# Patient Record
Sex: Male | Born: 1945
Health system: Southern US, Community
[De-identification: ages and names within clinical notes are randomized; demographics above are authoritative.]

## PROBLEM LIST (undated history)

## (undated) DIAGNOSIS — N200 Calculus of kidney: Secondary | ICD-10-CM

## (undated) DIAGNOSIS — E119 Type 2 diabetes mellitus without complications: Secondary | ICD-10-CM

## (undated) DIAGNOSIS — M545 Low back pain, unspecified: Secondary | ICD-10-CM

## (undated) DIAGNOSIS — M160 Bilateral primary osteoarthritis of hip: Secondary | ICD-10-CM

## (undated) DIAGNOSIS — N189 Chronic kidney disease, unspecified: Secondary | ICD-10-CM

## (undated) DIAGNOSIS — C801 Malignant (primary) neoplasm, unspecified: Secondary | ICD-10-CM

## (undated) DIAGNOSIS — IMO0001 Reserved for inherently not codable concepts without codable children: Secondary | ICD-10-CM

## (undated) DIAGNOSIS — N529 Male erectile dysfunction, unspecified: Secondary | ICD-10-CM

## (undated) DIAGNOSIS — I639 Cerebral infarction, unspecified: Secondary | ICD-10-CM

## (undated) DIAGNOSIS — D369 Benign neoplasm, unspecified site: Secondary | ICD-10-CM

## (undated) DIAGNOSIS — G8929 Other chronic pain: Secondary | ICD-10-CM

## (undated) DIAGNOSIS — J449 Chronic obstructive pulmonary disease, unspecified: Secondary | ICD-10-CM

## (undated) DIAGNOSIS — I480 Paroxysmal atrial fibrillation: Secondary | ICD-10-CM

## (undated) DIAGNOSIS — Z8546 Personal history of malignant neoplasm of prostate: Secondary | ICD-10-CM

## (undated) DIAGNOSIS — G629 Polyneuropathy, unspecified: Secondary | ICD-10-CM

## (undated) DIAGNOSIS — K5732 Diverticulitis of large intestine without perforation or abscess without bleeding: Secondary | ICD-10-CM

## (undated) DIAGNOSIS — E785 Hyperlipidemia, unspecified: Secondary | ICD-10-CM

## (undated) DIAGNOSIS — I872 Venous insufficiency (chronic) (peripheral): Secondary | ICD-10-CM

## (undated) DIAGNOSIS — N400 Enlarged prostate without lower urinary tract symptoms: Secondary | ICD-10-CM

## (undated) DIAGNOSIS — F1021 Alcohol dependence, in remission: Secondary | ICD-10-CM

## (undated) DIAGNOSIS — N281 Cyst of kidney, acquired: Secondary | ICD-10-CM

## (undated) DIAGNOSIS — K635 Polyp of colon: Secondary | ICD-10-CM

## (undated) DIAGNOSIS — R209 Unspecified disturbances of skin sensation: Secondary | ICD-10-CM

## (undated) DIAGNOSIS — R7302 Impaired glucose tolerance (oral): Secondary | ICD-10-CM

## (undated) DIAGNOSIS — M47817 Spondylosis without myelopathy or radiculopathy, lumbosacral region: Secondary | ICD-10-CM

## (undated) DIAGNOSIS — E1140 Type 2 diabetes mellitus with diabetic neuropathy, unspecified: Secondary | ICD-10-CM

## (undated) DIAGNOSIS — I10 Essential (primary) hypertension: Secondary | ICD-10-CM

## (undated) DIAGNOSIS — Z8679 Personal history of other diseases of the circulatory system: Secondary | ICD-10-CM

## (undated) HISTORY — DX: Type 2 diabetes mellitus without complications: E11.9

## (undated) HISTORY — DX: Benign neoplasm, unspecified site: D36.9

## (undated) HISTORY — DX: Low back pain: M54.5

## (undated) HISTORY — DX: Hyperlipidemia, unspecified: E78.5

## (undated) HISTORY — DX: Diverticulitis of large intestine without perforation or abscess without bleeding: K57.32

## (undated) HISTORY — DX: Polyneuropathy, unspecified: G62.9

## (undated) HISTORY — DX: Bilateral primary osteoarthritis of hip: M16.0

## (undated) HISTORY — DX: Benign prostatic hyperplasia without lower urinary tract symptoms: N40.0

## (undated) HISTORY — DX: Other chronic pain: G89.29

## (undated) HISTORY — DX: Polyp of colon: K63.5

## (undated) HISTORY — DX: Calculus of kidney: N20.0

## (undated) HISTORY — PX: PROSTATECTOMY: SHX69

## (undated) HISTORY — DX: Personal history of other diseases of the circulatory system: Z86.79

## (undated) HISTORY — DX: Essential (primary) hypertension: I10

## (undated) HISTORY — DX: Alcohol dependence, in remission: F10.21

## (undated) HISTORY — DX: Cyst of kidney, acquired: N28.1

## (undated) HISTORY — DX: Impaired glucose tolerance (oral): R73.02

## (undated) HISTORY — DX: Type 2 diabetes mellitus with diabetic neuropathy, unspecified: E11.40

## (undated) HISTORY — DX: Male erectile dysfunction, unspecified: N52.9

## (undated) HISTORY — DX: Unspecified disturbances of skin sensation: R20.9

## (undated) HISTORY — DX: Chronic obstructive pulmonary disease, unspecified: J44.9

## (undated) HISTORY — DX: Personal history of malignant neoplasm of prostate: Z85.46

## (undated) HISTORY — DX: Paroxysmal atrial fibrillation: I48.0

## (undated) HISTORY — DX: Reserved for inherently not codable concepts without codable children: IMO0001

## (undated) HISTORY — DX: Low back pain, unspecified: M54.50

## (undated) HISTORY — DX: Spondylosis without myelopathy or radiculopathy, lumbosacral region: M47.817

---

## 1998-01-24 ENCOUNTER — Ambulatory Visit (HOSPITAL_COMMUNITY): Admission: RE | Admit: 1998-01-24 | Discharge: 1998-01-24 | Payer: Self-pay | Admitting: Internal Medicine

## 1998-11-12 ENCOUNTER — Encounter: Payer: Self-pay | Admitting: Internal Medicine

## 1998-11-12 ENCOUNTER — Ambulatory Visit (HOSPITAL_COMMUNITY): Admission: RE | Admit: 1998-11-12 | Discharge: 1998-11-12 | Payer: Self-pay | Admitting: Internal Medicine

## 1999-12-27 ENCOUNTER — Encounter: Payer: Self-pay | Admitting: Internal Medicine

## 1999-12-27 ENCOUNTER — Ambulatory Visit (HOSPITAL_COMMUNITY): Admission: RE | Admit: 1999-12-27 | Discharge: 1999-12-27 | Payer: Self-pay | Admitting: Internal Medicine

## 1999-12-28 ENCOUNTER — Emergency Department (HOSPITAL_COMMUNITY): Admission: EM | Admit: 1999-12-28 | Discharge: 1999-12-28 | Payer: Self-pay | Admitting: Emergency Medicine

## 2000-01-02 ENCOUNTER — Encounter: Payer: Self-pay | Admitting: Urology

## 2000-01-02 ENCOUNTER — Ambulatory Visit (HOSPITAL_COMMUNITY): Admission: RE | Admit: 2000-01-02 | Discharge: 2000-01-02 | Payer: Self-pay | Admitting: Urology

## 2001-02-23 ENCOUNTER — Encounter: Payer: Self-pay | Admitting: Neurosurgery

## 2001-02-23 ENCOUNTER — Encounter: Admission: RE | Admit: 2001-02-23 | Discharge: 2001-02-23 | Payer: Self-pay | Admitting: Neurosurgery

## 2001-03-08 ENCOUNTER — Encounter: Payer: Self-pay | Admitting: Neurosurgery

## 2001-03-08 ENCOUNTER — Encounter: Admission: RE | Admit: 2001-03-08 | Discharge: 2001-03-08 | Payer: Self-pay | Admitting: Neurosurgery

## 2001-03-23 ENCOUNTER — Encounter: Payer: Self-pay | Admitting: Neurosurgery

## 2001-03-23 ENCOUNTER — Encounter: Admission: RE | Admit: 2001-03-23 | Discharge: 2001-03-23 | Payer: Self-pay | Admitting: Neurosurgery

## 2001-07-21 HISTORY — PX: LEFT HEART CATH AND CORONARY ANGIOGRAPHY: CATH118249

## 2001-07-21 HISTORY — PX: CARDIAC CATHETERIZATION: SHX172

## 2001-09-21 ENCOUNTER — Encounter: Payer: Self-pay | Admitting: Emergency Medicine

## 2001-09-21 ENCOUNTER — Inpatient Hospital Stay (HOSPITAL_COMMUNITY): Admission: EM | Admit: 2001-09-21 | Discharge: 2001-09-23 | Payer: Self-pay | Admitting: Emergency Medicine

## 2001-09-22 ENCOUNTER — Encounter: Payer: Self-pay | Admitting: Cardiovascular Disease

## 2004-05-27 ENCOUNTER — Ambulatory Visit: Payer: Self-pay | Admitting: Internal Medicine

## 2004-09-10 ENCOUNTER — Ambulatory Visit: Payer: Self-pay | Admitting: Internal Medicine

## 2004-10-04 ENCOUNTER — Encounter: Admission: RE | Admit: 2004-10-04 | Discharge: 2004-10-04 | Payer: Self-pay | Admitting: Neurosurgery

## 2005-05-20 ENCOUNTER — Ambulatory Visit: Payer: Self-pay | Admitting: Internal Medicine

## 2005-06-23 ENCOUNTER — Ambulatory Visit: Payer: Self-pay | Admitting: Pulmonary Disease

## 2005-07-03 ENCOUNTER — Ambulatory Visit (HOSPITAL_BASED_OUTPATIENT_CLINIC_OR_DEPARTMENT_OTHER): Admission: RE | Admit: 2005-07-03 | Discharge: 2005-07-03 | Payer: Self-pay | Admitting: Urology

## 2005-07-03 ENCOUNTER — Ambulatory Visit (HOSPITAL_COMMUNITY): Admission: RE | Admit: 2005-07-03 | Discharge: 2005-07-03 | Payer: Self-pay | Admitting: Urology

## 2005-07-03 ENCOUNTER — Encounter (INDEPENDENT_AMBULATORY_CARE_PROVIDER_SITE_OTHER): Payer: Self-pay | Admitting: Specialist

## 2005-09-17 ENCOUNTER — Inpatient Hospital Stay (HOSPITAL_COMMUNITY): Admission: RE | Admit: 2005-09-17 | Discharge: 2005-09-18 | Payer: Self-pay | Admitting: Urology

## 2005-09-17 ENCOUNTER — Encounter (INDEPENDENT_AMBULATORY_CARE_PROVIDER_SITE_OTHER): Payer: Self-pay | Admitting: Specialist

## 2006-04-18 ENCOUNTER — Emergency Department (HOSPITAL_COMMUNITY): Admission: EM | Admit: 2006-04-18 | Discharge: 2006-04-18 | Payer: Self-pay | Admitting: Emergency Medicine

## 2007-01-18 ENCOUNTER — Ambulatory Visit: Payer: Self-pay | Admitting: Internal Medicine

## 2007-01-18 LAB — CONVERTED CEMR LAB
ALT: 33 units/L (ref 0–53)
AST: 35 units/L (ref 0–37)
Albumin: 3.5 g/dL (ref 3.5–5.2)
Alkaline Phosphatase: 85 units/L (ref 39–117)
BUN: 9 mg/dL (ref 6–23)
Basophils Absolute: 0 10*3/uL (ref 0.0–0.1)
Basophils Relative: 0.7 % (ref 0.0–1.0)
Bilirubin Urine: NEGATIVE
Bilirubin, Direct: 0.1 mg/dL (ref 0.0–0.3)
CO2: 32 meq/L (ref 19–32)
Calcium: 9.2 mg/dL (ref 8.4–10.5)
Chloride: 109 meq/L (ref 96–112)
Cholesterol: 148 mg/dL (ref 0–200)
Creatinine, Ser: 1.1 mg/dL (ref 0.4–1.5)
Crystals: NEGATIVE
Eosinophils Absolute: 0.2 10*3/uL (ref 0.0–0.6)
Eosinophils Relative: 5.3 % — ABNORMAL HIGH (ref 0.0–5.0)
GFR calc Af Amer: 88 mL/min
GFR calc non Af Amer: 73 mL/min
Glucose, Bld: 108 mg/dL — ABNORMAL HIGH (ref 70–99)
HCT: 39.9 % (ref 39.0–52.0)
HDL: 41.4 mg/dL (ref >39.0)
Hemoglobin, Urine: NEGATIVE
Hemoglobin: 13.1 g/dL (ref 13.0–17.0)
Ketones, ur: NEGATIVE mg/dL
LDL Cholesterol: 95 mg/dL (ref 0–99)
Lymphocytes Relative: 37.3 % (ref 12.0–46.0)
MCHC: 32.9 g/dL (ref 30.0–36.0)
MCV: 86.7 fL (ref 78.0–100.0)
Monocytes Absolute: 0.7 10*3/uL (ref 0.2–0.7)
Monocytes Relative: 18.9 % — ABNORMAL HIGH (ref 3.0–11.0)
Mucus, UA: NEGATIVE
Neutro Abs: 1.3 10*3/uL — ABNORMAL LOW (ref 1.4–7.7)
Neutrophils Relative %: 37.8 % — ABNORMAL LOW (ref 43.0–77.0)
Nitrite: NEGATIVE
Platelets: 199 10*3/uL (ref 150–400)
Potassium: 4.4 meq/L (ref 3.5–5.1)
RBC / HPF: NONE SEEN
RBC: 4.6 M/uL (ref 4.22–5.81)
RDW: 13.4 % (ref 11.5–14.6)
Sodium: 142 meq/L (ref 135–145)
Specific Gravity, Urine: 1.015 (ref 1.000–1.03)
TSH: 0.82 microintl units/mL (ref 0.35–5.50)
Total Bilirubin: 0.6 mg/dL (ref 0.3–1.2)
Total CHOL/HDL Ratio: 3.6
Total Protein, Urine: NEGATIVE mg/dL
Total Protein: 8.2 g/dL (ref 6.0–8.3)
Triglycerides: 57 mg/dL (ref 0–149)
Urine Glucose: NEGATIVE mg/dL
Urobilinogen, UA: 0.2 (ref 0.0–1.0)
VLDL: 11 mg/dL (ref 0–40)
WBC: 3.5 10*3/uL — ABNORMAL LOW (ref 4.5–10.5)
pH: 7 (ref 5.0–8.0)

## 2007-01-19 DIAGNOSIS — E1140 Type 2 diabetes mellitus with diabetic neuropathy, unspecified: Secondary | ICD-10-CM

## 2007-01-19 HISTORY — DX: Type 2 diabetes mellitus with diabetic neuropathy, unspecified: E11.40

## 2007-02-10 ENCOUNTER — Ambulatory Visit: Payer: Self-pay | Admitting: Internal Medicine

## 2007-02-11 DIAGNOSIS — R209 Unspecified disturbances of skin sensation: Secondary | ICD-10-CM | POA: Insufficient documentation

## 2007-02-11 DIAGNOSIS — J449 Chronic obstructive pulmonary disease, unspecified: Secondary | ICD-10-CM | POA: Insufficient documentation

## 2007-02-11 DIAGNOSIS — I110 Hypertensive heart disease with heart failure: Secondary | ICD-10-CM | POA: Insufficient documentation

## 2007-02-11 DIAGNOSIS — I5032 Chronic diastolic (congestive) heart failure: Secondary | ICD-10-CM

## 2007-02-11 DIAGNOSIS — Z8546 Personal history of malignant neoplasm of prostate: Secondary | ICD-10-CM | POA: Insufficient documentation

## 2007-02-11 DIAGNOSIS — E1140 Type 2 diabetes mellitus with diabetic neuropathy, unspecified: Secondary | ICD-10-CM | POA: Insufficient documentation

## 2007-02-11 DIAGNOSIS — F411 Generalized anxiety disorder: Secondary | ICD-10-CM | POA: Insufficient documentation

## 2007-02-11 DIAGNOSIS — M545 Low back pain, unspecified: Secondary | ICD-10-CM | POA: Insufficient documentation

## 2007-02-11 DIAGNOSIS — E785 Hyperlipidemia, unspecified: Secondary | ICD-10-CM | POA: Insufficient documentation

## 2007-02-11 DIAGNOSIS — F1011 Alcohol abuse, in remission: Secondary | ICD-10-CM | POA: Insufficient documentation

## 2007-02-11 DIAGNOSIS — N400 Enlarged prostate without lower urinary tract symptoms: Secondary | ICD-10-CM | POA: Insufficient documentation

## 2007-04-22 ENCOUNTER — Ambulatory Visit: Payer: Self-pay | Admitting: Gastroenterology

## 2007-05-27 ENCOUNTER — Emergency Department (HOSPITAL_COMMUNITY): Admission: EM | Admit: 2007-05-27 | Discharge: 2007-05-27 | Payer: Self-pay | Admitting: Family Medicine

## 2007-05-31 LAB — HM COLONOSCOPY

## 2007-06-03 ENCOUNTER — Ambulatory Visit: Payer: Self-pay | Admitting: Internal Medicine

## 2007-06-03 DIAGNOSIS — Z87442 Personal history of urinary calculi: Secondary | ICD-10-CM | POA: Insufficient documentation

## 2007-06-03 DIAGNOSIS — M5137 Other intervertebral disc degeneration, lumbosacral region: Secondary | ICD-10-CM | POA: Insufficient documentation

## 2007-06-03 DIAGNOSIS — H60399 Other infective otitis externa, unspecified ear: Secondary | ICD-10-CM | POA: Insufficient documentation

## 2007-06-10 ENCOUNTER — Encounter: Payer: Self-pay | Admitting: Internal Medicine

## 2007-06-10 ENCOUNTER — Encounter: Payer: Self-pay | Admitting: Gastroenterology

## 2007-06-10 ENCOUNTER — Ambulatory Visit: Payer: Self-pay | Admitting: Gastroenterology

## 2007-06-10 DIAGNOSIS — D126 Benign neoplasm of colon, unspecified: Secondary | ICD-10-CM | POA: Insufficient documentation

## 2007-06-10 DIAGNOSIS — K5730 Diverticulosis of large intestine without perforation or abscess without bleeding: Secondary | ICD-10-CM | POA: Insufficient documentation

## 2007-06-10 DIAGNOSIS — K648 Other hemorrhoids: Secondary | ICD-10-CM | POA: Insufficient documentation

## 2007-06-28 ENCOUNTER — Telehealth: Payer: Self-pay | Admitting: Internal Medicine

## 2007-09-27 DIAGNOSIS — M129 Arthropathy, unspecified: Secondary | ICD-10-CM | POA: Insufficient documentation

## 2007-09-27 DIAGNOSIS — I517 Cardiomegaly: Secondary | ICD-10-CM | POA: Insufficient documentation

## 2007-09-27 DIAGNOSIS — J309 Allergic rhinitis, unspecified: Secondary | ICD-10-CM | POA: Insufficient documentation

## 2007-09-27 DIAGNOSIS — M6740 Ganglion, unspecified site: Secondary | ICD-10-CM | POA: Insufficient documentation

## 2007-09-27 DIAGNOSIS — I080 Rheumatic disorders of both mitral and aortic valves: Secondary | ICD-10-CM | POA: Insufficient documentation

## 2007-09-27 DIAGNOSIS — G5620 Lesion of ulnar nerve, unspecified upper limb: Secondary | ICD-10-CM | POA: Insufficient documentation

## 2007-09-27 DIAGNOSIS — F528 Other sexual dysfunction not due to a substance or known physiological condition: Secondary | ICD-10-CM | POA: Insufficient documentation

## 2008-01-19 ENCOUNTER — Telehealth: Payer: Self-pay | Admitting: Internal Medicine

## 2008-01-20 ENCOUNTER — Ambulatory Visit: Payer: Self-pay | Admitting: Internal Medicine

## 2008-01-20 LAB — CONVERTED CEMR LAB
BUN: 27 mg/dL — ABNORMAL HIGH (ref 6–23)
Basophils Absolute: 0 10*3/uL (ref 0.0–0.1)
Basophils Relative: 0.2 % (ref 0.0–1.0)
Bilirubin Urine: NEGATIVE
CO2: 24 meq/L (ref 19–32)
Calcium: 8.9 mg/dL (ref 8.4–10.5)
Chloride: 107 meq/L (ref 96–112)
Creatinine, Ser: 1.9 mg/dL — ABNORMAL HIGH (ref 0.4–1.5)
Crystals: NEGATIVE
Eosinophils Absolute: 0.1 10*3/uL (ref 0.0–0.7)
Eosinophils Relative: 1.1 % (ref 0.0–5.0)
GFR calc Af Amer: 47 mL/min
GFR calc non Af Amer: 38 mL/min
Glucose, Bld: 114 mg/dL — ABNORMAL HIGH (ref 70–99)
HCT: 33.5 % — ABNORMAL LOW (ref 39.0–52.0)
Hemoglobin: 11.6 g/dL — ABNORMAL LOW (ref 13.0–17.0)
Hgb A1c MFr Bld: 6.9 % — ABNORMAL HIGH (ref 4.6–6.0)
Ketones, ur: NEGATIVE mg/dL
Lymphocytes Relative: 12.1 % (ref 12.0–46.0)
MCHC: 34.6 g/dL (ref 30.0–36.0)
MCV: 85.4 fL (ref 78.0–100.0)
Monocytes Absolute: 1 10*3/uL (ref 0.1–1.0)
Monocytes Relative: 10.8 % (ref 3.0–12.0)
Mucus, UA: NEGATIVE
Neutro Abs: 6.8 10*3/uL (ref 1.4–7.7)
Neutrophils Relative %: 75.8 % (ref 43.0–77.0)
Nitrite: POSITIVE — AB
Platelets: 146 10*3/uL — ABNORMAL LOW (ref 150–400)
Potassium: 4.1 meq/L (ref 3.5–5.1)
RBC: 3.93 M/uL — ABNORMAL LOW (ref 4.22–5.81)
RDW: 13.1 % (ref 11.5–14.6)
Sodium: 137 meq/L (ref 135–145)
Specific Gravity, Urine: 1.025 (ref 1.000–1.03)
Total Protein, Urine: NEGATIVE mg/dL
Urine Glucose: NEGATIVE mg/dL
Urobilinogen, UA: 1 (ref 0.0–1.0)
WBC: 9 10*3/uL (ref 4.5–10.5)
pH: 6 (ref 5.0–8.0)

## 2008-01-21 ENCOUNTER — Ambulatory Visit: Payer: Self-pay | Admitting: Internal Medicine

## 2008-01-21 ENCOUNTER — Inpatient Hospital Stay (HOSPITAL_COMMUNITY): Admission: EM | Admit: 2008-01-21 | Discharge: 2008-01-24 | Payer: Self-pay | Admitting: Emergency Medicine

## 2008-01-21 ENCOUNTER — Encounter: Payer: Self-pay | Admitting: Internal Medicine

## 2008-01-24 ENCOUNTER — Telehealth (INDEPENDENT_AMBULATORY_CARE_PROVIDER_SITE_OTHER): Payer: Self-pay | Admitting: *Deleted

## 2008-02-03 ENCOUNTER — Ambulatory Visit: Payer: Self-pay | Admitting: Internal Medicine

## 2008-02-03 DIAGNOSIS — N390 Urinary tract infection, site not specified: Secondary | ICD-10-CM | POA: Insufficient documentation

## 2008-02-03 LAB — CONVERTED CEMR LAB
Bilirubin Urine: NEGATIVE
Crystals: NEGATIVE
Hemoglobin, Urine: NEGATIVE
Ketones, ur: NEGATIVE mg/dL
Leukocytes, UA: NEGATIVE
Mucus, UA: NEGATIVE
Nitrite: NEGATIVE
RBC / HPF: NONE SEEN
Specific Gravity, Urine: >=1.03 (ref 1.000–1.03)
Total Protein, Urine: NEGATIVE mg/dL
Urine Glucose: NEGATIVE mg/dL
Urobilinogen, UA: 0.2 (ref 0.0–1.0)
pH: 5.5 (ref 5.0–8.0)

## 2008-02-04 ENCOUNTER — Encounter: Payer: Self-pay | Admitting: Internal Medicine

## 2008-03-15 ENCOUNTER — Encounter: Payer: Self-pay | Admitting: Internal Medicine

## 2008-05-21 DIAGNOSIS — D369 Benign neoplasm, unspecified site: Secondary | ICD-10-CM

## 2008-05-21 HISTORY — DX: Benign neoplasm, unspecified site: D36.9

## 2008-08-08 ENCOUNTER — Ambulatory Visit: Payer: Self-pay | Admitting: Internal Medicine

## 2008-08-08 DIAGNOSIS — Z860100 Personal history of colon polyps, unspecified: Secondary | ICD-10-CM | POA: Insufficient documentation

## 2008-08-08 LAB — CONVERTED CEMR LAB
ALT: 25 units/L (ref 0–53)
AST: 35 units/L (ref 0–37)
Albumin: 3.6 g/dL (ref 3.5–5.2)
Alkaline Phosphatase: 92 units/L (ref 39–117)
BUN: 10 mg/dL (ref 6–23)
Basophils Absolute: 0.1 10*3/uL (ref 0.0–0.1)
Basophils Relative: 1.3 % (ref 0.0–3.0)
Bilirubin Urine: NEGATIVE
Bilirubin, Direct: 0.1 mg/dL (ref 0.0–0.3)
CO2: 29 meq/L (ref 19–32)
Calcium: 9.3 mg/dL (ref 8.4–10.5)
Chloride: 107 meq/L (ref 96–112)
Cholesterol: 126 mg/dL (ref 0–200)
Creatinine, Ser: 1.3 mg/dL (ref 0.4–1.5)
Eosinophils Absolute: 0.2 10*3/uL (ref 0.0–0.7)
Eosinophils Relative: 3.6 % (ref 0.0–5.0)
GFR calc Af Amer: 72 mL/min
GFR calc non Af Amer: 60 mL/min
Glucose, Bld: 118 mg/dL — ABNORMAL HIGH (ref 70–99)
HCT: 37 % — ABNORMAL LOW (ref 39.0–52.0)
HDL: 43.8 mg/dL (ref >39.0)
Hemoglobin, Urine: NEGATIVE
Hemoglobin: 12.3 g/dL — ABNORMAL LOW (ref 13.0–17.0)
LDL Cholesterol: 74 mg/dL (ref 0–99)
Leukocytes, UA: NEGATIVE
Lymphocytes Relative: 41.9 % (ref 12.0–46.0)
MCHC: 33.3 g/dL (ref 30.0–36.0)
MCV: 87.1 fL (ref 78.0–100.0)
Monocytes Absolute: 0.5 10*3/uL (ref 0.1–1.0)
Monocytes Relative: 11.5 % (ref 3.0–12.0)
Neutro Abs: 1.8 10*3/uL (ref 1.4–7.7)
Neutrophils Relative %: 41.7 % — ABNORMAL LOW (ref 43.0–77.0)
Nitrite: NEGATIVE
PSA: 0.01 ng/mL — ABNORMAL LOW (ref 0.10–4.00)
Platelets: 198 10*3/uL (ref 150–400)
Potassium: 4.4 meq/L (ref 3.5–5.1)
RBC: 4.25 M/uL (ref 4.22–5.81)
RDW: 13.2 % (ref 11.5–14.6)
Sodium: 139 meq/L (ref 135–145)
Specific Gravity, Urine: 1.025 (ref 1.000–1.03)
TSH: 1.08 microintl units/mL (ref 0.35–5.50)
Total Bilirubin: 0.6 mg/dL (ref 0.3–1.2)
Total CHOL/HDL Ratio: 2.9
Total Protein, Urine: NEGATIVE mg/dL
Total Protein: 8.4 g/dL — ABNORMAL HIGH (ref 6.0–8.3)
Triglycerides: 42 mg/dL (ref 0–149)
Urine Glucose: NEGATIVE mg/dL
Urobilinogen, UA: 0.2 (ref 0.0–1.0)
VLDL: 8 mg/dL (ref 0–40)
WBC: 4.3 10*3/uL — ABNORMAL LOW (ref 4.5–10.5)
pH: 5.5 (ref 5.0–8.0)

## 2008-10-09 ENCOUNTER — Encounter: Payer: Self-pay | Admitting: Internal Medicine

## 2008-10-25 ENCOUNTER — Telehealth (INDEPENDENT_AMBULATORY_CARE_PROVIDER_SITE_OTHER): Payer: Self-pay | Admitting: *Deleted

## 2008-10-27 ENCOUNTER — Telehealth: Payer: Self-pay | Admitting: Internal Medicine

## 2008-11-17 ENCOUNTER — Encounter: Payer: Self-pay | Admitting: Internal Medicine

## 2009-05-21 ENCOUNTER — Encounter: Payer: Self-pay | Admitting: Internal Medicine

## 2009-05-23 ENCOUNTER — Encounter: Payer: Self-pay | Admitting: Internal Medicine

## 2009-06-06 ENCOUNTER — Encounter: Payer: Self-pay | Admitting: Internal Medicine

## 2009-08-27 ENCOUNTER — Ambulatory Visit: Payer: Self-pay | Admitting: Internal Medicine

## 2009-08-27 ENCOUNTER — Encounter (INDEPENDENT_AMBULATORY_CARE_PROVIDER_SITE_OTHER): Payer: Self-pay | Admitting: *Deleted

## 2009-08-27 DIAGNOSIS — M25569 Pain in unspecified knee: Secondary | ICD-10-CM | POA: Insufficient documentation

## 2009-08-27 LAB — CONVERTED CEMR LAB
ALT: 24 units/L (ref 0–53)
AST: 30 units/L (ref 0–37)
Albumin: 3.7 g/dL (ref 3.5–5.2)
Alkaline Phosphatase: 103 units/L (ref 39–117)
BUN: 16 mg/dL (ref 6–23)
Basophils Absolute: 0.1 10*3/uL (ref 0.0–0.1)
Basophils Relative: 1.6 % (ref 0.0–3.0)
Bilirubin Urine: NEGATIVE
Bilirubin, Direct: 0.1 mg/dL (ref 0.0–0.3)
CO2: 29 meq/L (ref 19–32)
Calcium: 9.2 mg/dL (ref 8.4–10.5)
Chloride: 105 meq/L (ref 96–112)
Cholesterol: 141 mg/dL (ref 0–200)
Creatinine, Ser: 1.3 mg/dL (ref 0.4–1.5)
Creatinine,U: 153.6 mg/dL
Eosinophils Absolute: 0.1 10*3/uL (ref 0.0–0.7)
Eosinophils Relative: 2.7 % (ref 0.0–5.0)
Folate: 8.3 ng/mL
GFR calc non Af Amer: 71.77 mL/min (ref >60)
Glucose, Bld: 101 mg/dL — ABNORMAL HIGH (ref 70–99)
HCT: 40.3 % (ref 39.0–52.0)
HDL: 52.6 mg/dL (ref >39.00)
Hemoglobin, Urine: NEGATIVE
Hemoglobin: 13.1 g/dL (ref 13.0–17.0)
Hgb A1c MFr Bld: 6.7 % — ABNORMAL HIGH (ref 4.6–6.5)
Ketones, ur: NEGATIVE mg/dL
LDL Cholesterol: 81 mg/dL (ref 0–99)
Leukocytes, UA: NEGATIVE
Lymphocytes Relative: 37.5 % (ref 12.0–46.0)
Lymphs Abs: 1.4 10*3/uL (ref 0.7–4.0)
MCHC: 32.4 g/dL (ref 30.0–36.0)
MCV: 89.3 fL (ref 78.0–100.0)
Microalb Creat Ratio: 9.1 mg/g (ref 0.0–30.0)
Microalb, Ur: 1.4 mg/dL (ref 0.0–1.9)
Monocytes Absolute: 0.5 10*3/uL (ref 0.1–1.0)
Monocytes Relative: 13.4 % — ABNORMAL HIGH (ref 3.0–12.0)
Neutro Abs: 1.7 10*3/uL (ref 1.4–7.7)
Neutrophils Relative %: 44.8 % (ref 43.0–77.0)
Nitrite: NEGATIVE
Platelets: 201 10*3/uL (ref 150.0–400.0)
Potassium: 4.1 meq/L (ref 3.5–5.1)
RBC: 4.52 M/uL (ref 4.22–5.81)
RDW: 14 % (ref 11.5–14.6)
Sodium: 138 meq/L (ref 135–145)
Specific Gravity, Urine: 1.02 (ref 1.000–1.030)
TSH: 0.38 microintl units/mL (ref 0.35–5.50)
Total Bilirubin: 0.6 mg/dL (ref 0.3–1.2)
Total CHOL/HDL Ratio: 3
Total Protein, Urine: NEGATIVE mg/dL
Total Protein: 9.5 g/dL — ABNORMAL HIGH (ref 6.0–8.3)
Triglycerides: 39 mg/dL (ref 0.0–149.0)
Urine Glucose: NEGATIVE mg/dL
Urobilinogen, UA: 0.2 (ref 0.0–1.0)
VLDL: 7.8 mg/dL (ref 0.0–40.0)
Vitamin B-12: 398 pg/mL (ref 211–911)
WBC: 3.8 10*3/uL — ABNORMAL LOW (ref 4.5–10.5)
pH: 5.5 (ref 5.0–8.0)

## 2009-08-28 LAB — CONVERTED CEMR LAB: Vit D, 25-Hydroxy: 14 ng/mL — ABNORMAL LOW (ref 30–89)

## 2009-09-11 ENCOUNTER — Encounter: Payer: Self-pay | Admitting: Internal Medicine

## 2009-11-21 ENCOUNTER — Encounter: Payer: Self-pay | Admitting: Internal Medicine

## 2009-11-30 ENCOUNTER — Telehealth: Payer: Self-pay | Admitting: Internal Medicine

## 2009-12-13 ENCOUNTER — Telehealth: Payer: Self-pay | Admitting: Internal Medicine

## 2010-01-15 ENCOUNTER — Encounter: Payer: Self-pay | Admitting: Internal Medicine

## 2010-01-24 ENCOUNTER — Telehealth: Payer: Self-pay | Admitting: Internal Medicine

## 2010-01-31 ENCOUNTER — Ambulatory Visit: Payer: Self-pay | Admitting: Internal Medicine

## 2010-02-25 ENCOUNTER — Telehealth: Payer: Self-pay | Admitting: Internal Medicine

## 2010-03-07 ENCOUNTER — Telehealth: Payer: Self-pay | Admitting: Internal Medicine

## 2010-05-02 ENCOUNTER — Telehealth: Payer: Self-pay | Admitting: Internal Medicine

## 2010-05-17 ENCOUNTER — Encounter: Payer: Self-pay | Admitting: Internal Medicine

## 2010-05-29 ENCOUNTER — Encounter: Payer: Self-pay | Admitting: Internal Medicine

## 2010-07-05 ENCOUNTER — Encounter
Admission: RE | Admit: 2010-07-05 | Discharge: 2010-07-05 | Payer: Self-pay | Source: Home / Self Care | Attending: Orthopaedic Surgery | Admitting: Orthopaedic Surgery

## 2010-07-08 ENCOUNTER — Encounter: Payer: Self-pay | Admitting: Internal Medicine

## 2010-08-05 ENCOUNTER — Encounter: Payer: Self-pay | Admitting: Internal Medicine

## 2010-08-12 ENCOUNTER — Telehealth: Payer: Self-pay | Admitting: Internal Medicine

## 2010-08-20 LAB — CBC
HCT: 39 % (ref 39.0–52.0)
Hemoglobin: 12.5 g/dL — ABNORMAL LOW (ref 13.0–17.0)
MCH: 27.2 pg (ref 26.0–34.0)
MCHC: 32.1 g/dL (ref 30.0–36.0)
MCV: 84.8 fL (ref 78.0–100.0)
Platelets: 222 10*3/uL (ref 150–400)
RBC: 4.6 MIL/uL (ref 4.22–5.81)
RDW: 13.6 % (ref 11.5–15.5)
WBC: 4.2 10*3/uL (ref 4.0–10.5)

## 2010-08-20 LAB — COMPREHENSIVE METABOLIC PANEL
ALT: 21 U/L (ref 0–53)
AST: 26 U/L (ref 0–37)
Albumin: 3.6 g/dL (ref 3.5–5.2)
Alkaline Phosphatase: 94 U/L (ref 39–117)
BUN: 10 mg/dL (ref 6–23)
CO2: 26 mEq/L (ref 19–32)
Calcium: 9.3 mg/dL (ref 8.4–10.5)
Chloride: 105 mEq/L (ref 96–112)
Creatinine, Ser: 1.22 mg/dL (ref 0.4–1.5)
GFR calc Af Amer: >60 mL/min (ref >60)
GFR calc non Af Amer: 60 mL/min — ABNORMAL LOW (ref >60)
Glucose, Bld: 93 mg/dL (ref 70–99)
Potassium: 4.4 mEq/L (ref 3.5–5.1)
Sodium: 137 mEq/L (ref 135–145)
Total Bilirubin: 0.4 mg/dL (ref 0.3–1.2)
Total Protein: 8 g/dL (ref 6.0–8.3)

## 2010-08-20 LAB — URINALYSIS, ROUTINE W REFLEX MICROSCOPIC
Bilirubin Urine: NEGATIVE
Hgb urine dipstick: NEGATIVE
Ketones, ur: NEGATIVE mg/dL
Nitrite: NEGATIVE
Protein, ur: NEGATIVE mg/dL
Specific Gravity, Urine: 1.007 (ref 1.005–1.030)
Urine Glucose, Fasting: NEGATIVE mg/dL
Urobilinogen, UA: 0.2 mg/dL (ref 0.0–1.0)
pH: 6.5 (ref 5.0–8.0)

## 2010-08-20 LAB — DIFFERENTIAL
Basophils Absolute: 0 10*3/uL (ref 0.0–0.1)
Basophils Relative: 0 % (ref 0–1)
Eosinophils Absolute: 0.2 10*3/uL (ref 0.0–0.7)
Eosinophils Relative: 4 % (ref 0–5)
Lymphocytes Relative: 41 % (ref 12–46)
Lymphs Abs: 1.7 10*3/uL (ref 0.7–4.0)
Monocytes Absolute: 0.6 10*3/uL (ref 0.1–1.0)
Monocytes Relative: 15 % — ABNORMAL HIGH (ref 3–12)
Neutro Abs: 1.7 10*3/uL (ref 1.7–7.7)
Neutrophils Relative %: 40 % — ABNORMAL LOW (ref 43–77)

## 2010-08-20 LAB — SURGICAL PCR SCREEN
MRSA, PCR: NEGATIVE
Staphylococcus aureus: NEGATIVE

## 2010-08-20 LAB — ABO/RH: ABO/RH(D): O POS

## 2010-08-20 LAB — PROTIME-INR
INR: 0.96 (ref 0.00–1.49)
Prothrombin Time: 13 seconds (ref 11.6–15.2)

## 2010-08-20 LAB — TYPE AND SCREEN
ABO/RH(D): O POS
Antibody Screen: NEGATIVE

## 2010-08-20 NOTE — Assessment & Plan Note (Signed)
Summary: KNEE PROBLEM /NWS  #   Vital Signs:  Patient profile:   65 year old male Height:      73 inches Weight:      249 pounds BMI:     32.97 O2 Sat:      98 % on Room air Temp:     97.5 degrees F oral Pulse rate:   62 / minute BP sitting:   140 / 70  (left arm) Cuff size:   large  Vitals Entered ByShirlean Mylar Ewing (August 27, 2009 10:16 AM)  O2 Flow:  Room air  Preventive Care Screening  Last Flu Shot:    Date:  04/20/2009    Results:  given   CC: Knee and foot pain/RE   CC:  Knee and foot pain/RE.  History of Present Illness: has ongoing chronic LBP and is nonsurgical after several surg evaluations, and no surgury unless gets worse;  does also mention bilat knee pain, left more than right getting worse in the last wk without fever, trauma, falls;  wears his own knee brace on the left at work; stands more than sits at work on concrete, sometimes severe  but "has learned to live with it" and pain takes an hour to get so back he has to sit ; with the brace he can stand longer; no click or catch and no recent swelling or falls;  at normal speed can walk fairly farl has seen ortho in [place] and told he has DJD knees that will get worse in the future;  no pain to the left knee sitting and did not work yesterday adn was able to sit;  also with  bilat feet problem that does not seem related to his back as he has been suggested in the past;  discomfort is not "pain" but a doscomfort hard to characterize but assoc with the ends of both feet to mid foot with some numbness, does not keep from sleep, and denies change in gait due to this; Pt denies CP, sob, doe, wheezing, orthopnea, pnd, worsening LE edema, palps, dizziness or syncope   Pt denies new neuro symptoms such as headache, facial or extremity weakness Pt denies polydipsia, polyuria, or low sugar symptoms such as shakiness improved with eating.  Overall good compliance with meds, trying to follow low chol, DM diet, wt stable, little  excercise however   Problems Prior to Update: 1)  Knee Pain, Left  (ICD-719.46) 2)  Paresthesia  (ICD-782.0) 3)  Preventive Health Care  (ICD-V70.0) 4)  Colonic Polyps, Hx of  (ICD-V12.72) 5)  Uti  (ICD-599.0) 6)  Polyp, Colon  (ICD-211.3) 7)  Hemorrhoids, Internal  (ICD-455.0) 8)  Arthritis  (ICD-716.90) 9)  Erectile Dysfunction  (ICD-302.72) 10)  Ganglion Cyst, Wrist, Left  (ICD-727.41) 11)  Ulnar Neuropathy  (ICD-354.2) 12)  Ventricular Hypertrophy, Left  (ICD-429.3) 13)  Mitral Regurgitation, 0 (MILD)  (ICD-396.3) 14)  Allergic Rhinitis  (ICD-477.9) 15)  Nephrolithiasis, Hx of  (ICD-V13.01) 16)  Alcohol Abuse, in Remission  (ICD-305.03) 17)  Disc Disease, Lumbar  (ICD-722.52) 18)  Otitis Externa, Right  (ICD-380.10) 19)  Family History of Cad Male 1st Degree Relative <50  (ICD-V17.3) 20)  Family History of Cad Male 1st Degree Relative <60  (ICD-V16.49) 21)  Prostate Cancer, Hx of  (ICD-V10.46) 22)  Abuse, Alcohol, in Remission  (ICD-305.03) 23)  Diverticulosis, Colon  (ICD-562.10) 24)  Symptom, Disturbance of Skin Sensation  (ICD-782.0) 25)  Benign Prostatic Hypertrophy  (ICD-600.00) 26)  Low Back  Pain  (ICD-724.2) 27)  Hypertension  (ICD-401.9) 28)  Diabetes Mellitus, Type II  (ICD-250.00) 29)  Anxiety  (ICD-300.00) 30)  Hyperlipidemia  (ICD-272.4) 31)  COPD  (ICD-496)  Medications Prior to Update: 1)  Albuterol 90 Mcg/act Aers (Albuterol) .... Inhale 2 Puff Using Inhaler Every Twelve Hours 2)  Ecotrin Low Strength 81 Mg  Tbec (Aspirin) .Marland Kitchen.. 1po Qd 3)  Pravachol 40 Mg  Tabs (Pravastatin Sodium) .Marland Kitchen.. 1 By Mouth Qd 4)  Hydrochlorothiazide 25 Mg  Tabs (Hydrochlorothiazide) .... Take 1 Tab By Mouth Every Morning For 5 Days Per Week 5)  Lotensin 40 Mg  Tabs (Benazepril Hcl) .Marland Kitchen.. 1 By Mouth Bid 6)  Pindolol 5 Mg  Tabs (Pindolol) .... 1/2 By Mouth Bid 7)  Cardizem La 180 Mg  Tb24 (Diltiazem Hcl Coated Beads) .Marland Kitchen.. 1 By Mouth Bid 8)  Omeprazole 20 Mg  Cpdr (Omeprazole) .Marland Kitchen.. 1  By Mouth Once Daily Prn 9)  Tramadol Hcl 50 Mg  Tabs (Tramadol Hcl) .... 2 By Mouth Qid As Needed  Current Medications (verified): 1)  Albuterol 90 Mcg/act Aers (Albuterol) .... Inhale 2 Puff Using Inhaler Every Twelve Hours 2)  Ecotrin Low Strength 81 Mg  Tbec (Aspirin) .Marland Kitchen.. 1po Qd 3)  Pravachol 40 Mg  Tabs (Pravastatin Sodium) .Marland Kitchen.. 1 By Mouth Once Daily 4)  Hydrochlorothiazide 25 Mg  Tabs (Hydrochlorothiazide) .... Take 1 Tab By Mouth Every Morning For 5 Days Per Week 5)  Lotensin 40 Mg  Tabs (Benazepril Hcl) .Marland Kitchen.. 1 By Mouth Two Times A Day 6)  Pindolol 5 Mg  Tabs (Pindolol) .... 1/2 By Mouth Two Times A Day 7)  Cardizem La 180 Mg  Tb24 (Diltiazem Hcl Coated Beads) .Marland Kitchen.. 1 By Mouth Two Times A Day 8)  Omeprazole 20 Mg  Cpdr (Omeprazole) .Marland Kitchen.. 1 By Mouth Once Daily As Needed 9)  Hydrocodone-Acetaminophen 5-325 Mg Tabs (Hydrocodone-Acetaminophen) .Marland Kitchen.. 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 hx of UTI 7/09 Colonic polyps, hx of - adenoma 11/09  Review of Systems  The patient denies anorexia, fever, weight loss, weight gain, vision  loss, decreased hearing, hoarseness, chest pain, syncope, dyspnea on exertion, peripheral edema, prolonged cough, headaches, hemoptysis, abdominal pain, melena, hematochezia, severe indigestion/heartburn, hematuria, incontinence, muscle weakness, suspicious skin lesions, transient blindness, difficulty walking, depression, unusual weight change, abnormal bleeding, enlarged lymph nodes, and angioedema.         all otherwise negative per pt -  - sees urology every 6  mo  Physical Exam  General:  alert and overweight-appearing.   Head:  normocephalic and atraumatic.   Eyes:  vision grossly intact, pupils equal, and pupils round.   Ears:  R ear normal and L ear normal.   Nose:  no external deformity and no nasal  discharge.   Mouth:  no gingival abnormalities and pharynx pink and moist.   Neck:  supple and no masses.   Lungs:  normal respiratory effort and normal breath sounds.   Heart:  normal rate and regular rhythm.   Abdomen:  soft, non-tender, and normal bowel sounds.   Msk:  left knee 1+ effusion, right knee no effusion, no tender bilat adn FROM Extremities:  no edema, no erythema  Neurologic:  cranial nerves II-XII intact, strength normal in all extremities, and sensation intact to light touch.     Impression & Recommendations:  Problem # 1:  Preventive Health Care (ICD-V70.0)  Overall doing well, age appropriate education and counseling updated and referral for appropriate preventive services done unless declined, immunizations up to date or declined, diet counseling done if overweight, urged to quit smoking if smokes , most recent labs reviewed and current ordered if appropriate, ecg reviewed or declined (interpretation per ECG scanned in the EMR if done); information regarding Medicare Prevention requirements given if appropriate   Orders: T-Vitamin D (25-Hydroxy) [id number]) TLB-BMP (Basic Metabolic Panel-BMET) (99991111) TLB-CBC Platelet - w/Differential  (85025-CBCD) TLB-Hepatic/Liver Function Pnl (80076-HEPATIC) TLB-Lipid Panel (80061-LIPID) TLB-TSH (Thyroid Stimulating Hormone) (84443-TSH) TLB-Udip ONLY (81003-UDIP)  Problem # 2:  PARESTHESIA (ICD-782.0)  declines emg, ncs for now, but will check b12 level - likely has mild distal neuropathy - ? due to dm vs other  Orders: TLB-B12 + Folate Pnl (82746_82607-B12/FOL)  Problem # 3:  DIABETES MELLITUS, TYPE II (ICD-250.00)  His updated medication list for this problem includes:    Ecotrin Low Strength 81 Mg Tbec (Aspirin) .Marland Kitchen... 1po qd    Lotensin 40 Mg Tabs (Benazepril hcl) .Marland Kitchen... 1 by mouth two times a day    His updated medication list for this problem includes:    Ecotrin Low Strength 81 Mg Tbec (Aspirin) .Marland Kitchen... 1po qd    Lotensin 40 Mg Tabs (Benazepril hcl) .Marland Kitchen... 1 by mouth bid  Orders: TLB-Microalbumin/Creat Ratio, Urine (82043-MALB) TLB-A1C / Hgb A1C (Glycohemoglobin) (83036-A1C) stable overall by hx and exam, ok to continue meds/tx as is , Pt to cont DM diet, excercise, wt loss efforts; to check labs today   Problem # 4:  KNEE PAIN, LEFT (ICD-719.46)  His updated medication list for this problem includes:    Ecotrin Low Strength 81 Mg Tbec (Aspirin) .Marland Kitchen... 1po qd    Hydrocodone-acetaminophen 5-325 Mg Tabs (Hydrocodone-acetaminophen) .Marland Kitchen... 1 - 2 by mouth two times a day as needed intermittent severe  - ok to change to vicodin as needed hopefully short term, refer ortho  Orders: Orthopedic Surgeon Referral (Ortho Surgeon)  Complete Medication List: 1)  Albuterol 90 Mcg/act Aers (Albuterol) .... Inhale 2 puff using inhaler every twelve hours 2)  Ecotrin Low Strength 81 Mg Tbec (Aspirin) .Marland Kitchen.. 1po qd 3)  Pravachol 40 Mg Tabs (Pravastatin sodium) .Marland Kitchen.. 1 by mouth once daily 4)  Hydrochlorothiazide 25 Mg Tabs (Hydrochlorothiazide) .... Take 1 tab by mouth every morning for 5 days per week 5)  Lotensin 40 Mg Tabs (Benazepril hcl) .Marland Kitchen.. 1 by mouth two times a day 6)  Pindolol 5  Mg Tabs (Pindolol) .... 1/2 by mouth two times a day 7)  Cardizem La 180 Mg Tb24 (Diltiazem hcl coated beads) .Marland Kitchen.. 1 by mouth two times a day 8)  Omeprazole 20 Mg Cpdr (Omeprazole) .Marland Kitchen.. 1 by mouth once daily as needed 9)  Hydrocodone-acetaminophen 5-325 Mg Tabs (Hydrocodone-acetaminophen) .Marland Kitchen.. 1 - 2 by mouth two times a day as needed  Patient Instructions:  1)  Please take all new medications as prescribed  2)  stop the tramadol as needed  3)  Continue all previous medications as before this visit  4)  Please go to the Lab in the basement for your blood and/or urine tests today 5)  You will be contacted about the referral(s) to: Orthopedic 6)  Please schedule a follow-up appointment in 6 months with: 7)  BMP prior to visit, ICD-9: 250.02 8)  Lipid Panel prior to visit, ICD-9: 9)  HbgA1C prior to visit, ICD-9: Prescriptions: ALBUTEROL 90 MCG/ACT AERS (ALBUTEROL) Inhale 2 puff using inhaler every twelve hours  #1 x 11   Entered and Authorized by:   Biagio Borg MD   Signed by:   Biagio Borg MD on 08/27/2009   Method used:   Print then Give to Patient   RxID:   [id number] OMEPRAZOLE 20 MG  CPDR (OMEPRAZOLE) 1 by mouth once daily as needed  #90 x 3   Entered and Authorized by:   Biagio Borg MD   Signed by:   Biagio Borg MD on 08/27/2009   Method used:   Print then Give to Patient   RxID:   [id number] CARDIZEM LA 180 MG  TB24 (DILTIAZEM HCL COATED BEADS) 1 by mouth two times a day  #180 x 3   Entered and Authorized by:   Biagio Borg MD   Signed by:   Biagio Borg MD on 08/27/2009   Method used:   Print then Give to Patient   [id number] PINDOLOL 5 MG  TABS (PINDOLOL) 1/2 by mouth two times a day  #90 x 3   Entered and Authorized by:   Biagio Borg MD   Signed by:   Biagio Borg MD on 08/27/2009   Method used:   Print then Give to Patient   RxID:   [id number] LOTENSIN 40 MG  TABS (BENAZEPRIL HCL) 1 by mouth two times a day  #180 x 3   Entered and  Authorized by:   Biagio Borg MD   Signed by:   Biagio Borg MD on 08/27/2009   Method used:   Print then Give to Patient   [id number] HYDROCHLOROTHIAZIDE 25 MG  TABS (HYDROCHLOROTHIAZIDE) Take 1 tab by mouth every morning for 5 days per week  #100 x 3   Entered and Authorized by:   Biagio Borg MD   Signed by:   Biagio Borg MD on 08/27/2009   Method used:   Print then Give to Patient   RxID:   [id number] PRAVACHOL 40 MG  TABS (PRAVASTATIN SODIUM) 1 by mouth once daily  #90 x 3   Entered and Authorized by:   Biagio Borg MD   Signed by:   Biagio Borg MD on 08/27/2009   Method used:   Print then Give to Patient   RxID:   [id number] HYDROCODONE-ACETAMINOPHEN 5-325 MG TABS (HYDROCODONE-ACETAMINOPHEN) 1 - 2 by mouth two times a day as needed  #60 x 1   Entered and Authorized by:   Biagio Borg MD   Signed by:   Biagio Borg MD on 08/27/2009   Method used:   Print then Give to Patient   RxID:   [id number]

## 2010-08-20 NOTE — Letter (Signed)
Summary: [facility]  [facility]   Imported By: Phillis Knack 05/30/2009 11:44:26  _____________________________________________________________________  External Attachment:    Type:   Image     Comment:   External Document

## 2010-08-20 NOTE — Assessment & Plan Note (Signed)
Summary: breathing problems-lb   Vital Signs:  Patient profile:   65 year old male Height:      73 inches Weight:      255.50 pounds BMI:     33.83 O2 Sat:      98 % on Room air Temp:     99.1 degrees F oral Pulse rate:   62 / minute BP sitting:   132 / 64  (left arm) Cuff size:   large  Vitals Entered By: Shirlean Mylar Ewing CMA Deborra Medina) (January 31, 2010 9:21 AM)  O2 Flow:  Room air  CC: Breathing problems/RE   CC:  Breathing problems/RE.  History of Present Illness: overall does well most of the time except for some dyspnea with going up stairs that has seemed minor to him in the past year; although still has episodic times of sob/doe that has tempted him to increase his inhaler use, which seems to help;  episodes can make him want oxygen in that he can get quite nervous, seems as if he "breathing through a straw", can last several hours at a time, can happen a few times per wk;  does a lot walking on the job and not yet slowed him down;  SOB/doe better with rest;  not aware of any fever today or yesterday, dneies fever, ST, CP, cough,  not sure if he has been wheezing per se, per pt.    just saw cardiologist 1.5 wks ago - seemed good from CV standpoint;  BP stable  for many months, Pt denies CP, orthopnea, pnd, worsening LE edema, palps, dizziness or syncope   Wt has increasaed 25 lbs in the past 2 yrs. Pt denies new neuro symptoms such as headache, facial or extremity weakness  No fever, wt loss, night sweats, or other constitutional symptoms    Still with chronic pain due to back and knees,  no change. Pt denies new neuro symptoms such as headache, facial or extremity weakness Pt denies polydipsia, polyuria, or low sugar symptoms such as shakiness improved with eating.  Overall good compliance with meds, trying to follow low chol, DM diet, wt stable, little excercise however    Problems Prior to Update: 1)  Knee Pain, Left  (ICD-719.46) 2)  Paresthesia  (ICD-782.0) 3)  Preventive Health  Care  (ICD-V70.0) 4)  Colonic Polyps, Hx of  (ICD-V12.72) 5)  Uti  (ICD-599.0) 6)  Polyp, Colon  (ICD-211.3) 7)  Hemorrhoids, Internal  (ICD-455.0) 8)  Arthritis  (ICD-716.90) 9)  Erectile Dysfunction  (ICD-302.72) 10)  Ganglion Cyst, Wrist, Left  (ICD-727.41) 11)  Ulnar Neuropathy  (ICD-354.2) 12)  Ventricular Hypertrophy, Left  (ICD-429.3) 13)  Mitral Regurgitation, 0 (MILD)  (ICD-396.3) 14)  Allergic Rhinitis  (ICD-477.9) 15)  Nephrolithiasis, Hx of  (ICD-V13.01) 16)  Alcohol Abuse, in Remission  (ICD-305.03) 17)  Disc Disease, Lumbar  (ICD-722.52) 18)  Otitis Externa, Right  (ICD-380.10) 19)  Family History of Cad Male 1st Degree Relative <50  (ICD-V17.3) 20)  Family History of Cad Male 1st Degree Relative <60  (ICD-V16.49) 21)  Prostate Cancer, Hx of  (ICD-V10.46) 22)  Abuse, Alcohol, in Remission  (ICD-305.03) 23)  Diverticulosis, Colon  (ICD-562.10) 24)  Symptom, Disturbance of Skin Sensation  (ICD-782.0) 25)  Benign Prostatic Hypertrophy  (ICD-600.00) 26)  Low Back Pain  (ICD-724.2) 27)  Hypertension  (ICD-401.9) 28)  Diabetes Mellitus, Type II  (ICD-250.00) 29)  Anxiety  (ICD-300.00) 30)  Hyperlipidemia  (ICD-272.4) 31)  COPD  (ICD-496)  Medications Prior to Update:  1)  Albuterol 90 Mcg/act Aers (Albuterol) .... Inhale 2 Puff Using Inhaler Every Twelve Hours 2)  Ecotrin Low Strength 81 Mg  Tbec (Aspirin) .Marland Kitchen.. 1po Qd 3)  Pravachol 40 Mg  Tabs (Pravastatin Sodium) .Marland Kitchen.. 1 By Mouth Once Daily 4)  Hydrochlorothiazide 25 Mg  Tabs (Hydrochlorothiazide) .... Take 1 Tab By Mouth Every Morning For 5 Days Per Week 5)  Lotensin 40 Mg  Tabs (Benazepril Hcl) .Marland Kitchen.. 1 By Mouth Two Times A Day 6)  Pindolol 5 Mg  Tabs (Pindolol) .... 1/2 By Mouth Two Times A Day 7)  Cardizem La 180 Mg  Tb24 (Diltiazem Hcl Coated Beads) .Marland Kitchen.. 1 By Mouth Two Times A Day 8)  Omeprazole 20 Mg  Cpdr (Omeprazole) .Marland Kitchen.. 1 By Mouth Once Daily As Needed 9)  Hydrocodone-Acetaminophen 5-325 Mg Tabs  (Hydrocodone-Acetaminophen) .Marland Kitchen.. 1 - 2 By Mouth Two Times A Day As Needed 10)  Prilosec Otc 20 Mg Tbec (Omeprazole Magnesium) .Marland Kitchen.. 1 Once Daily 11)  Lactaid .Marland Kitchen.. 1 Three Times A Day As Needed 12)  Advil 200 Mg Caps (Ibuprofen) .... Use As Directed On Medication Bottle 13)  Ecotrin Low Strength 81 Mg Tbec (Aspirin) .... Use As Directed Once Daily  Current Medications (verified): 1)  Proventil Hfa 108 (90 Base) Mcg/act Aers (Albuterol Sulfate) .... 2 Puffs Four Times Per Day As Needed 2)  Ecotrin Low Strength 81 Mg  Tbec (Aspirin) .Marland Kitchen.. 1po Qd 3)  Pravachol 40 Mg  Tabs (Pravastatin Sodium) .Marland Kitchen.. 1 By Mouth Once Daily 4)  Hydrochlorothiazide 25 Mg  Tabs (Hydrochlorothiazide) .... Take 1 Tab By Mouth Every Morning For 5 Days Per Week 5)  Lotensin 40 Mg  Tabs (Benazepril Hcl) .Marland Kitchen.. 1 By Mouth Two Times A Day 6)  Pindolol 5 Mg  Tabs (Pindolol) .... 1/2 By Mouth Two Times A Day 7)  Cardizem La 180 Mg  Tb24 (Diltiazem Hcl Coated Beads) .Marland Kitchen.. 1 By Mouth Two Times A Day 8)  Omeprazole 20 Mg  Cpdr (Omeprazole) .Marland Kitchen.. 1 By Mouth Once Daily As Needed 9)  Hydrocodone-Acetaminophen 5-325 Mg Tabs (Hydrocodone-Acetaminophen) .Marland Kitchen.. 1 - 2 By Mouth Two Times A Day As Needed 10)  Prilosec Otc 20 Mg Tbec (Omeprazole Magnesium) .Marland Kitchen.. 1 Once Daily 11)  Lactaid .Marland Kitchen.. 1 Three Times A Day As Needed 12)  Advil 200 Mg Caps (Ibuprofen) .... Use As Directed On Medication Bottle 13)  Ecotrin Low Strength 81 Mg Tbec (Aspirin) .... Use As Directed Once Daily 14)  Spiriva Handihaler 18 Mcg Caps (Tiotropium Bromide Monohydrate) .Marland Kitchen.. 1 Puff Once Daily  Allergies (verified): No Known Drug Allergies  Past History:  Past Surgical History: Last updated: 02/11/2007 Prostatectomy-09/17/2005  Social History: Last updated: 08/08/2008 Former Smoker Alcohol use-yes work - Sales executive Energy manager - [employer] Divorced 3 grown children  Risk Factors: Smoking Status: quit (06/03/2007)  Past Medical  History: COPD Hyperlipidemia Anxiety Diabetes mellitus, type II - diet Hypertension Low back pain Benign prostatic hypertrophy H/O Lower Extremity Paresthesias Diverticulosis, colon Prostate cancer, hx of lumbar disc dz ETOH - remission Nephrolithiasis, hx of E.D. hx of UTI 7/09 Colonic polyps, hx of - adenoma 11/09  Review of Systems       all otherwise negative per pt -    Physical Exam  General:  alert and overweight-appearing.   Head:  normocephalic and atraumatic.   Eyes:  vision grossly intact, pupils equal, and pupils round.   Ears:  R ear normal and L ear normal.   Nose:  no external deformity  and no nasal discharge.   Mouth:  no gingival abnormalities and pharynx pink and moist.   Neck:  supple and no masses.   Lungs:  normal respiratory effort, R decreased breath sounds, and L decreased breath sounds. - no wheezing or rales Heart:  normal rate and regular rhythm.   Abdomen:  soft, non-tender, and normal bowel sounds.   Extremities:  no edema, no erythema    Impression & Recommendations:  Problem # 1:  COPD (ICD-496)  His updated medication list for this problem includes:    Proventil Hfa 108 (90 Base) Mcg/act Aers (Albuterol sulfate) .Marland Kitchen... 2 puffs four times per day as needed    Spiriva Handihaler 18 Mcg Caps (Tiotropium bromide monohydrate) .Marland Kitchen... 1 puff once daily mild uncontrolled baseline symptoms, without significant infection or exac at this time , despite the low grade temp  treat as above, f/u any worsening signs or symptoms  - add spiriva daily, add proventil hfa as needed ;  consider trial symbicort, consider repeat PFT's (last done over 3 yrs ago, declines for now, as well as cxr, or labs such as cbc to r/o anemia)  Problem # 2:  HYPERTENSION (ICD-401.9)  His updated medication list for this problem includes:    Hydrochlorothiazide 25 Mg Tabs (Hydrochlorothiazide) .Marland Kitchen... Take 1 tab by mouth every morning for 5 days per week    Lotensin 40 Mg Tabs  (Benazepril hcl) .Marland Kitchen... 1 by mouth two times a day    Pindolol 5 Mg Tabs (Pindolol) .Marland Kitchen... 1/2 by mouth two times a day    Cardizem La 180 Mg Tb24 (Diltiazem hcl coated beads) .Marland Kitchen... 1 by mouth two times a day  BP today: 132/64 Prior BP: 140/70 (08/27/2009)  Labs Reviewed: K+: 4.1 (08/27/2009) Creat: : 1.3 (08/27/2009)   Chol: 141 (08/27/2009)   HDL: 52.60 (08/27/2009)   LDL: 81 (08/27/2009)   TG: 39.0 (08/27/2009) stable overall by hx and exam, ok to continue meds/tx as is   Problem # 3:  DIABETES MELLITUS, TYPE II (ICD-250.00)  His updated medication list for this problem includes:    Ecotrin Low Strength 81 Mg Tbec (Aspirin) .Marland Kitchen... 1po qd    Lotensin 40 Mg Tabs (Benazepril hcl) .Marland Kitchen... 1 by mouth two times a day    Ecotrin Low Strength 81 Mg Tbec (Aspirin) ..... Use as directed once daily  Labs Reviewed: Creat: 1.3 (08/27/2009)    Reviewed HgBA1c results: 6.7 (08/27/2009)  6.9 (01/20/2008) stable overall by hx and exam, ok to continue meds/tx as is ,. declines labs today  Problem # 4:  HYPERLIPIDEMIA (ICD-272.4)  His updated medication list for this problem includes:    Pravachol 40 Mg Tabs (Pravastatin sodium) .Marland Kitchen... 1 by mouth once daily  Labs Reviewed: SGOT: 30 (08/27/2009)   SGPT: 24 (08/27/2009)   HDL:52.60 (08/27/2009), 43.8 (08/08/2008)  LDL:81 (08/27/2009), 74 (08/08/2008)  Chol:141 (08/27/2009), 126 (08/08/2008)  Trig:39.0 (08/27/2009), 42 (08/08/2008) stable overall by hx and exam, ok to continue meds/tx as is , declines labs today  Complete Medication List: 1)  Proventil Hfa 108 (90 Base) Mcg/act Aers (Albuterol sulfate) .... 2 puffs four times per day as needed 2)  Ecotrin Low Strength 81 Mg Tbec (Aspirin) .Marland Kitchen.. 1po qd 3)  Pravachol 40 Mg Tabs (Pravastatin sodium) .Marland Kitchen.. 1 by mouth once daily 4)  Hydrochlorothiazide 25 Mg Tabs (Hydrochlorothiazide) .... Take 1 tab by mouth every morning for 5 days per week 5)  Lotensin 40 Mg Tabs (Benazepril hcl) .Marland Kitchen.. 1 by mouth two times  a day 6)  Pindolol 5 Mg Tabs (Pindolol) .... 1/2 by mouth two times a day 7)  Cardizem La 180 Mg Tb24 (Diltiazem hcl coated beads) .Marland Kitchen.. 1 by mouth two times a day 8)  Omeprazole 20 Mg Cpdr (Omeprazole) .Marland Kitchen.. 1 by mouth once daily as needed 9)  Hydrocodone-acetaminophen 5-325 Mg Tabs (Hydrocodone-acetaminophen) .Marland Kitchen.. 1 - 2 by mouth two times a day as needed 10)  Prilosec Otc 20 Mg Tbec (Omeprazole magnesium) .Marland Kitchen.. 1 once daily 11)  Lactaid  .Marland Kitchen.. 1 three times a day as needed 12)  Advil 200 Mg Caps (Ibuprofen) .... Use as directed on medication bottle 13)  Ecotrin Low Strength 81 Mg Tbec (Aspirin) .... Use as directed once daily 14)  Spiriva Handihaler 18 Mcg Caps (Tiotropium bromide monohydrate) .Marland Kitchen.. 1 puff once daily  Patient Instructions: 1)  Please take all new medications as prescribed 2)  Continue all previous medications as before this visit  3)  Please schedule a follow-up appointment in Feb 2012 wtih CPX labs Prescriptions: PROVENTIL HFA 108 (90 BASE) MCG/ACT AERS (ALBUTEROL SULFATE) 2 puffs four times per day as needed  #1 x 11   Entered and Authorized by:   Biagio Borg MD   Signed by:   Biagio Borg MD on 01/31/2010   Method used:   Print then Give to Patient   RxID:   [id number] SPIRIVA HANDIHALER 18 MCG CAPS (TIOTROPIUM BROMIDE MONOHYDRATE) 1 puff once daily  #30 x 11   Entered and Authorized by:   Biagio Borg MD   Signed by:   Biagio Borg MD on 01/31/2010   Method used:   Print then Give to Patient   RxID:   [id number]

## 2010-08-20 NOTE — Letter (Signed)
Summary: [facility]  [facility]   Imported By: Bubba Hales 06/03/2010 10:49:10  _____________________________________________________________________  External Attachment:    Type:   Image     Comment:   External Document

## 2010-08-20 NOTE — Assessment & Plan Note (Signed)
Summary: hosp f/u SD   Vital Signs:  Patient Profile:   65 Years Old Male Weight:      245 pounds Temp:     98.0 degrees F oral Pulse rate:   62 / minute BP sitting:   136 / 70  (right arm) Cuff size:   regular  Vitals Entered By: Julious Payer MA (February 03, 2008 11:36 AM)                 Chief Complaint:  hospital follow-up.  History of Present Illness: overall doing well, spent 3 days in the hosp with urosepsis with IV meds, no further chills, fever or GU symptoms    Updated Prior Medication List: ALBUTEROL 90 MCG/ACT AERS (ALBUTEROL) Inhale 2 puff using inhaler every twelve hours ECOTRIN LOW STRENGTH 81 MG  TBEC (ASPIRIN) 1po qd PRAVACHOL 40 MG  TABS (PRAVASTATIN SODIUM) 1 by mouth qd HYDROCHLOROTHIAZIDE 25 MG  TABS (HYDROCHLOROTHIAZIDE) Take 1 tab by mouth every morning for 5 days per week LOTENSIN 40 MG  TABS (BENAZEPRIL HCL) 1 by mouth bid PINDOLOL 5 MG  TABS (PINDOLOL) 1/2 by mouth bid CARDIZEM LA 180 MG  TB24 (DILTIAZEM HCL COATED BEADS) 1 by mouth bid OMEPRAZOLE 20 MG  CPDR (OMEPRAZOLE) 1 by mouth once daily prn TRAMADOL HCL 50 MG  TABS (TRAMADOL HCL) 2 by mouth qid prn CORTISPORIN 3.5-10000-1  SUSP (NEOMYCIN-POLYMYXIN-HC) use asd 2 gtt qid for 10 days right ear  Current Allergies (reviewed today): No known allergies   Past Medical History:    Reviewed history from 06/03/2007 and no changes required:       COPD       Hyperlipidemia       Anxiety       Diabetes mellitus, type II - diet       Hypertension       Low back pain       Benign prostatic hypertrophy       H/O Lower Extremity Paresthesias       Diverticulosis, colon       H/O Alcholism       Prostate cancer, hx of       lumbar disc dz       ETOH - remission       Nephrolithiasis, hx of  Past Surgical History:    Reviewed history from 02/11/2007 and no changes required:       Prostatectomy-09/17/2005   Social History:    Reviewed history from 06/03/2007 and no changes required:  Former Smoker       Alcohol use-yes    Review of Systems       all otherwise negative    Physical Exam  General:     Well-developed,well-nourished,in no acute distress; alert,appropriate and cooperative throughout examination Head:     Normocephalic and atraumatic without obvious abnormalities. No apparent alopecia or balding. Eyes:     No corneal or conjunctival inflammation noted. EOMI. Perrla. Ears:     External ear exam shows no significant lesions or deformities.  Otoscopic examination reveals clear canals, tympanic membranes are intact bilaterally without bulging, retraction, inflammation or discharge. Hearing is grossly normal bilaterally. Nose:     External nasal examination shows no deformity or inflammation. Nasal mucosa are pink and moist without lesions or exudates. Mouth:     Oral mucosa and oropharynx without lesions or exudates.  Teeth in good repair. Neck:     No deformities, masses, or tenderness noted. Lungs:     Normal  respiratory effort, chest expands symmetrically. Lungs are clear to auscultation, no crackles or wheezes. Heart:     Normal rate and regular rhythm. S1 and S2 normal without gallop, murmur, click, rub or other extra sounds. Extremities:     no edema, no ulcers     Impression & Recommendations:  Problem # 1:  UTI (ICD-599.0)  The following medications were removed from the medication list:    Septra Ds 800-160 Mg Tabs (Sulfamethoxazole-trimethoprim) .Marland Kitchen... 1 by mouth two times a day  Orders: T-Culture, Urine [id number]) TLB-Udip w/ Micro (81001-URINE)  check f/u urine culture as he had no GU symptoms to start to assure clearance; o/w seems to be doing well  Complete Medication List: 1)  Albuterol 90 Mcg/act Aers (Albuterol) .... Inhale 2 puff using inhaler every twelve hours 2)  Ecotrin Low Strength 81 Mg Tbec (Aspirin) .Marland Kitchen.. 1po qd 3)  Pravachol 40 Mg Tabs (Pravastatin sodium) .Marland Kitchen.. 1 by mouth qd 4)  Hydrochlorothiazide 25 Mg Tabs  (Hydrochlorothiazide) .... Take 1 tab by mouth every morning for 5 days per week 5)  Lotensin 40 Mg Tabs (Benazepril hcl) .Marland Kitchen.. 1 by mouth bid 6)  Pindolol 5 Mg Tabs (Pindolol) .... 1/2 by mouth bid 7)  Cardizem La 180 Mg Tb24 (Diltiazem hcl coated beads) .Marland Kitchen.. 1 by mouth bid 8)  Omeprazole 20 Mg Cpdr (Omeprazole) .Marland Kitchen.. 1 by mouth once daily prn 9)  Tramadol Hcl 50 Mg Tabs (Tramadol hcl) .... 2 by mouth qid prn 10)  Cortisporin 3.5-10000-1 Susp (Neomycin-polymyxin-hc) .... Use asd 2 gtt qid for 10 days right ear   Patient Instructions: 1)  you will have the urine tested today 2)  Continue all medications that you may have been taking previously 3)  Please schedule a follow-up appointment in 6 months wtih prior: 4)  BMP prior to visit, ICD-9: 250.02 5)  Lipid Panel prior to visit, ICD-9: 6)  HbgA1C prior to visit, ICD-9:   ]

## 2010-08-20 NOTE — Progress Notes (Signed)
Summary: Rx change  Phone Note Call from Patient Call back at Home Phone [phone]   Caller: Patient Summary of Call: Pt called requesting a 90 day supply of spiviva which would save him money. Initial call taken by: Crissie Sickles, CMA,  March 07, 2010 12:07 PM    Prescriptions: SPIRIVA HANDIHALER 18 MCG CAPS (TIOTROPIUM BROMIDE MONOHYDRATE) 1 puff once daily  #90 x 3   Entered by:   Crissie Sickles, CMA   Authorized by:   Biagio Borg MD   Signed by:   Crissie Sickles, CMA on 03/07/2010   Method used:   Electronically to        [facility] [phone]* (retail)       [place], [place]  [id number]       Ph: [phone] or [phone]       Fax: [phone]   RxID:   [id number]

## 2010-08-20 NOTE — Progress Notes (Signed)
Summary: Rx req  Phone Note Refill Request Message from:  Patient  Refills Requested: Medication #1:  ALBUTEROL 90 MCG/ACT AERS Inhale 2 puff using inhaler every twelve hours   Dosage confirmed as above?Dosage Confirmed   Supply Requested: 6 months Rx for OTC medications mail to pt per pt request   Method Requested: Electronic Initial call taken by: Crissie Sickles, CMA,  January 24, 2010 11:12 AM    New/Updated Medications: ADVIL 200 MG CAPS (IBUPROFEN) use as directed on medication bottle ECOTRIN LOW STRENGTH 81 MG TBEC (ASPIRIN) use as directed once daily Prescriptions: ECOTRIN LOW STRENGTH 81 MG TBEC (ASPIRIN) use as directed once daily  #30 x 11   Entered by:   Crissie Sickles, CMA   Authorized by:   Biagio Borg MD   Signed by:   Crissie Sickles, CMA on 01/24/2010   Method used:   Print then Mail to Patient   RxID:   [id number] ADVIL 200 MG CAPS (IBUPROFEN) use as directed on medication bottle  #30 x 11   Entered by:   Crissie Sickles, CMA   Authorized by:   Biagio Borg MD   Signed by:   Crissie Sickles, CMA on 01/24/2010   Method used:   Print then Mail to Patient   [id number] ALBUTEROL 90 MCG/ACT AERS (ALBUTEROL) Inhale 2 puff using inhaler every twelve hours  #1 x 6   Entered by:   Crissie Sickles, CMA   Authorized by:   Biagio Borg MD   Signed by:   Crissie Sickles, CMA on 01/24/2010   Method used:   Electronically to        [facility] [phone]* (retail)       [place], [place]  [id number]       Ph: [phone] or [phone]       Fax: [phone]   RxIDCG:[id number]

## 2010-08-20 NOTE — Progress Notes (Signed)
Summary: ? about his lab  Phone Note Call from Patient Call back at Home Phone [phone]   Caller: [phone] Call For: dr Jenny Reichmann Summary of Call: per pt call stated  went in to the hospital on friday  got out to day want to know how he contract  bacteria in his blood stream  and unrine stated no one told him how this happened  heard the msg from 7-2 , just have some concerns  Initial call taken by: Nonah Mattes,  January 24, 2008 3:46 PM  Follow-up for Phone Call        the bacteria get into the blood stream from the bladder when infections occur; sometimes infections occur in the bladder for men  who have had prostate surgury; also is he currently on an antibiotic?  - I cant tell b/c the discharge summary is not yet on the computer, and he needs to be on an antibiotic that works Follow-up by: Biagio Borg MD,  January 24, 2008 5:04 PM  Additional Follow-up for Phone Call Additional follow up Details #1::        Provider Notified January 25, 2008 8:12 AM CALLED PT TO INFORM TO CALL THE OFFICE  January 25, 2008 8:25 AM PT CALLED BACK INFORM PT PT WAS NOTIFIED OF DR Jenny Reichmann RESPONSE Additional Follow-up by: Nonah Mattes,  January 25, 2008 8:13 AM

## 2010-08-20 NOTE — Letter (Signed)
Summary: [facility] By: Rise Patience 01/22/2010 10:51:54  _____________________________________________________________________  External Attachment:    Type:   Image     Comment:   External Document

## 2010-08-20 NOTE — Progress Notes (Signed)
Summary: APT TODAY  Phone Note Call from Patient Call back at [facility] Phone [phone]   Summary of Call: Pt called c/o chills. No fever (says his head is not hot, does not have a way to check). No sob, pain, congestion, body aches, cough or any other complaints. Will see Dr Jenny Reichmann at 4:00 Initial call taken by: Charlsie Quest,  January 19, 2008 11:09 AM  Follow-up for Phone Call        noted Follow-up by: Biagio Borg MD,  January 19, 2008 11:30 AM

## 2010-08-20 NOTE — Progress Notes (Signed)
  Phone Note Refill Request Message from:  Fax from Pharmacy on October 27, 2008 12:54 PM  Refills Requested: Medication #1:  ALBUTEROL 90 MCG/ACT AERS Inhale 2 puff using inhaler every twelve hours Initial call taken by: Hector Brunswick,  October 27, 2008 12:54 PM      Prescriptions: ALBUTEROL 90 MCG/ACT AERS (ALBUTEROL) Inhale 2 puff using inhaler every twelve hours  #54 gram x 3   Entered by:   Hector Brunswick   Authorized by:   Biagio Borg MD   Signed by:   Hector Brunswick on 10/27/2008   Method used:   Electronically to        [facility] [phone]* (retail)       [place], [place]  [id number]       Ph: [phone] or [phone]       Fax: [phone]   RxID:   [id number]

## 2010-08-20 NOTE — Assessment & Plan Note (Signed)
Summary: chills SD   Vital Signs:  Patient Profile:   65 Years Old Male Weight:      248 pounds Temp:     98.4 degrees F oral Pulse rate:   73 / minute BP sitting:   121 / 57  (right arm) Cuff size:   regular  Vitals Entered By: Julious Payer MA (January 20, 2008 11:26 AM)                 Chief Complaint:  chills.  History of Present Illness: yesterday am got off work (works third shift), usually goes home to fall asleep in the chair and then later mover to the bed; yest however started with some chills and chanking, no myalgias or arthralgias beyond the usual associated it seems, mouth seemed kind of dry - tongue seems somewhat of color; hand shaking; called here and appt made; temp 101.9, later 101.1, later 100.0 a few hours later, and later 98.6; last night at work with similar episode, shorter  in duration but similar overall; no ENT symptoms, no cough, worsening sob; he is s/p prostate surgury - urination does seem "shorter in duration" but no other symtpoms at this time; feet with some numbness but no current active ulcers or erythema    Updated Prior Medication List: ALBUTEROL 90 MCG/ACT AERS (ALBUTEROL) Inhale 2 puff using inhaler every twelve hours ECOTRIN LOW STRENGTH 81 MG  TBEC (ASPIRIN) 1po qd PRAVACHOL 40 MG  TABS (PRAVASTATIN SODIUM) 1 by mouth qd HYDROCHLOROTHIAZIDE 25 MG  TABS (HYDROCHLOROTHIAZIDE) Take 1 tab by mouth every morning for 5 days per week LOTENSIN 40 MG  TABS (BENAZEPRIL HCL) 1 by mouth bid PINDOLOL 5 MG  TABS (PINDOLOL) 1/2 by mouth bid CARDIZEM LA 180 MG  TB24 (DILTIAZEM HCL COATED BEADS) 1 by mouth bid OMEPRAZOLE 20 MG  CPDR (OMEPRAZOLE) 1 by mouth once daily prn TRAMADOL HCL 50 MG  TABS (TRAMADOL HCL) 2 by mouth qid prn CORTISPORIN 3.5-10000-1  SUSP (NEOMYCIN-POLYMYXIN-HC) use asd 2 gtt qid for 10 days right ear  Current Allergies (reviewed today): No known allergies   Past Medical History:    Reviewed history from 06/03/2007 and no changes  required:       COPD       Hyperlipidemia       Anxiety       Diabetes mellitus, type II       Hypertension       Low back pain       Benign prostatic hypertrophy       H/O Lower Extremity Paresthesias       Diverticulosis, colon       H/O Alcholism       Prostate cancer, hx of       lumbar disc dz       ETOH - remission       Nephrolithiasis, hx of  Past Surgical History:    Reviewed history from 02/11/2007 and no changes required:       Prostatectomy-09/17/2005   Family History:    Reviewed history from 06/03/2007 and no changes required:       Family History of CAD Male 1st degree relative - mother       Family History of CAD Male 1st degree relative  - father       father with prostate cancer       Family History Hypertension  Social History:    Reviewed history from 06/03/2007 and no changes required:  Former Smoker       Alcohol use-yes    Review of Systems       all otherwise negative    Physical Exam  General:     mild ill, well developed  Head:     Normocephalic and atraumatic without obvious abnormalities. No apparent alopecia or balding. Eyes:     No corneal or conjunctival inflammation noted. EOMI. Perrla. Ears:     External ear exam shows no significant lesions or deformities.  Otoscopic examination reveals clear canals, tympanic membranes are intact bilaterally without bulging, retraction, inflammation or discharge. Hearing is grossly normal bilaterally. Nose:     External nasal examination shows no deformity or inflammation. Nasal mucosa are pink and moist without lesions or exudates. Mouth:     Oral mucosa and oropharynx without lesions or exudates.  Teeth in good repair. Neck:     No deformities, masses, or tenderness noted. Lungs:     Normal respiratory effort, chest expands symmetrically. Lungs are clear to auscultation, no crackles or wheezes. Heart:     Normal rate and regular rhythm. S1 and S2 normal without gallop, murmur, click,  rub or other extra sounds. Abdomen:     Bowel sounds positive,abdomen soft and non-tender without masses, organomegaly or hernias noted. Extremities:     no edema, no ulcers  Skin:     no erythema    Impression & Recommendations:  Problem # 1:  FEVER UNSPECIFIED (ICD-780.60) ? viral - no etiology by hx and exam - check cxr, UA , cbc, blood cx x 2 Orders: T-2 View CXR, Same Day ([id number]) T-Culture, Urine [id number]) T- * Misc. Laboratory test [phone] [id number]) TLB-Udip ONLY (81003-UDIP) TLB-CBC Platelet - w/Differential (85025-CBCD) TLB-Udip w/ Micro (81001-URINE)   Problem # 2:  DIABETES MELLITUS, TYPE II (ICD-250.00)  His updated medication list for this problem includes:    Ecotrin Low Strength 81 Mg Tbec (Aspirin) .Marland Kitchen... 1po qd    Lotensin 40 Mg Tabs (Benazepril hcl) .Marland Kitchen... 1 by mouth bid check a1c Orders: TLB-BMP (Basic Metabolic Panel-BMET) (99991111) TLB-A1C / Hgb A1C (Glycohemoglobin) (83036-A1C)   Complete Medication List: 1)  Albuterol 90 Mcg/act Aers (Albuterol) .... Inhale 2 puff using inhaler every twelve hours 2)  Ecotrin Low Strength 81 Mg Tbec (Aspirin) .Marland Kitchen.. 1po qd 3)  Pravachol 40 Mg Tabs (Pravastatin sodium) .Marland Kitchen.. 1 by mouth qd 4)  Hydrochlorothiazide 25 Mg Tabs (Hydrochlorothiazide) .... Take 1 tab by mouth every morning for 5 days per week 5)  Lotensin 40 Mg Tabs (Benazepril hcl) .Marland Kitchen.. 1 by mouth bid 6)  Pindolol 5 Mg Tabs (Pindolol) .... 1/2 by mouth bid 7)  Cardizem La 180 Mg Tb24 (Diltiazem hcl coated beads) .Marland Kitchen.. 1 by mouth bid 8)  Omeprazole 20 Mg Cpdr (Omeprazole) .Marland Kitchen.. 1 by mouth once daily prn 9)  Tramadol Hcl 50 Mg Tabs (Tramadol hcl) .... 2 by mouth qid prn 10)  Cortisporin 3.5-10000-1 Susp (Neomycin-polymyxin-hc) .... Use asd 2 gtt qid for 10 days right ear 11)  Ciprofloxacin Hcl 500 Mg Tabs (Ciprofloxacin hcl) .Marland Kitchen.. 1 by mouth two times a day   Patient Instructions: 1)  you will have blood work today 2)  you will have chest xray today 3)   continue to monitor your fever levels and any other symptoms that may develop 4)  Please schedule a follow-up appointment as needed.   ]

## 2010-08-20 NOTE — Letter (Signed)
[facility]: [facility]   Imported By: Phillis Knack 11/28/2009 08:49:06  _____________________________________________________________________  External Attachment:    Type:   Image     Comment:   External Document

## 2010-08-20 NOTE — Letter (Signed)
Summary: [facility]  [facility]   Imported By: Bubba Hales 10/13/2008 08:22:30  _____________________________________________________________________  External Attachment:    Type:   Image     Comment:   External Document

## 2010-08-20 NOTE — Progress Notes (Signed)
Summary: OTC RX's  Phone Note Call from Patient Call back at Home Phone [phone]   Summary of Call: Pt needs rx's for otc meds so flex spending will pay. Pt needs rx for 3 mth supply of lactaid, prilosec, ibuprofen and tylenol. OK? Please mail to patient when complete.  Initial call taken by: Charlsie Quest, [place],  Nov 30, 2009 5:31 PM  Follow-up for Phone Call        to robin to handle - ok for "reasonable amounts" Follow-up by: Biagio Borg MD,  Nov 30, 2009 5:37 PM    New/Updated Medications: PRILOSEC OTC 20 MG TBEC (OMEPRAZOLE MAGNESIUM) 1 once daily * LACTAID 1 three times a day as needed Prescriptions: OMEPRAZOLE 20 MG  CPDR (OMEPRAZOLE) 1 by mouth once daily as needed  #30 x 3   Entered by:   Sharon Seller   Authorized by:   Biagio Borg MD   Signed by:   Sharon Seller on 12/03/2009   Method used:   Print then Give to Patient   [id number] LACTAID 1 three times a day as needed  #90 x 3   Entered by:   Sharon Seller   Authorized by:   Biagio Borg MD   Signed by:   Sharon Seller on 12/03/2009   Method used:   Print then Give to Patient   RxID:   [id number] OMEPRAZOLE 20 MG  CPDR (OMEPRAZOLE) 1 by mouth once daily as needed  #30 x 3   Entered by:   Sharon Seller   Authorized by:   Biagio Borg MD   Signed by:   Sharon Seller on 12/03/2009   Method used:   Electronically to        [facility] [phone]* (retail)       [place], [place]  [id number]       Ph: [phone] or [phone]       Fax: [phone]   RxID:   [id number]

## 2010-08-20 NOTE — Progress Notes (Signed)
Summary: tramadol  Phone Note Refill Request Message from:  Fax from Pharmacy on October 25, 2008 11:16 AM  Refills Requested: Medication #1:  TRAMADOL HCL 50 MG  TABS 2 by mouth qid prn.   Dosage confirmed as above?Dosage Confirmed last appt 07/2008  [facility] [address]  Initial call taken by: Hector [place],  October 25, 2008 11:16 AM  Follow-up for Phone Call        done hardcopy to [place] side B - debra  Follow-up by: Biagio Borg MD,  October 25, 2008 1:18 PM  Additional Follow-up for Phone Call Additional follow up Details #1::        called pt to infrom rx at front office for pick up left pt a vm  Additional Follow-up by: Nonah Mattes,  October 25, 2008 4:15 PM    New/Updated Medications: TRAMADOL HCL 50 MG  TABS (TRAMADOL HCL) 2 by mouth qid as needed   Prescriptions: TRAMADOL HCL 50 MG  TABS (TRAMADOL HCL) 2 by mouth qid as needed  #240 x 2   Entered and Authorized by:   Biagio Borg MD   Signed by:   Biagio Borg MD on 10/25/2008   Method used:   Print then Give to Patient   RxID:   [id number]

## 2010-08-20 NOTE — Letter (Signed)
Summary: [facility]   Imported By: Edmonia James 11/25/2008 10:16:23  _____________________________________________________________________  External Attachment:    Type:   Image     Comment:   External Document

## 2010-08-20 NOTE — Letter (Signed)
Summary: Out of Work  [facility]   [place], [place] [address]   Phone: [phone]  Fax: [phone]    August 27, 2009   Employee:  PLEZ HUNDLEY    To Whom It May Concern:   For Medical reasons, please excuse the above named employee from work for the following dates:  Start:   08/27/2009  End:   08/27/2009  If you need additional information, please feel free to contact our office.         Sincerely,    Dr Cathlean Cower

## 2010-08-20 NOTE — Letter (Signed)
Summary: [facility]  [facility]   Imported By: Bubba Hales 05/23/2009 12:51:41  _____________________________________________________________________  External Attachment:    Type:   Image     Comment:   External Document

## 2010-08-20 NOTE — Procedures (Signed)
Summary: [facility]   Imported By: Renne Musca 09/27/2007 14:18:00  _____________________________________________________________________  External Attachment:    Type:   Image     Comment:   External Document

## 2010-08-20 NOTE — Progress Notes (Signed)
Summary: Rx refill req  Phone Note Refill Request Message from:  Fax from Pharmacy on February 25, 2010 12:13 PM  Refills Requested: Medication #1:  HYDROCODONE-ACETAMINOPHEN 5-325 MG TABS 1 - 2 by mouth two times a day as needed   Dosage confirmed as above?Dosage Confirmed   Last Refilled: 09/16/2009   Notes: [place], [phone] Initial call taken by: Shirlean Mylar Ewing CMA Deborra Medina),  February 25, 2010 12:13 PM    New/Updated Medications: HYDROCODONE-ACETAMINOPHEN 5-325 MG TABS (HYDROCODONE-ACETAMINOPHEN) 1 - 2 by mouth two times a day as needed Prescriptions: HYDROCODONE-ACETAMINOPHEN 5-325 MG TABS (HYDROCODONE-ACETAMINOPHEN) 1 - 2 by mouth two times a day as needed  #60 x 1   Entered and Authorized by:   Biagio Borg MD   Signed by:   Biagio Borg MD on 02/25/2010   Method used:   Print then Give to Patient   RxID:   [id number]  done hardcopy to [facility] side B - dahlia Biagio Borg MD  February 25, 2010 1:07 PM   Rx faxed to pharmacy Crissie Sickles, [place]  February 25, 2010 1:24 PM

## 2010-08-20 NOTE — Letter (Signed)
Summary: Out of Work  [facility] [place], [place] [address]   Phone: [phone]  Fax: [phone]    August 08, 2008   Employee:  Ulice Bredeson    To Whom It May Concern:   For Medical reasons, please excuse the above named employee from work for the following dates:  Start:   08/08/08    End:   08/08/08  If you need additional information, please feel free to contact our office.         Sincerely,    Cathlean Cower MD

## 2010-08-20 NOTE — Letter (Signed)
Summary: [facility] By: Rise Patience 05/23/2010 14:53:44  _____________________________________________________________________  External Attachment:    Type:   Image     Comment:   External Document

## 2010-08-20 NOTE — Progress Notes (Signed)
Summary: Medication refill  Phone Note Refill Request Message from:  Fax from Pharmacy on May 02, 2010 2:11 PM  Refills Requested: Medication #1:  HYDROCODONE-ACETAMINOPHEN 5-325 MG TABS 1 - 2 by mouth two times a day as needed   Dosage confirmed as above?Dosage Confirmed   Last Refilled: 02/25/2010   Notes: [facility] [address] [phone] Initial call taken by: Shirlean Mylar Ewing CMA Deborra Medina),  May 02, 2010 2:12 PM  Follow-up for Phone Call        done hardcopy to [facility] side B - dahlia  Follow-up by: Biagio Borg MD,  May 02, 2010 2:35 PM  Additional Follow-up for Phone Call Additional follow up Details #1::        Rx faxed to pharmacy Additional Follow-up by: Crissie Sickles, [place],  May 02, 2010 2:52 PM    Prescriptions: HYDROCODONE-ACETAMINOPHEN 5-325 MG TABS (HYDROCODONE-ACETAMINOPHEN) 1 - 2 by mouth two times a day as needed  #60 x 2   Entered and Authorized by:   Biagio Borg MD   Signed by:   Biagio Borg MD on 05/02/2010   Method used:   Print then Give to Patient   RxID:   [id number]

## 2010-08-20 NOTE — Consult Note (Signed)
Summary: [facility]   Imported By: Phillis Knack 09/18/2009 07:46:27  _____________________________________________________________________  External Attachment:    Type:   Image     Comment:   External Document

## 2010-08-20 NOTE — Assessment & Plan Note (Signed)
Summary: 6 MO ROV /NWS $50   Vital Signs:  Patient Profile:   65 Years Old Male Weight:      253 pounds O2 Sat:      97 % O2 treatment:    Room Air Temp:     98.9 degrees F oral Pulse rate:   63 / minute BP sitting:   130 / 60  (left arm) Cuff size:   large  Vitals Entered By: Sherlean Foot CMA (August 08, 2008 9:13 AM)                 Preventive Care Screening  Last Pneumovax:    Date:  01/19/2008    Next Due:  01/2013    Results:  given   Colonoscopy:    Date:  05/31/2007    Next Due:  05/2012    Results:  Adenomatous Polyp   Last Flu Shot:    Date:  04/20/2008    Results:  given    Chief Complaint:  6 mo rov.  History of Present Illness: overall doing well, sees dr Rollene Fare on regular basis, BP at home seems controlled; denies polys and low sugars, denies C, sob, doe, orthopena, pnd or LE edema    Prior Medications Reviewed Using: Patient Recall  Updated Prior Medication List: ALBUTEROL 90 MCG/ACT AERS (ALBUTEROL) Inhale 2 puff using inhaler every twelve hours ECOTRIN LOW STRENGTH 81 MG  TBEC (ASPIRIN) 1po qd PRAVACHOL 40 MG  TABS (PRAVASTATIN SODIUM) 1 by mouth qd HYDROCHLOROTHIAZIDE 25 MG  TABS (HYDROCHLOROTHIAZIDE) Take 1 tab by mouth every morning for 5 days per week LOTENSIN 40 MG  TABS (BENAZEPRIL HCL) 1 by mouth bid PINDOLOL 5 MG  TABS (PINDOLOL) 1/2 by mouth bid CARDIZEM LA 180 MG  TB24 (DILTIAZEM HCL COATED BEADS) 1 by mouth bid OMEPRAZOLE 20 MG  CPDR (OMEPRAZOLE) 1 by mouth once daily prn TRAMADOL HCL 50 MG  TABS (TRAMADOL HCL) 2 by mouth qid prn  Current Allergies (reviewed today): No known allergies   Past Medical History:    Reviewed history from 02/03/2008 and no changes required:       COPD       Hyperlipidemia       Anxiety       Diabetes mellitus, type II - diet       Hypertension       Low back pain       Benign prostatic hypertrophy       H/O Lower Extremity Paresthesias       Diverticulosis, colon       H/O Alcholism       Prostate cancer, hx of       lumbar disc dz       ETOH - remission       Nephrolithiasis, hx of       E.D.       hx of UTI 7/09       Colonic polyps, hx of - adenoma 11/09         Past Surgical History:    Reviewed history from 02/11/2007 and no changes required:       Prostatectomy-09/17/2005   Family History:    Reviewed history from 06/03/2007 and no changes required:       Family History of CAD Male 1st degree relative - mother       Family History of CAD Male 1st degree relative  - father       father with prostate cancer  Family History Hypertension  Social History:    Reviewed history from 06/03/2007 and no changes required:       Former Smoker       Alcohol use-yes       work - Sales executive Energy manager - [employer]       Divorced       3 grown children   Risk Factors:  Colonoscopy History:     Date of Last Colonoscopy:  05/31/2007    Results:  Adenomatous Polyp    Review of Systems  The patient denies anorexia, fever, weight loss, weight gain, vision loss, decreased hearing, hoarseness, chest pain, syncope, dyspnea on exertion, peripheral edema, prolonged cough, headaches, hemoptysis, abdominal pain, melena, hematochezia, severe indigestion/heartburn, hematuria, incontinence, muscle weakness, suspicious skin lesions, transient blindness, difficulty walking, depression, unusual weight change, abnormal bleeding, enlarged lymph nodes, angioedema, and breast masses.         all otherwise negative    Physical Exam  General:     alert and overweight-appearing.   Head:     Normocephalic and atraumatic without obvious abnormalities. No apparent alopecia or balding. Eyes:     No corneal or conjunctival inflammation noted. EOMI. Perrla. Ears:     External ear exam shows no significant lesions or deformities.  Otoscopic examination reveals clear canals, tympanic membranes are intact bilaterally without bulging, retraction, inflammation or  discharge. Hearing is grossly normal bilaterally. Nose:     External nasal examination shows no deformity or inflammation. Nasal mucosa are pink and moist without lesions or exudates. Mouth:     Oral mucosa and oropharynx without lesions or exudates.  Teeth in good repair. Lungs:     Normal respiratory effort, chest expands symmetrically. Lungs are clear to auscultation, no crackles or wheezes. Heart:     regular rhythm and no murmur.   Abdomen:     Bowel sounds positive,abdomen soft and non-tender without masses, organomegaly or hernias noted. Msk:     no joint tenderness and no joint swelling.   Extremities:     no edema, no ulcers  Neurologic:     cranial nerves II-XII intact and strength normal in all extremities.      Impression & Recommendations:  Problem # 1:  Preventive Health Care (ICD-V70.0) Overall doing well, up to date, counseled on routine health concerns for screening and prevention, immunizations up to date or declined, labs ordered   Orders: TLB-BMP (Basic Metabolic Panel-BMET) (99991111) TLB-CBC Platelet - w/Differential (85025-CBCD) TLB-Hepatic/Liver Function Pnl (80076-HEPATIC) TLB-Lipid Panel (80061-LIPID) TLB-PSA (Prostate Specific Antigen) (84153-PSA) TLB-TSH (Thyroid Stimulating Hormone) (84443-TSH) TLB-Udip ONLY (81003-UDIP)   Problem # 2:  DIABETES MELLITUS, TYPE II (ICD-250.00)  His updated medication list for this problem includes:    Ecotrin Low Strength 81 Mg Tbec (Aspirin) .Marland Kitchen... 1po qd    Lotensin 40 Mg Tabs (Benazepril hcl) .Marland Kitchen... 1 by mouth bid  Labs Reviewed: HgBA1c: 6.9 (01/20/2008)   Creat: 1.9 (01/20/2008)    stable overall by hx and exam, ok to continue meds/tx as is - to check labs today  Problem # 3:  HYPERTENSION (ICD-401.9)  His updated medication list for this problem includes:    Hydrochlorothiazide 25 Mg Tabs (Hydrochlorothiazide) .Marland Kitchen... Take 1 tab by mouth every morning for 5 days per week    Lotensin 40 Mg Tabs  (Benazepril hcl) .Marland Kitchen... 1 by mouth bid    Pindolol 5 Mg Tabs (Pindolol) .Marland Kitchen... 1/2 by mouth bid    Cardizem La 180 Mg Tb24 (Diltiazem hcl coated  beads) .Marland Kitchen... 1 by mouth bid  BP today: 130/60 Prior BP: 136/70 (02/03/2008) stable overall by hx and exam, ok to continue meds/tx as is  Labs Reviewed: Creat: 1.9 (01/20/2008) Chol: 148 (01/18/2007)   HDL: 41.4 (01/18/2007)   LDL: 95 (01/18/2007)   TG: 57 (01/18/2007)   Complete Medication List: 1)  Albuterol 90 Mcg/act Aers (Albuterol) .... Inhale 2 puff using inhaler every twelve hours 2)  Ecotrin Low Strength 81 Mg Tbec (Aspirin) .Marland Kitchen.. 1po qd 3)  Pravachol 40 Mg Tabs (Pravastatin sodium) .Marland Kitchen.. 1 by mouth qd 4)  Hydrochlorothiazide 25 Mg Tabs (Hydrochlorothiazide) .... Take 1 tab by mouth every morning for 5 days per week 5)  Lotensin 40 Mg Tabs (Benazepril hcl) .Marland Kitchen.. 1 by mouth bid 6)  Pindolol 5 Mg Tabs (Pindolol) .... 1/2 by mouth bid 7)  Cardizem La 180 Mg Tb24 (Diltiazem hcl coated beads) .Marland Kitchen.. 1 by mouth bid 8)  Omeprazole 20 Mg Cpdr (Omeprazole) .Marland Kitchen.. 1 by mouth once daily prn 9)  Tramadol Hcl 50 Mg Tabs (Tramadol hcl) .... 2 by mouth qid prn  Other Orders: Tdap => 60yrs IM [id number]) Admin 1st Vaccine [id number]) H1N1 vaccine G code [id number]) Influenza A (H1N1) adm  fee Medicare/Non Medicare [phone])   Patient Instructions: 1)  you received the tetanus and h1n1 shots today 2)  your lab results will be faxed to dr borden/urology 3)  Continue all medications that you may have been taking previously  4)  Please go to the Lab in the basement for your blood tests today 5)  Please schedule a follow-up appointment in 6 months with  prior: 6)  BMP prior to visit, ICD-9: 250.02 7)  Lipid Panel prior to visit, ICD-9: 8)  HbgA1C prior to visit, ICD-9:     Tetanus/Td Vaccine    Vaccine Type: Tdap    Site: left deltoid    Mfr: [email or web address]    Dose: 0.5 ml    Route: IM    Given by: Sherlean Foot CMA    Exp. Date: 06/24/2010    Lot #:  [id number]    VIS given: 06/08/07 version given August 08, 2008.  H1N1 # 1    Vaccine Type: H1N1 vaccine G code    Site: right deltoid    Mfr: [email or web address]    Dose: 0.5 ml    Route: IM    Given by: Sherlean Foot CMA    Exp. Date: 4/10    Lot #: [id number]    VIS given: 04/20/2009 given August 08, 2008.

## 2010-08-20 NOTE — Letter (Signed)
Summary: [facility]   Imported By: Bubba Hales 01/18/2010 09:53:12  _____________________________________________________________________  External Attachment:    Type:   Image     Comment:   External Document

## 2010-08-20 NOTE — Assessment & Plan Note (Signed)
Summary: EAR INFECTION/CAN'T HEAR OUT OF THAT EAR/NWS  Medications Added ALBUTEROL 90 MCG/ACT AERS (ALBUTEROL) Inhale 2 puff using inhaler every twelve hours ECOTRIN LOW STRENGTH 81 MG  TBEC (ASPIRIN) 1po qd PRAVACHOL 40 MG  TABS (PRAVASTATIN SODIUM) 1 by mouth qd HYDROCHLOROTHIAZIDE 25 MG  TABS (HYDROCHLOROTHIAZIDE) Take 1 tab by mouth every morning for 5 days per week LOTENSIN 40 MG  TABS (BENAZEPRIL HCL) 1 by mouth bid PINDOLOL 5 MG  TABS (PINDOLOL) 1/2 by mouth bid CARDIZEM LA 180 MG  TB24 (DILTIAZEM HCL COATED BEADS) 1 by mouth bid OMEPRAZOLE 20 MG  CPDR (OMEPRAZOLE) 1 by mouth once daily prn TRAMADOL HCL 50 MG  TABS (TRAMADOL HCL) 2 by mouth qid prn CORTISPORIN 3.5-10000-1  SUSP (NEOMYCIN-POLYMYXIN-HC) use asd 2 gtt qid for 10 days right ear      Allergies Added: NKDA  Vital Signs:  Patient Profile:   65 Years Old Male Weight:      247.5 pounds Temp:     98.7 degrees F Pulse rate:   62 / minute BP sitting:   131 / 74                 History of Present Illness: overall doing ok except for significant hearing loss, severe to right ear - had pain also and saw urgent care several days ago for which he received eardrops but is out now and still cannot hear; pain is better however  Current Allergies (reviewed today): No known allergies  Updated/Current Medications (including changes made in today's visit):  ALBUTEROL 90 MCG/ACT AERS (ALBUTEROL) Inhale 2 puff using inhaler every twelve hours ECOTRIN LOW STRENGTH 81 MG  TBEC (ASPIRIN) 1po qd PRAVACHOL 40 MG  TABS (PRAVASTATIN SODIUM) 1 by mouth qd HYDROCHLOROTHIAZIDE 25 MG  TABS (HYDROCHLOROTHIAZIDE) Take 1 tab by mouth every morning for 5 days per week LOTENSIN 40 MG  TABS (BENAZEPRIL HCL) 1 by mouth bid PINDOLOL 5 MG  TABS (PINDOLOL) 1/2 by mouth bid CARDIZEM LA 180 MG  TB24 (DILTIAZEM HCL COATED BEADS) 1 by mouth bid OMEPRAZOLE 20 MG  CPDR (OMEPRAZOLE) 1 by mouth once daily prn TRAMADOL HCL 50 MG  TABS (TRAMADOL HCL) 2  by mouth qid prn CORTISPORIN 3.5-10000-1  SUSP (NEOMYCIN-POLYMYXIN-HC) use asd 2 gtt qid for 10 days right ear   Past Surgical History:    Reviewed history from 02/11/2007 and no changes required:       Prostatectomy-09/17/2005   Family History:    Reviewed history and no changes required:       Family History of CAD Male 1st degree relative - mother       Family History of CAD Male 1st degree relative  - father       father with prostate cancer       Family History Hypertension  Social History:    Reviewed history and no changes required:       Former Smoker       Alcohol use-yes   Risk Factors:  Tobacco use:  quit Alcohol use:  yes    Physical Exam  General:     Well-developed,well-nourished,in no acute distress; alert,appropriate and cooperative throughout examination Head:     Normocephalic and atraumatic without obvious abnormalities. No apparent alopecia or balding. Eyes:     No corneal or conjunctival inflammation noted. EOMI. Perrla. Ears:     right ext canal with wax impaction removed with irrigation and hearing largely restored; still with significant ext otitis it seems Nose:  External nasal examination shows no deformity or inflammation. Nasal mucosa are pink and moist without lesions or exudates. Mouth:     Oral mucosa and oropharynx without lesions or exudates.  Teeth in good repair. Neck:     No deformities, masses, or tenderness noted. Lungs:     Normal respiratory effort, chest expands symmetrically. Lungs are clear to auscultation, no crackles or wheezes. Heart:     Normal rate and regular rhythm. S1 and S2 normal without gallop, murmur, click, rub or other extra sounds. Extremities:     no edema    Impression & Recommendations:  Problem # 1:  OTITIS EXTERNA, RIGHT (ICD-380.10)  His updated medication list for this problem includes:    Cortisporin 3.5-10000-1 Susp (Neomycin-polymyxin-hc) ..... Use asd 2 gtt qid for 10 days right  ear  tx as above   Problem # 2:  IMPACTED CERUMEN (ICD-380.4) resolved with irrigation Orders: EMR Misc Charge Code [facility])   Problem # 3:  HYPERTENSION (ICD-401.9)  His updated medication list for this problem includes:    Hydrochlorothiazide 25 Mg Tabs (Hydrochlorothiazide) .Marland Kitchen... Take 1 tab by mouth every morning for 5 days per week    Lotensin 40 Mg Tabs (Benazepril hcl) .Marland Kitchen... 1 by mouth bid    Pindolol 5 Mg Tabs (Pindolol) .Marland Kitchen... 1/2 by mouth bid    Cardizem La 180 Mg Tb24 (Diltiazem hcl coated beads) .Marland Kitchen... 1 by mouth two times a day  o/w stable - cont meds as is  His updated medication list for this problem includes:    Hydrochlorothiazide 25 Mg Tabs (Hydrochlorothiazide) .Marland Kitchen... Take 1 tab by mouth every morning for 5 days per week    Lotensin 40 Mg Tabs (Benazepril hcl) .Marland Kitchen... 1 by mouth bid    Pindolol 5 Mg Tabs (Pindolol) .Marland Kitchen... 1/2 by mouth bid    Cardizem La 180 Mg Tb24 (Diltiazem hcl coated beads) .Marland Kitchen... 1 by mouth bid   Problem # 4:  DIABETES MELLITUS, TYPE II (ICD-250.00)  His updated medication list for this problem includes:    Ecotrin Low Strength 81 Mg Tbec (Aspirin) .Marland Kitchen... 1po qd    Lotensin 40 Mg Tabs (Benazepril hcl) .Marland Kitchen... 1 by mouth two times a day  stable by hx, declines labs today, labs reviewed from previous, f/u as above, cont same meds  His updated medication list for this problem includes:    Ecotrin Low Strength 81 Mg Tbec (Aspirin) .Marland Kitchen... 1po qd    Lotensin 40 Mg Tabs (Benazepril hcl) .Marland Kitchen... 1 by mouth bid   Complete Medication List: 1)  Albuterol 90 Mcg/act Aers (Albuterol) .... Inhale 2 puff using inhaler every twelve hours 2)  Ecotrin Low Strength 81 Mg Tbec (Aspirin) .Marland Kitchen.. 1po qd 3)  Pravachol 40 Mg Tabs (Pravastatin sodium) .Marland Kitchen.. 1 by mouth qd 4)  Hydrochlorothiazide 25 Mg Tabs (Hydrochlorothiazide) .... Take 1 tab by mouth every morning for 5 days per week 5)  Lotensin 40 Mg Tabs (Benazepril hcl) .Marland Kitchen.. 1 by mouth bid 6)  Pindolol 5 Mg Tabs  (Pindolol) .... 1/2 by mouth bid 7)  Cardizem La 180 Mg Tb24 (Diltiazem hcl coated beads) .Marland Kitchen.. 1 by mouth bid 8)  Omeprazole 20 Mg Cpdr (Omeprazole) .Marland Kitchen.. 1 by mouth once daily prn 9)  Tramadol Hcl 50 Mg Tabs (Tramadol hcl) .... 2 by mouth qid prn 10)  Cortisporin 3.5-10000-1 Susp (Neomycin-polymyxin-hc) .... Use asd 2 gtt qid for 10 days right ear   Patient Instructions: 1)  Use ear drops as directed 2)  Ear  wax removed today 3)  If hearing no better in 1 to 2 weeks, consider calling us to request referral to ENT physician 4)  Please schedule a follow-up appointment in 6 months.    Prescriptions: CORTISPORIN 3.5-10000-1  SUSP (NEOMYCIN-POLYMYXIN-HC) use asd 2 gtt qid for 10 days right ear  #1 bottle x 1   Entered and Authorized by:   Biagio Borg MD   Signed by:   Biagio Borg MD on 06/03/2007   Method used:   Print then Give to Patient   RxID:   [id number]  ]

## 2010-08-20 NOTE — Consult Note (Signed)
Summary: [facility]  [facility]   Imported By: Edmonia James 03/23/2008 12:29:43  _____________________________________________________________________  External Attachment:    Type:   Image     Comment:   External Document

## 2010-08-20 NOTE — Progress Notes (Signed)
Summary: Rx OTC  Phone Note Call from Patient Call back at Home Phone [phone]   Caller: Patient Summary of Call: Pt called requesting Rxs for OTC medications so he can satisfy his [employer] spending account. Pt is reqesting Prilosec, Antihistamine and Lactaid (He lost previous Rx) mailed to his home Initial call taken by: Crissie Sickles, [place],  Dec 13, 2009 9:56 AM  Follow-up for Phone Call        ok to robin to handle Follow-up by: Biagio Borg MD,  Dec 13, 2009 3:03 PM  Additional Follow-up for Phone Call Additional follow up Details #1::        Sent pts rx to home as requested. Additional Follow-up by: Sharon Seller,  Dec 13, 2009 3:18 PM    Prescriptions: LACTAID 1 three times a day as needed  #90 x 3   Entered by:   Shirlean Mylar Ewing   Authorized by:   Biagio Borg MD   Signed by:   Sharon Seller on 12/13/2009   Method used:   Print then Give to Patient   RxID:   [id number] [place] OTC 20 MG TBEC (OMEPRAZOLE MAGNESIUM) 1 once daily  #30 x 8   Entered by:   Sharon Seller   Authorized by:   Biagio Borg MD   Signed by:   Sharon Seller on 12/13/2009   Method used:   Print then Give to Patient   RxID:   [id number]

## 2010-08-20 NOTE — Progress Notes (Signed)
Summary: need rx for 90 day  Phone Note Call from Patient Call back at Home Phone [phone]   Caller: [phone] Call For: Jenny Reichmann Summary of Call: per pt need  a written  rx for  albuterol inhaler for 90 day supply with 3 refill  so he can send to [facility] Initial call taken by: Nonah Mattes,  June 28, 2007 3:13 PM  Follow-up for Phone Call        to ann to refill routine med Follow-up by: Biagio Borg MD,  June 28, 2007 3:50 PM      Prescriptions: ALBUTEROL 90 MCG/ACT AERS (ALBUTEROL) Inhale 2 puff using inhaler every twelve hours  #90 x 3   Entered by:   Tomma Lightning   Authorized by:   Biagio Borg MD   Signed by:   Tomma Lightning on 06/29/2007   Method used:   Printed then faxed to ...       [facility]             , [place]         Ph:        Fax: [phone]   RxID:   [id number]

## 2010-08-22 NOTE — Progress Notes (Signed)
Summary: Rx req  Phone Note Call from Patient Call back at Home Phone [phone]   Caller: Patient Summary of Call: Pt called requesting Rx for OTC medication to use with his flex spending account. Bayer Aspirin 81mg , Extra Strength Tylenol 500mg  and Chloraseptic sore throat spray. Initial call taken by: Crissie Sickles, [place],  August 12, 2010 1:51 PM  Follow-up for Phone Call        ok for asa - 1 once daily;  tylenol 500 three times a day as needed,  and throat spray as above - to robin to handle for however much he wants Follow-up by: Biagio Borg MD,  August 12, 2010 2:00 PM  Additional Follow-up for Phone Call Additional follow up Details #1::        called the patient and he requested prescriptions for each of the above OTC to send as proof to ins[employer] co[employer] for flex card. Patient will pickup prescriptions at front desk. Additional Follow-up by: Robin Ewing CMA Deborra Medina),  August 12, 2010 2:56 PM    New/Updated Medications: ASPIR-LOW 81 MG TBEC (ASPIRIN) 1 by mouth once daily TYLENOL EXTRA STRENGTH 500 MG TABS (ACETAMINOPHEN) 1 by mouth three times a day CHLORASEPTIC SORE THROAT 1000 MG/30ML LIQD (ACETAMINOPHEN) use as directed Prescriptions: CHLORASEPTIC SORE THROAT 1000 MG/30ML LIQD (ACETAMINOPHEN) use as directed  #1 x 0   Entered by:   Shirlean Mylar Ewing CMA (AAMA)   Authorized by:   Biagio Borg MD   Signed by:   Sharon Seller CMA ([place]) on 08/12/2010   Method used:   Print then Give to Patient   RxID:   [id number] [place] 500 MG TABS (ACETAMINOPHEN) 1 by mouth three times a day  #100 x 0   Entered by:   Shirlean Mylar Ewing CMA ([place])   Authorized by:   Biagio Borg MD   Signed by:   Sharon Seller CMA ([place]) on 08/12/2010   Method used:   Print then Give to Patient   RxID:   [id number] ASPIR-LOW 81 MG TBEC (ASPIRIN) 1 by mouth once daily  #200 x 0   Entered by:   Shirlean Mylar Ewing CMA ([place])   Authorized by:   Biagio Borg MD   Signed by:   Sharon Seller CMA  ([place]) on 08/12/2010   Method used:   Print then Give to Patient   RxID:   [id number]

## 2010-08-22 NOTE — Letter (Signed)
Summary: [facility]   Imported By: Phillis Knack 07/17/2010 12:24:25  _____________________________________________________________________  External Attachment:    Type:   Image     Comment:   External Document

## 2010-08-22 NOTE — Letter (Signed)
Summary: [facility]  [facility]   Imported By: Rise Patience 08/14/2010 14:45:14  _____________________________________________________________________  External Attachment:    Type:   Image     Comment:   External Document

## 2010-08-23 ENCOUNTER — Inpatient Hospital Stay (HOSPITAL_COMMUNITY)
Admission: RE | Admit: 2010-08-23 | Discharge: 2010-08-26 | DRG: 209 | Disposition: A | Discharge status: Home Health | Payer: BC Managed Care – PPO | Source: Ambulatory Visit | Attending: Orthopaedic Surgery | Admitting: Orthopaedic Surgery

## 2010-08-23 DIAGNOSIS — K219 Gastro-esophageal reflux disease without esophagitis: Secondary | ICD-10-CM | POA: Diagnosis present

## 2010-08-23 DIAGNOSIS — Z79899 Other long term (current) drug therapy: Secondary | ICD-10-CM

## 2010-08-23 DIAGNOSIS — I10 Essential (primary) hypertension: Secondary | ICD-10-CM | POA: Diagnosis present

## 2010-08-23 DIAGNOSIS — Z9889 Other specified postprocedural states: Secondary | ICD-10-CM

## 2010-08-23 DIAGNOSIS — J449 Chronic obstructive pulmonary disease, unspecified: Secondary | ICD-10-CM | POA: Diagnosis present

## 2010-08-23 DIAGNOSIS — M1710 Unilateral primary osteoarthritis, unspecified knee: Principal | ICD-10-CM | POA: Diagnosis present

## 2010-08-23 DIAGNOSIS — J4489 Other specified chronic obstructive pulmonary disease: Secondary | ICD-10-CM | POA: Diagnosis present

## 2010-08-23 DIAGNOSIS — Z8546 Personal history of malignant neoplasm of prostate: Secondary | ICD-10-CM

## 2010-08-23 DIAGNOSIS — Z7982 Long term (current) use of aspirin: Secondary | ICD-10-CM

## 2010-08-23 DIAGNOSIS — Z23 Encounter for immunization: Secondary | ICD-10-CM

## 2010-08-23 DIAGNOSIS — E119 Type 2 diabetes mellitus without complications: Secondary | ICD-10-CM | POA: Diagnosis present

## 2010-08-24 ENCOUNTER — Inpatient Hospital Stay (HOSPITAL_COMMUNITY): Payer: BC Managed Care – PPO

## 2010-08-24 LAB — CBC
HCT: 33.2 % — ABNORMAL LOW (ref 39.0–52.0)
Hemoglobin: 10.8 g/dL — ABNORMAL LOW (ref 13.0–17.0)
MCH: 27.6 pg (ref 26.0–34.0)
MCHC: 32.5 g/dL (ref 30.0–36.0)
MCV: 84.7 fL (ref 78.0–100.0)
Platelets: 181 10*3/uL (ref 150–400)
RBC: 3.92 MIL/uL — ABNORMAL LOW (ref 4.22–5.81)
RDW: 13.6 % (ref 11.5–15.5)
WBC: 7.2 10*3/uL (ref 4.0–10.5)

## 2010-08-24 LAB — BASIC METABOLIC PANEL
BUN: 18 mg/dL (ref 6–23)
CO2: 25 mEq/L (ref 19–32)
Calcium: 8.5 mg/dL (ref 8.4–10.5)
Chloride: 98 mEq/L (ref 96–112)
Creatinine, Ser: 1.36 mg/dL (ref 0.4–1.5)
GFR calc Af Amer: >60 mL/min (ref >60)
GFR calc non Af Amer: 53 mL/min — ABNORMAL LOW (ref >60)
Glucose, Bld: 115 mg/dL — ABNORMAL HIGH (ref 70–99)
Potassium: 4.4 mEq/L (ref 3.5–5.1)
Sodium: 133 mEq/L — ABNORMAL LOW (ref 135–145)

## 2010-08-24 LAB — PROTIME-INR
INR: 1.15 (ref 0.00–1.49)
Prothrombin Time: 14.9 seconds (ref 11.6–15.2)

## 2010-08-25 LAB — CBC
HCT: 30 % — ABNORMAL LOW (ref 39.0–52.0)
Hemoglobin: 9.7 g/dL — ABNORMAL LOW (ref 13.0–17.0)
MCH: 27.4 pg (ref 26.0–34.0)
MCHC: 32.3 g/dL (ref 30.0–36.0)
MCV: 84.7 fL (ref 78.0–100.0)
Platelets: 150 10*3/uL (ref 150–400)
RBC: 3.54 MIL/uL — ABNORMAL LOW (ref 4.22–5.81)
RDW: 13.4 % (ref 11.5–15.5)
WBC: 8.6 10*3/uL (ref 4.0–10.5)

## 2010-08-25 LAB — BASIC METABOLIC PANEL
BUN: 13 mg/dL (ref 6–23)
CO2: 25 mEq/L (ref 19–32)
Calcium: 8.4 mg/dL (ref 8.4–10.5)
Chloride: 94 mEq/L — ABNORMAL LOW (ref 96–112)
Creatinine, Ser: 1.21 mg/dL (ref 0.4–1.5)
GFR calc Af Amer: >60 mL/min (ref >60)
GFR calc non Af Amer: >60 mL/min (ref >60)
Glucose, Bld: 111 mg/dL — ABNORMAL HIGH (ref 70–99)
Potassium: 4.1 mEq/L (ref 3.5–5.1)
Sodium: 128 mEq/L — ABNORMAL LOW (ref 135–145)

## 2010-08-25 LAB — PROTIME-INR
INR: 1.61 — ABNORMAL HIGH (ref 0.00–1.49)
Prothrombin Time: 19.3 seconds — ABNORMAL HIGH (ref 11.6–15.2)

## 2010-08-26 LAB — CBC
HCT: 29.1 % — ABNORMAL LOW (ref 39.0–52.0)
Hemoglobin: 9.6 g/dL — ABNORMAL LOW (ref 13.0–17.0)
MCH: 28 pg (ref 26.0–34.0)
MCHC: 33 g/dL (ref 30.0–36.0)
MCV: 84.8 fL (ref 78.0–100.0)
Platelets: 138 10*3/uL — ABNORMAL LOW (ref 150–400)
RBC: 3.43 MIL/uL — ABNORMAL LOW (ref 4.22–5.81)
RDW: 13.3 % (ref 11.5–15.5)
WBC: 7.2 10*3/uL (ref 4.0–10.5)

## 2010-08-26 LAB — BASIC METABOLIC PANEL
BUN: 15 mg/dL (ref 6–23)
CO2: 26 mEq/L (ref 19–32)
Calcium: 8.5 mg/dL (ref 8.4–10.5)
Chloride: 95 mEq/L — ABNORMAL LOW (ref 96–112)
Creatinine, Ser: 1.24 mg/dL (ref 0.4–1.5)
GFR calc Af Amer: >60 mL/min (ref >60)
GFR calc non Af Amer: 59 mL/min — ABNORMAL LOW (ref >60)
Glucose, Bld: 114 mg/dL — ABNORMAL HIGH (ref 70–99)
Potassium: 4 mEq/L (ref 3.5–5.1)
Sodium: 130 mEq/L — ABNORMAL LOW (ref 135–145)

## 2010-08-26 LAB — PROTIME-INR
INR: 1.63 — ABNORMAL HIGH (ref 0.00–1.49)
Prothrombin Time: 19.5 seconds — ABNORMAL HIGH (ref 11.6–15.2)

## 2010-09-02 ENCOUNTER — Telehealth: Payer: Self-pay | Admitting: Internal Medicine

## 2010-09-11 NOTE — Progress Notes (Signed)
  Phone Note Refill Request Message from:  Fax from Pharmacy on September 02, 2010 8:51 AM  Refills Requested: Medication #1:  PRILOSEC OTC 20 MG TBEC 1 once daily   Dosage confirmed as above?Dosage Confirmed   Notes: [facility]. Initial call taken by: Robin Ewing CMA (AAMA),  September 02, 2010 8:52 AM    Prescriptions: OMEPRAZOLE 20 MG  CPDR (OMEPRAZOLE) 1 by mouth once daily as needed  #90 x 1   Entered by:   Sharon Seller CMA ([place])   Authorized by:   Biagio Borg MD   Signed by:   Sharon Seller CMA ([place]) on 09/02/2010   Method used:   Faxed to ...       [facility] [phone]* (retail)       [place], [place]  [id number]       Ph: [phone] or [phone]       Fax: [phone]   RxID:   [id number]

## 2010-09-19 HISTORY — PX: PENILE PROSTHESIS PLACEMENT: SHX739

## 2010-09-26 NOTE — Discharge Summary (Signed)
[name], [name]         ACCOUNT NO.:  [id number]  MEDICAL RECORD NO.:  [id number]           PATIENT TYPE:  I  LOCATION:  [id number]                         FACILITY:  [facility]  PHYSICIAN:  Mark C. Lorin Mercy, M.D.    DATE OF BIRTH:  Nov 15, 1945  DATE OF ADMISSION:  08/23/2010 DATE OF DISCHARGE:  08/26/2010                              DISCHARGE SUMMARY   ADMISSION DIAGNOSES: 1. Left knee osteoarthritis. 2. History of prostate cancer.  DISCHARGE DIAGNOSES: 1. Left knee osteoarthritis. 2. History of prostate cancer. 3. Mild posthemorrhagic anemia.  PROCEDURE:  On August 23, 2010, the patient underwent left total knee arthroplasty performed by Dr. Lorin Mercy, assisted by Phillips Hay, PA.  CONSULTATIONS:  None.  BRIEF HISTORY:  The patient is a 65 year old male with chronic and progressive left knee pain secondary to osteoarthritis.  He has failed conservative treatment and wishes to proceed with a left total knee arthroplasty.  BRIEF HOSPITAL COURSE:  The patient tolerated the procedure without difficulties.  Postoperatively, neurovascular motor function of the lower extremities was noted to be intact.  He was started on the usual physical therapy program for ambulation and gait training, weightbearing as tolerated on the operative extremity.  Range of motion, stretching, and strengthening exercises were performed by the physical therapist. He was able to ambulate in the hallway prior to discharge.  He was placed on Coumadin for DVT prophylaxis.  Adjustments in Coumadin dose made according to daily protimes by the pharmacist at [facility]. Postoperative hemoglobin dropped to 9.7 and was stable.  The patient did not require a blood transfusion.  The patient was able to void once his Foley catheter was discontinued.  PCA analgesics were used initially for pain control and he was weaned to p.o. analgesics without difficulty. The patient was taking a regular diet.  He was afebrile.   Vital signs were stable at the time of discharge.  PERTINENT LABORATORY VALUES:  Admission CBC with hemoglobin 12.5, hematocrit 39.  Hemoglobin and hematocrit dropped to 9.6 and 29.1 postoperatively.  INR at discharge 1.63.  Chemistry studies within normal limits.  Urinalysis on admission negative for urinary tract infection.  PLAN:  The patient was discharged to his home.  He was given instructions to change his dressing as needed.  He will follow up with Dr. Lorin Mercy in 2 weeks.  He will wear his knee immobilizer until he is able to do a straight leg raise.  He will use ice as needed.  Resume a regular diet.  MEDICATIONS AT DISCHARGE:  The patient was given Robaxin 500 mg one every 6-8 hours as needed for spasm, Percocet 5/325 one to two every 4-6 hours as needed for pain, Coumadin per pharmacy instructions.  He will resume all other home medications with the exception of aspirin.  All questions encouraged and answered.  CONDITION ON DISCHARGE:  Stable.     Epimenio Foot, P.A.   ______________________________ Thana Farr Lorin Mercy, M.D.    SMV/MEDQ  D:  09/11/2010  T:  09/12/2010  Job:  [id number]  Electronically Signed by Phillips Hay P.A. on 09/23/2010 08:49:48 AM Electronically Signed by Rodell Perna M.D. on 09/24/2010 09:02:27 PM

## 2010-10-02 ENCOUNTER — Telehealth: Payer: Self-pay | Admitting: Internal Medicine

## 2010-10-08 NOTE — Progress Notes (Signed)
Summary: medication refill  Phone Note Refill Request Message from:  Fax from Pharmacy on October 02, 2010 1:05 PM  Refills Requested: Medication #1:  HYDROCODONE-ACETAMINOPHEN 5-325 MG TABS 1 - 2 by mouth two times a day as needed   Dosage confirmed as above?Dosage Confirmed   Last Refilled: 05/02/2010   Notes: [facility] Initial call taken by: Shirlean Mylar Ewing CMA Deborra Medina),  October 02, 2010 1:06 PM  Follow-up for Phone Call        done hardcopy to LIM side B - dahlia  Follow-up by: Biagio Borg MD,  October 02, 2010 1:08 PM  Additional Follow-up for Phone Call Additional follow up Details #1::        faxed hardcopy to pharmacy Additional Follow-up by: Robin Ewing CMA Deborra Medina),  October 02, 2010 1:47 PM    New/Updated Medications: HYDROCODONE-ACETAMINOPHEN 5-325 MG TABS (HYDROCODONE-ACETAMINOPHEN) 1 - 2 by mouth two times a day as needed Prescriptions: HYDROCODONE-ACETAMINOPHEN 5-325 MG TABS (HYDROCODONE-ACETAMINOPHEN) 1 - 2 by mouth two times a day as needed  #60 x 2   Entered and Authorized by:   Biagio Borg MD   Signed by:   Biagio Borg MD on 10/02/2010   Method used:   Print then Give to Patient   RxID:   [id number]

## 2010-10-14 ENCOUNTER — Ambulatory Visit (HOSPITAL_BASED_OUTPATIENT_CLINIC_OR_DEPARTMENT_OTHER)
Admission: RE | Admit: 2010-10-14 | Discharge: 2010-10-15 | Disposition: A | Discharge status: Home / Self Care | Payer: BC Managed Care – PPO | Source: Ambulatory Visit | Attending: Urology | Admitting: Urology

## 2010-10-14 DIAGNOSIS — Z9079 Acquired absence of other genital organ(s): Secondary | ICD-10-CM | POA: Insufficient documentation

## 2010-10-14 DIAGNOSIS — Z01812 Encounter for preprocedural laboratory examination: Secondary | ICD-10-CM | POA: Insufficient documentation

## 2010-10-14 DIAGNOSIS — N529 Male erectile dysfunction, unspecified: Secondary | ICD-10-CM | POA: Insufficient documentation

## 2010-10-14 LAB — GLUCOSE, CAPILLARY: Glucose-Capillary: 118 mg/dL — ABNORMAL HIGH (ref 70–99)

## 2010-10-14 LAB — POCT I-STAT 4, (NA,K, GLUC, HGB,HCT)
Glucose, Bld: 127 mg/dL — ABNORMAL HIGH (ref 70–99)
HCT: 39 % (ref 39.0–52.0)
Hemoglobin: 13.3 g/dL (ref 13.0–17.0)
Potassium: 4.1 mEq/L (ref 3.5–5.1)
Sodium: 139 mEq/L (ref 135–145)

## 2010-10-29 NOTE — Op Note (Signed)
[name], [name]         ACCOUNT NO.:  [id number]  MEDICAL RECORD NO.:  [id number]           PATIENT TYPE:  I  LOCATION:  [id number]                         FACILITY:  [facility]  PHYSICIAN:  [name], M.D.  DATE OF BIRTH:  1946/05/24  DATE OF PROCEDURE:  10/14/2010 DATE OF DISCHARGE:  08/26/2010                              OPERATIVE REPORT   PREOPERATIVE DIAGNOSIS:  Organic erectile dysfunction.  POSTOPERATIVE DIAGNOSIS:  Organic erectile dysfunction.  PROCEDURE:  Three-piece inflatable penile prosthesis (AMS 700).  SURGEON:  Parker C. Karsten Ro, MD  ANESTHESIA:  General.  SPECIMENS:  None.  BLOOD LOSS:  Minimal.  DRAINS:  16-French Foley catheter.  COMPLICATIONS:  None.  INDICATIONS:  The patient is a 65 year old male who had a radical prostatectomy in February 2007.  He developed erectile dysfunction and failed phosphodiesterase inhibitor therapy as well as intraurethral suppositories.  He has tried intracavernosal injections but this has resulted in multiple episodes of priapism.  We therefore discussed the treatment options and he has elected to proceed with penile implant. The risks, complications, alternatives, and limitations have been discussed.  DESCRIPTION OF OPERATION:  After informed consent, the patient was brought to the major OR, placed on the table, administered general anesthesia.  He received 1 g of ampicillin and 80 mg of gentamicin IV preoperatively.  He then underwent a full 10-minute Hibiclens scrub followed by a DuraPrep prep.  He was then sterilely draped and an official time-out was then performed.  A 16-French Foley catheter was placed in the bladder and the bladder was drained.  I then made a midline scrotal median raphe scrotal incision and then used blunt technique to dissect down to the corpora on the right and left sides.  Stay sutures were then placed in the corpora and a retractor was placed in order to afford exposure.  I then  incised the right corpus cavernosum with the scalpel and extended this with the curved Mayo scissors.  I passed the Strully scissors proximally and distally through the corporotomy with the curve laterally placed and then dilated with the Hegar dilators from 8-13.  This was followed by irrigation of the corpora with antibiotic solution.  I then measured the corpora proximally and distally and found these to be 12 cm each.  I then irrigated with antibiotic solution again and directed my attention to the contralateral corpus cavernosum.  It was treated in an identical fashion with identical measurements being noted.  I then chose a 100 mL reservoir and was able to palpate the external inguinal ring on the right hand side where I could feel the spermatic cord laterally.  The bladder was drained completely and I used a heavy clamp to pierce the floor of the inguinal canal.  I then used digital dissection to dissect the space of Retzius behind the symphysis pubis. This proceeded without difficulty.  The emptied reservoir was then placed in this location and antibiotic solution was used to irrigate the location as well.  I then filled the reservoir with 100 mL of sterile saline.  No back pressure was noted.  A shod clamp was then applied and attention redirected to the  corporotomies in order to implant the cylinders.  He had 21 cm cylinders with 3 cm rear tip extenders were chosen for both sides.  These were soaked in antibiotic solution and then I used a Keith needle and furlough inserter to pass the suture affixed to the distal aspect of the cylinder up the right corpus cavernosum and then directed the Downs needle out through the glans.  This was performed on the right side and then left side in identical fashion.  I then placed the proximal portions with the rear tip extenders and then inflated the device.  This resulted in an excellent, straight erection with both of the cylinders  palpable equidistant distally.  His anatomy was such that his corpus cavernosum did not extend out into the proximal glans region but actually ended just short of the glans.  Despite this, there was no SST or tilt deformity.  No kinking of the cylinders was noted either and it appeared to be in good position.  I therefore deflated the device and closed the corporotomies after irrigating again with antibiotic solution.  The corporotomies were closed with running 2-0 Vicryl suture. The stay sutures were then tied across the midline and I developed the pocket for the pump on the right-hand side.  I then placed a shodded hemostat next to the pump on the black tubing and excised the excess tubing.  This was performed on the tubing coming from the reservoir as well and the tubing was then connected with the supplied connectors in the standard fashion.  This left no redundant tubing.  I then inflated the device a second time to be sure that the device was functioning properly and it cycled properly and filled completely.  I then emptied about half of the cylinders fluid to leave some fluid in, in order to tamponade the corpora and placed the pump in the right hemiscrotum.  A 3- 0 chromic was used to situate a scrotal tissue between the tubing in order to maintain the pump in the right hemiscrotum position.  I then irrigated the scrotum copiously with antibiotic solution and then closed the deep scrotal tissue in 2 layers with running 3-0 chromic suture.  I then closed the skin with running 3-0 chromic and applied triple antibiotic ointment as well as a dry gauze dressing.  I then used Kerlix in a mummy wrap to wrap the scrotum and penis.  The Foley catheter was then connected to close system drainage and the patient was awakened and taken to recovery room in stable and satisfactory condition.  He tolerated procedure well.  There are no intraoperative complications. He will be observed overnight  and administered intravenous antibiotics with plans to discharge home in the morning with antibiotics, pain medication, and written instructions.     [name], M.D.     MCO/MEDQ  D:  10/14/2010  T:  10/14/2010  Job:  [id number]  Electronically Signed by Kathie Rhodes M.D. on 10/29/2010 06:48:54 AM

## 2010-12-02 ENCOUNTER — Ambulatory Visit (HOSPITAL_BASED_OUTPATIENT_CLINIC_OR_DEPARTMENT_OTHER): Admission: RE | Admit: 2010-12-02 | Payer: BC Managed Care – PPO | Source: Ambulatory Visit | Admitting: Urology

## 2010-12-03 NOTE — Assessment & Plan Note (Signed)
[place] OFFICE NOTE   PANTELIS, CASTRILLO                  MRN:          [id number]  DATE:04/22/2007                            DOB:          1946-05-18    REFERRING PHYSICIAN:  Biagio Borg, MD   REASON FOR CONSULTATION:  Rectal bleeding.   [name] is a pleasant 66 year old African-American male referred  through the courtesy of Dr. Jenny Reichmann for evaluation.  On several occasions  ,he has had small amounts of bright red blood surrounding his stool.  He  denies rectal or abdominal pain.  He is attributing his bleeding to  hemorrhoids.  Last colonoscopy in 2000 demonstrated diverticulosis.   PAST MEDICAL HISTORY:  Pertinent for hypertension and prostate cancer.  He is status post prostatectomy.  He has kidney stones, arthritis.   FAMILY HISTORY:  Pertinent for a father with prostate cancer and both  parents and brother with heart disease.   MEDICATIONS:  Baby aspirin.  Pabanol.  Hydrochlorothiazide.  Lisinopril.   There are no allergies.   He does not smoke.  He drinks rarely.  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   REVIEW OF SYSTEMS:  Reviewed and is negative.   EXAM:  Pulse 72, blood pressure 130/74, weight 240.  HEENT:  EOMI. PERRLA. Sclerae are anicteric.  Conjunctivae are pink.  NECK:  Supple without thyromegaly, adenopathy or carotid bruits.  CHEST:  Clear to auscultation and percussion without adventitious  sounds.  CARDIAC:  Regular rhythm; normal S1 S2.  There are no murmurs, gallops  or rubs.  ABDOMEN:  Bowel sounds are normoactive.  Abdomen is soft, non-tender and  non-distended.  There are no abdominal masses, tenderness, splenic  enlargement or hepatomegaly.  EXTREMITIES:  Full range of motion.  No cyanosis, clubbing or edema.  RECTAL:  Deferred.   IMPRESSION:  1. Limited rectal bleeding - most likely secondary to hemorrhoids.      More proximal  chronic bleeding source ought to be ruled out.  2. Diverticulosis.  3. History of prostate cancer.  4. Hypertension.   RECOMMENDATION:  Colonoscopy.     Sandy Salaam. Deatra Ina, MD,FACG  Electronically Signed    RDK/MedQ  DD: 04/22/2007  DT: 04/22/2007  Job #: [id number]   cc:   Biagio Borg, MD

## 2010-12-03 NOTE — Assessment & Plan Note (Signed)
[place]                                 ON-CALL NOTE   MALIEK, DUNDEE                  MRN:          [id number]  DATE:01/21/2008                            DOB:          [birth year]    TIME RECEIVED:  5:50 p.m.   CALLER:  Bryson Ha with [facility].  He sees Dr. Jenny Reichmann.   TELEPHONE:  [phone].   The caller reports to me that the patient had a couple of blood cultures  drawn in the office yesterday.  Now, one of these cultures is growing  gram-negative rods.  As the lab technician did not know anything else  about the patient, she did give me his home number at [phone].  I then  contacted the patient at home.  He told me that he was seen in Dr.  Gwynn Burly office yesterday and diagnosed with a urinary tract infection.  He was put on some type of oral antibiotic, but also had a couple of  blood cultures drawn.  His symptoms have been some generalized weakness  and not feeling well for the last 3 days.  He denies any other  specific symptoms at all.  Specifically, no vomiting, no fever, etc.  I  told him that he now has evidence of sepsis with infection in his blood  stream, which can be quite serious.  I think he needs to be admitted to  the hospital for IV antibiotics and he understands.  I directed him to  have his wife drive him immediately to [facility] Emergency Room.  I did  speak to the triage nurse at [facility] Emergency Room to inform them of  the situation before he arrived.     Ishmael Holter. Sarajane Jews, MD  Electronically Signed    SAF/MedQ  DD: 01/21/2008  DT: 01/21/2008  Job #: [id number]

## 2010-12-03 NOTE — Discharge Summary (Signed)
NAMEBRENTTON, [name]         ACCOUNT NO.:  [id number]   MEDICAL RECORD NO.:  [id number]          PATIENT TYPE:  INP   LOCATION:  5150                         FACILITY:  [facility]   PHYSICIAN:  Valerie A. Asa Lente, MDDATE OF BIRTH:  1945-08-03   DATE OF ADMISSION:  01/21/2008  DATE OF DISCHARGE:  01/24/2008                               DISCHARGE SUMMARY   DISCHARGE DIAGNOSES:  1. Urinary tract infection with Escherichia coli sepsis.  2. History of prostate cancer, status post prostatectomy.  3. Hypertension.  4. Chronic renal insufficiency with stable creatinine and normal renal      ultrasound during this admission.   HISTORY OF PRESENT ILLNESS:  [name] is a 65 year old male who  was admitted on January 21, 2008, after a 2-3 day history of felling ill  secondary to episodes of shaking and temperature of 102.  He also noted  feeling sluggish.  He saw Dr. Jenny Reichmann the day prior to admission.  He was  started on Cipro.  He took his first dose on the morning of this  admission and the second dose on the evening of the admission.  [facility] grew Gram-negative rods in his blood culture.   COURSE OF HOSPITALIZATION:  Urinary tract infection with Escherichia  coli sepsis.  The patient was admitted and was started on IV ceftazidime  and he continued to improve clinically.  He was then transitioned over  to oral Suprax and has remained stable.  At this time, we plan to  discharge the patient home on continued oral antibiotics.  We plan to  continue it for a total of 10-day treatment.  Also of note, the patient  underwent a renal ultrasound which was negative for hydro.   MEDICATIONS AT THE TIME OF DISCHARGE:  1. Aspirin 81 mg p.o. daily.  2. Benazepril 40 mg p.o. daily.  3. Pravastatin 40 mg p.o. daily.  4. Pindolol 5 mg twice daily.  5. Triamterene/HCTZ 37.5/25 one tab p.o. daily.  6. Diltiazem XR 180 mg p.o. b.i.d.  7. Prilosec OTC 1 tab p.o. daily.  8. Tramadol HCl 50 mg tabs  2 tabs 4 times daily as needed.  9. Suprax 400 mg p.o. daily, till January 30, 2008, and then stop.   DISPOSITION:  The patient will discharged to home.   FOLLOWUP:  He is instructed to follow up with Dr. Cathlean Cower on Tuesday,  February 01, 2008, at 11:15 a.m.  He is instructed to call Dr. Jenny Reichmann if he  developed fever 101, chills, or weakness.   Greater than 30 minutes spent on discharge planning.      Debbrah Alar, NP      Jannifer Rodney. Asa Lente, MD  Electronically Signed    MO/MEDQ  D:  01/24/2008  T:  01/25/2008  Job:  [id number]   cc:   Biagio Borg, MD

## 2010-12-03 NOTE — H&P (Signed)
[name], [name]         ACCOUNT NO.:  [id number]   MEDICAL RECORD NO.:  [id number]          PATIENT TYPE:  INP   LOCATION:  1823                         FACILITY:  [place]   PHYSICIAN:  Evie Lacks. Plotnikov, MDDATE OF BIRTH:  1945/09/08   DATE OF ADMISSION:  01/21/2008  DATE OF DISCHARGE:                              HISTORY & PHYSICAL   CHIEF COMPLAINT:  Abnormal labs.   HISTORY OF PRESENT ILLNESS:  The patient is a 65 year old male who has  been sick for 2-3 days with two episodes of shaking chills and  temperature of 102, the last episode yesterday morning.  He has been  feeling sluggish somewhat.  He denies chest pain, abdominal pain, back  pain, urinary symptoms, or respiratory symptoms.  He is complaining of  chronic muscle aches and pains.  He saw Dr. Jenny Reichmann yesterday and was  started on Cipro.  He took his first dose this morning and the second  dose tonight.  Currently, in [facility], grew out gram-negative rods  out of his blood cultures and he was advised to go to ER to be admitted.   PAST MEDICAL HISTORY:  1. Hypertension.  2. Prostate cancer status post prostatectomy in 2007.  3. Dyslipidemia.  4. Hairy-cell dysfunction.   SOCIAL HISTORY:  Drinks limited amount of alcohol.  Lives with daughter.  Nonsmoker.   ALLERGIES:  None.   FAMILY HISTORY:  Positive for hypertension,   CURRENT MEDICATIONS:  1. Pravachol 40 mg daily.  2. Aspirin 81 mg daily.  3. Benazepril 40 mg daily.  4. Diltiazem 180 b.i.d.  5. Trimethoprim/HCTZ 37.5/50 mg daily.  6. Pindolol 5 mg daily.   REVIEW OF SYSTEMS:  Denies chest pain or shortness of breath.  Shaking  chills and fever episodes as above.  Sluggishness as above.  No syncopal  spells.  No headaches, joint aches and pains, chronic low back pain, or  neck pain.  No urinary symptoms.  The rest of the 10-point review of  systems is negative or as above.   PHYSICAL EXAMINATION:  Temperature 98.5, blood pressure 128/73,  heart  rate 82, respirations 20, and sats 100% on room air.  He is in no acute distress.  HEENT:  Slightly dryish oral mucosa.  NECK:  Supple.  No meningeal signs.  LUNGS:  Clear.  No wheezes or rales.  HEART:  S1 and S2.  No gallop.  No murmur.  ABDOMEN:  Soft and nontender.  No organomegaly and no mass felt.  LOWER EXTREMITIES:  Without edema.  Calves nontender.  He is alert, oriented, and cooperative.  Joints are without deformities.  RECTAL:  Per emergency room physician.   LABORATORY DATA:  White count 5.5, hemoglobin 11.9, MCV 86.6, and  platelets 155.  Sodium 137, potassium 4.0, creatinine 1.6, BUN 19,  glucose 118, and calcium ionized 1.10.  Urinalysis, 0-2 wbc's, leukocyte  esterase trace.  Chest x-ray with cardiomegaly and no edema.   ASSESSMENT/PLAN:  1. Urosepsis.  Start on IV fluids.  Start ceftazidime 1 g IV q.12 h.  2. Gram negative rods in blood culture from [facility].  Plan as  above.  3. Prostate cancer status post prostatectomy.  We will obtain      abdominal ultrasound.  He states his recent urological checkup and      PSA were fine.  4. Hypertension.  Hold diuretics.  5. Dyslipidemia.  We will hold Pravachol.  6. Chronic renal insufficiency, mild.      Evie Lacks. Plotnikov, MD  Electronically Signed     AVP/MEDQ  D:  01/21/2008  T:  01/22/2008  Job:  [id number]   cc:   Biagio Borg, MD

## 2010-12-06 NOTE — Discharge Summary (Signed)
[place]  Patient:    [name], [name] Visit Number: [id number] MRN: [id number]          Service Type: MED Location: [phone] Attending Physician:  Epifanio Lesches Dictated by:   York Grice, P.A. Admit Date:  09/21/2001 Discharge Date: 09/23/2001   CC:         Richard A. Rollene Fare, M.D.   Discharge Summary  DATE OF BIRTH:  May 23, 1946.  ADMITTING PHYSICIAN:  Bryson Dames, M.D.  DISCHARGE DIAGNOSES: 1. Chest pain, atypical, ruled out for myocardial infarction, status post    catheterization which revealed essentially normal coronary arteries. 2. Dyspnea. 3. Abnormal cardiac enzymes. 4. Hypertension, poor control. 5. Concentric left ventricular hypertrophy. 6. Favorable lipid profile.  HISTORY OF PRESENT ILLNESS:  The patient is a 65 year old gentleman who presented to the emergency department with complaint of chest pain and shortness of breath, palpitations.  He has been assessed by Dr. Melvern Banker at that time the patient had history of chest pain and still had shortness of breath. He reported that it started after he was doing weight lifting. The patient reported that he was a patient of Dr. Rollene Fare for almost 20 years. Last time he was seen was two to three years ago and at the time of presentation his cardiac enzymes were abnormal with CK of 492, MB 17.1, troponin 0.02.  His electrocardiogram also revealed on admission abnormal findings.  Patient was in sinus rhythm with first degree AV block and had depression of T waves in leads I, aVL, V3 through V6 and ST-elevation in leads V1, V2.  The patient was relatively stable at the time of admission, chest pain free but with dyspnea and with abnormal CK-MB levels and electrocardiogram he was admitted for observation and for catheterization for definitive diagnoses.  HOSPITAL PROCEDURE:  Cardiac catheterization performed by Dr. Claiborne Billings on 09/23/2001.  Procedure  revealed essentially normal coronary arteries.  The patient tolerated the procedure well.  After his rest period post catheterization expired the patient was discharged home.  His electrocardiogram at the time of discharge was changed with inversion of the T waves noted before in lead V4 changed to upright T waves but he still had inverted T waves in leads 1, aVL, leads V5, V6 and lead II.  LABORATORY DATA, PERTINENT STUDIES:  First set CK-MB panel was total CK 492, MB 17.1, troponin 0.02.  Second set was 357, total CK-MB was 12.5 and troponin remained unchanged.  BASIC METABOLIC PANEL:  Showed potassium 3.8, creatinine 0.9.  CBC:  Normal hemoglobin 13.7, hematocrit 40.5.  LIPID PROFILE:  Cholesterol total 165, triglycerides 62, HDL 51, LDL 102.  CHEST X-RAY:  Mild cardiomegaly and no acute findings.  The patient was discharged home in stable condition.  DISCHARGE INSTRUCTIONS: 1. No driving, no lifting more than 5 pounds, no strenuous physical activity    for three days. 2. Discharge diet low fat, low cholesterol diet. 3. Patient is allowed to shower, instructed not to wrap the groin puncture    site but wash it gently and pat dry.  Patient is to report any signs of    bleeding, oozing, increased pain, swelling to our office and the    number was provided. 4. Patient will be seen in follow up by Dr. Rollene Fare on 10/12/2001 at    9:00 a.m.  DISCHARGE MEDICATIONS: 1. Metoprolol 25 mg one p.o. b.i.d. 2. Diltiazem 240 mg q.d. 3. Altace 20 mg q.d. 4. Aspirin 325 mg p.o.  q.d. 5. Nitroglycerin 0.4 mg p.r.n.  For more detailed information on catheterization report, please refer to Dr. Ermalinda Memos dictation of that procedure since dictation is unavailable to me at the time of the discharge. Dictated by:   York Grice, P.A. Attending Physician:  Epifanio Lesches DD:  09/23/01 TD:  09/26/01 Job: [id number]

## 2010-12-06 NOTE — Op Note (Signed)
[name], [name]         ACCOUNT NO.:  [id number]   MEDICAL RECORD NO.:  [id number]          PATIENT TYPE:  AMB   LOCATION:  [facility]                         [facility]ACILITY:  [facility]   PHYSICIAN:  Marshall Cork. Jeffie Pollock, M.D.    DATE OF BIRTH:  23-Nov-1945   DATE OF PROCEDURE:  07/03/2005  DATE OF DISCHARGE:                                 OPERATIVE REPORT   PROCEDURE:  Transrectal ultrasound with ultrasound-guided prostate biopsy.   PREOPERATIVE DIAGNOSIS:  Elevated PSA.   POSTOPERATIVE DIAGNOSIS:  Elevated PSA.   SURGEON:  Dr. Irine Seal.   ANESTHESIA:  General.   SPECIMEN:  Prostate biopsies.   COMPLICATIONS:  None.   INDICATIONS:  [name] is a 65 year old black male with an elevated  PSA who was unable to undergo a prostate biopsy in the office due to rectal  tone.   FINDINGS AND PROCEDURE:  The patient was given p.o. Cipro preoperatively.  This was augmented with a gram of Rocephin IV during the procedure. A  general anesthetic was induced. He was then placed in the left lateral  decubitus position.   The ultrasound probe was assembled and inserted and scanning was performed.  Examination revealed normal seminal vesicles. The prostate was symmetrical  with a volume of 74 mL. The peripheral zone had no obvious hypoechoic areas  . The transitional zone had normal __________ echotexture was some  enlargement. There was extensive calcification in the transitional zone  particularly on the right side of the prostate.   After completion of diagnostic scan, a 12 core pattern biopsy was obtained  with specimen sent from the right base, the right mid and right apical  regions, the left base, left mid and left apical regions. He tolerated the  procedure well without complications. His anesthetic was reversed, he was  moved to the recovery room in stable condition and there were no  complication.      Marshall Cork. Jeffie Pollock, M.D.  Electronically Signed     JJW/MEDQ  D:  07/03/2005   T:  07/04/2005  Job:  [id number]   cc:   Biagio Borg, M.D. [facility]  [address]  [place] [address]

## 2010-12-06 NOTE — Op Note (Signed)
[name], [name]         ACCOUNT NO.:  [id number]   MEDICAL RECORD NO.:  [id number]          PATIENT TYPE:  INP   LOCATION:  1419                         FACILITY:  [facility]   PHYSICIAN:  Raynelle Bring, MD      DATE OF BIRTH:  11/09/1945   DATE OF PROCEDURE:  09/17/2005  DATE OF DISCHARGE:                                 OPERATIVE REPORT   PREOPERATIVE DIAGNOSIS:  Clinically localized adenocarcinoma of prostate.   POSTOPERATIVE DIAGNOSIS:  Clinically localized adenocarcinoma of prostate.   PROCEDURE:  Robotic-assisted laparoscopic radical prostatectomy (bilateral  nerve-sparing).   SURGEON:  Raynelle Bring, M.D.   ASSISTANT:  Rana Snare, M.D.   ANESTHESIA:  General.   COMPLICATIONS:  None.   ESTIMATED BLOOD LOSS:  300 mL.   INTRAVENOUS FLUIDS:  2000 mL of lactated Ringer's.   SPECIMEN:  Prostate and seminal vesicles.   DRAINS:  1.  A #19 Blake pelvic drain.  2.  A 20-French coude catheter.   INDICATION:  [name] is a 65 year old gentleman who was recently  diagnosed with clinical stage T1c prostate cancer with a PSA of 5.50 and a  Gleason score 3 + 3 = 6 involving 30% of one core on the right side of his  prostate.  His preoperative AUA symptom score was 23 with an IIEF score of  21.  After discussing management options for clinically localized prostate  cancer, the patient elected to proceed with the above procedures.  Potential  risks and benefits were discussed with the patient and he consented.   DESCRIPTION OF PROCEDURE:  The patient was taken to the operating room and a  general anesthetic was administered.  He was given preoperative antibiotics,  placed in the dorsal lithotomy position, prepped and draped in the usual  sterile fashion.  Next, a preoperative time-out was performed.  A Foley  catheter was then inserted into the bladder.  A site was then selected  approximately 18 cm from the pubic symphysis and just to the left the  umbilicus for  placement of the camera port.  This was placed using a  standard Hasson technique.  This allowed direct entry into the peritoneal  cavity under direct vision.  A 12-mm port was then placed and a  pneumoperitoneum was established.  A 0-degree lens was then used to inspect  the abdomen and there was no evidence of any intra-abdominal injuries or  other abnormalities.  Attention was turned to placement of the remaining  ports.  Bilateral 8-mm robotic ports were placed 16 cm from the pubic  symphysis and 10 cm lateral to the camera port.  In addition, an 8-mm port  was placed in the far left lateral abdominal wall.  A 5-mm port was placed  between the camera port and the right robotic port.  An additional 12-mm  port was placed in the far right lateral abdominal wall.  All ports were  placed under direct vision and without difficulty.  The surgical cart was  then docked.  With the aid of the hook cautery, the bladder was reflected  posteriorly, allowing entry into the space of Retzius and  identification of  the endopelvic fascia and prostate.  The endopelvic fascia was then incised  from the apex back to the base of the prostate bilaterally, allowing the  underlying levator muscle fibers to be exposed, which were then swept  laterally off the prostate.  This isolated the dorsal venous complex, which  was then divided with the 45-mm Flex ETS stapler.  The bladder neck was then  identified with the aid of Foley catheter manipulation.  The anterior  bladder neck was then entered with the hook cautery.  This allowed the Foley  catheter to be identified.  The Foley catheter balloon was then deflated and  the catheter was retracted anteriorly, which allowed the prostate to be  retracted up toward the abdominal wall.  This exposed the posterior bladder  neck very nicely.  There was noted be a small median lobe, which was able to  be brought up into the field of view.  The bladder neck was then  incised  just below the bladder neck and dissection continued posteriorly until the  vasa deferentia and seminal vesicles were identified.  The vasa deferentia  were isolated and divided.  The seminal vesicles were then isolated and  lifted anteriorly.  The space between Denonvilliers' fascia  and the  anterior rectum was then bluntly developed, thereby isolating the vascular  pedicles of the prostate.  Attention was then turned to the anterior aspect  of the prostate.  The lateral prostatic fascia was sharply incised with cold  scissors dissection, allowing the neurovascular bundles to be swept  laterally and posteriorly off the prostate.  The vascular pedicles of the  prostate were then ligated with hemoclips and sharply divided.  Attention  was then turned to the distal aspect of the prostate, where the urethra was  identified and sharply divided.  This allowed the prostate specimen to be  disarticulated and placed up into the abdomen.  The pelvis was then  copiously irrigated and hemostasis was ensured. Air was injected into the  rectal catheter with irrigation fluid in the pelvis and there was no  evidence of a rectal injury. The bladder neck was then examined and it was  felt necessary to perform bladder neck reconstruction.  This was performed  with figure-of-eight 3-0 Vicryl sutures at the 5 and 7 o'clock position of  the bladder neck.  A 2-0 Vicryl slip-knot was then placed at the 6 o'clock  position between the bladder neck and the urethra at the 6 o'clock position,  which allowed these structures to be reapproximated.  A double-armed 2-0  Monocryl suture was then used to perform a 360-degree running tension-free  anastomosis between the bladder neck and urethra.  A new 20-French coude  catheter was inserted to the bladder, which was irrigated.  There were no  blood clots within the bladder and the anastomosis appeared to be watertight.  A #19 Blake drain was then brought through  the left robotic  port and appropriately positioned and the pelvis.  It was secured to the  skin with a nylon suture.  The surgical cart was then undocked.  The  Endopouch retrieval bag was then used to retrieve the prostate specimen for  later removal via the periumbilical port.  A 0 Vicryl abdominal wall fascial  incision was then placed on either side of the 12-mm port site in the far  right lateral abdominal wall for port site closure.  All remaining ports  were then removed under direct vision.  The prostate specimen and then  removed intact within the Endopouch retrieval bag via the periumbilical port  site.  This fascial opening was then closed with a running 0 Vicryl suture.  0.25% Marcaine was then used to inject all port sites, which were  reapproximated at the skin level with staples.  Sterile dressings were  applied.  The patient appeared to tolerate the procedure well and without  complications.  All sponge and needle counts were correct x2 at the end of  the procedure.  The patient was able to be extubated and transferred to the  recovery unit in satisfactory condition.           ______________________________  Raynelle Bring, MD  Electronically Signed     LB/MEDQ  D:  09/17/2005  T:  09/18/2005  Job:  [id number]

## 2010-12-06 NOTE — Cardiovascular Report (Signed)
[place]  Patient:    [name], [name] Visit Number: [id number] MRN: [id number]          Service Type: MED Location: [phone] Attending Physician:  Epifanio Lesches Dictated by:   Troy Sine, M.D. Proc. Date: 09/23/01 Admit Date:  09/21/2001   CC:         Richard A. Rollene Fare, M.D.  Neill Loft, R.N.   Cardiac Catheterization  INDICATIONS: The patient is a 65 year old, African American gentleman, patient of Dr. Rollene Fare, with a history of hypertension and significant LVH. He was admitted the evening of September 21, 2001, with palpitations and shortness of breath and some vague chest pain. He was noted to have marked ST-T changes. Initial CK was 400 with an MB of 17. Troponin was 0.02. The patients second CPK showed a relative index of 3.5. Although the patient does have marked LVH documented by echocardiogram, and previously noted marked T wave changes, in light of his recent symptomatology with borderline CK-MB, it was felt definitive catheterization was indicated.  DESCRIPTION OF PROCEDURE: After premedication with Valium 5 mg intravenously, the patient was still significantly nervous prior to the catheterization. An additional 2 mg of Versed was then administered. The right femoral artery was punctured anteriorly and a 6 French sheath was inserted. Diagnostic catheterization was done with 6 French Judkins 4 left and right coronary catheters, as well as a 6 French pigtail which was used for left ventriculography as well as distal aortography. Hemostasis was obtained by direct manual pressure.  HEMODYNAMIC DATA: Central aortic pressure was 144/75, left ventricular pressure 144/24.  ANGIOGRAPHIC DATA: Left main coronary artery:  The left main coronary artery was a large angiographically normal vessel which trifurcated into an LAD, intermediate and left circumflex system.  Left anterior descending: The LAD was a  large vessel that wrapped around the LV apex. Two portions of the mid LAD seemed to dip intramyocardially. In the second portion there was a suggestion of mild systolic muscle bridging of approximately 20-30% after a diminutive diagonal branch. The remainder of the LAD was angiographically normal.  The intermediate vessel was angiographically normal and bifurcated in its two branches in its midportion.  The AV groove circumflex was angiographically normal and gave rise to the distal circumflex marginal vessel.  The right coronary artery was angiographically normal.  LEFT VENTRICULOGRAPHY: Left ventriculography revealed hyperdynamic LV function with an ejection fraction of at least 65%. There did appear to be concentric left ventricular hypertrophy.  DISTAL AORTOGRAM: Distal aortography revealed normal renal arteries. There was no aortoiliac disease.  IMPRESSION: 1. Hyperdynamic left ventricular function with concentric left ventricular    hypertrophy without wall motion abnormalities. 2. Essentially normal coronary arteries with evidence for mild mid systolic    muscle bridging of approximately 20% in the mid left anterior descending. 3. Normal aortoiliac system.  RECOMMENDATIONS: Medical therapy. The patient has marked ST-T changes with deeply inverted T waves. He does have a significant LVH. It is certainly possible that his marked strain affect may be due to myocardial mass, small vessel flow abnormalities. The patient will be discharged later today with followup with Dr. Rollene Fare. Dictated by:   Troy Sine, M.D. Attending Physician:  Epifanio Lesches DD:  09/23/01 TD:  09/23/01 Job: [id number]

## 2010-12-06 NOTE — H&P (Signed)
[name], [name]         ACCOUNT NO.:  [id number]   MEDICAL RECORD NO.:  [id number]          PATIENT TYPE:  INP   LOCATION:  X003                         FACILITY:  [facility]   PHYSICIAN:  Parker Bring, MD      DATE OF BIRTH:  [birth year]   DATE OF ADMISSION:  09/17/2005  DATE OF DISCHARGE:                                HISTORY & PHYSICAL   CHIEF COMPLAINT:  Clinically localized prostate cancer.   HISTORY OF PRESENT ILLNESS:  [name] is a 65 year old gentleman who  was recently diagnosed with clinical stage T1C prostate cancer with a PSA of  5.50 and a Gleason score of 3+3=6.  Preoperative AUA symptom score was 23  with an IIEF score of 21.  After discussing management options for  clinically localized adenocarcinoma of the prostate, the patient elected to  proceed with surgical therapy.   PAST MEDICAL HISTORY:  1.  Hypercholesterolemia.  2.  Hypertension.  3.  History of chest pain.   PAST SURGICAL HISTORY:  1.  Circumcision.  2.  ESWL.   MEDICATIONS:  1.  Lovastatin.  2.  Prilosec.  3.  Diltiazem XR.  4.  Benazepril.  5.  Viagra PRN.  6.  Triamterene/hydrochlorothiazide.  7.  Pindolol.  8.  Albuterol.  9.  Aspirin.   ALLERGIES:  No known drug allergies.   FAMILY HISTORY:  There is a history of coronary artery disease and  hypertension in the family.  The patient has a first cousin with prostate  cancer but no first degree relatives who have been diagnosed with prostate  cancer.   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  This does  involve heavy lifting and strenuous activity as part of his job.  He did  smoke for 20+ years but quit in 1985.  He drinks alcohol occasionally.  He  is currently married.   PHYSICAL EXAMINATION:  CONSTITUTIONAL:  The patient is a well-nourished,  well-developed, age-appropriate male in no acute distress.  CARDIOVASCULAR:  Regular rate and rhythm without obvious murmurs.  LUNGS:  Clear  bilaterally.  ABDOMEN:  Soft, nontender, nondistended without abdominal masses.  RECTAL:  No nodularity or induration.  EXTREMITIES:  No edema.   IMPRESSION:  Clinically localized adenocarcinoma of the prostate.   PLAN:  [name] has undergone a preoperative cardiac  evaluation by Dr. Rollene [name] secondary to his history of chest pain and has  been cleared for surgery.  We will plan to perform a robotic-assisted  laparoscopic radical prostatectomy and he will then be admitted to the  hospital for routine postoperative care.           ______________________________  Parker Bring, MD  Electronically Signed     LB/MEDQ  D:  09/17/2005  T:  09/17/2005  Job:  [id number]

## 2010-12-06 NOTE — Discharge Summary (Signed)
[name], [name]         ACCOUNT NO.:  [id number]   MEDICAL RECORD NO.:  [id number]          PATIENT TYPE:  INP   LOCATION:  [id number]                         [facility]ACILITY[facility]  [facility]   PHYSICIAN:  Raynelle Bring, MD      DATE OF BIRTH:  July 04, 1946   DATE OF ADMISSION:  09/17/2005  DATE OF DISCHARGE:  09/18/2005                                 DISCHARGE SUMMARY   ADMISSION DIAGNOSIS:  Clinically localized adenocarcinoma of prostate.   DISCHARGE DIAGNOSIS:  Clinically localized adenocarcinoma of prostate.   PROCEDURE:  Robotic assisted laparoscopic radical prostatectomy.   HISTORY AND PHYSICAL:  For full details please see admission history and  physical. Briefly, [name] is a 65 year old gentleman with clinical  stage T1C prostate cancer with a Gleason score of 3 + 3 equals 6, and a PSA  of 5.50. After discussing management options, the patient elected to proceed  with surgical therapy.   HOSPITAL COURSE:  On September 17, 2005, the patient underwent an  uncomplicated robotically assisted laparoscopic radical prostatectomy. He  tolerated this procedure well; and postoperatively was able to be  transferred to a regular hospital room following recovery from anesthesia.  Over the course of the next 24 hours, he was monitored, and did very well  from the hemodynamic standpoint. He was able to begin a clear liquid diet  which he tolerated well; and was subsequently transitioned to oral pain  medication on postoperative day #1.  In addition, he was able to begin  ambulating which he did without difficulty. He also was noted to have  excellent urine output with minimal pelvic drain output on postoperative day  #1; and, therefore, his drain was able to be removed. He was discharged home  in excellent condition on postoperative day #1.   DISPOSITION:  Home.   DISCHARGE INSTRUCTIONS:  The patient was instructed to refrain from any  heavy lifting or strenuous activity for a period of 6  weeks. However, he was  encouraged to be ambulatory as much as possible. He was instructed to  gradually advance his diet once he began passing flatus. He was instructed  on the signs and symptoms of wound infection; and told to call should he  have any problems. He was also instructed on routine Foley catheter care and  given a leg bag for daytime usage.   DISCHARGE MEDICATIONS:  The patient was instructed to resume all of his  regular home medications except for any aspirin, nonsteroidal anti-  inflammatory drugs, or herbal supplements for a period of 10 days. He was  given a prescription for Vicodin to take as needed for pain, Cipro to take 1  day prior to his return visit for Foley catheter removal, and Colace to take  twice daily as a stool softener.   FOLLOWUP:  [name] will follow up in approximately 1 week for Foley  catheter removal, staple removal, and to discuss his pathology in detail.           ______________________________  Raynelle Bring, MD  Electronically Signed     LB/MEDQ  D:  09/18/2005  T:  09/19/2005  Job:  [id number]   cc:   Biagio Borg, M.D. [facility]  [address] [place] [address]   Richard A. Rollene Fare, M.D.  Fax: [phone]

## 2010-12-06 NOTE — H&P (Signed)
[place]  Patient:    [name], [name] Visit Number: [id number] MRN: [id number]          Service Type: MED Location: [phone] Attending Physician:  Epifanio Lesches Dictated by:   Vassie Moment, R.N.,A.C.N.P. Admit Date:  09/21/2001                           History and Physical  DATE OF BIRTH: 1945/10/11  ADMITTING PHYSICIAN/CARDIOLOGIST: Richard A. Rollene Fare, M.D.  CHIEF COMPLAINT: Hypertension and fluttering palpitations.  HISTORY OF PRESENT ILLNESS: This is a 65 year old married black male, patient of Dr. Rollene Fare, who since yesterday afternoon has had a fluttering feeling in his chest.  It seemed to ease overnight until this morning, but came back today.  The location is in his mid to left chest.  The sensation is mild but it caused him a great deal of concern.  It did not have associated diaphoresis or nausea but he did have some shortness of breath and felt light headed.  He has had these sensations before but it did not last as long.  Generally it is only a few minutes and occurs maybe two to three times a year over the past several years.  Nothing that he did seemed to make it better or worse, and nothing seemed to bring it on.  With further questioning, he missed his dose of Dilacor and Altace yesterday morning and did not take those until later on yesterday evening, and he also did some weight training yesterday before the palpitations began.  His blood pressure upon arrival was 201/80, and his CK was elevated at 492.  His MB was elevated at 17.1 and his index was elevated at 3.5, but his troponin I was normal at 0.02.  The CK and MB readings may be due to the weight training that he was doing.  The patient will be admitted overnight for observation with rule out MI protocol.  PAST MEDICAL HISTORY: The patient has a past medical history, according to him, of:  1. Atherosclerosis.  2. Hypertension.  3. Long  known abnormal EKG at baseline, and he used to carry an EKG in his     wallet in order to show if he ever came to the hospital.  He does not have     that EKG today and his EKG does show abnormalities.  4. History of benign prostatic hypertrophy.  5. Smoking.  6. Degenerative joint disease with low back pain.  PAST SURGICAL HISTORY:  1. Surgery on both feet for ingrown toenails.  2. Circumcised as an adult.  He has no history of fractures, and denies any other surgeries.  MEDICATIONS:  1. Dilacor 240 mg one q.d.  2. Altace 10 mg one q.d.  3. Advil as needed for low back pain.  ALLERGIES: No known drug allergies.  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  He is right-handed.  He does not smoke but he does use caffeine and alcohol and occasionally marijuana.  FAMILY HISTORY: The patients mother died of sudden cardiac death at 82.  The patients father is 59 and has heart disease to the best of the patients recollection, but the patient is not close to his father.  He has one brother, who also has back pain.  REVIEW OF SYSTEMS: GENERAL: The patient denies fevers, chills, or night sweats.  His weight is stable,  and he sleeps well.  EYES: The patient wears reading glasses, but denies cataracts and glaucoma.  ENT: The patients hearing is good.  He denies rhinorrhea.  He does not have any loss of taste. He does not suffer from tinnitus.  RESPIRATORY: The patient does not cough or wheeze, and denies sputum, but he is shortness of breath with palpitations and notes that he has had some dyspnea on exertion when climbing steps.  CARDIAC: The patient denies chest pain or discomfort and only has the symptoms of palpitations which feel like fluttering, and this fluttering causes shortness of breath.  He denies orthopnea or paroxysmal nocturnal dyspnea, and he denies claudication.  GASTROINTESTINAL: The patient denies nausea, indigestion,  or constipation.  GENITOURINARY: The patient denies dysuria.  He does have some hesitation and has nocturia two to three times.  MUSCULOSKELETAL: The patient says that he has low back pain and cervical spine pain, but denies myalgias and states that his gait is steady.  NEUROLOGIC: The patient denies faintness, dizziness, or seizures.  LYMPHATIC: The patient denies adenopathy of the neck, axilla, and groin.  PSYCHIATRIC: The patient does have some situational depression but does not need treatment or medications.  He does profess to have some anxiety and worries a great deal.  He has had no decrease in appetite, and does not suffer from hallucinations.  HEMATOLOGIC: The patient denies that he bruises or bleeds easily.  ALLERGIES: The patient has seasonal allergies.  ALL OTHER SYSTEMS: All other systems are unremarkable.  PHYSICAL EXAMINATION:  VITAL SIGNS: Blood pressure 171/65, pulse 54, respirations 16, temperature 97.5 degrees.  Height 6 feet 3-1/2 inches.  Weight 235 pounds.  GENERAL: The patient is a well-developed, well-nourished black male, in no acute distress.  PSYCHIATRIC: The patient is pleasant, cooperative, and responds appropriately.  HEENT: PERRL, pupils 2 mm in diameter.  Mouth moist.  Oropharynx benign. Normocephalic, atraumatic.  NECK: Supple.  He has a midline trachea, without jugular venous distention, bruits, thyromegaly, or hepatojugular reflux.  CHEST: Clear to auscultation and percussion.  He is eupneic.  GENITOURINARY: Nontender over bladder.  He is circumcised.  CARDIAC: Regular rate and rhythm.  S1 and S2 heard.  Without murmurs, gallops, rubs, or clicks.  ADENOPATHY: The patient is without cervical adenopathy.  ABDOMEN: Without tenderness.  Some mild obesity.  Without distention. Positive bowel sounds.  SKIN: Warm and dry without jaundice, cyanosis, pallor, or rashes.  He has brisk capillary refill.  NEUROLOGIC: The patient is conscious, alert  and oriented to person, place, time, and situation.  Cranial nerves II-XII grossly intact.   MUSCULOSKELETAL: The patient moves all extremities x4.  His strength is 5/5 in his upper and lower extremities.  He is without ankle edema.  PSYCHOSOCIAL: The patient is accompanied to the hospital by his wife.  LABORATORY DATA: WBC 3.9, hemoglobin 13.7, hematocrit 40.5, platelets 173,000; monocytes are high at 12%.  Sodium 138, potassium 3.7, chloride 108, bicarbonate 27, BUN 13, creatinine 0.8.  Troponin I is 0.02.  CK is high at 492, MB is high at 17.4, index is high at 3.5.  Chest x-ray shows no acute disease.  EKG shows sinus bradycardia with first degree AV block and ST-T wave abnormalities, which the patient states is chronic.  IMPRESSION:  1. Palpitations.  2. Dyspnea.  3. Abnormal electrocardiogram.  4. Abnormal cardiac enzymes.  PLAN:  1. Admit to telemetry.  2. Serial enzymes.  3. Serial EKGs.  4. Chest x-ray.  5. Consider cardiac catheterization in the  morning.  6. Notify Dr. Rollene Fare of admission.  7. Lovenox by pharmacy protocol.  8. Admission laboratories.  9. Home medications. Dictated by:   Vassie Moment, R.N.,A.C.N.P. Attending Physician:  Epifanio Lesches DD:  09/21/01 TD:  09/22/01 Job: [id number]

## 2010-12-20 DIAGNOSIS — N281 Cyst of kidney, acquired: Secondary | ICD-10-CM

## 2010-12-20 HISTORY — DX: Cyst of kidney, acquired: N28.1

## 2010-12-31 ENCOUNTER — Encounter: Payer: Self-pay | Admitting: Internal Medicine

## 2010-12-31 DIAGNOSIS — Z0000 Encounter for general adult medical examination without abnormal findings: Secondary | ICD-10-CM | POA: Insufficient documentation

## 2011-01-02 ENCOUNTER — Encounter: Payer: Self-pay | Admitting: Internal Medicine

## 2011-01-02 ENCOUNTER — Other Ambulatory Visit (INDEPENDENT_AMBULATORY_CARE_PROVIDER_SITE_OTHER): Payer: BC Managed Care – PPO

## 2011-01-02 ENCOUNTER — Ambulatory Visit (INDEPENDENT_AMBULATORY_CARE_PROVIDER_SITE_OTHER)
Admission: RE | Admit: 2011-01-02 | Discharge: 2011-01-02 | Disposition: A | Discharge status: Home / Self Care | Payer: BC Managed Care – PPO | Source: Ambulatory Visit | Attending: Internal Medicine | Admitting: Internal Medicine

## 2011-01-02 ENCOUNTER — Ambulatory Visit (INDEPENDENT_AMBULATORY_CARE_PROVIDER_SITE_OTHER): Payer: BC Managed Care – PPO | Admitting: Internal Medicine

## 2011-01-02 VITALS — BP 134/68 | HR 101 | Temp 98.2°F | Ht 73.0 in | Wt 264.5 lb

## 2011-01-02 DIAGNOSIS — R0989 Other specified symptoms and signs involving the circulatory and respiratory systems: Secondary | ICD-10-CM

## 2011-01-02 DIAGNOSIS — I10 Essential (primary) hypertension: Secondary | ICD-10-CM

## 2011-01-02 DIAGNOSIS — M549 Dorsalgia, unspecified: Secondary | ICD-10-CM

## 2011-01-02 DIAGNOSIS — I4891 Unspecified atrial fibrillation: Secondary | ICD-10-CM

## 2011-01-02 DIAGNOSIS — R0609 Other forms of dyspnea: Secondary | ICD-10-CM | POA: Insufficient documentation

## 2011-01-02 DIAGNOSIS — IMO0002 Reserved for concepts with insufficient information to code with codable children: Secondary | ICD-10-CM

## 2011-01-02 DIAGNOSIS — I480 Paroxysmal atrial fibrillation: Secondary | ICD-10-CM | POA: Insufficient documentation

## 2011-01-02 DIAGNOSIS — I4821 Permanent atrial fibrillation: Secondary | ICD-10-CM | POA: Insufficient documentation

## 2011-01-02 DIAGNOSIS — R0600 Dyspnea, unspecified: Secondary | ICD-10-CM

## 2011-01-02 DIAGNOSIS — Z0000 Encounter for general adult medical examination without abnormal findings: Secondary | ICD-10-CM

## 2011-01-02 LAB — HEPATIC FUNCTION PANEL
ALT: 22 U/L (ref 0–53)
AST: 35 U/L (ref 0–37)
Albumin: 3.6 g/dL (ref 3.5–5.2)
Alkaline Phosphatase: 112 U/L (ref 39–117)
Bilirubin, Direct: 0.1 mg/dL (ref 0.0–0.3)
Total Bilirubin: 0.4 mg/dL (ref 0.3–1.2)
Total Protein: 8.1 g/dL (ref 6.0–8.3)

## 2011-01-02 LAB — URINALYSIS, ROUTINE W REFLEX MICROSCOPIC
Bilirubin Urine: NEGATIVE
Hgb urine dipstick: NEGATIVE
Ketones, ur: NEGATIVE
Leukocytes, UA: NEGATIVE
Nitrite: NEGATIVE
Specific Gravity, Urine: 1.02 (ref 1.000–1.030)
Total Protein, Urine: NEGATIVE
Urine Glucose: NEGATIVE
Urobilinogen, UA: 0.2 (ref 0.0–1.0)
pH: 6 (ref 5.0–8.0)

## 2011-01-02 LAB — BASIC METABOLIC PANEL
BUN: 17 mg/dL (ref 6–23)
CO2: 27 mEq/L (ref 19–32)
Calcium: 8.8 mg/dL (ref 8.4–10.5)
Chloride: 109 mEq/L (ref 96–112)
Creatinine, Ser: 1.2 mg/dL (ref 0.4–1.5)
GFR: 78.38 mL/min (ref >60.00)
Glucose, Bld: 106 mg/dL — ABNORMAL HIGH (ref 70–99)
Potassium: 4.2 mEq/L (ref 3.5–5.1)
Sodium: 140 mEq/L (ref 135–145)

## 2011-01-02 LAB — CBC WITH DIFFERENTIAL/PLATELET
Basophils Absolute: 0 10*3/uL (ref 0.0–0.1)
Basophils Relative: 0.8 % (ref 0.0–3.0)
Eosinophils Absolute: 0.2 10*3/uL (ref 0.0–0.7)
Eosinophils Relative: 2.9 % (ref 0.0–5.0)
HCT: 35.8 % — ABNORMAL LOW (ref 39.0–52.0)
Hemoglobin: 12.1 g/dL — ABNORMAL LOW (ref 13.0–17.0)
Lymphocytes Relative: 37.7 % (ref 12.0–46.0)
Lymphs Abs: 2 10*3/uL (ref 0.7–4.0)
MCHC: 33.9 g/dL (ref 30.0–36.0)
MCV: 86.1 fl (ref 78.0–100.0)
Monocytes Absolute: 0.6 10*3/uL (ref 0.1–1.0)
Monocytes Relative: 11.7 % (ref 3.0–12.0)
Neutro Abs: 2.5 10*3/uL (ref 1.4–7.7)
Neutrophils Relative %: 46.9 % (ref 43.0–77.0)
Platelets: 215 10*3/uL (ref 150.0–400.0)
RBC: 4.16 Mil/uL — ABNORMAL LOW (ref 4.22–5.81)
RDW: 15.2 % — ABNORMAL HIGH (ref 11.5–14.6)
WBC: 5.2 10*3/uL (ref 4.5–10.5)

## 2011-01-02 LAB — SEDIMENTATION RATE: Sed Rate: 49 mm/hr — ABNORMAL HIGH (ref 0–22)

## 2011-01-02 MED ORDER — PREDNISONE 10 MG PO TABS: 10.0000 mg | ORAL_TABLET | Freq: Every day | ORAL | Status: AC

## 2011-01-02 MED ORDER — LEVOFLOXACIN 500 MG PO TABS: 500.0000 mg | ORAL_TABLET | Freq: Every day | ORAL | Status: AC

## 2011-01-02 MED ORDER — FUROSEMIDE 40 MG PO TABS: 40.0000 mg | ORAL_TABLET | Freq: Every day | ORAL | Status: DC

## 2011-01-02 MED ORDER — CYCLOBENZAPRINE HCL 5 MG PO TABS: 5.0000 mg | ORAL_TABLET | Freq: Three times a day (TID) | ORAL | Status: DC | PRN

## 2011-01-02 NOTE — Progress Notes (Signed)
Subjective:    Patient ID: [name], male    DOB: 12-17-1945, 65 y.o.   MRN: [id number]  HPI  Here to f/u - concerned about several issues;  C/o lower stamina and sob/doe ongoing, Worse in the last 2-3 wks with being able to ambulate only 10-20 paces, has to sit and catchhis breath climbing up on steps or ladders at work;  Pt denies fever, wt loss, night sweats, loss of appetite, or other constitutional symptoms  No  HA, ST;  Had some mild cough intemittent nonprod,  And Pt denies chest pain, wheezing, orthopnea, PND, increased LE swelling, palpitations, dizziness or syncope. Gets some breathless to talk, and bend over to tie the shoes.  Pt denies new neurological symptoms such as new headache, or facial or extremity weakness or numbness   Pt denies polydipsia, polyuria.  Was out of work and sitting more , less active after 4 mo out of work to get knee surgury, but sob/doe out of proportion to him than deconditioning.  Gained 9 lbs since July 2011.  Past Medical History  Diagnosis Date  . COPD (chronic obstructive pulmonary disease)   . Hyperlipidemia   . Anxiety   . Diabetes mellitus type II     diet  . HTN (hypertension)   . LBP (low back pain)   . BPH (benign prostatic hyperplasia)   . Paresthesias     lower extremity  . Diverticulitis     colon  . History of prostate cancer   . Lumbar disc disease   . ETOHism     remission  . Nephrolithiasis   . ED (erectile dysfunction)   . UTI (lower urinary tract infection)   . Hx of colonic polyps   . Adenoma 05/2008  . Atrial fibrillation or flutter 01/02/2011   Past Surgical History  Procedure Date  . Prostatectomy     reports that he has quit smoking. He does not have any smokeless tobacco history on file. He reports that he drinks alcohol. His drug history not on file. family history includes Coronary artery disease in his father and mother; Hypertension in an unspecified family member; and Prostate cancer in his father. No  Known Allergies Current Outpatient Prescriptions on File Prior to Visit  Medication Sig Dispense Refill  . acetaminophen (TYLENOL) 500 MG tablet Take 500 mg by mouth 3 (three) times daily.        Marland Kitchen albuterol (PROVENTIL HFA) 108 (90 BASE) MCG/ACT inhaler Inhale 2 puffs into the lungs 4 (four) times daily as needed.        Marland Kitchen aspirin 81 MG EC tablet Take 81 mg by mouth daily.        . benazepril (LOTENSIN) 40 MG tablet Take 40 mg by mouth 2 (two) times daily.        Marland Kitchen diltiazem (CARDIZEM LA) 180 MG 24 hr tablet Take 180 mg by mouth 2 (two) times daily.        Marland Kitchen HYDROcodone-acetaminophen (NORCO) 5-325 MG per tablet Take 1-2 tablets by mouth 2 (two) times daily as needed.        Marland Kitchen ibuprofen (ADVIL,MOTRIN) 200 MG tablet Take 200 mg by mouth as directed.        . lactase (LACTAID) 3000 UNITS tablet Take 1 tablet by mouth 3 (three) times daily as needed.        Marland Kitchen omeprazole (PRILOSEC) 20 MG capsule Take 20 mg by mouth daily as needed.        . pindolol (  VISKEN) 5 MG tablet Take 2.5 mg by mouth 2 (two) times daily.        . pravastatin (PRAVACHOL) 40 MG tablet Take 40 mg by mouth daily.        Marland Kitchen tiotropium (SPIRIVA) 18 MCG inhalation capsule Place 18 mcg into inhaler and inhale daily. One puff daily.       Marland Kitchen DISCONTD: Acetaminophen (CHLORASEPTIC SORE THROAT) 167 MG/5ML LIQD Take by mouth as directed.        Marland Kitchen DISCONTD: hydrochlorothiazide 25 MG tablet Take 25 mg by mouth as directed. Take 1 tablet PO QAM for 5 days per week.        Review of Systems Review of Systems  Constitutional: Negative for diaphoresis and unexpected weight change.  HENT: Negative for drooling and tinnitus.   Eyes: Negative for photophobia and visual disturbance.  Respiratory: Negative for choking and stridor.   Gastrointestinal: Negative for vomiting and blood in stool.  Genitourinary: Negative for hematuria and decreased urine volume.  Musculoskeletal: Negative for gait problem.  Skin: Negative for color change and wound.    Neurological: Negative for tremors and numbness.  Psychiatric/Behavioral: Negative for decreased concentration. The patient is not hyperactive.       Objective:   Physical Exam BP 134/68  Pulse 101  Temp(Src) 98.2 F (36.8 C) (Oral)  Ht 6\' 1"  (1.854 m)  Wt 264 lb 8 oz (119.976 kg)  BMI 34.90 kg/m2  SpO2 97% Physical Exam  VS noted Constitutional: Pt is oriented to person, place, and time. Appears well-developed and well-nourished.  HENT:  Head: Normocephalic and atraumatic.  Right Ear: External ear normal.  Left Ear: External ear normal.  Nose: Nose normal.  Mouth/Throat: Oropharynx is clear and moist.  Eyes: Conjunctivae and EOM are normal. Pupils are equal, round, and reactive to light.  Neck: Normal range of motion. Neck supple. No JVD present. No tracheal deviation present.  Cardiovascular: Normal rate, irregular rhythm, normal heart sounds and intact distal pulses.   Pulmonary/Chest: Effort normal and breath sounds normal. except few LLL rales Abdominal: Soft. Bowel sounds are normal. There is no tenderness.  Musculoskeletal: Normal range of motion. Exhibits no edema.  Lymphadenopathy:  Has no cervical adenopathy.  Neurological: Pt is alert and oriented to person, place, and time. Pt has normal reflexes. No cranial nerve deficit. LE motor/dtr intact Skin: Skin is warm and dry. No rash noted.  Psychiatric:  Has  normal mood and affect. Behavior is normal. excetp 1+ nervous        Assessment & Plan:

## 2011-01-02 NOTE — Assessment & Plan Note (Addendum)
2-3 wks, grad onset,, unclear etiology, exam has few LLL rales; will consider tx with empiric levaquin, prednisone, to cont inhalers, check ecg, cxr, routine labs, and f/u mon June 18;  If cxr more c/w volume overload will need to consider change hctz to lasix and f/u with Dr Frutoso Chase

## 2011-01-02 NOTE — Patient Instructions (Addendum)
Stop the hydrochlorothiazide Start the furosemide 40 mg per day (start tonight) The antibiotic was sent to the pharmacy, but hold off on taking it until we call you in the AM, unless you have fever  You are given the prednisone prescription, but hold off on taking this as well, until notified in the AM You can take the generic flexeril for the left back area as needed for muscle spasm now Please go to LAB in the Basement for the blood and/or urine tests to be done today Please call the phone number [phone] (the [place]) for results of testing in 2-3 days;  When calling, simply dial the number, and when prompted enter the MRN number above (the Medical Record Number) and the # key, then the message should start. Please return in 3 mo with Lab testing done 3-5 days before (unless we need to see you sooner based on your lab results)

## 2011-01-02 NOTE — Assessment & Plan Note (Signed)
Mild left flank area c/w MSK strain, for flexeril prn,  to f/u any worsening symptoms or concerns

## 2011-01-02 NOTE — Assessment & Plan Note (Addendum)
ecg reviewed - afib/flutter , rate 84 , New onset, cont asa for now, I think prob assoc with pulm venous htn/ possible pulm edema;  To stop the hctz, start lasix 40 mg, and refer to Dr Rollene Fare asap;  May be able to consider coumadin x 3 wks and conversion I should think after echo

## 2011-01-02 NOTE — Assessment & Plan Note (Signed)
stable overall by hx and exam, most recent data reviewed with pt, and pt to continue medical treatment as before  BP Readings from Last 3 Encounters:  01/02/11 134/68  01/31/10 132/64  08/27/09 140/70

## 2011-01-03 ENCOUNTER — Telehealth: Payer: Self-pay | Admitting: Internal Medicine

## 2011-01-03 DIAGNOSIS — R911 Solitary pulmonary nodule: Secondary | ICD-10-CM | POA: Insufficient documentation

## 2011-01-03 LAB — D-DIMER, QUANTITATIVE: D-Dimer, Quant: 2.97 ug/mL-FEU — ABNORMAL HIGH (ref 0.00–0.48)

## 2011-01-03 NOTE — Assessment & Plan Note (Signed)
cxr with ? Right nodule- for chest ct - no cm

## 2011-01-03 NOTE — Telephone Encounter (Signed)
?   Right nodule per cxr -

## 2011-01-10 ENCOUNTER — Ambulatory Visit (INDEPENDENT_AMBULATORY_CARE_PROVIDER_SITE_OTHER)
Admission: RE | Admit: 2011-01-10 | Discharge: 2011-01-10 | Disposition: A | Discharge status: Home / Self Care | Payer: BC Managed Care – PPO | Source: Ambulatory Visit | Attending: Internal Medicine | Admitting: Internal Medicine

## 2011-01-10 DIAGNOSIS — R911 Solitary pulmonary nodule: Secondary | ICD-10-CM

## 2011-01-10 DIAGNOSIS — J984 Other disorders of lung: Secondary | ICD-10-CM

## 2011-01-13 ENCOUNTER — Other Ambulatory Visit: Payer: Self-pay | Admitting: Internal Medicine

## 2011-01-13 ENCOUNTER — Encounter: Payer: Self-pay | Admitting: Internal Medicine

## 2011-01-13 DIAGNOSIS — N281 Cyst of kidney, acquired: Secondary | ICD-10-CM

## 2011-01-16 ENCOUNTER — Encounter: Payer: Self-pay | Admitting: Internal Medicine

## 2011-01-27 ENCOUNTER — Other Ambulatory Visit: Payer: Self-pay | Admitting: Internal Medicine

## 2011-01-27 ENCOUNTER — Telehealth: Payer: Self-pay | Admitting: *Deleted

## 2011-01-27 ENCOUNTER — Ambulatory Visit (HOSPITAL_COMMUNITY)
Admission: RE | Admit: 2011-01-27 | Discharge: 2011-01-27 | Disposition: A | Discharge status: Home / Self Care | Payer: BC Managed Care – PPO | Source: Ambulatory Visit | Attending: Internal Medicine | Admitting: Internal Medicine

## 2011-01-27 DIAGNOSIS — N281 Cyst of kidney, acquired: Secondary | ICD-10-CM

## 2011-01-27 DIAGNOSIS — Q619 Cystic kidney disease, unspecified: Secondary | ICD-10-CM | POA: Insufficient documentation

## 2011-01-27 DIAGNOSIS — K449 Diaphragmatic hernia without obstruction or gangrene: Secondary | ICD-10-CM | POA: Insufficient documentation

## 2011-01-27 DIAGNOSIS — M4120 Other idiopathic scoliosis, site unspecified: Secondary | ICD-10-CM | POA: Insufficient documentation

## 2011-01-27 MED ORDER — GADOBENATE DIMEGLUMINE 529 MG/ML IV SOLN
20.0000 mL | Freq: Once | INTRAVENOUS | Status: AC
  Administered 2011-01-27: 20 mL via INTRAVENOUS

## 2011-01-27 NOTE — Telephone Encounter (Signed)
Requesting paper order be sent for MRI of abdomen w and w/o contrast for renal cyst-faxed to [phone]

## 2011-01-27 NOTE — Telephone Encounter (Signed)
[facility] called regarding order placed for MRI-order needs to be changed to MRI of the abdomen w and w/o contrast-pt is scheduled for this morning at 11:00am

## 2011-01-27 NOTE — Telephone Encounter (Signed)
To pcc's to help with this

## 2011-01-27 NOTE — Telephone Encounter (Signed)
Order re-done per emr

## 2011-03-01 ENCOUNTER — Other Ambulatory Visit: Payer: Self-pay | Admitting: Internal Medicine

## 2011-03-10 ENCOUNTER — Other Ambulatory Visit: Payer: Self-pay

## 2011-03-10 MED ORDER — HYDROCODONE-ACETAMINOPHEN 5-325 MG PO TABS: 1.0000 | ORAL_TABLET | Freq: Two times a day (BID) | ORAL | Status: DC | PRN

## 2011-03-10 NOTE — Telephone Encounter (Signed)
Faxed hardcopy to [place] [phone]

## 2011-03-29 ENCOUNTER — Other Ambulatory Visit: Payer: Self-pay | Admitting: Internal Medicine

## 2011-03-31 ENCOUNTER — Other Ambulatory Visit: Payer: Self-pay | Admitting: Internal Medicine

## 2011-04-17 LAB — URINALYSIS, ROUTINE W REFLEX MICROSCOPIC
Bilirubin Urine: NEGATIVE
Glucose, UA: NEGATIVE
Hgb urine dipstick: NEGATIVE
Ketones, ur: NEGATIVE
Nitrite: NEGATIVE
Protein, ur: NEGATIVE
Specific Gravity, Urine: 1.01
Urobilinogen, UA: 1
pH: 6

## 2011-04-17 LAB — POCT I-STAT, CHEM 8
BUN: 19
Calcium, Ion: 1.1 — ABNORMAL LOW
Chloride: 106
Creatinine, Ser: 1.6 — ABNORMAL HIGH
Glucose, Bld: 118 — ABNORMAL HIGH
HCT: 38 — ABNORMAL LOW
Hemoglobin: 12.9 — ABNORMAL LOW
Potassium: 4
Sodium: 137
TCO2: 21

## 2011-04-17 LAB — BASIC METABOLIC PANEL
BUN: 14
CO2: 23
Calcium: 9.2
Chloride: 105
Creatinine, Ser: 1.22
GFR calc Af Amer: >60
GFR calc non Af Amer: >60
Glucose, Bld: 142 — ABNORMAL HIGH
Potassium: 3.8
Sodium: 135

## 2011-04-17 LAB — DIFFERENTIAL
Basophils Absolute: 0
Basophils Relative: 0
Eosinophils Absolute: 0.1
Eosinophils Relative: 1
Lymphocytes Relative: 22
Lymphs Abs: 1.2
Monocytes Absolute: 1.1 — ABNORMAL HIGH
Monocytes Relative: 19 — ABNORMAL HIGH
Neutro Abs: 3.1
Neutrophils Relative %: 57

## 2011-04-17 LAB — URINE MICROSCOPIC-ADD ON

## 2011-04-17 LAB — CBC
HCT: 36.3 — ABNORMAL LOW
Hemoglobin: 11.9 — ABNORMAL LOW
MCHC: 32.9
MCV: 86.6
Platelets: 155
RBC: 4.19 — ABNORMAL LOW
RDW: 13.9
WBC: 5.5

## 2011-05-08 ENCOUNTER — Other Ambulatory Visit: Payer: Self-pay | Admitting: Internal Medicine

## 2011-05-26 ENCOUNTER — Other Ambulatory Visit: Payer: Self-pay | Admitting: Internal Medicine

## 2011-05-30 ENCOUNTER — Ambulatory Visit: Payer: BC Managed Care – PPO

## 2011-06-03 ENCOUNTER — Ambulatory Visit (INDEPENDENT_AMBULATORY_CARE_PROVIDER_SITE_OTHER): Payer: BC Managed Care – PPO

## 2011-06-03 DIAGNOSIS — Z23 Encounter for immunization: Secondary | ICD-10-CM

## 2011-07-03 ENCOUNTER — Other Ambulatory Visit: Payer: Self-pay | Admitting: Orthopaedic Surgery

## 2011-07-03 DIAGNOSIS — M479 Spondylosis, unspecified: Secondary | ICD-10-CM

## 2011-07-11 ENCOUNTER — Ambulatory Visit
Admission: RE | Admit: 2011-07-11 | Discharge: 2011-07-11 | Disposition: A | Discharge status: Home / Self Care | Payer: BC Managed Care – PPO | Source: Ambulatory Visit | Attending: Orthopaedic Surgery | Admitting: Orthopaedic Surgery

## 2011-07-11 DIAGNOSIS — M479 Spondylosis, unspecified: Secondary | ICD-10-CM

## 2011-11-28 ENCOUNTER — Other Ambulatory Visit: Payer: Self-pay | Admitting: Urology

## 2012-01-05 ENCOUNTER — Encounter (HOSPITAL_BASED_OUTPATIENT_CLINIC_OR_DEPARTMENT_OTHER): Admission: RE | Payer: Self-pay | Source: Ambulatory Visit

## 2012-01-05 ENCOUNTER — Ambulatory Visit (HOSPITAL_BASED_OUTPATIENT_CLINIC_OR_DEPARTMENT_OTHER): Admission: RE | Admit: 2012-01-05 | Payer: BC Managed Care – PPO | Source: Ambulatory Visit | Admitting: Urology

## 2012-01-05 SURGERY — REMOVAL, PENILE PROSTHESIS
Anesthesia: General

## 2012-03-26 ENCOUNTER — Other Ambulatory Visit: Payer: Self-pay | Admitting: Internal Medicine

## 2012-04-06 DIAGNOSIS — I359 Nonrheumatic aortic valve disorder, unspecified: Secondary | ICD-10-CM | POA: Diagnosis not present

## 2012-04-06 DIAGNOSIS — R5383 Other fatigue: Secondary | ICD-10-CM | POA: Diagnosis not present

## 2012-04-06 DIAGNOSIS — R5381 Other malaise: Secondary | ICD-10-CM | POA: Diagnosis not present

## 2012-04-06 DIAGNOSIS — I10 Essential (primary) hypertension: Secondary | ICD-10-CM | POA: Diagnosis not present

## 2012-04-06 DIAGNOSIS — R6889 Other general symptoms and signs: Secondary | ICD-10-CM | POA: Diagnosis not present

## 2012-04-06 DIAGNOSIS — I4891 Unspecified atrial fibrillation: Secondary | ICD-10-CM | POA: Diagnosis not present

## 2012-04-06 DIAGNOSIS — I872 Venous insufficiency (chronic) (peripheral): Secondary | ICD-10-CM | POA: Diagnosis not present

## 2012-04-13 ENCOUNTER — Other Ambulatory Visit: Payer: Self-pay | Admitting: Internal Medicine

## 2012-04-15 ENCOUNTER — Ambulatory Visit (INDEPENDENT_AMBULATORY_CARE_PROVIDER_SITE_OTHER): Payer: Medicare Other | Admitting: Internal Medicine

## 2012-04-15 ENCOUNTER — Encounter: Payer: Self-pay | Admitting: Internal Medicine

## 2012-04-15 VITALS — BP 140/72 | HR 62 | Temp 98.6°F | Ht 73.0 in | Wt 281.0 lb

## 2012-04-15 DIAGNOSIS — J449 Chronic obstructive pulmonary disease, unspecified: Secondary | ICD-10-CM

## 2012-04-15 DIAGNOSIS — L723 Sebaceous cyst: Secondary | ICD-10-CM

## 2012-04-15 DIAGNOSIS — I10 Essential (primary) hypertension: Secondary | ICD-10-CM | POA: Diagnosis not present

## 2012-04-15 DIAGNOSIS — Z23 Encounter for immunization: Secondary | ICD-10-CM | POA: Diagnosis not present

## 2012-04-15 DIAGNOSIS — E119 Type 2 diabetes mellitus without complications: Secondary | ICD-10-CM

## 2012-04-15 NOTE — Progress Notes (Signed)
Subjective:    Patient ID: [name], male    DOB: [birth year], 66 y.o.   MRN: [id number]  HPI  Here with concern about a swelling area to the right upper back, that he noted with a small amount of drainage initially, then had daughter express more material that seemed darkish last night , cont;d to drain somewhat last night so came in today, worried about cancer or other severe illness.  No fever, red, swelling or tenderness, red streaks, BRB or pus.  No prior hx of problem similar in past, or hx of MRSA. Pt denies chest pain, increased sob or doe, wheezing, orthopnea, PND, increased LE swelling, palpitations, dizziness or syncope.  Pt denies new neurological symptoms such as new headache, or facial or extremity weakness or numbness    Pt denies polydipsia, polyuria, or low sugar symptoms such as weakness or confusion improved with po intake.  Pt states overall good compliance with meds, trying to follow lower cholesterol, diabetic diet, wt overall stable.  Had recent labs with cardiology, and urology and declines further eval today such as a1c  Past Medical History  Diagnosis Date  . COPD (chronic obstructive pulmonary disease)   . Hyperlipidemia   . Anxiety   . Diabetes mellitus type II     diet  . HTN (hypertension)   . LBP (low back pain)   . BPH (benign prostatic hyperplasia)   . Paresthesias     lower extremity  . Diverticulitis     colon  . History of prostate cancer   . Lumbar disc disease   . ETOHism     remission  . Nephrolithiasis   . ED (erectile dysfunction)   . UTI (lower urinary tract infection)   . Hx of colonic polyps   . Adenoma 05/2008  . Atrial fibrillation or flutter 01/02/2011  . Complex renal cyst 01/13/2011   Past Surgical History  Procedure Date  . Prostatectomy     reports that he has quit smoking. He does not have any smokeless tobacco history on file. He reports that he drinks alcohol. His drug history not on file. family history includes  Coronary artery disease in his father and mother; Hypertension in an unspecified family member; and Prostate cancer in his father. No Known Allergies Current Outpatient Prescriptions on File Prior to Visit  Medication Sig Dispense Refill  . amiodarone (PACERONE) 200 MG tablet Take 200 mg by mouth daily.      Marland Kitchen amLODipine (NORVASC) 5 MG tablet Take 5 mg by mouth daily.      Marland Kitchen aspirin 81 MG EC tablet Take 81 mg by mouth daily.        . benazepril (LOTENSIN) 40 MG tablet TAKE 1 TABLET BY MOUTH TWICE A DAY  180 tablet  3  . diltiazem (CARDIZEM LA) 180 MG 24 hr tablet Take 180 mg by mouth 2 (two) times daily.        Marland Kitchen ibuprofen (ADVIL,MOTRIN) 200 MG tablet Take 200 mg by mouth as directed.        . lactase (LACTAID) 3000 UNITS tablet Take 1 tablet by mouth 3 (three) times daily as needed.        Marland Kitchen omeprazole (PRILOSEC) 20 MG capsule TAKE ONE CAPSULE BY MOUTH EVERY DAY AS NEEDED  30 capsule  0  . pindolol (VISKEN) 5 MG tablet Take 2.5 mg by mouth 2 (two) times daily.        . potassium chloride (KLOR-CON) 20 MEQ packet Take 20 mEq  by mouth daily.      . pravastatin (PRAVACHOL) 40 MG tablet Take 40 mg by mouth daily.        Marland Kitchen PROVENTIL HFA 108 (90 BASE) MCG/ACT inhaler INHALE 2 PUFFS EVERY TWELVE HOURS  1 Inhaler  8  . SPIRIVA HANDIHALER 18 MCG inhalation capsule INHALE THE CONTENTS OF 1 CAPSULE EVERY DAY  90 each  3  . acetaminophen (TYLENOL) 500 MG tablet Take 500 mg by mouth 3 (three) times daily.        . cyclobenzaprine (FLEXERIL) 5 MG tablet TAKE 1 TABLET (5 MG TOTAL) BY MOUTH 3 (THREE) TIMES DAILY AS NEEDED FOR MUSCLE SPASMS.  60 tablet  0  . furosemide (LASIX) 40 MG tablet Take 1 tablet (40 mg total) by mouth daily.  30 tablet  11  . HYDROcodone-acetaminophen (NORCO) 5-325 MG per tablet Take 1-2 tablets by mouth 2 (two) times daily as needed.  60 tablet  2   Review of Systems  Constitutional: Negative for diaphoresis and unexpected weight change.  HENT: Negative for tinnitus.   Eyes:  Negative for photophobia and visual disturbance.  Respiratory: Negative for choking and stridor.   Gastrointestinal: Negative for vomiting and blood in stool.  Genitourinary: Negative for hematuria and decreased urine volume.  Musculoskeletal: Negative for gait problem.  Skin: Negative for color change and wound.  Neurological: Negative for tremors and numbness.  Psychiatric/Behavioral: Negative for decreased concentration. The patient is not hyperactive.       Objective:   Physical Exam Physical Exam  VS noted Constitutional: Pt appears well-developed and well-nourished.  HENT: Head: Normocephalic.  Right Ear: External ear normal.  Left Ear: External ear normal.  Eyes: Conjunctivae and EOM are normal. Pupils are equal, round, and reactive to light.  Neck: Normal range of motion. Neck supple.  Cardiovascular: Normal rate and regular rhythm.   Pulmonary/Chest: Effort normal and breath sounds .  Neurological: Pt is alert. Not confused  Skin: Skin is warm Right upper back with approx 1.5 cm cystic swelling area, able to express some cheezy/dark material, no red/tender/swelling,pus or fluctuance Psychiatric: Pt behavior is normal. Thought content normal.     Assessment & Plan:

## 2012-04-15 NOTE — Assessment & Plan Note (Signed)
stable overall by hx and exam, most recent data reviewed with pt, and pt to continue medical treatment as before SpO2 Readings from Last 3 Encounters:  04/15/12 95%  01/02/11 97%  01/31/10 98%

## 2012-04-15 NOTE — Patient Instructions (Addendum)
You had a sebaceous cyst of the back, which does not appear infected The best treatment is to allow it heal, and then just leave alone even though the swelling will seem to come back. If you have increased redness, swelling or tenderness or pus, this means infection and you should return for further evaluation You had the flu shot today Please keep your appointments with your specialists as you have planned - cardiology/Dr Rollene Fare, and urology

## 2012-04-15 NOTE — Assessment & Plan Note (Signed)
stable overall by hx and exam, most recent data reviewed with pt, and pt to continue medical treatment as before BP Readings from Last 3 Encounters:  04/15/12 140/72  01/02/11 134/68  01/31/10 132/64

## 2012-04-15 NOTE — Assessment & Plan Note (Signed)
Benign lesion, non inflamed, no need for specific tx at this time, though I would refer to gen surgury if he wishes now, or if lesion increases in size or continues to drain; pt reassured and will consider options for now

## 2012-04-15 NOTE — Assessment & Plan Note (Signed)
stable overall by hx and exam, most recent data reviewed with pt, and pt to continue medical treatment as before Lab Results  Component Value Date   HGBA1C 6.7* 08/27/2009

## 2012-05-05 DIAGNOSIS — M7989 Other specified soft tissue disorders: Secondary | ICD-10-CM | POA: Diagnosis not present

## 2012-05-05 DIAGNOSIS — I495 Sick sinus syndrome: Secondary | ICD-10-CM | POA: Diagnosis not present

## 2012-05-05 DIAGNOSIS — R011 Cardiac murmur, unspecified: Secondary | ICD-10-CM | POA: Diagnosis not present

## 2012-05-08 ENCOUNTER — Other Ambulatory Visit: Payer: Self-pay | Admitting: Internal Medicine

## 2012-05-20 ENCOUNTER — Other Ambulatory Visit: Payer: Self-pay | Admitting: Urology

## 2012-06-10 ENCOUNTER — Encounter (HOSPITAL_BASED_OUTPATIENT_CLINIC_OR_DEPARTMENT_OTHER): Payer: Self-pay | Admitting: *Deleted

## 2012-06-10 NOTE — Progress Notes (Addendum)
To [facility]  at 0700  Istat-chem-8,Ekg on arrival-Npo after Mn.To take amlodipine,pendolol,amiodarone,omeprazole am of procedure w/ small amt water.also bring proventil inhaler  PT CALLED BACK , VERIFIED MEDS TO TAKE AM OF SURG.

## 2012-06-16 ENCOUNTER — Encounter: Payer: Self-pay | Admitting: Gastroenterology

## 2012-06-21 ENCOUNTER — Ambulatory Visit (HOSPITAL_BASED_OUTPATIENT_CLINIC_OR_DEPARTMENT_OTHER)
Admission: RE | Admit: 2012-06-21 | Discharge: 2012-06-21 | Disposition: A | Discharge status: Home / Self Care | Payer: Medicare Other | Source: Ambulatory Visit | Attending: Urology | Admitting: Urology

## 2012-06-21 ENCOUNTER — Encounter (HOSPITAL_BASED_OUTPATIENT_CLINIC_OR_DEPARTMENT_OTHER): Payer: Self-pay | Admitting: Anesthesiology

## 2012-06-21 ENCOUNTER — Ambulatory Visit (HOSPITAL_BASED_OUTPATIENT_CLINIC_OR_DEPARTMENT_OTHER): Payer: Medicare Other | Admitting: Anesthesiology

## 2012-06-21 ENCOUNTER — Encounter (HOSPITAL_BASED_OUTPATIENT_CLINIC_OR_DEPARTMENT_OTHER): Payer: Self-pay

## 2012-06-21 ENCOUNTER — Encounter (HOSPITAL_BASED_OUTPATIENT_CLINIC_OR_DEPARTMENT_OTHER)
Admission: RE | Disposition: A | Discharge status: Home / Self Care | Payer: Self-pay | Source: Ambulatory Visit | Attending: Urology

## 2012-06-21 DIAGNOSIS — F528 Other sexual dysfunction not due to a substance or known physiological condition: Secondary | ICD-10-CM

## 2012-06-21 DIAGNOSIS — Z8546 Personal history of malignant neoplasm of prostate: Secondary | ICD-10-CM | POA: Insufficient documentation

## 2012-06-21 DIAGNOSIS — Z79899 Other long term (current) drug therapy: Secondary | ICD-10-CM | POA: Insufficient documentation

## 2012-06-21 DIAGNOSIS — R350 Frequency of micturition: Secondary | ICD-10-CM | POA: Insufficient documentation

## 2012-06-21 DIAGNOSIS — Z9079 Acquired absence of other genital organ(s): Secondary | ICD-10-CM | POA: Insufficient documentation

## 2012-06-21 DIAGNOSIS — T8389XA Other specified complication of genitourinary prosthetic devices, implants and grafts, initial encounter: Secondary | ICD-10-CM | POA: Insufficient documentation

## 2012-06-21 DIAGNOSIS — N529 Male erectile dysfunction, unspecified: Secondary | ICD-10-CM | POA: Insufficient documentation

## 2012-06-21 DIAGNOSIS — I10 Essential (primary) hypertension: Secondary | ICD-10-CM | POA: Diagnosis not present

## 2012-06-21 DIAGNOSIS — I2510 Atherosclerotic heart disease of native coronary artery without angina pectoris: Secondary | ICD-10-CM | POA: Insufficient documentation

## 2012-06-21 DIAGNOSIS — Y831 Surgical operation with implant of artificial internal device as the cause of abnormal reaction of the patient, or of later complication, without mention of misadventure at the time of the procedure: Secondary | ICD-10-CM | POA: Insufficient documentation

## 2012-06-21 DIAGNOSIS — Z7982 Long term (current) use of aspirin: Secondary | ICD-10-CM | POA: Diagnosis not present

## 2012-06-21 HISTORY — PX: PENILE PROSTHESIS IMPLANT: SHX240

## 2012-06-21 LAB — POCT I-STAT, CHEM 8
BUN: 21 mg/dL (ref 6–23)
Calcium, Ion: 1.23 mmol/L (ref 1.13–1.30)
Chloride: 107 mEq/L (ref 96–112)
Creatinine, Ser: 1.3 mg/dL (ref 0.50–1.35)
Glucose, Bld: 106 mg/dL — ABNORMAL HIGH (ref 70–99)
HCT: 43 % (ref 39.0–52.0)
Hemoglobin: 14.6 g/dL (ref 13.0–17.0)
Potassium: 4.2 mEq/L (ref 3.5–5.1)
Sodium: 143 mEq/L (ref 135–145)
TCO2: 24 mmol/L (ref 0–100)

## 2012-06-21 LAB — GLUCOSE, CAPILLARY: Glucose-Capillary: 99 mg/dL (ref 70–99)

## 2012-06-21 SURGERY — INSERTION, PENILE PROSTHESIS, INFLATABLE
Anesthesia: General | Site: Penis | Wound class: Clean

## 2012-06-21 MED ORDER — LIDOCAINE HCL (CARDIAC) 20 MG/ML IV SOLN
INTRAVENOUS | Status: DC | PRN
  Administered 2012-06-21: 80 mg via INTRAVENOUS

## 2012-06-21 MED ORDER — GENTAMICIN SULFATE 40 MG/ML IJ SOLN
490.0000 mg | INTRAMUSCULAR | Status: AC
  Administered 2012-06-21: 490 mg via INTRAVENOUS
  Filled 2012-06-21 (×2): qty 12.25

## 2012-06-21 MED ORDER — CIPROFLOXACIN HCL 500 MG PO TABS: 500.0000 mg | ORAL_TABLET | Freq: Two times a day (BID) | ORAL | Status: DC

## 2012-06-21 MED ORDER — OXYCODONE HCL 5 MG PO TABS
10.0000 mg | ORAL_TABLET | Freq: Once | ORAL | Status: AC
  Administered 2012-06-21: 10 mg via ORAL
  Filled 2012-06-21: qty 2

## 2012-06-21 MED ORDER — SODIUM CHLORIDE 0.9 % IR SOLN
Status: DC | PRN
  Administered 2012-06-21: 1000 mL

## 2012-06-21 MED ORDER — FENTANYL CITRATE 0.05 MG/ML IJ SOLN
25.0000 ug | INTRAMUSCULAR | Status: DC | PRN
  Administered 2012-06-21 (×2): 25 ug via INTRAVENOUS
  Administered 2012-06-21: 100 ug via INTRAVENOUS
  Filled 2012-06-21: qty 1

## 2012-06-21 MED ORDER — FENTANYL CITRATE 0.05 MG/ML IJ SOLN
INTRAMUSCULAR | Status: DC | PRN
  Administered 2012-06-21: 25 ug via INTRAVENOUS
  Administered 2012-06-21: 50 ug via INTRAVENOUS
  Administered 2012-06-21: 100 ug via INTRAVENOUS
  Administered 2012-06-21: 25 ug via INTRAVENOUS

## 2012-06-21 MED ORDER — CEFAZOLIN SODIUM-DEXTROSE 2-3 GM-% IV SOLR
2.0000 g | INTRAVENOUS | Status: AC
  Administered 2012-06-21: 2 g via INTRAVENOUS
  Filled 2012-06-21: qty 50

## 2012-06-21 MED ORDER — OXYCODONE-ACETAMINOPHEN 10-325 MG PO TABS: 1.0000 | ORAL_TABLET | ORAL | Status: DC | PRN

## 2012-06-21 MED ORDER — LACTATED RINGERS IV SOLN
INTRAVENOUS | Status: DC
  Filled 2012-06-21: qty 1000

## 2012-06-21 MED ORDER — GENTAMICIN SULFATE 40 MG/ML IJ SOLN
160.0000 mg | INTRAVENOUS | Status: DC
  Filled 2012-06-21: qty 4

## 2012-06-21 MED ORDER — ACETAMINOPHEN 10 MG/ML IV SOLN
INTRAVENOUS | Status: DC | PRN
  Administered 2012-06-21: 1000 mg via INTRAVENOUS

## 2012-06-21 MED ORDER — CHLORHEXIDINE GLUCONATE 4 % EX LIQD
Freq: Once | CUTANEOUS | Status: DC
  Filled 2012-06-21: qty 15

## 2012-06-21 MED ORDER — PROPOFOL 10 MG/ML IV BOLUS
INTRAVENOUS | Status: DC | PRN
  Administered 2012-06-21: 250 mg via INTRAVENOUS

## 2012-06-21 MED ORDER — PROMETHAZINE HCL 25 MG/ML IJ SOLN
6.2500 mg | INTRAMUSCULAR | Status: DC | PRN
  Filled 2012-06-21: qty 1

## 2012-06-21 MED ORDER — ONDANSETRON HCL 4 MG/2ML IJ SOLN
INTRAMUSCULAR | Status: DC | PRN
  Administered 2012-06-21: 4 mg via INTRAVENOUS

## 2012-06-21 MED ORDER — SODIUM CHLORIDE 0.9 % IR SOLN
Status: DC | PRN
  Administered 2012-06-21: 10:00:00

## 2012-06-21 MED ORDER — DEXAMETHASONE SODIUM PHOSPHATE 4 MG/ML IJ SOLN
INTRAMUSCULAR | Status: DC | PRN
  Administered 2012-06-21 (×2): 4 mg via INTRAVENOUS

## 2012-06-21 MED ORDER — MEPERIDINE HCL 25 MG/ML IJ SOLN
6.2500 mg | INTRAMUSCULAR | Status: DC | PRN
  Filled 2012-06-21: qty 1

## 2012-06-21 MED ORDER — LACTATED RINGERS IV SOLN
INTRAVENOUS | Status: DC
  Administered 2012-06-21 (×2): via INTRAVENOUS
  Filled 2012-06-21: qty 1000

## 2012-06-21 MED ORDER — METOCLOPRAMIDE HCL 5 MG/ML IJ SOLN
INTRAMUSCULAR | Status: DC | PRN
  Administered 2012-06-21: 10 mg via INTRAVENOUS

## 2012-06-21 MED ORDER — MIDAZOLAM HCL 5 MG/5ML IJ SOLN
INTRAMUSCULAR | Status: DC | PRN
  Administered 2012-06-21: 1 mg via INTRAVENOUS

## 2012-06-21 SURGICAL SUPPLY — 68 items
ADH SKN CLS APL DERMABOND .7 (GAUZE/BANDAGES/DRESSINGS) ×1
AMS 700LGX MS pump ×1 IMPLANT
APL SKNCLS STERI-STRIP NONHPOA (GAUZE/BANDAGES/DRESSINGS) ×1
APPLICATOR COTTON TIP 6IN STRL (MISCELLANEOUS) IMPLANT
Acessory Kit ×1 IMPLANT
BAG DECANTER FOR FLEXI CONT (MISCELLANEOUS) IMPLANT
BAG URINE DRAINAGE (UROLOGICAL SUPPLIES) ×2 IMPLANT
BANDAGE CO FLEX L/F 2IN X 5YD (GAUZE/BANDAGES/DRESSINGS) ×1 IMPLANT
BANDAGE GAUZE ELAST BULKY 4 IN (GAUZE/BANDAGES/DRESSINGS) ×2 IMPLANT
BENZOIN TINCTURE PRP APPL 2/3 (GAUZE/BANDAGES/DRESSINGS) ×2 IMPLANT
BLADE HEX COATED 2.75 (ELECTRODE) ×2 IMPLANT
BLADE SURG 10 STRL SS (BLADE) ×1 IMPLANT
BLADE SURG 15 STRL LF DISP TIS (BLADE) ×2 IMPLANT
BLADE SURG 15 STRL SS (BLADE) ×4
BLADE SURG ROTATE 9660 (MISCELLANEOUS) ×1 IMPLANT
CANISTER SUCTION 1200CC (MISCELLANEOUS) IMPLANT
CANISTER SUCTION 2500CC (MISCELLANEOUS) ×1 IMPLANT
CATH FOLEY 2WAY SLVR  5CC 16FR (CATHETERS) ×1
CATH FOLEY 2WAY SLVR 5CC 16FR (CATHETERS) ×1 IMPLANT
CHLORAPREP W/TINT 26ML (MISCELLANEOUS) ×2 IMPLANT
CLOTH BEACON ORANGE TIMEOUT ST (SAFETY) ×2 IMPLANT
COVER MAYO STAND STRL (DRAPES) ×4 IMPLANT
COVER TABLE BACK 60X90 (DRAPES) ×2 IMPLANT
DERMABOND ADVANCED (GAUZE/BANDAGES/DRESSINGS) ×1
DERMABOND ADVANCED .7 DNX12 (GAUZE/BANDAGES/DRESSINGS) IMPLANT
DISSECTOR ROUND CHERRY 3/8 STR (MISCELLANEOUS) ×1 IMPLANT
DRAPE EXTREMITY T 121X128X90 (DRAPE) ×2 IMPLANT
DRAPE LAPAROTOMY TRNSV 102X78 (DRAPE) IMPLANT
DRSG TEGADERM 4X4.75 (GAUZE/BANDAGES/DRESSINGS) ×2 IMPLANT
ELECT REM PT RETURN 9FT ADLT (ELECTROSURGICAL) ×2
ELECTRODE REM PT RTRN 9FT ADLT (ELECTROSURGICAL) ×1 IMPLANT
GAUZE SPONGE 4X4 12PLY STRL LF (GAUZE/BANDAGES/DRESSINGS) ×2 IMPLANT
GLOVE BIO SURGEON STRL SZ8 (GLOVE) ×2 IMPLANT
GLOVE INDICATOR 6.5 STRL GRN (GLOVE) ×1 IMPLANT
GLOVE INDICATOR 7.0 STRL GRN (GLOVE) ×2 IMPLANT
GOWN PREVENTION PLUS LG XLONG (DISPOSABLE) ×2 IMPLANT
GOWN STRL REIN XL XLG (GOWN DISPOSABLE) ×2 IMPLANT
HOLDER FOLEY CATH W/STRAP (MISCELLANEOUS) ×2 IMPLANT
NS IRRIG 500ML POUR BTL (IV SOLUTION) ×2 IMPLANT
PACK BASIN DAY SURGERY FS (CUSTOM PROCEDURE TRAY) ×2 IMPLANT
PENCIL BUTTON HOLSTER BLD 10FT (ELECTRODE) ×2 IMPLANT
PLUG CATH AND CAP STER (CATHETERS) ×2 IMPLANT
RETRACTOR WILSON SYSTEM (INSTRUMENTS) ×2 IMPLANT
SPONGE GAUZE 4X4 12PLY (GAUZE/BANDAGES/DRESSINGS) ×1 IMPLANT
SPONGE LAP 18X18 X RAY DECT (DISPOSABLE) IMPLANT
SPONGE LAP 4X18 X RAY DECT (DISPOSABLE) ×6 IMPLANT
STRIP CLOSURE SKIN 1/2X4 (GAUZE/BANDAGES/DRESSINGS) IMPLANT
SUPPORT SCROTAL LG STRP (MISCELLANEOUS) ×1 IMPLANT
SURGILUBE 2OZ TUBE FLIPTOP (MISCELLANEOUS) ×2 IMPLANT
SUT CHROMIC 3 0 SH 27 (SUTURE) ×5 IMPLANT
SUT CHROMIC 4 0 RB 1X27 (SUTURE) ×2 IMPLANT
SUT MNCRL AB 4-0 PS2 18 (SUTURE) ×2 IMPLANT
SUT PDS AB 2-0 CT2 27 (SUTURE) ×14 IMPLANT
SUT VIC AB 2-0 UR6 27 (SUTURE) ×4 IMPLANT
SUT VIC AB 3-0 SH 27 (SUTURE)
SUT VIC AB 3-0 SH 27X BRD (SUTURE) IMPLANT
SUT VICRYL 0 UR6 27IN ABS (SUTURE) IMPLANT
SUT VICRYL 4-0 PS2 18IN ABS (SUTURE) IMPLANT
SYR 20CC LL (SYRINGE) ×2 IMPLANT
SYR 50ML LL SCALE MARK (SYRINGE) ×4 IMPLANT
SYR BULB IRRIGATION 50ML (SYRINGE) ×3 IMPLANT
SYRINGE 10CC LL (SYRINGE) ×2 IMPLANT
TOWEL OR 17X24 6PK STRL BLUE (TOWEL DISPOSABLE) ×6 IMPLANT
TRAY DSU PREP LF (CUSTOM PROCEDURE TRAY) ×2 IMPLANT
TUBE CONNECTING 12X1/4 (SUCTIONS) ×2 IMPLANT
WATER STERILE IRR 3000ML UROMA (IV SOLUTION) IMPLANT
WATER STERILE IRR 500ML POUR (IV SOLUTION) ×2 IMPLANT
YANKAUER SUCT BULB TIP NO VENT (SUCTIONS) ×2 IMPLANT

## 2012-06-21 NOTE — Transfer of Care (Signed)
Immediate Anesthesia Transfer of Care Note  Patient: [name]  Procedure(s) Performed: Procedure(s) (LRB): PENILE PROTHESIS INFLATABLE (N/A)  Patient Location: PACU  Anesthesia Type: General  Level of Consciousness: awake, alert  and oriented  Airway & Oxygen Therapy: Patient Spontanous Breathing and Patient connected to face mask oxygen  Post-op Assessment: Report given to PACU RN and Post -op Vital signs reviewed and stable  Post vital signs: Reviewed and stable  Complications: No apparent anesthesia complications

## 2012-06-21 NOTE — Op Note (Signed)
PATIENT:  [name]  PRE-OPERATIVE DIAGNOSIS:  Malfunctioning 3 piece inflatable penile prosthesis.  POST-OPERATIVE DIAGNOSIS:  Same  PROCEDURE:  Procedure(s): 1. Scrotal exploration. 2. Removal and replacement of prosthesis pump. 3. Excision of scar tissue.  SURGEON:  Claybon Jabs  INDICATION: [name] is a 66 year old male who had a radical prostatectomy for prostate cancer. He developed erectile dysfunction and eventually elected to proceed with penile prosthesis. I inserted the prosthesis about a year ago but postoperatively he was unable to inflate the device. It would deflate partially but inflation was extremely difficult. I confirmed this on physical examination. We discussed the treatment options and elected to proceed with exploration of the scrotum with replacement of whatever parts necessary in order to restore functionality to the device.  ANESTHESIA:  General  EBL:  Minimal  DRAINS:  none  LOCAL MEDICATIONS USED:  None  SPECIMEN:   aerobic and anaerobic cultures of fluid surrounding the pump.  DISPOSITION OF SPECIMEN:  Microbiology  Description of procedure: After informed consent the patient was taken to the major operating room. He received 2 g of Ancef and gentamicin based on weight intravenously. He was administered general anesthesia. He then underwent a full 10 minute scrub and then prepped and draped. An official timeout was then performed.  I initially passed a 32 French Foley catheter and the bladder drained his bladder. I then made a midline median raphae scrotal incision over the pump and carried this down to the pump with the Bovie electrocautery unit. I noted some blood-tinged fluid surrounding the pump and this was cultured aerobically and anaerobically. I then delivered the pump through what appeared to be a very thick rind of scar tissue that was completely encompassing the pump. When I had the pump completely freed I was able to inflate  and deflate the prosthesis with no difficulty whatsoever. This indicated the cause for the malfunction was the thick rind of scar tissue that had formed around the pump. The patient was having absolutely no symptoms of infection and this did not appear to be purulent fluid. I suspect he may have had some hematoma that surrounded the pump and resulted in excess scar tissue formation. I therefore elected to excise all of the scar tissue/rind.  Using the Bovie electrocautery unit I excised all of the excess scar tissue that was surrounding the pump and the tubing down to the corpora on each side. In doing so I felt that the length of the tubing limited optimal placement of the pump in the dependent portion of the scrotum and therefore elected to remove the old pump didn't replace it with a new pump and longer tubing. I therefore placed shod hemostat on the tubing from each of the corpus cavernosum and from the reservoir. I divided these tubes and remove the pump. A new pump was prepared and I then divided the tubing attaching it to the cylinders allowing longer tubing to be reattached. I then reattached all 3 of the tubes in the standard fashion connecting the clear tubes from each of the corpora to clear tubes of the pump and the tubing with the black spiral was anastomosed with the black spiral tubing from the reservoir. I then removed the shod hemostat from all of the tubes and inflated and deflated the device. It cycled properly without any significant resistance.  I copiously irrigated the scrotum with anabolic irrigation. I then developed a new pocket for the pump in the dependent portion of the right hemiscrotum. This  was placed in that location and was easily palpable. I then closed the deep scrotal tissue with running 3-0 chromic suture. A second layer of running 3-0 chromic was then placed and I then reapproximated the skin edges with running 3-0 chromic. Neosporin, a sterile gauze dressing and then a  fluffed Curlex were then applied as well as a scrotal support. The patient was then awakened and taken recovery room in stable and satisfactory condition. He tolerated procedure well no intraoperative complications.  PLAN OF CARE: Discharge to home after PACU  PATIENT DISPOSITION:  PACU - hemodynamically stable.

## 2012-06-21 NOTE — Anesthesia Postprocedure Evaluation (Signed)
  Anesthesia Post-op Note  Patient: [name]  Procedure(s) Performed: Procedure(s) (LRB): PENILE PROTHESIS INFLATABLE (N/A)  Patient Location: PACU  Anesthesia Type: General  Level of Consciousness: awake and alert   Airway and Oxygen Therapy: Patient Spontanous Breathing  Post-op Pain: mild  Post-op Assessment: Post-op Vital signs reviewed, Patient's Cardiovascular Status Stable, Respiratory Function Stable, Patent Airway and No signs of Nausea or vomiting  Last Vitals:  Filed Vitals:   06/21/12 1130  BP: 165/74  Pulse: 53  Temp:   Resp: 12    Post-op Vital Signs: stable   Complications: No apparent anesthesia complications

## 2012-06-21 NOTE — Anesthesia Preprocedure Evaluation (Signed)
Anesthesia Evaluation  Patient identified by MRN, date of birth, ID band Patient awake    Reviewed: Allergy & Precautions, H&P , NPO status , Patient's Chart, lab work & pertinent test results  Airway Mallampati: I TM Distance: >3 FB Neck ROM: Full    Dental No notable dental hx. (+) Edentulous Upper and Edentulous Lower   Pulmonary neg pulmonary ROS, COPDformer smoker,  breath sounds clear to auscultation  Pulmonary exam normal       Cardiovascular hypertension, Pt. on medications + dysrhythmias Atrial Fibrillation Rhythm:Regular Rate:Normal     Neuro/Psych negative neurological ROS  negative psych ROS   GI/Hepatic negative GI ROS, (+)     substance abuse  alcohol use and marijuana use,   Endo/Other  diabetes  Renal/GU negative Renal ROS  negative genitourinary   Musculoskeletal negative musculoskeletal ROS (+)   Abdominal   Peds negative pediatric ROS (+)  Hematology negative hematology ROS (+)   Anesthesia Other Findings   Reproductive/Obstetrics negative OB ROS                           Anesthesia Physical Anesthesia Plan  ASA: III  Anesthesia Plan: General   Post-op Pain Management:    Induction: Intravenous  Airway Management Planned:   Additional Equipment:   Intra-op Plan:   Post-operative Plan:   Informed Consent: I have reviewed the patients History and Physical, chart, labs and discussed the procedure including the risks, benefits and alternatives for the proposed anesthesia with the patient or authorized representative who has indicated his/her understanding and acceptance.   Dental advisory given  Plan Discussed with: CRNA  Anesthesia Plan Comments:         Anesthesia Quick Evaluation

## 2012-06-21 NOTE — H&P (Signed)
History of Present Illness          [name] is a 66 year old with the following urologic history:        1) Prostate cancer: He is s/p a BNS RAL radical prostatectomy on September 17, 2005. His PSA has thus far been undetectable since treatment.        TNM stage: pT2a Nx Mx    Gleason score: 3+4=7    Surgical margins: Negative    Pretreatment PSA: 5.5        2) Erectile dysfunction: Pretreatment SHIM was 21. He has failed PDE-5 inhibitors.  He has tried Trimix which has been effective but also resulted in multiple episodes of priapism.        3) Urinary frequency: He has tried Vesicare in the past which did not help.        4) UTI: He developed an E.coli UTI in July 2009 with positive blood cultures.        Interval history: When I saw him in followup for his penile prosthesis and attempted to cycle the device had a great deal of difficulty inflating and deflating it. My suspicion is that there is some obstruction of the tubing and not that there has been leakage from the system. I used fairly long cylinders which may have allowed compression of the portion of tubing that is in the corpora to have become compressed by the cylinders although there certainly are other possibilities for this difficulty. Regardless I have recommended reexploration and revision and he has returned today to schedule his surgery.    Past Medical History Problems  1. History of  Arthritis V13.4 2. History of  Coronary Artery Disease V12.59 3. History of  Hypertension 401.9 4. Prostate Cancer 185  Surgical History Problems  1. History of  Prostatect Retropubic Radical W/ Nerve Sparing Laparoscopic 2. History of  Surg Penis Insertion Of Penile Prosthesis  Current Meds 1. Amiodarone HCl 200 MG Oral Tablet; Therapy: [id number] to 2. AmLODIPine Besylate 5 MG Oral Tablet; Therapy: [id number] to 3. Aspirin 81 MG Oral Tablet; Therapy: (Recorded:16Jan2012) to 4. Benazepril HCl 40 MG Oral Tablet; Therapy:  [id number] to 5. Furosemide 40 MG Oral Tablet; Therapy: [id number] to 6. Klor-Con M20 20 MEQ Oral Tablet Extended Release; Therapy: [id number] to 7. Omeprazole 20 MG Oral Capsule Delayed Release; Therapy: [id number] to 8. Pindolol 5 MG Oral Tablet; Therapy: [id number] to 9. Pravastatin Sodium 40 MG Oral Tablet; Therapy: [id number] to 10. Spiriva HandiHaler 18 MCG Inhalation Capsule; Therapy: [id number] to  Allergies Medication  1. Cipro TABS  Family History Problems  1. Family history of  Prostate Cancer V16.42 2. Family history of  Urologic Disorder V18.7 kidney stones  Social History Problems  1. Alcohol Use 2. Occupation: Mining engineer 3. Tobacco Use V15.82 1 pack for 20 + years  Vitals Vital Signs  BMI Calculated: 33.13 BSA Calculated: 2.36 Height: 6 ft 1 in Weight: 250 lb  Blood Pressure: 192 / 90 Heart Rate: 60  Review of Systems Genitourinary, constitutional, skin, eye, otolaryngeal, hematologic/lymphatic, cardiovascular, pulmonary, endocrine, musculoskeletal, gastrointestinal, neurological and psychiatric system(s) were reviewed and pertinent findings if present are noted.  Genitourinary: urinary frequency and erectile dysfunction.    Physical Exam Constitutional: Well nourished and well developed . No acute distress.  ENT:. The ears and nose are normal in appearance.  Neck: The appearance of the neck is normal and no neck mass is present.  Pulmonary: No respiratory distress and normal respiratory rhythm and effort.  Cardiovascular: Heart rate and rhythm are normal . No peripheral edema.  Abdomen: The abdomen is soft and nontender. No masses are palpated. No CVA tenderness. No hernias are palpable. No hepatosplenomegaly noted.  Rectal: Rectal exam demonstrates normal sphincter tone, no tenderness and no masses. The prostate has no nodularity and is not tender. The left seminal vesicle is nonpalpable. The right seminal vesicle is nonpalpable. The perineum is normal on  inspection.  Genitourinary: Examination of the penis demonstrates a penile prosthesis, but no discharge, no masses, no lesions and a normal meatus. The scrotum is without lesions. The right epididymis is palpably normal and non-tender. The left epididymis is palpably normal and non-tender. The right testis is non-tender and without masses. The left testis is non-tender and without masses. His pump is located in the right hemiscrotum is nontender. Attempts at inflation/deflation were unsuccessful. Lymphatics: The femoral and inguinal nodes are not enlarged or tender.  Skin: Normal skin turgor, no visible rash and no visible skin lesions.  Neuro/Psych:. Mood and affect are appropriate.   Assessment Assessed  1. Mechanical Complication Of Genitourinary Device 996.30 2. Organic Impotence 607.84   I again went over the options because he wanted to know if the prosthesis could be just left in place with nothing being done. He wanted to know if there was any risk to that I told him that that was not a significant risk although he currently is not functioning properly and he would like to have revision. He did also inquire about its complete removal and we discussed that today. That certainly is an option but he wanted to proceed with repair of the device. We again went over the procedure and the incisions used to achieve this. We also discussed the risks and complications including that of infection. He understands and has elected to proceed.   Plan He is scheduled for revision of his IPP as an outpatient.

## 2012-06-21 NOTE — Anesthesia Procedure Notes (Signed)
Procedure Name: LMA Insertion Date/Time: 06/21/2012 9:30 AM Performed by: Mechele Claude Pre-anesthesia Checklist: Patient identified, Emergency Drugs available, Suction available and Patient being monitored Patient Re-evaluated:Patient Re-evaluated prior to inductionOxygen Delivery Method: Circle System Utilized Preoxygenation: Pre-oxygenation with 100% oxygen Intubation Type: IV induction Ventilation: Mask ventilation without difficulty LMA: LMA with gastric port inserted LMA Size: 5.0 Number of attempts: 1 Placement Confirmation: positive ETCO2 Tube secured with: Tape Dental Injury: Teeth and Oropharynx as per pre-operative assessment

## 2012-06-23 LAB — WOUND CULTURE: Culture: NO GROWTH

## 2012-06-24 ENCOUNTER — Encounter (HOSPITAL_BASED_OUTPATIENT_CLINIC_OR_DEPARTMENT_OTHER): Payer: Self-pay | Admitting: Urology

## 2012-06-26 LAB — ANAEROBIC CULTURE

## 2012-06-29 DIAGNOSIS — N529 Male erectile dysfunction, unspecified: Secondary | ICD-10-CM | POA: Diagnosis not present

## 2012-06-29 DIAGNOSIS — T8389XA Other specified complication of genitourinary prosthetic devices, implants and grafts, initial encounter: Secondary | ICD-10-CM | POA: Diagnosis not present

## 2012-07-29 DIAGNOSIS — T8389XA Other specified complication of genitourinary prosthetic devices, implants and grafts, initial encounter: Secondary | ICD-10-CM | POA: Diagnosis not present

## 2012-07-29 DIAGNOSIS — C61 Malignant neoplasm of prostate: Secondary | ICD-10-CM | POA: Diagnosis not present

## 2012-08-09 ENCOUNTER — Encounter: Payer: Self-pay | Admitting: Internal Medicine

## 2012-08-09 ENCOUNTER — Ambulatory Visit (INDEPENDENT_AMBULATORY_CARE_PROVIDER_SITE_OTHER): Payer: Medicare Other | Admitting: Internal Medicine

## 2012-08-09 ENCOUNTER — Other Ambulatory Visit (INDEPENDENT_AMBULATORY_CARE_PROVIDER_SITE_OTHER): Payer: Medicare Other

## 2012-08-09 VITALS — BP 132/62 | HR 59 | Temp 98.2°F | Ht 74.0 in | Wt 270.1 lb

## 2012-08-09 DIAGNOSIS — K5900 Constipation, unspecified: Secondary | ICD-10-CM | POA: Diagnosis not present

## 2012-08-09 DIAGNOSIS — R195 Other fecal abnormalities: Secondary | ICD-10-CM

## 2012-08-09 DIAGNOSIS — E119 Type 2 diabetes mellitus without complications: Secondary | ICD-10-CM | POA: Diagnosis not present

## 2012-08-09 DIAGNOSIS — I10 Essential (primary) hypertension: Secondary | ICD-10-CM

## 2012-08-09 LAB — CBC WITH DIFFERENTIAL/PLATELET
Basophils Absolute: 0 10*3/uL (ref 0.0–0.1)
Basophils Relative: 0.5 % (ref 0.0–3.0)
Eosinophils Absolute: 0.1 10*3/uL (ref 0.0–0.7)
Eosinophils Relative: 2.1 % (ref 0.0–5.0)
HCT: 40.4 % (ref 39.0–52.0)
Hemoglobin: 13.4 g/dL (ref 13.0–17.0)
Lymphocytes Relative: 32.5 % (ref 12.0–46.0)
Lymphs Abs: 1.8 10*3/uL (ref 0.7–4.0)
MCHC: 33.1 g/dL (ref 30.0–36.0)
MCV: 87.3 fl (ref 78.0–100.0)
Monocytes Absolute: 0.7 10*3/uL (ref 0.1–1.0)
Monocytes Relative: 13.7 % — ABNORMAL HIGH (ref 3.0–12.0)
Neutro Abs: 2.8 10*3/uL (ref 1.4–7.7)
Neutrophils Relative %: 51.2 % (ref 43.0–77.0)
Platelets: 225 10*3/uL (ref 150.0–400.0)
RBC: 4.63 Mil/uL (ref 4.22–5.81)
RDW: 15.6 % — ABNORMAL HIGH (ref 11.5–14.6)
WBC: 5.4 10*3/uL (ref 4.5–10.5)

## 2012-08-09 LAB — BASIC METABOLIC PANEL
BUN: 16 mg/dL (ref 6–23)
CO2: 28 mEq/L (ref 19–32)
Calcium: 8.8 mg/dL (ref 8.4–10.5)
Chloride: 103 mEq/L (ref 96–112)
Creatinine, Ser: 1.4 mg/dL (ref 0.4–1.5)
GFR: 64.22 mL/min (ref >60.00)
Glucose, Bld: 108 mg/dL — ABNORMAL HIGH (ref 70–99)
Potassium: 4.3 mEq/L (ref 3.5–5.1)
Sodium: 137 mEq/L (ref 135–145)

## 2012-08-09 LAB — HEPATIC FUNCTION PANEL
ALT: 21 U/L (ref 0–53)
AST: 26 U/L (ref 0–37)
Albumin: 3.5 g/dL (ref 3.5–5.2)
Alkaline Phosphatase: 106 U/L (ref 39–117)
Bilirubin, Direct: 0.1 mg/dL (ref 0.0–0.3)
Total Bilirubin: 0.4 mg/dL (ref 0.3–1.2)
Total Protein: 8.3 g/dL (ref 6.0–8.3)

## 2012-08-09 NOTE — Progress Notes (Signed)
Subjective:    Patient ID: [name], male    DOB: 1946-02-13, 67 y.o.   MRN: [id number]  HPI  Here to f/u., has been seeing urology (last visit jan 14) as well as cardiology/DrWeintruab, last visit sept 2013, last seen here juen 2012.  Has been getting some revision of his penile prosthesis, o/w doing ok, except for 1 wk unusual constipation like issue with strain at stool and ? change in caliber of stool,has sensation of seeming partial blockage.  Denies worsening reflux, abd pain, dysphagia, n/v, or blood.  Had what he thought was issue with hemorrhoid with small volume painless BRBPR on tissue and in water (not stool), none since,  Used some prep H and seemed to not recur.  Last colonoscopy 2008, due for f/u now per [place]/Dr Deatra Ina, just hasnt called yet.  Pt denies fever, wt loss, night sweats, loss of appetite, or other constitutional symptoms. No overt bleeding recent per pt. Has been taking statin and lasix regularly, denies recent diet change, or orthostasis to account for any constipation Past Medical History  Diagnosis Date  . COPD (chronic obstructive pulmonary disease)   . Hyperlipidemia   . Anxiety   . Diabetes mellitus type II     diet  . HTN (hypertension)   . LBP (low back pain)   . BPH (benign prostatic hyperplasia)   . Paresthesias     lower extremity  . Diverticulitis     colon  . History of prostate cancer   . Lumbar disc disease   . ETOHism     remission  . Nephrolithiasis   . ED (erectile dysfunction)   . UTI (lower urinary tract infection)   . Hx of colonic polyps   . Adenoma 05/2008  . Atrial fibrillation or flutter 01/02/2011  . Complex renal cyst 01/13/2011   Past Surgical History  Procedure Date  . Prostatectomy   . Penile prosthesis placement 09/2010  . US echocardiography 04/2012  . Penile prosthesis implant 06/21/2012    Procedure: PENILE PROTHESIS INFLATABLE;  Surgeon: Claybon Jabs, MD;  Location: [facility];  Service:  Urology;  Laterality: N/A;  REMOVAL AND REPLACEMENT OF SOME  OF PROSTHESIS (AMS)     reports that he has quit smoking. He does not have any smokeless tobacco history on file. He reports that he drinks alcohol. His drug history not on file. family history includes Coronary artery disease in his father and mother; Hypertension in an unspecified family member; and Prostate cancer in his father. Allergies  Allergen Reactions  . Ciprofloxacin Other (See Comments)    All over weakness   Current Outpatient Prescriptions on File Prior to Visit  Medication Sig Dispense Refill  . acetaminophen (TYLENOL) 500 MG tablet Take 500 mg by mouth 3 (three) times daily.        Marland Kitchen amiodarone (PACERONE) 200 MG tablet Take 200 mg by mouth daily.      Marland Kitchen amLODipine (NORVASC) 5 MG tablet Take 5 mg by mouth daily.      Marland Kitchen aspirin 81 MG EC tablet Take 81 mg by mouth daily.        . benazepril (LOTENSIN) 40 MG tablet TAKE 1 TABLET BY MOUTH TWICE A DAY  180 tablet  3  . ciprofloxacin (CIPRO) 500 MG tablet Take 1 tablet (500 mg total) by mouth 2 (two) times daily.  14 tablet  0  . cyclobenzaprine (FLEXERIL) 5 MG tablet TAKE 1 TABLET (5 MG TOTAL) BY MOUTH 3 (THREE)  TIMES DAILY AS NEEDED FOR MUSCLE SPASMS.  60 tablet  0  . diltiazem (CARDIZEM LA) 180 MG 24 hr tablet Take 180 mg by mouth 2 (two) times daily.        Marland Kitchen HYDROcodone-acetaminophen (NORCO) 5-325 MG per tablet Take 1-2 tablets by mouth 2 (two) times daily as needed.  60 tablet  2  . ibuprofen (ADVIL,MOTRIN) 200 MG tablet Take 200 mg by mouth as directed.        . lactase (LACTAID) 3000 UNITS tablet Take 1 tablet by mouth 3 (three) times daily as needed.        Marland Kitchen omeprazole (PRILOSEC) 20 MG capsule TAKE ONE CAPSULE BY MOUTH EVERY DAY AS NEEDED  30 capsule  0  . oxyCODONE-acetaminophen (PERCOCET) 10-325 MG per tablet Take 1 tablet by mouth every 4 (four) hours as needed for pain.  30 tablet  0  . pindolol (VISKEN) 5 MG tablet Take 2.5 mg by mouth 2 (two) times daily.         . potassium chloride (KLOR-CON) 20 MEQ packet Take 20 mEq by mouth daily.      . pravastatin (PRAVACHOL) 40 MG tablet Take 40 mg by mouth daily.        Marland Kitchen PROVENTIL HFA 108 (90 BASE) MCG/ACT inhaler INHALE 2 PUFFS EVERY TWELVE HOURS  1 Inhaler  8  . SPIRIVA HANDIHALER 18 MCG inhalation capsule INHALE THE CONTENTS OF 1 CAPSULE EVERY DAY  90 each  3  . furosemide (LASIX) 40 MG tablet Take 1 tablet (40 mg total) by mouth daily.  30 tablet  11   Review of Systems  Constitutional: Negative for unexpected weight change, or unusual diaphoresis  HENT: Negative for tinnitus.   Eyes: Negative for photophobia and visual disturbance.  Respiratory: Negative for choking and stridor.   Gastrointestinal: Negative for vomiting and blood in stool.  Genitourinary: Negative for hematuria and decreased urine volume.  Musculoskeletal: Negative for acute joint swelling Skin: Negative for color change and wound.  Neurological: Negative for tremors and numbness other than noted  Psychiatric/Behavioral: Negative for decreased concentration or  hyperactivity.       Objective:   Physical Exam BP 132/62  Pulse 59  Temp 98.2 F (36.8 C) (Oral)  Ht 6\' 2"  (1.88 m)  Wt 270 lb 2 oz (122.528 kg)  BMI 34.68 kg/m2  SpO2 97% VS noted, not ill appearing Constitutional: Pt appears well-developed and well-nourished.  HENT: Head: NCAT.  Right Ear: External ear normal.  Left Ear: External ear normal.  Eyes: Conjunctivae and EOM are normal. Pupils are equal, round, and reactive to light.  Neck: Normal range of motion. Neck supple.  Cardiovascular: Normal rate and regular rhythm.   Pulmonary/Chest: Effort normal and breath sounds normal.  Abd:  Soft, NT, non-distended, + BS with diffuse abd tender to mod palpation, no guarding or rebound Neurological: Pt is alert. Not confused  Skin: Skin is warm. No erythema. Has trace bilat pedal edema Psychiatric: Pt behavior is normal. Thought content normal.  DRE: difficult  exam due to pt resistance it seems, s/p prostatectomy, no obvious ext hemorrhoid or rectal mass, but Heme +, brown stoo    Assessment & Plan:

## 2012-08-09 NOTE — Assessment & Plan Note (Addendum)
No overt bleeding but heme pos on DRE, for cbc today, Please continue all other medications as before, refer GI as he is due for f/u colonscopy at this point as well

## 2012-08-09 NOTE — Patient Instructions (Addendum)
Please take colace 100 mg twice per day for stool softening (OTC) - but ok to hold an occasional dose if the stool gets "too" soft Please continue all other medications as before, and refills have been done if requested. You will be contacted regarding the referral for: Gastroenterology (an urgent referral is placed) Please go to the LAB in the Basement (turn left off the elevator) for the tests to be done today You will be contacted by phone if any changes need to be made immediately.  Otherwise, you will receive a letter about your results with an explanation, but please check with MyChart first. Please keep your appointments with your specialists as you have planned - cardiology and urology Please remember to sign up for My Chart if you have not done so, as this will be important to you in the future with finding out test results, communicating by private email, and scheduling acute appointments online when needed.

## 2012-08-09 NOTE — Assessment & Plan Note (Addendum)
Unclear etiology, exam not very helpful other than found heme pos, will need f/u with GI and likely needs colonsocpy as above, for now only colace 100 bid prn

## 2012-08-15 ENCOUNTER — Encounter: Payer: Self-pay | Admitting: Internal Medicine

## 2012-08-15 NOTE — Assessment & Plan Note (Signed)
stable overall by history and exam, recent data reviewed with pt, and pt to continue medical treatment as before,  to f/u any worsening symptoms or concerns BP Readings from Last 3 Encounters:  08/09/12 132/62  06/21/12 180/72  06/21/12 180/72

## 2012-08-15 NOTE — Assessment & Plan Note (Signed)
stable overall by history and exam, recent data reviewed with pt, and pt to continue medical treatment as before,  to f/u any worsening symptoms or concerns Lab Results  Component Value Date   HGBA1C 6.7* 08/27/2009

## 2012-08-17 DIAGNOSIS — I10 Essential (primary) hypertension: Secondary | ICD-10-CM | POA: Diagnosis not present

## 2012-08-17 DIAGNOSIS — I4891 Unspecified atrial fibrillation: Secondary | ICD-10-CM | POA: Diagnosis not present

## 2012-08-17 DIAGNOSIS — I495 Sick sinus syndrome: Secondary | ICD-10-CM | POA: Diagnosis not present

## 2012-08-25 ENCOUNTER — Telehealth: Payer: Self-pay | Admitting: Internal Medicine

## 2012-08-25 MED ORDER — ALBUTEROL SULFATE HFA 108 (90 BASE) MCG/ACT IN AERS: 2.0000 | INHALATION_SPRAY | Freq: Two times a day (BID) | RESPIRATORY_TRACT | Status: DC

## 2012-08-25 NOTE — Telephone Encounter (Signed)
Called the patient back and he wanted his inhaler sent to his new mail order [facility].  I took care of refill for the patient.

## 2012-08-25 NOTE — Telephone Encounter (Signed)
Patient is requesting a call back to discuss his situation and switching pharmcies

## 2012-09-06 ENCOUNTER — Ambulatory Visit (INDEPENDENT_AMBULATORY_CARE_PROVIDER_SITE_OTHER): Payer: Medicare Other | Admitting: Gastroenterology

## 2012-09-06 ENCOUNTER — Encounter: Payer: Self-pay | Admitting: Gastroenterology

## 2012-09-06 VITALS — BP 166/72 | HR 76 | Ht 73.0 in | Wt 267.4 lb

## 2012-09-06 DIAGNOSIS — D126 Benign neoplasm of colon, unspecified: Secondary | ICD-10-CM | POA: Diagnosis not present

## 2012-09-06 DIAGNOSIS — R195 Other fecal abnormalities: Secondary | ICD-10-CM | POA: Diagnosis not present

## 2012-09-06 DIAGNOSIS — Z86010 Personal history of colonic polyps: Secondary | ICD-10-CM

## 2012-09-06 NOTE — Progress Notes (Signed)
History of Present Illness: Pleasant 67 year old Afro-American male with history of colon polyp, COPD, diverticulitis and prostate cancer referred at the request of Dr. Jenny Reichmann for evaluation of change of bowel habits and Hemoccult-positive stool. For several weeks he's had the sense of incomplete evacuation accompanied by small volume stools. On one occasion he saw some blood in the toilet water. Recent physical exam he was noted to be Hemoccult-positive. In January, 2014 hemoglobin was 13.4. A serrated adenoma was removed in 2008. He has a history of hemorrhoids.    Past Medical History  Diagnosis Date  . COPD (chronic obstructive pulmonary disease)   . Hyperlipidemia   . Anxiety   . Diabetes mellitus type II     diet  . HTN (hypertension)   . LBP (low back pain)   . BPH (benign prostatic hyperplasia)   . Paresthesias     lower extremity  . Diverticulitis     colon  . History of prostate cancer   . Lumbar disc disease   . ETOHism     remission  . Nephrolithiasis   . ED (erectile dysfunction)   . UTI (lower urinary tract infection)   . Hx of colonic polyps   . Adenoma 05/2008  . Atrial fibrillation or flutter 01/02/2011  . Complex renal cyst 01/13/2011   Past Surgical History  Procedure Laterality Date  . Prostatectomy    . Penile prosthesis placement  09/2010  . US echocardiography  04/2012  . Penile prosthesis implant  06/21/2012    Procedure: PENILE PROTHESIS INFLATABLE;  Surgeon: Claybon Jabs, MD;  Location: [facility];  Service: Urology;  Laterality: N/A;  REMOVAL AND REPLACEMENT OF SOME  OF PROSTHESIS (AMS)    family history includes Coronary artery disease in his father and mother; Diabetes in his father; and Prostate cancer in his father. Current Outpatient Prescriptions  Medication Sig Dispense Refill  . acetaminophen (TYLENOL) 500 MG tablet Take 500 mg by mouth 3 (three) times daily.        Marland Kitchen albuterol (PROVENTIL HFA) 108 (90 BASE) MCG/ACT inhaler  Inhale 2 puffs into the lungs every 12 (twelve) hours.  3 Inhaler  3  . amiodarone (PACERONE) 200 MG tablet Take 200 mg by mouth daily.      Marland Kitchen amLODipine (NORVASC) 5 MG tablet Take 5 mg by mouth daily.      Marland Kitchen aspirin 81 MG EC tablet Take 81 mg by mouth daily.        . benazepril (LOTENSIN) 40 MG tablet       . HYDROcodone-acetaminophen (NORCO) 5-325 MG per tablet Take 1-2 tablets by mouth 2 (two) times daily as needed.  60 tablet  2  . ibuprofen (ADVIL,MOTRIN) 200 MG tablet Take 200 mg by mouth as directed.        . lactase (LACTAID) 3000 UNITS tablet Take 1 tablet by mouth 3 (three) times daily as needed.        Marland Kitchen omeprazole (PRILOSEC) 20 MG capsule TAKE ONE CAPSULE BY MOUTH EVERY DAY AS NEEDED  30 capsule  0  . potassium chloride (KLOR-CON) 20 MEQ packet Take 20 mEq by mouth daily.      . pravastatin (PRAVACHOL) 40 MG tablet Take 40 mg by mouth daily.        . furosemide (LASIX) 40 MG tablet Take 40 mg by mouth daily. Alternate 1/2 to 1 tablet daily       No current facility-administered medications for this visit.   Allergies as  of 09/06/2012 - Review Complete 09/06/2012  Allergen Reaction Noted  . Ciprofloxacin Other (See Comments) 06/21/2012    reports that he has quit smoking. His smoking use included Cigarettes. He smoked 0.00 packs per day. He has never used smokeless tobacco. He reports that  drinks alcohol. He reports that he uses illicit drugs (Marijuana).     Review of Systems: Pertinent positive and negative review of systems were noted in the above HPI section. All other review of systems were otherwise negative.  Vital signs were reviewed in today's medical record Physical Exam: General: Well developed , well nourished, no acute distress Skin: anicteric Head: Normocephalic and atraumatic Eyes:  sclerae anicteric, EOMI Ears: Normal auditory acuity Mouth: No deformity or lesions Neck: Supple, no masses or thyromegaly Lungs: Clear throughout to auscultation Heart:  Regular rate and rhythm; no murmurs, rubs or bruits Abdomen: Soft, non tender and non distended. No masses, hepatosplenomegaly or hernias noted. Normal Bowel sounds Rectal:deferred Musculoskeletal: Symmetrical with no gross deformities  Skin: No lesions on visible extremities Pulses:  Normal pulses noted Extremities: No clubbing, cyanosis, edema or deformities noted Neurological: Alert oriented x 4, grossly nonfocal Cervical Nodes:  No significant cervical adenopathy Inguinal Nodes: No significant inguinal adenopathy Psychological:  Alert and cooperative. Normal mood and affect

## 2012-09-06 NOTE — Assessment & Plan Note (Signed)
Plan followup colonoscopy 

## 2012-09-06 NOTE — Patient Instructions (Addendum)
You have been scheduled for a colonoscopy with propofol. Please follow written instructions given to you at your visit today.  Please pick up your prep kit at the pharmacy within the next 1-3 days. If you use inhalers (even only as needed) or a CPAP machine, please bring them with you on the day of your procedure.

## 2012-09-06 NOTE — Assessment & Plan Note (Addendum)
Hemoccult-positive stool in setting of slight change in bowel habits. Recent hemoglobin normall   Bleeding from hemorrhoids, polyps and neoplasm should be ruled out.  Plan colonoscopy

## 2012-09-08 ENCOUNTER — Ambulatory Visit (AMBULATORY_SURGERY_CENTER): Payer: Medicare Other | Admitting: Gastroenterology

## 2012-09-08 ENCOUNTER — Encounter: Payer: Self-pay | Admitting: Gastroenterology

## 2012-09-08 VITALS — BP 138/75 | HR 58 | Temp 98.0°F | Resp 19 | Ht 73.0 in | Wt 267.0 lb

## 2012-09-08 DIAGNOSIS — D126 Benign neoplasm of colon, unspecified: Secondary | ICD-10-CM

## 2012-09-08 DIAGNOSIS — F411 Generalized anxiety disorder: Secondary | ICD-10-CM | POA: Diagnosis not present

## 2012-09-08 DIAGNOSIS — K5730 Diverticulosis of large intestine without perforation or abscess without bleeding: Secondary | ICD-10-CM

## 2012-09-08 DIAGNOSIS — J449 Chronic obstructive pulmonary disease, unspecified: Secondary | ICD-10-CM | POA: Diagnosis not present

## 2012-09-08 DIAGNOSIS — Z860100 Personal history of colon polyps, unspecified: Secondary | ICD-10-CM

## 2012-09-08 DIAGNOSIS — R195 Other fecal abnormalities: Secondary | ICD-10-CM | POA: Diagnosis not present

## 2012-09-08 DIAGNOSIS — E669 Obesity, unspecified: Secondary | ICD-10-CM | POA: Diagnosis not present

## 2012-09-08 DIAGNOSIS — E119 Type 2 diabetes mellitus without complications: Secondary | ICD-10-CM | POA: Diagnosis not present

## 2012-09-08 MED ORDER — SODIUM CHLORIDE 0.9 % IV SOLN: 500.0000 mL | INTRAVENOUS | Status: DC

## 2012-09-08 NOTE — Patient Instructions (Signed)
YOU HAD AN ENDOSCOPIC PROCEDURE TODAY AT THE [facility]: Refer to the procedure report that was given to you for any specific questions about what was found during the examination.  If the procedure report does not answer your questions, please call your gastroenterologist to clarify.  If you requested that your care partner not be given the details of your procedure findings, then the procedure report has been included in a sealed envelope for you to review at your convenience later.  YOU SHOULD EXPECT: Some feelings of bloating in the abdomen. Passage of more gas than usual.  Walking can help get rid of the air that was put into your GI tract during the procedure and reduce the bloating. If you had a lower endoscopy (such as a colonoscopy or flexible sigmoidoscopy) you may notice spotting of blood in your stool or on the toilet paper. If you underwent a bowel prep for your procedure, then you may not have a normal bowel movement for a few days.  DIET: Your first meal following the procedure should be a light meal and then it is ok to progress to your normal diet.  A half-sandwich or bowl of soup is an example of a good first meal.  Heavy or fried foods are harder to digest and may make you feel nauseous or bloated.  Likewise meals heavy in dairy and vegetables can cause extra gas to form and this can also increase the bloating.  Drink plenty of fluids but you should avoid alcoholic beverages for 24 hours.  ACTIVITY: Your care partner should take you home directly after the procedure.  You should plan to take it easy, moving slowly for the rest of the day.  You can resume normal activity the day after the procedure however you should NOT DRIVE or use heavy machinery for 24 hours (because of the sedation medicines used during the test).    SYMPTOMS TO REPORT IMMEDIATELY: A gastroenterologist can be reached at any hour.  During normal business hours, 8:30 AM to 5:00 PM Monday through Friday,  call [phone].  After hours and on weekends, please call the GI answering service at [phone] who will take a message and have the physician on call contact you.   Following lower endoscopy (colonoscopy or flexible sigmoidoscopy):  Excessive amounts of blood in the stool  Significant tenderness or worsening of abdominal pains  Swelling of the abdomen that is new, acute  Fever of 100F or higher   FOLLOW UP: If any biopsies were taken you will be contacted by phone or by letter within the next 1-3 weeks.  Call your gastroenterologist if you have not heard about the biopsies in 3 weeks.  Our staff will call the home number listed on your records the next business day following your procedure to check on you and address any questions or concerns that you may have at that time regarding the information given to you following your procedure. This is a courtesy call and so if there is no answer at the home number and we have not heard from you through the emergency physician on call, we will assume that you have returned to your regular daily activities without incident.  SIGNATURES/CONFIDENTIALITY: You and/or your care partner have signed paperwork which will be entered into your electronic medical record.  These signatures attest to the fact that that the information above on your After Visit Summary has been reviewed and is understood.  Full responsibility of the confidentiality of   this discharge information lies with you and/or your care-partner.    Hemoccult stool cards given to you to (instructions included)   Send back in or bring back in in 2 weeks as ordered by dr Deatra Ina  Information on polyps,diverticulosis,hemorrhoids & high fiber diet given to you today  May take metamucil( over the counter) if needed

## 2012-09-08 NOTE — Progress Notes (Signed)
Called to room to assist during endoscopic procedure.  Patient ID and intended procedure confirmed with present staff. Received instructions for my participation in the procedure from the performing physician.  

## 2012-09-08 NOTE — Progress Notes (Signed)
Lidocaine-40mg IV prior to Propofol InductionPropofol given over incremental dosages 

## 2012-09-08 NOTE — Op Note (Signed)
[place]  [address] [place], [id number]   COLONOSCOPY PROCEDURE REPORT  PATIENT: [name], [name]  MR#: [id number] BIRTHDATE: 1946/06/15 , 57  yrs. old GENDER: Male ENDOSCOPIST: Inda Castle, MD REFERRED [id number] Karsten Ro, M.D.  Cathlean Cower, M.D. PROCEDURE DATE:  09/08/2012 PROCEDURE:   Colonoscopy with snare polypectomy ASA CLASS:   Class II INDICATIONS:heme-positive stool. MEDICATIONS: MAC sedation, administered by CRNA and propofol (Diprivan) 300mg  IV  DESCRIPTION OF PROCEDURE:   After the risks benefits and alternatives of the procedure were thoroughly explained, informed consent was obtained.  A digital rectal exam revealed no abnormalities of the rectum.   The LB [id number]  endoscope was introduced through the anus and advanced to the cecum, which was identified by the ileocecal valve. No adverse events experienced.   The quality of the prep was Suprep good  The instrument was then slowly withdrawn as the colon was fully examined.      COLON FINDINGS: A flat polyp measuring 3 mm in size was found at the cecum.  A polypectomy was performed with a cold snare.  The resection was complete and the polyp tissue was not retrieved.   A sessile polyp measuring 3 mm in size was found in the descending colon.  A polypectomy was performed with a cold snare.  The resection was complete and the polyp tissue was completely retrieved.   Moderate diverticulosis was noted in the sigmoid colon and descending colon.   Internal hemorrhoids were found.   The colon mucosa was otherwise normal.  Retroflexed views revealed no abnormalities. The time to cecum=5 minutes 25 seconds.  Withdrawal time=13 minutes 53 seconds.  The scope was withdrawn and the procedure completed. COMPLICATIONS: There were no complications.  ENDOSCOPIC IMPRESSION: 1.   Flat polyp measuring 3 mm in size was found at the cecum; polypectomy was performed with a cold snare 2.    Sessile polyp measuring 3 mm in size was found in the descending colon; polypectomy was performed with a cold snare 3.   Moderate diverticulosis was noted in the sigmoid colon and descending colon 4.   Internal hemorrhoids 5.   The colon mucosa was otherwise normal  Heme positive stool possible secondary  to hemorrhoids  RECOMMENDATIONS: If the polyp(s) removed today are proven to be adenomatous (pre-cancerous) polyps, you will need a repeat colonoscopy in 5 years.  Otherwise you should continue to follow colorectal cancer screening guidelines for "routine risk" patients with colonoscopy in 10 years.  You will receive a letter within 1-2 weeks with the results of your biopsy as well as final recommendations.  Please call my office if you have not received a letter after 3 weeks. Fiber supplementation OV 6 weeks Hemeoccults in 2 weeks   eSigned:  Inda Castle, MD 09/08/2012 2:42 PM   cc:   PATIENT NAME:  [name], [name] MR#: [id number]

## 2012-09-08 NOTE — Progress Notes (Signed)
Patient did not experience any of the following events: a burn prior to discharge; a fall within the facility; wrong site/side/patient/procedure/implant event; or a hospital transfer or hospital admission upon discharge from the facility. (G8907) Patient did not have preoperative order for IV antibiotic SSI prophylaxis. (G8918)  

## 2012-09-09 ENCOUNTER — Telehealth: Payer: Self-pay | Admitting: *Deleted

## 2012-09-09 ENCOUNTER — Other Ambulatory Visit: Payer: Self-pay | Admitting: Gastroenterology

## 2012-09-09 DIAGNOSIS — T8389XA Other specified complication of genitourinary prosthetic devices, implants and grafts, initial encounter: Secondary | ICD-10-CM | POA: Diagnosis not present

## 2012-09-09 DIAGNOSIS — D649 Anemia, unspecified: Secondary | ICD-10-CM

## 2012-09-09 NOTE — Telephone Encounter (Signed)
  Follow up Call-  Call back number 09/08/2012  Post procedure Call Back phone  # [phone]  Permission to leave phone message Yes     Patient questions:  Do you have a fever, pain , or abdominal swelling? no Pain Score  0 *  Have you tolerated food without any problems? yes  Have you been able to return to your normal activities? yes  Do you have any questions about your discharge instructions: Diet   no Medications  no Follow up visit  no  Do you have questions or concerns about your Care? no  Actions: * If pain score is 4 or above: No action needed, pain <4.

## 2012-09-14 DIAGNOSIS — H40029 Open angle with borderline findings, high risk, unspecified eye: Secondary | ICD-10-CM | POA: Diagnosis not present

## 2012-09-14 DIAGNOSIS — H2510 Age-related nuclear cataract, unspecified eye: Secondary | ICD-10-CM | POA: Diagnosis not present

## 2012-09-15 ENCOUNTER — Encounter: Payer: Self-pay | Admitting: Gastroenterology

## 2012-10-12 ENCOUNTER — Telehealth: Payer: Self-pay | Admitting: Internal Medicine

## 2012-10-12 NOTE — Telephone Encounter (Signed)
The patient would like know where he could go to exercise that would be able to monitor what he does due to his health.

## 2012-10-12 NOTE — Telephone Encounter (Signed)
Patient would like to speak with assistant about an exercise program

## 2012-10-12 NOTE — Telephone Encounter (Signed)
I know of no specific local program for exercise in the setting of his illnesses that would be specifically monitored as he suggests

## 2012-10-13 NOTE — Telephone Encounter (Signed)
Patient informed. 

## 2012-10-19 ENCOUNTER — Ambulatory Visit: Payer: Medicare Other | Admitting: Internal Medicine

## 2012-10-20 ENCOUNTER — Encounter: Payer: Self-pay | Admitting: Internal Medicine

## 2012-10-20 ENCOUNTER — Ambulatory Visit (INDEPENDENT_AMBULATORY_CARE_PROVIDER_SITE_OTHER): Payer: Medicare Other | Admitting: Internal Medicine

## 2012-10-20 VITALS — BP 152/78 | HR 75 | Temp 97.7°F | Ht 74.0 in | Wt 266.2 lb

## 2012-10-20 DIAGNOSIS — R251 Tremor, unspecified: Secondary | ICD-10-CM | POA: Insufficient documentation

## 2012-10-20 DIAGNOSIS — J449 Chronic obstructive pulmonary disease, unspecified: Secondary | ICD-10-CM | POA: Diagnosis not present

## 2012-10-20 DIAGNOSIS — R197 Diarrhea, unspecified: Secondary | ICD-10-CM

## 2012-10-20 DIAGNOSIS — F411 Generalized anxiety disorder: Secondary | ICD-10-CM

## 2012-10-20 DIAGNOSIS — R259 Unspecified abnormal involuntary movements: Secondary | ICD-10-CM | POA: Diagnosis not present

## 2012-10-20 DIAGNOSIS — E119 Type 2 diabetes mellitus without complications: Secondary | ICD-10-CM

## 2012-10-20 MED ORDER — AZITHROMYCIN 250 MG PO TABS: ORAL_TABLET | ORAL | Status: DC

## 2012-10-20 MED ORDER — DIPHENOXYLATE-ATROPINE 2.5-0.025 MG PO TABS: 1.0000 | ORAL_TABLET | Freq: Four times a day (QID) | ORAL | Status: DC | PRN

## 2012-10-20 MED ORDER — METOPROLOL SUCCINATE ER 25 MG PO TB24: 25.0000 mg | ORAL_TABLET | Freq: Every day | ORAL | Status: DC

## 2012-10-20 NOTE — Assessment & Plan Note (Signed)
Lab Results  Component Value Date   HGBA1C 6.7* 08/27/2009   stable overall by history and exam, recent data reviewed with pt, and pt to continue medical treatment as before,  to f/u any worsening symptoms or concerns

## 2012-10-20 NOTE — Progress Notes (Signed)
Subjective:    Patient ID: [name], male    DOB: 02/08/1946, 67 y.o.   MRN: [id number]  HPI  Here with 3-4 days onset recurring loose stools/watery diarrhea mix coincident just after eating at local restaurant the night before, including rice at a buffet.   Has hx of bact colitis several yrs ago requiring hospn with IV antibx, and is very concerned today, requires lots of reassurance.  Denies fever, n/v, abd pain, blood in stools or other, chills, rash, dizziness or orthostasis.  Pt denies chest pain, increased sob or doe, wheezing, orthopnea, PND, increased LE swelling, palpitations, dizziness or syncope.   Last colonsocopy feb 2014 on chart, no hx of IBD .  Also mentions 6 mo worsening onset primarily RUE> LUE tremor noted worse in the AM especially with reading the newspaper which shakes too much to read.  No FH or personal hx, no other parkinson like symptoms such as loss of postural reflex or gait problem.  No recent falls.  Pt denies new neurological symptoms such as new headache, or facial or extremity weakness or numbness. States gets "keyed up" to come here, but Denies worsening depressive symptoms, suicidal ideation, or panic Past Medical History  Diagnosis Date  . COPD (chronic obstructive pulmonary disease)   . Hyperlipidemia   . Anxiety   . Diabetes mellitus type II     diet  . HTN (hypertension)   . LBP (low back pain)   . BPH (benign prostatic hyperplasia)   . Paresthesias     lower extremity  . Diverticulitis     colon  . History of prostate cancer   . Lumbar disc disease   . ETOHism     remission  . Nephrolithiasis   . ED (erectile dysfunction)   . UTI (lower urinary tract infection)   . Hx of colonic polyps   . Adenoma 05/2008  . Atrial fibrillation or flutter 01/02/2011  . Complex renal cyst 01/13/2011   Past Surgical History  Procedure Laterality Date  . Prostatectomy    . Penile prosthesis placement  09/2010  . US echocardiography  04/2012  . Penile  prosthesis implant  06/21/2012    Procedure: PENILE PROTHESIS INFLATABLE;  Surgeon: Claybon Jabs, MD;  Location: [facility];  Service: Urology;  Laterality: N/A;  REMOVAL AND REPLACEMENT OF SOME  OF PROSTHESIS (AMS)     reports that he has quit smoking. His smoking use included Cigarettes. He smoked 0.00 packs per day. He has never used smokeless tobacco. He reports that  drinks alcohol. He reports that he uses illicit drugs (Marijuana). family history includes Coronary artery disease in his father and mother; Diabetes in his father; and Prostate cancer in his father. Allergies  Allergen Reactions  . Ciprofloxacin Other (See Comments)    All over weakness   Current Outpatient Prescriptions on File Prior to Visit  Medication Sig Dispense Refill  . amiodarone (PACERONE) 200 MG tablet Take 200 mg by mouth daily.      Marland Kitchen aspirin 81 MG EC tablet Take 81 mg by mouth daily.        . benazepril (LOTENSIN) 40 MG tablet       . lactase (LACTAID) 3000 UNITS tablet Take 1 tablet by mouth 3 (three) times daily as needed.        Marland Kitchen omeprazole (PRILOSEC) 20 MG capsule TAKE ONE CAPSULE BY MOUTH EVERY DAY AS NEEDED  30 capsule  0  . potassium chloride (KLOR-CON) 20 MEQ packet  Take 20 mEq by mouth daily.      . pravastatin (PRAVACHOL) 40 MG tablet Take 40 mg by mouth daily.        Marland Kitchen albuterol (PROVENTIL HFA) 108 (90 BASE) MCG/ACT inhaler Inhale 2 puffs into the lungs every 12 (twelve) hours.  3 Inhaler  3  . furosemide (LASIX) 40 MG tablet Take 40 mg by mouth daily. Alternate 1/2 to 1 tablet daily      . HYDROcodone-acetaminophen (NORCO) 5-325 MG per tablet Take 1-2 tablets by mouth 2 (two) times daily as needed.  60 tablet  2  . ibuprofen (ADVIL,MOTRIN) 200 MG tablet Take 200 mg by mouth as directed.         No current facility-administered medications on file prior to visit.    Review of Systems  Constitutional: Negative for unexpected weight change, or unusual diaphoresis  HENT: Negative  for tinnitus.   Eyes: Negative for photophobia and visual disturbance.  Respiratory: Negative for choking and stridor.   Gastrointestinal: Negative for vomiting and blood in stool.  Genitourinary: Negative for hematuria and decreased urine volume.  Musculoskeletal: Negative for acute joint swelling Skin: Negative for color change and wound.  Neurological: Negative for tremors and numbness other than noted  Psychiatric/Behavioral: Negative for decreased concentration or  hyperactivity.       Objective:   Physical Exam BP 152/78  Pulse 75  Temp(Src) 97.7 F (36.5 C) (Oral)  Ht 6\' 2"  (1.88 m)  Wt 266 lb 4 oz (120.77 kg)  BMI 34.17 kg/m2  SpO2 99% VS noted, not ill appearing Constitutional: Pt appears well-developed and well-nourished.  HENT: Head: NCAT.  Right Ear: External ear normal.  Left Ear: External ear normal.  Eyes: Conjunctivae and EOM are normal. Pupils are equal, round, and reactive to light.  Neck: Normal range of motion. Neck supple.  Cardiovascular: Normal rate and regular rhythm.   Pulmonary/Chest: Effort normal and breath sounds normal.  Abd:  Soft, NT, non-distended, + BS, no guarding Neurological: Pt is alert. Not confused , mild tremor RUE noted, motor/dtr/gait intact Skin: Skin is warm. No erythema. No rash Psychiatric: Pt behavior is normal. Thought content normal. 1-2+ nervous    Assessment & Plan:

## 2012-10-20 NOTE — Assessment & Plan Note (Signed)
prob essential,, for low dose beta blocker which may help in cardiac context as well, though he is at risk for polypharmacy, and pt states will f/u with Dr Frutoso Chase soon

## 2012-10-20 NOTE — Patient Instructions (Signed)
Please take all new medication as prescribed - the generic Lomotil for diarrhea as needed, as well as the "zpack" which is the recommended tx for Traveler's Diarrhea when you cannot take cipro Please take all new medication as prescribed - the low dose metoprolol ER 25 mg per day for the tremor, and please let Dr Rollene Fare know this at your next visit Please continue all other medications as before, and refills have been done if requested. Please have the pharmacy call with any other refills you may need. Your medication list should be correct as you leave today Please return as needed for any worsening symptoms such as fever, abd pain, blood or worsening diarrhea

## 2012-10-20 NOTE — Assessment & Plan Note (Signed)
stable overall by history and exam, recent data reviewed with pt, and pt to continue medical treatment as before,  to f/u any worsening symptoms or concerns SpO2 Readings from Last 3 Encounters:  10/20/12 99%  09/08/12 96%  08/09/12 97%

## 2012-10-20 NOTE — Assessment & Plan Note (Signed)
Suspect viral vs food related; for septra ds x 3 days, more fluids, tylenol prn, lomotil prn ok given benign exam, doubt bact colitiis such as enterococcal or c diff

## 2012-10-20 NOTE — Assessment & Plan Note (Signed)
stable overall by history and exam, recent data reviewed with pt, and pt to continue medical treatment as before,  to f/u any worsening symptoms or concerns Lab Results  Component Value Date   WBC 5.4 08/09/2012   HGB 13.4 08/09/2012   HCT 40.4 08/09/2012   PLT 225.0 08/09/2012   GLUCOSE 108* 08/09/2012   CHOL 141 08/27/2009   TRIG 39.0 08/27/2009   HDL 52.60 08/27/2009   LDLCALC 81 08/27/2009   ALT 21 08/09/2012   AST 26 08/09/2012   NA 137 08/09/2012   K 4.3 08/09/2012   CL 103 08/09/2012   CREATININE 1.4 08/09/2012   BUN 16 08/09/2012   CO2 28 08/09/2012   TSH 0.38 08/27/2009   PSA 0.01* 08/08/2008   INR 1.63* 08/26/2010   HGBA1C 6.7* 08/27/2009   MICROALBUR 1.4 08/27/2009

## 2012-10-21 ENCOUNTER — Telehealth: Payer: Self-pay | Admitting: Gastroenterology

## 2012-10-21 NOTE — Telephone Encounter (Signed)
Pt states he made a mistake and the letter he received was from his PCP. Pt will be here for his appt tomorrow.

## 2012-10-22 ENCOUNTER — Encounter: Payer: Self-pay | Admitting: Gastroenterology

## 2012-10-22 ENCOUNTER — Other Ambulatory Visit: Payer: Medicare Other

## 2012-10-22 ENCOUNTER — Other Ambulatory Visit: Payer: Self-pay

## 2012-10-22 ENCOUNTER — Ambulatory Visit (INDEPENDENT_AMBULATORY_CARE_PROVIDER_SITE_OTHER): Payer: Medicare Other | Admitting: Gastroenterology

## 2012-10-22 VITALS — BP 164/70 | HR 82 | Ht 72.0 in | Wt 268.0 lb

## 2012-10-22 DIAGNOSIS — R195 Other fecal abnormalities: Secondary | ICD-10-CM | POA: Diagnosis not present

## 2012-10-22 MED ORDER — METOPROLOL SUCCINATE ER 25 MG PO TB24: 25.0000 mg | ORAL_TABLET | Freq: Every day | ORAL | Status: DC

## 2012-10-22 NOTE — Progress Notes (Signed)
History of Present Illness:  [name] has returned following colonoscopy where a small adenomatous polyp was removed. He's had no overt bleeding. He had a diarrheal illness lasting for 5 days which has subsided. He has no other GI complaints.    Review of Systems: Pertinent positive and negative review of systems were noted in the above HPI section. All other review of systems were otherwise negative.    Current Medications, Allergies, Past Medical History, Past Surgical History, Family History and Social History were reviewed in [place] record  Vital signs were reviewed in today's medical record. Physical Exam: General: Well developed , well nourished, no acute distress Skin: anicteric Head: Normocephalic and atraumatic Eyes:  sclerae anicteric, EOMI Ears: Normal auditory acuity Mouth: No deformity or lesions Lungs: Clear throughout to auscultation Heart: Regular rate and rhythm; no murmurs, rubs or bruits Abdomen: Soft, non tender and non distended. No masses, hepatosplenomegaly or hernias noted. Normal Bowel sounds Rectal:deferred Musculoskeletal: Symmetrical with no gross deformities  Pulses:  Normal pulses noted Extremities: No clubbing, cyanosis, edema or deformities noted Neurological: Alert oriented x 4, grossly nonfocal Psychological:  Alert and cooperative. Normal mood and affect

## 2012-10-22 NOTE — Patient Instructions (Addendum)
You will go to the basement for a lab kit today Please return the IFOB kit back within 2 weeks

## 2012-10-22 NOTE — Assessment & Plan Note (Signed)
Followup colonoscopy 2019

## 2012-10-22 NOTE — Assessment & Plan Note (Signed)
Etiology so far has not been determined. Plan repeat

## 2012-11-03 DIAGNOSIS — Z87442 Personal history of urinary calculi: Secondary | ICD-10-CM | POA: Diagnosis not present

## 2012-11-03 DIAGNOSIS — K5730 Diverticulosis of large intestine without perforation or abscess without bleeding: Secondary | ICD-10-CM | POA: Diagnosis not present

## 2012-11-03 DIAGNOSIS — R1032 Left lower quadrant pain: Secondary | ICD-10-CM | POA: Diagnosis not present

## 2012-11-03 DIAGNOSIS — N281 Cyst of kidney, acquired: Secondary | ICD-10-CM | POA: Diagnosis not present

## 2012-11-03 DIAGNOSIS — K449 Diaphragmatic hernia without obstruction or gangrene: Secondary | ICD-10-CM | POA: Diagnosis not present

## 2012-11-09 ENCOUNTER — Other Ambulatory Visit (INDEPENDENT_AMBULATORY_CARE_PROVIDER_SITE_OTHER): Payer: Medicare Other

## 2012-11-09 DIAGNOSIS — R195 Other fecal abnormalities: Secondary | ICD-10-CM

## 2012-11-09 LAB — FECAL OCCULT BLOOD, IMMUNOCHEMICAL: Fecal Occult Bld: NEGATIVE

## 2012-11-12 NOTE — Progress Notes (Signed)
Quick Note:  Please inform the patient that hemeoccult was normal and to continue current plan of action ______

## 2012-11-23 DIAGNOSIS — N281 Cyst of kidney, acquired: Secondary | ICD-10-CM | POA: Diagnosis not present

## 2012-11-23 DIAGNOSIS — R5381 Other malaise: Secondary | ICD-10-CM | POA: Diagnosis not present

## 2012-11-23 DIAGNOSIS — Z79899 Other long term (current) drug therapy: Secondary | ICD-10-CM | POA: Diagnosis not present

## 2012-11-23 DIAGNOSIS — I495 Sick sinus syndrome: Secondary | ICD-10-CM | POA: Diagnosis not present

## 2012-11-23 DIAGNOSIS — C61 Malignant neoplasm of prostate: Secondary | ICD-10-CM | POA: Diagnosis not present

## 2012-11-23 DIAGNOSIS — I4891 Unspecified atrial fibrillation: Secondary | ICD-10-CM | POA: Diagnosis not present

## 2012-11-23 DIAGNOSIS — R5383 Other fatigue: Secondary | ICD-10-CM | POA: Diagnosis not present

## 2012-11-23 DIAGNOSIS — J449 Chronic obstructive pulmonary disease, unspecified: Secondary | ICD-10-CM | POA: Diagnosis not present

## 2012-12-27 ENCOUNTER — Other Ambulatory Visit: Payer: Self-pay | Admitting: Internal Medicine

## 2013-03-14 ENCOUNTER — Encounter: Payer: Self-pay | Admitting: Internal Medicine

## 2013-03-14 ENCOUNTER — Ambulatory Visit (INDEPENDENT_AMBULATORY_CARE_PROVIDER_SITE_OTHER): Payer: Medicare Other | Admitting: Internal Medicine

## 2013-03-14 VITALS — BP 162/70 | HR 57 | Temp 98.3°F | Ht 73.0 in | Wt 281.5 lb

## 2013-03-14 DIAGNOSIS — M549 Dorsalgia, unspecified: Secondary | ICD-10-CM | POA: Diagnosis not present

## 2013-03-14 DIAGNOSIS — I10 Essential (primary) hypertension: Secondary | ICD-10-CM | POA: Diagnosis not present

## 2013-03-14 DIAGNOSIS — E119 Type 2 diabetes mellitus without complications: Secondary | ICD-10-CM

## 2013-03-14 MED ORDER — HYDROCODONE-ACETAMINOPHEN 5-325 MG PO TABS: 1.0000 | ORAL_TABLET | Freq: Two times a day (BID) | ORAL | Status: DC | PRN

## 2013-03-14 NOTE — Progress Notes (Signed)
Subjective:    Patient ID: [name], male    DOB: Dec 22, 1945, 67 y.o.   MRN: [id number]  HPI  Here with acute onset 2 wks persistent right  LBP without change in severity, bowel or bladder change, fever, wt loss,  worsening LE pain/numbness/weakness, gait change or falls. Advil no help this time. Last narcotic use 2 yrs ago Has hx of recurring back pain, has appt incidentally with spine specialist next wk.  Saw urology about 3 mo ago, gets PSA there, twice yearly, still 0 per pt. Also had neck and also mid back pain occaionally. Has overall over 2 inches in ht with spine dz. Past Medical History  Diagnosis Date  . COPD (chronic obstructive pulmonary disease)   . Hyperlipidemia   . Anxiety   . Diabetes mellitus type II     diet  . HTN (hypertension)   . LBP (low back pain)   . BPH (benign prostatic hyperplasia)   . Paresthesias     lower extremity  . Diverticulitis     colon  . History of prostate cancer   . Lumbar disc disease   . ETOHism     remission  . Nephrolithiasis   . ED (erectile dysfunction)   . UTI (lower urinary tract infection)   . Hx of colonic polyps   . Adenoma 05/2008  . Atrial fibrillation or flutter 01/02/2011  . Complex renal cyst 01/13/2011   Past Surgical History  Procedure Laterality Date  . Prostatectomy    . Penile prosthesis placement  09/2010  . US echocardiography  04/2012  . Penile prosthesis implant  06/21/2012    Procedure: PENILE PROTHESIS INFLATABLE;  Surgeon: Claybon Jabs, MD;  Location: [facility];  Service: Urology;  Laterality: N/A;  REMOVAL AND REPLACEMENT OF SOME  OF PROSTHESIS (AMS)     reports that he has quit smoking. His smoking use included Cigarettes. He smoked 0.00 packs per day. He has never used smokeless tobacco. He reports that  drinks alcohol. He reports that he uses illicit drugs (Marijuana). family history includes Coronary artery disease in his father and mother; Diabetes in his father; Prostate  cancer in his father. Allergies  Allergen Reactions  . Ciprofloxacin Other (See Comments)    All over weakness   Current Outpatient Prescriptions on File Prior to Visit  Medication Sig Dispense Refill  . albuterol (PROVENTIL HFA) 108 (90 BASE) MCG/ACT inhaler Inhale 2 puffs into the lungs every 12 (twelve) hours.  3 Inhaler  3  . amiodarone (PACERONE) 200 MG tablet Take 200 mg by mouth daily.      Marland Kitchen amLODipine (NORVASC) 10 MG tablet Take 10 mg by mouth daily.      Marland Kitchen aspirin 81 MG EC tablet Take 81 mg by mouth daily.        . benazepril (LOTENSIN) 40 MG tablet       . ibuprofen (ADVIL,MOTRIN) 200 MG tablet Take 200 mg by mouth as directed.        . lactase (LACTAID) 3000 UNITS tablet Take 1 tablet by mouth 3 (three) times daily as needed.        . metoprolol succinate (TOPROL XL) 25 MG 24 hr tablet Take 1 tablet (25 mg total) by mouth daily.  90 tablet  3  . omeprazole (PRILOSEC) 20 MG capsule TAKE ONE CAPSULE BY MOUTH EVERY DAY AS NEEDED  30 capsule  0  . potassium chloride (KLOR-CON) 20 MEQ packet Take 20 mEq by mouth daily.      Marland Kitchen  pravastatin (PRAVACHOL) 40 MG tablet Take 40 mg by mouth daily.        Marland Kitchen PROVENTIL HFA 108 (90 BASE) MCG/ACT inhaler INHALE 2 PUFFS EVERY TWELVE HOURS  6.7 each  2  . furosemide (LASIX) 40 MG tablet Take 40 mg by mouth daily. Alternate 1/2 to 1 tablet daily       No current facility-administered medications on file prior to visit.   Review of Systems  Constitutional: Negative for unexpected weight change, or unusual diaphoresis  HENT: Negative for tinnitus.   Eyes: Negative for photophobia and visual disturbance.  Respiratory: Negative for choking and stridor.   Gastrointestinal: Negative for vomiting and blood in stool.  Genitourinary: Negative for hematuria and decreased urine volume.  Musculoskeletal: Negative for acute joint swelling Skin: Negative for color change and wound.  Neurological: Negative for tremors and numbness other than noted   Psychiatric/Behavioral: Negative for decreased concentration or  hyperactivity.       Objective:   Physical Exam BP 162/70  Pulse 57  Temp(Src) 98.3 F (36.8 C) (Oral)  Ht 6\' 1"  (1.854 m)  Wt 281 lb 8 oz (127.688 kg)  BMI 37.15 kg/m2  SpO2 96% VS noted,  Constitutional: Pt appears well-developed and well-nourished.  HENT: Head: NCAT.  Right Ear: External ear normal.  Left Ear: External ear normal.  Eyes: Conjunctivae and EOM are normal. Pupils are equal, round, and reactive to light.  Neck: Normal range of motion. Neck supple.  Cardiovascular: Normal rate and regular rhythm.   Pulmonary/Chest: Effort normal and breath sounds normal.  Abd:  Soft, NT, non-distended, + BS Neurological: Pt is alert. Not confused , spine nontender, no butock or leg tender or swelling, motor 5/5, gait intact Skin: Skin is warm. No erythema.  Psychiatric: Pt behavior is normal. Thought content normal.     Assessment & Plan:

## 2013-03-14 NOTE — Assessment & Plan Note (Signed)
With recent right flare, suspect likely underlying djd or ddd lumbar related, for pain control, f/u surgury next wk as planned

## 2013-03-14 NOTE — Assessment & Plan Note (Signed)
stable overall by history and exam, recent data reviewed with pt, and pt to continue medical treatment as before,  to f/u any worsening symptoms or concerns Lab Results  Component Value Date   HGBA1C 6.7* 08/27/2009   Declines predpack today for back pain,  to f/u any worsening symptoms or concerns

## 2013-03-14 NOTE — Patient Instructions (Signed)
Please take all new medication as prescribed Please continue all other medications as before, and refills have been done if requested. Please have the pharmacy call with any other refills you may need. Please keep your appointments with your specialists as you have planned - urology and the spine specialist as you have planned  Please remember to sign up for My Chart if you have not done so, as this will be important to you in the future with finding out test results, communicating by private email, and scheduling acute appointments online when needed.  Please return in 6 months, or sooner if needed

## 2013-03-14 NOTE — Assessment & Plan Note (Addendum)
Elevated today likely situational, stable overall by history and exam, recent data reviewed with pt, and pt to continue medical treatment as before,  to f/u any worsening symptoms or concerns BP Readings from Last 3 Encounters:  03/14/13 162/70  10/22/12 164/70  10/20/12 152/78

## 2013-03-22 ENCOUNTER — Other Ambulatory Visit: Payer: Self-pay

## 2013-03-22 MED ORDER — PRAVASTATIN SODIUM 40 MG PO TABS: 40.0000 mg | ORAL_TABLET | Freq: Every day | ORAL | Status: DC

## 2013-03-22 MED ORDER — OMEPRAZOLE 20 MG PO CPDR: 20.0000 mg | DELAYED_RELEASE_CAPSULE | Freq: Every day | ORAL | Status: DC

## 2013-03-22 NOTE — Telephone Encounter (Signed)
Rx was sent to pharmacy electronically via [employer].

## 2013-03-23 DIAGNOSIS — M545 Low back pain, unspecified: Secondary | ICD-10-CM | POA: Diagnosis not present

## 2013-03-23 DIAGNOSIS — M47817 Spondylosis without myelopathy or radiculopathy, lumbosacral region: Secondary | ICD-10-CM | POA: Diagnosis not present

## 2013-03-23 DIAGNOSIS — M47812 Spondylosis without myelopathy or radiculopathy, cervical region: Secondary | ICD-10-CM | POA: Diagnosis not present

## 2013-03-23 DIAGNOSIS — M5137 Other intervertebral disc degeneration, lumbosacral region: Secondary | ICD-10-CM | POA: Diagnosis not present

## 2013-03-28 DIAGNOSIS — M47817 Spondylosis without myelopathy or radiculopathy, lumbosacral region: Secondary | ICD-10-CM | POA: Diagnosis not present

## 2013-03-28 DIAGNOSIS — M25559 Pain in unspecified hip: Secondary | ICD-10-CM | POA: Diagnosis not present

## 2013-03-29 ENCOUNTER — Other Ambulatory Visit: Payer: Self-pay | Admitting: Rehabilitation

## 2013-03-29 DIAGNOSIS — M545 Low back pain, unspecified: Secondary | ICD-10-CM

## 2013-03-29 DIAGNOSIS — M47817 Spondylosis without myelopathy or radiculopathy, lumbosacral region: Secondary | ICD-10-CM | POA: Diagnosis not present

## 2013-03-29 DIAGNOSIS — M479 Spondylosis, unspecified: Secondary | ICD-10-CM

## 2013-03-29 DIAGNOSIS — M25559 Pain in unspecified hip: Secondary | ICD-10-CM | POA: Diagnosis not present

## 2013-03-31 DIAGNOSIS — M47817 Spondylosis without myelopathy or radiculopathy, lumbosacral region: Secondary | ICD-10-CM | POA: Diagnosis not present

## 2013-03-31 DIAGNOSIS — M25559 Pain in unspecified hip: Secondary | ICD-10-CM | POA: Diagnosis not present

## 2013-04-04 DIAGNOSIS — M25559 Pain in unspecified hip: Secondary | ICD-10-CM | POA: Diagnosis not present

## 2013-04-04 DIAGNOSIS — M47817 Spondylosis without myelopathy or radiculopathy, lumbosacral region: Secondary | ICD-10-CM | POA: Diagnosis not present

## 2013-04-06 DIAGNOSIS — M47817 Spondylosis without myelopathy or radiculopathy, lumbosacral region: Secondary | ICD-10-CM | POA: Diagnosis not present

## 2013-04-06 DIAGNOSIS — M25559 Pain in unspecified hip: Secondary | ICD-10-CM | POA: Diagnosis not present

## 2013-04-08 ENCOUNTER — Ambulatory Visit
Admission: RE | Admit: 2013-04-08 | Discharge: 2013-04-08 | Disposition: A | Discharge status: Home / Self Care | Payer: Medicare Other | Source: Ambulatory Visit | Attending: Rehabilitation | Admitting: Rehabilitation

## 2013-04-08 DIAGNOSIS — M545 Low back pain, unspecified: Secondary | ICD-10-CM

## 2013-04-08 DIAGNOSIS — M5137 Other intervertebral disc degeneration, lumbosacral region: Secondary | ICD-10-CM | POA: Diagnosis not present

## 2013-04-08 DIAGNOSIS — M47817 Spondylosis without myelopathy or radiculopathy, lumbosacral region: Secondary | ICD-10-CM | POA: Diagnosis not present

## 2013-04-08 DIAGNOSIS — M48061 Spinal stenosis, lumbar region without neurogenic claudication: Secondary | ICD-10-CM | POA: Diagnosis not present

## 2013-04-08 DIAGNOSIS — M479 Spondylosis, unspecified: Secondary | ICD-10-CM

## 2013-04-08 DIAGNOSIS — M25559 Pain in unspecified hip: Secondary | ICD-10-CM | POA: Diagnosis not present

## 2013-04-11 DIAGNOSIS — M25559 Pain in unspecified hip: Secondary | ICD-10-CM | POA: Diagnosis not present

## 2013-04-11 DIAGNOSIS — M47817 Spondylosis without myelopathy or radiculopathy, lumbosacral region: Secondary | ICD-10-CM | POA: Diagnosis not present

## 2013-04-13 DIAGNOSIS — M25559 Pain in unspecified hip: Secondary | ICD-10-CM | POA: Diagnosis not present

## 2013-04-13 DIAGNOSIS — M47817 Spondylosis without myelopathy or radiculopathy, lumbosacral region: Secondary | ICD-10-CM | POA: Diagnosis not present

## 2013-04-15 DIAGNOSIS — M25559 Pain in unspecified hip: Secondary | ICD-10-CM | POA: Diagnosis not present

## 2013-04-15 DIAGNOSIS — M47817 Spondylosis without myelopathy or radiculopathy, lumbosacral region: Secondary | ICD-10-CM | POA: Diagnosis not present

## 2013-04-18 DIAGNOSIS — I4891 Unspecified atrial fibrillation: Secondary | ICD-10-CM | POA: Diagnosis not present

## 2013-04-18 DIAGNOSIS — I495 Sick sinus syndrome: Secondary | ICD-10-CM | POA: Diagnosis not present

## 2013-04-18 DIAGNOSIS — M47817 Spondylosis without myelopathy or radiculopathy, lumbosacral region: Secondary | ICD-10-CM | POA: Diagnosis not present

## 2013-04-18 DIAGNOSIS — I10 Essential (primary) hypertension: Secondary | ICD-10-CM | POA: Diagnosis not present

## 2013-04-18 DIAGNOSIS — M25559 Pain in unspecified hip: Secondary | ICD-10-CM | POA: Diagnosis not present

## 2013-04-19 ENCOUNTER — Encounter: Payer: Self-pay | Admitting: Cardiovascular Disease

## 2013-04-20 DIAGNOSIS — M47817 Spondylosis without myelopathy or radiculopathy, lumbosacral region: Secondary | ICD-10-CM | POA: Diagnosis not present

## 2013-04-20 DIAGNOSIS — M545 Low back pain, unspecified: Secondary | ICD-10-CM | POA: Diagnosis not present

## 2013-04-20 DIAGNOSIS — M542 Cervicalgia: Secondary | ICD-10-CM | POA: Diagnosis not present

## 2013-04-20 DIAGNOSIS — M48061 Spinal stenosis, lumbar region without neurogenic claudication: Secondary | ICD-10-CM | POA: Diagnosis not present

## 2013-04-21 DIAGNOSIS — Z79899 Other long term (current) drug therapy: Secondary | ICD-10-CM | POA: Diagnosis not present

## 2013-04-21 DIAGNOSIS — M545 Low back pain, unspecified: Secondary | ICD-10-CM | POA: Diagnosis not present

## 2013-04-22 ENCOUNTER — Other Ambulatory Visit: Payer: Self-pay | Admitting: Cardiovascular Disease

## 2013-04-22 DIAGNOSIS — E782 Mixed hyperlipidemia: Secondary | ICD-10-CM | POA: Diagnosis not present

## 2013-04-22 DIAGNOSIS — R5381 Other malaise: Secondary | ICD-10-CM | POA: Diagnosis not present

## 2013-04-22 DIAGNOSIS — M47817 Spondylosis without myelopathy or radiculopathy, lumbosacral region: Secondary | ICD-10-CM | POA: Diagnosis not present

## 2013-04-22 DIAGNOSIS — M25559 Pain in unspecified hip: Secondary | ICD-10-CM | POA: Diagnosis not present

## 2013-04-22 DIAGNOSIS — R6889 Other general symptoms and signs: Secondary | ICD-10-CM | POA: Diagnosis not present

## 2013-04-22 DIAGNOSIS — R5383 Other fatigue: Secondary | ICD-10-CM | POA: Diagnosis not present

## 2013-04-22 LAB — CBC WITH DIFFERENTIAL/PLATELET
Basophils Absolute: 0 10*3/uL (ref 0.0–0.1)
Basophils Relative: 0 % (ref 0–1)
Eosinophils Absolute: 0.1 10*3/uL (ref 0.0–0.7)
Eosinophils Relative: 3 % (ref 0–5)
HCT: 39.5 % (ref 39.0–52.0)
Hemoglobin: 13.2 g/dL (ref 13.0–17.0)
Lymphocytes Relative: 43 % (ref 12–46)
Lymphs Abs: 2.2 10*3/uL (ref 0.7–4.0)
MCH: 28.1 pg (ref 26.0–34.0)
MCHC: 33.4 g/dL (ref 30.0–36.0)
MCV: 84 fL (ref 78.0–100.0)
Monocytes Absolute: 0.7 10*3/uL (ref 0.1–1.0)
Monocytes Relative: 13 % — ABNORMAL HIGH (ref 3–12)
Neutro Abs: 2.1 10*3/uL (ref 1.7–7.7)
Neutrophils Relative %: 41 % — ABNORMAL LOW (ref 43–77)
Platelets: 226 10*3/uL (ref 150–400)
RBC: 4.7 MIL/uL (ref 4.22–5.81)
RDW: 15.3 % (ref 11.5–15.5)
WBC: 5 10*3/uL (ref 4.0–10.5)

## 2013-04-22 LAB — LIPID PANEL
Cholesterol: 151 mg/dL (ref 0–200)
HDL: 39 mg/dL — ABNORMAL LOW (ref >39)
LDL Cholesterol: 101 mg/dL — ABNORMAL HIGH (ref 0–99)
Total CHOL/HDL Ratio: 3.9 Ratio
Triglycerides: 53 mg/dL (ref <150)
VLDL: 11 mg/dL (ref 0–40)

## 2013-04-22 LAB — COMPREHENSIVE METABOLIC PANEL
ALT: 20 U/L (ref 0–53)
AST: 27 U/L (ref 0–37)
Albumin: 3.7 g/dL (ref 3.5–5.2)
Alkaline Phosphatase: 100 U/L (ref 39–117)
BUN: 16 mg/dL (ref 6–23)
CO2: 25 mEq/L (ref 19–32)
Calcium: 9 mg/dL (ref 8.4–10.5)
Chloride: 103 mEq/L (ref 96–112)
Creat: 1.31 mg/dL (ref 0.50–1.35)
Glucose, Bld: 90 mg/dL (ref 70–99)
Potassium: 4.3 mEq/L (ref 3.5–5.3)
Sodium: 137 mEq/L (ref 135–145)
Total Bilirubin: 0.5 mg/dL (ref 0.3–1.2)
Total Protein: 7.8 g/dL (ref 6.0–8.3)

## 2013-04-22 LAB — TSH: TSH: 0.697 u[IU]/mL (ref 0.350–4.500)

## 2013-04-22 LAB — T4, FREE: Free T4: 1.47 ng/dL (ref 0.80–1.80)

## 2013-04-25 ENCOUNTER — Telehealth: Payer: Self-pay | Admitting: Cardiovascular Disease

## 2013-04-25 DIAGNOSIS — M25559 Pain in unspecified hip: Secondary | ICD-10-CM | POA: Diagnosis not present

## 2013-04-25 DIAGNOSIS — M47817 Spondylosis without myelopathy or radiculopathy, lumbosacral region: Secondary | ICD-10-CM | POA: Diagnosis not present

## 2013-04-25 NOTE — Telephone Encounter (Signed)
Pt. Wanted to talk about his medications.

## 2013-04-25 NOTE — Telephone Encounter (Signed)
Message sent to [facility]

## 2013-04-25 NOTE — Telephone Encounter (Signed)
Wants J C to call him about his medicine please.

## 2013-04-25 NOTE — Telephone Encounter (Signed)
Returning your call. °

## 2013-04-28 DIAGNOSIS — M25559 Pain in unspecified hip: Secondary | ICD-10-CM | POA: Diagnosis not present

## 2013-04-28 DIAGNOSIS — M47817 Spondylosis without myelopathy or radiculopathy, lumbosacral region: Secondary | ICD-10-CM | POA: Diagnosis not present

## 2013-04-29 DIAGNOSIS — M25559 Pain in unspecified hip: Secondary | ICD-10-CM | POA: Diagnosis not present

## 2013-04-29 DIAGNOSIS — M47817 Spondylosis without myelopathy or radiculopathy, lumbosacral region: Secondary | ICD-10-CM | POA: Diagnosis not present

## 2013-05-02 DIAGNOSIS — M47817 Spondylosis without myelopathy or radiculopathy, lumbosacral region: Secondary | ICD-10-CM | POA: Diagnosis not present

## 2013-05-02 DIAGNOSIS — M25559 Pain in unspecified hip: Secondary | ICD-10-CM | POA: Diagnosis not present

## 2013-05-04 DIAGNOSIS — M47817 Spondylosis without myelopathy or radiculopathy, lumbosacral region: Secondary | ICD-10-CM | POA: Diagnosis not present

## 2013-05-04 DIAGNOSIS — M25559 Pain in unspecified hip: Secondary | ICD-10-CM | POA: Diagnosis not present

## 2013-05-06 DIAGNOSIS — M25559 Pain in unspecified hip: Secondary | ICD-10-CM | POA: Diagnosis not present

## 2013-05-06 DIAGNOSIS — M47817 Spondylosis without myelopathy or radiculopathy, lumbosacral region: Secondary | ICD-10-CM | POA: Diagnosis not present

## 2013-05-10 DIAGNOSIS — M47817 Spondylosis without myelopathy or radiculopathy, lumbosacral region: Secondary | ICD-10-CM | POA: Diagnosis not present

## 2013-05-10 DIAGNOSIS — M25559 Pain in unspecified hip: Secondary | ICD-10-CM | POA: Diagnosis not present

## 2013-05-11 DIAGNOSIS — M47817 Spondylosis without myelopathy or radiculopathy, lumbosacral region: Secondary | ICD-10-CM | POA: Diagnosis not present

## 2013-05-11 DIAGNOSIS — M25559 Pain in unspecified hip: Secondary | ICD-10-CM | POA: Diagnosis not present

## 2013-05-17 DIAGNOSIS — M47817 Spondylosis without myelopathy or radiculopathy, lumbosacral region: Secondary | ICD-10-CM | POA: Diagnosis not present

## 2013-05-17 DIAGNOSIS — M25559 Pain in unspecified hip: Secondary | ICD-10-CM | POA: Diagnosis not present

## 2013-05-19 DIAGNOSIS — M47817 Spondylosis without myelopathy or radiculopathy, lumbosacral region: Secondary | ICD-10-CM | POA: Diagnosis not present

## 2013-05-19 DIAGNOSIS — M25559 Pain in unspecified hip: Secondary | ICD-10-CM | POA: Diagnosis not present

## 2013-05-24 DIAGNOSIS — M25559 Pain in unspecified hip: Secondary | ICD-10-CM | POA: Diagnosis not present

## 2013-05-24 DIAGNOSIS — M47817 Spondylosis without myelopathy or radiculopathy, lumbosacral region: Secondary | ICD-10-CM | POA: Diagnosis not present

## 2013-05-25 DIAGNOSIS — M48061 Spinal stenosis, lumbar region without neurogenic claudication: Secondary | ICD-10-CM | POA: Diagnosis not present

## 2013-05-25 DIAGNOSIS — M545 Low back pain, unspecified: Secondary | ICD-10-CM | POA: Diagnosis not present

## 2013-05-25 DIAGNOSIS — M5126 Other intervertebral disc displacement, lumbar region: Secondary | ICD-10-CM | POA: Diagnosis not present

## 2013-05-31 ENCOUNTER — Ambulatory Visit: Payer: Medicare Other

## 2013-05-31 DIAGNOSIS — M25559 Pain in unspecified hip: Secondary | ICD-10-CM | POA: Diagnosis not present

## 2013-05-31 DIAGNOSIS — M47817 Spondylosis without myelopathy or radiculopathy, lumbosacral region: Secondary | ICD-10-CM | POA: Diagnosis not present

## 2013-06-01 ENCOUNTER — Other Ambulatory Visit: Payer: Self-pay | Admitting: *Deleted

## 2013-06-01 ENCOUNTER — Ambulatory Visit (INDEPENDENT_AMBULATORY_CARE_PROVIDER_SITE_OTHER): Payer: Medicare Other

## 2013-06-01 DIAGNOSIS — Z23 Encounter for immunization: Secondary | ICD-10-CM | POA: Diagnosis not present

## 2013-06-01 MED ORDER — FUROSEMIDE 40 MG PO TABS: 40.0000 mg | ORAL_TABLET | Freq: Every day | ORAL | Status: DC

## 2013-06-03 DIAGNOSIS — M47817 Spondylosis without myelopathy or radiculopathy, lumbosacral region: Secondary | ICD-10-CM | POA: Diagnosis not present

## 2013-06-03 DIAGNOSIS — M25559 Pain in unspecified hip: Secondary | ICD-10-CM | POA: Diagnosis not present

## 2013-06-09 DIAGNOSIS — M47817 Spondylosis without myelopathy or radiculopathy, lumbosacral region: Secondary | ICD-10-CM | POA: Diagnosis not present

## 2013-06-09 DIAGNOSIS — M25559 Pain in unspecified hip: Secondary | ICD-10-CM | POA: Diagnosis not present

## 2013-06-22 ENCOUNTER — Other Ambulatory Visit: Payer: Self-pay | Admitting: *Deleted

## 2013-06-22 MED ORDER — POTASSIUM CHLORIDE 20 MEQ PO PACK: 20.0000 meq | PACK | Freq: Every day | ORAL | Status: DC

## 2013-06-22 MED ORDER — AMIODARONE HCL 200 MG PO TABS: 200.0000 mg | ORAL_TABLET | Freq: Every day | ORAL | Status: DC

## 2013-06-22 NOTE — Telephone Encounter (Signed)
Rx was sent to pharmacy electronically. 

## 2013-06-28 ENCOUNTER — Telehealth: Payer: Self-pay | Admitting: Cardiovascular Disease

## 2013-06-28 ENCOUNTER — Other Ambulatory Visit: Payer: Self-pay | Admitting: *Deleted

## 2013-06-28 NOTE — Telephone Encounter (Signed)
Would like to speak to [name] about his medication , according to his pharmacist he needs to speak with someone about his medication regiment .Marland Kitchen Please call    Thanks

## 2013-06-28 NOTE — Telephone Encounter (Signed)
i called [facility].  And the pharmacist told me she would take care of the issue between the amiodarone and metoprolol. Also  Switched this pt. Back to capsules instead of packets on his klor con

## 2013-06-28 NOTE — Telephone Encounter (Signed)
Sent to [place] to advise

## 2013-07-22 ENCOUNTER — Telehealth: Payer: Self-pay | Admitting: *Deleted

## 2013-07-22 NOTE — Telephone Encounter (Signed)
Pt. Wanted to be put with Dr.[name].

## 2013-07-22 NOTE — Telephone Encounter (Signed)
Pt was calling to speak to you about transferring care to another provider. He would like to talk to you.

## 2013-08-24 ENCOUNTER — Telehealth: Payer: Self-pay | Admitting: Cardiovascular Disease

## 2013-08-24 NOTE — Telephone Encounter (Signed)
Please have J C to call him. He was suppose to have been getting back to himabout what doctor would be best for his condition.

## 2013-08-24 NOTE — Telephone Encounter (Signed)
Returned call and pt verified x 2.  Pt stated JC was supposed to call him back about who his new heart doctor would be and he just wanted to f/u to find out who it was.  Pt informed, per last phone note, that [place] mentioned Dr. Ellyn Hack and an appt recall was placed for Dr. Ellyn Hack.  Pt stated if that is what he put, then he will schedule the appt.  Appt scheduled w/ Dr. Ellyn Hack on 3.26.15 at 9:30am (RW>>DH).    Pt would like to discuss Dr. Ellyn Hack w/ [place] and informed JC will be notified.  Pt verbalized understanding and agreed w/ plan.  Message forwarded to [facility]. Tressie Stalker, LPN.

## 2013-08-25 DIAGNOSIS — M47817 Spondylosis without myelopathy or radiculopathy, lumbosacral region: Secondary | ICD-10-CM | POA: Diagnosis not present

## 2013-08-25 DIAGNOSIS — M545 Low back pain, unspecified: Secondary | ICD-10-CM | POA: Diagnosis not present

## 2013-08-25 DIAGNOSIS — M542 Cervicalgia: Secondary | ICD-10-CM | POA: Diagnosis not present

## 2013-08-25 DIAGNOSIS — M47812 Spondylosis without myelopathy or radiculopathy, cervical region: Secondary | ICD-10-CM | POA: Diagnosis not present

## 2013-08-26 NOTE — Telephone Encounter (Signed)
TALKED TO PT. ABOUT HIS CONCERNS

## 2013-09-29 ENCOUNTER — Ambulatory Visit: Payer: Medicare Other | Admitting: Internal Medicine

## 2013-10-06 ENCOUNTER — Ambulatory Visit: Payer: Medicare Other | Admitting: Internal Medicine

## 2013-10-07 ENCOUNTER — Ambulatory Visit: Payer: Medicare Other | Admitting: Internal Medicine

## 2013-10-12 DIAGNOSIS — M545 Low back pain, unspecified: Secondary | ICD-10-CM | POA: Diagnosis not present

## 2013-10-12 DIAGNOSIS — M47812 Spondylosis without myelopathy or radiculopathy, cervical region: Secondary | ICD-10-CM | POA: Diagnosis not present

## 2013-10-12 DIAGNOSIS — M542 Cervicalgia: Secondary | ICD-10-CM | POA: Diagnosis not present

## 2013-10-13 ENCOUNTER — Ambulatory Visit: Payer: Medicare Other | Admitting: Cardiology

## 2013-10-13 ENCOUNTER — Other Ambulatory Visit: Payer: Self-pay | Admitting: Rehabilitation

## 2013-10-13 DIAGNOSIS — M545 Low back pain, unspecified: Secondary | ICD-10-CM | POA: Diagnosis not present

## 2013-10-13 DIAGNOSIS — Z79899 Other long term (current) drug therapy: Secondary | ICD-10-CM | POA: Diagnosis not present

## 2013-10-13 DIAGNOSIS — M542 Cervicalgia: Secondary | ICD-10-CM

## 2013-10-21 ENCOUNTER — Ambulatory Visit
Admission: RE | Admit: 2013-10-21 | Discharge: 2013-10-21 | Disposition: A | Discharge status: Home / Self Care | Payer: Medicare Other | Source: Ambulatory Visit | Attending: Rehabilitation | Admitting: Rehabilitation

## 2013-10-21 DIAGNOSIS — M47812 Spondylosis without myelopathy or radiculopathy, cervical region: Secondary | ICD-10-CM | POA: Diagnosis not present

## 2013-10-21 DIAGNOSIS — M542 Cervicalgia: Secondary | ICD-10-CM

## 2013-10-21 DIAGNOSIS — M5020 Other cervical disc displacement, unspecified cervical region: Secondary | ICD-10-CM | POA: Diagnosis not present

## 2013-10-21 DIAGNOSIS — M4802 Spinal stenosis, cervical region: Secondary | ICD-10-CM | POA: Diagnosis not present

## 2013-10-27 DIAGNOSIS — M542 Cervicalgia: Secondary | ICD-10-CM | POA: Diagnosis not present

## 2013-10-27 DIAGNOSIS — M4802 Spinal stenosis, cervical region: Secondary | ICD-10-CM | POA: Diagnosis not present

## 2013-10-27 DIAGNOSIS — M47812 Spondylosis without myelopathy or radiculopathy, cervical region: Secondary | ICD-10-CM | POA: Diagnosis not present

## 2013-11-01 ENCOUNTER — Encounter: Payer: Self-pay | Admitting: *Deleted

## 2013-11-03 ENCOUNTER — Encounter: Payer: Self-pay | Admitting: Cardiology

## 2013-11-03 ENCOUNTER — Ambulatory Visit (INDEPENDENT_AMBULATORY_CARE_PROVIDER_SITE_OTHER): Payer: Medicare Other | Admitting: Cardiology

## 2013-11-03 VITALS — BP 142/80 | HR 61 | Ht 74.0 in | Wt 208.5 lb

## 2013-11-03 DIAGNOSIS — R259 Unspecified abnormal involuntary movements: Secondary | ICD-10-CM

## 2013-11-03 DIAGNOSIS — E782 Mixed hyperlipidemia: Secondary | ICD-10-CM

## 2013-11-03 DIAGNOSIS — Z79899 Other long term (current) drug therapy: Secondary | ICD-10-CM | POA: Diagnosis not present

## 2013-11-03 DIAGNOSIS — I480 Paroxysmal atrial fibrillation: Secondary | ICD-10-CM

## 2013-11-03 DIAGNOSIS — R251 Tremor, unspecified: Secondary | ICD-10-CM

## 2013-11-03 DIAGNOSIS — I10 Essential (primary) hypertension: Secondary | ICD-10-CM

## 2013-11-03 DIAGNOSIS — I517 Cardiomegaly: Secondary | ICD-10-CM

## 2013-11-03 DIAGNOSIS — I4891 Unspecified atrial fibrillation: Secondary | ICD-10-CM | POA: Diagnosis not present

## 2013-11-03 DIAGNOSIS — E785 Hyperlipidemia, unspecified: Secondary | ICD-10-CM

## 2013-11-03 NOTE — Patient Instructions (Signed)
LABS LIPID ,TSH T4,FREE T3, CMP  Your physician wants you to follow-up in  [place].You will receive a reminder letter in the mail two months in advance. If you don't receive a letter, please call our office to schedule the follow-up appointment.

## 2013-11-06 ENCOUNTER — Encounter: Payer: Self-pay | Admitting: Cardiology

## 2013-11-06 DIAGNOSIS — Z79899 Other long term (current) drug therapy: Secondary | ICD-10-CM | POA: Insufficient documentation

## 2013-11-06 NOTE — Assessment & Plan Note (Addendum)
This is helped by low-dose beta blocker. On fine if he would like to take alternating doses of 25 and 12-1/2 of the Toprol. If his tremors get worse he can take a higher dose will then refill the prescription for him. He because of the higher dose as routine, he feels more fatigued, and has had more notable bradycardia.

## 2013-11-06 NOTE — Assessment & Plan Note (Signed)
At this point not really sure what the history of this was. It was first diagnosed in 2012, but he has been seeing Dr. Rollene Fare for quite a long time therefore may not be that needed. He is on amiodarone and low-dose beta blocker, but no anticoagulation. As far as I can tell he is at no recent recurrences of A. Fib.  His EKG is quite unusual with quite frequent PACs and an irregular overall pattern with varying degree of compensatory pause after each beat. He is asymptomatic and I am inclined just to continue with his medication for now to know him better.  In the future, it would be nice to see how he does off of amiodarone.

## 2013-11-06 NOTE — Assessment & Plan Note (Signed)
Relatively stable today. His PCP is also adjusting his medications. For now we'll leave regimen as is. Would not titrate beta blocker up due to fatigue

## 2013-11-06 NOTE — Progress Notes (Signed)
PATIENTMr. [name] MRN: [id number] DOB: [birth year] PCP: Parker Cower, MD  Clinic Note: Chief Complaint  Patient presents with  . 7 month visit    no chest pain , sob , right ankle area edema, back and neck pian seeing a specialist    HPI: [name] is a 68 y.o. male with a PMH below who presents today for the establishment of new cardiology care and to return to [name]. He was last seen by [name] in September 2014 to followup his PAF. He has been on amiodarone for several years now for rhythm control. Unfortunately I'm not sure what based on evaluation was done on him. It would appear that his thyroid levels have been checked but I don't know about pulmonary function tests.  Interval History: He presents today really no major cardiac complaints. The only thing the mass into the cardia lesser system is right ankle edema which is probably more of a musculoskeletal issue. He denies any recurrence of any rapid or irregular heartbeats. No chest tightness or pressure with rest or exertion. No exertional dyspnea or resting dyspnea. Only the one-sided edema not bilateral edema but no PND or orthopnea. Does have some mild dizziness is probably more related to his neck discomfort. No syncope / near-syncope, or TIA /amaurosis fugax symptoms. No melena hematochezia or hematuria. No epistaxis no claudication.  He does note that he has tremors that come and go. The higher dose beta blocker is more beneficial for tremors, but at that dose he feels more tired and lethargic. He says usually when he takes his furosemide as standing dose, he really is not a problem the swelling. He does take an additional dose if need be for more notable swelling.   Past Medical History  Diagnosis Date  . PAF (paroxysmal atrial fibrillation)     Rhythm controlled on Amiodarone.  [facility] Anticoagulation  . Hypertension   . History of sick sinus syndrome     reduced BB dose for Bradycardia    . HLD (hyperlipidemia)   . Diabetes mellitus type 2, controlled   . COPD (chronic obstructive pulmonary disease)   . BPH (benign prostatic hyperplasia)   . History of prostate cancer     [name]  . ED (erectile dysfunction)     s/p Penile prosthesis (09/2010)  . Nephrolithiasis   . Diverticulitis of colon   . Colon polyps   . Complex renal cyst 12/2010  . Chronic LBP     Hip & Back -- Sees [name] @ [facility]  . Osteoarthritis of both hips   . Osteoarthritis of lumbosacral spine     with Disc Disease  . Paresthesias/numbness     Bilateral LE  . Alcoholism in recovery   . Adenoma 05/2008    Prior Cardiac Evaluation and Past Surgical History: Past Surgical History  Procedure Laterality Date  . Prostatectomy    . Penile prosthesis placement  09/2010  . US echocardiography  04/2012    EF of 55%; Mild LVH; no significant valve disease,  Trace MR; RVSP was minimally elevated at 33  . Penile prosthesis implant  06/21/2012    Procedure: PENILE PROTHESIS INFLATABLE;  Surgeon: Parker Jabs, MD;  Location: [facility];  Service: Urology;  Laterality: N/A;  REMOVAL AND REPLACEMENT OF SOME  OF PROSTHESIS (AMS)   . Nm myoview ltd  2010    No Ischemia or Infarct  . Cardiac catheterization  2003  Normal Coronary Arteries.    Allergies  Allergen Reactions  . Ciprofloxacin Other (See Comments)    All over weakness    Current Outpatient Prescriptions  Medication Sig Dispense Refill  . albuterol (PROVENTIL HFA) 108 (90 BASE) MCG/ACT inhaler Inhale 2 puffs into the lungs every 12 (twelve) hours.  3 Inhaler  3  . amiodarone (PACERONE) 200 MG tablet Take 1 tablet (200 mg total) by mouth daily.  90 tablet  2  . amLODipine (NORVASC) 10 MG tablet Take 10 mg by mouth daily.      Marland Kitchen aspirin 81 MG EC tablet Take 81 mg by mouth daily.        . benazepril (LOTENSIN) 40 MG tablet Take by mouth daily.       . furosemide (LASIX) 40 MG tablet Take 1 tablet (40 mg  total) by mouth daily. Alternate 1/2 to 1 tablet daily  135 tablet  3  . ibuprofen (ADVIL,MOTRIN) 200 MG tablet Take 200 mg by mouth as directed.        . lactase (LACTAID) 3000 UNITS tablet Take 1 tablet by mouth 3 (three) times daily as needed.        . metoprolol succinate (TOPROL XL) 25 MG 24 hr tablet Take 1 tablet (25 mg total) by mouth daily.  90 tablet  3  . oxyCODONE-acetaminophen (PERCOCET) 10-325 MG per tablet Take 1 tablet by mouth every 8 (eight) hours as needed.       . potassium chloride (KLOR-CON) 20 MEQ packet Take 20 mEq by mouth daily.  90 packet  2  . pravastatin (PRAVACHOL) 40 MG tablet Take 1 tablet (40 mg total) by mouth daily.  90 tablet  2  . baclofen (LIORESAL) 10 MG tablet        No current facility-administered medications for this visit.    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   Divorced. 3 grown children. 4 GC.     Quit smoking ~20+ yrs ago.     Family History: includes Coronary artery disease in his father and mother; Diabetes in his father; Prostate cancer in his father.  ROS: A comprehensive Review of Systems - Negative except Most notable symptoms are neck and back discomfort. He still has some mild paresthesias in dizziness. He also has tremors better helped with a higher dose beta blocker.  PHYSICAL EXAM BP 142/80  Pulse 61  Ht 6\' 2"  (1.88 m)  Wt 208 lb 8 oz (94.575 kg)  BMI 26.76 kg/m2 General appearance: alert, cooperative, appears stated age, no distress and Healthy-appearing. Normal mood and affect. Neck: no adenopathy, no carotid bruit, no JVD and supple, symmetrical, trachea midline Lungs: clear to auscultation bilaterally, normal percussion bilaterally and Nonlabored, and a movement Heart: RRR with possible soft S4 normal S1 and S2. Also soft SEM at the RUSB. No other R./G. Nondisplaced PMI. Abdomen: soft, non-tender; bowel sounds normal; no masses,  no  organomegaly Extremities: extremities normal, atraumatic, no cyanosis or edema Pulses: 2+ and symmetric Neurologic: Alert and oriented X 3, normal strength and tone. Normal symmetric reflexes. Normal coordination and gait   Adult ECG Report  Rate: 61 ;  Rhythm: normal sinus rhythm and Bigeminy pattern and PACs; prolonged first degree AV block Narrative Interpretation: Borderline incomplete RBBB, cannot rule out septal infarct, however most likely normal. LVH with repolarization abnormalities with associated Left Axis Deviation  Recent Labs: None since October 2014: TC  151, TG 53, LDL 101, HDL 39. CMP and CBC normal  ASSESSMENT / PLAN: To be stable abdomen for cardiac standpoint. He remains on amiodarone, with no initial evaluation for function tests. Point no weight gain from checking PFTs at this point. We'll simply followup with LFTs and thyroid function tests. Otherwise no real active cardiac issues. No diastolic heart failure symptoms.  PAF (paroxysmal atrial fibrillation) - on amiodarone At this point not really sure what the history of this was. It was first diagnosed in 2012, but he has been seeing [name] for quite a long time therefore may not be that needed. He is on amiodarone and low-dose beta blocker, but no anticoagulation. As far as I can tell he is at no recent recurrences of A. Fib.  His EKG is quite unusual with quite frequent PACs and an irregular overall pattern with varying degree of compensatory pause after each beat. He is asymptomatic and I am inclined just to continue with his medication for now to know him better.  In the future, it would be nice to see how he does off of amiodarone.  VENTRICULAR HYPERTROPHY, LEFT No signs or symptoms of diastolic heart failure  HYPERTENSION Relatively stable today. His PCP is also adjusting his medications. For now we'll leave regimen as is. Would not titrate beta blocker up due to fatigue  Tremor This is helped by  low-dose beta blocker. On fine if he would like to take alternating doses of 25 and 12-1/2 of the Toprol. If his tremors get worse he can take a higher dose will then refill the prescription for him. He because of the higher dose as routine, he feels more fatigued, and has had more notable bradycardia.  HYPERLIPIDEMIA Monitored by PCP on pravastatin. He has asked that we go ahead and check his lipid panel since it can thyroid studies as well as CMP.  Long term current use of amiodarone At this point it is a bit too late to check up PFT s time according to the beginning of a baseline. We will need a followup thyroid studies as well as liver panel. We may a lipid panel as well.    Orders Placed This Encounter  Procedures  . Comprehensive metabolic panel  . Lipid panel  . TSH  . T3, free  . T4, free  . EKG 12-Lead   Meds ordered this encounter  Medications  . oxyCODONE-acetaminophen (PERCOCET) 10-325 MG per tablet    Sig: Take 1 tablet by mouth every 8 (eight) hours as needed.   . baclofen (LIORESAL) 10 MG tablet    Sig:     Followup: In one year  DAVID W. Ellyn Hack, M.D., M.S. Interventional Cardiolgy [facility]

## 2013-11-06 NOTE — Assessment & Plan Note (Signed)
No signs or symptoms of diastolic heart failure

## 2013-11-06 NOTE — Assessment & Plan Note (Signed)
At this point it is a bit too late to check up PFT s time according to the beginning of a baseline. We will need a followup thyroid studies as well as liver panel. We may a lipid panel as well.

## 2013-11-06 NOTE — Assessment & Plan Note (Addendum)
Monitored by PCP on pravastatin. He has asked that we go ahead and check his lipid panel since it can thyroid studies as well as CMP.

## 2013-11-15 DIAGNOSIS — Z79899 Other long term (current) drug therapy: Secondary | ICD-10-CM | POA: Diagnosis not present

## 2013-11-15 DIAGNOSIS — E782 Mixed hyperlipidemia: Secondary | ICD-10-CM | POA: Diagnosis not present

## 2013-11-16 ENCOUNTER — Telehealth: Payer: Self-pay | Admitting: *Deleted

## 2013-11-16 LAB — COMPREHENSIVE METABOLIC PANEL
ALT: 22 U/L (ref 0–53)
AST: 26 U/L (ref 0–37)
Albumin: 3.8 g/dL (ref 3.5–5.2)
Alkaline Phosphatase: 99 U/L (ref 39–117)
BUN: 10 mg/dL (ref 6–23)
CO2: 26 mEq/L (ref 19–32)
Calcium: 8.9 mg/dL (ref 8.4–10.5)
Chloride: 102 mEq/L (ref 96–112)
Creat: 0.99 mg/dL (ref 0.50–1.35)
Glucose, Bld: 92 mg/dL (ref 70–99)
Potassium: 4.7 mEq/L (ref 3.5–5.3)
Sodium: 134 mEq/L — ABNORMAL LOW (ref 135–145)
Total Bilirubin: 0.3 mg/dL (ref 0.2–1.2)
Total Protein: 7.6 g/dL (ref 6.0–8.3)

## 2013-11-16 LAB — LIPID PANEL
Cholesterol: 134 mg/dL (ref 0–200)
HDL: 43 mg/dL (ref >39)
LDL Cholesterol: 83 mg/dL (ref 0–99)
Total CHOL/HDL Ratio: 3.1 Ratio
Triglycerides: 42 mg/dL (ref <150)
VLDL: 8 mg/dL (ref 0–40)

## 2013-11-16 LAB — TSH: TSH: 0.755 u[IU]/mL (ref 0.350–4.500)

## 2013-11-16 LAB — T4, FREE: Free T4: 1.46 ng/dL (ref 0.80–1.80)

## 2013-11-16 LAB — T3, FREE: T3, Free: 3.3 pg/mL (ref 2.3–4.2)

## 2013-11-16 NOTE — Progress Notes (Signed)
Quick Note:  Labs look GREAT! - Chemistries, Kidney, Liver & Thyroid function normal. Cholesterol back to good control as it was 4 yr ago !! Much better than 6 months ago!!.  Leonie Man, MD  ______

## 2013-11-16 NOTE — Telephone Encounter (Signed)
Message copied by Raiford Simmonds on Wed Nov 16, 2013  2:40 PM ------      Message from: Ellyn Hack, DAVID W      Created: Wed Nov 16, 2013 12:48 PM       Labs look GREAT! - Chemistries, Kidney, Liver & Thyroid function normal.  Cholesterol back to good control as it was 4 yr ago !! Much better than 6 months ago!!.            Leonie Man, MD       ------

## 2013-11-16 NOTE — Telephone Encounter (Signed)
Spoke to patient. Result given . Verbalized understanding  

## 2013-11-21 ENCOUNTER — Telehealth: Payer: Self-pay | Admitting: Cardiology

## 2013-11-21 MED ORDER — METOPROLOL SUCCINATE ER 25 MG PO TB24: 25.0000 mg | ORAL_TABLET | Freq: Every day | ORAL | Status: DC

## 2013-11-21 NOTE — Telephone Encounter (Signed)
Saw Dr Ellyn Hack April 16th and they were supposed to call in some medicine to [facility] has not received.  Please call.

## 2013-11-21 NOTE — Telephone Encounter (Signed)
Refill done through [facility] for Metoprolol 25mg  qd.  Patient notified and voiced understanding.

## 2013-11-23 ENCOUNTER — Other Ambulatory Visit: Payer: Self-pay | Admitting: *Deleted

## 2013-11-23 MED ORDER — FUROSEMIDE 40 MG PO TABS: 40.0000 mg | ORAL_TABLET | Freq: Every day | ORAL | Status: DC

## 2013-11-24 ENCOUNTER — Ambulatory Visit: Payer: Medicare Other | Admitting: Cardiology

## 2013-11-25 DIAGNOSIS — M4802 Spinal stenosis, cervical region: Secondary | ICD-10-CM | POA: Diagnosis not present

## 2013-11-25 DIAGNOSIS — M47812 Spondylosis without myelopathy or radiculopathy, cervical region: Secondary | ICD-10-CM | POA: Diagnosis not present

## 2013-11-25 DIAGNOSIS — M545 Low back pain, unspecified: Secondary | ICD-10-CM | POA: Diagnosis not present

## 2013-11-25 DIAGNOSIS — M542 Cervicalgia: Secondary | ICD-10-CM | POA: Diagnosis not present

## 2013-11-30 ENCOUNTER — Telehealth: Payer: Self-pay | Admitting: Cardiovascular Disease

## 2013-11-30 DIAGNOSIS — H40019 Open angle with borderline findings, low risk, unspecified eye: Secondary | ICD-10-CM | POA: Diagnosis not present

## 2013-11-30 DIAGNOSIS — H2510 Age-related nuclear cataract, unspecified eye: Secondary | ICD-10-CM | POA: Diagnosis not present

## 2013-11-30 NOTE — Telephone Encounter (Signed)
Calling to get clarification on Furosemide 40mg .[id number] .Marland Kitchen Reference#[id number]  Thanks

## 2013-11-30 NOTE — Telephone Encounter (Signed)
Called pharmacy to clarify medication directions.

## 2013-12-02 ENCOUNTER — Ambulatory Visit: Payer: Medicare Other | Admitting: Internal Medicine

## 2013-12-21 ENCOUNTER — Encounter: Payer: Self-pay | Admitting: Internal Medicine

## 2013-12-21 ENCOUNTER — Telehealth: Payer: Self-pay | Admitting: Internal Medicine

## 2013-12-21 ENCOUNTER — Ambulatory Visit (INDEPENDENT_AMBULATORY_CARE_PROVIDER_SITE_OTHER): Payer: Medicare Other | Admitting: Internal Medicine

## 2013-12-21 VITALS — BP 170/70 | HR 59 | Temp 98.3°F | Ht 74.0 in | Wt 271.8 lb

## 2013-12-21 DIAGNOSIS — R7309 Other abnormal glucose: Secondary | ICD-10-CM

## 2013-12-21 DIAGNOSIS — I10 Essential (primary) hypertension: Secondary | ICD-10-CM

## 2013-12-21 DIAGNOSIS — E785 Hyperlipidemia, unspecified: Secondary | ICD-10-CM

## 2013-12-21 DIAGNOSIS — R7302 Impaired glucose tolerance (oral): Secondary | ICD-10-CM

## 2013-12-21 DIAGNOSIS — G629 Polyneuropathy, unspecified: Secondary | ICD-10-CM

## 2013-12-21 DIAGNOSIS — Z23 Encounter for immunization: Secondary | ICD-10-CM | POA: Diagnosis not present

## 2013-12-21 HISTORY — DX: Polyneuropathy, unspecified: G62.9

## 2013-12-21 HISTORY — DX: Impaired glucose tolerance (oral): R73.02

## 2013-12-21 NOTE — Progress Notes (Signed)
Subjective:    Patient ID: [name], male    DOB: February 12, 1946, 68 y.o.   MRN: [id number]  HPI   Here for yearly f/u;  Overall doing ok;  Pt denies CP, worsening SOB, DOE, wheezing, orthopnea, PND, worsening LE edema, palpitations, dizziness or syncope.  Pt denies neurological change such as new headache, facial or extremity weakness.  Pt denies polydipsia, polyuria, or low sugar symptoms. Pt states overall good compliance with treatment and medications, good tolerability, and has been trying to follow lower cholesterol diet.  Pt denies worsening depressive symptoms, suicidal ideation or panic. No fever, night sweats, wt loss, loss of appetite, or other constitutional symptoms.  Pt states good ability with ADL's, has low fall risk, home safety reviewed and adequate, no other significant changes in hearing or vision, and only occasionally active with exercise. BP at home had been ranging 143/78, more recently in past day has been approx 168/82. Overall good compliance with meds as prescribed, had been taking half toprol 25 mg, then whole pill for the past wk, after last seeing Dr Ellyn Hack.   Past Medical History  Diagnosis Date  . PAF (paroxysmal atrial fibrillation)     Rhythm controlled on Amiodarone.  [facility] Anticoagulation  . Hypertension   . History of sick sinus syndrome     reduced BB dose for Bradycardia  . HLD (hyperlipidemia)   . Diabetes mellitus type 2, controlled   . COPD (chronic obstructive pulmonary disease)   . BPH (benign prostatic hyperplasia)   . History of prostate cancer     Dr. Karsten Ro  . ED (erectile dysfunction)     s/p Penile prosthesis (09/2010)  . Nephrolithiasis   . Diverticulitis of colon   . Colon polyps   . Complex renal cyst 12/2010  . Chronic LBP     Hip & Back -- Sees Dr. Maia Petties @ [facility]  . Osteoarthritis of both hips   . Osteoarthritis of lumbosacral spine     with Disc Disease  . Paresthesias/numbness     Bilateral LE  .  Alcoholism in recovery   . Adenoma 05/2008  . Diabetes mellitus with neuropathy 02/11/2007    Qualifier: Diagnosis of  By: Jenny Reichmann MD, Hunt Oris   . Impaired glucose tolerance 12/21/2013  . Peripheral neuropathy 12/21/2013   Past Surgical History  Procedure Laterality Date  . Prostatectomy    . Penile prosthesis placement  09/2010  . US echocardiography  04/2012    EF of 55%; Mild LVH; no significant valve disease,  Trace MR; RVSP was minimally elevated at 33  . Penile prosthesis implant  06/21/2012    Procedure: PENILE PROTHESIS INFLATABLE;  Surgeon: Claybon Jabs, MD;  Location: [facility];  Service: Urology;  Laterality: N/A;  REMOVAL AND REPLACEMENT OF SOME  OF PROSTHESIS (AMS)   . Nm myoview ltd  2010    No Ischemia or Infarct  . Cardiac catheterization  2003    Normal Coronary Arteries.    reports that he quit smoking about 31 years ago. His smoking use included Cigarettes. He smoked 0.00 packs per day. He has never used smokeless tobacco. He reports that he drinks alcohol. He reports that he does not use illicit drugs. family history includes Coronary artery disease in his father and mother; Diabetes in his father; Prostate cancer in his father. Allergies  Allergen Reactions  . Ciprofloxacin Other (See Comments)    All over weakness   Current Outpatient Prescriptions on  File Prior to Visit  Medication Sig Dispense Refill  . albuterol (PROVENTIL HFA) 108 (90 BASE) MCG/ACT inhaler Inhale 2 puffs into the lungs every 12 (twelve) hours.  3 Inhaler  3  . amiodarone (PACERONE) 200 MG tablet Take 1 tablet (200 mg total) by mouth daily.  90 tablet  2  . amLODipine (NORVASC) 10 MG tablet Take 10 mg by mouth daily.      Marland Kitchen aspirin 81 MG EC tablet Take 81 mg by mouth daily.        . baclofen (LIORESAL) 10 MG tablet       . benazepril (LOTENSIN) 40 MG tablet Take by mouth daily.       . furosemide (LASIX) 40 MG tablet Take 1 tablet (40 mg total) by mouth daily. Alternate 1/2 to 1  tablet daily  135 tablet  3  . ibuprofen (ADVIL,MOTRIN) 200 MG tablet Take 200 mg by mouth as directed.        . lactase (LACTAID) 3000 UNITS tablet Take 1 tablet by mouth 3 (three) times daily as needed.        . metoprolol succinate (TOPROL XL) 25 MG 24 hr tablet Take 1 tablet (25 mg total) by mouth daily.  90 tablet  3  . oxyCODONE-acetaminophen (PERCOCET) 10-325 MG per tablet Take 1 tablet by mouth every 8 (eight) hours as needed.       . potassium chloride (KLOR-CON) 20 MEQ packet Take 20 mEq by mouth daily.  90 packet  2  . pravastatin (PRAVACHOL) 40 MG tablet Take 1 tablet (40 mg total) by mouth daily.  90 tablet  2   No current facility-administered medications on file prior to visit.   Review of Systems Constitutional: Negative for increased diaphoresis, other activity, appetite or other siginficant weight change  HENT: Negative for worsening hearing loss, ear pain, facial swelling, mouth sores and neck stiffness.   Eyes: Negative for other worsening pain, redness or visual disturbance.  Respiratory: Negative for shortness of breath and wheezing.   Cardiovascular: Negative for chest pain and palpitations.  Gastrointestinal: Negative for diarrhea, blood in stool, abdominal distention or other pain Genitourinary: Negative for hematuria, flank pain or change in urine volume.  Musculoskeletal: Negative for myalgias or other joint complaints.  Skin: Negative for color change and wound.  Neurological: Negative for syncope and numbness. other than noted Hematological: Negative for adenopathy. or other swelling Psychiatric/Behavioral: Negative for hallucinations, self-injury, decreased concentration or other worsening agitation.      Objective:   Physical Exam BP 170/70  Pulse 59  Temp(Src) 98.3 F (36.8 C) (Oral)  Ht 6\' 2"  (1.88 m)  Wt 271 lb 12 oz (123.265 kg)  BMI 34.88 kg/m2  SpO2 97% VS noted, repeat BP 142/80 Constitutional: Pt is oriented to person, place, and time.  Appears well-developed and well-nourished.  Head: Normocephalic and atraumatic.  Right Ear: External ear normal.  Left Ear: External ear normal.  Nose: Nose normal.  Mouth/Throat: Oropharynx is clear and moist.  Eyes: Conjunctivae and EOM are normal. Pupils are equal, round, and reactive to light.  Neck: Normal range of motion. Neck supple. No JVD present. No tracheal deviation present.  Cardiovascular: Normal rate, regular rhythm, normal heart sounds and intact distal pulses.   Pulmonary/Chest: Effort normal and breath sounds without rales or wheezing  Abdominal: Soft. Bowel sounds are normal. NT. No HSM  Musculoskeletal: Normal range of motion. Exhibits no edema.  Lymphadenopathy:  Has no cervical adenopathy.  Neurological: Pt is alert  and oriented to person, place, and time. Pt has normal reflexes. No cranial nerve deficit. Motor grossly intact Skin: Skin is warm and dry. No rash noted. trace edema bilat LE Psychiatric:  Has normal mood and affect. Behavior is normal.     Assessment & Plan:

## 2013-12-21 NOTE — Assessment & Plan Note (Signed)
stable overall by history and exam, recent data reviewed with pt, and pt to continue medical treatment as before,  to f/u any worsening symptoms or concerns Lab Results  Component Value Date   HGBA1C 6.7* 08/27/2009

## 2013-12-21 NOTE — Assessment & Plan Note (Signed)
After discussion, pt to focus on further wt loss, low salt and cont'd meds; to avoid incr beta blocker per dr harding last visit; he would most likley be improve don benicar 40 instead of the benazepriol but now retired/disabled, on fixed income, declines for now

## 2013-12-21 NOTE — Assessment & Plan Note (Signed)
stable overall by history and exam, recent data reviewed with pt, and pt to continue medical treatment as before,  to f/u any worsening symptoms or concerns Lab Results  Component Value Date   HGBA1C 6.7* 08/27/2009   Lab Results  Component Value Date   LDLCALC 83 11/15/2013

## 2013-12-21 NOTE — Addendum Note (Signed)
Addended by: Sharon Seller B on: 12/21/2013 11:34 AM   Modules accepted: Orders

## 2013-12-21 NOTE — Telephone Encounter (Signed)
Relevant patient education mailed to patient.  

## 2013-12-21 NOTE — Patient Instructions (Addendum)
You had the new Prevnar pneumonia shot today  We should plan on the "old" pneumonia shot next year  Please continue all other medications as before, and refills have been done if requested. Please have the pharmacy call with any other refills you may need.  Please continue your efforts at being more active, low cholesterol diet, and weight control.  You are otherwise up to date with prevention measures today.  Please keep your appointments with your specialists as you have planned - cardiology  Please return in 6 months, or sooner if needed

## 2013-12-21 NOTE — Progress Notes (Signed)
Pre visit review using our clinic review tool, if applicable. No additional management support is needed unless otherwise documented below in the visit note. 

## 2014-01-23 DIAGNOSIS — C61 Malignant neoplasm of prostate: Secondary | ICD-10-CM | POA: Diagnosis not present

## 2014-01-27 DIAGNOSIS — N529 Male erectile dysfunction, unspecified: Secondary | ICD-10-CM | POA: Diagnosis not present

## 2014-01-27 DIAGNOSIS — C61 Malignant neoplasm of prostate: Secondary | ICD-10-CM | POA: Diagnosis not present

## 2014-02-15 ENCOUNTER — Telehealth: Payer: Self-pay | Admitting: Cardiology

## 2014-02-21 NOTE — Telephone Encounter (Signed)
Closed encounter °

## 2014-02-28 DIAGNOSIS — M47817 Spondylosis without myelopathy or radiculopathy, lumbosacral region: Secondary | ICD-10-CM | POA: Diagnosis not present

## 2014-02-28 DIAGNOSIS — M542 Cervicalgia: Secondary | ICD-10-CM | POA: Diagnosis not present

## 2014-02-28 DIAGNOSIS — M47812 Spondylosis without myelopathy or radiculopathy, cervical region: Secondary | ICD-10-CM | POA: Diagnosis not present

## 2014-02-28 DIAGNOSIS — M545 Low back pain, unspecified: Secondary | ICD-10-CM | POA: Diagnosis not present

## 2014-03-07 DIAGNOSIS — M47817 Spondylosis without myelopathy or radiculopathy, lumbosacral region: Secondary | ICD-10-CM | POA: Diagnosis not present

## 2014-03-07 DIAGNOSIS — M542 Cervicalgia: Secondary | ICD-10-CM | POA: Diagnosis not present

## 2014-03-09 DIAGNOSIS — M542 Cervicalgia: Secondary | ICD-10-CM | POA: Diagnosis not present

## 2014-03-09 DIAGNOSIS — M47817 Spondylosis without myelopathy or radiculopathy, lumbosacral region: Secondary | ICD-10-CM | POA: Diagnosis not present

## 2014-03-14 DIAGNOSIS — M47817 Spondylosis without myelopathy or radiculopathy, lumbosacral region: Secondary | ICD-10-CM | POA: Diagnosis not present

## 2014-03-14 DIAGNOSIS — M542 Cervicalgia: Secondary | ICD-10-CM | POA: Diagnosis not present

## 2014-03-16 DIAGNOSIS — M47817 Spondylosis without myelopathy or radiculopathy, lumbosacral region: Secondary | ICD-10-CM | POA: Diagnosis not present

## 2014-03-16 DIAGNOSIS — M542 Cervicalgia: Secondary | ICD-10-CM | POA: Diagnosis not present

## 2014-03-19 ENCOUNTER — Other Ambulatory Visit: Payer: Self-pay | Admitting: Cardiovascular Disease

## 2014-03-20 NOTE — Telephone Encounter (Signed)
Rx was sent to pharmacy electronically. 

## 2014-03-21 DIAGNOSIS — M47817 Spondylosis without myelopathy or radiculopathy, lumbosacral region: Secondary | ICD-10-CM | POA: Diagnosis not present

## 2014-03-21 DIAGNOSIS — M542 Cervicalgia: Secondary | ICD-10-CM | POA: Diagnosis not present

## 2014-03-23 DIAGNOSIS — M47817 Spondylosis without myelopathy or radiculopathy, lumbosacral region: Secondary | ICD-10-CM | POA: Diagnosis not present

## 2014-03-23 DIAGNOSIS — M542 Cervicalgia: Secondary | ICD-10-CM | POA: Diagnosis not present

## 2014-03-28 DIAGNOSIS — M47817 Spondylosis without myelopathy or radiculopathy, lumbosacral region: Secondary | ICD-10-CM | POA: Diagnosis not present

## 2014-03-28 DIAGNOSIS — M542 Cervicalgia: Secondary | ICD-10-CM | POA: Diagnosis not present

## 2014-03-30 DIAGNOSIS — M47817 Spondylosis without myelopathy or radiculopathy, lumbosacral region: Secondary | ICD-10-CM | POA: Diagnosis not present

## 2014-03-30 DIAGNOSIS — M542 Cervicalgia: Secondary | ICD-10-CM | POA: Diagnosis not present

## 2014-04-04 DIAGNOSIS — M542 Cervicalgia: Secondary | ICD-10-CM | POA: Diagnosis not present

## 2014-04-04 DIAGNOSIS — M47817 Spondylosis without myelopathy or radiculopathy, lumbosacral region: Secondary | ICD-10-CM | POA: Diagnosis not present

## 2014-04-06 DIAGNOSIS — M542 Cervicalgia: Secondary | ICD-10-CM | POA: Diagnosis not present

## 2014-04-06 DIAGNOSIS — M47817 Spondylosis without myelopathy or radiculopathy, lumbosacral region: Secondary | ICD-10-CM | POA: Diagnosis not present

## 2014-04-11 DIAGNOSIS — M542 Cervicalgia: Secondary | ICD-10-CM | POA: Diagnosis not present

## 2014-04-11 DIAGNOSIS — M47817 Spondylosis without myelopathy or radiculopathy, lumbosacral region: Secondary | ICD-10-CM | POA: Diagnosis not present

## 2014-04-13 DIAGNOSIS — M47817 Spondylosis without myelopathy or radiculopathy, lumbosacral region: Secondary | ICD-10-CM | POA: Diagnosis not present

## 2014-04-13 DIAGNOSIS — M542 Cervicalgia: Secondary | ICD-10-CM | POA: Diagnosis not present

## 2014-04-17 DIAGNOSIS — M542 Cervicalgia: Secondary | ICD-10-CM | POA: Diagnosis not present

## 2014-04-17 DIAGNOSIS — M47817 Spondylosis without myelopathy or radiculopathy, lumbosacral region: Secondary | ICD-10-CM | POA: Diagnosis not present

## 2014-04-20 ENCOUNTER — Telehealth: Payer: Self-pay | Admitting: Internal Medicine

## 2014-04-20 DIAGNOSIS — M47817 Spondylosis without myelopathy or radiculopathy, lumbosacral region: Secondary | ICD-10-CM | POA: Diagnosis not present

## 2014-04-20 DIAGNOSIS — M4807 Spinal stenosis, lumbosacral region: Secondary | ICD-10-CM | POA: Diagnosis not present

## 2014-04-20 DIAGNOSIS — M542 Cervicalgia: Secondary | ICD-10-CM | POA: Diagnosis not present

## 2014-04-20 DIAGNOSIS — G894 Chronic pain syndrome: Secondary | ICD-10-CM | POA: Diagnosis not present

## 2014-04-20 DIAGNOSIS — Z79891 Long term (current) use of opiate analgesic: Secondary | ICD-10-CM | POA: Diagnosis not present

## 2014-04-20 NOTE — Telephone Encounter (Signed)
Pt called requesting to know when was the last time he had his flu and pneumonia shot, he think he had bother last year around June. Please give pt a call.

## 2014-04-20 NOTE — Telephone Encounter (Signed)
Called the patient informed his last pneumonia shot was 6/15 and flu was 11/14.  Did inform and scheduled him for flu clinic.

## 2014-04-24 DIAGNOSIS — M47817 Spondylosis without myelopathy or radiculopathy, lumbosacral region: Secondary | ICD-10-CM | POA: Diagnosis not present

## 2014-04-24 DIAGNOSIS — M542 Cervicalgia: Secondary | ICD-10-CM | POA: Diagnosis not present

## 2014-04-27 DIAGNOSIS — M542 Cervicalgia: Secondary | ICD-10-CM | POA: Diagnosis not present

## 2014-04-27 DIAGNOSIS — M47817 Spondylosis without myelopathy or radiculopathy, lumbosacral region: Secondary | ICD-10-CM | POA: Diagnosis not present

## 2014-04-28 ENCOUNTER — Ambulatory Visit (INDEPENDENT_AMBULATORY_CARE_PROVIDER_SITE_OTHER): Payer: Medicare Other

## 2014-04-28 DIAGNOSIS — Z23 Encounter for immunization: Secondary | ICD-10-CM | POA: Diagnosis not present

## 2014-05-01 DIAGNOSIS — M542 Cervicalgia: Secondary | ICD-10-CM | POA: Diagnosis not present

## 2014-05-01 DIAGNOSIS — M47817 Spondylosis without myelopathy or radiculopathy, lumbosacral region: Secondary | ICD-10-CM | POA: Diagnosis not present

## 2014-05-02 ENCOUNTER — Ambulatory Visit (INDEPENDENT_AMBULATORY_CARE_PROVIDER_SITE_OTHER): Payer: Medicare Other | Admitting: Cardiology

## 2014-05-02 ENCOUNTER — Encounter: Payer: Self-pay | Admitting: Cardiology

## 2014-05-02 VITALS — BP 134/72 | HR 58 | Ht 74.0 in | Wt 271.9 lb

## 2014-05-02 DIAGNOSIS — G250 Essential tremor: Secondary | ICD-10-CM

## 2014-05-02 DIAGNOSIS — Z79899 Other long term (current) drug therapy: Secondary | ICD-10-CM | POA: Diagnosis not present

## 2014-05-02 DIAGNOSIS — I480 Paroxysmal atrial fibrillation: Secondary | ICD-10-CM

## 2014-05-02 DIAGNOSIS — R251 Tremor, unspecified: Secondary | ICD-10-CM

## 2014-05-02 DIAGNOSIS — E785 Hyperlipidemia, unspecified: Secondary | ICD-10-CM

## 2014-05-02 DIAGNOSIS — I517 Cardiomegaly: Secondary | ICD-10-CM | POA: Diagnosis not present

## 2014-05-02 DIAGNOSIS — I10 Essential (primary) hypertension: Secondary | ICD-10-CM | POA: Diagnosis not present

## 2014-05-02 DIAGNOSIS — E669 Obesity, unspecified: Secondary | ICD-10-CM

## 2014-05-02 NOTE — Patient Instructions (Signed)
To date, things look god with your heart.  Your recent labs were great.  As we discussed, I would like to see how you do off of Amiodarone -- to come off of it, I want you to take 1 tab every other day x 2 weeks, then stop altogether.  I will check you back in ~3 months to make sure that you are not having issues with going back into Atrial Fibrillation.    Your EKG is stable as is your blood pressure -- just take Amlodipine & Benazapril (1 in AM & 1 in PM).  Leonie Man, MD   Your physician wants you to follow-up in: 3 You will receive a reminder letter in the mail two months in advance. If you don't receive a letter, please call our office to schedule the follow-up appointment.

## 2014-05-03 ENCOUNTER — Other Ambulatory Visit: Payer: Self-pay

## 2014-05-03 MED ORDER — AMLODIPINE BESYLATE 10 MG PO TABS: 10.0000 mg | ORAL_TABLET | Freq: Every day | ORAL | Status: DC

## 2014-05-03 NOTE — Telephone Encounter (Signed)
Rx sent to pharmacy   

## 2014-05-04 ENCOUNTER — Encounter: Payer: Self-pay | Admitting: Cardiology

## 2014-05-04 DIAGNOSIS — M542 Cervicalgia: Secondary | ICD-10-CM | POA: Diagnosis not present

## 2014-05-04 DIAGNOSIS — M47817 Spondylosis without myelopathy or radiculopathy, lumbosacral region: Secondary | ICD-10-CM | POA: Diagnosis not present

## 2014-05-04 DIAGNOSIS — G250 Essential tremor: Secondary | ICD-10-CM | POA: Insufficient documentation

## 2014-05-04 DIAGNOSIS — E669 Obesity, unspecified: Secondary | ICD-10-CM | POA: Insufficient documentation

## 2014-05-04 NOTE — Progress Notes (Signed)
PCP: Cathlean Cower, MD  Clinic Note: Chief Complaint  Patient presents with  . Follow-up    ROV 50M; SOB at times, tremors, R ankle swelling; no other complaints    HPI: [name] is a 68 y.o. male with a PMH below who presents today for six-month followup of PAF, currently on amiodarone for rhythm control and not on any formal anticoagulation. I last saw him in April of this year as the first visit establishing me is his cardiologist Dr. long-term care under Dr. Terance Ice.  Past Medical History  Diagnosis Date  . PAF (paroxysmal atrial fibrillation)     Rhythm controlled on Amiodarone.  [facility] Anticoagulation  . Hypertension   . History of sick sinus syndrome     reduced BB dose for Bradycardia  . HLD (hyperlipidemia)   . Diabetes mellitus type 2, controlled   . COPD (chronic obstructive pulmonary disease)   . BPH (benign prostatic hyperplasia)   . History of prostate cancer     Dr. Karsten Ro  . ED (erectile dysfunction)     s/p Penile prosthesis (09/2010)  . Nephrolithiasis   . Diverticulitis of colon   . Colon polyps   . Complex renal cyst 12/2010  . Chronic LBP     Hip & Back -- Sees Dr. Maia Petties @ [facility]  . Osteoarthritis of both hips   . Osteoarthritis of lumbosacral spine     with Disc Disease  . Paresthesias/numbness     Bilateral LE  . Alcoholism in recovery   . Adenoma 05/2008  . Diabetes mellitus with neuropathy 02/11/2007    Qualifier: Diagnosis of  By: Jenny Reichmann MD, Hunt Oris   . Impaired glucose tolerance 12/21/2013  . Peripheral neuropathy 12/21/2013    Prior Cardiac Evaluation and Past Surgical History: Procedure Laterality Date  . US echocardiography  04/2012    EF of 55%; Mild LVH; no significant valve disease,  Trace MR; RVSP was minimally elevated at 33  . Nm myoview ltd  2010    No Ischemia or Infarct  . Cardiac catheterization  2003    Normal Coronary Arteries.    Interval History: He presents today for routine followup  without really any major complaints. He says every now and then he may feel intermittent palpitations but no sustained rhythm issues. He also has intermittent episodes of feeling short of breath when he is protecting his breath, but this can occur at rest or with would not exertion.  He basically denies any rapid irregular heartbeats, chest tightness or pressure with rest or exertion.  He does get some dyspnea with prolonged or strenuous exertion. No PND, orthopnea or significant edema. No syncope/near syncope or TIA/amaurosis fugax symptoms. He is somewhat active, but is not really doing any routine exercise. He says he's tried to improve his eating habits. He denies any bleeding issues such as easy bruising, melena, hematochezia, hematuria or epistaxis.  He is concerned about some medications he is on and would like to try to come off amiodarone.  ROS: A comprehensive was performed. Review of Systems  Constitutional: Negative.   HENT: Negative for nosebleeds.   Respiratory: Positive for shortness of breath. Negative for cough.        He intermittently feels short of breath without really any exacerbating feature. It often occurs at rest, and sometimes with exertion.  Cardiovascular: Positive for palpitations. Negative for claudication.  Gastrointestinal: Negative for heartburn, blood in stool and melena.  Genitourinary: Negative for hematuria.  Musculoskeletal: Positive for back pain and neck pain.  Neurological: Positive for dizziness and tremors. Negative for headaches.       Tremors are better with a walker, but still present. Mild paresthesias  All other systems reviewed and are negative.   Current Outpatient Prescriptions on File Prior to Visit  Medication Sig Dispense Refill  . albuterol (PROVENTIL HFA) 108 (90 BASE) MCG/ACT inhaler Inhale 2 puffs into the lungs every 12 (twelve) hours.  3 Inhaler  3  . amiodarone (PACERONE) 200 MG tablet Take 1 tablet by mouth  daily  90 tablet  2    . aspirin 81 MG EC tablet Take 81 mg by mouth daily.        . benazepril (LOTENSIN) 40 MG tablet Take by mouth daily.       . furosemide (LASIX) 40 MG tablet Take 1 tablet (40 mg total) by mouth daily. Alternate 1/2 to 1 tablet daily  135 tablet  3  . ibuprofen (ADVIL,MOTRIN) 200 MG tablet Take 200 mg by mouth as directed.        . lactase (LACTAID) 3000 UNITS tablet Take 1 tablet by mouth 3 (three) times daily as needed.        . metoprolol succinate (TOPROL XL) 25 MG 24 hr tablet Take 1 tablet (25 mg total) by mouth daily.  90 tablet  3  . oxyCODONE-acetaminophen (PERCOCET) 10-325 MG per tablet Take 1 tablet by mouth every 8 (eight) hours as needed.       . potassium chloride SA (K-DUR,KLOR-CON) 20 MEQ tablet Take 1 tablet by mouth  daily  90 tablet  2  . pravastatin (PRAVACHOL) 40 MG tablet Take 1 tablet (40 mg total) by mouth daily.  90 tablet  2   No current facility-administered medications on file prior to visit.    ALLERGIES REVIEWED IN EPIC -- NO change SOCIAL AND FAMILY HISTORY REVIEWED IN EPIC -- NO change  Wt Readings from Last 3 Encounters:  05/02/14 271 lb 14.4 oz (123.333 kg)  12/21/13 271 lb 12 oz (123.265 kg)  11/03/13 208 lb 8 oz (94.575 kg)    PHYSICAL EXAM BP 134/72  Pulse 58  Ht 6\' 2"  (1.88 m)  Wt 271 lb 14.4 oz (123.333 kg)  BMI 34.90 kg/m2 General appearance: alert, cooperative, appears stated age, no distress and Healthy-appearing. Normal mood and affect.  Neck: no adenopathy, no carotid bruit, no JVD and supple, symmetrical, trachea midline  Lungs:CTAB, normal percussion bilaterally and Nonlabored, and a movement  Heart: RRR with possible soft S4 normal S1 and S2. Also soft SEM at the RUSB. No other R./G. Nondisplaced PMI.  Abdomen: soft, non-tender; bowel sounds normal; no masses, no organomegaly  Extremities: extremities normal, atraumatic, no cyanosis or edema  Pulses: 2+ and symmetric  Neurologic: Alert and oriented X 3, normal strength and tone.  Normal symmetric reflexes. Normal coordination and gait   Adult ECG Report  Rate: 58 ;  Rhythm: sinus bradycardia and 1 A-V B. lateral ST and T-wave changes with T-wave inversions, cannot rule out anterolateral ischemia.  Narrative Interpretation: No change from previous EKG  Recent Labs:    Lipid Panel     Component Value Date/Time   CHOL 134 11/15/2013 1133   TRIG 42 11/15/2013 1133   HDL 43 11/15/2013 1133   CHOLHDL 3.1 11/15/2013 1133   VLDL 8 11/15/2013 1133   LDLCALC 83 11/15/2013 1133    ASSESSMENT / PLAN: PAF (paroxysmal atrial fibrillation) - on amiodarone He seems  to be relatively stable from a rhythm and rate control. He has been on amiodarone for quite a few years. I would like to see how he does off of amiodarone in order to avoid long-term use of this medication. He is on a decent dose of beta blocker.  I will have him cut his amiodarone off over a slow wean technique every other day for 2 weeks and then stop altogether. I will recheck him in roughly 2 months to see how he is doing, we may need to increase the beta blocker dose.  History not on anticoagulation we will need to continue to reassess his risk factors:  CHA2DS2VASC2 - score is essentially 2 with age of 81 and hypertension. For now we will continue with aspirin, but as he gets older, or has more recurrences I would consider strongly converted to full anticoagulation.  Essential hypertension His blood pressure looks pretty good on beta blocker and ACE inhibitor. He is also on amlodipine 10 mg. For now continue current medications, do anticipate that with him being off amiodarone we will be able to increase Toprol if needed.  Hyperlipidemia with target LDL less than 100 Great results on April labs. He remains on steady dose of pravastatin.  Obesity (BMI 30-39.9) He is trying to increase his activity level and monitoring his diet. He has lost 7 pounds since April.    Orders Placed This Encounter  Procedures  .  EKG 12-Lead   No orders of the defined types were placed in this encounter.    Followup: 3 months   [name],DAVID W, M.D., M.S. Interventional Cardiologist   Pager # [phone]

## 2014-05-04 NOTE — Assessment & Plan Note (Addendum)
He seems to be relatively stable from a rhythm and rate control. He has been on amiodarone for quite a few years. I would like to see how he does off of amiodarone in order to avoid long-term use of this medication. He is on a decent dose of beta blocker.  I will have him cut his amiodarone off over a slow wean technique every other day for 2 weeks and then stop altogether. I will recheck him in roughly 2 months to see how he is doing, we may need to increase the beta blocker dose.  History not on anticoagulation we will need to continue to reassess his risk factors:  CHA2DS2VASC2 - score is essentially 2 with age of 81 and hypertension. For now we will continue with aspirin, but as he gets older, or has more recurrences I would consider strongly converted to full anticoagulation.

## 2014-05-04 NOTE — Assessment & Plan Note (Signed)
His blood pressure looks pretty good on beta blocker and ACE inhibitor. He is also on amlodipine 10 mg. For now continue current medications, do anticipate that with him being off amiodarone we will be able to increase Toprol if needed.

## 2014-05-04 NOTE — Assessment & Plan Note (Signed)
Great results on April labs. He remains on steady dose of pravastatin.

## 2014-05-04 NOTE — Assessment & Plan Note (Signed)
He is trying to increase his activity level and monitoring his diet. He has lost 7 pounds since April.

## 2014-05-08 DIAGNOSIS — M47817 Spondylosis without myelopathy or radiculopathy, lumbosacral region: Secondary | ICD-10-CM | POA: Diagnosis not present

## 2014-05-08 DIAGNOSIS — M542 Cervicalgia: Secondary | ICD-10-CM | POA: Diagnosis not present

## 2014-05-11 DIAGNOSIS — M47817 Spondylosis without myelopathy or radiculopathy, lumbosacral region: Secondary | ICD-10-CM | POA: Diagnosis not present

## 2014-05-11 DIAGNOSIS — M542 Cervicalgia: Secondary | ICD-10-CM | POA: Diagnosis not present

## 2014-05-16 DIAGNOSIS — M542 Cervicalgia: Secondary | ICD-10-CM | POA: Diagnosis not present

## 2014-05-16 DIAGNOSIS — M47817 Spondylosis without myelopathy or radiculopathy, lumbosacral region: Secondary | ICD-10-CM | POA: Diagnosis not present

## 2014-05-24 DIAGNOSIS — M47817 Spondylosis without myelopathy or radiculopathy, lumbosacral region: Secondary | ICD-10-CM | POA: Diagnosis not present

## 2014-05-24 DIAGNOSIS — M542 Cervicalgia: Secondary | ICD-10-CM | POA: Diagnosis not present

## 2014-05-30 DIAGNOSIS — M542 Cervicalgia: Secondary | ICD-10-CM | POA: Diagnosis not present

## 2014-05-30 DIAGNOSIS — M47817 Spondylosis without myelopathy or radiculopathy, lumbosacral region: Secondary | ICD-10-CM | POA: Diagnosis not present

## 2014-05-31 ENCOUNTER — Ambulatory Visit (INDEPENDENT_AMBULATORY_CARE_PROVIDER_SITE_OTHER): Payer: Medicare Other | Admitting: *Deleted

## 2014-05-31 DIAGNOSIS — Z23 Encounter for immunization: Secondary | ICD-10-CM

## 2014-06-08 ENCOUNTER — Other Ambulatory Visit: Payer: Self-pay | Admitting: *Deleted

## 2014-06-08 MED ORDER — PRAVASTATIN SODIUM 40 MG PO TABS: 40.0000 mg | ORAL_TABLET | Freq: Every day | ORAL | Status: DC

## 2014-06-08 MED ORDER — BENAZEPRIL HCL 40 MG PO TABS: 40.0000 mg | ORAL_TABLET | Freq: Every day | ORAL | Status: DC

## 2014-06-08 NOTE — Telephone Encounter (Signed)
Refilled electronically 

## 2014-06-21 DIAGNOSIS — M47817 Spondylosis without myelopathy or radiculopathy, lumbosacral region: Secondary | ICD-10-CM | POA: Diagnosis not present

## 2014-06-21 DIAGNOSIS — M542 Cervicalgia: Secondary | ICD-10-CM | POA: Diagnosis not present

## 2014-06-22 ENCOUNTER — Ambulatory Visit: Payer: Medicare Other | Admitting: Internal Medicine

## 2014-07-05 ENCOUNTER — Ambulatory Visit (INDEPENDENT_AMBULATORY_CARE_PROVIDER_SITE_OTHER): Payer: Medicare Other | Admitting: Internal Medicine

## 2014-07-05 ENCOUNTER — Encounter: Payer: Self-pay | Admitting: Internal Medicine

## 2014-07-05 ENCOUNTER — Other Ambulatory Visit (INDEPENDENT_AMBULATORY_CARE_PROVIDER_SITE_OTHER): Payer: Medicare Other

## 2014-07-05 VITALS — BP 132/68 | HR 70 | Temp 98.1°F | Ht 74.0 in | Wt 269.0 lb

## 2014-07-05 DIAGNOSIS — R7302 Impaired glucose tolerance (oral): Secondary | ICD-10-CM | POA: Diagnosis not present

## 2014-07-05 DIAGNOSIS — R0600 Dyspnea, unspecified: Secondary | ICD-10-CM

## 2014-07-05 DIAGNOSIS — E785 Hyperlipidemia, unspecified: Secondary | ICD-10-CM

## 2014-07-05 DIAGNOSIS — R202 Paresthesia of skin: Secondary | ICD-10-CM

## 2014-07-05 DIAGNOSIS — I10 Essential (primary) hypertension: Secondary | ICD-10-CM | POA: Diagnosis not present

## 2014-07-05 DIAGNOSIS — R0609 Other forms of dyspnea: Secondary | ICD-10-CM | POA: Insufficient documentation

## 2014-07-05 LAB — HEPATIC FUNCTION PANEL
ALT: 30 U/L (ref 0–53)
AST: 32 U/L (ref 0–37)
Albumin: 3.6 g/dL (ref 3.5–5.2)
Alkaline Phosphatase: 103 U/L (ref 39–117)
Bilirubin, Direct: 0 mg/dL (ref 0.0–0.3)
Total Bilirubin: 0.5 mg/dL (ref 0.2–1.2)
Total Protein: 8.1 g/dL (ref 6.0–8.3)

## 2014-07-05 LAB — BASIC METABOLIC PANEL
BUN: 22 mg/dL (ref 6–23)
CO2: 25 mEq/L (ref 19–32)
Calcium: 8.9 mg/dL (ref 8.4–10.5)
Chloride: 108 mEq/L (ref 96–112)
Creatinine, Ser: 1.1 mg/dL (ref 0.4–1.5)
GFR: 82.26 mL/min (ref >60.00)
Glucose, Bld: 98 mg/dL (ref 70–99)
Potassium: 4.3 mEq/L (ref 3.5–5.1)
Sodium: 137 mEq/L (ref 135–145)

## 2014-07-05 LAB — CBC WITH DIFFERENTIAL/PLATELET
Basophils Absolute: 0 10*3/uL (ref 0.0–0.1)
Basophils Relative: 0.3 % (ref 0.0–3.0)
Eosinophils Absolute: 0.2 10*3/uL (ref 0.0–0.7)
Eosinophils Relative: 2.8 % (ref 0.0–5.0)
HCT: 42.5 % (ref 39.0–52.0)
Hemoglobin: 13.9 g/dL (ref 13.0–17.0)
Lymphocytes Relative: 39.9 % (ref 12.0–46.0)
Lymphs Abs: 2.2 10*3/uL (ref 0.7–4.0)
MCHC: 32.8 g/dL (ref 30.0–36.0)
MCV: 85.4 fl (ref 78.0–100.0)
Monocytes Absolute: 0.9 10*3/uL (ref 0.1–1.0)
Monocytes Relative: 16.9 % — ABNORMAL HIGH (ref 3.0–12.0)
Neutro Abs: 2.2 10*3/uL (ref 1.4–7.7)
Neutrophils Relative %: 40.1 % — ABNORMAL LOW (ref 43.0–77.0)
Platelets: 194 10*3/uL (ref 150.0–400.0)
RBC: 4.97 Mil/uL (ref 4.22–5.81)
RDW: 14.7 % (ref 11.5–15.5)
WBC: 5.6 10*3/uL (ref 4.0–10.5)

## 2014-07-05 LAB — LIPID PANEL
Cholesterol: 132 mg/dL (ref 0–200)
HDL: 35.9 mg/dL — ABNORMAL LOW (ref >39.00)
LDL Cholesterol: 87 mg/dL (ref 0–99)
NonHDL: 96.1
Total CHOL/HDL Ratio: 4
Triglycerides: 45 mg/dL (ref 0.0–149.0)
VLDL: 9 mg/dL (ref 0.0–40.0)

## 2014-07-05 LAB — HEMOGLOBIN A1C: Hgb A1c MFr Bld: 6.5 % (ref 4.6–6.5)

## 2014-07-05 MED ORDER — BUDESONIDE-FORMOTEROL FUMARATE 160-4.5 MCG/ACT IN AERO: 2.0000 | INHALATION_SPRAY | Freq: Two times a day (BID) | RESPIRATORY_TRACT | Status: DC

## 2014-07-05 NOTE — Progress Notes (Signed)
Subjective:    Patient ID: [name], male    DOB: [birth year], 68 y.o.   MRN: [id number]  HPI  Here to f/u -  C/o dyspnea rather quickly with exertion, sometimes even at rest.  Had normal PFT's previously. Trial rescue inhaler helps, needs refill, just more concerned more recently as more severe ; not more freq but is persistent and concerning.  Also sees Dr [name]/card - not related to dyspnea per pt. Has not tried steroid inhaler. No Cp and Pt denies chest pain,overt wheezing, PND, increased LE swelling, palpitations, dizziness or syncope.  Also with numbness to bottoms of feet, ongoing for > 1 yr, no pain but just mentioning. No recent B12 check,  Has known whole c-spine/LS spine deg changes by recnet eval and MRI per Dr Maia Petties, numbness overall mild, declines NCS.  Has BP monitor for home (alst checks HR) - HR occas higher but usually 55-65, seems to be tremors worse when BP is more elevated Past Medical History  Diagnosis Date  . PAF (paroxysmal atrial fibrillation)     Rhythm controlled on Amiodarone.  [facility] Anticoagulation  . Hypertension   . History of sick sinus syndrome     reduced BB dose for Bradycardia  . HLD (hyperlipidemia)   . Diabetes mellitus type 2, controlled   . COPD (chronic obstructive pulmonary disease)   . BPH (benign prostatic hyperplasia)   . History of prostate cancer     Dr. Karsten Ro  . ED (erectile dysfunction)     s/p Penile prosthesis (09/2010)  . Nephrolithiasis   . Diverticulitis of colon   . Colon polyps   . Complex renal cyst 12/2010  . Chronic LBP     Hip & Back -- Sees Dr. Maia Petties @ [facility]  . Osteoarthritis of both hips   . Osteoarthritis of lumbosacral spine     with Disc Disease  . Paresthesias/numbness     Bilateral LE  . Alcoholism in recovery   . Adenoma 05/2008  . Diabetes mellitus with neuropathy 02/11/2007    Qualifier: Diagnosis of  By: Jenny Reichmann MD, Hunt Oris   . Impaired glucose tolerance 12/21/2013  . Peripheral  neuropathy 12/21/2013   Past Surgical History  Procedure Laterality Date  . Prostatectomy    . Penile prosthesis placement  09/2010  . US echocardiography  04/2012    EF of 55%; Mild LVH; no significant valve disease,  Trace MR; RVSP was minimally elevated at 33  . Penile prosthesis implant  06/21/2012    Procedure: PENILE PROTHESIS INFLATABLE;  Surgeon: Claybon Jabs, MD;  Location[facility];  Service: Urology;  Laterality: N/A;  REMOVAL AND REPLACEMENT OF SOME  OF PROSTHESIS (AMS)   . Nm myoview ltd  2010    No Ischemia or Infarct  . Cardiac catheterization  2003    Normal Coronary Arteries.    reports that he quit smoking about 31 years ago. His smoking use included Cigarettes. He smoked 0.00 packs per day. He has never used smokeless tobacco. He reports that he drinks alcohol. He reports that he does not use illicit drugs. family history includes Coronary artery disease in his father and mother; Diabetes in his father; Prostate cancer in his father. Allergies  Allergen Reactions  . Ciprofloxacin Other (See Comments)    All over weakness   Current Outpatient Prescriptions on File Prior to Visit  Medication Sig Dispense Refill  . albuterol (PROVENTIL HFA) 108 (90 BASE) MCG/ACT inhaler Inhale  2 puffs into the lungs every 12 (twelve) hours. 3 Inhaler 3  . amiodarone (PACERONE) 200 MG tablet Take 1 tablet by mouth  daily 90 tablet 2  . amLODipine (NORVASC) 10 MG tablet Take 1 tablet (10 mg total) by mouth daily. 90 tablet 8  . aspirin 81 MG EC tablet Take 81 mg by mouth daily.      . benazepril (LOTENSIN) 40 MG tablet Take 1 tablet (40 mg total) by mouth daily. 90 tablet 1  . furosemide (LASIX) 40 MG tablet Take 1 tablet (40 mg total) by mouth daily. Alternate 1/2 to 1 tablet daily 135 tablet 3  . ibuprofen (ADVIL,MOTRIN) 200 MG tablet Take 200 mg by mouth as directed.      . lactase (LACTAID) 3000 UNITS tablet Take 1 tablet by mouth 3 (three) times daily as needed.        . metoprolol succinate (TOPROL XL) 25 MG 24 hr tablet Take 1 tablet (25 mg total) by mouth daily. 90 tablet 3  . oxyCODONE-acetaminophen (PERCOCET) 10-325 MG per tablet Take 1 tablet by mouth every 8 (eight) hours as needed.     . potassium chloride SA (K-DUR,KLOR-CON) 20 MEQ tablet Take 1 tablet by mouth  daily 90 tablet 2  . pravastatin (PRAVACHOL) 40 MG tablet Take 1 tablet (40 mg total) by mouth daily. 90 tablet 1   No current facility-administered medications on file prior to visit.      Review of Systems   Constitutional: Negative for unusual diaphoresis or other sweats  HENT: Negative for ringing in ear Eyes: Negative for double vision or worsening visual disturbance.  Respiratory: Negative for choking and stridor.   Gastrointestinal: Negative for vomiting or other signifcant bowel change Genitourinary: Negative for hematuria or decreased urine volume.  Musculoskeletal: Negative for other MSK pain or swelling Skin: Negative for color change and worsening wound.  Neurological: Negative for tremors and numbness other than noted  Psychiatric/Behavioral: Negative for decreased concentration or agitation other than above       Objective:   Physical Exam BP 132/68 mmHg  Pulse 70  Temp(Src) 98.1 F (36.7 C) (Oral)  Ht 6\' 2"  (1.88 m)  Wt 269 lb (122.018 kg)  BMI 34.52 kg/m2  SpO2 96% VS noted,  Constitutional: Pt appears well-developed, well-nourished.  HENT: Head: NCAT.  Right Ear: External ear normal.  Left Ear: External ear normal.  Eyes: . Pupils are equal, round, and reactive to light. Conjunctivae and EOM are normal Neck: Normal range of motion. Neck supple.  Cardiovascular: Normal rate and regular rhythm.   Pulmonary/Chest: Effort normal and breath sounds decreased bilat  Abd:  Soft, NT, ND, + BS Neurological: Pt is alert. Not confused , motor grossly intact, some mild decr sens to LT plantar feet. O/w neurovasc intact Skin: Skin is warm. No rash Psychiatric: Pt  behavior is normal. No agitation.      Assessment & Plan:

## 2014-07-05 NOTE — Assessment & Plan Note (Signed)
?   New periph neuropathy, declines ncs, but will check b12

## 2014-07-05 NOTE — Patient Instructions (Signed)
Please take all new medication as prescribed - the symbicort (or advair or dulera if insurance will not cover this one)  Please continue all other medications as before, and refills have been done if requested.  Please have the pharmacy call with any other refills you may need.  Please continue your efforts at being more active, low cholesterol diet, and weight control.  Please keep your appointments with your specialists as you may have planned - Dr Ellyn Hack  Please go to the LAB in the Basement (turn left off the elevator) for the tests to be done today  You will be contacted by phone if any changes need to be made immediately.  Otherwise, you will receive a letter about your results with an explanation, but please check with MyChart first.  Please remember to sign up for MyChart if you have not done so, as this will be important to you in the future with finding out test results, communicating by private email, and scheduling acute appointments online when needed.  Please return in 6 months, or sooner if needed

## 2014-07-05 NOTE — Assessment & Plan Note (Signed)
stable overall by history and exam, recent data reviewed with pt, and pt to continue medical treatment as before,  to f/u any worsening symptoms or concerns BP Readings from Last 3 Encounters:  07/05/14 132/68  05/02/14 134/72  12/21/13 170/70

## 2014-07-05 NOTE — Assessment & Plan Note (Signed)
stable overall by history and exam, recent data reviewed with pt, and pt to continue medical treatment as before,  to f/u any worsening symptoms or concerns Lab Results  Component Value Date   LDLCALC 83 11/15/2013   For f/u labs

## 2014-07-05 NOTE — Progress Notes (Signed)
Pre visit review using our clinic review tool, if applicable. No additional management support is needed unless otherwise documented below in the visit note. 

## 2014-07-05 NOTE — Assessment & Plan Note (Signed)
Has hx of copd, recent inhaler use helps prn, to add symbicort asd,  to f/u any worsening symptoms or concerns

## 2014-07-05 NOTE — Assessment & Plan Note (Signed)
Asymtp, for f/u a1c

## 2014-07-06 ENCOUNTER — Telehealth: Payer: Self-pay | Admitting: Internal Medicine

## 2014-07-06 MED ORDER — BUDESONIDE-FORMOTEROL FUMARATE 160-4.5 MCG/ACT IN AERO: 2.0000 | INHALATION_SPRAY | Freq: Two times a day (BID) | RESPIRATORY_TRACT | Status: DC

## 2014-07-06 NOTE — Telephone Encounter (Signed)
Pt called stated budesonide-formoterol need to be sent to [facility] on [address]. Please check, look like it went to [facility]

## 2014-07-06 NOTE — Telephone Encounter (Signed)
Refill done as requested 

## 2014-07-18 ENCOUNTER — Telehealth: Payer: Self-pay | Admitting: Cardiology

## 2014-07-18 NOTE — Telephone Encounter (Signed)
Pt called here stating he was started on Symbicort, worried about effects as related to his current cardiac meds. I discussed this with other nursing staff at office, stated to pt Symbicort should be fine to take w/ current meds.  Pt has f/u scheduled already w/ Dr. Ellyn Hack, told pt to communicate w/ [place] on any benefit/detriment he has after starting on med.

## 2014-07-18 NOTE — Telephone Encounter (Signed)
Pt called in stating that his PCP wants to put him on Symbicort and the want would like to know if this medication will be a conflict to the medications he is currently taking and his heart condition. Please call  Thanks

## 2014-07-24 NOTE — Telephone Encounter (Signed)
symbicort is OK with cardiac meds.  Leonie Man, MD

## 2014-08-11 ENCOUNTER — Ambulatory Visit: Payer: Medicare Other | Admitting: Cardiology

## 2014-08-24 ENCOUNTER — Ambulatory Visit: Payer: Medicare Other | Admitting: Cardiology

## 2014-09-05 ENCOUNTER — Telehealth: Payer: Self-pay | Admitting: Internal Medicine

## 2014-09-05 NOTE — Telephone Encounter (Signed)
Did not need to start an encounter

## 2014-09-06 ENCOUNTER — Telehealth: Payer: Self-pay | Admitting: Cardiology

## 2014-09-06 MED ORDER — BENAZEPRIL HCL 40 MG PO TABS: 40.0000 mg | ORAL_TABLET | Freq: Every day | ORAL | Status: DC

## 2014-09-06 MED ORDER — POTASSIUM CHLORIDE CRYS ER 20 MEQ PO TBCR: EXTENDED_RELEASE_TABLET | ORAL | Status: DC

## 2014-09-06 MED ORDER — PRAVASTATIN SODIUM 40 MG PO TABS: 40.0000 mg | ORAL_TABLET | Freq: Every day | ORAL | Status: DC

## 2014-09-06 MED ORDER — METOPROLOL SUCCINATE ER 25 MG PO TB24: 25.0000 mg | ORAL_TABLET | Freq: Every day | ORAL | Status: DC

## 2014-09-06 MED ORDER — AMLODIPINE BESYLATE 10 MG PO TABS: 10.0000 mg | ORAL_TABLET | Freq: Every day | ORAL | Status: DC

## 2014-09-06 MED ORDER — FUROSEMIDE 40 MG PO TABS: 40.0000 mg | ORAL_TABLET | Freq: Every day | ORAL | Status: DC

## 2014-09-06 NOTE — Telephone Encounter (Signed)
Patient needs refills for his Pravastatin, Amlodipine, Metoprolol, Benazipril, Furosemide, & Potassium. Dosages confirmed on telephone call. He needs them mailed to our office. Will talk to someone to figure out how this is set up.

## 2014-09-06 NOTE — Telephone Encounter (Signed)
Spoke to patient He request prescriptions sent to [facility] [address] place on file Patient states he does not need all the medication yet- but will contact pharmacy when he is ready. 90 day supply x 3. Patient does not want to use mail order anymore. Patient verbalized understanding.

## 2014-09-06 NOTE — Telephone Encounter (Signed)
Please call,wants to talk to you about his medications.

## 2014-09-06 NOTE — Telephone Encounter (Signed)
Pt says it is suppose to be a 90 days supply for each medicine.

## 2014-09-08 ENCOUNTER — Ambulatory Visit (INDEPENDENT_AMBULATORY_CARE_PROVIDER_SITE_OTHER)
Admission: RE | Admit: 2014-09-08 | Discharge: 2014-09-08 | Disposition: A | Discharge status: Home / Self Care | Payer: Medicare HMO | Source: Ambulatory Visit | Attending: Internal Medicine | Admitting: Internal Medicine

## 2014-09-08 ENCOUNTER — Ambulatory Visit (INDEPENDENT_AMBULATORY_CARE_PROVIDER_SITE_OTHER): Payer: Medicare HMO | Admitting: Internal Medicine

## 2014-09-08 VITALS — BP 142/68 | HR 98 | Temp 97.5°F | Ht 74.0 in | Wt 271.0 lb

## 2014-09-08 DIAGNOSIS — Z0189 Encounter for other specified special examinations: Secondary | ICD-10-CM

## 2014-09-08 DIAGNOSIS — R0600 Dyspnea, unspecified: Secondary | ICD-10-CM

## 2014-09-08 DIAGNOSIS — G250 Essential tremor: Secondary | ICD-10-CM

## 2014-09-08 DIAGNOSIS — I10 Essential (primary) hypertension: Secondary | ICD-10-CM

## 2014-09-08 DIAGNOSIS — R7302 Impaired glucose tolerance (oral): Secondary | ICD-10-CM

## 2014-09-08 DIAGNOSIS — Z0000 Encounter for general adult medical examination without abnormal findings: Secondary | ICD-10-CM

## 2014-09-08 MED ORDER — ALBUTEROL SULFATE HFA 108 (90 BASE) MCG/ACT IN AERS: 2.0000 | INHALATION_SPRAY | Freq: Two times a day (BID) | RESPIRATORY_TRACT | Status: DC

## 2014-09-08 NOTE — Assessment & Plan Note (Signed)
stable overall by history and exam, recent data reviewed with pt, and pt to continue medical treatment as before,  to f/u any worsening symptoms or concerns BP Readings from Last 3 Encounters:  09/08/14 142/68  07/05/14 132/68  05/02/14 134/72

## 2014-09-08 NOTE — Progress Notes (Signed)
Subjective:    Patient ID: [name], male    DOB: 11/07/1945, 69 y.o.   MRN: [id number]  HPI  Here to f/u, c/o ongoing sob despite symbicort; overall more freq, intermittent, mild to mod, occas feels severe to him, exertion does not seem to make better or worse as he has similar problem at rest, no fever, ST, no cough, Pt denies chest pain, wheezing, orthopnea, PND, increased LE swelling, palpitations, dizziness or syncope. Did  Have some chest congestion about 1 mo ago, but lasted a few wks and resolved.   Last cxr/CT 2012  Neg for acute.  Last echo oct 2013 with EF > 55%. Last cath 2003 with essentially normal Cors.  Pt denies new neurological symptoms such as new headache, or facial or extremity weakness or numbness   Pt denies polydipsia, polyuria. Also having worsening tremor, taking metoprolol, not working as well recently it seems.   Past Medical History  Diagnosis Date  . PAF (paroxysmal atrial fibrillation)     Rhythm controlled on Amiodarone.  [facility]on Anticoagulation  . Hypertension   . History of sick sinus syndrome     reduced BB dose for Bradycardia  . HLD (hyperlipidemia)   . Diabetes mellitus type 2, controlled   . COPD (chronic obstructive pulmonary disease)   . BPH (benign prostatic hyperplasia)   . History of prostate cancer     Dr. Karsten Ro  . ED (erectile dysfunction)     s/p Penile prosthesis (09/2010)  . Nephrolithiasis   . Diverticulitis of colon   . Colon polyps   . Complex renal cyst 12/2010  . Chronic LBP     Hip & Back -- Sees Dr. Maia Petties @ [place]  . Osteoarthritis of both hips   . Osteoarthritis of lumbosacral spine     with Disc Disease  . Paresthesias/numbness     Bilateral LE  . Alcoholism in recovery   . Adenoma 05/2008  . Diabetes mellitus with neuropathy 02/11/2007    Qualifier: Diagnosis of  By: Jenny Reichmann MD, Hunt Oris   . Impaired glucose tolerance 12/21/2013  . Peripheral neuropathy 12/21/2013   Past Surgical History  Procedure  Laterality Date  . Prostatectomy    . Penile prosthesis placement  09/2010  . US echocardiography  04/2012    EF of 55%; Mild LVH; no significant valve disease,  Trace MR; RVSP was minimally elevated at 33  . Penile prosthesis implant  06/21/2012    Procedure: PENILE PROTHESIS INFLATABLE;  Surgeon: Claybon Jabs, MD;  Location: [facility];  Service: Urology;  Laterality: N/A;  REMOVAL AND REPLACEMENT OF SOME  OF PROSTHESIS (AMS)   . Nm myoview ltd  2010    No Ischemia or Infarct  . Cardiac catheterization  2003    Normal Coronary Arteries.    reports that he quit smoking about 31 years ago. His smoking use included Cigarettes. He has never used smokeless tobacco. He reports that he drinks alcohol. He reports that he does not use illicit drugs. family history includes Coronary artery disease in his father and mother; Diabetes in his father; Prostate cancer in his father. Allergies  Allergen Reactions  . Ciprofloxacin Other (See Comments)    All over weakness   Current Outpatient Prescriptions on File Prior to Visit  Medication Sig Dispense Refill  . albuterol (PROVENTIL HFA) 108 (90 BASE) MCG/ACT inhaler Inhale 2 puffs into the lungs every 12 (twelve) hours. 3 Inhaler 3  . amiodarone (PACERONE)  200 MG tablet Take 1 tablet by mouth  daily 90 tablet 2  . amLODipine (NORVASC) 10 MG tablet Take 1 tablet (10 mg total) by mouth daily. 90 tablet 3  . aspirin 81 MG EC tablet Take 81 mg by mouth daily.      . benazepril (LOTENSIN) 40 MG tablet Take 1 tablet (40 mg total) by mouth daily. 90 tablet 3  . budesonide-formoterol (SYMBICORT) 160-4.5 MCG/ACT inhaler Inhale 2 puffs into the lungs 2 (two) times daily. 1 Inhaler 12  . furosemide (LASIX) 40 MG tablet Take 1 tablet (40 mg total) by mouth daily. Alternate 1/2 to 1 tablet daily 135 tablet 3  . ibuprofen (ADVIL,MOTRIN) 200 MG tablet Take 200 mg by mouth as directed.      . lactase (LACTAID) 3000 UNITS tablet Take 1 tablet by mouth  3 (three) times daily as needed.      . metoprolol succinate (TOPROL XL) 25 MG 24 hr tablet Take 1 tablet (25 mg total) by mouth daily. 90 tablet 3  . potassium chloride SA (K-DUR,KLOR-CON) 20 MEQ tablet Take 1 tablet by mouth  daily 90 tablet 3  . pravastatin (PRAVACHOL) 40 MG tablet Take 1 tablet (40 mg total) by mouth daily. 90 tablet 3   No current facility-administered medications on file prior to visit.   Review of Systems  Constitutional: Negative for unusual diaphoresis or other sweats  HENT: Negative for ringing in ear Eyes: Negative for double vision or worsening visual disturbance.  Respiratory: Negative for choking and stridor.   Gastrointestinal: Negative for vomiting or other signifcant bowel change Genitourinary: Negative for hematuria or decreased urine volume.  Musculoskeletal: Negative for other MSK pain or swelling Skin: Negative for color change and worsening wound.  Neurological: Negative for tremors and numbness other than noted  Psychiatric/Behavioral: Negative for decreased concentration or agitation other than above       Objective:   Physical Exam BP 142/68 mmHg  Pulse 98  Temp(Src) 97.5 F (36.4 C) (Oral)  Ht 6\' 2"  (1.88 m)  Wt 271 lb (122.925 kg)  BMI 34.78 kg/m2  SpO2 96% VS noted,  Constitutional: Pt appears well-developed, well-nourished.  HENT: Head: NCAT.  Right Ear: External ear normal.  Left Ear: External ear normal.  Eyes: . Pupils are equal, round, and reactive to light. Conjunctivae and EOM are normal Neck: Normal range of motion. Neck supple.  Cardiovascular: Normal rate and regular rhythm.   Pulmonary/Chest: Effort normal and breath sounds without rales or wheezing.  Neurological: Pt is alert. Not confused , motor grossly intact Skin: Skin is warm. No rash Psychiatric: Pt behavior is normal. No agitation.     Assessment & Plan:

## 2014-09-08 NOTE — Assessment & Plan Note (Signed)
Asymtp, for f/u labs next vist, for wt loss, exercise, diet

## 2014-09-08 NOTE — Progress Notes (Signed)
Pre visit review using our clinic review tool, if applicable. No additional management support is needed unless otherwise documented below in the visit note. 

## 2014-09-08 NOTE — Patient Instructions (Addendum)
OK to hold off on using the symbicort to see if stopping makes a difference  Please continue all other medications as before, and refills have been done if requested - the albuterol  Please have the pharmacy call with any other refills you may need.  Please keep your appointments with your specialists as you may have planned  You will be contacted regarding the referral for: Pulmonary Function Testing  Please go to the XRAY Department in the Basement (go straight as you get off the elevator) for the x-ray testing  Please call if you would want the referral to pulmonary at any time  You will be contacted by phone if any changes need to be made immediately.  Otherwise, you will receive a letter about your results with an explanation, but please check with MyChart first.  Please remember to sign up for MyChart if you have not done so, as this will be important to you in the future with finding out test results, communicating by private email, and scheduling acute appointments online when needed.  Please return in 6 months, or sooner if needed, with Lab testing done 3-5 days before

## 2014-09-08 NOTE — Assessment & Plan Note (Addendum)
Persistent complaiint despite symbicort trial, etiology unclear, for cxr, pft's, ok to hold the symbicort further, ok for albut MDI prn,  to f/u any worsening symptoms or concerns  Declines pulm referral

## 2014-09-08 NOTE — Assessment & Plan Note (Signed)
To consider incr BB after off amio per cardiology,  to f/u any worsening symptoms or concerns

## 2014-09-12 ENCOUNTER — Encounter: Payer: Self-pay | Admitting: Internal Medicine

## 2014-10-02 ENCOUNTER — Telehealth: Payer: Self-pay | Admitting: Cardiology

## 2014-10-02 NOTE — Telephone Encounter (Signed)
Calcified plaquing the aorta so that it is not uncommon to see in a 69 year old patient. It is simply a sign of atherosclerosis. It is indeed suddenly went about as far as stroke, which is why we worry about things like cholesterol high blood pressure. Control the serous factors is the main way of treating atherosclerosis plaque regardless of the location.  In & Of itself, this would not explain the shortness of breath.Marland Kitchen  [name]

## 2014-10-02 NOTE — Telephone Encounter (Signed)
Spoke to patient\information given to patient Verbalized understanding.

## 2014-10-02 NOTE — Telephone Encounter (Signed)
Pt called in wanting to speak with Dr. Ellyn Hack about the diagnosis he received from the chest x-ray ordered by his PCP. Please call pt back  Thanks

## 2014-10-02 NOTE — Telephone Encounter (Signed)
Patient was concerned with results from chest x ray ordered by primary Patient wanted to know from Dr Ellyn Hack - is it something he should be concerned "calicified plaque over the aortic arch"  1 - is he more susceptible for stroke   2 - could this be possible cause of breathing issue he discuss with Dr Ellyn Hack.  RN informed Patient will send information - Dr Ellyn Hack -when he returns to office

## 2014-10-17 ENCOUNTER — Telehealth: Payer: Self-pay | Admitting: Internal Medicine

## 2014-10-17 ENCOUNTER — Other Ambulatory Visit: Payer: Self-pay

## 2014-10-17 MED ORDER — ALBUTEROL SULFATE HFA 108 (90 BASE) MCG/ACT IN AERS: INHALATION_SPRAY | RESPIRATORY_TRACT | Status: DC

## 2014-10-17 NOTE — Telephone Encounter (Signed)
Spoke with patient over the phone. Changed the medication for him and sent the next RX to his pharmacy.

## 2014-10-17 NOTE — Telephone Encounter (Signed)
Pt. Wants his inhaler RX rewritten to say "use up to 4 puffs a day as needed" in order for his insurance to cover in and decrease the cost.

## 2014-10-17 NOTE — Telephone Encounter (Signed)
Pt called in and had question about his inhaler.  He thinks the wrong dosage was sent in wrong.  He would like to speak to nurse to see what the correct dosage is.      Best number [phone]

## 2014-10-20 ENCOUNTER — Telehealth: Payer: Self-pay | Admitting: *Deleted

## 2014-10-20 MED ORDER — ALBUTEROL SULFATE HFA 108 (90 BASE) MCG/ACT IN AERS: 1.0000 | INHALATION_SPRAY | Freq: Four times a day (QID) | RESPIRATORY_TRACT | Status: DC | PRN

## 2014-10-20 NOTE — Telephone Encounter (Signed)
Received fax stating received electronic script for Proventil [facility] : [facility] insurance recommends Proair instead. Sent new script fpr Proair...Johny Chess

## 2014-10-30 ENCOUNTER — Ambulatory Visit: Payer: Medicare Other | Admitting: Cardiology

## 2014-11-06 ENCOUNTER — Telehealth: Payer: Self-pay | Admitting: Internal Medicine

## 2014-11-06 MED ORDER — OMEPRAZOLE 20 MG PO CPDR: 20.0000 mg | DELAYED_RELEASE_CAPSULE | Freq: Every day | ORAL | Status: DC | PRN

## 2014-11-06 NOTE — Telephone Encounter (Signed)
Notified pt refill has been sent to [place]...Johny Chess

## 2014-11-06 NOTE — Telephone Encounter (Signed)
Customer is requesting the medication omeprazole (PRILOSEC) 20 MG capsule [id number]. Pharmacy [facility].

## 2014-11-13 ENCOUNTER — Ambulatory Visit (INDEPENDENT_AMBULATORY_CARE_PROVIDER_SITE_OTHER): Payer: Medicare HMO | Admitting: Internal Medicine

## 2014-11-13 DIAGNOSIS — R0600 Dyspnea, unspecified: Secondary | ICD-10-CM

## 2014-11-13 LAB — PULMONARY FUNCTION TEST
DL/VA % pred: 123 %
DL/VA: 5.9 ml/min/mmHg/L
DLCO unc % pred: 86 %
DLCO unc: 31.28 ml/min/mmHg
FEF 25-75 Post: 3.08 L/sec
FEF 25-75 Pre: 3.52 L/sec
FEF2575-%Change-Post: -12 %
FEF2575-%Pred-Post: 107 %
FEF2575-%Pred-Pre: 122 %
FEV1-%Change-Post: -3 %
FEV1-%Pred-Post: 79 %
FEV1-%Pred-Pre: 82 %
FEV1-Post: 2.64 L
FEV1-Pre: 2.74 L
FEV1FVC-%Change-Post: 4 %
FEV1FVC-%Pred-Pre: 108 %
FEV6-%Change-Post: -7 %
FEV6-%Pred-Post: 72 %
FEV6-%Pred-Pre: 78 %
FEV6-Post: 3.02 L
FEV6-Pre: 3.26 L
FEV6FVC-%Pred-Post: 104 %
FEV6FVC-%Pred-Pre: 104 %
FVC-%Change-Post: -7 %
FVC-%Pred-Post: 69 %
FVC-%Pred-Pre: 75 %
FVC-Post: 3.02 L
FVC-Pre: 3.26 L
Post FEV1/FVC ratio: 88 %
Post FEV6/FVC ratio: 100 %
Pre FEV1/FVC ratio: 84 %
Pre FEV6/FVC Ratio: 100 %
RV % pred: 81 %
RV: 2.11 L
TLC % pred: 69 %
TLC: 5.27 L

## 2014-11-13 NOTE — Progress Notes (Signed)
PFT done today. 

## 2014-11-20 ENCOUNTER — Telehealth: Payer: Self-pay | Admitting: Internal Medicine

## 2014-11-20 NOTE — Telephone Encounter (Signed)
Patient needs the inhaler that says ProAir so the insurance will pay for it. Pharmacy is [facility] [address]. Please call patient

## 2014-11-21 ENCOUNTER — Telehealth: Payer: Self-pay | Admitting: Internal Medicine

## 2014-11-21 MED ORDER — ALBUTEROL SULFATE HFA 108 (90 BASE) MCG/ACT IN AERS: 1.0000 | INHALATION_SPRAY | Freq: Four times a day (QID) | RESPIRATORY_TRACT | Status: DC | PRN

## 2014-11-21 NOTE — Telephone Encounter (Signed)
Resent rx for ProAir to [place]...Johny Chess

## 2014-11-24 ENCOUNTER — Telehealth: Payer: Self-pay | Admitting: *Deleted

## 2014-11-24 MED ORDER — PROAIR HFA 108 (90 BASE) MCG/ACT IN AERS: 1.0000 | INHALATION_SPRAY | Freq: Four times a day (QID) | RESPIRATORY_TRACT | Status: DC | PRN

## 2014-11-24 NOTE — Telephone Encounter (Signed)
Received fax stating albuterol inhaler is not covered by pt insurance plan. Pt insurance covers ProAir requesting inhaler to be change. Sent rx for [employer]...Johny Chess

## 2014-11-30 ENCOUNTER — Telehealth: Payer: Self-pay | Admitting: Internal Medicine

## 2014-11-30 NOTE — Telephone Encounter (Signed)
Patient is calling regarding his breathing test he had in pulmonary. Please call patient with results at [phone]

## 2014-12-19 ENCOUNTER — Telehealth: Payer: Self-pay | Admitting: Internal Medicine

## 2014-12-19 NOTE — Telephone Encounter (Signed)
Please advise on results in PCP's absence, thanks

## 2014-12-19 NOTE — Telephone Encounter (Signed)
Pt called in and wants the results of his PFT test that he had last month    Best number [phone]

## 2014-12-20 NOTE — Telephone Encounter (Signed)
Pt advised and would rather Dr Jenny Reichmann review result and advise if ROV is needed. Holding phone note for PCP

## 2014-12-20 NOTE — Telephone Encounter (Signed)
PFT's show "restrictive lung disease". It could be due to obesity or other problems. OV w/Dr Jenny Reichmann next week to discuss Thx

## 2014-12-22 NOTE — Telephone Encounter (Signed)
I agree with the interpretation, and since his most recent cxr did not show pulmonary disease (such as scarring for some reason), the treatment is weight loss if possible (to sort of take the "weight off" the chest and allow better breathing;  This is very common to most persons who have overweight and can be reversed

## 2014-12-26 NOTE — Telephone Encounter (Signed)
Pt advised and expressed understanding.

## 2015-01-02 ENCOUNTER — Telehealth: Payer: Self-pay | Admitting: Internal Medicine

## 2015-01-02 MED ORDER — BUDESONIDE-FORMOTEROL FUMARATE 160-4.5 MCG/ACT IN AERO: 2.0000 | INHALATION_SPRAY | Freq: Two times a day (BID) | RESPIRATORY_TRACT | Status: DC

## 2015-01-02 NOTE — Telephone Encounter (Signed)
Patient states that he previously changed to [place], but budesonide-formoterol [facility]) 160-4.5 MCG/ACT inhaler [id number] was not sent to [place] on [address]. He requests that it is sent in ASAP

## 2015-01-28 ENCOUNTER — Other Ambulatory Visit: Payer: Self-pay | Admitting: Internal Medicine

## 2015-02-19 ENCOUNTER — Telehealth: Payer: Self-pay

## 2015-02-19 NOTE — Telephone Encounter (Signed)
Call to intro AWV visit prior to apt with Dr. Jenny Reichmann, sounded as if someone answered the phone. Could hear people talking but No one came to the phone.

## 2015-02-21 ENCOUNTER — Telehealth: Payer: Self-pay

## 2015-02-21 NOTE — Telephone Encounter (Signed)
[name] called and stated he can come in prior to apt  On 8/19 at 8:45

## 2015-02-21 NOTE — Telephone Encounter (Signed)
Mr. Gary called and stated he can come in

## 2015-03-08 ENCOUNTER — Telehealth: Payer: Self-pay

## 2015-03-08 NOTE — Telephone Encounter (Signed)
Cancelled AWV for tomorrow and will call back to reschedule / apt was prior to Dr. Jenny Reichmann and canceled Dr. Jenny Reichmann as well

## 2015-03-09 ENCOUNTER — Encounter: Payer: Medicare HMO | Admitting: Internal Medicine

## 2015-03-16 ENCOUNTER — Encounter: Payer: Self-pay | Admitting: Cardiology

## 2015-03-16 ENCOUNTER — Ambulatory Visit (INDEPENDENT_AMBULATORY_CARE_PROVIDER_SITE_OTHER): Payer: Medicare HMO | Admitting: Cardiology

## 2015-03-16 VITALS — BP 132/80 | HR 101 | Ht 73.0 in | Wt 283.0 lb

## 2015-03-16 DIAGNOSIS — E785 Hyperlipidemia, unspecified: Secondary | ICD-10-CM | POA: Diagnosis not present

## 2015-03-16 DIAGNOSIS — I4719 Other supraventricular tachycardia: Secondary | ICD-10-CM

## 2015-03-16 DIAGNOSIS — I480 Paroxysmal atrial fibrillation: Secondary | ICD-10-CM | POA: Diagnosis not present

## 2015-03-16 DIAGNOSIS — I10 Essential (primary) hypertension: Secondary | ICD-10-CM

## 2015-03-16 DIAGNOSIS — R600 Localized edema: Secondary | ICD-10-CM

## 2015-03-16 DIAGNOSIS — E669 Obesity, unspecified: Secondary | ICD-10-CM

## 2015-03-16 DIAGNOSIS — R0600 Dyspnea, unspecified: Secondary | ICD-10-CM | POA: Diagnosis not present

## 2015-03-16 DIAGNOSIS — I471 Supraventricular tachycardia: Secondary | ICD-10-CM

## 2015-03-16 MED ORDER — AMLODIPINE BESYLATE 10 MG PO TABS: 5.0000 mg | ORAL_TABLET | Freq: Every day | ORAL | Status: DC

## 2015-03-16 MED ORDER — METOPROLOL SUCCINATE ER 50 MG PO TB24: 50.0000 mg | ORAL_TABLET | Freq: Every day | ORAL | Status: DC

## 2015-03-16 NOTE — Patient Instructions (Addendum)
CONTINUE TO WALK  MONITOR YOUR HEART RATE   (RANGE SHOULD BE 60 -80 AT REST) AND BLOOD PRESSURE (BELOW 140/ 90) . NOTIFY OFFICE.  DECREASE AMLODIPINE TO 5MG  ( 1/2 TABLET OF 10 MG )  INCREASE METOPROLOL SUCC (TOPROL) 50 MG DAILY.   Your physician wants you to follow-up in  12   Months with DR [name] -30 MIN.APPOINTMENT.  You will receive a reminder letter in the mail two months in advance. If you don't receive a letter, please call our office to schedule the follow-up appointment.

## 2015-03-16 NOTE — Progress Notes (Signed)
PCP: Parker Cower, [name]  Clinic Note: Chief Complaint  Patient presents with  . Follow-up    Patient has been SOB and his right ankle swells.  . Hypertension  . Atrial Fibrillation    HPI: [name] is a 69 y.o. male with a PMH below who presents today for 1 yr f/u of PAF --> formerly on Amiodarone that was stopped @ last visit. Former patient of Dr. Rollene [name].  [name] was last seen on 05/02/2014.  Was doing OK - Amiodarone stopped due to lack of Afib recurrence.  Recent Hospitalizations: n/a  Studies Reviewed: PFTs checked by PCCM   Interval History: he says he is "pretty good". He still has a exertional dyspnea but also can occur at rest. It is mostly exertional but sometimes he can have it just simply sitting there. He is having to use his inhaler more frequently. As far as activity goes, he is most limited by his back pain. He Livalo g and go for walks, but his back is bothersome too much after 3 minutes. He has chronic L4-L5 back pain. As a result he has gained weight from lack of activity and lack of activity has led to increased eating. Reason for increased weight and therefore likely worsening exertional dyspnea. He is chronic right greater than left leg edema that is no different than before. He denies any chest tightness or pressure at rest exertion. He denies any sensation of rapid or irregular heartbeats.  No syncope/near syncope and TIA/amaurosis fugax. No PND, orthopnea. He denies any claudication.  Past Medical History  Diagnosis Date  . PAF (paroxysmal atrial fibrillation)     Rhythm controlled on Amiodarone.  [facility]on Anticoagulation  . Hypertension   . History of sick sinus syndrome     reduced BB dose for Bradycardia  . HLD (hyperlipidemia)   . Diabetes mellitus type 2, controlled   . COPD (chronic obstructive pulmonary disease)   . BPH (benign prostatic hyperplasia)   . History of prostate cancer     [name]  . ED (erectile dysfunction)      s/p Penile prosthesis (09/2010)  . Nephrolithiasis   . Diverticulitis of colon   . Colon polyps   . Complex renal cyst 12/2010  . Chronic LBP     Hip & Back -- Sees Dr. Maia [name] @ [place]  . Osteoarthritis of both hips   . Osteoarthritis of lumbosacral spine     with Disc Disease  . Paresthesias/numbness     Bilateral LE  . Alcoholism in recovery   . Adenoma 05/2008  . Diabetes mellitus with neuropathy 02/11/2007    Qualifier: Diagnosis of  By: [name], [name]   . Impaired glucose tolerance 12/21/2013  . Peripheral neuropathy 12/21/2013    ROS: A comprehensive was performed. Review of Systems  Constitutional: Positive for malaise/fatigue (Lack of energy mostly because of lack of exercise.). Negative for weight loss (weight gain instead of loss).  HENT: Negative for nosebleeds.   Respiratory: Positive for shortness of breath and wheezing (ooccasional). Negative for cough.   Gastrointestinal: Negative for heartburn and melena.  Genitourinary: Negative for hematuria and flank pain.  Musculoskeletal: Positive for back pain (Very much limits his walking and activity.).  Psychiatric/Behavioral:       He seems a bit down because of his lack ability to exercise with back pain. Likely dysthymia.  All other systems reviewed and are negative.   Past Surgical History  Procedure Laterality Date  .  Prostatectomy    . Penile prosthesis placement  09/2010  . US echocardiography  04/2012    EF of 55%; Mild LVH; no significant valve disease,  Trace MR; RVSP was minimally elevated at 33  . Penile prosthesis implant  06/21/2012    Procedure: PENILE PROTHESIS INFLATABLE;  Surgeon: [name];  Location: [facility];  Service: Urology;  Laterality: N/A;  REMOVAL AND REPLACEMENT OF SOME  OF PROSTHESIS (AMS)   . Nm myoview ltd  2010    No Ischemia or Infarct  . Cardiac catheterization  2003    Normal Coronary Arteries.   Prior to Admission medications     Medication Sig Start Date End Date Taking? Authorizing Provider  amLODipine (NORVASC) 10 MG tablet Take 1 tablet (10 mg total) by mouth daily. 09/06/14  Yes Parker Man, [name]  aspirin 81 MG EC tablet Take 81 mg by mouth daily.     Yes Historical Provider, [name]  benazepril (LOTENSIN) 40 MG tablet Take 1 tablet (40 mg total) by mouth daily. 09/06/14  Yes Parker Man, [name]  furosemide (LASIX) 40 MG tablet Take 1 tablet (40 mg total) by mouth daily. Alternate 1/2 to 1 tablet daily 09/06/14 03/25/16 Yes Parker Man, [name]  ibuprofen (ADVIL,MOTRIN) 200 MG tablet Take 200 mg by mouth as directed.     Yes Historical Provider, [name]  lactase (LACTAID) 3000 UNITS tablet Take 1 tablet by mouth 3 (three) times daily as needed.     Yes Historical Provider, [name]  metoprolol succinate (TOPROL XL) 25 MG 24 hr tablet Take 1 tablet (25 mg total) by mouth daily. 09/06/14  Yes Parker Man, [name]  omeprazole (PRILOSEC) 20 MG capsule TAKE ONE CAPSULE BY MOUTH ONCE DAILY AS NEEDED 01/29/15  Yes Parker Borg, [name]  potassium chloride SA (K-DUR,KLOR-CON) 20 MEQ tablet Take 1 tablet by mouth  daily 09/06/14  Yes Parker Man, [name]  pravastatin (PRAVACHOL) 40 MG tablet Take 1 tablet (40 mg total) by mouth daily. 09/06/14  Yes Parker Man, [name]  PROAIR HFA 108 [phone] BASE) MCG/ACT inhaler Inhale 1-2 puffs into the lungs every 6 (six) hours as needed for wheezing or shortness of breath. 11/24/14  Yes Parker Borg, [name]   Allergies  Allergen Reactions  . Ciprofloxacin Other (See Comments)    All over weakness     Social History   Social History  . Marital Status: Single    Spouse Name: N/A  . Number of Children: 3  . Years of Education: N/A   Occupational History  . Retired    Social History Main Topics  . Smoking status: Former Smoker    Types: Cigarettes    Quit date: 11/04/1982  . Smokeless tobacco: Never Used  . Alcohol Use: Yes     Comment: no alcohol x 1 week wants to stop  . Drug Use: No     Comment: use to smoke  marijuana  . Sexual Activity: Not Asked   Other Topics Concern  . 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   Divorced. 3 grown children. 4 GC.     Quit smoking ~20+ yrs ago.    Family History  Problem Relation Age of Onset  . Coronary artery disease Mother   . Coronary artery disease Father   . Prostate cancer Father   . Diabetes Father     Wt  Readings from Last 3 Encounters:  03/16/15 283 lb (128.368 kg)  09/08/14 271 lb (122.925 kg)  07/05/14 269 lb (122.018 kg)    PHYSICAL EXAM BP 132/80 mmHg  Pulse 101  Ht 6\' 1"  (1.854 m)  Wt 283 lb (128.368 kg)  BMI 37.35 kg/m2 General appearance: alert, cooperative, appears stated age, no distress and Healthy-appearing. Normal mood and affect.  HEENT: [place]/AT, EOMI, MMM, anicteric sclera Neck: no adenopathy, no carotid bruit, no JVD and supple, symmetrical, trachea midline  Lungs:CTAB, normal percussion bilaterally and Nonlabored, and a movement  Heart: tachycardic with possible soft S4 normal S1 and S2. Also soft SEM at the RUSB. No other R./G. Nondisplaced PMI.  Abdomen: soft, non-tender; bowel sounds normal; no masses, no organomegaly  Extremities: extremities normal, atraumatic, no cyanosis or edema  Pulses: 2+ and symmetric  Neurologic: Alert and oriented X 3, normal strength and tone. Normal symmetric reflexes. Normal coordination and gait is slow with kyphosis    Adult ECG Report  Rate: 101 ;  Rhythm: Difficult to determine, but appeared to be accelerated junctional rhythm with P waves noted after QRS complexes. There is left axis deviation with poor R-wave progression. LVH. Lateral T wave inversion.;   Narrative Interpretation: most notable change from last evaluation as the increased heart rate with apparent accelerated junctional rhythm.   Other studies Reviewed: Additional studies/ records that were reviewed today include:  Recent Labs:  No new  labs available     ASSESSMENT / PLAN: Problem List Items Addressed This Visit    Dyspnea    Not sure what if any cardiac etiology could be explaining his dyspnea. Certainly given the rate under control ithiss appropriate. I think a lot of his Sx are related to deconditioning and weight gain.      Relevant Orders   EKG 12-Lead   Essential hypertension (Chronic)    Overall stable. He should go tolerate increased dose of Toprol to 50 mg daily -- to compensate, and reduce amlodipine to 5 mg daily.      Relevant Medications   metoprolol succinate (TOPROL-XL) 50 MG 24 hr tablet   amLODipine (NORVASC) 10 MG tablet   Other Relevant Orders   EKG 12-Lead   Hyperlipidemia with target LDL less than 100 (Chronic)    On pravastatin. Monitored by PCP.      Relevant Medications   metoprolol succinate (TOPROL-XL) 50 MG 24 hr tablet   amLODipine (NORVASC) 10 MG tablet   Other Relevant Orders   EKG 12-Lead   Junctional tachycardia    Not symptomatic, but with LVH and dyspnea, he simply needs better rate control. Increase Toprol to 50 mg daily. I would like to have him get a repeat EKG. However he would prefer not to have to come back for another co-pay. I think checking his heart rate at home is reasonable. We reviewed how to check his pulses.      Relevant Medications   metoprolol succinate (TOPROL-XL) 50 MG 24 hr tablet   amLODipine (NORVASC) 10 MG tablet   Obesity (BMI 30-39.9) (Chronic)    Vicious cycle with back pain leading to decreased exercise. Unfortunately in addition to lack of exercise, he is also increased eating. He has gained back the previous loss. Needs to monitor dietary intake      PAF (paroxysmal atrial fibrillation) - now off amiodarone - Primary (Chronic)    He doesn't seem to be having issues with a thin, but currently is tachycardic and appeared to be a  junctional tachycardia. With his dyspnea and underlying lung issues, I think being off amiodarone is probably  prudent. We'll simply need to titrate up his beta blocker dose for better rate control. This may also help his tremors.      Relevant Medications   metoprolol succinate (TOPROL-XL) 50 MG 24 hr tablet   amLODipine (NORVASC) 10 MG tablet   Other Relevant Orders   EKG 12-Lead    Other Visit Diagnoses    Edema of both legs        Relevant Orders    EKG 12-Lead       Current medicines are reviewed at length with the patient today. (+/- concerns) n/a The following changes have been made: n/a Studies Ordered:   Orders Placed This Encounter  Procedures  . EKG 12-Lead   CONTINUE TO WALK  MONITOR YOUR HEART RATE   (RANGE SHOULD BE 60 -80 AT REST) AND BLOOD PRESSURE (BELOW 140/ 90) . NOTIFY OFFICE.  DECREASE AMLODIPINE TO 5MG  ( 1/2 TABLET OF 10 MG )  INCREASE METOPROLOL SUCC (TOPROL) 50 MG DAILY.   Your physician wants you to follow-up in  12   Months with DR [name] -30 MIN.APPOINTMENT.    [name], M.D., M.S. Interventional Cardiologist   Pager # [phone]

## 2015-03-18 ENCOUNTER — Encounter: Payer: Self-pay | Admitting: Cardiology

## 2015-03-18 DIAGNOSIS — I471 Supraventricular tachycardia: Secondary | ICD-10-CM | POA: Insufficient documentation

## 2015-03-18 NOTE — Assessment & Plan Note (Addendum)
Not symptomatic, but with LVH and dyspnea, he simply needs better rate control. Increase Toprol to 50 mg daily. I would like to have him get a repeat EKG. However he would prefer not to have to come back for another co-pay. I think checking his heart rate at home is reasonable. We reviewed how to check his pulses.

## 2015-03-18 NOTE — Assessment & Plan Note (Signed)
On pravastatin. Monitored by PCP.

## 2015-03-18 NOTE — Assessment & Plan Note (Addendum)
Overall stable. He should go tolerate increased dose of Toprol to 50 mg daily -- to compensate, and reduce amlodipine to 5 mg daily.

## 2015-03-18 NOTE — Assessment & Plan Note (Signed)
Not sure what if any cardiac etiology could be explaining his dyspnea. Certainly given the rate under control ithiss appropriate. I think a lot of his Sx are related to deconditioning and weight gain.

## 2015-03-18 NOTE — Assessment & Plan Note (Signed)
He doesn't seem to be having issues with a thin, but currently is tachycardic and appeared to be a junctional tachycardia. With his dyspnea and underlying lung issues, I think being off amiodarone is probably prudent. We'll simply need to titrate up his beta blocker dose for better rate control. This may also help his tremors.

## 2015-03-18 NOTE — Assessment & Plan Note (Signed)
Vicious cycle with back pain leading to decreased exercise. Unfortunately in addition to lack of exercise, he is also increased eating. He has gained back the previous loss. Needs to monitor dietary intake

## 2015-04-16 ENCOUNTER — Telehealth: Payer: Self-pay | Admitting: Cardiology

## 2015-04-16 MED ORDER — AMLODIPINE BESYLATE 5 MG PO TABS: 5.0000 mg | ORAL_TABLET | Freq: Every day | ORAL | Status: DC

## 2015-04-16 NOTE — Telephone Encounter (Signed)
Refill submitted to patient's preferred pharmacy. Informed patient. Pt voiced understanding, no other stated concerns at this time.  

## 2015-04-16 NOTE — Telephone Encounter (Signed)
°  1. Which medications need to be refilled? Amlodipine-new prescription for 5 mg-does he still want to change it to 5mg ?  2. Which pharmacy is medication to be sent to?[facility]- did not know the phone number 3. Do they need a 30 day or 90 day supply? 90 and refills  4. Would they like a call back once the medication has been sent to the pharmacy? yes

## 2015-07-04 ENCOUNTER — Telehealth: Payer: Self-pay | Admitting: Cardiology

## 2015-07-04 NOTE — Telephone Encounter (Signed)
Patient would like to go to Cardiac Rehab. Patient is wanting to start an exercise program, but he is afraid to go to [facility] or something similar. Patient wants the security of health care professions around when he is exercising. Informed patient that his message would be sent to Dr. Ellyn Hack to see if he qualifies for Cardiac Rehab.

## 2015-07-04 NOTE — Telephone Encounter (Signed)
Please call,he wants to know about getting enrolled in Cardiac Rehab.

## 2015-07-06 NOTE — Telephone Encounter (Signed)
Spoke to patient  He states wanted statr some kind of program. He become short of breath at times just sitting without activity.  patient states he has had work up for lungs - no problems noted. Patient aware Dr Ellyn Hack is out of the office will contact him With answer once Dr Ellyn Hack returns. Patient aware.

## 2015-07-06 NOTE — Telephone Encounter (Signed)
°  Follow Up   Pt is calling to follow up on mess from 12/14 regarding cardiac rehab. Please call.

## 2015-07-09 NOTE — Telephone Encounter (Signed)
Called patient back with Dr. Allison Quarry recommendations. Patient verbalized understanding. Will send message to schedulers to see if patient can get an earlier appointment than the end of January.

## 2015-07-09 NOTE — Telephone Encounter (Signed)
Should probably be seen to see if these Sx are new or worsening. May need ischemic work-up - especially prior to referral to an exercise program.  Ellyn Hack, Leonie Green, MD

## 2015-07-22 ENCOUNTER — Other Ambulatory Visit: Payer: Self-pay | Admitting: Internal Medicine

## 2015-08-21 ENCOUNTER — Ambulatory Visit: Payer: Medicare HMO | Admitting: Cardiology

## 2015-09-16 ENCOUNTER — Other Ambulatory Visit: Payer: Self-pay | Admitting: Cardiology

## 2015-09-17 NOTE — Telephone Encounter (Signed)
Rx(s) sent to pharmacy electronically.  

## 2015-09-30 ENCOUNTER — Other Ambulatory Visit: Payer: Self-pay | Admitting: Cardiology

## 2015-10-11 ENCOUNTER — Telehealth: Payer: Self-pay | Admitting: Internal Medicine

## 2015-10-11 NOTE — Telephone Encounter (Signed)
Please contact patient in regards to wellness visit.

## 2015-10-14 ENCOUNTER — Other Ambulatory Visit: Payer: Self-pay | Admitting: Internal Medicine

## 2015-10-14 ENCOUNTER — Other Ambulatory Visit: Payer: Self-pay | Admitting: Cardiology

## 2015-10-17 ENCOUNTER — Telehealth: Payer: Self-pay

## 2015-10-17 NOTE — Telephone Encounter (Signed)
Basket note to call [name] regarding AWV. Mr Irizarry has been scheduled after his apt with Dr. Jenny Reichmann on 5/12 at 9:30 for AWV at 10am

## 2015-10-19 ENCOUNTER — Telehealth: Payer: Self-pay

## 2015-10-19 NOTE — Telephone Encounter (Signed)
LVM for pt to call back as soon as possible.   RE: Flu Vaccine for this flu season.   

## 2015-11-30 ENCOUNTER — Encounter: Payer: Medicare HMO | Admitting: Internal Medicine

## 2015-11-30 DIAGNOSIS — Z0289 Encounter for other administrative examinations: Secondary | ICD-10-CM

## 2015-12-16 ENCOUNTER — Other Ambulatory Visit: Payer: Self-pay | Admitting: Cardiology

## 2015-12-23 ENCOUNTER — Other Ambulatory Visit: Payer: Self-pay | Admitting: Cardiology

## 2016-01-20 ENCOUNTER — Other Ambulatory Visit: Payer: Self-pay | Admitting: Internal Medicine

## 2016-01-24 NOTE — Telephone Encounter (Signed)
Missed his appt with PCP

## 2016-01-27 ENCOUNTER — Other Ambulatory Visit: Payer: Self-pay | Admitting: Internal Medicine

## 2016-02-13 ENCOUNTER — Telehealth: Payer: Self-pay

## 2016-02-13 NOTE — Telephone Encounter (Signed)
Patient called and states that his app he missed on May 12 he got a no show fee. He states that he called that morning to cancel that app. He states he has been a patient for 30years and this has never happen before. He would like to know if something could be done about this $50 fee. Can you please follow up, Thank you.

## 2016-02-19 NOTE — Telephone Encounter (Signed)
Emailed billing to void patient No Show fee for [facility] 11/30/15 as a one time courtesy. Pt informed and I reminded him that cancellations must be 24 hours in advance to avoid this fee in the future, pt understood.

## 2016-02-24 ENCOUNTER — Other Ambulatory Visit: Payer: Self-pay | Admitting: Cardiology

## 2016-02-25 NOTE — Telephone Encounter (Signed)
Rx(s) sent to pharmacy electronically.  

## 2016-03-02 ENCOUNTER — Other Ambulatory Visit: Payer: Self-pay | Admitting: Cardiology

## 2016-03-16 ENCOUNTER — Other Ambulatory Visit: Payer: Self-pay | Admitting: Internal Medicine

## 2016-03-16 ENCOUNTER — Other Ambulatory Visit: Payer: Self-pay | Admitting: Cardiology

## 2016-03-17 NOTE — Telephone Encounter (Signed)
Rx request sent to pharmacy.  

## 2016-03-21 HISTORY — PX: NM MYOVIEW LTD: HXRAD82

## 2016-03-25 DIAGNOSIS — H2513 Age-related nuclear cataract, bilateral: Secondary | ICD-10-CM | POA: Diagnosis not present

## 2016-03-25 DIAGNOSIS — H40013 Open angle with borderline findings, low risk, bilateral: Secondary | ICD-10-CM | POA: Diagnosis not present

## 2016-04-04 ENCOUNTER — Ambulatory Visit (INDEPENDENT_AMBULATORY_CARE_PROVIDER_SITE_OTHER): Payer: Medicare HMO | Admitting: Cardiology

## 2016-04-04 ENCOUNTER — Encounter: Payer: Self-pay | Admitting: Cardiology

## 2016-04-04 VITALS — BP 130/88 | HR 99 | Ht 73.0 in | Wt 289.0 lb

## 2016-04-04 DIAGNOSIS — I480 Paroxysmal atrial fibrillation: Secondary | ICD-10-CM | POA: Diagnosis not present

## 2016-04-04 DIAGNOSIS — R0609 Other forms of dyspnea: Secondary | ICD-10-CM

## 2016-04-04 DIAGNOSIS — I10 Essential (primary) hypertension: Secondary | ICD-10-CM | POA: Diagnosis not present

## 2016-04-04 DIAGNOSIS — I872 Venous insufficiency (chronic) (peripheral): Secondary | ICD-10-CM | POA: Insufficient documentation

## 2016-04-04 DIAGNOSIS — R0600 Dyspnea, unspecified: Secondary | ICD-10-CM

## 2016-04-04 DIAGNOSIS — I517 Cardiomegaly: Secondary | ICD-10-CM

## 2016-04-04 DIAGNOSIS — E785 Hyperlipidemia, unspecified: Secondary | ICD-10-CM

## 2016-04-04 DIAGNOSIS — M7989 Other specified soft tissue disorders: Secondary | ICD-10-CM

## 2016-04-04 DIAGNOSIS — E669 Obesity, unspecified: Secondary | ICD-10-CM

## 2016-04-04 MED ORDER — FUROSEMIDE 40 MG PO TABS: 40.0000 mg | ORAL_TABLET | Freq: Every day | ORAL | 0 refills | Status: DC

## 2016-04-04 NOTE — Patient Instructions (Signed)
BOTH TEST WILL BE DONE AT [facility] OFF[facility] [address] Your physician has requested that you have a lexiscan myoview. For further information please visit [email or web address]. Please follow instruction sheet, as given.   SCHEDULE FOR VENOUS INSUFF. AND DVT Your physician has requested that you have a lower or  extremity venous duplex. This test is an ultrasound of the veins in the legs or . It looks at venous blood flow that carries blood from the heart to the legs or arms. Allow one hour for a Lower Venous exam.  There are no restrictions or special instructions.   MEDICATIONS CHANGE   TAKE LASIX 40 MG ONE TABLET DAILY BY MOUTH  Your physician recommends that you schedule a follow-up appointment in: DR [name] AFTER TEST ARE COMPLETED - 30 MIN.

## 2016-04-04 NOTE — Progress Notes (Signed)
PCP: Cathlean Cower, MD  Clinic Note: Chief Complaint  Patient presents with  . Follow-up    12 months.  . Shortness of Breath    With exertion  . Leg Swelling    R>>>>L - increased form normal    HPI: [name] is a 70 y.o. male with a PMH below who presents today for 1 yr f/u of PAF --> formerly on Amiodarone that was stopped @ last visit.  He has chronic L4-L5 back pain that limits exercise.He has chronic right> left lower extremity edema Former patient of Dr. Rollene Fare. As result of financial concerns with him being retired and essentially out of his money reserves, he is hoping to avoid any excessive evaluation.  [name] was last seen on 02/2015  Was doing OK - Amiodarone stopped due to lack of Afib recurrence.  Recent Hospitalizations: n/a  Studies Reviewed:n/a  Interval History: He says he is "pretty good".   Couple concerns:  R>> L LE swelling, with a few small sores.  This happens off & on.  A bit larger than last time.  Still UOP from Lasix.   Still with some SOB - sometimes @ rest, but mostly with exertion.  Wants to exercise more, but is apprehensive b/c SOB Sx.  Is low but more concerned about this particular point, because he really can't do much. No real irregular beats.  .   [facility] - but is worries about not being monitored - would like to be able to do more monitored rehabilitation setting type of exercise.  Unfortunately, as a result of him not exercising very much, he has continued to gain weight. Some of it may be swelling of the right leg, but he alsonot necessarily limited eating as well as he should and is certainly not exercising. He does have exertional dyspnea, but denies any resting or exertional chest tightness or pressure.  He denies any sensation of rapid or irregular heartbeats.   No syncope/near syncope and TIA/amaurosis fugax.  No PND, orthopnea. He denies any claudication.  ROS: A comprehensive was  performed. Review of Systems  Constitutional: Positive for malaise/fatigue (Lack of energy mostly because of lack of exercise.). Negative for weight loss (weight gain instead of loss).  HENT: Negative for nosebleeds.   Respiratory: Positive for shortness of breath and wheezing (ooccasional). Negative for cough.   Cardiovascular: Positive for leg swelling (R>>>L). Negative for chest pain.  Gastrointestinal: Negative for heartburn and melena.  Genitourinary: Negative for flank pain and hematuria.  Musculoskeletal: Positive for back pain (Very much limits his walking and activity.) and joint pain (Hips, knees and ankles). Negative for falls.  Neurological: Negative.  Negative for dizziness.  Endo/Heme/Allergies: Does not bruise/bleed easily.  Psychiatric/Behavioral: Negative for depression and memory loss. The patient is not nervous/anxious and does not have insomnia.        He seems a bit down because of his lack ability to exercise with back pain. Likely dysthymia.  All other systems reviewed and are negative.  Past Medical History:  Diagnosis Date  . Adenoma 05/2008  . Alcoholism in recovery ([place])   . BPH (benign prostatic hyperplasia)   . Chronic LBP    Hip & Back -- Sees Dr. Maia Petties @ [facility]  . Colon polyps   . Complex renal cyst 12/2010  . COPD (chronic obstructive pulmonary disease) ([place])   . Diabetes mellitus type 2, controlled ([place])   . Diabetes mellitus with neuropathy ([place]) 02/11/2007  Qualifier: Diagnosis of  By: Jenny Reichmann MD, Hunt Oris   . Diverticulitis of colon   . ED (erectile dysfunction)    s/p Penile prosthesis (09/2010)  . History of prostate cancer    Dr. Karsten Ro  . History of sick sinus syndrome    reduced BB dose for Bradycardia  . HLD (hyperlipidemia)   . Hypertension   . Impaired glucose tolerance 12/21/2013  . Nephrolithiasis   . Osteoarthritis of both hips   . Osteoarthritis of lumbosacral spine    with Disc Disease  . PAF (paroxysmal atrial  fibrillation) ([facility])    Rhythm controlled on Amiodarone.  [facility] Anticoagulation  . Paresthesias/numbness    Bilateral LE  . Peripheral neuropathy ([place]) 12/21/2013    Past Surgical History:  Procedure Laterality Date  . CARDIAC CATHETERIZATION  2003   Normal Coronary Arteries.  Marland Kitchen NM MYOVIEW LTD  2010   No Ischemia or Infarct  . PENILE PROSTHESIS IMPLANT  06/21/2012   Procedure: PENILE PROTHESIS INFLATABLE;  Surgeon: Claybon Jabs, MD;  Location: [facility];  Service: Urology;  Laterality: N/A;  REMOVAL AND REPLACEMENT OF SOME  OF PROSTHESIS ([facility])   . PENILE PROSTHESIS PLACEMENT  09/2010  . PROSTATECTOMY    . US ECHOCARDIOGRAPHY  04/2012   EF of 55%; Mild LVH; no significant valve disease,  Trace MR; RVSP was minimally elevated at 33    Prior to Admission medications   Medication Sig Start Date End Date Taking? Authorizing Provider  amLODipine (NORVASC) 5 MG tablet Take 1 tablet (5 mg total) by mouth daily. 04/16/15  Yes Leonie Man, MD  aspirin 81 MG EC tablet Take 81 mg by mouth daily.     Yes Historical Provider, MD  benazepril (LOTENSIN) 40 MG tablet TAKE ONE TABLET BY MOUTH ONCE DAILY 10/15/15  Yes Leonie Man, MD  furosemide (LASIX) 40 MG tablet TAKE ONE TABLET BY MOUTH ONCE DAILY. ALTERNATE 1/2 TO 1 TABLET DAILY. 02/25/16  Yes Leonie Man, MD  ibuprofen (ADVIL,MOTRIN) 200 MG tablet Take 200 mg by mouth as directed.     Yes Historical Provider, MD  KLOR-CON M20 20 MEQ tablet TAKE ONE TABLET BY MOUTH ONCE DAILY 03/17/16  Yes Leonie Man, MD  lactase (LACTAID) 3000 UNITS tablet Take 1 tablet by mouth 3 (three) times daily as needed.     Yes Historical Provider, MD  metoprolol succinate (TOPROL-XL) 50 MG 24 hr tablet Take 1 tablet (50 mg total) by mouth daily. Take with or immediately following a meal. 03/16/15  Yes Leonie Man, MD  omeprazole (PRILOSEC) 20 MG capsule TAKE ONE CAPSULE BY MOUTH ONCE DAILY AS NEEDED 01/21/16  Yes Biagio Borg, MD  pravastatin  (PRAVACHOL) 40 MG tablet TAKE ONE TABLET BY MOUTH ONCE DAILY. 10/01/15  Yes Leonie Man, MD  PROAIR HFA 108 [phone] Base) MCG/ACT inhaler INHALE ONE TO TWO PUFFS BY MOUTH EVERY 6 HOURS AS NEEDED FOR WHEEZING OR SHORTNESS OF BREATH 03/17/16  Yes Biagio Borg, MD    Allergies  Allergen Reactions  . Ciprofloxacin Other (See Comments)    All over weakness     Social History   Social History  . Marital status: Single    Spouse name: N/A  . Number of children: 3  . Years of education: N/A   Occupational History  . Retired    Social History Main Topics  . Smoking status: Former Smoker    Types: Cigarettes    Quit date:  11/04/1982  . Smokeless tobacco: Never Used  . Alcohol use Yes     Comment: no alcohol x 1 week wants to stop  . Drug use: No     Comment: use to smoke marijuana  . Sexual activity: Not Asked   Other Topics Concern  . 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   Divorced. 3 grown children. 4 GC.     Quit smoking ~20+ yrs ago.    Family History  Problem Relation Age of Onset  . Coronary artery disease Mother   . Coronary artery disease Father   . Prostate cancer Father   . Diabetes Father     Wt Readings from Last 3 Encounters:  04/04/16 289 lb (131.1 kg)  03/16/15 283 lb (128.4 kg)  09/08/14 271 lb (122.9 kg)    PHYSICAL EXAM BP 130/88   Pulse 99   Ht 6\' 1"  (1.854 m)   Wt 289 lb (131.1 kg)   BMI 38.13 kg/m  General appearance: alert, cooperative, appears stated age, no distress and Healthy-appearing. Normal mood and affect.  HEENT: [place]/AT, EOMI, MMM, anicteric sclera Neck: no adenopathy, no carotid bruit, no JVD and supple, symmetrical, trachea midline  Lungs:CTAB, normal percussion bilaterally and Nonlabored, and a movement  Heart: tachycardic with possible soft S4 normal S1 and S2. Also soft SEM at the RUSB. No other R./G. Nondisplaced PMI.  Abdomen: soft, non-tender; bowel  sounds normal; no masses, no organomegaly  Extremities: extremities normal, atraumatic, no cyanosis or edema  Pulses: 2+ and symmetric  Neurologic: Alert and oriented X 3, normal strength and tone. Normal symmetric reflexes. Normal coordination and gait is slow with kyphosis    Adult ECG Report  Rate: 101 ;  Rhythm: Difficult to determine, but appeared to be accelerated junctional rhythm with P waves noted after QRS complexes - however really probably looks more like sinus bradycardia with prolonged PR interval suggesting prolonged 1 AV block.. The left axis is further deviated to now left anterior vesicular block (-48); with poor R-wave progression. LVH. Lateral T wave inversion.;   Narrative Interpretation: No notable change from last EKG  Other studies Reviewed: Additional studies/ records that were reviewed today include:  Recent Labs:  No new labs available    ASSESSMENT / PLAN: Problem List Items Addressed This Visit    Swelling of lower extremity (Chronic)    Right leg has a notably increased swelling than the left. He has had evaluation long ago for DVT, however I still concern based on the extent of the edema that this could be potentially due to phlebitis or thrombophlebitis. I'm also concerned about simply chronic venous insufficiency/venous hypertension.   Plan:   Check lower extremity venous Dopplers for both DVT and venous valvular insufficiency disease.   Increase Lasix dose to 40 mg every day as opposed to every other day with alternating 20 mg.  Depending on the results of the studies, would consider unilateral compression stocking.      Relevant Orders   EKG 12-Lead   VAS [place] LOWER EXTREMITY VENOUS (DVT)   VENOUS REFLUX   Myocardial Perfusion Imaging   PAF (paroxysmal atrial fibrillation) - now off amiodarone (Chronic)    Maintains what looks like sinus rhythm and sinus tachycardia. I think it simply is a very a very prolonged PR interval. I would not further  titrate beta blocker based on the extent of first degree AV block.  Likely does not. He has any recurrence. As result, he is not on anticoagulation.  This patients CHA2DS2-VASc Score and unadjusted Ischemic Stroke Rate (% per year) is equal to 2.2 % stroke rate/year from a score of 2  Above score calculated as 1 point each if present [CHF, HTN, DM, Vascular=MI/PAD/Aortic Plaque, Age if 56-74, or Male]; 2 points each if present [Age > 75, or Stroke/TIA/TE]      Relevant Medications   furosemide (LASIX) 40 MG tablet   Other Relevant Orders   VAS [place] LOWER EXTREMITY VENOUS (DVT)   VENOUS REFLUX   Myocardial Perfusion Imaging   Obesity (BMI 30-39.9) (Chronic)    Again this is a vicious cycle with his chronic back pain leading to reduced exercise that then relieved stating increased weight. He needs dietary discretion of the major focus. Would also need to consider negativity increase his activity level. Nortriptyline confirm that his exertional dyspnea is not coronary in nature, we are doing a stress test. Provided this is normal, I would recommend water aerobics or water walking is a better option to help with back pain but still get exercise.      Hyperlipidemia with target LDL less than 100 (Chronic)   Relevant Medications   furosemide (LASIX) 40 MG tablet   Exertional dyspnea - Primary    At this point, I think his exertional dyspnea has gotten beyond the point where he is willing to evaluate it. He is definitely concerned. Really has an abnormal EKG, as well as cardiac risk factors of hypertension, hyperlipidemia, obesity with sedentary lifestyle. Certainly could be related to deconditioning, weight gain/obesity as well as some underlying lung disease. However I need to exclude ischemic cardiac symptoms as well as potential CHF mediated symptoms given his edema.  Plan: Lexiscan Myoview (he clearly cannot walk on treadmill)      Essential hypertension (Chronic)    Well-controlled on  current medications. The adjustment to increase Toprol and decrease amlodipine work well.      Relevant Medications   furosemide (LASIX) 40 MG tablet   Other Relevant Orders   EKG 12-Lead   VAS [place] LOWER EXTREMITY VENOUS (DVT)   VENOUS REFLUX   Myocardial Perfusion Imaging    Other Visit Diagnoses   None.     Current medicines are reviewed at length with the patient today. (+/- concerns) n/a  MEDICATIONS CHANGE   TAKE LASIX 40 MG ONE TABLET DAILY BY MOUTH   Studies Ordered:   Orders Placed This Encounter  Procedures  . Myocardial Perfusion Imaging  . EKG 12-Lead   CONTINUE TO WALK   Follow-up: Roughly 1 month, after studies have been performed    Glenetta Hew, M.D., M.S. Interventional Cardiologist   Pager # [phone]

## 2016-04-05 ENCOUNTER — Encounter: Payer: Self-pay | Admitting: Cardiology

## 2016-04-05 ENCOUNTER — Other Ambulatory Visit: Payer: Self-pay | Admitting: Cardiology

## 2016-04-05 NOTE — Assessment & Plan Note (Addendum)
Maintains what looks like sinus rhythm and sinus tachycardia. I think it simply is a very a very prolonged PR interval. I would not further titrate beta blocker based on the extent of first degree AV block. Likely does not. He has any recurrence. As result, he is not on anticoagulation.  This patients CHA2DS2-VASc Score and unadjusted Ischemic Stroke Rate (% per year) is equal to 2.2 % stroke rate/year from a score of 2  Above score calculated as 1 point each if present [CHF, HTN, DM, Vascular=MI/PAD/Aortic Plaque, Age if 65-74, or Male]; 2 points each if present [Age > 75, or Stroke/TIA/TE]

## 2016-04-05 NOTE — Assessment & Plan Note (Signed)
At this point, I think his exertional dyspnea has gotten beyond the point where he is willing to evaluate it. He is definitely concerned. Really has an abnormal EKG, as well as cardiac risk factors of hypertension, hyperlipidemia, obesity with sedentary lifestyle. Certainly could be related to deconditioning, weight gain/obesity as well as some underlying lung disease. However I need to exclude ischemic cardiac symptoms as well as potential CHF mediated symptoms given his edema.  Plan: Lexiscan Myoview (he clearly cannot walk on treadmill)

## 2016-04-05 NOTE — Assessment & Plan Note (Addendum)
Right leg has a notably increased swelling than the left. He has had evaluation long ago for DVT, however I still concern based on the extent of the edema that this could be potentially due to phlebitis or thrombophlebitis. I'm also concerned about simply chronic venous insufficiency/venous hypertension.   Plan:   Check lower extremity venous Dopplers for both DVT and venous valvular insufficiency disease.   Increase Lasix dose to 40 mg every day as opposed to every other day with alternating 20 mg.  Depending on the results of the studies, would consider unilateral compression stocking.

## 2016-04-05 NOTE — Assessment & Plan Note (Signed)
Well-controlled on current medications. The adjustment to increase Toprol and decrease amlodipine work well.

## 2016-04-05 NOTE — Assessment & Plan Note (Signed)
Again this is a vicious cycle with his chronic back pain leading to reduced exercise that then relieved stating increased weight. He needs dietary discretion of the major focus. Would also need to consider negativity increase his activity level. Nortriptyline confirm that his exertional dyspnea is not coronary in nature, we are doing a stress test. Provided this is normal, I would recommend water aerobics or water walking is a better option to help with back pain but still get exercise.

## 2016-04-07 ENCOUNTER — Telehealth: Payer: Self-pay | Admitting: *Deleted

## 2016-04-07 NOTE — Telephone Encounter (Signed)
Left message for patient to call regarding tests ordered by Dr. Ellyn Hack.  Need to give patient dates and times.

## 2016-04-07 NOTE — Telephone Encounter (Signed)
Will mail information to patient and will keep trying to reach via phone

## 2016-04-07 NOTE — Telephone Encounter (Signed)
Left message for patient to call--also informed patient I was mailing information to him

## 2016-04-09 ENCOUNTER — Other Ambulatory Visit: Payer: Self-pay | Admitting: Cardiology

## 2016-04-09 DIAGNOSIS — M7989 Other specified soft tissue disorders: Secondary | ICD-10-CM

## 2016-04-11 ENCOUNTER — Telehealth (HOSPITAL_COMMUNITY): Payer: Self-pay

## 2016-04-11 NOTE — Telephone Encounter (Signed)
Encounter complete. 

## 2016-04-13 ENCOUNTER — Other Ambulatory Visit: Payer: Self-pay | Admitting: Cardiology

## 2016-04-14 NOTE — Telephone Encounter (Signed)
Rx request sent to pharmacy.  

## 2016-04-16 ENCOUNTER — Ambulatory Visit (HOSPITAL_COMMUNITY)
Admission: RE | Admit: 2016-04-16 | Discharge: 2016-04-16 | Disposition: A | Discharge status: Home / Self Care | Payer: Medicare HMO | Source: Ambulatory Visit | Attending: Cardiology | Admitting: Cardiology

## 2016-04-16 DIAGNOSIS — I10 Essential (primary) hypertension: Secondary | ICD-10-CM | POA: Diagnosis not present

## 2016-04-16 DIAGNOSIS — M7989 Other specified soft tissue disorders: Secondary | ICD-10-CM | POA: Diagnosis not present

## 2016-04-16 DIAGNOSIS — I480 Paroxysmal atrial fibrillation: Secondary | ICD-10-CM | POA: Diagnosis not present

## 2016-04-16 MED ORDER — TECHNETIUM TC 99M TETROFOSMIN IV KIT
31.3000 | PACK | Freq: Once | INTRAVENOUS | Status: DC | PRN
  Filled 2016-04-16: qty 31

## 2016-04-16 MED ORDER — REGADENOSON 0.4 MG/5ML IV SOLN: 0.4000 mg | Freq: Once | INTRAVENOUS | Status: DC

## 2016-04-17 ENCOUNTER — Encounter (HOSPITAL_COMMUNITY): Payer: Self-pay

## 2016-04-17 ENCOUNTER — Ambulatory Visit (HOSPITAL_COMMUNITY)
Admission: RE | Admit: 2016-04-17 | Discharge: 2016-04-17 | Disposition: A | Discharge status: Home / Self Care | Payer: Medicare HMO | Source: Ambulatory Visit | Attending: Cardiovascular Disease | Admitting: Cardiovascular Disease

## 2016-04-17 DIAGNOSIS — M7989 Other specified soft tissue disorders: Secondary | ICD-10-CM

## 2016-04-17 LAB — MYOCARDIAL PERFUSION IMAGING
LV dias vol: 97 mL (ref 62–150)
LV sys vol: 59 mL
Peak HR: 104 {beats}/min
Rest HR: 94 {beats}/min
SDS: 1
SRS: 3
SSS: 4
TID: 1.04

## 2016-04-17 MED ORDER — TECHNETIUM TC 99M TETROFOSMIN IV KIT
28.0000 | PACK | Freq: Once | INTRAVENOUS | Status: AC | PRN
  Administered 2016-04-17: 28 via INTRAVENOUS

## 2016-04-17 NOTE — Progress Notes (Signed)
ECG is abnormal; but Images look good.  No sign of large Vessel ischemia -- no need for left heart catheterization. Normal function - visually appeared to be normal although the computer read as abnormal. -- With exertional dyspnea, I would like to confirm this with an echocardiogram if the patient is agreeable.  Leonie Man, MD

## 2016-04-20 HISTORY — PX: TRANSTHORACIC ECHOCARDIOGRAM: SHX275

## 2016-04-24 ENCOUNTER — Ambulatory Visit (HOSPITAL_COMMUNITY)
Admission: RE | Admit: 2016-04-24 | Discharge: 2016-04-24 | Disposition: A | Discharge status: Home / Self Care | Payer: Medicare HMO | Source: Ambulatory Visit | Attending: Internal Medicine | Admitting: Internal Medicine

## 2016-04-24 DIAGNOSIS — M7989 Other specified soft tissue disorders: Secondary | ICD-10-CM | POA: Insufficient documentation

## 2016-04-25 ENCOUNTER — Telehealth: Payer: Self-pay | Admitting: *Deleted

## 2016-04-25 DIAGNOSIS — R0600 Dyspnea, unspecified: Secondary | ICD-10-CM

## 2016-04-25 DIAGNOSIS — R9439 Abnormal result of other cardiovascular function study: Secondary | ICD-10-CM

## 2016-04-25 DIAGNOSIS — R0609 Other forms of dyspnea: Secondary | ICD-10-CM

## 2016-04-25 NOTE — Telephone Encounter (Signed)
-----   Message from Leonie Man, MD sent at 04/17/2016  9:04 PM EDT ----- ECG is abnormal; but Images look good.  No sign of large Vessel ischemia -- no need for left heart catheterization. Normal function - visually appeared to be normal although the computer read as abnormal. -- With exertional dyspnea, I would like to confirm this with an echocardiogram if the patient is agreeable.  Leonie Man, MD

## 2016-04-25 NOTE — Telephone Encounter (Signed)
Spoke to patient. Result given . Verbalized understanding PATIENT IN AGREEMENT TO HAVE ECHO DONE  PATIENT ALREADY HAS AN APPOINTMENT SCHEDULE 05/12/16 WITH DR [name] ECHO ORDERED.

## 2016-05-07 ENCOUNTER — Ambulatory Visit: Payer: Medicare HMO | Admitting: Cardiology

## 2016-05-13 ENCOUNTER — Ambulatory Visit (HOSPITAL_COMMUNITY)
Admission: RE | Admit: 2016-05-13 | Discharge: 2016-05-13 | Disposition: A | Discharge status: Home / Self Care | Payer: Medicare HMO | Source: Ambulatory Visit | Attending: Cardiology | Admitting: Cardiology

## 2016-05-13 DIAGNOSIS — R9439 Abnormal result of other cardiovascular function study: Secondary | ICD-10-CM | POA: Diagnosis not present

## 2016-05-13 DIAGNOSIS — R0609 Other forms of dyspnea: Secondary | ICD-10-CM

## 2016-05-13 DIAGNOSIS — R0600 Dyspnea, unspecified: Secondary | ICD-10-CM

## 2016-05-13 NOTE — Progress Notes (Signed)
*  PRELIMINARY RESULTS* Echocardiogram 2D Echocardiogram has been performed.  Leavy Cella 05/13/2016, 12:47 PM

## 2016-05-14 ENCOUNTER — Encounter: Payer: Self-pay | Admitting: Cardiology

## 2016-05-14 NOTE — Progress Notes (Signed)
PCP: Parker Cower, MD  Clinic Note: Chief Complaint  Patient presents with  . Shortness of Breath    Follow-up Myoview and echo  . Leg Swelling    Follow-up venous Doppler    HPI: [name] is a 70 y.o. male with a PMH below who presents today for 1 yr f/u of PAF --> formerly on Amiodarone that was stopped @ last visit.  He has chronic L4-L5 back pain that limits exercise.He has chronic right> left lower extremity edema Former patient of Dr. Rollene [name]. As result of financial concerns with him being retired and essentially out of his money reserves, he is hoping to avoid any excessive evaluation. Amiodarone was stopped in August 2016 due to lack of A. fib recurrence.  [name] was last seen on April 04, 2016 --> he noted concerns about right greater than left leg swelling with sores. He also noted concern for doing exercise without an evaluation of his heart. He was noticing exertional dyspnea but denied any chest tightness or pressure. To evaluate his dyspnea, I ordered a myocardial perfusion scan. We also been ordered lower extremity venous reflux and DVT Dopplers. Also ordered an echocardiogram based on conflicting data on the Myoview with reduced EF.  Recent Hospitalizations: n/a  Studies Reviewed:  Myoview 04/17/2016: LOW RISK, normal study. No evidence of ischemia or infarction. [name], the EF appeared to be greater than 60%, however computer calculation was 39%. -> Recommended echocardiogram.  Echocardiogram: 05/13/2016:  Relatively normal EF of 55-60%. Normal wall motion, suggesting no prior MI. There is grade 2 diastolic dysfunction (pseudo-normal filling pattern) along with moderately dilated left atrium.  LE Venous Duplex (for DVT & Venous Insufficiency): No evidence of DVT or large vein incompetence.    Interval History: He says he is "pretty good". He is little bit apprehensive to find out the results of the studies. He has a stable exertional  dyspnea, but he says the edema has definitely improved. No real exertional chest pain. He has been worried and not yet starting his exercise program waiting to hear the results of these studies.  Cardiovascular Review of Symptoms: positive for - dyspnea on exertion and edema negative for - chest pain, irregular heartbeat, loss of consciousness, murmur, orthopnea, palpitations, paroxysmal nocturnal dyspnea, rapid heart rate, shortness of breath or Syncope/near syncope or TIA/amaurosis fugax. No claudication.   ROS: A comprehensive was performed. Review of Systems  Constitutional: Positive for malaise/fatigue (Lack of energy mostly because of lack of exercise.). Negative for weight loss (weight gain instead of loss).  HENT: Negative for nosebleeds.   Respiratory: Positive for shortness of breath and wheezing (ooccasional). Negative for cough.   Cardiovascular: Positive for leg swelling (R>>>L - improved). Negative for chest pain.  Gastrointestinal: Negative for heartburn and melena.  Genitourinary: Negative for flank pain and hematuria.  Musculoskeletal: Positive for back pain (Very much limits his walking and activity.) and joint pain (Hips, knees and ankles). Negative for falls.  Neurological: Negative.  Negative for dizziness.  Endo/Heme/Allergies: Does not bruise/bleed easily.  Psychiatric/Behavioral: Negative for depression and memory loss. The patient is not nervous/anxious and does not have insomnia.        He seems a bit down because of his lack ability to exercise with back pain. Likely dysthymia.  All other systems reviewed and are negative.  Past Medical History:  Diagnosis Date  . Adenoma 05/2008  . Alcoholism in recovery ([place])   . BPH (benign prostatic hyperplasia)   . Chronic LBP  Hip & Back -- Sees [name] @ [place]  . Colon polyps   . Complex renal cyst 12/2010  . Controlled type 2 diabetes mellitus with neuropathy ([place]) 01/2007  . COPD (chronic  obstructive pulmonary disease) ([place])   . Diverticulitis of colon   . ED (erectile dysfunction)    s/p Penile prosthesis (09/2010)  . History of prostate cancer    [name]  . History of sick sinus syndrome    reduced BB dose for Bradycardia  . HLD (hyperlipidemia)   . Hypertension   . Impaired glucose tolerance 12/21/2013  . Nephrolithiasis   . Osteoarthritis of both hips   . Osteoarthritis of lumbosacral spine    with Disc Disease  . PAF (paroxysmal atrial fibrillation) ([facility])    No longer on Amiodarone.  Not on Anticoaguation b/c no recurrence.  . Paresthesias/numbness    Bilateral LE  . Peripheral neuropathy ([place]) 12/21/2013    Past Surgical History:  Procedure Laterality Date  . CARDIAC CATHETERIZATION  2003   Normal Coronary Arteries.  Marland Kitchen NM MYOVIEW LTD  03/2016   No Ischemia or Infarct - Visual EF ~60% (computer EF ~39%) - by Echo 55-60%  . PENILE PROSTHESIS IMPLANT  06/21/2012   Procedure: PENILE PROTHESIS INFLATABLE;  Surgeon: Parker Jabs, MD;  Location: [facility];  Service: Urology;  Laterality: N/A;  REMOVAL AND REPLACEMENT OF SOME  OF PROSTHESIS ([facility])   . PENILE PROSTHESIS PLACEMENT  09/2010  . PROSTATECTOMY    . TRANSTHORACIC ECHOCARDIOGRAM  04/2016   Relatively normal EF of 55-60%. Normal wall motion, suggesting no prior MI. There is grade 2 diastolic dysfunction (pseudo-normal filling pattern) along with moderately dilated left atrium.    Prior to Admission medications   Medication Sig Start Date End Date Taking? Authorizing Provider  amLODipine (NORVASC) 5 MG tablet TAKE ONE TABLET BY MOUTH ONCE DAILY 04/07/16  Yes Parker Man, MD  aspirin 81 MG EC tablet Take 81 mg by mouth daily.     Yes Historical Provider, MD  benazepril (LOTENSIN) 40 MG tablet TAKE ONE TABLET BY MOUTH ONCE DAILY 10/15/15  Yes Parker Man, MD  furosemide (LASIX) 40 MG tablet Take 1 tablet (40 mg total) by mouth daily. 04/04/16  Yes Parker Man, MD  ibuprofen  (ADVIL,MOTRIN) 200 MG tablet Take 200 mg by mouth as directed.     Yes Historical Provider, MD  KLOR-CON M20 20 MEQ tablet TAKE ONE TABLET BY MOUTH ONCE DAILY 03/17/16  Yes Parker Man, MD  lactase (LACTAID) 3000 UNITS tablet Take 1 tablet by mouth 3 (three) times daily as needed (BLOATING).    Yes Historical Provider, MD  metoprolol succinate (TOPROL-XL) 50 MG 24 hr tablet TAKE ONE TABLET BY MOUTH  DAILY . TAKE WITH OR IMMEDIATELY FOLLOWING A MEAL 04/07/16  Yes Parker Man, MD  omeprazole (PRILOSEC) 20 MG capsule TAKE ONE CAPSULE BY MOUTH ONCE DAILY AS NEEDED 01/21/16  Yes Biagio Borg, MD  omeprazole (PRILOSEC) 20 MG capsule Take 20 mg by mouth daily as needed (REFLUX).   Yes Historical Provider, MD  pravastatin (PRAVACHOL) 40 MG tablet TAKE ONE TABLET BY MOUTH ONCE DAILY 04/14/16  Yes Parker Man, MD  PROAIR HFA [phone] Base) MCG/ACT inhaler INHALE ONE TO TWO PUFFS BY MOUTH EVERY 6 HOURS AS NEEDED FOR WHEEZING OR SHORTNESS OF BREATH 03/17/16  Yes Biagio Borg, MD     Allergies  Allergen Reactions  . Ciprofloxacin  Other (See Comments)    All over weakness     Social History   Social History  . Marital status: Single    Spouse name: N/A  . Number of children: 3  . Years of education: N/A   Occupational History  . Retired    Social History Main Topics  . Smoking status: Former Smoker    Types: Cigarettes    Quit date: 11/04/1982  . Smokeless tobacco: Never Used  . Alcohol use Yes     Comment: no alcohol x 1 week wants to stop  . Drug use: No     Comment: use to smoke marijuana  . Sexual activity: Not Asked   Other Topics Concern  . 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   Divorced. 3 grown children. 4 GC.     Quit smoking ~20+ yrs ago.    Family History  Problem Relation Age of Onset  . Coronary artery disease Mother   . Heart attack Mother   . Coronary artery disease Father   . Prostate  cancer Father   . Diabetes Father     Wt Readings from Last 3 Encounters:  05/15/16 131 kg (288 lb 12.8 oz)  04/16/16 131.1 kg (289 lb)  04/04/16 131.1 kg (289 lb)    PHYSICAL EXAM BP (!) 150/90   Pulse 89   Ht 6\' 1"  (1.854 m)   Wt 131 kg (288 lb 12.8 oz)   SpO2 98%   BMI 38.10 kg/m  General appearance: alert, cooperative, appears stated age, no distress and Healthy-appearing. Normal mood and affect.  HEENT: [place]/AT, EOMI, MMM, anicteric sclera Neck: no adenopathy, no carotid bruit, no JVD and supple, symmetrical, trachea midline  Lungs:CTAB, normal percussion bilaterally and Nonlabored, and a movement  Heart: tachycardic with possible soft S4 normal S1 and S2. Also soft SEM at the RUSB. No other R./G. Nondisplaced PMI.  Abdomen: soft, non-tender; bowel sounds normal; no masses, no organomegaly  Extremities: extremities normal, atraumatic, no cyanosis - Notably improved edema in the right leg. Minimal edema on the left.  Pulses: 2+ and symmetric  Neurologic: Alert and oriented X 3, normal strength and tone. Normal symmetric reflexes. Normal coordination and gait is slow with kyphosis    Adult ECG Report  Not checked  Other studies Reviewed: Additional studies/ records that were reviewed today include:  Recent Labs:  No new labs available    ASSESSMENT / PLAN: Problem List Items Addressed This Visit    Swelling of lower extremity (Chronic)    Right greater than left edema. No evidence of DVT or significant large vein venous insufficiency. He probably does have microvascular venous insufficiency..  Plan: Continue current dose of Lasix. We'll also consider adding spironolactone baseline renal function. Recommend medium weight compression stockings.      Relevant Orders   Basic metabolic panel   Compression stockings   PAF (paroxysmal atrial fibrillation) - now off amiodarone (Chronic)    Still no recurrence. Continuing to be off amiodarone. At this point we did not  discuss anticoagulation unless there is a recurrence. I am increasing his beta blocker dose which will help with rate control if he were to have a recurrence. With diastolic dysfunction he would be quite symptomatic and he were to have a recurrence of A. fib.      Relevant Medications   spironolactone (ALDACTONE) 25 MG tablet   metoprolol succinate (TOPROL-XL)  100 MG 24 hr tablet   Other Relevant Orders   Basic metabolic panel   Obesity (BMI 30-39.9) (Chronic)   Relevant Orders   Compression stockings   Hypertensive heart disease with chronic diastolic congestive heart failure ([place]) - Primary (Chronic)    Not adequately controlled today..  Will increase Toprol to 100 mg daily Continue 40 mg daily Lasix, check chemistry today and if function and potassium are stable we can add spironolactone 12.5 mg daily.      Relevant Medications   spironolactone (ALDACTONE) 25 MG tablet   metoprolol succinate (TOPROL-XL) 100 MG 24 hr tablet   Other Relevant Orders   Basic metabolic panel   Exertional dyspnea (Chronic)    Overall improved. He was happy with the results of his Myoview. Happy to hear the confirmation of his EF he does have grade 2 diastolic dysfunction which could explain some of his exertional dyspnea in setting of his hypertension edema.  Plan: He should be okay going into his exercise program.  Treatment hypertensive heart disease with diastolic dysfunction.      Relevant Orders   Basic metabolic panel   Compression stockings    Other Visit Diagnoses   None.     Current medicines are reviewed at length with the patient today. (+/- concerns) n/a Patient Instructions  Increase metoprolol succinate to 100 mg one tablet daily  \ may take 2 of 50 mg metoprolol succ. Daily until bottle is empty   Please have labs drawn today- BMP  - will call when to start Spirolactone 12.5 mg ( 1/2 tablet of 25 mg tablet)  Purchase compression knee hi -- 15-20 mmHg  Your physician  recommends that you schedule a follow-up appointment in: JAN 2018 [place].  If you need a refill on your cardiac medications before your next appointment, please call your pharmacy.  CONTINUE TO WALK  Studies Ordered:   Orders Placed This Encounter  Procedures  . Compression stockings  . Basic metabolic panel     Glenetta Hew, M.D., M.S. Interventional Cardiologist   Pager # [phone]

## 2016-05-15 ENCOUNTER — Ambulatory Visit (INDEPENDENT_AMBULATORY_CARE_PROVIDER_SITE_OTHER): Payer: Medicare HMO | Admitting: Cardiology

## 2016-05-15 ENCOUNTER — Encounter: Payer: Self-pay | Admitting: Cardiology

## 2016-05-15 VITALS — BP 150/90 | HR 89 | Ht 73.0 in | Wt 288.8 lb

## 2016-05-15 DIAGNOSIS — I5032 Chronic diastolic (congestive) heart failure: Secondary | ICD-10-CM

## 2016-05-15 DIAGNOSIS — R0609 Other forms of dyspnea: Secondary | ICD-10-CM

## 2016-05-15 DIAGNOSIS — M7989 Other specified soft tissue disorders: Secondary | ICD-10-CM | POA: Diagnosis not present

## 2016-05-15 DIAGNOSIS — E669 Obesity, unspecified: Secondary | ICD-10-CM

## 2016-05-15 DIAGNOSIS — I110 Hypertensive heart disease with heart failure: Secondary | ICD-10-CM | POA: Diagnosis not present

## 2016-05-15 DIAGNOSIS — R0600 Dyspnea, unspecified: Secondary | ICD-10-CM

## 2016-05-15 DIAGNOSIS — I10 Essential (primary) hypertension: Secondary | ICD-10-CM | POA: Diagnosis not present

## 2016-05-15 DIAGNOSIS — I480 Paroxysmal atrial fibrillation: Secondary | ICD-10-CM | POA: Diagnosis not present

## 2016-05-15 LAB — BASIC METABOLIC PANEL
BUN: 15 mg/dL (ref 7–25)
CO2: 24 mmol/L (ref 20–31)
Calcium: 8.9 mg/dL (ref 8.6–10.3)
Chloride: 104 mmol/L (ref 98–110)
Creat: 1.21 mg/dL (ref 0.70–1.25)
Glucose, Bld: 108 mg/dL — ABNORMAL HIGH (ref 65–99)
Potassium: 4.3 mmol/L (ref 3.5–5.3)
Sodium: 137 mmol/L (ref 135–146)

## 2016-05-15 MED ORDER — METOPROLOL SUCCINATE ER 100 MG PO TB24: 100.0000 mg | ORAL_TABLET | Freq: Every day | ORAL | 3 refills | Status: DC

## 2016-05-15 MED ORDER — SPIRONOLACTONE 25 MG PO TABS
12.5000 mg | ORAL_TABLET | Freq: Every day | ORAL | 3 refills | Status: DC
Start: 2016-05-15 — End: 2016-09-18

## 2016-05-15 NOTE — Assessment & Plan Note (Signed)
Still no recurrence. Continuing to be off amiodarone. At this point we did not discuss anticoagulation unless there is a recurrence. I am increasing his beta blocker dose which will help with rate control if he were to have a recurrence. With diastolic dysfunction he would be quite symptomatic and he were to have a recurrence of A. fib.

## 2016-05-15 NOTE — Assessment & Plan Note (Addendum)
Not adequately controlled today..  Will increase Toprol to 100 mg daily Continue 40 mg daily Lasix, check chemistry today and if function and potassium are stable we can add spironolactone 12.5 mg daily.

## 2016-05-15 NOTE — Assessment & Plan Note (Addendum)
Overall improved. He was happy with the results of his Myoview. Happy to hear the confirmation of his EF he does have grade 2 diastolic dysfunction which could explain some of his exertional dyspnea in setting of his hypertension edema.  Plan: He should be okay going into his exercise program.  Treatment hypertensive heart disease with diastolic dysfunction.

## 2016-05-15 NOTE — Patient Instructions (Addendum)
Increase metoprolol succinate to 100 mg one tablet daily  \ may take 2 of 50 mg metoprolol succ. Daily until bottle is empty   Please have labs drawn today- BMP  - will call when to start Spirolactone 12.5 mg ( 1/2 tablet of 25 mg tablet)  Purchase compression knee hi -- 15-20 mmHg  Your physician recommends that you schedule a follow-up appointment in: JAN 2018 [place].  If you need a refill on your cardiac medications before your next appointment, please call your pharmacy.

## 2016-05-15 NOTE — Assessment & Plan Note (Signed)
Right greater than left edema. No evidence of DVT or significant large vein venous insufficiency. He probably does have microvascular venous insufficiency..  Plan: Continue current dose of Lasix. We'll also consider adding spironolactone baseline renal function. Recommend medium weight compression stockings.

## 2016-05-21 ENCOUNTER — Telehealth: Payer: Self-pay | Admitting: Cardiology

## 2016-05-21 ENCOUNTER — Telehealth: Payer: Self-pay | Admitting: *Deleted

## 2016-05-21 DIAGNOSIS — Z79899 Other long term (current) drug therapy: Secondary | ICD-10-CM

## 2016-05-21 DIAGNOSIS — I110 Hypertensive heart disease with heart failure: Secondary | ICD-10-CM

## 2016-05-21 DIAGNOSIS — M7989 Other specified soft tissue disorders: Secondary | ICD-10-CM

## 2016-05-21 DIAGNOSIS — I5032 Chronic diastolic (congestive) heart failure: Secondary | ICD-10-CM

## 2016-05-21 NOTE — Telephone Encounter (Signed)
Patient notes he's had several appts and clinic visits (for testing) done in the last couple weeks. Keeps forgetting to ask for flu shot while he's here. He's upset because he doesn't want to schedule another appointment just to have flu shot done. Wondering if he can just come in for the shot. I explained that to my understanding pt has to have appt. Not sure if he can be put on nurse calendar for this. Will inquire and review options w patient.

## 2016-05-21 NOTE — Telephone Encounter (Signed)
-----   Message from Leonie Man, MD sent at 05/21/2016  1:49 PM EDT ----- Overall, labs look pretty good and stable. Kidney function looks like he may be loaded on the dry side.  Let's recheck in 3 months and set up in one year.  Glenetta Hew, MD

## 2016-05-21 NOTE — Telephone Encounter (Signed)
Spoke to patient. Offered to set up nurse visit - flu shot informed patient - can have flu  immunization at local pharmacy or primary as well  patient states he will call back if he decide to come to office for immunzation

## 2016-05-21 NOTE — Telephone Encounter (Signed)
New Message  Pt call requesting to speak with RN about getting a flu shot. Pt states he was to get a flu shot on 10/25, but states it was a lot going on with testing that he forgot. Pt would like to know if he could come in and get the shot completed. Please call back to discuss

## 2016-05-21 NOTE — Telephone Encounter (Signed)
Spoke to patient. Result given . Verbalized understanding START SPIROLACTONE 12.5 MG DAILY PER DR [name] ORDER

## 2016-05-22 DIAGNOSIS — Z79899 Other long term (current) drug therapy: Secondary | ICD-10-CM | POA: Insufficient documentation

## 2016-05-22 NOTE — Telephone Encounter (Signed)
Future order placed for BMP . 3 MONTHS , 1 YEAR

## 2016-05-31 DIAGNOSIS — R69 Illness, unspecified: Secondary | ICD-10-CM | POA: Diagnosis not present

## 2016-07-17 ENCOUNTER — Other Ambulatory Visit: Payer: Self-pay | Admitting: *Deleted

## 2016-07-17 ENCOUNTER — Telehealth: Payer: Self-pay | Admitting: *Deleted

## 2016-07-17 DIAGNOSIS — Z79899 Other long term (current) drug therapy: Secondary | ICD-10-CM

## 2016-07-17 DIAGNOSIS — M7989 Other specified soft tissue disorders: Secondary | ICD-10-CM

## 2016-07-17 NOTE — Telephone Encounter (Signed)
Mailed letter and labslip 

## 2016-07-17 NOTE — Telephone Encounter (Signed)
-----   Message from Raiford Simmonds, RN sent at 05/22/2016 10:27 AM EDT ----- Bmp in 3 months ( jan 2018) Send in dec 2017

## 2016-07-20 ENCOUNTER — Other Ambulatory Visit: Payer: Self-pay | Admitting: Internal Medicine

## 2016-07-23 ENCOUNTER — Other Ambulatory Visit: Payer: Self-pay | Admitting: *Deleted

## 2016-07-24 ENCOUNTER — Encounter: Payer: Medicare HMO | Admitting: Internal Medicine

## 2016-07-28 ENCOUNTER — Telehealth: Payer: Self-pay | Admitting: Cardiology

## 2016-07-28 NOTE — Telephone Encounter (Signed)
New Message  Pt voiced he is wanting nurse to give him a call regarding his labs and wondering if they can be used with his upcoming appt.  Please f/u with pt

## 2016-07-28 NOTE — Telephone Encounter (Signed)
SPOKE TO PATIENT . PATIENT STATES HE HAS NOT HAD AH=[place] LABS SINCE LAST VISIT WITH DR [name].  RN INFORMED PATIENT THE LETTER HE RECEIVED WAS A REMINDER TO FOLLOW UP ON LABWORK SINCE STARTING ON NEW MEDICATION SPIROLACTONE PATIENT VERBALIZED UNDERSTANDING.

## 2016-08-12 ENCOUNTER — Ambulatory Visit: Payer: Medicare HMO | Admitting: Cardiology

## 2016-09-16 DIAGNOSIS — M7989 Other specified soft tissue disorders: Secondary | ICD-10-CM | POA: Diagnosis not present

## 2016-09-16 DIAGNOSIS — Z79899 Other long term (current) drug therapy: Secondary | ICD-10-CM | POA: Diagnosis not present

## 2016-09-17 LAB — BASIC METABOLIC PANEL
BUN: 16 mg/dL (ref 7–25)
CO2: 24 mmol/L (ref 20–31)
Calcium: 8.9 mg/dL (ref 8.6–10.3)
Chloride: 104 mmol/L (ref 98–110)
Creat: 1.17 mg/dL (ref 0.70–1.25)
Glucose, Bld: 81 mg/dL (ref 65–99)
Potassium: 4 mmol/L (ref 3.5–5.3)
Sodium: 138 mmol/L (ref 135–146)

## 2016-09-18 ENCOUNTER — Ambulatory Visit (INDEPENDENT_AMBULATORY_CARE_PROVIDER_SITE_OTHER): Payer: Medicare HMO | Admitting: Cardiology

## 2016-09-18 VITALS — BP 158/80 | HR 68 | Ht 73.0 in | Wt 276.2 lb

## 2016-09-18 DIAGNOSIS — I110 Hypertensive heart disease with heart failure: Secondary | ICD-10-CM | POA: Diagnosis not present

## 2016-09-18 DIAGNOSIS — E785 Hyperlipidemia, unspecified: Secondary | ICD-10-CM | POA: Diagnosis not present

## 2016-09-18 DIAGNOSIS — R0609 Other forms of dyspnea: Secondary | ICD-10-CM

## 2016-09-18 DIAGNOSIS — I5032 Chronic diastolic (congestive) heart failure: Secondary | ICD-10-CM | POA: Diagnosis not present

## 2016-09-18 DIAGNOSIS — R0600 Dyspnea, unspecified: Secondary | ICD-10-CM

## 2016-09-18 DIAGNOSIS — I480 Paroxysmal atrial fibrillation: Secondary | ICD-10-CM

## 2016-09-18 DIAGNOSIS — E669 Obesity, unspecified: Secondary | ICD-10-CM | POA: Diagnosis not present

## 2016-09-18 MED ORDER — POTASSIUM CHLORIDE CRYS ER 20 MEQ PO TBCR: 20.0000 meq | EXTENDED_RELEASE_TABLET | ORAL | 3 refills | Status: DC

## 2016-09-18 MED ORDER — AMLODIPINE BESYLATE 10 MG PO TABS: 10.0000 mg | ORAL_TABLET | Freq: Every day | ORAL | 3 refills | Status: DC

## 2016-09-18 MED ORDER — SPIRONOLACTONE 25 MG PO TABS: 25.0000 mg | ORAL_TABLET | Freq: Every day | ORAL | 3 refills | Status: DC

## 2016-09-18 NOTE — Patient Instructions (Addendum)
MEDICATION  CHANGES  DECREASE  POTASSIUM 20 MEQ ( K-DUR,KLOR CON)TO EVERY OTHER DAY  INCREASE SPIROLACTONE INCREASE 25 MG ( WHOLE TABLET ) ONE DAILY  INCREASE AMLODIPINE 10 MG (ONE TABLET DAILY) MAY DOUBLE 5 5MG  UNTIL BOTTLE IS EMPTY   OKAY TO EXERCISE  Your physician wants you to follow-up in [name].You will receive a reminder letter in the mail two months in advance. If you don't receive a letter, please call our office to schedule the follow-up appointment.   If you need a refill on your cardiac medications before your next appointment, please call your pharmacy.

## 2016-09-18 NOTE — Progress Notes (Signed)
PCP: Cathlean Cower, MD  Clinic Note: Chief Complaint  Patient presents with  . Follow-up    pt denied chest pain and SOB  . Hypertension  . Atrial Fibrillation    HPI: [name] is a 71 y.o. male with a PMH below who presents today for 1 yr f/u of PAF --> formerly on Amiodarone that was stopped @ last visit.  He has chronic L4-L5 back pain that limits exercise.He has chronic right> left lower extremity edema Former patient of Dr. Rollene Fare & [facility]. As result of financial concerns with him being retired and essentially out of his money reserves, he is hoping to avoid any excessive evaluation. Amiodarone was stopped in August 2016 due to lack of A. fib recurrence.  Vineet Kuhrt was last seen on May 15, 2016 --> This was a follow up evaluation after having a Myoview and echocardiogram performed. He had noted concerns about right greater than left leg swelling with sores. He also noted concern for doing exercise without an evaluation of his heart because of exertional dyspnea but denied any chest tightness or pressure. To evaluate his dyspnea, I ordered a myocardial perfusion scan. We also been ordered lower extremity venous reflux and DVT Dopplers. An echocardiogram was ordered based on conflicting data on the Myoview with reduced EF.  Recent Hospitalizations: n/a  Studies Reviewed:  Myoview 04/17/2016: LOW RISK, normal study. No evidence of ischemia or infarction. Almyra Free, the EF appeared to be greater than 60%, however computer calculation was 39%. -> Recommended echocardiogram.  Echocardiogram: 05/13/2016:  Relatively normal EF of 55-60%. Normal wall motion, suggesting no prior MI. There is grade 2 diastolic dysfunction (pseudo-normal filling pattern) along with moderately dilated left atrium.  LE Venous Duplex (for DVT & Venous Insufficiency): No evidence of DVT or large vein incompetence.    Interval History: He says he is "pretty good". He  is now happier about the results of the studies. He still little bit concerned about starting access program and was hoping that he get involved in some type of rehabilitation program. He is not noted any recurrence of any rapid irregular heartbeats or palpitations. Nothing to suggest recurrence of A. fib. Discussions he is no longer on anticoagulation or on amiodarone for atrial fibrillation. He is taking aspirin and high-dose Toprol for rate control. He has not really noticed any PND or orthopnea but does have lower extremity edema on the right greater than left. This is been pretty well-controlled with his current dose of furosemide. He has a stable exertional dyspnea, but no real exertional chest pain.  Cardiovascular Review of Symptoms: positive for - dyspnea on exertion and edema negative for - chest pain, irregular heartbeat, loss of consciousness, murmur, orthopnea, palpitations, paroxysmal nocturnal dyspnea, rapid heart rate, shortness of breath or Syncope/near syncope or TIA/amaurosis fugax. No claudication.   ROS: A comprehensive was performed. Review of Systems  Constitutional: Positive for malaise/fatigue (Lack of energy mostly because of lack of exercise.). Negative for weight loss (weight gain instead of loss).  HENT: Negative for nosebleeds.   Respiratory: Positive for shortness of breath and wheezing (Occasional). Negative for cough.   Cardiovascular: Positive for leg swelling (R>>>L - improved). Negative for chest pain.  Gastrointestinal: Negative for heartburn and melena.  Genitourinary: Negative for flank pain and hematuria.  Musculoskeletal: Positive for back pain (Very much limits his walking and activity.), joint pain (Hips, knees and ankles) and neck pain. Negative for falls.  Skin: Negative.   Neurological: Negative.  Negative for dizziness.  Endo/Heme/Allergies: Does not bruise/bleed easily.  Psychiatric/Behavioral: Negative for depression and memory loss. The patient  is not nervous/anxious and does not have insomnia.        He seems a bit down because of his lack ability to exercise with back pain. Likely dysthymia.  All other systems reviewed and are negative.  Past Medical History:  Diagnosis Date  . Adenoma 05/2008  . Alcoholism in recovery ([place])   . BPH (benign prostatic hyperplasia)   . Chronic LBP    Hip & Back -- Sees Dr. Maia Petties @ [facility]  . Colon polyps   . Complex renal cyst 12/2010  . Controlled type 2 diabetes mellitus with neuropathy ([place]) 01/2007  . COPD (chronic obstructive pulmonary disease) ([place])   . Diverticulitis of colon   . ED (erectile dysfunction)    s/p Penile prosthesis (09/2010)  . History of prostate cancer    Dr. Karsten Ro  . History of sick sinus syndrome    reduced BB dose for Bradycardia  . HLD (hyperlipidemia)   . Hypertension   . Impaired glucose tolerance 12/21/2013  . Nephrolithiasis   . Osteoarthritis of both hips   . Osteoarthritis of lumbosacral spine    with Disc Disease  . PAF (paroxysmal atrial fibrillation) ([facility])    No longer on Amiodarone.  Not on Anticoaguation b/c no recurrence.  . Paresthesias/numbness    Bilateral LE  . Peripheral neuropathy ([place]) 12/21/2013    Past Surgical History:  Procedure Laterality Date  . CARDIAC CATHETERIZATION  2003   Normal Coronary Arteries.  Marland Kitchen NM MYOVIEW LTD  03/2016   No Ischemia or Infarct - Visual EF ~60% (computer EF ~39%) - by Echo 55-60%  . PENILE PROSTHESIS IMPLANT  06/21/2012   Procedure: PENILE PROTHESIS INFLATABLE;  Surgeon: Claybon Jabs, MD;  Location: [facility];  Service: Urology;  Laterality: N/A;  REMOVAL AND REPLACEMENT OF SOME  OF PROSTHESIS ([facility])   . PENILE PROSTHESIS PLACEMENT  09/2010  . PROSTATECTOMY    . TRANSTHORACIC ECHOCARDIOGRAM  04/2016   Relatively normal EF of 55-60%. Normal wall motion, suggesting no prior MI. There is grade 2 diastolic dysfunction (pseudo-normal filling pattern) along with moderately  dilated left atrium.    Current Meds  Medication Sig  . aspirin 81 MG EC tablet Take 81 mg by mouth daily.    . benazepril (LOTENSIN) 40 MG tablet TAKE ONE TABLET BY MOUTH ONCE DAILY  . furosemide (LASIX) 40 MG tablet Take 1 tablet (40 mg total) by mouth daily.  Marland Kitchen ibuprofen (ADVIL,MOTRIN) 200 MG tablet Take 200 mg by mouth as directed.    . lactase (LACTAID) 3000 UNITS tablet Take 1 tablet by mouth 3 (three) times daily as needed (BLOATING).   Marland Kitchen metoprolol succinate (TOPROL-XL) 100 MG 24 hr tablet Take 1 tablet (100 mg total) by mouth daily. Take with or immediately following a meal.  . potassium chloride SA (KLOR-CON M20) 20 MEQ tablet Take 1 tablet (20 mEq total) by mouth every other day.  . pravastatin (PRAVACHOL) 40 MG tablet TAKE ONE TABLET BY MOUTH ONCE DAILY  . PROAIR HFA 108 (90 Base) MCG/ACT inhaler INHALE ONE TO TWO PUFFS BY MOUTH EVERY 6 HOURS AS NEEDED FOR WHEEZING OR SHORTNESS OF BREATH  . spironolactone (ALDACTONE) 25 MG tablet Take 1 tablet (25 mg total) by mouth daily.  . [DISCONTINUED] amLODipine (NORVASC) 5 MG tablet TAKE ONE TABLET BY MOUTH ONCE DAILY  . [DISCONTINUED] KLOR-CON M20  20 MEQ tablet TAKE ONE TABLET BY MOUTH ONCE DAILY  . [DISCONTINUED] spironolactone (ALDACTONE) 25 MG tablet Take 0.5 tablets (12.5 mg total) by mouth daily.     Allergies  Allergen Reactions  . Ciprofloxacin Other (See Comments)    All over weakness     Social History   Social History  . Marital status: Single    Spouse name: N/A  . Number of children: 3  . Years of education: N/A   Occupational History  . Retired    Social History Main Topics  . Smoking status: Former Smoker    Types: Cigarettes    Quit date: 11/04/1982  . Smokeless tobacco: Never Used  . Alcohol use Yes     Comment: no alcohol x 1 week wants to stop  . Drug use: No     Comment: use to smoke marijuana  . Sexual activity: Not Asked   Other Topics Concern  . 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   Divorced. 3 grown children. 4 GC.     Quit smoking ~20+ yrs ago.    Family History  Problem Relation Age of Onset  . Coronary artery disease Mother   . Heart attack Mother   . Coronary artery disease Father   . Prostate cancer Father   . Diabetes Father     Wt Readings from Last 3 Encounters:  09/18/16 125.3 kg (276 lb 3.2 oz)  05/15/16 131 kg (288 lb 12.8 oz)  04/16/16 131.1 kg (289 lb)    PHYSICAL EXAM BP (!) 158/80   Pulse 68   Ht 6\' 1"  (1.854 m)   Wt 125.3 kg (276 lb 3.2 oz)   BMI 36.44 kg/m  General appearance: alert, cooperative, appears stated age, no distress and Healthy-appearing. Normal mood and affect.  HEENT: [place]/AT, EOMI, MMM, anicteric sclera Neck: no adenopathy, no carotid bruit, no JVD and supple, symmetrical, trachea midline  Lungs:CTAB, normal percussion bilaterally and Nonlabored, and a movement  Heart: tachycardic with possible soft S4 normal S1 and S2. Also soft SEM at the RUSB. No other R./G. Nondisplaced PMI.  Abdomen: soft, non-tender; bowel sounds normal; no masses, no organomegaly  Extremities: extremities normal, atraumatic, no cyanosis - Notably improved edema in the right leg. Minimal edema on the left.  Pulses: 2+ and symmetric; he has some thoracic and cervical kyphosis Neurologic: Alert and oriented X 3, normal strength and tone. Normal symmetric reflexes. Normal coordination and gait is slow with kyphosis    Adult ECG Report  Not checked  Other studies Reviewed: Additional studies/ records that were reviewed today include:  Recent Labs:  Lipids followed by PCP Lab Results  Component Value Date   CREATININE 1.17 09/16/2016   BUN 16 09/16/2016   NA 138 09/16/2016   K 4.0 09/16/2016   CL 104 09/16/2016   CO2 24 09/16/2016   Lab Results  Component Value Date   CHOL 132 07/05/2014   HDL 35.90 (L) 07/05/2014   LDLCALC 87 07/05/2014   TRIG 45.0 07/05/2014    CHOLHDL 4 07/05/2014    ASSESSMENT / PLAN: Problem List Items Addressed This Visit    Exertional dyspnea (Chronic)    Relatively normal Myoview. Echo did show grade 2 diastolic dysfunction therefore he probably has microvascular ischemia with diastolic dysfunction mediated dyspnea with exertion. Plan would be to increase his hypertensive regimen and maintained stable diuretic dose.  Hyperlipidemia with target LDL less than 100 (Chronic)    On pravastatin. Labs monitored by PCP.      Relevant Medications   spironolactone (ALDACTONE) 25 MG tablet   amLODipine (NORVASC) 10 MG tablet   Hypertensive heart disease with chronic diastolic congestive heart failure (HCC) - Primary (Chronic)    Blood pressure still remains elevated today. Plan will be to increase spironolactone to 25 mg daily which will allow [place] to reduce his potassium to every other day. I will then also increase amlodipine to 10 mg daily. -Continue current dose of ?with instructions to take when necessary dosing for increased weight gain and edema.       Relevant Medications   spironolactone (ALDACTONE) 25 MG tablet   amLODipine (NORVASC) 10 MG tablet   Obesity (BMI 30-39.9) (Chronic)    Really limited by back pain as far as exercise, but he is also concerned about being monitored from a cardiac standpoint before does exercise. I reassured him that the positive results from his stress test echocardiogram should make him feel quite comfortable about doing the Silver sneakers exercise program without aggressive monitoring.  Talk about dietary discretion and talked about exercise including water aerobics.      PAF (paroxysmal atrial fibrillation) - now off amiodarone (Chronic)    As far as I can tell, he still not had any recurrent symptoms of A. fib. We stopped amiodarone over a year ago based on his request. He is on high-dose metoprolol for rate control. He was symptomatic when in A. fib likely related to diastolic  dysfunction mediated heart failure symptoms. Therefore I'm relatively certain that he is not had any recurrent symptoms. He the past has been reluctant to discuss anticoagulation. At this point he would prefer to stay on aspirin.  This patients CHA2DS2-VASc Score and unadjusted Ischemic Stroke Rate (% per year) is equal to 2.2 % stroke rate/year from a score of 2  Above score calculated as 1 point each if present [CHF, HTN, DM, Vascular=MI/PAD/Aortic Plaque, Age if 75-74, or Male]; 2 points each if present [Age > 75, or Stroke/TIA/TE]        Relevant Medications   spironolactone (ALDACTONE) 25 MG tablet   amLODipine (NORVASC) 10 MG tablet      Current medicines are reviewed at length with the patient today. (+/- concerns) n/a Patient Instructions  MEDICATION  CHANGES  DECREASE  POTASSIUM 20 MEQ ( K-DUR,KLOR CON)TO EVERY OTHER DAY  INCREASE SPIROLACTONE INCREASE 25 MG ( WHOLE TABLET ) ONE DAILY  INCREASE AMLODIPINE 10 MG (ONE TABLET DAILY) MAY DOUBLE 5 5MG  UNTIL BOTTLE IS EMPTY   OKAY TO EXERCISE  Your physician wants you to follow-up in [place] [name].You will receive a reminder letter in the mail two months in advance. If you don't receive a letter, please call our office to schedule the follow-up appointment.   If you need a refill on your cardiac medications before your next appointment, please call your pharmacy.    CONTINUE TO WALK / EXERCISE  Studies Ordered:   No orders of the defined types were placed in this encounter.    Glenetta Hew, M.D., M.S. Interventional Cardiologist   Pager # [phone]

## 2016-09-20 ENCOUNTER — Encounter: Payer: Self-pay | Admitting: Cardiology

## 2016-09-20 NOTE — Assessment & Plan Note (Signed)
Relatively normal Myoview. Echo did show grade 2 diastolic dysfunction therefore he probably has microvascular ischemia with diastolic dysfunction mediated dyspnea with exertion. Plan would be to increase his hypertensive regimen and maintained stable diuretic dose.

## 2016-09-20 NOTE — Assessment & Plan Note (Signed)
On pravastatin. Labs monitored by PCP.

## 2016-09-20 NOTE — Assessment & Plan Note (Addendum)
Blood pressure still remains elevated today. Plan will be to increase spironolactone to 25 mg daily which will allow [place] to reduce his potassium to every other day. I will then also increase amlodipine to 10 mg daily. -Continue current dose of ?with instructions to take when necessary dosing for increased weight gain and edema.

## 2016-09-20 NOTE — Assessment & Plan Note (Signed)
Really limited by back pain as far as exercise, but he is also concerned about being monitored from a cardiac standpoint before does exercise. I reassured him that the positive results from his stress test echocardiogram should make him feel quite comfortable about doing the Silver sneakers exercise program without aggressive monitoring.  Talk about dietary discretion and talked about exercise including water aerobics.

## 2016-09-20 NOTE — Assessment & Plan Note (Signed)
As far as I can tell, he still not had any recurrent symptoms of A. fib. We stopped amiodarone over a year ago based on his request. He is on high-dose metoprolol for rate control. He was symptomatic when in A. fib likely related to diastolic dysfunction mediated heart failure symptoms. Therefore I'm relatively certain that he is not had any recurrent symptoms. He the past has been reluctant to discuss anticoagulation. At this point he would prefer to stay on aspirin.  This patients CHA2DS2-VASc Score and unadjusted Ischemic Stroke Rate (% per year) is equal to 2.2 % stroke rate/year from a score of 2  Above score calculated as 1 point each if present [CHF, HTN, DM, Vascular=MI/PAD/Aortic Plaque, Age if 65-74, or Male]; 2 points each if present [Age > 75, or Stroke/TIA/TE]

## 2016-09-24 ENCOUNTER — Telehealth: Payer: Self-pay | Admitting: *Deleted

## 2016-09-24 NOTE — Telephone Encounter (Signed)
-----   Message from Leonie Man, MD sent at 09/23/2016 10:27 PM EST ----- Lab results - renal function is stable. Potassium level is OK.  Glucose looks better. Continue with the changes noted during last visit.  Glenetta Hew, MD

## 2016-09-24 NOTE — Telephone Encounter (Signed)
Left detail message of results on secure voicemail. Any question may call back

## 2016-10-06 ENCOUNTER — Telehealth: Payer: Self-pay | Admitting: Cardiology

## 2016-10-06 NOTE — Telephone Encounter (Signed)
New message     Pt calling regarding a call he received last week about changing his prescription of  spironolactone (ALDACTONE) 25 MG tablet Take 1 tablet (25 mg total) by mouth daily.   The prescription needs resent to [place] at [facility] they are only giving him 45 tablets instead of 90 (they said they need new prescription stating 90 tablets ( 1 tablet daily)

## 2016-10-06 NOTE — Telephone Encounter (Signed)
F/U call: Patient is calling for [facility] in regards to medication refill., thanks.

## 2016-10-06 NOTE — Telephone Encounter (Signed)
Pt states that he received all hix rx's that he needed but nothing else is needed

## 2016-10-06 NOTE — Telephone Encounter (Signed)
Per pharmacy pt did not request spironlactone for refill she will fill it now and it will be ready with aclodipine with quantity #90  Pt notified

## 2016-11-07 ENCOUNTER — Telehealth: Payer: Self-pay | Admitting: Cardiology

## 2016-11-07 NOTE — Telephone Encounter (Signed)
Left message for pt to call.

## 2016-11-07 NOTE — Telephone Encounter (Signed)
Spoke to Dr. Maxie Barb sign Monday.

## 2016-11-07 NOTE — Telephone Encounter (Signed)
Spoke with pt, he was a former patient of dr Rollene Fare and it is time to renew. He is asking if dr Ellyn Hack will be willing to sign the handicap plaque for him. Will forward to dr harding for his approval. The patient has the form at home and can bring it by the office if dr harding is willing to sign.

## 2016-11-07 NOTE — Telephone Encounter (Signed)
Pt has a question about paperwork and needs a call back about his disability sticker please.

## 2016-11-07 NOTE — Telephone Encounter (Signed)
Spoke with pt, he will come by the office Monday with the form.

## 2016-11-16 ENCOUNTER — Other Ambulatory Visit: Payer: Self-pay | Admitting: Cardiology

## 2016-11-17 NOTE — Telephone Encounter (Signed)
REFILL 

## 2016-12-30 ENCOUNTER — Telehealth: Payer: Self-pay | Admitting: Cardiology

## 2016-12-30 NOTE — Telephone Encounter (Signed)
New message    Pt is calling to find out if letter he mailed for his replacement handicap placard has been received.

## 2016-12-30 NOTE — Telephone Encounter (Signed)
Message routed to [place], [place] for follow up on handicap placard There are EPIC notes from April 2018 stating patient was to drop off paperwork

## 2017-01-02 NOTE — Telephone Encounter (Signed)
F/U Call:  Patient is calling for update on Dr. Allison Quarry signature on patient handicap sticker. Please call patient, he states that it is very important.

## 2017-01-02 NOTE — Telephone Encounter (Signed)
Spoke to patient  Patient states he mailed placard form last week . Will contact patient by the middle of next week if form has not been received. Patient verbalized understanding

## 2017-01-07 ENCOUNTER — Telehealth: Payer: Self-pay | Admitting: Cardiology

## 2017-01-07 NOTE — Telephone Encounter (Signed)
New message    Pt is calling asking for a call back about his handicap sticker.

## 2017-01-07 NOTE — Telephone Encounter (Signed)
Returned call, no answer when dialed/call air dropped.  I asked [name] if she's gotten placard form in mail, she notes she has not - she's aware of patient request & advises that he can bring the form in to office for signature.

## 2017-01-07 NOTE — Telephone Encounter (Signed)
Pt voiced he's mailed the form. He does not want to come in, cannot drive. Asked [place] to keep a look out for it.   Voiced that if nothing received he'll follow up beginning of next week. He expressed thanks for call back.

## 2017-01-08 NOTE — Telephone Encounter (Signed)
PATIENT AWARE OFFICE  RECEIVED HANDICAP FORM - WILL HAVE IT SIGNED AND MAILED BACK TO PATIENT.  PATIENT VERBALIZED UNDERSTANDING.

## 2017-01-09 NOTE — Telephone Encounter (Signed)
Patient aware  Handicapped placard form signed and placed in mail

## 2017-01-28 ENCOUNTER — Telehealth: Payer: Self-pay

## 2017-01-28 NOTE — Telephone Encounter (Signed)
Patient is on the list for [employer] 2018 and may be a good candidate for an AWV. Please let me know if/when appt is scheduled.   

## 2017-01-28 NOTE — Telephone Encounter (Signed)
Pt declined AWV at this time.  °

## 2017-03-04 ENCOUNTER — Encounter: Payer: Self-pay | Admitting: Podiatry

## 2017-03-04 ENCOUNTER — Ambulatory Visit (INDEPENDENT_AMBULATORY_CARE_PROVIDER_SITE_OTHER): Payer: Medicare HMO | Admitting: Podiatry

## 2017-03-04 VITALS — BP 145/76 | HR 68

## 2017-03-04 DIAGNOSIS — M79676 Pain in unspecified toe(s): Secondary | ICD-10-CM | POA: Diagnosis not present

## 2017-03-04 DIAGNOSIS — B351 Tinea unguium: Secondary | ICD-10-CM | POA: Diagnosis not present

## 2017-03-04 DIAGNOSIS — G629 Polyneuropathy, unspecified: Secondary | ICD-10-CM

## 2017-03-04 NOTE — Progress Notes (Signed)
   Subjective:    Patient ID: [name], male    DOB: 22-Jan-1946, 71 y.o.   MRN: [id number]  HPI this patient presents the office with chief complaint of long thick painful.  Patient states it has been a long-time since his nails have been done and he is scared to do them himself  . He has pain and discomfort walking and wearing his shoes.  He says the third and fourth toenails on the right foot are turning under his toes.  He has a history of peripheral neuropathy.  He presents the office today for preventative foot care services.    Review of Systems  Respiratory: Positive for shortness of breath.   Cardiovascular: Positive for leg swelling.  Gastrointestinal: Negative for rectal pain.  Endocrine:       Increased urination  Genitourinary: Positive for frequency.  Musculoskeletal: Positive for back pain and gait problem.       Joint pain  Neurological: Positive for tremors and numbness.       Objective:   Physical Exam GENERAL APPEARANCE: Alert, conversant. Appropriately groomed. No acute distress.  VASCULAR: Pedal pulses are  palpable at  DP bilateral. PT are absent  B/L secondary to swelling.  Capillary refill time is immediate to all digits,  Normal temperature gradient.   NEUROLOGIC: sensation is diminished/absent  to 5.07 monofilament at 5/5 sites bilateral.  Light touch is diminished  bilateral, Muscle strength normal.  MUSCULOSKELETAL: acceptable muscle strength, tone and stability bilateral.  Intrinsic muscluature intact bilateral.  Rectus appearance of foot and digits noted bilateral.  NAILS  thick disfigured discolored nails with subungual debris noted bilateral.  No evidence of bacterial infection or drainage.  Multiple rams horn toenails are noted right foot. DERMATOLOGIC: skin color, texture, and turgor are within normal limits.  No preulcerative lesions or ulcers  are seen, no interdigital maceration noted.  No open lesions present.  Digital nails are asymptomatic.  No drainage noted.         Assessment & Plan:  Onychomycosis  B/l  Peripheral neuropathy Initial exam  .  Debridement of his long thick painful mycotic nails.  Patient was instructed to watch his feet due to his neuropathy.  RTC 3 months   Gardiner Barefoot DPM

## 2017-03-08 ENCOUNTER — Other Ambulatory Visit: Payer: Self-pay | Admitting: Internal Medicine

## 2017-03-11 ENCOUNTER — Telehealth: Payer: Self-pay | Admitting: Cardiology

## 2017-03-11 NOTE — Telephone Encounter (Signed)
S/w pt informed to get OTC he will try and call back if anything else is needed.

## 2017-03-11 NOTE — Telephone Encounter (Signed)
New message      Pt is calling to get the size of compression stocking/socks Dr Ellyn Hack recommends.  He did not write it down and now he cannot remember what he said.

## 2017-03-24 ENCOUNTER — Ambulatory Visit (INDEPENDENT_AMBULATORY_CARE_PROVIDER_SITE_OTHER): Payer: Medicare HMO | Admitting: Cardiology

## 2017-03-24 VITALS — BP 130/68 | HR 54 | Ht 73.0 in | Wt 270.6 lb

## 2017-03-24 DIAGNOSIS — M7989 Other specified soft tissue disorders: Secondary | ICD-10-CM

## 2017-03-24 DIAGNOSIS — I110 Hypertensive heart disease with heart failure: Secondary | ICD-10-CM | POA: Diagnosis not present

## 2017-03-24 DIAGNOSIS — I480 Paroxysmal atrial fibrillation: Secondary | ICD-10-CM

## 2017-03-24 DIAGNOSIS — I5032 Chronic diastolic (congestive) heart failure: Secondary | ICD-10-CM

## 2017-03-24 MED ORDER — RIVAROXABAN 20 MG PO TABS: 20.0000 mg | ORAL_TABLET | Freq: Every day | ORAL | 6 refills | Status: DC

## 2017-03-24 NOTE — Progress Notes (Signed)
PCP: Biagio Borg, MD  Clinic Note: Chief Complaint  Patient presents with  . Follow-up    pt denied chest pain, pt c/o SOB  . Atrial Fibrillation    As accommodation of diabetes heart disease/heart failure  . Edema    Right leg    HPI: [name] is a 71 y.o. male with a PMH below who presents today for 6 month f/u for PAF & edema.. . Former patient of Dr. Rollene Fare & [facility]!!!.   Aly Subia was last seen on September 18 2016  Recent Hospitalizations: none  Studies Personally Reviewed - (if available, images/films reviewed: From Epic Chart or Care Everywhere)  None  Interval History: [name] presents here today for follow-up still noting exertional dyspnea. It seems to be relatively stable for him and denies any acute changes in his current condition. Biggest concern he has just had a fall already tripped and landed on his face. He denied any significant dizziness or wooziness, lightheadedness. No loss of consciousness. No concussion. He also notes that his right leg is very swollen compared to the left. He denies any sensation of irregular rapid heartbeat/palpitations. He does have a little bit of orthopnea, but no real PND. He denies any chest tightness pressure with rest or exertion. No resting dyspnea, just simply with exertion.  No palpitations, lightheadedness, dizziness, weakness or syncope/near syncope. No TIA/amaurosis fugax symptoms. No melena, hematochezia, hematuria, or epstaxis.  He just knows that he is losing a bit from his avulsions on his forehead, nose and forearm No claudication.  ROS: A comprehensive was performed. Review of Systems  Constitutional: Positive for malaise/fatigue. Negative for weight loss.  HENT: Positive for hearing loss. Negative for congestion and nosebleeds.   Respiratory: Positive for shortness of breath and wheezing.   Gastrointestinal: Positive for constipation. Negative for blood in stool,  diarrhea and melena.  Genitourinary: Positive for frequency. Negative for hematuria (Not at present).  Musculoskeletal: Positive for back pain, falls (Banged his forehead) and joint pain.  Skin: Negative.   Endo/Heme/Allergies: Bruises/bleeds easily.  Psychiatric/Behavioral: Positive for depression (Perhaps dysthymia). Negative for memory loss. The patient does not have insomnia.   All other systems reviewed and are negative.   I have reviewed and (if needed) personally updated the patient's problem list, medications, allergies, past medical and surgical history, social and family history.   Past Medical History:  Diagnosis Date  . Adenoma 05/2008  . Alcoholism in recovery ([place])   . BPH (benign prostatic hyperplasia)   . Chronic LBP    Hip & Back -- Sees Dr. Maia Petties @ [place]  . Colon polyps   . Complex renal cyst 12/2010  . Controlled type 2 diabetes mellitus with neuropathy ([place]) 01/2007  . COPD (chronic obstructive pulmonary disease) ([place])   . Diverticulitis of colon   . ED (erectile dysfunction)    s/p Penile prosthesis (09/2010)  . History of prostate cancer    Dr. Karsten Ro  . History of sick sinus syndrome    reduced BB dose for Bradycardia  . HLD (hyperlipidemia)   . Hypertension   . Impaired glucose tolerance 12/21/2013  . Nephrolithiasis   . Osteoarthritis of both hips   . Osteoarthritis of lumbosacral spine    with Disc Disease  . PAF (paroxysmal atrial fibrillation) ([facility])    No longer on Amiodarone.  Not on Anticoaguation b/c no recurrence.  . Paresthesias/numbness    Bilateral LE  . Peripheral neuropathy 12/21/2013  Past Surgical History:  Procedure Laterality Date  . CARDIAC CATHETERIZATION  2003   Normal Coronary Arteries.  Marland Kitchen NM MYOVIEW LTD  03/2016   No Ischemia or Infarct - Visual EF ~60% (computer EF ~39%) - by Echo 55-60%  . PENILE PROSTHESIS IMPLANT  06/21/2012   Procedure: PENILE PROTHESIS INFLATABLE;  Surgeon: Claybon Jabs, MD;   Location: [facility];  Service: Urology;  Laterality: N/A;  REMOVAL AND REPLACEMENT OF SOME  OF PROSTHESIS (AMS)   . PENILE PROSTHESIS PLACEMENT  09/2010  . PROSTATECTOMY    . TRANSTHORACIC ECHOCARDIOGRAM  04/2016   Relatively normal EF of 55-60%. Normal wall motion, suggesting no prior MI. There is grade 2 diastolic dysfunction (pseudo-normal filling pattern) along with moderately dilated left atrium.  LE Venous Duplex (for DVT & Venous Insufficiency): No evidence of DVT or large vein incompetence.   Current Meds  Medication Sig  . amLODipine (NORVASC) 10 MG tablet   . aspirin 81 MG EC tablet Take 81 mg by mouth daily.    . benazepril (LOTENSIN) 40 MG tablet TAKE ONE TABLET BY MOUTH ONCE DAILY  . furosemide (LASIX) 40 MG tablet Take 1 tablet (40 mg total) by mouth daily.  Marland Kitchen ibuprofen (ADVIL,MOTRIN) 200 MG tablet Take 200 mg by mouth as directed.    . metoprolol succinate (TOPROL-XL) 100 MG 24 hr tablet Take 100 mg by mouth daily.   Marland Kitchen omeprazole (PRILOSEC) 20 MG capsule TAKE ONE CAPSULE BY MOUTH ONCE DAILY AS NEEDED  . potassium chloride SA (KLOR-CON M20) 20 MEQ tablet Take 1 tablet (20 mEq total) by mouth every other day.  . pravastatin (PRAVACHOL) 40 MG tablet TAKE ONE TABLET BY MOUTH ONCE DAILY  . spironolactone (ALDACTONE) 25 MG tablet Take 1 tablet (25 mg total) by mouth daily.    Allergies  Allergen Reactions  . Ciprofloxacin Other (See Comments)    All over weakness    Social History   Social History  . Marital status: Single    Spouse name: N/A  . Number of children: 3  . Years of education: N/A   Occupational History  . Retired    Social History Main Topics  . Smoking status: Former Smoker    Types: Cigarettes    Quit date: 11/04/1982  . Smokeless tobacco: Never Used  . Alcohol use Yes     Comment: no alcohol x 1 week wants to stop  . Drug use: No     Comment: use to smoke marijuana  . Sexual activity: Not Asked   Other Topics Concern  . 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   Divorced. 3 grown children. 4 GC.     Quit smoking ~20+ yrs ago.     family history includes Coronary artery disease in his father and mother; Diabetes in his father; Heart attack in his mother; Prostate cancer in his father.  Wt Readings from Last 3 Encounters:  03/24/17 270 lb 9.6 oz (122.7 kg)  09/18/16 276 lb 3.2 oz (125.3 kg)  05/15/16 288 lb 12.8 oz (131 kg)  - not eating as much  PHYSICAL EXAM BP 130/68   Pulse (!) 54   Ht 6\' 1"  (1.854 m)   Wt 270 lb 9.6 oz (122.7 kg)   BMI 35.70 kg/m  Physical Exam  Constitutional: He is oriented to person, place, and time. He appears well-developed and well-nourished. No distress.  Moderately obese. Well groomed  HENT:  Head: Normocephalic and atraumatic.  Eyes: EOM are normal. No scleral icterus.  Neck: Normal range of motion. Neck supple. No hepatojugular reflux and no JVD (Trivial) present. Carotid bruit is not present.  Cardiovascular: S1 normal, S2 normal and intact distal pulses.  An irregularly irregular rhythm present. Bradycardia present.  PMI is not displaced.  Exam reveals distant heart sounds.   Murmur heard.  Medium-pitched harsh early systolic murmur is present with a grade of 1/6  at the upper right sternal border Pulmonary/Chest: Effort normal. No respiratory distress. He has no wheezes. He has no rales.  Mildly diminished breath sounds, likely due to poor effort  Musculoskeletal: He exhibits edema (2-3 + right leg, minimal on left; mild venous stasis changes).  Slow, wide-based gait. Somewhat shuffling with significant kyphoscoliosis  Neurological: He is alert and oriented to person, place, and time.  Skin: Skin is warm and dry. No erythema.  Psychiatric: He has a normal mood and affect. His behavior is normal. Judgment and thought content normal.  Nursing note and vitals reviewed.    Adult ECG Report   Rate: 54 ;  Rhythm: atrial fibrillation and (Coarse atrial fibrillation as opposed to be atrial flutter by computer read), borderline left axis deviation (intermediate) nonspecific ST and T-wave changes.;   Narrative Interpretation: Current EKG clearly indicates either coarse A. fib versus atrial flutter.   Other studies Reviewed: Additional studies/ records that were reviewed today include:  Recent Labs:    Lab Results  Component Value Date   CREATININE 1.17 09/16/2016   BUN 16 09/16/2016   NA 138 09/16/2016   K 4.0 09/16/2016   CL 104 09/16/2016   CO2 24 09/16/2016    ASSESSMENT / PLAN: Problem List Items Addressed This Visit    Hypertensive heart disease with chronic diastolic congestive heart failure ([place]) - Primary (Chronic)    Well-controlled current pressures on benazepril and metoprolol succinate as Spironolactone and amlodipine. He is on a standing dose of Lasix, I think he could probably benefit from taking a little bit more for his leg edema. Also recommended foot elevation and support stockings.      Relevant Medications   rivaroxaban (XARELTO) 20 MG TABS tablet   Other Relevant Orders   EKG 12-Lead (Completed)   PAF (paroxysmal atrial fibrillation) - now off amiodarone (Chronic)    Unfortunately, now we do have an EKG that shows recurrence of A. fib. He doesn't seem to be overly symptomatic with it, however this could potentially explain some of his dyspnea. Cardiac present since he is not overly somatic, I think we will just opt for rate control, but we spent quite a bit of time talking about pros and cons as well as risks benefits and indications of oral and coagulation. Plan will be to start Xarelto. This patients CHA2DS2-VASc Score and unadjusted Ischemic Stroke Rate (% per year) is equal to 3.2 % stroke rate/year from a score of 3  Above score calculated as 1 point each if present [CHF, HTN, DM, Vascular=MI/PAD/Aortic Plaque (noted on CXR & Chest CT), Age if 65-74,  or Male]; 2 points each if present [Age > 75, or Stroke/TIA/TE] I reviewed this risk score with him as well as the Has-Bled score and relative risks and benefits for the different DOAC's. For sake of ease of taking a single dose daily medication I chose Xarelto to help increase compliance. We will give samples and he will check with his medication coverage plan  to determine if he would be better off on Xarelto versus ELIQUIS.       Relevant Medications   rivaroxaban (XARELTO) 20 MG TABS tablet   Other Relevant Orders   EKG 12-Lead (Completed)     Total time with patient discussing EKG finding of Afib & need for anticoagulation (CHA2DS2Vasc & HAS-Bled scores discussed) 45 min.  > 50 % of the time was in direct patient counseling using score calculators etc.  Then discussed Riskt vs benefits & indications for DOACs.  Current medicines are reviewed at length with the patient today. (+/- concerns) n/a The following changes have been made: starting Xarelto. --   Patient Instructions  Medication addition  START XARELTO  20 MG ONE TABLET DAILY -- TAKE WITH THE BIGGEST MEAL DAILY.  TAKE SAVING CARD TO PHARMACY FOR 30 DAY FREE SAMPLES (WILL CHECK WITH INSURANCE TO SEE WHICH MEDICATION ON PATIENT'S PLAN  XARELTO VS ELIQUIS)  ONLY USE ADVIL SPARING  NOT EVERY DAY.    Your physician wants you to follow-up in [place]. You will receive a reminder letter in the mail two months in advance. If you don't receive a letter, please call our office to schedule the follow-up appointment.   If you need a refill on your cardiac medications before your next appointment, please call your pharmacy.    Studies Ordered:   Orders Placed This Encounter  Procedures  . EKG 12-Lead      Glenetta Hew, M.D., M.S. Interventional Cardiologist   Pager # [phone] Phone # [phone] [address] [place], [place] [address]

## 2017-03-24 NOTE — Patient Instructions (Addendum)
Medication addition  START XARELTO  20 MG ONE TABLET DAILY -- TAKE WITH THE BIGGEST MEAL DAILY.  TAKE SAVING CARD TO PHARMACY FOR 30 DAY FREE SAMPLES (WILL CHECK WITH INSURANCE TO SEE WHICH MEDICATION ON PATIENT'S PLAN  XARELTO VS ELIQUIS)  ONLY USE ADVIL SPARING  NOT EVERY DAY.    Your physician wants you to follow-up in [place]. You will receive a reminder letter in the mail two months in advance. If you don't receive a letter, please call our office to schedule the follow-up appointment.   If you need a refill on your cardiac medications before your next appointment, please call your pharmacy.

## 2017-03-25 ENCOUNTER — Telehealth: Payer: Self-pay | Admitting: Cardiology

## 2017-03-25 NOTE — Telephone Encounter (Signed)
New message    Pt is calling asking for a call back about medication he was told to take yesterday. He said he was told to call and find out about the pricing. He said he has been on the phone most of the morning and he will not be able to afford this medication. Please call.

## 2017-03-25 NOTE — Telephone Encounter (Signed)
Returned call to patient.  States he is trying to find out how much Xarelto vs Eliquis will cost but needed the dosing.     Patient will try to find out the cost and let us know which will be the best for him.

## 2017-03-26 NOTE — Telephone Encounter (Signed)
SPOKE TO PATIENT  ( XARELTO VS ELIQUIS) PATIENT STATES HE CALLED INSURANCE   THE COST OF MEDICATION  $200 DEDUCTIBLE AND THEN $120 OR MORE FOR 30 DAY SUPPLY. PATIENT STATES HE IS NOT APPLY FOR ASSISTANCE DUE TO INCOME ,BUT IS STILL AWAITING ONE MORE COMPANY. PATIENT CONTACT INSURANCE TO SEE IF ALTERNATIVE ON FORMULARY. WILL CALL BACK .

## 2017-04-01 ENCOUNTER — Encounter: Payer: Self-pay | Admitting: Cardiology

## 2017-04-01 NOTE — Assessment & Plan Note (Signed)
Well-controlled current pressures on benazepril and metoprolol succinate as Spironolactone and amlodipine. He is on a standing dose of Lasix, I think he could probably benefit from taking a little bit more for his leg edema. Also recommended foot elevation and support stockings.

## 2017-04-01 NOTE — Assessment & Plan Note (Signed)
Unfortunately, now we do have an EKG that shows recurrence of A. fib. He doesn't seem to be overly symptomatic with it, however this could potentially explain some of his dyspnea. Cardiac present since he is not overly somatic, I think we will just opt for rate control, but we spent quite a bit of time talking about pros and cons as well as risks benefits and indications of oral and coagulation. Plan will be to start Xarelto. This patients CHA2DS2-VASc Score and unadjusted Ischemic Stroke Rate (% per year) is equal to 3.2 % stroke rate/year from a score of 3  Above score calculated as 1 point each if present [CHF, HTN, DM, Vascular=MI/PAD/Aortic Plaque (noted on CXR & Chest CT), Age if 65-74, or Male]; 2 points each if present [Age > 75, or Stroke/TIA/TE] I reviewed this risk score with him as well as the Has-Bled score and relative risks and benefits for the different DOAC's. For sake of ease of taking a single dose daily medication I chose Xarelto to help increase compliance. We will give samples and he will check with his medication coverage plan to determine if he would be better off on Xarelto versus ELIQUIS.

## 2017-04-01 NOTE — Assessment & Plan Note (Signed)
R>L  Lower extremity sweling that is more related to venous  insufficiency. We  The importance  of support stockings  And intermittent additional odses of lasix.

## 2017-04-05 ENCOUNTER — Telehealth: Payer: Self-pay | Admitting: Cardiology

## 2017-04-06 ENCOUNTER — Telehealth: Payer: Self-pay | Admitting: Cardiology

## 2017-04-06 MED ORDER — RIVAROXABAN 20 MG PO TABS: 20.0000 mg | ORAL_TABLET | Freq: Every day | ORAL | 0 refills | Status: DC

## 2017-04-06 NOTE — Telephone Encounter (Signed)
New Message     Pt c/o medication issue:  1. Name of Medication:  xarelto   2. How are you currently taking this medication (dosage and times per day)? 1x a day   3. Are you having a reaction (difficulty breathing--STAT)?  no  4. What is your medication issue?  Needs some financial assistance , he can not afford to get it filled

## 2017-04-06 NOTE — Telephone Encounter (Signed)
Spoke to patient. Patient states he has not had any luck with assistance with medication- xarelto.   patient will come by office - pick up samples and patient assistance form For [place] [employer] [place],   PATIENT VERBALIZED UNDERSTANDING AND STATES HE WILL COME TOMORROW.

## 2017-04-07 NOTE — Telephone Encounter (Signed)
New Message  Pt call to f/u on paperwork that was to be left at the front desk for pt to pick up for financial assistance with Wynetta Emery and [employer]etta Emery. Please call back to discuss

## 2017-04-07 NOTE — Telephone Encounter (Signed)
Returned call to patient. He spoke with Ivin Booty, RN yesterday about Xarelto - convo copied below. He is calling to make sure paperwork is ready for him to pick up. Checked on this at front desk and paperwork + samples are available for pick up. He states he is not sure he can come today, will likely come tomorrow.   Raiford Simmonds, RN    04/06/17 6:20 PM  Note    Spoke to patient. Patient states he has not had any luck with assistance with medication- xarelto.   patient will come by office - pick up samples and patient assistance form For [place] [employer] [place],   PATIENT VERBALIZED UNDERSTANDING AND STATES HE WILL COME TOMORROW.

## 2017-04-12 ENCOUNTER — Other Ambulatory Visit: Payer: Self-pay | Admitting: Cardiology

## 2017-04-13 ENCOUNTER — Telehealth: Payer: Self-pay | Admitting: Cardiology

## 2017-04-13 NOTE — Telephone Encounter (Signed)
New message    Patient has questions about [name] and [name] form regarding medication assistance. rivaroxaban (XARELTO) 20 MG TABS tablet Take 1 tablet (20 mg total) by mouth daily with supper

## 2017-04-13 NOTE — Telephone Encounter (Signed)
Patient called in asking to speak with Dr. Allison Quarry nurse about his medication assistance paperwork. It was explained that she was out of the office today. Will route to her for her knowledge.

## 2017-04-15 NOTE — Telephone Encounter (Signed)
SPOKE TO PATIENT , PATIENT STATES HE IS FILLING OUT  FORM AND WILL BRING IN WHEN FINISH - PATIENT STATES HE WILL FILL OUT WHAT HE CAN -

## 2017-04-19 ENCOUNTER — Other Ambulatory Visit: Payer: Self-pay | Admitting: Cardiology

## 2017-04-20 NOTE — Telephone Encounter (Signed)
REFILL 

## 2017-04-23 ENCOUNTER — Telehealth: Payer: Self-pay | Admitting: Cardiology

## 2017-04-23 NOTE — Telephone Encounter (Signed)
SPOKE TO PATIENT   PATIENT STATES HE MAILED INFORAMTION . RN INFORMED  WILL TRY FOR SAMPLES UNTIL FIRST OF YEAR  AND THEN TRY FOR ASSISTANCE. PATIENT VERBALIZED UNDERSTANDING - AND WILL DISCUSS OPTION WHEN LOOKING FOR ANEW PLAN FOR 2019

## 2017-04-23 NOTE — Telephone Encounter (Signed)
Follow up      Pt called stating that he has transportation problems.  He has some forms to be dropped off to the nurse.  He is putting them in the mail with Dr [name]/Sharon's name on the envelope in hopes that it will get to the nurse.  You may still call him today if you get a chance.

## 2017-04-23 NOTE — Telephone Encounter (Signed)
°  Follow Up   Pt is calling following up on forms for drug from [place]. Please call.

## 2017-04-24 ENCOUNTER — Telehealth: Payer: Self-pay | Admitting: Cardiology

## 2017-04-24 NOTE — Telephone Encounter (Signed)
New Message  Pt would like to speak with RN to see if he received his flu shot this year. Please call back to discuss

## 2017-04-24 NOTE — Telephone Encounter (Signed)
Patient has been informed that he has not received his flu shot at this office this year. He verbalized his understanding.

## 2017-04-27 ENCOUNTER — Telehealth: Payer: Self-pay | Admitting: *Deleted

## 2017-04-27 DIAGNOSIS — M7989 Other specified soft tissue disorders: Secondary | ICD-10-CM

## 2017-04-27 DIAGNOSIS — Z79899 Other long term (current) drug therapy: Secondary | ICD-10-CM

## 2017-04-27 NOTE — Telephone Encounter (Signed)
-----   Message from Raiford Simmonds, RN sent at 05/22/2016 10:36 AM EDT ----- NEED BMP 05/2017 MAIL OCT 2018

## 2017-04-27 NOTE — Telephone Encounter (Signed)
Mail lab slip and letter

## 2017-05-01 ENCOUNTER — Telehealth: Payer: Self-pay | Admitting: Cardiology

## 2017-05-01 NOTE — Telephone Encounter (Signed)
Please call when you have time.

## 2017-05-01 NOTE — Telephone Encounter (Signed)
Spoke to patient . Wanting to know about letter received for lab- yes labs are needed. Instruction given . Verbalized understanding.

## 2017-05-04 DIAGNOSIS — M5136 Other intervertebral disc degeneration, lumbar region: Secondary | ICD-10-CM | POA: Diagnosis not present

## 2017-05-07 ENCOUNTER — Telehealth: Payer: Self-pay | Admitting: Cardiology

## 2017-05-07 NOTE — Telephone Encounter (Signed)
Spoke with pt, aware he can not get a flu shot when here for lab work. Patient was upset and hung up.

## 2017-05-07 NOTE — Telephone Encounter (Signed)
Pt wants to know when he comes for his lab work can he get his flu shot?

## 2017-05-10 ENCOUNTER — Other Ambulatory Visit: Payer: Self-pay | Admitting: Cardiology

## 2017-05-11 DIAGNOSIS — R69 Illness, unspecified: Secondary | ICD-10-CM | POA: Diagnosis not present

## 2017-05-12 ENCOUNTER — Telehealth: Payer: Self-pay | Admitting: Cardiology

## 2017-05-12 NOTE — Telephone Encounter (Signed)
Returned call to patient no answer.LMTC. 

## 2017-05-12 NOTE — Telephone Encounter (Signed)
New message  Pt verbalized that he is calling for the rn   Pt did not disclose any more information

## 2017-05-12 NOTE — Telephone Encounter (Signed)
Returned call to patient he stated he is almost out of Xarelto.Xarelto 20 mg samples left at front desk.

## 2017-05-12 NOTE — Telephone Encounter (Signed)
New Message ° ° pt verbalized that he is returning call for rn  °

## 2017-05-31 ENCOUNTER — Other Ambulatory Visit: Payer: Self-pay | Admitting: Internal Medicine

## 2017-06-01 ENCOUNTER — Telehealth: Payer: Self-pay | Admitting: Cardiology

## 2017-06-01 NOTE — Telephone Encounter (Signed)
Pt called in stating that he has enough medication to get him through this week, last dose will be Saturday 11/17. Pt checking to see if we have samples as he knows that his pt assistance is not to start until January 2019. Pt would like to talk to [facility] as she know what is going on. Told pt that we don't have any samples in office at this time but will share with Ivin Booty.  Verbalized understanding no additional questions at this time.

## 2017-06-01 NOTE — Telephone Encounter (Signed)
New Message    Patient is following up on samples    Patient calling the office for samples of medication:   1.  What medication and dosage are you requesting samples for?   Xarelto  20mg   2.  Are you currently out of this medication?  You know what this is about

## 2017-06-03 ENCOUNTER — Telehealth: Payer: Self-pay | Admitting: Cardiology

## 2017-06-03 NOTE — Telephone Encounter (Signed)
Pt says he only wants to talk tou,concerning his medicine you have been working on.

## 2017-06-04 MED ORDER — RIVAROXABAN 20 MG PO TABS: 20.0000 mg | ORAL_TABLET | Freq: Every day | ORAL | 0 refills | Status: DC

## 2017-06-04 NOTE — Telephone Encounter (Signed)
Follow Up   Patient calling back stating he still hasn't heard anything from nurse. Requesting call back

## 2017-06-04 NOTE — Telephone Encounter (Signed)
Spoke to patient. Patient samples available for pick up

## 2017-06-05 DIAGNOSIS — M7989 Other specified soft tissue disorders: Secondary | ICD-10-CM | POA: Diagnosis not present

## 2017-06-05 DIAGNOSIS — Z79899 Other long term (current) drug therapy: Secondary | ICD-10-CM | POA: Diagnosis not present

## 2017-06-06 LAB — BASIC METABOLIC PANEL
BUN/Creatinine Ratio: 16 (ref 10–24)
BUN: 18 mg/dL (ref 8–27)
CO2: 19 mmol/L — ABNORMAL LOW (ref 20–29)
Calcium: 9.2 mg/dL (ref 8.6–10.2)
Chloride: 101 mmol/L (ref 96–106)
Creatinine, Ser: 1.15 mg/dL (ref 0.76–1.27)
GFR calc Af Amer: 74 mL/min/{1.73_m2} (ref >59)
GFR calc non Af Amer: 64 mL/min/{1.73_m2} (ref >59)
Glucose: 82 mg/dL (ref 65–99)
Potassium: 3.9 mmol/L (ref 3.5–5.2)
Sodium: 136 mmol/L (ref 134–144)

## 2017-06-09 ENCOUNTER — Other Ambulatory Visit: Payer: Self-pay | Admitting: *Deleted

## 2017-06-09 MED ORDER — RIVAROXABAN 20 MG PO TABS: 20.0000 mg | ORAL_TABLET | Freq: Every day | ORAL | 3 refills | Status: DC

## 2017-06-10 ENCOUNTER — Ambulatory Visit: Payer: Medicare HMO | Admitting: Podiatry

## 2017-06-14 ENCOUNTER — Other Ambulatory Visit: Payer: Self-pay | Admitting: Cardiology

## 2017-06-23 ENCOUNTER — Telehealth: Payer: Self-pay | Admitting: Cardiology

## 2017-06-23 MED ORDER — RIVAROXABAN 20 MG PO TABS: 20.0000 mg | ORAL_TABLET | Freq: Every day | ORAL | 0 refills | Status: DC

## 2017-06-23 NOTE — Telephone Encounter (Signed)
Pt calling to speak to Ivin Booty, asked what the call is regarding, he states "she will know",  He is getting low on the med is all he would say-pls call

## 2017-06-23 NOTE — Telephone Encounter (Signed)
SPOKE TO PATIENT .  SAMPLES  AVAILABLE  FOR PICK UP  INFORMED PATIENT--- PATIENT ASSISTANCE PROGRAM IS STILL IN PROCESS.

## 2017-07-03 ENCOUNTER — Telehealth: Payer: Self-pay | Admitting: Cardiology

## 2017-07-03 MED ORDER — BENAZEPRIL HCL 40 MG PO TABS: 40.0000 mg | ORAL_TABLET | Freq: Every day | ORAL | 1 refills | Status: DC

## 2017-07-03 MED ORDER — PRAVASTATIN SODIUM 40 MG PO TABS: 40.0000 mg | ORAL_TABLET | Freq: Every day | ORAL | 1 refills | Status: DC

## 2017-07-03 MED ORDER — POTASSIUM CHLORIDE CRYS ER 20 MEQ PO TBCR: 20.0000 meq | EXTENDED_RELEASE_TABLET | ORAL | 1 refills | Status: DC

## 2017-07-03 MED ORDER — OMEPRAZOLE 20 MG PO CPDR: 20.0000 mg | DELAYED_RELEASE_CAPSULE | Freq: Every day | ORAL | 1 refills | Status: DC

## 2017-07-03 MED ORDER — METOPROLOL SUCCINATE ER 100 MG PO TB24: 100.0000 mg | ORAL_TABLET | Freq: Every day | ORAL | 1 refills | Status: DC

## 2017-07-03 MED ORDER — AMLODIPINE BESYLATE 10 MG PO TABS: 10.0000 mg | ORAL_TABLET | Freq: Every day | ORAL | 1 refills | Status: DC

## 2017-07-03 MED ORDER — FUROSEMIDE 40 MG PO TABS: 40.0000 mg | ORAL_TABLET | Freq: Every day | ORAL | 1 refills | Status: DC

## 2017-07-03 MED ORDER — SPIRONOLACTONE 25 MG PO TABS: 25.0000 mg | ORAL_TABLET | Freq: Every day | ORAL | 1 refills | Status: DC

## 2017-07-03 NOTE — Telephone Encounter (Signed)
Returned the call to the patient. He has some questions for Dr. Allison Quarry nurse and assistance beginning the first of the year. He has been informed that she is currently out of the office. The message will be routed to her for her knowledge.

## 2017-07-03 NOTE — Telephone Encounter (Signed)
New message   Patient calling with concerns about cost of Xarelto. Please call   Pt c/o medication issue:  1. Name of Medication: rivaroxaban (XARELTO) 20 MG TABS tablet  2. How are you currently taking this medication (dosage and times per day)? As prescribed  3. Are you having a reaction (difficulty breathing--STAT)? No  4. What is your medication issue? Too costly. Wants to know the status of [name] and [name] assistance application.

## 2017-07-03 NOTE — Telephone Encounter (Signed)
New message    Patient will be using [facility] mail order starting Jan 2019. Patient requesting confirmation call when sent to [facility]    *STAT* If patient is at the pharmacy, call can be transferred to refill team.   1. Which medications need to be refilled? (please list name of each medication and dose if known) amLODipine (NORVASC) 10 MG tablet, metoprolol succinate (TOPROL-XL) 100 MG 24 hr tablet, spironolactone (ALDACTONE) 25 MG tablet(Expired), pravastatin (PRAVACHOL) 40 MG tablet, furosemide (LASIX) 40 MG tablet, rivaroxaban (XARELTO) 20 MG TABS tablet, benazepril (LOTENSIN) 40 MG tablet, KLOR-CON M20 20 MEQ tablet, and omeprazole (PRILOSEC) 20 MG capsule   2. Which pharmacy/location (including street and city if local pharmacy) is medication to be sent to? Fax # [phone] [facility]  [phone]  3. Do they need a 30 day or 90 day supply? [place]

## 2017-07-07 NOTE — Telephone Encounter (Signed)
returning call to pt he states that this situation is between him and Ivin Booty, he states that he will await her call back to discuss medical issue. Will forward message to her

## 2017-07-07 NOTE — Telephone Encounter (Signed)
RN CALLED PATIENT, PATIENT AWARE  PATIENT ASSISTANCE IS NOT AVAILABLE .  PATIENT STATES HE CHANGE INSURANCE MEDICATION WILL BE TIER 3 IF OBTAIN FROM MAIL ORDER 3 MONTH SUPPLY. PATIENT WILL ORDER THE FIRST OF JAN 2019.  WILL CONTACT PATIENT WITH SAMPLES WHEN AVAILBLE

## 2017-07-07 NOTE — Telephone Encounter (Signed)
[name] is calling to speak with you about the Xarelto . Please call

## 2017-07-22 ENCOUNTER — Telehealth: Payer: Self-pay | Admitting: Cardiology

## 2017-07-22 MED ORDER — RIVAROXABAN 20 MG PO TABS: 20.0000 mg | ORAL_TABLET | Freq: Every day | ORAL | 0 refills | Status: DC

## 2017-07-22 NOTE — Telephone Encounter (Signed)
New Message    *STAT* If patient is at the pharmacy, call can be transferred to refill team.   1. Which medications need to be refilled? (please list name of each medication and dose if known) Xarelto 20mg   2. Which pharmacy/location (including street and city if local pharmacy) is medication to be sent to?[facility]   3. Do they need a 30 day or 90 day supply? 90   Patient is also  Requesting a possible sample because he only has enough until Monday.

## 2017-07-22 NOTE — Telephone Encounter (Signed)
Disregard

## 2017-07-29 ENCOUNTER — Encounter: Payer: Self-pay | Admitting: Internal Medicine

## 2017-07-29 ENCOUNTER — Other Ambulatory Visit: Payer: Self-pay | Admitting: Internal Medicine

## 2017-07-29 ENCOUNTER — Other Ambulatory Visit (INDEPENDENT_AMBULATORY_CARE_PROVIDER_SITE_OTHER): Payer: Medicare Other

## 2017-07-29 ENCOUNTER — Ambulatory Visit (INDEPENDENT_AMBULATORY_CARE_PROVIDER_SITE_OTHER): Payer: Medicare Other | Admitting: Internal Medicine

## 2017-07-29 VITALS — BP 146/88 | HR 74 | Temp 98.5°F | Ht 73.0 in | Wt 293.0 lb

## 2017-07-29 DIAGNOSIS — I5032 Chronic diastolic (congestive) heart failure: Secondary | ICD-10-CM

## 2017-07-29 DIAGNOSIS — R0609 Other forms of dyspnea: Secondary | ICD-10-CM | POA: Diagnosis not present

## 2017-07-29 DIAGNOSIS — I110 Hypertensive heart disease with heart failure: Secondary | ICD-10-CM

## 2017-07-29 DIAGNOSIS — J449 Chronic obstructive pulmonary disease, unspecified: Secondary | ICD-10-CM | POA: Diagnosis not present

## 2017-07-29 DIAGNOSIS — Z1159 Encounter for screening for other viral diseases: Secondary | ICD-10-CM | POA: Diagnosis not present

## 2017-07-29 DIAGNOSIS — Z0001 Encounter for general adult medical examination with abnormal findings: Secondary | ICD-10-CM

## 2017-07-29 DIAGNOSIS — E785 Hyperlipidemia, unspecified: Secondary | ICD-10-CM

## 2017-07-29 DIAGNOSIS — K635 Polyp of colon: Secondary | ICD-10-CM

## 2017-07-29 DIAGNOSIS — R7302 Impaired glucose tolerance (oral): Secondary | ICD-10-CM | POA: Diagnosis not present

## 2017-07-29 DIAGNOSIS — Z23 Encounter for immunization: Secondary | ICD-10-CM

## 2017-07-29 DIAGNOSIS — R0600 Dyspnea, unspecified: Secondary | ICD-10-CM

## 2017-07-29 LAB — HEPATIC FUNCTION PANEL
ALT: 11 U/L (ref 0–53)
AST: 20 U/L (ref 0–37)
Albumin: 3.8 g/dL (ref 3.5–5.2)
Alkaline Phosphatase: 103 U/L (ref 39–117)
Bilirubin, Direct: 0.2 mg/dL (ref 0.0–0.3)
Total Bilirubin: 0.6 mg/dL (ref 0.2–1.2)
Total Protein: 8.5 g/dL — ABNORMAL HIGH (ref 6.0–8.3)

## 2017-07-29 LAB — BASIC METABOLIC PANEL
BUN: 15 mg/dL (ref 6–23)
CO2: 25 mEq/L (ref 19–32)
Calcium: 9.2 mg/dL (ref 8.4–10.5)
Chloride: 102 mEq/L (ref 96–112)
Creatinine, Ser: 1.16 mg/dL (ref 0.40–1.50)
GFR: 79.9 mL/min (ref >60.00)
Glucose, Bld: 130 mg/dL — ABNORMAL HIGH (ref 70–99)
Potassium: 4.1 mEq/L (ref 3.5–5.1)
Sodium: 137 mEq/L (ref 135–145)

## 2017-07-29 LAB — URINALYSIS, ROUTINE W REFLEX MICROSCOPIC
Leukocytes, UA: NEGATIVE
Nitrite: NEGATIVE
Specific Gravity, Urine: 1.025 (ref 1.000–1.030)
Total Protein, Urine: 100 — AB
Urine Glucose: NEGATIVE
Urobilinogen, UA: 1 (ref 0.0–1.0)
pH: 6 (ref 5.0–8.0)

## 2017-07-29 LAB — LIPID PANEL
Cholesterol: 138 mg/dL (ref 0–200)
HDL: 32.3 mg/dL — ABNORMAL LOW (ref >39.00)
LDL Cholesterol: 93 mg/dL (ref 0–99)
NonHDL: 105.38
Total CHOL/HDL Ratio: 4
Triglycerides: 63 mg/dL (ref 0.0–149.0)
VLDL: 12.6 mg/dL (ref 0.0–40.0)

## 2017-07-29 LAB — CBC WITH DIFFERENTIAL/PLATELET
Basophils Absolute: 0.1 10*3/uL (ref 0.0–0.1)
Basophils Relative: 1 % (ref 0.0–3.0)
Eosinophils Absolute: 0.1 10*3/uL (ref 0.0–0.7)
Eosinophils Relative: 1 % (ref 0.0–5.0)
HCT: 44.3 % (ref 39.0–52.0)
Hemoglobin: 14.4 g/dL (ref 13.0–17.0)
Lymphocytes Relative: 29.1 % (ref 12.0–46.0)
Lymphs Abs: 1.7 10*3/uL (ref 0.7–4.0)
MCHC: 32.6 g/dL (ref 30.0–36.0)
MCV: 89.6 fl (ref 78.0–100.0)
Monocytes Absolute: 0.8 10*3/uL (ref 0.1–1.0)
Monocytes Relative: 12.8 % — ABNORMAL HIGH (ref 3.0–12.0)
Neutro Abs: 3.3 10*3/uL (ref 1.4–7.7)
Neutrophils Relative %: 56.1 % (ref 43.0–77.0)
Platelets: 223 10*3/uL (ref 150.0–400.0)
RBC: 4.94 Mil/uL (ref 4.22–5.81)
RDW: 14.8 % (ref 11.5–15.5)
WBC: 5.9 10*3/uL (ref 4.0–10.5)

## 2017-07-29 LAB — TSH: TSH: 1 u[IU]/mL (ref 0.35–4.50)

## 2017-07-29 LAB — HEMOGLOBIN A1C: Hgb A1c MFr Bld: 7.6 % — ABNORMAL HIGH (ref 4.6–6.5)

## 2017-07-29 LAB — HEPATITIS C ANTIBODY
Hepatitis C Ab: NONREACTIVE
SIGNAL TO CUT-OFF: 0.17 (ref <1.00)

## 2017-07-29 LAB — PSA: PSA: 0.09 ng/mL — ABNORMAL LOW (ref 0.10–4.00)

## 2017-07-29 MED ORDER — METFORMIN HCL ER 500 MG PO TB24: 500.0000 mg | ORAL_TABLET | Freq: Every day | ORAL | 3 refills | Status: DC

## 2017-07-29 NOTE — Progress Notes (Signed)
Subjective:    Patient ID: [name], male    DOB: September 07, 1945, 72 y.o.   MRN: [id number]  HPI  Here for wellness and f/u;  Overall doing ok;  Pt denies Chest pain, wheezing, orthopnea, PND, worsening LE edema, palpitations, dizziness or syncope.  Pt denies neurological change such as new headache, facial or extremity weakness.  Pt denies polydipsia, polyuria, or low sugar symptoms. Pt states overall good compliance with treatment and medications, good tolerability, and has been trying to follow appropriate diet.  Pt denies worsening depressive symptoms, suicidal ideation or panic. No fever, night sweats, wt loss, loss of appetite, or other constitutional symptoms.  Pt states good ability with ADL's, has low fall risk, home safety reviewed and adequate, no other significant changes in hearing or vision, and only occasionally active with exercise. Also c/o dyspnea with exertion, chronic, sometimes without exertion, has seen cardiology with discussion several times, ? Deconditioning, had a neg stress test and Echo recently  Less exercise due to breathing and more wt gain Wt Readings from Last 3 Encounters:  07/29/17 293 lb (132.9 kg)  03/24/17 270 lb 9.6 oz (122.7 kg)  09/18/16 276 lb 3.2 oz (125.3 kg)  Asks for omeprazole refill - Denies worsening reflux, abd pain, dysphagia, n/v, bowel change or blood, as long as he takes his meds.  Has optho appt f/u later this year, plans to go before his bday, as money and insurance better this yr.  No other interval hx or complaints Past Medical History:  Diagnosis Date  . Adenoma 05/2008  . Alcoholism in recovery ([place])   . BPH (benign prostatic hyperplasia)   . Chronic LBP    Hip & Back -- Sees Dr. Maia Petties @ [facility]  . Colon polyps   . Complex renal cyst 12/2010  . Controlled type 2 diabetes mellitus with neuropathy ([place]) 01/2007  . COPD (chronic obstructive pulmonary disease) ([place])   . Diverticulitis of colon   . ED (erectile  dysfunction)    s/p Penile prosthesis (09/2010)  . History of prostate cancer    Dr. Karsten Ro  . History of sick sinus syndrome    reduced BB dose for Bradycardia  . HLD (hyperlipidemia)   . Hypertension   . Impaired glucose tolerance 12/21/2013  . Nephrolithiasis   . Osteoarthritis of both hips   . Osteoarthritis of lumbosacral spine    with Disc Disease  . PAF (paroxysmal atrial fibrillation) ([facility])    No longer on Amiodarone.  Not on Anticoaguation b/c no recurrence.  . Paresthesias/numbness    Bilateral LE  . Peripheral neuropathy 12/21/2013   Past Surgical History:  Procedure Laterality Date  . CARDIAC CATHETERIZATION  2003   Normal Coronary Arteries.  Marland Kitchen NM MYOVIEW LTD  03/2016   No Ischemia or Infarct - Visual EF ~60% (computer EF ~39%) - by Echo 55-60%  . PENILE PROSTHESIS IMPLANT  06/21/2012   Procedure: PENILE PROTHESIS INFLATABLE;  Surgeon: Claybon Jabs, MD;  Location: [facility];  Service: Urology;  Laterality: N/A;  REMOVAL AND REPLACEMENT OF SOME  OF PROSTHESIS ([facility])   . PENILE PROSTHESIS PLACEMENT  09/2010  . PROSTATECTOMY    . TRANSTHORACIC ECHOCARDIOGRAM  04/2016   Relatively normal EF of 55-60%. Normal wall motion, suggesting no prior MI. There is grade 2 diastolic dysfunction (pseudo-normal filling pattern) along with moderately dilated left atrium.    reports that he quit smoking about 34 years ago. His smoking use included cigarettes. he  has never used smokeless tobacco. He reports that he drinks alcohol. He reports that he does not use drugs. family history includes Coronary artery disease in his father and mother; Diabetes in his father; Heart attack in his mother; Prostate cancer in his father. Allergies  Allergen Reactions  . Ciprofloxacin Other (See Comments)    All over weakness   Current Outpatient Medications on File Prior to Visit  Medication Sig Dispense Refill  . amLODipine (NORVASC) 10 MG tablet Take 1 tablet (10 mg total) by mouth  daily. 90 tablet 1  . aspirin 81 MG EC tablet Take 81 mg by mouth daily.      . benazepril (LOTENSIN) 40 MG tablet Take 1 tablet (40 mg total) by mouth daily. 90 tablet 1  . furosemide (LASIX) 40 MG tablet Take 1 tablet (40 mg total) by mouth daily. 90 tablet 1  . ibuprofen (ADVIL,MOTRIN) 200 MG tablet Take 200 mg by mouth as directed.      . metoprolol succinate (TOPROL-XL) 100 MG 24 hr tablet Take 1 tablet (100 mg total) by mouth daily. 90 tablet 1  . omeprazole (PRILOSEC) 20 MG capsule Take 1 capsule (20 mg total) by mouth daily. 90 capsule 1  . potassium chloride SA (KLOR-CON M20) 20 MEQ tablet Take 1 tablet (20 mEq total) by mouth every other day. 90 tablet 1  . pravastatin (PRAVACHOL) 40 MG tablet Take 1 tablet (40 mg total) by mouth daily. 90 tablet 1  . rivaroxaban (XARELTO) 20 MG TABS tablet Take 1 tablet (20 mg total) by mouth daily with supper. 90 tablet 0  . spironolactone (ALDACTONE) 25 MG tablet Take 1 tablet (25 mg total) by mouth daily. 90 tablet 1   No current facility-administered medications on file prior to visit.      Review of Systems Constitutional: Negative for other unusual diaphoresis, sweats, appetite or weight changes HENT: Negative for other worsening hearing loss, ear pain, facial swelling, mouth sores or neck stiffness.   Eyes: Negative for other worsening pain, redness or other visual disturbance.  Respiratory: Negative for other stridor or swelling Cardiovascular: Negative for other palpitations or other chest pain  Gastrointestinal: Negative for worsening diarrhea or loose stools, blood in stool, distention or other pain Genitourinary: Negative for hematuria, flank pain or other change in urine volume.  Musculoskeletal: Negative for myalgias or other joint swelling.  Skin: Negative for other color change, or other wound or worsening drainage.  Neurological: Negative for other syncope or numbness. Hematological: Negative for other adenopathy or  swelling Psychiatric/Behavioral: Negative for hallucinations, other worsening agitation, SI, self-injury, or new decreased concentration All other system neg per pt    Objective:   Physical Exam BP (!) 146/88   Pulse 74   Temp 98.5 F (36.9 C) (Oral)   Ht 6\' 1"  (1.854 m)   Wt 293 lb (132.9 kg)   SpO2 98%   BMI 38.66 kg/m  VS noted, obese Constitutional: Pt is oriented to person, place, and time. Appears well-developed and well-nourished, in no significant distress and comfortable Head: Normocephalic and atraumatic  Eyes: Conjunctivae and EOM are normal. Pupils are equal, round, and reactive to light Right Ear: External ear normal without discharge Left Ear: External ear normal without discharge Nose: Nose without discharge or deformity Mouth/Throat: Oropharynx is without other ulcerations and moist  Neck: Normal range of motion. Neck supple. No JVD present. No tracheal deviation present or significant neck LA or mass Cardiovascular: Normal rate, regular rhythm, normal heart sounds and  intact distal pulses.   Pulmonary/Chest: WOB normal and breath sounds without rales or wheezing  Abdominal: Soft. Bowel sounds are normal. NT. No HSM  Musculoskeletal: Normal range of motion. Lymphadenopathy: Has no other cervical adenopathy.  Neurological: Pt is alert and oriented to person, place, and time. Pt has normal reflexes. No cranial nerve deficit. Motor grossly intact, Gait intact Skin: Skin is warm and dry. No rash noted or new ulcerations Psychiatric:  Has normal mood and affect. Behavior is normal without agitation Trace to 1+ bilat LE edema   Echocardiogram: 05/13/2016 - summary Relatively normal EF of 55-60%. Normal wall motion, suggesting no prior MI. There is grade 2 diastolic dysfunction (pseudo-normal filling pattern) along with moderately dilated left atrium.  Lab Results  Component Value Date   WBC 5.6 07/05/2014   HGB 13.9 07/05/2014   HCT 42.5 07/05/2014   PLT 194.0  07/05/2014   GLUCOSE 82 06/05/2017   CHOL 132 07/05/2014   TRIG 45.0 07/05/2014   HDL 35.90 (L) 07/05/2014   LDLCALC 87 07/05/2014   ALT 30 07/05/2014   AST 32 07/05/2014   NA 136 06/05/2017   K 3.9 06/05/2017   CL 101 06/05/2017   CREATININE 1.15 06/05/2017   BUN 18 06/05/2017   CO2 19 (L) 06/05/2017   TSH 0.755 11/15/2013   PSA 0.01 (L) 08/08/2008   INR 1.63 (H) 08/26/2010   HGBA1C 6.5 07/05/2014   MICROALBUR 1.4 08/27/2009      Assessment & Plan:

## 2017-07-29 NOTE — Patient Instructions (Addendum)
You had the pneumovax pneumonia shot today  Please continue all other medications as before, and refills have been done if requested.  Please have the pharmacy call with any other refills you may need.  Please continue your efforts at being more active, low cholesterol diet, and weight control.  You are otherwise up to date with prevention measures today.  Please keep your appointments with your specialists as you may have planned  You will be contacted regarding the referral for: colonoscopy  Please go to the LAB in the Basement (turn left off the elevator) for the tests to be done today  You will be contacted by phone if any changes need to be made immediately.  Otherwise, you will receive a letter about your results with an explanation, but please check with MyChart first.  Please remember to sign up for MyChart if you have not done so, as this will be important to you in the future with finding out test results, communicating by private email, and scheduling acute appointments online when needed.  Please return in 6 months, or sooner if needed

## 2017-07-30 ENCOUNTER — Telehealth: Payer: Self-pay

## 2017-07-30 DIAGNOSIS — E119 Type 2 diabetes mellitus without complications: Secondary | ICD-10-CM

## 2017-07-30 NOTE — Telephone Encounter (Signed)
-----   Message from Biagio Borg, MD sent at 07/29/2017  5:20 PM EST ----- Letter sent, cont same tx except  The test results show that your current treatment is OK, except the A1c is mildly high, and to the level that we should start medication.  Please start metformin ER 500 mg - 1 per day.  A new prescription will be sent, and you should be notified as well from the office.  Shirron to please inform pt, I will do rx

## 2017-07-30 NOTE — Telephone Encounter (Signed)
Pt has been informed and expressed understanding.  

## 2017-07-30 NOTE — Telephone Encounter (Signed)
Pt has been informed and expressed understanding. I did have no explain to him about diabetes and informed we could do a referral for diabetic education for him to get familiar with good food choices and other questions that he may have and he expressed great interest. Can we do that referral for him? Please advise. I will call pt back with additional information.

## 2017-07-30 NOTE — Addendum Note (Signed)
Addended by: Biagio Borg on: 07/30/2017 01:01 PM   Modules accepted: Orders

## 2017-07-31 DIAGNOSIS — Z0001 Encounter for general adult medical examination with abnormal findings: Secondary | ICD-10-CM | POA: Insufficient documentation

## 2017-07-31 NOTE — Assessment & Plan Note (Signed)
PFT's reviewed with pt, suspect an element of restrictive lung dz and obesity, to cont work on diet, wt control  In addition to the time spent performing CPE, I spent an additional 15 minutes face to face,in which greater than 50% of this time was spent in counseling and coordination of care for patient's illness as documented, including the differential dx, treatment, further evaluation and other management of DOE with abnormal PFT, copd, HLD, HTN, and hyperglycemia

## 2017-07-31 NOTE — Assessment & Plan Note (Signed)
Lab Results  Component Value Date   LDLCALC 93 07/29/2017  stable overall by history and exam, recent data reviewed with pt, and pt to continue medical treatment as before,  to f/u any worsening symptoms or concerns

## 2017-07-31 NOTE — Assessment & Plan Note (Signed)
BP Readings from Last 3 Encounters:  07/29/17 (!) 146/88  03/24/17 130/68  03/04/17 (!) 145/76  mild elevated today, pt declines tx change, to cont to work on diet, wt control

## 2017-07-31 NOTE — Assessment & Plan Note (Signed)
stable overall by history and exam, recent data reviewed with pt, and pt to continue medical treatment as before,  to f/u any worsening symptoms or concerns Lab Results  Component Value Date   HGBA1C 7.6 (H) 07/29/2017  mild elevated, declines med change, to cont to work on diet, wt control

## 2017-07-31 NOTE — Assessment & Plan Note (Signed)
stable overall by history and exam, and pt to continue medical treatment as before,  to f/u any worsening symptoms or concerns 

## 2017-08-01 ENCOUNTER — Other Ambulatory Visit: Payer: Self-pay | Admitting: Cardiology

## 2017-08-25 ENCOUNTER — Encounter: Payer: Self-pay | Admitting: Dietician

## 2017-08-25 ENCOUNTER — Encounter: Payer: Medicare Other | Attending: Internal Medicine | Admitting: Dietician

## 2017-08-25 DIAGNOSIS — E119 Type 2 diabetes mellitus without complications: Secondary | ICD-10-CM

## 2017-08-25 DIAGNOSIS — Z713 Dietary counseling and surveillance: Secondary | ICD-10-CM | POA: Insufficient documentation

## 2017-08-25 NOTE — Progress Notes (Signed)
Patient was seen on 08/25/17 for the first of a series of three diabetes self-management courses at the [facility].  Patient Education Plan per assessed needs and concerns is to attend three course education program for Diabetes Self Management Education.  The following learning objectives were met by the patient during this class:  Describe diabetes  State some common risk factors for diabetes  Defines the role of glucose and insulin  Identifies type of diabetes and pathophysiology  Describe the relationship between diabetes and cardiovascular risk  State the members of the Healthcare Team  States the rationale for glucose monitoring  State when to test glucose  State their individual Target Range  State the importance of logging glucose readings  Describe how to interpret glucose readings  Identifies A1C target  Explain the correlation between A1c and eAG values  State symptoms and treatment of high blood glucose  State symptoms and treatment of low blood glucose  Explain proper technique for glucose testing  Identifies proper sharps disposal  Handouts given during class include:  Living Well with Diabetes book  Carb Counting and Meal Planning book  Meal Plan Card  Meal planning worksheet  Low Sodium Flavoring Tips  Types of Fats  The diabetes portion plate  A1c to eAG Conversion Chart  Diabetes Recommended Care Schedule  Support Group  Diabetes Success Plan  Core Class Satisfaction Survey   Follow-Up Plan:  Attend core 2   

## 2017-08-31 ENCOUNTER — Other Ambulatory Visit: Payer: Self-pay

## 2017-08-31 MED ORDER — POTASSIUM CHLORIDE CRYS ER 20 MEQ PO TBCR: 20.0000 meq | EXTENDED_RELEASE_TABLET | ORAL | 3 refills | Status: DC

## 2017-09-01 ENCOUNTER — Encounter: Payer: Self-pay | Admitting: Internal Medicine

## 2017-09-01 ENCOUNTER — Telehealth: Payer: Self-pay | Admitting: Internal Medicine

## 2017-09-01 ENCOUNTER — Encounter: Payer: Medicare Other | Admitting: Dietician

## 2017-09-01 DIAGNOSIS — E119 Type 2 diabetes mellitus without complications: Secondary | ICD-10-CM

## 2017-09-01 DIAGNOSIS — E1165 Type 2 diabetes mellitus with hyperglycemia: Secondary | ICD-10-CM

## 2017-09-01 DIAGNOSIS — Z713 Dietary counseling and surveillance: Secondary | ICD-10-CM | POA: Diagnosis not present

## 2017-09-01 MED ORDER — LANCETS MISC: 0 refills | Status: DC

## 2017-09-01 MED ORDER — ONETOUCH ULTRA 2 W/DEVICE KIT: PACK | 0 refills | Status: DC

## 2017-09-01 MED ORDER — GLUCOSE BLOOD VI STRP: ORAL_STRIP | 12 refills | Status: DC

## 2017-09-01 NOTE — Telephone Encounter (Addendum)
Please advise. I do not see a documented diagnosis of DM to send in. Patient would like a follow up call.

## 2017-09-01 NOTE — Telephone Encounter (Signed)
Copied from [place]. Topic: Inquiry >> Sep 01, 2017  3:12 PM Bennye Alm wrote: Patient called because he needs to talk to Dr. Gwynn Burly assistant in regards to a diabetic testing kit and what all come along with that and how he has to go about getting one.

## 2017-09-01 NOTE — Telephone Encounter (Signed)
Pt does have new Diagnosis DM -   Ok for rx to pharmacy for glucometer and supplies

## 2017-09-01 NOTE — Progress Notes (Signed)
Patient was seen on 09/01/17 for the second of a series of three diabetes self-management courses at the [facility]. The following learning objectives were met by the patient during this class:   Describe the role of different macronutrients on glucose  Explain how carbohydrates affect blood glucose  State what foods contain the most carbohydrates  Demonstrate carbohydrate counting  Demonstrate how to read Nutrition Facts food label  Describe effects of various fats on heart health  Describe the importance of good nutrition for health and healthy eating strategies  Describe techniques for managing your shopping, cooking and meal planning  List strategies to follow meal plan when dining out  Describe the effects of alcohol on glucose and how to use it safely  Goals:  Follow Diabetes Meal Plan as instructed  Aim to spread carbs evenly throughout the day  Aim for 3 meals per day and snacks as needed Include lean protein foods to meals/snacks  Monitor glucose levels as instructed by your doctor   Follow-Up Plan:  Attend Core 3  Work towards following your personal food plan.

## 2017-09-01 NOTE — Addendum Note (Signed)
Addended by: Biagio Borg on: 09/01/2017 05:16 PM   Modules accepted: Orders

## 2017-09-02 ENCOUNTER — Encounter: Payer: Self-pay | Admitting: Gastroenterology

## 2017-09-02 ENCOUNTER — Telehealth: Payer: Self-pay | Admitting: Internal Medicine

## 2017-09-02 DIAGNOSIS — R3121 Asymptomatic microscopic hematuria: Secondary | ICD-10-CM | POA: Diagnosis not present

## 2017-09-02 DIAGNOSIS — C61 Malignant neoplasm of prostate: Secondary | ICD-10-CM | POA: Diagnosis not present

## 2017-09-02 MED ORDER — GLUCOSE BLOOD VI STRP: ORAL_STRIP | 12 refills | Status: DC

## 2017-09-02 NOTE — Telephone Encounter (Signed)
Pt has been informed and expressed understanding.  

## 2017-09-03 ENCOUNTER — Telehealth: Payer: Self-pay | Admitting: Internal Medicine

## 2017-09-03 NOTE — Telephone Encounter (Signed)
Pt called with multiple questions re: new diagnosis of Type 2 diabetes. Pt is attending classes. Answered questions re: diet and exercise, A1C reference vs. CBG, use of home monitor, foot care. Pt verbalizes understanding of all answers provided. Will continue classes.

## 2017-09-04 ENCOUNTER — Other Ambulatory Visit: Payer: Self-pay | Admitting: Internal Medicine

## 2017-09-04 DIAGNOSIS — H401131 Primary open-angle glaucoma, bilateral, mild stage: Secondary | ICD-10-CM | POA: Diagnosis not present

## 2017-09-04 DIAGNOSIS — H53461 Homonymous bilateral field defects, right side: Secondary | ICD-10-CM | POA: Diagnosis not present

## 2017-09-04 DIAGNOSIS — H2513 Age-related nuclear cataract, bilateral: Secondary | ICD-10-CM | POA: Diagnosis not present

## 2017-09-07 ENCOUNTER — Encounter: Payer: Self-pay | Admitting: Physician Assistant

## 2017-09-08 ENCOUNTER — Encounter: Payer: Medicare Other | Admitting: Dietician

## 2017-09-08 DIAGNOSIS — E1165 Type 2 diabetes mellitus with hyperglycemia: Secondary | ICD-10-CM

## 2017-09-08 DIAGNOSIS — Z713 Dietary counseling and surveillance: Secondary | ICD-10-CM | POA: Diagnosis not present

## 2017-09-08 DIAGNOSIS — E119 Type 2 diabetes mellitus without complications: Secondary | ICD-10-CM | POA: Diagnosis not present

## 2017-09-08 NOTE — Progress Notes (Signed)
Patient was seen on 09/08/17 for the third of a series of three diabetes self-management courses at the [facility].   [place] the amount of activity recommended for healthy living . Describe activities suitable for individual needs . Identify ways to regularly incorporate activity into daily life . Identify barriers to activity and ways to over come these barriers  Identify diabetes medications being personally used and their primary action for lowering glucose and possible side effects . Describe role of stress on blood glucose and develop strategies to address psychosocial issues . Identify diabetes complications and ways to prevent them  Explain how to manage diabetes during illness . Evaluate success in meeting personal goal . Establish 2-3 goals that they will plan to diligently work on  Goals:   I will count my carb choices at most meals and snacks  I will be active minutes or more times a week  I will take my diabetes medications as scheduled  I will eat less unhealthy fats in my diet  I will test my glucose.  To help manage stress by moving around more often.  Your patient has identified these potential barriers to change:  Motivation Stress  Your patient has identified their diabetes self-care support plan as  Family Support    Plan:  Attend Support Group as desired

## 2017-09-18 NOTE — Telephone Encounter (Signed)
Spoke with pharmacist at [facility] and they will be sending out supplies to patient.

## 2017-09-18 NOTE — Telephone Encounter (Signed)
Please advise 

## 2017-09-18 NOTE — Telephone Encounter (Signed)
[facility] needs to know specific directions on when/how many times patient should be testing blood sugar. Please advise. Call back to patient at [phone]

## 2017-09-18 NOTE — Telephone Encounter (Signed)
Since the prescription states "once per day", we'll go with  ONCE per day

## 2017-09-21 ENCOUNTER — Ambulatory Visit: Payer: Medicare Other | Admitting: Cardiology

## 2017-09-21 ENCOUNTER — Encounter: Payer: Self-pay | Admitting: Cardiology

## 2017-09-21 ENCOUNTER — Ambulatory Visit: Payer: Medicare Other | Admitting: Physician Assistant

## 2017-09-21 VITALS — BP 143/79 | HR 51 | Ht 73.0 in | Wt 285.8 lb

## 2017-09-21 DIAGNOSIS — E785 Hyperlipidemia, unspecified: Secondary | ICD-10-CM | POA: Diagnosis not present

## 2017-09-21 DIAGNOSIS — I639 Cerebral infarction, unspecified: Secondary | ICD-10-CM

## 2017-09-21 DIAGNOSIS — I5032 Chronic diastolic (congestive) heart failure: Secondary | ICD-10-CM

## 2017-09-21 DIAGNOSIS — R0609 Other forms of dyspnea: Secondary | ICD-10-CM

## 2017-09-21 DIAGNOSIS — I480 Paroxysmal atrial fibrillation: Secondary | ICD-10-CM

## 2017-09-21 DIAGNOSIS — I110 Hypertensive heart disease with heart failure: Secondary | ICD-10-CM | POA: Diagnosis not present

## 2017-09-21 DIAGNOSIS — E669 Obesity, unspecified: Secondary | ICD-10-CM | POA: Diagnosis not present

## 2017-09-21 DIAGNOSIS — M7989 Other specified soft tissue disorders: Secondary | ICD-10-CM | POA: Diagnosis not present

## 2017-09-21 DIAGNOSIS — R0600 Dyspnea, unspecified: Secondary | ICD-10-CM

## 2017-09-21 MED ORDER — FUROSEMIDE 40 MG PO TABS: ORAL_TABLET | ORAL | 3 refills | Status: DC

## 2017-09-21 MED ORDER — POTASSIUM CHLORIDE CRYS ER 20 MEQ PO TBCR: 20.0000 meq | EXTENDED_RELEASE_TABLET | ORAL | 3 refills | Status: DC

## 2017-09-21 NOTE — Patient Instructions (Addendum)
MEDICATION INSTRUCTION  Sliding scale Lasix: Weigh yourself when you get home, then Daily in the Morning. Your dry weight will be what your scale says on the day you return home.(here is 285 lbs.).   If you gain more than 3 pounds from dry weight: Increase the Lasix dosing to 40 mg in the morning and 40 mg in the afternoon until weight returns to baseline dry weight.       TAKE AN EXTRA POTASSIUM TABLET IF YOU TAKE AN EXTRA LASIX.  If the weight goes down more than 3 pounds from dry weight: Hold Lasix until it returns to baseline dry weight    LABS CMP IN ONE 1 MONTH   Your physician wants you to follow-up in [place] [name]. You will receive a reminder letter in the mail two months in advance. If you don't receive a letter, please call our office to schedule the follow-up appointment.  If you need a refill on your cardiac medications before your next appointment, please call your pharmacy.

## 2017-09-21 NOTE — Progress Notes (Signed)
PCP: Parker Borg, MD  Clinic Note: Chief Complaint  Patient presents with  . Follow-up    6 months;  . Atrial Fibrillation  . Leg Swelling    HPI: [name] is a 72 y.o. male with a PMH below who presents today for 6 month f/u for PAF & edema.. --Unfortunately, after I saw him, I was given a chart from his ophthalmologist (Dr. Katy [name] -that revealed evidence of what appears to have been a left occipital stroke --[name] had no recollection of any symptoms to suggest that he had a stroke. . Former patient of [name] & [name]!!!.   [name] was last seen on March 24 2017 still noting -exertional dyspnea.  Stable. Denied any symptoms of A. fib.  He had had a fall, tripped and then landed on his face.  He was started on Xarelto --   Recent Hospitalizations: none  Studies Personally Reviewed - (if available, images/films reviewed: From Epic Chart or Care Everywhere)  None  Interval History: [name] presents today actually doing okay.  He was a little bit upset about being diagnosed having diabetes and is now starting on glucose strips and metformin.  His A1c was now greater than 7.  He is therefore try to change his diet.  He is trying to start doing some walking type exercise, but is really limited by his back pain.  He still notes right greater than left leg lower extremity edema but is not really that much different than usual.  He does not have any symptoms to speak of as far as feeling rapid irregular heartbeats or palpitations.  Nothing to suggest he  is aware of being in A. fib except for when he Exerts himself and his heart rate goes up.   He does not recall any resting or exertional chest tightness or pressure, but does have exertional dyspnea which is if anything a little better than before. No real PND orthopnea to go along with the edema.  No syncope or near syncope.  And as far as he can tell no TIA or amaurosis fugax  symptoms.  He did not he did mention to me the ophthalmologist's recommendations about cardiology evaluation.  He really has not noticed any bleeding issues on his Xarelto --.  He says he takes Lasix on occasion, but may be not as frequently as he should.  He is not really following his weights. He denies any claudication.   ROS: A comprehensive was performed. Review of Systems  Constitutional: Positive for malaise/fatigue (Pretty much chronic.  Stable but improving) and weight loss (Not eating as much.  Eating smarter and trying to exercise).  HENT: Positive for hearing loss. Negative for congestion and nosebleeds.   Eyes:       No change in vision, blurred vision versus amaurosis fugax symptoms.  This would suggest that perhaps the occipital stroke noted on exam was relatively old.  Respiratory: Positive for shortness of breath and wheezing. Negative for cough.   Cardiovascular:       Per HPI  Gastrointestinal: Positive for constipation. Negative for blood in stool, diarrhea and melena.  Genitourinary: Positive for frequency and urgency (He does note a little bit of polyuria and polydipsia). Negative for hematuria (Not at present).  Musculoskeletal: Positive for back pain and joint pain. Negative for falls (Banged his forehead).  Skin: Negative.   Neurological: Positive for dizziness (If he stands up too fast). Negative for focal weakness.  Endo/Heme/Allergies: Bruises/bleeds  easily.  Psychiatric/Behavioral: Positive for depression (Perhaps dysthymia). Negative for memory loss. The patient does not have insomnia.   All other systems reviewed and are negative.   I have reviewed and (if needed) personally updated the patient's problem list, medications, allergies, past medical and surgical history, social and family history.   Past Medical History:  Diagnosis Date  . Adenoma 05/2008  . Alcoholism in recovery ([place])   . BPH (benign prostatic hyperplasia)   . Chronic LBP    Hip & Back --  Sees Dr. Maia [name] @ [facility]  . Colon polyps   . Complex renal cyst 12/2010  . Controlled type 2 diabetes mellitus with neuropathy ([place]) 01/2007  . COPD (chronic obstructive pulmonary disease) ([place])   . Diabetes ([place])   . Diverticulitis of colon   . ED (erectile dysfunction)    s/p Penile prosthesis (09/2010)  . History of prostate cancer    Dr. Karsten [name]  . History of sick sinus syndrome    reduced BB dose for Bradycardia  . HLD (hyperlipidemia)   . Hypertension   . Impaired glucose tolerance 12/21/2013  . Nephrolithiasis   . Osteoarthritis of both hips   . Osteoarthritis of lumbosacral spine    with Disc Disease  . PAF (paroxysmal atrial fibrillation) (HCC)    No longer on Amiodarone.  Not on Anticoaguation b/c no recurrence.  . Paresthesias/numbness    Bilateral LE  . Peripheral neuropathy 12/21/2013    Past Surgical History:  Procedure Laterality Date  . CARDIAC CATHETERIZATION  2003   Normal Coronary Arteries.  Marland Kitchen NM MYOVIEW LTD  03/2016   No Ischemia or Infarct - Visual EF ~60% (computer EF ~39%) - by Echo 55-60%  . PENILE PROSTHESIS IMPLANT  06/21/2012   Procedure: PENILE PROTHESIS INFLATABLE;  Surgeon: Parker Jabs, MD;  Location: [facility];  Service: Urology;  Laterality: N/A;  REMOVAL AND REPLACEMENT OF SOME  OF PROSTHESIS (AMS)   . PENILE PROSTHESIS PLACEMENT  09/2010  . PROSTATECTOMY    . TRANSTHORACIC ECHOCARDIOGRAM  04/2016   Relatively normal EF of 55-60%. Normal wall motion, suggesting no prior MI. There is grade 2 diastolic dysfunction (pseudo-normal filling pattern) along with moderately dilated left atrium.  LE Venous Duplex (for DVT & Venous Insufficiency): No evidence of DVT or large vein incompetence.   Current Meds  Medication Sig  . amLODipine (NORVASC) 10 MG tablet Take 1 tablet (10 mg total) by mouth daily.  Marland Kitchen aspirin 81 MG EC tablet Take 81 mg by mouth daily.    . benazepril (LOTENSIN) 40 MG tablet Take 1 tablet (40 mg  total) by mouth daily.  . Blood Glucose Monitoring Suppl (ONE TOUCH ULTRA 2) w/Device KIT Use as directed E11.9  . furosemide (LASIX) 40 MG tablet TAKE 1 TO 2 TABLETS DAILY AS NEEDED FOR WEIGHT GAIN FOR 3 LBS OR MORE.  Marland Kitchen glucose blood (ONE TOUCH ULTRA TEST) test strip Use as instructed once per day E11.9  . ibuprofen (ADVIL,MOTRIN) 200 MG tablet Take 200 mg by mouth as directed.    . Lancets MISC Use as directed E11.9  . metFORMIN (GLUCOPHAGE-XR) 500 MG 24 hr tablet Take 1 tablet (500 mg total) by mouth daily with breakfast.  . metoprolol succinate (TOPROL-XL) 100 MG 24 hr tablet Take 1 tablet (100 mg total) by mouth daily.  Marland Kitchen omeprazole (PRILOSEC) 20 MG capsule Take 1 capsule (20 mg total) by mouth daily.  . potassium chloride SA (KLOR-CON M20) 20 MEQ  tablet Take 1 tablet (20 mEq total) by mouth every other day. May take an an additional tablet if you take an extra lasix for weight greater  3 lbs  . pravastatin (PRAVACHOL) 40 MG tablet Take 1 tablet (40 mg total) by mouth daily.  Marland Kitchen spironolactone (ALDACTONE) 25 MG tablet Take 1 tablet (25 mg total) by mouth daily.  Alveda Reasons 20 MG TABS tablet TAKE 1 TABLET BY MOUTH  DAILY WITH SUPPER  . [DISCONTINUED] furosemide (LASIX) 40 MG tablet Take 1 tablet (40 mg total) by mouth daily.  . [DISCONTINUED] potassium chloride SA (KLOR-CON M20) 20 MEQ tablet Take 1 tablet (20 mEq total) by mouth every other day.  . [DISCONTINUED] potassium chloride SA (KLOR-CON M20) 20 MEQ tablet Take 1 tablet (20 mEq total) by mouth every other day. May take an an additional tablet if you take an extra lasix for weight gain more than 3 lbs in weight    Allergies  Allergen Reactions  . Ciprofloxacin Other (See Comments)    All over weakness   Social History   Tobacco Use  . Smoking status: Former Smoker    Types: Cigarettes    Last attempt to quit: 11/04/1982    Years since quitting: 34.9  . Smokeless tobacco: Never Used  Substance Use Topics  . Alcohol use: Yes     Comment: no alcohol x 1 week wants to stop  . 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   Divorced. 3 grown children. 4 GC.     Quit smoking ~20+ yrs ago.     family history includes Coronary artery disease in his father and mother; Diabetes in his father; Heart attack in his mother; Prostate cancer in his father.  Wt Readings from Last 3 Encounters:  09/21/17 285 lb 12.8 oz (129.6 kg)  08/25/17 288 lb (130.6 kg)  07/29/17 293 lb (132.9 kg)  - not eating as much  PHYSICAL EXAM BP (!) 143/79   Pulse (!) 51   Ht _0  (1.854 m)   Wt 285 lb 12.8 oz (129.6 kg)   BMI 37.71 kg/m  Physical Exam  Constitutional: He is oriented to person, place, and time. He appears well-developed and well-nourished. No distress.  Moderately obese. Well groomed  HENT:  Head: Normocephalic and atraumatic.  Eyes: EOM are normal. No scleral icterus.  Neck: Normal range of motion. Neck supple. No hepatojugular reflux and no JVD (Trivial) present. Carotid bruit is not present.  Cardiovascular: S1 normal, S2 normal and intact distal pulses. An irregularly irregular rhythm present. Bradycardia present. PMI is not displaced. Exam reveals distant heart sounds.  Murmur heard.  Medium-pitched harsh early systolic murmur is present with a grade of 1/6 at the upper right sternal border. Pulmonary/Chest: Effort normal. No respiratory distress. He has no wheezes. He has no rales.  Mildly diminished breath sounds, likely due to poor effort  Musculoskeletal: He exhibits edema (2-3 + right leg, minimal on left; mild venous stasis changes).  Slow, wide-based gait. Somewhat shuffling with significant kyphoscoliosis  Neurological: He is alert and oriented to person, place, and time.  Skin: Skin is warm and dry. No erythema.  Psychiatric: He has a normal mood and affect. His behavior is  normal. Judgment and thought content normal.  Nursing note and vitals reviewed.    Adult ECG Report  Rate: 51 ;  Rhythm: atrial fibrillation and (Coarse atrial fibrillation as opposed to be atrial flutter by computer read), borderline left axis deviation (intermediate) nonspecific ST and T-wave changes.;   Narrative Interpretation: Stable EKG   Other studies Reviewed: Additional studies/ records that were reviewed today include:  Recent Labs:    Lab Results  Component Value Date   CREATININE 1.16 07/29/2017   BUN 15 07/29/2017   NA 137 07/29/2017   K 4.1 07/29/2017   CL 102 07/29/2017   CO2 25 07/29/2017   Lab Results  Component Value Date   CHOL 138 07/29/2017   HDL 32.30 (L) 07/29/2017   LDLCALC 93 07/29/2017   TRIG 63.0 07/29/2017   CHOLHDL 4 07/29/2017     ASSESSMENT / PLAN: Problem List Items Addressed This Visit    Exertional dyspnea (Chronic)    He probably has some diastolic dysfunction, and is probably somewhat exacerbated by being in A. fib.  I would prefer to allow him to stay in A. fib and not cardiovert, but would probably want to be little more aggressive with his diuresis and of therefore increased his Lasix prescription to 1-2 tablets a day as directed for weight gain or edema.  Because of the concern about the ophthalmologist evaluation, we will probably want to check an echocardiogram (with bubble study) which will help [place] assess if any change of EF is occurred.      Hyperlipidemia with target LDL less than 100 (Chronic)    Seems to be within target range.  Continue current dose of pravastatin.      Relevant Medications   furosemide (LASIX) 40 MG tablet   Hypertensive heart disease with chronic diastolic congestive heart failure (HCC) (Chronic)    Blood pressure still borderline elevated.  As usually the case, he is reluctant to change.  I think he may be a little bit volume up because he is having some edema off and on.  I wanted to change his  prescription of Lasix to as directed 1-2 tablets daily.  I want him to weigh himself and to take additional dose of Lasix (he can take both together as 80 mg) for weight gain greater than 3 pounds or worsening edema. Cannot titrate up metoprolol any further.  He is on max dose of benazepril and amlodipine.  He is also on spironolactone.  I think for now we decided that the lives with her current blood pressure.  This is been stable for him.  Hopefully as he loses weight, we may see some improvement      Relevant Medications   furosemide (LASIX) 40 MG tablet   Other Relevant Orders   EKG 12-Lead (Completed)   Comprehensive metabolic panel   Obesity (BMI 30-39.9) (Chronic)    He is doing okay with exercise, but is limited by his back pain.  He had been doing okay with exercise, and I continue to encourage him.      Occipital stroke [facility]), left    Suggested by ophthalmology assessment with the right inferior quadrantanopsia.  Best guess is that this could be related to the fact that he was not on anticoagulation with recurrence of A. fib.  Thankfully, he does not seem to be having any symptoms and there is no other findings.  Anticipate brain MRI performed by PCP.  Low threshold to consider checking 2D echocardiogram with bubble study.  However, I do not think we would find anything.      PAF (paroxysmal atrial fibrillation) -  now off amiodarone; asymptomatic. CHA2DS2-VASc Score -3 (on Xarelto) - Primary (Chronic)    Now confirmed A. fib on another EKG.  He is back on Xarelto.  I wonder if the ophthalmic stroke seen by ophthalmology could be related to the fact that he was reluctant to be on anticoagulation until I could prove that he had had a recurrence of A. fib.  I am not sure what else we would need to do further assessment of that besides may be check an echocardiogram. He is on stable dose of metoprolol succinate for rate control with borderline bradycardia if anything else.  No bleeding  on Xarelto with no further stroke symptoms.      Relevant Medications   furosemide (LASIX) 40 MG tablet   Other Relevant Orders   EKG 12-Lead (Completed)   Comprehensive metabolic panel   Swelling of lower extremity (Chronic)    To space on prior data, this is probably related to venous insufficiency.  Continue wearing support stockings.  Continue using additional dose of Lasix as directed.      Relevant Orders   Comprehensive metabolic panel     Total time with patient discussing EKG finding of Afib & need for anticoagulation (CHA2DS2Vasc & HAS-Bled scores discussed) 45 min.  > 50 % of the time was in direct patient counseling using score calculators etc.  Then discussed Riskt vs benefits & indications for DOACs.  Current medicines are reviewed at length with the patient today. (+/- concerns) n/a The following changes have been made: starting Xarelto. --   Patient Instructions  MEDICATION INSTRUCTION  Sliding scale Lasix: Weigh yourself when you get home, then Daily in the Morning. Your dry weight will be what your scale says on the day you return home.(here is 285 lbs.).   If you gain more than 3 pounds from dry weight: Increase the Lasix dosing to 40 mg in the morning and 40 mg in the afternoon until weight returns to baseline dry weight.       TAKE AN EXTRA POTASSIUM TABLET IF YOU TAKE AN EXTRA LASIX.  If the weight goes down more than 3 pounds from dry weight: Hold Lasix until it returns to baseline dry weight    LABS CMP IN ONE 1 MONTH   Your physician wants you to follow-up in [place] [name]. You will receive a reminder letter in the mail two months in advance. If you don't receive a letter, please call our office to schedule the follow-up appointment.  If you need a refill on your cardiac medications before your next appointment, please call your pharmacy.    Studies Ordered:   Orders Placed This Encounter  Procedures  . Comprehensive metabolic panel   . EKG 12-Lead      Glenetta Hew, M.D., M.S. Interventional Cardiologist   Pager # [phone] Phone # [phone] [address] [place], [place] [address]

## 2017-09-23 ENCOUNTER — Encounter: Payer: Self-pay | Admitting: Cardiology

## 2017-09-23 DIAGNOSIS — I639 Cerebral infarction, unspecified: Secondary | ICD-10-CM | POA: Insufficient documentation

## 2017-09-23 NOTE — Assessment & Plan Note (Addendum)
He probably has some diastolic dysfunction, and is probably somewhat exacerbated by being in A. fib.  I would prefer to allow him to stay in A. fib and not cardiovert, but would probably want to be little more aggressive with his diuresis and of therefore increased his Lasix prescription to 1-2 tablets a day as directed for weight gain or edema.  Because of the concern about the ophthalmologist evaluation, we will probably want to check an echocardiogram (with bubble study) which will help [place] assess if any change of EF is occurred.

## 2017-09-23 NOTE — Assessment & Plan Note (Signed)
Now confirmed A. fib on another EKG.  He is back on Xarelto.  I wonder if the ophthalmic stroke seen by ophthalmology could be related to the fact that he was reluctant to be on anticoagulation until I could prove that he had had a recurrence of A. fib.  I am not sure what else we would need to do further assessment of that besides may be check an echocardiogram. He is on stable dose of metoprolol succinate for rate control with borderline bradycardia if anything else.  No bleeding on Xarelto with no further stroke symptoms.

## 2017-09-23 NOTE — Assessment & Plan Note (Signed)
To space on prior data, this is probably related to venous insufficiency.  Continue wearing support stockings.  Continue using additional dose of Lasix as directed.

## 2017-09-23 NOTE — Assessment & Plan Note (Signed)
He is doing okay with exercise, but is limited by his back pain.  He had been doing okay with exercise, and I continue to encourage him.

## 2017-09-23 NOTE — Assessment & Plan Note (Signed)
Seems to be within target range.  Continue current dose of pravastatin.

## 2017-09-23 NOTE — Assessment & Plan Note (Signed)
Suggested by ophthalmology assessment with the right inferior quadrantanopsia.  Best guess is that this could be related to the fact that he was not on anticoagulation with recurrence of A. fib.  Thankfully, he does not seem to be having any symptoms and there is no other findings.  Anticipate brain MRI performed by PCP.  Low threshold to consider checking 2D echocardiogram with bubble study.  However, I do not think we would find anything.

## 2017-09-23 NOTE — Assessment & Plan Note (Signed)
Blood pressure still borderline elevated.  As usually the case, he is reluctant to change.  I think he may be a little bit volume up because he is having some edema off and on.  I wanted to change his prescription of Lasix to as directed 1-2 tablets daily.  I want him to weigh himself and to take additional dose of Lasix (he can take both together as 80 mg) for weight gain greater than 3 pounds or worsening edema. Cannot titrate up metoprolol any further.  He is on max dose of benazepril and amlodipine.  He is also on spironolactone.  I think for now we decided that the lives with her current blood pressure.  This is been stable for him.  Hopefully as he loses weight, we may see some improvement

## 2017-09-28 ENCOUNTER — Telehealth: Payer: Self-pay | Admitting: Cardiology

## 2017-09-28 ENCOUNTER — Ambulatory Visit: Payer: Self-pay

## 2017-09-28 NOTE — Telephone Encounter (Signed)
Spoke to patient.  Need to speak [facility]  To clarify  Direction. Patient aware .

## 2017-09-28 NOTE — Telephone Encounter (Signed)
New message   Please call patient to get specific information that [facility] needs.  Patient called [facility] and they will not cover the change in medication that you made with out getting clarification :  potassium chloride SA (KLOR-CON M20) 20 MEQ tablet Take 1 tablet (20 mEq total) by mouth every other day. May take an an additional tablet if you take an extra lasix for weight greater 3 lbs   furosemide (LASIX) 40 MG tablet TAKE 1 TO 2 TABLETS DAILY AS NEEDED FOR WEIGHT GAIN FOR 3 LBS OR MORE.

## 2017-09-28 NOTE — Telephone Encounter (Signed)
Pt. Called to report [facility] sent him a One Touch glucometer and he does not understand the instructions with it. Nurse appointment made and pt. Stated he did not have strips or lancets with kit. He will call [facility] and reschedule when he has everything he needs.

## 2017-09-30 ENCOUNTER — Ambulatory Visit: Payer: Medicare Other

## 2017-10-16 ENCOUNTER — Ambulatory Visit: Payer: Medicare Other | Admitting: Physician Assistant

## 2017-10-19 ENCOUNTER — Telehealth: Payer: Self-pay | Admitting: Cardiology

## 2017-10-19 NOTE — Telephone Encounter (Signed)
New message    Patient calling to clarify dosage.  Pt c/o medication issue:  1. Name of Medication: furosemide (LASIX) 40 MG tablet and potassium chloride SA (KLOR-CON M20) 20 MEQ tablet  2. How are you currently taking this medication (dosage and times per day)? As prescribed  3. Are you having a reaction (difficulty breathing--STAT)? NO  4. What is your medication issue? Patient is requesting clarification on dosage

## 2017-10-20 ENCOUNTER — Other Ambulatory Visit: Payer: Self-pay | Admitting: Internal Medicine

## 2017-10-26 ENCOUNTER — Other Ambulatory Visit: Payer: Self-pay

## 2017-10-26 MED ORDER — OMEPRAZOLE 20 MG PO CPDR: 20.0000 mg | DELAYED_RELEASE_CAPSULE | Freq: Every day | ORAL | 3 refills | Status: DC

## 2017-10-26 MED ORDER — METOPROLOL SUCCINATE ER 100 MG PO TB24: 100.0000 mg | ORAL_TABLET | Freq: Every day | ORAL | 3 refills | Status: DC

## 2017-10-28 MED ORDER — FUROSEMIDE 40 MG PO TABS: ORAL_TABLET | ORAL | 3 refills | Status: DC

## 2017-10-28 MED ORDER — POTASSIUM CHLORIDE CRYS ER 20 MEQ PO TBCR: EXTENDED_RELEASE_TABLET | ORAL | 3 refills | Status: DC

## 2017-10-28 NOTE — Telephone Encounter (Signed)
Spoke to pharmacist Gaston Islam-  Clarification of furosemide  40 mg and potassium 20 meq given. Quantity of 180  With 3 refills. Both medication have  Been changed to same instructions ( take 1 to 2 tablets as needed for weight gain  Of greater than 3 lbs.or more)  changes medication list.     LEFT MESSAGE ON PATIENT VOICE MAIL THAT MEDICATION WILL BE MAILED TO HIM  ANY QUESTION  MAY

## 2017-10-28 NOTE — Addendum Note (Signed)
Addended by: Raiford Simmonds on: 10/28/2017 02:51 PM   Modules accepted: Orders

## 2017-11-04 DIAGNOSIS — C61 Malignant neoplasm of prostate: Secondary | ICD-10-CM | POA: Diagnosis not present

## 2017-11-11 DIAGNOSIS — C61 Malignant neoplasm of prostate: Secondary | ICD-10-CM | POA: Diagnosis not present

## 2017-11-13 ENCOUNTER — Encounter: Payer: Self-pay | Admitting: Internal Medicine

## 2017-12-14 ENCOUNTER — Other Ambulatory Visit: Payer: Self-pay | Admitting: Cardiology

## 2017-12-15 NOTE — Telephone Encounter (Signed)
Rx sent to pharmacy   

## 2017-12-17 ENCOUNTER — Telehealth: Payer: Self-pay | Admitting: Cardiology

## 2017-12-17 NOTE — Telephone Encounter (Signed)
Pt states he is requesting to speak with Ivin Booty. Call routed.

## 2017-12-17 NOTE — Telephone Encounter (Signed)
New Message:   Please call,questions and concern about a procedure he is going to have.

## 2017-12-18 NOTE — Telephone Encounter (Signed)
Patient is calling . He is thinking of having a removal of penile implant .  Patient states he discuss with his urologist-  Patient states he is concerned about the anesthesia.  He wanted to know Dr Ellyn Hack opinion - in regards to what medication if any needs to be stopped or held. Patient aware will defer Dr Ellyn Hack. No surgery has been set up yet

## 2017-12-20 NOTE — Telephone Encounter (Signed)
The surgery would be a low risk surgery, he would have to hold his Xarelto for 48 hours preop and restart 1 day postop.  Glenetta Hew, MD

## 2017-12-22 NOTE — Telephone Encounter (Signed)
Spoke to patient -- information given. Patient aware Urologist office will need to contact office when surgery is schedule - for formal Cardiac clearance.

## 2017-12-23 ENCOUNTER — Telehealth: Payer: Self-pay | Admitting: Internal Medicine

## 2017-12-23 NOTE — Telephone Encounter (Signed)
Copied from [place] [id number]. Topic: Quick Communication - Rx Refill/Question >> Dec 23, 2017  9:08 AM Scherrie Gerlach wrote: Medication: albuterol (PROVENTIL HFA;VENTOLIN HFA) 108 (90 BASE) MCG/ACT inhaler   Pt has not gotten this Rx in a long time. (2016)  Pt states he has used as needed.  Pt has an appt 01/27/18 Pt would like this sent to  [place], [place] [phone] (Phone) [phone] (Fax)

## 2017-12-24 MED ORDER — ALBUTEROL SULFATE 108 (90 BASE) MCG/ACT IN AEPB: 2.0000 | INHALATION_SPRAY | Freq: Four times a day (QID) | RESPIRATORY_TRACT | 5 refills | Status: DC | PRN

## 2017-12-24 NOTE — Addendum Note (Signed)
Addended by: Biagio Borg on: 12/24/2017 12:00 PM   Modules accepted: Orders

## 2017-12-24 NOTE — Telephone Encounter (Signed)
I'm unsure of how to order/dispense. Please advise.

## 2017-12-24 NOTE — Telephone Encounter (Signed)
[place] for refill please

## 2017-12-24 NOTE — Telephone Encounter (Signed)
Ok, I sent to [facility] as he probably needs it sooner than [facility] can do

## 2017-12-24 NOTE — Telephone Encounter (Signed)
LOV  07/29/17 Dr. Jenny Reichmann Medication is not on medication list.Has an appointment 01/27/18.

## 2017-12-25 ENCOUNTER — Ambulatory Visit: Payer: Self-pay | Admitting: *Deleted

## 2017-12-25 NOTE — Telephone Encounter (Signed)
  Reason for Disposition . Question about upcoming scheduled test, no triage required and triager able to answer question  Answer Assessment - Initial Assessment Questions 1. REASON FOR CALL or QUESTION: "What is your reason for calling today?" or "How can I best help you?" or "What question do you have that I can help answer?"      Pt is having surgery and wanted to ask if he would be ok under anesthesia with him having a new dx COPD and diabetes. Advised pt to talk with the anesthesiologist before surgery.  Protocols used: INFORMATION ONLY CALL-A-AH

## 2017-12-28 MED ORDER — ALBUTEROL SULFATE 108 (90 BASE) MCG/ACT IN AEPB: 2.0000 | INHALATION_SPRAY | Freq: Four times a day (QID) | RESPIRATORY_TRACT | 5 refills | Status: DC | PRN

## 2017-12-28 NOTE — Addendum Note (Signed)
Addended by: Juliet Rude on: 12/28/2017 09:40 AM   Modules accepted: Orders

## 2017-12-28 NOTE — Telephone Encounter (Signed)
Patient is calling and is needing Albuterol Sulfate 108 (90 Base) MCG/ACT AEPB sent to [facility] and not [facility]. Please advise.   [place], [facility]  [address] [place] [address]  Phone: [phone] Fax: [phone]

## 2017-12-29 ENCOUNTER — Telehealth: Payer: Self-pay | Admitting: Cardiology

## 2017-12-29 NOTE — Telephone Encounter (Signed)
Pt takes Xarelto for afib with CHADS2VASc score of 6 (age, HTN, CHF, DM, occipital stroke). Renal function is normal. Recommend only holding Xarelto for 24 hours prior to procedure due to elevated cardiac risk including stroke history.

## 2017-12-29 NOTE — Telephone Encounter (Signed)
New message     [facility] Pre-operative Risk Assessment    Request for surgical clearance:  1. What type of surgery is being performed? REMOVAL OF INFLATABLE PENILE PROSTHESIS  2. When is this surgery scheduled? TBD  3. What type of clearance is required (medical clearance vs. Pharmacy clearance to hold med vs. Both)? BOTH  4. Are there any medications that need to be held prior to surgery and how long? XARELTO, ASPIRIN  5. Practice name and name of physician performing surgery? [facility], DR OTTELIN  6. What is your office phone number [phone]   7.   What is your office fax number [phone]  8.   Anesthesia type (None, local, MAC, general) ? [place] 12/29/2017, 11:32 AM  _________________________________________________________________   (provider comments below)

## 2017-12-29 NOTE — Telephone Encounter (Signed)
LVM instructing pt to call preop clinic back for medical clearance prior to procedure. Last seen by Dr. Ellyn Hack 09/2017.

## 2017-12-30 NOTE — Telephone Encounter (Signed)
Chart reviewed as part of preop coverage. Dr. Ellyn Hack actually cleared pt on 6/2 and provided recs regarding Xarelto.   Patient is calling . He is thinking of having a removal of penile implant .  Patient states he discuss with his urologist-  Patient states he is concerned about the anesthesia.  He wanted to know Dr Ellyn Hack opinion - in regards to what medication if any needs to be stopped or held. Patient aware will defer Dr Ellyn Hack. No surgery has been set up yet  Dr. Rosetta Posner (see telephone note 12/20/17) The surgery would be a low risk surgery, he would have to hold his Xarelto for 48 hours preop and restart 1 day postop.  Glenetta Hew, MD   I will fax to requesting MD and will remove from preop pool.

## 2018-01-01 ENCOUNTER — Telehealth: Payer: Self-pay | Admitting: Internal Medicine

## 2018-01-01 NOTE — Telephone Encounter (Signed)
Copied from [place] [id number]. Topic: Quick Communication - See Telephone Encounter >> Jan 01, 2018 12:19 PM Oliver Pila B wrote: CRM for notification. See Telephone encounter for: 12/29/17.  [facility] called about surgical clearance for the pt, they states they've made attempts at phone calls but havent gotten a response back from the pcp, call to advise @ [phone] ext [phone]

## 2018-01-03 ENCOUNTER — Other Ambulatory Visit: Payer: Self-pay | Admitting: Cardiology

## 2018-01-04 NOTE — Telephone Encounter (Signed)
[name], surgery scheduler at [facility], LVM with side B fax number to resend form and to call back is she has additional questions for me.

## 2018-01-11 ENCOUNTER — Other Ambulatory Visit: Payer: Self-pay | Admitting: Urology

## 2018-01-27 ENCOUNTER — Ambulatory Visit: Payer: Medicare Other | Admitting: Internal Medicine

## 2018-02-01 ENCOUNTER — Ambulatory Visit: Payer: Medicare Other | Admitting: Internal Medicine

## 2018-02-08 NOTE — Progress Notes (Signed)
12-29-17 (Epic) Cardiac Clearance from Dr. Ellyn Hack in Telephone Encounter  09-21-17 (Epic) EKG  05-13-16 (Epic) ECHO  04-17-16 (Stress)

## 2018-02-08 NOTE — Patient Instructions (Addendum)
Forney Valko  02/08/2018   Your procedure is scheduled on: 02-15-18   Report to [facility] Main  Entrance    Report to Admitting at 5:30 AM    Call this number if you have problems the morning of surgery [phone]   Remember: Do not eat food or drink liquids :After Midnight.     Take these medicines the morning of surgery with A SIP OF WATER: Amlodipine (Norvasc), Metoprolol Succinate (Toprol-XL), Omeprazole (Prilosec), and Pravastatin (Pravachol). You may also bring and use your inhaler as needed.   DO NOT TAKE ANY DIABETIC MEDICATIONS DAY OF YOUR SURGERY                               You may not have any metal on your body including hair pins and              piercings  Do not wear jewelry, make-up, lotions, powders, cologne, or deodorant             Men may shave face and neck.   Do not bring valuables to the hospital. [place].  Contacts, dentures or bridgework may not be worn into surgery.     Patients discharged the day of surgery will not be allowed to drive home.  Name and phone number of your driver: Chalmer Zheng [phone]  Special Instructions: N/A              Please read over the following fact sheets you were given: _____________________________________________________________________ How to Manage Your Diabetes Before and After Surgery  Why is it important to control my blood sugar before and after surgery? . Improving blood sugar levels before and after surgery helps healing and can limit problems. . A way of improving blood sugar control is eating a healthy diet by: o  Eating less sugar and carbohydrates o  Increasing activity/exercise o  Talking with your doctor about reaching your blood sugar goals . High blood sugars (greater than 180 mg/dL) can raise your risk of infections and slow your recovery, so you will need to focus on controlling your diabetes during the  weeks before surgery. . Make sure that the doctor who takes care of your diabetes knows about your planned surgery including the date and location.  How do I manage my blood sugar before surgery? . Check your blood sugar at least 4 times a day, starting 2 days before surgery, to make sure that the level is not too high or low. o Check your blood sugar the morning of your surgery when you wake up and every 2 hours until you get to the Short Stay unit. . If your blood sugar is less than 70 mg/dL, you will need to treat for low blood sugar: o Do not take insulin. o Treat a low blood sugar (less than 70 mg/dL) with  cup of clear juice (cranberry or apple), 4 glucose tablets, OR glucose gel. o Recheck blood sugar in 15 minutes after treatment (to make sure it is greater than 70 mg/dL). If your blood sugar is not greater than 70 mg/dL on recheck, call [phone] for further instructions. . Report your blood sugar to the short stay nurse when you get to Short Stay.  Marland Kitchen  If you are admitted to the hospital after surgery: o Your blood sugar will be checked by the staff and you will probably be given insulin after surgery (instead of oral diabetes medicines) to make sure you have good blood sugar levels. o The goal for blood sugar control after surgery is 80-180 mg/dL.   WHAT DO I DO ABOUT MY DIABETES MEDICATION?  Marland Kitchen Do not take oral diabetes medicines (pills) the morning of surgery.  . THE DAY BEFORE SURGERY, take your usual dose of Metformin                [place] - Preparing for Surgery Before surgery, you can play an important role.  Because skin is not sterile, your skin needs to be as free of germs as possible.  You can reduce the number of germs on your skin by washing with CHG (chlorahexidine gluconate) soap before surgery.  CHG is an antiseptic cleaner which kills germs and bonds with the skin to continue killing germs even after washing. Please DO NOT use if you have an allergy to CHG  or antibacterial soaps.  If your skin becomes reddened/irritated stop using the CHG and inform your nurse when you arrive at Short Stay. Do not shave (including legs and underarms) for at least 48 hours prior to the first CHG shower.  You may shave your face/neck. Please follow these instructions carefully:  1.  Shower with CHG Soap the night before surgery and the  morning of Surgery.  2.  If you choose to wash your hair, wash your hair first as usual with your  normal  shampoo.  3.  After you shampoo, rinse your hair and body thoroughly to remove the  shampoo.                           4.  Use CHG as you would any other liquid soap.  You can apply chg directly  to the skin and wash                       Gently with a scrungie or clean washcloth.  5.  Apply the CHG Soap to your body ONLY FROM THE NECK DOWN.   Do not use on face/ open                           Wound or open sores. Avoid contact with eyes, ears mouth and genitals (private parts).                       Wash face,  Genitals (private parts) with your normal soap.             6.  Wash thoroughly, paying special attention to the area where your surgery  will be performed.  7.  Thoroughly rinse your body with warm water from the neck down.  8.  DO NOT shower/wash with your normal soap after using and rinsing off  the CHG Soap.                9.  Pat yourself dry with a clean towel.            10.  Wear clean pajamas.            11.  Place clean sheets on your bed the night of your first shower and do not  sleep with pets. Day of Surgery : Do not apply any lotions/deodorants the morning of surgery.  Please wear clean clothes to the hospital/surgery center.  FAILURE TO FOLLOW THESE INSTRUCTIONS MAY RESULT IN THE CANCELLATION OF YOUR SURGERY PATIENT SIGNATURE_________________________________  NURSE SIGNATURE__________________________________  ________________________________________________________________________

## 2018-02-10 ENCOUNTER — Encounter (HOSPITAL_COMMUNITY): Payer: Self-pay

## 2018-02-10 ENCOUNTER — Other Ambulatory Visit: Payer: Self-pay

## 2018-02-10 ENCOUNTER — Encounter (HOSPITAL_COMMUNITY)
Admission: RE | Admit: 2018-02-10 | Discharge: 2018-02-10 | Disposition: A | Discharge status: Home / Self Care | Payer: Medicare Other | Source: Ambulatory Visit | Attending: Urology | Admitting: Urology

## 2018-02-10 DIAGNOSIS — Z7901 Long term (current) use of anticoagulants: Secondary | ICD-10-CM | POA: Insufficient documentation

## 2018-02-10 DIAGNOSIS — Z7984 Long term (current) use of oral hypoglycemic drugs: Secondary | ICD-10-CM | POA: Insufficient documentation

## 2018-02-10 DIAGNOSIS — Y838 Other surgical procedures as the cause of abnormal reaction of the patient, or of later complication, without mention of misadventure at the time of the procedure: Secondary | ICD-10-CM | POA: Insufficient documentation

## 2018-02-10 DIAGNOSIS — Z79899 Other long term (current) drug therapy: Secondary | ICD-10-CM | POA: Insufficient documentation

## 2018-02-10 DIAGNOSIS — Z7982 Long term (current) use of aspirin: Secondary | ICD-10-CM | POA: Diagnosis not present

## 2018-02-10 DIAGNOSIS — Z01818 Encounter for other preprocedural examination: Secondary | ICD-10-CM | POA: Diagnosis not present

## 2018-02-10 DIAGNOSIS — T83490A Other mechanical complication of penile (implanted) prosthesis, initial encounter: Secondary | ICD-10-CM | POA: Diagnosis not present

## 2018-02-10 HISTORY — DX: Malignant (primary) neoplasm, unspecified: C80.1

## 2018-02-10 LAB — CBC
HCT: 41.6 % (ref 39.0–52.0)
Hemoglobin: 13.7 g/dL (ref 13.0–17.0)
MCH: 29 pg (ref 26.0–34.0)
MCHC: 32.9 g/dL (ref 30.0–36.0)
MCV: 88.1 fL (ref 78.0–100.0)
Platelets: 217 10*3/uL (ref 150–400)
RBC: 4.72 MIL/uL (ref 4.22–5.81)
RDW: 13.8 % (ref 11.5–15.5)
WBC: 5.6 10*3/uL (ref 4.0–10.5)

## 2018-02-10 LAB — BASIC METABOLIC PANEL
Anion gap: 8 (ref 5–15)
BUN: 24 mg/dL — ABNORMAL HIGH (ref 8–23)
CO2: 26 mmol/L (ref 22–32)
Calcium: 9.5 mg/dL (ref 8.9–10.3)
Chloride: 113 mmol/L — ABNORMAL HIGH (ref 98–111)
Creatinine, Ser: 1.28 mg/dL — ABNORMAL HIGH (ref 0.61–1.24)
GFR calc Af Amer: >60 mL/min (ref >60)
GFR calc non Af Amer: 55 mL/min — ABNORMAL LOW (ref >60)
Glucose, Bld: 118 mg/dL — ABNORMAL HIGH (ref 70–99)
Potassium: 4.8 mmol/L (ref 3.5–5.1)
Sodium: 147 mmol/L — ABNORMAL HIGH (ref 135–145)

## 2018-02-10 LAB — HEMOGLOBIN A1C
Hgb A1c MFr Bld: 6.5 % — ABNORMAL HIGH (ref 4.8–5.6)
Mean Plasma Glucose: 139.85 mg/dL

## 2018-02-10 LAB — GLUCOSE, CAPILLARY: Glucose-Capillary: 117 mg/dL — ABNORMAL HIGH (ref 70–99)

## 2018-02-10 NOTE — Progress Notes (Signed)
02-11-18 BMP routed to Dr. Karsten Ro for review

## 2018-02-12 NOTE — Progress Notes (Signed)
Late entry: Pt called to report that he failed to indicate that he has a small area to his right gluteal fold, that has now scabbed over, and is healing approximately. However, pt wanted documentation on file.

## 2018-02-14 MED ORDER — DEXTROSE 5 % IV SOLN
3.0000 g | INTRAVENOUS | Status: AC
  Administered 2018-02-15: 3 g via INTRAVENOUS
  Filled 2018-02-14: qty 3

## 2018-02-14 NOTE — H&P (Signed)
[name] is a 72 year old male with a malfunctioning penile prosthesis.  09/02/17: [name] follows up today after last having seen me over 2 years ago. [name] is status post a robotic prostatectomy in February 2007. [name] comes in today for multiple reasons. His last PSA was drawn on 07/29/17 and was noted to be 0.09. In addition, [name] continues to maintain good continence. His main complaint today is that [name] does have some mild irritation at the head of his penis that [name] feels is related to his penile prosthesis. [name] apparently has never had much success using his penile prosthesis and ideally wishes that this could be removed. However, [name] has significant concerns about undergoing another procedure particularly considering his worsening medical situation including the development of diabetes, atrial fibrillation, and significant dyspnea of unclear etiology. [name] denies any gross hematuria.   12/17/17: [name] came in to see me today because for about 2 weeks now [name] has been having some slight discomfort in the area of the glans that seems to be worsened when [name] wears underwear. [name] said [name] was recently diagnosed with diabetes. [name] had a prosthesis implanted and then required revision because it was not functioning however after the revision the device continued to malfunction and [name] elected not to have anything further done about it. [name] does not report any swelling or redness. [name] has no discomfort in the scrotum. [name] has no voiding symptoms.  His discomfort would be considered mild in severity     ALLERGIES: Cipro TABS    MEDICATIONS: Metformin Hcl 500 mg tablet 1 tablet PO Daily  Metoprolol Succinate 100 mg tablet, extended release 24 hr 1 tablet PO Daily  Omeprazole 20 mg capsule,delayed release 1 capsule PO Daily  Amlodipine Besylate 10 mg tablet 1 tablet PO Daily  Aspir 81 1 PO Daily  Benazepril Hcl 40 mg tablet 1 tablet PO Daily  Furosemide 40 mg tablet 1 tablet PO Daily  Klor-Con 1 PO Daily  Pravastatin  Sodium 40 mg tablet 1 tablet PO Daily  Spironolactone 25 mg tablet 1 tablet PO Daily  Xarelto 20 mg tablet     GU PSH: ESWL - 2014 Insertion IPP - 2012 Repair Multi-comp Penis Pros - 2013 Robotic Radical Prostatectomy - 2009      PSH Notes: Lithotripsy, Colonoscopy (Fiberoptic), Surg Penis Repair Of Inflatable Penile Prosthesis, Surg Penis Insertion Of Penile Prosthesis, Prostatect Retropubic Radical W/ Nerve Sparing Laparoscopic   NON-GU PSH: Diagnostic Colonoscopy - 2014    GU PMH: Microscopic hematuria - 09/02/2017 ED due to arterial insufficiency, Erectile dysfunction due to arterial insufficiency - 2016 Prostate Cancer, Prostate cancer - 2016 History of urolithiasis, Nephrolithiasis - 2014 LLQ pain, Abdominal pain, LLQ (left lower quadrant) - 2014 Renal cyst, Renal cysts, acquired, bilateral - 2014 Urinary Frequency, Increased urinary frequency - 2014      PMH Notes:   1) Prostate cancer: [name] is s/p a BNS RAL radical prostatectomy on September 17, 2005. His PSA has thus far been undetectable since treatment.   TNM stage: pT2a Nx Mx  Gleason score: 3+4=7  Surgical margins: Negative  Pretreatment PSA: 5.5   2) Erectile dysfunction: Pretreatment SHIM was 21. [name] has failed PDE-5 inhibitors. [name] has tried Trimix which has been effective but also resulted in multiple episodes of priapism. [name] eventually underwent placement of an AMS 700 IPP in March 2012 (revised in December 2013) by Dr. Karsten Ro. [name] has had mechanical complications with his IPP and has been offered a consultation with Dr. Gareth Eagle  at [facility].   3) Urinary frequency: [name] has tried Vesicare in the past which did not help.   4) UTI: [name] developed an E.coli UTI in July 2009 with positive blood cultures.     NON-GU PMH: Coronary Artery Disease Diabetes Type 2 Hypertension    FAMILY HISTORY: Prostate Cancer - Runs In Family Urologic Disorder - Runs In Family   SOCIAL HISTORY: None    Notes: Current every day smoker,  Occupation:, Tobacco Use, Alcohol Use   REVIEW OF SYSTEMS:    GU Review Male:   Patient reports erection problems. Patient denies frequent urination, hard to postpone urination, burning/ pain with urination, get up at night to urinate, leakage of urine, stream starts and stops, trouble starting your stream, have to strain to urinate , and penile pain.  Gastrointestinal (Upper):   Patient denies nausea, vomiting, and indigestion/ heartburn.  Gastrointestinal (Lower):   Patient denies diarrhea and constipation.  Constitutional:   Patient denies fever, night sweats, weight loss, and fatigue.  Skin:   Patient denies skin rash/ lesion and itching.  Eyes:   Patient denies blurred vision and double vision.  Ears/ Nose/ Throat:   Patient denies sore throat and sinus problems.  Hematologic/Lymphatic:   Patient denies swollen glands and easy bruising.  Cardiovascular:   Patient denies leg swelling and chest pains.  Respiratory:   Patient denies cough and shortness of breath.  Endocrine:   Patient denies excessive thirst.  Musculoskeletal:   Patient denies back pain and joint pain.  Neurological:   Patient denies headaches and dizziness.  Psychologic:   Patient denies depression and anxiety.   VITAL SIGNS:    Weight 280 lb / 127.01 kg  Height 73 in / 185.42 cm  BP 159/81 mmHg  Pulse 81 /min  BMI 36.9 kg/m   Physical Exam  Constitutional: [name] is oriented to person, place, and time. [name] appears well-developed and well-nourished. No distress.  Moderately obese. Well groomed  HENT:  Head: Normocephalic and atraumatic.  Eyes: EOM are normal. No scleral icterus.  Neck: Normal range of motion. Neck supple. No hepatojugular reflux and no JVD (Trivial) present. Carotid bruit is not present.  Cardiovascular: S1 normal, S2 normal and intact distal pulses. An irregularly irregular rhythm present. Bradycardia present. PMI is not displaced. Exam reveals distant heart sounds.  Murmur heard.  Medium-pitched  harsh early systolic murmur is present with a grade of 1/6 at the upper right sternal border. Pulmonary/Chest: Effort normal. No respiratory distress. [name] has no wheezes. [name] has no rales.  Mildly diminished breath sounds, likely due to poor effort  Musculoskeletal: [name] exhibits edema (2-3 + right leg, minimal on left; mild venous stasis changes).  Slow, wide-based gait. Somewhat shuffling with significant kyphoscoliosis  Neurological: [name] is alert and oriented to person, place, and time.  Skin: Skin is warm and dry. No erythema.  Psychiatric: [name] has a normal mood and affect. His behavior is normal. Judgment and thought content normal.     Notes: The pump is located in the right hemiscrotum. I am unable to inflate the device with the pump. The penis is without tenderness or swelling to palpation.     PAST DATA REVIEWED:  Source Of History:  Patient  Records Review:   Previous Patient Records, POC Tool   11/11/17 11/04/17 01/25/15 01/24/14 11/23/12 06/11/11 11/20/10 05/30/10  PSA  Total PSA <0.015 ng/mL 0.26 ng/mL <0.01  <0.01  <0.01  <0.01  <0.01  <0.01     PROCEDURES:  Urinalysis Dipstick Dipstick Cont'd  Color: Straw Bilirubin: Neg  Appearance: Clear Ketones: Neg  Specific Gravity: 1.015 Blood: Neg  pH: <=5.0 Protein: Neg  Glucose: Neg Urobilinogen: 0.2    Nitrites: Neg    Leukocyte Esterase: Neg    ASSESSMENT/PLAN:      ICD-10 Details  1 NON-GU:   Breakdown (mechanical) of penile (implanted) prosthesis, sequela - T83.410S Stable - At this point [name] indicates [name] is going to give this thought at home and I told him to contact me if [name] wanted to proceed with surgical removal of his prosthesis.          Notes:   We had a very long discussion about the fact that [name] seems to be having some mild discomfort in the glans region with no sign of infection. I told him that having been diagnosed with diabetes does increase ones risk for infection. [name] is extremely concerned about having  to be placed under anesthesia for removal of the device but understands that if the discomfort should worsen this could be a sign that there is infection present and that the prosthesis would have to be removed. Otherwise I told him [name] needed to make a decision as to whether [name] was bothered enough by the discomfort that [name] describes to want to undergo another surgery for removal of the prosthesis or whether this was something [name] could live with. [name] is extremely concerned about anesthesia and we discussed spinal anesthesia as an option. [name] has had multiple surgeries in the past. In addition [name] is currently taking Xarelto and that would need to be held for 2 days prior to his surgery.

## 2018-02-14 NOTE — Discharge Instructions (Addendum)
Postoperative instructions for penile surgery  Wound:   Remove dressing over penis/scrotum in 48 hours. You have small drains in the right lower abdomen and scrotum. After 48 hours, begin pulling the drains out 1 inch per day until seen in follow up.  In most cases your incision will have absorbable sutures that run along the course of your incision and will dissolve within the first 10-20 days. Some will fall out even earlier. Expect some redness as the sutures dissolved but this should occur only around the sutures. If there is generalized redness, especially with increasing pain or swelling, let us know. The penis will very likely get "black and blue" as the blood in the tissues spread. Sometimes the whole penis will turn colors. The black and blue is followed by a yellow and brown color. In time, all the discoloration will go away.  Diet:  You may return to your normal diet within 24 hours following your surgery. You may note some mild nausea and possibly vomiting the first 6-8 hours following surgery. This is usually due to the side effects of anesthesia, and will disappear quite soon. I would suggest clear liquids and a very light meal the first evening following your surgery.  Activity:  Your physical activity should be restricted the first 48 hours. During that time you should remain relatively inactive, moving about only when necessary. During the first 7-10 days following surgery he should avoid lifting any heavy objects (anything greater than 15 pounds), and avoid strenuous exercise. If you work, ask [place] specifically about your restrictions, both for work and home. We will write a note to your employer if needed.  Ice packs can be placed on and off over the penis for the first 48 hours to help relieve the pain and keep the swelling down. Frozen peas or corn in a ZipLock bag can be frozen, used and re-frozen. Fifteen minutes on and 15 minutes off is a reasonable schedule.   Hygiene:  You  may shower 48 hours after your surgery. Tub bathing should be restricted until the seventh day.  Medication:  You may resume Xarelto 48 hours after your procedure.  You will be sent home with some type of pain medication. In many cases you will be sent home with a narcotic pain pill (Vicodin or Tylox). If the pain is not too bad, you may take either Tylenol (acetaminophen) or Advil (ibuprofen) which contain no narcotic agents, and might be tolerated a little better, with fewer side effects. If the pain medication you are sent home with does not control the pain, you will have to let us know. Some narcotic pain medications cannot be given or refilled by a phone call to a pharmacy.  Problems you should report to [place]:   Fever of 101.0 degrees Fahrenheit or greater.  Moderate or severe swelling under the skin incision or involving the scrotum.  Drug reaction such as hives, a rash, nausea or vomiting.

## 2018-02-15 ENCOUNTER — Encounter (HOSPITAL_COMMUNITY): Payer: Self-pay

## 2018-02-15 ENCOUNTER — Ambulatory Visit (HOSPITAL_COMMUNITY): Payer: Medicare Other | Admitting: Anesthesiology

## 2018-02-15 ENCOUNTER — Ambulatory Visit (HOSPITAL_COMMUNITY)
Admission: RE | Admit: 2018-02-15 | Discharge: 2018-02-15 | Disposition: A | Discharge status: Home / Self Care | Payer: Medicare Other | Source: Ambulatory Visit | Attending: Urology | Admitting: Urology

## 2018-02-15 ENCOUNTER — Encounter (HOSPITAL_COMMUNITY)
Admission: RE | Disposition: A | Discharge status: Home / Self Care | Payer: Self-pay | Source: Ambulatory Visit | Attending: Urology

## 2018-02-15 DIAGNOSIS — T83410A Breakdown (mechanical) of penile (implanted) prosthesis, initial encounter: Secondary | ICD-10-CM | POA: Insufficient documentation

## 2018-02-15 DIAGNOSIS — Z7984 Long term (current) use of oral hypoglycemic drugs: Secondary | ICD-10-CM | POA: Diagnosis not present

## 2018-02-15 DIAGNOSIS — Z7901 Long term (current) use of anticoagulants: Secondary | ICD-10-CM | POA: Insufficient documentation

## 2018-02-15 DIAGNOSIS — Z79899 Other long term (current) drug therapy: Secondary | ICD-10-CM | POA: Insufficient documentation

## 2018-02-15 DIAGNOSIS — Z7982 Long term (current) use of aspirin: Secondary | ICD-10-CM | POA: Diagnosis not present

## 2018-02-15 DIAGNOSIS — I480 Paroxysmal atrial fibrillation: Secondary | ICD-10-CM | POA: Diagnosis not present

## 2018-02-15 DIAGNOSIS — Z9079 Acquired absence of other genital organ(s): Secondary | ICD-10-CM | POA: Diagnosis not present

## 2018-02-15 DIAGNOSIS — J449 Chronic obstructive pulmonary disease, unspecified: Secondary | ICD-10-CM | POA: Insufficient documentation

## 2018-02-15 DIAGNOSIS — F17200 Nicotine dependence, unspecified, uncomplicated: Secondary | ICD-10-CM | POA: Diagnosis not present

## 2018-02-15 DIAGNOSIS — E119 Type 2 diabetes mellitus without complications: Secondary | ICD-10-CM | POA: Insufficient documentation

## 2018-02-15 DIAGNOSIS — I5032 Chronic diastolic (congestive) heart failure: Secondary | ICD-10-CM | POA: Diagnosis not present

## 2018-02-15 DIAGNOSIS — I10 Essential (primary) hypertension: Secondary | ICD-10-CM | POA: Diagnosis not present

## 2018-02-15 DIAGNOSIS — I2510 Atherosclerotic heart disease of native coronary artery without angina pectoris: Secondary | ICD-10-CM | POA: Insufficient documentation

## 2018-02-15 DIAGNOSIS — Z8546 Personal history of malignant neoplasm of prostate: Secondary | ICD-10-CM | POA: Diagnosis not present

## 2018-02-15 DIAGNOSIS — Y831 Surgical operation with implant of artificial internal device as the cause of abnormal reaction of the patient, or of later complication, without mention of misadventure at the time of the procedure: Secondary | ICD-10-CM | POA: Diagnosis not present

## 2018-02-15 DIAGNOSIS — I110 Hypertensive heart disease with heart failure: Secondary | ICD-10-CM | POA: Diagnosis not present

## 2018-02-15 HISTORY — PX: REMOVAL OF PENILE PROSTHESIS: SHX6059

## 2018-02-15 LAB — GLUCOSE, CAPILLARY
Glucose-Capillary: 105 mg/dL — ABNORMAL HIGH (ref 70–99)
Glucose-Capillary: 108 mg/dL — ABNORMAL HIGH (ref 70–99)

## 2018-02-15 SURGERY — REMOVAL, PENILE PROSTHESIS
Anesthesia: General

## 2018-02-15 MED ORDER — SODIUM CHLORIDE 0.9 % IV SOLN
INTRAVENOUS | Status: AC
  Filled 2018-02-15: qty 500000

## 2018-02-15 MED ORDER — BUPIVACAINE HCL (PF) 0.25 % IJ SOLN
INTRAMUSCULAR | Status: DC | PRN
  Administered 2018-02-15: 30 mL

## 2018-02-15 MED ORDER — HYDROMORPHONE HCL 1 MG/ML IJ SOLN
0.2500 mg | INTRAMUSCULAR | Status: DC | PRN
  Administered 2018-02-15 (×2): 0.5 mg via INTRAVENOUS

## 2018-02-15 MED ORDER — PROMETHAZINE HCL 25 MG/ML IJ SOLN: 6.2500 mg | INTRAMUSCULAR | Status: DC | PRN

## 2018-02-15 MED ORDER — DEXAMETHASONE SODIUM PHOSPHATE 10 MG/ML IJ SOLN
INTRAMUSCULAR | Status: AC
  Filled 2018-02-15: qty 1

## 2018-02-15 MED ORDER — MIDAZOLAM HCL 5 MG/5ML IJ SOLN
INTRAMUSCULAR | Status: DC | PRN
  Administered 2018-02-15: 2 mg via INTRAVENOUS

## 2018-02-15 MED ORDER — BUPIVACAINE-EPINEPHRINE (PF) 0.5% -1:200000 IJ SOLN
INTRAMUSCULAR | Status: AC
  Filled 2018-02-15: qty 30

## 2018-02-15 MED ORDER — BUPIVACAINE HCL (PF) 0.25 % IJ SOLN
INTRAMUSCULAR | Status: AC
  Filled 2018-02-15: qty 30

## 2018-02-15 MED ORDER — FENTANYL CITRATE (PF) 250 MCG/5ML IJ SOLN
INTRAMUSCULAR | Status: AC
  Filled 2018-02-15: qty 5

## 2018-02-15 MED ORDER — OXYCODONE HCL 5 MG/5ML PO SOLN
5.0000 mg | Freq: Once | ORAL | Status: DC | PRN
  Filled 2018-02-15: qty 5

## 2018-02-15 MED ORDER — OXYCODONE HCL 5 MG PO TABS: 5.0000 mg | ORAL_TABLET | Freq: Once | ORAL | Status: DC | PRN

## 2018-02-15 MED ORDER — PROPOFOL 10 MG/ML IV BOLUS
INTRAVENOUS | Status: DC | PRN
  Administered 2018-02-15: 200 mg via INTRAVENOUS

## 2018-02-15 MED ORDER — EPHEDRINE SULFATE 50 MG/ML IJ SOLN
INTRAMUSCULAR | Status: DC | PRN
  Administered 2018-02-15 (×2): 10 mg via INTRAVENOUS

## 2018-02-15 MED ORDER — HYDROMORPHONE HCL 1 MG/ML IJ SOLN
INTRAMUSCULAR | Status: AC
  Filled 2018-02-15: qty 1

## 2018-02-15 MED ORDER — SODIUM CHLORIDE 0.9 % IV SOLN
INTRAVENOUS | Status: DC | PRN
  Administered 2018-02-15 (×2): 500 mL

## 2018-02-15 MED ORDER — HYDROCODONE-ACETAMINOPHEN 5-325 MG PO TABS: 1.0000 | ORAL_TABLET | ORAL | 0 refills | Status: DC | PRN

## 2018-02-15 MED ORDER — ONDANSETRON HCL 4 MG/2ML IJ SOLN
INTRAMUSCULAR | Status: AC
  Filled 2018-02-15: qty 2

## 2018-02-15 MED ORDER — BACITRACIN ZINC 500 UNIT/GM EX OINT
TOPICAL_OINTMENT | CUTANEOUS | Status: AC
  Filled 2018-02-15: qty 28.35

## 2018-02-15 MED ORDER — LIDOCAINE 2% (20 MG/ML) 5 ML SYRINGE
INTRAMUSCULAR | Status: AC
  Filled 2018-02-15: qty 5

## 2018-02-15 MED ORDER — LACTATED RINGERS IV SOLN
INTRAVENOUS | Status: DC
  Administered 2018-02-15 (×2): via INTRAVENOUS

## 2018-02-15 MED ORDER — PROPOFOL 10 MG/ML IV BOLUS
INTRAVENOUS | Status: AC
  Filled 2018-02-15: qty 20

## 2018-02-15 MED ORDER — CHLORHEXIDINE GLUCONATE 4 % EX LIQD: Freq: Once | CUTANEOUS | Status: DC

## 2018-02-15 MED ORDER — MIDAZOLAM HCL 2 MG/2ML IJ SOLN
INTRAMUSCULAR | Status: AC
  Filled 2018-02-15: qty 2

## 2018-02-15 MED ORDER — FENTANYL CITRATE (PF) 100 MCG/2ML IJ SOLN
INTRAMUSCULAR | Status: DC | PRN
  Administered 2018-02-15: 50 ug via INTRAVENOUS
  Administered 2018-02-15 (×3): 25 ug via INTRAVENOUS
  Administered 2018-02-15: 50 ug via INTRAVENOUS
  Administered 2018-02-15 (×3): 25 ug via INTRAVENOUS

## 2018-02-15 MED ORDER — EPHEDRINE 5 MG/ML INJ
INTRAVENOUS | Status: AC
  Filled 2018-02-15: qty 10

## 2018-02-15 MED ORDER — LIDOCAINE HCL (CARDIAC) PF 100 MG/5ML IV SOSY
PREFILLED_SYRINGE | INTRAVENOUS | Status: DC | PRN
  Administered 2018-02-15: 60 mg via INTRAVENOUS

## 2018-02-15 MED ORDER — AMOXICILLIN-POT CLAVULANATE 875-125 MG PO TABS: 1.0000 | ORAL_TABLET | Freq: Two times a day (BID) | ORAL | 0 refills | Status: AC

## 2018-02-15 MED ORDER — ONDANSETRON HCL 4 MG/2ML IJ SOLN
INTRAMUSCULAR | Status: DC | PRN
  Administered 2018-02-15: 4 mg via INTRAVENOUS

## 2018-02-15 SURGICAL SUPPLY — 65 items
ADH SKN CLS APL DERMABOND .7 (GAUZE/BANDAGES/DRESSINGS) ×1
APL SKNCLS STERI-STRIP NONHPOA (GAUZE/BANDAGES/DRESSINGS)
BAG DECANTER FOR FLEXI CONT (MISCELLANEOUS) ×1 IMPLANT
BAG URINE DRAINAGE (UROLOGICAL SUPPLIES) ×2 IMPLANT
BANDAGE COBAN STERILE 2 (GAUZE/BANDAGES/DRESSINGS) ×1 IMPLANT
BENZOIN TINCTURE PRP APPL 2/3 (GAUZE/BANDAGES/DRESSINGS) ×1 IMPLANT
BLADE EXTENDED COATED 6.5IN (ELECTRODE) ×1 IMPLANT
BLADE HEX COATED 2.75 (ELECTRODE) ×2 IMPLANT
BLADE SURG 15 STRL LF DISP TIS (BLADE) ×1 IMPLANT
BLADE SURG 15 STRL SS (BLADE) ×2
BNDG GAUZE ELAST 4 BULKY (GAUZE/BANDAGES/DRESSINGS) ×2 IMPLANT
CATH FOLEY 2WAY SLVR  5CC 16FR (CATHETERS) ×1
CATH FOLEY 2WAY SLVR 5CC 16FR (CATHETERS) ×1 IMPLANT
CATH ROBINSON RED A/P 18FR (CATHETERS) ×1 IMPLANT
CHLORAPREP W/TINT 26ML (MISCELLANEOUS) ×2 IMPLANT
COVER MAYO STAND STRL (DRAPES) ×2 IMPLANT
COVER SURGICAL LIGHT HANDLE (MISCELLANEOUS) ×2 IMPLANT
DECANTER SPIKE VIAL GLASS SM (MISCELLANEOUS) ×1 IMPLANT
DERMABOND ADVANCED (GAUZE/BANDAGES/DRESSINGS) ×1
DERMABOND ADVANCED .7 DNX12 (GAUZE/BANDAGES/DRESSINGS) IMPLANT
DISSECTOR ROUND CHERRY 3/8 STR (MISCELLANEOUS) ×1 IMPLANT
DRAIN PENROSE 18X1/4 LTX STRL (WOUND CARE) ×3 IMPLANT
DRAPE EXTREMITY T 121X128X90 (DRAPE) ×2 IMPLANT
DRAPE INCISE IOBAN 66X45 STRL (DRAPES) IMPLANT
DRSG TEGADERM 4X4.75 (GAUZE/BANDAGES/DRESSINGS) ×1 IMPLANT
ELECT REM PT RETURN 15FT ADLT (MISCELLANEOUS) ×2 IMPLANT
GAUZE SPONGE 4X4 12PLY STRL (GAUZE/BANDAGES/DRESSINGS) ×2 IMPLANT
GLOVE BIOGEL M 8.0 STRL (GLOVE) ×4 IMPLANT
GLOVE BIOGEL PI IND STRL 6.5 (GLOVE) IMPLANT
GLOVE BIOGEL PI IND STRL 7.0 (GLOVE) IMPLANT
GLOVE BIOGEL PI INDICATOR 6.5 (GLOVE) ×1
GLOVE BIOGEL PI INDICATOR 7.0 (GLOVE) ×2
GLOVE SURG SS PI 7.0 STRL IVOR (GLOVE) ×1 IMPLANT
GOWN STRL REUS W/TWL XL LVL3 (GOWN DISPOSABLE) ×4 IMPLANT
KIT BASIN OR (CUSTOM PROCEDURE TRAY) ×2 IMPLANT
LUBRICANT JELLY K Y 4OZ (MISCELLANEOUS) ×2 IMPLANT
NEEDLE HYPO 22GX1.5 SAFETY (NEEDLE) ×2 IMPLANT
PACK GENERAL/GYN (CUSTOM PROCEDURE TRAY) ×2 IMPLANT
PAD TELFA 2X3 NADH STRL (GAUZE/BANDAGES/DRESSINGS) ×1 IMPLANT
PIN SAFETY STERILE (MISCELLANEOUS) ×2 IMPLANT
PLUG CATH AND CAP STER (CATHETERS) ×2 IMPLANT
RETRACTOR WILSON SYSTEM (INSTRUMENTS) ×2 IMPLANT
SOAP 2 % CHG 4 OZ (WOUND CARE) ×2 IMPLANT
SPONGE LAP 18X18 X RAY DECT (DISPOSABLE) ×1 IMPLANT
SPONGE LAP 4X18 RFD (DISPOSABLE) IMPLANT
STRIP CLOSURE SKIN 1/2X4 (GAUZE/BANDAGES/DRESSINGS) ×1 IMPLANT
SUPPORT SCROTAL LG STRP (MISCELLANEOUS) ×1 IMPLANT
SUT CHROMIC 3 0 SH 27 (SUTURE) ×3 IMPLANT
SUT ETHILON 3 0 PS 1 (SUTURE) ×2 IMPLANT
SUT MNCRL AB 4-0 PS2 18 (SUTURE) ×2 IMPLANT
SUT PDS AB 2-0 CT2 27 (SUTURE) ×6 IMPLANT
SUT PDS AB 3-0 SH 27 (SUTURE) ×2 IMPLANT
SUT VIC AB 2-0 CT1 27 (SUTURE) ×2
SUT VIC AB 2-0 CT1 TAPERPNT 27 (SUTURE) IMPLANT
SUT VIC AB 2-0 UR6 27 (SUTURE) ×3 IMPLANT
SWAB COLLECTION DEVICE MRSA (MISCELLANEOUS) ×1 IMPLANT
SWAB CULTURE ESWAB REG 1ML (MISCELLANEOUS) ×1 IMPLANT
SYR 10ML LL (SYRINGE) ×1 IMPLANT
SYR 20CC LL (SYRINGE) ×1 IMPLANT
SYR 50ML LL SCALE MARK (SYRINGE) ×2 IMPLANT
SYR BULB IRRIGATION 50ML (SYRINGE) ×1 IMPLANT
SYR CONTROL 10ML LL (SYRINGE) ×2 IMPLANT
TAPE CLOTH SURG 4X10 WHT LF (GAUZE/BANDAGES/DRESSINGS) ×1 IMPLANT
TOWEL OR 17X26 10 PK STRL BLUE (TOWEL DISPOSABLE) ×3 IMPLANT
WATER STERILE IRR 500ML POUR (IV SOLUTION) ×2 IMPLANT

## 2018-02-15 NOTE — Anesthesia Procedure Notes (Signed)
Procedure Name: LMA Insertion Date/Time: 02/15/2018 7:38 AM Performed by: Glory Buff, CRNA Pre-anesthesia Checklist: Patient identified, Emergency Drugs available, Suction available and Patient being monitored Patient Re-evaluated:Patient Re-evaluated prior to induction Oxygen Delivery Method: Circle system utilized Preoxygenation: Pre-oxygenation with 100% oxygen Induction Type: IV induction LMA: LMA with gastric port inserted LMA Size: 5.0 Number of attempts: 1 Placement Confirmation: positive ETCO2 Tube secured with: Tape Dental Injury: Teeth and Oropharynx as per pre-operative assessment

## 2018-02-15 NOTE — Anesthesia Postprocedure Evaluation (Signed)
Anesthesia Post Note  Patient: Jabier Wildes  Procedure(s) Performed: REMOVAL OF PENILE PROSTHESIS (N/A )     Patient location during evaluation: PACU Anesthesia Type: General Level of consciousness: awake and alert Pain management: pain level controlled Vital Signs Assessment: post-procedure vital signs reviewed and stable Respiratory status: spontaneous breathing, nonlabored ventilation and respiratory function stable Cardiovascular status: blood pressure returned to baseline and stable Postop Assessment: no apparent nausea or vomiting Anesthetic complications: no    Last Vitals:  Vitals:   02/15/18 1115 02/15/18 1145  BP: (!) 160/92 138/74  Pulse: (!) 44 (!) 50  Resp: 19   Temp: 37.1 C 36.4 C  SpO2: 97% 94%    Last Pain:  Vitals:   02/15/18 1115  TempSrc:   PainSc: 0-No pain                 Lynda Rainwater

## 2018-02-15 NOTE — Progress Notes (Signed)
Dsg fell off as pt stood to walk to recliner. Dr Karsten Ro paged.

## 2018-02-15 NOTE — Anesthesia Preprocedure Evaluation (Addendum)
Anesthesia Evaluation  Patient identified by MRN, date of birth, ID band Patient awake    Reviewed: Allergy & Precautions, H&P , NPO status , Patient's Chart, lab work & pertinent test results  Airway Mallampati: I  TM Distance: >3 FB Neck ROM: Full    Dental no notable dental hx. (+) Edentulous Upper, Edentulous Lower   Pulmonary neg pulmonary ROS, COPD, former smoker,    Pulmonary exam normal breath sounds clear to auscultation       Cardiovascular hypertension, Pt. on medications Normal cardiovascular exam+ dysrhythmias Atrial Fibrillation  Rhythm:Regular Rate:Normal     Neuro/Psych negative neurological ROS  negative psych ROS   GI/Hepatic negative GI ROS, (+)     substance abuse  alcohol use and marijuana use,   Endo/Other  diabetes  Renal/GU negative Renal ROS  negative genitourinary   Musculoskeletal negative musculoskeletal ROS (+)   Abdominal   Peds negative pediatric ROS (+)  Hematology negative hematology ROS (+)   Anesthesia Other Findings   Reproductive/Obstetrics negative OB ROS                             Anesthesia Physical  Anesthesia Plan  ASA: III  Anesthesia Plan: General   Post-op Pain Management:    Induction: Intravenous  PONV Risk Score and Plan: 2 and Ondansetron and Midazolam  Airway Management Planned: LMA  Additional Equipment:   Intra-op Plan:   Post-operative Plan: Extubation in OR  Informed Consent: I have reviewed the patients History and Physical, chart, labs and discussed the procedure including the risks, benefits and alternatives for the proposed anesthesia with the patient or authorized representative who has indicated his/her understanding and acceptance.   Dental advisory given  Plan Discussed with: CRNA  Anesthesia Plan Comments:         Anesthesia Quick Evaluation

## 2018-02-15 NOTE — Transfer of Care (Signed)
Immediate Anesthesia Transfer of Care Note  Patient: [name]  Procedure(s) Performed: REMOVAL OF PENILE PROSTHESIS (N/A )  Patient Location: PACU  Anesthesia Type:General  Level of Consciousness: awake, alert  and oriented  Airway & Oxygen Therapy: Patient Spontanous Breathing and Patient connected to face mask oxygen  Post-op Assessment: Report given to RN and Post -op Vital signs reviewed and stable  Post vital signs: Reviewed and stable  Last Vitals:  Vitals Value Taken Time  BP    Temp    Pulse 80 02/15/2018 10:25 AM  Resp 18 02/15/2018 10:26 AM  SpO2 100 % 02/15/2018 10:25 AM  Vitals shown include unvalidated device data.  Last Pain:  Vitals:   02/15/18 0559  TempSrc:   PainSc: 0-No pain      Patients Stated Pain Goal: 4 (99/67/22 [id number])  Complications: No apparent anesthesia complications

## 2018-02-16 ENCOUNTER — Encounter (HOSPITAL_COMMUNITY): Payer: Self-pay | Admitting: Urology

## 2018-02-17 LAB — AEROBIC CULTURE W GRAM STAIN (SUPERFICIAL SPECIMEN): Culture: NO GROWTH

## 2018-02-17 NOTE — Op Note (Signed)
PATIENT:  [name]  PRE-OPERATIVE DIAGNOSIS: Malfunctioning penile prosthesis  POST-OPERATIVE DIAGNOSIS:  1.  Malfunctioning penile prosthesis 2.  Probable infected penile prosthesis  PROCEDURE: Removal of multicomponent inflatable penile prosthesis  SURGEON:  Claybon Jabs  Resident Assistant surgeon: Case Clydene Laming, MD  INDICATION: [name] is a 72 year old male with an inflatable penile prosthesis that has malfunctioned.  He underwent a radical prostatectomy in 2007 and developed erectile dysfunction.  In 2012 I implanted a penile prosthesis and then had to revise this due to pump malfunction.  Over time the patient indicates the device has not been functioning properly and recently was seen and examined with no swelling or redness noted and no discharge or breakdown of the skin.  He did express experiencing some slight discomfort in the distal shaft of the penis beneath the glans.  He desires removal of the prosthesis.  He therefore is brought to the operating room today for removal of the device.  ANESTHESIA:  General  EBL:  Minimal  DRAINS: 1/4 inch Penrose drains placed in the distal and proximal corpus cavernosum as well as in the previous location of the reservoir.  LOCAL MEDICATIONS USED:  1/4 percent plain Marcaine used to infiltrate the subcutaneous tissue of the inguinal incision.  SPECIMEN:  Culture of drainage from scrotal skin breakdown for Gram stain and culture.  Description of procedure: After informed consent the patient was taken to the operating room and placed on the table in a supine position. General anesthesia was then administered. Once fully anesthetized the genitalia including the lower abdomen were sterilely prepped and draped. . An official timeout was then performed.  I first noted a dimple over the bulb of the pump in the scrotum and depressed the pump and noted purulent material expressed.  This was cultured.  I then made a midline  incision in the scrotum and then ellipsed around the chronic sinus tract and removed this.  The Bovie was then used to cut down onto the pump and as it was exposed further.  Material was encountered.  This was irrigated.  A 16 French Foley catheter was inserted, the bladder was drained and the catheter was plugged.  A ring retractor was then used in hopes were used to expose the incision.  The Bovie was used to cut down on the pump and allowed me to identify the tubing.  The tubing to the right and left corpus cavernosum was identified.  I noticed the pump was encased in a very thick, nonyielding rind of scar tissue.  Some of this was excised with the Bovie to allow better visualization of the tubing.  The Bovie was used to cut down on the tubing on its lateral aspect first on the right hand side until I encountered the corpus cavernosum where further infusion allowed exposure of the cylinder.  The cylinder was mobilized by placing a right angle clamp through the opening in the corpus cavernosum and beneath the cylinder and as it was withdrawn stay sutures were placed in the edges of the corpus cavernosum to allow for identification.  The cylinder on the right side was completely removed with the intact single rear tip extender.  I did not note any significant purulence in this location.  Attention was then directed to the left cavernosum and a cut down on the tubing in a similar fashion until the corpora cavernosa was opened and the cylinder was again identified.  Stay sutures were again placed in the edges of the corpus  cavernosum and the cylinder was removed along with the rear tip extender.  I noted no significant purulence surrounding the cylinder either.  I then turned my attention to the tubing leading to the reservoir.  I first divided the tubing at the pump in order to allow the reservoir to drain of all of the fluid.  I cut down on the tubing and with the use of retractors was able to cut down all  the way to the external inguinal ring where I was able to palpate a very hard material that at first I thought was the reservoir that had become adherent with scar tissue to the backside of the symphysis pubis however it turned out with further investigation both visually and digitally that this seemed to be a prominent osteophyte with the reservoir tubing passing lateral to this.  There was an extremely large amount of scar tissue present and I spent a great deal of time trying to mobilize the reservoir from its location.  I intermittently opened the Foley catheter and drained the bladder so that it was decompressed at all times.  I tried using Kelly clamps placed alongside the tubing to try to develop an opening into the retroperitoneal space where the reservoir was located but despite multiple attempts with multiple different techniques I was unable to free the reservoir from this scrotal incision and therefore felt it would be best to make a second inguinal incision to remove the reservoir.  Prior to proceeding with the inguinal incision for removal of the reservoir I proceeded to irrigate the corpus cavernosum.  I used an 62 French red rubber catheter and passed this down the left corpus cavernosum both distally and proximally and irrigated these with double antibiotic solution copiously.  I then switched to dilute Betadine solution and again irrigated the corpus proximally and distally in a identical fashion.  I then returned to the double antibiotic solution and irrigated the proximal and distal corpus cavernosum once again with this solution.  This same procedure was then undertaken on the right-hand side.  Having irrigated these areas thoroughly and also having noted no significant purulent material I still felt it was prudent to place drains and therefore chose a quarter inch Penrose drain and passed this using the DeBakey forceps through the corporotomy and into the distal corpus cavernosum on both the  right and left sides and then into the proximal corpus cavernosum on each side as well.  The Penrose drains were all then secured with a single sterile safety pin.  I then copiously irrigated the scrotum with antibiotic solution.  I elected to close the superior portion of the scrotal incision with running, 3-0 chromic suture down to the inferior aspect of the incision allowing approximately a centimeter of the incision remained open where the Penrose drains then exited.  I secured the sterile safety pin to the skin by placing a 3-0 nylon suture through the scrotal skin and then tying this to the safety pin.  Attention was then directed to the right inguinal region.  Transverse inguinal incision was made following lines of Langer and carried down through the subcutaneous tissue to the external oblique fascia.  This was cleared of adipose tissue and the external inguinal ring was identified.  I identified the tubing of the reservoir and this was secured with a Kelly clamp.  The external oblique fascia was then incised slightly to open the area where the reservoir was located.  It was very adherent with scar but the  Bovie was used to incise the scar and I was able to free the reservoir and remove it.  I noted no significant purulent material in this location.  I did however irrigate the location where the reservoir had been by placing the 18 French red rubber catheter into this location and irrigating with double antibiotic solution.  I then placed a Penrose drain into the deepest portion of the location where the reservoir had been and brought this out medially toward the external inguinal ring.  The external oblique fascia was then reapproximated with running 2-0 PDS suture.  I then irrigated the subcutaneous tissue and reapproximated the subcutaneous tissue with 2-0 Vicryl.  Marcaine local anesthetic was injected in the subcutaneous tissue and dermal region.  The skin was then closed from its lateral aspect  medially with a running 4-0 subcuticular Monocryl suture leaving the medial aspect of the wound open and allowing the Penrose drain to exit from this location.  It was secured to the skin with a nylon and safety pin in an identical fashion as described above.  Sterile 4 x 4's and an occlusive dressing were applied to the inguinal incision.  Sterile 4 x 4's were then applied to the scrotal incision and a Curlex was used to wrap the scrotum and penis in a "mummy wrap" fashion.  The bladder was drained and the Foley catheter was removed.  Needle, sponge and instrument counts were reportedly correct 2 at the end the operation.  The patient was awakened and taken to the recovery room in stable and satisfactory condition.  He tolerated the procedure well with no intraoperative complications.  He'll be maintained on Augmentin postoperatively with instructions on advancement of his Penrose drains and follow-up in my office next week.  PLAN OF CARE: Discharge to home after PACU  PATIENT DISPOSITION:  PACU - hemodynamically stable.

## 2018-02-24 ENCOUNTER — Other Ambulatory Visit: Payer: Self-pay

## 2018-02-24 ENCOUNTER — Encounter (HOSPITAL_COMMUNITY): Payer: Self-pay | Admitting: Emergency Medicine

## 2018-02-24 DIAGNOSIS — T8131XD Disruption of external operation (surgical) wound, not elsewhere classified, subsequent encounter: Secondary | ICD-10-CM | POA: Insufficient documentation

## 2018-02-24 DIAGNOSIS — J449 Chronic obstructive pulmonary disease, unspecified: Secondary | ICD-10-CM | POA: Diagnosis not present

## 2018-02-24 DIAGNOSIS — Z7984 Long term (current) use of oral hypoglycemic drugs: Secondary | ICD-10-CM | POA: Insufficient documentation

## 2018-02-24 DIAGNOSIS — I110 Hypertensive heart disease with heart failure: Secondary | ICD-10-CM | POA: Diagnosis not present

## 2018-02-24 DIAGNOSIS — Z87891 Personal history of nicotine dependence: Secondary | ICD-10-CM | POA: Diagnosis not present

## 2018-02-24 DIAGNOSIS — M545 Low back pain: Secondary | ICD-10-CM | POA: Insufficient documentation

## 2018-02-24 DIAGNOSIS — Z7901 Long term (current) use of anticoagulants: Secondary | ICD-10-CM | POA: Diagnosis not present

## 2018-02-24 DIAGNOSIS — Z6836 Body mass index (BMI) 36.0-36.9, adult: Secondary | ICD-10-CM | POA: Insufficient documentation

## 2018-02-24 DIAGNOSIS — Y838 Other surgical procedures as the cause of abnormal reaction of the patient, or of later complication, without mention of misadventure at the time of the procedure: Secondary | ICD-10-CM | POA: Insufficient documentation

## 2018-02-24 DIAGNOSIS — Y9301 Activity, walking, marching and hiking: Secondary | ICD-10-CM | POA: Diagnosis not present

## 2018-02-24 DIAGNOSIS — G8929 Other chronic pain: Secondary | ICD-10-CM | POA: Insufficient documentation

## 2018-02-24 DIAGNOSIS — D649 Anemia, unspecified: Secondary | ICD-10-CM | POA: Insufficient documentation

## 2018-02-24 DIAGNOSIS — I872 Venous insufficiency (chronic) (peripheral): Secondary | ICD-10-CM | POA: Diagnosis not present

## 2018-02-24 DIAGNOSIS — Z7982 Long term (current) use of aspirin: Secondary | ICD-10-CM | POA: Insufficient documentation

## 2018-02-24 DIAGNOSIS — N400 Enlarged prostate without lower urinary tract symptoms: Secondary | ICD-10-CM | POA: Insufficient documentation

## 2018-02-24 DIAGNOSIS — G250 Essential tremor: Secondary | ICD-10-CM | POA: Diagnosis not present

## 2018-02-24 DIAGNOSIS — W228XXA Striking against or struck by other objects, initial encounter: Secondary | ICD-10-CM | POA: Insufficient documentation

## 2018-02-24 DIAGNOSIS — E669 Obesity, unspecified: Secondary | ICD-10-CM | POA: Diagnosis not present

## 2018-02-24 DIAGNOSIS — Z8673 Personal history of transient ischemic attack (TIA), and cerebral infarction without residual deficits: Secondary | ICD-10-CM | POA: Diagnosis not present

## 2018-02-24 DIAGNOSIS — M1990 Unspecified osteoarthritis, unspecified site: Secondary | ICD-10-CM | POA: Insufficient documentation

## 2018-02-24 DIAGNOSIS — I480 Paroxysmal atrial fibrillation: Secondary | ICD-10-CM | POA: Diagnosis not present

## 2018-02-24 DIAGNOSIS — I509 Heart failure, unspecified: Secondary | ICD-10-CM | POA: Diagnosis not present

## 2018-02-24 DIAGNOSIS — E1140 Type 2 diabetes mellitus with diabetic neuropathy, unspecified: Secondary | ICD-10-CM | POA: Diagnosis not present

## 2018-02-24 DIAGNOSIS — I43 Cardiomyopathy in diseases classified elsewhere: Secondary | ICD-10-CM | POA: Diagnosis not present

## 2018-02-24 DIAGNOSIS — M7989 Other specified soft tissue disorders: Secondary | ICD-10-CM | POA: Diagnosis present

## 2018-02-24 DIAGNOSIS — I5033 Acute on chronic diastolic (congestive) heart failure: Secondary | ICD-10-CM | POA: Diagnosis not present

## 2018-02-24 DIAGNOSIS — E785 Hyperlipidemia, unspecified: Secondary | ICD-10-CM | POA: Insufficient documentation

## 2018-02-24 DIAGNOSIS — F1011 Alcohol abuse, in remission: Secondary | ICD-10-CM | POA: Diagnosis not present

## 2018-02-24 DIAGNOSIS — R2241 Localized swelling, mass and lump, right lower limb: Secondary | ICD-10-CM | POA: Diagnosis not present

## 2018-02-24 DIAGNOSIS — R0602 Shortness of breath: Secondary | ICD-10-CM | POA: Diagnosis not present

## 2018-02-24 DIAGNOSIS — S81801A Unspecified open wound, right lower leg, initial encounter: Secondary | ICD-10-CM | POA: Insufficient documentation

## 2018-02-24 NOTE — ED Triage Notes (Signed)
Pt from home with c/o right leg edema with mild drainage and ulcerations. Pt's daughter stated she did not notice areas yesterday. Pt states he also hit bed with his ankle and there is now a seeping wound on that spot. Drainage and bleeding is controlled.

## 2018-02-25 ENCOUNTER — Observation Stay (HOSPITAL_BASED_OUTPATIENT_CLINIC_OR_DEPARTMENT_OTHER): Payer: Medicare Other

## 2018-02-25 ENCOUNTER — Ambulatory Visit: Payer: Self-pay | Admitting: *Deleted

## 2018-02-25 ENCOUNTER — Observation Stay (HOSPITAL_COMMUNITY)
Admission: EM | Admit: 2018-02-25 | Discharge: 2018-02-25 | Disposition: A | Discharge status: Home / Self Care | Payer: Medicare Other | Attending: Internal Medicine | Admitting: Internal Medicine

## 2018-02-25 ENCOUNTER — Other Ambulatory Visit: Payer: Self-pay

## 2018-02-25 ENCOUNTER — Emergency Department (HOSPITAL_COMMUNITY): Payer: Medicare Other

## 2018-02-25 DIAGNOSIS — D62 Acute posthemorrhagic anemia: Secondary | ICD-10-CM | POA: Diagnosis present

## 2018-02-25 DIAGNOSIS — L0390 Cellulitis, unspecified: Secondary | ICD-10-CM

## 2018-02-25 DIAGNOSIS — I4821 Permanent atrial fibrillation: Secondary | ICD-10-CM | POA: Diagnosis present

## 2018-02-25 DIAGNOSIS — M7989 Other specified soft tissue disorders: Secondary | ICD-10-CM | POA: Diagnosis not present

## 2018-02-25 DIAGNOSIS — I5033 Acute on chronic diastolic (congestive) heart failure: Secondary | ICD-10-CM | POA: Diagnosis not present

## 2018-02-25 DIAGNOSIS — R0602 Shortness of breath: Secondary | ICD-10-CM | POA: Diagnosis not present

## 2018-02-25 DIAGNOSIS — I110 Hypertensive heart disease with heart failure: Secondary | ICD-10-CM | POA: Diagnosis not present

## 2018-02-25 DIAGNOSIS — I480 Paroxysmal atrial fibrillation: Secondary | ICD-10-CM

## 2018-02-25 DIAGNOSIS — I5032 Chronic diastolic (congestive) heart failure: Secondary | ICD-10-CM | POA: Diagnosis present

## 2018-02-25 DIAGNOSIS — I509 Heart failure, unspecified: Secondary | ICD-10-CM

## 2018-02-25 DIAGNOSIS — D649 Anemia, unspecified: Secondary | ICD-10-CM

## 2018-02-25 DIAGNOSIS — I340 Nonrheumatic mitral (valve) insufficiency: Secondary | ICD-10-CM | POA: Diagnosis not present

## 2018-02-25 DIAGNOSIS — E119 Type 2 diabetes mellitus without complications: Secondary | ICD-10-CM | POA: Diagnosis not present

## 2018-02-25 LAB — COMPREHENSIVE METABOLIC PANEL
ALT: 16 U/L (ref 0–44)
AST: 22 U/L (ref 15–41)
Albumin: 2.9 g/dL — ABNORMAL LOW (ref 3.5–5.0)
Alkaline Phosphatase: 78 U/L (ref 38–126)
Anion gap: 8 (ref 5–15)
BUN: 15 mg/dL (ref 8–23)
CO2: 24 mmol/L (ref 22–32)
Calcium: 8.7 mg/dL — ABNORMAL LOW (ref 8.9–10.3)
Chloride: 106 mmol/L (ref 98–111)
Creatinine, Ser: 1.11 mg/dL (ref 0.61–1.24)
GFR calc Af Amer: >60 mL/min (ref >60)
GFR calc non Af Amer: >60 mL/min (ref >60)
Glucose, Bld: 104 mg/dL — ABNORMAL HIGH (ref 70–99)
Potassium: 4.1 mmol/L (ref 3.5–5.1)
Sodium: 138 mmol/L (ref 135–145)
Total Bilirubin: 0.4 mg/dL (ref 0.3–1.2)
Total Protein: 7.5 g/dL (ref 6.5–8.1)

## 2018-02-25 LAB — D-DIMER, QUANTITATIVE: D-Dimer, Quant: 1.4 ug/mL-FEU — ABNORMAL HIGH (ref 0.00–0.50)

## 2018-02-25 LAB — CBC WITH DIFFERENTIAL/PLATELET
Basophils Absolute: 0 10*3/uL (ref 0.0–0.1)
Basophils Relative: 0 %
Eosinophils Absolute: 0.1 10*3/uL (ref 0.0–0.7)
Eosinophils Relative: 1 %
HCT: 28.9 % — ABNORMAL LOW (ref 39.0–52.0)
Hemoglobin: 9.7 g/dL — ABNORMAL LOW (ref 13.0–17.0)
Lymphocytes Relative: 17 %
Lymphs Abs: 1.3 10*3/uL (ref 0.7–4.0)
MCH: 29.2 pg (ref 26.0–34.0)
MCHC: 33.6 g/dL (ref 30.0–36.0)
MCV: 87 fL (ref 78.0–100.0)
Monocytes Absolute: 0.7 10*3/uL (ref 0.1–1.0)
Monocytes Relative: 9 %
Neutro Abs: 5.7 10*3/uL (ref 1.7–7.7)
Neutrophils Relative %: 73 %
Platelets: 359 10*3/uL (ref 150–400)
RBC: 3.32 MIL/uL — ABNORMAL LOW (ref 4.22–5.81)
RDW: 13.9 % (ref 11.5–15.5)
WBC: 7.7 10*3/uL (ref 4.0–10.5)

## 2018-02-25 LAB — POC OCCULT BLOOD, ED: Fecal Occult Bld: NEGATIVE

## 2018-02-25 LAB — MRSA PCR SCREENING: MRSA by PCR: NEGATIVE

## 2018-02-25 LAB — ECHOCARDIOGRAM COMPLETE

## 2018-02-25 LAB — TROPONIN I: Troponin I: 0.03 ng/mL (ref <0.03)

## 2018-02-25 LAB — BRAIN NATRIURETIC PEPTIDE: B Natriuretic Peptide: 680.9 pg/mL — ABNORMAL HIGH (ref 0.0–100.0)

## 2018-02-25 MED ORDER — SODIUM CHLORIDE 0.9% FLUSH: 3.0000 mL | INTRAVENOUS | Status: DC | PRN

## 2018-02-25 MED ORDER — FUROSEMIDE 10 MG/ML IJ SOLN
40.0000 mg | Freq: Every day | INTRAMUSCULAR | Status: DC
  Administered 2018-02-25: 40 mg via INTRAVENOUS
  Filled 2018-02-25: qty 4

## 2018-02-25 MED ORDER — ONDANSETRON HCL 4 MG/2ML IJ SOLN: 4.0000 mg | Freq: Four times a day (QID) | INTRAMUSCULAR | Status: DC | PRN

## 2018-02-25 MED ORDER — BENAZEPRIL HCL 20 MG PO TABS
40.0000 mg | ORAL_TABLET | Freq: Every day | ORAL | Status: DC
  Administered 2018-02-25: 40 mg via ORAL
  Filled 2018-02-25: qty 2

## 2018-02-25 MED ORDER — AMLODIPINE BESYLATE 5 MG PO TABS
10.0000 mg | ORAL_TABLET | Freq: Every day | ORAL | Status: DC
  Administered 2018-02-25: 10 mg via ORAL
  Filled 2018-02-25: qty 2

## 2018-02-25 MED ORDER — METOPROLOL SUCCINATE ER 100 MG PO TB24
100.0000 mg | ORAL_TABLET | Freq: Every day | ORAL | Status: DC
  Administered 2018-02-25: 100 mg via ORAL
  Filled 2018-02-25: qty 1

## 2018-02-25 MED ORDER — RIVAROXABAN 20 MG PO TABS: 20.0000 mg | ORAL_TABLET | Freq: Every day | ORAL | Status: DC

## 2018-02-25 MED ORDER — SPIRONOLACTONE 25 MG PO TABS
25.0000 mg | ORAL_TABLET | Freq: Every day | ORAL | Status: DC
  Administered 2018-02-25: 25 mg via ORAL
  Filled 2018-02-25: qty 1

## 2018-02-25 MED ORDER — PRAVASTATIN SODIUM 20 MG PO TABS: 40.0000 mg | ORAL_TABLET | Freq: Every day | ORAL | Status: DC

## 2018-02-25 MED ORDER — ALBUTEROL SULFATE (2.5 MG/3ML) 0.083% IN NEBU: 2.5000 mg | INHALATION_SOLUTION | Freq: Four times a day (QID) | RESPIRATORY_TRACT | Status: DC | PRN

## 2018-02-25 MED ORDER — SODIUM CHLORIDE 0.9% FLUSH
3.0000 mL | Freq: Two times a day (BID) | INTRAVENOUS | Status: DC
  Administered 2018-02-25: 3 mL via INTRAVENOUS

## 2018-02-25 MED ORDER — ACETAMINOPHEN 325 MG PO TABS
650.0000 mg | ORAL_TABLET | ORAL | Status: DC | PRN
  Administered 2018-02-25: 650 mg via ORAL
  Filled 2018-02-25: qty 2

## 2018-02-25 MED ORDER — IBUPROFEN 200 MG PO TABS: 400.0000 mg | ORAL_TABLET | Freq: Four times a day (QID) | ORAL | Status: DC | PRN

## 2018-02-25 MED ORDER — PANTOPRAZOLE SODIUM 40 MG PO TBEC
40.0000 mg | DELAYED_RELEASE_TABLET | Freq: Every day | ORAL | Status: DC
  Administered 2018-02-25: 40 mg via ORAL
  Filled 2018-02-25: qty 1

## 2018-02-25 MED ORDER — SODIUM CHLORIDE 0.9 % IV SOLN: 250.0000 mL | INTRAVENOUS | Status: DC | PRN

## 2018-02-25 MED ORDER — AMOXICILLIN-POT CLAVULANATE 875-125 MG PO TABS
1.0000 | ORAL_TABLET | Freq: Two times a day (BID) | ORAL | Status: DC
  Administered 2018-02-25: 1 via ORAL
  Filled 2018-02-25: qty 1

## 2018-02-25 MED ORDER — FUROSEMIDE 10 MG/ML IJ SOLN
60.0000 mg | Freq: Once | INTRAMUSCULAR | Status: AC
  Administered 2018-02-25: 60 mg via INTRAVENOUS
  Filled 2018-02-25: qty 6

## 2018-02-25 MED ORDER — HYDROCODONE-ACETAMINOPHEN 5-325 MG PO TABS
1.0000 | ORAL_TABLET | ORAL | Status: DC | PRN
Start: 2018-02-25 — End: 2018-02-25

## 2018-02-25 MED ORDER — METFORMIN HCL ER 500 MG PO TB24
500.0000 mg | ORAL_TABLET | Freq: Every day | ORAL | Status: DC
  Administered 2018-02-25: 500 mg via ORAL
  Filled 2018-02-25: qty 1

## 2018-02-25 NOTE — Progress Notes (Signed)
  Echocardiogram 2D Echocardiogram has been performed.  [name] 02/25/2018, 2:00 PM

## 2018-02-25 NOTE — ED Provider Notes (Signed)
[facility] DEPT Provider Note   CSN: [id number] Arrival date & time: 02/24/18  2324  Time seen 02:15 AM   History   Chief Complaint Chief Complaint  Patient presents with  . Leg Swelling    right leg    HPI [name] is a 72 y.o. male.  HPI patient initially told me he had been having swelling of his legs for a few weeks but actually it has been a few years looking at his cardiologist note.  He did not tell me this initially but after I looked at his leg he states it is always the right leg is more swollen.  He states he has had a Doppler ultrasound however when I look at the records that was done a couple of years ago.  He states he is on Xarelto because he has atrial fib.  He states he takes water pills as needed.  Tonight patient was walking around his bed and hit his leg on the edge of the bed frame.  It has been bleeding and oozing.  Daughter also is concerned because he now has some blisters on his lower leg that he has not had before.  He states he is never had any open wounds on his legs before.  He denies fever or chills, he also denies pain in his leg.  He denies any chest pain, but states he has shortness of breath and dyspnea on exertion.  His last chest x-ray was over 2 years ago.  He had a prosthetic implant removed from his penis on July 29.  He had a follow-up yesterday.  He states although he still has swelling and drainage it is improving.  He was told everything looked fine yesterday.  He has been on Augmentin twice a day and is to take it for 2 weeks after his surgery.  He states he was diagnosed with diabetes about 3 weeks ago and his blood sugars have been under 150.  He has a follow-up appointment with his PCP on August 12, and his urologist the following day.  He also is followed by cardiology every 6 months.  PCP Biagio Borg, MD  Urologist Dr. Karsten Ro Cardiology Dr. Ellyn Hack  Past Medical History:  Diagnosis Date  . Adenoma  05/2008  . Alcoholism in recovery ([place])   . BPH (benign prostatic hyperplasia)   . Cancer [facility])    Prostate  . Chronic LBP    Hip & Back -- Sees Dr. Maia Petties @ [place]  . Colon polyps   . Complex renal cyst 12/2010  . Controlled type 2 diabetes mellitus with neuropathy ([place]) 01/2007  . COPD (chronic obstructive pulmonary disease) ([place])   . Diabetes ([place])   . Diverticulitis of colon   . ED (erectile dysfunction)    s/p Penile prosthesis (09/2010)  . History of prostate cancer    Dr. Karsten Ro  . History of sick sinus syndrome    reduced BB dose for Bradycardia  . HLD (hyperlipidemia)   . Hypertension   . Impaired glucose tolerance 12/21/2013  . Nephrolithiasis   . Osteoarthritis of both hips   . Osteoarthritis of lumbosacral spine    with Disc Disease  . PAF (paroxysmal atrial fibrillation) ([facility])    No longer on Amiodarone.  Not on Anticoaguation b/c no recurrence.  . Paresthesias/numbness    Bilateral LE  . Peripheral neuropathy 12/21/2013    Patient Active Problem List   Diagnosis Date Noted  . Occipital  stroke ([place]), left 09/23/2017  . Diabetes ([place])   . Encounter for well adult exam with abnormal findings 07/31/2017  . Medication management 05/22/2016  . Swelling of lower extremity 04/04/2016  . Junctional tachycardia ([place]) 03/18/2015  . Paresthesia of both feet 07/05/2014  . Exertional dyspnea 07/05/2014  . Obesity (BMI 30-39.9) 05/04/2014  . Benign essential tremor 05/04/2014  . Impaired glucose tolerance 12/21/2013  . Peripheral neuropathy 12/21/2013  . Tremor 10/20/2012  . Nonspecific abnormal finding in stool contents 09/06/2012  . Heme positive stool 08/09/2012  . Difficulty passing stool 08/09/2012  . Sebaceous cyst 04/15/2012  . Complex renal cyst 01/13/2011  . Back pain 01/02/2011  . PAF (paroxysmal atrial fibrillation) - now off amiodarone; asymptomatic. CHA2DS2-VASc Score -3 (on Xarelto) 01/02/2011  . Preventative health care 12/31/2010  .  KNEE PAIN, LEFT 08/27/2009  . COLONIC POLYPS, HX OF 08/08/2008  . ERECTILE DYSFUNCTION 09/27/2007  . ULNAR NEUROPATHY 09/27/2007  . VENTRICULAR HYPERTROPHY, LEFT 09/27/2007  . ALLERGIC RHINITIS 09/27/2007  . ARTHRITIS 09/27/2007  . GANGLION CYST, WRIST, LEFT 09/27/2007  . HEMORRHOIDS, INTERNAL 06/10/2007  . DIVERTICULOSIS, COLON 06/10/2007  . [place] DISEASE, LUMBAR 06/03/2007  . NEPHROLITHIASIS, HX OF 06/03/2007  . Hyperlipidemia with target LDL less than 100 02/11/2007  . ANXIETY 02/11/2007  . Nondependent Alcohol Abuse, in Remission 02/11/2007  . Hypertensive heart disease with chronic diastolic congestive heart failure ([place]) 02/11/2007  . COPD (chronic obstructive pulmonary disease) ([place]) 02/11/2007  . BENIGN PROSTATIC HYPERTROPHY 02/11/2007  . LOW BACK PAIN 02/11/2007  . Disturbance of skin sensation 02/11/2007  . PROSTATE CANCER, HX OF 02/11/2007    Past Surgical History:  Procedure Laterality Date  . CARDIAC CATHETERIZATION  2003   Normal Coronary Arteries.  Marland Kitchen NM MYOVIEW LTD  03/2016   No Ischemia or Infarct - Visual EF ~60% (computer EF ~39%) - by Echo 55-60%  . PENILE PROSTHESIS IMPLANT  06/21/2012   Procedure: PENILE PROTHESIS INFLATABLE;  Surgeon: Claybon Jabs, MD;  Location: [facility];  Service: Urology;  Laterality: N/A;  REMOVAL AND REPLACEMENT OF SOME  OF PROSTHESIS ([facility])   . PENILE PROSTHESIS PLACEMENT  09/2010  . PROSTATECTOMY    . REMOVAL OF PENILE PROSTHESIS N/A 02/15/2018   Procedure: REMOVAL OF PENILE PROSTHESIS;  Surgeon: Kathie Rhodes, MD;  Location: [facility];  Service: Urology;  Laterality: N/A;  . TRANSTHORACIC ECHOCARDIOGRAM  04/2016   Relatively normal EF of 55-60%. Normal wall motion, suggesting no prior MI. There is grade 2 diastolic dysfunction (pseudo-normal filling pattern) along with moderately dilated left atrium.        Home Medications    Prior to Admission medications   Medication Sig Start Date End Date Taking? Authorizing  Provider  Albuterol Sulfate 108 (90 Base) MCG/ACT AEPB Inhale 2 puffs into the lungs every 6 (six) hours as needed. Patient taking differently: Inhale 2 puffs into the lungs every 6 (six) hours as needed (shortness of breath).  12/28/17  Yes Biagio Borg, MD  amLODipine (NORVASC) 10 MG tablet TAKE 1 TABLET BY MOUTH  DAILY 01/04/18  Yes Leonie Man, MD  amoxicillin-clavulanate (AUGMENTIN) 875-125 MG tablet Take 1 tablet by mouth 2 (two) times daily for 14 days. 02/15/18 03/01/18 Yes Wood, Case M, MD  aspirin 81 MG EC tablet Take 81 mg by mouth daily.     Yes [provider]  benazepril (LOTENSIN) 40 MG tablet TAKE 1 TABLET BY MOUTH  DAILY 12/15/17  Yes Leonie Man, MD  furosemide (  LASIX) 40 MG tablet TAKE 1 TO 2 TABLETS DAILY AS NEEDED FOR WEIGHT GAIN FOR 3 LBS OR MORE. Patient taking differently: Take 20-80 mg by mouth daily as needed for edema.  10/28/17  Yes Leonie Man, MD  HYDROcodone-acetaminophen [facility]) 5-325 MG tablet Take 1-2 tablets by mouth every 4 (four) hours as needed for moderate pain. 02/15/18  Yes Wood, Case M, MD  ibuprofen (ADVIL,MOTRIN) 200 MG tablet Take 400-800 mg by mouth every 6 (six) hours as needed for moderate pain.    Yes [provider]  loratadine (CLARITIN) 10 MG tablet Take 10 mg by mouth daily as needed for allergies.   Yes [provider]  metFORMIN (GLUCOPHAGE-XR) 500 MG 24 hr tablet Take 1 tablet (500 mg total) by mouth daily with breakfast. 07/29/17  Yes Biagio Borg, MD  metoprolol succinate (TOPROL-XL) 100 MG 24 hr tablet Take 1 tablet (100 mg total) by mouth daily. 10/26/17  Yes [place], Peter M, MD  omeprazole (PRILOSEC) 20 MG capsule Take 1 capsule (20 mg total) by mouth daily. 10/26/17  Yes Leonie Man, MD  potassium chloride SA (KLOR-CON M20) 20 MEQ tablet Take 1 to 2 tablets as needed for weight gain  Of greater than 3 lbs.or more Patient taking differently: Take 20 mEq by mouth See admin instructions. Take 20 meq by  mouth daily when taking 80 mg of furosemide.  Take 20 meq every other day if taking 20 or 40 mg of furosemide daily 10/28/17  Yes Leonie Man, MD  pravastatin (PRAVACHOL) 40 MG tablet Take 1 tablet (40 mg total) by mouth daily. 07/03/17  Yes Leonie Man, MD  spironolactone (ALDACTONE) 25 MG tablet TAKE 1 TABLET BY MOUTH  DAILY 01/04/18  Yes Leonie Man, MD  XARELTO 20 MG TABS tablet TAKE 1 TABLET BY MOUTH  DAILY WITH SUPPER 08/03/17  Yes Leonie Man, MD  Blood Glucose Monitoring Suppl (ONE TOUCH ULTRA 2) w/Device KIT Use as directed E11.9 09/01/17   Biagio Borg, MD  glucose blood (ONE TOUCH ULTRA TEST) test strip Use as instructed once per day E11.9 09/02/17   Biagio Borg, MD  [facility] DELICA LANCETS 56D MISC USE 1 TIME PER DAY 10/20/17   Biagio Borg, MD    Family History Family History  Problem Relation Age of Onset  . Coronary artery disease Mother   . Heart attack Mother   . Coronary artery disease Father   . Prostate cancer Father   . Diabetes Father     Social History Social History   Tobacco Use  . Smoking status: Former Smoker    Types: Cigarettes    Last attempt to quit: 11/04/1982    Years since quitting: 35.3  . Smokeless tobacco: Never Used  Substance Use Topics  . Alcohol use: Yes    Comment: no alcohol x 1 week wants to stop  . Drug use: No    Types: Marijuana    Comment: use to smoke marijuana  Lives at home   Allergies   Ciprofloxacin   Review of Systems Review of Systems  All other systems reviewed and are negative.    Physical Exam Updated Vital Signs BP (!) 143/71   Pulse 73   Temp 98.1 F (36.7 C) (Oral)   Resp 13   SpO2 100%   Vital signs normal    Physical Exam  Constitutional: He is oriented to person, place, and time. He appears well-developed and well-nourished.  Non-toxic  appearance. He does not appear ill. No distress.  HENT:  Head: Normocephalic and atraumatic.  Right Ear: External ear normal.  Left Ear:  External ear normal.  Nose: Nose normal. No mucosal edema or rhinorrhea.  Mouth/Throat: Oropharynx is clear and moist and mucous membranes are normal. No dental abscesses or uvula swelling.  Eyes: Pupils are equal, round, and reactive to light. Conjunctivae and EOM are normal.  Neck: Normal range of motion and full passive range of motion without pain. Neck supple.  Cardiovascular: Normal rate, regular rhythm and normal heart sounds. Exam reveals no gallop and no friction rub.  No murmur heard. Pulmonary/Chest: Effort normal and breath sounds normal. No respiratory distress. He has no wheezes. He has no rhonchi. He has no rales. He exhibits no tenderness and no crepitus.  Abdominal: Soft. Normal appearance and bowel sounds are normal. He exhibits no distension. There is no tenderness. There is no rebound and no guarding.  Musculoskeletal: Normal range of motion. He exhibits edema. He exhibits no tenderness.  Moves all extremities well.  Patient has no swelling of his left lower leg.  He has some diffuse swelling of his right lower leg.  He is noted to have some firmness to palpation of the medial thigh without warmth.  He has some chronic changes of his toenails and his foot with hyperpigmentation of the skin and thickening of the skin.  He is noted to have an abraded area on the lateral aspect of his lower leg just proximal to the lateral malleolus, there is no active bleeding.  He does have some mild increased redness and warmth.  There are a few small clear fluid-filled blisters seen on the lower extremity.  Neurological: He is alert and oriented to person, place, and time. He has normal strength. No cranial nerve deficit.  Skin: Skin is warm, dry and intact. No rash noted. No erythema. No pallor.  Psychiatric: He has a normal mood and affect. His speech is normal and behavior is normal. His mood appears not anxious.  Nursing note and vitals reviewed.         ED Treatments / Results   Labs (all labs ordered are listed, but only abnormal results are displayed) Results for orders placed or performed during the hospital encounter of 02/25/18  CBC with Differential  Result Value Ref Range   WBC 7.7 4.0 - 10.5 K/uL   RBC 3.32 (L) 4.22 - 5.81 MIL/uL   Hemoglobin 9.7 (L) 13.0 - 17.0 g/dL   HCT 28.9 (L) 39.0 - 52.0 %   MCV 87.0 78.0 - 100.0 fL   MCH 29.2 26.0 - 34.0 pg   MCHC 33.6 30.0 - 36.0 g/dL   RDW 13.9 11.5 - 15.5 %   Platelets 359 150 - 400 K/uL   Neutrophils Relative % 73 %   Neutro Abs 5.7 1.7 - 7.7 K/uL   Lymphocytes Relative 17 %   Lymphs Abs 1.3 0.7 - 4.0 K/uL   Monocytes Relative 9 %   Monocytes Absolute 0.7 0.1 - 1.0 K/uL   Eosinophils Relative 1 %   Eosinophils Absolute 0.1 0.0 - 0.7 K/uL   Basophils Relative 0 %   Basophils Absolute 0.0 0.0 - 0.1 K/uL  Comprehensive metabolic panel  Result Value Ref Range   Sodium 138 135 - 145 mmol/L   Potassium 4.1 3.5 - 5.1 mmol/L   Chloride 106 98 - 111 mmol/L   CO2 24 22 - 32 mmol/L   Glucose, Bld 104 (H) 70 -  99 mg/dL   BUN 15 8 - 23 mg/dL   Creatinine, Ser 1.11 0.61 - 1.24 mg/dL   Calcium 8.7 (L) 8.9 - 10.3 mg/dL   Total Protein 7.5 6.5 - 8.1 g/dL   Albumin 2.9 (L) 3.5 - 5.0 g/dL   AST 22 15 - 41 U/L   ALT 16 0 - 44 U/L   Alkaline Phosphatase 78 38 - 126 U/L   Total Bilirubin 0.4 0.3 - 1.2 mg/dL   GFR calc non Af Amer >60 >60 mL/min   GFR calc Af Amer >60 >60 mL/min   Anion gap 8 5 - 15  D-dimer, quantitative  Result Value Ref Range   D-Dimer, Quant 1.40 (H) 0.00 - 0.50 ug/mL-FEU  Brain natriuretic peptide  Result Value Ref Range   B Natriuretic Peptide 680.9 (H) 0.0 - 100.0 pg/mL  Troponin I  Result Value Ref Range   Troponin I 0.03 (HH) <0.03 ng/mL  POC occult blood, ED RN will collect  Result Value Ref Range   Fecal Occult Bld NEGATIVE NEGATIVE   Laboratory interpretation all normal except new anemia since July 24, elevated d-dimer,borderline troponin, elevated BNP    EKG EKG  Interpretation  Date/Time:  Thursday February 25 2018 02:50:13 EDT Ventricular Rate:  79 PR Interval:    QRS Duration: 106 QT Interval:  434 QTC Calculation: 498 R Axis:   4 Text Interpretation:  Atrial fibrillation Abnormal T, consider ischemia, lateral leads Minimal ST elevation, lateral leads Since last tracing 21 Jun 2012 Atrial fibrillation has replaced Sinus bradycardia Since last tracing Confirmed by Rolland Porter [phone]) on 02/25/2018 3:58:40 AM   Radiology Dg Chest 2 View  Result Date: 02/25/2018 CLINICAL DATA:  Acute onset of shortness of breath and leg swelling. EXAM: CHEST - 2 VIEW COMPARISON:  Chest radiograph performed 09/08/2014 FINDINGS: The lungs are well-aerated. Vascular congestion is noted. Mildly increased interstitial markings could reflect minimal interstitial edema. There is no evidence of pleural effusion or pneumothorax. The heart is borderline normal in size. No acute osseous abnormalities are seen. IMPRESSION: Vascular congestion noted. Mildly increased interstitial markings could reflect minimal interstitial edema. Electronically Signed   By: Garald Balding M.D.   On: 02/25/2018 03:03    Procedures Procedures (including critical care time)  Medications Ordered in ED    Initial Impression / Assessment and Plan / ED Course  I have reviewed the triage vital signs and the nursing notes.  Pertinent labs & imaging results that were available during my care of the patient were reviewed by me and considered in my medical decision making (see chart for details).   Laboratory testing was done including chest x-ray for his complaints of shortness of breath with exertion.   Patient noted to have developed a anemia since his blood work about 2 weeks ago prior to his surgery.  He also has increased pulmonary vascularity on a chest x-ray, he has not had an x-ray done in several years.    5:20 a.m. talked to patient and his daughter about his test results.  He is agreeable to  admission.  Patient states the drainage from his surgery has been bloody.   05:23 AM Dr Alcario Drought, hospitalist, will admit.   Final Clinical Impressions(s) / ED Diagnoses   Final diagnoses:  Right leg swelling  Acute on chronic congestive heart failure, unspecified heart failure type ([place])  Anemia, unspecified type    Plan admission  Rolland Porter, MD, Barbette Or, MD 02/25/18 [phone]

## 2018-02-25 NOTE — H&P (Signed)
History and Physical    [name] PQD:[id number] DOB: May 20, 1946 DOA: 02/25/2018  PCP: Biagio Borg, MD  Patient coming from: Home  I have personally briefly reviewed patient's old medical records in [place]  Chief Complaint: RLE swelling, DOE  HPI: [name] is a 72 y.o. male with medical history significant of HTN, hypertensive cardiomyopathy with grade 2 diastolic dysfunction, Xarelto for A.Fib and prior stroke.  Patient had removal of infected penile prosthesis on 7/29.  Held Xarelto for 2 days before surgery and 2 days after only.  Has had quite a bit of bleeding from wound since time of surgery it sounds like.  Has had worsening SOB, RLE swelling despite taking PO lasix at home.  Last evening patient was walking around his bed and hit his leg on the edge of the bed frame.  It has been bleeding and oozing.  Daughter also is concerned because he now has some blisters on his lower leg that he has not had before.  He states he is never had any open wounds on his legs before.  He denies fever or chills, he also denies pain in his leg.  He denies any chest pain, but states he has shortness of breath and dyspnea on exertion.  Has been on Augmentin BID and taking it for 2 weeks after surgery.  He has a follow-up appointment with his PCP on August 12, and his urologist the following day.   ED Course: CXR shows mild pulm edema, BNP 600, Trop 0.03.  HGB 9.7 down from 13.7 pre-op.   Review of Systems: As per HPI otherwise 10 point review of systems negative.   Past Medical History:  Diagnosis Date  . Adenoma 05/2008  . Alcoholism in recovery ([place])   . BPH (benign prostatic hyperplasia)   . Cancer [facility])    Prostate  . Chronic LBP    Hip & Back -- Sees Dr. Maia Petties @ [place]  . Colon polyps   . Complex renal cyst 12/2010  . Controlled type 2 diabetes mellitus with neuropathy ([place]) 01/2007  . COPD (chronic obstructive pulmonary disease) ([place])    . Diabetes ([place])   . Diverticulitis of colon   . ED (erectile dysfunction)    s/p Penile prosthesis (09/2010)  . History of prostate cancer    Dr. Karsten Ro  . History of sick sinus syndrome    reduced BB dose for Bradycardia  . HLD (hyperlipidemia)   . Hypertension   . Impaired glucose tolerance 12/21/2013  . Nephrolithiasis   . Osteoarthritis of both hips   . Osteoarthritis of lumbosacral spine    with Disc Disease  . PAF (paroxysmal atrial fibrillation) (HCC)    No longer on Amiodarone.  Not on Anticoaguation b/c no recurrence.  . Paresthesias/numbness    Bilateral LE  . Peripheral neuropathy 12/21/2013    Past Surgical History:  Procedure Laterality Date  . CARDIAC CATHETERIZATION  2003   Normal Coronary Arteries.  Marland Kitchen NM MYOVIEW LTD  03/2016   No Ischemia or Infarct - Visual EF ~60% (computer EF ~39%) - by Echo 55-60%  . PENILE PROSTHESIS IMPLANT  06/21/2012   Procedure: PENILE PROTHESIS INFLATABLE;  Surgeon: Claybon Jabs, MD;  Location: [facility];  Service: Urology;  Laterality: N/A;  REMOVAL AND REPLACEMENT OF SOME  OF PROSTHESIS (AMS)   . PENILE PROSTHESIS PLACEMENT  09/2010  . PROSTATECTOMY    . REMOVAL OF PENILE PROSTHESIS N/A 02/15/2018   Procedure:  REMOVAL OF PENILE PROSTHESIS;  Surgeon: Kathie Rhodes, MD;  Location: [facility];  Service: Urology;  Laterality: N/A;  . TRANSTHORACIC ECHOCARDIOGRAM  04/2016   Relatively normal EF of 55-60%. Normal wall motion, suggesting no prior MI. There is grade 2 diastolic dysfunction (pseudo-normal filling pattern) along with moderately dilated left atrium.     reports that he quit smoking about 35 years ago. His smoking use included cigarettes. He has never used smokeless tobacco. He reports that he drinks alcohol. He reports that he does not use drugs.  Allergies  Allergen Reactions  . Ciprofloxacin Other (See Comments)    All over weakness    Family History  Problem Relation Age of Onset  . Coronary artery  disease Mother   . Heart attack Mother   . Coronary artery disease Father   . Prostate cancer Father   . Diabetes Father      Prior to Admission medications   Medication Sig Start Date End Date Taking? Authorizing Provider  Albuterol Sulfate 108 (90 Base) MCG/ACT AEPB Inhale 2 puffs into the lungs every 6 (six) hours as needed. Patient taking differently: Inhale 2 puffs into the lungs every 6 (six) hours as needed (shortness of breath).  12/28/17  Yes Biagio Borg, MD  amLODipine (NORVASC) 10 MG tablet TAKE 1 TABLET BY MOUTH  DAILY 01/04/18  Yes Leonie Man, MD  amoxicillin-clavulanate (AUGMENTIN) 875-125 MG tablet Take 1 tablet by mouth 2 (two) times daily for 14 days. 02/15/18 03/01/18 Yes Wood, Case M, MD  aspirin 81 MG EC tablet Take 81 mg by mouth daily.     Yes [provider]  benazepril (LOTENSIN) 40 MG tablet TAKE 1 TABLET BY MOUTH  DAILY 12/15/17  Yes Leonie Man, MD  furosemide (LASIX) 40 MG tablet TAKE 1 TO 2 TABLETS DAILY AS NEEDED FOR WEIGHT GAIN FOR 3 LBS OR MORE. Patient taking differently: Take 20-80 mg by mouth daily as needed for edema.  10/28/17  Yes Leonie Man, MD  HYDROcodone-acetaminophen [facility]) 5-325 MG tablet Take 1-2 tablets by mouth every 4 (four) hours as needed for moderate pain. 02/15/18  Yes Wood, Case M, MD  ibuprofen (ADVIL,MOTRIN) 200 MG tablet Take 400-800 mg by mouth every 6 (six) hours as needed for moderate pain.    Yes [provider]  loratadine (CLARITIN) 10 MG tablet Take 10 mg by mouth daily as needed for allergies.   Yes [provider]  metFORMIN (GLUCOPHAGE-XR) 500 MG 24 hr tablet Take 1 tablet (500 mg total) by mouth daily with breakfast. 07/29/17  Yes Biagio Borg, MD  metoprolol succinate (TOPROL-XL) 100 MG 24 hr tablet Take 1 tablet (100 mg total) by mouth daily. 10/26/17  Yes Martinique, Peter M, MD  omeprazole (PRILOSEC) 20 MG capsule Take 1 capsule (20 mg total) by mouth daily. 10/26/17  Yes Leonie Man, MD    potassium chloride SA (KLOR-CON M20) 20 MEQ tablet Take 1 to 2 tablets as needed for weight gain  Of greater than 3 lbs.or more Patient taking differently: Take 20 mEq by mouth See admin instructions. Take 20 meq by mouth daily when taking 80 mg of furosemide.  Take 20 meq every other day if taking 20 or 40 mg of furosemide daily 10/28/17  Yes Leonie Man, MD  pravastatin (PRAVACHOL) 40 MG tablet Take 1 tablet (40 mg total) by mouth daily. 07/03/17  Yes Leonie Man, MD  spironolactone (ALDACTONE) 25 MG tablet TAKE 1 TABLET BY  MOUTH  DAILY 01/04/18  Yes Leonie Man, MD  XARELTO 20 MG TABS tablet TAKE 1 TABLET BY MOUTH  DAILY WITH SUPPER 08/03/17  Yes Leonie Man, MD  Blood Glucose Monitoring Suppl (ONE TOUCH ULTRA 2) w/Device KIT Use as directed E11.9 09/01/17   Biagio Borg, MD  glucose blood (ONE TOUCH ULTRA TEST) test strip Use as instructed once per day E11.9 09/02/17   Biagio Borg, MD  [facility] DELICA LANCETS 97J MISC USE 1 TIME PER DAY 10/20/17   Biagio Borg, MD    Physical Exam: Vitals:   02/24/18 2333 02/25/18 0230 02/25/18 0250 02/25/18 0441  BP: (!) 143/71 (!) 166/95 (!) 143/71 (!) 156/93  Pulse: 90 74 73 86  Resp: '17 15 13 ' (!) 28  Temp: 98.1 F (36.7 C)     TempSrc: Oral     SpO2: 99% 100% 100% 100%    Constitutional: NAD, calm, comfortable Eyes: PERRL, lids and conjunctivae normal ENMT: Mucous membranes are moist. Posterior pharynx clear of any exudate or lesions.Normal dentition.  Neck: normal, supple, no masses, no thyromegaly Respiratory: clear to auscultation bilaterally, no wheezing, no crackles. Normal respiratory effort. No accessory muscle use.  Cardiovascular: IRR, IRR Abdomen: no tenderness, no masses palpated. No hepatosplenomegaly. Bowel sounds positive.  Musculoskeletal: no clubbing / cyanosis. No joint deformity upper and lower extremities. Good ROM, no contractures. Normal muscle tone.  Skin: Edema of scrotum area, some serosanguinous  drainage on dressing, no obvious erythema, cellulitis, purulent drainage, etc.     Neurologic: CN 2-12 grossly intact. Sensation intact, DTR normal. Strength 5/5 in all 4.  Psychiatric: Normal judgment and insight. Alert and oriented x 3. Normal mood.    Labs on Admission: I have personally reviewed following labs and imaging studies  CBC: Recent Labs  Lab 02/25/18 0231  WBC 7.7  NEUTROABS 5.7  HGB 9.7*  HCT 28.9*  MCV 87.0  PLT 883   Basic Metabolic Panel: Recent Labs  Lab 02/25/18 0231  NA 138  K 4.1  CL 106  CO2 24  GLUCOSE 104*  BUN 15  CREATININE 1.11  CALCIUM 8.7*   GFR: Estimated Creatinine Clearance: 84.4 mL/min (by C-G formula based on SCr of 1.11 mg/dL). Liver Function Tests: Recent Labs  Lab 02/25/18 0231  AST 22  ALT 16  ALKPHOS 78  BILITOT 0.4  PROT 7.5  ALBUMIN 2.9*   No results for input(s): LIPASE, AMYLASE in the last 168 hours. No results for input(s): AMMONIA in the last 168 hours. Coagulation Profile: No results for input(s): INR, PROTIME in the last 168 hours. Cardiac Enzymes: Recent Labs  Lab 02/25/18 0231  TROPONINI 0.03*   BNP (last 3 results) No results for input(s): PROBNP in the last 8760 hours. HbA1C: No results for input(s): HGBA1C in the last 72 hours. CBG: No results for input(s): GLUCAP in the last 168 hours. Lipid Profile: No results for input(s): CHOL, HDL, LDLCALC, TRIG, CHOLHDL, LDLDIRECT in the last 72 hours. Thyroid Function Tests: No results for input(s): TSH, T4TOTAL, FREET4, T3FREE, THYROIDAB in the last 72 hours. Anemia Panel: No results for input(s): VITAMINB12, FOLATE, FERRITIN, TIBC, IRON, RETICCTPCT in the last 72 hours. Urine analysis:    Component Value Date/Time   COLORURINE YELLOW 07/29/2017 1146   APPEARANCEUR CLEAR 07/29/2017 1146   LABSPEC 1.025 07/29/2017 1146   PHURINE 6.0 07/29/2017 1146   GLUCOSEU NEGATIVE 07/29/2017 1146   HGBUR MODERATE (A) 07/29/2017 1146   BILIRUBINUR SMALL (A)  07/29/2017 1146  KETONESUR TRACE (A) 07/29/2017 1146   PROTEINUR NEGATIVE 08/20/2010 1029   UROBILINOGEN 1.0 07/29/2017 1146   NITRITE NEGATIVE 07/29/2017 1146   LEUKOCYTESUR NEGATIVE 07/29/2017 1146    Radiological Exams on Admission: Dg Chest 2 View  Result Date: 02/25/2018 CLINICAL DATA:  Acute onset of shortness of breath and leg swelling. EXAM: CHEST - 2 VIEW COMPARISON:  Chest radiograph performed 09/08/2014 FINDINGS: The lungs are well-aerated. Vascular congestion is noted. Mildly increased interstitial markings could reflect minimal interstitial edema. There is no evidence of pleural effusion or pneumothorax. The heart is borderline normal in size. No acute osseous abnormalities are seen. IMPRESSION: Vascular congestion noted. Mildly increased interstitial markings could reflect minimal interstitial edema. Electronically Signed   By: Garald Balding M.D.   On: 02/25/2018 03:03    EKG: Independently reviewed.  Assessment/Plan Principal Problem:   Acute on chronic diastolic CHF (congestive heart failure) (HCC) Active Problems:   Hypertensive heart disease with chronic diastolic congestive heart failure (HCC)   PAF (paroxysmal atrial fibrillation) - now off amiodarone; asymptomatic. CHA2DS2-VASc Score -3 (on Xarelto)   Diabetes (HCC)   Postoperative anemia due to acute blood loss    1. Acute on chronic diastolic CHF - 1. CHF pathway 2. Lasix 37m IV daily ordered 3. Strict intake and output 4. Continue aldactone 5. Daily BMP 6. 2d echo 2. PAF - 1. Continue Xarelto 3. Anemia - 1. Due to blood loss after operation most likely 2. Thankfully sounds like bleeding has slowed down dramatically in days since then 3. Will keep eye on CBCs while here 4. DM2 - 1. Continue metformin 5. HTN - continue home BP meds 6. Skin breakage on leg and leg swelling of RLE - 1. Wound care consult 2. Will obtain venous duplex of RLE to r/o DVT, but this seems less likely in a patient on chronic  xarelto, they only let him off the xarelto for 2 days before surgery and 2 days after. 7. S/P removal of infected penile implant - 1. Wound care to eval wound 2. Continue amoxicillin  DVT prophylaxis: Xarelto Code Status: Full Family Communication: Daughter at bedside Disposition Plan: Home after admit Consults called: None [facility] [facility] [facility] Pager [phone] works nights!  If 7AM-7PM, please contact the primary day team physician taking care of patient  [email or web address] Password [email or web address]  02/25/2018, 6:07 AM

## 2018-02-25 NOTE — Progress Notes (Signed)
Pt had 4 beats V tach. Asymptomatic. MD notified.

## 2018-02-25 NOTE — ED Notes (Signed)
ED TO INPATIENT HANDOFF REPORT  Name/Age/Gender [name] 72 y.o. male  Code Status    Code Status Orders  (From admission, onward)         Start     Ordered   02/25/18 0528  Full code  Continuous     02/25/18 0529        Code Status History    This patient has a current code status but no historical code status.      Home/SNF/Other Home  Chief Complaint Post-op Problem, Leg Swelling; Leg Drainage   Level of Care/Admitting Diagnosis ED Disposition    ED Disposition Condition Comment   Admit  Hospital Area: [place] [[id number]]  Level of Care: Telemetry [5]  Admit to tele based on following criteria: Acute CHF  Diagnosis: Acute on chronic diastolic CHF (congestive heart failure) [facility]) [[id number]]  Admitting Physician: Etta Quill [phone]  Attending Physician: Etta Quill [[id number]]  PT Class (Do Not Modify): Observation [104]  PT Acc Code (Do Not Modify): Observation [10022]       Medical History Past Medical History:  Diagnosis Date  . Adenoma 05/2008  . Alcoholism in recovery ([place])   . BPH (benign prostatic hyperplasia)   . Cancer [facility])    Prostate  . Chronic LBP    Hip & Back -- Sees Dr. Maia Petties @ [place]  . Colon polyps   . Complex renal cyst 12/2010  . Controlled type 2 diabetes mellitus with neuropathy ([place]) 01/2007  . COPD (chronic obstructive pulmonary disease) ([place])   . Diabetes ([place])   . Diverticulitis of colon   . ED (erectile dysfunction)    s/p Penile prosthesis (09/2010)  . History of prostate cancer    Dr. Karsten Ro  . History of sick sinus syndrome    reduced BB dose for Bradycardia  . HLD (hyperlipidemia)   . Hypertension   . Impaired glucose tolerance 12/21/2013  . Nephrolithiasis   . Osteoarthritis of both hips   . Osteoarthritis of lumbosacral spine    with Disc Disease  . PAF (paroxysmal atrial fibrillation) (HCC)    No longer on Amiodarone.  Not on Anticoaguation b/c no  recurrence.  . Paresthesias/numbness    Bilateral LE  . Peripheral neuropathy 12/21/2013    Allergies Allergies  Allergen Reactions  . Ciprofloxacin Other (See Comments)    All over weakness    IV Location/Drains/Wounds Patient Lines/Drains/Airways Status   Active Line/Drains/Airways    Name:   Placement date:   Placement time:   Site:   Days:   Peripheral IV 02/25/18 Right Antecubital   02/25/18    0535    Antecubital   less than 1   Open Drain 1 Medial Other (Comment)    02/15/18    0938    Other (Comment)   10   Open Drain 2 Medial Other (Comment)    02/15/18    0940    Other (Comment)   10   Open Drain 3 Right Other (Comment)    02/15/18    0953    Other (Comment)   10   Incision 06/21/12 Scrotum Mid   06/21/12    1004     2075   Incision (Closed) 02/15/18 Penis Other (Comment)   02/15/18    1000     10   Incision (Closed) 02/15/18 Abdomen   02/15/18    1018     10  Labs/Imaging Results for orders placed or performed during the hospital encounter of 02/25/18 (from the past 48 hour(s))  CBC with Differential     Status: Abnormal   Collection Time: 02/25/18  2:31 AM  Result Value Ref Range   WBC 7.7 4.0 - 10.5 K/uL   RBC 3.32 (L) 4.22 - 5.81 MIL/uL   Hemoglobin 9.7 (L) 13.0 - 17.0 g/dL   HCT 28.9 (L) 39.0 - 52.0 %   MCV 87.0 78.0 - 100.0 fL   MCH 29.2 26.0 - 34.0 pg   MCHC 33.6 30.0 - 36.0 g/dL   RDW 13.9 11.5 - 15.5 %   Platelets 359 150 - 400 K/uL   Neutrophils Relative % 73 %   Neutro Abs 5.7 1.7 - 7.7 K/uL   Lymphocytes Relative 17 %   Lymphs Abs 1.3 0.7 - 4.0 K/uL   Monocytes Relative 9 %   Monocytes Absolute 0.7 0.1 - 1.0 K/uL   Eosinophils Relative 1 %   Eosinophils Absolute 0.1 0.0 - 0.7 K/uL   Basophils Relative 0 %   Basophils Absolute 0.0 0.0 - 0.1 K/uL    Comment: Performed at [facility], [place] [address], [place], [place] [address]  Comprehensive metabolic panel     Status: Abnormal   Collection Time: 02/25/18  2:31 AM   Result Value Ref Range   Sodium 138 135 - 145 mmol/L   Potassium 4.1 3.5 - 5.1 mmol/L   Chloride 106 98 - 111 mmol/L   CO2 24 22 - 32 mmol/L   Glucose, Bld 104 (H) 70 - 99 mg/dL   BUN 15 8 - 23 mg/dL   Creatinine, Ser 1.11 0.61 - 1.24 mg/dL   Calcium 8.7 (L) 8.9 - 10.3 mg/dL   Total Protein 7.5 6.5 - 8.1 g/dL   Albumin 2.9 (L) 3.5 - 5.0 g/dL   AST 22 15 - 41 U/L   ALT 16 0 - 44 U/L   Alkaline Phosphatase 78 38 - 126 U/L   Total Bilirubin 0.4 0.3 - 1.2 mg/dL   GFR calc non Af Amer >60 >60 mL/min   GFR calc Af Amer >60 >60 mL/min    Comment: (NOTE) The eGFR has been calculated using the CKD EPI equation. This calculation has not been validated in all clinical situations. eGFR's persistently <60 mL/min signify possible Chronic Kidney Disease.    Anion gap 8 5 - 15    Comment: Performed at [facility], [place] [address], [place], [place] [address]  D-dimer, quantitative     Status: Abnormal   Collection Time: 02/25/18  2:31 AM  Result Value Ref Range   D-Dimer, Quant 1.40 (H) 0.00 - 0.50 ug/mL-FEU    Comment: (NOTE) At the manufacturer cut-off of 0.50 ug/mL FEU, this assay has been documented to exclude PE with a sensitivity and negative predictive value of 97 to 99%.  At this time, this assay has not been approved by the FDA to exclude DVT/VTE. Results should be correlated with clinical presentation. Performed at [facility], [place] [address], [place], [place] [address]   Troponin I     Status: Abnormal   Collection Time: 02/25/18  2:31 AM  Result Value Ref Range   Troponin I 0.03 (HH) <0.03 ng/mL    Comment: CRITICAL RESULT CALLED TO, READ BACK BY AND VERIFIED WITH: DOSTER,T RN AT 0321 02/25/18 BY TIBBITTS,K Performed at [facility], [place] [address], [place], [place] [address]   Brain  natriuretic peptide     Status: Abnormal   Collection Time: 02/25/18  2:32 AM  Result Value Ref Range   B Natriuretic Peptide 680.9  (H) 0.0 - 100.0 pg/mL    Comment: Performed at [facility], [place] [address], [place], [place] [address]  POC occult blood, ED RN will collect     Status: None   Collection Time: 02/25/18  4:46 AM  Result Value Ref Range   Fecal Occult Bld NEGATIVE NEGATIVE   Dg Chest 2 View  Result Date: 02/25/2018 CLINICAL DATA:  Acute onset of shortness of breath and leg swelling. EXAM: CHEST - 2 VIEW COMPARISON:  Chest radiograph performed 09/08/2014 FINDINGS: The lungs are well-aerated. Vascular congestion is noted. Mildly increased interstitial markings could reflect minimal interstitial edema. There is no evidence of pleural effusion or pneumothorax. The heart is borderline normal in size. No acute osseous abnormalities are seen. IMPRESSION: Vascular congestion noted. Mildly increased interstitial markings could reflect minimal interstitial edema. Electronically Signed   By: Garald Balding M.D.   On: 02/25/2018 03:03    Pending Labs Unresulted Labs (From admission, onward)    Start     Ordered   02/26/18 8381  Basic metabolic panel  Daily,   R     02/25/18 0529          Vitals/Pain Today's Vitals   02/24/18 2356 02/25/18 0230 02/25/18 0250 02/25/18 0441  BP:  (!) 166/95 (!) 143/71 (!) 156/93  Pulse:  74 73 86  Resp:  15 13 (!) 28  Temp:      TempSrc:      SpO2:  100% 100% 100%  PainSc: 0-No pain       Isolation Precautions No active isolations  Medications Medications  amoxicillin-clavulanate (AUGMENTIN) 875-125 MG per tablet 1 tablet (has no administration in time range)  amLODipine (NORVASC) tablet 10 mg (has no administration in time range)  Albuterol Sulfate AEPB 2 puff (has no administration in time range)  furosemide (LASIX) injection 40 mg (has no administration in time range)  benazepril (LOTENSIN) tablet 40 mg (has no administration in time range)  metFORMIN (GLUCOPHAGE-XR) 24 hr tablet 500 mg (has no administration in time range)   HYDROcodone-acetaminophen (NORCO/VICODIN) 5-325 MG per tablet 1-2 tablet (has no administration in time range)  ibuprofen (ADVIL,MOTRIN) tablet 400-800 mg (has no administration in time range)  spironolactone (ALDACTONE) tablet 25 mg (has no administration in time range)  pravastatin (PRAVACHOL) tablet 40 mg (has no administration in time range)  rivaroxaban (XARELTO) tablet 20 mg (has no administration in time range)  metoprolol succinate (TOPROL-XL) 24 hr tablet 100 mg (has no administration in time range)  pantoprazole (PROTONIX) EC tablet 40 mg (has no administration in time range)  sodium chloride flush (NS) 0.9 % injection 3 mL (has no administration in time range)  sodium chloride flush (NS) 0.9 % injection 3 mL (has no administration in time range)  0.9 %  sodium chloride infusion (has no administration in time range)  acetaminophen (TYLENOL) tablet 650 mg (has no administration in time range)  ondansetron (ZOFRAN) injection 4 mg (has no administration in time range)    Mobility walks with device

## 2018-02-25 NOTE — Progress Notes (Signed)
Pt agreed with CHF program with [facility]. Referral given to in house rep.

## 2018-02-25 NOTE — Discharge Instructions (Signed)
Heart Failure °Heart failure means your heart has trouble pumping blood. This makes it hard for your body to work well. Heart failure is usually a long-term (chronic) condition. You must take good care of yourself and follow your doctor's treatment plan. °Follow these instructions at home: °· Take your heart medicine as told by your doctor. °? Do not stop taking medicine unless your doctor tells you to. °? Do not skip any dose of medicine. °? Refill your medicines before they run out. °? Take other medicines only as told by your doctor or pharmacist. °· Stay active if told by your doctor. The elderly and people with severe heart failure should talk with a doctor about physical activity. °· Eat heart-healthy foods. Choose foods that are without trans fat and are low in saturated fat, cholesterol, and salt (sodium). This includes fresh or frozen fruits and vegetables, fish, lean meats, fat-free or low-fat dairy foods, whole grains, and high-fiber foods. Lentils and dried peas and beans (legumes) are also good choices. °· Limit salt if told by your doctor. °· Cook in a healthy way. Roast, grill, broil, bake, poach, steam, or stir-fry foods. °· Limit fluids as told by your doctor. °· Weigh yourself every morning. Do this after you pee (urinate) and before you eat breakfast. Write down your weight to give to your doctor. °· Take your blood pressure and write it down if your doctor tells you to. °· Ask your doctor how to check your pulse. Check your pulse as told. °· Lose weight if told by your doctor. °· Stop smoking or chewing tobacco. Do not use gum or patches that help you quit without your doctor's approval. °· Schedule and go to doctor visits as told. °· Nonpregnant women should have no more than 1 drink a day. Men should have no more than 2 drinks a day. Talk to your doctor about drinking alcohol. °· Stop illegal drug use. °· Stay current with shots (immunizations). °· Manage your health conditions as told by your  doctor. °· Learn to manage your stress. °· Rest when you are tired. °· If it is really hot outside: °? Avoid intense activities. °? Use air conditioning or fans, or get in a cooler place. °? Avoid caffeine and alcohol. °? Wear loose-fitting, lightweight, and light-colored clothing. °· If it is really cold outside: °? Avoid intense activities. °? Layer your clothing. °? Wear mittens or gloves, a hat, and a scarf when going outside. °? Avoid alcohol. °· Learn about heart failure and get support as needed. °· Get help to maintain or improve your quality of life and your ability to care for yourself as needed. °Contact a doctor if: °· You gain weight quickly. °· You are more short of breath than usual. °· You cannot do your normal activities. °· You tire easily. °· You cough more than normal, especially with activity. °· You have any or more puffiness (swelling) in areas such as your hands, feet, ankles, or belly (abdomen). °· You cannot sleep because it is hard to breathe. °· You feel like your heart is beating fast (palpitations). °· You get dizzy or light-headed when you stand up. °Get help right away if: °· You have trouble breathing. °· There is a change in mental status, such as becoming less alert or not being able to focus. °· You have chest pain or discomfort. °· You faint. °This information is not intended to replace advice given to you by your health care provider. Make sure you   discuss any questions you have with your health care provider. Document Released: 04/15/2008 Document Revised: 12/13/2015 Document Reviewed: 08/23/2012 Elsevier Interactive Patient Education  2017 [employer]   Edema Edema is when you have too much fluid in your body or under your skin. Edema may make your legs, feet, and ankles swell up. Swelling is also common in looser tissues, like around your eyes. This is a common condition. It gets more common as you get older. There are many possible causes of edema. Eating too much  salt (sodium) and being on your feet or sitting for a long time can cause edema in your legs, feet, and ankles. Hot weather may make edema worse. Edema is usually painless. Your skin may look swollen or shiny. Follow these instructions at home:  Keep the swollen body part raised (elevated) above the level of your heart when you are sitting or lying down.  Do not sit still or stand for a long time.  Do not wear tight clothes. Do not wear garters on your upper legs.  Exercise your legs. This can help the swelling go down.  Wear elastic bandages or support stockings as told by your doctor.  Eat a low-salt (low-sodium) diet to reduce fluid as told by your doctor.  Depending on the cause of your swelling, you may need to limit how much fluid you drink (fluid restriction).  Take over-the-counter and prescription medicines only as told by your doctor. Contact a doctor if:  Treatment is not working.  You have heart, liver, or kidney disease and have symptoms of edema.  You have sudden and unexplained weight gain. Get help right away if:  You have shortness of breath or chest pain.  You cannot breathe when you lie down.  You have pain, redness, or warmth in the swollen areas.  You have heart, liver, or kidney disease and get edema all of a sudden.  You have a fever and your symptoms get worse all of a sudden. Summary  Edema is when you have too much fluid in your body or under your skin.  Edema may make your legs, feet, and ankles swell up. Swelling is also common in looser tissues, like around your eyes.  Raise (elevate) the swollen body part above the level of your heart when you are sitting or lying down.  Follow your doctor's instructions about diet and how much fluid you can drink (fluid restriction). This information is not intended to replace advice given to you by your health care provider. Make sure you discuss any questions you have with your health care  provider. Document Released: 12/24/2007 Document Revised: 07/25/2016 Document Reviewed: 07/25/2016 Elsevier Interactive Patient Education  2017 [employer].

## 2018-02-25 NOTE — Consult Note (Addendum)
   [facility] CM Inpatient Consult   02/25/2018  [name] 1946/06/09 [id number]   Referral received from inpatient [facility] for [place] [facility] program services.   Went to bedside to speak with [name] about [place] [facility] services. He is agreeable and [facility] written consent obtained. [facility] folder provided.   Explained that [place] [facility] will not interfere or replace services provided by home health.   [name] reports he lives with his daughter.  Denies having any concerns with medications or with transportation. Confirms Primary Care MD is Dr. Jenny Reichmann (PCP office listed as doing transition of care call).  [name] denies having a scale. States he can get a scale however. [name] is agreeable to referral to [facility] for tele-health program for HF.   Due to multiple comorbidities, including CHF, DM, PAF, hypertensive heart disease will make referral to [facility].   Spoke with inpatient RNCM to make aware [facility] will follow. Inpatient RNCM indicates [name] will have home health arranged as well.   Addendum: Made referral to [facility] CHF tele-health program. Spoke with Tawanna Sat to make referral. Will update Tawanna Sat on which [place] is assigned.   Marthenia Rolling, MSN-Ed, RN,BSN [facility] Liaison [phone]

## 2018-02-25 NOTE — Evaluation (Addendum)
Physical Therapy Evaluation Patient Details Name: [name] MRN: [id number] DOB: 05/23/1946 Today's Date: 02/25/2018   History of Present Illness  72 YO male, admitted 8/7 for RLE edema and ulceration. S/p penile prosthesis removal on 02/15/2018. PMH includes prostatectomy in 2007, penile prosthesis in 2013, DMII, afib, dyspnea, peripheral neuropathy, COPD, ED, hip OA, lumbosacral spine OA.   Clinical Impression  Pt is a 72 YO male admitted on 8/7 for RLE edema and ulceration, had penile prosthesis removed on 7/29. Pt reports that surgeon told him to rest after his surgery, and pt thinks that his LE edema R>L is related to inactivity. Pt presents with some shortness of breath after ambulation, kyphotic posture, and gait abnormalities thought to be related to scrotal surgery. Pt educated on elevating LEs with pillows and performing LE exercises like ankle pumps and quad sets to reduce edema. Pt has no further PT needs at this time, to be d/c home with daughter.     Follow Up Recommendations No PT follow up    Equipment Recommendations  None recommended by PT    Recommendations for Other Services       Precautions / Restrictions Restrictions Weight Bearing Restrictions: No      Mobility  Bed Mobility Overal bed mobility: Modified Independent             General bed mobility comments: increased time   Transfers Overall transfer level: Modified independent               General transfer comment: increased time for sit to stand, stand to sit   Ambulation/Gait Ambulation/Gait assistance: Supervision Gait Distance (Feet): 200 Feet   Gait Pattern/deviations: Step-through pattern;Wide base of support;Decreased stride length Gait velocity: decreased    General Gait Details: wide BOS with lateral weight shifting during forward progression, suspected to accomodate surgical area in scrotal region   Stairs            Wheelchair Mobility    Modified Rankin  (Stroke Patients Only)       Balance Overall balance assessment: Needs assistance Sitting-balance support: Feet supported;No upper extremity supported Sitting balance-Leahy Scale: Normal     Standing balance support: No upper extremity supported Standing balance-Leahy Scale: Good Standing balance comment: wide base of support, difficulty with turning                              Pertinent Vitals/Pain Pain Assessment: No/denies pain    Home Living Family/patient expects to be discharged to:: Private residence Living Arrangements: Children Available Help at Discharge: Family;Available PRN/intermittently Type of Home: House Home Access: Stairs to enter Entrance Stairs-Rails: Left Entrance Stairs-Number of Steps: 3 Home Layout: One level Home Equipment: Cane - single point;Crutches Additional Comments: uses cane for ambulation in community, no AD use in home.     Prior Function Level of Independence: Independent               Hand Dominance        Extremity/Trunk Assessment   Upper Extremity Assessment Upper Extremity Assessment: Overall WFL for tasks assessed    Lower Extremity Assessment Lower Extremity Assessment: Overall WFL for tasks assessed(MMT LE screen, demonstrates normal strength bilat)    Cervical / Trunk Assessment Cervical / Trunk Assessment: Kyphotic(forward head )  Communication   Communication: No difficulties  Cognition Arousal/Alertness: Awake/alert Behavior During Therapy: WFL for tasks assessed/performed Overall Cognitive Status: Within Functional Limits for tasks assessed  General Comments      Exercises General Exercises - Lower Extremity Ankle Circles/Pumps: AROM;20 reps;Both;Seated Quad Sets: AROM;Both;5 reps;Seated   Assessment/Plan    PT Assessment Patent does not need any further PT services  PT Problem List         PT Treatment Interventions       PT Goals (Current goals can be found in the Care Plan section)  Acute Rehab PT Goals PT Goal Formulation: All assessment and education complete, DC therapy    Frequency     Barriers to discharge        Co-evaluation               AM-PAC PT "6 Clicks" Daily Activity  Outcome Measure Difficulty turning over in bed (including adjusting bedclothes, sheets and blankets)?: None Difficulty moving from lying on back to sitting on the side of the bed? : None Difficulty sitting down on and standing up from a chair with arms (e.g., wheelchair, bedside commode, etc,.)?: A Little Help needed moving to and from a bed to chair (including a wheelchair)?: A Little Help needed walking in hospital room?: A Little Help needed climbing 3-5 steps with a railing? : A Little 6 Click Score: 20    End of Session Equipment Utilized During Treatment: Gait belt Activity Tolerance: Patient tolerated treatment well Patient left: in chair;with call bell/phone within reach Nurse Communication: Mobility status PT Visit Diagnosis: Other abnormalities of gait and mobility (R26.89);Difficulty in walking, not elsewhere classified (R26.2)    Time: 3887-1959 PT Time Calculation (min) (ACUTE ONLY): 20 min   Charges:   PT Evaluation $PT Eval Low Complexity: 1 Low          Alexa Conception Chancy, PT, DPT  Pager # [phone]   Alexa D Eure 02/25/2018, 3:00 PM

## 2018-02-25 NOTE — ED Notes (Signed)
Date and time results received: 02/25/18 0358   Test: Troponin  Critical Value: 0.03  Name of Provider Notified: Dr. Eliane Decree  Orders Received? Or Actions Taken?: Provider notified

## 2018-02-25 NOTE — Care Management Note (Signed)
Case Management Note  Patient Details  Name: Caleb Decock MRN: [id number] Date of Birth: 10/21/1945  Subjective/Objective:                    Action/Plan: Pt admitted with CHF. Will discharge with [facility] CHF program.   Expected Discharge Date:  02/25/18               Expected Discharge Plan:  [place]  In-House Referral:     Discharge planning Services  CM Consult  Post Acute Care Choice:    Choice offered to:  Patient  DME Arranged:    DME Agency:     HH Arranged:  RN [place] Agency:  [facility] (now [facility])  Status of Service:  Completed, signed off  If discussed at [employer] of Stay Meetings, dates discussed:    Additional CommentsPurcell Mouton, RN 02/25/2018, 12:35 PM

## 2018-02-25 NOTE — Consult Note (Signed)
[place] Nurse wound consult note Reason for Consult:RLE partial thickness wounds, and penile partial thickness wound Wound type:trauma and venous insufficiency for leg wound and surgical scrotal wound from penile implant removal. Pressure Injury POA: NA Measurement:RLE has 5cm x 4cm x 0.1cm  area with multiple partial thickness wounds where leg was swollen from HF and skin blistered and opened. Beds are pink, moist, no drainage or odor. Scrotal area has 2cm round x 0.1cm surgical wound with red wound bed, moist, scant bloody drainage, no odor. Wound ZOX:WRUE above Drainage (amount, consistency, odor) see above Periwound: intact Dressing procedure/placement/frequency: I have provided nurses with orders for Xeroform to RLE wounds and vaseline gauze to surgical wound on scrotum. Patient completely surprised to hear about heart failure. States he has never heard he had it. Patient education packet printed out and given to pt.  Patient had echo 2017 with EF of 55-60%. Explained this was not a new diagnosis, explained the importance of knowledge of disease process, encouraged to read information packet. We will not follow, but will remain available to this patient, to nursing, and the medical and/or surgical teams. Please re-consult if we need to assist further.  Fara Olden, RN-C, WTA-C, [place] Wound Treatment Associate [facility] Care Associate

## 2018-02-25 NOTE — Progress Notes (Signed)
Pt had 2.10 pause. MD notified. Continue to monitor.

## 2018-02-25 NOTE — Progress Notes (Signed)
Bilateral lower extremity venous duplex has been completed. Negative for DVT. Results were given to Dr. [place] Crane.  02/25/18 9:16 AM Carlos Levering RVT

## 2018-02-25 NOTE — Discharge Summary (Addendum)
Discharge Summary  [name] SUO:[id number] DOB: [birth year]  PCP: Parker Borg, MD  Admit date: 02/25/2018 Discharge date: 02/25/2018  Time spent: 25 minutes  Recommendations for Outpatient Follow-up:  1. Up with your cardiologist Dr. Ellyn [name] 2. Follow-up with your PCP Dr. Jeneen [name] 3. Take your medications as prescribed 4. Continue wound care dressing  RECOMMENDATIONS PER WOUND CARE SPECIALIST: [facility] Nurse wound consult note Reason for Consult:RLE partial thickness wounds, and penile partial thickness wound Wound type:trauma and venous insufficiency for leg wound and surgical scrotal wound from penile implant removal. Pressure Injury POA: NA Measurement:RLE has 5cm x 4cm x 0.1cm  area with multiple partial thickness wounds where leg was swollen from HF and skin blistered and opened. Beds are pink, moist, no drainage or odor. Scrotal area has 2cm round x 0.1cm surgical wound with red wound bed, moist, scant bloody drainage, no odor. Wound FEX:MDYJ above Drainage (amount, consistency, odor) see above Periwound: intact Dressing procedure/placement/frequency: I have provided nurses with orders for Xeroform to RLE wounds and vaseline gauze to surgical wound on scrotum. Patient completely surprised to hear about heart failure. States he has never heard he had it. Patient education packet printed out and given to pt.  Patient had echo 2017 with EF of 55-60%. Explained this was not a new diagnosis, explained the importance of knowledge of disease process, encouraged to read information packet. We will not follow, but will remain available to this patient, to nursing, and the medical and/or surgical teams. Please re-consult if we need to assist further.  Parker Olden, RN-C, WTA-C, [facility]  Discharge Diagnoses:  Active Hospital Problems   Diagnosis Date Noted  . Acute on chronic diastolic CHF (congestive heart failure) ([place]) 02/25/2018  .  Postoperative anemia due to acute blood loss 02/25/2018  . Diabetes ([place])   . PAF (paroxysmal atrial fibrillation) - now off amiodarone; asymptomatic. CHA2DS2-VASc Score -3 (on Xarelto) 01/02/2011  . Hypertensive heart disease with chronic diastolic congestive heart failure ([place]) 02/11/2007    Resolved Hospital Problems  No resolved problems to display.    Discharge Condition: Stable  Diet recommendation: Heart healthy diet with low sodium  Vitals:   02/25/18 0616 02/25/18 0655  BP: (!) 165/93 (!) 165/76  Pulse: 83 82  Resp: 17   Temp:  98.2 F (36.8 C)  SpO2: 99% 100%    History of present illness:  [name] is a 72 y.o. male with medical history significant of HTN, hypertensive cardiomyopathy with grade 2 diastolic dysfunction, Xarelto for A.Fib and prior stroke.  Patient had removal of infected penile prosthesis on 7/29.  Held Xarelto for 2 days before surgery and 2 days after only.  Has had quite a bit of bleeding from wound since time of surgery it sounds like.  Has had worsening SOB, RLE swelling despite taking PO lasix at home.  Last evening patient was walking around his bed and hit his leg on the edge of the bed frame. It has been bleeding and oozing. Daughter also is concerned because he now has some blisters on his lower leg that he has not had before. He states he is never had any open wounds on his legs before. He denies fever or chills, he also denies pain in his leg. He denies any chest pain, but states he has shortness of breath and dyspnea on exertion.  Has been on Augmentin BID and taking it for 2 weeks after surgery.  He has a follow-up appointment  with his PCP on August 12, and his urologist the following day.  HGB 9.7 down from 13.7 pre-op. FOBT negative. Total bilirubin negative. No sign of hemolysis.  02/25/18: Patient seen and examined at his bedside.  Denies chest pain, palpitations, or dyspnea at rest.  He has no new complaints.  On  the day of discharge, the patient was hemodynamically stable.  He will need to follow-up with his cardiologist and PCP post hospitalization.  Called the patient's daughter, [name], updated her on current medical conditions and plan for discharge to home with [facility]. All questions answered to her satisfaction.  Hospital Course:  Principal Problem:   Acute on chronic diastolic CHF (congestive heart failure) (HCC) Active Problems:   Hypertensive heart disease with chronic diastolic congestive heart failure (HCC)   PAF (paroxysmal atrial fibrillation) - now off amiodarone; asymptomatic. CHA2DS2-VASc Score -3 (on Xarelto)   Diabetes (HCC)   Postoperative anemia due to acute blood loss  1. Acute on chronic diastolic CHF - 1. CHF pathway 2. Lasix 8m IV x2 doses 3. Strict intake and output 4. Continue aldactone and his other cardiac meds 5. 2d echo 6. Follow-up with your cardiologist outpatient 2. PAF - 1. Continue Xarelto 2. Rate controlled on metoprolol 3. Anemia - 1. Due to blood loss after operation most likely 2. Thankfully sounds like bleeding has slowed down dramatically in days since then 4. DM2 - 1. Continue metformin 5. HTN - continue home BP meds 6. Skin breakage on leg and leg swelling of RLE - 1. Wound care consult-recommendations as stated above 2. Venous Doppler ultrasound negative for DVT 7. S/P removal of infected penile implant  1. Continue amoxicillin 2. Follow-up with your urologist outpatient   Procedures:  None  Consultations:  Wound care specialist  Discharge Exam: BP (!) 165/76 (BP Location: Left Arm)   Pulse 82   Temp 98.2 F (36.8 C) (Oral)   Resp 17   SpO2 100%  . General: 72y.o. year-old male well developed well nourished in no acute distress.  Alert and oriented x3. . Cardiovascular: Regular rate and rhythm with no rubs or gallops.  No thyromegaly or JVD noted.   .Marland KitchenRespiratory: Clear to auscultation with no wheezes or rales. Good inspiratory  effort. . Abdomen: Soft nontender nondistended with normal bowel sounds x4 quadrants. . Musculoskeletal: No lower extremity edema. 2/4 pulses in all 4 extremities. . Skin: No ulcerative lesions noted or rashes, . Psychiatry: Mood is appropriate for condition and setting  Discharge Instructions You were cared for by a hospitalist during your hospital stay. If you have any questions about your discharge medications or the care you received while you were in the hospital after you are discharged, you can call the unit and asked to speak with the hospitalist on call if the hospitalist that took care of you is not available. Once you are discharged, your primary care physician will handle any further medical issues. Please note that NO REFILLS for any discharge medications will be authorized once you are discharged, as it is imperative that you return to your primary care physician (or establish a relationship with a primary care physician if you do not have one) for your aftercare needs so that they can reassess your need for medications and monitor your lab values.   Allergies as of 02/25/2018      Reactions   Ciprofloxacin Other (See Comments)   All over weakness      Medication List    STOP taking  these medications   ibuprofen 200 MG tablet Commonly known as:  ADVIL,MOTRIN     TAKE these medications   Albuterol Sulfate 108 (90 Base) MCG/ACT Aepb Inhale 2 puffs into the lungs every 6 (six) hours as needed. What changed:  reasons to take this   amLODipine 10 MG tablet Commonly known as:  NORVASC TAKE 1 TABLET BY MOUTH  DAILY   amoxicillin-clavulanate 875-125 MG tablet Commonly known as:  AUGMENTIN Take 1 tablet by mouth 2 (two) times daily for 14 days.   aspirin 81 MG EC tablet Take 81 mg by mouth daily.   benazepril 40 MG tablet Commonly known as:  LOTENSIN TAKE 1 TABLET BY MOUTH  DAILY   furosemide 40 MG tablet Commonly known as:  LASIX TAKE 1 TO 2 TABLETS DAILY AS NEEDED  FOR WEIGHT GAIN FOR 3 LBS OR MORE. What changed:    how much to take  how to take this  when to take this  reasons to take this  additional instructions   glucose blood test strip Use as instructed once per day E11.9   HYDROcodone-acetaminophen 5-325 MG tablet Commonly known as:  NORCO/VICODIN Take 1-2 tablets by mouth every 4 (four) hours as needed for moderate pain.   loratadine 10 MG tablet Commonly known as:  CLARITIN Take 10 mg by mouth daily as needed for allergies.   metFORMIN 500 MG 24 hr tablet Commonly known as:  GLUCOPHAGE-XR Take 1 tablet (500 mg total) by mouth daily with breakfast.   metoprolol succinate 100 MG 24 hr tablet Commonly known as:  TOPROL-XL Take 1 tablet (100 mg total) by mouth daily.   omeprazole 20 MG capsule Commonly known as:  PRILOSEC Take 1 capsule (20 mg total) by mouth daily.   ONE TOUCH ULTRA 2 w/Device Kit Use as directed P23.3   ONETOUCH DELICA LANCETS 00T Misc USE 1 TIME PER DAY   potassium chloride SA 20 MEQ tablet Commonly known as:  K-DUR,KLOR-CON Take 1 to 2 tablets as needed for weight gain  Of greater than 3 lbs.or more What changed:    how much to take  how to take this  when to take this  additional instructions   pravastatin 40 MG tablet Commonly known as:  PRAVACHOL Take 1 tablet (40 mg total) by mouth daily.   spironolactone 25 MG tablet Commonly known as:  ALDACTONE TAKE 1 TABLET BY MOUTH  DAILY   XARELTO 20 MG Tabs tablet Generic drug:  rivaroxaban TAKE 1 TABLET BY MOUTH  DAILY WITH SUPPER      Allergies  Allergen Reactions  . Ciprofloxacin Other (See Comments)    All over weakness   Follow-up Information    Parker Borg, MD. Call in 1 day(s).   Specialties:  Internal Medicine, Radiology Contact information: [address] [place] [address] [phone]        Leonie Man, MD. Call in 1 day(s).   Specialty:  Cardiology Why:  Please call for appointment  02/25/18. Contact information: [address] [place] [address] [phone]            The results of significant diagnostics from this hospitalization (including imaging, microbiology, ancillary and laboratory) are listed below for reference.    Significant Diagnostic Studies: Dg Chest 2 View  Result Date: 02/25/2018 CLINICAL DATA:  Acute onset of shortness of breath and leg swelling. EXAM: CHEST - 2 VIEW COMPARISON:  Chest radiograph performed 09/08/2014 FINDINGS: The lungs are  well-aerated. Vascular congestion is noted. Mildly increased interstitial markings could reflect minimal interstitial edema. There is no evidence of pleural effusion or pneumothorax. The heart is borderline normal in size. No acute osseous abnormalities are seen. IMPRESSION: Vascular congestion noted. Mildly increased interstitial markings could reflect minimal interstitial edema. Electronically Signed   By: Garald Balding M.D.   On: 02/25/2018 03:03    Microbiology: No results found for this or any previous visit (from the past 240 hour(s)).   Labs: Basic Metabolic Panel: Recent Labs  Lab 02/25/18 0231  NA 138  K 4.1  CL 106  CO2 24  GLUCOSE 104*  BUN 15  CREATININE 1.11  CALCIUM 8.7*   Liver Function Tests: Recent Labs  Lab 02/25/18 0231  AST 22  ALT 16  ALKPHOS 78  BILITOT 0.4  PROT 7.5  ALBUMIN 2.9*   No results for input(s): LIPASE, AMYLASE in the last 168 hours. No results for input(s): AMMONIA in the last 168 hours. CBC: Recent Labs  Lab 02/25/18 0231  WBC 7.7  NEUTROABS 5.7  HGB 9.7*  HCT 28.9*  MCV 87.0  PLT 359   Cardiac Enzymes: Recent Labs  Lab 02/25/18 0231  TROPONINI 0.03*   BNP: BNP (last 3 results) Recent Labs    02/25/18 0232  BNP 680.9*    ProBNP (last 3 results) No results for input(s): PROBNP in the last 8760 hours.  CBG: No results for input(s): GLUCAP in the last 168 hours.     Signed:  Kayleen Memos, MD [facility] 02/25/2018, 10:36 AM

## 2018-02-26 ENCOUNTER — Telehealth: Payer: Self-pay | Admitting: *Deleted

## 2018-02-26 ENCOUNTER — Other Ambulatory Visit: Payer: Self-pay | Admitting: *Deleted

## 2018-02-26 NOTE — Patient Outreach (Signed)
[facility]) Care Management  02/26/2018  Alphonsus Fomby 1946/03/27 [id number]   Referral received from hospital liaison as member was recently admitted to hospital under observation for heart failure.  Per chart, he also has history of atrial fibrillation, COPD, diabetes, and hyperlipidemia.    Call placed to member, identity verified.  This care manager introduced self and purpose of call.  [facility] care management services explained.  He report he is doing well and does not feel he is in need of additional services.  Benefits of [facility] explained, he again declines involvement.  He has follow up appointments scheduled with primary MD and cardiology this week.  Will not open case at this time, will notify primary MD.    Valente David, RN, MSN [place] Manager [phone]

## 2018-02-26 NOTE — Telephone Encounter (Signed)
Called pt concerning appt that has been schedule for 03/01/18 @ 9:20 am. Pt was D/C from hos on 02/25/18, and also need hosp f/u appt. Completed TCM call and change to appt to hosp f/u.[name]  Transition Care Management Follow-up Telephone Call   Date discharged? 02/25/18   How have you been since you were released from the hospital? Pt states he is doing fairly well   Do you understand why you were in the hospital? YES   Do you understand the discharge instructions? YES   Where were you discharged to? Home   Items Reviewed:  Medications reviewed: YES  Allergies reviewed: YES  Dietary changes reviewed: YES, heart healthy  Referrals reviewed: YES, pt states he see his cardiologist on 8/22.    Functional Questionnaire:   Activities of Daily Living (ADLs):   He states he are independent in the following: bathing and hygiene, feeding, continence, grooming, toileting and dressing States they require assistance with the following: ambulation   Any transportation issues/concerns?: NO   Any patient concerns? NO   Confirmed importance and date/time of follow-up visits scheduled YES, appt 03/01/18  Provider Appointment booked with Dr. Jenny Reichmann   Confirmed with patient if condition begins to worsen call PCP or go to the ER.  Patient was given the office number and encouraged to call back with question or concerns.  : YES

## 2018-03-01 ENCOUNTER — Encounter: Payer: Self-pay | Admitting: Internal Medicine

## 2018-03-01 ENCOUNTER — Other Ambulatory Visit (INDEPENDENT_AMBULATORY_CARE_PROVIDER_SITE_OTHER): Payer: Medicare Other

## 2018-03-01 ENCOUNTER — Ambulatory Visit (INDEPENDENT_AMBULATORY_CARE_PROVIDER_SITE_OTHER): Payer: Medicare Other | Admitting: Internal Medicine

## 2018-03-01 VITALS — BP 154/82 | HR 100 | Temp 98.4°F | Ht 73.0 in | Wt 266.0 lb

## 2018-03-01 DIAGNOSIS — D62 Acute posthemorrhagic anemia: Secondary | ICD-10-CM

## 2018-03-01 DIAGNOSIS — S3130XD Unspecified open wound of scrotum and testes, subsequent encounter: Secondary | ICD-10-CM

## 2018-03-01 DIAGNOSIS — S3130XA Unspecified open wound of scrotum and testes, initial encounter: Secondary | ICD-10-CM | POA: Insufficient documentation

## 2018-03-01 DIAGNOSIS — E119 Type 2 diabetes mellitus without complications: Secondary | ICD-10-CM

## 2018-03-01 DIAGNOSIS — I5033 Acute on chronic diastolic (congestive) heart failure: Secondary | ICD-10-CM

## 2018-03-01 LAB — CBC WITH DIFFERENTIAL/PLATELET
Basophils Absolute: 0 10*3/uL (ref 0.0–0.1)
Basophils Relative: 0.2 % (ref 0.0–3.0)
Eosinophils Absolute: 0 10*3/uL (ref 0.0–0.7)
Eosinophils Relative: 0.5 % (ref 0.0–5.0)
HCT: 33.5 % — ABNORMAL LOW (ref 39.0–52.0)
Hemoglobin: 11.1 g/dL — ABNORMAL LOW (ref 13.0–17.0)
Lymphocytes Relative: 17.3 % (ref 12.0–46.0)
Lymphs Abs: 1.7 10*3/uL (ref 0.7–4.0)
MCHC: 33.2 g/dL (ref 30.0–36.0)
MCV: 87.7 fl (ref 78.0–100.0)
Monocytes Absolute: 1.2 10*3/uL — ABNORMAL HIGH (ref 0.1–1.0)
Monocytes Relative: 12.5 % — ABNORMAL HIGH (ref 3.0–12.0)
Neutro Abs: 6.7 10*3/uL (ref 1.4–7.7)
Neutrophils Relative %: 69.5 % (ref 43.0–77.0)
Platelets: 510 10*3/uL — ABNORMAL HIGH (ref 150.0–400.0)
RBC: 3.82 Mil/uL — ABNORMAL LOW (ref 4.22–5.81)
RDW: 14.4 % (ref 11.5–15.5)
WBC: 9.6 10*3/uL (ref 4.0–10.5)

## 2018-03-01 NOTE — Patient Instructions (Signed)
Please contact [facility] about the scale and check your weight every morning to track this before you see Dr Ellyn Hack  Please continue all other medications as before, and refills have been done if requested.  Please have the pharmacy call with any other refills you may need.  Please continue your efforts at being more active, low cholesterol diet, and weight control..  Please keep your appointments with your specialists as you may have planned - Dr Ellyn Hack, and Urology tomorrow  Please go to the LAB in the Basement (turn left off the elevator) for the tests to be done today  You will be contacted by phone if any changes need to be made immediately.  Otherwise, you will receive a letter about your results with an explanation, but please check with MyChart first.  Please remember to sign up for MyChart if you have not done so, as this will be important to you in the future with finding out test results, communicating by private email, and scheduling acute appointments online when needed.  Please return in 4 months, or sooner if needed

## 2018-03-01 NOTE — Progress Notes (Signed)
Subjective:    Patient ID: [name], male    DOB: 01/30/1946, 72 y.o.   MRN: [id number]  HPI  Here to f/u recent hospn 8/8 with CHF with lower wt and near resolved LE edema after IV lasix, but now with persistent lower wt but trace bilat leg edema now come back. Also with scrotal abscess, now finished antibx, has pads in place to scrotum and slight BRB with changing pads, but yesterday did have a very large clot extruded from right testicle side, without fever, red, pus or Denies urinary symptoms such as dysuria, frequency, urgency, flank pain, hematuria or n/v, fever, chills.  Pt denies new neurological symptoms such as new headache, or facial or extremity weakness or numbness   Pt denies polydipsia, polyuria  Overall wt down 9 lbs.  Wt Readings from Last 3 Encounters:  03/01/18 266 lb (120.7 kg)  02/25/18 272 lb 3.2 oz (123.5 kg)  02/15/18 275 lb (124.7 kg)  Has appt with Dr Ellyn Hack later this mon. Now off ibuprofen with anticoagulant, he is trying to avoid narcotic and fluid retention.  Pain overall tolerable on tylenol.  Declines tramadol.   Past Medical History:  Diagnosis Date  . Adenoma 05/2008  . Alcoholism in recovery ([place])   . BPH (benign prostatic hyperplasia)   . Cancer [facility])    Prostate  . Chronic LBP    Hip & Back -- Sees Dr. Maia Petties @ [place]  . Colon polyps   . Complex renal cyst 12/2010  . Controlled type 2 diabetes mellitus with neuropathy ([place]) 01/2007  . COPD (chronic obstructive pulmonary disease) ([place])   . Diabetes ([place])   . Diverticulitis of colon   . ED (erectile dysfunction)    s/p Penile prosthesis (09/2010)  . History of prostate cancer    Dr. Karsten Ro  . History of sick sinus syndrome    reduced BB dose for Bradycardia  . HLD (hyperlipidemia)   . Hypertension   . Impaired glucose tolerance 12/21/2013  . Nephrolithiasis   . Osteoarthritis of both hips   . Osteoarthritis of lumbosacral spine    with Disc Disease  . PAF  (paroxysmal atrial fibrillation) (HCC)    No longer on Amiodarone.  Not on Anticoaguation b/c no recurrence.  . Paresthesias/numbness    Bilateral LE  . Peripheral neuropathy 12/21/2013   Past Surgical History:  Procedure Laterality Date  . CARDIAC CATHETERIZATION  2003   Normal Coronary Arteries.  Marland Kitchen NM MYOVIEW LTD  03/2016   No Ischemia or Infarct - Visual EF ~60% (computer EF ~39%) - by Echo 55-60%  . PENILE PROSTHESIS IMPLANT  06/21/2012   Procedure: PENILE PROTHESIS INFLATABLE;  Surgeon: Claybon Jabs, MD;  Location: [facility];  Service: Urology;  Laterality: N/A;  REMOVAL AND REPLACEMENT OF SOME  OF PROSTHESIS ([facility])   . PENILE PROSTHESIS PLACEMENT  09/2010  . PROSTATECTOMY    . REMOVAL OF PENILE PROSTHESIS N/A 02/15/2018   Procedure: REMOVAL OF PENILE PROSTHESIS;  Surgeon: Kathie Rhodes, MD;  Location: [facility];  Service: Urology;  Laterality: N/A;  . TRANSTHORACIC ECHOCARDIOGRAM  04/2016   Relatively normal EF of 55-60%. Normal wall motion, suggesting no prior MI. There is grade 2 diastolic dysfunction (pseudo-normal filling pattern) along with moderately dilated left atrium.    reports that he quit smoking about 35 years ago. His smoking use included cigarettes. He has never used smokeless tobacco. He reports that he drinks alcohol. He reports that he does  not use drugs. family history includes Coronary artery disease in his father and mother; Diabetes in his father; Heart attack in his mother; Prostate cancer in his father. Allergies  Allergen Reactions  . Ciprofloxacin Other (See Comments)    All over weakness   Current Outpatient Medications on File Prior to Visit  Medication Sig Dispense Refill  . Albuterol Sulfate 108 (90 Base) MCG/ACT AEPB Inhale 2 puffs into the lungs every 6 (six) hours as needed. (Patient taking differently: Inhale 2 puffs into the lungs every 6 (six) hours as needed (shortness of breath). ) 1 each 5  . amLODipine (NORVASC) 10 MG tablet TAKE  1 TABLET BY MOUTH  DAILY 90 tablet 1  . amoxicillin-clavulanate (AUGMENTIN) 875-125 MG tablet Take 1 tablet by mouth 2 (two) times daily for 14 days. 28 tablet 0  . aspirin 81 MG EC tablet Take 81 mg by mouth daily.      . benazepril (LOTENSIN) 40 MG tablet TAKE 1 TABLET BY MOUTH  DAILY 90 tablet 0  . Blood Glucose Monitoring Suppl (ONE TOUCH ULTRA 2) w/Device KIT Use as directed E11.9 1 each 0  . furosemide (LASIX) 40 MG tablet TAKE 1 TO 2 TABLETS DAILY AS NEEDED FOR WEIGHT GAIN FOR 3 LBS OR MORE. (Patient taking differently: Take 20-80 mg by mouth daily as needed for edema. ) 180 tablet 3  . glucose blood (ONE TOUCH ULTRA TEST) test strip Use as instructed once per day E11.9 100 each 12  . HYDROcodone-acetaminophen (NORCO) 5-325 MG tablet Take 1-2 tablets by mouth every 4 (four) hours as needed for moderate pain. 20 tablet 0  . loratadine (CLARITIN) 10 MG tablet Take 10 mg by mouth daily as needed for allergies.    . metFORMIN (GLUCOPHAGE-XR) 500 MG 24 hr tablet Take 1 tablet (500 mg total) by mouth daily with breakfast. 90 tablet 3  . metoprolol succinate (TOPROL-XL) 100 MG 24 hr tablet Take 1 tablet (100 mg total) by mouth daily. 90 tablet 3  . omeprazole (PRILOSEC) 20 MG capsule Take 1 capsule (20 mg total) by mouth daily. 90 capsule 3  . ONETOUCH DELICA LANCETS 03B MISC USE 1 TIME PER DAY 100 each 2  . potassium chloride SA (KLOR-CON M20) 20 MEQ tablet Take 1 to 2 tablets as needed for weight gain  Of greater than 3 lbs.or more (Patient taking differently: Take 20 mEq by mouth See admin instructions. Take 20 meq by mouth daily when taking 80 mg of furosemide.  Take 20 meq every other day if taking 20 or 40 mg of furosemide daily) 180 tablet 3  . pravastatin (PRAVACHOL) 40 MG tablet Take 1 tablet (40 mg total) by mouth daily. 90 tablet 1  . spironolactone (ALDACTONE) 25 MG tablet TAKE 1 TABLET BY MOUTH  DAILY 90 tablet 1  . XARELTO 20 MG TABS tablet TAKE 1 TABLET BY MOUTH  DAILY WITH SUPPER 90  tablet 1   No current facility-administered medications on file prior to visit.    Review of Systems  Constitutional: Negative for other unusual diaphoresis or sweats HENT: Negative for ear discharge or swelling Eyes: Negative for other worsening visual disturbances Respiratory: Negative for stridor or other swelling  Gastrointestinal: Negative for worsening distension or other blood Genitourinary: Negative for retention or other urinary change Musculoskeletal: Negative for other MSK pain or swelling Skin: Negative for color change or other new lesions Neurological: Negative for worsening tremors and other numbness  Psychiatric/Behavioral: Negative for worsening agitation or other fatigue  All other system neg per pt    Objective:   Physical Exam BP (!) 154/82   Pulse 100   Temp 98.4 F (36.9 C) (Oral)   Ht 6' 1" (1.854 m)   Wt 266 lb (120.7 kg)   SpO2 97%   BMI 35.09 kg/m  VS noted,  Constitutional: Pt appears in NAD HENT: Head: NCAT.  Right Ear: External ear normal.  Left Ear: External ear normal.  Eyes: . Pupils are equal, round, and reactive to light. Conjunctivae and EOM are normal Nose: without d/c or deformity Neck: Neck supple. Gross normal ROM Cardiovascular: Normal rate and regular rhythm.   Pulmonary/Chest: Effort normal and breath sounds without rales or wheezing.  Abd:  Soft, NT, ND, + BS, no organomegaly Neurological: Pt is alert. At baseline orientation, motor grossly intact Skin: Skin is warm. No rashes, other new lesions, trace to 1+ bilat LE edema Psychiatric: Pt behavior is normal without agitation  No other exam findings  Lab Results  Component Value Date   WBC 7.7 02/25/2018   HGB 9.7 (L) 02/25/2018   HCT 28.9 (L) 02/25/2018   PLT 359 02/25/2018   GLUCOSE 104 (H) 02/25/2018   CHOL 138 07/29/2017   TRIG 63.0 07/29/2017   HDL 32.30 (L) 07/29/2017   LDLCALC 93 07/29/2017   ALT 16 02/25/2018   AST 22 02/25/2018   NA 138 02/25/2018   K 4.1  02/25/2018   CL 106 02/25/2018   CREATININE 1.11 02/25/2018   BUN 15 02/25/2018   CO2 24 02/25/2018   TSH 1.00 07/29/2017   PSA 0.09 (L) 07/29/2017   INR 1.63 (H) 08/26/2010   HGBA1C 6.5 (H) 02/10/2018   MICROALBUR 1.4 08/27/2009       Assessment & Plan:

## 2018-03-01 NOTE — Assessment & Plan Note (Signed)
Chronic overall mild worsening LE edema but possible element of venous insuffic as well, cont same tx

## 2018-03-01 NOTE — Assessment & Plan Note (Signed)
stable overall by history and exam, recent data reviewed with pt, and pt to continue medical treatment as before,  to f/u any worsening symptoms or concerns  

## 2018-03-01 NOTE — Assessment & Plan Note (Signed)
For f/u cbc today

## 2018-03-01 NOTE — Assessment & Plan Note (Signed)
Did have very large clotted blood mass extruded from right scrotal area yest, no other overt bleeding except small on the pads,  For f/u urology tomorrow as planned

## 2018-03-02 ENCOUNTER — Telehealth: Payer: Self-pay | Admitting: Cardiology

## 2018-03-02 NOTE — Telephone Encounter (Signed)
Spoke with pt. Pt sts that he has taken Ibuprofen for years for back surgery. Pt had surgery in July and was prescribed a narcotic pain medication. Patient sts that he does not like to take narcotic meds and prefers to take something over the counter. During his recent hospitalization he was told that he should not take Ibuprofen while taking Xarelto.  Adv pt that taking Ibuprofen while on Xarelto can increase his risk of bleeding. Tylenol would be recommended, adv pt not to exceed the recommended daily dosage. Adv pt to f/u with his pcp or the surgeon if the pain is not controlled.  Pt voiced appreciation for the call back and verbalized understanding.

## 2018-03-02 NOTE — Telephone Encounter (Signed)
New Message   Pt states he has questions about a medication he is suppose to take after the surgery he had. Please call

## 2018-03-07 ENCOUNTER — Other Ambulatory Visit: Payer: Self-pay | Admitting: Cardiology

## 2018-03-08 NOTE — Telephone Encounter (Signed)
Rx sent to pharmacy   

## 2018-03-11 ENCOUNTER — Encounter: Payer: Self-pay | Admitting: Cardiology

## 2018-03-11 ENCOUNTER — Ambulatory Visit: Payer: Medicare Other | Admitting: Cardiology

## 2018-03-11 VITALS — BP 159/78 | HR 77 | Ht 73.0 in | Wt 258.6 lb

## 2018-03-11 DIAGNOSIS — M7989 Other specified soft tissue disorders: Secondary | ICD-10-CM | POA: Diagnosis not present

## 2018-03-11 DIAGNOSIS — I480 Paroxysmal atrial fibrillation: Secondary | ICD-10-CM | POA: Diagnosis not present

## 2018-03-11 DIAGNOSIS — I110 Hypertensive heart disease with heart failure: Secondary | ICD-10-CM | POA: Diagnosis not present

## 2018-03-11 DIAGNOSIS — Z79899 Other long term (current) drug therapy: Secondary | ICD-10-CM

## 2018-03-11 DIAGNOSIS — E785 Hyperlipidemia, unspecified: Secondary | ICD-10-CM | POA: Diagnosis not present

## 2018-03-11 DIAGNOSIS — E669 Obesity, unspecified: Secondary | ICD-10-CM

## 2018-03-11 DIAGNOSIS — I5032 Chronic diastolic (congestive) heart failure: Secondary | ICD-10-CM

## 2018-03-11 MED ORDER — SPIRONOLACTONE 50 MG PO TABS: 50.0000 mg | ORAL_TABLET | Freq: Every day | ORAL | 3 refills | Status: DC

## 2018-03-11 NOTE — Patient Instructions (Addendum)
MEDICATION INSTRUCTIONS  STOP ASPIRIN   MAY TAKE ADVIL AND/OR TYLENOL ( CAN ALTERNATE IN THE SAME DAY ) BUT DUE NOT TAKE MORE THAN 1600 MG IN 24 HOUR PERIOD.   INCREASE SPIRONOLACTONE TO 50 MG  DAILY   ( CALL OFFICE BACK IN PRESCRIPTION NEED TO BE SENT TO MAIL ORDER.)    LABS IN 2 WEEKS AFTER THE INCREASE IN SPIROLACTONE BMP    Your physician wants you to follow-up in [place] 2019 [place]. If you need a refill on your cardiac medications before your next appointment, please call your pharmacy.

## 2018-03-11 NOTE — Progress Notes (Signed)
PCP: Biagio Borg, MD  Clinic Note: Chief Complaint  Patient presents with  . Hospitalization Follow-up    Pt states no Sx or concerns.   . Atrial Fibrillation  . Congestive Heart Failure    HFpEF    HPI: [name] is a 72 y.o. male with a PMH below who presents today for 5 month & post-hospital f/u PAF & edema.  [name] was last seen on September 20, 2017 - doing OK.  Upset about new Rx of DM.  Working on diet change.  Still R>L edema.  Limited by back pain re: exercise.  Uses cane.  No sense of Afib. No change in baseline dyspnea.  Recent Hospitalizations:   02/15/18 - OP Sgx - removal of Penile Prosthesis  02/26/18 - Brief admission for oozing from surgical site - noted to have increased LE Edema - (Called Acute on Chronic CHF??) -given a few doses of IV diuretics.   Studies Personally Reviewed - (if available, images/films reviewed: From Epic Chart or Care Everywhere)  TTE 02/25/18: Mod LVH. EF 65-70%. No RWMA.  Unable to assess DF 2/2 Afib. Mild MR. Severe BiAtrial Enlargement. High CVP.  Interval History: [name] returns now for post hospital follow-up stating that his edema is pretty well controlled now after having been given IV Lasix.  He is on higher dose of oral Lasix at home.  He says he is back to now walking again with following his surgery.  He still has soreness from surgical site. He still remains astigmatic from his A. fib.  He denies any PND orthopnea, but does have some exertional dyspnea which is pretty much the same as it has always been. Besides having some mild oozing from the surgical site, no melena, hematochezia, hematuria or epistaxis.  No claudication.  No syncope/near syncope or TIA/amaurosis fugax.  ROS: A comprehensive was performed. Review of Systems  Constitutional: Positive for malaise/fatigue (getting energy back from post-op recovery) and weight loss.  HENT: Negative for congestion and nosebleeds.   Respiratory: Positive for  shortness of breath (baseline). Negative for cough and wheezing.   Cardiovascular: Positive for leg swelling (better).  Genitourinary:       Still has pain & swelling in the surgical site - penis; not much bleeding now  Musculoskeletal: Positive for joint pain. Negative for falls.  Neurological: Positive for dizziness (rarely).  Psychiatric/Behavioral: Negative for memory loss. The patient does not have insomnia.   All other systems reviewed and are negative.  I have reviewed and (if needed) personally updated the patient's problem list, medications, allergies, past medical and surgical history, social and family history.   Past Medical History:  Diagnosis Date  . Adenoma 05/2008  . Alcoholism in recovery ([place])   . BPH (benign prostatic hyperplasia)   . Cancer [facility])    Prostate  . Chronic LBP    Hip & Back -- Sees Dr. Maia Petties @ [place]  . Colon polyps   . Complex renal cyst 12/2010  . Controlled type 2 diabetes mellitus with neuropathy ([place]) 01/2007  . COPD (chronic obstructive pulmonary disease) ([place])   . Diabetes ([place])   . Diverticulitis of colon   . ED (erectile dysfunction)    s/p Penile prosthesis (09/2010)  . History of prostate cancer    Dr. Karsten Ro  . History of sick sinus syndrome    reduced BB dose for Bradycardia  . HLD (hyperlipidemia)   . Hypertension   . Impaired glucose tolerance 12/21/2013  .  Nephrolithiasis   . Osteoarthritis of both hips   . Osteoarthritis of lumbosacral spine    with Disc Disease  . PAF (paroxysmal atrial fibrillation) (H[facility])    No longer on Amiodarone.  Not on Anticoaguation b/c no recurrence.  . Paresthesias/numbness    Bilateral LE  . Peripheral neuropathy 12/21/2013    Past Surgical History:  Procedure Laterality Date  . CARDIAC CATHETERIZATION  2003   Normal Coronary Arteries.  Marland Kitchen NM MYOVIEW LTD  03/2016   No Ischemia or Infarct - Visual EF ~60% (computer EF ~39%) - by Echo 55-60%  . PENILE PROSTHESIS IMPLANT   06/21/2012   Procedure: PENILE PROTHESIS INFLATABLE;  Surgeon: Claybon Jabs, MD;  Location[facility];  Service: Urology;  Laterality: N/A;  REMOVAL AND REPLACEMENT OF SOME  OF PROSTHESIS (AMS)   . PENILE PROSTHESIS PLACEMENT  09/2010  . PROSTATECTOMY    . REMOVAL OF PENILE PROSTHESIS N/A 02/15/2018   Procedure: REMOVAL OF PENILE PROSTHESIS;  Surgeon: Kathie Rhodes, MD;  Location: [facility];  Service: Urology;  Laterality: N/A;  . TRANSTHORACIC ECHOCARDIOGRAM  04/2016   a) Relatively normal EF of 55-60%. No RWMA suggesting no prior MI. Gr 2 DD (pseudo-normal filling pattern) along with moderately dilated left atrium.;; b) 02/2018: Mod LVH. EF 65-70%. No RWMA.  Unable to assess DF 2/2 Afib. Mild MR. Severe BiAtrial Enlargement. High CVP.    Current Meds  Medication Sig  . Albuterol Sulfate 108 (90 Base) MCG/ACT AEPB Inhale 2 puffs into the lungs every 6 (six) hours as needed. (Patient taking differently: Inhale 2 puffs into the lungs every 6 (six) hours as needed (shortness of breath). )  . amLODipine (NORVASC) 10 MG tablet TAKE 1 TABLET BY MOUTH  DAILY  . benazepril (LOTENSIN) 40 MG tablet TAKE 1 TABLET BY MOUTH  DAILY  . Blood Glucose Monitoring Suppl (ONE TOUCH ULTRA 2) w/Device KIT Use as directed E11.9  . furosemide (LASIX) 40 MG tablet TAKE 1 TO 2 TABLETS DAILY AS NEEDED FOR WEIGHT GAIN FOR 3 LBS OR MORE. (Patient taking differently: Take 20-80 mg by mouth daily as needed for edema. )  . glucose blood (ONE TOUCH ULTRA TEST) test strip Use as instructed once per day E11.9  . HYDROcodone-acetaminophen (NORCO) 5-325 MG tablet Take 1-2 tablets by mouth every 4 (four) hours as needed for moderate pain.  Marland Kitchen loratadine (CLARITIN) 10 MG tablet Take 10 mg by mouth daily as needed for allergies.  . metFORMIN (GLUCOPHAGE-XR) 500 MG 24 hr tablet Take 1 tablet (500 mg total) by mouth daily with breakfast.  . metoprolol succinate (TOPROL-XL) 100 MG 24 hr tablet Take 1 tablet (100 mg total) by  mouth daily.  Marland Kitchen omeprazole (PRILOSEC) 20 MG capsule Take 1 capsule (20 mg total) by mouth daily.  Glory Rosebush DELICA LANCETS 77A MISC USE 1 TIME PER DAY  . potassium chloride SA (KLOR-CON M20) 20 MEQ tablet Take 1 to 2 tablets as needed for weight gain  Of greater than 3 lbs.or more (Patient taking differently: Take 20 mEq by mouth See admin instructions. Take 20 meq by mouth daily when taking 80 mg of furosemide.  Take 20 meq every other day if taking 20 or 40 mg of furosemide daily)  . pravastatin (PRAVACHOL) 40 MG tablet TAKE 1 TABLET BY MOUTH  DAILY  . XARELTO 20 MG TABS tablet TAKE 1 TABLET BY MOUTH  DAILY WITH SUPPER  . [DISCONTINUED] aspirin 81 MG EC tablet Take 81 mg by mouth  daily.    . [DISCONTINUED] spironolactone (ALDACTONE) 25 MG tablet TAKE 1 TABLET BY MOUTH  DAILY    Allergies  Allergen Reactions  . Ciprofloxacin Other (See Comments)    All over weakness    Social History   Tobacco Use  . Smoking status: Former Smoker    Types: Cigarettes    Last attempt to quit: 11/04/1982    Years since quitting: 35.3  . Smokeless tobacco: Never Used  Substance Use Topics  . Alcohol use: Yes    Comment: no alcohol x 1 week wants to stop  . 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   Divorced. 3 grown children. 4 GC.     Quit smoking ~20+ yrs ago.     family history includes Coronary artery disease in his father and mother; Diabetes in his father; Heart attack in his mother; Prostate cancer in his father.  Wt Readings from Last 3 Encounters:  03/11/18 258 lb 9.6 oz (117.3 kg)  03/01/18 266 lb (120.7 kg)  02/25/18 272 lb 3.2 oz (123.5 kg)    PHYSICAL EXAM BP (!) 159/78   Pulse 77   Ht '6\' 1"'  (1.854 m)   Wt 258 lb 9.6 oz (117.3 kg)   BMI 34.12 kg/m  Physical Exam  Constitutional: He is oriented to person, place, and  time. He appears well-developed and well-nourished. No distress.  Moderately obese.  Well groomed  HENT:  Head: Normocephalic and atraumatic.  Neck: Normal range of motion. Neck supple. No hepatojugular reflux and no JVD present. Carotid bruit is not present.  Cardiovascular: Normal rate, intact distal pulses and normal pulses. An irregularly irregular rhythm present.  No extrasystoles are present. PMI is not displaced. Exam reveals no gallop and no friction rub.  Murmur heard.  Low-pitched harsh crescendo-decrescendo midsystolic murmur is present with a grade of 1/6 at the upper right sternal border radiating to the neck. Pulmonary/Chest: Effort normal and breath sounds normal. No respiratory distress. He has no wheezes.  Abdominal: Soft. Bowel sounds are normal. He exhibits no distension. There is no tenderness. There is no rebound.  No HSM  Musculoskeletal: Normal range of motion. He exhibits edema (1+ BLE R>L).  Slow, wide-based gait. Somewhat shuffling with significant kyphoscoliosis   Neurological: He is alert and oriented to person, place, and time.  Psychiatric: He has a normal mood and affect. His behavior is normal. Judgment and thought content normal.  Vitals reviewed.   Adult ECG Report N/a  Other studies Reviewed: Additional studies/ records that were reviewed today include:  Recent Labs:   Lab Results  Component Value Date   CREATININE 1.11 02/25/2018   BUN 15 02/25/2018   NA 138 02/25/2018   K 4.1 02/25/2018   CL 106 02/25/2018   CO2 24 02/25/2018   Lab Results  Component Value Date   CHOL 138 07/29/2017   HDL 32.30 (L) 07/29/2017   LDLCALC 93 07/29/2017   TRIG 63.0 07/29/2017   CHOLHDL 4 07/29/2017    ASSESSMENT / PLAN: Problem List Items Addressed This Visit    Hyperlipidemia with target LDL less than 100 (Chronic)    Remains on pravastatin.  Stable      Relevant Medications   spironolactone (ALDACTONE) 50 MG tablet   Hypertensive heart disease with  chronic diastolic congestive  heart failure ([place]) - Primary (Chronic)    Heart failure with preserved EF.  He has had chronic lower from edema which I think is probably as much related to venous insufficiency as it is related to diastolic dysfunction she has no PND, orthopnea or abdominal girth to suggest that the edema is related to left heart failure.  His edema does seem to be better on higher dose of Lasix, and when his hypertension, I will increase his spironolactone to 50 mg daily.  We will maintain Lasix 40 mg twice daily with PRN dosing. --He has had issues with hypokalemia and is still on potassium supplementation with additional dose of Lasix.   We will recheck chemistry panel with this change to ensure stable potassium levels.      Relevant Medications   spironolactone (ALDACTONE) 50 MG tablet   Other Relevant Orders   Basic metabolic panel   Medication management   Relevant Orders   Basic metabolic panel   Obesity (BMI 30-39.9) (Chronic)    Doing well with weight loss.  Concerted effort after being diagnosed with diabetes.      PAF (paroxysmal atrial fibrillation) - now off amiodarone; asymptomatic. CHA2DS2-VASc Score -5 (on Xarelto) (Chronic)    Still in afib - rate controlled on Toprol. No major bleeding on Xarelto.  This patients CHA2DS2-VASc Score and unadjusted Ischemic Stroke Rate (% per year) is equal to 7.2 % stroke rate/year from a score of 5  Above score calculated as 1 point each if present [CHF, HTN, DM, Vascular=MI/PAD/Aortic Plaque, Age if 65-74, or Male] Above score calculated as 2 points each if present [Age > 75, or Stroke/TIA/TE]        Relevant Medications   spironolactone (ALDACTONE) 50 MG tablet   Swelling of lower extremity (Chronic)    Not sure if this is more related to venous insufficiency versus heart failure.  Continue current dose Lasix.        Relevant Orders   Basic metabolic panel      I spent a total of 30 minutes with the patient  and chart review. >  50% of the time was spent in direct patient consultation.   Current medicines are reviewed at length with the patient today.  (+/- concerns) n/a The following changes have been made:  see below  Patient Instructions  MEDICATION INSTRUCTIONS  STOP ASPIRIN   MAY TAKE ADVIL AND/OR TYLENOL ( CAN ALTERNATE IN THE SAME DAY ) BUT DUE NOT TAKE MORE THAN 1600 MG IN 24 HOUR PERIOD.   INCREASE SPIRONOLACTONE TO 50 MG  DAILY   ( CALL OFFICE BACK IN PRESCRIPTION NEED TO BE SENT TO MAIL ORDER.)    LABS IN 2 WEEKS AFTER THE INCREASE IN SPIROLACTONE BMP    Your physician wants you to follow-up in [place] 2019 [place]. If you need a refill on your cardiac medications before your next appointment, please call your pharmacy.     Studies Ordered:   Orders Placed This Encounter  Procedures  . Basic metabolic panel      Glenetta Hew, M.D., M.S. Interventional Cardiologist   Pager # [phone] Phone # [phone] [address] [place], [place] [address]   Thank you for choosing [facility] at [facility]!!

## 2018-03-12 ENCOUNTER — Telehealth: Payer: Self-pay | Admitting: Cardiology

## 2018-03-12 NOTE — Telephone Encounter (Signed)
Pt c/o medication issue:  1. Name of Medication: ALL  2. How are you currently taking this medication (dosage and times per day) PER PT CALL HAS SOME QUESTIONS  3. Are you having a reaction (difficulty breathing--STAT)? NO  4. What is your medication issue? ALL PT WANT TO CONFIRM SOME THINGS PLEASE GIVE HIM A CALL BACK.

## 2018-03-12 NOTE — Telephone Encounter (Signed)
Returned call to patient he stated he wanted to confirm his medication instructions from Dr.[name]'s office visit yesterday.Advised he is to stop aspirin,increase spironolactone 50 mg daily.May alternate advil and tylenol if needed for pain.Patient voiced understanding.

## 2018-03-14 ENCOUNTER — Encounter: Payer: Self-pay | Admitting: Cardiology

## 2018-03-14 NOTE — Assessment & Plan Note (Signed)
Still in afib - rate controlled on Toprol. No major bleeding on Xarelto.  This patients CHA2DS2-VASc Score and unadjusted Ischemic Stroke Rate (% per year) is equal to 7.2 % stroke rate/year from a score of 5  Above score calculated as 1 point each if present [CHF, HTN, DM, Vascular=MI/PAD/Aortic Plaque, Age if 65-74, or Male] Above score calculated as 2 points each if present [Age > 75, or Stroke/TIA/TE]

## 2018-03-14 NOTE — Assessment & Plan Note (Signed)
Doing well with weight loss.  Concerted effort after being diagnosed with diabetes.

## 2018-03-14 NOTE — Assessment & Plan Note (Signed)
Not sure if this is more related to venous insufficiency versus heart failure.  Continue current dose Lasix.

## 2018-03-14 NOTE — Assessment & Plan Note (Signed)
Heart failure with preserved EF.  He has had chronic lower from edema which I think is probably as much related to venous insufficiency as it is related to diastolic dysfunction she has no PND, orthopnea or abdominal girth to suggest that the edema is related to left heart failure.  His edema does seem to be better on higher dose of Lasix, and when his hypertension, I will increase his spironolactone to 50 mg daily.  We will maintain Lasix 40 mg twice daily with PRN dosing. --He has had issues with hypokalemia and is still on potassium supplementation with additional dose of Lasix.   We will recheck chemistry panel with this change to ensure stable potassium levels.

## 2018-03-14 NOTE — Assessment & Plan Note (Signed)
Remains on pravastatin.  Stable

## 2018-03-15 ENCOUNTER — Ambulatory Visit: Payer: Medicare Other | Admitting: Internal Medicine

## 2018-03-30 DIAGNOSIS — I110 Hypertensive heart disease with heart failure: Secondary | ICD-10-CM | POA: Diagnosis not present

## 2018-03-30 DIAGNOSIS — I5032 Chronic diastolic (congestive) heart failure: Secondary | ICD-10-CM | POA: Diagnosis not present

## 2018-03-30 DIAGNOSIS — M7989 Other specified soft tissue disorders: Secondary | ICD-10-CM | POA: Diagnosis not present

## 2018-03-30 DIAGNOSIS — Z79899 Other long term (current) drug therapy: Secondary | ICD-10-CM | POA: Diagnosis not present

## 2018-03-31 LAB — BASIC METABOLIC PANEL
BUN/Creatinine Ratio: 17 (ref 10–24)
BUN: 23 mg/dL (ref 8–27)
CO2: 17 mmol/L — ABNORMAL LOW (ref 20–29)
Calcium: 9 mg/dL (ref 8.6–10.2)
Chloride: 100 mmol/L (ref 96–106)
Creatinine, Ser: 1.34 mg/dL — ABNORMAL HIGH (ref 0.76–1.27)
GFR calc Af Amer: 61 mL/min/{1.73_m2} (ref >59)
GFR calc non Af Amer: 53 mL/min/{1.73_m2} — ABNORMAL LOW (ref >59)
Glucose: 95 mg/dL (ref 65–99)
Potassium: 4.4 mmol/L (ref 3.5–5.2)
Sodium: 134 mmol/L (ref 134–144)

## 2018-04-05 ENCOUNTER — Telehealth: Payer: Self-pay | Admitting: Cardiology

## 2018-04-05 DIAGNOSIS — I110 Hypertensive heart disease with heart failure: Secondary | ICD-10-CM

## 2018-04-05 DIAGNOSIS — I5032 Chronic diastolic (congestive) heart failure: Secondary | ICD-10-CM

## 2018-04-05 DIAGNOSIS — Z79899 Other long term (current) drug therapy: Secondary | ICD-10-CM

## 2018-04-05 NOTE — Telephone Encounter (Signed)
Notes recorded by Leonie Man, MD on 04/04/2018 at 9:38 PM EDT To panel: Kidney function shows that he may be a little dry, but probably is still within the realm of lab error. Potassium is stable. I would like to reassess basic chemistry panel in a 2 weeks.  Ensure that he is drinking enough. 8-10 glasses of water a day.  Glenetta Hew, MD   Spoke to patient, aware and verbalized understanding.  Repeat lab order placed.

## 2018-04-05 NOTE — Telephone Encounter (Signed)
New message   Patient is calling to get lab results. He specifically wants to get his potassium results.

## 2018-04-13 ENCOUNTER — Other Ambulatory Visit: Payer: Self-pay | Admitting: Internal Medicine

## 2018-04-14 DIAGNOSIS — Z79899 Other long term (current) drug therapy: Secondary | ICD-10-CM | POA: Diagnosis not present

## 2018-04-14 DIAGNOSIS — I110 Hypertensive heart disease with heart failure: Secondary | ICD-10-CM | POA: Diagnosis not present

## 2018-04-14 DIAGNOSIS — I5032 Chronic diastolic (congestive) heart failure: Secondary | ICD-10-CM | POA: Diagnosis not present

## 2018-04-15 LAB — COMPREHENSIVE METABOLIC PANEL
ALT: 46 IU/L — ABNORMAL HIGH (ref 0–44)
AST: 41 IU/L — ABNORMAL HIGH (ref 0–40)
Albumin/Globulin Ratio: 0.8 — ABNORMAL LOW (ref 1.2–2.2)
Albumin: 3.8 g/dL (ref 3.5–4.8)
Alkaline Phosphatase: 210 IU/L — ABNORMAL HIGH (ref 39–117)
BUN/Creatinine Ratio: 17 (ref 10–24)
BUN: 23 mg/dL (ref 8–27)
Bilirubin Total: 0.5 mg/dL (ref 0.0–1.2)
CO2: 17 mmol/L — ABNORMAL LOW (ref 20–29)
Calcium: 9.4 mg/dL (ref 8.6–10.2)
Chloride: 105 mmol/L (ref 96–106)
Creatinine, Ser: 1.33 mg/dL — ABNORMAL HIGH (ref 0.76–1.27)
GFR calc Af Amer: 62 mL/min/{1.73_m2} (ref >59)
GFR calc non Af Amer: 53 mL/min/{1.73_m2} — ABNORMAL LOW (ref >59)
Globulin, Total: 4.5 g/dL (ref 1.5–4.5)
Glucose: 107 mg/dL — ABNORMAL HIGH (ref 65–99)
Potassium: 4.8 mmol/L (ref 3.5–5.2)
Sodium: 138 mmol/L (ref 134–144)
Total Protein: 8.3 g/dL (ref 6.0–8.5)

## 2018-04-27 ENCOUNTER — Encounter: Payer: Self-pay | Admitting: Internal Medicine

## 2018-04-27 ENCOUNTER — Ambulatory Visit (INDEPENDENT_AMBULATORY_CARE_PROVIDER_SITE_OTHER): Payer: Medicare Other | Admitting: Internal Medicine

## 2018-04-27 ENCOUNTER — Other Ambulatory Visit (INDEPENDENT_AMBULATORY_CARE_PROVIDER_SITE_OTHER): Payer: Medicare Other

## 2018-04-27 VITALS — BP 136/84 | HR 61 | Temp 97.9°F | Ht 73.0 in | Wt 263.0 lb

## 2018-04-27 DIAGNOSIS — R945 Abnormal results of liver function studies: Secondary | ICD-10-CM | POA: Diagnosis not present

## 2018-04-27 DIAGNOSIS — R7302 Impaired glucose tolerance (oral): Secondary | ICD-10-CM | POA: Diagnosis not present

## 2018-04-27 DIAGNOSIS — R7989 Other specified abnormal findings of blood chemistry: Secondary | ICD-10-CM

## 2018-04-27 DIAGNOSIS — Z23 Encounter for immunization: Secondary | ICD-10-CM

## 2018-04-27 DIAGNOSIS — I5032 Chronic diastolic (congestive) heart failure: Secondary | ICD-10-CM | POA: Diagnosis not present

## 2018-04-27 DIAGNOSIS — I110 Hypertensive heart disease with heart failure: Secondary | ICD-10-CM

## 2018-04-27 LAB — HEPATIC FUNCTION PANEL
ALT: 18 U/L (ref 0–53)
AST: 22 U/L (ref 0–37)
Albumin: 3.8 g/dL (ref 3.5–5.2)
Alkaline Phosphatase: 132 U/L — ABNORMAL HIGH (ref 39–117)
Bilirubin, Direct: 0 mg/dL (ref 0.0–0.3)
Total Bilirubin: 0.4 mg/dL (ref 0.2–1.2)
Total Protein: 9.3 g/dL — ABNORMAL HIGH (ref 6.0–8.3)

## 2018-04-27 NOTE — Assessment & Plan Note (Signed)
stable overall by history and exam, recent data reviewed with pt, and pt to continue medical treatment as before,  to f/u any worsening symptoms or concerns  

## 2018-04-27 NOTE — Progress Notes (Signed)
Subjective:    Patient ID: [name], male    DOB: October 07, 1945, 72 y.o.   MRN: [id number]  HPI  Here to f/u recent mild elevated LFTs; Denies worsening reflux, abd pain, dysphagia, n/v, bowel change or blood.  Pt denies chest pain, increased sob or doe, wheezing, orthopnea, PND, increased LE swelling, palpitations, dizziness or syncope.  Pt denies new neurological symptoms such as new headache, or facial or extremity weakness or numbness   Pt denies polydipsia, polyuria  Peak wt has been about 280 in the past , about 1 yr ago.  Wt Readings from Last 3 Encounters:  04/27/18 263 lb (119.3 kg)  03/11/18 258 lb 9.6 oz (117.3 kg)  03/01/18 266 lb (120.7 kg)   Past Medical History:  Diagnosis Date  . Adenoma 05/2008  . Alcoholism in recovery ([place])   . BPH (benign prostatic hyperplasia)   . Cancer [facility])    Prostate  . Chronic LBP    Hip & Back -- Sees Dr. Maia Petties @ [place]  . Colon polyps   . Complex renal cyst 12/2010  . Controlled type 2 diabetes mellitus with neuropathy ([place]) 01/2007  . COPD (chronic obstructive pulmonary disease) ([place])   . Diabetes ([place])   . Diverticulitis of colon   . ED (erectile dysfunction)    s/p Penile prosthesis (09/2010)  . History of prostate cancer    Dr. Karsten Ro  . History of sick sinus syndrome    reduced BB dose for Bradycardia  . HLD (hyperlipidemia)   . Hypertension   . Impaired glucose tolerance 12/21/2013  . Nephrolithiasis   . Osteoarthritis of both hips   . Osteoarthritis of lumbosacral spine    with Disc Disease  . PAF (paroxysmal atrial fibrillation) ([facility])    No longer on Amiodarone.  Not on Anticoaguation b/c no recurrence.  . Paresthesias/numbness    Bilateral LE  . Peripheral neuropathy 12/21/2013   Past Surgical History:  Procedure Laterality Date  . CARDIAC CATHETERIZATION  2003   Normal Coronary Arteries.  Marland Kitchen NM MYOVIEW LTD  03/2016   No Ischemia or Infarct - Visual EF ~60% (computer EF ~39%) - by Echo  55-60%  . PENILE PROSTHESIS IMPLANT  06/21/2012   Procedure: PENILE PROTHESIS INFLATABLE;  Surgeon: Claybon Jabs, MD;  Location[facility];  Service: Urology;  Laterality: N/A;  REMOVAL AND REPLACEMENT OF SOME  OF PROSTHESIS ([facility])   . PENILE PROSTHESIS PLACEMENT  09/2010  . PROSTATECTOMY    . REMOVAL OF PENILE PROSTHESIS N/A 02/15/2018   Procedure: REMOVAL OF PENILE PROSTHESIS;  Surgeon: Kathie Rhodes, MD;  Location: [facility];  Service: Urology;  Laterality: N/A;  . TRANSTHORACIC ECHOCARDIOGRAM  04/2016   a) Relatively normal EF of 55-60%. No RWMA suggesting no prior MI. Gr 2 DD (pseudo-normal filling pattern) along with moderately dilated left atrium.;; b) 02/2018: Mod LVH. EF 65-70%. No RWMA.  Unable to assess DF 2/2 Afib. Mild MR. Severe BiAtrial Enlargement. High CVP.    reports that he quit smoking about 35 years ago. His smoking use included cigarettes. He has never used smokeless tobacco. He reports that he drinks alcohol. He reports that he does not use drugs. family history includes Coronary artery disease in his father and mother; Diabetes in his father; Heart attack in his mother; Prostate cancer in his father. Allergies  Allergen Reactions  . Ciprofloxacin Other (See Comments)    All over weakness   Current Outpatient Medications on File Prior  to Visit  Medication Sig Dispense Refill  . Albuterol Sulfate 108 (90 Base) MCG/ACT AEPB Inhale 2 puffs into the lungs every 6 (six) hours as needed. (Patient taking differently: Inhale 2 puffs into the lungs every 6 (six) hours as needed (shortness of breath). ) 1 each 5  . amLODipine (NORVASC) 10 MG tablet TAKE 1 TABLET BY MOUTH  DAILY 90 tablet 1  . benazepril (LOTENSIN) 40 MG tablet TAKE 1 TABLET BY MOUTH  DAILY 90 tablet 0  . Blood Glucose Monitoring Suppl (ONE TOUCH ULTRA 2) w/Device KIT Use as directed E11.9 1 each 0  . furosemide (LASIX) 40 MG tablet TAKE 1 TO 2 TABLETS DAILY AS NEEDED FOR WEIGHT GAIN FOR 3 LBS OR MORE.  (Patient taking differently: Take 20-80 mg by mouth daily as needed for edema. ) 180 tablet 3  . glucose blood (ONE TOUCH ULTRA TEST) test strip Use as instructed once per day E11.9 100 each 12  . HYDROcodone-acetaminophen (NORCO) 5-325 MG tablet Take 1-2 tablets by mouth every 4 (four) hours as needed for moderate pain. 20 tablet 0  . loratadine (CLARITIN) 10 MG tablet Take 10 mg by mouth daily as needed for allergies.    . metFORMIN (GLUCOPHAGE-XR) 500 MG 24 hr tablet TAKE 1 TABLET BY MOUTH  DAILY WITH BREAKFAST 90 tablet 1  . metoprolol succinate (TOPROL-XL) 100 MG 24 hr tablet Take 1 tablet (100 mg total) by mouth daily. 90 tablet 3  . omeprazole (PRILOSEC) 20 MG capsule Take 1 capsule (20 mg total) by mouth daily. 90 capsule 3  . ONETOUCH DELICA LANCETS 39J MISC USE 1 TIME PER DAY 100 each 2  . potassium chloride SA (KLOR-CON M20) 20 MEQ tablet Take 1 to 2 tablets as needed for weight gain  Of greater than 3 lbs.or more (Patient taking differently: Take 20 mEq by mouth See admin instructions. Take 20 meq by mouth daily when taking 80 mg of furosemide.  Take 20 meq every other day if taking 20 or 40 mg of furosemide daily) 180 tablet 3  . pravastatin (PRAVACHOL) 40 MG tablet TAKE 1 TABLET BY MOUTH  DAILY 90 tablet 0  . spironolactone (ALDACTONE) 50 MG tablet Take 1 tablet (50 mg total) by mouth daily. 90 tablet 3  . XARELTO 20 MG TABS tablet TAKE 1 TABLET BY MOUTH  DAILY WITH SUPPER 90 tablet 0   No current facility-administered medications on file prior to visit.    Review of Systems  Constitutional: Negative for other unusual diaphoresis or sweats HENT: Negative for ear discharge or swelling Eyes: Negative for other worsening visual disturbances Respiratory: Negative for stridor or other swelling  Gastrointestinal: Negative for worsening distension or other blood Genitourinary: Negative for retention or other urinary change Musculoskeletal: Negative for other MSK pain or swelling Skin:  Negative for color change or other new lesions Neurological: Negative for worsening tremors and other numbness  Psychiatric/Behavioral: Negative for worsening agitation or other fatigue All other system neg per pt    Objective:   Physical Exam BP 136/84   Pulse 61   Temp 97.9 F (36.6 C) (Oral)   Ht _0  (1.854 m)   Wt 263 lb (119.3 kg)   SpO2 98%   BMI 34.70 kg/m  VS noted,  Constitutional: Pt appears in NAD HENT: Head: NCAT.  Right Ear: External ear normal.  Left Ear: External ear normal.  Eyes: . Pupils are equal, round, and reactive to light. Conjunctivae and EOM are normal Nose: without  d/c or deformity Neck: Neck supple. Gross normal ROM Cardiovascular: Normal rate and regular rhythm.   Pulmonary/Chest: Effort normal and breath sounds without rales or wheezing.  Abd:  Soft, NT, ND, + BS, no organomegaly Neurological: Pt is alert. At baseline orientation, motor grossly intact Skin: Skin is warm. No rashes, other new lesions, no LE edema Psychiatric: Pt behavior is normal without agitation  No other exam findings    Assessment & Plan:

## 2018-04-27 NOTE — Assessment & Plan Note (Signed)
Differential includes med related, fatty liver, GB related, other hepatitis or viral; was minimally elevated, asympt, to cont statin for now, repeat LFT's, acute hepatitis panel and ANA, also abdomen ultrasound, to f/u any worsening symptoms or concerns

## 2018-04-27 NOTE — Patient Instructions (Signed)
Please continue all other medications as before, and refills have been done if requested.  Please have the pharmacy call with any other refills you may need.  Please keep your appointments with your specialists as you may have planned  You will be contacted regarding the referral for: Abdomen ultrasound  Please go to the LAB in the Basement (turn left off the elevator) for the tests to be done today  You will be contacted by phone if any changes need to be made immediately.  Otherwise, you will receive a letter about your results with an explanation, but please check with MyChart first.  Please remember to sign up for MyChart if you have not done so, as this will be important to you in the future with finding out test results, communicating by private email, and scheduling acute appointments online when needed.

## 2018-04-28 LAB — HEPATITIS PANEL, ACUTE
Hep A IgM: NONREACTIVE
Hep B C IgM: NONREACTIVE
Hepatitis B Surface Ag: NONREACTIVE
Hepatitis C Ab: NONREACTIVE
SIGNAL TO CUT-OFF: 0.69 (ref <1.00)

## 2018-04-28 LAB — ANA: Anti Nuclear Antibody(ANA): NEGATIVE

## 2018-05-07 ENCOUNTER — Ambulatory Visit
Admission: RE | Admit: 2018-05-07 | Discharge: 2018-05-07 | Disposition: A | Discharge status: Home / Self Care | Payer: Medicare Other | Source: Ambulatory Visit | Attending: Internal Medicine | Admitting: Internal Medicine

## 2018-05-07 ENCOUNTER — Encounter: Payer: Self-pay | Admitting: Internal Medicine

## 2018-05-07 ENCOUNTER — Other Ambulatory Visit: Payer: Self-pay | Admitting: Internal Medicine

## 2018-05-07 DIAGNOSIS — N281 Cyst of kidney, acquired: Secondary | ICD-10-CM | POA: Diagnosis not present

## 2018-05-07 DIAGNOSIS — N2889 Other specified disorders of kidney and ureter: Secondary | ICD-10-CM

## 2018-05-07 DIAGNOSIS — R945 Abnormal results of liver function studies: Secondary | ICD-10-CM

## 2018-05-07 DIAGNOSIS — R7989 Other specified abnormal findings of blood chemistry: Secondary | ICD-10-CM

## 2018-05-10 ENCOUNTER — Telehealth: Payer: Self-pay

## 2018-05-10 NOTE — Telephone Encounter (Signed)
Called pt, LVM.   CRM created.  

## 2018-05-10 NOTE — Telephone Encounter (Signed)
-----   Message from Biagio Borg, MD sent at 05/07/2018  3:50 PM EDT ----- Letter sent, cont same tx except  The test results show that your current treatment is OK, except the lower right kidney may have a mass.   To be sure, we should refer you to Urology for further consideration.  You should hear from the office soon.  Shirron to please inform pt, I will do referral

## 2018-05-19 NOTE — Telephone Encounter (Signed)
[facility]. They will contact pt - pt aware

## 2018-05-19 NOTE — Telephone Encounter (Signed)
Pt calling back. Pt does not know what is going on and urology has called him to schedule an appointment. Pt did not get voice mail. Please advise [phone]

## 2018-05-19 NOTE — Telephone Encounter (Addendum)
Spoke to pt and informed of results and the reasoning for the appointment.   Cecille Rubin- can you please follow up with patient and the urology office to get appointment set up.

## 2018-05-20 DIAGNOSIS — D49511 Neoplasm of unspecified behavior of right kidney: Secondary | ICD-10-CM | POA: Diagnosis not present

## 2018-05-23 ENCOUNTER — Other Ambulatory Visit: Payer: Self-pay | Admitting: Cardiology

## 2018-05-24 NOTE — Telephone Encounter (Signed)
Rx(s) sent to pharmacy electronically.  

## 2018-05-27 DIAGNOSIS — D49511 Neoplasm of unspecified behavior of right kidney: Secondary | ICD-10-CM | POA: Diagnosis not present

## 2018-05-27 DIAGNOSIS — N200 Calculus of kidney: Secondary | ICD-10-CM | POA: Diagnosis not present

## 2018-06-01 ENCOUNTER — Other Ambulatory Visit: Payer: Self-pay | Admitting: Cardiology

## 2018-07-01 ENCOUNTER — Ambulatory Visit: Payer: Medicare Other | Admitting: Internal Medicine

## 2018-07-04 ENCOUNTER — Other Ambulatory Visit: Payer: Self-pay | Admitting: Cardiology

## 2018-07-07 ENCOUNTER — Ambulatory Visit: Payer: Medicare Other | Admitting: Cardiology

## 2018-08-04 ENCOUNTER — Ambulatory Visit: Payer: Medicare Other | Admitting: Cardiology

## 2018-08-05 ENCOUNTER — Ambulatory Visit: Payer: Medicare Other | Admitting: Internal Medicine

## 2018-09-04 ENCOUNTER — Other Ambulatory Visit: Payer: Self-pay | Admitting: Cardiology

## 2018-10-24 ENCOUNTER — Other Ambulatory Visit: Payer: Self-pay | Admitting: Cardiology

## 2018-10-25 NOTE — Telephone Encounter (Signed)
k-dur refilled.

## 2018-10-28 ENCOUNTER — Ambulatory Visit: Payer: Medicare Other | Admitting: Cardiology

## 2018-12-06 ENCOUNTER — Other Ambulatory Visit: Payer: Self-pay

## 2018-12-06 MED ORDER — METOPROLOL SUCCINATE ER 100 MG PO TB24: 100.0000 mg | ORAL_TABLET | Freq: Every day | ORAL | 0 refills | Status: DC

## 2018-12-06 MED ORDER — OMEPRAZOLE 20 MG PO CPDR: 20.0000 mg | DELAYED_RELEASE_CAPSULE | Freq: Every day | ORAL | 0 refills | Status: DC

## 2018-12-06 MED ORDER — AMLODIPINE BESYLATE 10 MG PO TABS: 10.0000 mg | ORAL_TABLET | Freq: Every day | ORAL | 0 refills | Status: DC

## 2018-12-06 MED ORDER — RIVAROXABAN 20 MG PO TABS: ORAL_TABLET | ORAL | 1 refills | Status: DC

## 2018-12-06 NOTE — Telephone Encounter (Signed)
72yo, 263lbs, Scr 1.33 on 04/14/18, crcl 67ml/min Last OV 03/11/18 Indication Afib

## 2018-12-10 ENCOUNTER — Telehealth: Payer: Self-pay | Admitting: Internal Medicine

## 2018-12-10 MED ORDER — GLUCOSE BLOOD VI STRP: ORAL_STRIP | 12 refills | Status: DC

## 2018-12-10 MED ORDER — METFORMIN HCL ER 500 MG PO TB24: 500.0000 mg | ORAL_TABLET | Freq: Every day | ORAL | 1 refills | Status: DC

## 2018-12-10 NOTE — Telephone Encounter (Signed)
Copied from [place] [id number]. Topic: Quick Communication - Rx Refill/Question >> Dec 10, 2018  9:56 AM Mathis Bud wrote: Medication:  Jonetta Speak LANCETS 99O MISC  glucose blood (ONE TOUCH ULTRA TEST) test strip metFORMIN (GLUCOPHAGE-XR) 500 MG 24 hr tablet   Has the patient contacted their pharmacy? Yes, No more refills [facility] mail service called: Reference # [id number]  Preferred Pharmacy (with phone number or street name): [facility], [place] [phone] (Phone) [phone] (Fax)   Agent: Please be advised that RX refills may take up to 3 business days. We ask that you follow-up with your pharmacy.

## 2018-12-14 ENCOUNTER — Other Ambulatory Visit: Payer: Self-pay

## 2018-12-14 NOTE — Patient Outreach (Signed)
[facility]) Care Management  12/14/2018  Edson Zenon [birth year] [id number]   Medication Adherence call to Mr. Nykolas Seelman HIPPA Compliant Voice message left with a call back number. Mr. Codispoti is showing past due on Benazepril 40 mg and Pravastatin 40 mg under [facility].   [facility] Management Direct Dial [phone]  Fax [phone] [email or web address]

## 2018-12-23 ENCOUNTER — Other Ambulatory Visit: Payer: Self-pay

## 2018-12-23 NOTE — Patient Outreach (Signed)
[facility]) Care Management  12/23/2018  Cross Parton 06-07-1946 [id number]   Medication Adherence call to [name] [employer] Identifiers Verify spoke with patient he explain he takes his medication on a regular basis and never miss a dose and calls mail order when need it. [name] is showing past due on Benazepril 40 mg and Pravastatin 40 mg under [name].   [name] Management Direct Dial [phone]  Fax [phone] [email or web address]

## 2019-01-07 ENCOUNTER — Telehealth: Payer: Self-pay | Admitting: Cardiology

## 2019-01-07 DIAGNOSIS — Z79899 Other long term (current) drug therapy: Secondary | ICD-10-CM

## 2019-01-07 DIAGNOSIS — M7989 Other specified soft tissue disorders: Secondary | ICD-10-CM

## 2019-01-07 MED ORDER — FUROSEMIDE 40 MG PO TABS: ORAL_TABLET | ORAL | 1 refills | Status: DC

## 2019-01-07 NOTE — Telephone Encounter (Signed)
Patient is calling to report swelling in RLE - this is not new. Every now and then the swelling will go down but it'll come back up. He reports "liquid" coming out of this leg (weeping?). He is taking lasix 80mg  (has sliding scale 20-80mg ) - has been taking on a daily basis. He is monitoring salt intake. He reports his A1c has been good and BP has been good. He reports feeling "no worse than the last time he saw him [Dr. [name]]." His SOB is the same - not worse. He denies weight gain but does not weigh consistently and reports being less active since pandemic.   Advised will route to MD for advice since he has been taking max dose of lasix 80mg  and has continued swelling.  Patient is OK with virtual visit if needed - will need to be set up with daughter

## 2019-01-07 NOTE — Telephone Encounter (Signed)
New message     Pt c/o swelling: STAT is pt has developed SOB within 24 hours  1) How much weight have you gained and in what time span? No, patient states that he has not gained any weight with swelling  2) If swelling, where is the swelling located? Right lower leg   3) Are you currently taking a fluid pill? Yes   4) Are you currently SOB? No   5) Do you have a log of your daily weights (if so, list)? No   6) Have you gained 3 pounds in a day or 5 pounds in a week? No   7) Have you traveled recently? No

## 2019-01-07 NOTE — Telephone Encounter (Signed)
OK to take 80 mg in AM & additional 40mg  in PM as needed.  We should check BMP & BNP next week.  Glenetta Hew, MD

## 2019-01-07 NOTE — Telephone Encounter (Signed)
Follow up ° ° °Patient is returning your call. Please call. ° ° ° °

## 2019-01-07 NOTE — Telephone Encounter (Signed)
Patient called with MD advice & med instructions. Agreed w/plan. Labs ordered. He is hesitant about coming out d/t COVID. Reassured patient of our office safety precautions. Rx(s) sent to pharmacy electronically.

## 2019-01-07 NOTE — Telephone Encounter (Signed)
LMTCB

## 2019-02-21 ENCOUNTER — Ambulatory Visit: Payer: Self-pay

## 2019-02-21 NOTE — Telephone Encounter (Signed)
Pt was scheduled for a doxy later this week.  Please advise.

## 2019-02-21 NOTE — Telephone Encounter (Signed)
Ok for [employer] OV if able

## 2019-02-21 NOTE — Telephone Encounter (Signed)
Patient called and said that he injured his rt lower leg two weeks ago. He states that it continues to leak fluid. He states it is clear fluid without odor. He says he is unable to view the area and his daughter has been treating with antibiotic ointment and bandages. She states that it does not appear infected. He takes lasix for fluid in his lower legs. He has no weeping of fluid anywhere on either leg but this wound area. He does not have fever. He is diabetic. Care advice read to patient. Call was transferred to office for scheduling.  Reason for Disposition . [1] After 14 days AND [2] wound isn't healed  Answer Assessment - Initial Assessment Questions 1. APPEARANCE of INJURY: "What does the injury look like?"      Laceration, lower leg almost to ankle 2. SIZE: "How large is the cut?"      unsure 3. BLEEDING: "Is it bleeding now?" If so, ask: "Is it difficult to stop?"     Leaking water no bleeding 4. LOCATION: "Where is the injury located?"      Rt leg 5. ONSET: "How long ago did the injury occur?"      2 weeks ago 6. MECHANISM: "Tell me how it happened."      Bumped on bed 7. TETANUS: "When was the last tetanus booster?"     unsure 8. PREGNANCY: "Is there any chance you are pregnant?" "When was your last menstrual period?"     N/A  Protocols used: [place]

## 2019-02-21 NOTE — Telephone Encounter (Signed)
Noted  

## 2019-02-21 NOTE — Telephone Encounter (Signed)
Pt is terrified to come into office, appt scheduled for wed due to daughters work schedule.

## 2019-02-23 ENCOUNTER — Encounter: Payer: Self-pay | Admitting: Internal Medicine

## 2019-02-23 ENCOUNTER — Ambulatory Visit (INDEPENDENT_AMBULATORY_CARE_PROVIDER_SITE_OTHER): Payer: Medicare Other | Admitting: Internal Medicine

## 2019-02-23 DIAGNOSIS — I5032 Chronic diastolic (congestive) heart failure: Secondary | ICD-10-CM

## 2019-02-23 DIAGNOSIS — L97919 Non-pressure chronic ulcer of unspecified part of right lower leg with unspecified severity: Secondary | ICD-10-CM | POA: Diagnosis not present

## 2019-02-23 DIAGNOSIS — E11622 Type 2 diabetes mellitus with other skin ulcer: Secondary | ICD-10-CM | POA: Diagnosis not present

## 2019-02-23 DIAGNOSIS — I110 Hypertensive heart disease with heart failure: Secondary | ICD-10-CM

## 2019-02-23 DIAGNOSIS — E119 Type 2 diabetes mellitus without complications: Secondary | ICD-10-CM | POA: Diagnosis not present

## 2019-02-23 MED ORDER — AMOXICILLIN-POT CLAVULANATE 875-125 MG PO TABS: 1.0000 | ORAL_TABLET | Freq: Two times a day (BID) | ORAL | 0 refills | Status: DC

## 2019-02-23 NOTE — Assessment & Plan Note (Signed)
stable overall by history and exam, recent data reviewed with pt, and pt to continue medical treatment as before,  to f/u any worsening symptoms or concerns  

## 2019-02-23 NOTE — Assessment & Plan Note (Signed)
Lab Results  Component Value Date   HGBA1C 6.5 (H) 02/10/2018  due for f/u, pt declines for now, will check next visit

## 2019-02-23 NOTE — Progress Notes (Signed)
Patient ID: [name], male   DOB: August 10, 1945, 73 y.o.   MRN: [id number]  Virtual Visit via Video Note  I connected with [name] on 02/23/19 at  7:00 PM EDT by a video enabled telemedicine application and verified that I am speaking with the correct person using two identifiers.  Location: Patient: at home Provider: at office   I discussed the limitations of evaluation and management by telemedicine and the availability of in person appointments. The patient expressed understanding and agreed to proceed.  History of Present Illness: Here with c/o RLE weepy wound clearish fluid for the past mo after hit the leg on furniture, with mild worsening red, swelling surrounding; has peripheral neuropathy and little to no pain; no fever, chills, red streaks or abcess drainage.  Pt denies new neurological symptoms such as new headache, or facial or extremity weakness or numbness   Pt denies polydipsia, polyuria, CBG's recently have been in low 100's.  Has not returned to office or really gone anywhere due to concern about covid exposure.  No known sick contacts.  No cough or sob.   A friend of his had similar issue and tx at wound clinic with [facility]. Past Medical History:  Diagnosis Date  . Adenoma 05/2008  . Alcoholism in recovery ([place])   . BPH (benign prostatic hyperplasia)   . Cancer [facility])    Prostate  . Chronic LBP    Hip & Back -- Sees Dr. Maia Petties @ [place]  . Colon polyps   . Complex renal cyst 12/2010  . Controlled type 2 diabetes mellitus with neuropathy ([place]) 01/2007  . COPD (chronic obstructive pulmonary disease) ([place])   . Diabetes ([place])   . Diverticulitis of colon   . ED (erectile dysfunction)    s/p Penile prosthesis (09/2010)  . History of prostate cancer    Dr. Karsten Ro  . History of sick sinus syndrome    reduced BB dose for Bradycardia  . HLD (hyperlipidemia)   . Hypertension   . Impaired glucose tolerance 12/21/2013  . Nephrolithiasis   .  Osteoarthritis of both hips   . Osteoarthritis of lumbosacral spine    with Disc Disease  . PAF (paroxysmal atrial fibrillation) ([facility])    No longer on Amiodarone.  Not on Anticoaguation b/c no recurrence.  . Paresthesias/numbness    Bilateral LE  . Peripheral neuropathy 12/21/2013   Past Surgical History:  Procedure Laterality Date  . CARDIAC CATHETERIZATION  2003   Normal Coronary Arteries.  Marland Kitchen NM MYOVIEW LTD  03/2016   No Ischemia or Infarct - Visual EF ~60% (computer EF ~39%) - by Echo 55-60%  . PENILE PROSTHESIS IMPLANT  06/21/2012   Procedure: PENILE PROTHESIS INFLATABLE;  Surgeon: Claybon Jabs, MD;  [facility]: [facility];  Service: Urology;  Laterality: N/A;  REMOVAL AND REPLACEMENT OF SOME  OF PROSTHESIS (AMS)   . PENILE PROSTHESIS PLACEMENT  09/2010  . PROSTATECTOMY    . REMOVAL OF PENILE PROSTHESIS N/A 02/15/2018   Procedure: REMOVAL OF PENILE PROSTHESIS;  Surgeon: Kathie Rhodes, MD;  Location: [facility];  Service: Urology;  Laterality: N/A;  . TRANSTHORACIC ECHOCARDIOGRAM  04/2016   a) Relatively normal EF of 55-60%. No RWMA suggesting no prior MI. Gr 2 DD (pseudo-normal filling pattern) along with moderately dilated left atrium.;; b) 02/2018: Mod LVH. EF 65-70%. No RWMA.  Unable to assess DF 2/2 Afib. Mild MR. Severe BiAtrial Enlargement. High CVP.    reports that he quit smoking about  36 years ago. His smoking use included cigarettes. He has never used smokeless tobacco. He reports current alcohol use. He reports that he does not use drugs. family history includes Coronary artery disease in his father and mother; Diabetes in his father; Heart attack in his mother; Prostate cancer in his father. Allergies  Allergen Reactions  . Ciprofloxacin Other (See Comments)    All over weakness   Current Outpatient Medications on File Prior to Visit  Medication Sig Dispense Refill  . Albuterol Sulfate 108 (90 Base) MCG/ACT AEPB Inhale 2 puffs into the lungs every 6 (six) hours  as needed. (Patient taking differently: Inhale 2 puffs into the lungs every 6 (six) hours as needed (shortness of breath). ) 1 each 5  . amLODipine (NORVASC) 10 MG tablet Take 1 tablet (10 mg total) by mouth daily. 90 tablet 0  . benazepril (LOTENSIN) 40 MG tablet TAKE 1 TABLET BY MOUTH  DAILY 90 tablet 3  . Blood Glucose Monitoring Suppl (ONE TOUCH ULTRA 2) w/Device KIT Use as directed E11.9 1 each 0  . furosemide (LASIX) 40 MG tablet Take 2 tablets (75m) by mouth daily in the morning and an additional 1 tablet (418m in the afternoon as needed. 215 tablet 1  . glucose blood (ONE TOUCH ULTRA TEST) test strip Use as instructed once per day E11.9 100 each 12  . HYDROcodone-acetaminophen (NORCO) 5-325 MG tablet Take 1-2 tablets by mouth every 4 (four) hours as needed for moderate pain. 20 tablet 0  . loratadine (CLARITIN) 10 MG tablet Take 10 mg by mouth daily as needed for allergies.    . metFORMIN (GLUCOPHAGE-XR) 500 MG 24 hr tablet Take 1 tablet (500 mg total) by mouth daily with breakfast. 90 tablet 1  . metoprolol succinate (TOPROL-XL) 100 MG 24 hr tablet Take 1 tablet (100 mg total) by mouth daily. 90 tablet 0  . omeprazole (PRILOSEC) 20 MG capsule Take 1 capsule (20 mg total) by mouth daily. 90 capsule 0  . ONETOUCH DELICA LANCETS 3314HISC USE 1 TIME PER DAY 100 each 2  . potassium chloride SA (K-DUR,KLOR-CON) 20 MEQ tablet TAKE 1 TO 2 TABLETS BY  MOUTH DAILY AS NEEDED FOR  WEIGHT GAIN OF 3LBS OR MORE 180 tablet 3  . pravastatin (PRAVACHOL) 40 MG tablet TAKE 1 TABLET BY MOUTH  DAILY 90 tablet 3  . rivaroxaban (XARELTO) 20 MG TABS tablet TAKE 1 TABLET BY MOUTH  DAILY WITH SUPPER 90 tablet 1  . spironolactone (ALDACTONE) 50 MG tablet Take 1 tablet (50 mg total) by mouth daily. 90 tablet 3   No current facility-administered medications on file prior to visit.    Observations/Objective: Alert, NAD, appropriate mood and affect, resps normal, cn 2-12 intact, moves all 4s, RLE with non discrete  but rather large area at least 4. X 6 cm distal leg just prox to the ankle, with large clear weepiness from multiple small shallow ulcers and surrounding mild erythema Lab Results  Component Value Date   WBC 9.6 03/01/2018   HGB 11.1 (L) 03/01/2018   HCT 33.5 (L) 03/01/2018   PLT 510.0 (H) 03/01/2018   GLUCOSE 107 (H) 04/14/2018   CHOL 138 07/29/2017   TRIG 63.0 07/29/2017   HDL 32.30 (L) 07/29/2017   LDLCALC 93 07/29/2017   ALT 18 04/27/2018   AST 22 04/27/2018   NA 138 04/14/2018   K 4.8 04/14/2018   CL 105 04/14/2018   CREATININE 1.33 (H) 04/14/2018   BUN 23 04/14/2018  CO2 17 (L) 04/14/2018   TSH 1.00 07/29/2017   PSA 0.09 (L) 07/29/2017   INR 1.63 (H) 08/26/2010   HGBA1C 6.5 (H) 02/10/2018   MICROALBUR 1.4 08/27/2009   Assessment and Plan: See notes  Follow Up Instructions: See notes   I discussed the assessment and treatment plan with the patient. The patient was provided an opportunity to ask questions and all were answered. The patient agreed with the plan and demonstrated an understanding of the instructions.   The patient was advised to call back or seek an in-person evaluation if the symptoms worsen or if the condition fails to improve as anticipated.   Cathlean Cower, MD

## 2019-02-23 NOTE — Patient Instructions (Addendum)
Please take all new medication as prescribed - the antibiotic  The office will call to schedule you for repeat tele visit on Monday Aug 10  Please continue all other medications as before, and refills have been done if requested.  Please have the pharmacy call with any other refills you may need.  Please keep your appointments with your specialists as you may have planned

## 2019-02-23 NOTE — Assessment & Plan Note (Signed)
With ? Early cellulitis, for augmentin course, wound care with nonstick pads and gauze change with neosporin twice per day;  Pt declines labs or wound clinic referral for now, as he is very wary of leaving the home

## 2019-02-24 ENCOUNTER — Telehealth: Payer: Self-pay | Admitting: Internal Medicine

## 2019-02-24 NOTE — Telephone Encounter (Signed)
No that should be fine, b/c even though potassium may be in the name, it is not a potassium pill meant to replace K in a medical way

## 2019-02-24 NOTE — Telephone Encounter (Signed)
Patient would like call back in regard to the antibiotic that Dr. Jenny Reichmann placed him on.  Would like to know if there will be side effects that would interfere with his other medications.  Patient also wants to make sure he is not getting too much potassium.

## 2019-02-25 NOTE — Telephone Encounter (Signed)
Pt has been informed.

## 2019-02-28 ENCOUNTER — Encounter: Payer: Self-pay | Admitting: Internal Medicine

## 2019-02-28 ENCOUNTER — Ambulatory Visit (INDEPENDENT_AMBULATORY_CARE_PROVIDER_SITE_OTHER): Payer: Medicare Other | Admitting: Internal Medicine

## 2019-02-28 DIAGNOSIS — L97919 Non-pressure chronic ulcer of unspecified part of right lower leg with unspecified severity: Secondary | ICD-10-CM | POA: Diagnosis not present

## 2019-02-28 DIAGNOSIS — E119 Type 2 diabetes mellitus without complications: Secondary | ICD-10-CM | POA: Diagnosis not present

## 2019-02-28 DIAGNOSIS — E11622 Type 2 diabetes mellitus with other skin ulcer: Secondary | ICD-10-CM | POA: Diagnosis not present

## 2019-02-28 DIAGNOSIS — J449 Chronic obstructive pulmonary disease, unspecified: Secondary | ICD-10-CM | POA: Diagnosis not present

## 2019-02-28 NOTE — Progress Notes (Signed)
Patient ID: [name], male   DOB: 26-Feb-1946, 73 y.o.   MRN: [id number]  Virtual Visit via Video Note  I connected with [name] on 02/28/19 at  7:00 PM EDT by a video enabled telemedicine application and verified that I am speaking with the correct person using two identifiers.  Location: Patient: at home Provider: at office   I discussed the limitations of evaluation and management by telemedicine and the availability of in person appointments. The patient expressed understanding and agreed to proceed.  History of Present Illness: Here to f/u leg wound which is overall much improved it seems with antibiotic, with only mild drainage compared to before, remains nontender, denies f/c, and swelling nearly resolved; tolerating all meds, no low sugars and  Pt denies polydipsia, polyuria.  Pt denies chest pain, increased sob or doe, wheezing, orthopnea, PND, increased LE swelling, palpitations, dizziness or syncope.  No new complaints  Past Medical History:  Diagnosis Date  . Adenoma 05/2008  . Alcoholism in recovery ([place])   . BPH (benign prostatic hyperplasia)   . Cancer [facility])    Prostate  . Chronic LBP    Hip & Back -- Sees Dr. Maia Petties @ [place]  . Colon polyps   . Complex renal cyst 12/2010  . Controlled type 2 diabetes mellitus with neuropathy ([place]) 01/2007  . COPD (chronic obstructive pulmonary disease) ([place])   . Diabetes ([place])   . Diverticulitis of colon   . ED (erectile dysfunction)    s/p Penile prosthesis (09/2010)  . History of prostate cancer    Dr. Karsten Ro  . History of sick sinus syndrome    reduced BB dose for Bradycardia  . HLD (hyperlipidemia)   . Hypertension   . Impaired glucose tolerance 12/21/2013  . Nephrolithiasis   . Osteoarthritis of both hips   . Osteoarthritis of lumbosacral spine    with Disc Disease  . PAF (paroxysmal atrial fibrillation) ([facility])    No longer on Amiodarone.  Not on Anticoaguation b/c no recurrence.  .  Paresthesias/numbness    Bilateral LE  . Peripheral neuropathy 12/21/2013   Past Surgical History:  Procedure Laterality Date  . CARDIAC CATHETERIZATION  2003   Normal Coronary Arteries.  Marland Kitchen NM MYOVIEW LTD  03/2016   No Ischemia or Infarct - Visual EF ~60% (computer EF ~39%) - by Echo 55-60%  . PENILE PROSTHESIS IMPLANT  06/21/2012   Procedure: PENILE PROTHESIS INFLATABLE;  Surgeon: Claybon Jabs, MD;  [facility];  Service: Urology;  Laterality: N/A;  REMOVAL AND REPLACEMENT OF SOME  OF PROSTHESIS (AMS)   . PENILE PROSTHESIS PLACEMENT  09/2010  . PROSTATECTOMY    . REMOVAL OF PENILE PROSTHESIS N/A 02/15/2018   Procedure: REMOVAL OF PENILE PROSTHESIS;  Surgeon: Kathie Rhodes, MD;  [facility];  Service: Urology;  Laterality: N/A;  . TRANSTHORACIC ECHOCARDIOGRAM  04/2016   a) Relatively normal EF of 55-60%. No RWMA suggesting no prior MI. Gr 2 DD (pseudo-normal filling pattern) along with moderately dilated left atrium.;; b) 02/2018: Mod LVH. EF 65-70%. No RWMA.  Unable to assess DF 2/2 Afib. Mild MR. Severe BiAtrial Enlargement. High CVP.    reports that he quit smoking about 36 years ago. His smoking use included cigarettes. He has never used smokeless tobacco. He reports current alcohol use. He reports that he does not use drugs. family history includes Coronary artery disease in his father and mother; Diabetes in his father; Heart attack in his mother;  Prostate cancer in his father. Allergies  Allergen Reactions  . Ciprofloxacin Other (See Comments)    All over weakness   Current Outpatient Medications on File Prior to Visit  Medication Sig Dispense Refill  . Albuterol Sulfate 108 (90 Base) MCG/ACT AEPB Inhale 2 puffs into the lungs every 6 (six) hours as needed. (Patient taking differently: Inhale 2 puffs into the lungs every 6 (six) hours as needed (shortness of breath). ) 1 each 5  . amLODipine (NORVASC) 10 MG tablet Take 1 tablet (10 mg total) by mouth  daily. 90 tablet 0  . amoxicillin-clavulanate (AUGMENTIN) 875-125 MG tablet Take 1 tablet by mouth 2 (two) times daily. 20 tablet 0  . benazepril (LOTENSIN) 40 MG tablet TAKE 1 TABLET BY MOUTH  DAILY 90 tablet 3  . Blood Glucose Monitoring Suppl (ONE TOUCH ULTRA 2) w/Device KIT Use as directed E11.9 1 each 0  . furosemide (LASIX) 40 MG tablet Take 2 tablets (55m) by mouth daily in the morning and an additional 1 tablet (439m in the afternoon as needed. 215 tablet 1  . glucose blood (ONE TOUCH ULTRA TEST) test strip Use as instructed once per day E11.9 100 each 12  . HYDROcodone-acetaminophen (NORCO) 5-325 MG tablet Take 1-2 tablets by mouth every 4 (four) hours as needed for moderate pain. 20 tablet 0  . loratadine (CLARITIN) 10 MG tablet Take 10 mg by mouth daily as needed for allergies.    . metFORMIN (GLUCOPHAGE-XR) 500 MG 24 hr tablet Take 1 tablet (500 mg total) by mouth daily with breakfast. 90 tablet 1  . metoprolol succinate (TOPROL-XL) 100 MG 24 hr tablet Take 1 tablet (100 mg total) by mouth daily. 90 tablet 0  . omeprazole (PRILOSEC) 20 MG capsule Take 1 capsule (20 mg total) by mouth daily. 90 capsule 0  . ONETOUCH DELICA LANCETS 3303TISC USE 1 TIME PER DAY 100 each 2  . potassium chloride SA (K-DUR,KLOR-CON) 20 MEQ tablet TAKE 1 TO 2 TABLETS BY  MOUTH DAILY AS NEEDED FOR  WEIGHT GAIN OF 3LBS OR MORE 180 tablet 3  . pravastatin (PRAVACHOL) 40 MG tablet TAKE 1 TABLET BY MOUTH  DAILY 90 tablet 3  . rivaroxaban (XARELTO) 20 MG TABS tablet TAKE 1 TABLET BY MOUTH  DAILY WITH SUPPER 90 tablet 1  . spironolactone (ALDACTONE) 50 MG tablet Take 1 tablet (50 mg total) by mouth daily. 90 tablet 3   No current facility-administered medications on file prior to visit.     Observations/Objective: Alert, NAD, appropriate mood and affect, resps normal, cn 2-12 intact, moves all 4s, RLE with improved now mild swelling, mild watery yellow dc, reduced swelling, and possibly even slight healing of the  ulcer area to the distal lateral RLE Lab Results  Component Value Date   WBC 9.6 03/01/2018   HGB 11.1 (L) 03/01/2018   HCT 33.5 (L) 03/01/2018   PLT 510.0 (H) 03/01/2018   GLUCOSE 107 (H) 04/14/2018   CHOL 138 07/29/2017   TRIG 63.0 07/29/2017   HDL 32.30 (L) 07/29/2017   LDLCALC 93 07/29/2017   ALT 18 04/27/2018   AST 22 04/27/2018   NA 138 04/14/2018   K 4.8 04/14/2018   CL 105 04/14/2018   CREATININE 1.33 (H) 04/14/2018   BUN 23 04/14/2018   CO2 17 (L) 04/14/2018   TSH 1.00 07/29/2017   PSA 0.09 (L) 07/29/2017   INR 1.63 (H) 08/26/2010   HGBA1C 6.5 (H) 02/10/2018   MICROALBUR 1.4 08/27/2009   Assessment  and Plan: See notes  Follow Up Instructions: See notes   I discussed the assessment and treatment plan with the patient. The patient was provided an opportunity to ask questions and all were answered. The patient agreed with the plan and demonstrated an understanding of the instructions.   The patient was advised to call back or seek an in-person evaluation if the symptoms worsen or if the condition fails to improve as anticipated.   Cathlean Cower, MD

## 2019-02-28 NOTE — Assessment & Plan Note (Signed)
With early infection now improved, to finish antibx course, cont wound management as before, pt declines wound clinic referral again but will call if not getting better or even worsening in the next 1-2 wks

## 2019-02-28 NOTE — Patient Instructions (Signed)
Ok to finish the antibiotic and current wound management  Please continue all other medications as before, and refills have been done if requested.  Please have the pharmacy call with any other refills you may need.  Please continue your efforts at being more active, low cholesterol diet, and weight control.  Please keep your appointments with your specialists as you may have planned  Please call if you change your mind about the wound clinic referral

## 2019-02-28 NOTE — Assessment & Plan Note (Signed)
stable overall by history and exam, recent data reviewed with pt, and pt to continue medical treatment as before,  to f/u any worsening symptoms or concerns  

## 2019-03-13 ENCOUNTER — Other Ambulatory Visit: Payer: Self-pay | Admitting: Cardiology

## 2019-03-29 ENCOUNTER — Other Ambulatory Visit: Payer: Self-pay | Admitting: Cardiology

## 2019-03-29 MED ORDER — METOPROLOL SUCCINATE ER 100 MG PO TB24: 100.0000 mg | ORAL_TABLET | Freq: Every day | ORAL | 0 refills | Status: DC

## 2019-03-29 NOTE — Addendum Note (Signed)
Addended by: Raelene Bott, BRANDY L on: 03/29/2019 10:27 AM   Modules accepted: Orders

## 2019-03-29 NOTE — Telephone Encounter (Signed)
 *  STAT* If patient is at the pharmacy, call can be transferred to refill team.   1. Which medications need to be refilled? (please list name of each medication and dose if known)  metoprolol succinate (TOPROL-XL) 100 MG 24 hr tablet  2. Which pharmacy/location (including street and city if local pharmacy) is medication to be sent to? [facility]  3. Do they need a 30 day or 90 day supply? 90  Patient only has 3 days left and is also requesting a emergency supply to hold him over until the prescription comes in the mail. Please send emergency supply prescription to [place] [facility].

## 2019-03-29 NOTE — Telephone Encounter (Signed)
Requested Prescriptions   Signed Prescriptions Disp Refills  . metoprolol succinate (TOPROL-XL) 100 MG 24 hr tablet 10 tablet 0    Sig: Take 1 tablet (100 mg total) by mouth daily. *NEEDS OFFICE VISIT FOR FURTHER REFILLS*    Authorizing Provider: Leonie Man    Ordering User: Raelene Bott, BRANDY L

## 2019-03-29 NOTE — Telephone Encounter (Signed)
Requested Prescriptions   Signed Prescriptions Disp Refills  . metoprolol succinate (TOPROL-XL) 100 MG 24 hr tablet 90 tablet 0    Sig: Take 1 tablet (100 mg total) by mouth daily. *NEEDS OFFICE VISIT FOR FURTHER REFILLS*    Authorizing Provider: Leonie Man    Ordering User: NEWCOMER MCCLAIN, BRANDY L   \

## 2019-05-04 ENCOUNTER — Other Ambulatory Visit: Payer: Self-pay | Admitting: Cardiology

## 2019-05-07 ENCOUNTER — Other Ambulatory Visit: Payer: Self-pay | Admitting: Cardiology

## 2019-05-15 ENCOUNTER — Other Ambulatory Visit: Payer: Self-pay | Admitting: Internal Medicine

## 2019-05-17 ENCOUNTER — Other Ambulatory Visit: Payer: Self-pay | Admitting: Cardiology

## 2019-06-12 ENCOUNTER — Other Ambulatory Visit: Payer: Self-pay | Admitting: Cardiology

## 2019-07-11 ENCOUNTER — Other Ambulatory Visit: Payer: Self-pay

## 2019-07-13 ENCOUNTER — Telehealth: Payer: Self-pay | Admitting: Cardiology

## 2019-07-13 MED ORDER — METOPROLOL SUCCINATE ER 100 MG PO TB24: 100.0000 mg | ORAL_TABLET | Freq: Every day | ORAL | 0 refills | Status: DC

## 2019-07-13 NOTE — Telephone Encounter (Signed)
Spoke with pt who states that he received a message form [facility] stating that his Toprol-XL would not be refilled 'because the doctor would not give them the okay to refill it'. Informed pt that this may be d/t overdue appt. Pt last seen 03/11/2018. Advised pt to set up appt and nurse will send limited supply to local pharmacy of choice and Dr. Ellyn Hack can give pt 1-year supply during appt. Pt agreeable. He prefers virtual appt d/t pandemic. Pt set for 08/17/2019 at 8:40am. Refill sent to [facility] [place] - [address] [phone]

## 2019-07-13 NOTE — Telephone Encounter (Signed)
New message   Patient has questions about stop taking metoprolol succinate (TOPROL-XL) 100 MG 24 hr tablet. Please call the patient to discuss.

## 2019-07-25 ENCOUNTER — Other Ambulatory Visit: Payer: Self-pay

## 2019-07-25 ENCOUNTER — Telehealth: Payer: Self-pay | Admitting: Cardiology

## 2019-07-25 DIAGNOSIS — Z79899 Other long term (current) drug therapy: Secondary | ICD-10-CM

## 2019-07-25 DIAGNOSIS — I110 Hypertensive heart disease with heart failure: Secondary | ICD-10-CM

## 2019-07-25 DIAGNOSIS — I480 Paroxysmal atrial fibrillation: Secondary | ICD-10-CM

## 2019-07-25 DIAGNOSIS — I5032 Chronic diastolic (congestive) heart failure: Secondary | ICD-10-CM

## 2019-07-25 NOTE — Telephone Encounter (Signed)
REFILL ERROR

## 2019-07-25 NOTE — Telephone Encounter (Signed)
[place]

## 2019-07-25 NOTE — Telephone Encounter (Signed)
Pt aware needs bmet done in the next week or 2 to get further refills on Xarelto ./cy

## 2019-07-25 NOTE — Telephone Encounter (Signed)
Pt c/o medication issue:  1. Name of Medication: rivaroxaban (XARELTO) 20 MG TABS tablet  2. How are you currently taking this medication (dosage and times per day)? As directed  3. Are you having a reaction (difficulty breathing--STAT)? no  4. What is your medication issue? Patient is supposed to have labs done in order to get his 90 day supply. He wants to know how important it is for him to have labs done. He feel he is at risk for covid-19 and that is why he is doing a virtual appt with Dr. Ellyn Hack.

## 2019-07-26 ENCOUNTER — Telehealth: Payer: Self-pay | Admitting: *Deleted

## 2019-07-26 NOTE — Telephone Encounter (Signed)
Has a refill pending in the phone calls for the same medication

## 2019-07-26 NOTE — Telephone Encounter (Addendum)
Prescription refill request for Xarelto received from [place] office visit: 03/11/2018, [name] Weight: 119.3 kg  Age: 74 y.o. Scr: 1.33, 04/14/2018, via kpn  CrCl: 85 ml/min   Pt has a virtual visit scheduled with Dr [name] 08/17/2019. Will need to come in for labs.

## 2019-07-27 MED ORDER — RIVAROXABAN 20 MG PO TABS: 20.0000 mg | ORAL_TABLET | Freq: Every day | ORAL | 0 refills | Status: DC

## 2019-08-10 ENCOUNTER — Telehealth: Payer: Self-pay | Admitting: Cardiology

## 2019-08-10 NOTE — Telephone Encounter (Signed)
New Message:     Pt wants to know if Dr Ellyn Hack thinks it will be alright to take the Shingle Vaccine and would he benefit by getting it please?

## 2019-08-10 NOTE — Telephone Encounter (Signed)
Should be fine  Glenetta Hew, MD

## 2019-08-11 NOTE — Telephone Encounter (Signed)
Lm to call back ./cy 

## 2019-08-13 ENCOUNTER — Other Ambulatory Visit: Payer: Self-pay | Admitting: Cardiology

## 2019-08-17 ENCOUNTER — Telehealth (INDEPENDENT_AMBULATORY_CARE_PROVIDER_SITE_OTHER): Payer: Medicare Other | Admitting: Cardiology

## 2019-08-17 ENCOUNTER — Telehealth: Payer: Self-pay | Admitting: *Deleted

## 2019-08-17 ENCOUNTER — Encounter: Payer: Self-pay | Admitting: Cardiology

## 2019-08-17 DIAGNOSIS — E785 Hyperlipidemia, unspecified: Secondary | ICD-10-CM | POA: Diagnosis not present

## 2019-08-17 DIAGNOSIS — R0609 Other forms of dyspnea: Secondary | ICD-10-CM

## 2019-08-17 DIAGNOSIS — I110 Hypertensive heart disease with heart failure: Secondary | ICD-10-CM | POA: Diagnosis not present

## 2019-08-17 DIAGNOSIS — I480 Paroxysmal atrial fibrillation: Secondary | ICD-10-CM | POA: Diagnosis not present

## 2019-08-17 DIAGNOSIS — M7989 Other specified soft tissue disorders: Secondary | ICD-10-CM

## 2019-08-17 DIAGNOSIS — R0600 Dyspnea, unspecified: Secondary | ICD-10-CM | POA: Diagnosis not present

## 2019-08-17 DIAGNOSIS — I5032 Chronic diastolic (congestive) heart failure: Secondary | ICD-10-CM

## 2019-08-17 NOTE — Telephone Encounter (Signed)
RN spoke to patient. Instruction were given  from today's virtual visit  08/17/19 .  AVS  summary has been mailed .   Patient verbalized understanding

## 2019-08-17 NOTE — Assessment & Plan Note (Signed)
Likely combination of A. fib related symptoms as well as diastolic dysfunction and deconditioning.  We have been unable to maintain sinus rhythm without amiodarone, therefore would simply continue beta-blocker.  We have adjusted his Lasix and that did help some, but I think what he really needs to do is continue to lose weight and exercise.

## 2019-08-17 NOTE — Assessment & Plan Note (Signed)
Is on statin.  Labs have been followed by PCP, but nothing checked recently.  We will get labs checked within the next few weeks.  Then recheck before I see him back next year.

## 2019-08-17 NOTE — Assessment & Plan Note (Signed)
Chronic situation that is probably more related to venous stasis than true heart failure.  He is on standing dose of Lasix with as needed dosing.  This is in addition to 50 mg spironolactone.  For now he says things are stable.  I do want him to wear the support stockings whenever possible.  He has not been wearing them to the same extent of adherence that I would like.  When he does wear them it helps.

## 2019-08-17 NOTE — Patient Instructions (Signed)
Medication Instructions:   None - but would recommend Covid Vaccine once guaranteed both doses available.  *If you need a refill on your cardiac medications before your next appointment, please call your pharmacy*  Lab Work:  Would like to have chemistry panel & cholesterol panel done prior to annual f/u  If you have labs (blood work) drawn today and your tests are completely normal, you will receive your results only by: Marland Kitchen MyChart Message (if you have MyChart) OR . A paper copy in the mail If you have any lab test that is abnormal or we need to change your treatment, we will call you to review the results.  Testing/Procedures: none  Follow-Up: At [facility], you and your health needs are our priority.  As part of our continuing mission to provide you with exceptional heart care, we have created designated Provider Care Teams.  These Care Teams include your primary Cardiologist (physician) and Advanced Practice Providers (APPs -  Physician Assistants and Nurse Practitioners) who all work together to provide you with the care you need, when you need it.  Your next appointment:   12 month(s)  The format for your next appointment:   In Person  Provider:   Glenetta Hew, MD  Other Instructions  Need to get out & walk

## 2019-08-17 NOTE — Assessment & Plan Note (Signed)
Is on Metformin and ACE inhibitor.  A1c in June July 2019 was the last one I have checked was 6.5.  Should be due for having labs checked by PCP.  Would potentially consider the possibility of SGLT2 inhibitor given his edema for both diabetic and cardiovascular benefit.

## 2019-08-17 NOTE — Progress Notes (Signed)
Virtual Visit via Telephone Note   This visit type was conducted due to national recommendations for restrictions regarding the COVID-19 Pandemic (e.g. social distancing) in an effort to limit this patient's exposure and mitigate transmission in our community.  Due to his co-morbid illnesses, this patient is at least at moderate risk for complications without adequate follow up.  This format is felt to be most appropriate for this patient at this time.  The patient did not have access to video technology/had technical difficulties with video requiring transitioning to audio format only (telephone).  All issues noted in this document were discussed and addressed.  No physical exam could be performed with this format.  Please refer to the patient's chart for his  consent to telehealth for [facility].   Patient has given verbal permission to conduct this visit via virtual appointment and to bill insurance 08/17/2019 12:34 PM     Evaluation Performed:  Follow-up visit  Date:  08/17/2019   ID:  [name], DOB [birth year], MRN [id number]  Patient Location: Home Provider Location: Home  PCP:  Biagio Borg, MD  Cardiologist:  No primary care provider on file.  Electrophysiologist:  None   Chief Complaint:   Chief Complaint  Patient presents with  . 12 month visit  . Edema  . Hypertension    History of Present Illness:    [name] is a 74 y.o. male with PMH notable for hypertension, chronic lower extremity edema (likely venous stasis), PAF-now essentially persistent (no longer on amiodarone, on Xarelto) who presents via audio/video conferencing for a telehealth visit today.  Sair Banton was last seen on 03/11/2018 as a hospital follow-up stating edema was much better controlled after IV Lasix.  He had been admitted with heart failure and A. fib.  Was on higher dose of Lasix when I saw him.  Just noted lack of energy after his operation.  We increase his  spironolactone to 50 mg daily.  He was taking Lasix anything from 1/2 to 2 tablets a day.  Hospitalizations:  . None   Recent - Interim CV studies:   The following studies were reviewed today: . none:  Inerval History   [name] has been very scared staying home since Covid due to paranoia.  Not interacting with many people at all. He really does admit to me complaints about his health, just chronic back pain.  Bones and join okay okay can you eat if you put on the COVID-19 weight ts are bothering him more than most things.  Not as active.  About the most she does is walking down the driveway to get newspaper.  Not much in the way of fluid buildup, his weight gain is probably related to dietary discretion.  Stocking his freezer with ice cream. No problems at all with chest pain or pressure with rest or exertion.  No real PND orthopnea.  Pretty much has his chronic shortness of breath with exertion, but that is pretty much stable from years ago.    Swelling is pretty well controlled on the adjusted doses of diuretic.  Uses compression stockings intermittently, but not all the time.  Cardiovascular ROS: positive for - Baseline dyspnea with exertion and swelling. negative for - chest pain, irregular heartbeat, orthopnea, palpitations, paroxysmal nocturnal dyspnea, rapid heart rate or Syncope/near syncope, TIA/amaurosis fugax, claudication  ROS:  Please see the history of present illness.    The patient does not have symptoms concerning for COVID-19 infection (fever, chills, cough,  or new shortness of breath).  Review of Systems  Constitutional: Positive for malaise/fatigue (Not a lot of get up and go.) and weight loss (Admits to dietary indiscretion).  HENT: Negative for congestion and nosebleeds.   Respiratory: Positive for shortness of breath (Baseline). Negative for wheezing.   Cardiovascular: Positive for leg swelling (Stable one side worse than the other).    Gastrointestinal: Positive for constipation (Drinking prune juice for constipation). Negative for abdominal pain, blood in stool, heartburn and melena.  Genitourinary: Negative for hematuria.  Musculoskeletal: Positive for back pain and joint pain.  Neurological: Negative for dizziness and focal weakness.  Psychiatric/Behavioral: Negative for memory loss. The patient has insomnia. The patient is not nervous/anxious.    The patient is practicing social distancing.  Basically staying totally isolated.  Not going to play golf or to play dominoes with friends.  Past Medical History:  Diagnosis Date  . Adenoma 05/2008  . Alcoholism in recovery ([place])   . BPH (benign prostatic hyperplasia)   . Cancer [facility])    Prostate  . Chronic LBP    Hip & Back -- Sees Dr. Maia Petties @ [place]  . Colon polyps   . Complex renal cyst 12/2010  . Controlled type 2 diabetes mellitus with neuropathy ([place]) 01/2007  . COPD (chronic obstructive pulmonary disease) ([place])   . Diabetes ([place])   . Diverticulitis of colon   . ED (erectile dysfunction)    s/p Penile prosthesis (09/2010)  . History of prostate cancer    Dr. Karsten Ro  . History of sick sinus syndrome    reduced BB dose for Bradycardia  . HLD (hyperlipidemia)   . Hypertension   . Impaired glucose tolerance 12/21/2013  . Nephrolithiasis   . Osteoarthritis of both hips   . Osteoarthritis of lumbosacral spine    with Disc Disease  . PAF (paroxysmal atrial fibrillation) (HCC)    No longer on Amiodarone.  Not on Anticoaguation b/c no recurrence.  . Paresthesias/numbness    Bilateral LE  . Peripheral neuropathy 12/21/2013    Past Surgical History:  Procedure Laterality Date  . CARDIAC CATHETERIZATION  2003   Normal Coronary Arteries.  Marland Kitchen NM MYOVIEW LTD  03/2016   No Ischemia or Infarct - Visual EF ~60% (computer EF ~39%) - by Echo 55-60%  . PENILE PROSTHESIS IMPLANT  06/21/2012   Procedure: PENILE PROTHESIS INFLATABLE;  Surgeon: Claybon Jabs, MD;  [facility];  Service: Urology;  Laterality: N/A;  REMOVAL AND REPLACEMENT OF SOME  OF PROSTHESIS ([facility])   . PENILE PROSTHESIS PLACEMENT  09/2010  . PROSTATECTOMY    . REMOVAL OF PENILE PROSTHESIS N/A 02/15/2018   Procedure: REMOVAL OF PENILE PROSTHESIS;  Surgeon: Kathie Rhodes, MD;  Location: [facility];  Service: Urology;  Laterality: N/A;  . TRANSTHORACIC ECHOCARDIOGRAM  04/2016   a) Relatively normal EF of 55-60%. No RWMA suggesting no prior MI. Gr 2 DD (pseudo-normal filling pattern) along with moderately dilated left atrium.;; b) 02/2018: Mod LVH. EF 65-70%. No RWMA.  Unable to assess DF 2/2 Afib. Mild MR. Severe BiAtrial Enlargement. High CVP.     Current Meds  Medication Sig  . Albuterol Sulfate 108 (90 Base) MCG/ACT AEPB Inhale 2 puffs into the lungs every 6 (six) hours as needed. (Patient taking differently: Inhale 2 puffs into the lungs every 6 (six) hours as needed (shortness of breath). )  . amLODipine (NORVASC) 10 MG tablet TAKE 1 TABLET BY MOUTH  DAILY  .  benazepril (LOTENSIN) 40 MG tablet TAKE 1 TABLET BY MOUTH  DAILY  . Blood Glucose Monitoring Suppl (ONE TOUCH ULTRA 2) w/Device KIT Use as directed E11.9  . furosemide (LASIX) 40 MG tablet Take 40 mg by mouth. Take 80 mg in the morning  and 40 mg in the evening  . glucose blood (ONE TOUCH ULTRA TEST) test strip Use as instructed once per day E11.9  . Ibuprofen 200 MG CAPS Take 200 mg by mouth. Take 1 to 2 tablet twice a day as needed  . loratadine (CLARITIN) 10 MG tablet Take 10 mg by mouth daily as needed for allergies.  . metFORMIN (GLUCOPHAGE-XR) 500 MG 24 hr tablet TAKE 1 TABLET BY MOUTH  DAILY WITH BREAKFAST  . metoprolol succinate (TOPROL-XL) 100 MG 24 hr tablet Take 1 tablet (100 mg total) by mouth daily. *NEEDS OFFICE VISIT FOR FURTHER REFILLS. KEEP 08/17/2019 APPOINTMENT*  . omeprazole (PRILOSEC) 20 MG capsule TAKE 1 CAPSULE BY MOUTH  DAILY  . ONETOUCH DELICA LANCETS 46N MISC USE 1 TIME PER  DAY  . potassium chloride SA (K-DUR,KLOR-CON) 20 MEQ tablet TAKE 1 TO 2 TABLETS BY  MOUTH DAILY AS NEEDED FOR  WEIGHT GAIN OF 3LBS OR MORE  . pravastatin (PRAVACHOL) 40 MG tablet TAKE 1 TABLET BY MOUTH  DAILY  . rivaroxaban (XARELTO) 20 MG TABS tablet Take 1 tablet (20 mg total) by mouth daily with supper. LABS NEEDED FOR FURTHER REFIILLS  . spironolactone (ALDACTONE) 50 MG tablet TAKE 1 TABLET BY MOUTH  DAILY     Allergies:   Ciprofloxacin   Social History   Tobacco Use  . Smoking status: Former Smoker    Types: Cigarettes    Quit date: 11/04/1982    Years since quitting: 36.8  . Smokeless tobacco: Never Used  Substance Use Topics  . Alcohol use: Yes    Comment: no alcohol x 1 week wants to stop  . Drug use: No    Types: Marijuana    Comment: use to smoke marijuana     Family Hx: The patient's family history includes Coronary artery disease in his father and mother; Diabetes in his father; Heart attack in his mother; Prostate cancer in his father.   Labs/Other Tests and Data Reviewed:    EKG:  No ECG reviewed.  Recent Labs: No results found for requested labs within last 8760 hours.   Recent Lipid Panel Lab Results  Component Value Date/Time   CHOL 138 07/29/2017 11:46 AM   TRIG 63.0 07/29/2017 11:46 AM   HDL 32.30 (L) 07/29/2017 11:46 AM   CHOLHDL 4 07/29/2017 11:46 AM   LDLCALC 93 07/29/2017 11:46 AM    Wt Readings from Last 3 Encounters:  08/17/19 281 lb (127.5 kg)  04/27/18 263 lb (119.3 kg)  03/11/18 258 lb 9.6 oz (117.3 kg)     Objective:    Vital Signs:  BP (!) 150/82   Pulse 63   Temp 98 F (36.7 C)   Ht '6\' 1"'  (1.854 m)   Wt 281 lb (127.5 kg)   BMI 37.07 kg/m   --Has not yet taken his blood pressure medicines this morning. VITAL SIGNS:  reviewed Well nourished, well developed male in NO acute distress. A&O x 3. Normal Moodd & Affect Non-labored respirations   ASSESSMENT & PLAN:    Problem List Items Addressed This Visit    Exertional  dyspnea (Chronic)    Likely combination of A. fib related symptoms as well as diastolic dysfunction and deconditioning.  We have been unable to maintain sinus rhythm without amiodarone, therefore would simply continue beta-blocker.  We have adjusted his Lasix and that did help some, but I think what he really needs to do is continue to lose weight and exercise.      Swelling of lower extremity (Chronic)    Chronic situation that is probably more related to venous stasis than true heart failure.  He is on standing dose of Lasix with as needed dosing.  This is in addition to 50 mg spironolactone.  For now he says things are stable.  I do want him to wear the support stockings whenever possible.  He has not been wearing them to the same extent of adherence that I would like.  When he does wear them it helps.      Hypertensive heart disease with chronic diastolic congestive heart failure (HCC) (Chronic)    His blood pressure is little high today, but he has not taken any of his medicines yet this morning.  Only took his Lasix prior to breakfast.  Usually after he takes his blood pressure medicines his blood pressures are better.  For now we will continue current medications with Toprol amlodipine and benazepril along with spironolactone and Lasix.  With no significant PND or orthopnea and changes to his dyspnea, no need for changes at this point.  He will be due for having lipids and chemistries checked to ensure stable renal function and potassium levels.      Relevant Medications   furosemide (LASIX) 40 MG tablet   PAF (paroxysmal atrial fibrillation) - now off amiodarone; asymptomatic. CHA2DS2-VASc Score -5 (on Xarelto) (Chronic)    Has been pretty much persistent A. fib in the past few times have seen him.  Rate controlled on Toprol.  Is now back on Xarelto with no bleeding issues.  With no untoward symptoms of being in A. fib, I think we can probably simply leave him in sinus rhythm since we  came off the amiodarone.  Rates seem adequately controlled with Toprol.      Relevant Medications   furosemide (LASIX) 40 MG tablet   Hyperlipidemia with target LDL less than 100 (Chronic)    Is on statin.  Labs have been followed by PCP, but nothing checked recently.  We will get labs checked within the next few weeks.  Then recheck before I see him back next year.      Relevant Medications   furosemide (LASIX) 40 MG tablet      COVID-19 Education: The signs and symptoms of COVID-19 were discussed with the patient and how to seek care for testing (follow up with PCP or arrange E-visit).   The importance of social distancing was discussed today.  Time:   Today, I have spent 40+  minutes with the patient with telehealth technology discussing the above problems.  Majority of the discussion was based upon discussion of concerns about COVID   Medication Adjustments/Labs and Tests Ordered: Current medicines are reviewed at length with the patient today.  Concerns regarding medicines are outlined above.   Patient Instructions  Medication Instructions:   None - but would recommend Covid Vaccine once guaranteed both doses available.  *If you need a refill on your cardiac medications before your next appointment, please call your pharmacy*  Lab Work:  Would like to have chemistry panel & cholesterol panel done prior to annual f/u  If you have labs (blood work) drawn today and your tests are completely normal, you will  receive your results only by: Marland Kitchen MyChart Message (if you have MyChart) OR . A paper copy in the mail If you have any lab test that is abnormal or we need to change your treatment, we will call you to review the results.  Testing/Procedures: none  Follow-Up: At [facility], you and your health needs are our priority.  As part of our continuing mission to provide you with exceptional heart care, we have created designated Provider Care Teams.  These Care Teams  include your primary Cardiologist (physician) and Advanced Practice Providers (APPs -  Physician Assistants and Nurse Practitioners) who all work together to provide you with the care you need, when you need it.  Your next appointment:   12 month(s)  The format for your next appointment:   In Person  Provider:   Glenetta Hew, MD  Other Instructions  Need to get out & walk    Signed, Glenetta Hew, MD  08/17/2019 12:34 PM    [place]

## 2019-08-17 NOTE — Assessment & Plan Note (Signed)
His blood pressure is little high today, but he has not taken any of his medicines yet this morning.  Only took his Lasix prior to breakfast.  Usually after he takes his blood pressure medicines his blood pressures are better.  For now we will continue current medications with Toprol amlodipine and benazepril along with spironolactone and Lasix.  With no significant PND or orthopnea and changes to his dyspnea, no need for changes at this point.  He will be due for having lipids and chemistries checked to ensure stable renal function and potassium levels.

## 2019-08-17 NOTE — Telephone Encounter (Signed)
Left detailed message per Dr Ellyn Hack okay to take the Shingles vaccine ./cy

## 2019-08-17 NOTE — Assessment & Plan Note (Signed)
Has been pretty much persistent A. fib in the past few times have seen him.  Rate controlled on Toprol.  Is now back on Xarelto with no bleeding issues.  With no untoward symptoms of being in A. fib, I think we can probably simply leave him in sinus rhythm since we came off the amiodarone.  Rates seem adequately controlled with Toprol.

## 2019-08-20 ENCOUNTER — Other Ambulatory Visit: Payer: Self-pay | Admitting: Cardiology

## 2019-08-21 ENCOUNTER — Other Ambulatory Visit: Payer: Self-pay | Admitting: Cardiology

## 2019-08-22 ENCOUNTER — Other Ambulatory Visit: Payer: Self-pay | Admitting: Cardiology

## 2019-08-22 NOTE — Telephone Encounter (Signed)
lmom for labs 

## 2019-08-22 NOTE — Telephone Encounter (Signed)
*  STAT* If patient is at the pharmacy, call can be transferred to refill team.   1. Which medications need to be refilled? (please list name of each medication and dose if known)  A new prescription for his Metoprolol at his local Rx until his mail order comes*  2. Which pharmacy/location (including street and city if local pharmacy) is medication to be sent to?  [place], [place]  3. Do they need a 30 day or 90 day supply?  12- until his mail order comes

## 2019-08-23 ENCOUNTER — Other Ambulatory Visit: Payer: Self-pay | Admitting: Cardiology

## 2019-08-23 MED ORDER — METOPROLOL SUCCINATE ER 100 MG PO TB24: 100.0000 mg | ORAL_TABLET | Freq: Every day | ORAL | 0 refills | Status: DC

## 2019-08-23 NOTE — Telephone Encounter (Signed)
*  STAT* If patient is at the pharmacy, call can be transferred to refill team.   1. Which medications need to be refilled? (please list name of each medication and dose if known)  metoprolol succinate (TOPROL-XL) 100 MG 24 hr tablet  2. Which pharmacy/location (including street and city if local pharmacy) is medication to be sent to?  [facility] ([place]), [place] - [address]   3. Do they need a 30 day or 90 day supply? 30   The patient called and needed to make sure Dr. Ellyn Hack got the request from  [facility] to fill a short term fill of the patient's metoprolol.  The patient had been getting the medication from [facility], and  [facility] would not fill the medication until the patient was seen by Dr. Ellyn Hack. The patient had a telemedicine visit with Dr. Ellyn Hack on 08-17-19.   The patient will be taking the last dose of his medicine he has on hand today. He will need to get a short term fill from [facility] to last him until the rx comes in the mail.   He also wanted to know if there would be any side effects if the patient were to miss a dose or two of the medication.

## 2019-08-26 DIAGNOSIS — I5032 Chronic diastolic (congestive) heart failure: Secondary | ICD-10-CM | POA: Diagnosis not present

## 2019-08-26 DIAGNOSIS — I110 Hypertensive heart disease with heart failure: Secondary | ICD-10-CM | POA: Diagnosis not present

## 2019-08-26 DIAGNOSIS — Z79899 Other long term (current) drug therapy: Secondary | ICD-10-CM | POA: Diagnosis not present

## 2019-08-26 DIAGNOSIS — I480 Paroxysmal atrial fibrillation: Secondary | ICD-10-CM | POA: Diagnosis not present

## 2019-08-27 LAB — BASIC METABOLIC PANEL
BUN/Creatinine Ratio: 17 (ref 10–24)
BUN: 32 mg/dL — ABNORMAL HIGH (ref 8–27)
CO2: 13 mmol/L — ABNORMAL LOW (ref 20–29)
Calcium: 9.3 mg/dL (ref 8.6–10.2)
Chloride: 104 mmol/L (ref 96–106)
Creatinine, Ser: 1.86 mg/dL — ABNORMAL HIGH (ref 0.76–1.27)
GFR calc Af Amer: 41 mL/min/{1.73_m2} — ABNORMAL LOW (ref >59)
GFR calc non Af Amer: 35 mL/min/{1.73_m2} — ABNORMAL LOW (ref >59)
Glucose: 117 mg/dL — ABNORMAL HIGH (ref 65–99)
Potassium: 4.3 mmol/L (ref 3.5–5.2)
Sodium: 136 mmol/L (ref 134–144)

## 2019-08-29 ENCOUNTER — Telehealth: Payer: Self-pay | Admitting: *Deleted

## 2019-08-29 DIAGNOSIS — I110 Hypertensive heart disease with heart failure: Secondary | ICD-10-CM

## 2019-08-29 DIAGNOSIS — Z79899 Other long term (current) drug therapy: Secondary | ICD-10-CM

## 2019-08-29 DIAGNOSIS — I5032 Chronic diastolic (congestive) heart failure: Secondary | ICD-10-CM

## 2019-08-29 DIAGNOSIS — R7989 Other specified abnormal findings of blood chemistry: Secondary | ICD-10-CM

## 2019-08-29 NOTE — Telephone Encounter (Signed)
The patient has been notified of the result and verbalized understanding.  All questions (if any) were answered. AWARE TO HAVE LABS DONE @ 2/ 17/21 Raiford Simmonds, RN 08/29/2019 5:53 PM

## 2019-08-29 NOTE — Telephone Encounter (Signed)
-----   Message from Leonie Man, MD sent at 08/28/2019  4:38 PM EST ----- Lab results show that the kidney function is down a little bit.  Looks a little bit dehydrated.  Would hold spironolactone for 3 days, increase PO hydration.  Need to recheck in about a week.  Glenetta Hew, MD

## 2019-08-30 ENCOUNTER — Other Ambulatory Visit: Payer: Self-pay | Admitting: Cardiology

## 2019-08-30 NOTE — Telephone Encounter (Signed)
Follow Up  Pt called because he wanted to clarify what was told to him by the nurse. Says he forgot what he should do and did not write it down.

## 2019-08-30 NOTE — Telephone Encounter (Signed)
Spoke to patient --  Medication needs hold for 3 days is spironolactone ' patient verbalized understanding  He states he did not write it down yesterday.

## 2019-10-06 ENCOUNTER — Encounter: Payer: Self-pay | Admitting: Internal Medicine

## 2019-10-06 ENCOUNTER — Other Ambulatory Visit: Payer: Self-pay

## 2019-10-06 ENCOUNTER — Ambulatory Visit (INDEPENDENT_AMBULATORY_CARE_PROVIDER_SITE_OTHER): Payer: Medicare Other | Admitting: Internal Medicine

## 2019-10-06 VITALS — BP 160/76 | HR 62 | Temp 97.6°F | Ht 73.0 in | Wt 279.0 lb

## 2019-10-06 DIAGNOSIS — N183 Chronic kidney disease, stage 3 unspecified: Secondary | ICD-10-CM | POA: Diagnosis not present

## 2019-10-06 DIAGNOSIS — E11622 Type 2 diabetes mellitus with other skin ulcer: Secondary | ICD-10-CM | POA: Diagnosis not present

## 2019-10-06 DIAGNOSIS — E611 Iron deficiency: Secondary | ICD-10-CM | POA: Diagnosis not present

## 2019-10-06 DIAGNOSIS — E559 Vitamin D deficiency, unspecified: Secondary | ICD-10-CM

## 2019-10-06 DIAGNOSIS — Z0000 Encounter for general adult medical examination without abnormal findings: Secondary | ICD-10-CM | POA: Diagnosis not present

## 2019-10-06 DIAGNOSIS — N1832 Chronic kidney disease, stage 3b: Secondary | ICD-10-CM | POA: Insufficient documentation

## 2019-10-06 DIAGNOSIS — E538 Deficiency of other specified B group vitamins: Secondary | ICD-10-CM

## 2019-10-06 DIAGNOSIS — I110 Hypertensive heart disease with heart failure: Secondary | ICD-10-CM | POA: Diagnosis not present

## 2019-10-06 DIAGNOSIS — E119 Type 2 diabetes mellitus without complications: Secondary | ICD-10-CM

## 2019-10-06 DIAGNOSIS — Z0001 Encounter for general adult medical examination with abnormal findings: Secondary | ICD-10-CM

## 2019-10-06 DIAGNOSIS — L97909 Non-pressure chronic ulcer of unspecified part of unspecified lower leg with unspecified severity: Secondary | ICD-10-CM

## 2019-10-06 DIAGNOSIS — I5032 Chronic diastolic (congestive) heart failure: Secondary | ICD-10-CM

## 2019-10-06 MED ORDER — FUROSEMIDE 40 MG PO TABS: ORAL_TABLET | ORAL | 3 refills | Status: DC

## 2019-10-06 MED ORDER — ONETOUCH DELICA LANCETS 33G MISC: 2 refills | Status: DC

## 2019-10-06 MED ORDER — DOXYCYCLINE HYCLATE 100 MG PO TABS: 100.0000 mg | ORAL_TABLET | Freq: Two times a day (BID) | ORAL | 0 refills | Status: DC

## 2019-10-06 NOTE — Assessment & Plan Note (Addendum)
Small superficial with ? Early cellutlis - for doxy course, and refer wound clinic  I spent 31 minutes in addition to time for CPX wellness examination in preparing to see the patient by review of recent labs, imaging and procedures, obtaining and reviewing separately obtained history, communicating with the patient and family or caregiver, ordering medications, tests or procedures, and documenting clinical information in the EHR including the differential Dx, treatment, and any further evaluation and other management of diab leg ulcer, htn, DM, cKD

## 2019-10-06 NOTE — Assessment & Plan Note (Signed)
stable overall by history and exam, recent data reviewed with pt, and pt to continue medical treatment as before,  to f/u any worsening symptoms or concerns  

## 2019-10-06 NOTE — Progress Notes (Signed)
Subjective:    Patient ID: [name], male    DOB: October 08, 1945, 74 y.o.   MRN: [id number]  HPI  Here for wellness and f/u;  Overall doing ok;  Pt denies Chest pain, worsening SOB, DOE, wheezing, orthopnea, PND, worsening LE edema, palpitations, dizziness or syncope.  Pt denies neurological change such as new headache, facial or extremity weakness.  Pt denies polydipsia, polyuria, or low sugar symptoms. Pt states overall good compliance with treatment and medications, good tolerability, and has been trying to follow appropriate diet.  Pt denies worsening depressive symptoms, suicidal ideation or panic. No fever, night sweats, wt loss, loss of appetite, or other constitutional symptoms.  Pt states good ability with ADL's, has low fall risk, home safety reviewed and adequate, no other significant changes in hearing or vision, and only occasionally active with exercise.  Plans to f/u with Dr Katy Fitch for eye soon. Also with new shallow small wound to distal medial RLE just above the ankle x 1 wk after bumping the leg on furniture.  Daughter urged him to come today.  Denies high fever, chills or drainage.but has some mild redness and swelling.  CBGs at home most often in the low 100's.  BP at home has been < 140/90.   Past Medical History:  Diagnosis Date  . Adenoma 05/2008  . Alcoholism in recovery ([place])   . BPH (benign prostatic hyperplasia)   . Cancer [facility])    Prostate  . Chronic LBP    Hip & Back -- Sees Dr. Maia Petties @ [place]  . Colon polyps   . Complex renal cyst 12/2010  . Controlled type 2 diabetes mellitus with neuropathy ([place]) 01/2007  . COPD (chronic obstructive pulmonary disease) ([place])   . Diabetes ([place])   . Diverticulitis of colon   . ED (erectile dysfunction)    s/p Penile prosthesis (09/2010)  . History of prostate cancer    Dr. Karsten Ro  . History of sick sinus syndrome    reduced BB dose for Bradycardia  . HLD (hyperlipidemia)   . Hypertension   . Impaired  glucose tolerance 12/21/2013  . Nephrolithiasis   . Osteoarthritis of both hips   . Osteoarthritis of lumbosacral spine    with Disc Disease  . PAF (paroxysmal atrial fibrillation) ([facility])    No longer on Amiodarone.  Not on Anticoaguation b/c no recurrence.  . Paresthesias/numbness    Bilateral LE  . Peripheral neuropathy 12/21/2013   Past Surgical History:  Procedure Laterality Date  . CARDIAC CATHETERIZATION  2003   Normal Coronary Arteries.  Marland Kitchen NM MYOVIEW LTD  03/2016   No Ischemia or Infarct - Visual EF ~60% (computer EF ~39%) - by Echo 55-60%  . PENILE PROSTHESIS IMPLANT  06/21/2012   Procedure: PENILE PROTHESIS INFLATABLE;  Surgeon: Claybon Jabs, MD;  Location: [facility];  Service: Urology;  Laterality: N/A;  REMOVAL AND REPLACEMENT OF SOME  OF PROSTHESIS ([facility])   . PENILE PROSTHESIS PLACEMENT  09/2010  . PROSTATECTOMY    . REMOVAL OF PENILE PROSTHESIS N/A 02/15/2018   Procedure: REMOVAL OF PENILE PROSTHESIS;  Surgeon: Kathie Rhodes, MD;  Location: [facility];  Service: Urology;  Laterality: N/A;  . TRANSTHORACIC ECHOCARDIOGRAM  04/2016   a) Relatively normal EF of 55-60%. No RWMA suggesting no prior MI. Gr 2 DD (pseudo-normal filling pattern) along with moderately dilated left atrium.;; b) 02/2018: Mod LVH. EF 65-70%. No RWMA.  Unable to assess DF 2/2 Afib. Mild MR. Severe  BiAtrial Enlargement. High CVP.    reports that he quit smoking about 36 years ago. His smoking use included cigarettes. He has never used smokeless tobacco. He reports current alcohol use. He reports that he does not use drugs. family history includes Coronary artery disease in his father and mother; Diabetes in his father; Heart attack in his mother; Prostate cancer in his father. Allergies  Allergen Reactions  . Ciprofloxacin Other (See Comments)    All over weakness   Current Outpatient Medications on File Prior to Visit  Medication Sig Dispense Refill  . Albuterol Sulfate 108 (90 Base) MCG/ACT  AEPB Inhale 2 puffs into the lungs every 6 (six) hours as needed. (Patient taking differently: Inhale 2 puffs into the lungs every 6 (six) hours as needed (shortness of breath). ) 1 each 5  . amLODipine (NORVASC) 10 MG tablet TAKE 1 TABLET BY MOUTH  DAILY 90 tablet 3  . benazepril (LOTENSIN) 40 MG tablet TAKE 1 TABLET BY MOUTH  DAILY 90 tablet 3  . Blood Glucose Monitoring Suppl (ONE TOUCH ULTRA 2) w/Device KIT Use as directed E11.9 1 each 0  . glucose blood (ONE TOUCH ULTRA TEST) test strip Use as instructed once per day E11.9 100 each 12  . Ibuprofen 200 MG CAPS Take 200 mg by mouth. Take 1 to 2 tablet twice a day as needed    . loratadine (CLARITIN) 10 MG tablet Take 10 mg by mouth daily as needed for allergies.    . metFORMIN (GLUCOPHAGE-XR) 500 MG 24 hr tablet TAKE 1 TABLET BY MOUTH  DAILY WITH BREAKFAST 90 tablet 1  . metoprolol succinate (TOPROL-XL) 100 MG 24 hr tablet Take 1 tablet (100 mg total) by mouth daily. Take with or immediately following a meal. 30 tablet 0  . omeprazole (PRILOSEC) 20 MG capsule TAKE 1 CAPSULE BY MOUTH  DAILY 90 capsule 3  . potassium chloride SA (K-DUR,KLOR-CON) 20 MEQ tablet TAKE 1 TO 2 TABLETS BY  MOUTH DAILY AS NEEDED FOR  WEIGHT GAIN OF 3LBS OR MORE 180 tablet 3  . pravastatin (PRAVACHOL) 40 MG tablet TAKE 1 TABLET BY MOUTH  DAILY 90 tablet 3  . spironolactone (ALDACTONE) 50 MG tablet TAKE 1 TABLET BY MOUTH  DAILY 90 tablet 3  . XARELTO 20 MG TABS tablet TAKE 1 TABLET BY MOUTH  DAILY WITH SUPPER 30 tablet 11   No current facility-administered medications on file prior to visit.   Review of Systems All otherwise neg per pt     Objective:   Physical Exam BP (!) 160/76   Pulse 62   Temp 97.6 F (36.4 C)   Ht _0  (1.854 m)   Wt 279 lb (126.6 kg)   SpO2 99%   BMI 36.81 kg/m  VS noted,  Constitutional: Pt appears in NAD HENT: Head: NCAT.  Right Ear: External ear normal.  Left Ear: External ear normal.  Eyes: . Pupils are equal, round, and  reactive to light. Conjunctivae and EOM are normal Nose: without d/c or deformity Neck: Neck supple. Gross normal ROM Cardiovascular: Normal rate and regular rhythm.   Pulmonary/Chest: Effort normal and breath sounds without rales or wheezing.  Abd:  Soft, NT, ND, + BS, no organomegaly Neurological: Pt is alert. At baseline orientation, motor grossly intact Skin:, chronic RLE edema 1+ to knee with 1/2 x 1 cm very shallow ulcer with red, tender edges - no drainage, LLE with trace edema Psychiatric: Pt behavior is normal without agitation  All otherwise neg  per pt Lab Results  Component Value Date   WBC 9.6 03/01/2018   HGB 11.1 (L) 03/01/2018   HCT 33.5 (L) 03/01/2018   PLT 510.0 (H) 03/01/2018   GLUCOSE 117 (H) 08/26/2019   CHOL 138 07/29/2017   TRIG 63.0 07/29/2017   HDL 32.30 (L) 07/29/2017   LDLCALC 93 07/29/2017   ALT 18 04/27/2018   AST 22 04/27/2018   NA 136 08/26/2019   K 4.3 08/26/2019   CL 104 08/26/2019   CREATININE 1.86 (H) 08/26/2019   BUN 32 (H) 08/26/2019   CO2 13 (L) 08/26/2019   TSH 1.00 07/29/2017   PSA 0.09 (L) 07/29/2017   INR 1.63 (H) 08/26/2010   HGBA1C 6.5 (H) 02/10/2018   MICROALBUR 1.4 08/27/2009      Assessment & Plan:

## 2019-10-06 NOTE — Patient Instructions (Signed)
Please see Dr Katy Fitch for the yearly eye exam.  Please make a appt with the [employer] vaccination clinic  Please take all new medication as prescribed - the antibiotic  You will be contacted regarding the referral for: Wound clinic  Please continue all other medications as before, and refills have been done if requested.  Please have the pharmacy call with any other refills you may need.  Please continue your efforts at being more active, low cholesterol diet, and weight control.  You are otherwise up to date with prevention measures today.  Please keep your appointments with your specialists as you may have planned  Please go to the LAB at the blood drawing area for the tests to be done  You will be contacted by phone if any changes need to be made immediately.  Otherwise, you will receive a letter about your results with an explanation, but please check with MyChart first.  Please remember to sign up for MyChart if you have not done so, as this will be important to you in the future with finding out test results, communicating by private email, and scheduling acute appointments online when needed.  Please make an Appointment to return in 6 months, or sooner if needed

## 2019-10-06 NOTE — Assessment & Plan Note (Signed)
With mild worsening on aldactone recently, for f/u lab

## 2019-10-06 NOTE — Assessment & Plan Note (Signed)

## 2019-10-06 NOTE — Assessment & Plan Note (Signed)
Pt declines tx change for now

## 2019-10-07 ENCOUNTER — Encounter: Payer: Self-pay | Admitting: Internal Medicine

## 2019-10-07 ENCOUNTER — Other Ambulatory Visit: Payer: Self-pay | Admitting: Internal Medicine

## 2019-10-07 DIAGNOSIS — I509 Heart failure, unspecified: Secondary | ICD-10-CM

## 2019-10-07 DIAGNOSIS — E875 Hyperkalemia: Secondary | ICD-10-CM

## 2019-10-07 LAB — URINALYSIS, ROUTINE W REFLEX MICROSCOPIC
Bilirubin Urine: NEGATIVE
Ketones, ur: NEGATIVE
Nitrite: NEGATIVE
Specific Gravity, Urine: 1.02 (ref 1.000–1.030)
Total Protein, Urine: NEGATIVE
Urine Glucose: NEGATIVE
Urobilinogen, UA: 0.2 (ref 0.0–1.0)
pH: 5.5 (ref 5.0–8.0)

## 2019-10-07 LAB — MICROALBUMIN / CREATININE URINE RATIO
Creatinine,U: 52 mg/dL
Microalb Creat Ratio: 3.4 mg/g (ref 0.0–30.0)
Microalb, Ur: 1.8 mg/dL (ref 0.0–1.9)

## 2019-10-07 LAB — CBC WITH DIFFERENTIAL/PLATELET
Basophils Absolute: 0.1 10*3/uL (ref 0.0–0.1)
Basophils Relative: 0.8 % (ref 0.0–3.0)
Eosinophils Absolute: 0.1 10*3/uL (ref 0.0–0.7)
Eosinophils Relative: 1.7 % (ref 0.0–5.0)
HCT: 39.7 % (ref 39.0–52.0)
Hemoglobin: 12.9 g/dL — ABNORMAL LOW (ref 13.0–17.0)
Lymphocytes Relative: 25.2 % (ref 12.0–46.0)
Lymphs Abs: 1.8 10*3/uL (ref 0.7–4.0)
MCHC: 32.4 g/dL (ref 30.0–36.0)
MCV: 87.3 fl (ref 78.0–100.0)
Monocytes Absolute: 0.8 10*3/uL (ref 0.1–1.0)
Monocytes Relative: 10.8 % (ref 3.0–12.0)
Neutro Abs: 4.3 10*3/uL (ref 1.4–7.7)
Neutrophils Relative %: 61.5 % (ref 43.0–77.0)
Platelets: 208 10*3/uL (ref 150.0–400.0)
RBC: 4.55 Mil/uL (ref 4.22–5.81)
RDW: 15 % (ref 11.5–15.5)
WBC: 7 10*3/uL (ref 4.0–10.5)

## 2019-10-07 LAB — HEPATIC FUNCTION PANEL
ALT: 8 U/L (ref 0–53)
AST: 16 U/L (ref 0–37)
Albumin: 3.6 g/dL (ref 3.5–5.2)
Alkaline Phosphatase: 95 U/L (ref 39–117)
Bilirubin, Direct: 0 mg/dL (ref 0.0–0.3)
Total Bilirubin: 0.5 mg/dL (ref 0.2–1.2)
Total Protein: 8.5 g/dL — ABNORMAL HIGH (ref 6.0–8.3)

## 2019-10-07 LAB — LIPID PANEL
Cholesterol: 123 mg/dL (ref 0–200)
HDL: 28.7 mg/dL — ABNORMAL LOW (ref >39.00)
LDL Cholesterol: 80 mg/dL (ref 0–99)
NonHDL: 94.16
Total CHOL/HDL Ratio: 4
Triglycerides: 69 mg/dL (ref 0.0–149.0)
VLDL: 13.8 mg/dL (ref 0.0–40.0)

## 2019-10-07 LAB — IBC PANEL
Iron: 62 ug/dL (ref 42–165)
Saturation Ratios: 18.4 % — ABNORMAL LOW (ref 20.0–50.0)
Transferrin: 241 mg/dL (ref 212.0–360.0)

## 2019-10-07 LAB — VITAMIN D 25 HYDROXY (VIT D DEFICIENCY, FRACTURES): VITD: 21.79 ng/mL — ABNORMAL LOW (ref 30.00–100.00)

## 2019-10-07 LAB — BASIC METABOLIC PANEL
BUN: 49 mg/dL — ABNORMAL HIGH (ref 6–23)
CO2: 22 mEq/L (ref 19–32)
Calcium: 9.3 mg/dL (ref 8.4–10.5)
Chloride: 101 mEq/L (ref 96–112)
Creatinine, Ser: 2.17 mg/dL — ABNORMAL HIGH (ref 0.40–1.50)
GFR: 36.27 mL/min — ABNORMAL LOW (ref >60.00)
Glucose, Bld: 108 mg/dL — ABNORMAL HIGH (ref 70–99)
Potassium: 5.3 mEq/L — ABNORMAL HIGH (ref 3.5–5.1)
Sodium: 132 mEq/L — ABNORMAL LOW (ref 135–145)

## 2019-10-07 LAB — VITAMIN B12: Vitamin B-12: 292 pg/mL (ref 211–911)

## 2019-10-07 LAB — TSH: TSH: 1.07 u[IU]/mL (ref 0.35–4.50)

## 2019-10-07 LAB — PSA: PSA: 0 ng/mL — ABNORMAL LOW (ref 0.10–4.00)

## 2019-10-07 LAB — HEMOGLOBIN A1C: Hgb A1c MFr Bld: 7.1 % — ABNORMAL HIGH (ref 4.6–6.5)

## 2019-10-07 MED ORDER — VITAMIN D (ERGOCALCIFEROL) 1.25 MG (50000 UNIT) PO CAPS: 50000.0000 [IU] | ORAL_CAPSULE | ORAL | 0 refills | Status: DC

## 2019-10-07 MED ORDER — SPIRONOLACTONE 25 MG PO TABS: 25.0000 mg | ORAL_TABLET | Freq: Every day | ORAL | 3 refills | Status: DC

## 2019-10-14 ENCOUNTER — Telehealth: Payer: Self-pay | Admitting: Internal Medicine

## 2019-10-14 ENCOUNTER — Telehealth: Payer: Self-pay | Admitting: Cardiology

## 2019-10-14 NOTE — Telephone Encounter (Signed)
Patient calling and is requesting to know if it is safe for him to get a COVID vaccine and his Shingles vaccine at the same time? Please advise.  CB#: [phone]

## 2019-10-14 NOTE — Telephone Encounter (Signed)
Called pt and advised him to get the shots at least two weeks apart. Pt states he is in the processes setting up covid will get those out the way first../lmb

## 2019-10-14 NOTE — Telephone Encounter (Signed)
We are recommending the COVID-19 vaccine to all of our patients. Cardiac medications (including blood thinners) should not deter anyone from being vaccinated and there is no need to hold any of those medications prior to vaccine administration.     Currently, there is a hotline to call (active 07/29/19) to schedule vaccination appointments as no walk-ins will be accepted.   Number: [phone].    If an appointment is not available please go to [email or web address] to sign up for notification when additional vaccine appointments are available.   If you have further questions or concerns about the vaccine process, please visit [email or web address] or contact your primary care physician.   

## 2019-10-14 NOTE — Telephone Encounter (Signed)
    Patient declined to give reason for his call, stating a scheduler can not help him  Please call

## 2019-10-19 ENCOUNTER — Other Ambulatory Visit: Payer: Self-pay

## 2019-10-19 ENCOUNTER — Encounter (HOSPITAL_BASED_OUTPATIENT_CLINIC_OR_DEPARTMENT_OTHER): Payer: Medicare Other | Attending: Physician Assistant | Admitting: Physician Assistant

## 2019-10-19 DIAGNOSIS — Z7901 Long term (current) use of anticoagulants: Secondary | ICD-10-CM | POA: Diagnosis not present

## 2019-10-19 DIAGNOSIS — I480 Paroxysmal atrial fibrillation: Secondary | ICD-10-CM | POA: Diagnosis not present

## 2019-10-19 DIAGNOSIS — Z87891 Personal history of nicotine dependence: Secondary | ICD-10-CM | POA: Insufficient documentation

## 2019-10-19 DIAGNOSIS — I10 Essential (primary) hypertension: Secondary | ICD-10-CM | POA: Diagnosis not present

## 2019-10-19 DIAGNOSIS — L97812 Non-pressure chronic ulcer of other part of right lower leg with fat layer exposed: Secondary | ICD-10-CM | POA: Insufficient documentation

## 2019-10-19 DIAGNOSIS — I872 Venous insufficiency (chronic) (peripheral): Secondary | ICD-10-CM | POA: Diagnosis not present

## 2019-10-19 DIAGNOSIS — J449 Chronic obstructive pulmonary disease, unspecified: Secondary | ICD-10-CM | POA: Insufficient documentation

## 2019-10-19 DIAGNOSIS — I890 Lymphedema, not elsewhere classified: Secondary | ICD-10-CM | POA: Diagnosis not present

## 2019-10-19 DIAGNOSIS — E11622 Type 2 diabetes mellitus with other skin ulcer: Secondary | ICD-10-CM | POA: Insufficient documentation

## 2019-10-26 ENCOUNTER — Other Ambulatory Visit: Payer: Self-pay

## 2019-10-26 ENCOUNTER — Encounter (HOSPITAL_BASED_OUTPATIENT_CLINIC_OR_DEPARTMENT_OTHER): Payer: Medicare Other | Attending: Physician Assistant | Admitting: Physician Assistant

## 2019-10-26 DIAGNOSIS — Z7901 Long term (current) use of anticoagulants: Secondary | ICD-10-CM | POA: Diagnosis not present

## 2019-10-26 DIAGNOSIS — J449 Chronic obstructive pulmonary disease, unspecified: Secondary | ICD-10-CM | POA: Insufficient documentation

## 2019-10-26 DIAGNOSIS — L97812 Non-pressure chronic ulcer of other part of right lower leg with fat layer exposed: Secondary | ICD-10-CM | POA: Diagnosis not present

## 2019-10-26 DIAGNOSIS — I10 Essential (primary) hypertension: Secondary | ICD-10-CM | POA: Insufficient documentation

## 2019-10-26 DIAGNOSIS — I872 Venous insufficiency (chronic) (peripheral): Secondary | ICD-10-CM | POA: Diagnosis not present

## 2019-10-26 DIAGNOSIS — I890 Lymphedema, not elsewhere classified: Secondary | ICD-10-CM | POA: Insufficient documentation

## 2019-10-26 DIAGNOSIS — E11622 Type 2 diabetes mellitus with other skin ulcer: Secondary | ICD-10-CM | POA: Insufficient documentation

## 2019-10-26 DIAGNOSIS — I480 Paroxysmal atrial fibrillation: Secondary | ICD-10-CM | POA: Insufficient documentation

## 2019-10-26 NOTE — Progress Notes (Addendum)
SAMBA, CUMBA ([id number]) Visit Report for 10/26/2019 Chief Complaint Document Details Patient Name: Date of Service: [name], [name] 10/26/2019 12:45 PM Medical Record [id number] Patient Account Number: [id number] Date of Birth/Sex: Treating RN: 14-Nov-1945 (74 y.o. [name] Primary Care Provider: Cathlean [name] Other Clinician: Referring Provider: Treating Provider/Extender:[name], [name], [name] in Treatment: 1 Information Obtained from: Patient Chief Complaint Right LE Ulcers Electronic Signature(s) Signed: 10/26/2019 1:29:56 PM By: Worthy Keeler PA-C Entered By: Worthy Keeler on 10/26/2019 13:29:56 -------------------------------------------------------------------------------- HPI Details Patient Name: Date of Service: [name], [name] 10/26/2019 12:45 PM Medical Record [id number] Patient Account Number: [id number] Date of Birth/Sex: Treating RN: 1945-11-21 (74 y.o. [name] Primary Care Provider: Cathlean [name] Other Clinician: Referring Provider: Treating Provider/Extender:[name], [name], [name] in Treatment: 1 History of Present Illness HPI Description: 10/19/2019 upon evaluation today patient appears to be doing somewhat poorly upon initial evaluation here in our clinic. He tells me that he has really an undisclosed amount of time that he has been dealing with issues with his legs bilaterally. The left leg swells minimally the right leg has wounds and unfortunately does seem to swell much more significantly. Fortunately there is no evidence of active infection at this time. No fevers, chills, nausea, vomiting, or diarrhea. With that being said the patient tells me that he does have a history of diabetes with his most recent hemoglobin A1c being 7.1 that was on 10/06/2019. He has been using Neosporin over the area. He also has what appears to be lymphedema stage [name], chronic venous insufficiency, diabetes mellitus  type 2, hypertension, COPD, paroxysmal atrial fibrillation, and long-term use of anticoagulant therapy. The patient really does not know how long the wounds currently have been open although he tells me "months". 10/26/2019 on evaluation today patient appears to be doing excellent in regard to his right lower extremity. He just has a couple small areas remaining open at this point the majority of the wounds have all healed. Fortunately there is no signs of active infection. He does have a compression sock on the left lower extremity however the sock does have holes in it I think it is time for some new ones I think he would do well to get some from elastic therapy in [facility] as they will be much more inexpensive but at the same time excellent quality Electronic Signature(s) Signed: 10/26/2019 1:31:12 PM By: Worthy Keeler PA-C Entered By: Worthy Keeler on 10/26/2019 13:31:11 -------------------------------------------------------------------------------- Physical Exam Details Patient Name: Date of Service: [name], [name] 10/26/2019 12:45 PM Medical Record [id number] Patient Account Number: [id number] Date of Birth/Sex: Treating RN: 11-08-1945 (74 y.o. [name] Primary Care Provider: Cathlean [name] Other Clinician: Referring Provider: Treating Provider/Extender:[name], [name], [name] in Treatment: 1 Constitutional Well-nourished and well-hydrated in no acute distress. Respiratory normal breathing without difficulty. Psychiatric this patient is able to make decisions and demonstrates good insight into disease process. Alert and Oriented x 3. pleasant and cooperative. Notes Patient's wounds currently appear to be doing much better in regard to lymphedema of the right lower extremity. In general he is doing extremely well and I am very pleased with how things appear at this point. There is no signs of active infection at this time. Electronic  Signature(s) Signed: 10/26/2019 1:31:27 PM By: Worthy Keeler PA-C Entered By: Worthy Keeler on 10/26/2019 13:31:27 -------------------------------------------------------------------------------- Physician Orders Details Patient Name: Date of Service: [name], [name] 10/26/2019 12:45 PM Medical Record [id number] Patient Account  Number: [id number] Date of Birth/Sex: Treating RN: May 20, 1946 (74 y.o. [name] Primary Care Provider: Cathlean [name] Other Clinician: Referring Provider: Treating Provider/Extender:[name], [name], [name] in Treatment: 1 Verbal / Phone Orders: No Diagnosis Coding ICD-10 Coding Code Description I89.0 Lymphedema, not elsewhere classified I87.2 Venous insufficiency (chronic) (peripheral) L97.812 Non-pressure chronic ulcer of other part of right lower leg with fat layer exposed E11.622 Type 2 diabetes mellitus with other skin ulcer I10 Essential (primary) hypertension J44.9 Chronic obstructive pulmonary disease, unspecified I48.0 Paroxysmal atrial fibrillation Z79.01 Long term (current) use of anticoagulants Follow-up Appointments Return Appointment in 1 week. Dressing Change Frequency Do not change entire dressing for one week. Skin Barriers/Peri-Wound Care Moisturizing lotion - to right leg Wound Cleansing Wound #1 Right,Medial Lower Leg May shower with protection. Primary Wound Dressing Wound #1 Right,Medial Lower Leg Calcium Alginate with Silver Secondary Dressing Wound #1 Right,Medial Lower Leg Dry Gauze Edema Control 4 layer compression - Right Lower Extremity Avoid standing for long periods of time Elevate legs to the level of the heart or above for 30 minutes daily and/or when sitting, a frequency of: - throughout the day Exercise regularly Electronic Signature(s) Signed: 10/26/2019 6:07:56 PM By: Baruch Gouty RN, BSN Signed: 10/26/2019 7:02:55 PM By: Worthy Keeler PA-C Entered By: Baruch Gouty on  10/26/2019 13:26:07 -------------------------------------------------------------------------------- Problem List Details Patient Name: Date of Service: [name], [name] 10/26/2019 12:45 PM Medical Record [id number] Patient Account Number: [id number] Date of Birth/Sex: Treating RN: 04/03/1946 (74 y.o. [name] Primary Care Provider: Cathlean [name] Other Clinician: Referring Provider: Treating Provider/Extender:[name], [name], [name] in Treatment: 1 Active Problems ICD-10 Evaluated Encounter Code Description Active Date Today Diagnosis I89.0 Lymphedema, not elsewhere classified 10/19/2019 No Yes I87.2 Venous insufficiency (chronic) (peripheral) 10/19/2019 No Yes L97.812 Non-pressure chronic ulcer of other part of right lower 10/19/2019 No Yes leg with fat layer exposed E11.622 Type 2 diabetes mellitus with other skin ulcer 10/19/2019 No Yes I10 Essential (primary) hypertension 10/19/2019 No Yes J44.9 Chronic obstructive pulmonary disease, unspecified 10/19/2019 No Yes I48.0 Paroxysmal atrial fibrillation 10/19/2019 No Yes Z79.01 Long term (current) use of anticoagulants 10/19/2019 No Yes Inactive Problems Resolved Problems Electronic Signature(s) Signed: 10/26/2019 1:22:01 PM By: Worthy Keeler PA-C Entered By: Worthy Keeler on 10/26/2019 13:22:00 -------------------------------------------------------------------------------- Progress Note Details Patient Name: Date of Service: [name], [name] 10/26/2019 12:45 PM Medical Record [id number] Patient Account Number: [id number] Date of Birth/Sex: Treating RN: 12-May-1946 (74 y.o. [name] Primary Care Provider: Cathlean [name] Other Clinician: Referring Provider: Treating Provider/Extender:[name], [name], [name] in Treatment: 1 Subjective Chief Complaint Information obtained from Patient Right LE Ulcers History of Present Illness (HPI) 10/19/2019 upon evaluation today patient  appears to be doing somewhat poorly upon initial evaluation here in our clinic. He tells me that he has really an undisclosed amount of time that he has been dealing with issues with his legs bilaterally. The left leg swells minimally the right leg has wounds and unfortunately does seem to swell much more significantly. Fortunately there is no evidence of active infection at this time. No fevers, chills, nausea, vomiting, or diarrhea. With that being said the patient tells me that he does have a history of diabetes with his most recent hemoglobin A1c being 7.1 that was on 10/06/2019. He has been using Neosporin over the area. He also has what appears to be lymphedema stage [name], chronic venous insufficiency, diabetes mellitus type 2, hypertension, COPD, paroxysmal atrial fibrillation, and long-term use of anticoagulant therapy.  The patient really does not know how long the wounds currently have been open although he tells me "months". 10/26/2019 on evaluation today patient appears to be doing excellent in regard to his right lower extremity. He just has a couple small areas remaining open at this point the majority of the wounds have all healed. Fortunately there is no signs of active infection. He does have a compression sock on the left lower extremity however the sock does have holes in it I think it is time for some new ones I think he would do well to get some from elastic therapy in [facility] as they will be much more inexpensive but at the same time excellent quality Objective Constitutional Well-nourished and well-hydrated in no acute distress. Vitals Time Taken: 1:04 PM, Height: 73 in, Weight: 275 lbs, BMI: 36.3, Temperature: 97.7 F, Pulse: 46 bpm, Respiratory Rate: 18 breaths/min, Blood Pressure: 117/48 mmHg, Capillary Blood Glucose: 126 mg/dl. General Notes: glucose per pt report Respiratory normal breathing without difficulty. Psychiatric this patient is able to make  decisions and demonstrates good insight into disease process. Alert and Oriented x 3. pleasant and cooperative. General Notes: Patient's wounds currently appear to be doing much better in regard to lymphedema of the right lower extremity. In general he is doing extremely well and I am very pleased with how things appear at this point. There is no signs of active infection at this time. Integumentary (Hair, Skin) Wound #1 status is Open. Original cause of wound was Blister. The wound is located on the Right,Medial Lower Leg. The wound measures 1.5cm length x 2.5cm width x 0.1cm depth; 2.945cm^2 area and 0.295cm^3 volume. There is Fat Layer (Subcutaneous Tissue) Exposed exposed. There is no tunneling or undermining noted. There is a medium amount of serosanguineous drainage noted. The wound margin is fibrotic, thickened scar. There is large (67-100%) red granulation within the wound bed. There is no necrotic tissue within the wound bed. Wound #2 status is Healed - Epithelialized. Original cause of wound was Blister. The wound is located on the Right,Lateral Lower Leg. The wound measures 0cm length x 0cm width x 0cm depth; 0cm^2 area and 0cm^3 volume. There is no tunneling or undermining noted. There is a none present amount of drainage noted. The wound margin is flat and intact. There is no granulation within the wound bed. There is no necrotic tissue within the wound bed. Wound #3 status is Healed - Epithelialized. Original cause of wound was Blister. The wound is located on the Right,Lateral,Anterior Lower Leg. The wound measures 0cm length x 0cm width x 0cm depth; 0cm^2 area and 0cm^3 volume. There is no tunneling or undermining noted. There is a none present amount of drainage noted. The wound margin is flat and intact. There is no granulation within the wound bed. There is no necrotic tissue within the wound bed. Wound #4 status is Healed - Epithelialized. Original cause of wound was Blister.  The wound is located on the Right,Anterior Lower Leg. The wound measures 0cm length x 0cm width x 0cm depth; 0cm^2 area and 0cm^3 volume. There is no tunneling or undermining noted. There is a none present amount of drainage noted. The wound margin is flat and intact. There is no granulation within the wound bed. There is no necrotic tissue within the wound bed. Assessment Active Problems ICD-10 Lymphedema, not elsewhere classified Venous insufficiency (chronic) (peripheral) Non-pressure chronic ulcer of other part of right lower leg with fat layer exposed Type 2 diabetes  mellitus with other skin ulcer Essential (primary) hypertension Chronic obstructive pulmonary disease, unspecified Paroxysmal atrial fibrillation Long term (current) use of anticoagulants Procedures Wound #1 Pre-procedure diagnosis of Wound #1 is a Lymphedema located on the Right,Medial Lower Leg . There was a Four Layer Compression Therapy Procedure by Carlene Coria, RN. Post procedure Diagnosis Wound #1: Same as Pre-Procedure Plan Follow-up Appointments: Return Appointment in 1 week. Dressing Change Frequency: Do not change entire dressing for one week. Skin Barriers/Peri-Wound Care: Moisturizing lotion - to right leg Wound Cleansing: Wound #1 Right,Medial Lower Leg: May shower with protection. Primary Wound Dressing: Wound #1 Right,Medial Lower Leg: Calcium Alginate with Silver Secondary Dressing: Wound #1 Right,Medial Lower Leg: Dry Gauze Edema Control: 4 layer compression - Right Lower Extremity Avoid standing for long periods of time Elevate legs to the level of the heart or above for 30 minutes daily and/or when sitting, a frequency of: - throughout the day Exercise regularly 1. I would recommend currently that we continue with the silver alginate dressing I do believe this is been beneficial for him. He just really has 1 area remaining open at this point. 2. Also can recommend that we continue  with the 4 layer compression wrap to the right lower extremity that seem to be doing well as far as keeping his edema under control. 3. Also recommend he continue to exercise regularly as well as elevate his legs when he is sitting to help with edema control. We will see patient back for reevaluation in 1 week here in the clinic. If anything worsens or changes patient will contact our office for additional recommendations. Electronic Signature(s) Signed: 10/26/2019 1:31:59 PM By: Worthy Keeler PA-C Entered By: Worthy Keeler on 10/26/2019 13:31:59 -------------------------------------------------------------------------------- SuperBill Details Patient Name: Date of Service: [name], [name] 10/26/2019 Medical Record [id number] Patient Account Number: [id number] Date of Birth/Sex: Treating RN: Dec 07, 1945 (74 y.o. [name] Primary Care Provider: Cathlean [name] Other Clinician: Referring Provider: Treating Provider/Extender:[name], [name], [name] in Treatment: 1 Diagnosis Coding ICD-10 Codes Code Description I89.0 Lymphedema, not elsewhere classified I87.2 Venous insufficiency (chronic) (peripheral) L97.812 Non-pressure chronic ulcer of other part of right lower leg with fat layer exposed E11.622 Type 2 diabetes mellitus with other skin ulcer I10 Essential (primary) hypertension J44.9 Chronic obstructive pulmonary disease, unspecified I48.0 Paroxysmal atrial fibrillation Z79.01 Long term (current) use of anticoagulants Facility Procedures CPT4 Code Description: [id number] (Facility Use Only) [phone] - [place] [facility] RT LEG Modifier: Quantity: 1 Physician Procedures CPT4 Code Description: [id number] - WC PHYS LEVEL 3 - EST PT ICD-10 Diagnosis Description I89.0 Lymphedema, not elsewhere classified I87.2 Venous insufficiency (chronic) (peripheral) L97.812 Non-pressure chronic ulcer of other part of right lower  E11.622 Type 2 diabetes mellitus  with other skin ulcer Modifier: leg with fat la Quantity: 1 yer exposed Electronic Signature(s) Signed: 10/26/2019 1:32:18 PM By: Worthy Keeler PA-C Entered By: Worthy Keeler on 10/26/2019 13:32:17

## 2019-10-30 ENCOUNTER — Other Ambulatory Visit: Payer: Self-pay | Admitting: Cardiology

## 2019-10-30 ENCOUNTER — Other Ambulatory Visit: Payer: Self-pay | Admitting: Internal Medicine

## 2019-10-30 NOTE — Telephone Encounter (Signed)
Please refill as per office routine med refill policy (all routine meds refilled for 3 mo or monthly per pt preference up to one year from last visit, then month to month grace period for 3 mo, then further med refills will have to be denied)  

## 2019-10-31 NOTE — Progress Notes (Signed)
[name] ([id number]) Visit Report for 10/26/2019 Arrival Information Details Patient Name: Date of Service: [name], [name] 10/26/2019 12:45 PM Medical Record [name] Patient Account Number: [id number] Date of Birth/Sex: Treating RN: 1946-06-13 (74 y.o. [name] Primary Care [name]: [name] Other Clinician: Referring [name]: Treating [name], [name], [name] in Treatment: 1 Visit Information History Since Last Visit Added or deleted any medications: No Patient Arrived: Wheel Chair Any new allergies or adverse reactions: No Arrival Time: 13:03 Had a fall or experienced change in No Accompanied By: daughter activities of daily living that may affect Transfer Assistance: None risk of falls: Patient Identification Verified: Yes Signs or symptoms of abuse/neglect since last No Secondary Verification Process Yes visito Completed: Hospitalized since last visit: No Patient Requires Transmission- No Implantable device outside of the clinic excluding No Based Precautions: cellular tissue based products placed in the center Patient Has Alerts: Yes since last visit: Patient Alerts: Patient on Blood Has Dressing in Place as Prescribed: Yes Thinner Has Compression in Place as Prescribed: Yes Pain Present Now: No Electronic Signature(s) Signed: 10/31/2019 6:12:10 PM By: Parker Hurst RN, BSN Entered By: [name] on 10/26/2019 13:04:16 -------------------------------------------------------------------------------- Compression Therapy Details Patient Name: [name] Date of Service: 10/26/2019 12:45 PM Medical Record [id number] Patient Account Number: [id number] Date of Birth/Sex: Treating RN: May 07, 1946 (74 y.o. [name] Primary Care [name]: [name] Other Clinician: Referring Narayan Scull: Treating Nadia Torr/Extender:[name], [name], [name] in Treatment: 1 Compression  Therapy Performed for Wound Wound #1 Right,Medial Lower Leg Assessment: Performed By: Clinician Parker Coria, RN Compression Type: Four Layer Post Procedure Diagnosis Same as Pre-procedure Electronic Signature(s) Signed: 10/26/2019 6:07:56 PM By: Baruch Gouty RN, BSN Entered By: Baruch Gouty on 10/26/2019 13:25:19 -------------------------------------------------------------------------------- Encounter Discharge Information Details Patient Name: Date of Service: [name], [name] 10/26/2019 12:45 PM Medical Record [id number] Patient Account Number: [id number] Date of Birth/Sex: Treating RN: 1946/01/01 (73 y.o. [name] Primary Care [name]: [name] Other Clinician: Referring Blakley Michna: Treating [name]/Extender:[name], [name], [name] in Treatment: 1 Encounter Discharge Information Items Discharge Condition: Stable Ambulatory Status: Wheelchair Discharge Destination: Home Transportation: Private Auto Accompanied By: caregiver Schedule Follow-up Appointment: Yes Clinical Summary of Care: Patient Declined Electronic Signature(s) Signed: 10/26/2019 5:52:27 PM By: Parker Coria RN Entered By: [name] on 10/26/2019 13:46:12 -------------------------------------------------------------------------------- Lower Extremity Assessment Details Patient Name: Date of Service: [name], [name] 10/26/2019 12:45 PM Medical Record [id number] Patient Account Number: [id number] Date of Birth/Sex: Treating RN: 03-04-1946 (74 y.o. [name] Primary Care Sharaine Delange: [name] Other Clinician: Referring [name]: Treating [name]/Extender:[name], [name], [name] in Treatment: 1 Edema Assessment Assessed: [Left: No] [Right: No] Edema: [Left: Ye] [Right: s] Calf Left: Right: Point of Measurement: 37 cm From Medial Instep cm 35.5 cm Ankle Left: Right: Point of Measurement: 10 cm From Medial Instep cm 28.5 cm Vascular  Assessment Pulses: Dorsalis Pedis Palpable: [Right:Yes] Electronic Signature(s) Signed: 10/31/2019 6:12:10 PM By: Parker Hurst RN, BSN Entered By: [name] on 10/26/2019 13:16:54 -------------------------------------------------------------------------------- Multi-Disciplinary Care Plan Details Patient Name: Date of Service: [name] 10/26/2019 12:45 PM Medical Record [id number] Patient Account Number: [id number] Date of Birth/Sex: Treating RN: 07-Oct-1945 (74 y.o. [name] Primary Care [name]: [name] Other Clinician: Referring [name]: Treating [name]/Extender:[name], [name], [name] in Treatment: 1 Active Inactive Abuse / Safety / Falls / Self Care Management Nursing Diagnoses: Potential for falls Goals: Patient/caregiver will verbalize/demonstrate measures taken to prevent injury and/or falls Date Initiated: 10/19/2019 Target Resolution Date: 11/16/2019  Goal Status: Active Interventions: Assess fall risk on admission and as needed Assess impairment of mobility on admission and as needed per policy Notes: Nutrition Nursing Diagnoses: Impaired glucose control: actual or potential Potential for alteratiion in Nutrition/Potential for imbalanced nutrition Goals: Patient/caregiver will maintain therapeutic glucose control Date Initiated: 10/19/2019 Target Resolution Date: 11/16/2019 Goal Status: Active Interventions: Assess HgA1c results as ordered upon admission and as needed Assess patient nutrition upon admission and as needed per policy Provide education on elevated blood sugars and impact on wound healing Treatment Activities: Patient referred to Primary Care Physician for further nutritional evaluation : 10/19/2019 Notes: Venous Leg Ulcer Nursing Diagnoses: Knowledge deficit related to disease process and management Potential for venous Insuffiency (use before diagnosis confirmed) Goals: Patient will maintain  optimal edema control Date Initiated: 10/19/2019 Target Resolution Date: 11/16/2019 Goal Status: Active Interventions: Assess peripheral edema status every visit. Compression as ordered Provide education on venous insufficiency Treatment Activities: Therapeutic compression applied : 10/19/2019 Notes: Wound/Skin Impairment Nursing Diagnoses: Impaired tissue integrity Knowledge deficit related to ulceration/compromised skin integrity Goals: Patient/caregiver will verbalize understanding of skin care regimen Date Initiated: 10/19/2019 Target Resolution Date: 11/16/2019 Goal Status: Active Ulcer/skin breakdown will have a volume reduction of 30% by week 4 Date Initiated: 10/19/2019 Target Resolution Date: 11/16/2019 Goal Status: Active Interventions: Assess patient/caregiver ability to obtain necessary supplies Assess patient/caregiver ability to perform ulcer/skin care regimen upon admission and as needed Assess ulceration(s) every visit Provide education on ulcer and skin care Treatment Activities: Skin care regimen initiated : 10/19/2019 Topical wound management initiated : 10/19/2019 Notes: Electronic Signature(s) Signed: 10/26/2019 6:07:56 PM By: Baruch Gouty RN, BSN Entered By: Baruch Gouty on 10/26/2019 13:24:26 -------------------------------------------------------------------------------- Pain Assessment Details Patient Name: Date of Service: [name], [name] 10/26/2019 12:45 PM Medical Record [id number] Patient Account Number: [id number] Date of Birth/Sex: Treating RN: [birth year] (74 y.o. [name] Primary Care Ellen Goris: [name] Other Clinician: Referring Shernell Saldierna: Treating Elo Marmolejos/Extender:[name], [name], [name] in Treatment: 1 Active Problems Location of Pain Severity and Description of Pain Patient Has Paino No Site Locations Pain Management and Medication Current Pain Management: Electronic Signature(s) Signed: 10/31/2019  6:12:10 PM By: Parker Hurst RN, BSN Entered By: [name] on 10/26/2019 13:07:36 -------------------------------------------------------------------------------- Patient/Caregiver Education Details Ezzard Flax, 4/7/2021andnbsp12:45 Patient Name: Date of Service: [place] PM Medical Record Patient Account Number: [id number] Number: Treating RN: Baruch Gouty Date of Birth/Gender: Jan 26, 1946 (73 y.o. M) Other Clinician: Primary Care Physician:John, Geanie Kenning Worthy Keeler Referring Physician: Physician/Extender: Rogene Houston in Treatment: 1 Education Assessment Education Provided To: Patient Education Topics Provided Venous: Methods: Explain/Verbal Responses: Reinforcements needed, State content correctly Wound/Skin Impairment: Methods: Explain/Verbal Responses: Reinforcements needed, State content correctly Electronic Signature(s) Signed: 10/26/2019 6:07:56 PM By: Baruch Gouty RN, BSN Entered By: Baruch Gouty on 10/26/2019 13:24:54 -------------------------------------------------------------------------------- Wound Assessment Details Patient Name: Date of Service: [name], [name] 10/26/2019 12:45 PM Medical Record [id number] Patient Account Number: [id number] Date of Birth/Sex: Treating RN: 1946/06/07 (74 y.o. [name] Primary Care Ankita Newcomer: [name] Other Clinician: Referring Saron Vanorman: Treating Akela Pocius/Extender:[name], [name], [name] in Treatment: 1 Wound Status Wound Number: 1 Primary Lymphedema Etiology: Wound Location: Right, Medial Lower Leg Wound Open Wounding Event: Blister Status: Date Acquired: 07/22/2019 Comorbid Chronic Obstructive Pulmonary Disease Weeks Of Treatment: 1 History: (COPD), Arrhythmia, Hypertension, Type II Clustered Wound: No Diabetes, Osteoarthritis, Neuropathy Wound Measurements Length: (cm) 1.5 % Reduction Width: (cm) 2.5 % Reduction Depth: (cm) 0.1  Epitheliali Area: (cm) 2.945 Tunneling: Volume: (cm) 0.295 Underminin Wound Description Classification:  Full Thickness Without Exposed Support Foul Odo Structures Slough/F Wound Fibrotic scar, thickened scar Margin: Exudate Medium Amount: Exudate Serosanguineous Type: Exudate red, brown Color: Wound Bed Granulation Amount: Large (67-100%) Granulation Quality: Red Fascia E Necrotic Amount: None Present (0%) Fat Laye Tendon E Muscle E Joint Ex Bone Exp r After Cleansing: No ibrino No Exposed Structure xposed: No r (Subcutaneous Tissue) Exposed: Yes xposed: No xposed: No posed: No osed: No in Area: 81.3% in Volume: 81.2% zation: Medium (34-66%) No g: No Treatment Notes Wound #1 (Right, Medial Lower Leg) 1. Cleanse With Wound Cleanser Soap and water 2. Periwound Care Moisturizing lotion 3. Primary Dressing Applied Calcium Alginate Ag 4. Secondary Dressing Dry Gauze 6. Support Layer Applied 4 layer compression wrap Notes netting. Electronic Signature(s) Signed: 10/31/2019 6:12:10 PM By: Parker Hurst RN, BSN Entered By: [name] on 10/26/2019 13:17:43 -------------------------------------------------------------------------------- Wound Assessment Details Patient Name: Date of Service: [name], [name] 10/26/2019 12:45 PM Medical Record [id number] Patient Account Number: [id number] Date of Birth/Sex: Treating RN: 11-04-1945 (74 y.o. [name] Primary Care Lejon Afzal: [name] Other Clinician: Referring Darrill Vreeland: Treating Ledonna Dormer/Extender:[name], [name], [name] in Treatment: 1 Wound Status Wound Number: 2 Primary Lymphedema Etiology: Wound Location: Right, Lateral Lower Leg Wound Healed - Epithelialized Wounding Event: Blister Status: Date Acquired: 07/22/2019 Comorbid Chronic Obstructive Pulmonary Disease Weeks Of Treatment: 1 History: (COPD), Arrhythmia, Hypertension, Type II Clustered Wound: No Diabetes,  Osteoarthritis, Neuropathy Wound Measurements Length: (cm) 0 % Reduct Width: (cm) 0 % Reduct Depth: (cm) 0 Epitheli Area: (cm) 0 Tunneli Volume: (cm) 0 Undermi Wound Description Classification: Full Thickness Without Exposed Support Foul Odo Structures Slough/F Wound Flat and Intact Margin: Exudate None Present Amount: Wound Bed Granulation Amount: None Present (0%) Necrotic Amount: None Present (0%) Fascia E Fat Laye Tendon E Muscle E Joint Ex Bone Exp r After Cleansing: No ibrino No Exposed Structure xposed: No r (Subcutaneous Tissue) Exposed: No xposed: No xposed: No posed: No osed: No ion in Area: 100% ion in Volume: 100% alization: Large (67-100%) ng: No ning: No Electronic Signature(s) Signed: 10/31/2019 6:12:10 PM By: Parker Hurst RN, BSN Entered By: [name] on 10/26/2019 13:17:56 -------------------------------------------------------------------------------- Wound Assessment Details Patient Name: Date of Service: [name], [name] 10/26/2019 12:45 PM Medical Record [id number] Patient Account Number: [id number] Date of Birth/Sex: Treating RN: Nov 10, 1945 (74 y.o. [name] Primary Care Lamaj Metoyer: [name] Other Clinician: Referring Talyn Eddie: Treating Tatyana Biber/Extender:[name], [name], [name] in Treatment: 1 Wound Status Wound Number: 3 Primary Lymphedema Etiology: Wound Location: Right, Lateral, Anterior Lower Leg Wound Healed - Epithelialized Wounding Event: Blister Status: Date Acquired: 07/22/2019 Comorbid Chronic Obstructive Pulmonary Disease Weeks Of Treatment: 1 History: (COPD), Arrhythmia, Hypertension, Type II Clustered Wound: No Clustered Wound: No Diabetes, Osteoarthritis, Neuropathy Wound Measurements Length: (cm) 0 % Reduct Width: (cm) 0 % Reduct Depth: (cm) 0 Epitheli Area: (cm) 0 Tunneli Volume: (cm) 0 Undermi Wound Description Classification: Full Thickness Without Exposed Support Foul  Odo Structures Slough/F Wound Flat and Intact Margin: Exudate None Present Amount: Wound Bed Granulation Amount: None Present (0%) Necrotic Amount: None Present (0%) Fascia E Fat Laye Tendon E Muscle E Joint Ex Bone Exp r After Cleansing: No ibrino No Exposed Structure xposed: No r (Subcutaneous Tissue) Exposed: No xposed: No xposed: No posed: No osed: No ion in Area: 100% ion in Volume: 100% alization: Large (67-100%) ng: No ning: No Electronic Signature(s) Signed: 10/31/2019 6:12:10 PM By: Parker Hurst RN, BSN Entered By: [name] on 10/26/2019 13:18:10 -------------------------------------------------------------------------------- Wound Assessment  Details Patient Name: Date of Service: [name], [name] 10/26/2019 12:45 PM Medical Record [id number] Patient Account Number: [id number] Date of Birth/Sex: Treating RN: 1946/04/02 (74 y.o. [name] Primary Care Manning Luna: [name] Other Clinician: Referring Burlin Mcnair: Treating Diyari Cherne/Extender:[name], [name], [name] in Treatment: 1 Wound Status Wound Number: 4 Primary Lymphedema Etiology: Wound Location: Right, Anterior Lower Leg Wound Healed - Epithelialized Wounding Event: Blister Status: Date Acquired: 07/22/2019 Comorbid Chronic Obstructive Pulmonary Disease Weeks Of Treatment: 1 History: (COPD), Arrhythmia, Hypertension, Type II Clustered Wound: No Diabetes, Osteoarthritis, Neuropathy Wound Measurements Length: (cm) 0 % Reduct Width: (cm) 0 % Reduct Depth: (cm) 0 Epitheli Area: (cm) 0 Tunneli Volume: (cm) 0 Undermi Wound Description Classification: Full Thickness Without Exposed Support Foul Odo Structures Slough/F Wound Flat and Intact Margin: Exudate None Present Amount: Wound Bed Granulation Amount: None Present (0%) Necrotic Amount: None Present (0%) Fascia E Fat Laye Tendon E Muscle E Joint Ex Bone Exp r After Cleansing: No ibrino No Exposed  Structure xposed: No r (Subcutaneous Tissue) Exposed: No xposed: No xposed: No posed: No osed: No ion in Area: 100% ion in Volume: 100% alization: Large (67-100%) ng: No ning: No Electronic Signature(s) Signed: 10/31/2019 6:12:10 PM By: Parker Hurst RN, BSN Entered By: [name] on 10/26/2019 13:18:23 -------------------------------------------------------------------------------- Vitals Details Patient Name: Date of Service: [name], [name] 10/26/2019 12:45 PM Medical Record [id number] Patient Account Number: [id number] Date of Birth/Sex: Treating RN: 1946/06/17 (74 y.o. [name] Primary Care Carsten Carstarphen: [name] Other Clinician: Referring Maralyn Witherell: Treating Tequita Marrs/Extender:[name], [name], [name] in Treatment: 1 Vital Signs Time Taken: 13:04 Temperature (F): 97.7 Height (in): 73 Pulse (bpm): 46 Weight (lbs): 275 Respiratory Rate (breaths/min): 18 Body Mass Index (BMI): 36.3 Blood Pressure (mmHg): 117/48 Capillary Blood Glucose (mg/dl): 126 Reference Range: 80 - 120 mg / dl Notes glucose per pt report Electronic Signature(s) Signed: 10/31/2019 6:12:10 PM By: Parker Hurst RN, BSN Entered By: [name] on 10/26/2019 13:07:32

## 2019-11-02 ENCOUNTER — Encounter (HOSPITAL_BASED_OUTPATIENT_CLINIC_OR_DEPARTMENT_OTHER): Payer: Medicare Other | Admitting: Physician Assistant

## 2019-11-02 ENCOUNTER — Other Ambulatory Visit: Payer: Self-pay

## 2019-11-02 DIAGNOSIS — I890 Lymphedema, not elsewhere classified: Secondary | ICD-10-CM | POA: Diagnosis not present

## 2019-11-02 DIAGNOSIS — E11622 Type 2 diabetes mellitus with other skin ulcer: Secondary | ICD-10-CM | POA: Diagnosis not present

## 2019-11-02 DIAGNOSIS — L97812 Non-pressure chronic ulcer of other part of right lower leg with fat layer exposed: Secondary | ICD-10-CM | POA: Diagnosis not present

## 2019-11-02 DIAGNOSIS — I10 Essential (primary) hypertension: Secondary | ICD-10-CM | POA: Diagnosis not present

## 2019-11-02 DIAGNOSIS — Z7901 Long term (current) use of anticoagulants: Secondary | ICD-10-CM | POA: Diagnosis not present

## 2019-11-02 DIAGNOSIS — J449 Chronic obstructive pulmonary disease, unspecified: Secondary | ICD-10-CM | POA: Diagnosis not present

## 2019-11-02 DIAGNOSIS — I872 Venous insufficiency (chronic) (peripheral): Secondary | ICD-10-CM | POA: Diagnosis not present

## 2019-11-02 DIAGNOSIS — I480 Paroxysmal atrial fibrillation: Secondary | ICD-10-CM | POA: Diagnosis not present

## 2019-11-02 NOTE — Progress Notes (Signed)
PRIMO, INNIS ([id number]) Visit Report for 10/19/2019 Allergy List Details Patient Name: Date of Service: [name], [name] 10/19/2019 10:30 AM Medical Record [id number] Patient Account Number: [id number] Date of Birth/Sex: Treating RN: 1945-12-03 (74 y.o. [name] Primary Care Malayasia Mirkin: Cathlean Cower Other Clinician: Referring Shelene Krage: Treating Marleah Beever/Extender:Stone III, Basilia Jumbo, Casper Harrison in Treatment: 0 Allergies Active Allergies ciprofloxacin Reaction: generalized weakness Severity: Moderate Allergy Notes Electronic Signature(s) Signed: 11/02/2019 9:21:08 AM By: Sandre Kitty Entered By: Sandre Kitty on 10/19/2019 11:18:35 -------------------------------------------------------------------------------- Arrival Information Details Patient Name: Date of Service: [name], [name] 10/19/2019 10:30 AM Medical Record [id number] Patient Account Number: [id number] Date of Birth/Sex: Treating RN: 1946/04/07 (74 y.o. [name] Primary Care Dorraine Ellender: Cathlean Cower Other Clinician: Referring Tavon Corriher: Treating Elgar Scoggins/Extender:Stone III, Basilia Jumbo, Casper Harrison in Treatment: 0 Visit Information Patient Arrived: Wheel Chair Arrival Time: 11:12 Accompanied By: daughter Transfer Assistance: None Patient Identification Verified: Yes Secondary Verification Process Yes Completed: Patient Requires Transmission- No Based Precautions: Patient Has Alerts: Yes Patient Alerts: Patient on Blood Thinner Electronic Signature(s) Signed: 11/02/2019 9:21:08 AM By: Sandre Kitty Entered By: Sandre Kitty on 10/19/2019 11:16:09 -------------------------------------------------------------------------------- Clinic Level of Care Assessment Details Patient Name: Date of Service: [name], [name] 10/19/2019 10:30 AM Medical Record [id number] Patient Account Number: [id number] Date of Birth/Sex: Treating RN: Apr 29, 1946 (74 y.o.  [name] Primary Care Juda Lajeunesse: Cathlean Cower Other Clinician: Referring Lashonna Rieke: Treating Tonja Jezewski/Extender:Stone III, Basilia Jumbo, Casper Harrison in Treatment: 0 Clinic Level of Care Assessment Items TOOL 1 Quantity Score []  - Use when EandM and Procedure is performed on INITIAL visit 0 ASSESSMENTS - Nursing Assessment / Reassessment X - General Physical Exam (combine w/ comprehensive assessment (listed just below) 1 20 when performed on new pt. evals) X - Comprehensive Assessment (HX, ROS, Risk Assessments, Wounds Hx, etc.) 1 25 ASSESSMENTS - Wound and Skin Assessment / Reassessment []  - Dermatologic / Skin Assessment (not related to wound area) 0 ASSESSMENTS - Ostomy and/or Continence Assessment and Care []  - Incontinence Assessment and Management 0 []  - Ostomy Care Assessment and Management (repouching, etc.) 0 PROCESS - Coordination of Care X - Simple Patient / Family Education for ongoing care 1 15 []  - Complex (extensive) Patient / Family Education for ongoing care 0 X - Staff obtains Programmer, systems, Records, Test Results / Process Orders 1 10 []  - Staff telephones HHA, Nursing Homes / Clarify orders / etc 0 []  - Routine Transfer to another Facility (non-emergent condition) 0 []  - Routine Hospital Admission (non-emergent condition) 0 X - New Admissions / Biomedical engineer / Ordering NPWT, Apligraf, etc. 1 15 []  - Emergency Hospital Admission (emergent condition) 0 PROCESS - Special Needs []  - Pediatric / Minor Patient Management 0 []  - Isolation Patient Management 0 []  - Hearing / Language / Visual special needs 0 []  - Assessment of Community assistance (transportation, D/C planning, etc.) 0 []  - Additional assistance / Altered mentation 0 []  - Support Surface(s) Assessment (bed, cushion, seat, etc.) 0 INTERVENTIONS - Miscellaneous []  - External ear exam 0 []  - Patient Transfer (multiple staff / Civil Service fast streamer / Similar devices) 0 []  - Simple Staple / Suture removal (25  or less) 0 []  - Complex Staple / Suture removal (26 or more) 0 []  - Hypo/Hyperglycemic Management (do not check if billed separately) 0 X - Ankle / Brachial Index (ABI) - do not check if billed separately 1 15 Has the patient been seen at the hospital within the last three years: Yes Total Score: 100 Level Of Care: New/Established - Level  3 Electronic Signature(s) Signed: 10/19/2019 6:50:57 PM By: Baruch Gouty RN, BSN Entered By: Baruch Gouty on 10/19/2019 12:19:32 -------------------------------------------------------------------------------- Compression Therapy Details Patient Name: Date of Service: [name], [name] 10/19/2019 10:30 AM Medical Record [id number] Patient Account Number: [id number] Date of Birth/Sex: Treating RN: 04-Aug-1945 (73 y.o. [name] Primary Care Ashly Goethe: Cathlean Cower Other Clinician: Referring Cyndra Feinberg: Treating Kieryn Burtis/Extender:Stone III, Basilia Jumbo, Casper Harrison in Treatment: 0 Compression Therapy Performed for Wound Wound #1 Right,Medial Lower Leg Assessment: Performed By: Clinician Carlene Coria, RN Compression Type: Four Layer Post Procedure Diagnosis Same as Pre-procedure Electronic Signature(s) Signed: 10/19/2019 6:50:57 PM By: Baruch Gouty RN, BSN Entered By: Baruch Gouty on 10/19/2019 12:19:52 -------------------------------------------------------------------------------- Encounter Discharge Information Details Patient Name: Date of Service: [name], [name] 10/19/2019 10:30 AM Medical Record [id number] Patient Account Number: [id number] Date of Birth/Sex: Treating RN: 29-May-1946 (74 y.o. Hessie Diener Primary Care Randolf Sansoucie: Cathlean Cower Other Clinician: Referring Brei Pociask: Treating Zuly Belkin/Extender:Stone III, Basilia Jumbo, Casper Harrison in Treatment: 0 Encounter Discharge Information Items Discharge Condition: Stable Ambulatory Status: Wheelchair Discharge Destination: Home Transportation: Private  Auto Accompanied By: daughter Schedule Follow-up Appointment: Yes Clinical Summary of Care: Electronic Signature(s) Signed: 10/19/2019 5:08:19 PM By: Deon Pilling Entered By: Deon Pilling on 10/19/2019 12:47:51 -------------------------------------------------------------------------------- Lower Extremity Assessment Details Patient Name: Date of Service: [name], [name] 10/19/2019 10:30 AM Medical Record [id number] Patient Account Number: [id number] Date of Birth/Sex: Treating RN: 11/27/1945 (74 y.o. [name] Primary Care Rifka Ramey: Cathlean Cower Other Clinician: Referring Lorae Roig: Treating Winnie Umali/Extender:Stone III, Basilia Jumbo, Casper Harrison in Treatment: 0 Edema Assessment Assessed: [Left: No] [Right: No] Edema: [Left: Ye] [Right: s] Calf Left: Right: Point of Measurement: 37 cm From Medial Instep cm 45 cm Ankle Left: Right: Point of Measurement: 10 cm From Medial Instep cm 33 cm Vascular Assessment Pulses: Dorsalis Pedis Palpable: [Right:Yes] Blood Pressure: Brachial: [Right:122] Ankle: [Right:Dorsalis Pedis: 130 1.07] Electronic Signature(s) Signed: 10/19/2019 6:50:57 PM By: Baruch Gouty RN, BSN Signed: 11/02/2019 9:21:08 AM By: Sandre Kitty Entered By: Sandre Kitty on 10/19/2019 11:43:54 -------------------------------------------------------------------------------- Multi-Disciplinary Care Plan Details Patient Name: Date of Service: [name], [name] 10/19/2019 10:30 AM Medical Record [id number] Patient Account Number: [id number] Date of Birth/Sex: Treating RN: 06/10/1946 (74 y.o. [name] Primary Care Nelissa Bolduc: Cathlean Cower Other Clinician: Referring Nakira Litzau: Treating Kasha Howeth/Extender:Stone III, Basilia Jumbo, Casper Harrison in Treatment: 0 Active Inactive Abuse / Safety / Falls / Self Care Management Nursing Diagnoses: Potential for falls Goals: Patient/caregiver will verbalize/demonstrate measures taken to prevent  injury and/or falls Date Initiated: 10/19/2019 Target Resolution Date: 11/16/2019 Goal Status: Active Interventions: Assess fall risk on admission and as needed Assess impairment of mobility on admission and as needed per policy Notes: Nutrition Nursing Diagnoses: Impaired glucose control: actual or potential Potential for alteratiion in Nutrition/Potential for imbalanced nutrition Goals: Patient/caregiver will maintain therapeutic glucose control Date Initiated: 10/19/2019 Target Resolution Date: 11/16/2019 Goal Status: Active Interventions: Assess HgA1c results as ordered upon admission and as needed Assess patient nutrition upon admission and as needed per policy Provide education on elevated blood sugars and impact on wound healing Treatment Activities: Patient referred to Primary Care Physician for further nutritional evaluation : 10/19/2019 Notes: Venous Leg Ulcer Nursing Diagnoses: Knowledge deficit related to disease process and management Potential for venous Insuffiency (use before diagnosis confirmed) Goals: Patient will maintain optimal edema control Date Initiated: 10/19/2019 Target Resolution Date: 11/16/2019 Goal Status: Active Interventions: Assess peripheral edema status every visit. Compression as ordered Provide education on venous insufficiency Treatment Activities: Therapeutic compression applied : 10/19/2019 Notes: Wound/Skin Impairment Nursing  Diagnoses: Impaired tissue integrity Knowledge deficit related to ulceration/compromised skin integrity Goals: Patient/caregiver will verbalize understanding of skin care regimen Date Initiated: 10/19/2019 Target Resolution Date: 11/16/2019 Goal Status: Active Ulcer/skin breakdown will have a volume reduction of 30% by week 4 Date Initiated: 10/19/2019 Target Resolution Date: 11/16/2019 Goal Status: Active Interventions: Assess patient/caregiver ability to obtain necessary supplies Assess patient/caregiver  ability to perform ulcer/skin care regimen upon admission and as needed Assess ulceration(s) every visit Provide education on ulcer and skin care Treatment Activities: Skin care regimen initiated : 10/19/2019 Topical wound management initiated : 10/19/2019 Notes: Electronic Signature(s) Signed: 10/19/2019 6:50:57 PM By: Baruch Gouty RN, BSN Entered By: Baruch Gouty on 10/19/2019 12:18:16 -------------------------------------------------------------------------------- Pain Assessment Details Patient Name: Date of Service: [name], [name] 10/19/2019 10:30 AM Medical Record [id number] Patient Account Number: [id number] Date of Birth/Sex: Treating RN: March 25, 1946 (74 y.o. [name] Primary Care Cassi Jenne: Cathlean Cower Other Clinician: Referring Eliyanah Elgersma: Treating Delancey Moraes/Extender:Stone III, Basilia Jumbo, Casper Harrison in Treatment: 0 Active Problems Location of Pain Severity and Description of Pain Patient Has Paino No Site Locations Pain Management and Medication Current Pain Management: Electronic Signature(s) Signed: 10/19/2019 6:50:57 PM By: Baruch Gouty RN, BSN Signed: 11/02/2019 9:21:08 AM By: Sandre Kitty Entered By: Sandre Kitty on 10/19/2019 11:49:23 -------------------------------------------------------------------------------- Patient/Caregiver Education Details [name], 3/31/2021andnbsp10:30 Patient Name: Date of Service: Sarim AM Medical Record Patient Account Number: [id number] Number: Treating RN: Baruch Gouty Date of Birth/Gender: [birth year] (73 y.o. M) Other Clinician: Primary Care Treating Jenny Reichmann Luretha Rued Physician: Physician/Extender: Referring Physician: Rogene Houston in Treatment: 0 Education Assessment Education Provided To: Patient Education Topics Provided Elevated Blood Sugar/ Impact on Healing: Handouts: Elevated Blood Sugars: How Do They Affect Wound Healing Methods:  Explain/Verbal, Printed Responses: Reinforcements needed, State content correctly Venous: Controlling Swelling with Multilayered Compression Wraps, Managing Venous Disease and Related Handouts: Ulcers Methods: Explain/Verbal, Printed Responses: Reinforcements needed, State content correctly Welcome [facility]: Handouts: [facility] Methods: Explain/Verbal, Printed Responses: Reinforcements needed, State content correctly Electronic Signature(s) Signed: 10/19/2019 6:50:57 PM By: Baruch Gouty RN, BSN Entered By: Baruch Gouty on 10/19/2019 12:19:01 -------------------------------------------------------------------------------- Wound Assessment Details Patient Name: Date of Service: [name], [name] 10/19/2019 10:30 AM Medical Record [id number] Patient Account Number: [id number] Date of Birth/Sex: Treating RN: 06/08/1946 (74 y.o. [name] Primary Care Zarrah Loveland: Cathlean Cower Other Clinician: Referring Faith Branan: Treating Emon Miggins/Extender:Stone III, Basilia Jumbo, Casper Harrison in Treatment: 0 Wound Status Wound Number: 1 Primary Lymphedema Etiology: Wound Location: Right Lower Leg - Medial Wound Open Wounding Event: Blister Status: Date Acquired: 07/22/2019 Date Acquired: 07/22/2019 Comorbid Chronic Obstructive Pulmonary Disease Weeks Of Treatment: 0 History: (COPD), Arrhythmia, Hypertension, Type II Clustered Wound: No Diabetes, Osteoarthritis, Neuropathy Wound Measurements Length: (cm) 4 % Reduct Width: (cm) 5 % Reduct Depth: (cm) 0.1 Epitheli Area: (cm) 15.708 Tunneli Volume: (cm) 1.571 Undermi Wound Description Classification: Full Thickness Without Exposed Support Foul Odo Structures Slough/F Wound Fibrotic scar, thickened scar Margin: Exudate Medium Amount: Exudate Serosanguineous Type: Exudate red, brown Color: Wound Bed Granulation Amount: Large (67-100%) Granulation Quality: Pink Fascia E Necrotic  Amount: None Present (0%) Fat Laye Tendon E Muscle E Joint Ex Bone Exp r After Cleansing: No ibrino No Exposed Structure xposed: No r (Subcutaneous Tissue) Exposed: Yes xposed: No xposed: No posed: No osed: No ion in Area: ion in Volume: alization: None ng: No ning: No Electronic Signature(s) Signed: 10/19/2019 6:50:57 PM By: Baruch Gouty RN, BSN Signed: 11/02/2019 9:21:08 AM By: Sandre Kitty Entered By:  Sandre Kitty on 10/19/2019 11:45:57 -------------------------------------------------------------------------------- Wound Assessment Details Patient Name: Date of Service: [name], [name] 10/19/2019 10:30 AM Medical Record [id number] Patient Account Number: [id number] Date of Birth/Sex: Treating RN: March 23, 1946 (74 y.o. [name] Primary Care Rogena Deupree: Cathlean Cower Other Clinician: Referring Aisa Schoeppner: Treating Amous Crewe/Extender:Stone III, Basilia Jumbo, Casper Harrison in Treatment: 0 Wound Status Wound Number: 2 Primary Lymphedema Etiology: Wound Location: Right Lower Leg - Lateral Wound Open Wounding Event: Blister Status: Date Acquired: 07/22/2019 ComorbidChronic Obstructive Pulmonary Disease Weeks Of Treatment: 0 Weeks Of Treatment: 0 History: (COPD), Arrhythmia, Hypertension, Type II Clustered Wound: No Diabetes, Osteoarthritis, Neuropathy Wound Measurements Length: (cm) 0.9 % Reduc Width: (cm) 0.4 % Reduc Depth: (cm) 0.1 Epithel Area: (cm) 0.283 Tunnel Volume: (cm) 0.028 Underm Wound Description Full Thickness Without Exposed Support Foul O Classification: Structures Slough Wound Flat and Intact Margin: Exudate Medium Amount: Exudate Serosanguineous Type: Exudate red, brown Color: Wound Bed Granulation Amount: Medium (34-66%) Granulation Quality: Pink Fascia Necrotic Amount: Medium (34-66%) Fat Lay Necrotic Quality: Adherent Slough Tendon Muscle Joint E Bone Ex dor After Cleansing: No /Fibrino Yes Exposed  Structure Exposed: No er (Subcutaneous Tissue) Exposed: Yes Exposed: No Exposed: No xposed: No posed: No tion in Area: tion in Volume: ialization: None ing: No ining: No Electronic Signature(s) Signed: 10/19/2019 6:50:57 PM By: Baruch Gouty RN, BSN Signed: 11/02/2019 9:21:08 AM By: Sandre Kitty Entered By: Sandre Kitty on 10/19/2019 11:47:11 -------------------------------------------------------------------------------- Wound Assessment Details Patient Name: Date of Service: [name], [name] 10/19/2019 10:30 AM Medical Record [id number] Patient Account Number: [id number] Date of Birth/Sex: Treating RN: [birth year] (74 y.o. [name] Primary Care Destan Franchini: Cathlean Cower Other Clinician: Referring Tiya Schrupp: Treating Romain Erion/Extender:Stone III, Basilia Jumbo, Casper Harrison in Treatment: 0 Wound Status Wound Number: 3 Primary Lymphedema Etiology: Wound Location: Right Lower Leg - Lateral, Anterior Wound Open Wounding Event: Blister Status: Date Acquired: 07/22/2019 Comorbid Chronic Obstructive Pulmonary Disease Weeks Of Treatment: 0 History: (COPD), Arrhythmia, Hypertension, Type II Clustered Wound: No Clustered Wound: No Diabetes, Osteoarthritis, Neuropathy Wound Measurements Length: (cm) 1.5 % Reduct Width: (cm) 1.5 % Reduct Depth: (cm) 0.1 Epitheli Area: (cm) 1.767 Tunneli Volume: (cm) 0.177 Undermi Wound Description Classification: Full Thickness Without Exposed Support Foul Od Structures Slough/ Wound Flat and Intact Margin: Exudate Medium Amount: Exudate Serosanguineous Type: Exudate red, brown Color: Wound Bed Granulation Amount: Medium (34-66%) Granulation Quality: Pink Fascia E Necrotic Amount: Medium (34-66%) Fat Laye Necrotic Quality: Adherent Slough Tendon E Muscle E Joint Ex Bone Exp or After Cleansing: No Fibrino Yes Exposed Structure xposed: No r (Subcutaneous Tissue) Exposed: Yes xposed: No xposed: No posed:  No osed: No ion in Area: ion in Volume: alization: None ng: No ning: No Electronic Signature(s) Signed: 10/19/2019 6:50:57 PM By: Baruch Gouty RN, BSN Signed: 11/02/2019 9:21:08 AM By: Sandre Kitty Entered By: Sandre Kitty on 10/19/2019 11:48:22 -------------------------------------------------------------------------------- Wound Assessment Details Patient Name: Date of Service: OJAS, COONE 10/19/2019 10:30 AM Medical Record [id number] Patient Account Number: [id number] Date of Birth/Sex: Treating RN: 1946-01-15 (74 y.o. [name] Primary Care Bobbette Eakes: Cathlean Cower Other Clinician: Referring Colburn Asper: Treating Verna Hamon/Extender:Stone III, Basilia Jumbo, Casper Harrison in Treatment: 0 Wound Status Wound Number: 4 Primary Lymphedema Etiology: Wound Location: Right Lower Leg - Anterior Wound Open Wounding Event: Blister Status: Date Acquired: 07/22/2019 Comorbid Chronic Obstructive Pulmonary Disease Weeks Of Treatment: 0 History: (COPD), Arrhythmia, Hypertension, Type II Clustered Wound: No Diabetes, Osteoarthritis, Neuropathy Photos Photo Uploaded By: Mikeal Hawthorne on 10/24/2019 14:07:47 Wound Measurements Length: (cm) 1 % Reduct Width: (cm) 1 % Reduct  Depth: (cm) 0.1 Epitheli Area: (cm) 0.785 Tunneli Volume: (cm) 0.079 Undermi Wound Description Classification: Full Thickness Without Exposed Support Foul Od Structures Slough/ Wound Flat and Intact Margin: Exudate Small Amount: Exudate Serosanguineous Type: Exudate red, brown Color: Wound Bed Granulation Amount: Large (67-100%) Granulation Quality: Red Fascia E Necrotic Amount: None Present (0%) Fat Laye Tendon E Muscle E Joint Ex Bone Exp or After Cleansing: No Fibrino No Exposed Structure xposed: No r (Subcutaneous Tissue) Exposed: Yes xposed: No xposed: No posed: No osed: No ion in Area: ion in Volume: alization: None ng: No ning: No Electronic  Signature(s) Signed: 10/19/2019 6:50:57 PM By: Baruch Gouty RN, BSN Signed: 11/02/2019 9:21:08 AM By: Sandre Kitty Entered By: Sandre Kitty on 10/19/2019 11:49:14 -------------------------------------------------------------------------------- Vitals Details Patient Name: Date of Service: MALOSI, HEMSTREET 10/19/2019 10:30 AM Medical Record [id number] Patient Account Number: [id number] Date of Birth/Sex: Treating RN: Mar 14, 1946 (74 y.o. [name] Primary Care Amsi Grimley: Cathlean Cower Other Clinician: Referring Dailey Buccheri: Treating Graeson Nouri/Extender:Stone III, Basilia Jumbo, Casper Harrison in Treatment: 0 Vital Signs Time Taken: 11:16 Temperature (F): 98.2 Height (in): 73 Pulse (bpm): 50 Source: Stated Respiratory Rate (breaths/min): 18 Weight (lbs): 275 Blood Pressure (mmHg): 122/55 Source: Stated Capillary Blood Glucose (mg/dl): 118 Body Mass Index (BMI): 36.3 Reference Range: 80 - 120 mg / dl Notes glucose per pt report Electronic Signature(s) Signed: 11/02/2019 9:21:08 AM By: Sandre Kitty Entered By: Sandre Kitty on 10/19/2019 11:17:16

## 2019-11-02 NOTE — Progress Notes (Addendum)
PAITON, FOSCO ([id number]) Visit Report for 11/02/2019 Chief Complaint Document Details Patient Name: Date of Service: [name], [name] 11/02/2019 1:45 PM Medical Record [id number] Patient Account Number: [id number] Date of Birth/Sex: Treating RN: May 01, 1946 (74 y.o. [name] Primary Care Provider: Cathlean Cower Other Clinician: Referring Provider: Treating Provider/Extender:Stone III, Basilia Jumbo, Casper Harrison in Treatment: 2 Information Obtained from: Patient Chief Complaint Right LE Ulcers Electronic Signature(s) Signed: 11/02/2019 2:17:45 PM By: Worthy Keeler PA-C Entered By: Worthy Keeler on 11/02/2019 14:17:45 -------------------------------------------------------------------------------- HPI Details Patient Name: Date of Service: [name] 11/02/2019 1:45 PM Medical Record [id number] Patient Account Number: [id number] Date of Birth/Sex: Treating RN: June 27, 1946 (74 y.o. [name] Primary Care Provider: Cathlean Cower Other Clinician: Referring Provider: Treating Provider/Extender:Stone III, Basilia Jumbo, Casper Harrison in Treatment: 2 History of Present Illness HPI Description: 10/19/2019 upon evaluation today patient appears to be doing somewhat poorly upon initial evaluation here in our clinic. He tells me that he has really an undisclosed amount of time that he has been dealing with issues with his legs bilaterally. The left leg swells minimally the right leg has wounds and unfortunately does seem to swell much more significantly. Fortunately there is no evidence of active infection at this time. No fevers, chills, nausea, vomiting, or diarrhea. With that being said the patient tells me that he does have a history of diabetes with his most recent hemoglobin A1c being 7.1 that was on 10/06/2019. He has been using Neosporin over the area. He also has what appears to be lymphedema stage III, chronic venous insufficiency, diabetes  mellitus type 2, hypertension, COPD, paroxysmal atrial fibrillation, and long-term use of anticoagulant therapy. The patient really does not know how long the wounds currently have been open although he tells me "months". 10/26/2019 on evaluation today patient appears to be doing excellent in regard to his right lower extremity. He just has a couple small areas remaining open at this point the majority of the wounds have all healed. Fortunately there is no signs of active infection. He does have a compression sock on the left lower extremity however the sock does have holes in it I think it is time for some new ones I think he would do well to get some from elastic therapy in [facility] as they will be much more inexpensive but at the same time excellent quality 11/02/2019 upon evaluation today patient appears to be doing very well with regard to his right lower extremity. Fortunately there is no evidence of active infection which is good news. Overall I think that he has done extremely well at this point. Electronic Signature(s) Signed: 11/02/2019 2:40:22 PM By: Worthy Keeler PA-C Entered By: Worthy Keeler on 11/02/2019 14:40:22 -------------------------------------------------------------------------------- Physical Exam Details Patient Name: Date of Service: [name] 11/02/2019 1:45 PM Medical Record [id number] Patient Account Number: [id number] Date of Birth/Sex: Treating RN: 02/24/1946 (74 y.o. [name] Primary Care Provider: Cathlean Cower Other Clinician: Referring Provider: Treating Provider/Extender:Stone III, Basilia Jumbo, Casper Harrison in Treatment: 2 Constitutional Well-nourished and well-hydrated in no acute distress. Respiratory normal breathing without difficulty. Psychiatric this patient is able to make decisions and demonstrates good insight into disease process. Alert and Oriented x 3. pleasant and cooperative. Notes Patient's wound  bed currently showed signs of complete epithelization there is no sign of open wounds at this time which is good news and overall I am extremely happy with the fact that he healed so quickly this is great. Electronic Signature(s) Signed:  11/02/2019 2:40:35 PM By: Worthy Keeler PA-C Entered By: Worthy Keeler on 11/02/2019 14:40:34 -------------------------------------------------------------------------------- Physician Orders Details Patient Name: Date of Service: [name] 11/02/2019 1:45 PM Medical Record [id number] Patient Account Number: [id number] Date of Birth/Sex: Treating RN: [birth year] (74 y.o. [name] Primary Care Provider: Cathlean Cower Other Clinician: Referring Provider: Treating Provider/Extender:Stone III, Basilia Jumbo, Casper Harrison in Treatment: 2 Verbal / Phone Orders: No Diagnosis Coding ICD-10 Coding Code Description I89.0 Lymphedema, not elsewhere classified I87.2 Venous insufficiency (chronic) (peripheral) L97.812 Non-pressure chronic ulcer of other part of right lower leg with fat layer exposed E11.622 Type 2 diabetes mellitus with other skin ulcer I10 Essential (primary) hypertension J44.9 Chronic obstructive pulmonary disease, unspecified I48.0 Paroxysmal atrial fibrillation Z79.01 Long term (current) use of anticoagulants Discharge From [facility] Services Discharge from [place] lotion - to right leg Edema Control Elevate legs to the level of the heart or above for 30 minutes daily and/or when sitting, a frequency of: Exercise regularly Support Garment 20-30 mm/Hg pressure to: - both legs daily Electronic Signature(s) Signed: 11/02/2019 4:44:47 PM By: Worthy Keeler PA-C Signed: 11/02/2019 5:43:24 PM By: Baruch Gouty RN, BSN Entered By: Baruch Gouty on 11/02/2019 14:39:11 -------------------------------------------------------------------------------- Problem List  Details Patient Name: Date of Service: [name] 11/02/2019 1:45 PM Medical Record [id number] Patient Account Number: [id number] Date of Birth/Sex: Treating RN: June 25, 1946 (73 y.o. [name] Primary Care Provider: Cathlean Cower Other Clinician: Referring Provider: Treating Provider/Extender:Stone III, Basilia Jumbo, Casper Harrison in Treatment: 2 Active Problems ICD-10 Evaluated Encounter Code Description Active Date Today Diagnosis I89.0 Lymphedema, not elsewhere classified 10/19/2019 No Yes I87.2 Venous insufficiency (chronic) (peripheral) 10/19/2019 No Yes L97.812 Non-pressure chronic ulcer of other part of right lower 10/19/2019 No Yes leg with fat layer exposed E11.622 Type 2 diabetes mellitus with other skin ulcer 10/19/2019 No Yes I10 Essential (primary) hypertension 10/19/2019 No Yes J44.9 Chronic obstructive pulmonary disease, unspecified 10/19/2019 No Yes I48.0 Paroxysmal atrial fibrillation 10/19/2019 No Yes Z79.01 Long term (current) use of anticoagulants 10/19/2019 No Yes Inactive Problems Resolved Problems Electronic Signature(s) Signed: 11/02/2019 2:17:36 PM By: Worthy Keeler PA-C Entered By: Worthy Keeler on 11/02/2019 14:17:35 -------------------------------------------------------------------------------- Progress Note Details Patient Name: Date of Service: EDWEN, MCLESTER 11/02/2019 1:45 PM Medical Record [id number] Patient Account Number: [id number] Date of Birth/Sex: Treating RN: 08/29/1945 (74 y.o. [name] Primary Care Provider: Cathlean Cower Other Clinician: Referring Provider: Treating Provider/Extender:Stone III, Basilia Jumbo, Casper Harrison in Treatment: 2 Subjective Chief Complaint Information obtained from Patient Right LE Ulcers History of Present Illness (HPI) 10/19/2019 upon evaluation today patient appears to be doing somewhat poorly upon initial evaluation here in our clinic. He tells me that he has really an  undisclosed amount of time that he has been dealing with issues with his legs bilaterally. The left leg swells minimally the right leg has wounds and unfortunately does seem to swell much more significantly. Fortunately there is no evidence of active infection at this time. No fevers, chills, nausea, vomiting, or diarrhea. With that being said the patient tells me that he does have a history of diabetes with his most recent hemoglobin A1c being 7.1 that was on 10/06/2019. He has been using Neosporin over the area. He also has what appears to be lymphedema stage III, chronic venous insufficiency, diabetes mellitus type 2, hypertension, COPD, paroxysmal atrial fibrillation, and long-term use of anticoagulant therapy. The patient really does not know how long the wounds currently have been open  although he tells me "months". 10/26/2019 on evaluation today patient appears to be doing excellent in regard to his right lower extremity. He just has a couple small areas remaining open at this point the majority of the wounds have all healed. Fortunately there is no signs of active infection. He does have a compression sock on the left lower extremity however the sock does have holes in it I think it is time for some new ones I think he would do well to get some from elastic therapy in [facility] as they will be much more inexpensive but at the same time excellent quality 11/02/2019 upon evaluation today patient appears to be doing very well with regard to his right lower extremity. Fortunately there is no evidence of active infection which is good news. Overall I think that he has done extremely well at this point. Objective Constitutional Well-nourished and well-hydrated in no acute distress. Vitals Time Taken: 2:00 PM, Height: 73 in, Weight: 275 lbs, BMI: 36.3, Temperature: 97.9 F, Pulse: 49 bpm, Respiratory Rate: 18 breaths/min, Blood Pressure: 114/52 mmHg. Respiratory normal breathing  without difficulty. Psychiatric this patient is able to make decisions and demonstrates good insight into disease process. Alert and Oriented x 3. pleasant and cooperative. General Notes: Patient's wound bed currently showed signs of complete epithelization there is no sign of open wounds at this time which is good news and overall I am extremely happy with the fact that he healed so quickly this is great. Integumentary (Hair, Skin) Wound #1 status is Open. Original cause of wound was Blister. The wound is located on the Right,Medial Lower Leg. The wound measures 0cm length x 0cm width x 0cm depth; 0cm^2 area and 0cm^3 volume. Assessment Active Problems ICD-10 Lymphedema, not elsewhere classified Venous insufficiency (chronic) (peripheral) Non-pressure chronic ulcer of other part of right lower leg with fat layer exposed Type 2 diabetes mellitus with other skin ulcer Essential (primary) hypertension Chronic obstructive pulmonary disease, unspecified Paroxysmal atrial fibrillation Long term (current) use of anticoagulants Plan Discharge From [facility] Services: Discharge from [place] Skin Barriers/Peri-Wound Care: Moisturizing lotion - to right leg Edema Control: Elevate legs to the level of the heart or above for 30 minutes daily and/or when sitting, a frequency of: Exercise regularly Support Garment 20-30 mm/Hg pressure to: - both legs daily 1. I would recommend currently that we go ahead and continue to have the patient wear compression stockings. We have previously last week given him the measurements for getting compression socks. He did not call [facility] but again he can definitely get them somewhere closer around here. For that reason we will give him the measurements again and they can see about getting something from possibly advanced home care. I am definitely okay with that. 2. He needs to continue to elevate his legs is much as possible. We will see the patient back  for follow-up visit as needed. Electronic Signature(s) Signed: 11/02/2019 2:41:07 PM By: Worthy Keeler PA-C Entered By: Worthy Keeler on 11/02/2019 14:41:07 -------------------------------------------------------------------------------- SuperBill Details Patient Name: Date of Service: MOHSIN, CRUM 11/02/2019 Medical Record [id number] Patient Account Number: [id number] Date of Birth/Sex: Treating RN: [birth year] (74 y.o. 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 E11.622 Type  2 diabetes mellitus with other skin ulcer I10 Essential (primary) hypertension J44.9 Chronic obstructive pulmonary disease, unspecified I48.0 Paroxysmal atrial fibrillation Z79.01 Long term (current) use of anticoagulants Facility Procedures CPT4 Code: [id number] Description: 99213 - WOUND CARE VISIT-LEV 3 EST PT Modifier: Quantity: 1 Physician Procedures CPT4 Code Description: [id number] - WC PHYS LEVEL 3 - EST PT ICD-10 Diagnosis Description I89.0 Lymphedema, not elsewhere classified I87.2 Venous insufficiency (chronic) (peripheral) L97.812 Non-pressure chronic ulcer of other part of right lower  E11.622 Type 2 diabetes mellitus with other skin ulcer Modifier: leg with fat la Quantity: 1 yer exposed Electronic Signature(s) Signed: 11/02/2019 4:44:47 PM By: Worthy Keeler PA-C Signed: 11/02/2019 5:43:24 PM By: Baruch Gouty RN, BSN Previous Signature: 11/02/2019 2:41:28 PM Version By: Worthy Keeler PA-C Entered By: Baruch Gouty on 11/02/2019 14:41:46

## 2019-11-02 NOTE — Progress Notes (Signed)
RAYNALD, ROUILLARD ([id number]) Visit Report for 11/02/2019 Arrival Information Details Patient Name: Date of Service: [name], [name] 11/02/2019 1:45 PM Medical Record [id number] Patient Account Number: [id number] Date of Birth/Sex: Treating RN: [birth year] (74 y.o. [name] Primary Care Kiante Petrovich: Cathlean Cower Other Clinician: Referring Darik Massing: Treating Evola Hollis/Extender:Stone III, Basilia Jumbo, Casper Harrison in Treatment: 2 Visit Information History Since Last Visit Added or deleted any medications: No Patient Arrived: Wheel Chair Any new allergies or adverse reactions: No Arrival Time: 14:00 Had a fall or experienced change in No Accompanied By: daughter activities of daily living that may affect Transfer Assistance: None risk of falls: Patient Identification Verified: Yes Signs or symptoms of abuse/neglect since last No Secondary Verification Process Yes visito Completed: Hospitalized since last visit: No Patient Requires Transmission- No Implantable device outside of the clinic excluding No Based Precautions: cellular tissue based products placed in the center Patient Has Alerts: Yes since last visit: Patient Alerts: Patient on Blood Has Dressing in Place as Prescribed: Yes Thinner Pain Present Now: No Electronic Signature(s) Signed: 11/02/2019 2:20:25 PM By: Sandre Kitty Entered By: Sandre Kitty on 11/02/2019 14:00:36 -------------------------------------------------------------------------------- Clinic Level of Care Assessment Details Patient Name: Date of Service: [name], [name] 11/02/2019 1:45 PM Medical Record [id number] Patient Account Number: [id number] Date of Birth/Sex: Treating RN: June 25, 1946 (74 y.o. [name] Primary Care Catrina Fellenz: Cathlean Cower Other Clinician: Referring [name] [name]: Treating Edwina Grossberg/Extender:Stone III, Basilia Jumbo, Casper Harrison in Treatment: 2 Clinic Level of Care Assessment Items TOOL 4  Quantity Score []  - Use when only an EandM is performed on FOLLOW-UP visit 0 ASSESSMENTS - Nursing Assessment / Reassessment X - Reassessment of Co-morbidities (includes updates in patient status) 1 10 X - Reassessment of Adherence to Treatment Plan 1 5 ASSESSMENTS - Wound and Skin Assessment / Reassessment X - Simple Wound Assessment / Reassessment - one wound 1 5 []  - Complex Wound Assessment / Reassessment - multiple wounds 0 []  - Dermatologic / Skin Assessment (not related to wound area) 0 ASSESSMENTS - Focused Assessment X - Circumferential Edema Measurements - multi extremities 1 5 []  - Nutritional Assessment / Counseling / Intervention 0 X - Lower Extremity Assessment (monofilament, tuning fork, pulses) 1 5 []  - Peripheral Arterial Disease Assessment (using hand held doppler) 0 ASSESSMENTS - Ostomy and/or Continence Assessment and Care []  - Incontinence Assessment and Management 0 []  - Ostomy Care Assessment and Management (repouching, etc.) 0 PROCESS - Coordination of Care X - Simple Patient / Family Education for ongoing care 1 15 []  - Complex (extensive) Patient / Family Education for ongoing care 0 X - Staff obtains Programmer, systems, Records, Test Results / Process Orders 1 10 []  - Staff telephones HHA, Nursing Homes / Clarify orders / etc 0 []  - Routine Transfer to another Facility (non-emergent condition) 0 []  - Routine Hospital Admission (non-emergent condition) 0 []  - New Admissions / Biomedical engineer / Ordering NPWT, Apligraf, etc. 0 []  - Emergency Hospital Admission (emergent condition) 0 X - Simple Discharge Coordination 1 10 []  - Complex (extensive) Discharge Coordination 0 PROCESS - Special Needs []  - Pediatric / Minor Patient Management 0 []  - Isolation Patient Management 0 []  - Hearing / Language / Visual special needs 0 []  - Assessment of Community assistance (transportation, D/C planning, etc.) 0 []  - Additional assistance / Altered mentation 0 []  - Support  Surface(s) Assessment (bed, cushion, seat, etc.) 0 INTERVENTIONS - Wound Cleansing / Measurement X - Simple Wound Cleansing - one wound 1 5 []  - Complex Wound Cleansing - multiple  wounds 0 X - Wound Imaging (photographs - any number of wounds) 1 5 []  - Wound Tracing (instead of photographs) 0 []  - Simple Wound Measurement - one wound 0 []  - Complex Wound Measurement - multiple wounds 0 INTERVENTIONS - Wound Dressings []  - Small Wound Dressing one or multiple wounds 0 []  - Medium Wound Dressing one or multiple wounds 0 []  - Large Wound Dressing one or multiple wounds 0 []  - Application of Medications - topical 0 []  - Application of Medications - injection 0 INTERVENTIONS - Miscellaneous []  - External ear exam 0 []  - Specimen Collection (cultures, biopsies, blood, body fluids, etc.) 0 []  - Specimen(s) / Culture(s) sent or taken to Lab for analysis 0 []  - Patient Transfer (multiple staff / Civil Service fast streamer / Similar devices) 0 []  - Simple Staple / Suture removal (25 or less) 0 []  - Complex Staple / Suture removal (26 or more) 0 []  - Hypo / Hyperglycemic Management (close monitor of Blood Glucose) 0 []  - Ankle / Brachial Index (ABI) - do not check if billed separately 0 X - Vital Signs 1 5 Has the patient been seen at the hospital within the last three years: Yes Total Score: 80 Level Of Care: New/Established - Level 3 Electronic Signature(s) Signed: 11/02/2019 5:43:24 PM By: Baruch Gouty RN, BSN Entered By: Baruch Gouty on 11/02/2019 14:37:51 -------------------------------------------------------------------------------- Encounter Discharge Information Details Patient Name: Date of Service: [name], [name] 11/02/2019 1:45 PM Medical Record [id number] Patient Account Number: [id number] Date of Birth/Sex: Treating RN: March 22, 1946 (74 y.o. [name] Primary Care Cheyenne Schumm: Cathlean Cower Other Clinician: Referring Kaled Allende: Treating Joran Kallal/Extender:Stone III,  Basilia Jumbo, Casper Harrison in Treatment: 2 Encounter Discharge Information Items Discharge Condition: Stable Ambulatory Status: Wheelchair Discharge Destination: Home Transportation: Private Auto Accompanied By: Charolett Bumpers Schedule Follow-up Appointment: Yes Clinical Summary of Care: Patient Declined Electronic Signature(s) Signed: 11/02/2019 5:43:24 PM By: Baruch Gouty RN, BSN Entered By: Baruch Gouty on 11/02/2019 15:03:51 -------------------------------------------------------------------------------- Multi-Disciplinary Care Plan Details Patient Name: Date of Service: [name], [name] 11/02/2019 1:45 PM Medical Record [id number] Patient Account Number: [id number] Date of Birth/Sex: Treating RN: Dec 26, 1945 (73 y.o. [name] Primary Care Sharia Averitt: Cathlean Cower Other Clinician: Referring Kennth Vanbenschoten: Treating Jaquila Santelli/Extender:Stone III, Basilia Jumbo, Casper Harrison in Treatment: 2 Active Inactive Electronic Signature(s) Signed: 11/02/2019 5:43:24 PM By: Baruch Gouty RN, BSN Entered By: Baruch Gouty on 11/02/2019 14:35:22 -------------------------------------------------------------------------------- Pain Assessment Details Patient Name: Date of Service: [name], [name] 11/02/2019 1:45 PM Medical Record [id number] Patient Account Number: [id number] Date of Birth/Sex: Treating RN: 16-Jan-1946 (74 y.o. [name] Primary Care Mateya Torti: Cathlean Cower Other Clinician: Referring Theta Leaf: Treating Jameela Michna/Extender:Stone III, Basilia Jumbo, Casper Harrison in Treatment: 2 Active Problems Location of Pain Severity and Description of Pain Patient Has Paino No Site Locations Pain Management and Medication Current Pain Management: Electronic Signature(s) Signed: 11/02/2019 2:20:25 PM By: Sandre Kitty Signed: 11/02/2019 5:43:24 PM By: Baruch Gouty RN, BSN Entered By: Sandre Kitty on 11/02/2019  14:01:08 -------------------------------------------------------------------------------- Patient/Caregiver Education Details [name], 4/14/2021andnbsp1:45 Patient Name: Date of Service: Why PM Medical Record Patient Account Number: [id number] Number: Treating RN: Baruch Gouty Date of Birth/Gender: 11/07/1945 (73 y.o. M) Other Clinician: Primary Care Physician:John, Geanie Kenning Worthy Keeler Referring Physician: Physician/Extender: Rogene Houston in Treatment: 2 Education Assessment Education Provided To: Patient Education Topics Provided Venous: Methods: Explain/Verbal Responses: Reinforcements needed, State content correctly Wound/Skin Impairment: Methods: Explain/Verbal Responses: Reinforcements needed, State content correctly Electronic Signature(s) Signed: 11/02/2019 5:43:24 PM By: Baruch Gouty RN, BSN Entered By: Baruch Gouty on  11/02/2019 14:35:52 -------------------------------------------------------------------------------- Wound Assessment Details Patient Name: Date of Service: [name], [name] 11/02/2019 1:45 PM Medical Record [id number] Patient Account Number: [id number] Date of Birth/Sex: Treating RN: 1946/01/01 (74 y.o. [name] Primary Care Ashtyn Freilich: Cathlean Cower Other Clinician: Referring Sunshine Mackowski: Treating Obie Silos/Extender:Stone III, Basilia Jumbo, Casper Harrison in Treatment: 2 Wound Status Wound Number: 1 Primary Etiology: Lymphedema Wound Location: Right, Medial Lower Leg Wound Status: Open Wounding Event: Blister Date Acquired: 07/22/2019 Weeks Of Treatment: 2 Clustered Wound: No Wound Measurements Length: (cm) 0 % Reduct Width: (cm) 0 % Reduct Depth: (cm) 0 Area: (cm) 0 Volume: (cm) 0 Wound Description Classification: Full Thickness Without Exposed Support Structures ion in Area: 100% ion in Volume: 100% Electronic Signature(s) Signed: 11/02/2019 2:20:25 PM By: Sandre Kitty Signed:  11/02/2019 5:43:24 PM By: Baruch Gouty RN, BSN Entered By: Sandre Kitty on 11/02/2019 14:01:15 -------------------------------------------------------------------------------- Vitals Details Patient Name: Date of Service: [name], [name] 11/02/2019 1:45 PM Medical Record [id number] Patient Account Number: [id number] Date of Birth/Sex: Treating RN: November 13, 1945 (74 y.o. [name] Primary Care Lorren Rossetti: Cathlean Cower Other Clinician: Referring Nikea Settle: Treating Arda Keadle/Extender:Stone III, Basilia Jumbo, Casper Harrison in Treatment: 2 Vital Signs Time Taken: 14:00 Temperature (F): 97.9 Height (in): 73 Pulse (bpm): 49 Weight (lbs): 275 Respiratory Rate (breaths/min): 18 Body Mass Index (BMI): 36.3 Blood Pressure (mmHg): 114/52 Reference Range: 80 - 120 mg / dl Electronic Signature(s) Signed: 11/02/2019 2:20:25 PM By: Sandre Kitty Entered By: Sandre Kitty on 11/02/2019 14:01:00

## 2019-11-02 NOTE — Progress Notes (Addendum)
FIRMIN, BELISLE ([id number]) Visit Report for 10/19/2019 Chief Complaint Document Details Patient Name: Date of Service: [name], [name] 10/19/2019 10:30 AM Medical Record [id number] Patient Account Number: [id number] Date of Birth/Sex: Treating RN: 15-Sep-1945 (74 y.o. [name] Primary Care Provider: Cathlean Cower Other Clinician: Referring Provider: Treating Provider/Extender:Stone III, Basilia Jumbo, Casper Harrison in Treatment: 0 Information Obtained from: Patient Chief Complaint Right LE Ulcers Electronic Signature(s) Signed: 10/19/2019 12:11:48 PM By: Worthy Keeler PA-C Entered By: Worthy Keeler on 10/19/2019 12:11:47 -------------------------------------------------------------------------------- HPI Details Patient Name: Date of Service: [name], [name] 10/19/2019 10:30 AM Medical Record [id number] Patient Account Number: [id number] Date of Birth/Sex: Treating RN: 1946/06/10 (74 y.o. [name] Primary Care Provider: Cathlean Cower Other Clinician: Referring Provider: Treating Provider/Extender:Stone III, Basilia Jumbo, Casper Harrison in Treatment: 0 History of Present Illness HPI Description: 10/19/2019 upon evaluation today patient appears to be doing somewhat poorly upon initial evaluation here in our clinic. He tells me that he has really an undisclosed amount of time that he has been dealing with issues with his legs bilaterally. The left leg swells minimally the right leg has wounds and unfortunately does seem to swell much more significantly. Fortunately there is no evidence of active infection at this time. No fevers, chills, nausea, vomiting, or diarrhea. With that being said the patient tells me that he does have a history of diabetes with his most recent hemoglobin A1c being 7.1 that was on 10/06/2019. He has been using Neosporin over the area. He also has what appears to be lymphedema stage III, chronic venous insufficiency, diabetes  mellitus type 2, hypertension, COPD, paroxysmal atrial fibrillation, and long-term use of anticoagulant therapy. The patient really does not know how long the wounds currently have been open although he tells me "months". Electronic Signature(s) Signed: 10/19/2019 1:13:25 PM By: Worthy Keeler PA-C Entered By: Worthy Keeler on 10/19/2019 13:13:24 -------------------------------------------------------------------------------- Physical Exam Details Patient Name: Date of Service: [name], [name] 10/19/2019 10:30 AM Medical Record [id number] Patient Account Number: [id number] Date of Birth/Sex: Treating RN: 1945/09/07 (74 y.o. [name] Primary Care Provider: Cathlean Cower Other Clinician: Referring Provider: Treating Provider/Extender:Stone III, Basilia Jumbo, Casper Harrison in Treatment: 0 Constitutional sitting or standing blood pressure is within target range for patient.. pulse regular and within target range for patient.Marland Kitchen respirations regular, non-labored and within target range for patient.Marland Kitchen temperature within target range for patient.. Well-nourished and well-hydrated in no acute distress. Eyes conjunctiva clear no eyelid edema noted. pupils equal round and reactive to light and accommodation. Ears, Nose, Mouth, and Throat no gross abnormality of ear auricles or external auditory canals. normal hearing noted during conversation. mucus membranes moist. Respiratory normal breathing without difficulty. Cardiovascular 1+ dorsalis pedis/posterior tibialis pulses. Patient has stage III lymphedema of the right lower extremity and stage II lymphedema of the left lower extremity.. Musculoskeletal normal gait and posture. no significant deformity or arthritic changes, no loss or range of motion, no clubbing. Psychiatric this patient is able to make decisions and demonstrates good insight into disease process. Alert and Oriented x 3. pleasant and cooperative. Notes Upon  inspection patient's wound bed actually showed signs of good granulation in some areas he did have mainly just skin breakdown and others and overall other than the significant lymphedema feel like he is doing fairly well with regard to the overall appearance of the wound openings I think he will do well with compression at this point as far as getting things healed up. I explained to the patient that  he unfortunately is getting need to have ongoing compression therapy in order to prevent more significant ulcerations and issues with his legs. Electronic Signature(s) Signed: 10/19/2019 1:14:21 PM By: Worthy Keeler PA-C Entered By: Worthy Keeler on 10/19/2019 13:14:20 -------------------------------------------------------------------------------- Physician Orders Details Patient Name: Date of Service: [name], [name] 10/19/2019 10:30 AM Medical Record [id number] Patient Account Number: [id number] Date of Birth/Sex: Treating RN: 02-Mar-1946 (74 y.o. [name] Primary Care Provider: Cathlean Cower Other Clinician: Referring Provider: Treating Provider/Extender:Stone III, Basilia Jumbo, Casper Harrison in Treatment: 0 Verbal / Phone Orders: No Diagnosis Coding ICD-10 Coding Code Description I89.0 Lymphedema, not elsewhere classified I87.2 Venous insufficiency (chronic) (peripheral) L97.812 Non-pressure chronic ulcer of other part of right lower leg with fat layer exposed E11.622 Type 2 diabetes mellitus with other skin ulcer I10 Essential (primary) hypertension J44.9 Chronic obstructive pulmonary disease, unspecified I48.0 Paroxysmal atrial fibrillation Z79.01 Long term (current) use of anticoagulants Follow-up Appointments Return Appointment in 1 week. Dressing Change Frequency Do not change entire dressing for one week. Skin Barriers/Peri-Wound Care Moisturizing lotion - to right leg Wound Cleansing May shower with protection. Primary Wound Dressing Wound #1  Right,Medial Lower Leg Calcium Alginate with Silver Wound #2 Right,Lateral Lower Leg Calcium Alginate with Silver Wound #3 Right,Lateral,Anterior Lower Leg Calcium Alginate with Silver Wound #4 Right,Anterior Lower Leg Calcium Alginate with Silver Secondary Dressing Wound #1 Right,Medial Lower Leg Dry Gauze Wound #2 Right,Lateral Lower Leg Dry Gauze Wound #3 Right,Lateral,Anterior Lower Leg Dry Gauze Wound #4 Right,Anterior Lower Leg Dry Gauze Edema Control 4 layer compression - Right Lower Extremity Avoid standing for long periods of time Elevate legs to the level of the heart or above for 30 minutes daily and/or when sitting, a frequency of: - throughout the day Exercise regularly Consults Podiatry - **ASAP** for diabetic toe nail care Electronic Signature(s) Signed: 10/19/2019 6:50:57 PM By: Baruch Gouty RN, BSN Signed: 10/19/2019 10:08:08 PM By: Worthy Keeler PA-C Entered By: Baruch Gouty on 10/19/2019 12:27:39 -------------------------------------------------------------------------------- Prescription 10/19/2019 Patient Name: Durward Parcel Provider: Worthy Keeler PA Date of Birth: 12-19-1945 NPI#: [id number] Sex: M DEA#: [id number] Phone #: [phone] License #: Patient Address: [address], [place] [address] [address] [place], [place] [address] [phone] Allergies ciprofloxacin Reaction: generalized weakness Severity: Moderate Provider's Orders Podiatry - **ASAP** for diabetic toe nail care Signature(s): Date(s): Electronic Signature(s) Signed: 10/19/2019 6:50:57 PM By: Baruch Gouty RN, BSN Signed: 10/19/2019 10:08:08 PM By: Worthy Keeler PA-C Entered By: Baruch Gouty on 10/19/2019 12:27:40 --------------------------------------------------------------------------------  Problem List Details Patient Name: Date of Service: [name], [name] 10/19/2019  10:30 AM Medical Record [id number] Patient Account Number: [id number] Date of Birth/Sex: Treating RN: Jun 19, 1946 (74 y.o. [name] Primary Care Provider: Cathlean Cower Other Clinician: Referring Provider: Treating Provider/Extender:Stone III, Basilia Jumbo, Casper Harrison in Treatment: 0 Active Problems ICD-10 Evaluated Encounter Code Description Active Date Today Diagnosis I89.0 Lymphedema, not elsewhere classified 10/19/2019 No Yes I87.2 Venous insufficiency (chronic) (peripheral) 10/19/2019 No Yes L97.812 Non-pressure chronic ulcer of other part of right lower 10/19/2019 No Yes leg with fat layer exposed E11.622 Type 2 diabetes mellitus with other skin ulcer 10/19/2019 No Yes I10 Essential (primary) hypertension 10/19/2019 No Yes J44.9 Chronic obstructive pulmonary disease, unspecified 10/19/2019 No Yes I48.0 Paroxysmal atrial fibrillation 10/19/2019 No Yes Z79.01 Long term (current) use of anticoagulants 10/19/2019 No Yes Inactive Problems Resolved Problems Electronic Signature(s) Signed: 10/19/2019 12:11:28 PM By: Worthy Keeler PA-C Entered By: Joaquim Lai  IIIMargarita Grizzle on 10/19/2019 12:11:28 -------------------------------------------------------------------------------- Progress Note Details Patient Name: Date of Service: [name], [name] 10/19/2019 10:30 AM Medical Record [id number] Patient Account Number: [id number] Date of Birth/Sex: Treating RN: 11-10-1945 (74 y.o. [name] Primary Care Provider: Cathlean Cower Other Clinician: Referring Provider: Treating Provider/Extender:Stone III, Basilia Jumbo, Casper Harrison in Treatment: 0 Subjective Chief Complaint Information obtained from Patient Right LE Ulcers History of Present Illness (HPI) 10/19/2019 upon evaluation today patient appears to be doing somewhat poorly upon initial evaluation here in our clinic. He tells me that he has really an undisclosed amount of time that he has been dealing with issues with  his legs bilaterally. The left leg swells minimally the right leg has wounds and unfortunately does seem to swell much more significantly. Fortunately there is no evidence of active infection at this time. No fevers, chills, nausea, vomiting, or diarrhea. With that being said the patient tells me that he does have a history of diabetes with his most recent hemoglobin A1c being 7.1 that was on 10/06/2019. He has been using Neosporin over the area. He also has what appears to be lymphedema stage III, chronic venous insufficiency, diabetes mellitus type 2, hypertension, COPD, paroxysmal atrial fibrillation, and long-term use of anticoagulant therapy. The patient really does not know how long the wounds currently have been open although he tells me "months". Patient History Information obtained from Patient. Allergies ciprofloxacin (Severity: Moderate, Reaction: generalized weakness) Social History Former smoker - quit 40 years ago, Marital Status - Divorced, Alcohol Use - Never, Drug Use - No History, Caffeine Use - Moderate - Coffee. Medical History Respiratory Patient has history of Chronic Obstructive Pulmonary Disease (COPD) Cardiovascular Patient has history of Arrhythmia - A-Fib, Hypertension Endocrine Patient has history of Type II Diabetes Musculoskeletal Patient has history of Osteoarthritis Neurologic Patient has history of Neuropathy Patient is treated with Oral Agents. Blood sugar is tested. Medical And Surgical History Notes Cardiovascular Hyperlipidemia Gastrointestinal GERD Genitourinary BPH, Renal Cyst Review of Systems (ROS) Constitutional Symptoms (General Health) Denies complaints or symptoms of Fatigue, Fever, Chills, Marked Weight Change. Eyes Complains or has symptoms of Glasses / Contacts - glasses. Ear/Nose/Mouth/Throat Denies complaints or symptoms of Chronic sinus problems or rhinitis. Integumentary (Skin) Complains or has symptoms of Wounds - wounds on  right lower leg. Psychiatric Denies complaints or symptoms of Claustrophobia, Suicidal. Objective Constitutional sitting or standing blood pressure is within target range for patient.. pulse regular and within target range for patient.Marland Kitchen respirations regular, non-labored and within target range for patient.Marland Kitchen temperature within target range for patient.. Well-nourished and well-hydrated in no acute distress. Vitals Time Taken: 11:16 AM, Height: 73 in, Source: Stated, Weight: 275 lbs, Source: Stated, BMI: 36.3, Temperature: 98.2 F, Pulse: 50 bpm, Respiratory Rate: 18 breaths/min, Blood Pressure: 122/55 mmHg, Capillary Blood Glucose: 118 mg/dl. General Notes: glucose per pt report Eyes conjunctiva clear no eyelid edema noted. pupils equal round and reactive to light and accommodation. Ears, Nose, Mouth, and Throat no gross abnormality of ear auricles or external auditory canals. normal hearing noted during conversation. mucus membranes moist. Respiratory normal breathing without difficulty. Cardiovascular 1+ dorsalis pedis/posterior tibialis pulses. Patient has stage III lymphedema of the right lower extremity and stage II lymphedema of the left lower extremity.. Musculoskeletal normal gait and posture. no significant deformity or arthritic changes, no loss or range of motion, no clubbing. Psychiatric this patient is able to make decisions and demonstrates good insight into disease process. Alert and Oriented x 3. pleasant and cooperative. General Notes:  Upon inspection patient's wound bed actually showed signs of good granulation in some areas he did have mainly just skin breakdown and others and overall other than the significant lymphedema feel like he is doing fairly well with regard to the overall appearance of the wound openings I think he will do well with compression at this point as far as getting things healed up. I explained to the patient that he unfortunately is getting need  to have ongoing compression therapy in order to prevent more significant ulcerations and issues with his legs. Integumentary (Hair, Skin) Wound #1 status is Open. Original cause of wound was Blister. The wound is located on the Right,Medial Lower Leg. The wound measures 4cm length x 5cm width x 0.1cm depth; 15.708cm^2 area and 1.571cm^3 volume. There is Fat Layer (Subcutaneous Tissue) Exposed exposed. There is no tunneling or undermining noted. There is a medium amount of serosanguineous drainage noted. The wound margin is fibrotic, thickened scar. There is large (67-100%) pink granulation within the wound bed. There is no necrotic tissue within the wound bed. Wound #2 status is Open. Original cause of wound was Blister. The wound is located on the Right,Lateral Lower Leg. The wound measures 0.9cm length x 0.4cm width x 0.1cm depth; 0.283cm^2 area and 0.028cm^3 volume. There is Fat Layer (Subcutaneous Tissue) Exposed exposed. There is no tunneling or undermining noted. There is a medium amount of serosanguineous drainage noted. The wound margin is flat and intact. There is medium (34-66%) pink granulation within the wound bed. There is a medium (34-66%) amount of necrotic tissue within the wound bed including Adherent Slough. Wound #3 status is Open. Original cause of wound was Blister. The wound is located on the Right,Lateral,Anterior Lower Leg. The wound measures 1.5cm length x 1.5cm width x 0.1cm depth; 1.767cm^2 area and 0.177cm^3 volume. There is Fat Layer (Subcutaneous Tissue) Exposed exposed. There is no tunneling or undermining noted. There is a medium amount of serosanguineous drainage noted. The wound margin is flat and intact. There is medium (34-66%) pink granulation within the wound bed. There is a medium (34-66%) amount of necrotic tissue within the wound bed including Adherent Slough. Wound #4 status is Open. Original cause of wound was Blister. The wound is located on the  Right,Anterior Lower Leg. The wound measures 1cm length x 1cm width x 0.1cm depth; 0.785cm^2 area and 0.079cm^3 volume. There is Fat Layer (Subcutaneous Tissue) Exposed exposed. There is no tunneling or undermining noted. There is a small amount of serosanguineous drainage noted. The wound margin is flat and intact. There is large (67-100%) red granulation within the wound bed. There is no necrotic tissue within the wound bed. Assessment Active Problems ICD-10 Lymphedema, not elsewhere classified Venous insufficiency (chronic) (peripheral) Non-pressure chronic ulcer of other part of right lower leg with fat layer exposed Type 2 diabetes mellitus with other skin ulcer Essential (primary) hypertension Chronic obstructive pulmonary disease, unspecified Paroxysmal atrial fibrillation Long term (current) use of anticoagulants Procedures Wound #1 Pre-procedure diagnosis of Wound #1 is a Lymphedema located on the Right,Medial Lower Leg . There was a Four Layer Compression Therapy Procedure by Carlene Coria, RN. Post procedure Diagnosis Wound #1: Same as Pre-Procedure Plan Follow-up Appointments: Return Appointment in 1 week. Dressing Change Frequency: Do not change entire dressing for one week. Skin Barriers/Peri-Wound Care: Moisturizing lotion - to right leg Wound Cleansing: May shower with protection. Primary Wound Dressing: Wound #1 Right,Medial Lower Leg: Calcium Alginate with Silver Wound #2 Right,Lateral Lower Leg: Calcium Alginate with Silver  Wound #3 Right,Lateral,Anterior Lower Leg: Calcium Alginate with Silver Wound #4 Right,Anterior Lower Leg: Calcium Alginate with Silver Secondary Dressing: Wound #1 Right,Medial Lower Leg: Dry Gauze Wound #2 Right,Lateral Lower Leg: Dry Gauze Wound #3 Right,Lateral,Anterior Lower Leg: Dry Gauze Wound #4 Right,Anterior Lower Leg: Dry Gauze Edema Control: 4 layer compression - Right Lower Extremity Avoid standing for long periods  of time Elevate legs to the level of the heart or above for 30 minutes daily and/or when sitting, a frequency of: - throughout the day Exercise regularly Consults ordered were: Podiatry - **ASAP** for diabetic toe nail care 1. My suggestion at this time is good to be that we go ahead and initiate treatment with compression wraps at this point for the right lower extremity. I would recommend a 4-layer compression wrap. Also recommend silver alginate as the dressing of choice. 2. I am also can recommend at this time that we go ahead and have the patient continue to elevate his leg we discussed more detail as far as how he sleeps and sits and not putting his feet on the floor. Also did recommend that he needs to have ongoing compression therapy with compression socks which again we can order for him once we get a good measurement next week. 3. The patient does need a referral as it is possible to podiatry for nail care his toenails are actually starting to grow back around and into his toe. Fortunately does not appear to broken down the skin yet but I am concerned it may if he does not get them trend shortly. 4. I do believe the patient is going to likely be taking this more seriously going forward based on what I explained to him today. Absent all think he is at any risk of losing his leg but nonetheless if he does not take care of himself I think that is a distinct possibility in the future I have seen it with other patients and I do not want him to get to that point. He voiced understanding. We will see patient back for reevaluation in 1 week here in the clinic. If anything worsens or changes patient will contact our office for additional recommendations. Electronic Signature(s) Signed: 10/19/2019 1:16:02 PM By: Worthy Keeler PA-C Entered By: Worthy Keeler on 10/19/2019 13:16:02 -------------------------------------------------------------------------------- HxROS Details Patient  Name: Date of Service: [name], [name] 10/19/2019 10:30 AM Medical Record [id number] Patient Account Number: [id number] Date of Birth/Sex: Treating RN: Jun 06, 1946 (74 y.o. [name] Primary Care Provider: Cathlean Cower Other Clinician: Referring Provider: Treating Provider/Extender:Stone III, Basilia Jumbo, Casper Harrison in Treatment: 0 Information Obtained From Patient Constitutional Symptoms (General Health) Complaints and Symptoms: Negative for: Fatigue; Fever; Chills; Marked Weight Change Eyes Complaints and Symptoms: Positive for: Glasses / Contacts - glasses Ear/Nose/Mouth/Throat Complaints and Symptoms: Negative for: Chronic sinus problems or rhinitis Integumentary (Skin) Complaints and Symptoms: Positive for: Wounds - wounds on right lower leg Psychiatric Complaints and Symptoms: Negative for: Claustrophobia; Suicidal Hematologic/Lymphatic Respiratory Medical History: Positive for: Chronic Obstructive Pulmonary Disease (COPD) Cardiovascular Medical History: Positive for: Arrhythmia - A-Fib; Hypertension Past Medical History Notes: Hyperlipidemia Gastrointestinal Medical History: Past Medical History Notes: GERD Endocrine Medical History: Positive for: Type II Diabetes Treated with: Oral agents Blood sugar tested every day: Yes Tested : once a day Genitourinary Medical History: Past Medical History Notes: BPH, Renal Cyst Immunological Musculoskeletal Medical History: Positive for: Osteoarthritis Neurologic Medical History: Positive for: Neuropathy Oncologic Immunizations Pneumococcal Vaccine: Received Pneumococcal Vaccination: Yes Implantable Devices None Family and  Social History Former smoker - quit 40 years ago; Marital Status - Divorced; Alcohol Use: Never; Drug Use: No History; Caffeine Use: Moderate - Coffee; Financial Concerns: No; Food, Clothing or Shelter Needs: No; Support System Lacking: No; Transportation Concerns:  No Electronic Signature(s) Signed: 10/19/2019 6:50:57 PM By: Baruch Gouty RN, BSN Signed: 10/19/2019 10:08:08 PM By: Worthy Keeler PA-C Signed: 11/02/2019 9:21:08 AM By: Sandre Kitty Entered By: Sandre Kitty on 10/19/2019 11:33:19 -------------------------------------------------------------------------------- SuperBill Details Patient Name: Date of Service: [name], [name] 10/19/2019 Medical Record [id number] Patient Account Number: [id number] Date of Birth/Sex: Treating RN: 02-27-1946 (74 y.o. [name] Primary Care Provider: Cathlean Cower Other Clinician: Referring Provider: Treating Provider/Extender:Stone III, Basilia Jumbo, Casper Harrison in Treatment: 0 Diagnosis Coding ICD-10 Codes Code Description I89.0 Lymphedema, not elsewhere classified I87.2 Venous insufficiency (chronic) (peripheral) L97.812 Non-pressure chronic ulcer of other part of right lower leg with fat layer exposed E11.622 Type 2 diabetes mellitus with other skin ulcer I10 Essential (primary) hypertension J44.9 Chronic obstructive pulmonary disease, unspecified I48.0 Paroxysmal atrial fibrillation Z79.01 Long term (current) use of anticoagulants Facility Procedures CPT4 Code Description: [id number] - WOUND CARE VISIT-LEV 3 EST PT Modifier: 25 Quantity: 1 CPT4 Code Description: [id number] (Facility Use Only) [id number] - Mayer LWR RT LEG Modifier: Quantity: 1 Physician Procedures CPT4 Code Description: [id number] WC PHYS LEVEL 3 NEW PT ICD-10 Diagnosis Description I89.0 Lymphedema, not elsewhere classified I87.2 Venous insufficiency (chronic) (peripheral) L97.812 Non-pressure chronic ulcer of other part of right lowe E11.622 Type 2  diabetes mellitus with other skin ulcer Modifier: r leg with fat la Quantity: 1 yer exposed Electronic Signature(s) Signed: 10/19/2019 1:17:37 PM By: Worthy Keeler PA-C Entered By: Worthy Keeler on 10/19/2019 13:17:06

## 2019-11-02 NOTE — Progress Notes (Signed)
[name], [name] ([id number]) Visit Report for 10/19/2019 Abuse/Suicide Risk Screen Details Patient Name: Date of Service: [name], [name] 10/19/2019 10:30 AM Medical Record [id number] Patient Account Number: [id number] Date of Birth/Sex: Treating RN: 08-Sep-1945 (74 y.o. [name] Primary Care Provider: Cathlean Cower Other Clinician: Referring Provider: Treating Provider/Extender:Stone III, Basilia Jumbo, Casper Harrison in Treatment: 0 Abuse/Suicide Risk Screen Items Answer ABUSE RISK SCREEN: Has anyone close to you tried to hurt or harm you recentlyo No Do you feel uncomfortable with anyone in your familyo No Has anyone forced you do things that you didnt want to doo No Electronic Signature(s) Signed: 10/19/2019 6:50:57 PM By: Baruch Gouty RN, BSN Signed: 11/02/2019 9:21:08 AM By: Sandre Kitty Entered By: Sandre Kitty on 10/19/2019 11:33:25 -------------------------------------------------------------------------------- Activities of Daily Living Details Patient Name: Date of Service: [name] 10/19/2019 10:30 AM Medical Record [id number] Patient Account Number: [id number] Date of Birth/Sex: Treating RN: 05-30-1946 (73 y.o. [name] Primary Care Provider: Cathlean Cower Other Clinician: Referring Provider: Treating Provider/Extender:Stone III, Basilia Jumbo, Casper Harrison in Treatment: 0 Activities of Daily Living Items Answer Activities of Daily Living (Please select one for each item) Drive Automobile Need Assistance Take Medications Completely Able Use Telephone Completely Able Care for Appearance Completely Able Use Toilet Completely Able Bath / Shower Completely Able Dress Self Completely Able Feed Self Completely Able Walk Need Assistance Get In / Out Bed Completely Able Housework Need Assistance Prepare Meals Need Assistance Handle Money Completely Able Shop for Self Need Assistance Electronic Signature(s) Signed:  10/19/2019 6:50:57 PM By: Baruch Gouty RN, BSN Signed: 11/02/2019 9:21:08 AM By: Sandre Kitty Entered By: Sandre Kitty on 10/19/2019 11:33:54 -------------------------------------------------------------------------------- Education Screening Details Patient Name: Date of Service: [name] 10/19/2019 10:30 AM Medical Record [id number] Patient Account Number: [id number] Date of Birth/Sex: Treating RN: [birth year] (73 y.o. [name] Primary Care Provider: Cathlean Cower Other Clinician: Referring Provider: Treating Provider/Extender:Stone III, Basilia Jumbo, Casper Harrison in Treatment: 0 Primary Learner Assessed: Patient Learning Preferences/Education Level/Primary Language Learning Preference: Explanation, Demonstration, Printed Material Highest Education Level: High School Preferred Language: English Cognitive Barrier Language Barrier: No Translator Needed: No Memory Deficit: No Emotional Barrier: No Cultural/Religious Beliefs Affecting Medical Care: No Physical Barrier Impaired Vision: No Impaired Hearing: No Decreased Hand dexterity: No Knowledge/Comprehension Knowledge Level: High Comprehension Level: High Ability to understand written High instructions: Ability to understand verbal High instructions: Motivation Anxiety Level: Calm Cooperation: Cooperative Education Importance: Acknowledges Need Interest in Health Problems: Asks Questions Perception: Coherent Willingness to Engage in Self- High Management Activities: Readiness to Engage in Self- High Management Activities: Electronic Signature(s) Signed: 10/19/2019 6:50:57 PM By: Baruch Gouty RN, BSN Signed: 11/02/2019 9:21:08 AM By: Sandre Kitty Entered By: Sandre Kitty on 10/19/2019 11:34:28 -------------------------------------------------------------------------------- Fall Risk Assessment Details Patient Name: Date of Service: [name] 10/19/2019 10:30  AM Medical Record [id number] Patient Account Number: [id number] Date of Birth/Sex: Treating RN: 1945/08/23 (74 y.o. [name] Primary Care Provider: Cathlean Cower Other Clinician: Referring Provider: Treating Provider/Extender:Stone III, Basilia Jumbo, Casper Harrison in Treatment: 0 Fall Risk Assessment Items Have you had 2 or more falls in the last 12 monthso 0 No Have you had any fall that resulted in injury in the last 12 monthso 0 No FALLS RISK SCREEN History of falling - immediate or within 3 months 0 No Secondary diagnosis (Do you have 2 or more medical diagnoseso) 15 Yes Ambulatory aid None/bed rest/wheelchair/nurse 0 Yes Crutches/cane/walker 0 No Furniture 0 No Intravenous therapy Access/Saline/Heparin Lock 0 No Gait/Transferring Normal/ bed rest/  wheelchair 0 No Impaired (short steps with shuffle, may have difficulty arising from chair, 0 No head down, impaired balance) Mental Status Oriented to own ability 0 Yes Overestimates or forgets limitations 0 No Risk Level: Medium Risk Score: 25 Electronic Signature(s) Signed: 10/19/2019 6:50:57 PM By: Baruch Gouty RN, BSN Signed: 11/02/2019 9:21:08 AM By: Sandre Kitty Entered By: Sandre Kitty on 10/19/2019 11:35:12 -------------------------------------------------------------------------------- Foot Assessment Details Patient Name: Date of Service: [name] 10/19/2019 10:30 AM Medical Record [id number] Patient Account Number: [id number] Date of Birth/Sex: Treating RN: 16-May-1946 (74 y.o. [name] Primary Care Provider: Cathlean Cower Other Clinician: Referring Provider: Treating Provider/Extender:Stone III, Basilia Jumbo, Casper Harrison in Treatment: 0 Foot Assessment Items Site Locations + = Sensation present, - = Sensation absent, C = Callus, U = Ulcer R = Redness, W = Warmth, M = Maceration, PU = Pre-ulcerative lesion F = Fissure, S = Swelling, D = Dryness Assessment Right:  Left: Other Deformity: No No Prior Foot Ulcer: No No Prior Amputation: No No Charcot Joint: No No Ambulatory Status: Ambulatory With Help Assistance Device: Wheelchair Gait: Steady Electronic Signature(s) Signed: 10/19/2019 6:50:57 PM By: Baruch Gouty RN, BSN Signed: 11/02/2019 9:21:08 AM By: Sandre Kitty Entered By: Sandre Kitty on 10/19/2019 11:36:48 -------------------------------------------------------------------------------- Nutrition Risk Screening Details Patient Name: Date of Service: BASIM, BARTNIK 10/19/2019 10:30 AM Medical Record [id number] Patient Account Number: [id number] Date of Birth/Sex: Treating RN: [birth year] (73 y.o. [name] Primary Care Provider: Cathlean Cower Other Clinician: Referring Provider: Treating Provider/Extender:Stone III, Basilia Jumbo, Casper Harrison in Treatment: 0 Height (in): 73 Weight (lbs): 275 Body Mass Index (BMI): 36.3 Nutrition Risk Screening Items Score Screening NUTRITION RISK SCREEN: I have an illness or condition that made me change the kind and/or 2 Yes amount of food I eat I eat fewer than two meals per day 0 No I eat few fruits and vegetables, or milk products 0 No I have three or more drinks of beer, liquor or wine almost every day 0 No I have tooth or mouth problems that make it hard for me to eat 0 No I don't always have enough money to buy the food I need 0 No I eat alone most of the time 0 No I take three or more different prescribed or over-the-counter drugs a day 1 Yes 0 No Without wanting to, I have lost or gained 10 pounds in the last six months I am not always physically able to shop, cook and/or feed myself 2 Yes Nutrition Protocols Good Risk Protocol Provide education on Moderate Risk Protocol 0 nutrition High Risk Proctocol Risk Level: Moderate Risk Score: 5 Electronic Signature(s) Signed: 10/19/2019 6:50:57 PM By: Baruch Gouty RN, BSN Signed: 11/02/2019 9:21:08 AM By: Sandre Kitty Entered By: Sandre Kitty on 10/19/2019 11:35:25

## 2019-11-24 ENCOUNTER — Telehealth: Payer: Self-pay | Admitting: Internal Medicine

## 2019-11-24 DIAGNOSIS — I509 Heart failure, unspecified: Secondary | ICD-10-CM

## 2019-11-24 DIAGNOSIS — I639 Cerebral infarction, unspecified: Secondary | ICD-10-CM

## 2019-11-24 DIAGNOSIS — J449 Chronic obstructive pulmonary disease, unspecified: Secondary | ICD-10-CM

## 2019-11-24 NOTE — Telephone Encounter (Signed)
New message:   Pt is calling and states he would like a prescription for a lift chair. Please advise.

## 2019-11-24 NOTE — Telephone Encounter (Signed)
Done hardcopy to maria 

## 2019-11-30 NOTE — Telephone Encounter (Signed)
   Daughter requesting order be sent to [facility]. Please fax to [phone]  Requesting order Office note  demographic sheet

## 2019-12-05 NOTE — Telephone Encounter (Signed)
Fax was sent with all asking documents.

## 2019-12-05 NOTE — Telephone Encounter (Signed)
Daughter is calling to follow up.  Informed the information has been faxed to [facility].

## 2019-12-21 ENCOUNTER — Ambulatory Visit: Payer: Medicare Other | Admitting: Podiatry

## 2020-01-10 ENCOUNTER — Ambulatory Visit: Payer: Medicare Other | Admitting: Podiatry

## 2020-01-25 ENCOUNTER — Encounter (HOSPITAL_BASED_OUTPATIENT_CLINIC_OR_DEPARTMENT_OTHER): Payer: Medicare Other | Attending: Physician Assistant | Admitting: Physician Assistant

## 2020-01-25 DIAGNOSIS — I110 Hypertensive heart disease with heart failure: Secondary | ICD-10-CM | POA: Diagnosis not present

## 2020-01-25 DIAGNOSIS — Z87891 Personal history of nicotine dependence: Secondary | ICD-10-CM | POA: Insufficient documentation

## 2020-01-25 DIAGNOSIS — E1140 Type 2 diabetes mellitus with diabetic neuropathy, unspecified: Secondary | ICD-10-CM | POA: Diagnosis not present

## 2020-01-25 DIAGNOSIS — M1990 Unspecified osteoarthritis, unspecified site: Secondary | ICD-10-CM | POA: Diagnosis not present

## 2020-01-25 DIAGNOSIS — I480 Paroxysmal atrial fibrillation: Secondary | ICD-10-CM | POA: Diagnosis not present

## 2020-01-25 DIAGNOSIS — I872 Venous insufficiency (chronic) (peripheral): Secondary | ICD-10-CM | POA: Diagnosis not present

## 2020-01-25 DIAGNOSIS — I5042 Chronic combined systolic (congestive) and diastolic (congestive) heart failure: Secondary | ICD-10-CM | POA: Diagnosis not present

## 2020-01-25 DIAGNOSIS — Z881 Allergy status to other antibiotic agents status: Secondary | ICD-10-CM | POA: Insufficient documentation

## 2020-01-25 DIAGNOSIS — Z8249 Family history of ischemic heart disease and other diseases of the circulatory system: Secondary | ICD-10-CM | POA: Insufficient documentation

## 2020-01-25 DIAGNOSIS — L97512 Non-pressure chronic ulcer of other part of right foot with fat layer exposed: Secondary | ICD-10-CM | POA: Diagnosis not present

## 2020-01-25 DIAGNOSIS — E11622 Type 2 diabetes mellitus with other skin ulcer: Secondary | ICD-10-CM | POA: Diagnosis not present

## 2020-01-25 DIAGNOSIS — Z7901 Long term (current) use of anticoagulants: Secondary | ICD-10-CM | POA: Diagnosis not present

## 2020-01-25 DIAGNOSIS — L97312 Non-pressure chronic ulcer of right ankle with fat layer exposed: Secondary | ICD-10-CM | POA: Diagnosis not present

## 2020-01-25 DIAGNOSIS — J449 Chronic obstructive pulmonary disease, unspecified: Secondary | ICD-10-CM | POA: Insufficient documentation

## 2020-01-25 DIAGNOSIS — E11621 Type 2 diabetes mellitus with foot ulcer: Secondary | ICD-10-CM | POA: Diagnosis not present

## 2020-01-25 DIAGNOSIS — I890 Lymphedema, not elsewhere classified: Secondary | ICD-10-CM | POA: Insufficient documentation

## 2020-01-25 NOTE — Progress Notes (Signed)
[name] ([id number]) Visit Report for 01/25/2020 Chief Complaint Document Details Patient Name: Date of Service: [name] [place], [place] [name] 01/25/2020 10:30 A M Medical Record Number: [id number] Patient Account Number: [id number] Date of Birth/Sex: Treating RN: [birth year] (74 y.o. [name] Primary Care Provider: Cathlean [name] Other Clinician: Referring Provider: Treating Provider/Extender: [name] in Treatment: 0 Information Obtained from: Patient Chief Complaint Right ankle ulcer Electronic Signature(s) Signed: 01/25/2020 11:51:09 AM By: Parker Keeler PA-C Previous Signature: 01/25/2020 11:38:33 AM Version By: Parker Keeler PA-C Entered By: [name] on 01/25/2020 11:51:09 -------------------------------------------------------------------------------- Debridement Details Patient Name: Date of Service: [name] ND, [place] [name] 01/25/2020 10:30 A M Medical Record Number: [id number] Patient Account Number: [id number] Date of Birth/Sex: Treating RN: [birth year] (74 y.o. [name] Primary Care Provider: Cathlean [name] Other Clinician: Referring Provider: Treating Provider/Extender: [name] in Treatment: 0 Debridement Performed for Assessment: Wound #5 Right,Dorsal Foot Performed By: Physician Parker Keeler, PA Debridement Type: Debridement Severity of Tissue Pre Debridement: Fat layer exposed Level of Consciousness (Pre-procedure): Awake and Alert Pre-procedure Verification/Time Out Yes - 11:40 Taken: Start Time: 11:42 Pain Control: Lidocaine 4% T opical Solution T Area Debrided (L x W): otal 0.5 (cm) x 1.5 (cm) = 0.75 (cm) Tissue and other material debrided: Viable, Non-Viable, Slough, Subcutaneous, Slough Level: Skin/Subcutaneous Tissue Debridement Description: Excisional Instrument: Curette Bleeding: Minimum Hemostasis Achieved: Pressure End Time: 11:44 Procedural Pain: 0 Post Procedural Pain:  0 Response to Treatment: Procedure was tolerated well Level of Consciousness (Post- Awake and Alert procedure): Post Debridement Measurements of Total Wound Length: (cm) 0.5 Width: (cm) 1.5 Depth: (cm) 0.1 Volume: (cm) 0.059 Character of Wound/Ulcer Post Debridement: Improved Severity of Tissue Post Debridement: Fat layer exposed Post Procedure Diagnosis Same as Pre-procedure Electronic Signature(s) Signed: 01/25/2020 5:11:14 PM By: Parker Keeler PA-C Signed: 01/25/2020 5:28:41 PM By: Parker Gouty RN, BSN Entered By: [name] on 01/25/2020 11:45:25 -------------------------------------------------------------------------------- HPI Details Patient Name: Date of Service: [name] ND, [place] [name] 01/25/2020 10:30 A M Medical Record Number: [id number] Patient Account Number: [id number] Date of Birth/Sex: Treating RN: 1945/11/01 (74 y.o. [name] Primary Care Provider: Cathlean [name] Other Clinician: Referring Provider: Treating Provider/Extender: [name] in Treatment: 0 History of Present Illness HPI Description: 10/19/2019 upon evaluation today patient appears to be doing somewhat poorly upon initial evaluation here in our clinic. He tells me that he has really an undisclosed amount of time that he has been dealing with issues with his legs bilaterally. The left leg swells minimally the right leg has wounds and unfortunately does seem to swell much more significantly. Fortunately there is no evidence of active infection at this time. No fevers, chills, nausea, vomiting, or diarrhea. With that being said the patient tells me that he does have a history of diabetes with his most recent hemoglobin A1c being 7.1 that was on 10/06/2019. He has been using Neosporin over the area. He also has what appears to be lymphedema stage III, chronic venous insufficiency, diabetes mellitus type 2, hypertension, COPD, paroxysmal atrial fibrillation, and long-term use  of anticoagulant therapy. The patient really does not know how long the wounds currently have been open although he tells me "months". 10/26/2019 on evaluation today patient appears to be doing excellent in regard to his right lower extremity. He just has a couple small areas remaining open at this point the majority of the wounds have all healed. Fortunately  there is no signs of active infection. He does have a compression sock on the left lower extremity however the sock does have holes in it I think it is time for some new ones I think he would do well to get some from elastic therapy in [facility] as they will be much more inexpensive but at the same time excellent quality 11/02/2019 upon evaluation today patient appears to be doing very well with regard to his right lower extremity. Fortunately there is no evidence of active infection which is good news. Overall I think that he has done extremely well at this point. Readmission: 01/25/2020 upon evaluation today patient presents for reevaluation here in our clinic concerning initially what appeared to be a blister over his lower extremity that is the main reason he made the appointment. Unfortunately he ended up with a wound where his compression socks were bulging off right at the ankle area as well and that has been a bigger problem for him. He states that they really hurt him in that regard. He has never had Velcro compression socks although I think that may be something that would be beneficial for him. Fortunately there is no signs of active infection at this time. Electronic Signature(s) Signed: 01/25/2020 1:01:31 PM By: Parker Keeler PA-C Entered By: [name] on 01/25/2020 13:01:31 -------------------------------------------------------------------------------- Physical Exam Details Patient Name: Date of Service: [name] ND, CO [name] 01/25/2020 10:30 A M Medical Record Number: [id number] Patient Account Number:  [id number] Date of Birth/Sex: Treating RN: 02-Sep-1945 (74 y.o. [name] Primary Care Provider: Cathlean [name] Other Clinician: Referring Provider: Treating Provider/Extender: [name] in Treatment: 0 Constitutional patient is hypertensive.. pulse regular and within target range for patient.Marland Kitchen respirations regular, non-labored and within target range for patient.Marland Kitchen temperature within target range for patient.. Well-nourished and well-hydrated in no acute distress. Eyes conjunctiva clear no eyelid edema noted. pupils equal round and reactive to light and accommodation. Ears, Nose, Mouth, and Throat no gross abnormality of ear auricles or external auditory canals. normal hearing noted during conversation. mucus membranes moist. Respiratory normal breathing without difficulty. Cardiovascular 2+ dorsalis pedis/posterior tibialis pulses. 3+ pitting edema of the bilateral lower extremities. Musculoskeletal Patient unable to walk without assistance. no significant deformity or arthritic changes, no loss or range of motion, no clubbing. Psychiatric this patient is able to make decisions and demonstrates good insight into disease process. Alert and Oriented x 3. pleasant and cooperative. Notes Upon inspection patient's wound bed at the ankle region anteriorly did require sharp debridement to clear away some of the necrotic debris in the surface of the wound. He tolerated this debridement today without complication and post debridement the wound bed appears to be doing much better. Electronic Signature(s) Signed: 01/25/2020 1:02:09 PM By: Parker Keeler PA-C Entered By: [name] on 01/25/2020 13:02:09 -------------------------------------------------------------------------------- Physician Orders Details Patient Name: Date of Service: [name] ND, CO [name] 01/25/2020 10:30 A M Medical Record Number: [id number] Patient Account Number: [id number] Date of  Birth/Sex: Treating RN: 1946/05/29 (74 y.o. [name] Primary Care Provider: Cathlean [name] Other Clinician: Referring Provider: Treating Provider/Extender: [name] in Treatment: 0 Verbal / Phone Orders: No Diagnosis Coding ICD-10 Coding Code Description E11.621 Type 2 diabetes mellitus with foot ulcer L97.512 Non-pressure chronic ulcer of other part of right foot with fat layer exposed I50.42 Chronic combined systolic (congestive) and diastolic (congestive) heart failure J44.9 Chronic obstructive pulmonary disease, unspecified Follow-up  Appointments ppointment in 2 weeks. - with Margarita Grizzle Return A Nurse Visit: - 1 week Dressing Change Frequency Wound #5 Right,Anterior Ankle Do not change entire dressing for one week. Skin Barriers/Peri-Wound Care Moisturizing lotion Wound Cleansing Wound #5 Right,Anterior Ankle May shower with protection. Primary Wound Dressing Wound #5 Right,Anterior Ankle Silver Collagen Secondary Dressing Wound #5 Right,Anterior Ankle Dry Gauze Edema Control 4 layer compression - Right Lower Extremity Avoid standing for long periods of time Elevate legs to the level of the heart or above for 30 minutes daily and/or when sitting, a frequency of: Exercise regularly Electronic Signature(s) Signed: 01/25/2020 5:11:14 PM By: Parker Keeler PA-C Signed: 01/25/2020 5:28:41 PM By: Parker Gouty RN, BSN Entered By: [name] on 01/25/2020 11:50:02 -------------------------------------------------------------------------------- Problem List Details Patient Name: Date of Service: [name] ND, CO [name] 01/25/2020 10:30 A M Medical Record Number: [id number] Patient Account Number: [id number] Date of Birth/Sex: Treating RN: [birth year] (74 y.o. [name] Primary Care Provider: Cathlean [name] Other Clinician: Referring Provider: Treating Provider/Extender: [name] in Treatment: 0 Active  Problems ICD-10 Encounter Code Description Active Date MDM Diagnosis I87.2 Venous insufficiency (chronic) (peripheral) 01/25/2020 No Yes I89.0 Lymphedema, not elsewhere classified 01/25/2020 No Yes L97.312 Non-pressure chronic ulcer of right ankle with fat layer exposed 01/25/2020 No Yes E11.622 Type 2 diabetes mellitus with other skin ulcer 01/25/2020 No Yes I50.42 Chronic combined systolic (congestive) and diastolic (congestive) heart failure 01/25/2020 No Yes J44.9 Chronic obstructive pulmonary disease, unspecified 01/25/2020 No Yes I48.0 Paroxysmal atrial fibrillation 01/25/2020 No Yes Z79.01 Long term (current) use of anticoagulants 01/25/2020 No Yes Inactive Problems Resolved Problems Electronic Signature(s) Signed: 01/25/2020 11:50:43 AM By: Parker Keeler PA-C Previous Signature: 01/25/2020 11:38:26 AM Version By: Parker Keeler PA-C Entered By: [name] on 01/25/2020 11:50:42 -------------------------------------------------------------------------------- Progress Note Details Patient Name: Date of Service: [name] ND, [place] [name] 01/25/2020 10:30 A M Medical Record Number: [id number] Patient Account Number: [id number] Date of Birth/Sex: Treating RN: [birth year] (74 y.o. [name] Primary Care Provider: Cathlean [name] Other Clinician: Referring Provider: Treating Provider/Extender: [name] in Treatment: 0 Subjective Chief Complaint Information obtained from Patient Right ankle ulcer History of Present Illness (HPI) 10/19/2019 upon evaluation today patient appears to be doing somewhat poorly upon initial evaluation here in our clinic. He tells me that he has really an undisclosed amount of time that he has been dealing with issues with his legs bilaterally. The left leg swells minimally the right leg has wounds and unfortunately does seem to swell much more significantly. Fortunately there is no evidence of active infection at this time. No fevers, chills,  nausea, vomiting, or diarrhea. With that being said the patient tells me that he does have a history of diabetes with his most recent hemoglobin A1c being 7.1 that was on 10/06/2019. He has been using Neosporin over the area. He also has what appears to be lymphedema stage III, chronic venous insufficiency, diabetes mellitus type 2, hypertension, COPD, paroxysmal atrial fibrillation, and long-term use of anticoagulant therapy. The patient really does not know how long the wounds currently have been open although he tells me "months". 10/26/2019 on evaluation today patient appears to be doing excellent in regard to his right lower extremity. He just has a couple small areas remaining open at this point the majority of the wounds have all healed. Fortunately there is no signs of active infection. He does have a compression sock on the left lower extremity  however the sock does have holes in it I think it is time for some new ones I think he would do well to get some from elastic therapy in [facility] as they will be much more inexpensive but at the same time excellent quality 11/02/2019 upon evaluation today patient appears to be doing very well with regard to his right lower extremity. Fortunately there is no evidence of active infection which is good news. Overall I think that he has done extremely well at this point. Readmission: 01/25/2020 upon evaluation today patient presents for reevaluation here in our clinic concerning initially what appeared to be a blister over his lower extremity that is the main reason he made the appointment. Unfortunately he ended up with a wound where his compression socks were bulging off right at the ankle area as well and that has been a bigger problem for him. He states that they really hurt him in that regard. He has never had Velcro compression socks although I think that may be something that would be beneficial for him. Fortunately there is no signs of  active infection at this time. Patient History Information obtained from Patient. Allergies ciprofloxacin (Severity: Moderate, Reaction: generalized weakness) Family History Heart Disease - Mother,Siblings, Hypertension - Mother,Father,Siblings, No family history of Cancer, Diabetes, Hereditary Spherocytosis, Kidney Disease, Lung Disease, Seizures, Stroke, Thyroid Problems, Tuberculosis. Social History Former smoker - quit 40 years ago, Marital Status - Divorced, Alcohol Use - Never, Drug Use - No History, Caffeine Use - Moderate - Coffee. Medical History Respiratory Patient has history of Chronic Obstructive Pulmonary Disease (COPD) Denies history of Aspiration, Asthma, Pneumothorax, Sleep Apnea, Tuberculosis Cardiovascular Patient has history of Arrhythmia - A-Fib, Hypertension Denies history of Angina, Congestive Heart Failure, Coronary Artery Disease, Deep Vein Thrombosis, Hypotension, Myocardial Infarction, Peripheral Arterial Disease, Peripheral Venous Disease, Phlebitis, Vasculitis Endocrine Patient has history of Type II Diabetes Denies history of Type I Diabetes Integumentary (Skin) Denies history of History of Burn Musculoskeletal Patient has history of Osteoarthritis Denies history of Gout, Rheumatoid Arthritis Neurologic Patient has history of Neuropathy Denies history of Dementia, Quadriplegia, Paraplegia, Seizure Disorder Patient is treated with Oral Agents. Blood sugar is tested. Medical A Surgical History Notes nd Cardiovascular Hyperlipidemia Gastrointestinal GERD Genitourinary BPH, Renal Cyst Review of Systems (ROS) Constitutional Symptoms (General Health) Denies complaints or symptoms of Fatigue, Fever, Chills, Marked Weight Change. Ear/Nose/Mouth/Throat Denies complaints or symptoms of Chronic sinus problems or rhinitis. Respiratory Denies complaints or symptoms of Chronic or frequent coughs, Shortness of Breath. Cardiovascular Denies complaints or  symptoms of Chest pain. Gastrointestinal Denies complaints or symptoms of Frequent diarrhea, Nausea, Vomiting. Endocrine Denies complaints or symptoms of Heat/cold intolerance. Genitourinary Denies complaints or symptoms of Frequent urination. Integumentary (Skin) Complains or has symptoms of Wounds. Musculoskeletal Denies complaints or symptoms of Muscle Pain, Muscle Weakness. Neurologic Denies complaints or symptoms of Numbness/parasthesias. Psychiatric Denies complaints or symptoms of Claustrophobia, Suicidal. Objective Constitutional patient is hypertensive.. pulse regular and within target range for patient.Marland Kitchen respirations regular, non-labored and within target range for patient.Marland Kitchen temperature within target range for patient.. Well-nourished and well-hydrated in no acute distress. Vitals Time Taken: 11:01 AM, Height: 73 in, Source: Stated, Weight: 275 lbs, Source: Stated, BMI: 36.3, Temperature: 97.8 F, Pulse: 86 bpm, Respiratory Rate: 18 breaths/min, Blood Pressure: 178/90 mmHg. Eyes conjunctiva clear no eyelid edema noted. pupils equal round and reactive to light and accommodation. Ears, Nose, Mouth, and Throat no gross abnormality of ear auricles or external auditory canals. normal hearing noted during  conversation. mucus membranes moist. Respiratory normal breathing without difficulty. Cardiovascular 2+ dorsalis pedis/posterior tibialis pulses. 3+ pitting edema of the bilateral lower extremities. Musculoskeletal Patient unable to walk without assistance. no significant deformity or arthritic changes, no loss or range of motion, no clubbing. Psychiatric this patient is able to make decisions and demonstrates good insight into disease process. Alert and Oriented x 3. pleasant and cooperative. General Notes: Upon inspection patient's wound bed at the ankle region anteriorly did require sharp debridement to clear away some of the necrotic debris in the surface of the wound. He  tolerated this debridement today without complication and post debridement the wound bed appears to be doing much better. Integumentary (Hair, Skin) Wound #5 status is Open. Original cause of wound was Gradually Appeared. The wound is located on the Right,Anterior Ankle. The wound measures 0.5cm length x 1.5cm width x 0.1cm depth; 0.589cm^2 area and 0.059cm^3 volume. There is Fat Layer (Subcutaneous Tissue) Exposed exposed. There is no tunneling or undermining noted. There is a medium amount of serosanguineous drainage noted. The wound margin is flat and intact. There is medium (34-66%) red, pink granulation within the wound bed. There is a medium (34-66%) amount of necrotic tissue within the wound bed including Adherent Slough. Assessment Active Problems ICD-10 Venous insufficiency (chronic) (peripheral) Lymphedema, not elsewhere classified Non-pressure chronic ulcer of right ankle with fat layer exposed Type 2 diabetes mellitus with other skin ulcer Chronic combined systolic (congestive) and diastolic (congestive) heart failure Chronic obstructive pulmonary disease, unspecified Paroxysmal atrial fibrillation Long term (current) use of anticoagulants Procedures Wound #5 Pre-procedure diagnosis of Wound #5 is a Diabetic Wound/Ulcer of the Lower Extremity located on the Right,Dorsal Foot .Severity of Tissue Pre Debridement is: Fat layer exposed. There was a Excisional Skin/Subcutaneous Tissue Debridement with a total area of 0.75 sq cm performed by Parker Keeler, PA. With the following instrument(s): Curette to remove Viable and Non-Viable tissue/material. Material removed includes Subcutaneous Tissue and Slough and after achieving pain control using Lidocaine 4% T opical Solution. No specimens were taken. A time out was conducted at 11:40, prior to the start of the procedure. A Minimum amount of bleeding was controlled with Pressure. The procedure was tolerated well with a pain level of 0  throughout and a pain level of 0 following the procedure. Post Debridement Measurements: 0.5cm length x 1.5cm width x 0.1cm depth; 0.059cm^3 volume. Character of Wound/Ulcer Post Debridement is improved. Severity of Tissue Post Debridement is: Fat layer exposed. Post procedure Diagnosis Wound #5: Same as Pre-Procedure Pre-procedure diagnosis of Wound #5 is a Diabetic Wound/Ulcer of the Lower Extremity located on the Right,Dorsal Foot . There was a Four Layer Compression Therapy Procedure by Kela Millin, RN. Post procedure Diagnosis Wound #5: Same as Pre-Procedure Plan Follow-up Appointments: Return Appointment in 2 weeks. - with Margarita Grizzle Nurse Visit: - 1 week Dressing Change Frequency: Wound #5 Right,Anterior Ankle: Do not change entire dressing for one week. Skin Barriers/Peri-Wound Care: Moisturizing lotion Wound Cleansing: Wound #5 Right,Anterior Ankle: May shower with protection. Primary Wound Dressing: Wound #5 Right,Anterior Ankle: Silver Collagen Secondary Dressing: Wound #5 Right,Anterior Ankle: Dry Gauze Edema Control: 4 layer compression - Right Lower Extremity Avoid standing for long periods of time Elevate legs to the level of the heart or above for 30 minutes daily and/or when sitting, a frequency of: Exercise regularly 1. I would suggest currently that we go ahead and initiate treatment with a 4-layer compression wrap which previously was extremely beneficial for him. The patient is  in agreement with that plan he tolerated the wrap in the past without complication. 2. I am also going to suggest that we go ahead and initiate a collagen dressing to the wound bed which I think will do well for him. 3. I am also going to suggest that the patient needs to elevate his legs much as possible we will work on getting a Velcro compression wrap as well to see if that will be beneficial and not because of the discomfort he experienced with his compression socks. We will see  patient back for reevaluation in 2 weeks here in the clinic. If anything worsens or changes patient will contact our office for additional recommendations. We will see him in 1 week for nurse visit. Electronic Signature(s) Signed: 01/25/2020 1:02:36 PM By: Parker Keeler PA-C Entered By: [name] on 01/25/2020 13:02:36 -------------------------------------------------------------------------------- HxROS Details Patient Name: Date of Service: [name] ND, [place] [name] 01/25/2020 10:30 A M Medical Record Number: [id number] Patient Account Number: [id number] Date of Birth/Sex: Treating RN: May 23, 1946 (74 y.o. [name]) Carlene Coria Primary Care Provider: Cathlean [name] Other Clinician: Referring Provider: Treating Provider/Extender: [name] in Treatment: 0 Information Obtained From Patient Constitutional Symptoms (General Health) Complaints and Symptoms: Negative for: Fatigue; Fever; Chills; Marked Weight Change Ear/Nose/Mouth/Throat Complaints and Symptoms: Negative for: Chronic sinus problems or rhinitis Respiratory Complaints and Symptoms: Negative for: Chronic or frequent coughs; Shortness of Breath Medical History: Positive for: Chronic Obstructive Pulmonary Disease (COPD) Negative for: Aspiration; Asthma; Pneumothorax; Sleep Apnea; Tuberculosis Cardiovascular Complaints and Symptoms: Negative for: Chest pain Medical History: Positive for: Arrhythmia - A-Fib; Hypertension Negative for: Angina; Congestive Heart Failure; Coronary Artery Disease; Deep Vein Thrombosis; Hypotension; Myocardial Infarction; Peripheral Arterial Disease; Peripheral Venous Disease; Phlebitis; Vasculitis Past Medical History Notes: Hyperlipidemia Gastrointestinal Complaints and Symptoms: Negative for: Frequent diarrhea; Nausea; Vomiting Medical History: Past Medical History Notes: GERD Endocrine Complaints and Symptoms: Negative for: Heat/cold intolerance Medical  History: Positive for: Type II Diabetes Negative for: Type I Diabetes Time with diabetes: 2 years Treated with: Oral agents Blood sugar tested every day: Yes Tested : once a day Genitourinary Complaints and Symptoms: Negative for: Frequent urination Medical History: Past Medical History Notes: BPH, Renal Cyst Integumentary (Skin) Complaints and Symptoms: Positive for: Wounds Medical History: Negative for: History of Burn Musculoskeletal Complaints and Symptoms: Negative for: Muscle Pain; Muscle Weakness Medical History: Positive for: Osteoarthritis Negative for: Gout; Rheumatoid Arthritis Neurologic Complaints and Symptoms: Negative for: Numbness/parasthesias Medical History: Positive for: Neuropathy Negative for: Dementia; Quadriplegia; Paraplegia; Seizure Disorder Psychiatric Complaints and Symptoms: Negative for: Claustrophobia; Suicidal Hematologic/Lymphatic Immunological Oncologic Immunizations Pneumococcal Vaccine: Received Pneumococcal Vaccination: Yes Implantable Devices None Family and Social History Cancer: No; Diabetes: No; Heart Disease: Yes - Mother,Siblings; Hereditary Spherocytosis: No; Hypertension: Yes - Mother,Father,Siblings; Kidney Disease: No; Lung Disease: No; Seizures: No; Stroke: No; Thyroid Problems: No; Tuberculosis: No; Former smoker - quit 40 years ago; Marital Status - Divorced; Alcohol Use: Never; Drug Use: No History; Caffeine Use: Moderate - Coffee; Financial Concerns: No; Food, Clothing or Shelter Needs: No; Support System Lacking: No; Transportation Concerns: No Electronic Signature(s) Signed: 01/25/2020 5:11:14 PM By: Parker Keeler PA-C Signed: 01/25/2020 5:22:20 PM By: Carlene Coria RN Entered By: Carlene Coria on 01/25/2020 11:28:14 -------------------------------------------------------------------------------- SuperBill Details Patient Name: Date of Service: [name] [place], [place] [name] 01/25/2020 Medical Record Number:  [id number] Patient Account Number: [id number] Date of Birth/Sex: Treating RN: August 19, 1945 (74 y.o. 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 E11.622 Type 2 diabetes mellitus with other skin ulcer I50.42 Chronic combined systolic (congestive) and diastolic (congestive) heart failure J44.9 Chronic obstructive pulmonary disease, unspecified I48.0 Paroxysmal atrial fibrillation Z79.01 Long term (current) use of anticoagulants Facility Procedures CPT4 Code: 29191660 Description: 99213 - WOUND CARE VISIT-LEV 3 EST PT Modifier: 25 Quantity: 1 CPT4 Code: 60045997 Description: 74142 - DEB SUBQ TISSUE 20 SQ CM/< ICD-10 Diagnosis Description L95.320 Non-pressure chronic ulcer of right ankle with fat layer exposed Modifier: Quantity: 1 Physician Procedures : CPT4 Code Description Modifier [id number] 86168 - WC PHYS LEVEL 3 - EST PT 25 ICD-10 Diagnosis Description I87.2 Venous insufficiency (chronic) (peripheral) I89.0 Lymphedema, not elsewhere classified L97.312 Non-pressure chronic ulcer of right ankle with  fat layer exposed E11.622 Type 2 diabetes mellitus with other skin ulcer Quantity: 1 : 3729021 11042 - WC PHYS SUBQ TISS 20 SQ CM ICD-10 Diagnosis Description J15.520 Non-pressure chronic ulcer of right ankle with fat layer exposed Quantity: 1 Electronic Signature(s) Signed: 01/25/2020 11:55:44 AM By: Parker Keeler PA-C Entered By: [name] on 01/25/2020 11:55:44

## 2020-01-25 NOTE — Progress Notes (Signed)
[name] ([id number]) Visit Report for 01/25/2020 Allergy List Details Patient Name: Date of Service: [place] S[place], [place] [name] 01/25/2020 10:30 A M Medical Record Number: [id number] Patient Account Number: [id number] Date of Birth/Sex: Treating RN: Sep 23, 1945 (74 y.o. [name]) Carlene Coria Primary Care Provider: Cathlean Cower Other Clinician: Referring Provider: Treating Provider/Extender: Roque Cash in Treatment: 0 Allergies Active Allergies ciprofloxacin Reaction: generalized weakness Severity: Moderate Allergy Notes Electronic Signature(s) Signed: 01/25/2020 5:22:20 PM By: Carlene Coria RN Entered By: Carlene Coria on 01/25/2020 11:03:20 -------------------------------------------------------------------------------- Arrival Information Details Patient Name: Date of Service: [place]ville Silos [place], [place] 01/25/2020 10:30 A M Medical Record Number: [id number] Patient Account Number: [id number] Date of Birth/Sex: Treating RN: [birth year] (74 y.o. [name]) Carlene Coria Primary Care Provider: Cathlean Cower Other Clinician: Referring Provider: Treating Provider/Extender: Roque Cash in Treatment: 0 Visit Information Patient Arrived: Wheel Chair Arrival Time: 10:45 Accompanied By: daughter Transfer Assistance: None Patient Identification Verified: Yes Secondary Verification Process Completed: Yes Patient Requires Transmission-Based Precautions: No Patient Has Alerts: Yes Patient Alerts: Patient on Blood Thinner ABI 1.07 right History Since Last Visit All ordered tests and consults were completed: No Added or deleted any medications: No Any new allergies or adverse reactions: No Had a fall or experienced change in activities of daily living that may affect risk of falls: No Signs or symptoms of abuse/neglect since last visito No Hospitalized since last visit: No Implantable device outside of the clinic excluding cellular tissue based products placed  in the center since last visit: No Electronic Signature(s) Signed: 01/25/2020 5:22:20 PM By: Carlene Coria RN Entered By: Carlene Coria on 01/25/2020 11:27:22 -------------------------------------------------------------------------------- Clinic Level of Care Assessment Details Patient Name: Date of Service: Grandville Silos [place], [place] 01/25/2020 10:30 A M Medical Record Number: [id number] Patient Account Number: [id number] Date of Birth/Sex: Treating RN: March 29, 1946 (74 y.o. [name] Primary Care Provider: Cathlean Cower Other Clinician: Referring Provider: Treating Provider/Extender: Roque Cash in Treatment: 0 Clinic Level of Care Assessment Items TOOL 1 Quantity Score []  - 0 Use when EandM and Procedure is performed on INITIAL visit ASSESSMENTS - Nursing Assessment / Reassessment X- 1 20 General Physical Exam (combine w/ comprehensive assessment (listed just below) when performed on new pt. evals) X- 1 25 Comprehensive Assessment (HX, ROS, Risk Assessments, Wounds Hx, etc.) ASSESSMENTS - Wound and Skin Assessment / Reassessment []  - 0 Dermatologic / Skin Assessment (not related to wound area) ASSESSMENTS - Ostomy and/or Continence Assessment and Care []  - 0 Incontinence Assessment and Management []  - 0 Ostomy Care Assessment and Management (repouching, etc.) PROCESS - Coordination of Care X - Simple Patient / Family Education for ongoing care 1 15 []  - 0 Complex (extensive) Patient / Family Education for ongoing care X- 1 10 Staff obtains Programmer, systems, Records, T Results / Process Orders est []  - 0 Staff telephones HHA, Nursing Homes / Clarify orders / etc []  - 0 Routine Transfer to another Facility (non-emergent condition) []  - 0 Routine Hospital Admission (non-emergent condition) X- 1 15 New Admissions / Biomedical engineer / Ordering NPWT Apligraf, etc. , []  - 0 Emergency Hospital Admission (emergent condition) PROCESS - Special Needs []  -  0 Pediatric / Minor Patient Management []  - 0 Isolation Patient Management []  - 0 Hearing / Language / Visual special needs []  - 0 Assessment of Community assistance (transportation, D/C planning, etc.) []  - 0 Additional assistance / Altered mentation []  - 0 Support Surface(s) Assessment (bed, cushion,  seat, etc.) INTERVENTIONS - Miscellaneous []  - 0 External ear exam []  - 0 Patient Transfer (multiple staff / Civil Service fast streamer / Similar devices) []  - 0 Simple Staple / Suture removal (25 or less) []  - 0 Complex Staple / Suture removal (26 or more) []  - 0 Hypo/Hyperglycemic Management (do not check if billed separately) []  - 0 Ankle / Brachial Index (ABI) - do not check if billed separately Has the patient been seen at the hospital within the last three years: Yes Total Score: 85 Level Of Care: New/Established - Level 3 Electronic Signature(s) Signed: 01/25/2020 5:28:41 PM By: Baruch Gouty RN, BSN Signed: 01/25/2020 5:28:41 PM By: Baruch Gouty RN, BSN Entered By: Baruch Gouty on 01/25/2020 11:43:41 -------------------------------------------------------------------------------- Compression Therapy Details Patient Name: Date of Service: [place] S[place], [place] [name] 01/25/2020 10:30 A M Medical Record Number: [id number] Patient Account Number: [id number] Date of Birth/Sex: Treating RN: Feb 18, 1946 (74 y.o. [name] Primary Care Provider: Cathlean Cower Other Clinician: Referring Provider: Treating Provider/Extender: Roque Cash in Treatment: 0 Compression Therapy Performed for Wound Assessment: Wound #5 Right,Dorsal Foot Performed By: Clinician Kela Millin, RN Compression Type: Four Layer Post Procedure Diagnosis Same as Pre-procedure Electronic Signature(s) Signed: 01/25/2020 5:28:41 PM By: Baruch Gouty RN, BSN Entered By: Baruch Gouty on 01/25/2020  11:44:03 -------------------------------------------------------------------------------- Encounter Discharge Information Details Patient Name: Date of Service: Grandville Silos [place], [place] 01/25/2020 10:30 A M Medical Record Number: [id number] Patient Account Number: [id number] Date of Birth/Sex: Treating RN: 11/26/1945 (74 y.o. [name] Primary Care Provider: Cathlean Cower Other Clinician: Referring Provider: Treating Provider/Extender: Roque Cash in Treatment: 0 Encounter Discharge Information Items Post Procedure Vitals Discharge Condition: Stable Temperature (F): 97.8 Ambulatory Status: Wheelchair Pulse (bpm): 86 Discharge Destination: Home Respiratory Rate (breaths/min): 18 Transportation: Private Auto Blood Pressure (mmHg): 178/90 Accompanied By: daughter Schedule Follow-up Appointment: Yes Clinical Summary of Care: Patient Declined Electronic Signature(s) Signed: 01/25/2020 5:12:52 PM By: Kela Millin Entered By: Kela Millin on 01/25/2020 12:12:29 -------------------------------------------------------------------------------- Lower Extremity Assessment Details Patient Name: Date of Service: [place] Silos [place], [place] [name] 01/25/2020 10:30 A M Medical Record Number: [id number] Patient Account Number: [id number] Date of Birth/Sex: Treating RN: 1945/12/24 (74 y.o. [name]) Carlene Coria Primary Care Provider: Cathlean Cower Other Clinician: Referring Provider: Treating Provider/Extender: Roque Cash in Treatment: 0 Edema Assessment Assessed: [Left: No] [Right: No] E[Left: dema] [Right: :] Calf Left: Right: Point of Measurement: 47 cm From Medial Instep cm 41 cm Ankle Left: Right: Point of Measurement: 10 cm From Medial Instep cm 33 cm Electronic Signature(s) Signed: 01/25/2020 5:22:20 PM By: Carlene Coria RN Entered By: Carlene Coria on 01/25/2020  11:26:25 -------------------------------------------------------------------------------- Multi-Disciplinary Care Plan Details Patient Name: Date of Service: [place], [place] [name] 01/25/2020 10:30 A M Medical Record Number: [id number] Patient Account Number: [id number] Date of Birth/Sex: Treating RN: Jul 01, 1946 (74 y.o. [name] Primary Care Provider: Cathlean Cower Other Clinician: Referring Provider: Treating Provider/Extender: Roque Cash in Treatment: 0 Active Inactive Venous Leg Ulcer Nursing Diagnoses: Knowledge deficit related to disease process and management Potential for venous Insuffiency (use before diagnosis confirmed) Goals: Patient will maintain optimal edema control Date Initiated: 01/25/2020 Target Resolution Date: 02/22/2020 Goal Status: Active Interventions: Assess peripheral edema status every visit. Compression as ordered Treatment Activities: Therapeutic compression applied : 01/25/2020 Notes: Wound/Skin Impairment Nursing Diagnoses: Impaired tissue integrity Knowledge deficit related to ulceration/compromised skin integrity Goals: Patient/caregiver will verbalize understanding of skin care regimen Date  Initiated: 01/25/2020 Target Resolution Date: 02/22/2020 Goal Status: Active Ulcer/skin breakdown will have a volume reduction of 30% by week 4 Date Initiated: 01/25/2020 Target Resolution Date: 02/22/2020 Goal Status: Active Interventions: Assess patient/caregiver ability to obtain necessary supplies Assess patient/caregiver ability to perform ulcer/skin care regimen upon admission and as needed Assess ulceration(s) every visit Provide education on ulcer and skin care Treatment Activities: Skin care regimen initiated : 01/25/2020 Topical wound management initiated : 01/25/2020 Notes: Electronic Signature(s) Signed: 01/25/2020 5:28:41 PM By: Baruch Gouty RN, BSN Entered By: Baruch Gouty on 01/25/2020  11:41:26 -------------------------------------------------------------------------------- Pain Assessment Details Patient Name: Date of Service: [place]ville Silos [place], [place] 01/25/2020 10:30 A M Medical Record Number: [id number] Patient Account Number: [id number] Date of Birth/Sex: Treating RN: 1946-07-17 (74 y.o. [name]) Carlene Coria Primary Care Provider: Cathlean Cower Other Clinician: Referring Provider: Treating Provider/Extender: Roque Cash in Treatment: 0 Active Problems Location of Pain Severity and Description of Pain Patient Has Paino No Site Locations Pain Management and Medication Current Pain Management: Electronic Signature(s) Signed: 01/25/2020 5:22:20 PM By: Carlene Coria RN Entered By: Carlene Coria on 01/25/2020 11:40:03 -------------------------------------------------------------------------------- Patient/Caregiver Education Details Patient Name: Date of Service: Grandville Silos [place], [place] 7/7/2021andnbsp10:30 [place] Record Number: [id number] Patient Account Number: [id number] Date of Birth/Gender: Treating RN: [birth year] (74 y.o. [name] Primary Care Physician: Cathlean Cower Other Clinician: Referring Physician: Treating Physician/Extender: Roque Cash in Treatment: 0 Education Assessment Education Provided To: Patient Education Topics Provided Venous: Methods: Explain/Verbal Responses: Reinforcements needed, State content correctly Wound/Skin Impairment: Methods: Explain/Verbal Responses: Reinforcements needed, State content correctly Electronic Signature(s) Signed: 01/25/2020 5:28:41 PM By: Baruch Gouty RN, BSN Entered By: Baruch Gouty on 01/25/2020 11:42:36 -------------------------------------------------------------------------------- Wound Assessment Details Patient Name: Date of Service: Grandville Silos [place], [place] 01/25/2020 10:30 A M Medical Record Number: [id number] Patient Account Number:  [id number] Date of Birth/Sex: Treating RN: [birth year] (74 y.o. [name]) Carlene Coria Primary Care Provider: Cathlean Cower Other Clinician: Referring Provider: Treating Provider/Extender: Roque Cash in Treatment: 0 Wound Status Wound Number: 5 Primary Diabetic Wound/Ulcer of the Lower Extremity Etiology: Wound Location: Right, Dorsal Foot Wound Open Wounding Event: Gradually Appeared Status: Date Acquired: 12/20/2019 Comorbid Chronic Obstructive Pulmonary Disease (COPD), Arrhythmia, Weeks Of Treatment: 0 History: Hypertension, Type II Diabetes, Osteoarthritis, Neuropathy Clustered Wound: No Photos Photo Uploaded By: Mikeal Hawthorne on 01/25/2020 13:59:24 Wound Measurements Length: (cm) 0.5 Width: (cm) 1.5 Depth: (cm) 0.1 Area: (cm) 0.589 Volume: (cm) 0.059 Wound Description Classification: Grade 2 Wound Margin: Flat and Intact Exudate Amount: Medium Exudate Type: Serosanguineous Exudate Color: red, brown Foul Odor After Cleansing: N Slough/Fibrino Y % Reduction in Area: 0% % Reduction in Volume: 0% Epithelialization: None Tunneling: No Undermining: No o es Wound Bed Granulation Amount: Medium (34-66%) Exposed Structure Granulation Quality: Red, Pink Fascia Exposed: No Necrotic Amount: Medium (34-66%) Fat Layer (Subcutaneous Tissue) Exposed: Yes Necrotic Quality: Adherent Slough Tendon Exposed: No Muscle Exposed: No Joint Exposed: No Bone Exposed: No Electronic Signature(s) Signed: 01/25/2020 5:22:20 PM By: Carlene Coria RN Entered By: Carlene Coria on 01/25/2020 11:39:44 -------------------------------------------------------------------------------- Vitals Details Patient Name: Date of Service: [place] S[place], [place] [name] 01/25/2020 10:30 A M Medical Record Number: [id number] Patient Account Number: [id number] Date of Birth/Sex: Treating RN: October 31, 1945 (74 y.o. [name] Primary Care Provider: Cathlean Cower Other Clinician: Referring  Provider: Treating Provider/Extender: Roque Cash in Treatment: 0 Vital Signs Time Taken: 11:01 Temperature (F): 97.8 Height (in): 73  Pulse (bpm): 86 Source: Stated Respiratory Rate (breaths/min): 18 Weight (lbs): 275 Blood Pressure (mmHg): 178/90 Source: Stated Reference Range: 80 - 120 mg / dl Body Mass Index (BMI): 36.3 Electronic Signature(s) Signed: 01/25/2020 5:22:20 PM By: Carlene Coria RN Entered By: Carlene Coria on 01/25/2020 11:03:04

## 2020-01-25 NOTE — Progress Notes (Signed)
ELDWIN, VOLKOV ([id number]) Visit Report for 01/25/2020 Abuse/Suicide Risk Screen Details Patient Name: Date of Service: Grandville Silos [place], [place] 01/25/2020 10:30 A M Medical Record Number: [id number] Patient Account Number: [id number] Date of Birth/Sex: Treating RN: Aug 21, 1945 (74 y.o. [name]) Carlene Coria Primary Care Moishe Schellenberg: Cathlean Cower Other Clinician: Referring Sharonne Ricketts: Treating Kylo Gavin/Extender: Roque Cash in Treatment: 0 Abuse/Suicide Risk Screen Items Answer ABUSE RISK SCREEN: Has anyone close to you tried to hurt or harm you recentlyo No Do you feel uncomfortable with anyone in your familyo No Has anyone forced you do things that you didnt want to doo No Electronic Signature(s) Signed: 01/25/2020 5:22:20 PM By: Carlene Coria RN Entered By: Carlene Coria on 01/25/2020 11:27:45 -------------------------------------------------------------------------------- Activities of Daily Living Details Patient Name: Date of Service: Grandville Silos [place], [place] 01/25/2020 10:30 A M Medical Record Number: [id number] Patient Account Number: [id number] Date of Birth/Sex: Treating RN: [birth year] (74 y.o. [name]) Carlene Coria Primary Care Dedee Liss: Cathlean Cower Other Clinician: Referring Noela Brothers: Treating Deaven Urwin/Extender: Roque Cash in Treatment: 0 Activities of Daily Living Items Answer Activities of Daily Living (Please select one for each item) Drive Automobile Not Able T Medications ake Need Assistance Use T elephone Completely Able Care for Appearance Need Assistance Use T oilet Need Assistance Bath / Shower Need Assistance Dress Self Need Assistance Feed Self Completely Able Walk Need Assistance Get In / Out Bed Need Assistance Housework Not Able Prepare Meals Not Able Handle Money Not Able Shop for Self Not Able Electronic Signature(s) Signed: 01/25/2020 5:22:20 PM By: Carlene Coria RN Entered By: Carlene Coria on 01/25/2020  11:08:42 -------------------------------------------------------------------------------- Education Screening Details Patient Name: Date of Service: Grandville Silos [place], [place] 01/25/2020 10:30 A M Medical Record Number: [id number] Patient Account Number: [id number] Date of Birth/Sex: Treating RN: 01-06-1946 (74 y.o. [name]) Carlene Coria Primary Care Cecila Satcher: Cathlean Cower Other Clinician: Referring Natalynn Pedone: Treating Benji Poynter/Extender: Roque Cash in Treatment: 0 Primary Learner Assessed: Patient Learning Preferences/Education Level/Primary Language Learning Preference: Explanation Highest Education Level: College or Above Preferred Language: English Cognitive Barrier Language Barrier: No Translator Needed: No Memory Deficit: No Emotional Barrier: No Cultural/Religious Beliefs Affecting Medical Care: No Physical Barrier Impaired Vision: No Impaired Hearing: No Decreased Hand dexterity: No Knowledge/Comprehension Knowledge Level: Medium Comprehension Level: Medium Ability to understand written instructions: Medium Ability to understand verbal instructions: Medium Motivation Anxiety Level: Calm Cooperation: Cooperative Education Importance: Acknowledges Need Interest in Health Problems: Asks Questions Perception: Coherent Willingness to Engage in Self-Management High Activities: Readiness to Engage in Self-Management High Activities: Electronic Signature(s) Signed: 01/25/2020 5:22:20 PM By: Carlene Coria RN Entered By: Carlene Coria on 01/25/2020 11:09:15 -------------------------------------------------------------------------------- Fall Risk Assessment Details Patient Name: Date of Service: Grandville Silos [place], [place] 01/25/2020 10:30 A M Medical Record Number: [id number] Patient Account Number: [id number] Date of Birth/Sex: Treating RN: 1945/12/29 (74 y.o. [name]) Carlene Coria Primary Care Jlynn Ly: Cathlean Cower Other Clinician: Referring Vyom Brass: Treating  Jaicion Laurie/Extender: Roque Cash in Treatment: 0 Fall Risk Assessment Items Have you had 2 or more falls in the last 12 monthso 0 No Have you had any fall that resulted in injury in the last 12 monthso 0 No FALLS RISK SCREEN History of falling - immediate or within 3 months 0 No Secondary diagnosis (Do you have 2 or more medical diagnoseso) 0 No Ambulatory aid None/bed rest/wheelchair/nurse 0 No Crutches/cane/walker 0 No Furniture 0 No Intravenous therapy Access/Saline/Heparin Lock 0 No Gait/Transferring Normal/ bed rest/ wheelchair  0 No Weak (short steps with or without shuffle, stooped but able to lift head while walking, may seek 0 No support from furniture) Impaired (short steps with shuffle, may have difficulty arising from chair, head down, impaired 0 No balance) Mental Status Oriented to own ability 0 No Electronic Signature(s) Signed: 01/25/2020 5:22:20 PM By: Carlene Coria RN Entered By: Carlene Coria on 01/25/2020 11:09:40 -------------------------------------------------------------------------------- Foot Assessment Details Patient Name: Date of Service: Grandville Silos [place], [place] 01/25/2020 10:30 A M Medical Record Number: [id number] Patient Account Number: [id number] Date of Birth/Sex: Treating RN: 1946-01-31 (74 y.o. [name]) Carlene Coria Primary Care Barabara Motz: Cathlean Cower Other Clinician: Referring Natahlia Hoggard: Treating Zimir Kittleson/Extender: Roque Cash in Treatment: 0 Foot Assessment Items Site Locations + = Sensation present, - = Sensation absent, C = Callus, U = Ulcer R = Redness, W = Warmth, M = Maceration, PU = Pre-ulcerative lesion F = Fissure, S = Swelling, D = Dryness Assessment Right: Left: Other Deformity: No No Prior Foot Ulcer: No No Prior Amputation: No No Charcot Joint: No No Ambulatory Status: Non-ambulatory Assistance Device: Wheelchair Gait: Administrator, arts) Signed: 01/25/2020 5:22:20 PM By: Carlene Coria RN Entered By: Carlene Coria on 01/25/2020 11:23:01 -------------------------------------------------------------------------------- Nutrition Risk Screening Details Patient Name: Date of Service: [place] S[place], [place] 01/25/2020 10:30 A M Medical Record Number: [id number] Patient Account Number: [id number] Date of Birth/Sex: Treating RN: November 03, 1945 (74 y.o. [name]) Carlene Coria Primary Care Ilyana Manuele: Cathlean Cower Other Clinician: Referring Harrison Paulson: Treating Erion Hermans/Extender: Roque Cash in Treatment: 0 Height (in): 73 Weight (lbs): 275 Body Mass Index (BMI): 36.3 Nutrition Risk Screening Items Score Screening NUTRITION RISK SCREEN: I have an illness or condition that made me change the kind and/or amount of food I eat 0 No I eat fewer than two meals per day 0 No I eat few fruits and vegetables, or milk products 0 No I have three or more drinks of beer, liquor or wine almost every day 0 No I have tooth or mouth problems that make it hard for me to eat 0 No I don't always have enough money to buy the food I need 0 No I eat alone most of the time 0 No I take three or more different prescribed or over-the-counter drugs a day 1 Yes Without wanting to, I have lost or gained 10 pounds in the last six months 2 Yes I am not always physically able to shop, cook and/or feed myself 2 Yes Nutrition Protocols Good Risk Protocol Moderate Risk Protocol 0 Provide education on nutrition High Risk Proctocol Risk Level: Moderate Risk Score: 5 Electronic Signature(s) Signed: 01/25/2020 5:22:20 PM By: Carlene Coria RN Entered By: Carlene Coria on 01/25/2020 11:10:08

## 2020-01-27 DIAGNOSIS — L97312 Non-pressure chronic ulcer of right ankle with fat layer exposed: Secondary | ICD-10-CM | POA: Diagnosis not present

## 2020-01-27 DIAGNOSIS — I87311 Chronic venous hypertension (idiopathic) with ulcer of right lower extremity: Secondary | ICD-10-CM | POA: Diagnosis not present

## 2020-02-01 ENCOUNTER — Other Ambulatory Visit: Payer: Self-pay

## 2020-02-01 ENCOUNTER — Encounter (HOSPITAL_BASED_OUTPATIENT_CLINIC_OR_DEPARTMENT_OTHER): Payer: Medicare Other | Admitting: Physician Assistant

## 2020-02-01 DIAGNOSIS — L97312 Non-pressure chronic ulcer of right ankle with fat layer exposed: Secondary | ICD-10-CM | POA: Diagnosis not present

## 2020-02-01 DIAGNOSIS — I480 Paroxysmal atrial fibrillation: Secondary | ICD-10-CM | POA: Diagnosis not present

## 2020-02-01 DIAGNOSIS — Z87891 Personal history of nicotine dependence: Secondary | ICD-10-CM | POA: Diagnosis not present

## 2020-02-01 DIAGNOSIS — I5042 Chronic combined systolic (congestive) and diastolic (congestive) heart failure: Secondary | ICD-10-CM | POA: Diagnosis not present

## 2020-02-01 DIAGNOSIS — Z7901 Long term (current) use of anticoagulants: Secondary | ICD-10-CM | POA: Diagnosis not present

## 2020-02-01 DIAGNOSIS — I872 Venous insufficiency (chronic) (peripheral): Secondary | ICD-10-CM | POA: Diagnosis not present

## 2020-02-01 DIAGNOSIS — Z881 Allergy status to other antibiotic agents status: Secondary | ICD-10-CM | POA: Diagnosis not present

## 2020-02-01 DIAGNOSIS — Z8249 Family history of ischemic heart disease and other diseases of the circulatory system: Secondary | ICD-10-CM | POA: Diagnosis not present

## 2020-02-01 DIAGNOSIS — J449 Chronic obstructive pulmonary disease, unspecified: Secondary | ICD-10-CM | POA: Diagnosis not present

## 2020-02-01 DIAGNOSIS — E11622 Type 2 diabetes mellitus with other skin ulcer: Secondary | ICD-10-CM | POA: Diagnosis not present

## 2020-02-01 DIAGNOSIS — I110 Hypertensive heart disease with heart failure: Secondary | ICD-10-CM | POA: Diagnosis not present

## 2020-02-01 DIAGNOSIS — L97512 Non-pressure chronic ulcer of other part of right foot with fat layer exposed: Secondary | ICD-10-CM | POA: Diagnosis not present

## 2020-02-01 DIAGNOSIS — I890 Lymphedema, not elsewhere classified: Secondary | ICD-10-CM | POA: Diagnosis not present

## 2020-02-01 DIAGNOSIS — E11621 Type 2 diabetes mellitus with foot ulcer: Secondary | ICD-10-CM | POA: Diagnosis not present

## 2020-02-01 DIAGNOSIS — M1990 Unspecified osteoarthritis, unspecified site: Secondary | ICD-10-CM | POA: Diagnosis not present

## 2020-02-01 DIAGNOSIS — E1140 Type 2 diabetes mellitus with diabetic neuropathy, unspecified: Secondary | ICD-10-CM | POA: Diagnosis not present

## 2020-02-08 ENCOUNTER — Other Ambulatory Visit: Payer: Self-pay

## 2020-02-08 ENCOUNTER — Encounter (HOSPITAL_BASED_OUTPATIENT_CLINIC_OR_DEPARTMENT_OTHER): Payer: Medicare Other | Admitting: Physician Assistant

## 2020-02-08 DIAGNOSIS — L97312 Non-pressure chronic ulcer of right ankle with fat layer exposed: Secondary | ICD-10-CM | POA: Diagnosis not present

## 2020-02-08 DIAGNOSIS — M1990 Unspecified osteoarthritis, unspecified site: Secondary | ICD-10-CM | POA: Diagnosis not present

## 2020-02-08 DIAGNOSIS — I890 Lymphedema, not elsewhere classified: Secondary | ICD-10-CM | POA: Diagnosis not present

## 2020-02-08 DIAGNOSIS — L97512 Non-pressure chronic ulcer of other part of right foot with fat layer exposed: Secondary | ICD-10-CM | POA: Diagnosis not present

## 2020-02-08 DIAGNOSIS — Z7901 Long term (current) use of anticoagulants: Secondary | ICD-10-CM | POA: Diagnosis not present

## 2020-02-08 DIAGNOSIS — I5042 Chronic combined systolic (congestive) and diastolic (congestive) heart failure: Secondary | ICD-10-CM | POA: Diagnosis not present

## 2020-02-08 DIAGNOSIS — E1140 Type 2 diabetes mellitus with diabetic neuropathy, unspecified: Secondary | ICD-10-CM | POA: Diagnosis not present

## 2020-02-08 DIAGNOSIS — Z8249 Family history of ischemic heart disease and other diseases of the circulatory system: Secondary | ICD-10-CM | POA: Diagnosis not present

## 2020-02-08 DIAGNOSIS — I110 Hypertensive heart disease with heart failure: Secondary | ICD-10-CM | POA: Diagnosis not present

## 2020-02-08 DIAGNOSIS — Z881 Allergy status to other antibiotic agents status: Secondary | ICD-10-CM | POA: Diagnosis not present

## 2020-02-08 DIAGNOSIS — I480 Paroxysmal atrial fibrillation: Secondary | ICD-10-CM | POA: Diagnosis not present

## 2020-02-08 DIAGNOSIS — Z87891 Personal history of nicotine dependence: Secondary | ICD-10-CM | POA: Diagnosis not present

## 2020-02-08 DIAGNOSIS — J449 Chronic obstructive pulmonary disease, unspecified: Secondary | ICD-10-CM | POA: Diagnosis not present

## 2020-02-08 DIAGNOSIS — I872 Venous insufficiency (chronic) (peripheral): Secondary | ICD-10-CM | POA: Diagnosis not present

## 2020-02-08 DIAGNOSIS — E11622 Type 2 diabetes mellitus with other skin ulcer: Secondary | ICD-10-CM | POA: Diagnosis not present

## 2020-02-08 DIAGNOSIS — E11621 Type 2 diabetes mellitus with foot ulcer: Secondary | ICD-10-CM | POA: Diagnosis not present

## 2020-02-08 NOTE — Progress Notes (Addendum)
DARUIS, SWAIM ([id number]) Visit Report for 02/08/2020 Chief Complaint Document Details Patient Name: Date of Service: [place] S[place], [place] 02/08/2020 12:30 PM Medical Record Number: [id number] Patient Account Number: [id number] Date of Birth/Sex: Treating RN: March 05, 1946 (74 y.o. [name] Primary Care Provider: Cathlean Cower Other Clinician: Referring Provider: Treating Provider/Extender: Roque Cash in Treatment: 2 Information Obtained from: Patient Chief Complaint Right ankle ulcer Electronic Signature(s) Signed: 02/08/2020 1:03:08 PM By: Worthy Keeler PA-C Entered By: Worthy Keeler on 02/08/2020 13:03:08 -------------------------------------------------------------------------------- HPI Details Patient Name: Date of Service: Grandville Silos [place], [place] 02/08/2020 12:30 PM Medical Record Number: [id number] Patient Account Number: [id number] Date of Birth/Sex: Treating RN: [birth year] (74 y.o. [name] Primary Care Provider: Cathlean Cower Other Clinician: Referring Provider: Treating Provider/Extender: Roque Cash in Treatment: 2 History of Present Illness HPI Description: 10/19/2019 upon evaluation today patient appears to be doing somewhat poorly upon initial evaluation here in our clinic. He tells me that he has really an undisclosed amount of time that he has been dealing with issues with his legs bilaterally. The left leg swells minimally the right leg has wounds and unfortunately does seem to swell much more significantly. Fortunately there is no evidence of active infection at this time. No fevers, chills, nausea, vomiting, or diarrhea. With that being said the patient tells me that he does have a history of diabetes with his most recent hemoglobin A1c being 7.1 that was on 10/06/2019. He has been using Neosporin over the area. He also has what appears to be lymphedema stage III, chronic venous insufficiency,  diabetes mellitus type 2, hypertension, COPD, paroxysmal atrial fibrillation, and long-term use of anticoagulant therapy. The patient really does not know how long the wounds currently have been open although he tells me "months". 10/26/2019 on evaluation today patient appears to be doing excellent in regard to his right lower extremity. He just has a couple small areas remaining open at this point the majority of the wounds have all healed. Fortunately there is no signs of active infection. He does have a compression sock on the left lower extremity however the sock does have holes in it I think it is time for some new ones I think he would do well to get some from elastic therapy in [facility] as they will be much more inexpensive but at the same time excellent quality 11/02/2019 upon evaluation today patient appears to be doing very well with regard to his right lower extremity. Fortunately there is no evidence of active infection which is good news. Overall I think that he has done extremely well at this point. Readmission: 01/25/2020 upon evaluation today patient presents for reevaluation here in our clinic concerning initially what appeared to be a blister over his lower extremity that is the main reason he made the appointment. Unfortunately he ended up with a wound where his compression socks were bulging off right at the ankle area as well and that has been a bigger problem for him. He states that they really hurt him in that regard. He has never had Velcro compression socks although I think that may be something that would be beneficial for him. Fortunately there is no signs of active infection at this time. 02/08/2020 upon evaluation today patient appears to be doing well in general with regard to his wound. Overall I do believe that he is making good progress with this compression wrap currently and the collagen does  seem to be helping as well. Fortunately there is no evidence of  active infection at this time and I do see new skin growth at this point even compared to the last visit overall I feel like I would suggest continuing with the collagen at this time. Electronic Signature(s) Signed: 02/08/2020 1:10:15 PM By: Worthy Keeler PA-C Entered By: Worthy Keeler on 02/08/2020 13:10:15 -------------------------------------------------------------------------------- Physical Exam Details Patient Name: Date of Service: Grandville Silos [place], [place] 02/08/2020 12:30 PM Medical Record Number: [id number] Patient Account Number: [id number] Date of Birth/Sex: Treating RN: Aug 04, 1945 (74 y.o. [name] Primary Care Provider: Cathlean Cower Other Clinician: Referring Provider: Treating Provider/Extender: Roque Cash in Treatment: 2 Constitutional Well-nourished and well-hydrated in no acute distress. Respiratory normal breathing without difficulty. Psychiatric this patient is able to make decisions and demonstrates good insight into disease process. Alert and Oriented x 3. pleasant and cooperative. Notes Patient's wound bed currently showed good granulation and epithelization there is no evidence of infection overall I feel like that he is actually making good progress. He does have his juxta light as well that will be available once he is healed to transition into at that point. Electronic Signature(s) Signed: 02/08/2020 1:10:32 PM By: Worthy Keeler PA-C Entered By: Worthy Keeler on 02/08/2020 13:10:31 -------------------------------------------------------------------------------- Physician Orders Details Patient Name: Date of Service: Grandville Silos [place], [place] 02/08/2020 12:30 PM Medical Record Number: [id number] Patient Account Number: [id number] Date of Birth/Sex: Treating RN: [birth year] (74 y.o. [name] Primary Care Provider: Cathlean Cower Other Clinician: Referring Provider: Treating Provider/Extender: Roque Cash in Treatment: 2 Verbal / Phone Orders: No Diagnosis Coding ICD-10 Coding Code Description I87.2 Venous insufficiency (chronic) (peripheral) I89.0 Lymphedema, not elsewhere classified L97.312 Non-pressure chronic ulcer of right ankle with fat layer exposed E11.622 Type 2 diabetes mellitus with other skin ulcer I50.42 Chronic combined systolic (congestive) and diastolic (congestive) heart failure J44.9 Chronic obstructive pulmonary disease, unspecified I48.0 Paroxysmal atrial fibrillation Z79.01 Long term (current) use of anticoagulants Follow-up Appointments Return Appointment in 1 week. Dressing Change Frequency Wound #5 Right,Anterior Ankle Do not change entire dressing for one week. Skin Barriers/Peri-Wound Care Moisturizing lotion Wound Cleansing Wound #5 Right,Anterior Ankle May shower with protection. Primary Wound Dressing Wound #5 Right,Anterior Ankle Silver Collagen Secondary Dressing Wound #5 Right,Anterior Ankle Foam Dry Gauze Edema Control 4 layer compression - Right Lower Extremity Avoid standing for long periods of time Elevate legs to the level of the heart or above for 30 minutes daily and/or when sitting, a frequency of: Exercise regularly Electronic Signature(s) Signed: 02/08/2020 6:54:26 PM By: Worthy Keeler PA-C Signed: 02/09/2020 4:56:58 PM By: Baruch Gouty RN, BSN Entered By: Baruch Gouty on 02/08/2020 13:07:34 -------------------------------------------------------------------------------- Problem List Details Patient Name: Date of Service: Grandville Silos [place], [place] 02/08/2020 12:30 PM Medical Record Number: [id number] Patient Account Number: [id number] Date of Birth/Sex: Treating RN: [birth year] (74 y.o. [name] Primary Care Provider: Cathlean Cower Other Clinician: Referring Provider: Treating Provider/Extender: Roque Cash in Treatment: 2 Active Problems ICD-10 Encounter Code Description  Active Date MDM Diagnosis I87.2 Venous insufficiency (chronic) (peripheral) 01/25/2020 No Yes I89.0 Lymphedema, not elsewhere classified 01/25/2020 No Yes L97.312 Non-pressure chronic ulcer of right ankle with fat layer exposed 01/25/2020 No Yes E11.622 Type 2 diabetes mellitus with other skin ulcer 01/25/2020 No Yes I50.42 Chronic combined systolic (congestive) and diastolic (congestive) heart failure 01/25/2020 No Yes J44.9 Chronic obstructive pulmonary disease, unspecified  01/25/2020 No Yes I48.0 Paroxysmal atrial fibrillation 01/25/2020 No Yes Z79.01 Long term (current) use of anticoagulants 01/25/2020 No Yes Inactive Problems Resolved Problems Electronic Signature(s) Signed: 02/08/2020 1:03:01 PM By: Worthy Keeler PA-C Entered By: Worthy Keeler on 02/08/2020 13:03:00 -------------------------------------------------------------------------------- Progress Note Details Patient Name: Date of Service: Grandville Silos [place], [place] 02/08/2020 12:30 PM Medical Record Number: [id number] Patient Account Number: [id number] Date of Birth/Sex: Treating RN: 1946/03/30 (74 y.o. [name] Primary Care Provider: Cathlean Cower Other Clinician: Referring Provider: Treating Provider/Extender: Roque Cash in Treatment: 2 Subjective Chief Complaint Information obtained from Patient Right ankle ulcer History of Present Illness (HPI) 10/19/2019 upon evaluation today patient appears to be doing somewhat poorly upon initial evaluation here in our clinic. He tells me that he has really an undisclosed amount of time that he has been dealing with issues with his legs bilaterally. The left leg swells minimally the right leg has wounds and unfortunately does seem to swell much more significantly. Fortunately there is no evidence of active infection at this time. No fevers, chills, nausea, vomiting, or diarrhea. With that being said the patient tells me that he does have a history of diabetes with  his most recent hemoglobin A1c being 7.1 that was on 10/06/2019. He has been using Neosporin over the area. He also has what appears to be lymphedema stage III, chronic venous insufficiency, diabetes mellitus type 2, hypertension, COPD, paroxysmal atrial fibrillation, and long-term use of anticoagulant therapy. The patient really does not know how long the wounds currently have been open although he tells me "months". 10/26/2019 on evaluation today patient appears to be doing excellent in regard to his right lower extremity. He just has a couple small areas remaining open at this point the majority of the wounds have all healed. Fortunately there is no signs of active infection. He does have a compression sock on the left lower extremity however the sock does have holes in it I think it is time for some new ones I think he would do well to get some from elastic therapy in [facility] as they will be much more inexpensive but at the same time excellent quality 11/02/2019 upon evaluation today patient appears to be doing very well with regard to his right lower extremity. Fortunately there is no evidence of active infection which is good news. Overall I think that he has done extremely well at this point. Readmission: 01/25/2020 upon evaluation today patient presents for reevaluation here in our clinic concerning initially what appeared to be a blister over his lower extremity that is the main reason he made the appointment. Unfortunately he ended up with a wound where his compression socks were bulging off right at the ankle area as well and that has been a bigger problem for him. He states that they really hurt him in that regard. He has never had Velcro compression socks although I think that may be something that would be beneficial for him. Fortunately there is no signs of active infection at this time. 02/08/2020 upon evaluation today patient appears to be doing well in general with regard to  his wound. Overall I do believe that he is making good progress with this compression wrap currently and the collagen does seem to be helping as well. Fortunately there is no evidence of active infection at this time and I do see new skin growth at this point even compared to the last visit overall I feel like  I would suggest continuing with the collagen at this time. Objective Constitutional Well-nourished and well-hydrated in no acute distress. Vitals Time Taken: 12:49 PM, Height: 73 in, Weight: 275 lbs, BMI: 36.3, Temperature: 98.0 F, Pulse: 55 bpm, Respiratory Rate: 18 breaths/min, Blood Pressure: 108/63 mmHg. Respiratory normal breathing without difficulty. Psychiatric this patient is able to make decisions and demonstrates good insight into disease process. Alert and Oriented x 3. pleasant and cooperative. General Notes: Patient's wound bed currently showed good granulation and epithelization there is no evidence of infection overall I feel like that he is actually making good progress. He does have his juxta light as well that will be available once he is healed to transition into at that point. Integumentary (Hair, Skin) Wound #5 status is Open. Original cause of wound was Gradually Appeared. The wound is located on the Right,Anterior Ankle. The wound measures 0.7cm length x 1.4cm width x 0.1cm depth; 0.77cm^2 area and 0.077cm^3 volume. There is Fat Layer (Subcutaneous Tissue) Exposed exposed. There is no tunneling or undermining noted. There is a small amount of serosanguineous drainage noted. The wound margin is flat and intact. There is medium (34-66%) pink granulation within the wound bed. There is a medium (34-66%) amount of necrotic tissue within the wound bed including Adherent Slough. Assessment Active Problems ICD-10 Venous insufficiency (chronic) (peripheral) Lymphedema, not elsewhere classified Non-pressure chronic ulcer of right ankle with fat layer exposed Type 2  diabetes mellitus with other skin ulcer Chronic combined systolic (congestive) and diastolic (congestive) heart failure Chronic obstructive pulmonary disease, unspecified Paroxysmal atrial fibrillation Long term (current) use of anticoagulants Procedures Wound #5 Pre-procedure diagnosis of Wound #5 is a Diabetic Wound/Ulcer of the Lower Extremity located on the Right,Anterior Ankle . There was a Four Layer Compression Therapy Procedure by Carlene Coria, RN. Post procedure Diagnosis Wound #5: Same as Pre-Procedure Plan Follow-up Appointments: Return Appointment in 1 week. Dressing Change Frequency: Wound #5 Right,Anterior Ankle: Do not change entire dressing for one week. Skin Barriers/Peri-Wound Care: Moisturizing lotion Wound Cleansing: Wound #5 Right,Anterior Ankle: May shower with protection. Primary Wound Dressing: Wound #5 Right,Anterior Ankle: Silver Collagen Secondary Dressing: Wound #5 Right,Anterior Ankle: Foam Dry Gauze Edema Control: 4 layer compression - Right Lower Extremity Avoid standing for long periods of time Elevate legs to the level of the heart or above for 30 minutes daily and/or when sitting, a frequency of: Exercise regularly 1. I would recommend currently that we go ahead and have the patient continue with the collagen dressing along with a compression wrap he seems to be doing well with that. Obviously he does not like the wrap but I do believe the 4-layer compression wrap has been beneficial for him. 2. I am also can recommend that we continue to have him elevate his legs much as possible try to keep edema under control obviously I think this is of utmost importance. We will see patient back for reevaluation in 1 week here in the clinic. If anything worsens or changes patient will contact our office for additional recommendations. Electronic Signature(s) Signed: 02/08/2020 1:11:48 PM By: Worthy Keeler PA-C Entered By: Worthy Keeler on 02/08/2020  13:11:48 -------------------------------------------------------------------------------- SuperBill Details Patient Name: Date of Service: Grandville Silos [place], [place] 02/08/2020 Medical Record Number: [id number] Patient Account Number: [id number] Date of Birth/Sex: Treating RN: [birth year] (74 y.o. [name] Primary Care Provider: Cathlean Cower Other Clinician: Referring Provider: Treating Provider/Extender: Roque Cash in Treatment: 2 Diagnosis Coding ICD-10 Codes Code Description [id number]  Venous insufficiency (chronic) (peripheral) I89.0 Lymphedema, not elsewhere classified L97.312 Non-pressure chronic ulcer of right ankle with fat layer exposed E11.622 Type 2 diabetes mellitus with other skin ulcer I50.42 Chronic combined systolic (congestive) and diastolic (congestive) heart failure J44.9 Chronic obstructive pulmonary disease, unspecified I48.0 Paroxysmal atrial fibrillation Z79.01 Long term (current) use of anticoagulants Facility Procedures CPT4 Code: [id number] Description: (Facility Use Only) [phone] - APPLY MULTLAY COMPRS LWR RT LEG Modifier: Quantity: 1 Physician Procedures : CPT4 Code Description Modifier [id number] - WC PHYS LEVEL 3 - EST PT ICD-10 Diagnosis Description I87.2 Venous insufficiency (chronic) (peripheral) I89.0 Lymphedema, not elsewhere classified L97.312 Non-pressure chronic ulcer of right ankle with fat  layer exposed E11.622 Type 2 diabetes mellitus with other skin ulcer Quantity: 1 Electronic Signature(s) Signed: 02/08/2020 1:12:00 PM By: Worthy Keeler PA-C Entered By: Worthy Keeler on 02/08/2020 13:12:00

## 2020-02-10 NOTE — Progress Notes (Signed)
VASILIOS, [place] ([id number]) Visit Report for 02/08/2020 Arrival Information Details Patient Name: Date of Service: [place] S[place], [place] 02/08/2020 12:30 PM Medical Record Number: [id number] Patient Account Number: [id number] Date of Birth/Sex: Treating RN: February 27, 1946 (74 y.o. [name] Primary Care Jacody Beneke: Cathlean Cower Other Clinician: Referring Demetreus Lothamer: Treating Jonesha Tsuchiya/Extender: Roque Cash in Treatment: 2 Visit Information History Since Last Visit Added or deleted any medications: No Patient Arrived: Wheel Chair Any new allergies or adverse reactions: No Arrival Time: 12:47 Had a fall or experienced change in No Accompanied By: alone activities of daily living that may affect Transfer Assistance: None risk of falls: Patient Identification Verified: Yes Signs or symptoms of abuse/neglect since last visito No Secondary Verification Process Completed: Yes Hospitalized since last visit: No Patient Requires Transmission-Based Precautions: No Implantable device outside of the clinic excluding No Patient Has Alerts: Yes cellular tissue based products placed in the center Patient Alerts: Patient on Blood Thinner since last visit: ABI 1.07 right Has Dressing in Place as Prescribed: Yes Pain Present Now: No Electronic Signature(s) Signed: 02/09/2020 5:02:14 PM By: Levan Hurst RN, BSN Entered By: Levan Hurst on 02/08/2020 12:47:54 -------------------------------------------------------------------------------- Compression Therapy Details Patient Name: Date of Service: Grandville Silos [place], [place] 02/08/2020 12:30 PM Medical Record Number: [id number] Patient Account Number: [id number] Date of Birth/Sex: Treating RN: Jan 09, 1946 (74 y.o. [name] Primary Care Aairah Negrette: Cathlean Cower Other Clinician: Referring Damilola Flamm: Treating Deadrick Stidd/Extender: Roque Cash in Treatment: 2 Compression Therapy Performed for Wound  Assessment: Wound #5 Right,Anterior Ankle Performed By: Clinician Carlene Coria, RN Compression Type: Four Layer Post Procedure Diagnosis Same as Pre-procedure Electronic Signature(s) Signed: 02/09/2020 4:56:58 PM By: Baruch Gouty RN, BSN Entered By: Baruch Gouty on 02/08/2020 13:06:11 -------------------------------------------------------------------------------- Encounter Discharge Information Details Patient Name: Date of Service: Grandville Silos [place], [place] 02/08/2020 12:30 PM Medical Record Number: [id number] Patient Account Number: [id number] Date of Birth/Sex: Treating RN: 17-Jul-1946 (74 y.o. [name] Primary Care Lerin Jech: Cathlean Cower Other Clinician: Referring Diavion Labrador: Treating Summit Arroyave/Extender: Roque Cash in Treatment: 2 Encounter Discharge Information Items Discharge Condition: Stable Ambulatory Status: Wheelchair Discharge Destination: Home Transportation: Private Auto Accompanied By: self Schedule Follow-up Appointment: Yes Clinical Summary of Care: Patient Declined Electronic Signature(s) Signed: 02/10/2020 5:42:58 PM By: Carlene Coria RN Entered By: Carlene Coria on 02/08/2020 13:35:56 -------------------------------------------------------------------------------- Lower Extremity Assessment Details Patient Name: Date of Service: Grandville Silos [place], [place] 02/08/2020 12:30 PM Medical Record Number: [id number] Patient Account Number: [id number] Date of Birth/Sex: Treating RN: 1945-11-17 (74 y.o. [name] Primary Care Dashanna Kinnamon: Cathlean Cower Other Clinician: Referring Lacorey Brusca: Treating Jerolene Kupfer/Extender: Roque Cash in Treatment: 2 Edema Assessment Assessed: Shirlyn Goltz: No] Patrice Paradise: No] Edema: [Left: Ye] [Right: s] Calf Left: Right: Point of Measurement: 47 cm From Medial Instep cm 39 cm Ankle Left: Right: Point of Measurement: 10 cm From Medial Instep cm 30 cm Vascular Assessment Pulses: Dorsalis  Pedis Palpable: [Right:Yes] Electronic Signature(s) Signed: 02/09/2020 5:02:14 PM By: Levan Hurst RN, BSN Entered By: Levan Hurst on 02/08/2020 12:53:24 -------------------------------------------------------------------------------- [place] Details Patient Name: Date of Service: Grandville Silos [place], [place] 02/08/2020 12:30 PM Medical Record Number: [id number] Patient Account Number: [id number] Date of Birth/Sex: Treating RN: Apr 03, 1946 (74 y.o. [name] Primary Care Kajsa Butrum: Cathlean Cower Other Clinician: Referring Goerge Mohr: Treating Shamekia Tippets/Extender: Roque Cash in Treatment: 2 Active Inactive Venous Leg Ulcer Nursing Diagnoses: Knowledge deficit related to disease process and management  Potential for venous Insuffiency (use before diagnosis confirmed) Goals: Patient will maintain optimal edema control Date Initiated: 01/25/2020 Target Resolution Date: 02/22/2020 Goal Status: Active Interventions: Assess peripheral edema status every visit. Compression as ordered Treatment Activities: Therapeutic compression applied : 01/25/2020 Notes: Wound/Skin Impairment Nursing Diagnoses: Impaired tissue integrity Knowledge deficit related to ulceration/compromised skin integrity Goals: Patient/caregiver will verbalize understanding of skin care regimen Date Initiated: 01/25/2020 Target Resolution Date: 02/22/2020 Goal Status: Active Ulcer/skin breakdown will have a volume reduction of 30% by week 4 Date Initiated: 01/25/2020 Target Resolution Date: 02/22/2020 Goal Status: Active Interventions: Assess patient/caregiver ability to obtain necessary supplies Assess patient/caregiver ability to perform ulcer/skin care regimen upon admission and as needed Assess ulceration(s) every visit Provide education on ulcer and skin care Treatment Activities: Skin care regimen initiated : 01/25/2020 Topical wound management initiated :  01/25/2020 Notes: Electronic Signature(s) Signed: 02/09/2020 4:56:58 PM By: Baruch Gouty RN, BSN Entered By: Baruch Gouty on 02/08/2020 12:58:11 -------------------------------------------------------------------------------- Pain Assessment Details Patient Name: Date of Service: Grandville Silos [place], [place] 02/08/2020 12:30 PM Medical Record Number: [id number] Patient Account Number: [id number] Date of Birth/Sex: Treating RN: Feb 07, 1946 (74 y.o. [name] Primary Care Jariah Jarmon: Cathlean Cower Other Clinician: Referring Hulbert Branscome: Treating Dimarco Minkin/Extender: Roque Cash in Treatment: 2 Active Problems Location of Pain Severity and Description of Pain Patient Has Paino No Site Locations Pain Management and Medication Current Pain Management: Electronic Signature(s) Signed: 02/09/2020 5:02:14 PM By: Levan Hurst RN, BSN Entered By: Levan Hurst on 02/08/2020 12:49:47 -------------------------------------------------------------------------------- Patient/Caregiver Education Details Patient Name: Date of Service: Grandville Silos [place], [place] 7/21/2021andnbsp12:30 PM Medical Record Number: [id number] Patient Account Number: [id number] Date of Birth/Gender: Treating RN: 15-Feb-1946 (74 y.o. [name] Primary Care Physician: Cathlean Cower Other Clinician: Referring Physician: Treating Physician/Extender: Roque Cash in Treatment: 2 Education Assessment Education Provided To: Patient Education Topics Provided Venous: Methods: Explain/Verbal Responses: Reinforcements needed, State content correctly Wound/Skin Impairment: Methods: Explain/Verbal Responses: Reinforcements needed, State content correctly Electronic Signature(s) Signed: 02/09/2020 4:56:58 PM By: Baruch Gouty RN, BSN Entered By: Baruch Gouty on 02/08/2020 12:58:32 -------------------------------------------------------------------------------- Wound  Assessment Details Patient Name: Date of Service: Grandville Silos [place], [place] 02/08/2020 12:30 PM Medical Record Number: [id number] Patient Account Number: [id number] Date of Birth/Sex: Treating RN: [birth year] (74 y.o. [name] Primary Care Malajah Oceguera: Cathlean Cower Other Clinician: Referring Railey Glad: Treating Averee Harb/Extender: Roque Cash in Treatment: 2 Wound Status Wound Number: 5 Primary Diabetic Wound/Ulcer of the Lower Extremity Etiology: Wound Location: Right, Anterior Ankle Secondary Lymphedema Wounding Event: Gradually Appeared Etiology: Date Acquired: 12/20/2019 Wound Open Weeks Of Treatment: 2 Status: Clustered Wound: No Comorbid Chronic Obstructive Pulmonary Disease (COPD), Arrhythmia, History: Hypertension, Type II Diabetes, Osteoarthritis, Neuropathy Photos Photo Uploaded By: Mikeal Hawthorne on 02/09/2020 15:28:50 Wound Measurements Length: (cm) 0.7 % Re Width: (cm) 1.4 % Re Depth: (cm) 0.1 Epit Area: (cm) 0.77 Tun Volume: (cm) 0.077 Und duction in Area: -30.7% duction in Volume: -30.5% helialization: Small (1-33%) neling: No ermining: No Wound Description Classification: Grade 2 Foul Wound Margin: Flat and Intact Slou Exudate Amount: Small Exudate Type: Serosanguineous Exudate Color: red, brown Odor After Cleansing: No gh/Fibrino Yes Wound Bed Granulation Amount: Medium (34-66%) Exposed Structure Granulation Quality: Pink Fascia Exposed: No Necrotic Amount: Medium (34-66%) Fat Layer (Subcutaneous Tissue) Exposed: Yes Necrotic Quality: Adherent Slough Tendon Exposed: No Muscle Exposed: No Joint Exposed: No Bone Exposed: No Treatment Notes Wound #5 (Right, Anterior Ankle) 1. Cleanse With Wound Cleanser Soap  and water 3. Primary Dressing Applied Collegen AG 4. Secondary Dressing Dry Gauze 6. Support Layer Applied 4 layer compression Water quality scientist) Signed: 02/09/2020 5:02:14 PM By: Levan Hurst RN,  BSN Entered By: Levan Hurst on 02/08/2020 12:57:37 -------------------------------------------------------------------------------- [place] Details Patient Name: Date of Service: Grandville Silos [place], [place] 02/08/2020 12:30 PM Medical Record Number: [id number] Patient Account Number: [id number] Date of Birth/Sex: Treating RN: September 05, 1945 (74 y.o. [name] Primary Care Saloma Cadena: Cathlean Cower Other Clinician: Referring Traver Meckes: Treating Zavien Clubb/Extender: Roque Cash in Treatment: 2 Vital Signs Time Taken: 12:49 Temperature (F): 98.0 Height (in): 73 Pulse (bpm): 55 Weight (lbs): 275 Respiratory Rate (breaths/min): 18 Body Mass Index (BMI): 36.3 Blood Pressure (mmHg): 108/63 Reference Range: 80 - 120 mg / dl Electronic Signature(s) Signed: 02/09/2020 5:02:14 PM By: Levan Hurst RN, BSN Entered By: Levan Hurst on 02/08/2020 12:49:41

## 2020-02-15 ENCOUNTER — Emergency Department (HOSPITAL_COMMUNITY): Payer: Medicare Other

## 2020-02-15 ENCOUNTER — Inpatient Hospital Stay (HOSPITAL_COMMUNITY): Payer: Medicare Other

## 2020-02-15 ENCOUNTER — Encounter (HOSPITAL_BASED_OUTPATIENT_CLINIC_OR_DEPARTMENT_OTHER): Payer: Medicare Other | Admitting: Physician Assistant

## 2020-02-15 ENCOUNTER — Inpatient Hospital Stay (HOSPITAL_COMMUNITY)
Admission: EM | Admit: 2020-02-15 | Discharge: 2020-02-23 | DRG: 682 | Disposition: A | Discharge status: Home / Self Care | Payer: Medicare Other | Attending: Internal Medicine | Admitting: Internal Medicine

## 2020-02-15 DIAGNOSIS — N179 Acute kidney failure, unspecified: Secondary | ICD-10-CM | POA: Diagnosis not present

## 2020-02-15 DIAGNOSIS — I482 Chronic atrial fibrillation, unspecified: Secondary | ICD-10-CM | POA: Diagnosis not present

## 2020-02-15 DIAGNOSIS — I130 Hypertensive heart and chronic kidney disease with heart failure and stage 1 through stage 4 chronic kidney disease, or unspecified chronic kidney disease: Secondary | ICD-10-CM | POA: Diagnosis present

## 2020-02-15 DIAGNOSIS — I5032 Chronic diastolic (congestive) heart failure: Secondary | ICD-10-CM | POA: Diagnosis not present

## 2020-02-15 DIAGNOSIS — Z87891 Personal history of nicotine dependence: Secondary | ICD-10-CM

## 2020-02-15 DIAGNOSIS — E162 Hypoglycemia, unspecified: Secondary | ICD-10-CM | POA: Diagnosis not present

## 2020-02-15 DIAGNOSIS — R630 Anorexia: Secondary | ICD-10-CM | POA: Diagnosis not present

## 2020-02-15 DIAGNOSIS — E161 Other hypoglycemia: Secondary | ICD-10-CM | POA: Diagnosis not present

## 2020-02-15 DIAGNOSIS — J449 Chronic obstructive pulmonary disease, unspecified: Secondary | ICD-10-CM | POA: Diagnosis present

## 2020-02-15 DIAGNOSIS — D696 Thrombocytopenia, unspecified: Secondary | ICD-10-CM | POA: Diagnosis not present

## 2020-02-15 DIAGNOSIS — E861 Hypovolemia: Secondary | ICD-10-CM | POA: Diagnosis present

## 2020-02-15 DIAGNOSIS — Z7984 Long term (current) use of oral hypoglycemic drugs: Secondary | ICD-10-CM

## 2020-02-15 DIAGNOSIS — Z452 Encounter for adjustment and management of vascular access device: Secondary | ICD-10-CM

## 2020-02-15 DIAGNOSIS — Z6835 Body mass index (BMI) 35.0-35.9, adult: Secondary | ICD-10-CM

## 2020-02-15 DIAGNOSIS — E785 Hyperlipidemia, unspecified: Secondary | ICD-10-CM | POA: Diagnosis not present

## 2020-02-15 DIAGNOSIS — Z888 Allergy status to other drugs, medicaments and biological substances status: Secondary | ICD-10-CM

## 2020-02-15 DIAGNOSIS — E872 Acidosis: Secondary | ICD-10-CM | POA: Diagnosis not present

## 2020-02-15 DIAGNOSIS — M47817 Spondylosis without myelopathy or radiculopathy, lumbosacral region: Secondary | ICD-10-CM | POA: Diagnosis present

## 2020-02-15 DIAGNOSIS — Z992 Dependence on renal dialysis: Secondary | ICD-10-CM | POA: Diagnosis not present

## 2020-02-15 DIAGNOSIS — I4891 Unspecified atrial fibrillation: Secondary | ICD-10-CM | POA: Diagnosis not present

## 2020-02-15 DIAGNOSIS — N189 Chronic kidney disease, unspecified: Secondary | ICD-10-CM | POA: Diagnosis not present

## 2020-02-15 DIAGNOSIS — I4819 Other persistent atrial fibrillation: Secondary | ICD-10-CM | POA: Diagnosis not present

## 2020-02-15 DIAGNOSIS — Z515 Encounter for palliative care: Secondary | ICD-10-CM | POA: Diagnosis present

## 2020-02-15 DIAGNOSIS — I4729 Other ventricular tachycardia: Secondary | ICD-10-CM

## 2020-02-15 DIAGNOSIS — G8929 Other chronic pain: Secondary | ICD-10-CM | POA: Diagnosis present

## 2020-02-15 DIAGNOSIS — Z8719 Personal history of other diseases of the digestive system: Secondary | ICD-10-CM

## 2020-02-15 DIAGNOSIS — Z8546 Personal history of malignant neoplasm of prostate: Secondary | ICD-10-CM

## 2020-02-15 DIAGNOSIS — L97218 Non-pressure chronic ulcer of right calf with other specified severity: Secondary | ICD-10-CM | POA: Diagnosis present

## 2020-02-15 DIAGNOSIS — E875 Hyperkalemia: Secondary | ICD-10-CM | POA: Diagnosis present

## 2020-02-15 DIAGNOSIS — Z9981 Dependence on supplemental oxygen: Secondary | ICD-10-CM

## 2020-02-15 DIAGNOSIS — L8990 Pressure ulcer of unspecified site, unspecified stage: Secondary | ICD-10-CM | POA: Insufficient documentation

## 2020-02-15 DIAGNOSIS — J9601 Acute respiratory failure with hypoxia: Secondary | ICD-10-CM | POA: Diagnosis not present

## 2020-02-15 DIAGNOSIS — F1021 Alcohol dependence, in remission: Secondary | ICD-10-CM | POA: Diagnosis present

## 2020-02-15 DIAGNOSIS — R0902 Hypoxemia: Secondary | ICD-10-CM | POA: Diagnosis not present

## 2020-02-15 DIAGNOSIS — E1165 Type 2 diabetes mellitus with hyperglycemia: Secondary | ICD-10-CM | POA: Diagnosis present

## 2020-02-15 DIAGNOSIS — I480 Paroxysmal atrial fibrillation: Secondary | ICD-10-CM | POA: Diagnosis not present

## 2020-02-15 DIAGNOSIS — E669 Obesity, unspecified: Secondary | ICD-10-CM | POA: Diagnosis present

## 2020-02-15 DIAGNOSIS — Z20822 Contact with and (suspected) exposure to covid-19: Secondary | ICD-10-CM | POA: Diagnosis present

## 2020-02-15 DIAGNOSIS — R779 Abnormality of plasma protein, unspecified: Secondary | ICD-10-CM | POA: Diagnosis not present

## 2020-02-15 DIAGNOSIS — D631 Anemia in chronic kidney disease: Secondary | ICD-10-CM | POA: Diagnosis not present

## 2020-02-15 DIAGNOSIS — N1832 Chronic kidney disease, stage 3b: Secondary | ICD-10-CM | POA: Diagnosis not present

## 2020-02-15 DIAGNOSIS — J9811 Atelectasis: Secondary | ICD-10-CM | POA: Diagnosis not present

## 2020-02-15 DIAGNOSIS — R531 Weakness: Secondary | ICD-10-CM | POA: Insufficient documentation

## 2020-02-15 DIAGNOSIS — R8271 Bacteriuria: Secondary | ICD-10-CM | POA: Diagnosis not present

## 2020-02-15 DIAGNOSIS — Z8042 Family history of malignant neoplasm of prostate: Secondary | ICD-10-CM

## 2020-02-15 DIAGNOSIS — E11622 Type 2 diabetes mellitus with other skin ulcer: Secondary | ICD-10-CM | POA: Diagnosis not present

## 2020-02-15 DIAGNOSIS — J9690 Respiratory failure, unspecified, unspecified whether with hypoxia or hypercapnia: Secondary | ICD-10-CM | POA: Diagnosis not present

## 2020-02-15 DIAGNOSIS — I472 Ventricular tachycardia: Secondary | ICD-10-CM | POA: Diagnosis not present

## 2020-02-15 DIAGNOSIS — C9000 Multiple myeloma not having achieved remission: Secondary | ICD-10-CM | POA: Diagnosis present

## 2020-02-15 DIAGNOSIS — G9341 Metabolic encephalopathy: Secondary | ICD-10-CM | POA: Diagnosis present

## 2020-02-15 DIAGNOSIS — Z8673 Personal history of transient ischemic attack (TIA), and cerebral infarction without residual deficits: Secondary | ICD-10-CM

## 2020-02-15 DIAGNOSIS — L89151 Pressure ulcer of sacral region, stage 1: Secondary | ICD-10-CM | POA: Diagnosis not present

## 2020-02-15 DIAGNOSIS — Z9079 Acquired absence of other genital organ(s): Secondary | ICD-10-CM

## 2020-02-15 DIAGNOSIS — E1122 Type 2 diabetes mellitus with diabetic chronic kidney disease: Secondary | ICD-10-CM | POA: Diagnosis not present

## 2020-02-15 DIAGNOSIS — N400 Enlarged prostate without lower urinary tract symptoms: Secondary | ICD-10-CM | POA: Diagnosis present

## 2020-02-15 DIAGNOSIS — I471 Supraventricular tachycardia: Secondary | ICD-10-CM | POA: Diagnosis not present

## 2020-02-15 DIAGNOSIS — N281 Cyst of kidney, acquired: Secondary | ICD-10-CM | POA: Diagnosis present

## 2020-02-15 DIAGNOSIS — R4781 Slurred speech: Secondary | ICD-10-CM | POA: Diagnosis not present

## 2020-02-15 DIAGNOSIS — R319 Hematuria, unspecified: Secondary | ICD-10-CM | POA: Diagnosis not present

## 2020-02-15 DIAGNOSIS — L89312 Pressure ulcer of right buttock, stage 2: Secondary | ICD-10-CM | POA: Diagnosis present

## 2020-02-15 DIAGNOSIS — L97312 Non-pressure chronic ulcer of right ankle with fat layer exposed: Secondary | ICD-10-CM | POA: Diagnosis not present

## 2020-02-15 DIAGNOSIS — I129 Hypertensive chronic kidney disease with stage 1 through stage 4 chronic kidney disease, or unspecified chronic kidney disease: Secondary | ICD-10-CM | POA: Diagnosis not present

## 2020-02-15 DIAGNOSIS — R9431 Abnormal electrocardiogram [ECG] [EKG]: Secondary | ICD-10-CM | POA: Diagnosis not present

## 2020-02-15 DIAGNOSIS — Z6831 Body mass index (BMI) 31.0-31.9, adult: Secondary | ICD-10-CM

## 2020-02-15 DIAGNOSIS — R0689 Other abnormalities of breathing: Secondary | ICD-10-CM | POA: Diagnosis not present

## 2020-02-15 DIAGNOSIS — I890 Lymphedema, not elsewhere classified: Secondary | ICD-10-CM | POA: Diagnosis not present

## 2020-02-15 DIAGNOSIS — N529 Male erectile dysfunction, unspecified: Secondary | ICD-10-CM | POA: Diagnosis present

## 2020-02-15 DIAGNOSIS — M160 Bilateral primary osteoarthritis of hip: Secondary | ICD-10-CM | POA: Diagnosis present

## 2020-02-15 DIAGNOSIS — E1142 Type 2 diabetes mellitus with diabetic polyneuropathy: Secondary | ICD-10-CM | POA: Diagnosis not present

## 2020-02-15 DIAGNOSIS — Z87442 Personal history of urinary calculi: Secondary | ICD-10-CM

## 2020-02-15 DIAGNOSIS — Z833 Family history of diabetes mellitus: Secondary | ICD-10-CM

## 2020-02-15 DIAGNOSIS — Z743 Need for continuous supervision: Secondary | ICD-10-CM | POA: Diagnosis not present

## 2020-02-15 DIAGNOSIS — Z8249 Family history of ischemic heart disease and other diseases of the circulatory system: Secondary | ICD-10-CM

## 2020-02-15 DIAGNOSIS — Z79899 Other long term (current) drug therapy: Secondary | ICD-10-CM

## 2020-02-15 DIAGNOSIS — Z7901 Long term (current) use of anticoagulants: Secondary | ICD-10-CM

## 2020-02-15 DIAGNOSIS — R066 Hiccough: Secondary | ICD-10-CM | POA: Diagnosis present

## 2020-02-15 DIAGNOSIS — L89152 Pressure ulcer of sacral region, stage 2: Secondary | ICD-10-CM | POA: Diagnosis present

## 2020-02-15 DIAGNOSIS — I872 Venous insufficiency (chronic) (peripheral): Secondary | ICD-10-CM | POA: Diagnosis not present

## 2020-02-15 DIAGNOSIS — D649 Anemia, unspecified: Secondary | ICD-10-CM | POA: Diagnosis not present

## 2020-02-15 LAB — COMPREHENSIVE METABOLIC PANEL
ALT: 9 U/L (ref 0–44)
AST: 13 U/L — ABNORMAL LOW (ref 15–41)
Albumin: 3.3 g/dL — ABNORMAL LOW (ref 3.5–5.0)
Alkaline Phosphatase: 64 U/L (ref 38–126)
Anion gap: 13 (ref 5–15)
BUN: 168 mg/dL — ABNORMAL HIGH (ref 8–23)
CO2: 9 mmol/L — ABNORMAL LOW (ref 22–32)
Calcium: 9.2 mg/dL (ref 8.9–10.3)
Chloride: 111 mmol/L (ref 98–111)
Creatinine, Ser: 11.66 mg/dL — ABNORMAL HIGH (ref 0.61–1.24)
GFR calc Af Amer: 4 mL/min — ABNORMAL LOW (ref >60)
GFR calc non Af Amer: 4 mL/min — ABNORMAL LOW (ref >60)
Glucose, Bld: 88 mg/dL (ref 70–99)
Potassium: >7.5 mmol/L (ref 3.5–5.1)
Sodium: 133 mmol/L — ABNORMAL LOW (ref 135–145)
Total Bilirubin: 0.6 mg/dL (ref 0.3–1.2)
Total Protein: 7.7 g/dL (ref 6.5–8.1)

## 2020-02-15 LAB — CBG MONITORING, ED
Glucose-Capillary: 97 mg/dL (ref 70–99)
Glucose-Capillary: 62 mg/dL — ABNORMAL LOW (ref 70–99)

## 2020-02-15 LAB — I-STAT ARTERIAL BLOOD GAS, ED
Acid-base deficit: 20 mmol/L — ABNORMAL HIGH (ref 0.0–2.0)
Bicarbonate: 7.1 mmol/L — ABNORMAL LOW (ref 20.0–28.0)
Calcium, Ion: 1.37 mmol/L (ref 1.15–1.40)
HCT: 35 % — ABNORMAL LOW (ref 39.0–52.0)
Hemoglobin: 11.9 g/dL — ABNORMAL LOW (ref 13.0–17.0)
O2 Saturation: 95 %
Patient temperature: 98.2
Potassium: 6.3 mmol/L (ref 3.5–5.1)
Sodium: 140 mmol/L (ref 135–145)
TCO2: 8 mmol/L — ABNORMAL LOW (ref 22–32)
pCO2 arterial: 20.1 mmHg — ABNORMAL LOW (ref 32.0–48.0)
pH, Arterial: 7.152 — CL (ref 7.350–7.450)
pO2, Arterial: 93 mmHg (ref 83.0–108.0)

## 2020-02-15 LAB — CBC WITH DIFFERENTIAL/PLATELET
Abs Immature Granulocytes: 0.03 10*3/uL (ref 0.00–0.07)
Basophils Absolute: 0 10*3/uL (ref 0.0–0.1)
Basophils Relative: 0 %
Eosinophils Absolute: 0.1 10*3/uL (ref 0.0–0.5)
Eosinophils Relative: 1 %
HCT: 35.4 % — ABNORMAL LOW (ref 39.0–52.0)
Hemoglobin: 11 g/dL — ABNORMAL LOW (ref 13.0–17.0)
Immature Granulocytes: 1 %
Lymphocytes Relative: 11 %
Lymphs Abs: 0.7 10*3/uL (ref 0.7–4.0)
MCH: 28.1 pg (ref 26.0–34.0)
MCHC: 31.1 g/dL (ref 30.0–36.0)
MCV: 90.5 fL (ref 80.0–100.0)
Monocytes Absolute: 0.5 10*3/uL (ref 0.1–1.0)
Monocytes Relative: 8 %
Neutro Abs: 4.9 10*3/uL (ref 1.7–7.7)
Neutrophils Relative %: 79 %
Platelets: UNDETERMINED 10*3/uL (ref 150–400)
RBC: 3.91 MIL/uL — ABNORMAL LOW (ref 4.22–5.81)
RDW: 15.5 % (ref 11.5–15.5)
WBC: 6.2 10*3/uL (ref 4.0–10.5)
nRBC: 0 % (ref 0.0–0.2)

## 2020-02-15 LAB — URINALYSIS, ROUTINE W REFLEX MICROSCOPIC
Bilirubin Urine: NEGATIVE
Glucose, UA: NEGATIVE mg/dL
Ketones, ur: 5 mg/dL — AB
Nitrite: NEGATIVE
Protein, ur: 30 mg/dL — AB
Specific Gravity, Urine: 1.012 (ref 1.005–1.030)
WBC, UA: >50 WBC/hpf — ABNORMAL HIGH (ref 0–5)
pH: 5 (ref 5.0–8.0)

## 2020-02-15 LAB — SARS CORONAVIRUS 2 BY RT PCR (DIASORIN): SARS Coronavirus 2: NEGATIVE

## 2020-02-15 LAB — CK: Total CK: 230 U/L (ref 49–397)

## 2020-02-15 LAB — HEMOGLOBIN A1C
Hgb A1c MFr Bld: 6.2 % — ABNORMAL HIGH (ref 4.8–5.6)
Mean Plasma Glucose: 131.24 mg/dL

## 2020-02-15 LAB — PHOSPHORUS: Phosphorus: 5.9 mg/dL — ABNORMAL HIGH (ref 2.5–4.6)

## 2020-02-15 LAB — BASIC METABOLIC PANEL
Anion gap: 14 (ref 5–15)
BUN: 162 mg/dL — ABNORMAL HIGH (ref 8–23)
CO2: 9 mmol/L — ABNORMAL LOW (ref 22–32)
Calcium: 9.5 mg/dL (ref 8.9–10.3)
Chloride: 111 mmol/L (ref 98–111)
Creatinine, Ser: 10.81 mg/dL — ABNORMAL HIGH (ref 0.61–1.24)
GFR calc Af Amer: 5 mL/min — ABNORMAL LOW (ref >60)
GFR calc non Af Amer: 4 mL/min — ABNORMAL LOW (ref >60)
Glucose, Bld: 72 mg/dL (ref 70–99)
Potassium: 6.6 mmol/L (ref 3.5–5.1)
Sodium: 134 mmol/L — ABNORMAL LOW (ref 135–145)

## 2020-02-15 LAB — ETHANOL: Alcohol, Ethyl (B): <10 mg/dL (ref <10)

## 2020-02-15 MED ORDER — PRISMASOL BGK 0/2.5 32-2.5 MEQ/L IV SOLN
INTRAVENOUS | Status: DC
  Filled 2020-02-15: qty 5000

## 2020-02-15 MED ORDER — CALCIUM GLUCONATE-NACL 1-0.675 GM/50ML-% IV SOLN
1.0000 g | Freq: Once | INTRAVENOUS | Status: AC
  Administered 2020-02-15: 1000 mg via INTRAVENOUS
  Filled 2020-02-15: qty 50

## 2020-02-15 MED ORDER — POLYETHYLENE GLYCOL 3350 17 G PO PACK: 17.0000 g | PACK | Freq: Every day | ORAL | Status: DC | PRN

## 2020-02-15 MED ORDER — HEPARIN SODIUM (PORCINE) 1000 UNIT/ML DIALYSIS
1000.0000 [IU] | INTRAMUSCULAR | Status: DC | PRN
  Administered 2020-02-16: 2800 [IU] via INTRAVENOUS_CENTRAL

## 2020-02-15 MED ORDER — HEPARIN (PORCINE) 25000 UT/250ML-% IV SOLN
1600.0000 [IU]/h | INTRAVENOUS | Status: DC
  Administered 2020-02-15: 1600 [IU]/h via INTRAVENOUS
  Filled 2020-02-15 (×2): qty 250

## 2020-02-15 MED ORDER — CHLORHEXIDINE GLUCONATE CLOTH 2 % EX PADS
6.0000 | MEDICATED_PAD | Freq: Every day | CUTANEOUS | Status: DC
  Administered 2020-02-16 – 2020-02-20 (×5): 6 via TOPICAL

## 2020-02-15 MED ORDER — DOCUSATE SODIUM 100 MG PO CAPS: 100.0000 mg | ORAL_CAPSULE | Freq: Two times a day (BID) | ORAL | Status: DC | PRN

## 2020-02-15 MED ORDER — INSULIN ASPART 100 UNIT/ML ~~LOC~~ SOLN: 0.0000 [IU] | SUBCUTANEOUS | Status: DC

## 2020-02-15 MED ORDER — PRISMASOL BGK 0/2.5 32-2.5 MEQ/L IV SOLN
INTRAVENOUS | Status: DC
  Filled 2020-02-15 (×5): qty 5000

## 2020-02-15 MED ORDER — SODIUM ZIRCONIUM CYCLOSILICATE 10 G PO PACK
10.0000 g | PACK | Freq: Once | ORAL | Status: AC
  Administered 2020-02-15: 10 g via ORAL
  Filled 2020-02-15: qty 1

## 2020-02-15 MED ORDER — SODIUM BICARBONATE 8.4 % IV SOLN
INTRAVENOUS | Status: DC
  Filled 2020-02-15 (×3): qty 150

## 2020-02-15 MED ORDER — DEXTROSE 50 % IV SOLN
50.0000 mL | Freq: Once | INTRAVENOUS | Status: AC
  Administered 2020-02-15: 50 mL via INTRAVENOUS
  Filled 2020-02-15: qty 50

## 2020-02-15 MED ORDER — INSULIN ASPART 100 UNIT/ML ~~LOC~~ SOLN
10.0000 [IU] | Freq: Once | SUBCUTANEOUS | Status: AC
  Administered 2020-02-15: 10 [IU] via INTRAVENOUS

## 2020-02-15 MED ORDER — SODIUM CHLORIDE 0.9 % IV BOLUS
500.0000 mL | Freq: Once | INTRAVENOUS | Status: AC
  Administered 2020-02-15: 500 mL via INTRAVENOUS

## 2020-02-15 NOTE — Consult Note (Addendum)
[facility]  INPATIENT CONSULTATION  Reason for Consultation: AKI, hyperkalemia, acidosis Requesting Provider: Dr. Stark Jock  HPI: [name] is an 74 y.o. male with a fib on xarelto, DM, HTN, HFpEF (as of 2019 TTE), h/o BPH and prostate ca s/p prostatectomy, HTN, remote nephrolithiasis, HL who is seen for evaluation and mgmt of AKI, hyperkalemia and acidosis.  Pt brought to ED on several day history of dec appetite, weakness, mild confusion.  Went to outpt wound care clinic visit today for RLE wrap (ongoing therapy) and had a near fall he tells me.  Came to ED and found to have BUN 168, Cr >11, Bicarb 9, K > 7.5. ABG 7.15 / 20/ 93.   In A fibl 60s, BPs 90-130s/40-50s.   Rec'd NS 515mL bolus,  IV calcium, insulin, dextrose, IV bicarb gtt being started.   He tells me several weeks of poor taste but no emesis and tolerating fluids.  +frequent NSAID (advil) use but cannot really quantify.  Doesn't think on any abx or recent med changes.  Slow stream but no straining to void; dec urine volume recently.  No hematuria or tea colored urine; no hemoptysis, no rashes or oral ulcers.  Some hiccups.  Denies dysuria, flank pain.  Says he's been taking meds as rx'd includes ACEi, spironolactone, lasix, metformin, xarelto.  Bladder scan in ED 264mL.  CT head old L occipital infarct, atrophy; no acute changes. CXR some atelectasis, no edema.    Lives with daughters, says typically ambulatory some but see order for lift chair for home earlier in the year from PCP.    PMH: Past Medical History:  Diagnosis Date  . Adenoma 05/2008  . Alcoholism in recovery ([place])   . BPH (benign prostatic hyperplasia)   . Cancer [facility])    Prostate  . Chronic LBP    Hip & Back -- Sees Dr. Maia Petties @ [facility]  . Colon polyps   . Complex renal cyst 12/2010  . Controlled type 2 diabetes mellitus with neuropathy ([place]) 01/2007  . COPD (chronic obstructive pulmonary disease) ([place])   . Diabetes  ([place])   . Diverticulitis of colon   . ED (erectile dysfunction)    s/p Penile prosthesis (09/2010)  . History of prostate cancer    Dr. Karsten Ro  . History of sick sinus syndrome    reduced BB dose for Bradycardia  . HLD (hyperlipidemia)   . Hypertension   . Impaired glucose tolerance 12/21/2013  . Nephrolithiasis   . Osteoarthritis of both hips   . Osteoarthritis of lumbosacral spine    with Disc Disease  . PAF (paroxysmal atrial fibrillation) ([facility])    No longer on Amiodarone.  Not on Anticoaguation b/c no recurrence.  . Paresthesias/numbness    Bilateral LE  . Peripheral neuropathy 12/21/2013   PSH: Past Surgical History:  Procedure Laterality Date  . CARDIAC CATHETERIZATION  2003   Normal Coronary Arteries.  Marland Kitchen NM MYOVIEW LTD  03/2016   No Ischemia or Infarct - Visual EF ~60% (computer EF ~39%) - by Echo 55-60%  . PENILE PROSTHESIS IMPLANT  06/21/2012   Procedure: PENILE PROTHESIS INFLATABLE;  Surgeon: Claybon Jabs, MD;  Location: [facility];  Service: Urology;  Laterality: N/A;  REMOVAL AND REPLACEMENT OF SOME  OF PROSTHESIS ([facility])   . PENILE PROSTHESIS PLACEMENT  09/2010  . PROSTATECTOMY    . REMOVAL OF PENILE PROSTHESIS N/A 02/15/2018   Procedure: REMOVAL OF PENILE PROSTHESIS;  Surgeon: Kathie Rhodes, MD;  Location: [facility];  Service: Urology;  Laterality: N/A;  . TRANSTHORACIC ECHOCARDIOGRAM  04/2016   a) Relatively normal EF of 55-60%. No RWMA suggesting no prior MI. Gr 2 DD (pseudo-normal filling pattern) along with moderately dilated left atrium.;; b) 02/2018: Mod LVH. EF 65-70%. No RWMA.  Unable to assess DF 2/2 Afib. Mild MR. Severe BiAtrial Enlargement. High CVP.    Past Medical History:  Diagnosis Date  . Adenoma 05/2008  . Alcoholism in recovery ([place])   . BPH (benign prostatic hyperplasia)   . Cancer [facility])    Prostate  . Chronic LBP    Hip & Back -- Sees Dr. Maia Petties @ [place]  . Colon polyps   . Complex renal cyst 12/2010  .  Controlled type 2 diabetes mellitus with neuropathy ([place]) 01/2007  . COPD (chronic obstructive pulmonary disease) ([place])   . Diabetes ([place])   . Diverticulitis of colon   . ED (erectile dysfunction)    s/p Penile prosthesis (09/2010)  . History of prostate cancer    Dr. Karsten Ro  . History of sick sinus syndrome    reduced BB dose for Bradycardia  . HLD (hyperlipidemia)   . Hypertension   . Impaired glucose tolerance 12/21/2013  . Nephrolithiasis   . Osteoarthritis of both hips   . Osteoarthritis of lumbosacral spine    with Disc Disease  . PAF (paroxysmal atrial fibrillation) ([facility])    No longer on Amiodarone.  Not on Anticoaguation b/c no recurrence.  . Paresthesias/numbness    Bilateral LE  . Peripheral neuropathy 12/21/2013    Medications:  I have reviewed the patient's current medications.  (Not in a hospital admission)   ALLERGIES:   Allergies  Allergen Reactions  . Ciprofloxacin Other (See Comments)    All over weakness    FAM HX: Family History  Problem Relation Age of Onset  . Coronary artery disease Mother   . Heart attack Mother   . Coronary artery disease Father   . Prostate cancer Father   . Diabetes Father     Social History:   reports that he quit smoking about 37 years ago. His smoking use included cigarettes. He has never used smokeless tobacco. He reports current alcohol use. He reports that he does not use drugs.  ROS: 12 system ROS per HPI above  Blood pressure (!) 119/62, pulse 57, temperature 98.2 F (36.8 C), temperature source Oral, resp. rate 17, height 6\' 1"  (1.854 m), weight (!) 122.5 kg, SpO2 100 %. PHYSICAL EXAM: Gen: elderly man on stretcher, awake and alert, nontoxic  Eyes: anicteric, EOMI ENT: MMM Neck: supple CV: irreg irreg in 50-60s during exam, no peaked T waves on monitor, 1+ radial pulse, cannot palpate pedal pulses Abd:  Soft, bladder not palpable, no masses Lungs: clear to bases GU: no foley Extr: 1+ pitting edema to  knees, RLE in ace wrap Neuro: AOx3, to weak to hold arms out for asterixis exam Skin: legs cool and clammy, arms cool and dry   Results for orders placed or performed during the hospital encounter of 02/15/20 (from the past 48 hour(s))  CBG monitoring, ED     Status: None   Collection Time: 02/15/20  2:06 PM  Result Value Ref Range   Glucose-Capillary 97 70 - 99 mg/dL    Comment: Glucose reference range applies only to samples taken after fasting for at least 8 hours.   Comment 1 Notify RN    Comment 2 Document in Chart  Ethanol     Status: None   Collection Time: 02/15/20  2:30 PM  Result Value Ref Range   Alcohol, Ethyl (B) <10 <10 mg/dL    Comment: (NOTE) Lowest detectable limit for serum alcohol is 10 mg/dL.  For medical purposes only. Performed at [facility], [place] [address], [place], [place] [address]   CBC with Differential     Status: Abnormal   Collection Time: 02/15/20  2:30 PM  Result Value Ref Range   WBC 6.2 4.0 - 10.5 K/uL   RBC 3.91 (L) 4.22 - 5.81 MIL/uL   Hemoglobin 11.0 (L) 13.0 - 17.0 g/dL   HCT 35.4 (L) 39 - 52 %   MCV 90.5 80.0 - 100.0 fL   MCH 28.1 26.0 - 34.0 pg   MCHC 31.1 30.0 - 36.0 g/dL   RDW 15.5 11.5 - 15.5 %   Platelets PLATELET CLUMPS NOTED ON SMEAR, UNABLE TO ESTIMATE 150 - 400 K/uL   nRBC 0.0 0.0 - 0.2 %   Neutrophils Relative % 79 %   Neutro Abs 4.9 1.7 - 7.7 K/uL   Lymphocytes Relative 11 %   Lymphs Abs 0.7 0.7 - 4.0 K/uL   Monocytes Relative 8 %   Monocytes Absolute 0.5 0 - 1 K/uL   Eosinophils Relative 1 %   Eosinophils Absolute 0.1 0 - 0 K/uL   Basophils Relative 0 %   Basophils Absolute 0.0 0 - 0 K/uL   Immature Granulocytes 1 %   Abs Immature Granulocytes 0.03 0.00 - 0.07 K/uL    Comment: Performed at [facility], [place] [address] [place], [place] [address]  SARS Coronavirus 2 by RT PCR     Status: None   Collection Time: 02/15/20  4:09 PM  Result Value Ref Range   SARS Coronavirus 2 NEGATIVE NEGATIVE     Comment: (NOTE) Result indicates the ABSENCE of SARS-CoV-2 RNA in the patient specimen.   The lowest concentration of SARS-CoV-2 viral copies this assay can detect in nasopharyngeal swab specimens is 500 copies / mL.  A negative result does not preclude SARS-CoV-2 infection and should not be used as the sole basis for patient management decisions. A negative result may occur with improper specimen collection / handling, submission of a specimen other than nasopharyngeal swab, presence of viral mutation(s) within the areas targeted by this assay, and inadequate number of viral copies (<500 copies / mL) present.  Negative results must be combined with clinical observations, patient history, and epidemiological information.   The expected result is NEGATIVE.   Patient Fact Sheet:  BlogSelections.co.uk    Provider Fact Sheet:  https://lucas.com/    This test is not yet approved or cleared by the [place] FDA and  has been [place] orized for detection and/or diagnosis of SARS-CoV-2 by FDA under an Emergency Use Authorization (EUA).  This EUA will remain in effect (meaning this test can be used) for the duration of  the COVID-19 declaration under Section 564(b)(1) of the Act, 21 U.S.C. section 360bbb-3(b)(1), unless the authorization is terminated or revoked sooner  Performed at [facility], [place] [address] [place], [place] [address]   Comprehensive metabolic panel     Status: Abnormal   Collection Time: 02/15/20  5:47 PM  Result Value Ref Range   Sodium 133 (L) 135 - 145 mmol/L   Potassium >7.5 (HH) 3.5 - 5.1 mmol/L    Comment: REPEATED TO VERIFY NO VISIBLE HEMOLYSIS CRITICAL RESULT CALLED TO, READ  BACK BY AND VERIFIED WITH: A STRADER RN [phone] K FORSYTH    Chloride 111 98 - 111 mmol/L   CO2 9 (L) 22 - 32 mmol/L   Glucose, Bld 88 70 - 99 mg/dL    Comment: Glucose reference range applies only to samples taken after  fasting for at least 8 hours.   BUN 168 (H) 8 - 23 mg/dL   Creatinine, Ser 11.66 (H) 0.61 - 1.24 mg/dL   Calcium 9.2 8.9 - 10.3 mg/dL   Total Protein 7.7 6.5 - 8.1 g/dL   Albumin 3.3 (L) 3.5 - 5.0 g/dL   AST 13 (L) 15 - 41 U/L   ALT 9 0 - 44 U/L   Alkaline Phosphatase 64 38 - 126 U/L   Total Bilirubin 0.6 0.3 - 1.2 mg/dL   GFR calc non Af Amer 4 (L) >60 mL/min   GFR calc Af Amer 4 (L) >60 mL/min   Anion gap 13 5 - 15    Comment: Performed at [facility] [address], [place], [place] [address]  I-Stat arterial blood gas, ED     Status: Abnormal   Collection Time: 02/15/20  8:11 PM  Result Value Ref Range   pH, Arterial 7.152 (LL) 7.35 - 7.45   pCO2 arterial 20.1 (L) 32 - 48 mmHg   pO2, Arterial 93 83 - 108 mmHg   Bicarbonate 7.1 (L) 20.0 - 28.0 mmol/L   TCO2 8 (L) 22 - 32 mmol/L   O2 Saturation 95.0 %   Acid-base deficit 20.0 (H) 0.0 - 2.0 mmol/L   Sodium 140 135 - 145 mmol/L   Potassium 6.3 (HH) 3.5 - 5.1 mmol/L   Calcium, Ion 1.37 1.15 - 1.40 mmol/L   HCT 35.0 (L) 39 - 52 %   Hemoglobin 11.9 (L) 13.0 - 17.0 g/dL   Patient temperature 98.2 F    Collection site Radial    Drawn by HIDE    Sample type ARTERIAL     CT Head Wo Contrast  Result Date: 02/15/2020 CLINICAL DATA:  Mental status change EXAM: CT HEAD WITHOUT CONTRAST TECHNIQUE: Contiguous axial images were obtained from the base of the skull through the vertex without intravenous contrast. COMPARISON:  None. FINDINGS: Brain: No hemorrhage or intracranial mass is visualized. Small focus of encephalomalacia in the left occipital lobe consistent with chronic infarct. Moderate atrophy. Moderate hypodensity in the white matter likely chronic small vessel ischemic change. Prominent ventricles felt secondary to atrophy. Vascular: No hyperdense vessels.  Carotid vascular calcification Skull: Normal. Negative for fracture or focal lesion. Sinuses/Orbits: Mild mucosal thickening in the ethmoid sinuses Other: None  IMPRESSION: 1. No CT evidence for acute intracranial abnormality. 2. Atrophy and small vessel ischemic changes of the white matter. Chronic left occipital lobe infarct. Electronically Signed   By: Donavan Foil M.D.   On: 02/15/2020 16:09   DG Chest Portable 1 View  Result Date: 02/15/2020 CLINICAL DATA:  Generalized weakness. EXAM: PORTABLE CHEST 1 VIEW COMPARISON:  02/25/2018 FINDINGS: Heart size mildly enlarged. Vascularity normal. Elevated left hemidiaphragm with mild left lower lobe atelectasis. Negative for pneumonia or effusion. IMPRESSION: Mild left lower lobe atelectasis.  Negative for heart failure. Electronically Signed   By: Franchot Gallo M.D.   On: 02/15/2020 15:14    Assessment/Plan **AKI, severe:  Cr dating to mid 2019 ~1.3 then 08/2019 1.86 > 09/2019 2.17 and now presenting with BUN 168 , Cr 11.66.  Given how well compensated he appears I suspect this  is a subacute process and I don't see a readily reversible etiology (renal imaging pending).  Contributing factors likely include NSAIDs, MRB, ARB; unclear if poor po intake primary or secondary issue here.   Given severe metabolic derangements I have recommended ICU admission for CRRT initiation overnight.  Patient understandably dismayed but wishes to proceed if necessary.   D/w PCCM who will place catheter tonight - much appreciated.  He needs additional w/u to see if reversible issue identified --> will get renal imaging (renal US for now), UA, UP/C.  Pending those studies may need additional serologic evaluation and or renal biopsy.  In the meantime though we'll stabilize him.   **Hyperkalemia:  Severe, temporized in the ED, start CRRT will use 2K dialysate, pre/post filter.  Monitor serial labs.   **Metabolic acidosis: secondary to AKI above.  Na bicarb gtt for now then CRRT.   **A fib: rate controlled currently. Hold xarelto with severe AKI.  **HFpEF:  1+ LE edema, not severely decompensated at this time.    **Anemia: mild,  monitor  **DM:   Justin Mend 02/15/2020, 8:21 PM   ADDENDUM: no ICU beds available - remains hemodynamically stable.  D/w on call HD RN - will treat in dialysis unit tonight with 2hr tx, 52min 0K then 2K, no UF.  Will tentatively order another tx for tomorrow afternoon as well as he will likely need ongoing RRT at least in the short term.

## 2020-02-15 NOTE — ED Notes (Signed)
Date and time results received: 02/15/20  Test: K Critical Value: >7.5  Name of Provider Notified: DELO

## 2020-02-15 NOTE — ED Notes (Addendum)
Admitting team at bedside, CVC/HD catheter being placed currently

## 2020-02-15 NOTE — ED Provider Notes (Signed)
[facility] EMERGENCY DEPARTMENT Provider Note   CSN: [id number] Arrival date & time: 02/15/20  1351     History Chief Complaint  Patient presents with  . Weakness  . Altered Mental Status    [name] is a 74 y.o. male.  Patient with history of COPD not on home oxygen, diabetes, uses walker and lives with 2 daughters, atrial fibrillation on Xarelto presents with general weakness and mild confusion worsening over the past few days.  Decreased appetite the past 4 to 5 days.  No fevers chills or worsening shortness of breath.  Chronic cough.  Patient chronically in A. fib.  Patient is followed by wound care and was seen the last 24 hours for right leg wound and bandages applied.  Patient is complained of possible hemorrhoid/pain in buttocks area the past month.        Past Medical History:  Diagnosis Date  . Adenoma 05/2008  . Alcoholism in recovery ([place])   . BPH (benign prostatic hyperplasia)   . Cancer [facility])    Prostate  . Chronic LBP    Hip & Back -- Sees Dr. Maia Petties @ [place]  . Colon polyps   . Complex renal cyst 12/2010  . Controlled type 2 diabetes mellitus with neuropathy ([place]) 01/2007  . COPD (chronic obstructive pulmonary disease) ([place])   . Diabetes ([place])   . Diverticulitis of colon   . ED (erectile dysfunction)    s/p Penile prosthesis (09/2010)  . History of prostate cancer    Dr. Karsten Ro  . History of sick sinus syndrome    reduced BB dose for Bradycardia  . HLD (hyperlipidemia)   . Hypertension   . Impaired glucose tolerance 12/21/2013  . Nephrolithiasis   . Osteoarthritis of both hips   . Osteoarthritis of lumbosacral spine    with Disc Disease  . PAF (paroxysmal atrial fibrillation) (H[facility])    No longer on Amiodarone.  Not on Anticoaguation b/c no recurrence.  . Paresthesias/numbness    Bilateral LE  . Peripheral neuropathy 12/21/2013    Patient Active Problem List   Diagnosis Date Noted  . Diabetic leg  ulcer ([place]) 10/06/2019  . CKD (chronic kidney disease) stage 3, GFR 30-59 ml/min 10/06/2019  . Diabetic ulcer of right lower leg ([place]) 02/23/2019  . Abnormal LFTs 04/27/2018  . Open wound of scrotum 03/01/2018  . Acute on chronic diastolic CHF (congestive heart failure) ([place]) 02/25/2018  . Postoperative anemia due to acute blood loss 02/25/2018  . Occipital stroke ([place]), left 09/23/2017  . Diabetes mellitus type II, non insulin dependent ([place])   . Encounter for well adult exam with abnormal findings 07/31/2017  . Swelling of lower extremity 04/04/2016  . Junctional tachycardia ([place]) 03/18/2015  . Exertional dyspnea 07/05/2014  . Obesity (BMI 30-39.9) 05/04/2014  . Benign essential tremor 05/04/2014  . Peripheral neuropathy 12/21/2013  . Nonspecific abnormal finding in stool contents 09/06/2012  . Heme positive stool 08/09/2012  . Difficulty passing stool 08/09/2012  . Sebaceous cyst 04/15/2012  . Complex renal cyst 01/13/2011  . Back pain 01/02/2011  . PAF (paroxysmal atrial fibrillation) - now off amiodarone; asymptomatic. CHA2DS2-VASc Score -5 (on Xarelto) 01/02/2011  . KNEE PAIN, LEFT 08/27/2009  . COLONIC POLYPS, HX OF 08/08/2008  . ERECTILE DYSFUNCTION 09/27/2007  . ULNAR NEUROPATHY 09/27/2007  . VENTRICULAR HYPERTROPHY, LEFT 09/27/2007  . ALLERGIC RHINITIS 09/27/2007  . ARTHRITIS 09/27/2007  . GANGLION CYST, WRIST, LEFT 09/27/2007  . HEMORRHOIDS, INTERNAL 06/10/2007  .  DIVERTICULOSIS, COLON 06/10/2007  . [place] DISEASE, LUMBAR 06/03/2007  . NEPHROLITHIASIS, HX OF 06/03/2007  . Diabetes mellitus with neuropathy ([place]) 02/11/2007  . Hyperlipidemia with target LDL less than 100 02/11/2007  . ANXIETY 02/11/2007  . Nondependent Alcohol Abuse, in Remission 02/11/2007  . Hypertensive heart disease with chronic diastolic congestive heart failure ([place]) 02/11/2007  . COPD (chronic obstructive pulmonary disease) ([place]) 02/11/2007  . BENIGN PROSTATIC HYPERTROPHY 02/11/2007  . LOW BACK  PAIN 02/11/2007  . Disturbance of skin sensation 02/11/2007  . PROSTATE CANCER, HX OF 02/11/2007    Past Surgical History:  Procedure Laterality Date  . CARDIAC CATHETERIZATION  2003   Normal Coronary Arteries.  Marland Kitchen NM MYOVIEW LTD  03/2016   No Ischemia or Infarct - Visual EF ~60% (computer EF ~39%) - by Echo 55-60%  . PENILE PROSTHESIS IMPLANT  06/21/2012   Procedure: PENILE PROTHESIS INFLATABLE;  Surgeon: Claybon Jabs, MD;  Location: [facility];  Service: Urology;  Laterality: N/A;  REMOVAL AND REPLACEMENT OF SOME  OF PROSTHESIS ([facility])   . PENILE PROSTHESIS PLACEMENT  09/2010  . PROSTATECTOMY    . REMOVAL OF PENILE PROSTHESIS N/A 02/15/2018   Procedure: REMOVAL OF PENILE PROSTHESIS;  Surgeon: Kathie Rhodes, MD;  Location: [facility];  Service: Urology;  Laterality: N/A;  . TRANSTHORACIC ECHOCARDIOGRAM  04/2016   a) Relatively normal EF of 55-60%. No RWMA suggesting no prior MI. Gr 2 DD (pseudo-normal filling pattern) along with moderately dilated left atrium.;; b) 02/2018: Mod LVH. EF 65-70%. No RWMA.  Unable to assess DF 2/2 Afib. Mild MR. Severe BiAtrial Enlargement. High CVP.       Family History  Problem Relation Age of Onset  . Coronary artery disease Mother   . Heart attack Mother   . Coronary artery disease Father   . Prostate cancer Father   . Diabetes Father     Social History   Tobacco Use  . Smoking status: Former Smoker    Types: Cigarettes    Quit date: 11/04/1982    Years since quitting: 37.3  . Smokeless tobacco: Never Used  Vaping Use  . Vaping Use: Never used  Substance Use Topics  . Alcohol use: Yes    Comment: no alcohol x 1 week wants to stop  . Drug use: No    Types: Marijuana    Comment: use to smoke marijuana    Home Medications Prior to Admission medications   Medication Sig Start Date End Date Taking? Authorizing Provider  Albuterol Sulfate 108 (90 Base) MCG/ACT AEPB Inhale 2 puffs into the lungs every 6 (six) hours as  needed. Patient taking differently: Inhale 2 puffs into the lungs every 6 (six) hours as needed (shortness of breath).  12/28/17   Biagio Borg, MD  amLODipine (NORVASC) 10 MG tablet TAKE 1 TABLET BY MOUTH  DAILY 06/13/19   Leonie Man, MD  benazepril (LOTENSIN) 40 MG tablet TAKE 1 TABLET BY MOUTH  DAILY 05/09/19   Leonie Man, MD  Blood Glucose Monitoring Suppl (ONE TOUCH ULTRA 2) w/Device KIT Use as directed E11.9 09/01/17   Biagio Borg, MD  doxycycline (VIBRA-TABS) 100 MG tablet Take 1 tablet (100 mg total) by mouth 2 (two) times daily. 10/06/19   Biagio Borg, MD  furosemide (LASIX) 40 MG tablet TAKE 2 TABLETS BY  MOUTH in AM and 1 in the PM 10/06/19   Biagio Borg, MD  glucose blood (ONE TOUCH ULTRA TEST) test strip Use as  instructed once per day E11.9 12/10/18   Biagio Borg, MD  Ibuprofen 200 MG CAPS Take 200 mg by mouth. Take 1 to 2 tablet twice a day as needed    [provider]  loratadine (CLARITIN) 10 MG tablet Take 10 mg by mouth daily as needed for allergies.    [provider]  metFORMIN (GLUCOPHAGE-XR) 500 MG 24 hr tablet TAKE 1 TABLET BY MOUTH  DAILY WITH BREAKFAST 10/31/19   Biagio Borg, MD  metoprolol succinate (TOPROL-XL) 100 MG 24 hr tablet Take 1 tablet (100 mg total) by mouth daily. Take with or immediately following a meal. 08/23/19   Leonie Man, MD  omeprazole (PRILOSEC) 20 MG capsule TAKE 1 CAPSULE BY MOUTH  DAILY 08/22/19   Leonie Man, MD  OneTouch Delica Lancets 02O MISC USE 1 TIME PER DAY 10/06/19   Biagio Borg, MD  potassium chloride SA (KLOR-CON) 20 MEQ tablet TAKE 1 TO 2 TABLETS BY  MOUTH DAILY AS NEEDED FOR  WEIGHT GAIN OF 3LBS OR MORE 10/31/19   Leonie Man, MD  pravastatin (PRAVACHOL) 40 MG tablet TAKE 1 TABLET BY MOUTH  DAILY 05/09/19   Leonie Man, MD  spironolactone (ALDACTONE) 25 MG tablet Take 1 tablet (25 mg total) by mouth daily. 10/07/19   Biagio Borg, MD  Vitamin D, Ergocalciferol, (DRISDOL) 1.25 MG (50000  UNIT) CAPS capsule Take 1 capsule (50,000 Units total) by mouth every 7 (seven) days. 10/07/19   Biagio Borg, MD  XARELTO 20 MG TABS tablet TAKE 1 TABLET BY MOUTH  DAILY WITH SUPPER 08/31/19   Leonie Man, MD    Allergies    Ciprofloxacin  Review of Systems   Review of Systems  Constitutional: Positive for appetite change. Negative for chills and fever.  HENT: Negative for congestion.   Eyes: Negative for visual disturbance.  Respiratory: Positive for cough.   Cardiovascular: Negative for chest pain.  Gastrointestinal: Negative for abdominal pain and vomiting.  Genitourinary: Negative for dysuria and flank pain.  Musculoskeletal: Negative for back pain, neck pain and neck stiffness.  Skin: Negative for rash.  Neurological: Positive for weakness. Negative for light-headedness and headaches.    Physical Exam Updated Vital Signs BP (!) 102/54   Pulse 71   Temp 98.2 F (36.8 C) (Oral)   Resp (!) 24   Ht '6\' 1"'  (1.854 m)   Wt (!) 122.5 kg   SpO2 100%   BMI 35.62 kg/m   Physical Exam Vitals and nursing note reviewed.  Constitutional:      Appearance: He is well-developed.  HENT:     Head: Normocephalic and atraumatic.     Comments: Dry mm Eyes:     General:        Right eye: No discharge.        Left eye: No discharge.     Conjunctiva/sclera: Conjunctivae normal.  Neck:     Trachea: No tracheal deviation.  Cardiovascular:     Rate and Rhythm: Normal rate and regular rhythm.  Pulmonary:     Effort: Pulmonary effort is normal.     Breath sounds: Normal breath sounds.  Abdominal:     General: There is no distension.     Palpations: Abdomen is soft.     Tenderness: There is no abdominal tenderness. There is no guarding.  Musculoskeletal:     Cervical back: Normal range of motion and neck supple.  Skin:    General: Skin is warm.  Comments:  patient has superficial area of skin breakdown gluteal cleft base of sacrum.  Patient has bandages right lower extremity  placed this morning.  Neurological:     Mental Status: He is alert and oriented to person, place, and time.     Comments: Patient is generally weak on exam no focal deficits.  Finger-nose intact bilateral, extraocular muscle function intact.  Gross sensation intact bilateral.     ED Results / Procedures / Treatments   Labs (all labs ordered are listed, but only abnormal results are displayed) Labs Reviewed  SARS CORONAVIRUS 2 BY RT PCR (HOSPITAL ORDER, [facility])  ETHANOL  COMPREHENSIVE METABOLIC PANEL  CBC WITH DIFFERENTIAL/PLATELET  URINALYSIS, ROUTINE W REFLEX MICROSCOPIC  CBG MONITORING, ED    EKG EKG Interpretation  Date/Time:  Wednesday February 15 2020 14:03:33 EDT Ventricular Rate:  67 PR Interval:    QRS Duration: 153 QT Interval:  426 QTC Calculation: 437 R Axis:   -50 Text Interpretation: Atrial flutter Ventricular premature complex IVCD, consider atypical RBBB LVH with secondary repolarization abnormality Anterior infarct, old Confirmed by Elnora Morrison [phone]) on 02/15/2020 2:45:12 PM   Radiology No results found.  Procedures Procedures (including critical care time)  Medications Ordered in ED Medications  sodium chloride 0.9 % bolus 500 mL (has no administration in time range)    ED Course  I have reviewed the triage vital signs and the nursing notes.  Pertinent labs & imaging results that were available during my care of the patient were reviewed by me and considered in my medical decision making (see chart for details).    MDM Rules/Calculators/A&P                          Patient presents with worsening general weakness and mild confusion without focal deficits on exam.  Discussed broad differential diagnosis and plan to check basic blood work for electrolytes, anemia, chest x-ray for any signs of pneumonia, CT scan of the head for any bleeding since patient is on Xarelto and has A. fib history and urinalysis for signs of  infection.  Covid test pending.  Discussed plan with daughter and patient.  IV fluid bolus 500 cc given for clinical dehydration decreased appetite the past 5 days.  EKG reviewed atrial fibrillation normal rate.  Patient care be signed out to reassess, follow-up results and final disposition.    Final Clinical Impression(s) / ED Diagnoses Final diagnoses:  General weakness  Decubitus ulcer of sacral region, stage 1    Rx / DC Orders ED Discharge Orders    None       Elnora Morrison, MD 02/15/20 1459

## 2020-02-15 NOTE — Progress Notes (Addendum)
[name], [name] ([id number]) Visit Report for 02/15/2020 Arrival Information Details Patient Name: Date of Service: Grandville Silos ND, [place] 02/15/2020 10:00 A M Medical Record Number: [id number] Patient Account Number: [id number] Date of Birth/Sex: Treating RN: 10-12-1945 (74 y.o. [name]) Carlene Coria Primary Care Tanvir Hipple: Cathlean Cower Other Clinician: Referring Baylyn Sickles: Treating Matheo Rathbone/Extender: Roque Cash in Treatment: 3 Visit Information History Since Last Visit All ordered tests and consults were completed: No Patient Arrived: Wheel Chair Added or deleted any medications: No Arrival Time: 11:14 Any new allergies or adverse reactions: No Accompanied By: daughter Had a fall or experienced change in No Transfer Assistance: None activities of daily living that may affect Patient Identification Verified: Yes risk of falls: Secondary Verification Process Completed: Yes Signs or symptoms of abuse/neglect since last visito No Patient Requires Transmission-Based Precautions: No Hospitalized since last visit: No Patient Has Alerts: Yes Implantable device outside of the clinic excluding No Patient Alerts: Patient on Blood Thinner cellular tissue based products placed in the center ABI 1.07 right since last visit: Has Dressing in Place as Prescribed: Yes Has Compression in Place as Prescribed: Yes Pain Present Now: No Electronic Signature(s) Signed: 02/15/2020 6:04:59 PM By: Carlene Coria RN Entered By: Carlene Coria on 02/15/2020 11:14:37 -------------------------------------------------------------------------------- Compression Therapy Details Patient Name: Date of Service: Grandville Silos ND, [place] 02/15/2020 10:00 A M Medical Record Number: [id number] Patient Account Number: [id number] Date of Birth/Sex: Treating RN: June 16, 1946 (74 y.o. [name] Primary Care Kaegan Stigler: Cathlean Cower Other Clinician: Referring Mia Winthrop: Treating Valoria Tamburri/Extender: Roque Cash in Treatment: 3 Compression Therapy Performed for Wound Assessment: Wound #5 Right,Anterior Ankle Performed By: Clinician Baruch Gouty, RN Compression Type: Four Layer Post Procedure Diagnosis Same as Pre-procedure Electronic Signature(s) Signed: 02/15/2020 6:38:59 PM By: Baruch Gouty RN, BSN Entered By: Baruch Gouty on 02/15/2020 12:03:00 -------------------------------------------------------------------------------- Encounter Discharge Information Details Patient Name: Date of Service: Grandville Silos ND, [place] 02/15/2020 10:00 A M Medical Record Number: [id number] Patient Account Number: [id number] Date of Birth/Sex: Treating RN: 09/07/1945 (74 y.o. [name] Primary Care Melyna Huron: Cathlean Cower Other Clinician: Referring Zaul Hubers: Treating [name]/Extender: Roque Cash in Treatment: 3 Encounter Discharge Information Items Discharge Condition: Stable Ambulatory Status: Wheelchair Discharge Destination: Home Transportation: Private Auto Accompanied By: daughter Schedule Follow-up Appointment: Yes Clinical Summary of Care: Patient Declined Electronic Signature(s) Signed: 02/15/2020 6:13:34 PM By: Levan Hurst RN, BSN Entered By: Levan Hurst on 02/15/2020 16:57:59 -------------------------------------------------------------------------------- Lower Extremity Assessment Details Patient Name: Date of Service: Grandville Silos ND, [place] 02/15/2020 10:00 [place] Record Number: [id number] Patient Account Number: [id number] Date of Birth/Sex: Treating RN: [birth year] (74 y.o. [name]) Carlene Coria Primary Care Bradshaw Minihan: Cathlean Cower Other Clinician: Referring Icelyn Navarrete: Treating [name]/Extender: Roque Cash in Treatment: 3 Edema Assessment Assessed: [Left: No] [Right: No] Edema: [Left: Ye] [Right: s] Calf Left: Right: Point of Measurement: 47 cm From Medial Instep cm 41 cm Ankle Left:  Right: Point of Measurement: 10 cm From Medial Instep cm 25.6 cm Electronic Signature(s) Signed: 02/15/2020 6:04:59 PM By: Carlene Coria RN Entered By: Carlene Coria on 02/15/2020 11:21:38 -------------------------------------------------------------------------------- [place] Details Patient Name: Date of Service: Grandville Silos ND, [place] 02/15/2020 10:00 A M Medical Record Number: [id number] Patient Account Number: [id number] Date of Birth/Sex: Treating RN: 1946-07-16 (74 y.o. [name] Primary Care [name]: Cathlean Cower Other Clinician: Referring Saintclair Schroader: Treating [name]/Extender: Roque Cash in Treatment: 3 Active Inactive Electronic  Signature(s) Signed: 04/10/2020 7:59:49 AM By: Levan Hurst RN, BSN Signed: 04/19/2020 4:43:57 PM By: Baruch Gouty RN, BSN Previous Signature: 02/15/2020 6:38:59 PM Version By: Baruch Gouty RN, BSN Entered By: Levan Hurst on 04/06/2020 10:35:38 -------------------------------------------------------------------------------- Pain Assessment Details Patient Name: Date of Service: Grandville Silos ND, [place] RNELL 02/15/2020 10:00 A M Medical Record Number: [id number] Patient Account Number: [id number] Date of Birth/Sex: Treating RN: [birth year] (74 y.o. [name] Primary Care [name]: Cathlean Cower Other Clinician: Referring Marck Mcclenny: Treating Francess Mullen/Extender: Roque Cash in Treatment: 3 Active Problems Location of Pain Severity and Description of Pain Patient Has Paino No Site Locations Pain Management and Medication Current Pain Management: Electronic Signature(s) Signed: 02/15/2020 6:04:59 PM By: Carlene Coria RN Entered By: Carlene Coria on 02/15/2020 11:14:58 -------------------------------------------------------------------------------- Patient/Caregiver Education Details Patient Name: Date of Service: Grandville Silos ND, [place] 7/28/2021andnbsp10:00 [place] Record Number: [id number] Patient Account Number: [id number] Date of Birth/Gender: Treating RN: 18-Apr-1946 (74 y.o. [name] Primary Care Physician: Cathlean Cower Other Clinician: Referring Physician: Treating Physician/Extender: Roque Cash in Treatment: 3 Education Assessment Education Provided To: Patient Education Topics Provided Venous: Methods: Explain/Verbal Responses: Reinforcements needed, State content correctly Wound/Skin Impairment: Methods: Explain/Verbal Responses: Reinforcements needed, State content correctly Electronic Signature(s) Signed: 02/15/2020 6:38:59 PM By: Baruch Gouty RN, BSN Entered By: Baruch Gouty on 02/15/2020 12:01:06 -------------------------------------------------------------------------------- Wound Assessment Details Patient Name: Date of Service: Grandville Silos ND, [place] RNELL 02/15/2020 10:00 A M Medical Record Number: [id number] Patient Account Number: [id number] Date of Birth/Sex: Treating RN: 01-04-1946 (74 y.o. [name]) Carlene Coria Primary Care Glenford Garis: Cathlean Cower Other Clinician: Referring Daniela Hernan: Treating Kendell Gammon/Extender: Roque Cash in Treatment: 3 Wound Status Wound Number: 5 Primary Diabetic Wound/Ulcer of the Lower Extremity Etiology: Wound Location: Right, Anterior Ankle Secondary Lymphedema Wounding Event: Gradually Appeared Etiology: Date Acquired: 12/20/2019 Wound Open Weeks Of Treatment: 3 Status: Clustered Wound: No Comorbid Chronic Obstructive Pulmonary Disease (COPD), Arrhythmia, History: Hypertension, Type II Diabetes, Osteoarthritis, Neuropathy Photos Photo Uploaded By: Mikeal Hawthorne on 02/16/2020 09:51:18 Wound Measurements Length: (cm) 0.3 Width: (cm) 0.9 Depth: (cm) 0.1 Area: (cm) 0.212 Volume: (cm) 0.021 % Reduction in Area: 64% % Reduction in Volume: 64.4% Epithelialization: Small (1-33%) Tunneling: No Undermining: No Wound  Description Classification: Grade 2 Wound Margin: Flat and Intact Exudate Amount: Small Exudate Type: Serosanguineous Exudate Color: red, brown Foul Odor After Cleansing: No Slough/Fibrino Yes Wound Bed Granulation Amount: Medium (34-66%) Exposed Structure Granulation Quality: Pink Fascia Exposed: No Necrotic Amount: Medium (34-66%) Fat Layer (Subcutaneous Tissue) Exposed: Yes Necrotic Quality: Adherent Slough Tendon Exposed: No Muscle Exposed: No Joint Exposed: No Bone Exposed: No Electronic Signature(s) Signed: 02/15/2020 6:04:59 PM By: Carlene Coria RN Entered By: Carlene Coria on 02/15/2020 11:21:59 -------------------------------------------------------------------------------- Vitals Details Patient Name: Date of Service: Grandville Silos [place], [place]NE[place] 02/15/2020 10:00 A M Medical Record Number: [id number] Patient Account Number: [id number] Date of Birth/Sex: Treating RN: Dec 23, 1945 (74 y.o. [name]) Carlene Coria Primary Care Douglas Rooks: Cathlean Cower Other Clinician: Referring Amariyon Maynes: Treating Aalivia Mcgraw/Extender: Roque Cash in Treatment: 3 Vital Signs Time Taken: 11:14 Temperature (F): 98.2 Height (in): 73 Pulse (bpm): 72 Weight (lbs): 275 Respiratory Rate (breaths/min): 18 Body Mass Index (BMI): 36.3 Blood Pressure (mmHg): 108/55 Reference Range: 80 - 120 mg / dl Electronic Signature(s) Signed: 02/15/2020 6:04:59 PM By: Carlene Coria RN Entered By: Carlene Coria on 02/15/2020 11:14:52

## 2020-02-15 NOTE — H&P (Signed)
NAMEMaylon Sailors, MRN:  [id number], DOB:  25-Feb-1946, LOS: 0 ADMISSION DATE:  02/15/2020, CONSULTATION DATE:  02/15/20 REFERRING MD:  Stark Jock  CHIEF COMPLAINT:  AMS   Brief History   [name] is a 74 y.o. male who was admitted 7/28 with significant AKI, metabolic acidosis, hyperkalemia.  History of present illness   [name] is a 74 y.o. male who has a PMH including but not limited to DM and hypertension.  (see "past medical history" for rest).  He presented to [facility] ED 7/28 with generalized weakness for a few days along with AMS.  He was found to have multiple metabolic derangements including significant AKI with SCr 11.66 (baseline around 1), BUN 168, CO2 9, K > 7.5, Na 133.  Creatinine was 2.2 in March 2021  He received temporizing measures for hyperkalemia and was evaluated by nephrology who recommended ICU admission with possible dialysis at some point (either tonight or in next day or so).  Past Medical History  has Diabetes mellitus with neuropathy ([place]); Hyperlipidemia with target LDL less than 100; ANXIETY; ERECTILE DYSFUNCTION; Nondependent Alcohol Abuse, in Remission; ULNAR NEUROPATHY; Hypertensive heart disease with chronic diastolic congestive heart failure ([place]); VENTRICULAR HYPERTROPHY, LEFT; HEMORRHOIDS, INTERNAL; ALLERGIC RHINITIS; COPD (chronic obstructive pulmonary disease) ([place]); DIVERTICULOSIS, COLON; BENIGN PROSTATIC HYPERTROPHY; ARTHRITIS; KNEE PAIN, LEFT; DISC DISEASE, LUMBAR; LOW BACK PAIN; GANGLION CYST, WRIST, LEFT; Disturbance of skin sensation; PROSTATE CANCER, HX OF; COLONIC POLYPS, HX OF; NEPHROLITHIASIS, HX OF; Back pain; PAF (paroxysmal atrial fibrillation) - now off amiodarone; asymptomatic. CHA2DS2-VASc Score -5 (on Xarelto); Complex renal cyst; Sebaceous cyst; Heme positive stool; Difficulty passing stool; Nonspecific abnormal finding in stool contents; Peripheral neuropathy; Obesity (BMI 30-39.9); Benign essential tremor; Exertional  dyspnea; Junctional tachycardia ([place]); Swelling of lower extremity; Encounter for well adult exam with abnormal findings; Diabetes mellitus type II, non insulin dependent ([place]); Occipital stroke ([place]), left; Acute on chronic diastolic CHF (congestive heart failure) ([place]); Postoperative anemia due to acute blood loss; Open wound of scrotum; Abnormal LFTs; Diabetic ulcer of right lower leg ([place]); Diabetic leg ulcer ([place]); and CKD (chronic kidney disease) stage 3, GFR 30-59 ml/min on their problem list.  Significant Hospital Events   7/28 > admit.  Consults:  Nephrology.  Procedures:  L HD line insertion.  Significant Diagnostic Tests:  CT head 7/28 > negative for acute process.  Micro Data:  COVID 7/28 > neg.  Antimicrobials:  None.   Interim history/subjective:  AMS has improved since arrival in ED  Objective:  Blood pressure (!) 119/62, pulse 57, temperature 98.2 F (36.8 C), temperature source Oral, resp. rate 17, height _0  (1.854 m), weight (!) 122.5 kg, SpO2 100 %.       No intake or output data in the 24 hours ending 02/15/20 1945 Filed Weights   02/15/20 1357  Weight: (!) 122.5 kg    Examination: General: pleasant mand appears stated age, in NAD. Neuro: alert and oriented but response slow and tangential at times. No focal motor or sensory deficits.Marland Kitchen HEENT: [place]/AT. Sclerae anicteric.  Thyroid normal. Cardiovascular: Normal HS, no M/R/G.  Lungs: Respirations even and unlabored.  CTA bilaterally, No W/R/R.  On room air Abdomen: BS x 4, soft, NT/ND.  Musculoskeletal: bilateral LE edema with Unna boot right leg Skin: Intact, warm, no rashes.  Assessment & Plan:   Critically ill due to significant AKI with metabolic acidosis and hyperkalemia - s/p temporizing measures in ED.  - Nephrology consulted by EDP, planning for CRRT tonight. - Start  HCO3 gtt in meantime. - Frequent BMP's q8hrs.  Chronic renal insufficiency. Unclear how much recovery there may be long term.   - hold ACEi, diuretics.  - US kidneys - Bladder scan and Foley if necessary.   Acute encephalopathy - presumed 2/2 uremia in setting above.  CT head negative. - Supportive care as above.  Hx PAF (on xarelto), HTN, HLD. - Heparin gtt in lieu of home xarelto. - Hold home amlodipine, benazepril, furosemide, toprol-xl, pravastatin, spironolactone, xarelto.  Hx DM. - SSI. - Hold home metformin.  Hx COPD. - BD's.  Hx LLE wound - receiving hyperbaric O2 therapy as outpatient. - Follow up as outpatient.  Best Practice:  Diet: NPO. Pain/Anxiety/Delirium protocol (if indicated): N/A. VAP protocol (if indicated): N/A. DVT prophylaxis: SCD's / Heparin gtt. GI prophylaxis: N/A. Glucose control: SSI. Mobility: Bedrest. Code Status: Full. Family Communication: None. Disposition: ICU.  Labs   CBC: Recent Labs  Lab 02/15/20 1430  WBC 6.2  NEUTROABS 4.9  HGB 11.0*  HCT 35.4*  MCV 90.5  PLT PLATELET CLUMPS NOTED ON SMEAR, UNABLE TO ESTIMATE   Basic Metabolic Panel: Recent Labs  Lab 02/15/20 1747  NA 133*  K >7.5*  CL 111  CO2 9*  GLUCOSE 88  BUN 168*  CREATININE 11.66*  CALCIUM 9.2   GFR: Estimated Creatinine Clearance: 7.7 mL/min (A) (by C-G formula based on SCr of 11.66 mg/dL (H)). Recent Labs  Lab 02/15/20 1430  WBC 6.2   Liver Function Tests: Recent Labs  Lab 02/15/20 1747  AST 13*  ALT 9  ALKPHOS 64  BILITOT 0.6  PROT 7.7  ALBUMIN 3.3*   No results for input(s): LIPASE, AMYLASE in the last 168 hours. No results for input(s): AMMONIA in the last 168 hours. ABG    Component Value Date/Time   TCO2 24 06/21/2012 0755    Coagulation Profile: No results for input(s): INR, PROTIME in the last 168 hours. Cardiac Enzymes: No results for input(s): CKTOTAL, CKMB, CKMBINDEX, TROPONINI in the last 168 hours. HbA1C: Hgb A1c MFr Bld  Date/Time Value Ref Range Status  10/06/2019 04:36 PM 7.1 (H) 4.6 - 6.5 % Final    Comment:    Glycemic Control  Guidelines for People with Diabetes:Non Diabetic:  <6%Goal of Therapy: <7%Additional Action Suggested:  >8%   02/10/2018 10:53 AM 6.5 (H) 4.8 - 5.6 % Final    Comment:    (NOTE) Pre diabetes:          5.7%-6.4% Diabetes:              >6.4% Glycemic control for   <7.0% adults with diabetes    CBG: Recent Labs  Lab 02/15/20 1406  GLUCAP 97    Review of Systems:   Review of Systems  All other systems reviewed and are negative.  Past medical history  He,  has a past medical history of Adenoma (05/2008), Alcoholism in recovery [facility]), BPH (benign prostatic hyperplasia), Cancer ([place]), Chronic LBP, Colon polyps, Complex renal cyst (12/2010), Controlled type 2 diabetes mellitus with neuropathy ([place]) (01/2007), COPD (chronic obstructive pulmonary disease) ([place]), Diabetes ([place]), Diverticulitis of colon, ED (erectile dysfunction), History of prostate cancer, History of sick sinus syndrome, HLD (hyperlipidemia), Hypertension, Impaired glucose tolerance (12/21/2013), Nephrolithiasis, Osteoarthritis of both hips, Osteoarthritis of lumbosacral spine, PAF (paroxysmal atrial fibrillation) ([place]), Paresthesias/numbness, and Peripheral neuropathy (12/21/2013).   Surgical History    Past Surgical History:  Procedure Laterality Date  . CARDIAC CATHETERIZATION  2003   Normal Coronary Arteries.  Marland Kitchen NM  MYOVIEW LTD  03/2016   No Ischemia or Infarct - Visual EF ~60% (computer EF ~39%) - by Echo 55-60%  . PENILE PROSTHESIS IMPLANT  06/21/2012   Procedure: PENILE PROTHESIS INFLATABLE;  Surgeon: Claybon Jabs, MD;  Location: [facility];  Service: Urology;  Laterality: N/A;  REMOVAL AND REPLACEMENT OF SOME  OF PROSTHESIS (AMS)   . PENILE PROSTHESIS PLACEMENT  09/2010  . PROSTATECTOMY    . REMOVAL OF PENILE PROSTHESIS N/A 02/15/2018   Procedure: REMOVAL OF PENILE PROSTHESIS;  Surgeon: Kathie Rhodes, MD;  Location: [facility];  Service: Urology;  Laterality: N/A;  . TRANSTHORACIC ECHOCARDIOGRAM  04/2016    a) Relatively normal EF of 55-60%. No RWMA suggesting no prior MI. Gr 2 DD (pseudo-normal filling pattern) along with moderately dilated left atrium.;; b) 02/2018: Mod LVH. EF 65-70%. No RWMA.  Unable to assess DF 2/2 Afib. Mild MR. Severe BiAtrial Enlargement. High CVP.     Social History   reports that he quit smoking about 37 years ago. His smoking use included cigarettes. He has never used smokeless tobacco. He reports current alcohol use. He reports that he does not use drugs.   Family history   His family history includes Coronary artery disease in his father and mother; Diabetes in his father; Heart attack in his mother; Prostate cancer in his father.   Allergies Allergies  Allergen Reactions  . Ciprofloxacin Other (See Comments)    All over weakness     Home meds  Prior to Admission medications   Medication Sig Start Date End Date Taking? Authorizing Provider  Albuterol Sulfate 108 (90 Base) MCG/ACT AEPB Inhale 2 puffs into the lungs every 6 (six) hours as needed. Patient taking differently: Inhale 2 puffs into the lungs every 6 (six) hours as needed (shortness of breath).  12/28/17   Biagio Borg, MD  amLODipine (NORVASC) 10 MG tablet TAKE 1 TABLET BY MOUTH  DAILY 06/13/19   Leonie Man, MD  benazepril (LOTENSIN) 40 MG tablet TAKE 1 TABLET BY MOUTH  DAILY 05/09/19   Leonie Man, MD  Blood Glucose Monitoring Suppl (ONE TOUCH ULTRA 2) w/Device KIT Use as directed E11.9 09/01/17   Biagio Borg, MD  doxycycline (VIBRA-TABS) 100 MG tablet Take 1 tablet (100 mg total) by mouth 2 (two) times daily. 10/06/19   Biagio Borg, MD  furosemide (LASIX) 40 MG tablet TAKE 2 TABLETS BY  MOUTH in AM and 1 in the PM 10/06/19   Biagio Borg, MD  glucose blood (ONE TOUCH ULTRA TEST) test strip Use as instructed once per day E11.9 12/10/18   Biagio Borg, MD  Ibuprofen 200 MG CAPS Take 200 mg by mouth. Take 1 to 2 tablet twice a day as needed    [provider]  loratadine (CLARITIN)  10 MG tablet Take 10 mg by mouth daily as needed for allergies.    [provider]  metFORMIN (GLUCOPHAGE-XR) 500 MG 24 hr tablet TAKE 1 TABLET BY MOUTH  DAILY WITH BREAKFAST 10/31/19   Biagio Borg, MD  metoprolol succinate (TOPROL-XL) 100 MG 24 hr tablet Take 1 tablet (100 mg total) by mouth daily. Take with or immediately following a meal. 08/23/19   Leonie Man, MD  omeprazole (PRILOSEC) 20 MG capsule TAKE 1 CAPSULE BY MOUTH  DAILY 08/22/19   Leonie Man, MD  OneTouch Delica Lancets 44H MISC USE 1 TIME PER DAY 10/06/19   Biagio Borg, MD  potassium chloride SA (KLOR-CON) 20 MEQ tablet TAKE 1 TO 2 TABLETS BY  MOUTH DAILY AS NEEDED FOR  WEIGHT GAIN OF 3LBS OR MORE 10/31/19   Leonie Man, MD  pravastatin (PRAVACHOL) 40 MG tablet TAKE 1 TABLET BY MOUTH  DAILY 05/09/19   Leonie Man, MD  spironolactone (ALDACTONE) 25 MG tablet Take 1 tablet (25 mg total) by mouth daily. 10/07/19   Biagio Borg, MD  Vitamin D, Ergocalciferol, (DRISDOL) 1.25 MG (50000 UNIT) CAPS capsule Take 1 capsule (50,000 Units total) by mouth every 7 (seven) days. 10/07/19   Biagio Borg, MD  XARELTO 20 MG TABS tablet TAKE 1 TABLET BY MOUTH  DAILY WITH SUPPER 08/31/19   Leonie Man, MD   CRITICAL CARE Performed by: Kipp Brood   Total critical care time: 35 minutes  Critical care time was exclusive of separately billable procedures and treating other patients.  Critical care was necessary to treat or prevent imminent or life-threatening deterioration.  Critical care was time spent personally by me on the following activities: development of treatment plan with patient and/or surrogate as well as nursing, discussions with consultants, evaluation of patient's response to treatment, examination of patient, obtaining history from patient or surrogate, ordering and performing treatments and interventions, ordering and review of laboratory studies, ordering and review of radiographic studies, pulse  oximetry, re-evaluation of patient's condition and participation in multidisciplinary rounds.  Kipp Brood, MD [facility] ICU Physician [place]  Pager: [phone] Mobile: [phone] After hours: [phone].   02/15/2020, 7:45 PM

## 2020-02-15 NOTE — Progress Notes (Addendum)
eLink Physician-Brief Progress Note Patient Name: [name] DOB: 06-17-1946 MRN: [id number]   Date of Service  02/15/2020  HPI/Events of Note  Call from ED doc about [name] with  74 yr old male with hx of COPD, DM,CKD, HTN ( EF 55%  02/2018), BPH-cancer prostatectomy penile prosthesis, a fib on Xarelto  with CC: ongoing weakness, confusion for few days with decreased apetite. leg wound, stage 1 sacral decube ulcer.  1) AMS for some time now in AKI Cr 12, K > 7. Covid pending. [name] old left occipital infarct. Nephrology suggested to admit to ICU for Possible HD tonight, no HD cath placed yet. received hyperkalemia Rx. No EKG changes like tall T waves, afib normal rate.     eICU Interventions  - hand off done to NP Rahul Shearon Stalls who is on call,  ICU team will see him in ED .     Intervention Category Major Interventions: Acute renal failure - evaluation and management;Hypertension - evaluation and management Intermediate Interventions: Communication with other healthcare providers and/or family  Elmer Sow 02/15/2020, 7:59 PM   4;46  Patient is in the room.  PA notes reviewed.  Camera; On room air.VS stable. CRRT not started yet. Got central access.  Continue Nephrology care. Follow K post CRRT.

## 2020-02-15 NOTE — Procedures (Signed)
Central Venous Catheter Insertion Procedure Note  [name]  [id number]  30-May-1946  Date:02/15/20  Time:9:04 PM   Provider Performing:Burle Kwan Shearon Stalls   Procedure: Insertion of Non-tunneled Central Venous [phone]) with US guidance ([id number])   Indication(s) Hemodialysis  Consent Risks of the procedure as well as the alternatives and risks of each were explained to the patient and/or caregiver.  Consent for the procedure was obtained and is signed in the bedside chart  Anesthesia Topical only with 1% lidocaine   Timeout Verified patient identification, verified procedure, site/side was marked, verified correct patient position, special equipment/implants available, medications/allergies/relevant history reviewed, required imaging and test results available.  Sterile Technique Maximal sterile technique including full sterile barrier drape, hand hygiene, sterile gown, sterile gloves, mask, hair covering, sterile ultrasound probe cover (if used).  Procedure Description Area of catheter insertion was cleaned with chlorhexidine and draped in sterile fashion.  With real-time ultrasound guidance a HD catheter was placed into the left internal jugular vein. Nonpulsatile blood flow and easy flushing noted in all ports.  The catheter was sutured in place and sterile dressing applied.  Complications/Tolerance None; patient tolerated the procedure well. Chest X-ray is ordered to verify placement for internal jugular or subclavian cannulation.   Chest x-ray is not ordered for femoral cannulation.  EBL Minimal  Specimen(s) None  Montey Hora, [place] [facility] 02/15/2020, 9:04 PM

## 2020-02-15 NOTE — ED Triage Notes (Addendum)
Pt to ED via EMS from home c/o generalized weakness over the past couple days. Per daughter pt has been more confused and weak over the past 3 days. Pt speech at baseline. Pt currently receiving wound care on LLE-But not currently on ABX. Pt does c/o pain on backside- Last VS: 117/56, hr 60-70 irregular, cbg 126, rr 25, temp 97.6. #[id number]. No medications given by EMS.

## 2020-02-15 NOTE — Progress Notes (Signed)
[place] for Heparin Indication: atrial fibrillation  Allergies  Allergen Reactions  . Ciprofloxacin Other (See Comments)    All over weakness    Patient Measurements: Height: 6\' 1"  (185.4 cm) Weight: (!) 122.5 kg (270 lb) IBW/kg (Calculated) : 79.9 Heparin Dosing Weight: 106.7 kg  Vital Signs: Temp: 98.2 F (36.8 C) (07/28 1359) Temp Source: Oral (07/28 1359) BP: 119/62 (07/28 1830) Pulse Rate: 57 (07/28 1528)  Labs: Recent Labs    02/15/20 1430 02/15/20 1747 02/15/20 2011  HGB 11.0*  --  11.9*  HCT 35.4*  --  35.0*  PLT PLATELET CLUMPS NOTED ON SMEAR, UNABLE TO ESTIMATE  --   --   CREATININE  --  11.66*  --     Estimated Creatinine Clearance: 7.7 mL/min (A) (by C-G formula based on SCr of 11.66 mg/dL (H)).   Medical History: Past Medical History:  Diagnosis Date  . Adenoma 05/2008  . Alcoholism in recovery ([place])   . BPH (benign prostatic hyperplasia)   . Cancer [facility]    Prostate  . Chronic LBP    Hip & Back -- Sees Dr. Maia Petties @ [facility]  . Colon polyps   . Complex renal cyst 12/2010  . Controlled type 2 diabetes mellitus with neuropathy ([place]) 01/2007  . COPD (chronic obstructive pulmonary disease) ([place])   . Diabetes ([place])   . Diverticulitis of colon   . ED (erectile dysfunction)    s/p Penile prosthesis (09/2010)  . History of prostate cancer    Dr. Karsten Ro  . History of sick sinus syndrome    reduced BB dose for Bradycardia  . HLD (hyperlipidemia)   . Hypertension   . Impaired glucose tolerance 12/21/2013  . Nephrolithiasis   . Osteoarthritis of both hips   . Osteoarthritis of lumbosacral spine    with Disc Disease  . PAF (paroxysmal atrial fibrillation) ([facility])    No longer on Amiodarone.  Not on Anticoaguation b/c no recurrence.  . Paresthesias/numbness    Bilateral LE  . Peripheral neuropathy 12/21/2013    Medications:  Scheduled:  . insulin aspart  0-15 Units Subcutaneous Q4H  . sodium  zirconium cyclosilicate  10 g Oral Once    Assessment: Patient is a 21 yom that presents to the ED with c/o weakness and AMS. The patient is on xarelto for afib. Pharmacy has been asked to dose heparin at this time patient planned to start on CRRT tonight.   Last dose of Xarelto  was at ~ 2100 on 7/27 Goal of Therapy:  aPTT 66-102 seconds Monitor platelets by anticoagulation protocol: Yes   Plan:  - No Heparin bolus as on Xarelto PTA last dose was at 2100 on 7/27 - Start heparin drip @ 1600 units/hr on 7/28 at 2100  - Will monitor heparin using aPTT until aPTT and Heparin levels correlate - aPTT level in ~ 8 hours  - Monitor patient for s/s of bleeding and CBC while on heparin   Duanne Limerick PharmD. BCPS  02/15/2020,9:11 PM

## 2020-02-15 NOTE — Progress Notes (Addendum)
[name], [name] ([id number]) Visit Report for 02/15/2020 Chief Complaint Document Details Patient Name: Date of Service: [name] 02/15/2020 10:00 A M Medical Record Number: [id number] Patient Account Number: [id number] Date of Birth/Sex: Treating RN: 08-Feb-1946 (74 y.o. [name] Primary Care Provider: Cathlean [name] Other Clinician: Referring Provider: Treating Provider/Extender: [name] in Treatment: 3 Information Obtained from: Patient Chief Complaint Right ankle ulcer Electronic Signature(s) Signed: 02/15/2020 11:03:45 AM By: Parker Keeler PA-C Entered By: [name] on 02/15/2020 11:03:44 -------------------------------------------------------------------------------- HPI Details Patient Name: Date of Service: Parker Silos ND, [place] 02/15/2020 10:00 A M Medical Record Number: [id number] Patient Account Number: [id number] Date of Birth/Sex: Treating RN: Dec 05, 1945 (74 y.o. [name] Primary Care Provider: Cathlean [name] Other Clinician: Referring Provider: Treating Provider/Extender: [name] in Treatment: 3 History of Present Illness HPI Description: 10/19/2019 upon evaluation today patient appears to be doing somewhat poorly upon initial evaluation here in our clinic. He tells me that he has really an undisclosed amount of time that he has been dealing with issues with his legs bilaterally. The left leg swells minimally the right leg has wounds and unfortunately does seem to swell much more significantly. Fortunately there is no evidence of active infection at this time. No fevers, chills, nausea, vomiting, or diarrhea. With that being said the patient tells me that he does have a history of diabetes with his most recent hemoglobin A1c being 7.1 that was on 10/06/2019. He has been using Neosporin over the area. He also has what appears to be lymphedema stage III, chronic venous insufficiency,  diabetes mellitus type 2, hypertension, COPD, paroxysmal atrial fibrillation, and long-term use of anticoagulant therapy. The patient really does not know how long the wounds currently have been open although he tells me "months". 10/26/2019 on evaluation today patient appears to be doing excellent in regard to his right lower extremity. He just has a couple small areas remaining open at this point the majority of the wounds have all healed. Fortunately there is no signs of active infection. He does have a compression sock on the left lower extremity however the sock does have holes in it I think it is time for some new ones I think he would do well to get some from elastic therapy in [facility] as they will be much more inexpensive but at the same time excellent quality 11/02/2019 upon evaluation today patient appears to be doing very well with regard to his right lower extremity. Fortunately there is no evidence of active infection which is good news. Overall I think that he has done extremely well at this point. Readmission: 01/25/2020 upon evaluation today patient presents for reevaluation here in our clinic concerning initially what appeared to be a blister over his lower extremity that is the main reason he made the appointment. Unfortunately he ended up with a wound where his compression socks were bulging off right at the ankle area as well and that has been a bigger problem for him. He states that they really hurt him in that regard. He has never had Velcro compression socks although I think that may be something that would be beneficial for him. Fortunately there is no signs of active infection at this time. 02/08/2020 upon evaluation today patient appears to be doing well in general with regard to his wound. Overall I do believe that he is making good progress with this compression wrap currently and the  collagen does seem to be helping as well. Fortunately there is no evidence of  active infection at this time and I do see new skin growth at this point even compared to the last visit overall I feel like I would suggest continuing with the collagen at this time. 02/15/2020 upon evaluation today patient appears to present for follow-up concerning his wound which actually appears to be showing some signs of improvement that is good news. Nonetheless he unfortunately is still dealing with some of the edema which is an issue for him here. I think the compression wrap is doing a good job he does have a Velcro compression wrap to transition into once he heals. Electronic Signature(s) Signed: 02/15/2020 1:12:49 PM By: Parker Keeler PA-C Entered By: [name] on 02/15/2020 13:12:48 -------------------------------------------------------------------------------- Physical Exam Details Patient Name: Date of Service: [name] 02/15/2020 10:00 A M Medical Record Number: [id number] Patient Account Number: [id number] Date of Birth/Sex: Treating RN: 1946/07/09 (74 y.o. [name] Primary Care Provider: Cathlean [name] Other Clinician: Referring Provider: Treating Provider/Extender: [name] in Treatment: 3 Constitutional Well-nourished and well-hydrated in no acute distress. Respiratory normal breathing without difficulty. Psychiatric this patient is able to make decisions and demonstrates good insight into disease process. Alert and Oriented x 3. pleasant and cooperative. Notes Upon inspection patient's wound bed actually showed signs of minimal slough buildup he did have some dry skin that was removed post debridement which was just mechanical not sharp today he actually appears to be doing better the wound is very superficial looks healthy but still has not completely closed. Hopefully this will do Parker shortly. Electronic Signature(s) Signed: 02/15/2020 1:13:08 PM By: Parker Keeler PA-C Entered By: [name] on 02/15/2020  13:13:08 -------------------------------------------------------------------------------- Physician Orders Details Patient Name: Date of Service: Parker Silos ND, [place] 02/15/2020 10:00 A M Medical Record Number: [id number] Patient Account Number: [id number] Date of Birth/Sex: Treating RN: [birth year] (74 y.o. [name] Primary Care Provider: Cathlean [name] Other Clinician: Referring Provider: Treating Provider/Extender: [name] in Treatment: 3 Verbal / Phone Orders: No Diagnosis Coding ICD-10 Coding Code Description I87.2 Venous insufficiency (chronic) (peripheral) I89.0 Lymphedema, not elsewhere classified L97.312 Non-pressure chronic ulcer of right ankle with fat layer exposed E11.622 Type 2 diabetes mellitus with other skin ulcer I50.42 Chronic combined systolic (congestive) and diastolic (congestive) heart failure J44.9 Chronic obstructive pulmonary disease, unspecified I48.0 Paroxysmal atrial fibrillation Z79.01 Long term (current) use of anticoagulants Follow-up Appointments Return Appointment in 1 week. Dressing Change Frequency Wound #5 Right,Anterior Ankle Do not change entire dressing for one week. Skin Barriers/Peri-Wound Care Moisturizing lotion Wound Cleansing Wound #5 Right,Anterior Ankle May shower with protection. Primary Wound Dressing Wound #5 Right,Anterior Ankle Silver Collagen Secondary Dressing Wound #5 Right,Anterior Ankle Foam Dry Gauze Edema Control 4 layer compression - Right Lower Extremity Avoid standing for long periods of time Elevate legs to the level of the heart or above for 30 minutes daily and/or when sitting, a frequency of: Exercise regularly Electronic Signature(s) Signed: 02/15/2020 6:38:59 PM By: Baruch Gouty RN, BSN Signed: 02/15/2020 6:41:43 PM By: Parker Keeler PA-C Entered By: Baruch Gouty on 02/15/2020  12:03:55 -------------------------------------------------------------------------------- Problem List Details Patient Name: Date of Service: Parker Silos ND, [place] 02/15/2020 10:00 A M Medical Record Number: [id number] Patient Account Number: [id number] Date of Birth/Sex: Treating RN: 10-17-1945 (74 y.o. [name] Primary Care Provider: Cathlean [name] Other Clinician: Referring Provider: Treating  Provider/Extender: [name] in Treatment: 3 Active Problems ICD-10 Encounter Code Description Active Date MDM Diagnosis I87.2 Venous insufficiency (chronic) (peripheral) 01/25/2020 No Yes I89.0 Lymphedema, not elsewhere classified 01/25/2020 No Yes L97.312 Non-pressure chronic ulcer of right ankle with fat layer exposed 01/25/2020 No Yes E11.622 Type 2 diabetes mellitus with other skin ulcer 01/25/2020 No Yes I50.42 Chronic combined systolic (congestive) and diastolic (congestive) heart failure 01/25/2020 No Yes J44.9 Chronic obstructive pulmonary disease, unspecified 01/25/2020 No Yes I48.0 Paroxysmal atrial fibrillation 01/25/2020 No Yes Z79.01 Long term (current) use of anticoagulants 01/25/2020 No Yes Inactive Problems Resolved Problems Electronic Signature(s) Signed: 02/15/2020 11:03:38 AM By: Parker Keeler PA-C Entered By: [name] on 02/15/2020 11:03:38 -------------------------------------------------------------------------------- Progress Note Details Patient Name: Date of Service: [name] 02/15/2020 10:00 A M Medical Record Number: [id number] Patient Account Number: [id number] Date of Birth/Sex: Treating RN: 12-09-1945 (74 y.o. [name] Primary Care Provider: Cathlean [name] Other Clinician: Referring Provider: Treating Provider/Extender: [name] in Treatment: 3 Subjective Chief Complaint Information obtained from Patient Right ankle ulcer History of Present Illness (HPI) 10/19/2019 upon evaluation  today patient appears to be doing somewhat poorly upon initial evaluation here in our clinic. He tells me that he has really an undisclosed amount of time that he has been dealing with issues with his legs bilaterally. The left leg swells minimally the right leg has wounds and unfortunately does seem to swell much more significantly. Fortunately there is no evidence of active infection at this time. No fevers, chills, nausea, vomiting, or diarrhea. With that being said the patient tells me that he does have a history of diabetes with his most recent hemoglobin A1c being 7.1 that was on 10/06/2019. He has been using Neosporin over the area. He also has what appears to be lymphedema stage III, chronic venous insufficiency, diabetes mellitus type 2, hypertension, COPD, paroxysmal atrial fibrillation, and long-term use of anticoagulant therapy. The patient really does not know how long the wounds currently have been open although he tells me "months". 10/26/2019 on evaluation today patient appears to be doing excellent in regard to his right lower extremity. He just has a couple small areas remaining open at this point the majority of the wounds have all healed. Fortunately there is no signs of active infection. He does have a compression sock on the left lower extremity however the sock does have holes in it I think it is time for some new ones I think he would do well to get some from elastic therapy in [facility] as they will be much more inexpensive but at the same time excellent quality 11/02/2019 upon evaluation today patient appears to be doing very well with regard to his right lower extremity. Fortunately there is no evidence of active infection which is good news. Overall I think that he has done extremely well at this point. Readmission: 01/25/2020 upon evaluation today patient presents for reevaluation here in our clinic concerning initially what appeared to be a blister over his lower  extremity that is the main reason he made the appointment. Unfortunately he ended up with a wound where his compression socks were bulging off right at the ankle area as well and that has been a bigger problem for him. He states that they really hurt him in that regard. He has never had Velcro compression socks although I think that may be something that would be beneficial for him. Fortunately there  is no signs of active infection at this time. 02/08/2020 upon evaluation today patient appears to be doing well in general with regard to his wound. Overall I do believe that he is making good progress with this compression wrap currently and the collagen does seem to be helping as well. Fortunately there is no evidence of active infection at this time and I do see new skin growth at this point even compared to the last visit overall I feel like I would suggest continuing with the collagen at this time. 02/15/2020 upon evaluation today patient appears to present for follow-up concerning his wound which actually appears to be showing some signs of improvement that is good news. Nonetheless he unfortunately is still dealing with some of the edema which is an issue for him here. I think the compression wrap is doing a good job he does have a Velcro compression wrap to transition into once he heals. Objective Constitutional Well-nourished and well-hydrated in no acute distress. Vitals Time Taken: 11:14 AM, Height: 73 in, Weight: 275 lbs, BMI: 36.3, Temperature: 98.2 F, Pulse: 72 bpm, Respiratory Rate: 18 breaths/min, Blood Pressure: 108/55 mmHg. Respiratory normal breathing without difficulty. Psychiatric this patient is able to make decisions and demonstrates good insight into disease process. Alert and Oriented x 3. pleasant and cooperative. General Notes: Upon inspection patient's wound bed actually showed signs of minimal slough buildup he did have some dry skin that was removed post debridement  which was just mechanical not sharp today he actually appears to be doing better the wound is very superficial looks healthy but still has not completely closed. Hopefully this will do Parker shortly. Integumentary (Hair, Skin) Wound #5 status is Open. Original cause of wound was Gradually Appeared. The wound is located on the Right,Anterior Ankle. The wound measures 0.3cm length x 0.9cm width x 0.1cm depth; 0.212cm^2 area and 0.021cm^3 volume. There is Fat Layer (Subcutaneous Tissue) Exposed exposed. There is no tunneling or undermining noted. There is a small amount of serosanguineous drainage noted. The wound margin is flat and intact. There is medium (34-66%) pink granulation within the wound bed. There is a medium (34-66%) amount of necrotic tissue within the wound bed including Adherent Slough. 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 There was a Four Layer Compression Therapy Procedure by Baruch Gouty, RN. Post procedure Diagnosis Wound #5: Same as Pre-Procedure Plan Follow-up Appointments: Return Appointment in 1 week. Dressing Change Frequency: Wound #5 Right,Anterior Ankle: Do not change entire dressing for one week. Skin Barriers/Peri-Wound Care: Moisturizing lotion Wound Cleansing: Wound #5 Right,Anterior Ankle: May shower with protection. Primary Wound Dressing: Wound #5 Right,Anterior Ankle: Silver Collagen Secondary Dressing: Wound #5  Right,Anterior Ankle: Foam Dry Gauze Edema Control: 4 layer compression - Right Lower Extremity Avoid standing for long periods of time Elevate legs to the level of the heart or above for 30 minutes daily and/or when sitting, a frequency of: Exercise regularly 1. I would recommend currently that we going to continue with the wound care measures as before specifically with regard to the silver collagen dressing which I think has been beneficial  for him. 2. I am also going to suggest that we continue with a 4-layer compression wrap of the right lower extremity to keep edema under good control. 3. I would also recommend he needs to continue to elevate his legs as much as possible to try to keep the edema under control as well. We will see patient back for reevaluation in 1 week here in the clinic. If anything worsens or changes patient will contact our office for additional recommendations. Electronic Signature(s) Signed: 02/15/2020 1:14:00 PM By: Parker Keeler PA-C Entered By: [name] on 02/15/2020 13:14:00 -------------------------------------------------------------------------------- SuperBill Details Patient Name: Date of Service: [name] 02/15/2020 Medical Record Number: [id number] Patient Account Number: [id number] Date of Birth/Sex: Treating RN: 1945-11-06 (74 y.o. [name] Primary Care Provider: Cathlean [name] Other Clinician: Referring Provider: Treating Provider/Extender: [name] in Treatment: 3 Diagnosis Coding ICD-10 Codes Code Description I87.2 Venous insufficiency (chronic) (peripheral) I89.0 Lymphedema, not elsewhere classified L97.312 Non-pressure chronic ulcer of right ankle with fat layer exposed E11.622 Type 2 diabetes mellitus with other skin ulcer I50.42 Chronic combined systolic (congestive) and diastolic (congestive) heart failure J44.9 Chronic obstructive pulmonary disease, unspecified I48.0 Paroxysmal  atrial fibrillation Z79.01 Long term (current) use of anticoagulants Facility Procedures CPT4 Code: [id number] Description: (Facility Use Only) [phone] - APPLY MULTLAY COMPRS LWR RT LEG Modifier: Quantity: 1 Physician Procedures : CPT4 Code Description Modifier [id number] - WC PHYS LEVEL 3 - EST PT ICD-10 Diagnosis Description I87.2 Venous insufficiency (chronic) (peripheral) I89.0 Lymphedema, not elsewhere classified L97.312 Non-pressure chronic ulcer of right ankle with fat  layer exposed E11.622 Type 2 diabetes mellitus with other skin ulcer Quantity: 1 Electronic Signature(s) Signed: 02/15/2020 1:14:42 PM By: Parker Keeler PA-C Entered By: [name] on 02/15/2020 13:14:42

## 2020-02-16 ENCOUNTER — Ambulatory Visit: Payer: Medicare Other | Admitting: Internal Medicine

## 2020-02-16 ENCOUNTER — Inpatient Hospital Stay (HOSPITAL_COMMUNITY): Payer: Medicare Other

## 2020-02-16 DIAGNOSIS — L8990 Pressure ulcer of unspecified site, unspecified stage: Secondary | ICD-10-CM | POA: Insufficient documentation

## 2020-02-16 DIAGNOSIS — N179 Acute kidney failure, unspecified: Secondary | ICD-10-CM | POA: Diagnosis not present

## 2020-02-16 LAB — GLUCOSE, CAPILLARY
Glucose-Capillary: 76 mg/dL (ref 70–99)
Glucose-Capillary: 82 mg/dL (ref 70–99)
Glucose-Capillary: 107 mg/dL — ABNORMAL HIGH (ref 70–99)
Glucose-Capillary: 113 mg/dL — ABNORMAL HIGH (ref 70–99)
Glucose-Capillary: 87 mg/dL (ref 70–99)
Glucose-Capillary: 78 mg/dL (ref 70–99)

## 2020-02-16 LAB — RENAL FUNCTION PANEL
Albumin: 3.1 g/dL — ABNORMAL LOW (ref 3.5–5.0)
Anion gap: 13 (ref 5–15)
BUN: 100 mg/dL — ABNORMAL HIGH (ref 8–23)
CO2: 18 mmol/L — ABNORMAL LOW (ref 22–32)
Calcium: 9 mg/dL (ref 8.9–10.3)
Chloride: 107 mmol/L (ref 98–111)
Creatinine, Ser: 7.06 mg/dL — ABNORMAL HIGH (ref 0.61–1.24)
GFR calc Af Amer: 8 mL/min — ABNORMAL LOW (ref >60)
GFR calc non Af Amer: 7 mL/min — ABNORMAL LOW (ref >60)
Glucose, Bld: 90 mg/dL (ref 70–99)
Phosphorus: 4.1 mg/dL (ref 2.5–4.6)
Potassium: 4.6 mmol/L (ref 3.5–5.1)
Sodium: 138 mmol/L (ref 135–145)

## 2020-02-16 LAB — BASIC METABOLIC PANEL
Anion gap: 12 (ref 5–15)
Anion gap: 16 — ABNORMAL HIGH (ref 5–15)
BUN: 153 mg/dL — ABNORMAL HIGH (ref 8–23)
BUN: 95 mg/dL — ABNORMAL HIGH (ref 8–23)
CO2: 10 mmol/L — ABNORMAL LOW (ref 22–32)
CO2: 17 mmol/L — ABNORMAL LOW (ref 22–32)
Calcium: 9.1 mg/dL (ref 8.9–10.3)
Calcium: 8.5 mg/dL — ABNORMAL LOW (ref 8.9–10.3)
Chloride: 113 mmol/L — ABNORMAL HIGH (ref 98–111)
Chloride: 104 mmol/L (ref 98–111)
Creatinine, Ser: 10.42 mg/dL — ABNORMAL HIGH (ref 0.61–1.24)
Creatinine, Ser: 6.87 mg/dL — ABNORMAL HIGH (ref 0.61–1.24)
GFR calc Af Amer: 5 mL/min — ABNORMAL LOW (ref >60)
GFR calc Af Amer: 8 mL/min — ABNORMAL LOW (ref >60)
GFR calc non Af Amer: 4 mL/min — ABNORMAL LOW (ref >60)
GFR calc non Af Amer: 7 mL/min — ABNORMAL LOW (ref >60)
Glucose, Bld: 127 mg/dL — ABNORMAL HIGH (ref 70–99)
Glucose, Bld: 125 mg/dL — ABNORMAL HIGH (ref 70–99)
Potassium: 6.5 mmol/L (ref 3.5–5.1)
Potassium: 4.6 mmol/L (ref 3.5–5.1)
Sodium: 135 mmol/L (ref 135–145)
Sodium: 137 mmol/L (ref 135–145)

## 2020-02-16 LAB — CBC
HCT: 32.3 % — ABNORMAL LOW (ref 39.0–52.0)
Hemoglobin: 10.5 g/dL — ABNORMAL LOW (ref 13.0–17.0)
MCH: 27.7 pg (ref 26.0–34.0)
MCHC: 32.5 g/dL (ref 30.0–36.0)
MCV: 85.2 fL (ref 80.0–100.0)
Platelets: 124 10*3/uL — ABNORMAL LOW (ref 150–400)
RBC: 3.79 MIL/uL — ABNORMAL LOW (ref 4.22–5.81)
RDW: 14.7 % (ref 11.5–15.5)
WBC: 4.8 10*3/uL (ref 4.0–10.5)
nRBC: 0.4 % — ABNORMAL HIGH (ref 0.0–0.2)

## 2020-02-16 LAB — PHOSPHORUS: Phosphorus: 5.8 mg/dL — ABNORMAL HIGH (ref 2.5–4.6)

## 2020-02-16 LAB — HEPATITIS B SURFACE ANTIGEN: Hepatitis B Surface Ag: NONREACTIVE

## 2020-02-16 LAB — MRSA PCR SCREENING: MRSA by PCR: NEGATIVE

## 2020-02-16 LAB — HEPARIN LEVEL (UNFRACTIONATED): Heparin Unfractionated: 0.52 IU/mL (ref 0.30–0.70)

## 2020-02-16 LAB — MAGNESIUM: Magnesium: 2.4 mg/dL (ref 1.7–2.4)

## 2020-02-16 LAB — APTT: aPTT: 137 seconds — ABNORMAL HIGH (ref 24–36)

## 2020-02-16 MED ORDER — NEPRO/CARBSTEADY PO LIQD
237.0000 mL | Freq: Two times a day (BID) | ORAL | Status: DC
  Administered 2020-02-16 – 2020-02-20 (×4): 237 mL via ORAL

## 2020-02-16 MED ORDER — SODIUM CHLORIDE 0.9% FLUSH
10.0000 mL | Freq: Two times a day (BID) | INTRAVENOUS | Status: DC
  Administered 2020-02-16 – 2020-02-21 (×3): 10 mL

## 2020-02-16 MED ORDER — RENA-VITE PO TABS
1.0000 | ORAL_TABLET | Freq: Every day | ORAL | Status: DC
  Administered 2020-02-17 – 2020-02-22 (×6): 1 via ORAL
  Filled 2020-02-16 (×7): qty 1

## 2020-02-16 MED ORDER — PROSOURCE PLUS PO LIQD
30.0000 mL | Freq: Two times a day (BID) | ORAL | Status: DC
  Administered 2020-02-16 – 2020-02-20 (×4): 30 mL via ORAL
  Filled 2020-02-16 (×7): qty 30

## 2020-02-16 MED ORDER — RIVAROXABAN 20 MG PO TABS: 20.0000 mg | ORAL_TABLET | Freq: Every day | ORAL | Status: DC

## 2020-02-16 MED ORDER — PRAVASTATIN SODIUM 40 MG PO TABS
40.0000 mg | ORAL_TABLET | Freq: Every day | ORAL | Status: DC
  Administered 2020-02-16 – 2020-02-23 (×8): 40 mg via ORAL
  Filled 2020-02-16 (×9): qty 1

## 2020-02-16 MED ORDER — SODIUM CHLORIDE 0.9% FLUSH: 10.0000 mL | INTRAVENOUS | Status: DC | PRN

## 2020-02-16 MED ORDER — HEPARIN SODIUM (PORCINE) 1000 UNIT/ML IJ SOLN
INTRAMUSCULAR | Status: AC
  Administered 2020-02-16: 2800 [IU]
  Filled 2020-02-16: qty 3

## 2020-02-16 MED ORDER — LIP MEDEX EX OINT
TOPICAL_OINTMENT | CUTANEOUS | Status: DC | PRN
  Filled 2020-02-16: qty 7

## 2020-02-16 MED ORDER — INSULIN ASPART 100 UNIT/ML ~~LOC~~ SOLN: 0.0000 [IU] | Freq: Every day | SUBCUTANEOUS | Status: DC

## 2020-02-16 MED ORDER — INSULIN ASPART 100 UNIT/ML ~~LOC~~ SOLN: 0.0000 [IU] | Freq: Three times a day (TID) | SUBCUTANEOUS | Status: DC

## 2020-02-16 MED ORDER — APIXABAN 5 MG PO TABS
5.0000 mg | ORAL_TABLET | Freq: Two times a day (BID) | ORAL | Status: DC
  Administered 2020-02-16 – 2020-02-19 (×6): 5 mg via ORAL
  Filled 2020-02-16 (×8): qty 1

## 2020-02-16 MED ORDER — MUSCLE RUB 10-15 % EX CREA
TOPICAL_CREAM | CUTANEOUS | Status: DC | PRN
  Filled 2020-02-16: qty 85

## 2020-02-16 NOTE — Progress Notes (Signed)
[place] for Heparin Indication: atrial fibrillation  Allergies  Allergen Reactions  . Ciprofloxacin Other (See Comments)    All over weakness    Patient Measurements: Height: 6\' 1"  (185.4 cm) Weight:  (Bed scale broken ) IBW/kg (Calculated) : 79.9 Heparin Dosing Weight: 106.7 kg  Vital Signs: Temp: 97.9 F (36.6 C) (07/29 0426) Temp Source: Oral (07/29 0426) BP: 143/61 (07/29 0511) Pulse Rate: 66 (07/29 0511)  Labs: Recent Labs    02/15/20 1430 02/15/20 1747 02/15/20 2011 02/15/20 2230 02/16/20 0109 02/16/20 0441  HGB 11.0*   < > 11.9*  --   --  10.5*  HCT 35.4*  --  35.0*  --   --  32.3*  PLT PLATELET CLUMPS NOTED ON SMEAR, UNABLE TO ESTIMATE  --   --   --   --  124*  APTT  --   --   --   --   --  137*  HEPARINUNFRC  --   --   --   --   --  0.52  CREATININE  --    < >  --  10.81* 10.42* 7.06*  CKTOTAL  --   --   --  230  --   --    < > = values in this interval not displayed.    Estimated Creatinine Clearance: 12.8 mL/min (A) (by C-G formula based on SCr of 7.06 mg/dL (H)).   Medical History: Past Medical History:  Diagnosis Date  . Adenoma 05/2008  . Alcoholism in recovery ([place])   . BPH (benign prostatic hyperplasia)   . Cancer [facility])    Prostate  . Chronic LBP    Hip & Back -- Sees Dr. Maia Petties @ [place]  . Colon polyps   . Complex renal cyst 12/2010  . Controlled type 2 diabetes mellitus with neuropathy ([place]) 01/2007  . COPD (chronic obstructive pulmonary disease) ([place])   . Diabetes ([place])   . Diverticulitis of colon   . ED (erectile dysfunction)    s/p Penile prosthesis (09/2010)  . History of prostate cancer    Dr. Karsten Ro  . History of sick sinus syndrome    reduced BB dose for Bradycardia  . HLD (hyperlipidemia)   . Hypertension   . Impaired glucose tolerance 12/21/2013  . Nephrolithiasis   . Osteoarthritis of both hips   . Osteoarthritis of lumbosacral spine    with Disc Disease  . PAF  (paroxysmal atrial fibrillation) ([facility])    No longer on Amiodarone.  Not on Anticoaguation b/c no recurrence.  . Paresthesias/numbness    Bilateral LE  . Peripheral neuropathy 12/21/2013    Medications:  Scheduled:  . Chlorhexidine Gluconate Cloth  6 each Topical Q0600  . insulin aspart  0-15 Units Subcutaneous Q4H  . sodium chloride flush  10-40 mL Intracatheter Q12H    Assessment: Patient is a 49 yom that presents to the ED with c/o weakness and AMS. The patient is on xarelto for afib. Pharmacy has been asked to dose heparin at this time patient planned to start on CRRT tonight.   7/29 AM update:  Heparin level therapeutic at 0.52  Goal of Therapy:  Heparin level 0.3-0.7 units/mL Monitor platelets by anticoagulation protocol: Yes   Plan:  -Cont heparin at 1600 units/hr -1200 heparin level  Narda Bonds, PharmD, BCPS Clinical Pharmacist Phone: [phone]

## 2020-02-16 NOTE — Progress Notes (Signed)
[place] for Heparin to Apixaban Indication: atrial fibrillation  Allergies  Allergen Reactions  . Ciprofloxacin Other (See Comments)    All over weakness    Patient Measurements: Height: 6\' 1"  (185.4 cm) Weight:  (Bed scale broken ) IBW/kg (Calculated) : 79.9 Heparin Dosing Weight: 106.7 kg  Vital Signs: Temp: 97.8 F (36.6 C) (07/29 0844) Temp Source: Oral (07/29 0844) BP: 122/57 (07/29 0900) Pulse Rate: 47 (07/29 0900)  Labs: Recent Labs    02/15/20 1430 02/15/20 1747 02/15/20 2011 02/15/20 2230 02/16/20 0109 02/16/20 0441  HGB 11.0*   < > 11.9*  --   --  10.5*  HCT 35.4*  --  35.0*  --   --  32.3*  PLT PLATELET CLUMPS NOTED ON SMEAR, UNABLE TO ESTIMATE  --   --   --   --  124*  APTT  --   --   --   --   --  137*  HEPARINUNFRC  --   --   --   --   --  0.52  CREATININE  --    < >  --  10.81* 10.42* 7.06*  CKTOTAL  --   --   --  230  --   --    < > = values in this interval not displayed.    Estimated Creatinine Clearance: 12.8 mL/min (A) (by C-G formula based on SCr of 7.06 mg/dL (H)).   Medical History: Past Medical History:  Diagnosis Date  . Adenoma 05/2008  . Alcoholism in recovery ([place])   . BPH (benign prostatic hyperplasia)   . Cancer [facility])    Prostate  . Chronic LBP    Hip & Back -- Sees Dr. Maia Petties @ [place]  . Colon polyps   . Complex renal cyst 12/2010  . Controlled type 2 diabetes mellitus with neuropathy ([place]) 01/2007  . COPD (chronic obstructive pulmonary disease) ([place])   . Diabetes ([place])   . Diverticulitis of colon   . ED (erectile dysfunction)    s/p Penile prosthesis (09/2010)  . History of prostate cancer    Dr. Karsten Ro  . History of sick sinus syndrome    reduced BB dose for Bradycardia  . HLD (hyperlipidemia)   . Hypertension   . Impaired glucose tolerance 12/21/2013  . Nephrolithiasis   . Osteoarthritis of both hips   . Osteoarthritis of lumbosacral spine    with Disc  Disease  . PAF (paroxysmal atrial fibrillation) ([facility])    No longer on Amiodarone.  Not on Anticoaguation b/c no recurrence.  . Paresthesias/numbness    Bilateral LE  . Peripheral neuropathy 12/21/2013    Medications:  Scheduled:  . apixaban  5 mg Oral BID  . Chlorhexidine Gluconate Cloth  6 each Topical Q0600  . insulin aspart  0-15 Units Subcutaneous Q4H  . pravastatin  40 mg Oral Daily  . sodium chloride flush  10-40 mL Intracatheter Q12H    Assessment: Patient is a 44 yom that presents to the ED with c/o weakness and AMS. The patient is on xarelto for afib. Pharmacy has been asked to dose heparin at this time  Pharmacy reconsulted to switch to a DOAC.  Patient was on Xarelto PTA, however, with his decline in renal function would recommend switching to apixaban.  Goal of Therapy:  Heparin level 0.3-0.7 units/mL Monitor platelets by anticoagulation protocol: Yes   Plan:  -Start apixaban 5 mg po bid Stop heparin drip Monitor for  signs and symptoms of bleeding  Alanda Slim, PharmD, [facility] Clinical Pharmacist Please see AMION for all Pharmacists' Contact Phone Numbers 02/16/2020, 10:55 AM

## 2020-02-16 NOTE — Consult Note (Addendum)
[facility] Nurse Consult Note: Reason for Consult: Pt states he is followed by the outpatient wound care center for a chronic wound to RLE.  They applied a compression wrap to RLE which he says has been very tight and painful since application 2 days ago.  Removed 4 layer compression wrap to assess RLE.  Pt has a darker-colored deep tissue pressure injury to right anterior calf, located over the bone; 5X.5cm. There are other areas of pink dry scar tissue to the anterior calf from previous wounds which have healed.  Right anterior foot with chronic healing full thickness wound; .3X.3X.2cm, 100% red and moist, no odor or drainage. Dressing procedure/placement/frequency: Will not re-apply multiple layer compression wraps at this time related to the DTPI over the calf. Topical treatment orders provided for bedside nurses to perform as follows to protect and promote healing and provide light compression: Foam dressing to right anterior foot and anterior calf, change Q Thurs/Sat/Mon or PRN soiling.  Apply Ace wrap over foam dressings in a spiral fashion, beginning just behind toes to below knee.  Pt should follow-up with the outpatient wound care center after discharge.  Please re-consult if further assistance is needed.  Thank-you,  Julien Girt MSN, [place], [place], [place], [place]

## 2020-02-16 NOTE — Progress Notes (Signed)
Initial Nutrition Assessment  DOCUMENTATION CODES:   Not applicable  INTERVENTION:   - Recommend liberalizing diet to Regular diet  - Nepro Shake po BID, each supplement provides 425 kcal and 19 grams protein  - ProSource Plus po BID, each supplement provides 100 kcal and 15 grams protein  - Renal MVI daily  NUTRITION DIAGNOSIS:   Increased nutrient needs related to chronic illness (CHF, COPD) as evidenced by estimated needs.  GOAL:   Patient will meet greater than or equal to 90% of their needs  MONITOR:   PO intake, Supplement acceptance, Labs, Weight trends, Skin, I & O's  REASON FOR ASSESSMENT:   Malnutrition Screening Tool    ASSESSMENT:   74 year old male who presented on 7/28 with weakness and AMS. PMH of COPD, DM, atrial fibrillation, HTN, HLD, HFpEF, BPH and prostate cancer s/p prostatectomy, remote nephrolithiasis. Admitted with significant AKI, metabolic acidosis, hyperkalemia.   7/28 - first iHD  Pt unable to tolerate UF with HD. Noted plan for second HD treatment today with no UF.  Spoke with pt at bedside. Daughters entered room towards end of RD conversation. Noted virtually untouched breakfast meal tray at bedside (possibly a few bites gone).  Pt reports poor PO intake and decreased appetite over the last 3-4 days. Pt reports that his last "square meal" was 4 days ago. Pt reports that he typically just eats when he is hungry and doesn't follow a set eating schedule. Pt typically eats fast food. Pt reports that his lack of appetite is concerning to him.  Pt reports that food doesn't taste good anymore. He denies food having a metallic taste.  Pt reports UBW as 240 lbs but states that recently he has gained weight and believes his most recent weight was 270-280 lbs. He attributes this weight gain to eating more fast food and eating less "nutritious" food.  Pt willing to try oral nutrition supplements. RD to order. Discussed importance of PO intake in  maintaining lean muscle mass and promoting wound healing.  Reviewed weight history in chart. Pt with a 4.1 kg weight loss since 10/06/19. This is a 3.2% weight loss which is not significant for timeframe but is concerning given pt's report of lack of appetite. Pt at risk for malnutrition.  Medications reviewed and include: SSI q 4 hours, heparin, sodium bicarb in D5 @ 100 ml/hr  Labs reviewed. K+ and phos now WNL. CBG's: 62-97 x 24 hours  NUTRITION - FOCUSED PHYSICAL EXAM:    Most Recent Value  Orbital Region Moderate depletion  Upper Arm Region No depletion  Thoracic and Lumbar Region No depletion  Buccal Region Mild depletion  Temple Region Mild depletion  Clavicle Bone Region Mild depletion  Clavicle and Acromion Bone Region Mild depletion  Scapular Bone Region No depletion  Dorsal Hand No depletion  Patellar Region No depletion  Anterior Thigh Region Mild depletion  Posterior Calf Region No depletion  Edema (RD Assessment) Mild  [BLE]  Hair Reviewed  Eyes Reviewed  Mouth Reviewed  Skin Reviewed  Nails Reviewed       Diet Order:   Diet Order            Diet renal with fluid restriction Fluid restriction: 1500 mL Fluid; Room service appropriate? Yes; Fluid consistency: Thin  Diet effective now                 EDUCATION NEEDS:   Education needs have been addressed  Skin:  Skin Assessment: Skin Integrity Issues: DTI:  right leg Stage II: sacrum  Last BM:  02/16/20  Height:   Ht Readings from Last 1 Encounters:  02/15/20 6\' 1"  (1.854 m)    Weight:   Wt Readings from Last 1 Encounters:  02/16/20 (!) 122.5 kg    Ideal Body Weight:  83.6 kg  BMI:  Body mass index is 35.63 kg/m.  Estimated Nutritional Needs:   Kcal:  2200-2400  Protein:  110-130 grams  Fluid:  1000 ml + UOP    Gaynell Face, MS, RD, LDN Inpatient Clinical Dietitian Please see AMiON for contact information.

## 2020-02-16 NOTE — Progress Notes (Signed)
Pt transferred to 5M12. Family notified of transfer. Pts cell phone, charger, holder, clothing and medications sent with patient.

## 2020-02-16 NOTE — Progress Notes (Signed)
Admit: 02/15/2020 LOS: 1  74M severe renal failure, hyperkalemia, acidosis  Subjective:  . HD overnight, K normalized  . HCO3 up to 18 . Not much UOP 0.2L . BPs stable, not on pressors . Remains on NaHCO3 gtt . Renal US w/o obstruction or acute structural issues . UA with pyuria, bacteruria, small hematuria, 1+ protein . Daughters at bedside, updated  07/28 0701 - 07/29 0700 In: 1040.1 [I.V.:990.1; IV Piggyback:50] Out: -357 [Urine:200]  Filed Weights   02/15/20 1357 02/16/20 0054  Weight: (!) 122.5 kg (!) 122.5 kg    Scheduled Meds: . apixaban  5 mg Oral BID  . Chlorhexidine Gluconate Cloth  6 each Topical Q0600  . feeding supplement (NEPRO CARB STEADY)  237 mL Oral BID BM  . insulin aspart  0-15 Units Subcutaneous Q4H  . multivitamin  1 tablet Oral QHS  . pravastatin  40 mg Oral Daily  . sodium chloride flush  10-40 mL Intracatheter Q12H   Continuous Infusions: .  sodium bicarbonate  infusion 1000 mL 100 mL/hr at 02/16/20 0400   PRN Meds:.docusate sodium, heparin, lip balm, polyethylene glycol, sodium chloride flush  Current Labs: reviewed    Physical Exam:  Blood pressure (!) 122/57, pulse 47, temperature 97.8 F (36.6 C), temperature source Oral, resp. rate 15, height 6\' 1"  (1.854 m), weight (!) 122.5 kg, SpO2 93 %. Ill appaering RLE bandaged LLE trace edema RRR ctab Coarse bs b/l S/nt/nd Nonfocal  A 1. AKI, suspect subacute decline, severe and started HD 7/28 for acidosis, azotemia, and hyperkalemia.  Maybe component of CKD progression + ARB + NSAIDs. Imaging negative 2. Hyperkalemia, severe, resolved with HD overnight 3. Metabolic Acidosis from #1, improved, remains on NaHCO3 gtt 4. Pyuria 5. Anemia, mild stable 6. DM2 7. RLE leg wound seeing wound care 8. HfPEF 9. Hx/o AFib 10. Hx/o BPH and prostatic Ca s/p prostacectomy  P . HD again today, no UF . Trend UOP . Check U Cx . Check SPEP and SFLC . Stop NaHCO3 today when starting HD . Medication  Issues; o Preferred narcotic agents for pain control are hydromorphone, fentanyl, and methadone. Morphine should not be used.  o Baclofen should be avoided o Avoid oral sodium phosphate and magnesium citrate based laxatives / bowel preps    Pearson Grippe MD 02/16/2020, 12:00 PM  Recent Labs  Lab 02/15/20 2230 02/16/20 0109 02/16/20 0441  NA 134* 135 138  K 6.6* 6.5* 4.6  CL 111 113* 107  CO2 9* 10* 18*  GLUCOSE 72 127* 90  BUN 162* 153* 100*  CREATININE 10.81* 10.42* 7.06*  CALCIUM 9.5 9.1 9.0  PHOS 5.9* 5.8* 4.1   Recent Labs  Lab 02/15/20 1430 02/15/20 2011 02/16/20 0441  WBC 6.2  --  4.8  NEUTROABS 4.9  --   --   HGB 11.0* 11.9* 10.5*  HCT 35.4* 35.0* 32.3*  MCV 90.5  --  85.2  PLT PLATELET CLUMPS NOTED ON SMEAR, UNABLE TO ESTIMATE  --  124*

## 2020-02-16 NOTE — Progress Notes (Signed)
NAMEKent [name], MRN:  [id number], DOB:  [birth year], LOS: 1 ADMISSION DATE:  02/15/2020, CONSULTATION DATE:  02/15/20 REFERRING MD:  Stark Jock  CHIEF COMPLAINT:  AMS   Brief History   [name] is a 74 y.o. male who was admitted 7/28 with significant AKI, metabolic acidosis, hyperkalemia.  History of present illness   [name] is a 74 y.o. male who has a PMH including but not limited to DM and hypertension.  (see "past medical history" for rest).  He presented to [facility] ED 7/28 with generalized weakness for a few days along with AMS.  He was found to have multiple metabolic derangements including significant AKI with SCr 11.66 (baseline around 1), BUN 168, CO2 9, K > 7.5, Na 133.  Creatinine was 2.2 in March 202, dialysis initiated last night with improvement in labs this morning with BUN 100, CO2 18, k 4.8, and Cr. 7.06.    Past Medical History  DM type II w/ neuropathy, non insulin dependent HLD target LDL <100 Anxeity Erectile dysfunction nondepended alcohol abuse Hypertensive heart disease w/chronic diastolic cong heart failure COPD Prostate cancer Hx of nephrolithiasis A. Fib Chronic kidney disease stage 3 Significant Hospital Events   7/28 > admit.  Consults:  Nephrology.  Procedures:  L HD line insertion.  Significant Diagnostic Tests:  CT head 7/28 > negative for acute process.  Micro Data:  COVID 7/28 > neg.  Antimicrobials:  None.   Interim history/subjective:  Patient has improved AMS today, two daughters at bedside feel mental status is significantly improved as well.   Objective:  Blood pressure (!) 122/59, pulse (!) 108, temperature 97.8 F (36.6 C), temperature source Oral, resp. rate 12, height 6\' 1"  (1.854 m), weight (!) 122.5 kg, SpO2 90 %.        Intake/Output Summary (Last 24 hours) at 02/16/2020 0858 Last data filed at 02/16/2020 0700 Gross per 24 hour  Intake 1040.11 ml  Output -357 ml  Net 1397.11 ml   Filed  Weights   02/15/20 1357 02/16/20 0054  Weight: (!) 122.5 kg (!) 122.5 kg    Examination: General: pleasant mand appears stated age, in NAD. Neuro: alert and oriented but response slow and tangential at times. No focal motor or sensory deficits.  HEENT: [place]/AT. Sclerae anicteric.   Cardiovascular: Normal HS, no M/R/G.  Lungs: Respirations even and unlabored.  CTA bilaterally, On room air Abdomen: BS x 4, soft, NT/ND.  Musculoskeletal: lower extremity edema with R>L, right lower extremity wrapped.  Skin: Intact, warm, no rashes.  Assessment & Plan:   Critically ill due to significant AKI with metabolic acidosis and hyperkalemia - s/p temporizing measures in ED and hemodialysis yesterday.  - labs improved with Cr now 7.5<11.66 - Nephrology following, second hemodialysis treatment today  - Frequent BMP's q8hrs.  Chronic renal insufficiency. Unclear how much recovery there may be long term.  - US kidneys showing bilateral renal cysts and evidence of chronic renal disease, bladder non-distended  - hold ACEi, diuretics.  Acute encephalopathy - presumed 2/2 uremia in setting above.  CT head negative. - Supportive care as above.  Hx PAF (on xarelto), HTN, HLD. -Continue home pravastatin -d/c heparin gtt, start home xarelto -Hold home amlodipine, benazepril, furosemide, toprol-xl, spironolactone. -PRN labetalol with SBP goal <160  Hx DM. - SSI. - Hold home metformin.  Hx COPD. - breathing comfortably on room air   Hx LLE wound - receiving hyperbaric O2 therapy as outpatient. - would care consulted   Best  Practice:  Diet: regular diet  Pain/Anxiety/Delirium protocol (if indicated): N/A. VAP protocol (if indicated): N/A. DVT prophylaxis: SCD's / Heparin gtt. GI prophylaxis: Continue home Prilosec  Glucose control: SSI. Mobility: Bedrest. Code Status: Full. Family Communication: Daughters at bedside Disposition:   Labs   CBC: Recent Labs  Lab 02/15/20 1430  02/15/20 2011 02/16/20 0441  WBC 6.2  --  4.8  NEUTROABS 4.9  --   --   HGB 11.0* 11.9* 10.5*  HCT 35.4* 35.0* 32.3*  MCV 90.5  --  85.2  PLT PLATELET CLUMPS NOTED ON SMEAR, UNABLE TO ESTIMATE  --  195*   Basic Metabolic Panel: Recent Labs  Lab 02/15/20 1747 02/15/20 2011 02/15/20 2230 02/16/20 0109 02/16/20 0441  NA 133* 140 134* 135 138  K >7.5* 6.3* 6.6* 6.5* 4.6  CL 111  --  111 113* 107  CO2 9*  --  9* 10* 18*  GLUCOSE 88  --  72 127* 90  BUN 168*  --  162* 153* 100*  CREATININE 11.66*  --  10.81* 10.42* 7.06*  CALCIUM 9.2  --  9.5 9.1 9.0  MG  --   --   --  2.4  --   PHOS  --   --  5.9* 5.8* 4.1   GFR: Estimated Creatinine Clearance: 12.8 mL/min (A) (by C-G formula based on SCr of 7.06 mg/dL (H)). Recent Labs  Lab 02/15/20 1430 02/16/20 0441  WBC 6.2 4.8   Liver Function Tests: Recent Labs  Lab 02/15/20 1747 02/16/20 0441  AST 13*  --   ALT 9  --   ALKPHOS 64  --   BILITOT 0.6  --   PROT 7.7  --   ALBUMIN 3.3* 3.1*   No results for input(s): LIPASE, AMYLASE in the last 168 hours. No results for input(s): AMMONIA in the last 168 hours. ABG    Component Value Date/Time   PHART 7.152 (LL) 02/15/2020 2011   PCO2ART 20.1 (L) 02/15/2020 2011   PO2ART 93 02/15/2020 2011   HCO3 7.1 (L) 02/15/2020 2011   TCO2 8 (L) 02/15/2020 2011   ACIDBASEDEF 20.0 (H) 02/15/2020 2011   O2SAT 95.0 02/15/2020 2011    Coagulation Profile: No results for input(s): INR, PROTIME in the last 168 hours. Cardiac Enzymes: Recent Labs  Lab 02/15/20 2230  CKTOTAL 230   HbA1C: Hgb A1c MFr Bld  Date/Time Value Ref Range Status  02/15/2020 10:50 PM 6.2 (H) 4.8 - 5.6 % Final    Comment:    (NOTE) Pre diabetes:          5.7%-6.4%  Diabetes:              >6.4%  Glycemic control for   <7.0% adults with diabetes   10/06/2019 04:36 PM 7.1 (H) 4.6 - 6.5 % Final    Comment:    Glycemic Control Guidelines for People with Diabetes:Non Diabetic:  <6%Goal of Therapy:  <7%Additional Action Suggested:  >8%    CBG: Recent Labs  Lab 02/15/20 1406 02/15/20 2233 02/16/20 0719  GLUCAP [address] [place], [place]

## 2020-02-16 NOTE — ED Notes (Signed)
Pt's daughter would like updates as available, especially regarding bed placement- phone number in chart.

## 2020-02-16 NOTE — Progress Notes (Signed)
Telephone call to Dr. Johnney Ou on-call Nephrologist regarding CRRT- Per Dr. Johnney Ou CRRT not required at this time patient is scheduled with the Hemodialysis unit again on 02/16/20 for the 2nd HD treatment; however, HD unit to call Nephrologist prior to 2nd HD treatment. [facility] nurse Nira Conn RN informed of this clarification not to perform CRRT currently

## 2020-02-17 ENCOUNTER — Inpatient Hospital Stay (HOSPITAL_COMMUNITY): Payer: Medicare Other

## 2020-02-17 DIAGNOSIS — R9431 Abnormal electrocardiogram [ECG] [EKG]: Secondary | ICD-10-CM

## 2020-02-17 HISTORY — PX: TRANSTHORACIC ECHOCARDIOGRAM: SHX275

## 2020-02-17 LAB — KAPPA/LAMBDA LIGHT CHAINS
Kappa free light chain: 76.2 mg/L — ABNORMAL HIGH (ref 3.3–19.4)
Kappa, lambda light chain ratio: 0.23 — ABNORMAL LOW (ref 0.26–1.65)
Lambda free light chains: 335.3 mg/L — ABNORMAL HIGH (ref 5.7–26.3)

## 2020-02-17 LAB — RENAL FUNCTION PANEL
Albumin: 2.7 g/dL — ABNORMAL LOW (ref 3.5–5.0)
Anion gap: 9 (ref 5–15)
BUN: 52 mg/dL — ABNORMAL HIGH (ref 8–23)
CO2: 26 mmol/L (ref 22–32)
Calcium: 8.4 mg/dL — ABNORMAL LOW (ref 8.9–10.3)
Chloride: 103 mmol/L (ref 98–111)
Creatinine, Ser: 4.2 mg/dL — ABNORMAL HIGH (ref 0.61–1.24)
GFR calc Af Amer: 15 mL/min — ABNORMAL LOW (ref >60)
GFR calc non Af Amer: 13 mL/min — ABNORMAL LOW (ref >60)
Glucose, Bld: 98 mg/dL (ref 70–99)
Phosphorus: 3.3 mg/dL (ref 2.5–4.6)
Potassium: 4.2 mmol/L (ref 3.5–5.1)
Sodium: 138 mmol/L (ref 135–145)

## 2020-02-17 LAB — CBC
HCT: 29.8 % — ABNORMAL LOW (ref 39.0–52.0)
Hemoglobin: 9.8 g/dL — ABNORMAL LOW (ref 13.0–17.0)
MCH: 27.8 pg (ref 26.0–34.0)
MCHC: 32.9 g/dL (ref 30.0–36.0)
MCV: 84.4 fL (ref 80.0–100.0)
Platelets: DECREASED 10*3/uL (ref 150–400)
RBC: 3.53 MIL/uL — ABNORMAL LOW (ref 4.22–5.81)
RDW: 14.5 % (ref 11.5–15.5)
WBC: 4.7 10*3/uL (ref 4.0–10.5)
nRBC: 0.4 % — ABNORMAL HIGH (ref 0.0–0.2)

## 2020-02-17 LAB — BASIC METABOLIC PANEL
Anion gap: 5 (ref 5–15)
BUN: 52 mg/dL — ABNORMAL HIGH (ref 8–23)
CO2: 27 mmol/L (ref 22–32)
Calcium: 8.4 mg/dL — ABNORMAL LOW (ref 8.9–10.3)
Chloride: 105 mmol/L (ref 98–111)
Creatinine, Ser: 4.32 mg/dL — ABNORMAL HIGH (ref 0.61–1.24)
GFR calc Af Amer: 15 mL/min — ABNORMAL LOW (ref >60)
GFR calc non Af Amer: 13 mL/min — ABNORMAL LOW (ref >60)
Glucose, Bld: 126 mg/dL — ABNORMAL HIGH (ref 70–99)
Potassium: 3.8 mmol/L (ref 3.5–5.1)
Sodium: 137 mmol/L (ref 135–145)

## 2020-02-17 LAB — ECHOCARDIOGRAM COMPLETE
Area-P 1/2: 3.38 cm2
Height: 73 in
S' Lateral: 3.2 cm
Weight: 4321.02 oz

## 2020-02-17 LAB — PROTEIN ELECTROPHORESIS, SERUM
A/G Ratio: 0.9 (ref 0.7–1.7)
Albumin ELP: 2.9 g/dL (ref 2.9–4.4)
Alpha-1-Globulin: 0.2 g/dL (ref 0.0–0.4)
Alpha-2-Globulin: 0.5 g/dL (ref 0.4–1.0)
Beta Globulin: 0.6 g/dL — ABNORMAL LOW (ref 0.7–1.3)
Gamma Globulin: 2.1 g/dL — ABNORMAL HIGH (ref 0.4–1.8)
Globulin, Total: 3.4 g/dL (ref 2.2–3.9)
M-Spike, %: 1.2 g/dL — ABNORMAL HIGH
Total Protein ELP: 6.3 g/dL (ref 6.0–8.5)

## 2020-02-17 LAB — MAGNESIUM: Magnesium: 1.8 mg/dL (ref 1.7–2.4)

## 2020-02-17 LAB — GLUCOSE, CAPILLARY
Glucose-Capillary: 99 mg/dL (ref 70–99)
Glucose-Capillary: 105 mg/dL — ABNORMAL HIGH (ref 70–99)
Glucose-Capillary: 105 mg/dL — ABNORMAL HIGH (ref 70–99)
Glucose-Capillary: 105 mg/dL — ABNORMAL HIGH (ref 70–99)

## 2020-02-17 LAB — PARATHYROID HORMONE, INTACT (NO CA): PTH: 46 pg/mL (ref 15–65)

## 2020-02-17 LAB — HEPATITIS B SURFACE ANTIBODY, QUANTITATIVE: Hep B S AB Quant (Post): <3.1 m[IU]/mL — ABNORMAL LOW (ref >9.9)

## 2020-02-17 MED ORDER — METOPROLOL TARTRATE 12.5 MG HALF TABLET
12.5000 mg | ORAL_TABLET | Freq: Two times a day (BID) | ORAL | Status: DC
  Administered 2020-02-17 – 2020-02-18 (×3): 12.5 mg via ORAL
  Filled 2020-02-17 (×3): qty 1

## 2020-02-17 MED ORDER — MAGNESIUM SULFATE 2 GM/50ML IV SOLN
2.0000 g | Freq: Once | INTRAVENOUS | Status: AC
  Administered 2020-02-17: 2 g via INTRAVENOUS
  Filled 2020-02-17: qty 50

## 2020-02-17 NOTE — Progress Notes (Signed)
PROGRESS NOTE    [name]  MPN:[id number] DOB: 15-Nov-1945 DOA: 02/15/2020 PCP: Biagio Borg, MD   Brief Narrative: 74 year old with past medical history significant for diabetes, hypertension COPD, CKD stage IIIb (cr 1.3--2.1), A. fib who presents with AKI, metabolic acidosis and hyperkalemia.  Patient was initiated on hemodialysis this admission.  Patient presented to [facility], ED 7/28 with generalized weakness for a few days and altered mental status.    Assessment & Plan:   Active Problems:   AKI (acute kidney injury) ([place])   Pressure injury of skin  1-Severe AKI on CKD stage IIIb; critical ill on presentation -Prior Cr range (1.3--2.1) -Presented with metabolic acidosis CO2 9, hyperkalemia potassium 7.5, 168, creatinine 11 -Patient received IV calcium, insulin, dextrose, IV bicarb drip in the ED. -Started on CRRT on admission.  -Now on HD> day 2.  -SPEP, SFLC pending.  -nephrology suspect subacute decline vs acute component from med (NSAID, ACE, Diuretic) -plan for HD 0/08.  2-Metabolic acidosis: CO2 8, pH; 7.1 Improved with bicarb gtt and HD>  Resolved.   3-Acute metabolic encephalopathy: Multifactorial related to acidosis and uremia: CT head negative. Improved. Alert and oriented. Following command.   4-History of paroxysmal A. fib, hypertension hyperlipidemia He was transiently on heparin now back on Eliquis   Diabetes: Continue with a sliding scale insulin. -Hold metformin. Need to discontinue Metformin at discharge as well.  History of COPD: Continue with nebulizer  History of left lower extremity wound, Receiving hyperbaric oxygen therapy as an outpatient. Care consulted  Asymptomatic, runs VT; 6 runs.  Replete MG IV.  Resume low dose home metoprolol.   LE wound;  Wound care consulted.   Pressure Injury 02/16/20 Sacrum Bilateral Stage 2 -  Partial thickness loss of dermis presenting as a shallow open injury with a red, pink wound bed  without slough. (Active)  02/16/20 0400  Location: Sacrum  Location Orientation: Bilateral  Staging: Stage 2 -  Partial thickness loss of dermis presenting as a shallow open injury with a red, pink wound bed without slough.  Wound Description (Comments):   Present on Admission: Yes     Pressure Injury 02/16/20 Leg Right Deep Tissue Pressure Injury - Purple or maroon localized area of discolored intact skin or blood-filled blister due to damage of underlying soft tissue from pressure and/or shear. (Active)  02/16/20   Location: Leg  Location Orientation: Right  Staging: Deep Tissue Pressure Injury - Purple or maroon localized area of discolored intact skin or blood-filled blister due to damage of underlying soft tissue from pressure and/or shear.  Wound Description (Comments):   Present on Admission: Yes     Pressure Injury 02/16/20 Right Stage 2 -  Partial thickness loss of dermis presenting as a shallow open injury with a red, pink wound bed without slough. (Active)  02/16/20 2200  Location:   Location Orientation: Right  Staging: Stage 2 -  Partial thickness loss of dermis presenting as a shallow open injury with a red, pink wound bed without slough.  Wound Description (Comments):   Present on Admission: Yes     Pressure Injury 02/16/20 Buttocks Right;Lateral Stage 2 -  Partial thickness loss of dermis presenting as a shallow open injury with a red, pink wound bed without slough. (Active)  02/16/20 2200  Location: Buttocks  Location Orientation: Right;Lateral  Staging: Stage 2 -  Partial thickness loss of dermis presenting as a shallow open injury with a red, pink wound bed without slough.  Wound Description (  Comments):   Present on Admission: Yes     Nutrition Problem: Increased nutrient needs Etiology: chronic illness (CHF, COPD)    Signs/Symptoms: estimated needs    Interventions: MVI, Nepro shake, Liberalize Diet  Estimated body mass index is 35.63 kg/m as  calculated from the following:   Height as of this encounter: 6\' 1"  (1.854 m).   Weight as of this encounter: 122.5 kg.   DVT prophylaxis: Eliquis Code Status: Full code Family Communication: Daughters at bedside Disposition Plan:  Status is: Inpatient  Remains inpatient appropriate because:Persistent severe electrolyte disturbances   Dispo: The patient is from: Home              Anticipated d/c is to: to be determine              Anticipated d/c date is: 3 days              Patient currently is not medically stable to d/c. needs to determine ESRD and if requirement of HD .         Consultants:   Nephrology  CCM  Procedures:  CRRT HD Antimicrobials:    Subjective: He denies chest pain, dyspnea. He is alert and conversant.  He is oriented to place and situation.    Objective: Vitals:   02/16/20 2030 02/16/20 2106 02/17/20 0110 02/17/20 0525  BP: (!) 96/47 (!) 85/76 105/67 (!) 105/54  Pulse: 70 68 73 70  Resp: 18 16 17 17   Temp:  98.9 F (37.2 C) 98.4 F (36.9 C) 98.2 F (36.8 C)  TempSrc:  Oral Oral Oral  SpO2: 94% 100% 100% 100%  Weight:      Height:        Intake/Output Summary (Last 24 hours) at 02/17/2020 0726 Last data filed at 02/17/2020 0200 Gross per 24 hour  Intake 0 ml  Output -100 ml  Net 100 ml   Filed Weights   02/15/20 1357 02/16/20 0054  Weight: (!) 122.5 kg (!) 122.5 kg    Examination:  General exam: Appears calm and comfortable  Respiratory system: Clear to auscultation. Respiratory effort normal. Cardiovascular system: S1 & S2 heard, RRR. No JVD, murmurs, rubs, gallops or clicks. No pedal edema. Gastrointestinal system: Abdomen is nondistended, soft and nontender. No organomegaly or masses felt. Normal bowel sounds heard. Central nervous system: Alert and oriented. No focal neurological deficits. Extremities: Symmetric 5 x 5 power. Skin: No rashes, lesions or ulcers Psychiatry: Judgement and insight appear normal. Mood &  affect appropriate.     Data Reviewed: I have personally reviewed following labs and imaging studies  CBC: Recent Labs  Lab 02/15/20 1430 02/15/20 2011 02/16/20 0441  WBC 6.2  --  4.8  NEUTROABS 4.9  --   --   HGB 11.0* 11.9* 10.5*  HCT 35.4* 35.0* 32.3*  MCV 90.5  --  85.2  PLT PLATELET CLUMPS NOTED ON SMEAR, UNABLE TO ESTIMATE  --  027*   Basic Metabolic Panel: Recent Labs  Lab 02/15/20 1747 02/15/20 1747 02/15/20 2011 02/15/20 2230 02/16/20 0109 02/16/20 0441 02/16/20 1222  NA 133*   < > 140 134* 135 138 137  K >7.5*   < > 6.3* 6.6* 6.5* 4.6 4.6  CL 111  --   --  111 113* 107 104  CO2 9*  --   --  9* 10* 18* 17*  GLUCOSE 88  --   --  72 127* 90 125*  BUN 168*  --   --  162* 153* 100* 95*  CREATININE 11.66*  --   --  10.81* 10.42* 7.06* 6.87*  CALCIUM 9.2  --   --  9.5 9.1 9.0 8.5*  MG  --   --   --   --  2.4  --   --   PHOS  --   --   --  5.9* 5.8* 4.1  --    < > = values in this interval not displayed.   GFR: Estimated Creatinine Clearance: 13.1 mL/min (A) (by C-G formula based on SCr of 6.87 mg/dL (H)). Liver Function Tests: Recent Labs  Lab 02/15/20 1747 02/16/20 0441  AST 13*  --   ALT 9  --   ALKPHOS 64  --   BILITOT 0.6  --   PROT 7.7  --   ALBUMIN 3.3* 3.1*   No results for input(s): LIPASE, AMYLASE in the last 168 hours. No results for input(s): AMMONIA in the last 168 hours. Coagulation Profile: No results for input(s): INR, PROTIME in the last 168 hours. Cardiac Enzymes: Recent Labs  Lab 02/15/20 2230  CKTOTAL 230   BNP (last 3 results) No results for input(s): PROBNP in the last 8760 hours. HbA1C: Recent Labs    02/15/20 2250  HGBA1C 6.2*   CBG: Recent Labs  Lab 02/16/20 1220 02/16/20 1510 02/16/20 1903 02/16/20 2111 02/17/20 0642  GLUCAP 107* 113* 87 78 99   Lipid Profile: No results for input(s): CHOL, HDL, LDLCALC, TRIG, CHOLHDL, LDLDIRECT in the last 72 hours. Thyroid Function Tests: No results for input(s): TSH,  T4TOTAL, FREET4, T3FREE, THYROIDAB in the last 72 hours. Anemia Panel: No results for input(s): VITAMINB12, FOLATE, FERRITIN, TIBC, IRON, RETICCTPCT in the last 72 hours. Sepsis Labs: No results for input(s): PROCALCITON, LATICACIDVEN in the last 168 hours.  Recent Results (from the past 240 hour(s))  SARS Coronavirus 2 by RT PCR     Status: None   Collection Time: 02/15/20  4:09 PM  Result Value Ref Range Status   SARS Coronavirus 2 NEGATIVE NEGATIVE Final    Comment: (NOTE) Result indicates the ABSENCE of SARS-CoV-2 RNA in the patient specimen.   The lowest concentration of SARS-CoV-2 viral copies this assay can detect in nasopharyngeal swab specimens is 500 copies / mL.  A negative result does not preclude SARS-CoV-2 infection and should not be used as the sole basis for patient management decisions. A negative result may occur with improper specimen collection / handling, submission of a specimen other than nasopharyngeal swab, presence of viral mutation(s) within the areas targeted by this assay, and inadequate number of viral copies (<500 copies / mL) present.  Negative results must be combined with clinical observations, patient history, and epidemiological information.   The expected result is NEGATIVE.   Patient Fact Sheet:  BlogSelections.co.uk    Provider Fact Sheet:  https://lucas.com/    This test is not yet approved or cleared by the [place] FDA and  has been [place] orized for detection and/or diagnosis of SARS-CoV-2 by FDA under an Emergency Use Authorization (EUA).  This EUA will remain in effect (meaning this test can be used) for the duration of  the COVID-19 declaration under Section 564(b)(1) of the Act, 21 U.S.C. section 360bbb-3(b)(1), unless the authorization is terminated or revoked sooner  Performed at [facility], [place] [address], [place], [place] [address]   MRSA PCR Screening     Status:  None   Collection Time: 02/16/20  4:45 AM   Specimen:  Nasopharyngeal  Result Value Ref Range Status   MRSA by PCR NEGATIVE NEGATIVE Final    Comment:        The GeneXpert MRSA Assay (FDA approved for NASAL specimens only), is one component of a comprehensive MRSA colonization surveillance program. It is not intended to diagnose MRSA infection nor to guide or monitor treatment for MRSA infections. Performed at [facility], [place] [address], [place], [place] [address]          Radiology Studies: CT Head Wo Contrast  Result Date: 02/15/2020 CLINICAL DATA:  Mental status change EXAM: CT HEAD WITHOUT CONTRAST TECHNIQUE: Contiguous axial images were obtained from the base of the skull through the vertex without intravenous contrast. COMPARISON:  None. FINDINGS: Brain: No hemorrhage or intracranial mass is visualized. Small focus of encephalomalacia in the left occipital lobe consistent with chronic infarct. Moderate atrophy. Moderate hypodensity in the white matter likely chronic small vessel ischemic change. Prominent ventricles felt secondary to atrophy. Vascular: No hyperdense vessels.  Carotid vascular calcification Skull: Normal. Negative for fracture or focal lesion. Sinuses/Orbits: Mild mucosal thickening in the ethmoid sinuses Other: None IMPRESSION: 1. No CT evidence for acute intracranial abnormality. 2. Atrophy and small vessel ischemic changes of the white matter. Chronic left occipital lobe infarct. Electronically Signed   By: Donavan Foil M.D.   On: 02/15/2020 16:09   US RENAL  Result Date: 02/15/2020 CLINICAL DATA:  Acute kidney injury. EXAM: RENAL / URINARY TRACT ULTRASOUND COMPLETE COMPARISON:  Abdominal CT 05/27/2018. Remote abdominal MRI 01/27/2011 FINDINGS: Right Kidney: Renal measurements: 12.9 x 6.8 x 5.0 cm = volume: 228 mL. Increased renal parenchymal echogenicity. No hydronephrosis. Cyst in the mid kidney measures 2.9 x 2.3 x 1.9 cm. Additional cysts on prior CT  are not well characterized. No evidence of solid lesion. Previous stones on CT are not well seen by ultrasound. Left Kidney: Renal measurements: 12.8 x 6.5 x 4.8 cm = volume: 208 mL. Increased renal parenchymal echogenicity. No hydronephrosis. 2 cyst in the left kidney, exophytic 8.2 x 7.4 x 7.7 cm cyst arises from the upper pole. Cyst in the lower kidney measures 3.5 x 3 x 3.1 cm. Additional cysts on CT are not well resolved by ultrasound. Previous stones on CT are not seen by ultrasound. Bladder: Appears normal for degree of bladder distention. Other: None. IMPRESSION: 1. Increased renal parenchymal echogenicity consistent with chronic medical renal disease. No obstructive uropathy. 2. Bilateral renal cysts. Many of the cysts on prior CT are not well-defined by ultrasound. 3. Renal stones on prior exam are not demonstrated. Electronically Signed   By: Keith Rake M.D.   On: 02/15/2020 21:39   DG Chest Port 1 View  Result Date: 02/16/2020 CLINICAL DATA:  Respiratory failure. EXAM: PORTABLE CHEST 1 VIEW COMPARISON:  02/15/2020.  09/08/2014. FINDINGS: Left IJ line stable position. Heart size stable. Left base atelectasis/scarring again noted. Mild left base infiltrate cannot be excluded. Stable elevation left hemidiaphragm. No pleural effusion or pneumothorax. And thoracic spine. IMPRESSION: 1.  Left IJ line stable position. 2. Left base atelectasis/scarring again noted. Mild left base infiltrate cannot be excluded. Stable elevation left hemidiaphragm. Electronically Signed   By: Marcello Moores  Register   On: 02/16/2020 05:54   DG CHEST PORT 1 VIEW  Result Date: 02/15/2020 CLINICAL DATA:  Follow-up central line placement EXAM: PORTABLE CHEST 1 VIEW COMPARISON:  02/15/2020 FINDINGS: Cardiac shadow is enlarged but stable. Left jugular central line is noted in the mid superior vena cava. No pneumothorax  is noted. No infiltrate or effusion is noted. IMPRESSION: No pneumothorax following central line placement.  Electronically Signed   By: Inez Catalina M.D.   On: 02/15/2020 21:06   DG Chest Portable 1 View  Result Date: 02/15/2020 CLINICAL DATA:  Generalized weakness. EXAM: PORTABLE CHEST 1 VIEW COMPARISON:  02/25/2018 FINDINGS: Heart size mildly enlarged. Vascularity normal. Elevated left hemidiaphragm with mild left lower lobe atelectasis. Negative for pneumonia or effusion. IMPRESSION: Mild left lower lobe atelectasis.  Negative for heart failure. Electronically Signed   By: Franchot Gallo M.D.   On: 02/15/2020 15:14        Scheduled Meds: . (feeding supplement) PROSource Plus  30 mL Oral BID BM  . apixaban  5 mg Oral BID  . Chlorhexidine Gluconate Cloth  6 each Topical Q0600  . feeding supplement (NEPRO CARB STEADY)  237 mL Oral BID BM  . insulin aspart  0-5 Units Subcutaneous QHS  . insulin aspart  0-6 Units Subcutaneous TID WC  . multivitamin  1 tablet Oral QHS  . pravastatin  40 mg Oral Daily  . sodium chloride flush  10-40 mL Intracatheter Q12H   Continuous Infusions:   LOS: 2 days    Time spent: 35 minutes.     Elmarie Shiley, MD [facility]   If 7PM-7AM, please contact night-coverage [email or web address]  02/17/2020, 7:26 AM

## 2020-02-17 NOTE — Progress Notes (Signed)
PT Cancellation Note  Patient Details Name: [name] MRN: [id number] DOB: [birth year]   Cancelled Treatment:    Reason Eval/Treat Not Completed: Other (comment) Pt verbalized understanding importance of PT eval and of activity, but requested to begin tomorrow.  Also, reports that he will likely need 2 people to assist.  Abran Richard, PT Acute Rehab Services Pager [phone] [facility] 02/17/2020, 4:36 PM

## 2020-02-17 NOTE — Progress Notes (Signed)
Patient had 4 runs of V tach. Md notify.

## 2020-02-17 NOTE — Progress Notes (Signed)
  Echocardiogram 2D Echocardiogram has been performed.  Reford Olliff G Daris Harkins 02/17/2020, 2:19 PM

## 2020-02-17 NOTE — Consult Note (Signed)
[facility] Nurse Consult Note: Patient receiving care in [place]. Reason for Consult: RLE and foot  Patient was seen by [facility] nurse yesterday for these same wounds.  Please see note and order. Thank you for the consult.  [facility] nurse will not follow at this time.  Please re-consult the [facility] team if needed.  Val Riles, RN, MSN, CWOCN, CNS-BC, pager [phone] Wound type: Pressure Injury POA: Yes/No/NA Measurement: Wound bed: Drainage (amount, consistency, odor)  Periwound: Dressing procedure/placement/frequency: Conservative sharp wound debridement (CSWD performed at the bedside):

## 2020-02-17 NOTE — Progress Notes (Signed)
Patient refused skin assessment on right leg at this time stating that "it has already been checked". Will continue to monitor.   Karleigh Bunte,RN.

## 2020-02-17 NOTE — Progress Notes (Signed)
Admit: 02/15/2020 LOS: 2  21M severe renal failure, hyperkalemia, acidosis  Subjective:  . HD#2 yesterday,  . 0.4L UOP .  K and HCO3 WNL . BPs stable . Sig anorexia and dysgeusia for weeks leadign up to admission . SCr 2.17 in March 2021 . SPEP and SFLC pending . Off NaHCO3gtt . Daughters at bedside, updated  07/29 0701 - 07/30 0700 In: 0  Out: -100 [Urine:400]  Filed Weights   02/15/20 1357 02/16/20 0054  Weight: (!) 122.5 kg (!) 122.5 kg    Scheduled Meds: . (feeding supplement) PROSource Plus  30 mL Oral BID BM  . apixaban  5 mg Oral BID  . Chlorhexidine Gluconate Cloth  6 each Topical Q0600  . feeding supplement (NEPRO CARB STEADY)  237 mL Oral BID BM  . insulin aspart  0-5 Units Subcutaneous QHS  . insulin aspart  0-6 Units Subcutaneous TID WC  . metoprolol tartrate  12.5 mg Oral BID  . multivitamin  1 tablet Oral QHS  . pravastatin  40 mg Oral Daily  . sodium chloride flush  10-40 mL Intracatheter Q12H   Continuous Infusions: . magnesium sulfate bolus IVPB     PRN Meds:.docusate sodium, heparin, lip balm, Muscle Rub, polyethylene glycol, sodium chloride flush  Current Labs: reviewed    Physical Exam:  Blood pressure (!) 119/55, pulse 66, temperature 99 F (37.2 C), temperature source Oral, resp. rate 17, height 6\' 1"  (1.854 m), weight (!) 122.5 kg, SpO2 100 %. Ill appaering RLE bandaged LLE trace edema RRR ctab Coarse bs b/l S/nt/nd Nonfocal  A 1. AKI with baseline CKD3 (but some worsenign earlier this year), sounds like was uremic for sev weeks prior to admission (anorexia, dysgeusia) and I suspect subacute decline,. Could have acute component (NSAIDs, ACEi, diuretics) Imaging negative 1. Started HD7/28, again 7/29 2. Plan for HD 7/30: 3h, 2K, 300/500, 1L UF, no heparin, TDC 3. SPEP and SFLC pending 4. MOnitor UOP and change in labs over weekend to see if futher HD needed 2. Hyperkalemia, severe, resolved 3. Metabolic Acidosis from #1, improved  resolved off NaHCO3 gtt 4. Pyuria, UCx ordered, ASx,   5. Anemia, mild stable 6. DM2 7. RLE leg wound seeing wound care 8. HfPEF 9. Hx/o AFib 10. Hx/o BPH and prostatic Ca s/p prostacectomy  P . HD tomorrow as above, . As above . Daily weights, Daily Renal Panel, Strict I/Os, Avoid nephrotoxins (NSAIDs, judicious IV Contrast)  . Medication Issues; o Preferred narcotic agents for pain control are hydromorphone, fentanyl, and methadone. Morphine should not be used.  o Baclofen should be avoided o Avoid oral sodium phosphate and magnesium citrate based laxatives / bowel preps    Pearson Grippe MD 02/17/2020, 11:26 AM  Recent Labs  Lab 02/16/20 0109 02/16/20 0109 02/16/20 0441 02/16/20 1222 02/17/20 0421  NA 135   < > 138 137 138  K 6.5*   < > 4.6 4.6 4.2  CL 113*   < > 107 104 103  CO2 10*   < > 18* 17* 26  GLUCOSE 127*   < > 90 125* 98  BUN 153*   < > 100* 95* 52*  CREATININE 10.42*   < > 7.06* 6.87* 4.20*  CALCIUM 9.1   < > 9.0 8.5* 8.4*  PHOS 5.8*  --  4.1  --  3.3   < > = values in this interval not displayed.   Recent Labs  Lab 02/15/20 1430 02/15/20 1430 02/15/20 2011 02/16/20 0441 02/17/20 0421  WBC 6.2  --   --  4.8 4.7  NEUTROABS 4.9  --   --   --   --   HGB 11.0*   < > 11.9* 10.5* 9.8*  HCT 35.4*   < > 35.0* 32.3* 29.8*  MCV 90.5  --   --  85.2 84.4  PLT PLATELET CLUMPS NOTED ON SMEAR, UNABLE TO ESTIMATE  --   --  124* PLATELET CLUMPS NOTED ON SMEAR, COUNT APPEARS DECREASED   < > = values in this interval not displayed.

## 2020-02-17 NOTE — Discharge Instructions (Signed)

## 2020-02-18 ENCOUNTER — Inpatient Hospital Stay (HOSPITAL_COMMUNITY): Payer: Medicare Other

## 2020-02-18 DIAGNOSIS — I472 Ventricular tachycardia: Secondary | ICD-10-CM

## 2020-02-18 LAB — TROPONIN I (HIGH SENSITIVITY)
Troponin I (High Sensitivity): 67 ng/L — ABNORMAL HIGH (ref <18)
Troponin I (High Sensitivity): 73 ng/L — ABNORMAL HIGH (ref <18)

## 2020-02-18 LAB — RENAL FUNCTION PANEL
Albumin: 2.6 g/dL — ABNORMAL LOW (ref 3.5–5.0)
Anion gap: 9 (ref 5–15)
BUN: 54 mg/dL — ABNORMAL HIGH (ref 8–23)
CO2: 23 mmol/L (ref 22–32)
Calcium: 8.6 mg/dL — ABNORMAL LOW (ref 8.9–10.3)
Chloride: 106 mmol/L (ref 98–111)
Creatinine, Ser: 4.1 mg/dL — ABNORMAL HIGH (ref 0.61–1.24)
GFR calc Af Amer: 16 mL/min — ABNORMAL LOW (ref >60)
GFR calc non Af Amer: 14 mL/min — ABNORMAL LOW (ref >60)
Glucose, Bld: 103 mg/dL — ABNORMAL HIGH (ref 70–99)
Phosphorus: 3.4 mg/dL (ref 2.5–4.6)
Potassium: 3.8 mmol/L (ref 3.5–5.1)
Sodium: 138 mmol/L (ref 135–145)

## 2020-02-18 LAB — GLUCOSE, CAPILLARY
Glucose-Capillary: 83 mg/dL (ref 70–99)
Glucose-Capillary: 117 mg/dL — ABNORMAL HIGH (ref 70–99)
Glucose-Capillary: 94 mg/dL (ref 70–99)
Glucose-Capillary: 94 mg/dL (ref 70–99)

## 2020-02-18 LAB — BASIC METABOLIC PANEL
Anion gap: 8 (ref 5–15)
BUN: 53 mg/dL — ABNORMAL HIGH (ref 8–23)
CO2: 25 mmol/L (ref 22–32)
Calcium: 8.7 mg/dL — ABNORMAL LOW (ref 8.9–10.3)
Chloride: 105 mmol/L (ref 98–111)
Creatinine, Ser: 3.86 mg/dL — ABNORMAL HIGH (ref 0.61–1.24)
GFR calc Af Amer: 17 mL/min — ABNORMAL LOW (ref >60)
GFR calc non Af Amer: 15 mL/min — ABNORMAL LOW (ref >60)
Glucose, Bld: 142 mg/dL — ABNORMAL HIGH (ref 70–99)
Potassium: 3.9 mmol/L (ref 3.5–5.1)
Sodium: 138 mmol/L (ref 135–145)

## 2020-02-18 LAB — MAGNESIUM: Magnesium: 2 mg/dL (ref 1.7–2.4)

## 2020-02-18 MED ORDER — METOPROLOL TARTRATE 25 MG PO TABS
25.0000 mg | ORAL_TABLET | Freq: Two times a day (BID) | ORAL | Status: DC
  Administered 2020-02-18 – 2020-02-23 (×9): 25 mg via ORAL
  Filled 2020-02-18 (×10): qty 1

## 2020-02-18 MED ORDER — METOPROLOL TARTRATE 12.5 MG HALF TABLET
12.5000 mg | ORAL_TABLET | Freq: Once | ORAL | Status: AC
  Administered 2020-02-18: 12.5 mg via ORAL
  Filled 2020-02-18: qty 1

## 2020-02-18 MED ORDER — CAPSAICIN 0.075 % EX CREA
TOPICAL_CREAM | Freq: Two times a day (BID) | CUTANEOUS | Status: DC
  Filled 2020-02-18: qty 60

## 2020-02-18 NOTE — Progress Notes (Addendum)
PROGRESS NOTE    [name]  WIO:[id number] DOB: 16-Jan-1946 DOA: 02/15/2020 PCP: Biagio Borg, MD   Brief Narrative: 74 year old with past medical history significant for diabetes, hypertension COPD, CKD stage IIIb (cr 1.3--2.1), A. fib who presents with AKI, metabolic acidosis and hyperkalemia.  Patient was initiated on hemodialysis this admission.  Patient presented to [facility] ED 7/28 with generalized weakness for a few days and altered mental status.    Assessment & Plan:   Active Problems:   AKI (acute kidney injury) ([place])   Pressure injury of skin  1-Severe AKI on CKD stage IIIb; critical ill on presentation; -Prior Cr range (1.3--2.1) -Presented with metabolic acidosis CO2 9, hyperkalemia potassium 7.5, 168, creatinine 11 -Patient received IV calcium, insulin, dextrose, IV bicarb drip in the ED. -Started on CRRT on admission.  -He has had HD> day 2.  -SPEP, SFLC pending.  -nephrology suspect subacute decline vs acute component from med (NSAID, ACE, Diuretic) -made 700 cc urine, nephrology planning to monitor over week to see if renal function recovers.   2-Metabolic acidosis: CO2 8, pH; 7.1 Improved with bicarb gtt and HD>  Resolved.   3-Acute metabolic encephalopathy: Multifactorial related to acidosis and uremia: CT head negative. Improved. Alert and oriented. Following command.   4-History of paroxysmal A. fib, hypertension hyperlipidemia He was transiently on heparin now back on Eliquis   Diabetes: Continue with a sliding scale insulin. -Hold metformin. Need to discontinue Metformin at discharge as well.  History of COPD: Continue with nebulizer  History of left lower extremity wound, Receiving hyperbaric oxygen therapy as an outpatient. Care consulted  Asymptomatic, runs VT; 6 runs.  Replete MG IV.  Resume low dose home metoprolol.  Addendum; runs of vt this afternoon.  Will increase metoprolol, echo yesterday normal ef.  Check mg and K  level this afternoon.  Check troponin.   Hypoxemia; cough Check chest x ray..   LE wound;  Wound care consulted.   Pressure Injury 02/16/20 Sacrum Bilateral Stage 2 -  Partial thickness loss of dermis presenting as a shallow open injury with a red, pink wound bed without slough. (Active)  02/16/20 0400  Location: Sacrum  Location Orientation: Bilateral  Staging: Stage 2 -  Partial thickness loss of dermis presenting as a shallow open injury with a red, pink wound bed without slough.  Wound Description (Comments):   Present on Admission: Yes     Pressure Injury 02/16/20 Leg Right Deep Tissue Pressure Injury - Purple or maroon localized area of discolored intact skin or blood-filled blister due to damage of underlying soft tissue from pressure and/or shear. (Active)  02/16/20   Location: Leg  Location Orientation: Right  Staging: Deep Tissue Pressure Injury - Purple or maroon localized area of discolored intact skin or blood-filled blister due to damage of underlying soft tissue from pressure and/or shear.  Wound Description (Comments):   Present on Admission: Yes     Pressure Injury 02/16/20 Right Stage 2 -  Partial thickness loss of dermis presenting as a shallow open injury with a red, pink wound bed without slough. (Active)  02/16/20 2200  Location:   Location Orientation: Right  Staging: Stage 2 -  Partial thickness loss of dermis presenting as a shallow open injury with a red, pink wound bed without slough.  Wound Description (Comments):   Present on Admission: Yes     Pressure Injury 02/16/20 Buttocks Right;Lateral Stage 2 -  Partial thickness loss of dermis presenting as a shallow open  injury with a red, pink wound bed without slough. (Active)  02/16/20 2200  Location: Buttocks  Location Orientation: Right;Lateral  Staging: Stage 2 -  Partial thickness loss of dermis presenting as a shallow open injury with a red, pink wound bed without slough.  Wound Description  (Comments):   Present on Admission: Yes     Nutrition Problem: Increased nutrient needs Etiology: chronic illness (CHF, COPD)    Signs/Symptoms: estimated needs    Interventions: MVI, Nepro shake, Liberalize Diet  Estimated body mass index is 35.63 kg/m as calculated from the following:   Height as of this encounter: 6\' 1"  (1.854 m).   Weight as of this encounter: 122.5 kg.   DVT prophylaxis: Eliquis Code Status: Full code Family Communication: Daughters at bedside Disposition Plan:  Status is: Inpatient  Remains inpatient appropriate because:Persistent severe electrolyte disturbances   Dispo: The patient is from: Home              Anticipated d/c is to: to be determine              Anticipated d/c date is: 3 days              Patient currently is not medically stable to d/c. needs to determine ESRD and if requirement of HD .         Consultants:   Nephrology  CCM  Procedures:  CRRT HD Antimicrobials:    Subjective: He is working with PT, he got in the recliner.  He denies dyspnea.     Objective: Vitals:   02/17/20 2141 02/17/20 2228 02/18/20 0611 02/18/20 0937  BP: (!) 101/61  (!) 112/64 (!) 100/48  Pulse: 57 91 76 61  Resp: 16  16 18   Temp: 98.4 F (36.9 C)  98.8 F (37.1 C) 99.4 F (37.4 C)  TempSrc:   Oral Oral  SpO2: 100%  100% 100%  Weight:      Height:        Intake/Output Summary (Last 24 hours) at 02/18/2020 1304 Last data filed at 02/18/2020 0900 Gross per 24 hour  Intake 290 ml  Output 700 ml  Net -410 ml   Filed Weights   02/15/20 1357 02/16/20 0054  Weight: (!) 122.5 kg (!) 122.5 kg    Examination:  General exam: NAD Respiratory system: CTA Cardiovascular system: S 1, S 2 RRR Gastrointestinal system: BS present, soft, nt Central nervous system: alert and oriented Extremities: symmetric power   Data Reviewed: I have personally reviewed following labs and imaging studies  CBC: Recent Labs  Lab  02/15/20 1430 02/15/20 2011 02/16/20 0441 02/17/20 0421  WBC 6.2  --  4.8 4.7  NEUTROABS 4.9  --   --   --   HGB 11.0* 11.9* 10.5* 9.8*  HCT 35.4* 35.0* 32.3* 29.8*  MCV 90.5  --  85.2 84.4  PLT PLATELET CLUMPS NOTED ON SMEAR, UNABLE TO ESTIMATE  --  124* PLATELET CLUMPS NOTED ON SMEAR, COUNT APPEARS DECREASED   Basic Metabolic Panel: Recent Labs  Lab 02/15/20 2230 02/15/20 2230 02/16/20 0109 02/16/20 0109 02/16/20 0441 02/16/20 1222 02/17/20 0421 02/17/20 1241 02/18/20 0420  NA 134*   < > 135   < > 138 137 138 137 138  K 6.6*   < > 6.5*   < > 4.6 4.6 4.2 3.8 3.8  CL 111   < > 113*   < > 107 104 103 105 106  CO2 9*   < > 10*   < >  18* 17* 26 27 23   GLUCOSE 72   < > 127*   < > 90 125* 98 126* 103*  BUN 162*   < > 153*   < > 100* 95* 52* 52* 54*  CREATININE 10.81*   < > 10.42*   < > 7.06* 6.87* 4.20* 4.32* 4.10*  CALCIUM 9.5   < > 9.1   < > 9.0 8.5* 8.4* 8.4* 8.6*  MG  --   --  2.4  --   --   --  1.8  --   --   PHOS 5.9*  --  5.8*  --  4.1  --  3.3  --  3.4   < > = values in this interval not displayed.   GFR: Estimated Creatinine Clearance: 22 mL/min (A) (by C-G formula based on SCr of 4.1 mg/dL (H)). Liver Function Tests: Recent Labs  Lab 02/15/20 1747 02/16/20 0441 02/17/20 0421 02/18/20 0420  AST 13*  --   --   --   ALT 9  --   --   --   ALKPHOS 64  --   --   --   BILITOT 0.6  --   --   --   PROT 7.7  --   --   --   ALBUMIN 3.3* 3.1* 2.7* 2.6*   No results for input(s): LIPASE, AMYLASE in the last 168 hours. No results for input(s): AMMONIA in the last 168 hours. Coagulation Profile: No results for input(s): INR, PROTIME in the last 168 hours. Cardiac Enzymes: Recent Labs  Lab 02/15/20 2230  CKTOTAL 230   BNP (last 3 results) No results for input(s): PROBNP in the last 8760 hours. HbA1C: Recent Labs    02/15/20 2250  HGBA1C 6.2*   CBG: Recent Labs  Lab 02/17/20 1111 02/17/20 1646 02/17/20 2141 02/18/20 0650 02/18/20 1132  GLUCAP 105*  105* 105* 83 117*   Lipid Profile: No results for input(s): CHOL, HDL, LDLCALC, TRIG, CHOLHDL, LDLDIRECT in the last 72 hours. Thyroid Function Tests: No results for input(s): TSH, T4TOTAL, FREET4, T3FREE, THYROIDAB in the last 72 hours. Anemia Panel: No results for input(s): VITAMINB12, FOLATE, FERRITIN, TIBC, IRON, RETICCTPCT in the last 72 hours. Sepsis Labs: No results for input(s): PROCALCITON, LATICACIDVEN in the last 168 hours.  Recent Results (from the past 240 hour(s))  SARS Coronavirus 2 by RT PCR     Status: None   Collection Time: 02/15/20  4:09 PM  Result Value Ref Range Status   SARS Coronavirus 2 NEGATIVE NEGATIVE Final    Comment: (NOTE) Result indicates the ABSENCE of SARS-CoV-2 RNA in the patient specimen.   The lowest concentration of SARS-CoV-2 viral copies this assay can detect in nasopharyngeal swab specimens is 500 copies / mL.  A negative result does not preclude SARS-CoV-2 infection and should not be used as the sole basis for patient management decisions. A negative result may occur with improper specimen collection / handling, submission of a specimen other than nasopharyngeal swab, presence of viral mutation(s) within the areas targeted by this assay, and inadequate number of viral copies (<500 copies / mL) present.  Negative results must be combined with clinical observations, patient history, and epidemiological information.   The expected result is NEGATIVE.   Patient Fact Sheet:  BlogSelections.co.uk    Provider Fact Sheet:  https://lucas.com/    This test is not yet approved or cleared by the [place] FDA and  has been [place] orized for detection and/or diagnosis of  SARS-CoV-2 by FDA under an Emergency Use Authorization (EUA).  This EUA will remain in effect (meaning this test can be used) for the duration of  the COVID-19 declaration under Section 564(b)(1) of the Act, 21 U.S.C. section  360bbb-3(b)(1), unless the authorization is terminated or revoked sooner  Performed at [facility], [place] [address] [place], [place] [address]   MRSA PCR Screening     Status: None   Collection Time: 02/16/20  4:45 AM   Specimen: Nasopharyngeal  Result Value Ref Range Status   MRSA by PCR NEGATIVE NEGATIVE Final    Comment:        The GeneXpert MRSA Assay (FDA approved for NASAL specimens only), is one component of a comprehensive MRSA colonization surveillance program. It is not intended to diagnose MRSA infection nor to guide or monitor treatment for MRSA infections. Performed at [facility], [place] [address] [place], [place] [address]          Radiology Studies: ECHOCARDIOGRAM COMPLETE  Result Date: 02/17/2020    ECHOCARDIOGRAM REPORT   Patient Name:   [name] Date of Exam: 02/17/2020 Medical Rec #:  [id number]           Height:       73.0 in Accession #:    [id number]          Weight:       270.1 lb Date of Birth:  [birth year]           BSA:          2.445 m Patient Age:    26 years            BP:           119/55 mmHg Patient Gender: M                   HR:           72 bpm. Exam Location:  Inpatient Procedure: 2D Echo, Cardiac Doppler and Color Doppler Indications:    R94.31 Abnormal EKG  History:        Patient has prior history of Echocardiogram examinations, most                 recent 02/25/2018. COPD, Arrythmias:Atrial Fibrillation; Risk                 Factors:Hypertension, Diabetes and Dyslipidemia. Cancer. Alcohol                 use.  Sonographer:    Tiffany Dance Referring Phys: [id number] Tranell Wojtkiewicz A Shaira Sova IMPRESSIONS  1. Left ventricular ejection fraction, by estimation, is 60 to 65%. The left ventricle has normal function. The left ventricle has no regional wall motion abnormalities. There is mild concentric left ventricular hypertrophy. Left ventricular diastolic parameters are indeterminate due to underlying atrial fibrillation.  2. Right ventricular  systolic function is normal. The right ventricular size is normal. There is normal pulmonary artery systolic pressure. The estimated right ventricular systolic pressure is 46.5 mmHg.  3. Left atrial size was mildly dilated.  4. The mitral valve is normal in structure. Trivial mitral valve regurgitation. No evidence of mitral stenosis.  5. The aortic valve is tricuspid. Aortic valve regurgitation is not visualized. Mild aortic valve sclerosis is present, with no evidence of aortic valve stenosis.  6. Aortic dilatation noted. There is mild dilatation of the aortic root and of the ascending aorta measuring 40 mm and 52mm respectively.  7.  The inferior vena cava is normal in size with greater than 50% respiratory variability, suggesting right atrial pressure of 3 mmHg. FINDINGS  Left Ventricle: Left ventricular ejection fraction, by estimation, is 60 to 65%. The left ventricle has normal function. The left ventricle has no regional wall motion abnormalities. The left ventricular internal cavity size was normal in size. There is  mild concentric left ventricular hypertrophy. Left ventricular diastolic parameters are indeterminate. Normal left ventricular filling pressure. Right Ventricle: The right ventricular size is normal. No increase in right ventricular wall thickness. Right ventricular systolic function is normal. There is normal pulmonary artery systolic pressure. The tricuspid regurgitant velocity is 2.55 m/s, and  with an assumed right atrial pressure of 3 mmHg, the estimated right ventricular systolic pressure is 28.7 mmHg. Left Atrium: Left atrial size was mildly dilated. Right Atrium: Right atrial size was normal in size. Pericardium: There is no evidence of pericardial effusion. Mitral Valve: The mitral valve is normal in structure. Normal mobility of the mitral valve leaflets. Trivial mitral valve regurgitation. No evidence of mitral valve stenosis. Tricuspid Valve: The tricuspid valve is normal in  structure. Tricuspid valve regurgitation is trivial. No evidence of tricuspid stenosis. Aortic Valve: The aortic valve is tricuspid. Aortic valve regurgitation is not visualized. Mild aortic valve sclerosis is present, with no evidence of aortic valve stenosis. Pulmonic Valve: The pulmonic valve was normal in structure. Pulmonic valve regurgitation is not visualized. No evidence of pulmonic stenosis. Aorta: Aortic dilatation noted. There is mild dilatation of the aortic root and of the ascending aorta measuring 40 mm. Venous: The inferior vena cava is normal in size with greater than 50% respiratory variability, suggesting right atrial pressure of 3 mmHg. IAS/Shunts: No atrial level shunt detected by color flow Doppler.  LEFT VENTRICLE PLAX 2D LVIDd:         4.80 cm LVIDs:         3.20 cm LV PW:         1.30 cm LV IVS:        1.20 cm LVOT diam:     2.30 cm LV SV:         71 LV SV Index:   29 LVOT Area:     4.15 cm  RIGHT VENTRICLE          IVC RV Basal diam:  2.50 cm  IVC diam: 1.80 cm TAPSE (M-mode): 1.2 cm LEFT ATRIUM              Index       RIGHT ATRIUM           Index LA diam:        4.90 cm  2.00 cm/m  RA Area:     18.60 cm LA Vol (A2C):   119.0 ml 48.68 ml/m RA Volume:   48.20 ml  19.72 ml/m LA Vol (A4C):   75.2 ml  30.76 ml/m LA Biplane Vol: 95.8 ml  39.19 ml/m  AORTIC VALVE LVOT Vmax:   112.50 cm/s LVOT Vmean:  73.000 cm/s LVOT VTI:    0.171 m  AORTA Ao Root diam: 4.00 cm Ao Asc diam:  3.80 cm MITRAL VALVE               TRICUSPID VALVE MV Area (PHT): 3.38 cm    TR Peak grad:   26.0 mmHg MV Decel Time: 225 msec    TR Vmax:        255.00 cm/s MV E velocity: 65.20 cm/s  SHUNTS                            Systemic VTI:  0.17 m                            Systemic Diam: 2.30 cm Fransico Him MD Electronically signed by Fransico Him MD Signature Date/Time: 02/17/2020/3:11:41 PM    Final         Scheduled Meds: . (feeding supplement) PROSource Plus  30 mL Oral BID BM  .  apixaban  5 mg Oral BID  . Chlorhexidine Gluconate Cloth  6 each Topical Q0600  . feeding supplement (NEPRO CARB STEADY)  237 mL Oral BID BM  . insulin aspart  0-5 Units Subcutaneous QHS  . insulin aspart  0-6 Units Subcutaneous TID WC  . metoprolol tartrate  12.5 mg Oral BID  . multivitamin  1 tablet Oral QHS  . pravastatin  40 mg Oral Daily  . sodium chloride flush  10-40 mL Intracatheter Q12H   Continuous Infusions:   LOS: 3 days    Time spent: 35 minutes.     Elmarie Shiley, MD [facility]   If 7PM-7AM, please contact night-coverage [email or web address]  02/18/2020, 1:04 PM

## 2020-02-18 NOTE — Progress Notes (Signed)
Admit: 02/15/2020 LOS: 3  67M severe renal failure, hyperkalemia, acidosis  Subjective:  . Serum creatinine slightly improved this morning, BUN stable, made 0.7 L of urine overnight, K3.8 . Feels stronger, appetite picking up . Daughters at bedside, updated  07/30 0701 - 07/31 0700 In: 290 [P.O.:240; IV Piggyback:50] Out: 700 [Urine:700]  Filed Weights   02/15/20 1357 02/16/20 0054  Weight: (!) 122.5 kg (!) 122.5 kg    Scheduled Meds: . (feeding supplement) PROSource Plus  30 mL Oral BID BM  . apixaban  5 mg Oral BID  . Chlorhexidine Gluconate Cloth  6 each Topical Q0600  . feeding supplement (NEPRO CARB STEADY)  237 mL Oral BID BM  . insulin aspart  0-5 Units Subcutaneous QHS  . insulin aspart  0-6 Units Subcutaneous TID WC  . metoprolol tartrate  12.5 mg Oral BID  . multivitamin  1 tablet Oral QHS  . pravastatin  40 mg Oral Daily  . sodium chloride flush  10-40 mL Intracatheter Q12H   Continuous Infusions:  PRN Meds:.docusate sodium, heparin, lip balm, Muscle Rub, polyethylene glycol, sodium chloride flush  Current Labs: reviewed  Results for GIANNIS, CORPUZ (MRN [id number]) as of 02/18/2020 11:00  Ref. Range 02/16/2020 12:22  Kappa, lamda light chain ratio Latest Ref Range: 0.26 - 1.65  0.23 (L)    Physical Exam:  Blood pressure (!) 100/48, pulse 61, temperature 99.4 F (37.4 C), temperature source Oral, resp. rate 18, height 6\' 1"  (1.854 m), weight (!) 122.5 kg, SpO2 100 %. Stronger appearing RLE bandaged LLE trace edema RRR ctab Coarse bs b/l S/nt/nd Nonfocal  A 1. AKI with baseline CKD3 (but some worsenign earlier this year), sounds like was uremic for sev weeks prior to admission (anorexia, dysgeusia) and I suspect subacute decline. Could have acute component (NSAIDs, ACEi, diuretics) Imaging negative.  UOP improving and labs are stable suggesting GFR recovery 1. Started HD 7/28, again 7/29; not currently needed 2. Hold on dialysis today 3. SPEP pending;  SFLC K:L 0.23 4. Continue to MOnitor UOP and change in labs over weekend to see if futher HD needed 5. Still has temporary HD cath present 2. Hyperkalemia, severe, resolved 3. Metabolic Acidosis from #1, improved resolved off NaHCO3 gtt 4. Pyuria, UCx ordered, ASx,   5. Anemia, mild stable 6. DM2 7. RLE leg wound seeing wound care 8. HfPEF 9. Hx/o AFib 10. Hx/o BPH and prostatic Ca s/p prostacectomy  P . As above . Daily weights, Daily Renal Panel, Strict I/Os, Avoid nephrotoxins (NSAIDs, judicious IV Contrast)  . Medication Issues; o Preferred narcotic agents for pain control are hydromorphone, fentanyl, and methadone. Morphine should not be used.  o Baclofen should be avoided o Avoid oral sodium phosphate and magnesium citrate based laxatives / bowel preps    Pearson Grippe MD 02/18/2020, 11:00 AM  Recent Labs  Lab 02/16/20 0441 02/16/20 1222 02/17/20 0421 02/17/20 1241 02/18/20 0420  NA 138   < > 138 137 138  K 4.6   < > 4.2 3.8 3.8  CL 107   < > 103 105 106  CO2 18*   < > 26 27 23   GLUCOSE 90   < > 98 126* 103*  BUN 100*   < > 52* 52* 54*  CREATININE 7.06*   < > 4.20* 4.32* 4.10*  CALCIUM 9.0   < > 8.4* 8.4* 8.6*  PHOS 4.1  --  3.3  --  3.4   < > = values in this interval not  displayed.   Recent Labs  Lab 02/15/20 1430 02/15/20 1430 02/15/20 2011 02/16/20 0441 02/17/20 0421  WBC 6.2  --   --  4.8 4.7  NEUTROABS 4.9  --   --   --   --   HGB 11.0*   < > 11.9* 10.5* 9.8*  HCT 35.4*   < > 35.0* 32.3* 29.8*  MCV 90.5  --   --  85.2 84.4  PLT PLATELET CLUMPS NOTED ON SMEAR, UNABLE TO ESTIMATE  --   --  124* PLATELET CLUMPS NOTED ON SMEAR, COUNT APPEARS DECREASED   < > = values in this interval not displayed.

## 2020-02-18 NOTE — Plan of Care (Signed)
  Problem: Education: Goal: Knowledge of General Education information will improve Description: Including pain rating scale, medication(s)/side effects and non-pharmacologic comfort measures Outcome: Progressing   Problem: Safety: Goal: Ability to remain free from injury will improve Outcome: Progressing   

## 2020-02-18 NOTE — Consult Note (Signed)
Cardiology Consultation:   Patient IDTadhg [name]; [id number]; [birth year]   Admit date: 02/15/2020 Date of Consult: 02/18/2020  Primary Care Provider: Biagio Borg, MD Primary Cardiologist: No primary care provider on file. Primary Electrophysiologist:  None  Chief Complaint: NSVT  Patient Profile:   [name] is a 74 y.o. male with a hx of DM and HTN who is seen for NSVT.  History of Present Illness:   [name] was admitted on July 28 with weakness confusion and decreased appetite.  He was found to be in acute renal failure with a creatinine greater than 11 BUN 168.  And potassium 7.5.  He was started on hemodialysis.  Etiology of her renal failure is unclear but suspected to be subacute decline with component of CKD progression plus ARB plus NSAIDs.  He is now making more urine and plan is to see if his renal function recovers.  He has a history of persistent A. fib and is being managed with rate control.  On admission he was placed on telemetry and he has been noted to have a couple episodes of NSVT (longest 13 beats).  He had an echo completed yesterday which showed EF 60 to 65%, mild LVH, and otherwise unremarkable.  A troponin was checked today and found to be 67.  Patient has not had chest pain.  Last ECG was July 29.  His metoprolol has been held since admission but he was given 12.5 mg x2 today.  BP has been running 100/50-120/70 currently.  He takes metoprolol 100 mg XL at home.   Past Medical History:  Diagnosis Date  . Adenoma 05/2008  . Alcoholism in recovery ([place])   . BPH (benign prostatic hyperplasia)   . Cancer [facility])    Prostate  . Chronic LBP    Hip & Back -- Sees Dr. Maia Petties @ [facility]  . Colon polyps   . Complex renal cyst 12/2010  . Controlled type 2 diabetes mellitus with neuropathy ([place]) 01/2007  . COPD (chronic obstructive pulmonary disease) ([place])   . Diabetes ([place])   . Diverticulitis of colon   . ED (erectile  dysfunction)    s/p Penile prosthesis (09/2010)  . History of prostate cancer    Dr. Karsten Ro  . History of sick sinus syndrome    reduced BB dose for Bradycardia  . HLD (hyperlipidemia)   . Hypertension   . Impaired glucose tolerance 12/21/2013  . Nephrolithiasis   . Osteoarthritis of both hips   . Osteoarthritis of lumbosacral spine    with Disc Disease  . PAF (paroxysmal atrial fibrillation) ([facility])    No longer on Amiodarone.  Not on Anticoaguation b/c no recurrence.  . Paresthesias/numbness    Bilateral LE  . Peripheral neuropathy 12/21/2013    Past Surgical History:  Procedure Laterality Date  . CARDIAC CATHETERIZATION  2003   Normal Coronary Arteries.  Marland Kitchen NM MYOVIEW LTD  03/2016   No Ischemia or Infarct - Visual EF ~60% (computer EF ~39%) - by Echo 55-60%  . PENILE PROSTHESIS IMPLANT  06/21/2012   Procedure: PENILE PROTHESIS INFLATABLE;  Surgeon: Claybon Jabs, MD;  Location: [facility];  Service: Urology;  Laterality: N/A;  REMOVAL AND REPLACEMENT OF SOME  OF PROSTHESIS ([facility])   . PENILE PROSTHESIS PLACEMENT  09/2010  . PROSTATECTOMY    . REMOVAL OF PENILE PROSTHESIS N/A 02/15/2018   Procedure: REMOVAL OF PENILE PROSTHESIS;  Surgeon: Kathie Rhodes, MD;  Location: [facility];  Service:  Urology;  Laterality: N/A;  . TRANSTHORACIC ECHOCARDIOGRAM  04/2016   a) Relatively normal EF of 55-60%. No RWMA suggesting no prior MI. Gr 2 DD (pseudo-normal filling pattern) along with moderately dilated left atrium.;; b) 02/2018: Mod LVH. EF 65-70%. No RWMA.  Unable to assess DF 2/2 Afib. Mild MR. Severe BiAtrial Enlargement. High CVP.     Inpatient Medications: Scheduled Meds: . (feeding supplement) PROSource Plus  30 mL Oral BID BM  . apixaban  5 mg Oral BID  . capsicum   Topical BID  . Chlorhexidine Gluconate Cloth  6 each Topical Q0600  . feeding supplement (NEPRO CARB STEADY)  237 mL Oral BID BM  . insulin aspart  0-5 Units Subcutaneous QHS  . insulin aspart  0-6 Units  Subcutaneous TID WC  . metoprolol tartrate  25 mg Oral BID  . multivitamin  1 tablet Oral QHS  . pravastatin  40 mg Oral Daily  . sodium chloride flush  10-40 mL Intracatheter Q12H   Continuous Infusions:  PRN Meds: docusate sodium, lip balm, Muscle Rub, polyethylene glycol, sodium chloride flush  Home Meds: Prior to Admission medications   Medication Sig Start Date End Date Taking? Authorizing Provider  Albuterol Sulfate 108 (90 Base) MCG/ACT AEPB Inhale 2 puffs into the lungs every 6 (six) hours as needed. Patient taking differently: Inhale 2 puffs into the lungs every 6 (six) hours as needed (shortness of breath).  12/28/17  Yes Biagio Borg, MD  amLODipine (NORVASC) 10 MG tablet TAKE 1 TABLET BY MOUTH  DAILY Patient taking differently: Take 10 mg by mouth daily.  06/13/19  Yes Leonie Man, MD  benazepril (LOTENSIN) 40 MG tablet TAKE 1 TABLET BY MOUTH  DAILY Patient taking differently: Take 40 mg by mouth daily.  05/09/19  Yes Leonie Man, MD  Blood Glucose Monitoring Suppl (ONE TOUCH ULTRA 2) w/Device KIT Use as directed E11.9 09/01/17  Yes Biagio Borg, MD  furosemide (LASIX) 40 MG tablet TAKE 2 TABLETS BY  MOUTH in AM and 1 in the PM Patient taking differently: Take 20 mg by mouth See admin instructions. TAKE 2 TABLETS BY  MOUTH in AM and 1 tablet by mouth in the PM 10/06/19  Yes Biagio Borg, MD  glucose blood (ONE TOUCH ULTRA TEST) test strip Use as instructed once per day E11.9 12/10/18  Yes Biagio Borg, MD  Ibuprofen 200 MG CAPS Take 200 mg by mouth. Take 1 to 2 tablet twice a day as needed   Yes [provider]  loratadine (CLARITIN) 10 MG tablet Take 10 mg by mouth daily as needed for allergies.   Yes [provider]  metFORMIN (GLUCOPHAGE-XR) 500 MG 24 hr tablet TAKE 1 TABLET BY MOUTH  DAILY WITH BREAKFAST Patient taking differently: Take 500 mg by mouth daily.  10/31/19  Yes Biagio Borg, MD  metoprolol succinate (TOPROL-XL) 100 MG 24 hr tablet  Take 1 tablet (100 mg total) by mouth daily. Take with or immediately following a meal. Patient taking differently: Take 100 mg by mouth daily.  08/23/19  Yes Leonie Man, MD  omeprazole (PRILOSEC) 20 MG capsule TAKE 1 CAPSULE BY MOUTH  DAILY Patient taking differently: Take 20 mg by mouth daily.  08/22/19  Yes Leonie Man, MD  potassium chloride SA (KLOR-CON) 20 MEQ tablet TAKE 1 TO 2 TABLETS BY  MOUTH DAILY AS NEEDED FOR  WEIGHT GAIN OF 3LBS OR MORE Patient taking differently: Take 20 mEq by mouth  daily as needed (For weight gain of 2lbs or more).  10/31/19  Yes Leonie Man, MD  pravastatin (PRAVACHOL) 40 MG tablet TAKE 1 TABLET BY MOUTH  DAILY Patient taking differently: Take 40 mg by mouth daily.  05/09/19  Yes Leonie Man, MD  spironolactone (ALDACTONE) 25 MG tablet Take 1 tablet (25 mg total) by mouth daily. 10/07/19  Yes Biagio Borg, MD  Vitamin D, Ergocalciferol, (DRISDOL) 1.25 MG (50000 UNIT) CAPS capsule Take 1 capsule (50,000 Units total) by mouth every 7 (seven) days. 10/07/19  Yes Biagio Borg, MD  XARELTO 20 MG TABS tablet TAKE 1 TABLET BY MOUTH  DAILY WITH SUPPER Patient taking differently: Take 20 mg by mouth daily with supper.  08/31/19  Yes Leonie Man, MD    Allergies:    Allergies  Allergen Reactions  . Ciprofloxacin Other (See Comments)    All over weakness    Social History:   Social History   Socioeconomic History  . Marital status: Single    Spouse name: Not on file  . Number of children: 3  . Years of education: Not on file  . Highest education level: Not on file  Occupational History  . Occupation: Retired  Tobacco Use  . Smoking status: Former Smoker    Types: Cigarettes    Quit date: 11/04/1982    Years since quitting: 37.3  . Smokeless tobacco: Never Used  Vaping Use  . Vaping Use: Never used  Substance and Sexual Activity  . Alcohol use: Yes    Comment: no alcohol x 1 week wants to stop  . Drug use: No    Types: Marijuana     Comment: use to smoke marijuana  . Sexual activity: Not on file  Other Topics Concern  . 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   Divorced. 3 grown children. 4 GC.     Quit smoking ~20+ yrs ago.    Social Determinants of Health   Financial Resource Strain:   . Difficulty of Paying Living Expenses:   Food Insecurity:   . Worried About Charity fundraiser in the Last Year:   . Arboriculturist in the Last Year:   Transportation Needs:   . Film/video editor (Medical):   Marland Kitchen Lack of Transportation (Non-Medical):   Physical Activity:   . Days of Exercise per Week:   . Minutes of Exercise per Session:   Stress:   . Feeling of Stress :   Social Connections:   . Frequency of Communication with Friends and Family:   . Frequency of Social Gatherings with Friends and Family:   . Attends Religious Services:   . Active Member of Clubs or Organizations:   . Attends Archivist Meetings:   Marland Kitchen Marital Status:   Intimate Partner Violence:   . Fear of Current or Ex-Partner:   . Emotionally Abused:   Marland Kitchen Physically Abused:   . Sexually Abused:      Family History:    Family History  Problem Relation Age of Onset  . Coronary artery disease Mother   . Heart attack Mother   . Coronary artery disease Father   . Prostate cancer Father   . Diabetes Father       ROS:  Please see the history of present illness.   All other ROS reviewed and negative.     Physical  Exam/Data:   Vitals:   02/17/20 2228 02/18/20 0611 02/18/20 0937 02/18/20 1645  BP:  (!) 112/64 (!) 100/48 121/69  Pulse: 91 76 61 85  Resp:  '16 18 14  ' Temp:  98.8 F (37.1 C) 99.4 F (37.4 C) 98.5 F (36.9 C)  TempSrc:  Oral Oral Oral  SpO2:  100% 100% 100%  Weight:      Height:        Intake/Output Summary (Last 24 hours) at 02/18/2020 1855 Last data filed at 02/18/2020 1700 Gross per 24 hour  Intake 360 ml  Output 700 ml    Net -340 ml   Last 3 Weights 02/16/2020 02/16/2020 02/15/2020  Weight (lbs) (No Data) 270 lb 1 oz 270 lb  Weight (kg) (No Data) 122.5 kg 122.471 kg     Body mass index is 35.63 kg/m.  General: Well developed, well nourished, in no acute distress. Head: Normocephalic, atraumatic, sclera non-icteric, no xanthomas, nares are without discharge.  Neck: Negative for carotid bruits. JVD not elevated. Lungs: Clear bilaterally to auscultation without wheezes, rales, or rhonchi. Breathing is unlabored. Heart: RRR with S1 S2. No murmurs, rubs, or gallops appreciated. Abdomen: Soft, non-tender, non-distended with normoactive bowel sounds. No hepatomegaly. No rebound/guarding. No obvious abdominal masses. Msk:  Strength and tone appear normal for age. Extremities: No clubbing or cyanosis. No edema.  Distal pedal pulses are 2+ and equal bilaterally. Neuro: Alert and oriented X 3. No facial asymmetry. No focal deficit. Moves all extremities spontaneously. Psych:  Responds to questions appropriately with a normal affect.   EKG:  The EKG was personally reviewed and demonstrates:  Atrial fibrillation Telemetry:  Telemetry was personally reviewed and demonstrates:  Atrial fibrillation  Relevant CV Studies: See HPI  Laboratory Data:  High Sensitivity Troponin:   Recent Labs  Lab 02/18/20 1730  TROPONINIHS 67*     Cardiac EnzymesNo results for input(s): TROPONINI in the last 168 hours. No results for input(s): TROPIPOC in the last 168 hours.  Chemistry Recent Labs  Lab 02/17/20 1241 02/18/20 0420 02/18/20 1730  NA 137 138 138  K 3.8 3.8 3.9  CL 105 106 105  CO2 '27 23 25  ' GLUCOSE 126* 103* 142*  BUN 52* 54* 53*  CREATININE 4.32* 4.10* 3.86*  CALCIUM 8.4* 8.6* 8.7*  GFRNONAA 13* 14* 15*  GFRAA 15* 16* 17*  ANIONGAP '5 9 8    ' Recent Labs  Lab 02/15/20 1747 02/15/20 1747 02/16/20 0441 02/17/20 0421 02/18/20 0420  PROT 7.7  --   --   --   --   ALBUMIN 3.3*   < > 3.1* 2.7* 2.6*  AST  13*  --   --   --   --   ALT 9  --   --   --   --   ALKPHOS 64  --   --   --   --   BILITOT 0.6  --   --   --   --    < > = values in this interval not displayed.   Hematology Recent Labs  Lab 02/15/20 1430 02/15/20 1430 02/15/20 2011 02/16/20 0441 02/17/20 0421  WBC 6.2  --   --  4.8 4.7  RBC 3.91*  --   --  3.79* 3.53*  HGB 11.0*   < > 11.9* 10.5* 9.8*  HCT 35.4*   < > 35.0* 32.3* 29.8*  MCV 90.5  --   --  85.2 84.4  MCH 28.1  --   --  27.7 27.8  MCHC 31.1  --   --  32.5 32.9  RDW 15.5  --   --  14.7 14.5  PLT PLATELET CLUMPS NOTED ON SMEAR, UNABLE TO ESTIMATE  --   --  124* PLATELET CLUMPS NOTED ON SMEAR, COUNT APPEARS DECREASED   < > = values in this interval not displayed.   BNPNo results for input(s): BNP, PROBNP in the last 168 hours.  DDimer No results for input(s): DDIMER in the last 168 hours.   Radiology/Studies:  CT Head Wo Contrast  Result Date: 02/15/2020 CLINICAL DATA:  Mental status change EXAM: CT HEAD WITHOUT CONTRAST TECHNIQUE: Contiguous axial images were obtained from the base of the skull through the vertex without intravenous contrast. COMPARISON:  None. FINDINGS: Brain: No hemorrhage or intracranial mass is visualized. Small focus of encephalomalacia in the left occipital lobe consistent with chronic infarct. Moderate atrophy. Moderate hypodensity in the white matter likely chronic small vessel ischemic change. Prominent ventricles felt secondary to atrophy. Vascular: No hyperdense vessels.  Carotid vascular calcification Skull: Normal. Negative for fracture or focal lesion. Sinuses/Orbits: Mild mucosal thickening in the ethmoid sinuses Other: None IMPRESSION: 1. No CT evidence for acute intracranial abnormality. 2. Atrophy and small vessel ischemic changes of the white matter. Chronic left occipital lobe infarct. Electronically Signed   By: Donavan Foil M.D.   On: 02/15/2020 16:09   US RENAL  Result Date: 02/15/2020 CLINICAL DATA:  Acute kidney injury.  EXAM: RENAL / URINARY TRACT ULTRASOUND COMPLETE COMPARISON:  Abdominal CT 05/27/2018. Remote abdominal MRI 01/27/2011 FINDINGS: Right Kidney: Renal measurements: 12.9 x 6.8 x 5.0 cm = volume: 228 mL. Increased renal parenchymal echogenicity. No hydronephrosis. Cyst in the mid kidney measures 2.9 x 2.3 x 1.9 cm. Additional cysts on prior CT are not well characterized. No evidence of solid lesion. Previous stones on CT are not well seen by ultrasound. Left Kidney: Renal measurements: 12.8 x 6.5 x 4.8 cm = volume: 208 mL. Increased renal parenchymal echogenicity. No hydronephrosis. 2 cyst in the left kidney, exophytic 8.2 x 7.4 x 7.7 cm cyst arises from the upper pole. Cyst in the lower kidney measures 3.5 x 3 x 3.1 cm. Additional cysts on CT are not well resolved by ultrasound. Previous stones on CT are not seen by ultrasound. Bladder: Appears normal for degree of bladder distention. Other: None. IMPRESSION: 1. Increased renal parenchymal echogenicity consistent with chronic medical renal disease. No obstructive uropathy. 2. Bilateral renal cysts. Many of the cysts on prior CT are not well-defined by ultrasound. 3. Renal stones on prior exam are not demonstrated. Electronically Signed   By: Keith Rake M.D.   On: 02/15/2020 21:39   DG Chest Port 1 View  Result Date: 02/16/2020 CLINICAL DATA:  Respiratory failure. EXAM: PORTABLE CHEST 1 VIEW COMPARISON:  02/15/2020.  09/08/2014. FINDINGS: Left IJ line stable position. Heart size stable. Left base atelectasis/scarring again noted. Mild left base infiltrate cannot be excluded. Stable elevation left hemidiaphragm. No pleural effusion or pneumothorax. And thoracic spine. IMPRESSION: 1.  Left IJ line stable position. 2. Left base atelectasis/scarring again noted. Mild left base infiltrate cannot be excluded. Stable elevation left hemidiaphragm. Electronically Signed   By: Marcello Moores  Register   On: 02/16/2020 05:54   DG CHEST PORT 1 VIEW  Result Date:  02/15/2020 CLINICAL DATA:  Follow-up central line placement EXAM: PORTABLE CHEST 1 VIEW COMPARISON:  02/15/2020 FINDINGS: Cardiac shadow is enlarged but stable. Left jugular central line is noted in the mid superior vena  cava. No pneumothorax is noted. No infiltrate or effusion is noted. IMPRESSION: No pneumothorax following central line placement. Electronically Signed   By: Inez Catalina M.D.   On: 02/15/2020 21:06   DG Chest Portable 1 View  Result Date: 02/15/2020 CLINICAL DATA:  Generalized weakness. EXAM: PORTABLE CHEST 1 VIEW COMPARISON:  02/25/2018 FINDINGS: Heart size mildly enlarged. Vascularity normal. Elevated left hemidiaphragm with mild left lower lobe atelectasis. Negative for pneumonia or effusion. IMPRESSION: Mild left lower lobe atelectasis.  Negative for heart failure. Electronically Signed   By: Franchot Gallo M.D.   On: 02/15/2020 15:14   ECHOCARDIOGRAM COMPLETE  Result Date: 02/17/2020    ECHOCARDIOGRAM REPORT   Patient Name:   JAECOB LOWDEN Date of Exam: 02/17/2020 Medical Rec #:  [id number]           Height:       73.0 in Accession #:    [id number]          Weight:       270.1 lb Date of Birth:  [birth year]           BSA:          2.445 m Patient Age:    35 years            BP:           119/55 mmHg Patient Gender: M                   HR:           72 bpm. Exam Location:  Inpatient Procedure: 2D Echo, Cardiac Doppler and Color Doppler Indications:    R94.31 Abnormal EKG  History:        Patient has prior history of Echocardiogram examinations, most                 recent 02/25/2018. COPD, Arrythmias:Atrial Fibrillation; Risk                 Factors:Hypertension, Diabetes and Dyslipidemia. Cancer. Alcohol                 use.  Sonographer:    Tiffany Dance Referring Phys: [id number] BELKYS A REGALADO IMPRESSIONS  1. Left ventricular ejection fraction, by estimation, is 60 to 65%. The left ventricle has normal function. The left ventricle has no regional wall motion abnormalities. There is  mild concentric left ventricular hypertrophy. Left ventricular diastolic parameters are indeterminate due to underlying atrial fibrillation.  2. Right ventricular systolic function is normal. The right ventricular size is normal. There is normal pulmonary artery systolic pressure. The estimated right ventricular systolic pressure is 49.6 mmHg.  3. Left atrial size was mildly dilated.  4. The mitral valve is normal in structure. Trivial mitral valve regurgitation. No evidence of mitral stenosis.  5. The aortic valve is tricuspid. Aortic valve regurgitation is not visualized. Mild aortic valve sclerosis is present, with no evidence of aortic valve stenosis.  6. Aortic dilatation noted. There is mild dilatation of the aortic root and of the ascending aorta measuring 40 mm and 20m respectively.  7. The inferior vena cava is normal in size with greater than 50% respiratory variability, suggesting right atrial pressure of 3 mmHg. FINDINGS  Left Ventricle: Left ventricular ejection fraction, by estimation, is 60 to 65%. The left ventricle has normal function. The left ventricle has no regional wall motion abnormalities. The left ventricular internal cavity size was normal in size. There is  mild concentric left ventricular hypertrophy.  Left ventricular diastolic parameters are indeterminate. Normal left ventricular filling pressure. Right Ventricle: The right ventricular size is normal. No increase in right ventricular wall thickness. Right ventricular systolic function is normal. There is normal pulmonary artery systolic pressure. The tricuspid regurgitant velocity is 2.55 m/s, and  with an assumed right atrial pressure of 3 mmHg, the estimated right ventricular systolic pressure is 65.7 mmHg. Left Atrium: Left atrial size was mildly dilated. Right Atrium: Right atrial size was normal in size. Pericardium: There is no evidence of pericardial effusion. Mitral Valve: The mitral valve is normal in structure. Normal mobility  of the mitral valve leaflets. Trivial mitral valve regurgitation. No evidence of mitral valve stenosis. Tricuspid Valve: The tricuspid valve is normal in structure. Tricuspid valve regurgitation is trivial. No evidence of tricuspid stenosis. Aortic Valve: The aortic valve is tricuspid. Aortic valve regurgitation is not visualized. Mild aortic valve sclerosis is present, with no evidence of aortic valve stenosis. Pulmonic Valve: The pulmonic valve was normal in structure. Pulmonic valve regurgitation is not visualized. No evidence of pulmonic stenosis. Aorta: Aortic dilatation noted. There is mild dilatation of the aortic root and of the ascending aorta measuring 40 mm. Venous: The inferior vena cava is normal in size with greater than 50% respiratory variability, suggesting right atrial pressure of 3 mmHg. IAS/Shunts: No atrial level shunt detected by color flow Doppler.  LEFT VENTRICLE PLAX 2D LVIDd:         4.80 cm LVIDs:         3.20 cm LV PW:         1.30 cm LV IVS:        1.20 cm LVOT diam:     2.30 cm LV SV:         71 LV SV Index:   29 LVOT Area:     4.15 cm  RIGHT VENTRICLE          IVC RV Basal diam:  2.50 cm  IVC diam: 1.80 cm TAPSE (M-mode): 1.2 cm LEFT ATRIUM              Index       RIGHT ATRIUM           Index LA diam:        4.90 cm  2.00 cm/m  RA Area:     18.60 cm LA Vol (A2C):   119.0 ml 48.68 ml/m RA Volume:   48.20 ml  19.72 ml/m LA Vol (A4C):   75.2 ml  30.76 ml/m LA Biplane Vol: 95.8 ml  39.19 ml/m  AORTIC VALVE LVOT Vmax:   112.50 cm/s LVOT Vmean:  73.000 cm/s LVOT VTI:    0.171 m  AORTA Ao Root diam: 4.00 cm Ao Asc diam:  3.80 cm MITRAL VALVE               TRICUSPID VALVE MV Area (PHT): 3.38 cm    TR Peak grad:   26.0 mmHg MV Decel Time: 225 msec    TR Vmax:        255.00 cm/s MV E velocity: 65.20 cm/s                            SHUNTS                            Systemic VTI:  0.17 m  Systemic Diam: 2.30 cm Fransico Him MD Electronically signed by Fransico Him MD Signature Date/Time: 02/17/2020/3:11:41 PM    Final     Assessment and Plan:   1. NSVT He has had 2-3 episodes of nonsustained VT, the longest being 13 beats.  He is asymptomatic and has normal LV structure and function.  In this setting, we typically recommend initiation of a beta-blocker.  There is no strong indication for evaluation for ischemia in the absence of chest pain with a normal LVEF.  Suppression of asymptomatic NSVT with antiarrhythmics is also not indicated.  A single troponin value was checked today and found to be elevated in the 60s.  This is difficult to interpret in the setting of acute renal failure and is likely to be from poor renal clearance.  No further evaluation needed unless he develops chest pain. --Agree with starting metoprolol 25 mg twice daily, titrate up to home dose as tolerated      For questions or updates, please contact [place] Please consult [email or web address] for contact info under     Signed, Chriss Czar, MD  02/18/2020 6:55 PM

## 2020-02-18 NOTE — Evaluation (Signed)
Occupational Therapy Evaluation Patient Details Name: [name] MRN: [id number] DOB: 1946/01/25 Today's Date: 02/18/2020    History of Present Illness Pt is a 74 y/o male admitted secondary to confusion. Pt found to have a severe AKI on CKD stage IIIb, critical ill on presentation. Pt required HD during admission. PMH including but not limited to DM, HTN, CHF and CKD.   Clinical Impression   PTA pt living with family and functioning at mod I level with cane for mobility up until recent few weeks. Pt/daughter endorse period of progressive weakness and inability to mobilize full household distances. At time of eval, pt presents with ability to complete bed mobility at supervision and sit <> stands at min A with RW. Pt is deconditioned with mobility and easily knee buckles. He also has a decreased sense of safety awareness, so requires close monitoring with mobilization to prevent falls. Pt currently requires total A for LB BADL and min A +2 for toilet transfer. He was able to complete short in room distance this date with min A and RW with chair follow. Given current status, recommend HHOT to support safety, BADL engagement, and risk of falls. Pt is adamant about refusing SNF. OT will continue to follow per POC listed below.    Follow Up Recommendations  Home health OT;Supervision/Assistance - 24 hour;Other (comment) (pt refusing SNF)    Equipment Recommendations  3 in 1 bedside commode    Recommendations for Other Services       Precautions / Restrictions Precautions Precautions: Fall Restrictions Weight Bearing Restrictions: No      Mobility Bed Mobility Overal bed mobility: Needs Assistance Bed Mobility: Supine to Sit     Supine to sit: Supervision     General bed mobility comments: for safety, no physical assistance needed, HOB elevated  Transfers Overall transfer level: Needs assistance Equipment used: Rolling walker (2 wheeled) Transfers: Sit to/from  [facility] Sit to Stand: Min assist Stand pivot transfers: Min assist       General transfer comment: cueing for safe hand placement, assistance for stability and safety with transitions    Balance Overall balance assessment: Needs assistance Sitting-balance support: Feet supported Sitting balance-Leahy Scale: Fair     Standing balance support: Bilateral upper extremity supported Standing balance-Leahy Scale: Poor Standing balance comment: reliant on external support                           ADL either performed or assessed with clinical judgement   ADL Overall ADL's : Needs assistance/impaired Eating/Feeding: Set up;Sitting   Grooming: Set up;Sitting   Upper Body Bathing: Minimal assistance;Sitting   Lower Body Bathing: Maximal assistance;Sitting/lateral leans;Sit to/from stand   Upper Body Dressing : Minimal assistance;Sitting   Lower Body Dressing: Total assistance;Sitting/lateral leans;Sit to/from stand Lower Body Dressing Details (indicate cue type and reason): states he does not normally wear socks/is not able to get down to his feet so he wears slip on shoes Toilet Transfer: Minimal assistance;+2 for physical assistance;+2 for safety/equipment;RW;Ambulation Toilet Transfer Details (indicate cue type and reason): assist for safety with knee buckling and decreased safety Toileting- Clothing Manipulation and Hygiene: Minimal assistance;Sit to/from stand;Sitting/lateral lean       Functional mobility during ADLs: Minimal assistance;Cueing for safety;Rolling walker       Vision Patient Visual Report: No change from baseline       Perception     Praxis      Pertinent Vitals/Pain Pain Assessment:  No/denies pain     Hand Dominance     Extremity/Trunk Assessment Upper Extremity Assessment Upper Extremity Assessment: Generalized weakness   Lower Extremity Assessment Lower Extremity Assessment: Generalized weakness        Communication Communication Communication: No difficulties   Cognition Arousal/Alertness: Awake/alert Behavior During Therapy: WFL for tasks assessed/performed Overall Cognitive Status: Impaired/Different from baseline Area of Impairment: Safety/judgement                         Safety/Judgement: Decreased awareness of safety;Decreased awareness of deficits     General Comments: needs frequent safety cues during mobility/BADL tasks   General Comments       Exercises     Shoulder Instructions      Home Living Family/patient expects to be discharged to:: Private residence Living Arrangements: Children Available Help at Discharge: Family;Available 24 hours/day Type of Home: House Home Access: Stairs to enter [employer] of Steps: 3   Home Layout: One level     Bathroom Shower/Tub: Teacher, early years/pre: Standard     Home Equipment: Cane - single point          Prior Functioning/Environment Level of Independence: Independent        Comments: reports independent with BADLs with intermittent cane use        OT Problem List: Decreased strength;Decreased knowledge of use of DME or AE;Decreased activity tolerance;Decreased cognition;Impaired balance (sitting and/or standing);Decreased safety awareness      OT Treatment/Interventions: Self-care/ADL training;Therapeutic exercise;Patient/family education;Balance training;Energy conservation;Therapeutic activities;DME and/or AE instruction    OT Goals(Current goals can be found in the care plan section) Acute Rehab OT Goals Patient Stated Goal: return to independence OT Goal Formulation: With patient Time For Goal Achievement: 03/03/20 Potential to Achieve Goals: Good  OT Frequency: Min 2X/week   Barriers to D/C:            Co-evaluation   Reason for Co-Treatment: For patient/therapist safety;To address functional/ADL transfers PT goals addressed during session:  Mobility/safety with mobility;Balance;Proper use of DME;Strengthening/ROM        AM-PAC OT "6 Clicks" Daily Activity     Outcome Measure Help from another person eating meals?: A Little Help from another person taking care of personal grooming?: A Little Help from another person toileting, which includes using toliet, bedpan, or urinal?: A Lot Help from another person bathing (including washing, rinsing, drying)?: A Lot Help from another person to put on and taking off regular upper body clothing?: A Little Help from another person to put on and taking off regular lower body clothing?: Total 6 Click Score: 14   End of Session Equipment Utilized During Treatment: Gait belt;Rolling walker Nurse Communication: Mobility status  Activity Tolerance: Patient tolerated treatment well Patient left: in chair;with call bell/phone within reach;with chair alarm set;with family/visitor present  OT Visit Diagnosis: Unsteadiness on feet (R26.81);Other abnormalities of gait and mobility (R26.89);Muscle weakness (generalized) (M62.81)                Time: 2836-6294 OT Time Calculation (min): 45 min Charges:  OT General Charges $OT Visit: 1 Visit OT Evaluation $OT Eval Moderate Complexity: [place], MSOT, OTR/L [facility] Office Number: [phone] Pager: [phone]  Zenovia Jarred 02/18/2020, 1:45 PM

## 2020-02-18 NOTE — Progress Notes (Signed)
Patient has 17th beats of V-tach,assessed him,assymptomatic.Vital signs were all normal except his room saturation was 84%.Placed on 2lpm/[place].MD made aware.

## 2020-02-18 NOTE — Evaluation (Signed)
Physical Therapy Evaluation Patient Details Name: [name] MRN: [id number] DOB: 29-May-1946 Today's Date: 02/18/2020   History of Present Illness  Pt is a 74 y/o male admitted secondary to confusion. Pt found to have a severe AKI on CKD stage IIIb, critical ill on presentation. Pt required HD during admission. PMH including but not limited to DM, HTN, CHF and CKD.    Clinical Impression  Pt presented supine in bed with HOB elevated, awake and willing to participate in therapy session. Prior to admission, pt reported that he was independent with functional mobility and ADLs with occasional use of a cane. Pt lives with his daughter and grandchildren in a single level home with a few steps to enter. At the time of evaluation, pt overall limited with mobility secondary to generalized weakness. He was able to perform bed mobility with supervision, transfers with min A and ambulated a short distance in his room with RW and min A x2 with close chair follow for safety. Pt very adamant about wanting to return home upon d/c; therefore, recommending f/u PT services with HHPT for generalized strengthening. Pt would continue to benefit from skilled physical therapy services at this time while admitted and after d/c to address the below listed limitations in order to improve overall safety and independence with functional mobility.     Follow Up Recommendations Home health PT;Supervision/Assistance - 24 hour;Other (comment) (pt adamantly refusing SNF)    Equipment Recommendations  None recommended by PT    Recommendations for Other Services       Precautions / Restrictions Precautions Precautions: Fall Restrictions Weight Bearing Restrictions: No      Mobility  Bed Mobility Overal bed mobility: Needs Assistance Bed Mobility: Supine to Sit     Supine to sit: Supervision     General bed mobility comments: for safety, no physical assistance needed, HOB elevated  Transfers Overall  transfer level: Needs assistance Equipment used: Rolling walker (2 wheeled) Transfers: Sit to/from [facility] Sit to Stand: Min assist Stand pivot transfers: Min assist       General transfer comment: cueing for safe hand placement, assistance for stability and safety with transitions  Ambulation/Gait Ambulation/Gait assistance: Min assist;+2 safety/equipment (close chair follow ) Gait Distance (Feet): 50 Feet ((10' x2 +30' x1)) Assistive device: Rolling walker (2 wheeled) Gait Pattern/deviations: Step-through pattern;Decreased step length - right;Decreased step length - left;Decreased stride length;Trunk flexed Gait velocity: reduced   General Gait Details: pt with slow, cautious gait with RW and min A with very close chair follow as pt reporting his legs felt very weak and upon initial standing his knees buckled and he needed to sit back down on EOB  Stairs            Wheelchair Mobility    Modified Rankin (Stroke Patients Only)       Balance Overall balance assessment: Needs assistance Sitting-balance support: Feet supported Sitting balance-Leahy Scale: Fair     Standing balance support: Bilateral upper extremity supported Standing balance-Leahy Scale: Poor                               Pertinent Vitals/Pain Pain Assessment: No/denies pain    Home Living Family/patient expects to be discharged to:: Private residence Living Arrangements: Children Available Help at Discharge: Family;Available 24 hours/day Type of Home: House Home Access: Stairs to enter   [employer] of Steps: 3 Home Layout: One level Home Equipment: Kasandra Knudsen -  single point      Prior Function Level of Independence: Independent         Comments: reports independent with BADLs with intermittent cane use     Hand Dominance        Extremity/Trunk Assessment   Upper Extremity Assessment Upper Extremity Assessment: Defer to OT evaluation     Lower Extremity Assessment Lower Extremity Assessment: Generalized weakness       Communication   Communication: No difficulties  Cognition Arousal/Alertness: Awake/alert Behavior During Therapy: WFL for tasks assessed/performed Overall Cognitive Status: Impaired/Different from baseline Area of Impairment: Safety/judgement                         Safety/Judgement: Decreased awareness of safety;Decreased awareness of deficits            General Comments      Exercises     Assessment/Plan    PT Assessment Patient needs continued PT services  PT Problem List Decreased strength;Decreased mobility;Decreased balance;Decreased coordination;Decreased knowledge of use of DME;Decreased safety awareness;Decreased knowledge of precautions       PT Treatment Interventions DME instruction;Gait training;Stair training;Functional mobility training;Therapeutic activities;Therapeutic exercise;Balance training;Neuromuscular re-education;Patient/family education    PT Goals (Current goals can be found in the Care Plan section)  Acute Rehab PT Goals Patient Stated Goal: to get stronger PT Goal Formulation: With patient/family Time For Goal Achievement: 03/03/20 Potential to Achieve Goals: Good    Frequency Min 3X/week   Barriers to discharge        Co-evaluation PT/OT/SLP Co-Evaluation/Treatment: Yes Reason for Co-Treatment: For patient/therapist safety;To address functional/ADL transfers PT goals addressed during session: Mobility/safety with mobility;Balance;Proper use of DME;Strengthening/ROM         AM-PAC PT "6 Clicks" Mobility  Outcome Measure Help needed turning from your back to your side while in a flat bed without using bedrails?: None Help needed moving from lying on your back to sitting on the side of a flat bed without using bedrails?: None Help needed moving to and from a bed to a chair (including a wheelchair)?: A Little Help needed standing up from  a chair using your arms (e.g., wheelchair or bedside chair)?: A Little Help needed to walk in hospital room?: A Little Help needed climbing 3-5 steps with a railing? : A Lot 6 Click Score: 19    End of Session Equipment Utilized During Treatment: Gait belt Activity Tolerance: Patient tolerated treatment well Patient left: in chair;with call bell/phone within reach;with family/visitor present Nurse Communication: Mobility status PT Visit Diagnosis: Other abnormalities of gait and mobility (R26.89);Muscle weakness (generalized) (M62.81)    Time: 3329-5188 PT Time Calculation (min) (ACUTE ONLY): 51 min   Charges:   PT Evaluation $PT Eval Moderate Complexity: 1 Mod PT Treatments $Gait Training: 8-22 mins        Anastasio Champion, DPT  Acute Rehabilitation Services Pager [phone] Office [place] 02/18/2020, 1:18 PM

## 2020-02-19 ENCOUNTER — Inpatient Hospital Stay (HOSPITAL_COMMUNITY): Payer: Medicare Other

## 2020-02-19 DIAGNOSIS — J449 Chronic obstructive pulmonary disease, unspecified: Secondary | ICD-10-CM

## 2020-02-19 DIAGNOSIS — I4891 Unspecified atrial fibrillation: Secondary | ICD-10-CM

## 2020-02-19 DIAGNOSIS — Z87891 Personal history of nicotine dependence: Secondary | ICD-10-CM

## 2020-02-19 DIAGNOSIS — N189 Chronic kidney disease, unspecified: Secondary | ICD-10-CM

## 2020-02-19 DIAGNOSIS — Z8546 Personal history of malignant neoplasm of prostate: Secondary | ICD-10-CM

## 2020-02-19 DIAGNOSIS — Z992 Dependence on renal dialysis: Secondary | ICD-10-CM

## 2020-02-19 DIAGNOSIS — E1122 Type 2 diabetes mellitus with diabetic chronic kidney disease: Secondary | ICD-10-CM

## 2020-02-19 DIAGNOSIS — I129 Hypertensive chronic kidney disease with stage 1 through stage 4 chronic kidney disease, or unspecified chronic kidney disease: Secondary | ICD-10-CM

## 2020-02-19 DIAGNOSIS — R779 Abnormality of plasma protein, unspecified: Secondary | ICD-10-CM

## 2020-02-19 DIAGNOSIS — D649 Anemia, unspecified: Secondary | ICD-10-CM

## 2020-02-19 DIAGNOSIS — Z8042 Family history of malignant neoplasm of prostate: Secondary | ICD-10-CM

## 2020-02-19 LAB — RENAL FUNCTION PANEL
Albumin: 2.6 g/dL — ABNORMAL LOW (ref 3.5–5.0)
Anion gap: 8 (ref 5–15)
BUN: 48 mg/dL — ABNORMAL HIGH (ref 8–23)
CO2: 25 mmol/L (ref 22–32)
Calcium: 8.7 mg/dL — ABNORMAL LOW (ref 8.9–10.3)
Chloride: 105 mmol/L (ref 98–111)
Creatinine, Ser: 3.35 mg/dL — ABNORMAL HIGH (ref 0.61–1.24)
GFR calc Af Amer: 20 mL/min — ABNORMAL LOW (ref >60)
GFR calc non Af Amer: 17 mL/min — ABNORMAL LOW (ref >60)
Glucose, Bld: 101 mg/dL — ABNORMAL HIGH (ref 70–99)
Phosphorus: 3.1 mg/dL (ref 2.5–4.6)
Potassium: 3.7 mmol/L (ref 3.5–5.1)
Sodium: 138 mmol/L (ref 135–145)

## 2020-02-19 LAB — GLUCOSE, CAPILLARY
Glucose-Capillary: 93 mg/dL (ref 70–99)
Glucose-Capillary: 96 mg/dL (ref 70–99)
Glucose-Capillary: 113 mg/dL — ABNORMAL HIGH (ref 70–99)
Glucose-Capillary: 115 mg/dL — ABNORMAL HIGH (ref 70–99)

## 2020-02-19 LAB — LACTATE DEHYDROGENASE: LDH: 143 U/L (ref 98–192)

## 2020-02-19 NOTE — Consult Note (Addendum)
[place]  [phone]   HEMATOLOGY ONCOLOGY INPATIENT CONSULTATION   [name]  DOB: June 17, 1946  MR#: [id number]  CSN#: [id number]    Requesting Physician: [facility] Dr. Tyrell Antonio   Patient Care Team: Biagio Borg, MD as PCP - General Ellyn Hack Leonie Green, MD as PCP - Cardiology (Cardiology)  Reason for consult: positive SPEP, suspicion for multiple myeloma   History of present illness: Patient is a 74 year old African-American male, with past medical history of hypertension and diabetes, presented to [name] ED on July 28 with generalized weakness and poor appetite.  The work-up in the ED showed significant AKI with creatinine 11.6, hypocalcemia, metabolic acidosis.  He was seen by nephrology and started on hemodialysis.  His work-up showed positive M protein 1.2, and elevated beta light chain.  I was called to see patient for multiple myeloma work-up.  Per patient, he never had kidney disease.  His baseline creatinine was around 1.3, and went up to 1.86 on 08/26/2019 and 2.17 on 10/06/2019.  No hypercalcemia, mild anemia with hemoglobin 11 on admission.  Patient and his daughter report decreased appetite for the past 3 to 4 weeks, no weight loss.  He denies any significant bone pain.  Daughter noticed some intermittent confusion the week before admission.  He lives with his daughter, not very physically active, but able to do all his ADLs.  MEDICAL HISTORY:  Past Medical History:  Diagnosis Date  . Adenoma 05/2008  . Alcoholism in recovery ([place])   . BPH (benign prostatic hyperplasia)   . Cancer [facility])    Prostate  . Chronic LBP    Hip & Back -- Sees Dr. Maia Petties @ [facility]  . Colon polyps   . Complex renal cyst 12/2010  . Controlled type 2 diabetes mellitus with neuropathy ([place]) 01/2007  . COPD (chronic obstructive pulmonary disease) ([place])   . Diabetes ([place])   . Diverticulitis of colon   . ED (erectile dysfunction)     s/p Penile prosthesis (09/2010)  . History of prostate cancer    Dr. Karsten Ro  . History of sick sinus syndrome    reduced BB dose for Bradycardia  . HLD (hyperlipidemia)   . Hypertension   . Impaired glucose tolerance 12/21/2013  . Nephrolithiasis   . Osteoarthritis of both hips   . Osteoarthritis of lumbosacral spine    with Disc Disease  . PAF (paroxysmal atrial fibrillation) ([facility])    No longer on Amiodarone.  Not on Anticoaguation b/c no recurrence.  . Paresthesias/numbness    Bilateral LE  . Peripheral neuropathy 12/21/2013    SURGICAL HISTORY: Past Surgical History:  Procedure Laterality Date  . CARDIAC CATHETERIZATION  2003   Normal Coronary Arteries.  Marland Kitchen NM MYOVIEW LTD  03/2016   No Ischemia or Infarct - Visual EF ~60% (computer EF ~39%) - by Echo 55-60%  . PENILE PROSTHESIS IMPLANT  06/21/2012   Procedure: PENILE PROTHESIS INFLATABLE;  Surgeon: Claybon Jabs, MD;  [facility];  Service: Urology;  Laterality: N/A;  REMOVAL AND REPLACEMENT OF SOME  OF PROSTHESIS ([facility])   . PENILE PROSTHESIS PLACEMENT  09/2010  . PROSTATECTOMY    . REMOVAL OF PENILE PROSTHESIS N/A 02/15/2018   Procedure: REMOVAL OF PENILE PROSTHESIS;  Surgeon: Kathie Rhodes, MD;  Location: [facility];  Service: Urology;  Laterality: N/A;  . TRANSTHORACIC ECHOCARDIOGRAM  04/2016   a) Relatively normal EF of 55-60%. No RWMA suggesting no prior MI. Gr 2  DD (pseudo-normal filling pattern) along with moderately dilated left atrium.;; b) 02/2018: Mod LVH. EF 65-70%. No RWMA.  Unable to assess DF 2/2 Afib. Mild MR. Severe BiAtrial Enlargement. High CVP.    SOCIAL HISTORY: Social History   Socioeconomic History  . Marital status: Single    Spouse name: Not on file  . Number of children: 3  . Years of education: Not on file  . Highest education level: Not on file  Occupational History  . Occupation: Retired  Tobacco Use  . Smoking status: Former Smoker    Types: Cigarettes    Quit date:  11/04/1982    Years since quitting: 37.3  . Smokeless tobacco: Never Used  Vaping Use  . Vaping Use: Never used  Substance and Sexual Activity  . Alcohol use: Yes    Comment: no alcohol x 1 week wants to stop  . Drug use: No    Types: Marijuana    Comment: use to smoke marijuana  . Sexual activity: Not on file  Other Topics Concern  . 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   Divorced. 3 grown children. 4 GC.     Quit smoking ~20+ yrs ago.    Social Determinants of Health   Financial Resource Strain:   . Difficulty of Paying Living Expenses:   Food Insecurity:   . Worried About Charity fundraiser in the Last Year:   . Arboriculturist in the Last Year:   Transportation Needs:   . Film/video editor (Medical):   Marland Kitchen Lack of Transportation (Non-Medical):   Physical Activity:   . Days of Exercise per Week:   . Minutes of Exercise per Session:   Stress:   . Feeling of Stress :   Social Connections:   . Frequency of Communication with Friends and Family:   . Frequency of Social Gatherings with Friends and Family:   . Attends Religious Services:   . Active Member of Clubs or Organizations:   . Attends Archivist Meetings:   Marland Kitchen Marital Status:   Intimate Partner Violence:   . Fear of Current or Ex-Partner:   . Emotionally Abused:   Marland Kitchen Physically Abused:   . Sexually Abused:     FAMILY HISTORY: Family History  Problem Relation Age of Onset  . Coronary artery disease Mother   . Heart attack Mother   . Coronary artery disease Father   . Prostate cancer Father   . Diabetes Father     ALLERGIES:  is allergic to ciprofloxacin.  MEDICATIONS:  Current Facility-Administered Medications  Medication Dose Route Frequency Provider Last Rate Last Admin  . (feeding supplement) PROSource Plus liquid 30 mL  30 mL Oral BID BM Halford Chessman, Vineet, MD   30 mL at 02/17/20 1731  . apixaban (ELIQUIS)  tablet 5 mg  5 mg Oral BID Chesley Mires, MD   5 mg at 02/19/20 1122  . capsicum (ZOSTRIX) 0.075 % cream   Topical BID Regalado, Belkys A, MD   Given at 02/18/20 1755  . Chlorhexidine Gluconate Cloth 2 % PADS 6 each  6 each Topical Q0600 Chesley Mires, MD   6 each at 02/19/20 0701  . docusate sodium (COLACE) capsule 100 mg  100 mg Oral BID PRN Chesley Mires, MD      . feeding supplement (NEPRO CARB STEADY) liquid 237 mL  237 mL Oral BID BM Sood, Vineet,  MD   237 mL at 02/17/20 1735  . insulin aspart (novoLOG) injection 0-5 Units  0-5 Units Subcutaneous QHS Sood, Vineet, MD      . insulin aspart (novoLOG) injection 0-6 Units  0-6 Units Subcutaneous TID WC Sood, Vineet, MD      . lip balm (CARMEX) ointment   Topical PRN Chesley Mires, MD      . metoprolol tartrate (LOPRESSOR) tablet 25 mg  25 mg Oral BID Regalado, Belkys A, MD   25 mg at 02/19/20 1122  . multivitamin (RENA-VIT) tablet 1 tablet  1 tablet Oral QHS Chesley Mires, MD   1 tablet at 02/18/20 2245  . Muscle Rub CREA   Topical PRN Chesley Mires, MD      . polyethylene glycol (MIRALAX / GLYCOLAX) packet 17 g  17 g Oral Daily PRN Chesley Mires, MD      . pravastatin (PRAVACHOL) tablet 40 mg  40 mg Oral Daily Chesley Mires, MD   40 mg at 02/19/20 1122  . sodium chloride flush (NS) 0.9 % injection 10-40 mL  10-40 mL Intracatheter Q12H Chesley Mires, MD   10 mL at 02/16/20 1118  . sodium chloride flush (NS) 0.9 % injection 10-40 mL  10-40 mL Intracatheter PRN Chesley Mires, MD        REVIEW OF SYSTEMS:   Constitutional: Denies fevers, chills or abnormal night sweats Eyes: Denies blurriness of vision, double vision or watery eyes Ears, nose, mouth, throat, and face: Denies mucositis or sore throat Respiratory: Denies cough, dyspnea or wheezes Cardiovascular: Denies palpitation, chest discomfort or lower extremity swelling Gastrointestinal:  Denies nausea, heartburn or change in bowel habits Skin: Denies abnormal skin rashes Lymphatics: Denies new  lymphadenopathy or easy bruising Neurological:Denies numbness, tingling or new weaknesses Behavioral/Psych: Mood is stable, no new changes  All other systems were reviewed with the patient and are negative.  PHYSICAL EXAMINATION: ECOG PERFORMANCE STATUS: 3 - Symptomatic, >50% confined to bed  Vitals:   02/19/20 0457 02/19/20 0925  BP: 122/75 112/69  Pulse: 79 66  Resp: 18 18  Temp: 98.1 F (36.7 C) 98.7 F (37.1 C)  SpO2: 100% 100%   Filed Weights   02/15/20 1357 02/16/20 0054  Weight: (!) 270 lb (122.5 kg) (!) 270 lb 1 oz (122.5 kg)    GENERAL:alert, no distress and comfortable SKIN: skin color, texture, turgor are normal, no rashes or significant lesions EYES: normal, conjunctiva are pink and non-injected, sclera clear OROPHARYNX:no exudate, no erythema and lips, buccal mucosa, and tongue normal  NECK: supple, thyroid normal size, non-tender, without nodularity LYMPH:  no palpable lymphadenopathy in the cervical, axillary or inguinal LUNGS: clear to auscultation and percussion with normal breathing effort HEART: regular rate & rhythm and no murmurs and no lower extremity edema ABDOMEN:abdomen soft, non-tender and normal bowel sounds Musculoskeletal:no cyanosis of digits and no clubbing  PSYCH: alert & oriented x 3 with fluent speech NEURO: no focal motor/sensory deficits  LABORATORY DATA:  I have reviewed the data as listed Lab Results  Component Value Date   WBC 4.7 02/17/2020   HGB 9.8 (L) 02/17/2020   HCT 29.8 (L) 02/17/2020   MCV 84.4 02/17/2020   PLT  02/17/2020    PLATELET CLUMPS NOTED ON SMEAR, COUNT APPEARS DECREASED   Recent Labs    10/06/19 1636 10/06/19 1636 02/15/20 1747 02/15/20 2011 02/17/20 0421 02/17/20 1241 02/18/20 0420 02/18/20 1730 02/19/20 0406  NA 132*   < > 133*   < > 138   < >  138 138 138  K 5.3*   < > >7.5*   < > 4.2   < > 3.8 3.9 3.7  CL 101   < > 111   < > 103   < > 106 105 105  CO2 22   < > 9*   < > 26   < > _0 GLUCOSE  108*   < > 88   < > 98   < > 103* 142* 101*  BUN 49*   < > 168*   < > 52*   < > 54* 53* 48*  CREATININE 2.17*   < > 11.66*   < > 4.20*   < > 4.10* 3.86* 3.35*  CALCIUM 9.3   < > 9.2   < > 8.4*   < > 8.6* 8.7* 8.7*  GFRNONAA  --   --  4*   < > 13*   < > 14* 15* 17*  GFRAA  --   --  4*   < > 15*   < > 16* 17* 20*  PROT 8.5*  --  7.7  --   --   --   --   --   --   ALBUMIN 3.6   < > 3.3*   < > 2.7*  --  2.6*  --  2.6*  AST 16  --  13*  --   --   --   --   --   --   ALT 8  --  9  --   --   --   --   --   --   ALKPHOS 95  --  64  --   --   --   --   --   --   BILITOT 0.5  --  0.6  --   --   --   --   --   --   BILIDIR 0.0  --   --   --   --   --   --   --   --    < > = values in this interval not displayed.    RADIOGRAPHIC STUDIES: I have personally reviewed the radiological images as listed and agreed with the findings in the report. CT Head Wo Contrast  Result Date: 02/15/2020 CLINICAL DATA:  Mental status change EXAM: CT HEAD WITHOUT CONTRAST TECHNIQUE: Contiguous axial images were obtained from the base of the skull through the vertex without intravenous contrast. COMPARISON:  None. FINDINGS: Brain: No hemorrhage or intracranial mass is visualized. Small focus of encephalomalacia in the left occipital lobe consistent with chronic infarct. Moderate atrophy. Moderate hypodensity in the white matter likely chronic small vessel ischemic change. Prominent ventricles felt secondary to atrophy. Vascular: No hyperdense vessels.  Carotid vascular calcification Skull: Normal. Negative for fracture or focal lesion. Sinuses/Orbits: Mild mucosal thickening in the ethmoid sinuses Other: None IMPRESSION: 1. No CT evidence for acute intracranial abnormality. 2. Atrophy and small vessel ischemic changes of the white matter. Chronic left occipital lobe infarct. Electronically Signed   By: Donavan Foil M.D.   On: 02/15/2020 16:09   US RENAL  Result Date: 02/15/2020 CLINICAL DATA:  Acute kidney injury. EXAM:  RENAL / URINARY TRACT ULTRASOUND COMPLETE COMPARISON:  Abdominal CT 05/27/2018. Remote abdominal MRI 01/27/2011 FINDINGS: Right Kidney: Renal measurements: 12.9 x 6.8 x 5.0 cm = volume: 228 mL. Increased renal parenchymal echogenicity. No hydronephrosis. Cyst in the mid kidney measures 2.9 x 2.3  x 1.9 cm. Additional cysts on prior CT are not well characterized. No evidence of solid lesion. Previous stones on CT are not well seen by ultrasound. Left Kidney: Renal measurements: 12.8 x 6.5 x 4.8 cm = volume: 208 mL. Increased renal parenchymal echogenicity. No hydronephrosis. 2 cyst in the left kidney, exophytic 8.2 x 7.4 x 7.7 cm cyst arises from the upper pole. Cyst in the lower kidney measures 3.5 x 3 x 3.1 cm. Additional cysts on CT are not well resolved by ultrasound. Previous stones on CT are not seen by ultrasound. Bladder: Appears normal for degree of bladder distention. Other: None. IMPRESSION: 1. Increased renal parenchymal echogenicity consistent with chronic medical renal disease. No obstructive uropathy. 2. Bilateral renal cysts. Many of the cysts on prior CT are not well-defined by ultrasound. 3. Renal stones on prior exam are not demonstrated. Electronically Signed   By: Keith Rake M.D.   On: 02/15/2020 21:39   DG CHEST PORT 1 VIEW  Result Date: 02/18/2020 CLINICAL DATA:  Hypoxemia EXAM: PORTABLE CHEST 1 VIEW COMPARISON:  Radiograph 02/16/2020, CT 01/10/2011 FINDINGS: Dual lumen left IJ approach central venous catheter tip terminates in the lower SVC beyond the brachiocephalic-caval confluence. Additional support devices overlie the chest. Some atelectatic changes in the lungs which are otherwise clear. Stable cardiomediastinal contours with a calcified aorta. No acute osseous or soft tissue abnormality. Degenerative changes are present in the imaged spine and shoulders. IMPRESSION: Stable appearance of the left IJ approach central venous catheter. Mild atelectasis, otherwise no acute  cardiopulmonary process. Electronically Signed   By: Lovena Le M.D.   On: 02/18/2020 19:18   DG Chest Port 1 View  Result Date: 02/16/2020 CLINICAL DATA:  Respiratory failure. EXAM: PORTABLE CHEST 1 VIEW COMPARISON:  02/15/2020.  09/08/2014. FINDINGS: Left IJ line stable position. Heart size stable. Left base atelectasis/scarring again noted. Mild left base infiltrate cannot be excluded. Stable elevation left hemidiaphragm. No pleural effusion or pneumothorax. And thoracic spine. IMPRESSION: 1.  Left IJ line stable position. 2. Left base atelectasis/scarring again noted. Mild left base infiltrate cannot be excluded. Stable elevation left hemidiaphragm. Electronically Signed   By: Marcello Moores  Register   On: 02/16/2020 05:54   DG CHEST PORT 1 VIEW  Result Date: 02/15/2020 CLINICAL DATA:  Follow-up central line placement EXAM: PORTABLE CHEST 1 VIEW COMPARISON:  02/15/2020 FINDINGS: Cardiac shadow is enlarged but stable. Left jugular central line is noted in the mid superior vena cava. No pneumothorax is noted. No infiltrate or effusion is noted. IMPRESSION: No pneumothorax following central line placement. Electronically Signed   By: Inez Catalina M.D.   On: 02/15/2020 21:06   DG Chest Portable 1 View  Result Date: 02/15/2020 CLINICAL DATA:  Generalized weakness. EXAM: PORTABLE CHEST 1 VIEW COMPARISON:  02/25/2018 FINDINGS: Heart size mildly enlarged. Vascularity normal. Elevated left hemidiaphragm with mild left lower lobe atelectasis. Negative for pneumonia or effusion. IMPRESSION: Mild left lower lobe atelectasis.  Negative for heart failure. Electronically Signed   By: Franchot Gallo M.D.   On: 02/15/2020 15:14   ECHOCARDIOGRAM COMPLETE  Result Date: 02/17/2020    ECHOCARDIOGRAM REPORT   Patient Name:   [name] Date of Exam: 02/17/2020 Medical Rec #:  [id number]           Height:       73.0 in Accession #:    [id number]          Weight:       270.1 lb Date of Birth:  [birth year]  BSA:           2.445 m Patient Age:    34 years            BP:           119/55 mmHg Patient Gender: M                   HR:           72 bpm. Exam Location:  Inpatient Procedure: 2D Echo, Cardiac Doppler and Color Doppler Indications:    R94.31 Abnormal EKG  History:        Patient has prior history of Echocardiogram examinations, most                 recent 02/25/2018. COPD, Arrythmias:Atrial Fibrillation; Risk                 Factors:Hypertension, Diabetes and Dyslipidemia. Cancer. Alcohol                 use.  Sonographer:    Tiffany Dance Referring Phys: [id number] BELKYS A REGALADO IMPRESSIONS  1. Left ventricular ejection fraction, by estimation, is 60 to 65%. The left ventricle has normal function. The left ventricle has no regional wall motion abnormalities. There is mild concentric left ventricular hypertrophy. Left ventricular diastolic parameters are indeterminate due to underlying atrial fibrillation.  2. Right ventricular systolic function is normal. The right ventricular size is normal. There is normal pulmonary artery systolic pressure. The estimated right ventricular systolic pressure is 19.3 mmHg.  3. Left atrial size was mildly dilated.  4. The mitral valve is normal in structure. Trivial mitral valve regurgitation. No evidence of mitral stenosis.  5. The aortic valve is tricuspid. Aortic valve regurgitation is not visualized. Mild aortic valve sclerosis is present, with no evidence of aortic valve stenosis.  6. Aortic dilatation noted. There is mild dilatation of the aortic root and of the ascending aorta measuring 40 mm and 66m respectively.  7. The inferior vena cava is normal in size with greater than 50% respiratory variability, suggesting right atrial pressure of 3 mmHg. FINDINGS  Left Ventricle: Left ventricular ejection fraction, by estimation, is 60 to 65%. The left ventricle has normal function. The left ventricle has no regional wall motion abnormalities. The left ventricular internal cavity size  was normal in size. There is  mild concentric left ventricular hypertrophy. Left ventricular diastolic parameters are indeterminate. Normal left ventricular filling pressure. Right Ventricle: The right ventricular size is normal. No increase in right ventricular wall thickness. Right ventricular systolic function is normal. There is normal pulmonary artery systolic pressure. The tricuspid regurgitant velocity is 2.55 m/s, and  with an assumed right atrial pressure of 3 mmHg, the estimated right ventricular systolic pressure is 279.0mmHg. Left Atrium: Left atrial size was mildly dilated. Right Atrium: Right atrial size was normal in size. Pericardium: There is no evidence of pericardial effusion. Mitral Valve: The mitral valve is normal in structure. Normal mobility of the mitral valve leaflets. Trivial mitral valve regurgitation. No evidence of mitral valve stenosis. Tricuspid Valve: The tricuspid valve is normal in structure. Tricuspid valve regurgitation is trivial. No evidence of tricuspid stenosis. Aortic Valve: The aortic valve is tricuspid. Aortic valve regurgitation is not visualized. Mild aortic valve sclerosis is present, with no evidence of aortic valve stenosis. Pulmonic Valve: The pulmonic valve was normal in structure. Pulmonic valve regurgitation is not visualized. No evidence of pulmonic stenosis. Aorta: Aortic dilatation noted. There is mild  dilatation of the aortic root and of the ascending aorta measuring 40 mm. Venous: The inferior vena cava is normal in size with greater than 50% respiratory variability, suggesting right atrial pressure of 3 mmHg. IAS/Shunts: No atrial level shunt detected by color flow Doppler.  LEFT VENTRICLE PLAX 2D LVIDd:         4.80 cm LVIDs:         3.20 cm LV PW:         1.30 cm LV IVS:        1.20 cm LVOT diam:     2.30 cm LV SV:         71 LV SV Index:   29 LVOT Area:     4.15 cm  RIGHT VENTRICLE          IVC RV Basal diam:  2.50 cm  IVC diam: 1.80 cm TAPSE (M-mode):  1.2 cm LEFT ATRIUM              Index       RIGHT ATRIUM           Index LA diam:        4.90 cm  2.00 cm/m  RA Area:     18.60 cm LA Vol (A2C):   119.0 ml 48.68 ml/m RA Volume:   48.20 ml  19.72 ml/m LA Vol (A4C):   75.2 ml  30.76 ml/m LA Biplane Vol: 95.8 ml  39.19 ml/m  AORTIC VALVE LVOT Vmax:   112.50 cm/s LVOT Vmean:  73.000 cm/s LVOT VTI:    0.171 m  AORTA Ao Root diam: 4.00 cm Ao Asc diam:  3.80 cm MITRAL VALVE               TRICUSPID VALVE MV Area (PHT): 3.38 cm    TR Peak grad:   26.0 mmHg MV Decel Time: 225 msec    TR Vmax:        255.00 cm/s MV E velocity: 65.20 cm/s                            SHUNTS                            Systemic VTI:  0.17 m                            Systemic Diam: 2.30 cm Fransico Him MD Electronically signed by Fransico Him MD Signature Date/Time: 02/17/2020/3:11:41 PM    Final     ASSESSMENT & PLAN:  74 yo AAM with PMH of hypertension, diabetes, COPD, remote history of prostate cancer, presented with weakness and anorexia.  Lab showed acute renal failure required hemodialysis.  1. Positive SPEP with M protein 1.2g/dl, with significantly elevated lambda light chain, concerning for multiple myeloma, in the setting of acute renal failure 2. AKI with baseline CKD since early 2021 3. Mild anemia 4. Metabolic acidosis and hyperkalemia, resolved 5.  Hypertension 6.  Type 2 diabetes 7.  Right lower extremity leg wound 8.  Atrial fibrillation, on Eliquis 9.  History of prostate cancer status post prostatectomy in 2007  Recommendations: -My suspicion for multiple myeloma is high given the acute onset of his renal failure, I recommend bone marrow biopsy.  I discussed with patient and his 2 daughters in details.  Patient was very reluctant, scared of any  procedures.  We reviewed the natural course of multiple myeloma, treatment options, etc. After lengthy discussion, he finally agreed -I placed order for IR bone marrow biopsy.  Patient is I request, need to held  dose before biopsy -I will also get a bone survey, ordered.  -will also get 24h urine for UPEP/IFE -will order serum Ig level and IFE  -I will follow up after biopsy, or I can set up his f/u with me a few days after biopsy  -all questions were answered.  -I appreciate the opportunity to participate in his care.  All questions were answered. The patient knows to call the clinic with any problems, questions or concerns. I spent a total of 60 mins for the consult today       Truitt Merle, MD 02/19/2020 1:07 PM

## 2020-02-19 NOTE — Plan of Care (Signed)
  Problem: Clinical Measurements: Goal: Diagnostic test results will improve Outcome: Progressing   

## 2020-02-19 NOTE — Progress Notes (Signed)
Order received to remove temporary HD catheter per Dr. Joelyn Oms. Procedure explained to patient. Dressing and sutures removed without difficulty. Vaseline gauze and two 2x2 used to apply pressure over insertion site. Patient instructed to take deep breath/perform Valsalva maneuver, catheter removed, and pressure applied for 19 minutes. Hemostasis achieved, two 4x4 gauze applied over other pressure gauze and medipore tape applied in a pressure dressing fashion. All questions answered by patient and needs met.  Gwenlyn Perking, RN informed of procedure.

## 2020-02-19 NOTE — Progress Notes (Signed)
PROGRESS NOTE    [name]  IDH:[id number] DOB: 04-15-1946 DOA: 02/15/2020 PCP: Biagio Borg, MD   Brief Narrative: 74 year old with past medical history significant for diabetes, hypertension COPD, CKD stage IIIb (cr 1.3--2.1), A. fib who presents with AKI, metabolic acidosis and hyperkalemia.  Patient was initiated on hemodialysis this admission.  Patient presented to [facility] ED 7/28 with generalized weakness for a few days and altered mental status.    Assessment & Plan:   Active Problems:   AKI (acute kidney injury) ([place])   Pressure injury of skin  1-Severe AKI on CKD stage IIIb; Critical ill on presentation; -Prior Cr range (1.3--2.1) -Presented with metabolic acidosis CO2 9, hyperkalemia potassium 7.5, 168, creatinine 11 -Patient received IV calcium, insulin, dextrose, IV bicarb drip in the ED. -Started on CRRT on admission.  -He has had HD> day 2.  -SPEP, SFLC pending.  -nephrology suspect subacute decline vs acute component from med (NSAID, ACE, Diuretic) -Urine out put 700 cc, Cr down to 3.8--3.3 -plan to remove HD catheter today.   2-M spike; plan to proceed with bone survey and probably bone marrow biopsy.  Dr Burr Medico consulted.   Metabolic acidosis: CO2 8, pH; 7.1 Improved with bicarb gtt and HD>  Resolved.   3-Acute metabolic encephalopathy: Multifactorial related to acidosis and uremia: CT head negative. Improved. Alert and oriented. Following command.   4-History of paroxysmal A. fib, hypertension hyperlipidemia He was transiently on heparin now back on Eliquis   Diabetes: Continue with a sliding scale insulin. -Hold metformin. Need to discontinue Metformin at discharge as well.  History of COPD: Continue with nebulizer  History of left lower extremity wound, Receiving hyperbaric oxygen therapy as an outpatient. Care consulted  Asymptomatic, runs VT; 6 runs.  Replete MG IV.  Metoprolol increase to 25 mg BID Troponin mild elevated, per  cardiology non specific. ECHO normal.  Follow up with cardiology outpatient.   Acute hypoxic Respiratory  failure Related to atelectasis./  Incentive spirometry On 2 L  Already on blood thinner.    LE wound;  Wound care consulted.   Pressure Injury 02/16/20 Sacrum Bilateral Stage 2 -  Partial thickness loss of dermis presenting as a shallow open injury with a red, pink wound bed without slough. (Active)  02/16/20 0400  Location: Sacrum  Location Orientation: Bilateral  Staging: Stage 2 -  Partial thickness loss of dermis presenting as a shallow open injury with a red, pink wound bed without slough.  Wound Description (Comments):   Present on Admission: Yes     Pressure Injury 02/16/20 Leg Right Deep Tissue Pressure Injury - Purple or maroon localized area of discolored intact skin or blood-filled blister due to damage of underlying soft tissue from pressure and/or shear. (Active)  02/16/20   Location: Leg  Location Orientation: Right  Staging: Deep Tissue Pressure Injury - Purple or maroon localized area of discolored intact skin or blood-filled blister due to damage of underlying soft tissue from pressure and/or shear.  Wound Description (Comments):   Present on Admission: Yes     Pressure Injury 02/16/20 Right Stage 2 -  Partial thickness loss of dermis presenting as a shallow open injury with a red, pink wound bed without slough. (Active)  02/16/20 2200  Location:   Location Orientation: Right  Staging: Stage 2 -  Partial thickness loss of dermis presenting as a shallow open injury with a red, pink wound bed without slough.  Wound Description (Comments):   Present on Admission: Yes  Pressure Injury 02/16/20 Buttocks Right;Lateral Stage 2 -  Partial thickness loss of dermis presenting as a shallow open injury with a red, pink wound bed without slough. (Active)  02/16/20 2200  Location: Buttocks  Location Orientation: Right;Lateral  Staging: Stage 2 -  Partial thickness  loss of dermis presenting as a shallow open injury with a red, pink wound bed without slough.  Wound Description (Comments):   Present on Admission: Yes     Nutrition Problem: Increased nutrient needs Etiology: chronic illness (CHF, COPD)    Signs/Symptoms: estimated needs    Interventions: MVI, Nepro shake, Liberalize Diet  Estimated body mass index is 35.63 kg/m as calculated from the following:   Height as of this encounter: _0  (1.854 m).   Weight as of this encounter: 122.5 kg.   DVT prophylaxis: Eliquis Code Status: Full code Family Communication: Daughters at bedside Disposition Plan:  Status is: Inpatient  Remains inpatient appropriate because:Persistent severe electrolyte disturbances   Dispo: The patient is from: Home              Anticipated d/c is to: to be determine              Anticipated d/c date is: 3 days              Patient currently is not medically stable to d/c. needs to determine ESRD and if requirement of HD .         Consultants:   Nephrology  CCM  Procedures:  CRRT HD Antimicrobials:    Subjective: He is feeling better, no significant cough.  He would like HD catheter removed today    Objective: Vitals:   02/18/20 1645 02/18/20 2032 02/19/20 0457 02/19/20 0925  BP: 121/69 114/65 122/75 112/69  Pulse: 85 69 79 66  Resp: _1 Temp: 98.5 F (36.9 C) 98.3 F (36.8 C) 98.1 F (36.7 C) 98.7 F (37.1 C)  TempSrc: Oral   Oral  SpO2: 100% 100% 100% 100%  Weight:      Height:        Intake/Output Summary (Last 24 hours) at 02/19/2020 1452 Last data filed at 02/19/2020 1300 Gross per 24 hour  Intake 360 ml  Output 500 ml  Net -140 ml   Filed Weights   02/15/20 1357 02/16/20 0054  Weight: (!) 122.5 kg (!) 122.5 kg    Examination:  General exam: NAD Respiratory system: CTA Cardiovascular system: S 1, S 2 RRR Gastrointestinal system: BBS present, soft, nt Central nervous system: Non focal.  Extremities:  Symmetric power.    Data Reviewed: I have personally reviewed following labs and imaging studies  CBC: Recent Labs  Lab 02/15/20 1430 02/15/20 2011 02/16/20 0441 02/17/20 0421  WBC 6.2  --  4.8 4.7  NEUTROABS 4.9  --   --   --   HGB 11.0* 11.9* 10.5* 9.8*  HCT 35.4* 35.0* 32.3* 29.8*  MCV 90.5  --  85.2 84.4  PLT PLATELET CLUMPS NOTED ON SMEAR, UNABLE TO ESTIMATE  --  124* PLATELET CLUMPS NOTED ON SMEAR, COUNT APPEARS DECREASED   Basic Metabolic Panel: Recent Labs  Lab 02/16/20 0109 02/16/20 0109 02/16/20 0441 02/16/20 1222 02/17/20 0421 02/17/20 1241 02/18/20 0420 02/18/20 1730 02/19/20 0406  NA 135   < > 138   < > 138 137 138 138 138  K 6.5*   < > 4.6   < > 4.2 3.8 3.8 3.9 3.7  CL 113*   < >  107   < > 103 105 106 105 105  CO2 10*   < > 18*   < > _0 GLUCOSE 127*   < > 90   < > 98 126* 103* 142* 101*  BUN 153*   < > 100*   < > 52* 52* 54* 53* 48*  CREATININE 10.42*   < > 7.06*   < > 4.20* 4.32* 4.10* 3.86* 3.35*  CALCIUM 9.1   < > 9.0   < > 8.4* 8.4* 8.6* 8.7* 8.7*  MG 2.4  --   --   --  1.8  --   --  2.0  --   PHOS 5.8*  --  4.1  --  3.3  --  3.4  --  3.1   < > = values in this interval not displayed.   GFR: Estimated Creatinine Clearance: 26.9 mL/min (A) (by C-G formula based on SCr of 3.35 mg/dL (H)). Liver Function Tests: Recent Labs  Lab 02/15/20 1747 02/16/20 0441 02/17/20 0421 02/18/20 0420 02/19/20 0406  AST 13*  --   --   --   --   ALT 9  --   --   --   --   ALKPHOS 64  --   --   --   --   BILITOT 0.6  --   --   --   --   PROT 7.7  --   --   --   --   ALBUMIN 3.3* 3.1* 2.7* 2.6* 2.6*   No results for input(s): LIPASE, AMYLASE in the last 168 hours. No results for input(s): AMMONIA in the last 168 hours. Coagulation Profile: No results for input(s): INR, PROTIME in the last 168 hours. Cardiac Enzymes: Recent Labs  Lab 02/15/20 2230  CKTOTAL 230   BNP (last 3 results) No results for input(s): PROBNP in the last 8760 hours.  HbA1C: No results for input(s): HGBA1C in the last 72 hours. CBG: Recent Labs  Lab 02/18/20 1132 02/18/20 1640 02/18/20 2032 02/19/20 0646 02/19/20 1115  GLUCAP 117* 94 94 93 96   Lipid Profile: No results for input(s): CHOL, HDL, LDLCALC, TRIG, CHOLHDL, LDLDIRECT in the last 72 hours. Thyroid Function Tests: No results for input(s): TSH, T4TOTAL, FREET4, T3FREE, THYROIDAB in the last 72 hours. Anemia Panel: No results for input(s): VITAMINB12, FOLATE, FERRITIN, TIBC, IRON, RETICCTPCT in the last 72 hours. Sepsis Labs: No results for input(s): PROCALCITON, LATICACIDVEN in the last 168 hours.  Recent Results (from the past 240 hour(s))  SARS Coronavirus 2 by RT PCR     Status: None   Collection Time: 02/15/20  4:09 PM  Result Value Ref Range Status   SARS Coronavirus 2 NEGATIVE NEGATIVE Final    Comment: (NOTE) Result indicates the ABSENCE of SARS-CoV-2 RNA in the patient specimen.   The lowest concentration of SARS-CoV-2 viral copies this assay can detect in nasopharyngeal swab specimens is 500 copies / mL.  A negative result does not preclude SARS-CoV-2 infection and should not be used as the sole basis for patient management decisions. A negative result may occur with improper specimen collection / handling, submission of a specimen other than nasopharyngeal swab, presence of viral mutation(s) within the areas targeted by this assay, and inadequate number of viral copies (<500 copies / mL) present.  Negative results must be combined with clinical observations, patient history, and epidemiological information.   The expected result is NEGATIVE.   Patient Fact Sheet:  BlogSelections.co.uk    Provider Fact Sheet:  https://lucas.com/    This test is not yet approved or cleared by the [place] FDA and  has been [place] orized for detection and/or diagnosis of SARS-CoV-2 by FDA under an Emergency Use Authorization (EUA).   This EUA will remain in effect (meaning this test can be used) for the duration of  the COVID-19 declaration under Section 564(b)(1) of the Act, 21 U.S.C. section 360bbb-3(b)(1), unless the authorization is terminated or revoked sooner  Performed at [facility], [place] [address] [place], [place] [address]   MRSA PCR Screening     Status: None   Collection Time: 02/16/20  4:45 AM   Specimen: Nasopharyngeal  Result Value Ref Range Status   MRSA by PCR NEGATIVE NEGATIVE Final    Comment:        The GeneXpert MRSA Assay (FDA approved for NASAL specimens only), is one component of a comprehensive MRSA colonization surveillance program. It is not intended to diagnose MRSA infection nor to guide or monitor treatment for MRSA infections. Performed at [facility], [place] [address], [place], [place] [address]          Radiology Studies: DG CHEST PORT 1 VIEW  Result Date: 02/18/2020 CLINICAL DATA:  Hypoxemia EXAM: PORTABLE CHEST 1 VIEW COMPARISON:  Radiograph 02/16/2020, CT 01/10/2011 FINDINGS: Dual lumen left IJ approach central venous catheter tip terminates in the lower SVC beyond the brachiocephalic-caval confluence. Additional support devices overlie the chest. Some atelectatic changes in the lungs which are otherwise clear. Stable cardiomediastinal contours with a calcified aorta. No acute osseous or soft tissue abnormality. Degenerative changes are present in the imaged spine and shoulders. IMPRESSION: Stable appearance of the left IJ approach central venous catheter. Mild atelectasis, otherwise no acute cardiopulmonary process. Electronically Signed   By: Lovena Le M.D.   On: 02/18/2020 19:18        Scheduled Meds: . (feeding supplement) PROSource Plus  30 mL Oral BID BM  . apixaban  5 mg Oral BID  . capsicum   Topical BID  . Chlorhexidine Gluconate Cloth  6 each Topical Q0600  . feeding supplement (NEPRO CARB STEADY)  237 mL Oral BID BM  . insulin aspart   0-5 Units Subcutaneous QHS  . insulin aspart  0-6 Units Subcutaneous TID WC  . metoprolol tartrate  25 mg Oral BID  . multivitamin  1 tablet Oral QHS  . pravastatin  40 mg Oral Daily  . sodium chloride flush  10-40 mL Intracatheter Q12H   Continuous Infusions:   LOS: 4 days    Time spent: 35 minutes.     Elmarie Shiley, MD [facility]   If 7PM-7AM, please contact night-coverage [email or web address]  02/19/2020, 2:52 PM

## 2020-02-19 NOTE — Progress Notes (Signed)
Nurse explained the purpose Incentive spirometer.Taught the patient how to use.Patient able to return demo very well.Encourage patient to use it every now and then as he tolerated and progress on it.Will monitor.

## 2020-02-19 NOTE — Progress Notes (Signed)
Dr. Harl Bowie requests f/u 3 weeks - I have sent a message to our office's scheduling team requesting a follow-up appointment, and our office will call the patient with this information.  Tiziana Cislo PA-C

## 2020-02-19 NOTE — Progress Notes (Signed)
    Dr Deloris Ping consult reviewed. Short runs of NSVT in setting of structurally normal heart with normal cardiac function. Telemetry review shows infrequent short runs NSVT up to 3 beats, no extended episodes. Continue beta blocker, no further cardiac interventions planned at this time, can titrate beta blocker further if needed. We will arrange outpatient f/u, we will sign off inpatient care.     For questions or updates, please contact [place] Please consult [email or web address] for contact info under        Signed, Carlyle Dolly, MD  02/19/2020, 9:44 AM

## 2020-02-19 NOTE — Plan of Care (Signed)
  Problem: Safety: Goal: Ability to remain free from injury will improve Outcome: Progressing   

## 2020-02-19 NOTE — Progress Notes (Addendum)
Admit: 02/15/2020 LOS: 4  25M severe renal failure, hyperkalemia, acidosis  Subjective:  . Serum creatinine improved this morning, BUN stable, made 0.7 L of urine overnight, K3.7 . Feels stronger, appetite picking up, looks better . Daughter at bedside, updated . SPEP with small M spike, kappa lambda ratio 0.2 on SFLC  07/31 0701 - 08/01 0700 In: 360 [P.O.:360] Out: 700 [Urine:700]  Filed Weights   02/15/20 1357 02/16/20 0054  Weight: (!) 122.5 kg (!) 122.5 kg    Scheduled Meds: . (feeding supplement) PROSource Plus  30 mL Oral BID BM  . apixaban  5 mg Oral BID  . capsicum   Topical BID  . Chlorhexidine Gluconate Cloth  6 each Topical Q0600  . feeding supplement (NEPRO CARB STEADY)  237 mL Oral BID BM  . insulin aspart  0-5 Units Subcutaneous QHS  . insulin aspart  0-6 Units Subcutaneous TID WC  . metoprolol tartrate  25 mg Oral BID  . multivitamin  1 tablet Oral QHS  . pravastatin  40 mg Oral Daily  . sodium chloride flush  10-40 mL Intracatheter Q12H   Continuous Infusions:  PRN Meds:.docusate sodium, lip balm, Muscle Rub, polyethylene glycol, sodium chloride flush  Current Labs: reviewed  Results for [name], [name] (MRN [id number]) as of 02/18/2020 11:00  Ref. Range 02/16/2020 12:22  Kappa, lamda light chain ratio Latest Ref Range: 0.26 - 1.65  0.23 (L)    Physical Exam:  Blood pressure 112/69, pulse 66, temperature 98.7 F (37.1 C), temperature source Oral, resp. rate 18, height 6\' 1"  (1.854 m), weight (!) 122.5 kg, SpO2 100 %. Stronger appearing RLE bandaged LLE trace edema RRR ctab Coarse bs b/l S/nt/nd Nonfocal  A 1. AKI with baseline CKD3 (but some worsenign earlier this year), now seems had significant acute component, likely driven by hypovolemia while using ACE inhibitor and NSAIDs; recovering significant GFR; no further HD needed; 1. Started HD 7/28, again 7/29; 2. No further HD needed 3. Removed temp HD cath today 4. Will need outpatient  follow-up at CKD, I will make appointment 2. SPEP with M spike, borderline abnormal ratio of serum free light chains: I doubt this is because of his acute renal failure; will need involvement of oncology, TRH will discuss with oncology today; family updated today 3. Hyperkalemia, severe, resolved 4. Metabolic Acidosis from #1, resolved 5. Anemia, mild stable 6. DM2 7. RLE leg wound seeing wound care 8. HfPEF 9. Hx/o AFib 10. Hx/o BPH and prostatic Ca s/p prostacectomy  P . As above . Daily weights, Daily Renal Panel, Strict I/Os, Avoid nephrotoxins (NSAIDs, judicious IV Contrast)  . Medication Issues; o Preferred narcotic agents for pain control are hydromorphone, fentanyl, and methadone. Morphine should not be used.  o Baclofen should be avoided o Avoid oral sodium phosphate and magnesium citrate based laxatives / bowel preps    Pearson Grippe MD 02/19/2020, 11:10 AM  Recent Labs  Lab 02/17/20 0421 02/17/20 1241 02/18/20 0420 02/18/20 1730 02/19/20 0406  NA 138   < > 138 138 138  K 4.2   < > 3.8 3.9 3.7  CL 103   < > 106 105 105  CO2 26   < > 23 25 25   GLUCOSE 98   < > 103* 142* 101*  BUN 52*   < > 54* 53* 48*  CREATININE 4.20*   < > 4.10* 3.86* 3.35*  CALCIUM 8.4*   < > 8.6* 8.7* 8.7*  PHOS 3.3  --  3.4  --  3.1   < > = values in this interval not displayed.   Recent Labs  Lab 02/15/20 1430 02/15/20 1430 02/15/20 2011 02/16/20 0441 02/17/20 0421  WBC 6.2  --   --  4.8 4.7  NEUTROABS 4.9  --   --   --   --   HGB 11.0*   < > 11.9* 10.5* 9.8*  HCT 35.4*   < > 35.0* 32.3* 29.8*  MCV 90.5  --   --  85.2 84.4  PLT PLATELET CLUMPS NOTED ON SMEAR, UNABLE TO ESTIMATE  --   --  124* PLATELET CLUMPS NOTED ON SMEAR, COUNT APPEARS DECREASED   < > = values in this interval not displayed.

## 2020-02-20 LAB — CBC
HCT: 32.1 % — ABNORMAL LOW (ref 39.0–52.0)
Hemoglobin: 9.8 g/dL — ABNORMAL LOW (ref 13.0–17.0)
MCH: 27.1 pg (ref 26.0–34.0)
MCHC: 30.5 g/dL (ref 30.0–36.0)
MCV: 88.9 fL (ref 80.0–100.0)
Platelets: 127 10*3/uL — ABNORMAL LOW (ref 150–400)
RBC: 3.61 MIL/uL — ABNORMAL LOW (ref 4.22–5.81)
RDW: 14.4 % (ref 11.5–15.5)
WBC: 4.6 10*3/uL (ref 4.0–10.5)
nRBC: 0 % (ref 0.0–0.2)

## 2020-02-20 LAB — RENAL FUNCTION PANEL
Albumin: 2.6 g/dL — ABNORMAL LOW (ref 3.5–5.0)
Anion gap: 8 (ref 5–15)
BUN: 42 mg/dL — ABNORMAL HIGH (ref 8–23)
CO2: 25 mmol/L (ref 22–32)
Calcium: 8.5 mg/dL — ABNORMAL LOW (ref 8.9–10.3)
Chloride: 105 mmol/L (ref 98–111)
Creatinine, Ser: 2.86 mg/dL — ABNORMAL HIGH (ref 0.61–1.24)
GFR calc Af Amer: 24 mL/min — ABNORMAL LOW (ref >60)
GFR calc non Af Amer: 21 mL/min — ABNORMAL LOW (ref >60)
Glucose, Bld: 114 mg/dL — ABNORMAL HIGH (ref 70–99)
Phosphorus: 2.6 mg/dL (ref 2.5–4.6)
Potassium: 3.6 mmol/L (ref 3.5–5.1)
Sodium: 138 mmol/L (ref 135–145)

## 2020-02-20 LAB — GLUCOSE, CAPILLARY
Glucose-Capillary: 128 mg/dL — ABNORMAL HIGH (ref 70–99)
Glucose-Capillary: 94 mg/dL (ref 70–99)
Glucose-Capillary: 102 mg/dL — ABNORMAL HIGH (ref 70–99)
Glucose-Capillary: 122 mg/dL — ABNORMAL HIGH (ref 70–99)

## 2020-02-20 LAB — IGG, IGA, IGM
IgA: 116 mg/dL (ref 61–437)
IgG (Immunoglobin G), Serum: 2610 mg/dL — ABNORMAL HIGH (ref 603–1613)
IgM (Immunoglobulin M), Srm: 28 mg/dL (ref 15–143)

## 2020-02-20 NOTE — Progress Notes (Signed)
Physical Therapy Treatment Patient Details Name: [name] MRN: [id number] DOB: 1945-12-10 Today's Date: 02/20/2020    History of Present Illness Pt is a 74 y/o male admitted secondary to confusion. Pt found to have a severe AKI on CKD stage IIIb, critical ill on presentation. Pt required HD during admission. PMH including but not limited to DM, HTN, CHF and CKD.    PT Comments    Pt received in bed. Reporting fatigue and declining OOB activities. "I just want to take a nap." With encouragement, pt agreeable to exercises. He performed BLE exercises in supine. PT to continue per POC.   Follow Up Recommendations  Home health PT;Supervision/Assistance - 24 hour;Other (comment) (Pt adamantly refusing SNF.)     Equipment Recommendations  None recommended by PT    Recommendations for Other Services       Precautions / Restrictions Precautions Precautions: Fall    Mobility  Bed Mobility                  Transfers                    Ambulation/Gait                 Stairs             Wheelchair Mobility    Modified Rankin (Stroke Patients Only)       Balance                                            Cognition Arousal/Alertness: Awake/alert Behavior During Therapy: WFL for tasks assessed/performed Overall Cognitive Status: Impaired/Different from baseline Area of Impairment: Safety/judgement                         Safety/Judgement: Decreased awareness of safety;Decreased awareness of deficits            Exercises General Exercises - Lower Extremity Ankle Circles/Pumps: AROM;Both;10 reps;Supine Gluteal Sets: AROM;Both;10 reps;Supine Heel Slides: AROM;Right;Left;10 reps;Supine Hip ABduction/ADduction: AROM;Right;Left;10 reps;Supine Straight Leg Raises: AAROM;AROM;Right;Left;10 reps;Supine    General Comments        Pertinent Vitals/Pain Pain Assessment: No/denies pain    Home Living                       Prior Function            PT Goals (current goals can now be found in the care plan section) Acute Rehab PT Goals Patient Stated Goal: return to independence Progress towards PT goals: Progressing toward goals    Frequency    Min 3X/week      PT Plan Current plan remains appropriate    Co-evaluation              AM-PAC PT "6 Clicks" Mobility   Outcome Measure  Help needed turning from your back to your side while in a flat bed without using bedrails?: None Help needed moving from lying on your back to sitting on the side of a flat bed without using bedrails?: None Help needed moving to and from a bed to a chair (including a wheelchair)?: A Little Help needed standing up from a chair using your arms (e.g., wheelchair or bedside chair)?: A Little Help needed to walk in hospital room?: A Little Help needed climbing 3-5 steps with a railing? :  A Lot 6 Click Score: 19    End of Session   Activity Tolerance: Patient tolerated treatment well Patient left: in bed;with call bell/phone within reach Nurse Communication: Mobility status PT Visit Diagnosis: Other abnormalities of gait and mobility (R26.89);Muscle weakness (generalized) (M62.81)     Time: 2524-1590 PT Time Calculation (min) (ACUTE ONLY): 16 min  Charges:  $Therapeutic Exercise: 8-22 mins                     Lorrin Goodell, PT  Office # [phone] Pager [phone]    Lorriane Shire 02/20/2020, 11:58 AM

## 2020-02-20 NOTE — Progress Notes (Signed)
Occupational Therapy Treatment Patient Details Name: [name] MRN: [id number] DOB: [birth year] Today's Date: 02/20/2020    History of present illness Pt is a 74 y/o male admitted secondary to confusion. Pt found to have a severe AKI on CKD stage IIIb, critical ill on presentation. Pt required HD during admission. PMH including but not limited to DM, HTN, CHF and CKD.   OT comments  Patient supine in bed on arrival.  He is struggling with making decisions about further tests/procedures.  Had lengthy discussion with patient about his thoughts on upcoming bone marrow biopsy.  Supported patient in his thoughts and told him palliative care would be great to discuss more.  Agreeable to stand and walk in room so he could assess how he feels.  Required mod assist to stand and min to walk with RW.  Patient very down and visibly torn throughout session.  Encouraged self advocacy and for patient to ask MD his questions about the test/procedure.  Will continue to follow with OT acutely to address the deficits listed below.    Follow Up Recommendations  Home health OT;Supervision/Assistance - 24 hour;Other (comment) (refusing SNF)    Equipment Recommendations  3 in 1 bedside commode    Recommendations for Other Services Other (comment) (Palliative consult)    Precautions / Restrictions Precautions Precautions: Fall       Mobility Bed Mobility Overal bed mobility: Needs Assistance Bed Mobility: Supine to Sit;Sit to Supine     Supine to sit: Supervision Sit to supine: Supervision   General bed mobility comments: for safety, no physical assistance needed, HOB elevated  Transfers Overall transfer level: Needs assistance Equipment used: Rolling walker (2 wheeled) Transfers: Sit to/from Stand Sit to Stand: Mod assist         General transfer comment: heavy assist to power up and steady    Balance Overall balance assessment: Needs assistance Sitting-balance support: Feet  supported Sitting balance-Leahy Scale: Fair     Standing balance support: Bilateral upper extremity supported Standing balance-Leahy Scale: Poor Standing balance comment: reliant on external support                           ADL either performed or assessed with clinical judgement   ADL                                       Functional mobility during ADLs: Minimal assistance;Cueing for safety;Rolling walker General ADL Comments: Session focused on discussion of care and working on activity tolerance/mobility for safety.       Vision       Perception     Praxis      Cognition Arousal/Alertness: Awake/alert Behavior During Therapy: WFL for tasks assessed/performed Overall Cognitive Status: Impaired/Different from baseline Area of Impairment: Safety/judgement                         Safety/Judgement: Decreased awareness of safety;Decreased awareness of deficits     General Comments: patient struggling with decisions about further testing/procedures. Seeming down, discussed with him about what the procedures mean and recommended palliative consult.         Exercises Exercises: Other exercises General Exercises - Lower Extremity Ankle Circles/Pumps: AROM;Both;10 reps;Supine Gluteal Sets: AROM;Both;10 reps;Supine Heel Slides: AROM;Right;Left;10 reps;Supine Hip ABduction/ADduction: AROM;Right;Left;10 reps;Supine Straight Leg Raises: AAROM;AROM;Right;Left;10 reps;Supine Other Exercises Other Exercises: 23f  walk in room Other Exercises: EOB sit ~15 min   Shoulder Instructions       General Comments      Pertinent Vitals/ Pain       Pain Assessment: No/denies pain  Home Living                                          Prior Functioning/Environment              Frequency  Min 2X/week        Progress Toward Goals  OT Goals(current goals can now be found in the care plan section)  Progress towards  OT goals: Progressing toward goals  Acute Rehab OT Goals Patient Stated Goal: return to independence OT Goal Formulation: With patient Time For Goal Achievement: 03/03/20 Potential to Achieve Goals: Good  Plan Discharge plan remains appropriate    Co-evaluation                 AM-PAC OT "6 Clicks" Daily Activity     Outcome Measure   Help from another person eating meals?: A Little Help from another person taking care of personal grooming?: A Little Help from another person toileting, which includes using toliet, bedpan, or urinal?: A Lot Help from another person bathing (including washing, rinsing, drying)?: A Lot Help from another person to put on and taking off regular upper body clothing?: A Little Help from another person to put on and taking off regular lower body clothing?: Total 6 Click Score: 14    End of Session Equipment Utilized During Treatment: Rolling walker;Gait belt  OT Visit Diagnosis: Unsteadiness on feet (R26.81);Other abnormalities of gait and mobility (R26.89);Muscle weakness (generalized) (M62.81)   Activity Tolerance Patient tolerated treatment well   Patient Left in bed;with call bell/phone within reach;with bed alarm set   Nurse Communication Mobility status        Time: 7334-4830 OT Time Calculation (min): 35 min  Charges: OT General Charges $OT Visit: 1 Visit OT Treatments $Self Care/Home Management : 8-22 mins $Therapeutic Activity: 8-22 mins  August Luz, OTR/L    Phylliss Bob 02/20/2020, 3:47 PM

## 2020-02-20 NOTE — TOC Benefit Eligibility Note (Signed)
Transition of Care [facility]) Benefit Eligibility Note    Patient Details  Name: [name] MRN: [id number] Date of Birth: [birth year]   Medication/Dose: ELIQUIS  5 MG BID  Covered?: Yes  Tier: 3 Drug  Prescription Coverage Preferred Pharmacy: [facility]  Spoke with Person/Company/Phone Number:: [facility]  @   [employer] #  [phone]  Co-Pay: $ 47.00  Prior Approval: No  Deductible: Unmet (COVERAGE GAP)  Additional Notes: [employer] : Crecencio Mc Phone Number: 02/20/2020, 4:50 PM

## 2020-02-20 NOTE — Progress Notes (Signed)
Admit: 02/15/2020 LOS: 5  51M severe renal failure, hyperkalemia, acidosis  Subjective:  . BUN and crt down but only 350 of UOP recorded  . SPEP with small M spike, kappa lambda ratio 0.2 on [facility] planning for bone marrow biopsy-  He is hesitant  08/01 0701 - 08/02 0700 In: 360 [P.O.:360] Out: 350 [Urine:350]  Filed Weights   02/15/20 1357 02/16/20 0054 02/19/20 2038  Weight: (!) 122.5 kg (!) 122.5 kg (!) 108.7 kg    Scheduled Meds: . (feeding supplement) PROSource Plus  30 mL Oral BID BM  . capsicum   Topical BID  . Chlorhexidine Gluconate Cloth  6 each Topical Q0600  . feeding supplement (NEPRO CARB STEADY)  237 mL Oral BID BM  . insulin aspart  0-5 Units Subcutaneous QHS  . insulin aspart  0-6 Units Subcutaneous TID WC  . metoprolol tartrate  25 mg Oral BID  . multivitamin  1 tablet Oral QHS  . pravastatin  40 mg Oral Daily  . sodium chloride flush  10-40 mL Intracatheter Q12H   Continuous Infusions:  PRN Meds:.docusate sodium, lip balm, Muscle Rub, polyethylene glycol, sodium chloride flush  Current Labs: reviewed  Results for Parker [name], [name] (MRN [id number]) as of 02/18/2020 11:00  Ref. Range 02/16/2020 12:22  Kappa, lamda light chain ratio Latest Ref Range: 0.26 - 1.65  0.23 (L)    Physical Exam:  Blood pressure (!) 110/59, pulse 76, temperature 99.7 F (37.6 C), temperature source Oral, resp. rate 18, height '6\' 1"'  (1.854 m), weight (!) 108.7 kg, SpO2 99 %. Stronger appearing RLE bandaged LLE trace edema RRR ctab Coarse bs b/l S/nt/nd Nonfocal  A/P 1. AKI with baseline CKD3 ( some worsening earlier this year- in March was 2.17), now seems had significant acute component, likely driven by hypovolemia while using ACE inhibitor and NSAIDs; required HD 7/28 and 7/29 but now recovered-  Removed temp cath. No further HD needed  2. Will need outpatient follow-up at CKD upon discharge 2. SPEP with M spike, borderline abnormal ratio of serum free light chains: hem  onc has seen- planning for bone marrow biopsy 3. Hyperkalemia-  resolved 4. Metabolic Acidosis from #1, resolved 5. Anemia, mild stable 6. DM2 7. RLE leg wound seeing wound care 8. HfPEF 9. Hx/o AFib 10. Hx/o BPH and prostatic Ca s/p prostacectomy     Parker [name] [name]  02/20/2020, 11:28 AM  Recent Labs  Lab 02/18/20 0420 02/18/20 0420 02/18/20 1730 02/19/20 0406 02/20/20 0645  NA 138   < > 138 138 138  K 3.8   < > 3.9 3.7 3.6  CL 106   < > 105 105 105  CO2 23   < > '25 25 25  ' GLUCOSE 103*   < > 142* 101* 114*  BUN 54*   < > 53* 48* 42*  CREATININE 4.10*   < > 3.86* 3.35* 2.86*  CALCIUM 8.6*   < > 8.7* 8.7* 8.5*  PHOS 3.4  --   --  3.1 2.6   < > = values in this interval not displayed.   Recent Labs  Lab 02/15/20 1430 02/15/20 2011 02/16/20 0441 02/17/20 0421 02/20/20 0645  WBC 6.2  --  4.8 4.7 4.6  NEUTROABS 4.9  --   --   --   --   HGB 11.0*   < > 10.5* 9.8* 9.8*  HCT 35.4*   < > 32.3* 29.8* 32.1*  MCV 90.5  --  85.2 84.4 88.9  PLT PLATELET  CLUMPS NOTED ON SMEAR, UNABLE TO ESTIMATE  --  124* PLATELET CLUMPS NOTED ON SMEAR, COUNT APPEARS DECREASED 127*   < > = values in this interval not displayed.

## 2020-02-20 NOTE — Progress Notes (Signed)
PROGRESS NOTE    [name]  WUJ:[id number] DOB: [birth year] DOA: 02/15/2020 PCP: Biagio Borg, MD   Brief Narrative: 74 year old with past medical history significant for diabetes, hypertension COPD, CKD stage IIIb (cr 1.3--2.1), A. fib who presents with AKI, metabolic acidosis and hyperkalemia.  Patient was initiated on hemodialysis this admission.  Patient presented to [facility] ED 7/28 with generalized weakness for a few days and altered mental status.    Assessment & Plan:   Active Problems:   AKI (acute kidney injury) ([place])   Pressure injury of skin  1-Severe AKI on CKD stage IIIb; Critical ill on presentation; -Prior Cr range (1.3--2.1) -Presented with metabolic acidosis CO2 9, hyperkalemia potassium 7.5, 168, creatinine 11 -Patient received IV calcium, insulin, dextrose, IV bicarb drip in the ED. -Started on CRRT on admission.  -He has had HD> day 2.  -SPEP, SFLC pending.  -nephrology suspect subacute decline vs acute component from med (NSAID, ACE, Diuretic) -Urine out put  350 cc, Cr down to 3.8--3.3--2.8 -HD catheter removed 8/01.  2-M spike;  Bone surveay; negative.  Dr Burr Medico consulted.  IR consulted for bone marrow biopsy.   Metabolic acidosis: CO2 8, pH; 7.1 Improved with bicarb gtt and HD>  Resolved.   3-Acute metabolic encephalopathy: Multifactorial related to acidosis and uremia: CT head negative. Improved.   4-History of paroxysmal A. fib, hypertension hyperlipidemia He was transiently on heparin now back on Eliquis eliquis on hold for Bone marrow biopsy  Diabetes: Continue with a sliding scale insulin. -Hold metformin. Need to discontinue Metformin at discharge as well.  History of COPD: Continue with nebulizer  History of left lower extremity wound, Receiving hyperbaric oxygen therapy as an outpatient. Care consulted  Asymptomatic, runs VT; 6 runs.  Replete MG IV.  Metoprolol increase to 25 mg BID Troponin mild elevated, per  cardiology non specific. ECHO normal.  Follow up with cardiology outpatient.   Acute hypoxic Respiratory  failure Related to atelectasis./  Incentive spirometry On 2 L  Already on blood thinner.    LE wound;  Wound care consulted.   Pressure Injury 02/16/20 Sacrum Bilateral Stage 2 -  Partial thickness loss of dermis presenting as a shallow open injury with a red, pink wound bed without slough. (Active)  02/16/20 0400  Location: Sacrum  Location Orientation: Bilateral  Staging: Stage 2 -  Partial thickness loss of dermis presenting as a shallow open injury with a red, pink wound bed without slough.  Wound Description (Comments):   Present on Admission: Yes     Pressure Injury 02/16/20 Leg Right Deep Tissue Pressure Injury - Purple or maroon localized area of discolored intact skin or blood-filled blister due to damage of underlying soft tissue from pressure and/or shear. (Active)  02/16/20   Location: Leg  Location Orientation: Right  Staging: Deep Tissue Pressure Injury - Purple or maroon localized area of discolored intact skin or blood-filled blister due to damage of underlying soft tissue from pressure and/or shear.  Wound Description (Comments):   Present on Admission: Yes     Pressure Injury 02/16/20 Right Stage 2 -  Partial thickness loss of dermis presenting as a shallow open injury with a red, pink wound bed without slough. (Active)  02/16/20 2200  Location:   Location Orientation: Right  Staging: Stage 2 -  Partial thickness loss of dermis presenting as a shallow open injury with a red, pink wound bed without slough.  Wound Description (Comments):   Present on Admission: Yes  Pressure Injury 02/16/20 Buttocks Right;Lateral Stage 2 -  Partial thickness loss of dermis presenting as a shallow open injury with a red, pink wound bed without slough. (Active)  02/16/20 2200  Location: Buttocks  Location Orientation: Right;Lateral  Staging: Stage 2 -  Partial thickness  loss of dermis presenting as a shallow open injury with a red, pink wound bed without slough.  Wound Description (Comments):   Present on Admission: Yes     Nutrition Problem: Increased nutrient needs Etiology: chronic illness (CHF, COPD)    Signs/Symptoms: estimated needs    Interventions: MVI, Nepro shake, Liberalize Diet  Estimated body mass index is 31.62 kg/m as calculated from the following:   Height as of this encounter: '6\' 1"'  (1.854 m).   Weight as of this encounter: 108.7 kg.   DVT prophylaxis: Eliquis Code Status: Full code Family Communication: Daughters at bedside Disposition Plan:  Status is: Inpatient  Remains inpatient appropriate because:Persistent severe electrolyte disturbances   Dispo: The patient is from: Home              Anticipated d/c is to: to be determine              Anticipated d/c date is: 3 days              Patient currently is not medically stable to d/c. needs to determine ESRD and if requirement of HD .         Consultants:   Nephrology  CCM  Procedures:  CRRT HD Antimicrobials:    Subjective: He is feeling well, no new complaints. He is worry about bone marrow biopsy  Objective: Vitals:   02/19/20 2005 02/19/20 2038 02/20/20 0451 02/20/20 0940  BP: (!) 113/58 (!) 100/54 122/77 (!) 110/59  Pulse: 61 65 65 76  Resp: '18 16 16 18  ' Temp: 99.3 F (37.4 C) 99.7 F (37.6 C) 99.6 F (37.6 C) 99.7 F (37.6 C)  TempSrc: Oral Oral Oral Oral  SpO2:  97% 100% 99%  Weight:  (!) 108.7 kg    Height:        Intake/Output Summary (Last 24 hours) at 02/20/2020 1006 Last data filed at 02/20/2020 0600 Gross per 24 hour  Intake 240 ml  Output 350 ml  Net -110 ml   Filed Weights   02/15/20 1357 02/16/20 0054 02/19/20 2038  Weight: (!) 122.5 kg (!) 122.5 kg (!) 108.7 kg    Examination:  General exam: NAD Respiratory system: CTA Cardiovascular system: S 1,. S 2 RRR Gastrointestinal system: BS present, soft, nt Central  nervous system: follows command Extremities: right LE with lymphedema chronic wound   Data Reviewed: I have personally reviewed following labs and imaging studies  CBC: Recent Labs  Lab 02/15/20 1430 02/15/20 2011 02/16/20 0441 02/17/20 0421 02/20/20 0645  WBC 6.2  --  4.8 4.7 4.6  NEUTROABS 4.9  --   --   --   --   HGB 11.0* 11.9* 10.5* 9.8* 9.8*  HCT 35.4* 35.0* 32.3* 29.8* 32.1*  MCV 90.5  --  85.2 84.4 88.9  PLT PLATELET CLUMPS NOTED ON SMEAR, UNABLE TO ESTIMATE  --  124* PLATELET CLUMPS NOTED ON SMEAR, COUNT APPEARS DECREASED 824*   Basic Metabolic Panel: Recent Labs  Lab 02/16/20 0109 02/16/20 0109 02/16/20 0441 02/16/20 1222 02/17/20 0421 02/17/20 0421 02/17/20 1241 02/18/20 0420 02/18/20 1730 02/19/20 0406 02/20/20 0645  NA 135   < > 138   < > 138   < > 137  138 138 138 138  K 6.5*   < > 4.6   < > 4.2   < > 3.8 3.8 3.9 3.7 3.6  CL 113*   < > 107   < > 103   < > 105 106 105 105 105  CO2 10*   < > 18*   < > 26   < > '27 23 25 25 25  ' GLUCOSE 127*   < > 90   < > 98   < > 126* 103* 142* 101* 114*  BUN 153*   < > 100*   < > 52*   < > 52* 54* 53* 48* 42*  CREATININE 10.42*   < > 7.06*   < > 4.20*   < > 4.32* 4.10* 3.86* 3.35* 2.86*  CALCIUM 9.1   < > 9.0   < > 8.4*   < > 8.4* 8.6* 8.7* 8.7* 8.5*  MG 2.4  --   --   --  1.8  --   --   --  2.0  --   --   PHOS 5.8*   < > 4.1  --  3.3  --   --  3.4  --  3.1 2.6   < > = values in this interval not displayed.   GFR: Estimated Creatinine Clearance: 29.7 mL/min (A) (by C-G formula based on SCr of 2.86 mg/dL (H)). Liver Function Tests: Recent Labs  Lab 02/15/20 1747 02/15/20 1747 02/16/20 0441 02/17/20 0421 02/18/20 0420 02/19/20 0406 02/20/20 0645  AST 13*  --   --   --   --   --   --   ALT 9  --   --   --   --   --   --   ALKPHOS 64  --   --   --   --   --   --   BILITOT 0.6  --   --   --   --   --   --   PROT 7.7  --   --   --   --   --   --   ALBUMIN 3.3*   < > 3.1* 2.7* 2.6* 2.6* 2.6*   < > = values in this  interval not displayed.   No results for input(s): LIPASE, AMYLASE in the last 168 hours. No results for input(s): AMMONIA in the last 168 hours. Coagulation Profile: No results for input(s): INR, PROTIME in the last 168 hours. Cardiac Enzymes: Recent Labs  Lab 02/15/20 2230  CKTOTAL 230   BNP (last 3 results) No results for input(s): PROBNP in the last 8760 hours. HbA1C: No results for input(s): HGBA1C in the last 72 hours. CBG: Recent Labs  Lab 02/19/20 0646 02/19/20 1115 02/19/20 1707 02/19/20 2041 02/20/20 0621  GLUCAP 93 96 113* 115* 128*   Lipid Profile: No results for input(s): CHOL, HDL, LDLCALC, TRIG, CHOLHDL, LDLDIRECT in the last 72 hours. Thyroid Function Tests: No results for input(s): TSH, T4TOTAL, FREET4, T3FREE, THYROIDAB in the last 72 hours. Anemia Panel: No results for input(s): VITAMINB12, FOLATE, FERRITIN, TIBC, IRON, RETICCTPCT in the last 72 hours. Sepsis Labs: No results for input(s): PROCALCITON, LATICACIDVEN in the last 168 hours.  Recent Results (from the past 240 hour(s))  SARS Coronavirus 2 by RT PCR     Status: None   Collection Time: 02/15/20  4:09 PM  Result Value Ref Range Status   SARS Coronavirus 2 NEGATIVE NEGATIVE Final    Comment: (  NOTE) Result indicates the ABSENCE of SARS-CoV-2 RNA in the patient specimen.   The lowest concentration of SARS-CoV-2 viral copies this assay can detect in nasopharyngeal swab specimens is 500 copies / mL.  A negative result does not preclude SARS-CoV-2 infection and should not be used as the sole basis for patient management decisions. A negative result may occur with improper specimen collection / handling, submission of a specimen other than nasopharyngeal swab, presence of viral mutation(s) within the areas targeted by this assay, and inadequate number of viral copies (<500 copies / mL) present.  Negative results must be combined with clinical observations, patient history, and epidemiological  information.   The expected result is NEGATIVE.   Patient Fact Sheet:  BlogSelections.co.uk    Provider Fact Sheet:  https://lucas.com/    This test is not yet approved or cleared by the [place] FDA and  has been [place] orized for detection and/or diagnosis of SARS-CoV-2 by FDA under an Emergency Use Authorization (EUA).  This EUA will remain in effect (meaning this test can be used) for the duration of  the COVID-19 declaration under Section 564(b)(1) of the Act, 21 U.S.C. section 360bbb-3(b)(1), unless the authorization is terminated or revoked sooner  Performed at [facility], [place] [address], [place], [place] [address]   MRSA PCR Screening     Status: None   Collection Time: 02/16/20  4:45 AM   Specimen: Nasopharyngeal  Result Value Ref Range Status   MRSA by PCR NEGATIVE NEGATIVE Final    Comment:        The GeneXpert MRSA Assay (FDA approved for NASAL specimens only), is one component of a comprehensive MRSA colonization surveillance program. It is not intended to diagnose MRSA infection nor to guide or monitor treatment for MRSA infections. Performed at [facility], [place] [address], [place], [place] [address]          Radiology Studies: DG CHEST PORT 1 VIEW  Result Date: 02/18/2020 CLINICAL DATA:  Hypoxemia EXAM: PORTABLE CHEST 1 VIEW COMPARISON:  Radiograph 02/16/2020, CT 01/10/2011 FINDINGS: Dual lumen left IJ approach central venous catheter tip terminates in the lower SVC beyond the brachiocephalic-caval confluence. Additional support devices overlie the chest. Some atelectatic changes in the lungs which are otherwise clear. Stable cardiomediastinal contours with a calcified aorta. No acute osseous or soft tissue abnormality. Degenerative changes are present in the imaged spine and shoulders. IMPRESSION: Stable appearance of the left IJ approach central venous catheter. Mild atelectasis, otherwise no  acute cardiopulmonary process. Electronically Signed   By: Lovena Le M.D.   On: 02/18/2020 19:18   DG Bone Survey Met  Result Date: 02/19/2020 CLINICAL DATA:  Multiple myeloma.  Weakness.  Altered mental status. EXAM: METASTATIC BONE SURVEY COMPARISON:  Head CT 02/15/2020. FINDINGS: There are 2 small lucent lesions in the distal femoral shaft and 2 small lucent lesions in the mid tibial shaft. Left knee arthroplasty. These lesions are typical of ghost tracks from prior fixator pins. There is no other radiographic evidence of focal bone lesion. Heterogeneous appearance in the lower lumbar spine that is nonspecific and likely accentuated by multilevel degenerative change. Diffuse cervical, thoracic, and lumbar degenerative change. No evidence of compression fracture. Aortic atherosclerosis. IMPRESSION: 1. Two small lucent lesions in the distal femoral shaft and 2 small lucent lesions in the mid tibial shaft. Suspect this is related to prior external fixator pin placement, recommend correlation with clinical history. Left knee arthroplasty. 2. There is otherwise no radiographic  evidence of focal bone lesion. Electronically Signed   By: Keith Rake M.D.   On: 02/19/2020 15:34        Scheduled Meds: . (feeding supplement) PROSource Plus  30 mL Oral BID BM  . capsicum   Topical BID  . Chlorhexidine Gluconate Cloth  6 each Topical Q0600  . feeding supplement (NEPRO CARB STEADY)  237 mL Oral BID BM  . insulin aspart  0-5 Units Subcutaneous QHS  . insulin aspart  0-6 Units Subcutaneous TID WC  . metoprolol tartrate  25 mg Oral BID  . multivitamin  1 tablet Oral QHS  . pravastatin  40 mg Oral Daily  . sodium chloride flush  10-40 mL Intracatheter Q12H   Continuous Infusions:   LOS: 5 days    Time spent: 35 minutes.     Elmarie Shiley, MD [facility]   If 7PM-7AM, please contact night-coverage [email or web address]  02/20/2020, 10:06 AM

## 2020-02-20 NOTE — Progress Notes (Signed)
Patient scheduled to be seen in IR tomorrow, 01/21/20 for CT-guided bone marrow biopsy and aspiration. I attempted to get consent twice today from the patient. The first time I attempted he asked me to come back later so he could think about things more. I had explained the procedure to him but he had numerous questions pertaining to his future treatment plans, which I was unable to answer. He asked me to give him a few hours to think about things but when I went back at 1630 he said he does not want the biopsy. He said he might want it at a later date.   CT notified to cancel the order. Epic messages sent to both Dr. Burr Medico and Dr. Tyrell Antonio.   Please re-order the biopsy if the patient wishes to proceed. Please call IR with any questions.  Soyla Dryer, [place] [phone] 02/20/2020, 4:57 PM

## 2020-02-20 NOTE — Plan of Care (Signed)
  Problem: Education: Goal: Knowledge of General Education information will improve Description Including pain rating scale, medication(s)/side effects and non-pharmacologic comfort measures Outcome: Progressing   

## 2020-02-20 NOTE — H&P (Signed)
Chief Complaint: Patient was seen in consultation today for  Chief Complaint  Patient presents with  . Weakness  . Altered Mental Status    Referring Physician(s): Dr. Burr Medico  Supervising Physician: Corrie Mckusick  Patient Status: [facility] - In-pt  History of Present Illness: Whitten Wunschel is a 74 y.o. male with a medical history that includes DM2, COPD, prostate cancer and paroxysmal atrial fibrillation (Xarelto) He presented to the ED 02/15/20 with altered mental status and generalized weakness. He was found to have an acute kidney injury with a creatinine of 11.66 and a potassium of >7.5. He was seen by nephrology and started on hemodialysis. Additional lab work up showed positive M protein and abnormal ratios of serum free light chains, concerning for multiple myeloma.   Interventional Radiology has been asked to evaluate this patient for an image-guided bone marrow biopsy and aspiration for further work up and diagnosis.    Past Medical History:  Diagnosis Date  . Adenoma 05/2008  . Alcoholism in recovery ([place])   . BPH (benign prostatic hyperplasia)   . Cancer [facility])    Prostate  . Chronic LBP    Hip & Back -- Sees Dr. Maia Petties @ [facility]  . Colon polyps   . Complex renal cyst 12/2010  . Controlled type 2 diabetes mellitus with neuropathy ([place]) 01/2007  . COPD (chronic obstructive pulmonary disease) ([place])   . Diabetes ([place])   . Diverticulitis of colon   . ED (erectile dysfunction)    s/p Penile prosthesis (09/2010)  . History of prostate cancer    Dr. Karsten Ro  . History of sick sinus syndrome    reduced BB dose for Bradycardia  . HLD (hyperlipidemia)   . Hypertension   . Impaired glucose tolerance 12/21/2013  . Nephrolithiasis   . Osteoarthritis of both hips   . Osteoarthritis of lumbosacral spine    with Disc Disease  . PAF (paroxysmal atrial fibrillation) ([facility])    No longer on Amiodarone.  Not on Anticoaguation b/c no recurrence.  .  Paresthesias/numbness    Bilateral LE  . Peripheral neuropathy 12/21/2013    Past Surgical History:  Procedure Laterality Date  . CARDIAC CATHETERIZATION  2003   Normal Coronary Arteries.  Marland Kitchen NM MYOVIEW LTD  03/2016   No Ischemia or Infarct - Visual EF ~60% (computer EF ~39%) - by Echo 55-60%  . PENILE PROSTHESIS IMPLANT  06/21/2012   Procedure: PENILE PROTHESIS INFLATABLE;  Surgeon: Claybon Jabs, MD;  Location: [facility];  Service: Urology;  Laterality: N/A;  REMOVAL AND REPLACEMENT OF SOME  OF PROSTHESIS ([facility])   . PENILE PROSTHESIS PLACEMENT  09/2010  . PROSTATECTOMY    . REMOVAL OF PENILE PROSTHESIS N/A 02/15/2018   Procedure: REMOVAL OF PENILE PROSTHESIS;  Surgeon: Kathie Rhodes, MD;  Location: [facility];  Service: Urology;  Laterality: N/A;  . TRANSTHORACIC ECHOCARDIOGRAM  04/2016   a) Relatively normal EF of 55-60%. No RWMA suggesting no prior MI. Gr 2 DD (pseudo-normal filling pattern) along with moderately dilated left atrium.;; b) 02/2018: Mod LVH. EF 65-70%. No RWMA.  Unable to assess DF 2/2 Afib. Mild MR. Severe BiAtrial Enlargement. High CVP.    Allergies: Ciprofloxacin  Medications: Prior to Admission medications   Medication Sig Start Date End Date Taking? Authorizing Provider  Albuterol Sulfate 108 (90 Base) MCG/ACT AEPB Inhale 2 puffs into the lungs every 6 (six) hours as needed. Patient taking differently: Inhale 2 puffs into the lungs every 6 (six)  hours as needed (shortness of breath).  12/28/17  Yes Biagio Borg, MD  amLODipine (NORVASC) 10 MG tablet TAKE 1 TABLET BY MOUTH  DAILY Patient taking differently: Take 10 mg by mouth daily.  06/13/19  Yes Leonie Man, MD  benazepril (LOTENSIN) 40 MG tablet TAKE 1 TABLET BY MOUTH  DAILY Patient taking differently: Take 40 mg by mouth daily.  05/09/19  Yes Leonie Man, MD  Blood Glucose Monitoring Suppl (ONE TOUCH ULTRA 2) w/Device KIT Use as directed E11.9 09/01/17  Yes Biagio Borg, MD  furosemide  (LASIX) 40 MG tablet TAKE 2 TABLETS BY  MOUTH in AM and 1 in the PM Patient taking differently: Take 20 mg by mouth See admin instructions. TAKE 2 TABLETS BY  MOUTH in AM and 1 tablet by mouth in the PM 10/06/19  Yes Biagio Borg, MD  glucose blood (ONE TOUCH ULTRA TEST) test strip Use as instructed once per day E11.9 12/10/18  Yes Biagio Borg, MD  Ibuprofen 200 MG CAPS Take 200 mg by mouth. Take 1 to 2 tablet twice a day as needed   Yes [provider]  loratadine (CLARITIN) 10 MG tablet Take 10 mg by mouth daily as needed for allergies.   Yes [provider]  metFORMIN (GLUCOPHAGE-XR) 500 MG 24 hr tablet TAKE 1 TABLET BY MOUTH  DAILY WITH BREAKFAST Patient taking differently: Take 500 mg by mouth daily.  10/31/19  Yes Biagio Borg, MD  metoprolol succinate (TOPROL-XL) 100 MG 24 hr tablet Take 1 tablet (100 mg total) by mouth daily. Take with or immediately following a meal. Patient taking differently: Take 100 mg by mouth daily.  08/23/19  Yes Leonie Man, MD  omeprazole (PRILOSEC) 20 MG capsule TAKE 1 CAPSULE BY MOUTH  DAILY Patient taking differently: Take 20 mg by mouth daily.  08/22/19  Yes Leonie Man, MD  potassium chloride SA (KLOR-CON) 20 MEQ tablet TAKE 1 TO 2 TABLETS BY  MOUTH DAILY AS NEEDED FOR  WEIGHT GAIN OF 3LBS OR MORE Patient taking differently: Take 20 mEq by mouth daily as needed (For weight gain of 2lbs or more).  10/31/19  Yes Leonie Man, MD  pravastatin (PRAVACHOL) 40 MG tablet TAKE 1 TABLET BY MOUTH  DAILY Patient taking differently: Take 40 mg by mouth daily.  05/09/19  Yes Leonie Man, MD  spironolactone (ALDACTONE) 25 MG tablet Take 1 tablet (25 mg total) by mouth daily. 10/07/19  Yes Biagio Borg, MD  Vitamin D, Ergocalciferol, (DRISDOL) 1.25 MG (50000 UNIT) CAPS capsule Take 1 capsule (50,000 Units total) by mouth every 7 (seven) days. 10/07/19  Yes Biagio Borg, MD  XARELTO 20 MG TABS tablet TAKE 1 TABLET BY MOUTH  DAILY WITH  SUPPER Patient taking differently: Take 20 mg by mouth daily with supper.  08/31/19  Yes Leonie Man, MD     Family History  Problem Relation Age of Onset  . Coronary artery disease Mother   . Heart attack Mother   . Coronary artery disease Father   . Prostate cancer Father   . Diabetes Father     Social History   Socioeconomic History  . Marital status: Single    Spouse name: Not on file  . Number of children: 3  . Years of education: Not on file  . Highest education level: Not on file  Occupational History  . Occupation: Retired  Tobacco Use  . Smoking status: Former Smoker  Types: Cigarettes    Quit date: 11/04/1982    Years since quitting: 37.3  . Smokeless tobacco: Never Used  Vaping Use  . Vaping Use: Never used  Substance and Sexual Activity  . Alcohol use: Yes    Comment: no alcohol x 1 week wants to stop  . Drug use: No    Types: Marijuana    Comment: use to smoke marijuana  . Sexual activity: Not on file  Other Topics Concern  . 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   Divorced. 3 grown children. 4 GC.     Quit smoking ~20+ yrs ago.    Social Determinants of Health   Financial Resource Strain:   . Difficulty of Paying Living Expenses:   Food Insecurity:   . Worried About Charity fundraiser in the Last Year:   . Arboriculturist in the Last Year:   Transportation Needs:   . Film/video editor (Medical):   Marland Kitchen Lack of Transportation (Non-Medical):   Physical Activity:   . Days of Exercise per Week:   . Minutes of Exercise per Session:   Stress:   . Feeling of Stress :   Social Connections:   . Frequency of Communication with Friends and Family:   . Frequency of Social Gatherings with Friends and Family:   . Attends Religious Services:   . Active Member of Clubs or Organizations:   . Attends Archivist Meetings:   Marland Kitchen Marital Status:     Review of  Systems: A 12 point ROS discussed and pertinent positives are indicated in the HPI above.  All other systems are negative.  Review of Systems  Constitutional: Positive for fatigue. Negative for appetite change.  Respiratory: Negative for cough and shortness of breath.   Cardiovascular: Negative for chest pain.  Gastrointestinal: Negative for abdominal pain, nausea and vomiting.  Musculoskeletal: Negative for back pain.  Neurological: Negative for headaches.    Vital Signs: BP (!) 110/59 (BP Location: Left Arm)   Pulse 76   Temp 99.7 F (37.6 C) (Oral)   Resp 18   Ht '6\' 1"'  (1.854 m)   Wt (!) 239 lb 10.2 oz (108.7 kg)   SpO2 99%   BMI 31.62 kg/m   Physical Exam Constitutional:      General: He is not in acute distress. HENT:     Mouth/Throat:     Mouth: Mucous membranes are moist.     Pharynx: Oropharynx is clear.  Cardiovascular:     Rate and Rhythm: Normal rate. Rhythm irregular.     Pulses: Normal pulses.     Heart sounds: Normal heart sounds.  Pulmonary:     Effort: Pulmonary effort is normal.     Breath sounds: Normal breath sounds.  Abdominal:     General: Bowel sounds are normal.     Palpations: Abdomen is soft.  Skin:    General: Skin is warm and dry.     Findings: Wound present.     Comments: Multiple right lower extremity wounds - unable to observe (wrapped in ACE).  Sacral wound  Neurological:     Mental Status: He is alert.     Imaging: CT Head Wo Contrast  Result Date: 02/15/2020 CLINICAL DATA:  Mental status change EXAM: CT HEAD WITHOUT CONTRAST TECHNIQUE: Contiguous axial images were obtained from the base of the skull through the vertex without intravenous  contrast. COMPARISON:  None. FINDINGS: Brain: No hemorrhage or intracranial mass is visualized. Small focus of encephalomalacia in the left occipital lobe consistent with chronic infarct. Moderate atrophy. Moderate hypodensity in the white matter likely chronic small vessel ischemic change.  Prominent ventricles felt secondary to atrophy. Vascular: No hyperdense vessels.  Carotid vascular calcification Skull: Normal. Negative for fracture or focal lesion. Sinuses/Orbits: Mild mucosal thickening in the ethmoid sinuses Other: None IMPRESSION: 1. No CT evidence for acute intracranial abnormality. 2. Atrophy and small vessel ischemic changes of the white matter. Chronic left occipital lobe infarct. Electronically Signed   By: Donavan Foil M.D.   On: 02/15/2020 16:09   US RENAL  Result Date: 02/15/2020 CLINICAL DATA:  Acute kidney injury. EXAM: RENAL / URINARY TRACT ULTRASOUND COMPLETE COMPARISON:  Abdominal CT 05/27/2018. Remote abdominal MRI 01/27/2011 FINDINGS: Right Kidney: Renal measurements: 12.9 x 6.8 x 5.0 cm = volume: 228 mL. Increased renal parenchymal echogenicity. No hydronephrosis. Cyst in the mid kidney measures 2.9 x 2.3 x 1.9 cm. Additional cysts on prior CT are not well characterized. No evidence of solid lesion. Previous stones on CT are not well seen by ultrasound. Left Kidney: Renal measurements: 12.8 x 6.5 x 4.8 cm = volume: 208 mL. Increased renal parenchymal echogenicity. No hydronephrosis. 2 cyst in the left kidney, exophytic 8.2 x 7.4 x 7.7 cm cyst arises from the upper pole. Cyst in the lower kidney measures 3.5 x 3 x 3.1 cm. Additional cysts on CT are not well resolved by ultrasound. Previous stones on CT are not seen by ultrasound. Bladder: Appears normal for degree of bladder distention. Other: None. IMPRESSION: 1. Increased renal parenchymal echogenicity consistent with chronic medical renal disease. No obstructive uropathy. 2. Bilateral renal cysts. Many of the cysts on prior CT are not well-defined by ultrasound. 3. Renal stones on prior exam are not demonstrated. Electronically Signed   By: Keith Rake M.D.   On: 02/15/2020 21:39   DG CHEST PORT 1 VIEW  Result Date: 02/18/2020 CLINICAL DATA:  Hypoxemia EXAM: PORTABLE CHEST 1 VIEW COMPARISON:  Radiograph  02/16/2020, CT 01/10/2011 FINDINGS: Dual lumen left IJ approach central venous catheter tip terminates in the lower SVC beyond the brachiocephalic-caval confluence. Additional support devices overlie the chest. Some atelectatic changes in the lungs which are otherwise clear. Stable cardiomediastinal contours with a calcified aorta. No acute osseous or soft tissue abnormality. Degenerative changes are present in the imaged spine and shoulders. IMPRESSION: Stable appearance of the left IJ approach central venous catheter. Mild atelectasis, otherwise no acute cardiopulmonary process. Electronically Signed   By: Lovena Le M.D.   On: 02/18/2020 19:18   DG Chest Port 1 View  Result Date: 02/16/2020 CLINICAL DATA:  Respiratory failure. EXAM: PORTABLE CHEST 1 VIEW COMPARISON:  02/15/2020.  09/08/2014. FINDINGS: Left IJ line stable position. Heart size stable. Left base atelectasis/scarring again noted. Mild left base infiltrate cannot be excluded. Stable elevation left hemidiaphragm. No pleural effusion or pneumothorax. And thoracic spine. IMPRESSION: 1.  Left IJ line stable position. 2. Left base atelectasis/scarring again noted. Mild left base infiltrate cannot be excluded. Stable elevation left hemidiaphragm. Electronically Signed   By: Marcello Moores  Register   On: 02/16/2020 05:54   DG CHEST PORT 1 VIEW  Result Date: 02/15/2020 CLINICAL DATA:  Follow-up central line placement EXAM: PORTABLE CHEST 1 VIEW COMPARISON:  02/15/2020 FINDINGS: Cardiac shadow is enlarged but stable. Left jugular central line is noted in the mid superior vena cava. No pneumothorax is noted. No infiltrate or  effusion is noted. IMPRESSION: No pneumothorax following central line placement. Electronically Signed   By: Inez Catalina M.D.   On: 02/15/2020 21:06   DG Chest Portable 1 View  Result Date: 02/15/2020 CLINICAL DATA:  Generalized weakness. EXAM: PORTABLE CHEST 1 VIEW COMPARISON:  02/25/2018 FINDINGS: Heart size mildly enlarged.  Vascularity normal. Elevated left hemidiaphragm with mild left lower lobe atelectasis. Negative for pneumonia or effusion. IMPRESSION: Mild left lower lobe atelectasis.  Negative for heart failure. Electronically Signed   By: Franchot Gallo M.D.   On: 02/15/2020 15:14   DG Bone Survey Met  Result Date: 02/19/2020 CLINICAL DATA:  Multiple myeloma.  Weakness.  Altered mental status. EXAM: METASTATIC BONE SURVEY COMPARISON:  Head CT 02/15/2020. FINDINGS: There are 2 small lucent lesions in the distal femoral shaft and 2 small lucent lesions in the mid tibial shaft. Left knee arthroplasty. These lesions are typical of ghost tracks from prior fixator pins. There is no other radiographic evidence of focal bone lesion. Heterogeneous appearance in the lower lumbar spine that is nonspecific and likely accentuated by multilevel degenerative change. Diffuse cervical, thoracic, and lumbar degenerative change. No evidence of compression fracture. Aortic atherosclerosis. IMPRESSION: 1. Two small lucent lesions in the distal femoral shaft and 2 small lucent lesions in the mid tibial shaft. Suspect this is related to prior external fixator pin placement, recommend correlation with clinical history. Left knee arthroplasty. 2. There is otherwise no radiographic evidence of focal bone lesion. Electronically Signed   By: Keith Rake M.D.   On: 02/19/2020 15:34   ECHOCARDIOGRAM COMPLETE  Result Date: 02/17/2020    ECHOCARDIOGRAM REPORT   Patient Name:   [name] Date of Exam: 02/17/2020 Medical Rec #:  [id number]           Height:       73.0 in Accession #:    [id number]          Weight:       270.1 lb Date of Birth:  08/20/1945           BSA:          2.445 m Patient Age:    44 years            BP:           119/55 mmHg Patient Gender: M                   HR:           72 bpm. Exam Location:  Inpatient Procedure: 2D Echo, Cardiac Doppler and Color Doppler Indications:    R94.31 Abnormal EKG  History:        Patient  has prior history of Echocardiogram examinations, most                 recent 02/25/2018. COPD, Arrythmias:Atrial Fibrillation; Risk                 Factors:Hypertension, Diabetes and Dyslipidemia. Cancer. Alcohol                 use.  Sonographer:    Tiffany Dance Referring Phys: [id number] BELKYS A REGALADO IMPRESSIONS  1. Left ventricular ejection fraction, by estimation, is 60 to 65%. The left ventricle has normal function. The left ventricle has no regional wall motion abnormalities. There is mild concentric left ventricular hypertrophy. Left ventricular diastolic parameters are indeterminate due to underlying atrial fibrillation.  2. Right ventricular systolic function is normal. The right ventricular size  is normal. There is normal pulmonary artery systolic pressure. The estimated right ventricular systolic pressure is 56.3 mmHg.  3. Left atrial size was mildly dilated.  4. The mitral valve is normal in structure. Trivial mitral valve regurgitation. No evidence of mitral stenosis.  5. The aortic valve is tricuspid. Aortic valve regurgitation is not visualized. Mild aortic valve sclerosis is present, with no evidence of aortic valve stenosis.  6. Aortic dilatation noted. There is mild dilatation of the aortic root and of the ascending aorta measuring 40 mm and 22m respectively.  7. The inferior vena cava is normal in size with greater than 50% respiratory variability, suggesting right atrial pressure of 3 mmHg. FINDINGS  Left Ventricle: Left ventricular ejection fraction, by estimation, is 60 to 65%. The left ventricle has normal function. The left ventricle has no regional wall motion abnormalities. The left ventricular internal cavity size was normal in size. There is  mild concentric left ventricular hypertrophy. Left ventricular diastolic parameters are indeterminate. Normal left ventricular filling pressure. Right Ventricle: The right ventricular size is normal. No increase in right ventricular wall thickness.  Right ventricular systolic function is normal. There is normal pulmonary artery systolic pressure. The tricuspid regurgitant velocity is 2.55 m/s, and  with an assumed right atrial pressure of 3 mmHg, the estimated right ventricular systolic pressure is 287.5mmHg. Left Atrium: Left atrial size was mildly dilated. Right Atrium: Right atrial size was normal in size. Pericardium: There is no evidence of pericardial effusion. Mitral Valve: The mitral valve is normal in structure. Normal mobility of the mitral valve leaflets. Trivial mitral valve regurgitation. No evidence of mitral valve stenosis. Tricuspid Valve: The tricuspid valve is normal in structure. Tricuspid valve regurgitation is trivial. No evidence of tricuspid stenosis. Aortic Valve: The aortic valve is tricuspid. Aortic valve regurgitation is not visualized. Mild aortic valve sclerosis is present, with no evidence of aortic valve stenosis. Pulmonic Valve: The pulmonic valve was normal in structure. Pulmonic valve regurgitation is not visualized. No evidence of pulmonic stenosis. Aorta: Aortic dilatation noted. There is mild dilatation of the aortic root and of the ascending aorta measuring 40 mm. Venous: The inferior vena cava is normal in size with greater than 50% respiratory variability, suggesting right atrial pressure of 3 mmHg. IAS/Shunts: No atrial level shunt detected by color flow Doppler.  LEFT VENTRICLE PLAX 2D LVIDd:         4.80 cm LVIDs:         3.20 cm LV PW:         1.30 cm LV IVS:        1.20 cm LVOT diam:     2.30 cm LV SV:         71 LV SV Index:   29 LVOT Area:     4.15 cm  RIGHT VENTRICLE          IVC RV Basal diam:  2.50 cm  IVC diam: 1.80 cm TAPSE (M-mode): 1.2 cm LEFT ATRIUM              Index       RIGHT ATRIUM           Index LA diam:        4.90 cm  2.00 cm/m  RA Area:     18.60 cm LA Vol (A2C):   119.0 ml 48.68 ml/m RA Volume:   48.20 ml  19.72 ml/m LA Vol (A4C):   75.2 ml  30.76 ml/m LA Biplane Vol: 95.8  ml  39.19 ml/m   AORTIC VALVE LVOT Vmax:   112.50 cm/s LVOT Vmean:  73.000 cm/s LVOT VTI:    0.171 m  AORTA Ao Root diam: 4.00 cm Ao Asc diam:  3.80 cm MITRAL VALVE               TRICUSPID VALVE MV Area (PHT): 3.38 cm    TR Peak grad:   26.0 mmHg MV Decel Time: 225 msec    TR Vmax:        255.00 cm/s MV E velocity: 65.20 cm/s                            SHUNTS                            Systemic VTI:  0.17 m                            Systemic Diam: 2.30 cm Fransico Him MD Electronically signed by Fransico Him MD Signature Date/Time: 02/17/2020/3:11:41 PM    Final     Labs:  CBC: Recent Labs    02/15/20 1430 02/15/20 1430 02/15/20 2011 02/16/20 0441 02/17/20 0421 02/20/20 0645  WBC 6.2  --   --  4.8 4.7 4.6  HGB 11.0*   < > 11.9* 10.5* 9.8* 9.8*  HCT 35.4*   < > 35.0* 32.3* 29.8* 32.1*  PLT PLATELET CLUMPS NOTED ON SMEAR, UNABLE TO ESTIMATE  --   --  124* PLATELET CLUMPS NOTED ON SMEAR, COUNT APPEARS DECREASED 127*   < > = values in this interval not displayed.    COAGS: Recent Labs    02/16/20 0441  APTT 137*    BMP: Recent Labs    02/18/20 0420 02/18/20 1730 02/19/20 0406 02/20/20 0645  NA 138 138 138 138  K 3.8 3.9 3.7 3.6  CL 106 105 105 105  CO2 '23 25 25 25  ' GLUCOSE 103* 142* 101* 114*  BUN 54* 53* 48* 42*  CALCIUM 8.6* 8.7* 8.7* 8.5*  CREATININE 4.10* 3.86* 3.35* 2.86*  GFRNONAA 14* 15* 17* 21*  GFRAA 16* 17* 20* 24*    LIVER FUNCTION TESTS: Recent Labs    10/06/19 1636 10/06/19 1636 02/15/20 1747 02/16/20 0441 02/17/20 0421 02/18/20 0420 02/19/20 0406 02/20/20 0645  BILITOT 0.5  --  0.6  --   --   --   --   --   AST 16  --  13*  --   --   --   --   --   ALT 8  --  9  --   --   --   --   --   ALKPHOS 95  --  64  --   --   --   --   --   PROT 8.5*  --  7.7  --   --   --   --   --   ALBUMIN 3.6   < > 3.3*   < > 2.7* 2.6* 2.6* 2.6*   < > = values in this interval not displayed.    TUMOR MARKERS: No results for input(s): AFPTM, CEA, CA199, CHROMGRNA in the last  8760 hours.  Assessment and Plan:  Positive SPEP; concern for multiple myeloma: [name], 74 year old male, is scheduled for an image-guided bone marrow biopsy and aspiration in Interventional  Radiology tomorrow, 02/22/20. He presented to the ED with weakness and altered mental status. He was found to have an AKI requiring two sessions of hemodialysis. Lab work up is suspicious for multiple myeloma.   Risks and benefits of a bone marrow biopsy and aspiration were discussed with the patient and/or patient's family including, but not limited to bleeding, infection, damage to adjacent structures or low yield requiring additional tests.  Patient initially refused procedure when seen yesterday, 02/20/20, but is now in agreement. Patient's daughter at the bedside during today's evaluation/consent.   All of the questions were answered and there is agreement to proceed.  Consent signed and in chart.  Thank you for this interesting consult.  I greatly enjoyed meeting Esmond Eshelman and look forward to participating in their care.  A copy of this report was sent to the requesting provider on this date.  Electronically Signed: Soyla Dryer, AGACNP-BC [phone] 02/20/2020, 2:04 PM   I spent a total of 40 Minutes    in face to face in clinical consultation, greater than 50% of which was counseling/coordinating care for image-guided bone marrow biopsy and aspiration.

## 2020-02-21 ENCOUNTER — Other Ambulatory Visit (HOSPITAL_COMMUNITY): Payer: Medicare Other

## 2020-02-21 DIAGNOSIS — C9000 Multiple myeloma not having achieved remission: Secondary | ICD-10-CM

## 2020-02-21 DIAGNOSIS — Z515 Encounter for palliative care: Secondary | ICD-10-CM

## 2020-02-21 DIAGNOSIS — I4729 Other ventricular tachycardia: Secondary | ICD-10-CM

## 2020-02-21 DIAGNOSIS — I472 Ventricular tachycardia: Secondary | ICD-10-CM

## 2020-02-21 DIAGNOSIS — I482 Chronic atrial fibrillation, unspecified: Secondary | ICD-10-CM

## 2020-02-21 HISTORY — PX: NM MYOVIEW LTD: HXRAD82

## 2020-02-21 LAB — MAGNESIUM: Magnesium: 1.7 mg/dL (ref 1.7–2.4)

## 2020-02-21 LAB — COMPREHENSIVE METABOLIC PANEL
ALT: 13 U/L (ref 0–44)
AST: 22 U/L (ref 15–41)
Albumin: 2.7 g/dL — ABNORMAL LOW (ref 3.5–5.0)
Alkaline Phosphatase: 63 U/L (ref 38–126)
Anion gap: 11 (ref 5–15)
BUN: 38 mg/dL — ABNORMAL HIGH (ref 8–23)
CO2: 24 mmol/L (ref 22–32)
Calcium: 8.7 mg/dL — ABNORMAL LOW (ref 8.9–10.3)
Chloride: 103 mmol/L (ref 98–111)
Creatinine, Ser: 2.75 mg/dL — ABNORMAL HIGH (ref 0.61–1.24)
GFR calc Af Amer: 25 mL/min — ABNORMAL LOW (ref >60)
GFR calc non Af Amer: 22 mL/min — ABNORMAL LOW (ref >60)
Glucose, Bld: 99 mg/dL (ref 70–99)
Potassium: 4 mmol/L (ref 3.5–5.1)
Sodium: 138 mmol/L (ref 135–145)
Total Bilirubin: 0.7 mg/dL (ref 0.3–1.2)
Total Protein: 6.8 g/dL (ref 6.5–8.1)

## 2020-02-21 LAB — GLUCOSE, CAPILLARY
Glucose-Capillary: 90 mg/dL (ref 70–99)
Glucose-Capillary: 90 mg/dL (ref 70–99)
Glucose-Capillary: 119 mg/dL — ABNORMAL HIGH (ref 70–99)
Glucose-Capillary: 100 mg/dL — ABNORMAL HIGH (ref 70–99)
Glucose-Capillary: 93 mg/dL (ref 70–99)

## 2020-02-21 LAB — IMMUNOFIXATION ELECTROPHORESIS
IgA: 113 mg/dL (ref 61–437)
IgG (Immunoglobin G), Serum: 2678 mg/dL — ABNORMAL HIGH (ref 603–1613)
IgM (Immunoglobulin M), Srm: 28 mg/dL (ref 15–143)
Total Protein ELP: 7 g/dL (ref 6.0–8.5)

## 2020-02-21 LAB — BETA 2 MICROGLOBULIN, SERUM: Beta-2 Microglobulin: 6.4 mg/L — ABNORMAL HIGH (ref 0.6–2.4)

## 2020-02-21 LAB — PHOSPHORUS: Phosphorus: 3.3 mg/dL (ref 2.5–4.6)

## 2020-02-21 MED ORDER — APIXABAN 5 MG PO TABS
5.0000 mg | ORAL_TABLET | Freq: Two times a day (BID) | ORAL | Status: DC
  Administered 2020-02-22 – 2020-02-23 (×3): 5 mg via ORAL
  Filled 2020-02-21 (×6): qty 1

## 2020-02-21 MED ORDER — MAGNESIUM SULFATE 2 GM/50ML IV SOLN
2.0000 g | Freq: Once | INTRAVENOUS | Status: AC
  Administered 2020-02-21: 2 g via INTRAVENOUS
  Filled 2020-02-21: qty 50

## 2020-02-21 NOTE — Progress Notes (Signed)
Page re: 11 beats of V-tach and 2.5s pause. Patient was asymptomatic. Added mag and cmp for morning labs. Continue to monitor.

## 2020-02-21 NOTE — Plan of Care (Signed)
  Problem: Safety: Goal: Ability to remain free from injury will improve Outcome: Progressing   

## 2020-02-21 NOTE — Progress Notes (Signed)
Patient ID: [name], male   DOB: 20-Jul-1946, 74 y.o.   MRN: [id number]  This NP Wadie Lessen reviewed medical records, received report from team,  and then introduced myself and the role of palliative medicine to the patient and his daughter.   Concept of Palliative Care was introduced as specialized medical care for people and their families living with serious illness.  If focuses on providing relief from the symptoms and stress of a serious illness.  The goal is to improve quality of life for both the patient and the family.  Both patient and his daughter declined further conversation with palliative medicine.  I left contact information and encouraged them to call with questions or concerns during this hospitalization or into the future.   Will notify attending and sign off.  Please reconsult with any future palliative medicine needs.  Wadie Lessen NP  Palliative Medicine Team Team Phone # [phone] Pager [phone]  No charge

## 2020-02-21 NOTE — Progress Notes (Addendum)
[place] for Heparin to Apixaban Indication: atrial fibrillation  Allergies  Allergen Reactions  . Ciprofloxacin Other (See Comments)    All over weakness    Patient Measurements: Height: '6\' 1"'  (185.4 cm) Weight: (!) 108.7 kg (239 lb 10.2 oz) IBW/kg (Calculated) : 79.9 Heparin Dosing Weight: 106.7 kg  Vital Signs: Temp: 98.6 F (37 C) (08/03 0831) Temp Source: Oral (08/03 0831) BP: 121/74 (08/03 0831) Pulse Rate: 62 (08/03 0831)  Labs: Recent Labs    02/18/20 1730 02/18/20 1730 02/18/20 1942 02/19/20 0406 02/20/20 0645 02/21/20 0410  HGB  --   --   --   --  9.8*  --   HCT  --   --   --   --  32.1*  --   PLT  --   --   --   --  127*  --   CREATININE 3.86*   < >  --  3.35* 2.86* 2.75*  TROPONINIHS 67*  --  73*  --   --   --    < > = values in this interval not displayed.    Estimated Creatinine Clearance: 30.9 mL/min (A) (by C-G formula based on SCr of 2.75 mg/dL (H)).   Medical History: Past Medical History:  Diagnosis Date  . Adenoma 05/2008  . Alcoholism in recovery ([place])   . BPH (benign prostatic hyperplasia)   . Cancer [facility])    Prostate  . Chronic LBP    Hip & Back -- Sees Dr. Maia Petties @ [facility]  . Colon polyps   . Complex renal cyst 12/2010  . Controlled type 2 diabetes mellitus with neuropathy ([place]) 01/2007  . COPD (chronic obstructive pulmonary disease) ([place])   . Diabetes ([place])   . Diverticulitis of colon   . ED (erectile dysfunction)    s/p Penile prosthesis (09/2010)  . History of prostate cancer    Dr. Karsten Ro  . History of sick sinus syndrome    reduced BB dose for Bradycardia  . HLD (hyperlipidemia)   . Hypertension   . Impaired glucose tolerance 12/21/2013  . Nephrolithiasis   . Osteoarthritis of both hips   . Osteoarthritis of lumbosacral spine    with Disc Disease  . PAF (paroxysmal atrial fibrillation) ([facility])    No longer on Amiodarone.  Not on Anticoaguation b/c no recurrence.  .  Paresthesias/numbness    Bilateral LE  . Peripheral neuropathy 12/21/2013    Medications:  Scheduled:  . (feeding supplement) PROSource Plus  30 mL Oral BID BM  . apixaban  5 mg Oral BID  . capsicum   Topical BID  . Chlorhexidine Gluconate Cloth  6 each Topical Q0600  . feeding supplement (NEPRO CARB STEADY)  237 mL Oral BID BM  . insulin aspart  0-5 Units Subcutaneous QHS  . insulin aspart  0-6 Units Subcutaneous TID WC  . metoprolol tartrate  25 mg Oral BID  . multivitamin  1 tablet Oral QHS  . pravastatin  40 mg Oral Daily  . sodium chloride flush  10-40 mL Intracatheter Q12H    Assessment: Patient is a 35 yom that presented to the ED on 7/28 with c/o weakness and AMS. The patient was on xarelto for afib prior to admission.  During this admission his antcoagulation therapy was switched to Eliquis (apixaban) due to his decline in renal function.  Eliquis was held 8/2 , as requested by hematology  in preparation for a bone marrow biopsy by  IR. Last apixaban dose given 8/1 AM.  It was reported that patient has refused the bone marrow biopsy yesterday. Now agrees to proceed with biopsy, planned for 8/4. Dr. Tyrell Antonio spoke with IR PA, who said okay to give apixaban , do note need to hold this prior to bone marrow biopsy.   Pharmacy has been consulted to resume Eliquis today 02/21/20.    Goal of Therapy:  Monitor platelets by anticoagulation protocol: Yes   Plan:  Restart apixaban 5 mg po bid Monitor for signs and symptoms of bleeding  [name], RPh Clinical Pharmacist [phone] Please see AMION for all Pharmacists' Contact Phone Numbers 02/21/2020, 10:16 AM

## 2020-02-21 NOTE — Progress Notes (Signed)
PROGRESS NOTE    [name]  WNI:[id number] DOB: 09/25/1945 DOA: 02/15/2020 PCP: Biagio Borg, MD   Brief Narrative: 74 year old with past medical history significant for diabetes, hypertension COPD, CKD stage IIIb (cr 1.3--2.1), A. fib who presents with AKI, metabolic acidosis and hyperkalemia.  Patient required  hemodialysis this admission, currently he has been off HD. Plan is to monitor renal function.  Patient presented to [facility] ED 7/28 with generalized weakness for a few days and altered mental status.   Patient admitted with metabolic acidosis and AKI on CKD stage IIIb he required CRRT on admission and  underwent  hemodialysis x2.  His kidney function recover.  Temporary hemodialysis catheter was removed.  He was found to have M spike. Plan for bone marrow biopsy 8/04.   Assessment & Plan:   Active Problems:   AKI (acute kidney injury) ([place])   Pressure injury of skin  1-Severe AKI on CKD stage IIIb; Critical ill on presentation; -Prior Cr range (1.3--2.1) -Presented with metabolic acidosis CO2 9, hyperkalemia potassium 7.5, 168, creatinine 11 -Patient received IV calcium, insulin, dextrose, IV bicarb drip in the ED. -Started on CRRT on admission.  -He has had HD> day 2.  -SPEP, SFLC pending.  -nephrology suspect subacute decline vs acute component from med (NSAID, ACE, Diuretic) -Urine out put  350 cc, Cr down to 3.8--3.3--2.8--2.6 -HD catheter removed 8/01.  2-M spike;  Bone surveay; negative.  Dr Burr Medico consulted.  IR consulted for bone marrow biopsy.  Patient agrees to undergoing Biopsy, BM tomorrow.   Metabolic acidosis: CO2 8, pH; 7.1 Improved with bicarb gtt and HD>  Resolved.   3-Acute metabolic encephalopathy: Multifactorial related to acidosis and uremia: CT head negative. Improved.   4-History of paroxysmal A. fib, hypertension hyperlipidemia He was transiently on heparin now back on Eliquis Ok to continue with eliquis, no need to hold for  BM biopsy.   Diabetes: Continue with a sliding scale insulin. -Hold metformin. Need to discontinue Metformin at discharge as well.  History of COPD: Continue with nebulizer.  History of left lower extremity wound: Receiving hyperbaric oxygen therapy as an outpatient. Care consulted  Asymptomatic, runs VT; 6 runs.  Metoprolol increase to 25 mg BID Troponin mild elevated, per cardiology non specific. ECHO normal.  Follow up with cardiology outpatient.  Overnight svt, pauses. Mg at 1.7 will replete mg. Monitor for hypoxemia. Continue with current BB dose.  Recurrent SVT this afternoon. Cardiology will see patient today again.   Acute hypoxic Respiratory  Failure: Related to atelectasis./  Incentive spirometry. On 2 L  Already on blood thinner.  Off oxygen today.   LE wound;  Wound care consulted and following.   Pressure Injury 02/16/20 Sacrum Bilateral Stage 2 -  Partial thickness loss of dermis presenting as a shallow open injury with a red, pink wound bed without slough. (Active)  02/16/20 0400  Location: Sacrum  Location Orientation: Bilateral  Staging: Stage 2 -  Partial thickness loss of dermis presenting as a shallow open injury with a red, pink wound bed without slough.  Wound Description (Comments):   Present on Admission: Yes     Pressure Injury 02/16/20 Leg Right Deep Tissue Pressure Injury - Purple or maroon localized area of discolored intact skin or blood-filled blister due to damage of underlying soft tissue from pressure and/or shear. (Active)  02/16/20   Location: Leg  Location Orientation: Right  Staging: Deep Tissue Pressure Injury - Purple or maroon localized area of discolored intact skin  or blood-filled blister due to damage of underlying soft tissue from pressure and/or shear.  Wound Description (Comments):   Present on Admission: Yes     Pressure Injury 02/16/20 Right Stage 2 -  Partial thickness loss of dermis presenting as a shallow open injury with  a red, pink wound bed without slough. (Active)  02/16/20 2200  Location:   Location Orientation: Right  Staging: Stage 2 -  Partial thickness loss of dermis presenting as a shallow open injury with a red, pink wound bed without slough.  Wound Description (Comments):   Present on Admission: Yes     Pressure Injury 02/16/20 Buttocks Right;Lateral Stage 2 -  Partial thickness loss of dermis presenting as a shallow open injury with a red, pink wound bed without slough. (Active)  02/16/20 2200  Location: Buttocks  Location Orientation: Right;Lateral  Staging: Stage 2 -  Partial thickness loss of dermis presenting as a shallow open injury with a red, pink wound bed without slough.  Wound Description (Comments):   Present on Admission: Yes     Nutrition Problem: Increased nutrient needs Etiology: chronic illness (CHF, COPD)    Signs/Symptoms: estimated needs    Interventions: MVI, Nepro shake, Liberalize Diet  Estimated body mass index is 31.62 kg/m as calculated from the following:   Height as of this encounter: '6\' 1"'  (1.854 m).   Weight as of this encounter: 108.7 kg.   DVT prophylaxis: Eliquis Code Status: Full code Family Communication: Daughters at bedside Disposition Plan:  Status is: Inpatient  Remains inpatient appropriate because:Persistent severe electrolyte disturbances   Dispo: The patient is from: Home              Anticipated d/c is to: to be determine              Anticipated d/c date is: 3 days              Patient currently is not medically stable to d/c. Continue to monitor urine out put. Bone marrow biopsy tomorrow.        Consultants:   Nephrology  CCM  Procedures:  CRRT HD Antimicrobials:    Subjective: He is feeling ok, daughter at bedside. He now agrees to have Bone marrow done.   Objective: Vitals:   02/20/20 2100 02/20/20 2250 02/21/20 0445 02/21/20 0831  BP: 102/66  114/68 121/74  Pulse: (!) 56 78 87 62  Resp: '18  18 18    ' Temp: 98 F (36.7 C)  99.1 F (37.3 C) 98.6 F (37 C)  TempSrc:   Oral Oral  SpO2: 100%  100% 100%  Weight:      Height:        Intake/Output Summary (Last 24 hours) at 02/21/2020 1001 Last data filed at 02/21/2020 0950 Gross per 24 hour  Intake 200 ml  Output 450 ml  Net -250 ml   Filed Weights   02/15/20 1357 02/16/20 0054 02/19/20 2038  Weight: (!) 122.5 kg (!) 122.5 kg (!) 108.7 kg    Examination:  General exam: NAD Respiratory system: CTA Cardiovascular system: S 1, S 2 RRR Gastrointestinal system: BS present, soft,  Central nervous system: BS present, soft, nt Extremities: right LE with lymphedema chronic wound   Data Reviewed: I have personally reviewed following labs and imaging studies  CBC: Recent Labs  Lab 02/15/20 1430 02/15/20 2011 02/16/20 0441 02/17/20 0421 02/20/20 0645  WBC 6.2  --  4.8 4.7 4.6  NEUTROABS 4.9  --   --   --   --  HGB 11.0* 11.9* 10.5* 9.8* 9.8*  HCT 35.4* 35.0* 32.3* 29.8* 32.1*  MCV 90.5  --  85.2 84.4 88.9  PLT PLATELET CLUMPS NOTED ON SMEAR, UNABLE TO ESTIMATE  --  124* PLATELET CLUMPS NOTED ON SMEAR, COUNT APPEARS DECREASED 619*   Basic Metabolic Panel: Recent Labs  Lab 02/16/20 0109 02/16/20 0441 02/17/20 0421 02/17/20 1241 02/18/20 0420 02/18/20 1730 02/19/20 0406 02/20/20 0645 02/21/20 0410  NA 135   < > 138   < > 138 138 138 138 138  K 6.5*   < > 4.2   < > 3.8 3.9 3.7 3.6 4.0  CL 113*   < > 103   < > 106 105 105 105 103  CO2 10*   < > 26   < > '23 25 25 25 24  ' GLUCOSE 127*   < > 98   < > 103* 142* 101* 114* 99  BUN 153*   < > 52*   < > 54* 53* 48* 42* 38*  CREATININE 10.42*   < > 4.20*   < > 4.10* 3.86* 3.35* 2.86* 2.75*  CALCIUM 9.1   < > 8.4*   < > 8.6* 8.7* 8.7* 8.5* 8.7*  MG 2.4  --  1.8  --   --  2.0  --   --  1.7  PHOS 5.8*   < > 3.3  --  3.4  --  3.1 2.6 3.3   < > = values in this interval not displayed.   GFR: Estimated Creatinine Clearance: 30.9 mL/min (A) (by C-G formula based on SCr of 2.75  mg/dL (H)). Liver Function Tests: Recent Labs  Lab 02/15/20 1747 02/16/20 0441 02/17/20 0421 02/18/20 0420 02/19/20 0406 02/20/20 0645 02/21/20 0410  AST 13*  --   --   --   --   --  22  ALT 9  --   --   --   --   --  13  ALKPHOS 64  --   --   --   --   --  63  BILITOT 0.6  --   --   --   --   --  0.7  PROT 7.7  --   --   --   --   --  6.8  ALBUMIN 3.3*   < > 2.7* 2.6* 2.6* 2.6* 2.7*   < > = values in this interval not displayed.   No results for input(s): LIPASE, AMYLASE in the last 168 hours. No results for input(s): AMMONIA in the last 168 hours. Coagulation Profile: No results for input(s): INR, PROTIME in the last 168 hours. Cardiac Enzymes: Recent Labs  Lab 02/15/20 2230  CKTOTAL 230   BNP (last 3 results) No results for input(s): PROBNP in the last 8760 hours. HbA1C: No results for input(s): HGBA1C in the last 72 hours. CBG: Recent Labs  Lab 02/20/20 0621 02/20/20 1131 02/20/20 1639 02/20/20 2059 02/21/20 0651  GLUCAP 128* 94 102* 122* 90   Lipid Profile: No results for input(s): CHOL, HDL, LDLCALC, TRIG, CHOLHDL, LDLDIRECT in the last 72 hours. Thyroid Function Tests: No results for input(s): TSH, T4TOTAL, FREET4, T3FREE, THYROIDAB in the last 72 hours. Anemia Panel: No results for input(s): VITAMINB12, FOLATE, FERRITIN, TIBC, IRON, RETICCTPCT in the last 72 hours. Sepsis Labs: No results for input(s): PROCALCITON, LATICACIDVEN in the last 168 hours.  Recent Results (from the past 240 hour(s))  SARS Coronavirus 2 by RT PCR     Status: None  Collection Time: 02/15/20  4:09 PM  Result Value Ref Range Status   SARS Coronavirus 2 NEGATIVE NEGATIVE Final    Comment: (NOTE) Result indicates the ABSENCE of SARS-CoV-2 RNA in the patient specimen.   The lowest concentration of SARS-CoV-2 viral copies this assay can detect in nasopharyngeal swab specimens is 500 copies / mL.  A negative result does not preclude SARS-CoV-2 infection and should not be used  as the sole basis for patient management decisions. A negative result may occur with improper specimen collection / handling, submission of a specimen other than nasopharyngeal swab, presence of viral mutation(s) within the areas targeted by this assay, and inadequate number of viral copies (<500 copies / mL) present.  Negative results must be combined with clinical observations, patient history, and epidemiological information.   The expected result is NEGATIVE.   Patient Fact Sheet:  BlogSelections.co.uk    Provider Fact Sheet:  https://lucas.com/    This test is not yet approved or cleared by the [place] FDA and  has been [place] orized for detection and/or diagnosis of SARS-CoV-2 by FDA under an Emergency Use Authorization (EUA).  This EUA will remain in effect (meaning this test can be used) for the duration of  the COVID-19 declaration under Section 564(b)(1) of the Act, 21 U.S.C. section 360bbb-3(b)(1), unless the authorization is terminated or revoked sooner  Performed at [facility], [place] [address], [place], [place] [address]   MRSA PCR Screening     Status: None   Collection Time: 02/16/20  4:45 AM   Specimen: Nasopharyngeal  Result Value Ref Range Status   MRSA by PCR NEGATIVE NEGATIVE Final    Comment:        The GeneXpert MRSA Assay (FDA approved for NASAL specimens only), is one component of a comprehensive MRSA colonization surveillance program. It is not intended to diagnose MRSA infection nor to guide or monitor treatment for MRSA infections. Performed at [facility], [place] [address] [place], [place] [address]          Radiology Studies: DG Bone Survey Met  Result Date: 02/19/2020 CLINICAL DATA:  Multiple myeloma.  Weakness.  Altered mental status. EXAM: METASTATIC BONE SURVEY COMPARISON:  Head CT 02/15/2020. FINDINGS: There are 2 small lucent lesions in the distal femoral shaft and 2  small lucent lesions in the mid tibial shaft. Left knee arthroplasty. These lesions are typical of ghost tracks from prior fixator pins. There is no other radiographic evidence of focal bone lesion. Heterogeneous appearance in the lower lumbar spine that is nonspecific and likely accentuated by multilevel degenerative change. Diffuse cervical, thoracic, and lumbar degenerative change. No evidence of compression fracture. Aortic atherosclerosis. IMPRESSION: 1. Two small lucent lesions in the distal femoral shaft and 2 small lucent lesions in the mid tibial shaft. Suspect this is related to prior external fixator pin placement, recommend correlation with clinical history. Left knee arthroplasty. 2. There is otherwise no radiographic evidence of focal bone lesion. Electronically Signed   By: Keith Rake M.D.   On: 02/19/2020 15:34        Scheduled Meds: . (feeding supplement) PROSource Plus  30 mL Oral BID BM  . capsicum   Topical BID  . Chlorhexidine Gluconate Cloth  6 each Topical Q0600  . feeding supplement (NEPRO CARB STEADY)  237 mL Oral BID BM  . insulin aspart  0-5 Units Subcutaneous QHS  . insulin aspart  0-6 Units Subcutaneous TID WC  . metoprolol tartrate  25 mg Oral BID  . multivitamin  1 tablet Oral QHS  . pravastatin  40 mg Oral Daily  . sodium chloride flush  10-40 mL Intracatheter Q12H   Continuous Infusions:   LOS: 6 days    Time spent: 35 minutes.     Elmarie Shiley, MD [facility]   If 7PM-7AM, please contact night-coverage [email or web address]  02/21/2020, 10:01 AM

## 2020-02-21 NOTE — Progress Notes (Signed)
Nutrition Follow-up  DOCUMENTATION CODES:   Not applicable  INTERVENTION:  -Recommend liberalizing diet to Regular diet -Snacks TID -d/c Nepro Shake (pt refusing) -d/c ProSource Plus (pt refusing) -Continue Renal MVI daily  NUTRITION DIAGNOSIS:   Increased nutrient needs related to chronic illness (CHF, COPD) as evidenced by estimated needs.  Ongoing  GOAL:   Patient will meet greater than or equal to 90% of their needs  Progressing  MONITOR:   PO intake, Supplement acceptance, Labs, Weight trends, Skin, I & O's  REASON FOR ASSESSMENT:   Malnutrition Screening Tool    ASSESSMENT:   74 year old male who presented on 7/28 with weakness and AMS. PMH of COPD, DM, atrial fibrillation, HTN, HLD, HFpEF, BPH and prostate cancer s/p prostatectomy, remote nephrolithiasis. Admitted with significant AKI, metabolic acidosis, hyperkalemia.  7/28 iHD began 8/1 temporary HD catheter removed  Per Hematology, suspicion for multiple myeloma is high given acute onset of pt's renal failure. Bone marrow biopsy planned for tomorrow. Pt expressing anxiety over procedure.   Pt noted to be refusing supplements and multiple medications per RN. Will d/c supplements and order additional snacks to increase protein/kcal intake.   PO Intake: 0-100% x last 8 recorded meals (46% average meal intake)  Labs: CBGs 90-122 Medications: 88m Prosource Plus BID, Nepro shake po BID, Novolog, Rena-vit  Diet Order:   Diet Order            Diet renal with fluid restriction Fluid restriction: 1500 mL Fluid; Room service appropriate? Yes; Fluid consistency: Thin  Diet effective now                 EDUCATION NEEDS:   Education needs have been addressed  Skin:  Skin Assessment: Skin Integrity Issues: Skin Integrity Issues:: Stage II, DTI DTI: right leg Stage II: sacrum  Last BM:  8/2  Height:   Ht Readings from Last 1 Encounters:  02/15/20 _0  (1.854 m)    Weight:   Wt Readings from  Last 1 Encounters:  02/19/20 (!) 108.7 kg    Ideal Body Weight:  83.6 kg  BMI:  Body mass index is 31.62 kg/m.  Estimated Nutritional Needs:   Kcal:  2200-2400  Protein:  110-130 grams  Fluid:  1000 ml + UOP    ALarkin Ina MS, RD, LDN RD pager number and weekend/on-call pager number located in [email or web address]

## 2020-02-21 NOTE — TOC Initial Note (Signed)
Transition of Care [facility]) - Initial/Assessment Note    Patient Details  Name: [name] MRN: [id number] Date of Birth: 12-Jan-1946  [facility] CM/SW Contact:    Bartholomew Crews, RN Phone Number: [phone] 02/21/2020, 5:40 PM  Clinical Narrative:                  Spoke with patient at the bedside yesterday afternoon. PTA home with daughter. States that he has a walker at home. He declines need for 3N1 at this time. Discussed recommendations for home health PT/OT. Patient agreeable to [facility].   NCM has been unable to date to find a home health agency to accept referral for [facility] RN (wound care for diabetic ulcer), PT, OT.   TOC following for transition needs.   Expected Discharge Plan: [place] Barriers to Discharge: Continued Medical Work up   Patient Goals and CMS Choice Patient states their goals for this hospitalization and ongoing recovery are:: return home CMS Medicare.gov Compare Post Acute Care list provided to:: Patient Choice offered to / list presented to : Patient, Adult Children  Expected Discharge Plan and Services Expected Discharge Plan: [place] In-house Referral: Hospice / Palliative Care Discharge Planning Services: CM Consult Post Acute Care Choice: [place] arrangements for the past 2 months: Single Family Home                 DME Arranged: 3-N-1 (declined 3N1) DME Agency: NA                  Prior Living Arrangements/Services Living arrangements for the past 2 months: Single Family Home Lives with:: Self, Adult Children Patient language and need for interpreter reviewed:: Yes        Need for Family Participation in Patient Care: Yes (Comment) Care giver support system in place?: Yes (comment) Current home services: DME Criminal Activity/Legal Involvement Pertinent to Current Situation/Hospitalization: No - Comment as needed  Activities of Daily Living Home Assistive  Devices/Equipment: CBG Meter ADL Screening (condition at time of admission) Patient's cognitive ability adequate to safely complete daily activities?: Yes Is the patient deaf or have difficulty hearing?: No Does the patient have difficulty seeing, even when wearing glasses/contacts?: No Does the patient have difficulty concentrating, remembering, or making decisions?: Yes Patient able to express need for assistance with ADLs?: Yes Does the patient have difficulty dressing or bathing?: Yes Independently performs ADLs?: No Communication: Independent Dressing (OT): Needs assistance Is this a change from baseline?: Change from baseline, expected to last <3days Grooming: Needs assistance Is this a change from baseline?: Change from baseline, expected to last <3 days Feeding: Independent Bathing: Needs assistance Is this a change from baseline?: Change from baseline, expected to last <3 days Toileting: Needs assistance Is this a change from baseline?: Change from baseline, expected to last <3 days In/Out Bed: Needs assistance Is this a change from baseline?: Change from baseline, expected to last <3 days Walks in Home: Independent with device (comment) Does the patient have difficulty walking or climbing stairs?: Yes Weakness of Legs: Both Weakness of Arms/Hands: Both  Permission Sought/Granted                  Emotional Assessment Appearance:: Appears stated age Attitude/Demeanor/Rapport: Engaged Affect (typically observed): Accepting Orientation: : Oriented to Self, Oriented to  Time, Oriented to Place, Oriented to Situation Alcohol / Substance Use: Not Applicable Psych Involvement: No (comment)  Admission diagnosis:  General weakness [  R53.1] Acute renal failure (ARF) (HCC) [N17.9] Encounter for central line care [Z45.2] AKI (acute kidney injury) ([place]) [N17.9] Decubitus ulcer of sacral region, stage 1 [L89.151] Patient Active Problem List   Diagnosis Date Noted  .  Palliative care by specialist   . Multiple myeloma ([place])   . Pressure injury of skin 02/16/2020  . AKI (acute kidney injury) ([place]) 02/15/2020  . General weakness   . Diabetic leg ulcer ([place]) 10/06/2019  . CKD (chronic kidney disease) stage 3, GFR 30-59 ml/min 10/06/2019  . Diabetic ulcer of right lower leg ([place]) 02/23/2019  . Abnormal LFTs 04/27/2018  . Open wound of scrotum 03/01/2018  . Acute on chronic diastolic CHF (congestive heart failure) ([place]) 02/25/2018  . Postoperative anemia due to acute blood loss 02/25/2018  . Occipital stroke ([place]), left 09/23/2017  . Diabetes mellitus type II, non insulin dependent ([place])   . Encounter for well adult exam with abnormal findings 07/31/2017  . Swelling of lower extremity 04/04/2016  . Junctional tachycardia ([place]) 03/18/2015  . Exertional dyspnea 07/05/2014  . Obesity (BMI 30-39.9) 05/04/2014  . Benign essential tremor 05/04/2014  . Peripheral neuropathy 12/21/2013  . Nonspecific abnormal finding in stool contents 09/06/2012  . Heme positive stool 08/09/2012  . Difficulty passing stool 08/09/2012  . Sebaceous cyst 04/15/2012  . Complex renal cyst 01/13/2011  . Back pain 01/02/2011  . PAF (paroxysmal atrial fibrillation) - now off amiodarone; asymptomatic. CHA2DS2-VASc Score -5 (on Xarelto) 01/02/2011  . KNEE PAIN, LEFT 08/27/2009  . COLONIC POLYPS, HX OF 08/08/2008  . ERECTILE DYSFUNCTION 09/27/2007  . ULNAR NEUROPATHY 09/27/2007  . VENTRICULAR HYPERTROPHY, LEFT 09/27/2007  . ALLERGIC RHINITIS 09/27/2007  . ARTHRITIS 09/27/2007  . GANGLION CYST, WRIST, LEFT 09/27/2007  . HEMORRHOIDS, INTERNAL 06/10/2007  . DIVERTICULOSIS, COLON 06/10/2007  . [place] DISEASE, LUMBAR 06/03/2007  . NEPHROLITHIASIS, HX OF 06/03/2007  . Diabetes mellitus with neuropathy ([place]) 02/11/2007  . Hyperlipidemia with target LDL less than 100 02/11/2007  . ANXIETY 02/11/2007  . Nondependent Alcohol Abuse, in Remission 02/11/2007  . Hypertensive heart disease  with chronic diastolic congestive heart failure ([place]) 02/11/2007  . COPD (chronic obstructive pulmonary disease) ([place]) 02/11/2007  . BENIGN PROSTATIC HYPERTROPHY 02/11/2007  . LOW BACK PAIN 02/11/2007  . Disturbance of skin sensation 02/11/2007  . PROSTATE CANCER, HX OF 02/11/2007   PCP:  Biagio Borg, MD Pharmacy:   [facility] [place] [address] [place], [address] [place], [address] [name] [address] Phone: [phone] Fax: [phone]     Social Determinants of Health (SDOH) Interventions    Readmission Risk Interventions No flowsheet data found.

## 2020-02-21 NOTE — Progress Notes (Signed)
19 beat of VT. Patient is asymptomatic. Vital signs within normal limits. Dr.Regalado aware.

## 2020-02-21 NOTE — Progress Notes (Signed)
Admit: 02/15/2020 LOS: 6  32M severe renal failure, hyperkalemia, acidosis  Subjective:  . BUN and crt down but only 350 of UOP recorded - pt thinks more  . SPEP with small M spike, kappa lambda ratio 0.2 on SFLC- pt ended up refusing the bone marrow biopsy yesterday but now is going to do -  Scheduled for tomorrow . He is frustrated with the system  08/02 0701 - 08/03 0700 In: 320 [P.O.:320] Out: 350 [Urine:350]  Filed Weights   02/15/20 1357 02/16/20 0054 02/19/20 2038  Weight: (!) 122.5 kg (!) 122.5 kg (!) 108.7 kg    Scheduled Meds: . (feeding supplement) PROSource Plus  30 mL Oral BID BM  . apixaban  5 mg Oral BID  . capsicum   Topical BID  . Chlorhexidine Gluconate Cloth  6 each Topical Q0600  . feeding supplement (NEPRO CARB STEADY)  237 mL Oral BID BM  . insulin aspart  0-5 Units Subcutaneous QHS  . insulin aspart  0-6 Units Subcutaneous TID WC  . metoprolol tartrate  25 mg Oral BID  . multivitamin  1 tablet Oral QHS  . pravastatin  40 mg Oral Daily  . sodium chloride flush  10-40 mL Intracatheter Q12H   Continuous Infusions:  PRN Meds:.docusate sodium, lip balm, Muscle Rub, polyethylene glycol, sodium chloride flush  Current Labs: reviewed  Results for [name], [name] (MRN [id number]) as of 02/18/2020 11:00  Ref. Range 02/16/2020 12:22  Kappa, lamda light chain ratio Latest Ref Range: 0.26 - 1.65  0.23 (L)    Physical Exam:  Blood pressure 121/74, pulse 62, temperature 98.6 F (37 C), temperature source Oral, resp. rate 18, height _0  (1.854 m), weight (!) 108.7 kg, SpO2 100 %. Stronger appearing RLE bandaged LLE trace edema RRR ctab Coarse bs b/l S/nt/nd Nonfocal  A/P 1. AKI with baseline CKD3 ( some worsening earlier this year- in March was 2.17), now seems had significant acute component, likely driven by hypovolemia while using ACE inhibitor and NSAIDs; required HD 7/28 and 7/29 but now recovered-  Removed temp cath. No further HD needed.  From a  renal standpoint I would feel comfortable with discharge- Will need outpatient follow-up at CKD upon discharge- I will arrange 2. SPEP with M spike, borderline abnormal ratio of serum free light chains: hem onc has seen- wantd bone marrow biopsy but patient has refused for now 3. Hyperkalemia-  resolved 4. Metabolic Acidosis from #1, resolved 5. Anemia, mild, fairly stable 6. DM2 7. RLE leg wound seeing wound care 8. HfPEF 9. Hx/o AFib 10. Hx/o BPH and prostatic Ca s/p prostacectomy     [name] A [name]  02/21/2020, 11:15 AM  Recent Labs  Lab 02/19/20 0406 02/20/20 0645 02/21/20 0410  NA 138 138 138  K 3.7 3.6 4.0  CL 105 105 103  CO2 _1 GLUCOSE 101* 114* 99  BUN 48* 42* 38*  CREATININE 3.35* 2.86* 2.75*  CALCIUM 8.7* 8.5* 8.7*  PHOS 3.1 2.6 3.3   Recent Labs  Lab 02/15/20 1430 02/15/20 2011 02/16/20 0441 02/17/20 0421 02/20/20 0645  WBC 6.2  --  4.8 4.7 4.6  NEUTROABS 4.9  --   --   --   --   HGB 11.0*   < > 10.5* 9.8* 9.8*  HCT 35.4*   < > 32.3* 29.8* 32.1*  MCV 90.5  --  85.2 84.4 88.9  PLT PLATELET CLUMPS NOTED ON SMEAR, UNABLE TO ESTIMATE  --  124* PLATELET CLUMPS NOTED  ON SMEAR, COUNT APPEARS DECREASED 127*   < > = values in this interval not displayed.

## 2020-02-21 NOTE — Progress Notes (Signed)
Patient had 11 beats of V-tach and 2.5 seconds pause this morning. Patient was sleeping and asymptomatic upon assessment and Dr. Clearence Ped notified via [place]. Orders received in Epic, will continue to monitor.   Heidy Mccubbin,RN.

## 2020-02-21 NOTE — Progress Notes (Addendum)
Progress Note  Patient Name: [name] Date of Encounter: 02/21/2020  Primary Cardiologist: Glenetta Hew, MD   Subjective   Asked to come back and see patient again today due to recurrent wide complex tachycardia.  Patient is asymptomatic  Inpatient Medications    Scheduled Meds: . (feeding supplement) PROSource Plus  30 mL Oral BID BM  . apixaban  5 mg Oral BID  . capsicum   Topical BID  . Chlorhexidine Gluconate Cloth  6 each Topical Q0600  . feeding supplement (NEPRO CARB STEADY)  237 mL Oral BID BM  . insulin aspart  0-5 Units Subcutaneous QHS  . insulin aspart  0-6 Units Subcutaneous TID WC  . metoprolol tartrate  25 mg Oral BID  . multivitamin  1 tablet Oral QHS  . pravastatin  40 mg Oral Daily  . sodium chloride flush  10-40 mL Intracatheter Q12H   Continuous Infusions:  PRN Meds: docusate sodium, lip balm, Muscle Rub, polyethylene glycol, sodium chloride flush   Vital Signs    Vitals:   02/20/20 2250 02/21/20 0445 02/21/20 0831 02/21/20 1226  BP:  114/68 121/74 (!) 111/57  Pulse: 78 87 62 (!) 48  Resp:  18 18 20   Temp:  99.1 F (37.3 C) 98.6 F (37 C) 99.5 F (37.5 C)  TempSrc:  Oral Oral Oral  SpO2:  100% 100% 97%  Weight:      Height:        Intake/Output Summary (Last 24 hours) at 02/21/2020 1509 Last data filed at 02/21/2020 1330 Gross per 24 hour  Intake 520 ml  Output 700 ml  Net -180 ml   Filed Weights   02/15/20 1357 02/16/20 0054 02/19/20 2038  Weight: (!) 122.5 kg (!) 122.5 kg (!) 108.7 kg    Telemetry    Atrial fibrillation with 8 and 10  beat run of WCT - Personally Reviewed  ECG    No new EKG to review - Personally Reviewed  Physical Exam   GEN: No acute distress.   Neck: No JVD Cardiac: RRR, no murmurs, rubs, or gallops.  Respiratory: Clear to auscultation bilaterally. GI: Soft, nontender, non-distended  MS: No edema; No deformity. Neuro:  Nonfocal  Psych: Normal affect   Labs    Chemistry Recent Labs    Lab 02/15/20 1747 02/15/20 2011 02/19/20 0406 02/20/20 0645 02/21/20 0410  NA 133*   < > 138 138 138  K >7.5*   < > 3.7 3.6 4.0  CL 111   < > 105 105 103  CO2 9*   < > 25 25 24   GLUCOSE 88   < > 101* 114* 99  BUN 168*   < > 48* 42* 38*  CREATININE 11.66*   < > 3.35* 2.86* 2.75*  CALCIUM 9.2   < > 8.7* 8.5* 8.7*  PROT 7.7  --   --   --  6.8  ALBUMIN 3.3*   < > 2.6* 2.6* 2.7*  AST 13*  --   --   --  22  ALT 9  --   --   --  13  ALKPHOS 64  --   --   --  63  BILITOT 0.6  --   --   --  0.7  GFRNONAA 4*   < > 17* 21* 22*  GFRAA 4*   < > 20* 24* 25*  ANIONGAP 13   < > 8 8 11    < > = values in this interval not  displayed.     Hematology Recent Labs  Lab 02/16/20 0441 02/17/20 0421 02/20/20 0645  WBC 4.8 4.7 4.6  RBC 3.79* 3.53* 3.61*  HGB 10.5* 9.8* 9.8*  HCT 32.3* 29.8* 32.1*  MCV 85.2 84.4 88.9  MCH 27.7 27.8 27.1  MCHC 32.5 32.9 30.5  RDW 14.7 14.5 14.4  PLT 124* PLATELET CLUMPS NOTED ON SMEAR, COUNT APPEARS DECREASED 127*    Cardiac EnzymesNo results for input(s): TROPONINI in the last 168 hours. No results for input(s): TROPIPOC in the last 168 hours.   BNPNo results for input(s): BNP, PROBNP in the last 168 hours.   DDimer No results for input(s): DDIMER in the last 168 hours.   Radiology    No results found.  Cardiac Studies   2D echo 01/2020 IMPRESSIONS    1. Left ventricular ejection fraction, by estimation, is 60 to 65%. The  left ventricle has normal function. The left ventricle has no regional  wall motion abnormalities. There is mild concentric left ventricular  hypertrophy. Left ventricular diastolic  parameters are indeterminate due to underlying atrial fibrillation.  2. Right ventricular systolic function is normal. The right ventricular  size is normal. There is normal pulmonary artery systolic pressure. The  estimated right ventricular systolic pressure is 33.8 mmHg.  3. Left atrial size was mildly dilated.  4. The mitral valve is  normal in structure. Trivial mitral valve  regurgitation. No evidence of mitral stenosis.  5. The aortic valve is tricuspid. Aortic valve regurgitation is not  visualized. Mild aortic valve sclerosis is present, with no evidence of  aortic valve stenosis.  6. Aortic dilatation noted. There is mild dilatation of the aortic root  and of the ascending aorta measuring 40 mm and 19mm respectively.  7. The inferior vena cava is normal in size with greater than 50%  respiratory variability, suggesting right atrial pressure of 3 mmHg.   Patient Profile     74 y.o. male with a hx of DM and HTN who is seen for NSVT.  Assessment & Plan    1.  NSVT -Cardiology saw patient last week and recommended no further workup given his normal LVF and no chest pain.   -started on BB -would not recommend AADT in setting of normal LVF -he is completely asymptomatic but has not had an ischemic workup in some time -will make NPO after MN for Lexiscan myoview in am.   -he BP is soft and therefore cannot titrate BB any further -continue current management with Lopressor 25mg  BID  2.  Chronic atrial fibrillation -rate controlled -continue BB and Apixaban  For questions or updates, please contact [place] Please consult [email or web address] for contact info under Cardiology/STEMI.      Signed, Fransico Him, MD  02/21/2020, 3:09 PM

## 2020-02-22 ENCOUNTER — Ambulatory Visit: Payer: Medicare Other | Admitting: Podiatry

## 2020-02-22 ENCOUNTER — Inpatient Hospital Stay (HOSPITAL_COMMUNITY): Payer: Medicare Other

## 2020-02-22 ENCOUNTER — Encounter (HOSPITAL_BASED_OUTPATIENT_CLINIC_OR_DEPARTMENT_OTHER): Payer: Medicare Other | Admitting: Physician Assistant

## 2020-02-22 DIAGNOSIS — I482 Chronic atrial fibrillation, unspecified: Secondary | ICD-10-CM

## 2020-02-22 DIAGNOSIS — I472 Ventricular tachycardia: Secondary | ICD-10-CM

## 2020-02-22 DIAGNOSIS — I4729 Other ventricular tachycardia: Secondary | ICD-10-CM

## 2020-02-22 LAB — CBC WITH DIFFERENTIAL/PLATELET
Abs Immature Granulocytes: 0.02 10*3/uL (ref 0.00–0.07)
Basophils Absolute: 0 10*3/uL (ref 0.0–0.1)
Basophils Relative: 0 %
Eosinophils Absolute: 0.1 10*3/uL (ref 0.0–0.5)
Eosinophils Relative: 3 %
HCT: 34.9 % — ABNORMAL LOW (ref 39.0–52.0)
Hemoglobin: 10.6 g/dL — ABNORMAL LOW (ref 13.0–17.0)
Immature Granulocytes: 1 %
Lymphocytes Relative: 26 %
Lymphs Abs: 1.1 10*3/uL (ref 0.7–4.0)
MCH: 27.7 pg (ref 26.0–34.0)
MCHC: 30.4 g/dL (ref 30.0–36.0)
MCV: 91.1 fL (ref 80.0–100.0)
Monocytes Absolute: 0.4 10*3/uL (ref 0.1–1.0)
Monocytes Relative: 11 %
Neutro Abs: 2.4 10*3/uL (ref 1.7–7.7)
Neutrophils Relative %: 59 %
Platelets: 137 10*3/uL — ABNORMAL LOW (ref 150–400)
RBC: 3.83 MIL/uL — ABNORMAL LOW (ref 4.22–5.81)
RDW: 14.6 % (ref 11.5–15.5)
WBC: 4.1 10*3/uL (ref 4.0–10.5)
nRBC: 0 % (ref 0.0–0.2)

## 2020-02-22 LAB — GLUCOSE, CAPILLARY
Glucose-Capillary: 88 mg/dL (ref 70–99)
Glucose-Capillary: 103 mg/dL — ABNORMAL HIGH (ref 70–99)
Glucose-Capillary: 93 mg/dL (ref 70–99)
Glucose-Capillary: 86 mg/dL (ref 70–99)

## 2020-02-22 LAB — RENAL FUNCTION PANEL
Albumin: 2.8 g/dL — ABNORMAL LOW (ref 3.5–5.0)
Anion gap: 11 (ref 5–15)
BUN: 34 mg/dL — ABNORMAL HIGH (ref 8–23)
CO2: 22 mmol/L (ref 22–32)
Calcium: 8.8 mg/dL — ABNORMAL LOW (ref 8.9–10.3)
Chloride: 104 mmol/L (ref 98–111)
Creatinine, Ser: 2.53 mg/dL — ABNORMAL HIGH (ref 0.61–1.24)
GFR calc Af Amer: 28 mL/min — ABNORMAL LOW (ref >60)
GFR calc non Af Amer: 24 mL/min — ABNORMAL LOW (ref >60)
Glucose, Bld: 99 mg/dL (ref 70–99)
Phosphorus: 3.1 mg/dL (ref 2.5–4.6)
Potassium: 3.8 mmol/L (ref 3.5–5.1)
Sodium: 137 mmol/L (ref 135–145)

## 2020-02-22 LAB — NM MYOCAR MULTI W/SPECT W/WALL MOTION / EF
Estimated workload: 1 METS
Exercise duration (min): 0 min
Exercise duration (sec): 0 s
MPHR: 147 {beats}/min
Peak HR: 93 {beats}/min
Percent HR: 63 %
Rest HR: 70 {beats}/min
TID: 0.89

## 2020-02-22 MED ORDER — TECHNETIUM TC 99M TETROFOSMIN IV KIT
9.0500 | PACK | Freq: Once | INTRAVENOUS | Status: AC | PRN
  Administered 2020-02-22: 9.05 via INTRAVENOUS

## 2020-02-22 MED ORDER — REGADENOSON 0.4 MG/5ML IV SOLN
0.4000 mg | Freq: Once | INTRAVENOUS | Status: AC
  Administered 2020-02-22: 0.4 mg via INTRAVENOUS
  Filled 2020-02-22: qty 5

## 2020-02-22 MED ORDER — TECHNETIUM TC 99M TETROFOSMIN IV KIT
30.1000 | PACK | Freq: Once | INTRAVENOUS | Status: AC | PRN
  Administered 2020-02-22: 30.1 via INTRAVENOUS

## 2020-02-22 MED ORDER — REGADENOSON 0.4 MG/5ML IV SOLN
INTRAVENOUS | Status: AC
  Filled 2020-02-22: qty 5

## 2020-02-22 NOTE — Plan of Care (Signed)
  Problem: Education: Goal: Knowledge of General Education information will improve Description: Including pain rating scale, medication(s)/side effects and non-pharmacologic comfort measures Outcome: Completed/Met

## 2020-02-22 NOTE — Progress Notes (Signed)
   Patient presented for a nuclear stress test today. No immediate complications. Stress imaging is pending at this time.   Preliminary EKG findings may be listed in the chart, but the stress test result will not be finalized until perfusion imaging is complete.  Darreld Mclean, PA-C 02/22/2020 2:29 PM

## 2020-02-22 NOTE — Progress Notes (Signed)
Per nursing staff, patient is now in agreement to proceed with BM biopsy in the morning. Patient is NPO after midnight and will be scheduled for image-guided BM biopsy tomorrow morning, 02/23/20.  Soyla Dryer, [place] [phone] 02/22/2020, 3:14 PM

## 2020-02-22 NOTE — Progress Notes (Signed)
Called for pt to come to radiology for scheduled bm bx. Pt refusing to come down at this time. Rad PA aware.

## 2020-02-22 NOTE — Progress Notes (Signed)
Admit: 02/15/2020 LOS: 7  41M severe renal failure, hyperkalemia, acidosis  Subjective:  . BUN and crt down again -  650 of UOP recorded - pt thinks more  . SPEP with small M spike, kappa lambda ratio 0.2 on SFLC- pt refusing the bone marrow biopsy again . He is frustrated with the system-  Cardiology then got called for some wide complex tachycardia-  They want to do stress test - heading down there now   08/03 0701 - 08/04 0700 In: 520 [P.O.:520] Out: 650 [Urine:650]  Filed Weights   02/15/20 1357 02/16/20 0054 02/19/20 2038  Weight: (!) 122.5 kg (!) 122.5 kg (!) 108.7 kg    Scheduled Meds: . apixaban  5 mg Oral BID  . capsicum   Topical BID  . Chlorhexidine Gluconate Cloth  6 each Topical Q0600  . insulin aspart  0-5 Units Subcutaneous QHS  . insulin aspart  0-6 Units Subcutaneous TID WC  . metoprolol tartrate  25 mg Oral BID  . multivitamin  1 tablet Oral QHS  . pravastatin  40 mg Oral Daily  . sodium chloride flush  10-40 mL Intracatheter Q12H   Continuous Infusions:  PRN Meds:.docusate sodium, lip balm, Muscle Rub, polyethylene glycol, sodium chloride flush  Current Labs: reviewed  Results for OAKLEN, THIAM (MRN [id number]) as of 02/18/2020 11:00  Ref. Range 02/16/2020 12:22  Kappa, lamda light chain ratio Latest Ref Range: 0.26 - 1.65  0.23 (L)    Physical Exam:  Blood pressure 140/87, pulse (!) 55, temperature 98 F (36.7 C), temperature source Oral, resp. rate 18, height '6\' 1"'  (1.854 m), weight (!) 108.7 kg, SpO2 98 %. Stronger appearing RLE bandaged LLE trace edema RRR ctab Coarse bs b/l S/nt/nd Nonfocal  A/P 1. AKI with baseline CKD3 ( some worsening earlier this year- in March was 2.17), now seems had significant acute component, likely driven by hypovolemia while using ACE inhibitor and NSAIDs; required HD 7/28 and 7/29 but now recovered-  Removed temp cath. No further HD needed.  From a renal standpoint he is stable and I would feel comfortable with  discharge- Will need outpatient follow-up at CKD upon discharge- I will arrange 2. SPEP with M spike, borderline abnormal ratio of serum free light chains: hem onc has seen- wanted bone marrow biopsy but patient has refused for now 3. Hyperkalemia-  resolved 4. Metabolic Acidosis from #1, resolved 5. Anemia, mild, fairly stable 6. DM2 7. RLE leg wound seeing wound care 8. HfPEF 9. Hx/o AFib with now wide complex tachy on monitor without sxms-  Cards now involved 10. Hx/o BPH and prostatic Ca s/p prostacectomy  Renal will sign off and arrange OP follow up-  Will leave other issues to primary team      Louis Meckel  02/22/2020, 10:15 AM  Recent Labs  Lab 02/20/20 0645 02/21/20 0410 02/22/20 0607  NA 138 138 137  K 3.6 4.0 3.8  CL 105 103 104  CO2 '25 24 22  ' GLUCOSE 114* 99 99  BUN 42* 38* 34*  CREATININE 2.86* 2.75* 2.53*  CALCIUM 8.5* 8.7* 8.8*  PHOS 2.6 3.3 3.1   Recent Labs  Lab 02/15/20 1430 02/15/20 2011 02/17/20 0421 02/20/20 0645 02/22/20 0828  WBC 6.2   < > 4.7 4.6 4.1  NEUTROABS 4.9  --   --   --  2.4  HGB 11.0*   < > 9.8* 9.8* 10.6*  HCT 35.4*   < > 29.8* 32.1* 34.9*  MCV 90.5   < >  84.4 88.9 91.1  PLT PLATELET CLUMPS NOTED ON SMEAR, UNABLE TO ESTIMATE   < > PLATELET CLUMPS NOTED ON SMEAR, COUNT APPEARS DECREASED 127* 137*   < > = values in this interval not displayed.

## 2020-02-22 NOTE — Progress Notes (Signed)
PROGRESS NOTE    [name]  DTO:[id number] DOB: May 09, 1946 DOA: 02/15/2020 PCP: Biagio Borg, MD   Brief Narrative: 74 year old with past medical history significant for diabetes, hypertension COPD, CKD stage IIIb (cr 1.3--2.1), A. fib who presents with AKI, metabolic acidosis and hyperkalemia.  Patient required  hemodialysis this admission, currently he has been off HD. Plan is to monitor renal function.  Patient presented to [facility] ED 7/28 with generalized weakness for a few days and altered mental status.   Patient admitted with metabolic acidosis and AKI on CKD stage IIIb he required CRRT on admission and  underwent  hemodialysis x2.  His kidney function recover.  Temporary hemodialysis catheter was removed.  He was found to have M spike. Plan for bone marrow biopsy 8/04.   Assessment & Plan:   Severe AKI on CKD stage IIIb; Critical ill on presentation; -Prior Cr range (1.3--2.1) -Presented with metabolic acidosis CO2 9, hyperkalemia potassium 7.5, 168, creatinine 11 -Patient received IV calcium, insulin, dextrose, IV bicarb drip in the ED. -Started on CRRT on admission.  -Subsequently underwent hemodialysis -SPEP and immunofixation notable for M spike, bone marrow survey was negative -Kidney function slowly improving, appreciate nephrology consult, HD catheter removed on 8/1, nephrology will arrange follow-up  M spike;  Bone surveay; negative.  Dr Burr Medico consulted.  IR consulted for bone marrow biopsy, patient declined this last minute this afternoon, agreeable to have this tomorrow  Metabolic acidosis: -Resolved  Acute metabolic encephalopathy: Multifactorial related to acidosis and uremia: CT head negative. Improved.   History of paroxysmal A. fib, hypertension hyperlipidemia He was transiently on heparin now back on Eliquis Ok to continue with eliquis, no need to hold for BM biopsy.   Diabetes: Continue with a sliding scale insulin. -Hold metformin. Need  to discontinue Metformin at discharge   History of COPD: Continue with nebulizer.  History of left lower extremity wound: Receiving hyperbaric oxygen therapy as an outpatient. Wound care consult appreciated, follow-up with the wound center  Nonsustained V. tach Metoprolol increase to 25 mg BID Troponin mild elevated, per cardiology non specific. ECHO normal.  -Electrolytes stabilized -Plan for stress test this afternoon  Acute hypoxic Respiratory  Failure: Related to atelectasis./  Incentive spirometry. On 2 L  Already on blood thinner.  Off oxygen today.   LE wound;  Wound care consulted and following.   Pressure Injury 02/16/20 Sacrum Bilateral Stage 2 -  Partial thickness loss of dermis presenting as a shallow open injury with a red, pink wound bed without slough. (Active)  02/16/20 0400  Location: Sacrum  Location Orientation: Bilateral  Staging: Stage 2 -  Partial thickness loss of dermis presenting as a shallow open injury with a red, pink wound bed without slough.  Wound Description (Comments):   Present on Admission: Yes     Pressure Injury 02/16/20 Leg Right Deep Tissue Pressure Injury - Purple or maroon localized area of discolored intact skin or blood-filled blister due to damage of underlying soft tissue from pressure and/or shear. (Active)  02/16/20   Location: Leg  Location Orientation: Right  Staging: Deep Tissue Pressure Injury - Purple or maroon localized area of discolored intact skin or blood-filled blister due to damage of underlying soft tissue from pressure and/or shear.  Wound Description (Comments):   Present on Admission: Yes     Pressure Injury 02/16/20 Right Stage 2 -  Partial thickness loss of dermis presenting as a shallow open injury with a red, pink wound bed without slough. (  Active)  02/16/20 2200  Location:   Location Orientation: Right  Staging: Stage 2 -  Partial thickness loss of dermis presenting as a shallow open injury with a red,  pink wound bed without slough.  Wound Description (Comments):   Present on Admission: Yes     Pressure Injury 02/16/20 Buttocks Right;Lateral Stage 2 -  Partial thickness loss of dermis presenting as a shallow open injury with a red, pink wound bed without slough. (Active)  02/16/20 2200  Location: Buttocks  Location Orientation: Right;Lateral  Staging: Stage 2 -  Partial thickness loss of dermis presenting as a shallow open injury with a red, pink wound bed without slough.  Wound Description (Comments):   Present on Admission: Yes     Nutrition Problem: Increased nutrient needs Etiology: chronic illness (CHF, COPD)    Signs/Symptoms: estimated needs    Interventions: MVI, Nepro shake, Liberalize Diet  Estimated body mass index is 31.62 kg/m as calculated from the following:   Height as of this encounter: '6\' 1"'  (1.854 m).   Weight as of this encounter: 108.7 kg.   DVT prophylaxis: Eliquis Code Status: Full code Family Communication: Daughter at bedside Disposition Plan:  Status is: Inpatient  Remains inpatient appropriate because: Ongoing work-up, pending Myoview and bone marrow biopsy   Dispo: The patient is from: Home              Anticipated d/c is to: Home              Anticipated d/c date is: 1 day              Patient currently is not medically stable to d/c.  Pending stress test and bone marrow biopsy       Consultants:   Nephrology  CCM  Cardiology  Procedures:  CRRT HD Antimicrobials:    Subjective: -Feels okay, denies any specific complaints, upset about bone marrow biopsy and stress test today  Objective: Vitals:   02/22/20 1417 02/22/20 1422 02/22/20 1424 02/22/20 1426  BP: 139/67 (!) 163/79 134/72 137/70  Pulse: 67 85 84 74  Resp:      Temp:      TempSrc:      SpO2:      Weight:      Height:        Intake/Output Summary (Last 24 hours) at 02/22/2020 1527 Last data filed at 02/22/2020 1136 Gross per 24 hour  Intake 0 ml    Output 700 ml  Net -700 ml   Filed Weights   02/15/20 1357 02/16/20 0054 02/19/20 2038  Weight: (!) 122.5 kg (!) 122.5 kg (!) 108.7 kg    Examination:  Gen: Elderly male sitting up in bed, AAOx3, no distress HEENT: No JVD CVS: S1-S2, regular rate rhythm Lungs: Decreased breath sounds the bases otherwise clear Abdomen: Soft, nontender, bowel sounds present Extremities: right LE with lymphedema chronic wound   Data Reviewed: I have personally reviewed following labs and imaging studies  CBC: Recent Labs  Lab 02/15/20 2011 02/16/20 0441 02/17/20 0421 02/20/20 0645 02/22/20 0828  WBC  --  4.8 4.7 4.6 4.1  NEUTROABS  --   --   --   --  2.4  HGB 11.9* 10.5* 9.8* 9.8* 10.6*  HCT 35.0* 32.3* 29.8* 32.1* 34.9*  MCV  --  85.2 84.4 88.9 91.1  PLT  --  124* PLATELET CLUMPS NOTED ON SMEAR, COUNT APPEARS DECREASED 127* 188*   Basic Metabolic Panel: Recent Labs  Lab 02/16/20 0109  02/16/20 0441 02/17/20 0421 02/17/20 1241 02/18/20 0420 02/18/20 0420 02/18/20 1730 02/19/20 0406 02/20/20 0645 02/21/20 0410 02/22/20 0607  NA 135   < > 138   < > 138   < > 138 138 138 138 137  K 6.5*   < > 4.2   < > 3.8   < > 3.9 3.7 3.6 4.0 3.8  CL 113*   < > 103   < > 106   < > 105 105 105 103 104  CO2 10*   < > 26   < > 23   < > '25 25 25 24 22  ' GLUCOSE 127*   < > 98   < > 103*   < > 142* 101* 114* 99 99  BUN 153*   < > 52*   < > 54*   < > 53* 48* 42* 38* 34*  CREATININE 10.42*   < > 4.20*   < > 4.10*   < > 3.86* 3.35* 2.86* 2.75* 2.53*  CALCIUM 9.1   < > 8.4*   < > 8.6*   < > 8.7* 8.7* 8.5* 8.7* 8.8*  MG 2.4  --  1.8  --   --   --  2.0  --   --  1.7  --   PHOS 5.8*   < > 3.3  --  3.4  --   --  3.1 2.6 3.3 3.1   < > = values in this interval not displayed.   GFR: Estimated Creatinine Clearance: 33.6 mL/min (A) (by C-G formula based on SCr of 2.53 mg/dL (H)). Liver Function Tests: Recent Labs  Lab 02/15/20 1747 02/16/20 0441 02/18/20 0420 02/19/20 0406 02/20/20 0645 02/21/20 0410  02/22/20 0607  AST 13*  --   --   --   --  22  --   ALT 9  --   --   --   --  13  --   ALKPHOS 64  --   --   --   --  63  --   BILITOT 0.6  --   --   --   --  0.7  --   PROT 7.7  --   --   --   --  6.8  --   ALBUMIN 3.3*   < > 2.6* 2.6* 2.6* 2.7* 2.8*   < > = values in this interval not displayed.   No results for input(s): LIPASE, AMYLASE in the last 168 hours. No results for input(s): AMMONIA in the last 168 hours. Coagulation Profile: No results for input(s): INR, PROTIME in the last 168 hours. Cardiac Enzymes: Recent Labs  Lab 02/15/20 2230  CKTOTAL 230   BNP (last 3 results) No results for input(s): PROBNP in the last 8760 hours. HbA1C: No results for input(s): HGBA1C in the last 72 hours. CBG: Recent Labs  Lab 02/21/20 0651 02/21/20 1105 02/21/20 1659 02/21/20 2044 02/22/20 0727  GLUCAP 90  90 119* 100* 93 88   Lipid Profile: No results for input(s): CHOL, HDL, LDLCALC, TRIG, CHOLHDL, LDLDIRECT in the last 72 hours. Thyroid Function Tests: No results for input(s): TSH, T4TOTAL, FREET4, T3FREE, THYROIDAB in the last 72 hours. Anemia Panel: No results for input(s): VITAMINB12, FOLATE, FERRITIN, TIBC, IRON, RETICCTPCT in the last 72 hours. Sepsis Labs: No results for input(s): PROCALCITON, LATICACIDVEN in the last 168 hours.  Recent Results (from the past 240 hour(s))  SARS Coronavirus 2 by RT PCR     Status: None  Collection Time: 02/15/20  4:09 PM  Result Value Ref Range Status   SARS Coronavirus 2 NEGATIVE NEGATIVE Final    Comment: (NOTE) Result indicates the ABSENCE of SARS-CoV-2 RNA in the patient specimen.   The lowest concentration of SARS-CoV-2 viral copies this assay can detect in nasopharyngeal swab specimens is 500 copies / mL.  A negative result does not preclude SARS-CoV-2 infection and should not be used as the sole basis for patient management decisions. A negative result may occur with improper specimen collection / handling, submission  of a specimen other than nasopharyngeal swab, presence of viral mutation(s) within the areas targeted by this assay, and inadequate number of viral copies (<500 copies / mL) present.  Negative results must be combined with clinical observations, patient history, and epidemiological information.   The expected result is NEGATIVE.   Patient Fact Sheet:  BlogSelections.co.uk    Provider Fact Sheet:  https://lucas.com/    This test is not yet approved or cleared by the [place] FDA and  has been [place] orized for detection and/or diagnosis of SARS-CoV-2 by FDA under an Emergency Use Authorization (EUA).  This EUA will remain in effect (meaning this test can be used) for the duration of  the COVID-19 declaration under Section 564(b)(1) of the Act, 21 U.S.C. section 360bbb-3(b)(1), unless the authorization is terminated or revoked sooner  Performed at [facility], [place] [address], [place], [place] [address]   MRSA PCR Screening     Status: None   Collection Time: 02/16/20  4:45 AM   Specimen: Nasopharyngeal  Result Value Ref Range Status   MRSA by PCR NEGATIVE NEGATIVE Final    Comment:        The GeneXpert MRSA Assay (FDA approved for NASAL specimens only), is one component of a comprehensive MRSA colonization surveillance program. It is not intended to diagnose MRSA infection nor to guide or monitor treatment for MRSA infections. Performed at [facility], [place] [address], [place],  [address]          Radiology Studies: No results found.      Scheduled Meds: . apixaban  5 mg Oral BID  . capsicum   Topical BID  . Chlorhexidine Gluconate Cloth  6 each Topical Q0600  . insulin aspart  0-5 Units Subcutaneous QHS  . insulin aspart  0-6 Units Subcutaneous TID WC  . metoprolol tartrate  25 mg Oral BID  . multivitamin  1 tablet Oral QHS  . pravastatin  40 mg Oral Daily  . regadenoson      . sodium  chloride flush  10-40 mL Intracatheter Q12H   Continuous Infusions:   LOS: 7 days    Time spent: 25 minutes.     Domenic Polite, MD [facility]   If 7PM-7AM, please contact night-coverage [email or web address]  02/22/2020, 3:27 PM

## 2020-02-22 NOTE — Progress Notes (Signed)
PT Cancellation Note  Patient Details Name: [name] MRN: [id number] DOB: Aug 27, 1945   Cancelled Treatment:    Reason Eval/Treat Not Completed: Patient at procedure or test/unavailable. PT will continue to f/u with pt acutely as available.    Clearnce Sorrel Chantel Teti 02/22/2020, 2:04 PM

## 2020-02-22 NOTE — Evaluation (Signed)
Occupational Therapy Evaluation Patient Details Name: [name] MRN: [id number] DOB: Apr 21, 1946 Today's Date: 02/22/2020    History of Present Illness Pt is a 74 y/o male admitted secondary to confusion. Pt found to have a severe AKI on CKD stage IIIb, critical ill on presentation. Pt required HD during admission. PMH including but not limited to DM, HTN, CHF and CKD.   Clinical Impression   Pt able to ambulate to bathroom with min assist and RW, stood from elevated bed with min guard assist. Pt continues to demonstrate decreased awareness of deficits and safety and will need 24 hour care at home.     Follow Up Recommendations  Home health OT;Supervision/Assistance - 24 hour    Equipment Recommendations  3 in 1 bedside commode    Recommendations for Other Services       Precautions / Restrictions Precautions Precautions: Fall      Mobility Bed Mobility               General bed mobility comments: pt seated at EOB upon arrival  Transfers Overall transfer level: Needs assistance Equipment used: Rolling walker (2 wheeled) Transfers: Sit to/from Stand Sit to Stand: Min guard;From elevated surface         General transfer comment: pt using one hand on walker, one on bed, increased time to stand    Balance Overall balance assessment: Needs assistance Sitting-balance support: Feet supported Sitting balance-Leahy Scale: Fair       Standing balance-Leahy Scale: Poor Standing balance comment: can briefly release walker in static standing to manage underwear                           ADL either performed or assessed with clinical judgement   ADL Overall ADL's : Needs assistance/impaired                     Lower Body Dressing: Min guard (to pull down pants)   Toilet Transfer: Ambulation;RW;Minimal assistance   Toileting- Clothing Manipulation and Hygiene: Total assistance;Sit to/from stand       Functional mobility during ADLs:  Minimal assistance;Cueing for safety;Rolling walker General ADL Comments: pt without awareness of need for 3 in 1 over toilet to elevate     Vision         Perception     Praxis      Pertinent Vitals/Pain Pain Assessment: No/denies pain     Hand Dominance     Extremity/Trunk Assessment             Communication     Cognition Arousal/Alertness: Awake/alert Behavior During Therapy: WFL for tasks assessed/performed Overall Cognitive Status: Impaired/Different from baseline Area of Impairment: Safety/judgement                         Safety/Judgement: Decreased awareness of safety;Decreased awareness of deficits         General Comments       Exercises     Shoulder Instructions      Home Living                                          Prior Functioning/Environment                   OT Problem List:  OT Treatment/Interventions:      OT Goals(Current goals can be found in the care plan section) Acute Rehab OT Goals Patient Stated Goal: return to independence OT Goal Formulation: With patient Time For Goal Achievement: 03/03/20 Potential to Achieve Goals: Good  OT Frequency: Min 2X/week   Barriers to D/C:            Co-evaluation              AM-PAC OT "6 Clicks" Daily Activity     Outcome Measure Help from another person eating meals?: None Help from another person taking care of personal grooming?: A Little Help from another person toileting, which includes using toliet, bedpan, or urinal?: A Lot Help from another person bathing (including washing, rinsing, drying)?: A Lot Help from another person to put on and taking off regular upper body clothing?: A Little Help from another person to put on and taking off regular lower body clothing?: Total 6 Click Score: 15   End of Session Equipment Utilized During Treatment: Rolling walker;Gait belt  Activity Tolerance: Patient tolerated treatment  well Patient left: Other (comment) (in bathroom with NT)  OT Visit Diagnosis: Unsteadiness on feet (R26.81);Other abnormalities of gait and mobility (R26.89);Muscle weakness (generalized) (M62.81)                Time: 1660-6301 OT Time Calculation (min): 17 min Charges:  OT General Charges $OT Visit: 1 Visit OT Treatments $Self Care/Home Management : 8-22 mins  Nestor Lewandowsky, OTR/L Acute Rehabilitation Services Pager: [phone] Office: [phone]  Malka So 02/22/2020, 4:05 PM

## 2020-02-22 NOTE — Progress Notes (Signed)
I have personally reviewed nuclear images.  Patient has a fixed basal inferior defect and basal inferolateral defect likely related to diaphragmatic attenuation artifact.  This is a low risk scan.  He has not had any anginal sx.  Continue on BB and statin. No ASA due to DOAC.  He has followup scheduled for 8/27 with Dr. Ellyn Hack.  Will sign off .  Continue Eliquis 5mg  BID, Lopressor 25mg  BID and Pravastatin 40mg  daily.

## 2020-02-22 NOTE — Progress Notes (Signed)
Progress Note  Patient Name: [name] Date of Encounter: 02/22/2020  [facility] Cardiologist: Glenetta Hew, MD   Subjective   No acute overnight events. Patient denies any chest pain, shortness of breath, or palpitations. Had a long discussion with patient and daughter about stress test today. He does not understand why he needs this given he feels fine. I explained the reasoning. He is somewhat reluctant but is willing to proceed and states he just wants to get it over with.  Inpatient Medications    Scheduled Meds: . apixaban  5 mg Oral BID  . capsicum   Topical BID  . Chlorhexidine Gluconate Cloth  6 each Topical Q0600  . insulin aspart  0-5 Units Subcutaneous QHS  . insulin aspart  0-6 Units Subcutaneous TID WC  . metoprolol tartrate  25 mg Oral BID  . multivitamin  1 tablet Oral QHS  . pravastatin  40 mg Oral Daily  . sodium chloride flush  10-40 mL Intracatheter Q12H   Continuous Infusions:  PRN Meds: docusate sodium, lip balm, Muscle Rub, polyethylene glycol, sodium chloride flush   Vital Signs    Vitals:   02/21/20 1226 02/21/20 1704 02/21/20 2044 02/22/20 0508  BP: (!) 111/57 (!) 160/98 135/70 138/76  Pulse: (!) 48 76 82 67  Resp: '20 18 18 18  ' Temp: 99.5 F (37.5 C) 98 F (36.7 C) 98 F (36.7 C) 98 F (36.7 C)  TempSrc: Oral Oral  Oral  SpO2: 97% 98% 99% 98%  Weight:      Height:        Intake/Output Summary (Last 24 hours) at 02/22/2020 0856 Last data filed at 02/22/2020 2111 Gross per 24 hour  Intake 520 ml  Output 650 ml  Net -130 ml   Last 3 Weights 02/19/2020 02/16/2020 02/16/2020  Weight (lbs) 239 lb 10.2 oz (No Data) 270 lb 1 oz  Weight (kg) 108.7 kg (No Data) 122.5 kg      Telemetry    Atrial fibrillation with rates in the 60's to 80's. A few pauses noted with longest being 2.03 seconds. Runs of non-sustained VT noted - longest episodes was 20 beats on 8/3. Had another 6 beat run earlier this morning. - Personally Reviewed  ECG     No new ECG tracing today. - Personally Reviewed  Physical Exam   GEN: No acute distress.   Neck: No JVD. Cardiac: Irregular rhythm with normal rate. No murmurs, rubs, or gallops. Radial pulses 2+ and equal bilaterally. Respiratory: Clear to auscultation bilaterally. GI: Soft, non-tender, non-distended. Bowel sounds present. MS: Trace lower extremity edema. Right lower extremity wrapped and gauze on anterior shin. No deformity. Skin: Warm and dry. Neuro:  No focal deficits. Psych: Normal affect. Responds appropriately.  Labs    High Sensitivity Troponin:   Recent Labs  Lab 02/18/20 1730 02/18/20 1942  TROPONINIHS 67* 73*      Chemistry Recent Labs  Lab 02/15/20 1747 02/15/20 2011 02/20/20 0645 02/21/20 0410 02/22/20 0607  NA 133*   < > 138 138 137  K >7.5*   < > 3.6 4.0 3.8  CL 111   < > 105 103 104  CO2 9*   < > '25 24 22  ' GLUCOSE 88   < > 114* 99 99  BUN 168*   < > 42* 38* 34*  CREATININE 11.66*   < > 2.86* 2.75* 2.53*  CALCIUM 9.2   < > 8.5* 8.7* 8.8*  PROT 7.7  --   --  6.8  --   ALBUMIN 3.3*   < > 2.6* 2.7* 2.8*  AST 13*  --   --  22  --   ALT 9  --   --  13  --   ALKPHOS 64  --   --  63  --   BILITOT 0.6  --   --  0.7  --   GFRNONAA 4*   < > 21* 22* 24*  GFRAA 4*   < > 24* 25* 28*  ANIONGAP 13   < > '8 11 11   ' < > = values in this interval not displayed.     Hematology Recent Labs  Lab 02/17/20 0421 02/20/20 0645 02/22/20 0828  WBC 4.7 4.6 4.1  RBC 3.53* 3.61* 3.83*  HGB 9.8* 9.8* 10.6*  HCT 29.8* 32.1* 34.9*  MCV 84.4 88.9 91.1  MCH 27.8 27.1 27.7  MCHC 32.9 30.5 30.4  RDW 14.5 14.4 14.6  PLT PLATELET CLUMPS NOTED ON SMEAR, COUNT APPEARS DECREASED 127* 137*    BNPNo results for input(s): BNP, PROBNP in the last 168 hours.   DDimer No results for input(s): DDIMER in the last 168 hours.   Radiology    No results found.  Cardiac Studies   Echocardiogram 02/17/2020: Impressions: 1. Left ventricular ejection fraction, by estimation,  is 60 to 65%. The  left ventricle has normal function. The left ventricle has no regional  wall motion abnormalities. There is mild concentric left ventricular  hypertrophy. Left ventricular diastolic  parameters are indeterminate due to underlying atrial fibrillation.  2. Right ventricular systolic function is normal. The right ventricular  size is normal. There is normal pulmonary artery systolic pressure. The  estimated right ventricular systolic pressure is 42.6 mmHg.  3. Left atrial size was mildly dilated.  4. The mitral valve is normal in structure. Trivial mitral valve  regurgitation. No evidence of mitral stenosis.  5. The aortic valve is tricuspid. Aortic valve regurgitation is not  visualized. Mild aortic valve sclerosis is present, with no evidence of  aortic valve stenosis.  6. Aortic dilatation noted. There is mild dilatation of the aortic root  and of the ascending aorta measuring 40 mm and 35m respectively.  7. The inferior vena cava is normal in size with greater than 50%  respiratory variability, suggesting right atrial pressure of 3 mmHg.   Patient Profile     74y.o. male with a history of chronic diastolic CHF with chronic lower extremity edema, chroncic atrial fibrillation on Xarelto, COPD, hypertension, hyperlipidemia, type 2 diabetes mellitus, and prior alcohol abuse who was admitted on 02/15/2020 with acute renal failure with creatinine >11 and hyperkalemia with potassium 7.5 after presenting with weakness, confusion, and decreased appetite. He was started on hemodialysis. Cardiology consulted for further evaluation of non-sustained VT.  Assessment & Plan    Non-Sustained VT - Longest run being 20 beats on 8/3. Had another 6 beat run earlier today. Asymptomatic with this. - Echo LVEF of 60-65% with normal wall motion and mild LVH.  - Started on Lopressor 235mtwice daily this admission. Unable to up-titrate due to soft BP.  - Plan is for Myoview today.  Patient is somewhat reluctant about this and does not understand why he needs this given he is feeling fine. However, he is willing to proceed and states he just wants to get it over with.  Chronic Atrial Fibrillation - Rate controlled. - Continue Lopressor as above. - Previously on Xarelto at home but switched to  Eliquis 3m twice daily this admission.  Chronic Diastolic CHF - Echo as above.  - Appears euvolemic on exam. - Continue to monitor volume status closely. Would defer need for diuretics to Nephrology given acute renal failure.  Acute Renal Failure on CKD Stage III - Creatinine 11.66 and BUN 168 on admission. Most recent baseline creatinine around 1.8 to 2.1.  - Felt to be driven by hypovolemia while using ACEi and NSAIDS. - Required hemodialysis on 7/28 and 7/29 but now recovered.  - SPEP with M spike and borderline abnormal ration of serum free light chains. Hem-Onc was consulted and recommended bone marrow biopsy but patient refused. - Nephrology feels he is stable for discharge from renal standpoint and have arranged outpatient follow-up.   Hyperkalemia - Resolved.  Otherwise, per primary team.  For questions or updates, please contact [place] consult [email or web address] for contact info under        Signed, CDarreld Mclean PA-C  02/22/2020, 8:56 AM

## 2020-02-23 ENCOUNTER — Inpatient Hospital Stay (HOSPITAL_COMMUNITY): Payer: Medicare Other

## 2020-02-23 ENCOUNTER — Other Ambulatory Visit: Payer: Self-pay

## 2020-02-23 DIAGNOSIS — L89151 Pressure ulcer of sacral region, stage 1: Secondary | ICD-10-CM

## 2020-02-23 LAB — RENAL FUNCTION PANEL
Albumin: 2.6 g/dL — ABNORMAL LOW (ref 3.5–5.0)
Anion gap: 9 (ref 5–15)
BUN: 31 mg/dL — ABNORMAL HIGH (ref 8–23)
CO2: 23 mmol/L (ref 22–32)
Calcium: 8.6 mg/dL — ABNORMAL LOW (ref 8.9–10.3)
Chloride: 104 mmol/L (ref 98–111)
Creatinine, Ser: 2.33 mg/dL — ABNORMAL HIGH (ref 0.61–1.24)
GFR calc Af Amer: 31 mL/min — ABNORMAL LOW (ref >60)
GFR calc non Af Amer: 27 mL/min — ABNORMAL LOW (ref >60)
Glucose, Bld: 96 mg/dL (ref 70–99)
Phosphorus: 2.9 mg/dL (ref 2.5–4.6)
Potassium: 3.6 mmol/L (ref 3.5–5.1)
Sodium: 136 mmol/L (ref 135–145)

## 2020-02-23 LAB — CBC
HCT: 30.5 % — ABNORMAL LOW (ref 39.0–52.0)
Hemoglobin: 9.5 g/dL — ABNORMAL LOW (ref 13.0–17.0)
MCH: 27.8 pg (ref 26.0–34.0)
MCHC: 31.1 g/dL (ref 30.0–36.0)
MCV: 89.2 fL (ref 80.0–100.0)
Platelets: 141 10*3/uL — ABNORMAL LOW (ref 150–400)
RBC: 3.42 MIL/uL — ABNORMAL LOW (ref 4.22–5.81)
RDW: 14.3 % (ref 11.5–15.5)
WBC: 4.7 10*3/uL (ref 4.0–10.5)
nRBC: 0 % (ref 0.0–0.2)

## 2020-02-23 LAB — GLUCOSE, CAPILLARY
Glucose-Capillary: 89 mg/dL (ref 70–99)
Glucose-Capillary: 74 mg/dL (ref 70–99)

## 2020-02-23 MED ORDER — MIDAZOLAM HCL 2 MG/2ML IJ SOLN
INTRAMUSCULAR | Status: AC
  Filled 2020-02-23: qty 2

## 2020-02-23 MED ORDER — LIDOCAINE-EPINEPHRINE 1 %-1:100000 IJ SOLN
INTRAMUSCULAR | Status: AC
  Filled 2020-02-23: qty 1

## 2020-02-23 MED ORDER — ACETAMINOPHEN 325 MG PO TABS: 650.0000 mg | ORAL_TABLET | Freq: Four times a day (QID) | ORAL | Status: DC | PRN

## 2020-02-23 MED ORDER — LIDOCAINE HCL 1 % IJ SOLN
INTRAMUSCULAR | Status: AC
  Filled 2020-02-23: qty 20

## 2020-02-23 MED ORDER — MIDAZOLAM HCL 2 MG/2ML IJ SOLN
INTRAMUSCULAR | Status: AC | PRN
  Administered 2020-02-23 (×2): 1 mg via INTRAVENOUS

## 2020-02-23 MED ORDER — FENTANYL CITRATE (PF) 100 MCG/2ML IJ SOLN
INTRAMUSCULAR | Status: AC
  Filled 2020-02-23: qty 2

## 2020-02-23 MED ORDER — APIXABAN 5 MG PO TABS: 5.0000 mg | ORAL_TABLET | Freq: Two times a day (BID) | ORAL | 0 refills | Status: DC

## 2020-02-23 MED ORDER — POLYETHYLENE GLYCOL 3350 17 G PO PACK: 17.0000 g | PACK | Freq: Every day | ORAL | 0 refills | Status: DC | PRN

## 2020-02-23 MED ORDER — METOPROLOL TARTRATE 25 MG PO TABS: 25.0000 mg | ORAL_TABLET | Freq: Two times a day (BID) | ORAL | 0 refills | Status: DC

## 2020-02-23 MED ORDER — FENTANYL CITRATE (PF) 100 MCG/2ML IJ SOLN
INTRAMUSCULAR | Status: AC | PRN
  Administered 2020-02-23: 50 ug via INTRAVENOUS

## 2020-02-23 MED ORDER — ACETAMINOPHEN 325 MG PO TABS
650.0000 mg | ORAL_TABLET | Freq: Four times a day (QID) | ORAL | Status: DC | PRN
  Administered 2020-02-23: 650 mg via ORAL

## 2020-02-23 NOTE — TOC Transition Note (Addendum)
Transition of Care [facility]) - CM/SW Discharge Note   Patient Details  Name: [name] MRN: [id number] Date of Birth: 17-Jan-1946  Transition of Care [facility]) CM/SW Contact:  Bartholomew Crews, RN Phone Number: [phone] 02/23/2020, 11:00 AM   Clinical Narrative:     UPDATE: Spoke with [place], [place], for [facility]. She has spoken with patient who is agreeable to remote health follow up. Referral faxed.  Spoke with patient at the bedside to discuss transition home today. Patient wanting to go home. Discussed that the has no accepting home health agency. Offered to refer to [facility] for chronic case management, however, patient declines at this time. Encouraged follow up with PCP in 1-2 weeks - patient agreed. Patient has walker at home. No further TOC needs identified.    Final next level of care: Home/Self Care Barriers to Discharge: No Barriers Identified   Patient Goals and CMS Choice Patient states their goals for this hospitalization and ongoing recovery are:: return home CMS Medicare.gov Compare Post Acute Care list provided to:: Patient Choice offered to / list presented to : Patient  Discharge Placement                       Discharge Plan and Services In-house Referral: Hospice / Palliative Care Discharge Planning Services: CM Consult Post Acute Care Choice: Home Health          DME Arranged: N/A DME Agency: NA       HH Arranged: NA HH Agency: NA        Social Determinants of Health (SDOH) Interventions     Readmission Risk Interventions No flowsheet data found.

## 2020-02-23 NOTE — Discharge Summary (Addendum)
Physician Discharge Summary  [name] GUR:[id number] DOB: 1946-03-23 DOA: 02/15/2020  PCP: Biagio Borg, MD  Admit date: 02/15/2020 Discharge date: 02/23/2020  Time spent: 35 minutes  Recommendations for Outpatient Follow-up:  1. PCP in 1 week 2. Dr. Moshe Cipro [facility] in 1 to 2 weeks, assess need for diuretics at follow-up 3. Oncology Dr. Burr Medico in 7 to 10 days, follow-up bone marrow biopsy 4. Cardiology Dr. Ellyn Hack on 8/27   Discharge Diagnoses:  Active Problems:   AKI (acute kidney injury) ([place]) Chronic kidney disease stage IIIb Hyperkalemia Severe metabolic encephalopathy   Palliative care by specialist Abnormal SPEP, M spike, possible multiple myeloma   Nonsustained ventricular tachycardia (HCC)   Chronic atrial fibrillation (HCC)   NSVT (nonsustained ventricular tachycardia) (HCC)   Decubitus ulcer of sacral region, stage 1   Discharge Condition: Improved  Diet recommendation: Low-sodium, heart healthy  Filed Weights   02/15/20 1357 02/16/20 0054 02/19/20 2038  Weight: (!) 122.5 kg (!) 122.5 kg (!) 108.7 kg    History of present illness:  74 year old with past medical history significant for diabetes, hypertension COPD, CKD stage IIIb (cr 1.3--2.1), A. fib who presents with AKI, metabolic acidosis and hyperkalemia.  Patient required  hemodialysis this admission, currently he has been off HD. Plan is to monitor renal function.  Patient presented to [facility] ED 7/28 with generalized weakness for a few days and altered mental status.   Patient admitted with metabolic acidosis and AKI on CKD stage IIIb he required CRRT on admission   Hospital Course:   Severe AKI on CKD stage IIIb; Critical ill on presentation; Severe hyperkalemia -Prior Cr range (1.3--2.1) -Presented with metabolic acidosis CO2 9, potassium 7.5, BUN 168, creatinine 11, metabolic encephalopathy, this is felt to be secondary to hypovolemia worsened by ACE inhibitor and  NSAIDs, in addition component of myeloma is also suspected -Patient received IV calcium, insulin, dextrose, IV bicarb drip in the ED. -Nephrology consulted started on CRRT on admission.  -Subsequently treated with hemodialysis on 7/28 and 7/29, subsequently kidney function recovered, temporary dialysis catheter removed, creatinine improved to 2.3-2.5 range -Nephrology recommended outpatient follow-up which they will arrange, ACE inhibitor and diuretics discontinued at discharge -SPEP and immunofixation notable for M spike, bone marrow survey was negative -Follow-up with Dr. Moshe Cipro at [facility] was recommended at discharge however patient declined all services  Abnormal SPEP, M spike noted Bone survey; negative.  Dr Burr Medico consulted.  Consulted for bone marrow biopsy, patient refused this on multiple locations finally after much convincing from his daughters he agreed to bone marrow biopsy which was completed today, results are pending at discharge -Follow-up with Dr. Burr Medico at the cancer center in 1 week  Metabolic acidosis: -Resolved  Acute metabolic encephalopathy:  -Multifactorial related to acidosis and uremia: CT head negative. Improved.   History of paroxysmal A. fib, hypertension hyperlipidemia He was on Xarelto previously however this was discontinued in the setting of renal failure, subsequently transitioned to Eliquis -Continue metoprolol, Eliquis  Diabetes: Continue with a sliding scale insulin. -Discontinued Metformin at discharge on account of renal failure   History of COPD: Continue with nebulizer.  History of left lower extremity wound: Receiving hyperbaric oxygen therapy as an outpatient. Wound care consult appreciated, follow-up with the wound center  Nonsustained V. tach Metoprolol increase to 25 mg BID Troponin mild elevated, per cardiology non specific. ECHO normal.  -Electrolytes stabilized -Stress test was  completed yesterday evening, this was felt to be low  risk, cardiology recommended to continue metoprolol, pravastatin and Eliquis follow-up with Dr. Ellyn Hack on 8/27  Acute hypoxic Respiratory  Failure: Related to atelectasis./  -Weaned off O2    Consultants:   Nephrology  CCM  Cardiology  Procedures:  CRRT Hemodialysis Bone marrow aspiration biopsy  LE wound;  Wound care consulted and following.   Pressure Injury 02/16/20 Sacrum Bilateral Stage 2 -  Partial thickness loss of dermis presenting as a shallow open injury with a red, pink wound bed without slough. (Active)  02/16/20 0400  Location: Sacrum  Location Orientation: Bilateral  Staging: Stage 2 -  Partial thickness loss of dermis presenting as a shallow open injury with a red, pink wound bed without slough.  Wound Description (Comments):   Present on Admission: Yes     Pressure Injury 02/16/20 Leg Right Deep Tissue Pressure Injury - Purple or maroon localized area of discolored intact skin or blood-filled blister due to damage of underlying soft tissue from pressure and/or shear. (Active)  02/16/20   Location: Leg  Location Orientation: Right  Staging: Deep Tissue Pressure Injury - Purple or maroon localized area of discolored intact skin or blood-filled blister due to damage of underlying soft tissue from pressure and/or shear.  Wound Description (Comments):   Present on Admission: Yes     Pressure Injury 02/16/20 Right Stage 2 -  Partial thickness loss of dermis presenting as a shallow open injury with a red, pink wound bed without slough. (Active)  02/16/20 2200  Location:   Location Orientation: Right  Staging: Stage 2 -  Partial thickness loss of dermis presenting as a shallow open injury with a red, pink wound bed without slough.  Wound Description (Comments):   Present on Admission: Yes     Pressure Injury 02/16/20 Buttocks Right;Lateral Stage 2 -  Partial thickness loss of dermis presenting as a  shallow open injury with a red, pink wound bed without slough. (Active)  02/16/20 2200  Location: Buttocks  Location Orientation: Right;Lateral  Staging: Stage 2 -  Partial thickness loss of dermis presenting as a shallow open injury with a red, pink wound bed without slough.  Wound Description (Comments):   Present on Admission: Yes     Discharge Exam: Vitals:   02/23/20 1050 02/23/20 1120  BP: (!) 118/57 120/60  Pulse: 66 67  Resp: 18 18  Temp: 98.6 F (37 C) 98.6 F (37 C)  SpO2: 100% 100%    General: AAOx3 Cardiovascular: S1S2/RRR Respiratory: CTAB  Discharge Instructions   Discharge Instructions    AMB Referral to [place] Management   Complete by: As directed    Hospital referral:   Please assign to [facility] for complex care and disease management follow up calls and assess for further needs.  Questions please call:   Natividad Brood, RN BSN [facility] Liaison  [phone] business mobile phone Toll free office [phone]  Fax number: [phone] [email or web address]   Reason for consult: High risk follow up medication changes   Diagnoses of: Kidney Failure   Expected date of contact: 1-3 days (reserved for hospital discharges)   Diet - low sodium heart healthy   Complete by: As directed    Discharge wound care:   Complete by: As directed    As above   Increase activity slowly   Complete by: As directed      Allergies as of 02/23/2020      Reactions   Ciprofloxacin Other (See Comments)  All over weakness      Medication List    STOP taking these medications   amLODipine 10 MG tablet Commonly known as: NORVASC   benazepril 40 MG tablet Commonly known as: LOTENSIN   furosemide 40 MG tablet Commonly known as: LASIX   Ibuprofen 200 MG Caps   metFORMIN 500 MG 24 hr tablet Commonly known as: GLUCOPHAGE-XR   metoprolol succinate 100 MG 24 hr  tablet Commonly known as: TOPROL-XL   potassium chloride SA 20 MEQ tablet Commonly known as: KLOR-CON   spironolactone 25 MG tablet Commonly known as: ALDACTONE   Xarelto 20 MG Tabs tablet Generic drug: rivaroxaban     TAKE these medications   acetaminophen 325 MG tablet Commonly known as: TYLENOL Take 2 tablets (650 mg total) by mouth every 6 (six) hours as needed for mild pain or moderate pain.   Albuterol Sulfate 108 (90 Base) MCG/ACT Aepb Commonly known as: PROAIR RESPICLICK Inhale 2 puffs into the lungs every 6 (six) hours as needed. What changed: reasons to take this   apixaban 5 MG Tabs tablet Commonly known as: ELIQUIS Take 1 tablet (5 mg total) by mouth 2 (two) times daily.   glucose blood test strip Commonly known as: ONE TOUCH ULTRA TEST Use as instructed once per day E11.9   loratadine 10 MG tablet Commonly known as: CLARITIN Take 10 mg by mouth daily as needed for allergies.   metoprolol tartrate 25 MG tablet Commonly known as: LOPRESSOR Take 1 tablet (25 mg total) by mouth 2 (two) times daily.   omeprazole 20 MG capsule Commonly known as: PRILOSEC TAKE 1 CAPSULE BY MOUTH  DAILY   ONE TOUCH ULTRA 2 w/Device Kit Use as directed E11.9   polyethylene glycol 17 g packet Commonly known as: MIRALAX / GLYCOLAX Take 17 g by mouth daily as needed for moderate constipation.   pravastatin 40 MG tablet Commonly known as: PRAVACHOL TAKE 1 TABLET BY MOUTH  DAILY   Vitamin D (Ergocalciferol) 1.25 MG (50000 UNIT) Caps capsule Commonly known as: DRISDOL Take 1 capsule (50,000 Units total) by mouth every 7 (seven) days.            Durable Medical Equipment  (From admission, onward)         Start     Ordered   02/20/20 0733  For home use only DME 3 n 1  Once        02/20/20 6073           Discharge Care Instructions  (From admission, onward)         Start     Ordered   02/23/20 0000  Discharge wound care:       Comments: As above    02/23/20 1034         Allergies  Allergen Reactions  . Ciprofloxacin Other (See Comments)    All over weakness    Follow-up Information    Leonie Man, MD Follow up.   Specialty: Cardiology Why: Hospital follow-up scheduled for 03/16/2020 at 9:00am. Please arrive 15 minutes early for check-in. If this date/time does not work for you, please call our office to reschedule.  Contact information: [address] [place] [address] [phone]        [facility] [place] [facility].   Why: We will call with appointment details Contact information: [place] [address] [phone]                The  results of significant diagnostics from this hospitalization (including imaging, microbiology, ancillary and laboratory) are listed below for reference.    Significant Diagnostic Studies: CT Head Wo Contrast  Result Date: 02/15/2020 CLINICAL DATA:  Mental status change EXAM: CT HEAD WITHOUT CONTRAST TECHNIQUE: Contiguous axial images were obtained from the base of the skull through the vertex without intravenous contrast. COMPARISON:  None. FINDINGS: Brain: No hemorrhage or intracranial mass is visualized. Small focus of encephalomalacia in the left occipital lobe consistent with chronic infarct. Moderate atrophy. Moderate hypodensity in the white matter likely chronic small vessel ischemic change. Prominent ventricles felt secondary to atrophy. Vascular: No hyperdense vessels.  Carotid vascular calcification Skull: Normal. Negative for fracture or focal lesion. Sinuses/Orbits: Mild mucosal thickening in the ethmoid sinuses Other: None IMPRESSION: 1. No CT evidence for acute intracranial abnormality. 2. Atrophy and small vessel ischemic changes of the white matter. Chronic left occipital lobe infarct. Electronically Signed   By: Donavan Foil M.D.   On: 02/15/2020 16:09   US RENAL  Result Date: 02/15/2020 CLINICAL DATA:  Acute kidney injury. EXAM: RENAL /  URINARY TRACT ULTRASOUND COMPLETE COMPARISON:  Abdominal CT 05/27/2018. Remote abdominal MRI 01/27/2011 FINDINGS: Right Kidney: Renal measurements: 12.9 x 6.8 x 5.0 cm = volume: 228 mL. Increased renal parenchymal echogenicity. No hydronephrosis. Cyst in the mid kidney measures 2.9 x 2.3 x 1.9 cm. Additional cysts on prior CT are not well characterized. No evidence of solid lesion. Previous stones on CT are not well seen by ultrasound. Left Kidney: Renal measurements: 12.8 x 6.5 x 4.8 cm = volume: 208 mL. Increased renal parenchymal echogenicity. No hydronephrosis. 2 cyst in the left kidney, exophytic 8.2 x 7.4 x 7.7 cm cyst arises from the upper pole. Cyst in the lower kidney measures 3.5 x 3 x 3.1 cm. Additional cysts on CT are not well resolved by ultrasound. Previous stones on CT are not seen by ultrasound. Bladder: Appears normal for degree of bladder distention. Other: None. IMPRESSION: 1. Increased renal parenchymal echogenicity consistent with chronic medical renal disease. No obstructive uropathy. 2. Bilateral renal cysts. Many of the cysts on prior CT are not well-defined by ultrasound. 3. Renal stones on prior exam are not demonstrated. Electronically Signed   By: Keith Rake M.D.   On: 02/15/2020 21:39   NM Myocar Multi W/Spect W/Wall Motion / EF  Result Date: 02/22/2020  Defect 1: There is a medium defect of mild severity present in the basal inferior, mid inferior and apical inferior location.  Findings consistent with ischemia.  The left ventricular ejection fraction is normal (55-65%).  Nuclear stress EF: 60%.  Medium size, mild perfusion defect with stress in the inferior wall, that has complete reversibility to normal perfusion at rest. EF appears preserved with grossly normal wall motion. Study is low-intermediate risk.   DG CHEST PORT 1 VIEW  Result Date: 02/18/2020 CLINICAL DATA:  Hypoxemia EXAM: PORTABLE CHEST 1 VIEW COMPARISON:  Radiograph 02/16/2020, CT 01/10/2011 FINDINGS:  Dual lumen left IJ approach central venous catheter tip terminates in the lower SVC beyond the brachiocephalic-caval confluence. Additional support devices overlie the chest. Some atelectatic changes in the lungs which are otherwise clear. Stable cardiomediastinal contours with a calcified aorta. No acute osseous or soft tissue abnormality. Degenerative changes are present in the imaged spine and shoulders. IMPRESSION: Stable appearance of the left IJ approach central venous catheter. Mild atelectasis, otherwise no acute cardiopulmonary process. Electronically Signed   By: Lovena Le M.D.   On: 02/18/2020 19:18  DG Chest Port 1 View  Result Date: 02/16/2020 CLINICAL DATA:  Respiratory failure. EXAM: PORTABLE CHEST 1 VIEW COMPARISON:  02/15/2020.  09/08/2014. FINDINGS: Left IJ line stable position. Heart size stable. Left base atelectasis/scarring again noted. Mild left base infiltrate cannot be excluded. Stable elevation left hemidiaphragm. No pleural effusion or pneumothorax. And thoracic spine. IMPRESSION: 1.  Left IJ line stable position. 2. Left base atelectasis/scarring again noted. Mild left base infiltrate cannot be excluded. Stable elevation left hemidiaphragm. Electronically Signed   By: Marcello Moores  Register   On: 02/16/2020 05:54   DG CHEST PORT 1 VIEW  Result Date: 02/15/2020 CLINICAL DATA:  Follow-up central line placement EXAM: PORTABLE CHEST 1 VIEW COMPARISON:  02/15/2020 FINDINGS: Cardiac shadow is enlarged but stable. Left jugular central line is noted in the mid superior vena cava. No pneumothorax is noted. No infiltrate or effusion is noted. IMPRESSION: No pneumothorax following central line placement. Electronically Signed   By: Inez Catalina M.D.   On: 02/15/2020 21:06   DG Chest Portable 1 View  Result Date: 02/15/2020 CLINICAL DATA:  Generalized weakness. EXAM: PORTABLE CHEST 1 VIEW COMPARISON:  02/25/2018 FINDINGS: Heart size mildly enlarged. Vascularity normal. Elevated left  hemidiaphragm with mild left lower lobe atelectasis. Negative for pneumonia or effusion. IMPRESSION: Mild left lower lobe atelectasis.  Negative for heart failure. Electronically Signed   By: Franchot Gallo M.D.   On: 02/15/2020 15:14   DG Bone Survey Met  Result Date: 02/19/2020 CLINICAL DATA:  Multiple myeloma.  Weakness.  Altered mental status. EXAM: METASTATIC BONE SURVEY COMPARISON:  Head CT 02/15/2020. FINDINGS: There are 2 small lucent lesions in the distal femoral shaft and 2 small lucent lesions in the mid tibial shaft. Left knee arthroplasty. These lesions are typical of ghost tracks from prior fixator pins. There is no other radiographic evidence of focal bone lesion. Heterogeneous appearance in the lower lumbar spine that is nonspecific and likely accentuated by multilevel degenerative change. Diffuse cervical, thoracic, and lumbar degenerative change. No evidence of compression fracture. Aortic atherosclerosis. IMPRESSION: 1. Two small lucent lesions in the distal femoral shaft and 2 small lucent lesions in the mid tibial shaft. Suspect this is related to prior external fixator pin placement, recommend correlation with clinical history. Left knee arthroplasty. 2. There is otherwise no radiographic evidence of focal bone lesion. Electronically Signed   By: Keith Rake M.D.   On: 02/19/2020 15:34   CT BONE MARROW BIOPSY & ASPIRATION  Result Date: 02/23/2020 INDICATION: Concern for multiple myeloma. Please perform CT-guided bone marrow biopsy for tissue diagnostic purposes. EXAM: CT-GUIDED BONE MARROW BIOPSY AND ASPIRATION MEDICATIONS: None ANESTHESIA/SEDATION: Fentanyl 100 mcg IV; Versed 2 mg IV Sedation Time: 12 Minutes; The patient was continuously monitored during the procedure by the interventional radiology nurse under my direct supervision. COMPLICATIONS: None immediate. PROCEDURE: Informed consent was obtained from the patient following an explanation of the procedure, risks, benefits  and alternatives. The patient understands, agrees and consents for the procedure. All questions were addressed. A time out was performed prior to the initiation of the procedure. The patient was positioned prone and non-contrast localization CT was performed of the pelvis to demonstrate the iliac marrow spaces. The operative site was prepped and draped in the usual sterile fashion. Under sterile conditions and local anesthesia, a 22 gauge spinal needle was utilized for procedural planning. Next, an 11 gauge coaxial bone biopsy needle was advanced into the left iliac marrow space. Needle position was confirmed with CT imaging. Initially, a bone  marrow aspiration was performed. Next, a bone marrow biopsy was obtained with the 11 gauge outer bone marrow device. Samples were prepared with the cytotechnologist and deemed adequate. The needle was removed and superficial hemostasis was obtained with manual compression. A dressing was applied. The patient tolerated the procedure well without immediate post procedural complication. IMPRESSION: Successful CT guided left iliac bone marrow aspiration and core biopsy. Electronically Signed   By: Sandi Mariscal M.D.   On: 02/23/2020 11:39   ECHOCARDIOGRAM COMPLETE  Result Date: 02/17/2020    ECHOCARDIOGRAM REPORT   Patient Name:   JANSEN SCIUTO Date of Exam: 02/17/2020 Medical Rec #:  [id number]           Height:       73.0 in Accession #:    [id number]          Weight:       270.1 lb Date of Birth:  May 05, 1946           BSA:          2.445 m Patient Age:    10 years            BP:           119/55 mmHg Patient Gender: M                   HR:           72 bpm. Exam Location:  Inpatient Procedure: 2D Echo, Cardiac Doppler and Color Doppler Indications:    R94.31 Abnormal EKG  History:        Patient has prior history of Echocardiogram examinations, most                 recent 02/25/2018. COPD, Arrythmias:Atrial Fibrillation; Risk                 Factors:Hypertension, Diabetes  and Dyslipidemia. Cancer. Alcohol                 use.  Sonographer:    Tiffany Dance Referring Phys: [id number] BELKYS A REGALADO IMPRESSIONS  1. Left ventricular ejection fraction, by estimation, is 60 to 65%. The left ventricle has normal function. The left ventricle has no regional wall motion abnormalities. There is mild concentric left ventricular hypertrophy. Left ventricular diastolic parameters are indeterminate due to underlying atrial fibrillation.  2. Right ventricular systolic function is normal. The right ventricular size is normal. There is normal pulmonary artery systolic pressure. The estimated right ventricular systolic pressure is 62.1 mmHg.  3. Left atrial size was mildly dilated.  4. The mitral valve is normal in structure. Trivial mitral valve regurgitation. No evidence of mitral stenosis.  5. The aortic valve is tricuspid. Aortic valve regurgitation is not visualized. Mild aortic valve sclerosis is present, with no evidence of aortic valve stenosis.  6. Aortic dilatation noted. There is mild dilatation of the aortic root and of the ascending aorta measuring 40 mm and 59m respectively.  7. The inferior vena cava is normal in size with greater than 50% respiratory variability, suggesting right atrial pressure of 3 mmHg. FINDINGS  Left Ventricle: Left ventricular ejection fraction, by estimation, is 60 to 65%. The left ventricle has normal function. The left ventricle has no regional wall motion abnormalities. The left ventricular internal cavity size was normal in size. There is  mild concentric left ventricular hypertrophy. Left ventricular diastolic parameters are indeterminate. Normal left ventricular filling pressure. Right Ventricle: The right ventricular size is normal. No increase  in right ventricular wall thickness. Right ventricular systolic function is normal. There is normal pulmonary artery systolic pressure. The tricuspid regurgitant velocity is 2.55 m/s, and  with an assumed right  atrial pressure of 3 mmHg, the estimated right ventricular systolic pressure is 92.4 mmHg. Left Atrium: Left atrial size was mildly dilated. Right Atrium: Right atrial size was normal in size. Pericardium: There is no evidence of pericardial effusion. Mitral Valve: The mitral valve is normal in structure. Normal mobility of the mitral valve leaflets. Trivial mitral valve regurgitation. No evidence of mitral valve stenosis. Tricuspid Valve: The tricuspid valve is normal in structure. Tricuspid valve regurgitation is trivial. No evidence of tricuspid stenosis. Aortic Valve: The aortic valve is tricuspid. Aortic valve regurgitation is not visualized. Mild aortic valve sclerosis is present, with no evidence of aortic valve stenosis. Pulmonic Valve: The pulmonic valve was normal in structure. Pulmonic valve regurgitation is not visualized. No evidence of pulmonic stenosis. Aorta: Aortic dilatation noted. There is mild dilatation of the aortic root and of the ascending aorta measuring 40 mm. Venous: The inferior vena cava is normal in size with greater than 50% respiratory variability, suggesting right atrial pressure of 3 mmHg. IAS/Shunts: No atrial level shunt detected by color flow Doppler.  LEFT VENTRICLE PLAX 2D LVIDd:         4.80 cm LVIDs:         3.20 cm LV PW:         1.30 cm LV IVS:        1.20 cm LVOT diam:     2.30 cm LV SV:         71 LV SV Index:   29 LVOT Area:     4.15 cm  RIGHT VENTRICLE          IVC RV Basal diam:  2.50 cm  IVC diam: 1.80 cm TAPSE (M-mode): 1.2 cm LEFT ATRIUM              Index       RIGHT ATRIUM           Index LA diam:        4.90 cm  2.00 cm/m  RA Area:     18.60 cm LA Vol (A2C):   119.0 ml 48.68 ml/m RA Volume:   48.20 ml  19.72 ml/m LA Vol (A4C):   75.2 ml  30.76 ml/m LA Biplane Vol: 95.8 ml  39.19 ml/m  AORTIC VALVE LVOT Vmax:   112.50 cm/s LVOT Vmean:  73.000 cm/s LVOT VTI:    0.171 m  AORTA Ao Root diam: 4.00 cm Ao Asc diam:  3.80 cm MITRAL VALVE               TRICUSPID  VALVE MV Area (PHT): 3.38 cm    TR Peak grad:   26.0 mmHg MV Decel Time: 225 msec    TR Vmax:        255.00 cm/s MV E velocity: 65.20 cm/s                            SHUNTS                            Systemic VTI:  0.17 m                            Systemic Diam:  2.30 cm Fransico Him MD Electronically signed by Fransico Him MD Signature Date/Time: 02/17/2020/3:11:41 PM    Final     Microbiology: Recent Results (from the past 240 hour(s))  SARS Coronavirus 2 by RT PCR     Status: None   Collection Time: 02/15/20  4:09 PM  Result Value Ref Range Status   SARS Coronavirus 2 NEGATIVE NEGATIVE Final    Comment: (NOTE) Result indicates the ABSENCE of SARS-CoV-2 RNA in the patient specimen.   The lowest concentration of SARS-CoV-2 viral copies this assay can detect in nasopharyngeal swab specimens is 500 copies / mL.  A negative result does not preclude SARS-CoV-2 infection and should not be used as the sole basis for patient management decisions. A negative result may occur with improper specimen collection / handling, submission of a specimen other than nasopharyngeal swab, presence of viral mutation(s) within the areas targeted by this assay, and inadequate number of viral copies (<500 copies / mL) present.  Negative results must be combined with clinical observations, patient history, and epidemiological information.   The expected result is NEGATIVE.   Patient Fact Sheet:  BlogSelections.co.uk    Provider Fact Sheet:  https://lucas.com/    This test is not yet approved or cleared by the [place] FDA and  has been [place] orized for detection and/or diagnosis of SARS-CoV-2 by FDA under an Emergency Use Authorization (EUA).  This EUA will remain in effect (meaning this test can be used) for the duration of  the COVID-19 declaration under Section 564(b)(1) of the Act, 21 U.S.C. section 360bbb-3(b)(1), unless the authorization  is terminated or revoked sooner  Performed at [facility], [place] [address] [place], [place] [address]   MRSA PCR Screening     Status: None   Collection Time: 02/16/20  4:45 AM   Specimen: Nasopharyngeal  Result Value Ref Range Status   MRSA by PCR NEGATIVE NEGATIVE Final    Comment:        The GeneXpert MRSA Assay (FDA approved for NASAL specimens only), is one component of a comprehensive MRSA colonization surveillance program. It is not intended to diagnose MRSA infection nor to guide or monitor treatment for MRSA infections. Performed at [facility], [place] [address], [place], [place] [address]      Labs: Basic Metabolic Panel: Recent Labs  Lab 02/17/20 0421 02/17/20 1241 02/18/20 1730 02/18/20 1730 02/19/20 0406 02/20/20 0645 02/21/20 0410 02/22/20 0607 02/23/20 0336  NA 138   < > 138   < > 138 138 138 137 136  K 4.2   < > 3.9   < > 3.7 3.6 4.0 3.8 3.6  CL 103   < > 105   < > 105 105 103 104 104  CO2 26   < > 25   < > '25 25 24 22 23  ' GLUCOSE 98   < > 142*   < > 101* 114* 99 99 96  BUN 52*   < > 53*   < > 48* 42* 38* 34* 31*  CREATININE 4.20*   < > 3.86*   < > 3.35* 2.86* 2.75* 2.53* 2.33*  CALCIUM 8.4*   < > 8.7*   < > 8.7* 8.5* 8.7* 8.8* 8.6*  MG 1.8  --  2.0  --   --   --  1.7  --   --   PHOS 3.3   < >  --   --  3.1 2.6 3.3 3.1 2.9   < > =  values in this interval not displayed.   Liver Function Tests: Recent Labs  Lab 02/19/20 0406 02/20/20 0645 02/21/20 0410 02/22/20 0607 02/23/20 0336  AST  --   --  22  --   --   ALT  --   --  13  --   --   ALKPHOS  --   --  63  --   --   BILITOT  --   --  0.7  --   --   PROT  --   --  6.8  --   --   ALBUMIN 2.6* 2.6* 2.7* 2.8* 2.6*   No results for input(s): LIPASE, AMYLASE in the last 168 hours. No results for input(s): AMMONIA in the last 168 hours. CBC: Recent Labs  Lab 02/17/20 0421 02/20/20 0645 02/22/20 0828 02/23/20 0336  WBC 4.7 4.6 4.1 4.7  NEUTROABS  --   --  2.4  --   HGB  9.8* 9.8* 10.6* 9.5*  HCT 29.8* 32.1* 34.9* 30.5*  MCV 84.4 88.9 91.1 89.2  PLT PLATELET CLUMPS NOTED ON SMEAR, COUNT APPEARS DECREASED 127* 137* 141*   Cardiac Enzymes: No results for input(s): CKTOTAL, CKMB, CKMBINDEX, TROPONINI in the last 168 hours. BNP: BNP (last 3 results) No results for input(s): BNP in the last 8760 hours.  ProBNP (last 3 results) No results for input(s): PROBNP in the last 8760 hours.  CBG: Recent Labs  Lab 02/22/20 1601 02/22/20 2042 02/22/20 2209 02/23/20 0656 02/23/20 1157  GLUCAP 103* 93 86 89 74   Signed:  Domenic Polite MD.  [facility] 02/23/2020, 3:46 PM

## 2020-02-23 NOTE — Progress Notes (Signed)
[place] for Apixaban Indication: atrial fibrillation  Allergies  Allergen Reactions  . Ciprofloxacin Other (See Comments)    All over weakness    Patient Measurements: Height: _0  (185.4 cm) Weight: (!) 108.7 kg (239 lb 10.2 oz) IBW/kg (Calculated) : 79.9 Heparin Dosing Weight: 106.7 kg  Vital Signs: Temp: 98.5 F (36.9 C) (08/05 0942) Temp Source: Oral (08/05 0942) BP: 115/56 (08/05 0942) Pulse Rate: 66 (08/05 0925)  Labs: Recent Labs    02/21/20 0410 02/22/20 0607 02/22/20 0828 02/23/20 0336  HGB  --   --  10.6* 9.5*  HCT  --   --  34.9* 30.5*  PLT  --   --  137* 141*  CREATININE 2.75* 2.53*  --  2.33*    Estimated Creatinine Clearance: 36.5 mL/min (A) (by C-G formula based on SCr of 2.33 mg/dL (H)).   Medical History: Past Medical History:  Diagnosis Date  . Adenoma 05/2008  . Alcoholism in recovery ([place])   . BPH (benign prostatic hyperplasia)   . Cancer [facility])    Prostate  . Chronic LBP    Hip & Back -- Sees Dr. Maia Petties @ [place]  . Colon polyps   . Complex renal cyst 12/2010  . Controlled type 2 diabetes mellitus with neuropathy ([place]) 01/2007  . COPD (chronic obstructive pulmonary disease) ([place])   . Diabetes ([place])   . Diverticulitis of colon   . ED (erectile dysfunction)    s/p Penile prosthesis (09/2010)  . History of prostate cancer    Dr. Karsten Ro  . History of sick sinus syndrome    reduced BB dose for Bradycardia  . HLD (hyperlipidemia)   . Hypertension   . Impaired glucose tolerance 12/21/2013  . Nephrolithiasis   . Osteoarthritis of both hips   . Osteoarthritis of lumbosacral spine    with Disc Disease  . PAF (paroxysmal atrial fibrillation) ([facility])    No longer on Amiodarone.  Not on Anticoaguation b/c no recurrence.  . Paresthesias/numbness    Bilateral LE  . Peripheral neuropathy 12/21/2013    Medications:  Scheduled:  . apixaban  5 mg Oral BID  . capsicum   Topical BID  .  fentaNYL      . insulin aspart  0-5 Units Subcutaneous QHS  . insulin aspart  0-6 Units Subcutaneous TID WC  . lidocaine      . lidocaine-EPINEPHrine      . metoprolol tartrate  25 mg Oral BID  . midazolam      . multivitamin  1 tablet Oral QHS  . pravastatin  40 mg Oral Daily  . sodium chloride flush  10-40 mL Intracatheter Q12H    Assessment: Patient is a 89 yom that presented to the ED on 7/28 with c/o weakness and AMS. The patient was on xarelto for afib prior to admission.  During this admission his antcoagulation therapy was switched to Eliquis (apixaban) due to his decline in renal function.  Eliquis was held 8/2 , as requested by hematology  in preparation for a bone marrow biopsy by IR. Last apixaban dose given 8/1 AM.  Pt is s/p bone marrow bx. Stable on apixaban. Age<80, wt>60kg, scr>1.5   Goal of Therapy:  Monitor platelets by anticoagulation protocol: Yes   Plan:  Cont apixaban 5 mg po bid Rx will monitor peripherally  Onnie Boer, PharmD, BCIDP, AAHIVP, CPP Infectious Disease Pharmacist 02/23/2020 11:29 AM

## 2020-02-23 NOTE — Progress Notes (Signed)
DISCHARGE NOTE HOME  [name] to be discharged Home per MD order. Discussed prescriptions and follow up appointments with the patient. Prescriptions given to patient; medication list explained in detail. Patient verbalized understanding.  Skin clean, dry and intact without evidence of skin break down, no evidence of skin tears noted. IV catheter discontinued intact. Site without signs and symptoms of complications. Dressing and pressure applied. Pt denies pain at the site currently. No complaints noted.  Patient free of lines, drains, and wounds.   An After Visit Summary (AVS) was printed and given to the patient. Patient escorted via wheelchair, and discharged home via private auto.  [name], RN

## 2020-02-23 NOTE — Procedures (Signed)
Pre-procedure Diagnosis: Concern for multiple myeloma Post-procedure Diagnosis: Same  Technically successful CT guided bone marrow aspiration and biopsy of left iliac crest.   Complications: None Immediate  EBL: None  Signed: Sandi Mariscal Pager: [phone] 02/23/2020, 9:00 AM

## 2020-02-23 NOTE — Progress Notes (Signed)
Physical Therapy Treatment Patient Details Name: [name] MRN: [id number] DOB: 1945-10-18 Today's Date: 02/23/2020    History of Present Illness Pt is a 74 y/o male admitted secondary to confusion. Pt found to have a severe AKI on CKD stage IIIb, critical ill on presentation. Pt required HD during admission. PMH including but not limited to DM, HTN, CHF and CKD.    PT Comments    Pt planning for d/c home today with family support; however, he was very upset upon arrival. Stating that he was frustrated with his daughter not bringing his clothes to go home in, and upset that he feels everyone is talking to him like he is a child. Pt finally willing to at least perform a sit<>stand transfer from EOB but would not participate further. Pt would continue to benefit from skilled physical therapy services at this time while admitted and after d/c to address the below listed limitations in order to improve overall safety and independence with functional mobility.    Follow Up Recommendations  Home health PT;Supervision/Assistance - 24 hour     Equipment Recommendations  None recommended by PT    Recommendations for Other Services       Precautions / Restrictions Precautions Precautions: Fall Restrictions Weight Bearing Restrictions: No    Mobility  Bed Mobility               General bed mobility comments: pt seated at EOB upon arrival  Transfers Overall transfer level: Needs assistance Equipment used: Rolling walker (2 wheeled) Transfers: Sit to/from Stand Sit to Stand: Min guard         General transfer comment: min guard for safety; pt unwilling to progress further with mobility this session, very upset about his daughter not bringing his clothes as he has d/c orders  Ambulation/Gait             General Gait Details: pt unwilling to progress further with mobility this session, very upset about his daughter not bringing his clothes as he has d/c  orders   Stairs             Wheelchair Mobility    Modified Rankin (Stroke Patients Only)       Balance Overall balance assessment: Needs assistance Sitting-balance support: Feet supported Sitting balance-Leahy Scale: Fair     Standing balance support: Bilateral upper extremity supported Standing balance-Leahy Scale: Poor                              Cognition Arousal/Alertness: Awake/alert Behavior During Therapy: WFL for tasks assessed/performed Overall Cognitive Status: Impaired/Different from baseline Area of Impairment: Safety/judgement                         Safety/Judgement: Decreased awareness of safety;Decreased awareness of deficits            Exercises      General Comments        Pertinent Vitals/Pain Pain Assessment: No/denies pain    Home Living                      Prior Function            PT Goals (current goals can now be found in the care plan section) Acute Rehab PT Goals PT Goal Formulation: With patient/family Time For Goal Achievement: 03/03/20 Potential to Achieve Goals: Good Progress towards PT goals: Progressing toward  goals    Frequency    Min 3X/week      PT Plan Current plan remains appropriate    Co-evaluation              AM-PAC PT "6 Clicks" Mobility   Outcome Measure  Help needed turning from your back to your side while in a flat bed without using bedrails?: None Help needed moving from lying on your back to sitting on the side of a flat bed without using bedrails?: None Help needed moving to and from a bed to a chair (including a wheelchair)?: A Little Help needed standing up from a chair using your arms (e.g., wheelchair or bedside chair)?: None Help needed to walk in hospital room?: A Little Help needed climbing 3-5 steps with a railing? : A Lot 6 Click Score: 20    End of Session   Activity Tolerance: Patient tolerated treatment well Patient left: in  bed;with call bell/phone within reach Nurse Communication: Mobility status PT Visit Diagnosis: Other abnormalities of gait and mobility (R26.89);Muscle weakness (generalized) (M62.81)     Time: 9470-9628 PT Time Calculation (min) (ACUTE ONLY): 14 min  Charges:  $Therapeutic Activity: 8-22 mins                     Anastasio Champion, DPT  Acute Rehabilitation Services Pager [phone] Office [place] 02/23/2020, 1:14 PM

## 2020-02-23 NOTE — Progress Notes (Signed)
Brief oncology note:  The patient is in the process of discharging to home at the time my visit.  He underwent bone marrow biopsy earlier today and tolerated the procedure well.  Discussed with the patient that we should have some preliminary results by next week.  I have sent a scheduling message to arrange for outpatient follow-up at the cancer center in the middle to the end of next week.  We will call him with the date and time.  Mikey Bussing, DNP, AGPCNP-BC, AOCNP Mon/Tues/Thurs/Fri 7am-5pm; Off Wednesdays Cell: [phone]

## 2020-02-23 NOTE — Progress Notes (Signed)
DISCHARGE NOTE HOME [name] to be discharged Home per MD order. Discussed prescriptions and follow up appointments with the patient. Prescriptions given to patient; medication list explained in detail. Patient verbalized understanding. Patient also given diet education on low sodium and carb modified.  Skin clean, dry and intact without evidence of skin break down, no evidence of skin tears noted. IV catheter discontinued intact. Site without signs and symptoms of complications. Dressing and pressure applied. Pt denies pain at the site currently. No complaints noted.  Patient free of lines, drains, and wounds.   An After Visit Summary (AVS) was printed and given to the patient. Patient escorted via wheelchair, and discharged home via private auto.  Dorthey Sawyer, RN    02/23/20 1321  AVS Discharge Documentation  AVS Discharge Instructions Including Medications Provided to patient/caregiver  Name of Person Receiving AVS Discharge Instructions Including Medications [name]  Name of Clinician That Reviewed AVS Discharge Instructions Including Medications Mady Gemma, RN

## 2020-02-23 NOTE — Consult Note (Signed)
   [facility] CM Inpatient Consult   02/23/2020  [name] 12-17-1945 [id number]   We have reviewed your referral request for  [facility] [[facility]] Patient: [name]   Patient was seen for post hospital follow up needs.  Spoke with the patient at the bedside. HIPAA verified. Patient had declined referral for Remote Health previously however patient was explained by this writer to have follow up assistance due to medication and care need changes for transition of care.  Patient agreed states, "I guess it would be a good idea for someone to look at what's going on, I given in, do what's best to help. My daughter will be here before I leave."   Spoke with inpatient Transition of Care RNCM regarding patient's acceptance for service request.  Primary Care Provider is listed as Cathlean Cower, MD with [facility] and endorsed by patient, this provider is listed to provide the transition of care [TOC] for post hospital follow up.  Plan:  Follow up with inpatient [facility] team done to referral for Remote Health.  Will have follow up for ongoing care and disease management.  For questions contact:   Natividad Brood, RN BSN [facility] Liaison  [phone] business mobile phone Toll free office [phone]  Fax number: [phone] [email or web address]

## 2020-02-24 ENCOUNTER — Telehealth: Payer: Self-pay | Admitting: *Deleted

## 2020-02-24 NOTE — Telephone Encounter (Signed)
Transition Care Management Follow-up Telephone Call   Date discharged? 02/23/20   How have you been since you were released from the hospital? Pt states he is doing alright could be better   Do you understand why you were in the hospital? YES   Do you understand the discharge instructions? YES   Where were you discharged to? Home   Items Reviewed:  Medications reviewed: YES, pt states he was told to stop taking a few medications until he see MD. Martin Majestic over medication that is held until he see provider on 8/10.   Allergies reviewed: YES, he states no changes on his allergies  Dietary changes reviewed YES. Carb modified, heart healthy   Referrals reviewed: YES, will see cardiology 8/27 and new oncologist 03/13/20   Functional Questionnaire:   Activities of Daily Living (ADLs):   He states he are independent in the following: ambulation, feeding, continence, grooming, toileting and dressing States he require assistance with the following: bathing and hygiene   Any transportation issues/concerns?: NO   Any patient concerns? NO   Confirmed importance and date/time of follow-up visits scheduled YES, appt 02/28/20  Provider Appointment booked with Dr. Jenny Reichmann  Confirmed with patient if condition begins to worsen call PCP or go to the ER.  Patient was given the office number and encouraged to call back with question or concerns.  : YES

## 2020-02-27 NOTE — Progress Notes (Addendum)
[name]   [phone] [phone]   Clinic Follow up Note   Patient Care Team: Biagio Borg, MD as PCP - General Ellyn Hack Leonie Green, MD as PCP - Cardiology (Cardiology) Thana Ates, RN as [facility] Management  Date of Service:  03/01/2020  CHIEF COMPLAINT: discuss bone marrow biopsy result   CURRENT THERAPY:  PENDING  INTERVAL HISTORY:  [name] is here for a follow up of MM. He presents to the clinic for the first time as outpatient with his daughter. He notes his BM biopsy went better than he thought it would be. He denies any pain or issues at biopsy site. He notes he is doing better since hospital discharge. He is trying to regain strength in his legs. He notes his insurance would not cover PT at home. He was offered nursing service. He is not interested in another inpatient care and walker if he can avoid it. He notes he is now walking to bed and chair and bathroom. He uses cane. He denies recent fall. He lives with his daughter and his grandchildren help out. He feels he can take care of himself.  He notes his chronic back pain is mildly worse since being told to stop Advil. He notes Tylenol is less effective for him. He notes his appetite has much improved. He saw his PCP earlier this week. He did have significant wight loss since January. He notes he was to lose that weight anyway because he was overweight. He notes his BM are regular. He notes he has had several procedures in the past 6 months, than he has his whole life. He is scared about further procedures until this truly impacts his life.     REVIEW OF SYSTEMS:   Constitutional: Denies fevers, chills or abnormal weight loss Eyes: Denies blurriness of vision Ears, nose, mouth, throat, and face: Denies mucositis or sore throat Respiratory: Denies cough, dyspnea or wheezes Cardiovascular: Denies palpitation, chest discomfort or lower extremity  swelling Gastrointestinal:  Denies nausea, heartburn or change in bowel habits Skin: Denies abnormal skin rashes MKS: (+) Chronic back pain Lymphatics: Denies new lymphadenopathy or easy bruising Neurological:Denies numbness, tingling or new weaknesses (+) LE weakness  Behavioral/Psych: Mood is stable, no new changes  All other systems were reviewed with the patient and are negative.  MEDICAL HISTORY:  Past Medical History:  Diagnosis Date  . Adenoma 05/2008  . Alcoholism in recovery ([place])   . BPH (benign prostatic hyperplasia)   . Cancer [facility])    Prostate  . Chronic LBP    Hip & Back -- Sees Dr. Maia Petties @ [place]  . Colon polyps   . Complex renal cyst 12/2010  . Controlled type 2 diabetes mellitus with neuropathy ([place]) 01/2007  . COPD (chronic obstructive pulmonary disease) ([place])   . Diabetes ([place])   . Diverticulitis of colon   . ED (erectile dysfunction)    s/p Penile prosthesis (09/2010)  . History of prostate cancer    Dr. Karsten Ro  . History of sick sinus syndrome    reduced BB dose for Bradycardia  . HLD (hyperlipidemia)   . Hypertension   . Impaired glucose tolerance 12/21/2013  . Nephrolithiasis   . Osteoarthritis of both hips   . Osteoarthritis of lumbosacral spine    with Disc Disease  . PAF (paroxysmal atrial fibrillation) ([facility])    No longer on Amiodarone.  Not on Anticoaguation b/c no recurrence.  . Paresthesias/numbness  Bilateral LE  . Peripheral neuropathy 12/21/2013    SURGICAL HISTORY: Past Surgical History:  Procedure Laterality Date  . CARDIAC CATHETERIZATION  2003   Normal Coronary Arteries.  Marland Kitchen NM MYOVIEW LTD  03/2016   No Ischemia or Infarct - Visual EF ~60% (computer EF ~39%) - by Echo 55-60%  . PENILE PROSTHESIS IMPLANT  06/21/2012   Procedure: PENILE PROTHESIS INFLATABLE;  Surgeon: Claybon Jabs, MD;  Location: [facility];  Service: Urology;  Laterality: N/A;  REMOVAL AND REPLACEMENT OF SOME  OF PROSTHESIS (AMS)    . PENILE PROSTHESIS PLACEMENT  09/2010  . PROSTATECTOMY    . REMOVAL OF PENILE PROSTHESIS N/A 02/15/2018   Procedure: REMOVAL OF PENILE PROSTHESIS;  Surgeon: Kathie Rhodes, MD;  Location: [facility];  Service: Urology;  Laterality: N/A;  . TRANSTHORACIC ECHOCARDIOGRAM  04/2016   a) Relatively normal EF of 55-60%. No RWMA suggesting no prior MI. Gr 2 DD (pseudo-normal filling pattern) along with moderately dilated left atrium.;; b) 02/2018: Mod LVH. EF 65-70%. No RWMA.  Unable to assess DF 2/2 Afib. Mild MR. Severe BiAtrial Enlargement. High CVP.    I have reviewed the social history and family history with the patient and they are unchanged from previous note.  ALLERGIES:  is allergic to ciprofloxacin.  MEDICATIONS:  Current Outpatient Medications  Medication Sig Dispense Refill  . acetaminophen (TYLENOL) 325 MG tablet Take 2 tablets (650 mg total) by mouth every 6 (six) hours as needed for mild pain or moderate pain.    . Albuterol Sulfate 108 (90 Base) MCG/ACT AEPB Inhale 2 puffs into the lungs every 6 (six) hours as needed. (Patient taking differently: Inhale 2 puffs into the lungs every 6 (six) hours as needed (shortness of breath). ) 1 each 5  . apixaban (ELIQUIS) 5 MG TABS tablet Take 1 tablet (5 mg total) by mouth 2 (two) times daily. 60 tablet 0  . Blood Glucose Monitoring Suppl (ONETOUCH VERIO) w/Device KIT Use as directed once daily E11.9 1 kit 0  . glucose blood (ONETOUCH VERIO) test strip Use as instructed once daily E11.9 100 each 12  . loratadine (CLARITIN) 10 MG tablet Take 10 mg by mouth daily as needed for allergies.    . metoprolol tartrate (LOPRESSOR) 25 MG tablet Take 1 tablet (25 mg total) by mouth 2 (two) times daily. 60 tablet 0  . omeprazole (PRILOSEC) 20 MG capsule TAKE 1 CAPSULE BY MOUTH  DAILY (Patient taking differently: Take 20 mg by mouth daily. ) 90 capsule 3  . polyethylene glycol (MIRALAX / GLYCOLAX) 17 g packet Take 17 g by mouth daily as needed for moderate  constipation. 7 each 0  . pravastatin (PRAVACHOL) 40 MG tablet TAKE 1 TABLET BY MOUTH  DAILY (Patient taking differently: Take 40 mg by mouth daily. ) 90 tablet 3   No current facility-administered medications for this visit.    PHYSICAL EXAMINATION: ECOG PERFORMANCE STATUS: 3 - Symptomatic, >50% confined to bed  Vitals:   03/01/20 1024  BP: (!) 152/96  Pulse: 80  Resp: 18  Temp: 97.8 F (36.6 C)  SpO2: 100%   Filed Weights   03/01/20 1024  Weight: 239 lb 11.2 oz (108.7 kg)    Due to COVID19 we will limit examination to appearance. Patient had no complaints.  GENERAL:alert, no distress and comfortable SKIN: skin color normal, no rashes or significant lesions EYES: normal, Conjunctiva are pink and non-injected, sclera clear  NEURO: alert & oriented x 3 with fluent speech  LABORATORY DATA:  I have reviewed the data as listed CBC Latest Ref Rng & Units 03/01/2020 02/23/2020 02/22/2020  WBC 4.0 - 10.5 K/uL 4.6 4.7 4.1  Hemoglobin 13.0 - 17.0 g/dL 9.9(L) 9.5(L) 10.6(L)  Hematocrit 39 - 52 % 32.2(L) 30.5(L) 34.9(L)  Platelets 150 - 400 K/uL 194 141(L) 137(L)     CMP Latest Ref Rng & Units 03/01/2020 02/23/2020 02/22/2020  Glucose 70 - 99 mg/dL 92 96 99  BUN 8 - 23 mg/dL 29(H) 31(H) 34(H)  Creatinine 0.61 - 1.24 mg/dL 2.38(H) 2.33(H) 2.53(H)  Sodium 135 - 145 mmol/L 137 136 137  Potassium 3.5 - 5.1 mmol/L 3.9 3.6 3.8  Chloride 98 - 111 mmol/L 110 104 104  CO2 22 - 32 mmol/L 19(L) 23 22  Calcium 8.9 - 10.3 mg/dL 9.2 8.6(L) 8.8(L)  Total Protein 6.5 - 8.1 g/dL 7.3 - -  Total Bilirubin 0.3 - 1.2 mg/dL 0.3 - -  Alkaline Phos 38 - 126 U/L 93 - -  AST 15 - 41 U/L 18 - -  ALT 0 - 44 U/L 13 - -    Bone Survey 02/19/20  IMPRESSION: 1. Two small lucent lesions in the distal femoral shaft and 2 small lucent lesions in the mid tibial shaft. Suspect this is related to prior external fixator pin placement, recommend correlation with clinical history. Left knee arthroplasty. 2. There is  otherwise no radiographic evidence of focal bone lesion.    DIAGNOSIS: 02/23/20 BONE MARROW, ASPIRATE, CLOT, CORE:  - Cellular marrow involved by plasma cell neoplasm  - See microscopic description and comment below   PERIPHERAL BLOOD:  - Normocytic anemia and thrombocytopenia  - See complete blood cell count  -plasma cells: Plasma cells are increased (12% manual  differential count)  COMMENT:  Overall, the findings are consistent with a plasma cell neoplasm.  Clinical correlation, including imaging studies, is recommended to  better characterize this neoplasm. FISH (plasma cell myeloma panel) may  be of interest.     RADIOGRAPHIC STUDIES: I have personally reviewed the radiological images as listed and agreed with the findings in the report. No results found.   ASSESSMENT & PLAN:  [name] is a 74 y.o. male with    1.  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  -I discussed his bone marrow biopsy from 02/23/20 which showed 12% plasma cell neoplasm concerning for Multiple Myeloma or Smoldering Myeloma. He does have significant renal failure required temporary dialysis, anemia, no hypercalcemia or definitive lytic lesions on bone survey.  Although he does have diabetes, hypertension, which can cause renal failure and anemia. But he currently meets the criteria for multiple myeloma based on recent lab and bone marrow biopsy results.  It is certainly possible he has smoldering multiple myeloma, and his acute renal failure is related to other etiology.   -I recommend PET scan to further evaluate bone lesions.  --We reviewed the natural course of multiple myeloma, treatment options, etc.  -Further lab work up on 02/19/20 showed IgG 2610, IgA, IgM and LDH normal.  Beta-2-Microglobulin 6.4 -I discussed is bone survey from 02/19/20 showed small lesions related to prior knee surgery.  -staging and risk stratification will be determined based on the cytogenetics and FISH results -Labs reviewed today, Hg 9.9, BUN 29, Cr 2.38, albumin 3. Overall stable  -  F/u in 2 weeks after PET   2. AKI on CKD  -NO known history of kidney disease. His baseline creatinine was around 1.3, and went up with to 1.86 on 08/26/2019 and 2.17 on 10/06/2019.  -He has had AKI requiring temporary hemodialysis, off now  -Cr slightly improved to 2.38 (03/01/20)    3. Mild anemia -Hg has remained stable lately in 9-10 range.    4.  Comorbidities: Hypertension, DM, Atrial Fibrillation, Chronic back pain  -On Eliquis  -His back pain has increased off Advil.   5. LE weakness, social support  -He still has leg weakness and fatigue since hospital discharge.  -His insurance will not cover PT coming to his home. He is currently able to ambulate with cane or walker, but not far. I encouraged him to ambulate around home safely. I will look into PT options.  -He currently lives with daughter and has help from his grandchildren.  -He can take care of himself mostly.    6.  History of prostate cancer status post prostatectomy in 2007   PLAN:  -PET scan in 1-2 weeks  -Lab and f/u in 2 weeks   No problem-specific Assessment & Plan notes found for this encounter.   Orders Placed This Encounter  Procedures  . NM PET Image Initial (PI) Whole Body    Standing Status:   Future    Standing Expiration Date:   03/01/2021    Order Specific Question:   If indicated for the ordered procedure, I authorize the administration of a radiopharmaceutical per Radiology protocol    Answer:   Yes    Order Specific Question:   Preferred imaging location?    Answer:   [place]    Order Specific Question:   Radiology Contrast Protocol - do NOT remove file path    Answer:    [email or web address]   All questions were answered. The patient knows to call the clinic with any problems, questions or concerns. No barriers to learning was detected. The total time spent in the appointment was 40 minutes.      , MD 03/01/2020   I, Amoya Bennett, am acting as scribe for  , MD.   I have reviewed the above documentation for accuracy and completeness, and I agree with the above.       

## 2020-02-28 ENCOUNTER — Encounter: Payer: Self-pay | Admitting: Internal Medicine

## 2020-02-28 ENCOUNTER — Ambulatory Visit: Payer: Medicare Other

## 2020-02-28 ENCOUNTER — Other Ambulatory Visit: Payer: Self-pay

## 2020-02-28 ENCOUNTER — Ambulatory Visit (INDEPENDENT_AMBULATORY_CARE_PROVIDER_SITE_OTHER): Payer: Medicare Other | Admitting: Internal Medicine

## 2020-02-28 VITALS — BP 140/90 | HR 91 | Temp 98.3°F | Ht 73.0 in

## 2020-02-28 DIAGNOSIS — N183 Chronic kidney disease, stage 3 unspecified: Secondary | ICD-10-CM

## 2020-02-28 DIAGNOSIS — E119 Type 2 diabetes mellitus without complications: Secondary | ICD-10-CM | POA: Diagnosis not present

## 2020-02-28 DIAGNOSIS — R531 Weakness: Secondary | ICD-10-CM

## 2020-02-28 DIAGNOSIS — M129 Arthropathy, unspecified: Secondary | ICD-10-CM

## 2020-02-28 DIAGNOSIS — I110 Hypertensive heart disease with heart failure: Secondary | ICD-10-CM

## 2020-02-28 DIAGNOSIS — C9000 Multiple myeloma not having achieved remission: Secondary | ICD-10-CM

## 2020-02-28 DIAGNOSIS — I5032 Chronic diastolic (congestive) heart failure: Secondary | ICD-10-CM

## 2020-02-28 MED ORDER — ONETOUCH VERIO VI STRP: ORAL_STRIP | 12 refills | Status: DC

## 2020-02-28 MED ORDER — ONETOUCH VERIO W/DEVICE KIT: PACK | 0 refills | Status: DC

## 2020-02-28 NOTE — Patient Instructions (Signed)
Ok to stop the omeprazole  Please take OTC Vitamin D3 at 2000 units per day, indefinitely  You are prescribed the new glucometer and strips today, but have them call us with one that is covered with your insurance if this one is not covered  Please check your blood sugars three times daily (before meals) for 1 wk and let us know the average sugar overall; hopefully it will not be over about 100 - 140  If the sugars are oveall higher, we may need to consider adding something like tradjenta for sugar  Please continue all other medications as before, and refills have been done if requested.  Please have the pharmacy call with any other refills you may need.  Please continue your efforts at being more active, low cholesterol diabetic diet, and weight control.  Please keep your appointments with your specialists as you may have planned  Please make an Appointment to return in 6 weeks

## 2020-02-28 NOTE — Progress Notes (Signed)
Subjective:    Patient ID: [name], male    DOB: Dec 10, 1945, 74 y.o.   MRN: [id number]  HPI  Here to f/u post hospn July 28 - aug 5 with AKI on CKD, hyperK, severe TME, and finding of abnormal SPEP with M spike/possible MM, in the setting chronic afib, and small decubitus sacral region stage 1.  Pt presented with AKI with metabolic acidosis and hyperK requiring temporary dialysis, with plan to monitor renal function.  Nephrology consulted and will follow post d/c for need for diuretics.  Also to f/u oncology for abnormal SPEP and f/u bone marrow biopsy, as well as ongoing cardiology f/u with Dr Ellyn Hack aug 27.  Today, pt c/o persistent generalized weakness, declines PT for now but may consider further.  Pt denies chest pain, increased sob or doe, wheezing, orthopnea, PND, increased LE swelling, palpitations, dizziness or syncope.  Pt denies new neurological symptoms such as new headache, or facial or extremity weakness or numbness   Pt denies polydipsia, polyuria. Has lost significant wt recently.of note is pt states Denies worsening reflux, abd pain, dysphagia, n/v, bowel change or blood.  Would like to try off PPI Wt Readings from Last 3 Encounters:  02/19/20 (!) 239 lb 10.2 oz (108.7 kg)  10/06/19 279 lb (126.6 kg)  08/17/19 281 lb (127.5 kg)   Past Medical History:  Diagnosis Date  . Adenoma 05/2008  . Alcoholism in recovery ([place])   . BPH (benign prostatic hyperplasia)   . Cancer [facility])    Prostate  . Chronic LBP    Hip & Back -- Sees Dr. Maia Petties @ [place]  . Colon polyps   . Complex renal cyst 12/2010  . Controlled type 2 diabetes mellitus with neuropathy ([place]) 01/2007  . COPD (chronic obstructive pulmonary disease) ([place])   . Diabetes ([place])   . Diverticulitis of colon   . ED (erectile dysfunction)    s/p Penile prosthesis (09/2010)  . History of prostate cancer    Dr. Karsten Ro  . History of sick sinus syndrome    reduced BB dose for Bradycardia  . HLD  (hyperlipidemia)   . Hypertension   . Impaired glucose tolerance 12/21/2013  . Nephrolithiasis   . Osteoarthritis of both hips   . Osteoarthritis of lumbosacral spine    with Disc Disease  . PAF (paroxysmal atrial fibrillation) ([facility])    No longer on Amiodarone.  Not on Anticoaguation b/c no recurrence.  . Paresthesias/numbness    Bilateral LE  . Peripheral neuropathy 12/21/2013   Past Surgical History:  Procedure Laterality Date  . CARDIAC CATHETERIZATION  2003   Normal Coronary Arteries.  Marland Kitchen NM MYOVIEW LTD  03/2016   No Ischemia or Infarct - Visual EF ~60% (computer EF ~39%) - by Echo 55-60%  . PENILE PROSTHESIS IMPLANT  06/21/2012   Procedure: PENILE PROTHESIS INFLATABLE;  Surgeon: Claybon Jabs, MD;  [facility];  Service: Urology;  Laterality: N/A;  REMOVAL AND REPLACEMENT OF SOME  OF PROSTHESIS (AMS)   . PENILE PROSTHESIS PLACEMENT  09/2010  . PROSTATECTOMY    . REMOVAL OF PENILE PROSTHESIS N/A 02/15/2018   Procedure: REMOVAL OF PENILE PROSTHESIS;  Surgeon: Kathie Rhodes, MD;  [facility];  Service: Urology;  Laterality: N/A;  . TRANSTHORACIC ECHOCARDIOGRAM  04/2016   a) Relatively normal EF of 55-60%. No RWMA suggesting no prior MI. Gr 2 DD (pseudo-normal filling pattern) along with moderately dilated left atrium.;; b) 02/2018: Mod LVH. EF 65-70%.  No RWMA.  Unable to assess DF 2/2 Afib. Mild MR. Severe BiAtrial Enlargement. High CVP.    reports that he quit smoking about 37 years ago. His smoking use included cigarettes. He has never used smokeless tobacco. He reports current alcohol use. He reports that he does not use drugs. family history includes Coronary artery disease in his father and mother; Diabetes in his father; Heart attack in his mother; Prostate cancer in his father. Allergies  Allergen Reactions  . Ciprofloxacin Other (See Comments)    All over weakness   Current Outpatient Medications on File Prior to Visit  Medication Sig Dispense  Refill  . acetaminophen (TYLENOL) 325 MG tablet Take 2 tablets (650 mg total) by mouth every 6 (six) hours as needed for mild pain or moderate pain.    . Albuterol Sulfate 108 (90 Base) MCG/ACT AEPB Inhale 2 puffs into the lungs every 6 (six) hours as needed. (Patient taking differently: Inhale 2 puffs into the lungs every 6 (six) hours as needed (shortness of breath). ) 1 each 5  . apixaban (ELIQUIS) 5 MG TABS tablet Take 1 tablet (5 mg total) by mouth 2 (two) times daily. 60 tablet 0  . loratadine (CLARITIN) 10 MG tablet Take 10 mg by mouth daily as needed for allergies.    . metoprolol tartrate (LOPRESSOR) 25 MG tablet Take 1 tablet (25 mg total) by mouth 2 (two) times daily. 60 tablet 0  . omeprazole (PRILOSEC) 20 MG capsule TAKE 1 CAPSULE BY MOUTH  DAILY (Patient taking differently: Take 20 mg by mouth daily. ) 90 capsule 3  . polyethylene glycol (MIRALAX / GLYCOLAX) 17 g packet Take 17 g by mouth daily as needed for moderate constipation. 7 each 0  . pravastatin (PRAVACHOL) 40 MG tablet TAKE 1 TABLET BY MOUTH  DAILY (Patient taking differently: Take 40 mg by mouth daily. ) 90 tablet 3   No current facility-administered medications on file prior to visit.   Transitional Care Management elements noted today: 1)  Date of D/C: as above 2)  Medication reconciliation:  done today at end visit 3)  Review of D/C summary or other information:  done today 4)  Review of need for f/u on pending diagnostic tests and treatments:  done today 5)  Review of need for Interaction with other providers who will assume or resume care of pt specific problems: done today 6)  Education of patient/family/guardian or caregiver: done today 7)  Establishment or Re-establishment of referrals and arranging for needed community resources:  done today 8)  Assistance in scheduling any required follow up with community providers and services:  done today Review of Systems All otherwise neg per pt    Objective:    Physical Exam BP 140/90 (BP Location: Left Arm, Patient Position: Sitting, Cuff Size: Large)   Pulse 91   Temp 98.3 F (36.8 C) (Oral)   Ht '6\' 1"'  (1.854 m)   SpO2 98%   BMI 31.62 kg/m  VS noted, lost wt visibly, general weak Constitutional: Pt appears in NAD HENT: Head: NCAT.  Right Ear: External ear normal.  Left Ear: External ear normal.  Eyes: . Pupils are equal, round, and reactive to light. Conjunctivae and EOM are normal Nose: without d/c or deformity Neck: Neck supple. Gross normal ROM Cardiovascular: Normal rate and regular rhythm.   Pulmonary/Chest: Effort normal and breath sounds without rales or wheezing.  Abd:  Soft, NT, ND, + BS, no organomegaly Neurological: Pt is alert. At baseline orientation, motor  grossly intact Skin: Skin is warm. No rashes, other new lesions, no LE edema Psychiatric: Pt behavior is normal without agitation  All otherwise neg per pt Lab Results  Component Value Date   WBC 4.6 03/01/2020   HGB 9.9 (L) 03/01/2020   HCT 32.2 (L) 03/01/2020   PLT 194 03/01/2020   GLUCOSE 92 03/01/2020   CHOL 123 10/06/2019   TRIG 69.0 10/06/2019   HDL 28.70 (L) 10/06/2019   LDLCALC 80 10/06/2019   ALT 13 03/01/2020   AST 18 03/01/2020   NA 137 03/01/2020   K 3.9 03/01/2020   CL 110 03/01/2020   CREATININE 2.38 (H) 03/01/2020   BUN 29 (H) 03/01/2020   CO2 19 (L) 03/01/2020   TSH 1.07 10/06/2019   PSA 0.00 (L) 10/06/2019   INR 1.63 (H) 08/26/2010   HGBA1C 6.2 (H) 02/15/2020   MICROALBUR 1.8 10/06/2019      Assessment & Plan:

## 2020-02-29 ENCOUNTER — Other Ambulatory Visit: Payer: Self-pay

## 2020-02-29 NOTE — Patient Outreach (Signed)
[facility]) Care Management  02/29/2020  Jerett Grill 1946/01/28 [id number]   Placed call to patient and explained reason for call.  MD office does TOC.  Patient reports he was in the hospital due to weakness from over use of diuretics.  Reports he is feeling better now. Reports that he knows how to take his medication and is taking them as prescribed.  Reports he eats when he is hungry and normally not 3 times per day.  Reports that he has transportation to appointments. Reports that he has a supportive family who assist him as needed.  Reviewed with patient that the MD wanted him to monitor his CBG prior to meal and record readings.  Patient reports that he knows how to self monitor and will go get his CBG machine and start monitoring.  Reports that his only need is for PT.  Reports that he feels like he needs to get stronger .  States that he uses a cane and or walker. Reports he did not talk to MD about this yesterday during his office visit.   Patient denies any other needs at this time.  PLAN: will send an in basket message to primary MD and ask for a PT order. Will follow up with patient after return message from MD.  Will mail a successful outreach letter to patient.    Tomasa Rand, RN, BSN, CEN [facility] [employer] [phone]

## 2020-03-01 ENCOUNTER — Encounter: Payer: Self-pay | Admitting: Hematology

## 2020-03-01 ENCOUNTER — Inpatient Hospital Stay: Payer: Medicare Other | Attending: Hematology | Admitting: Hematology

## 2020-03-01 ENCOUNTER — Telehealth: Payer: Self-pay | Admitting: Hematology

## 2020-03-01 ENCOUNTER — Inpatient Hospital Stay: Payer: Medicare Other

## 2020-03-01 ENCOUNTER — Other Ambulatory Visit: Payer: Self-pay

## 2020-03-01 VITALS — BP 152/96 | HR 80 | Temp 97.8°F | Resp 18 | Ht 73.0 in | Wt 239.7 lb

## 2020-03-01 DIAGNOSIS — G8929 Other chronic pain: Secondary | ICD-10-CM | POA: Diagnosis not present

## 2020-03-01 DIAGNOSIS — D649 Anemia, unspecified: Secondary | ICD-10-CM | POA: Diagnosis not present

## 2020-03-01 DIAGNOSIS — M545 Low back pain: Secondary | ICD-10-CM | POA: Diagnosis not present

## 2020-03-01 DIAGNOSIS — C9000 Multiple myeloma not having achieved remission: Secondary | ICD-10-CM

## 2020-03-01 DIAGNOSIS — Z7901 Long term (current) use of anticoagulants: Secondary | ICD-10-CM | POA: Insufficient documentation

## 2020-03-01 DIAGNOSIS — I4891 Unspecified atrial fibrillation: Secondary | ICD-10-CM | POA: Insufficient documentation

## 2020-03-01 DIAGNOSIS — N179 Acute kidney failure, unspecified: Secondary | ICD-10-CM | POA: Insufficient documentation

## 2020-03-01 DIAGNOSIS — Z8546 Personal history of malignant neoplasm of prostate: Secondary | ICD-10-CM | POA: Insufficient documentation

## 2020-03-01 LAB — CMP (CANCER CENTER ONLY)
ALT: 13 U/L (ref 0–44)
AST: 18 U/L (ref 15–41)
Albumin: 3 g/dL — ABNORMAL LOW (ref 3.5–5.0)
Alkaline Phosphatase: 93 U/L (ref 38–126)
Anion gap: 8 (ref 5–15)
BUN: 29 mg/dL — ABNORMAL HIGH (ref 8–23)
CO2: 19 mmol/L — ABNORMAL LOW (ref 22–32)
Calcium: 9.2 mg/dL (ref 8.9–10.3)
Chloride: 110 mmol/L (ref 98–111)
Creatinine: 2.38 mg/dL — ABNORMAL HIGH (ref 0.61–1.24)
GFR, Est AFR Am: 30 mL/min — ABNORMAL LOW (ref >60)
GFR, Estimated: 26 mL/min — ABNORMAL LOW (ref >60)
Glucose, Bld: 92 mg/dL (ref 70–99)
Potassium: 3.9 mmol/L (ref 3.5–5.1)
Sodium: 137 mmol/L (ref 135–145)
Total Bilirubin: 0.3 mg/dL (ref 0.3–1.2)
Total Protein: 7.3 g/dL (ref 6.5–8.1)

## 2020-03-01 LAB — CBC WITH DIFFERENTIAL (CANCER CENTER ONLY)
Abs Immature Granulocytes: 0.01 10*3/uL (ref 0.00–0.07)
Basophils Absolute: 0 10*3/uL (ref 0.0–0.1)
Basophils Relative: 0 %
Eosinophils Absolute: 0.1 10*3/uL (ref 0.0–0.5)
Eosinophils Relative: 2 %
HCT: 32.2 % — ABNORMAL LOW (ref 39.0–52.0)
Hemoglobin: 9.9 g/dL — ABNORMAL LOW (ref 13.0–17.0)
Immature Granulocytes: 0 %
Lymphocytes Relative: 26 %
Lymphs Abs: 1.2 10*3/uL (ref 0.7–4.0)
MCH: 27.7 pg (ref 26.0–34.0)
MCHC: 30.7 g/dL (ref 30.0–36.0)
MCV: 90.2 fL (ref 80.0–100.0)
Monocytes Absolute: 0.5 10*3/uL (ref 0.1–1.0)
Monocytes Relative: 10 %
Neutro Abs: 2.8 10*3/uL (ref 1.7–7.7)
Neutrophils Relative %: 62 %
Platelet Count: 194 10*3/uL (ref 150–400)
RBC: 3.57 MIL/uL — ABNORMAL LOW (ref 4.22–5.81)
RDW: 14.3 % (ref 11.5–15.5)
WBC Count: 4.6 10*3/uL (ref 4.0–10.5)
nRBC: 0 % (ref 0.0–0.2)

## 2020-03-01 NOTE — Telephone Encounter (Signed)
Scheduled per 8/12 los. Printed avs and calendar for pt.

## 2020-03-01 NOTE — Assessment & Plan Note (Signed)
[place] for PT referral

## 2020-03-02 ENCOUNTER — Encounter (HOSPITAL_BASED_OUTPATIENT_CLINIC_OR_DEPARTMENT_OTHER): Payer: Medicare Other | Admitting: Internal Medicine

## 2020-03-02 ENCOUNTER — Other Ambulatory Visit: Payer: Self-pay

## 2020-03-02 NOTE — Patient Outreach (Addendum)
[facility]) Care Management  03/02/2020  Erven Rudell 08/18/1945 [id number]   Care Coordination:  In basket message received back from Dr. Cathlean Cower who states he will order PT.  Placed call back to patient to inform.  Patient happy with outcome.    PLAN: close case as no other needs identified.  Will mail outreach letter to patient. If he has needs in the future he will call me.Tomasa Rand, RN, BSN, CEN [place] Coordinator [phone]

## 2020-03-04 ENCOUNTER — Other Ambulatory Visit: Payer: Self-pay | Admitting: Internal Medicine

## 2020-03-04 ENCOUNTER — Encounter: Payer: Self-pay | Admitting: Internal Medicine

## 2020-03-04 NOTE — Assessment & Plan Note (Signed)
stable overall by history and exam, recent data reviewed with pt, and pt to continue medical treatment as before,  to f/u any worsening symptoms or concerns  

## 2020-03-04 NOTE — Assessment & Plan Note (Signed)
stable overall by history and exam, recent data reviewed with pt, and pt to continue medical treatment as before,  to f/u any worsening symptoms or concerns, f/u cardiology as planned

## 2020-03-04 NOTE — Assessment & Plan Note (Signed)
Pt on PPI for ulcer prophylaxis, no longer on nsaid, ok for off PPI

## 2020-03-04 NOTE — Assessment & Plan Note (Signed)
For renal f/u as planned

## 2020-03-04 NOTE — Assessment & Plan Note (Signed)
Possible, for f/u bone marrow bx with oncology as planned

## 2020-03-05 ENCOUNTER — Encounter (HOSPITAL_COMMUNITY): Payer: Self-pay | Admitting: Hematology

## 2020-03-05 ENCOUNTER — Encounter: Payer: Self-pay | Admitting: Internal Medicine

## 2020-03-05 LAB — SURGICAL PATHOLOGY

## 2020-03-05 MED ORDER — METOPROLOL TARTRATE 50 MG PO TABS
50.0000 mg | ORAL_TABLET | Freq: Two times a day (BID) | ORAL | 3 refills | Status: DC
Start: 2020-03-05 — End: 2020-12-04

## 2020-03-07 ENCOUNTER — Encounter: Payer: Self-pay | Admitting: Physical Therapy

## 2020-03-07 ENCOUNTER — Ambulatory Visit: Payer: Medicare Other | Attending: Internal Medicine | Admitting: Physical Therapy

## 2020-03-07 DIAGNOSIS — R2689 Other abnormalities of gait and mobility: Secondary | ICD-10-CM | POA: Insufficient documentation

## 2020-03-07 DIAGNOSIS — M6281 Muscle weakness (generalized): Secondary | ICD-10-CM

## 2020-03-07 DIAGNOSIS — N390 Urinary tract infection, site not specified: Secondary | ICD-10-CM | POA: Diagnosis not present

## 2020-03-07 DIAGNOSIS — N179 Acute kidney failure, unspecified: Secondary | ICD-10-CM | POA: Diagnosis not present

## 2020-03-07 NOTE — Therapy (Signed)
[place], [place], [address] Phone: [phone]   Fax:  [phone]  Physical Therapy Evaluation  Patient Details  Name: [name] MRN: [id number] Date of Birth: 1946/03/04 Referring Provider (PT): Dr Cathlean Cower    Encounter Date: 03/07/2020   PT End of Session - 03/07/20 1330    Visit Number 1    Number of Visits 16    Date for PT Re-Evaluation 05/02/20    Authorization Type [employer] medic[employer]re    PT Start Time 1150    PT Stop Time 1232    PT Time Calculation (min) 42 min    Activity Tolerance Patient tolerated treatment well    Behavior During Therapy [facility] for tasks assessed/performed           Past Medical History:  Diagnosis Date  . Adenoma 05/2008  . Alcoholism in recovery ([place])   . BPH (benign prostatic hyperplasia)   . Cancer [facility])    Prostate  . Chronic LBP    Hip & Back -- Sees Dr. Maia Petties @ [place]  . Colon polyps   . Complex renal cyst 12/2010  . Controlled type 2 diabetes mellitus with neuropathy ([place]) 01/2007  . COPD (chronic obstructive pulmonary disease) ([place])   . Diabetes ([place])   . Diverticulitis of colon   . ED (erectile dysfunction)    s/p Penile prosthesis (09/2010)  . History of prostate cancer    Dr. Karsten Ro  . History of sick sinus syndrome    reduced BB dose for Bradycardia  . HLD (hyperlipidemia)   . Hypertension   . Impaired glucose tolerance 12/21/2013  . Nephrolithiasis   . Osteoarthritis of both hips   . Osteoarthritis of lumbosacral spine    with Disc Disease  . PAF (paroxysmal atrial fibrillation) ([facility])    No longer on Amiodarone.  Not on Anticoaguation b/c no recurrence.  . Paresthesias/numbness    Bilateral LE  . Peripheral neuropathy 12/21/2013    Past Surgical History:  Procedure Laterality Date  . CARDIAC CATHETERIZATION  2003   Normal Coronary Arteries.  Marland Kitchen NM MYOVIEW LTD  03/2016   No Ischemia or Infarct - Visual EF ~60%  (computer EF ~39%) - by Echo 55-60%  . PENILE PROSTHESIS IMPLANT  06/21/2012   Procedure: PENILE PROTHESIS INFLATABLE;  Surgeon: Claybon Jabs, MD;  Location: [facility];  Service: Urology;  Laterality: N/A;  REMOVAL AND REPLACEMENT OF SOME  OF PROSTHESIS ([facility])   . PENILE PROSTHESIS PLACEMENT  09/2010  . PROSTATECTOMY    . REMOVAL OF PENILE PROSTHESIS N/A 02/15/2018   Procedure: REMOVAL OF PENILE PROSTHESIS;  Surgeon: Kathie Rhodes, MD;  Location: [facility];  Service: Urology;  Laterality: N/A;  . TRANSTHORACIC ECHOCARDIOGRAM  04/2016   a) Relatively normal EF of 55-60%. No RWMA suggesting no prior MI. Gr 2 DD (pseudo-normal filling pattern) along with moderately dilated left atrium.;; b) 02/2018: Mod LVH. EF 65-70%. No RWMA.  Unable to assess DF 2/2 Afib. Mild MR. Severe BiAtrial Enlargement. High CVP.    There were no vitals filed for this visit.    Subjective Assessment - 03/07/20 1155    Subjective Patient has had progressive weakness in his legs for abpout a month. He had kidney failure and was hospitilized for several days. Since hospitilization he has had difficulty walking. he was not using an assitive device prior to hospitilization. Now he has to use a cane or a walker around the house.  He has not had any rehab. He was hospitilizaed on 02/15/2020    Pertinent History Low back pain, kidney failure, caridac cath, PAD,    How long can you stand comfortably? Needs gaurding to stand    Currently in Pain? Yes    Pain Score 5     Pain Location Heel    Pain Orientation Left    Pain Descriptors / Indicators Aching    Pain Type Chronic pain    Pain Onset More than a month ago    Pain Frequency Constant    Aggravating Factors  constant    Pain Relieving Factors nothing    Effect of Pain on Daily Activities has had it for a long time              [facility] PT Assessment - 03/07/20 0001      Assessment   Medical Diagnosis Gebral weakness and ebility     Referring Provider  (PT) Dr Cathlean Cower     Onset Date/Surgical Date --   1 month ago    Hand Dominance Right    Next MD Visit Nothing scheduled     Prior Therapy none       Precautions   Precautions None      Restrictions   Weight Bearing Restrictions No      Balance Screen   Has the patient fallen in the past 6 months No    Has the patient had a decrease in activity level because of a fear of falling?  No    Is the patient reluctant to leave their home because of a fear of falling?  No      Home Environment   Living Environment Private residence    Additional Comments 3 steps into the house. 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 Level Green living life       Cognition   Overall Cognitive Status Within Functional Limits for tasks assessed    Attention Focused    Focused Attention Appears intact    Memory Appears intact    Awareness Appears intact    Problem Solving Appears intact    Executive Function Reasoning      Observation/Other Assessments   Focus on Therapeutic Outcomes (FOTO)  Patient was late to appoitment with a lot of assessment to do       Sensation   Light Touch Appears Intact    Additional Comments No       Coordination   Gross Motor Movements are Fluid and Coordinated Yes    Fine Motor Movements are Fluid and Coordinated Yes      Posture/Postural Control   Posture Comments forward head and rounded shoulders       ROM / Strength   AROM / PROM / Strength AROM;PROM;Strength      Strength   Strength Assessment Site Hip;Knee;Ankle;Hand    Right/Left hand Right;Left    Right Hand Grip (lbs) 25    Left Hand Grip (lbs) 25    Right/Left Hip Right;Left    Right Hip Flexion 4/5    Right Hip ABduction 4/5    Right Hip ADduction 4/5    Left Hip Flexion 4/5    Left Hip ABduction 4/5    Left Hip ADduction 4/5    Right/Left Knee Right;Left  Right Knee Flexion 4/5    Right Knee Extension 4/5    Left Knee Flexion 4/5    Left Knee Extension 4/5      Transfers   Comments Min a for balance with sit to stand transfer       Ambulation/Gait   Gait Comments 5 feet with min a for balance. Slow unsteady steps using the cane. Mod cuing  for direction to get to the bed. Decreased hip flexion ; decreased bilateral strde length      High Level Balance   High Level Balance Comments normal stance CGA; normal stance eyes closed min a; narrow base min a narrow base eyes clsed min a                       Objective measurements completed on examination: See above findings.       [facility] Adult PT Treatment/Exercise - 03/07/20 0001      Exercises   Exercises Knee/Hip      Knee/Hip Exercises: Seated   Long Arc Quad Limitations 2x10     Other Seated Knee/Hip Exercises seated clamshells 2x10     Hamstring Limitations hamstring curl yellow x10                   PT Education - 03/07/20 1329    Education Details reviewed HEP and symptom mangement    Person(s) Educated Patient    Methods Explanation;Demonstration;Tactile cues;Verbal cues    Comprehension Verbalized understanding;Returned demonstration;Verbal cues required;Tactile cues required            PT Short Term Goals - 03/07/20 1356      PT SHORT TERM GOAL #1   Title Patient will tranfer sit to stand with supervision    Time 4    Period Weeks    Status New    Target Date 04/04/20      PT SHORT TERM GOAL #2   Title Patient will ambualte 400' with LRAD    Time 4    Period Weeks    Status New    Target Date 04/04/20      PT SHORT TERM GOAL #3   Title Patient will increase bilateral LE strength to 4+/5 gross    Time 4    Period Weeks    Status New    Target Date 04/04/20             PT Long Term Goals - 03/07/20 1406      PT LONG TERM GOAL #1   Title Patient will ambualte 1000' w/ LRAD in order to go to MD appointments eaily    Time 8     Period Weeks    Status New    Target Date 05/02/20      PT LONG TERM GOAL #2   Title Patient will go up/down 3 steps without assitance in order to get in and out of his house    Time 8    Period Weeks    Status New    Target Date 05/02/20                  Plan - 03/07/20 1332    Clinical Impression Statement Patient is a 74 year old male who presents wtih decreased ability to ambulate following a hospitilization. He presents with general bilateral LE weakness and decreased endurance with fucntional activity. He requires min a to transfer sit to stand and min a for balance with gait.  He would benefit from skilled therapy to improve ability to ambualte and perfrom ADL's    Personal Factors and Comorbidities Comorbidity 1;Comorbidity 2    Comorbidities Low Back pain, COPD, bilateral hip OA    Examination-Activity Limitations Dressing;Transfers;Lift;Caring for Others;Carry;Reach Overhead;Squat;Locomotion Level;Stand;Stairs    Examination-Participation Restrictions Community Activity;Cleaning;Laundry;Meal Prep    Stability/Clinical Decision Making Unstable/Unpredictable   rapiud decline in ability to walk   Clinical Decision Making High    PT Frequency 2x / week    PT Duration 8 weeks    PT Treatment/Interventions ADLs/Self Care Home Management;Moist Heat;Electrical Stimulation;Gait training;Stair training;Functional mobility training;Therapeutic activities;Balance training;Therapeutic exercise;Neuromuscular re-education;Patient/family education;Manual techniques;Passive range of motion;Taping    PT Next Visit Plan 2 min walk test, soit to stand test put goals in for testing if needed. General bilateral UE and LE exercises sit to stand transfers.    PT Home Exercise Plan laq band, hamstring curl band, clamshell band    Consulted and Agree with Plan of Care Patient           Patient will benefit from skilled therapeutic intervention in order to improve the following deficits and  impairments:  Abnormal gait, Decreased activity tolerance, Decreased balance, Decreased endurance, Decreased mobility, Pain, Decreased strength, Decreased coordination, Impaired UE functional use, Improper body mechanics, Difficulty walking, Decreased range of motion  Visit Diagnosis: Other abnormalities of gait and mobility  Muscle weakness (generalized)     Problem List Patient Active Problem List   Diagnosis Date Noted  . Decubitus ulcer of sacral region, stage 1   . NSVT (nonsustained ventricular tachycardia) ([place])   . Palliative care by specialist   . Multiple myeloma ([place])   . Nonsustained ventricular tachycardia ([place])   . Chronic atrial fibrillation ([place])   . Pressure injury of skin 02/16/2020  . AKI (acute kidney injury) ([place]) 02/15/2020  . General weakness   . Diabetic leg ulcer ([place]) 10/06/2019  . CKD (chronic kidney disease) stage 3, GFR 30-59 ml/min 10/06/2019  . Diabetic ulcer of right lower leg ([place]) 02/23/2019  . Abnormal LFTs 04/27/2018  . Open wound of scrotum 03/01/2018  . Acute on chronic diastolic CHF (congestive heart failure) ([place]) 02/25/2018  . Postoperative anemia due to acute blood loss 02/25/2018  . Occipital stroke ([place]), left 09/23/2017  . Diabetes mellitus type II, non insulin dependent ([place])   . Encounter for well adult exam with abnormal findings 07/31/2017  . Swelling of lower extremity 04/04/2016  . Junctional tachycardia ([place]) 03/18/2015  . Exertional dyspnea 07/05/2014  . Obesity (BMI 30-39.9) 05/04/2014  . Benign essential tremor 05/04/2014  . Peripheral neuropathy 12/21/2013  . Nonspecific abnormal finding in stool contents 09/06/2012  . Heme positive stool 08/09/2012  . Difficulty passing stool 08/09/2012  . Sebaceous cyst 04/15/2012  . Complex renal cyst 01/13/2011  . Back pain 01/02/2011  . PAF (paroxysmal atrial fibrillation) - now off amiodarone; asymptomatic. CHA2DS2-VASc Score -5 (on Xarelto) 01/02/2011  . KNEE PAIN, LEFT  08/27/2009  . COLONIC POLYPS, HX OF 08/08/2008  . ERECTILE DYSFUNCTION 09/27/2007  . ULNAR NEUROPATHY 09/27/2007  . VENTRICULAR HYPERTROPHY, LEFT 09/27/2007  . ALLERGIC RHINITIS 09/27/2007  . Arthropathy 09/27/2007  . GANGLION CYST, WRIST, LEFT 09/27/2007  . HEMORRHOIDS, INTERNAL 06/10/2007  . DIVERTICULOSIS, COLON 06/10/2007  . [place] DISEASE, LUMBAR 06/03/2007  . NEPHROLITHIASIS, HX OF 06/03/2007  . Diabetes mellitus with neuropathy ([place]) 02/11/2007  . Hyperlipidemia with target LDL less than 100 02/11/2007  . ANXIETY 02/11/2007  . Nondependent Alcohol Abuse, in Remission 02/11/2007  .  Hypertensive heart disease with chronic diastolic congestive heart failure ([place]) 02/11/2007  . COPD (chronic obstructive pulmonary disease) ([place]) 02/11/2007  . BENIGN PROSTATIC HYPERTROPHY 02/11/2007  . LOW BACK PAIN 02/11/2007  . Disturbance of skin sensation 02/11/2007  . PROSTATE CANCER, HX OF 02/11/2007    Carney Living PT DPT  03/07/2020, 2:44 PM  [facility] [address] [place], [place], [address] Phone: [phone]   Fax:  [phone]  Name: Pervis Macintyre MRN: [id number] Date of Birth: 09/18/1945

## 2020-03-13 DIAGNOSIS — N1831 Chronic kidney disease, stage 3a: Secondary | ICD-10-CM | POA: Diagnosis not present

## 2020-03-13 DIAGNOSIS — E1122 Type 2 diabetes mellitus with diabetic chronic kidney disease: Secondary | ICD-10-CM | POA: Diagnosis not present

## 2020-03-13 DIAGNOSIS — I129 Hypertensive chronic kidney disease with stage 1 through stage 4 chronic kidney disease, or unspecified chronic kidney disease: Secondary | ICD-10-CM | POA: Diagnosis not present

## 2020-03-13 DIAGNOSIS — D649 Anemia, unspecified: Secondary | ICD-10-CM | POA: Diagnosis not present

## 2020-03-13 DIAGNOSIS — N179 Acute kidney failure, unspecified: Secondary | ICD-10-CM | POA: Diagnosis not present

## 2020-03-14 ENCOUNTER — Other Ambulatory Visit: Payer: Self-pay | Admitting: Cardiology

## 2020-03-15 ENCOUNTER — Ambulatory Visit: Payer: Medicare Other | Admitting: Physical Therapy

## 2020-03-16 ENCOUNTER — Ambulatory Visit: Payer: Medicare Other | Admitting: Cardiology

## 2020-03-20 ENCOUNTER — Telehealth: Payer: Self-pay | Admitting: Internal Medicine

## 2020-03-20 NOTE — Telephone Encounter (Signed)
New message:   Pt is needing a new prescription for apixaban (ELIQUIS) 5 MG TABS tablet sent to [place], [place], [address]. Pt was prescribed this medication while in the hospital. Pt's daughter also states that they need a prescription sent for One touch verio flex or One Touch verio reflect sent to [place] ([place]), [place] - [address]. Pt already has the test strips but does not have a meter. Please advise.

## 2020-03-21 ENCOUNTER — Ambulatory Visit: Payer: Medicare Other | Admitting: Physical Therapy

## 2020-03-21 ENCOUNTER — Telehealth: Payer: Self-pay | Admitting: Internal Medicine

## 2020-03-21 MED ORDER — APIXABAN 5 MG PO TABS: 5.0000 mg | ORAL_TABLET | Freq: Two times a day (BID) | ORAL | 3 refills | Status: DC

## 2020-03-21 NOTE — Addendum Note (Signed)
Addended by: Biagio Borg on: 03/21/2020 09:03 PM   Modules accepted: Orders

## 2020-03-21 NOTE — Telephone Encounter (Signed)
New Message:   Pt is calling and states he is needing a refill of the Eliquis sent to [facility] for a short term supply until he can get his 78 day supply from [facility]. Please advise pt is almost out of this medication.

## 2020-03-21 NOTE — Telephone Encounter (Signed)
Done erx 

## 2020-03-21 NOTE — Telephone Encounter (Signed)
error 

## 2020-03-21 NOTE — Telephone Encounter (Signed)
Awaiting Dr. Jenny Reichmann to givethe OK for me to send Eliquis to pharmacy for pt as I have noticed Dr. Jenny Reichmann is not the provider who originally rx'd this med for pt.

## 2020-03-21 NOTE — Telephone Encounter (Signed)
Rx for Eliquis was sent out on 02/24/2020 to [facility].  I will send pt a supply out to [facility] for pt.

## 2020-03-23 ENCOUNTER — Other Ambulatory Visit: Payer: Self-pay

## 2020-03-23 ENCOUNTER — Ambulatory Visit (HOSPITAL_COMMUNITY)
Admission: RE | Admit: 2020-03-23 | Discharge: 2020-03-23 | Disposition: A | Discharge status: Home / Self Care | Payer: Medicare Other | Source: Ambulatory Visit | Attending: Hematology | Admitting: Hematology

## 2020-03-23 DIAGNOSIS — I700 Atherosclerosis of aorta: Secondary | ICD-10-CM | POA: Insufficient documentation

## 2020-03-23 DIAGNOSIS — N62 Hypertrophy of breast: Secondary | ICD-10-CM | POA: Insufficient documentation

## 2020-03-23 DIAGNOSIS — K5730 Diverticulosis of large intestine without perforation or abscess without bleeding: Secondary | ICD-10-CM | POA: Diagnosis not present

## 2020-03-23 DIAGNOSIS — K449 Diaphragmatic hernia without obstruction or gangrene: Secondary | ICD-10-CM | POA: Diagnosis not present

## 2020-03-23 DIAGNOSIS — I517 Cardiomegaly: Secondary | ICD-10-CM | POA: Insufficient documentation

## 2020-03-23 DIAGNOSIS — C9000 Multiple myeloma not having achieved remission: Secondary | ICD-10-CM | POA: Insufficient documentation

## 2020-03-23 LAB — GLUCOSE, CAPILLARY: Glucose-Capillary: 93 mg/dL (ref 70–99)

## 2020-03-23 MED ORDER — APIXABAN 5 MG PO TABS
5.0000 mg | ORAL_TABLET | Freq: Two times a day (BID) | ORAL | 0 refills | Status: DC
Start: 2020-03-23 — End: 2021-03-22

## 2020-03-23 MED ORDER — FLUDEOXYGLUCOSE F - 18 (FDG) INJECTION
12.0000 | Freq: Once | INTRAVENOUS | Status: AC | PRN
  Administered 2020-03-23: 12 via INTRAVENOUS

## 2020-03-23 NOTE — Telephone Encounter (Signed)
   Please send short of Eliquis to [place] ([place]), [place] - [address] Patient will run out  of medication before mail order delivered

## 2020-03-23 NOTE — Telephone Encounter (Signed)
30 day supply sent to pharmacy.  

## 2020-03-27 ENCOUNTER — Ambulatory Visit: Payer: Medicare Other | Admitting: Cardiology

## 2020-03-27 ENCOUNTER — Other Ambulatory Visit: Payer: Self-pay

## 2020-03-27 ENCOUNTER — Encounter: Payer: Self-pay | Admitting: Cardiology

## 2020-03-27 VITALS — BP 163/92 | HR 68 | Ht 73.0 in | Wt 238.6 lb

## 2020-03-27 DIAGNOSIS — I5032 Chronic diastolic (congestive) heart failure: Secondary | ICD-10-CM

## 2020-03-27 DIAGNOSIS — I4729 Other ventricular tachycardia: Secondary | ICD-10-CM

## 2020-03-27 DIAGNOSIS — R0609 Other forms of dyspnea: Secondary | ICD-10-CM

## 2020-03-27 DIAGNOSIS — I4819 Other persistent atrial fibrillation: Secondary | ICD-10-CM

## 2020-03-27 DIAGNOSIS — I110 Hypertensive heart disease with heart failure: Secondary | ICD-10-CM | POA: Diagnosis not present

## 2020-03-27 DIAGNOSIS — M7989 Other specified soft tissue disorders: Secondary | ICD-10-CM | POA: Diagnosis not present

## 2020-03-27 DIAGNOSIS — R0600 Dyspnea, unspecified: Secondary | ICD-10-CM

## 2020-03-27 DIAGNOSIS — I4821 Permanent atrial fibrillation: Secondary | ICD-10-CM

## 2020-03-27 DIAGNOSIS — I472 Ventricular tachycardia: Secondary | ICD-10-CM

## 2020-03-27 DIAGNOSIS — E785 Hyperlipidemia, unspecified: Secondary | ICD-10-CM

## 2020-03-27 MED ORDER — AMLODIPINE BESYLATE 5 MG PO TABS: 5.0000 mg | ORAL_TABLET | Freq: Every day | ORAL | 1 refills | Status: DC

## 2020-03-27 NOTE — Progress Notes (Signed)
Primary Care Provider: Biagio Borg, MD Cardiologist: Glenetta Hew, MD Electrophysiologist: None  Clinic Note: No chief complaint on file.   HPI:    [name] is a 74 y.o. male with a complicated past medical history notable for HTN, chronic lower extreme edema (venous stasis), permanent A. fib (no longer on amiodarone) below who presents today for post hospital follow-up.  [name] was last seen on August 17, 2019 via telemedicine.  He was very scared staying home due to paranoia about COVID-19.  He noted back pain and joint pain.  Was putting on weight due to lack of control--Stocking his freezer with ice cream.  Also no exercise.  He would walk up and down the driveway which is the extent of doing well.  -->  411.  Baseline dyspnea.  Stable edema.  Drinking prune juice for constipation.  Back and joint pain.  Insomnia.  Recent Hospitalizations:  7/28-02/23/2020-> admitted with AKI, severe metabolic encephalopathy noted to have a SPEP M spike concern for multiple myeloma.  Presented with metabolic acidosis with a bicarb of 9 potassium 7.5 and creatinine 11.  Was noted to be encephalopathic.  This was thought to be related to hypovolemia worsened by ACE inhibitor NSAIDs with component of multiple myeloma.  Started treatment with IV bicarb drip, insulin, dextrose and IV calcium. ->  Nephrology consulted-CRRT x 2 treatments with improvement in creatinine down to the 2.5 range.  Converted from Xarelto to Eliquis because of renal failure.  Increased to 25 mg metoprolol twice daily for nonsustained V. tach.  Mild troponin elevation.  Echo stable.  Stress test read as low risk.  NSVT thought to be related to electrolyte abnormalities and renal failure  Follow-up evaluation: => Does not meet criteria for MM or smoldering multiple myeloma-plan for close monitoring.  Initial SPEP showed M protein of 0.5g/dl -> with significant the elevated lambda light chain  BM BX  02/23/2020 -> 12% plasma cell neoplasm concerning for MM versus smoldering myeloma  02/19/2020: IgG 2610, IgA, IgM and LDH normal.  Beta-2 microglobulin 6.4.  PET scan (03/23/2020) negative hypermetabolic bone lesions  August just restarted some medications including increasing metoprolol to 50 mg twice daily.  Lab Results  Component Value Date   CREATININE 1.64 (H) 03/29/2020   BUN 18 03/29/2020   NA 142 03/29/2020   K 4.3 03/29/2020   CL 115 (H) 03/29/2020   CO2 22 03/29/2020    Reviewed  CV studies:    The following studies were reviewed today: (if available, images/films reviewed: From Epic Chart or Care Everywhere) . TTE (02/17/2020): EF 60 to 65%.  No R WMA.  Unable to assess diastolic parameters because of A. fib.  Mildly elevated PA pressures.  Mild LA dilation.  Mild aortic valve sclerosis but no stenosis.  Normal IVC. Marland Kitchen Myoview (02/21/2020): EF 60%.  Medium sized mild severity defect in the basal inferior, mid inferior and apical inferior location-suggesting of ischemia.  Read as low-intermediate risk.  (On cardiology review this is felt to be a fixed defect either related to prior infarct versus diaphragmatic attenuation.  Felt to be low risk)   Interval History:   [name] is here today for hospital follow-up along with his daughter who I am seeing for the first time.  He is in a wheelchair his legs are still too weak to walk around a lot.  He gets around the house okay with a cane or walker.  COPD has been strengthening physical therapy.  He still quite weak then He does say that he had lost about 50 pounds since January but is now up about 9 pounds from his discharge.  He sleeps in a recliner more for comfort. Despite the weight gain, he is not really noticing that much edema compared to his normal baseline.   He says the swelling has up days and down days.  He really denies any symptoms of atrial fibrillation but no irregular heartbeats or palpitations.  He denies any  chest pain or pressure with rest or exertion.  Although he has fatigue, he denies any syncope or near syncope, no TIA or amaurosis fugax.  He is not walking enough to notice any claudication.  The patient does not have symptoms concerning for COVID-19 infection (fever, chills, cough, or new shortness of breath).   REVIEWED OF SYSTEMS   Review of Systems  Constitutional: Positive for malaise/fatigue and weight loss (Initial roughly 50lb weight loss, is now 9 pounds up from dry weight.).  HENT: Negative for congestion and nosebleeds.   Respiratory: Positive for shortness of breath and wheezing. Negative for cough.   Cardiovascular: Positive for leg swelling.  Gastrointestinal: Negative for blood in stool and melena.  Genitourinary: Negative for frequency and hematuria.  Musculoskeletal: Positive for joint pain. Negative for falls (Not up enough to fall).       Either in wheelchair or walking with cane/walker.  Neurological: Positive for dizziness and weakness (Generalized). Negative for focal weakness and headaches (Intermittent).  Psychiatric/Behavioral: Positive for depression (Change in appetite 8, mild anhedonia.  Excess sleep.) and memory loss. The patient is nervous/anxious (Extremely nervous about Covid). The patient does not have insomnia.     I have reviewed and (if needed) personally updated the lytic lesions.  Problem list, medications, allergies, past medical and surgical history, social and family history.   PAST MEDICAL HISTORY   Past Medical History:  Diagnosis Date  . Adenoma 05/2008  . Alcoholism in recovery ([place])   . BPH (benign prostatic hyperplasia)   . Cancer [facility])    Prostate  . Chronic LBP    Hip & Back -- Sees Dr. Maia Petties @ [place]  . Colon polyps   . Complex renal cyst 12/2010  . Controlled type 2 diabetes mellitus with neuropathy ([place]) 01/2007  . COPD (chronic obstructive pulmonary disease) ([place])   . Diabetes ([place])   . Diverticulitis of  colon   . ED (erectile dysfunction)    s/p Penile prosthesis (09/2010)  . History of prostate cancer    Dr. Karsten Ro  . History of sick sinus syndrome    reduced BB dose for Bradycardia  . HLD (hyperlipidemia)   . Hypertension   . Impaired glucose tolerance 12/21/2013  . Nephrolithiasis   . Osteoarthritis of both hips   . Osteoarthritis of lumbosacral spine    with Disc Disease  . PAF (paroxysmal atrial fibrillation) (HCC)    No longer on Amiodarone.  Not on Anticoaguation b/c no recurrence.  . Paresthesias/numbness    Bilateral LE  . Peripheral neuropathy 12/21/2013    PAST SURGICAL HISTORY   Past Surgical History:  Procedure Laterality Date  . CARDIAC CATHETERIZATION  2003   Normal Coronary Arteries.  Marland Kitchen NM MYOVIEW LTD  02/21/2020   EF 60%.  Medium sized mild severity defect in the basal inferior, mid inferior and apical inferior location-suggesting of ischemia.  Read as low-intermediate risk.  (On cardiology review this is felt to be a fixed defect either related  to prior infarct versus diaphragmatic attenuation.  Felt to be low risk)  . PENILE PROSTHESIS IMPLANT  06/21/2012   Procedure: PENILE PROTHESIS INFLATABLE;  Surgeon: Claybon Jabs, MD;  Location: [facility];  Service: Urology;  Laterality: N/A;  REMOVAL AND REPLACEMENT OF SOME  OF PROSTHESIS ([facility])   . PENILE PROSTHESIS PLACEMENT  09/2010  . PROSTATECTOMY    . REMOVAL OF PENILE PROSTHESIS N/A 02/15/2018   Procedure: REMOVAL OF PENILE PROSTHESIS;  Surgeon: Kathie Rhodes, MD;  Location: [facility];  Service: Urology;  Laterality: N/A;  . TRANSTHORACIC ECHOCARDIOGRAM  04/2016   a) Relatively normal EF of 55-60%. No RWMA suggesting no prior MI. Gr 2 DD (pseudo-normal filling pattern) along with moderately dilated left atrium.;; b) 02/2018: Mod LVH. EF 65-70%. No RWMA.  Unable to assess DF 2/2 Afib. Mild MR. Severe BiAtrial Enlargement. High CVP.  Marland Kitchen TRANSTHORACIC ECHOCARDIOGRAM  02/17/2020   EF 60 to 65%.  No R WMA.   Unable to assess diastolic parameters because of A. fib.  Mildly elevated PA pressures.  Mild LA dilation.  Mild aortic valve sclerosis but no stenosis.  Normal IVC.    Immunization History  Administered Date(s) Administered  . H1N1 08/08/2008  . Influenza Split 06/03/2011, 04/15/2012  . Influenza Whole 04/20/2008, 04/20/2009  . Influenza, High Dose Seasonal PF 06/01/2013, 04/27/2018, 05/11/2019  . Influenza,inj,Quad PF,6+ Mos 04/28/2014  . Influenza-Unspecified 05/31/2016, 06/20/2017  . Moderna SARS-COVID-2 Vaccination 10/20/2019, 11/17/2019  . Pneumococcal Conjugate-13 12/21/2013  . Pneumococcal Polysaccharide-23 01/19/2008, 07/29/2017  . Td 08/08/2008  . Zoster 05/31/2014    MEDICATIONS/ALLERGIES   Current Meds  Medication Sig  . acetaminophen (TYLENOL) 325 MG tablet Take 2 tablets (650 mg total) by mouth every 6 (six) hours as needed for mild pain or moderate pain.  Marland Kitchen apixaban (ELIQUIS) 5 MG TABS tablet Take 1 tablet (5 mg total) by mouth 2 (two) times daily.  Marland Kitchen loratadine (CLARITIN) 10 MG tablet Take 10 mg by mouth daily as needed for allergies.  . metoprolol tartrate (LOPRESSOR) 50 MG tablet Take 1 tablet (50 mg total) by mouth 2 (two) times daily.  . polyethylene glycol (MIRALAX / GLYCOLAX) 17 g packet Take 17 g by mouth daily as needed for moderate constipation.  . pravastatin (PRAVACHOL) 40 MG tablet TAKE 1 TABLET BY MOUTH  DAILY (Patient taking differently: Take 40 mg by mouth daily. )    Allergies  Allergen Reactions  . Ciprofloxacin Other (See Comments)    All over weakness    SOCIAL HISTORY/FAMILY HISTORY   Reviewed in Epic:  Pertinent findings:  --> Recovering from prolonged hospital stay.  Now accompanied by his daughter.  Very weak, doing home physical therapy.  Spends a lot of time in his wheelchair otherwise.  Very weak.  OBJCTIVE -PE, EKG, labs   Wt Readings from Last 3 Encounters:  03/29/20 249 lb 4.8 oz (113.1 kg)  03/27/20 238 lb 9.6 oz (108.2 kg)    03/01/20 239 lb 11.2 oz (108.7 kg)    Physical Exam: BP (!) 163/92   Pulse 68   Ht '6\' 1"'  (1.854 m)   Wt 238 lb 9.6 oz (108.2 kg)   SpO2 100%   BMI 31.48 kg/m  Physical Exam Vitals reviewed.  Constitutional:      General: He is not in acute distress.    Appearance: He is obese. He is not toxic-appearing.     Comments: Sitting in wheelchair, chronically ill-appearing.  Well-groomed  HENT:     Head:  Normocephalic and atraumatic.  Neck:     Vascular: Hepatojugular reflux and JVD (8 cmH2O) present. No carotid bruit.  Cardiovascular:     Rate and Rhythm: Normal rate. Rhythm irregularly irregular.     Chest Wall: PMI is not displaced.     Pulses: Decreased pulses (Decreased pedal pulses, difficult to palpate because of thick skin and edema.).     Heart sounds: S1 normal and S2 normal. Heart sounds are distant. Murmur (1/6 harsh C-D midsystolic SEM at RUSB-neck) heard.  No friction rub.  Pulmonary:     Effort: Pulmonary effort is normal. No respiratory distress.     Breath sounds: Wheezing present. No rhonchi.     Comments: Mild diffuse crackles with faint basal rales. Abdominal:     General: Bowel sounds are normal.     Palpations: Abdomen is soft. There is no mass (No HSM).     Comments: Obese  Musculoskeletal:        General: Swelling (1-2+ pitting edema bilateral ankle to shin) present. Normal range of motion.     Cervical back: No rigidity (Somewhat stiff kyphotic).     Comments: Definitely appears to have had muscle atrophy.  Neurological:     General: No focal deficit present.     Mental Status: He is alert and oriented to person, place, and time.  Psychiatric:        Behavior: Behavior normal.        Thought Content: Thought content normal.        Judgment: Judgment normal.     Comments: Seems a little bit down.  Understandably after long hospital stay, feeling completely weak and concerned about diagnosis of MM.     Adult ECG Report  Rate: 68 ;  Rhythm: atrial  fibrillation and LAFB; inversions.  Narrative Interpretation: Stable  Recent Labs:    Lab Results  Component Value Date   CHOL 123 10/06/2019   HDL 28.70 (L) 10/06/2019   LDLCALC 80 10/06/2019   TRIG 69.0 10/06/2019   CHOLHDL 4 10/06/2019   Lab Results  Component Value Date   CREATININE 1.64 (H) 03/29/2020   BUN 18 03/29/2020   NA 142 03/29/2020   K 4.3 03/29/2020   CL 115 (H) 03/29/2020   CO2 22 03/29/2020   Lab Results  Component Value Date   TSH 1.07 10/06/2019    ASSESSMENT/PLAN    Problem List Items Addressed This Visit    Hypertensive heart disease with chronic diastolic congestive heart failure (HCC) (Chronic)    Has been on a relatively decent regiment up until his recent hospitalization, now we need to get him back on his meds.  A bit concerned about his weight gain going up.  Difficult to diurese with fluctuating renal function.  Now his renal indices improved.  For now, I think we need to get his blood pressure back down.  I am reluctant to adjust his diuretic dosing, but defer to his nephrologist.  I do think he probably needs to be back on standing dose of diuretic. Plan: Start low-dose amlodipine and titrate accordingly.--Need to determine follow-up renal function to decide about reinitiation of standard diuretic.      Relevant Orders   EKG 12-Lead (Completed)   Permanent atrial fibrillation ([place]) - now off amiodarone; asymptomatic. CHA2DS2-VASc Score -5 (on Xarelto) - Primary (Chronic)    Remains pretty much permanently in A. fib now.  No longer on amiodarone.  He does not notice if he is or is not in  A. fib, but he clearly is probably exacerbating some of his diastolic heart failure.  I think his most recent issue was related to renal failure more so than heart failure.  He is on 50 mg twice daily metoprolol for rate control along with apixaban for antiarrhythmic.  We talked about potential options if he were to have symptomatic tachycardic A. Fib.   Certainly cardioversion is probably not likely to be a successful option, therefore I think rate control is the only option having stopped amiodarone in the past.  Continue rate control.      Relevant Orders   EKG 12-Lead (Completed)   Exertional dyspnea (Chronic)    Combination of the significant deconditioning with recent hospitalization in conjunction with A. fib and diastolic dysfunction. His weight is also clearly an issue.  Continue to try to titrate hypertension control and reinitiate diuretic.      Swelling of lower extremity (Chronic)    As his weight is now going up, I think we probably need to get him back on a diuretic.  He had previously been on spironolactone and furosemide.  With renal function being an issue, would probably not consider spironolactone, but need to consider restarting a diuretic.        Hyperlipidemia with target LDL less than 100 (Chronic)    LDL was 80 on last check.  Currently on pravastatin, tolerating relatively well.  Labs followed by PCP.      Nonsustained ventricular tachycardia (HCC)    No further episodes.  Suspect this may have been related to electrolyte abnormalities.         COVID-19 Education: The signs and symptoms of COVID-19 were discussed with the patient and how to seek care for testing (follow up with PCP or arrange E-visit).   The importance of social distancing and COVID-19 vaccination was discussed today. 1 min The patient is practicing social distancing & Masking.   I spent a total of 35 minutes with the patient spent in direct patient consultation.  Additional time spent with chart review  / charting (studies, outside notes, etc): 30 ->  Total Time: 75 min   Current medicines are reviewed at length with the patient today.  (+/- concerns) n/a  Notice: This dictation was prepared with Dragon dictation along with smaller phrase technology. Any transcriptional errors that result from this process are unintentional and may  not be corrected upon review.  Patient Instructions / Medication Changes & Studies & Tests Ordered   Patient Instructions  Medication Instructions:  Start taking Amlodipine 5 mg one tablet daily   *If you need a refill on your cardiac medications before your next appointment, please call your pharmacy*   Lab Work: Not needed    Testing/Procedures: Not needed   Follow-Up: At [facility], you and your health needs are our priority.  As part of our continuing mission to provide you with exceptional heart care, we have created designated Provider Care Teams.  These Care Teams include your primary Cardiologist (physician) and Advanced Practice Providers (APPs -  Physician Assistants and Nurse Practitioners) who all work together to provide you with the care you need, when you need it.  We recommend signing up for the patient portal called "MyChart".  Sign up information is provided on this After Visit Summary.  MyChart is used to connect with patients for Virtual Visits (Telemedicine).  Patients are able to view lab/test results, encounter notes, upcoming appointments, etc.  Non-urgent messages can be sent to your provider  as well.   To learn more about what you can do with MyChart, go to [email or web address].    Your next appointment:   3 to 4  month(s)  The format for your next appointment:   In Person  Provider:   Glenetta Hew, MD   Other Instructions     Studies Ordered:   Orders Placed This Encounter  Procedures  . EKG 12-Lead     Glenetta Hew, M.D., M.S. Interventional Cardiologist   Pager # [phone] Phone # [phone] [address] [place], [place] [address]   Thank you for choosing Heartcare at [facility]!!

## 2020-03-27 NOTE — Patient Instructions (Signed)
Medication Instructions:  Start taking Amlodipine 5 mg one tablet daily   *If you need a refill on your cardiac medications before your next appointment, please call your pharmacy*   Lab Work: Not needed    Testing/Procedures: Not needed   Follow-Up: At [facility], you and your health needs are our priority.  As part of our continuing mission to provide you with exceptional heart care, we have created designated Provider Care Teams.  These Care Teams include your primary Cardiologist (physician) and Advanced Practice Providers (APPs -  Physician Assistants and Nurse Practitioners) who all work together to provide you with the care you need, when you need it.  We recommend signing up for the patient portal called "MyChart".  Sign up information is provided on this After Visit Summary.  MyChart is used to connect with patients for Virtual Visits (Telemedicine).  Patients are able to view lab/test results, encounter notes, upcoming appointments, etc.  Non-urgent messages can be sent to your provider as well.   To learn more about what you can do with MyChart, go to [email or web address].    Your next appointment:   3 to 4  month(s)  The format for your next appointment:   In Person  Provider:   Glenetta Hew, MD   Other Instructions

## 2020-03-28 ENCOUNTER — Other Ambulatory Visit: Payer: Self-pay | Admitting: Hematology

## 2020-03-28 DIAGNOSIS — C9000 Multiple myeloma not having achieved remission: Secondary | ICD-10-CM

## 2020-03-28 NOTE — Progress Notes (Addendum)
[place]   [phone] [phone]   Clinic Follow up Note   Patient Care Team: Biagio Borg, MD as PCP - General Ellyn Hack Leonie Green, MD as PCP - Cardiology (Cardiology)  Date of Service:  03/29/2020  CHIEF COMPLAINT: F/u of MM vs SMM  CURRENT THERAPY:  Observation   INTERVAL HISTORY:  [name] is here for a follow up and discuss his recent scan findings. He presents to the clinic with his daughter.  He came in a wheelchair.  Pt is clinically stable.  He still has moderate fatigue, not physically active at home.  He spends most time in recliner. Able to do ADLs, appetite OK. He gained about 9lbs in the past one month, mild bilateral leg edema which is chronic and stable.  He has been off diuretics lately.  He denies any significant new pain, nausea, abdominal discomfort or other new symptoms.  Review of system otherwise negative.   MEDICAL HISTORY:  Past Medical History:  Diagnosis Date  . Adenoma 05/2008  . Alcoholism in recovery ([place])   . BPH (benign prostatic hyperplasia)   . Cancer [facility])    Prostate  . Chronic LBP    Hip & Back -- Sees Dr. Maia Petties @ [place]  . Colon polyps   . Complex renal cyst 12/2010  . Controlled type 2 diabetes mellitus with neuropathy ([place]) 01/2007  . COPD (chronic obstructive pulmonary disease) ([place])   . Diabetes ([place])   . Diverticulitis of colon   . ED (erectile dysfunction)    s/p Penile prosthesis (09/2010)  . History of prostate cancer    Dr. Karsten Ro  . History of sick sinus syndrome    reduced BB dose for Bradycardia  . HLD (hyperlipidemia)   . Hypertension   . Impaired glucose tolerance 12/21/2013  . Nephrolithiasis   . Osteoarthritis of both hips   . Osteoarthritis of lumbosacral spine    with Disc Disease  . PAF (paroxysmal atrial fibrillation) ([facility])    No longer on Amiodarone.  Not on Anticoaguation b/c no recurrence.  . Paresthesias/numbness    Bilateral LE  .  Peripheral neuropathy 12/21/2013    SURGICAL HISTORY: Past Surgical History:  Procedure Laterality Date  . CARDIAC CATHETERIZATION  2003   Normal Coronary Arteries.  Marland Kitchen NM MYOVIEW LTD  03/2016   No Ischemia or Infarct - Visual EF ~60% (computer EF ~39%) - by Echo 55-60%  . PENILE PROSTHESIS IMPLANT  06/21/2012   Procedure: PENILE PROTHESIS INFLATABLE;  Surgeon: Claybon Jabs, MD;  Location: [facility];  Service: Urology;  Laterality: N/A;  REMOVAL AND REPLACEMENT OF SOME  OF PROSTHESIS ([facility])   . PENILE PROSTHESIS PLACEMENT  09/2010  . PROSTATECTOMY    . REMOVAL OF PENILE PROSTHESIS N/A 02/15/2018   Procedure: REMOVAL OF PENILE PROSTHESIS;  Surgeon: Kathie Rhodes, MD;  Location: [facility];  Service: Urology;  Laterality: N/A;  . TRANSTHORACIC ECHOCARDIOGRAM  04/2016   a) Relatively normal EF of 55-60%. No RWMA suggesting no prior MI. Gr 2 DD (pseudo-normal filling pattern) along with moderately dilated left atrium.;; b) 02/2018: Mod LVH. EF 65-70%. No RWMA.  Unable to assess DF 2/2 Afib. Mild MR. Severe BiAtrial Enlargement. High CVP.    I have reviewed the social history and family history with the patient and they are unchanged from previous note.  ALLERGIES:  is allergic to ciprofloxacin.  MEDICATIONS:  Current Outpatient Medications  Medication Sig Dispense Refill  .  acetaminophen (TYLENOL) 325 MG tablet Take 2 tablets (650 mg total) by mouth every 6 (six) hours as needed for mild pain or moderate pain.    Marland Kitchen amLODipine (NORVASC) 5 MG tablet Take 1 tablet (5 mg total) by mouth daily. 90 tablet 1  . apixaban (ELIQUIS) 5 MG TABS tablet Take 1 tablet (5 mg total) by mouth 2 (two) times daily. 60 tablet 0  . loratadine (CLARITIN) 10 MG tablet Take 10 mg by mouth daily as needed for allergies.    . metoprolol tartrate (LOPRESSOR) 50 MG tablet Take 1 tablet (50 mg total) by mouth 2 (two) times daily. 180 tablet 3  . polyethylene glycol (MIRALAX / GLYCOLAX) 17 g packet Take 17 g by  mouth daily as needed for moderate constipation. 7 each 0  . pravastatin (PRAVACHOL) 40 MG tablet TAKE 1 TABLET BY MOUTH  DAILY (Patient taking differently: Take 40 mg by mouth daily. ) 90 tablet 3   No current facility-administered medications for this visit.    PHYSICAL EXAMINATION: ECOG PERFORMANCE STATUS: 3 - Symptomatic, >50% confined to bed  Vitals:   03/29/20 1008  BP: (!) 173/102  Pulse: (!) 50  Resp: 18  Temp: (!) 97.5 F (36.4 C)  SpO2: 100%   Filed Weights   03/29/20 1008  Weight: 249 lb 4.8 oz (113.1 kg)    GENERAL:alert, no distress and comfortable SKIN: skin color, texture, turgor are normal, no rashes or significant lesions EYES: normal, Conjunctiva are pink and non-injected, sclera clear NECK: supple, thyroid normal size, non-tender, without nodularity LYMPH:  no palpable lymphadenopathy in the cervical, axillary  LUNGS: clear to auscultation and percussion with normal breathing effort HEART: regular rate & rhythm and no murmurs and no lower extremity edema ABDOMEN:abdomen soft, non-tender and normal bowel sounds Musculoskeletal:no cyanosis of digits and no clubbing  NEURO: alert & oriented x 3 with fluent speech, no focal motor/sensory deficits  LABORATORY DATA:  I have reviewed the data as listed CBC Latest Ref Rng & Units 03/29/2020 03/01/2020 02/23/2020  WBC 4.0 - 10.5 K/uL 5.2 4.6 4.7  Hemoglobin 13.0 - 17.0 g/dL 11.0(L) 9.9(L) 9.5(L)  Hematocrit 39 - 52 % 35.8(L) 32.2(L) 30.5(L)  Platelets 150 - 400 K/uL 201 194 141(L)     CMP Latest Ref Rng & Units 03/29/2020 03/01/2020 02/23/2020  Glucose 70 - 99 mg/dL 109(H) 92 96  BUN 8 - 23 mg/dL 18 29(H) 31(H)  Creatinine 0.61 - 1.24 mg/dL 1.64(H) 2.38(H) 2.33(H)  Sodium 135 - 145 mmol/L 142 137 136  Potassium 3.5 - 5.1 mmol/L 4.3 3.9 3.6  Chloride 98 - 111 mmol/L 115(H) 110 104  CO2 22 - 32 mmol/L 22 19(L) 23  Calcium 8.9 - 10.3 mg/dL 8.9 9.2 8.6(L)  Total Protein 6.5 - 8.1 g/dL 8.1 7.3 -  Total Bilirubin 0.3 -  1.2 mg/dL 0.4 0.3 -  Alkaline Phos 38 - 126 U/L 120 93 -  AST 15 - 41 U/L 16 18 -  ALT 0 - 44 U/L 8 13 -     Bone Survey 02/19/20  IMPRESSION: 1. Two small lucent lesions in the distal femoral shaft and 2 small lucent lesions in the mid tibial shaft. Suspect this is related to prior external fixator pin placement, recommend correlation with clinical history. Left knee arthroplasty. 2. There is otherwise no radiographic evidence of focal bone lesion.    DIAGNOSIS: 02/23/20 BONE MARROW, ASPIRATE, CLOT, CORE:  - Cellular marrow involved by plasma cell neoplasm  - See microscopic description  and comment below   PERIPHERAL BLOOD:  - Normocytic anemia and thrombocytopenia  - See complete blood cell count  -plasma cells: Plasma cells are increased (12% manual  differential count)  COMMENT:  Overall, the findings are consistent with a plasma cell neoplasm.  Clinical correlation, including imaging studies, is recommended to  better characterize this neoplasm. FISH (plasma cell myeloma panel) may  be of interest.    PET 03/23/20 IMPRESSION: 1. No evidence of metabolically active myeloma. No hypermetabolic skeletal lesions. 2. Chronic findings include: Aortic Atherosclerosis (ICD10-I70.0). Mild cardiomegaly. Small hiatal hernia. Moderate bilateral gynecomastia. Moderate left colonic diverticulosis.    RADIOGRAPHIC STUDIES: I have personally reviewed the radiological images as listed and agreed with the findings in the report. No results found.   ASSESSMENT & PLAN:  [name] is a 74 y.o. male with    1.   Smoldering multiple Myeloma, normal cytogenetics -He was admitted for AKI and lab showed anemia, positive SPEP with M protein 0.5g/dl on 02/16/20, withsignificantly elevated lambda light chain.  -I discussed his bone marrow biopsy from 02/23/20 which showed 12% plasma cell neoplasm concerning for Multiple Myeloma or Smoldering Myeloma. He does have significant  renal failure required temporary dialysis, anemia, no hypercalcemia or definitive lytic lesions on bone survey. Although he does have diabetes, hypertension, which can cause renal failure and anemia. But  his rapid worsening liver function was concerning for multiple myeloma related renal failure.    -Further lab work up on 02/19/20 showed IgG 2610, IgA, IgM and LDH normal. Beta-2-Microglobulin 6.4 -His PET from 03/23/20 shows negative hypermetabolic low bone lesions.  I reviewed in person and discussed with patient. -He recently came off diuretics, lab reviewed, creatinine improved to 1.64 today and Hg 11.0.  His CKD and anemia are improved. -Based on today's lab results, and negative PET scan, he does not meet criteria for multiple myeloma.  He does meet criteria for smoldering multiple myeloma. -We discussed that he is at high risk for multiple myeloma.  The current standard care for smoldering multiple myeloma is observation.  Clinical trials evaluating the role of early treatment for multiple myeloma. -I recommend close monitoring -We will see him back in 3 months with lab 1 week before   2. CKD  -NO known history of kidney disease.His baselinecreatinine wasaround 1.3 in 2020, and went up with to 1.86 on 08/26/2019 and 2.17 on 10/06/2019.  -He has had AKI requiring temporary hemodialysis, off now  -Since his hospital discharge, off diuretics, his creatinine has improved to 1.6 today -He did have some weight gain lately, I recommend him to follow-up with his nephrologist and cardiologist.   3. Mild anemia -Hg has remained stable lately in 9-10 range. -improved today     4.Comorbidities: Hypertension, DM, Atrial Fibrillation, Chronic back pain  -On Eliquis  -His back pain has increased off Advil.   5. LE weakness, social support  -He still has leg weakness and fatigue since hospital discharge.  -continue home PT   6.History of prostate cancer status post prostatectomy in  2007   PLAN:  -Lab and the PET scan reviewed with patient and his daughter -I recommend observation, no active treatment needed at this point. -Follow-up in 3 months with lab 1 week before    No problem-specific Assessment & Plan notes found for this encounter.   Orders Placed This Encounter  Procedures  . 24-Hr Ur UPEP/UIFE/Light Chains/TP    Standing Status:   Standing    Number of Occurrences:  2    Standing Expiration Date:   03/29/2021   All questions were answered. The patient knows to call the clinic with any problems, questions or concerns. No barriers to learning was detected. The total time spent in the appointment was 30 minutes.     Truitt Merle, MD 03/29/2020   I, Joslyn Devon, am acting as scribe for Truitt Merle, MD.   I have reviewed the above documentation for accuracy and completeness, and I agree with the above.

## 2020-03-29 ENCOUNTER — Other Ambulatory Visit: Payer: Self-pay

## 2020-03-29 ENCOUNTER — Encounter: Payer: Self-pay | Admitting: Hematology

## 2020-03-29 ENCOUNTER — Inpatient Hospital Stay: Payer: Medicare Other | Attending: Hematology | Admitting: Hematology

## 2020-03-29 ENCOUNTER — Telehealth: Payer: Self-pay | Admitting: Hematology

## 2020-03-29 ENCOUNTER — Inpatient Hospital Stay: Payer: Medicare Other

## 2020-03-29 VITALS — BP 173/102 | HR 50 | Temp 97.5°F | Resp 18 | Ht 73.0 in | Wt 249.3 lb

## 2020-03-29 DIAGNOSIS — Z7901 Long term (current) use of anticoagulants: Secondary | ICD-10-CM | POA: Diagnosis not present

## 2020-03-29 DIAGNOSIS — Z8546 Personal history of malignant neoplasm of prostate: Secondary | ICD-10-CM | POA: Diagnosis not present

## 2020-03-29 DIAGNOSIS — G8929 Other chronic pain: Secondary | ICD-10-CM | POA: Diagnosis not present

## 2020-03-29 DIAGNOSIS — D649 Anemia, unspecified: Secondary | ICD-10-CM | POA: Diagnosis not present

## 2020-03-29 DIAGNOSIS — I129 Hypertensive chronic kidney disease with stage 1 through stage 4 chronic kidney disease, or unspecified chronic kidney disease: Secondary | ICD-10-CM | POA: Insufficient documentation

## 2020-03-29 DIAGNOSIS — M545 Low back pain: Secondary | ICD-10-CM | POA: Insufficient documentation

## 2020-03-29 DIAGNOSIS — I4891 Unspecified atrial fibrillation: Secondary | ICD-10-CM | POA: Diagnosis not present

## 2020-03-29 DIAGNOSIS — C9000 Multiple myeloma not having achieved remission: Secondary | ICD-10-CM

## 2020-03-29 DIAGNOSIS — E1122 Type 2 diabetes mellitus with diabetic chronic kidney disease: Secondary | ICD-10-CM | POA: Insufficient documentation

## 2020-03-29 DIAGNOSIS — N189 Chronic kidney disease, unspecified: Secondary | ICD-10-CM | POA: Insufficient documentation

## 2020-03-29 LAB — CBC WITH DIFFERENTIAL (CANCER CENTER ONLY)
Abs Immature Granulocytes: 0.02 10*3/uL (ref 0.00–0.07)
Basophils Absolute: 0 10*3/uL (ref 0.0–0.1)
Basophils Relative: 0 %
Eosinophils Absolute: 0.1 10*3/uL (ref 0.0–0.5)
Eosinophils Relative: 2 %
HCT: 35.8 % — ABNORMAL LOW (ref 39.0–52.0)
Hemoglobin: 11 g/dL — ABNORMAL LOW (ref 13.0–17.0)
Immature Granulocytes: 0 %
Lymphocytes Relative: 24 %
Lymphs Abs: 1.2 10*3/uL (ref 0.7–4.0)
MCH: 28.4 pg (ref 26.0–34.0)
MCHC: 30.7 g/dL (ref 30.0–36.0)
MCV: 92.5 fL (ref 80.0–100.0)
Monocytes Absolute: 0.5 10*3/uL (ref 0.1–1.0)
Monocytes Relative: 10 %
Neutro Abs: 3.3 10*3/uL (ref 1.7–7.7)
Neutrophils Relative %: 64 %
Platelet Count: 201 10*3/uL (ref 150–400)
RBC: 3.87 MIL/uL — ABNORMAL LOW (ref 4.22–5.81)
RDW: 14.9 % (ref 11.5–15.5)
WBC Count: 5.2 10*3/uL (ref 4.0–10.5)
nRBC: 0 % (ref 0.0–0.2)

## 2020-03-29 LAB — CMP (CANCER CENTER ONLY)
ALT: 8 U/L (ref 0–44)
AST: 16 U/L (ref 15–41)
Albumin: 3 g/dL — ABNORMAL LOW (ref 3.5–5.0)
Alkaline Phosphatase: 120 U/L (ref 38–126)
Anion gap: 5 (ref 5–15)
BUN: 18 mg/dL (ref 8–23)
CO2: 22 mmol/L (ref 22–32)
Calcium: 8.9 mg/dL (ref 8.9–10.3)
Chloride: 115 mmol/L — ABNORMAL HIGH (ref 98–111)
Creatinine: 1.64 mg/dL — ABNORMAL HIGH (ref 0.61–1.24)
GFR, Est AFR Am: 47 mL/min — ABNORMAL LOW (ref >60)
GFR, Estimated: 41 mL/min — ABNORMAL LOW (ref >60)
Glucose, Bld: 109 mg/dL — ABNORMAL HIGH (ref 70–99)
Potassium: 4.3 mmol/L (ref 3.5–5.1)
Sodium: 142 mmol/L (ref 135–145)
Total Bilirubin: 0.4 mg/dL (ref 0.3–1.2)
Total Protein: 8.1 g/dL (ref 6.5–8.1)

## 2020-03-29 NOTE — Telephone Encounter (Signed)
Scheduled appointment per 9/9 los. Patient is aware of appointments and gave patient updated calendar.

## 2020-03-30 ENCOUNTER — Telehealth: Payer: Self-pay | Admitting: Cardiology

## 2020-03-30 ENCOUNTER — Encounter: Payer: Self-pay | Admitting: Hematology

## 2020-03-30 DIAGNOSIS — I110 Hypertensive heart disease with heart failure: Secondary | ICD-10-CM

## 2020-03-30 LAB — KAPPA/LAMBDA LIGHT CHAINS
Kappa free light chain: 48.2 mg/L — ABNORMAL HIGH (ref 3.3–19.4)
Kappa, lambda light chain ratio: 0.27 (ref 0.26–1.65)
Lambda free light chains: 177.8 mg/L — ABNORMAL HIGH (ref 5.7–26.3)

## 2020-03-30 MED ORDER — FUROSEMIDE 40 MG PO TABS: ORAL_TABLET | ORAL | 6 refills | Status: DC

## 2020-03-30 NOTE — Telephone Encounter (Signed)
    Pt c/o medication issue:  1. Name of Medication: amLODipine (NORVASC) 5 MG tablet  2. How are you currently taking this medication (dosage and times per day)? Take 1 tablet (5 mg total) by mouth daily.  3. Are you having a reaction (difficulty breathing--STAT)?   4. What is your medication issue? [name] calling, she would like to speak with Dr. Allison Quarry nurse. She said she has question about this medication.

## 2020-03-30 NOTE — Telephone Encounter (Signed)
There is a pretty significant change in weight of 9 pounds in 2days.  He would have a significant amount of swelling if there was 9 pounds of weight gain.  It could very well be an issue with different scales.  As far as the blood pressure being high -I think we probably are going to have to have him back on 10 mg of amlodipine eventually, but was hoping to gradually build up to 10 mg.  However if his pressures are still is high as these recordings that I think 10 is probably be where we end up.   If his blood pressures are remaining greater than 702 mmHg systolic currently with the 5 mg amlodipine, I agree with taking 10 mg.  (2 tabs). --I suspect that we will need to readjust his medication prescriptions, but would like to wait to see with finally come up with.  I also think he is in a probably need to be back on a standing dose of Lasix because there probably is some component of weight gain from fluid.  Amlodipine will cause a little bit of swelling at higher doses as 1 of its potential side effects, but for now it is best option we have available.  All blood pressure medications will do some of that.  Would like to start 40 mg of Lasix on Monday Wednesday and Friday and then as needed for weight gain more than 3 pounds in a day.  If we need to send a prescription for him to get a functional scale at home then we should do so.  It would be nice for him to have a functional scale at home that can correlate to the weights that he gets when he comes into the clinic visit here.  It would also be very helpful if he can do home blood pressure recordings over the next few weeks, and then probably have him come back for CVRR BP check with chemistry panel (BMP) in 2 weeks so he will run out of medication doses.  Glenetta Hew, MD

## 2020-03-30 NOTE — Telephone Encounter (Signed)
Spoke to patient's daughter [name].She stated when father saw Dr.[name] this past Tue his weight 238.Stated he went to cancer center on Wed weight 249.Stated B/P elevated 170/108.Stated she gave him Amlodipine 10 mg daily for the past 2 days.This morning B/P 145/96 pulse 72.Stated his scales are broke unable to weigh.Swelling in both lower legs no worse.No sob.Stated she is concerned about increase in weight and elevated B/P.Advised I will send message to [place] for advice.

## 2020-03-30 NOTE — Telephone Encounter (Signed)
Spoke to patient's daughter [name] Dr.[name]'s advice given.Advised take Amlodipine 5 mg daily only take Amlodipine 10 mg if systolic B/P greater than 140.Take Lasix 40 mg three times a week Mon-Wed-Fri.Scheduler will call back with a Pharmacist appointment in 2 weeks.Bmet to be done then.Advised to monitor B/P and bring readings to appointment.

## 2020-04-02 LAB — MULTIPLE MYELOMA PANEL, SERUM
Albumin SerPl Elph-Mcnc: 3 g/dL (ref 2.9–4.4)
Albumin/Glob SerPl: 0.8 (ref 0.7–1.7)
Alpha 1: 0.3 g/dL (ref 0.0–0.4)
Alpha2 Glob SerPl Elph-Mcnc: 0.6 g/dL (ref 0.4–1.0)
B-Globulin SerPl Elph-Mcnc: 0.8 g/dL (ref 0.7–1.3)
Gamma Glob SerPl Elph-Mcnc: 2.4 g/dL — ABNORMAL HIGH (ref 0.4–1.8)
Globulin, Total: 4.1 g/dL — ABNORMAL HIGH (ref 2.2–3.9)
IgA: 133 mg/dL (ref 61–437)
IgG (Immunoglobin G), Serum: 2846 mg/dL — ABNORMAL HIGH (ref 603–1613)
IgM (Immunoglobulin M), Srm: 32 mg/dL (ref 15–143)
M Protein SerPl Elph-Mcnc: 1.1 g/dL — ABNORMAL HIGH
Total Protein ELP: 7.1 g/dL (ref 6.0–8.5)

## 2020-04-04 ENCOUNTER — Ambulatory Visit: Payer: Medicare Other | Admitting: Internal Medicine

## 2020-04-04 ENCOUNTER — Encounter: Payer: Self-pay | Admitting: Cardiology

## 2020-04-04 DIAGNOSIS — Z0289 Encounter for other administrative examinations: Secondary | ICD-10-CM

## 2020-04-04 NOTE — Assessment & Plan Note (Signed)
LDL was 80 on last check.  Currently on pravastatin, tolerating relatively well.  Labs followed by PCP.

## 2020-04-04 NOTE — Assessment & Plan Note (Signed)
No further episodes.  Suspect this may have been related to electrolyte abnormalities.

## 2020-04-04 NOTE — Assessment & Plan Note (Signed)
Remains pretty much permanently in A. fib now.  No longer on amiodarone.  He does not notice if he is or is not in A. fib, but he clearly is probably exacerbating some of his diastolic heart failure.  I think his most recent issue was related to renal failure more so than heart failure.  He is on 50 mg twice daily metoprolol for rate control along with apixaban for antiarrhythmic.  We talked about potential options if he were to have symptomatic tachycardic A. Fib.  Certainly cardioversion is probably not likely to be a successful option, therefore I think rate control is the only option having stopped amiodarone in the past.  Continue rate control.

## 2020-04-04 NOTE — Assessment & Plan Note (Signed)
Combination of the significant deconditioning with recent hospitalization in conjunction with A. fib and diastolic dysfunction. His weight is also clearly an issue.  Continue to try to titrate hypertension control and reinitiate diuretic.

## 2020-04-04 NOTE — Assessment & Plan Note (Signed)
As his weight is now going up, I think we probably need to get him back on a diuretic.  He had previously been on spironolactone and furosemide.  With renal function being an issue, would probably not consider spironolactone, but need to consider restarting a diuretic.

## 2020-04-04 NOTE — Assessment & Plan Note (Addendum)
Has been on a relatively decent regiment up until his recent hospitalization, now we need to get him back on his meds.  A bit concerned about his weight gain going up.  Difficult to diurese with fluctuating renal function.  Now his renal indices improved.  For now, I think we need to get his blood pressure back down.  I am reluctant to adjust his diuretic dosing, but defer to his nephrologist.  I do think he probably needs to be back on standing dose of diuretic. Plan: Start low-dose amlodipine and titrate accordingly.--Need to determine follow-up renal function to decide about reinitiation of standard diuretic.

## 2020-04-11 ENCOUNTER — Ambulatory Visit: Payer: Medicare Other

## 2020-05-01 ENCOUNTER — Ambulatory Visit: Payer: Medicare Other

## 2020-05-03 ENCOUNTER — Telehealth: Payer: Self-pay | Admitting: Podiatry

## 2020-05-03 ENCOUNTER — Ambulatory Visit: Payer: Medicare Other | Admitting: Podiatry

## 2020-05-03 NOTE — Telephone Encounter (Signed)
Patients daughter called in to get appointment scheduled for diabetic foot care, patient then called canceled appointment and  rescheduled then called again and stated he would have daughter call in to get another appointment scheduled to accommodate schedule due to daughter being primary source of transportation

## 2020-05-18 ENCOUNTER — Other Ambulatory Visit: Payer: Self-pay

## 2020-05-18 ENCOUNTER — Ambulatory Visit: Payer: Medicare Other | Admitting: Podiatry

## 2020-05-18 VITALS — BP 131/72 | HR 61 | Temp 98.2°F

## 2020-05-18 DIAGNOSIS — E119 Type 2 diabetes mellitus without complications: Secondary | ICD-10-CM | POA: Diagnosis not present

## 2020-05-18 DIAGNOSIS — L602 Onychogryphosis: Secondary | ICD-10-CM

## 2020-05-18 DIAGNOSIS — B351 Tinea unguium: Secondary | ICD-10-CM

## 2020-05-18 DIAGNOSIS — M79674 Pain in right toe(s): Secondary | ICD-10-CM

## 2020-05-18 DIAGNOSIS — I890 Lymphedema, not elsewhere classified: Secondary | ICD-10-CM | POA: Diagnosis not present

## 2020-05-18 DIAGNOSIS — M79675 Pain in left toe(s): Secondary | ICD-10-CM

## 2020-05-20 ENCOUNTER — Encounter: Payer: Self-pay | Admitting: Podiatry

## 2020-05-20 NOTE — Progress Notes (Signed)
  Subjective:  Patient ID: [name], male    DOB: 28-Jul-1945,  MRN: [id number]  Chief Complaint  Patient presents with  . Diabetes    routine foot care     74 y.o. male presents with the above complaint. History confirmed with patient.  He is here today with his daughter, he has not had his nails cut in some time.  He has significant swelling and weeping from the legs.  Objective:  Physical Exam: Bilateral he has moderate to severe lymphedema with +3 pitting edema in the bilateral lower extremities.  There is interdigital maceration.  Severe onychomycosis and onychogryphosis of all 10 digits with curving of the nails around the tips of the toes.  No open wounds are noted.  Assessment:   1. Onychomycosis   2. Pain due to onychomycosis of toenails of both feet   3. Onychogryphosis   4. Lymphedema of both lower extremities      Plan:  Patient was evaluated and treated and all questions answered.  Patient educated on diabetes. Discussed proper diabetic foot care and discussed risks and complications of disease. Educated patient in depth on reasons to return to the office immediately should he/she discover anything concerning or new on the feet. All questions answered. Discussed proper shoes as well.   Discussed the etiology and treatment options for the condition in detail with the patient. Educated patient on the topical and oral treatment options for mycotic nails. Recommended debridement of the nails today. Sharp and mechanical debridement performed of all painful and mycotic nails today. Nails debrided in length and thickness using a nail nipper and a mechanical burr to level of comfort. Discussed treatment options including appropriate shoe gear. Follow up as needed for painful nails.   Return in about 9 weeks (around 07/20/2020) for [facility].

## 2020-05-23 NOTE — Telephone Encounter (Signed)
ok 

## 2020-06-01 ENCOUNTER — Other Ambulatory Visit: Payer: Self-pay | Admitting: Internal Medicine

## 2020-06-05 ENCOUNTER — Other Ambulatory Visit: Payer: Self-pay | Admitting: Cardiology

## 2020-06-05 NOTE — Telephone Encounter (Signed)
*  STAT* If patient is at the pharmacy, call can be transferred to refill team.   1. Which medications need to be refilled? (please list name of each medication and dose if known)  pravastatin (PRAVACHOL) 40 MG tablet  2. Which pharmacy/location (including street and city if local pharmacy) is medication to be sent to? [place] ([place]), [place] - [address]  3. Do they need a 30 day or 90 day supply? 30  Patient is out of medication  Patient normally gets his medication from the mail order but it will not be in time  Please send a MyChart message when the request has been complete so the daughter can go to the pharmacy

## 2020-06-06 MED ORDER — PRAVASTATIN SODIUM 40 MG PO TABS
40.0000 mg | ORAL_TABLET | Freq: Every day | ORAL | 0 refills | Status: DC
Start: 2020-06-06 — End: 2020-06-26

## 2020-06-20 ENCOUNTER — Other Ambulatory Visit: Payer: Medicare Other

## 2020-06-26 MED ORDER — PRAVASTATIN SODIUM 40 MG PO TABS
40.0000 mg | ORAL_TABLET | Freq: Every day | ORAL | 3 refills | Status: DC
Start: 2020-06-26 — End: 2021-07-09

## 2020-06-27 ENCOUNTER — Ambulatory Visit: Payer: Medicare Other | Admitting: Hematology

## 2020-07-04 ENCOUNTER — Ambulatory Visit: Payer: Medicare Other | Admitting: Cardiology

## 2020-07-23 ENCOUNTER — Ambulatory Visit: Payer: Medicare Other | Admitting: Cardiology

## 2020-07-25 ENCOUNTER — Telehealth: Payer: Self-pay | Admitting: Cardiology

## 2020-07-25 ENCOUNTER — Other Ambulatory Visit: Payer: Medicare Other

## 2020-07-25 ENCOUNTER — Ambulatory Visit: Payer: Medicare Other | Admitting: Podiatry

## 2020-07-25 NOTE — Telephone Encounter (Signed)
*  STAT* If patient is at the pharmacy, call can be transferred to refill team.   1. Which medications need to be refilled? (please list name of each medication and dose if known) amLODipine (NORVASC) 5 MG tablet  2. Which pharmacy/location (including street and city if local pharmacy) is medication to be sent to? [place] ([place]), [place] - [address]  3. Do they need a 30 day or 90 day supply? 90 day supply  Patient's daughter states due to the patient taking 10 mg of the medication for the past month, the pharmacy will not refill the medication without approval from Dr. Ellyn Hack.

## 2020-07-26 ENCOUNTER — Telehealth: Payer: Self-pay | Admitting: Hematology

## 2020-07-26 NOTE — Telephone Encounter (Signed)
Called pt per 1/5 sch msg - no answer. Left message for pt to call back to reschedule missed appt.

## 2020-07-30 NOTE — Progress Notes (Incomplete)
[place]   [phone] Fax:([phone]) [phone]   Clinic Follow up Note   Patient Care Team: Biagio Borg, MD as PCP - General Ellyn Hack Leonie Green, MD as PCP - Cardiology (Cardiology)  Date of Service:  07/30/2020  CHIEF COMPLAINT: F/u of MM vs SMM   CURRENT THERAPY:  Observation  INTERVAL HISTORY: *** [name] is here for a follow up. He was last seen by me 4 months ago. He presents to the clinic alone.    REVIEW OF SYSTEMS:  *** Constitutional: Denies fevers, chills or abnormal weight loss Eyes: Denies blurriness of vision Ears, nose, mouth, throat, and face: Denies mucositis or sore throat Respiratory: Denies cough, dyspnea or wheezes Cardiovascular: Denies palpitation, chest discomfort or lower extremity swelling Gastrointestinal:  Denies nausea, heartburn or change in bowel habits Skin: Denies abnormal skin rashes Lymphatics: Denies new lymphadenopathy or easy bruising Neurological:Denies numbness, tingling or new weaknesses Behavioral/Psych: Mood is stable, no new changes  All other systems were reviewed with the patient and are negative.  MEDICAL HISTORY:  Past Medical History:  Diagnosis Date  . Adenoma 05/2008  . Alcoholism in recovery ([place])   . BPH (benign prostatic hyperplasia)   . Cancer [facility])    Prostate  . Chronic LBP    Hip & Back -- Sees Dr. Maia Petties @ [place]  . Colon polyps   . Complex renal cyst 12/2010  . Controlled type 2 diabetes mellitus with neuropathy ([place]) 01/2007  . COPD (chronic obstructive pulmonary disease) ([place])   . Diabetes ([place])   . Diverticulitis of colon   . ED (erectile dysfunction)    s/p Penile prosthesis (09/2010)  . History of prostate cancer    Dr. Karsten Ro  . History of sick sinus syndrome    reduced BB dose for Bradycardia  . HLD (hyperlipidemia)   . Hypertension   . Impaired glucose tolerance 12/21/2013  . Nephrolithiasis   . Osteoarthritis of both hips   .  Osteoarthritis of lumbosacral spine    with Disc Disease  . PAF (paroxysmal atrial fibrillation) ([facility])    No longer on Amiodarone.  Not on Anticoaguation b/c no recurrence.  . Paresthesias/numbness    Bilateral LE  . Peripheral neuropathy 12/21/2013    SURGICAL HISTORY: Past Surgical History:  Procedure Laterality Date  . CARDIAC CATHETERIZATION  2003   Normal Coronary Arteries.  Marland Kitchen NM MYOVIEW LTD  02/21/2020   EF 60%.  Medium sized mild severity defect in the basal inferior, mid inferior and apical inferior location-suggesting of ischemia.  Read as low-intermediate risk.  (On cardiology review this is felt to be a fixed defect either related to prior infarct versus diaphragmatic attenuation.  Felt to be low risk)  . PENILE PROSTHESIS IMPLANT  06/21/2012   Procedure: PENILE PROTHESIS INFLATABLE;  Surgeon: Claybon Jabs, MD;  Location: [facility];  Service: Urology;  Laterality: N/A;  REMOVAL AND REPLACEMENT OF SOME  OF PROSTHESIS ([facility])   . PENILE PROSTHESIS PLACEMENT  09/2010  . PROSTATECTOMY    . REMOVAL OF PENILE PROSTHESIS N/A 02/15/2018   Procedure: REMOVAL OF PENILE PROSTHESIS;  Surgeon: Kathie Rhodes, MD;  Location: [facility];  Service: Urology;  Laterality: N/A;  . TRANSTHORACIC ECHOCARDIOGRAM  04/2016   a) Relatively normal EF of 55-60%. No RWMA suggesting no prior MI. Gr 2 DD (pseudo-normal filling pattern) along with moderately dilated left atrium.;; b) 02/2018: Mod LVH. EF 65-70%. No RWMA.  Unable to assess DF 2/2  Afib. Mild MR. Severe BiAtrial Enlargement. High CVP.  Marland Kitchen TRANSTHORACIC ECHOCARDIOGRAM  02/17/2020   EF 60 to 65%.  No R WMA.  Unable to assess diastolic parameters because of A. fib.  Mildly elevated PA pressures.  Mild LA dilation.  Mild aortic valve sclerosis but no stenosis.  Normal IVC.    I have reviewed the social history and family history with the patient and they are unchanged from previous note.  ALLERGIES:  is allergic to  ciprofloxacin.  MEDICATIONS:  Current Outpatient Medications  Medication Sig Dispense Refill  . acetaminophen (TYLENOL) 325 MG tablet Take 2 tablets (650 mg total) by mouth every 6 (six) hours as needed for mild pain or moderate pain.    Marland Kitchen amLODipine (NORVASC) 5 MG tablet Take 5 mg daily. Take 10 mg if systolic blood pressure greater than 140 180 tablet 3  . apixaban (ELIQUIS) 5 MG TABS tablet Take 1 tablet (5 mg total) by mouth 2 (two) times daily. 60 tablet 0  . furosemide (LASIX) 40 MG tablet Take 40 mg 3 times a week   Mon-Wed-Fri 30 tablet 6  . loratadine (CLARITIN) 10 MG tablet Take 10 mg by mouth daily as needed for allergies.    . metoprolol tartrate (LOPRESSOR) 50 MG tablet Take 1 tablet (50 mg total) by mouth 2 (two) times daily. 180 tablet 3  . polyethylene glycol (MIRALAX / GLYCOLAX) 17 g packet Take 17 g by mouth daily as needed for moderate constipation. 7 each 0  . pravastatin (PRAVACHOL) 40 MG tablet Take 1 tablet (40 mg total) by mouth daily. 90 tablet 3  . PROAIR HFA 108 (90 Base) MCG/ACT inhaler INHALE 2 PUFFS INTO THE  LUNGS EVERY 6 HOURS AS  NEEDED 34 g 3   No current facility-administered medications for this visit.    PHYSICAL EXAMINATION: ECOG PERFORMANCE STATUS: {CHL ONC ECOG [phone]}  There were no vitals filed for this visit. There were no vitals filed for this visit. *** GENERAL:alert, no distress and comfortable SKIN: skin color, texture, turgor are normal, no rashes or significant lesions EYES: normal, Conjunctiva are pink and non-injected, sclera clear {OROPHARYNX:no exudate, no erythema and lips, buccal mucosa, and tongue normal}  NECK: supple, thyroid normal size, non-tender, without nodularity LYMPH:  no palpable lymphadenopathy in the cervical, axillary {or inguinal} LUNGS: clear to auscultation and percussion with normal breathing effort HEART: regular rate & rhythm and no murmurs and no lower extremity edema ABDOMEN:abdomen soft, non-tender  and normal bowel sounds Musculoskeletal:no cyanosis of digits and no clubbing  NEURO: alert & oriented x 3 with fluent speech, no focal motor/sensory deficits  LABORATORY DATA:  I have reviewed the data as listed CBC Latest Ref Rng & Units 03/29/2020 03/01/2020 02/23/2020  WBC 4.0 - 10.5 K/uL 5.2 4.6 4.7  Hemoglobin 13.0 - 17.0 g/dL 11.0(L) 9.9(L) 9.5(L)  Hematocrit 39.0 - 52.0 % 35.8(L) 32.2(L) 30.5(L)  Platelets 150 - 400 K/uL 201 194 141(L)     CMP Latest Ref Rng & Units 03/29/2020 03/01/2020 02/23/2020  Glucose 70 - 99 mg/dL 109(H) 92 96  BUN 8 - 23 mg/dL 18 29(H) 31(H)  Creatinine 0.61 - 1.24 mg/dL 1.64(H) 2.38(H) 2.33(H)  Sodium 135 - 145 mmol/L 142 137 136  Potassium 3.5 - 5.1 mmol/L 4.3 3.9 3.6  Chloride 98 - 111 mmol/L 115(H) 110 104  CO2 22 - 32 mmol/L 22 19(L) 23  Calcium 8.9 - 10.3 mg/dL 8.9 9.2 8.6(L)  Total Protein 6.5 - 8.1 g/dL 8.1 7.3 -  Total Bilirubin 0.3 - 1.2 mg/dL 0.4 0.3 -  Alkaline Phos 38 - 126 U/L 120 93 -  AST 15 - 41 U/L 16 18 -  ALT 0 - 44 U/L 8 13 -      RADIOGRAPHIC STUDIES: I have personally reviewed the radiological images as listed and agreed with the findings in the report. No results found.   ASSESSMENT & PLAN:  [name] is a 75 y.o. male with    1.  Smoldering multiple Myeloma, normal cytogenetics -He wasadmitted for AKI and lab showed anemia, positive SPEP with M protein0.5g/dlon 02/16/20, withsignificantly elevated lambda light chain. -I discussed his bone marrow biopsy from 02/23/20 which showed12% plasma cell neoplasm concerning for Multiple Myeloma or Smoldering Myeloma.He does have significant renal failure requiredtemporary dialysis, anemia, no hypercalcemia or definitive lytic lesions on bone survey.Althoughhe does have diabetes, hypertension, whichcan cause renal failure and anemia. But his rapid worsening liver function was concerning for multiple myeloma related renal failure.   -Further lab work up on 02/19/20  showed IgG 2610, IgA, IgM and LDH normal. Beta-2-Microglobulin 6.4 -His PET from 03/23/20 shows negative hypermetabolic low bone lesions.  I reviewed in person and discussed with patient. -He recently came off diuretics, lab reviewed, creatinine improved to 1.64 today and Hg 11.0.  His CKD and anemia are improved. -Based on today's lab results, and negative PET scan, he does not meet criteria for multiple myeloma.  He does not meet criteria for smoldering multiple myeloma. -We discussed that he is at high risk for multiple myeloma.  The current standard care for smoldering multiple myeloma is observation.  Clinical trials evaluating the role of early treatment for multiple myeloma. -I recommend close monitoring -We will see him back in 3 months with lab 1 week before   2.CKD -NO known history ofkidney disease.His baselinecreatinine wasaround 1.3 in 2020, and went upwithto 1.86 on 08/26/2019 and 2.17 on 10/06/2019.  -He has had AKIrequiring temporary hemodialysis, off now -Since his hospital discharge, off diuretics, his creatinine has improved to 1.6 today -He did have some weight gain lately, I recommend him to follow-up with his nephrologist and cardiologist.   3. Mild anemia -Hg has remained stable lately in 9-10 range. -improved today    4.Comorbidities:Hypertension, DM, Atrial Fibrillation, Chronic back pain -On Eliquis -His back pain has increased off Advil.  5.LE weakness, social support  -He still has leg weakness and fatigue since hospital discharge.  -continue home PT   6.History of prostate cancer status post prostatectomy in 2007   PLAN: -Lab and the PET scan reviewed with patient and his daughter -I recommend observation, no active treatment needed at this point. -Follow-up in 3 months with lab 1 week before    No problem-specific Assessment & Plan notes found for this encounter.   No orders of the defined types were placed in this  encounter.  All questions were answered. The patient knows to call the clinic with any problems, questions or concerns. No barriers to learning was detected. The total time spent in the appointment was {CHL ONC TIME VISIT - [id number]}.     Joslyn Devon 07/30/2020   Oneal Deputy, am acting as scribe for Truitt Merle, MD.   {Add scribe attestation statement}

## 2020-08-01 ENCOUNTER — Ambulatory Visit: Payer: Medicare Other | Admitting: Hematology

## 2020-08-23 ENCOUNTER — Other Ambulatory Visit: Payer: Self-pay

## 2020-08-23 ENCOUNTER — Encounter: Payer: Self-pay | Admitting: Cardiology

## 2020-08-23 ENCOUNTER — Ambulatory Visit: Payer: Medicare Other | Admitting: Cardiology

## 2020-08-23 VITALS — BP 130/80 | HR 55 | Ht 73.0 in | Wt 270.8 lb

## 2020-08-23 DIAGNOSIS — I472 Ventricular tachycardia: Secondary | ICD-10-CM

## 2020-08-23 DIAGNOSIS — R0609 Other forms of dyspnea: Secondary | ICD-10-CM

## 2020-08-23 DIAGNOSIS — I5032 Chronic diastolic (congestive) heart failure: Secondary | ICD-10-CM

## 2020-08-23 DIAGNOSIS — E1140 Type 2 diabetes mellitus with diabetic neuropathy, unspecified: Secondary | ICD-10-CM

## 2020-08-23 DIAGNOSIS — E669 Obesity, unspecified: Secondary | ICD-10-CM

## 2020-08-23 DIAGNOSIS — I110 Hypertensive heart disease with heart failure: Secondary | ICD-10-CM

## 2020-08-23 DIAGNOSIS — R0600 Dyspnea, unspecified: Secondary | ICD-10-CM

## 2020-08-23 DIAGNOSIS — E785 Hyperlipidemia, unspecified: Secondary | ICD-10-CM

## 2020-08-23 DIAGNOSIS — I4729 Other ventricular tachycardia: Secondary | ICD-10-CM

## 2020-08-23 DIAGNOSIS — I4821 Permanent atrial fibrillation: Secondary | ICD-10-CM

## 2020-08-23 DIAGNOSIS — I872 Venous insufficiency (chronic) (peripheral): Secondary | ICD-10-CM

## 2020-08-23 DIAGNOSIS — N1832 Chronic kidney disease, stage 3b: Secondary | ICD-10-CM

## 2020-08-23 MED ORDER — AMLODIPINE BESYLATE 10 MG PO TABS: 10.0000 mg | ORAL_TABLET | Freq: Every day | ORAL | 3 refills | Status: DC

## 2020-08-23 MED ORDER — AMLODIPINE BESYLATE 10 MG PO TABS: 10.0000 mg | ORAL_TABLET | Freq: Every day | ORAL | 1 refills | Status: DC

## 2020-08-23 MED ORDER — FUROSEMIDE 40 MG PO TABS: 40.0000 mg | ORAL_TABLET | Freq: Every day | ORAL | 3 refills | Status: DC

## 2020-08-23 NOTE — Patient Instructions (Addendum)
Medication Instructions:   Increase Amlodipine 10 mg daily    increase Furosemide 40 mg one tablet daily.  May take 1 to  2 tablets if need for swelling   *If you need a refill on your cardiac medications before your next appointment, please call your pharmacy*   Lab Work: CMP BNP Lipid CBC  Today   If you have labs (blood work) drawn today and your tests are completely normal, you will receive your results only by: Marland Kitchen MyChart Message (if you have MyChart) OR . A paper copy in the mail If you have any lab test that is abnormal or we need to change your treatment, we will call you to review the results.   Testing/Procedures: Not needed   Follow-Up: At [facility], you and your health needs are our priority.  As part of our continuing mission to provide you with exceptional heart care, we have created designated Provider Care Teams.  These Care Teams include your primary Cardiologist (physician) and Advanced Practice Providers (APPs -  Physician Assistants and Nurse Practitioners) who all work together to provide you with the care you need, when you need it.     Your next appointment:   6 week(s)  The format for your next appointment:   In Person  Provider:   Glenetta Hew, MD

## 2020-08-23 NOTE — Progress Notes (Signed)
Primary Care Provider: Biagio Borg, MD Cardiologist: Glenetta Hew, MD Electrophysiologist: None  Clinic Note: Chief Complaint  Patient presents with  . Follow-up    Follow-up 3 Month  . Edema    Legs are more swollen; wgt is up   . Atrial Fibrillation    No Sx    ===================================  ASSESSMENT/PLAN   Problem List Items Addressed This Visit    Hypertensive heart disease with chronic diastolic congestive heart failure (HCC) - Primary (Chronic)    Blood pressure looks pretty good and stable day, but I think any more fluid reduction.  Interestingly, his weight is up  Plan:   Increase amlodipine to 10 mg daily.  We are also increasing his diuretic dose to daily-furosemide 40 mg daily with additional 1 tablet PRN weight gain versus edema. ==> Would like nephrology to please address diuretic as well  With renal insufficiency, not on ARB/ACEI.  Stable dose beta-blocker      Relevant Medications   amLODipine (NORVASC) 10 MG tablet   furosemide (LASIX) 40 MG tablet   amLODipine (NORVASC) 10 MG tablet   Other Relevant Orders   EKG 12-Lead (Completed)   Lipid panel (Completed)   Comprehensive metabolic panel (Completed)   CBC (Completed)   Brain natriuretic peptide (Completed)   Permanent atrial fibrillation (HCC) - now off amiodarone; asymptomatic. CHA2DS2-VASc Score -5 (on Xarelto) (Chronic)    Permanent A. fib with metoprolol for rate control.  Baseline rate today is 55 bpm.  He is taking Lopressor 50 mg twice daily and tolerating it      Relevant Medications   amLODipine (NORVASC) 10 MG tablet   furosemide (LASIX) 40 MG tablet   amLODipine (NORVASC) 10 MG tablet   Other Relevant Orders   EKG 12-Lead (Completed)   Comprehensive metabolic panel (Completed)   CBC (Completed)   Brain natriuretic peptide (Completed)   Exertional dyspnea (Chronic)    Combination of diastolic dysfunction with A. fib along with his extreme deconditioning.       Venous insufficiency (chronic) (peripheral) with chronic lower extremity edema (Chronic)    His weight is definitely up now.  We restarted 3 days a week Lasix and he has recently been taking it more frequently.  I think he does have the stenosis Corlanor daily with additional doses as needed.  He previously been on spironolactone, but with renal sufficiency, this is being held.  He has a hard time bending down to put support stockings on.  I think the Unaboot may be a great option for him, for now I would recommend Ace wraps.      Relevant Medications   amLODipine (NORVASC) 10 MG tablet   furosemide (LASIX) 40 MG tablet   amLODipine (NORVASC) 10 MG tablet   Other Relevant Orders   EKG 12-Lead (Completed)   Lipid panel (Completed)   Comprehensive metabolic panel (Completed)   CBC (Completed)   Brain natriuretic peptide (Completed)   Obesity (BMI 30-39.9) (Chronic)    He had been doing really well with weight loss, now his weight is significant up.  Not sure if this is all fluid related. We are increasing his diuretic, but I think it also probably just him.      Diabetes mellitus with neuropathy (HCC) (Chronic)    Last A1c was 6.2.  Not currently on medication.  If he does [facility], would consider SGLT2 inhibitor for additional diuretic benefit      Hyperlipidemia with target LDL less than 100 (  Chronic)    Last LDL was 80.  Continuing on pravastatin. Labs rechecked on clinic today.  Addendum: Recheck LDL is 88 -> we discussed dietary modifications, may need to be more aggressive and consider Zetia.  However still reasonably within goal.      Relevant Medications   amLODipine (NORVASC) 10 MG tablet   furosemide (LASIX) 40 MG tablet   amLODipine (NORVASC) 10 MG tablet   Other Relevant Orders   Lipid panel (Completed)   Comprehensive metabolic panel (Completed)   CKD (chronic kidney disease) stage 3, GFR 30-59 ml/min (HCC) (Chronic)    Rechecking chemistry panel today.   I think we may have to tolerate slightly worsened creatinine in order to make sure that he is not volume overloaded.  Unfortunately, not able to use ARB or ACE inhibitor for afterload reduction/blood pressure.      NSVT (nonsustained ventricular tachycardia) (HCC) (Chronic)    No recurrent episodes on current dose beta-blocker.      Relevant Medications   amLODipine (NORVASC) 10 MG tablet   furosemide (LASIX) 40 MG tablet   amLODipine (NORVASC) 10 MG tablet   RESOLVED: Nonsustained ventricular tachycardia (HCC)   Relevant Medications   amLODipine (NORVASC) 10 MG tablet   furosemide (LASIX) 40 MG tablet   amLODipine (NORVASC) 10 MG tablet   Other Relevant Orders   EKG 12-Lead (Completed)   Lipid panel (Completed)   Comprehensive metabolic panel (Completed)   CBC (Completed)   Brain natriuretic peptide (Completed)     ===================================  HPI:    [name] is a 75 y.o. male with a quite complicated PMH notable for difficult control-HTN, with chronic bilateral LE edema (venous hypertension), permanent A. fib (previously treated with amiodarone-stopped due to lung toxicity), and recent diagnosis of possible multiple myeloma diagnosed in the setting of AKI with metabolic encephalopathy and metabolic acidosis.  He presents today for 41-monthfollow-up.  [name] was last seen on March 27, 2020 as a hospital follow-up from his AKI admission with multiple normal diagnosis.  From Xarelto to Eliquis and his renal sufficiency.  He did have some episode of nausea V. tach elevation, his metoprolol dose was increased to 25 mg twice daily. -> He had been evaluated with an echocardiogram showing normal EF, aortic sclerosis with no stenosis with mildly elevated PAP.  Myoview done shortly after showed low to intermediate risk findings fixed inferior defect and prior infarct vs. diaphragmatic attenuation)  Symptoms of A. fib.  No chest pain is measured.  He  had lost 50 pounds since January, but he gained 9 pounds since discharge.  Was sleeping on a recliner for comfort.  No notable edema.  Was noted to be quite hypertensive.  We restarted amlodipine, but I wanted nephrology to titrate his diuretic.  Continued 50 mg twice daily metoprolol and apixaban & encouraged increased activity.  Needs diuretic and reinitiated by nephrology.  Clearly noted not to be on spironolactone plus furosemide  Recent Hospitalizations: N/A  Reviewed  CV studies:    The following studies were reviewed today: (if available, images/films reviewed: From Epic Chart or Care Everywhere) . n/a:   Interval History:   [name] is here today with either his niece or her daughter.  He has been having worsening edema than usual.  The swelling does not go down as much as it used to with foot elevation, but it is not associated with orthopnea or PND.  His weight is up about 20 pounds from his previous baseline.  He always has fatigue and exertional dyspnea.  Apparently he has been taking more Lasix for the last couple days and has not had any more significant output.  Thankfully, he is not necessarily having any chest tightness or pressure. He does have presignificant arthritis pains which limit his walking.  CV Review of Symptoms (Summary) Cardiovascular ROS: positive for - dyspnea on exertion, edema and Fatigue.  Exercise intolerance which is chronic-partly related to arthritis issues. negative for - chest pain, irregular heartbeat, orthopnea, palpitations, paroxysmal nocturnal dyspnea, rapid heart rate or Lightheadedness, dizziness or syncope/near syncope, TIA/amaurosis fugax, claudication  The patient does not have symptoms concerning for COVID-19 infection (fever, chills, cough, or new shortness of breath).   REVIEWED OF SYSTEMS   Review of Systems  Constitutional: Positive for malaise/fatigue. Negative for weight loss (Significant weight gain).  HENT: Negative for  congestion and nosebleeds.   Respiratory: Positive for shortness of breath (When he does more than routine walking.).   Cardiovascular: Positive for leg swelling.  Gastrointestinal: Negative for blood in stool and melena.  Genitourinary: Negative for hematuria.  Musculoskeletal: Positive for back pain, joint pain and myalgias. Negative for falls.       Limited mobility now.  Walks with a cane/walker or wheelchair for long distances.  Neurological: Positive for dizziness and weakness (Generalized). Negative for focal weakness and headaches.  Psychiatric/Behavioral: Positive for depression (A little bit more down than usual.) and memory loss. The patient is not nervous/anxious (Still very nervous about Covid but doing better) and does not have insomnia (Actually sleeping more than usual.).    I have reviewed and (if needed) personally updated the patient's problem list, medications, allergies, past medical and surgical history, social and family history.   PAST MEDICAL HISTORY   Past Medical History:  Diagnosis Date  . Adenoma 05/2008  . Alcoholism in recovery ([place])   . BPH (benign prostatic hyperplasia)   . Cancer [facility])    Prostate  . Chronic LBP    Hip & Back -- Sees Dr. Maia Petties @ [place]  . Colon polyps   . Complex renal cyst 12/2010  . Controlled type 2 diabetes mellitus with neuropathy ([place]) 01/2007  . COPD (chronic obstructive pulmonary disease) ([place])   . Diabetes ([place])   . Diverticulitis of colon   . ED (erectile dysfunction)    s/p Penile prosthesis (09/2010)  . History of prostate cancer    Dr. Karsten Ro  . History of sick sinus syndrome    reduced BB dose for Bradycardia  . HLD (hyperlipidemia)   . Hypertension   . Impaired glucose tolerance 12/21/2013  . Nephrolithiasis   . Osteoarthritis of both hips   . Osteoarthritis of lumbosacral spine    with Disc Disease  . PAF (paroxysmal atrial fibrillation) (HCC)    No longer on Amiodarone.  Not on  Anticoaguation b/c no recurrence.  . Paresthesias/numbness    Bilateral LE  . Peripheral neuropathy 12/21/2013    PAST SURGICAL HISTORY   Past Surgical History:  Procedure Laterality Date  . LEFT HEART CATH AND CORONARY ANGIOGRAPHY  2003   Normal Coronary Arteries.  Marland Kitchen NM MYOVIEW LTD  02/21/2020   EF 60%.  Medium sized mild severity defect in the basal inferior, mid inferior and apical inferior location-suggesting of ischemia.  Read as low-intermediate risk.  (On cardiology review this is felt to be a fixed defect either related to prior infarct versus diaphragmatic attenuation.  Felt to be low risk)  . PENILE  PROSTHESIS IMPLANT  06/21/2012   Procedure: PENILE PROTHESIS INFLATABLE;  Surgeon: Claybon Jabs, MD;  [facility];  Service: Urology;  Laterality: N/A;  REMOVAL AND REPLACEMENT OF SOME  OF PROSTHESIS (AMS)   . PENILE PROSTHESIS PLACEMENT  09/2010  . PROSTATECTOMY    . REMOVAL OF PENILE PROSTHESIS N/A 02/15/2018   Procedure: REMOVAL OF PENILE PROSTHESIS;  Surgeon: Kathie Rhodes, MD;  Location: [facility];  Service: Urology;  Laterality: N/A;  . TRANSTHORACIC ECHOCARDIOGRAM  04/2016   a) Relatively normal EF of 55-60%. No RWMA suggesting no prior MI. Gr 2 DD (pseudo-normal filling pattern) along with moderately dilated left atrium.;; b) 02/2018: Mod LVH. EF 65-70%. No RWMA.  Unable to assess DF 2/2 Afib. Mild MR. Severe BiAtrial Enlargement. High CVP.  Marland Kitchen TRANSTHORACIC ECHOCARDIOGRAM  02/17/2020   EF 60 to 65%.  No R WMA.  Unable to assess diastolic parameters because of A. fib.  Mildly elevated PA pressures.  Mild LA dilation.  Mild aortic valve sclerosis but no stenosis.  Normal IVC.    Immunization History  Administered Date(s) Administered  . H1N1 08/08/2008  . Influenza Split 06/03/2011, 04/15/2012  . Influenza Whole 04/20/2008, 04/20/2009  . Influenza, High Dose Seasonal PF 06/01/2013, 04/27/2018, 05/11/2019  . Influenza,inj,Quad PF,6+ Mos 04/28/2014  .  Influenza-Unspecified 05/31/2016, 06/20/2017  . Moderna Sars-Covid-2 Vaccination 10/20/2019, 11/17/2019  . Pneumococcal Conjugate-13 12/21/2013  . Pneumococcal Polysaccharide-23 01/19/2008, 07/29/2017  . Td 08/08/2008  . Zoster 05/31/2014    MEDICATIONS/ALLERGIES   Current Meds  Medication Sig  . acetaminophen (TYLENOL) 325 MG tablet Take 2 tablets (650 mg total) by mouth every 6 (six) hours as needed for mild pain or moderate pain.  Marland Kitchen amLODipine (NORVASC) 10 MG tablet Take 1 tablet (10 mg total) by mouth daily.  Marland Kitchen amLODipine (NORVASC) 10 MG tablet Take 1 tablet (10 mg total) by mouth daily.  Marland Kitchen apixaban (ELIQUIS) 5 MG TABS tablet Take 1 tablet (5 mg total) by mouth 2 (two) times daily.  . furosemide (LASIX) 40 MG tablet Take 1 tablet (40 mg total) by mouth daily. May take an additional 40 mg if need for swelling  . loratadine (CLARITIN) 10 MG tablet Take 10 mg by mouth daily as needed for allergies.  . metoprolol tartrate (LOPRESSOR) 50 MG tablet Take 1 tablet (50 mg total) by mouth 2 (two) times daily.  . polyethylene glycol (MIRALAX / GLYCOLAX) 17 g packet Take 17 g by mouth daily as needed for moderate constipation.  . pravastatin (PRAVACHOL) 40 MG tablet Take 1 tablet (40 mg total) by mouth daily.  Marland Kitchen PROAIR HFA 108 (90 Base) MCG/ACT inhaler INHALE 2 PUFFS INTO THE  LUNGS EVERY 6 HOURS AS  NEEDED  . [DISCONTINUED] amLODipine (NORVASC) 5 MG tablet Take 5 mg daily. Take 10 mg if systolic blood pressure greater than 140  . [DISCONTINUED] furosemide (LASIX) 40 MG tablet Take 40 mg 3 times a week   Mon-Wed-Fri    Allergies  Allergen Reactions  . Ciprofloxacin Other (See Comments)    All over weakness    SOCIAL HISTORY/FAMILY HISTORY   Reviewed in Epic:  Pertinent findings:  Social History   Tobacco Use  . Smoking status: Former Smoker    Types: Cigarettes    Quit date: 11/04/1982    Years since quitting: 37.8  . Smokeless tobacco: Never Used  Vaping Use  . Vaping Use: Never  used  Substance Use Topics  . Alcohol use: Yes    Comment: no  alcohol x 1 week wants to stop  . 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   Divorced. 3 grown children. 4 GC.     Quit smoking ~20+ yrs ago.     OBJCTIVE -PE, EKG, labs   Wt Readings from Last 3 Encounters:  08/23/20 270 lb 12.8 oz (122.8 kg)  03/29/20 249 lb 4.8 oz (113.1 kg)  03/27/20 238 lb 9.6 oz (108.2 kg)    Physical Exam: BP 130/80 (BP Location: Left Arm, Patient Position: Sitting)   Pulse (!) 55   Ht '6\' 1"'  (1.854 m)   Wt 270 lb 12.8 oz (122.8 kg)   SpO2 98%   BMI 35.73 kg/m  Physical Exam Vitals reviewed.  Constitutional:      General: He is not in acute distress.    Appearance: He is obese. He is ill-appearing (Chronically ill-appearing). He is not toxic-appearing or diaphoretic.  HENT:     Head: Normocephalic and atraumatic.  Neck:     Vascular: Hepatojugular reflux and JVD (9-10 cm H20) present. No carotid bruit.     Comments: Somewhat stiff neck.  This is chronic.  He has chronic anterior kyphosis Cardiovascular:     Rate and Rhythm: Normal rate. Rhythm irregularly irregular.     Chest Wall: PMI is not displaced.     Pulses: Decreased pulses (Difficult to palpate due to thick skin/edema.).     Heart sounds: S1 normal and S2 normal. Heart sounds are distant. Murmur (1/6 SEM at RUSB-neck) heard.  No friction rub. No gallop.   Neurological:     Mental Status: He is alert.     Adult ECG Report  Rate: 55 ;  Rhythm: atrial fibrillation and Slow ventricular rate, LAFB, LVH with repolarization changes and lateral T wave inversions.;   Narrative Interpretation: Stable  Recent Labs: Due for labs recheck Lab Results  Component Value Date   CHOL 123 10/06/2019   HDL 28.70 (L) 10/06/2019   LDLCALC 80 10/06/2019   TRIG 69.0 10/06/2019   CHOLHDL 4  10/06/2019   Lab Results  Component Value Date   CREATININE 1.64 (H) 03/29/2020   BUN 18 03/29/2020   NA 142 03/29/2020   K 4.3 03/29/2020   CL 115 (H) 03/29/2020   CO2 22 03/29/2020   CBC Latest Ref Rng & Units 03/29/2020 03/01/2020  WBC 3.4 - 10.8 x10E3/uL 5.2 4.6  Hemoglobin 13.0 - 17.7 g/dL 11.0(L) 9.9(L)  Hematocrit 37.5 - 51.0 % 35.8(L) 32.2(L)  Platelets 150 - 450 x10E3/uL 201 194   Lab Results  Component Value Date   HGBA1C 6.2 (H) 02/15/2020    Lab Results  Component Value Date   TSH 1.07 10/06/2019    ==================================================  COVID-19 Education: The signs and symptoms of COVID-19 were discussed with the patient and how to seek care for testing (follow up with PCP or arrange E-visit).   The importance of social distancing and COVID-19 vaccination was discussed today. The patient is practicing social distancing & Masking.   I spent a total of 50 minutes with the patient spent in direct patient consultation.  Additional time spent with chart review  / charting (studies, outside notes, etc): 21 min Total Time: 71 min  Current medicines are reviewed at length with the patient today.  (+/- concerns) n/a  This  visit occurred during the SARS-CoV-2 public health emergency.  Safety protocols were in place, including screening questions prior to the visit, additional usage of staff PPE, and extensive cleaning of exam room while observing appropriate contact time as indicated for disinfecting solutions.  Notice: This dictation was prepared with Dragon dictation along with smaller phrase technology. Any transcriptional errors that result from this process are unintentional and may not be corrected upon review.  Patient Instructions / Medication Changes & Studies & Tests Ordered   Patient Instructions  Medication Instructions:   Increase Amlodipine 10 mg daily    increase Furosemide 40 mg one tablet daily.  May take 1 to  2 tablets if need for  swelling   *If you need a refill on your cardiac medications before your next appointment, please call your pharmacy*   Lab Work: CMP BNP Lipid CBC  Today   If you have labs (blood work) drawn today and your tests are completely normal, you will receive your results only by: Marland Kitchen MyChart Message (if you have MyChart) OR . A paper copy in the mail If you have any lab test that is abnormal or we need to change your treatment, we will call you to review the results.   Testing/Procedures: Not needed   Follow-Up: At [facility], you and your health needs are our priority.  As part of our continuing mission to provide you with exceptional heart care, we have created designated Provider Care Teams.  These Care Teams include your primary Cardiologist (physician) and Advanced Practice Providers (APPs -  Physician Assistants and Nurse Practitioners) who all work together to provide you with the care you need, when you need it.     Your next appointment:   6 week(s)  The format for your next appointment:   In Person  Provider:   Glenetta Hew, MD    Studies Ordered:   Orders Placed This Encounter  Procedures  . Lipid panel  . Comprehensive metabolic panel  . CBC  . Brain natriuretic peptide  . EKG 12-Lead    ADDDENDUM: Lab Results  Component Value Date   CHOL 142 08/23/2020   HDL 41 08/23/2020   LDLCALC 88 08/23/2020   TRIG 62 08/23/2020   CHOLHDL 3.5 08/23/2020   Lab Results  Component Value Date   CREATININE 1.86 (H) 08/23/2020   BUN 22 08/23/2020   NA 138 08/23/2020   K 4.4 08/23/2020   CL 102 08/23/2020   CO2 23 08/23/2020   Lab Results  Component Value Date   WBC 6.6 08/23/2020   HGB 13.3 08/23/2020   HCT 41.2 08/23/2020   MCV 84 08/23/2020   PLT 250 08/23/2020  '    Glenetta Hew, M.D., M.S. Interventional Cardiologist   Pager # [phone] Phone # [phone] [address]. [place], [place] [address]   Thank you for choosing  Heartcare at [facility]!!

## 2020-08-24 LAB — COMPREHENSIVE METABOLIC PANEL
ALT: 11 IU/L (ref 0–44)
AST: 21 IU/L (ref 0–40)
Albumin/Globulin Ratio: 0.7 — ABNORMAL LOW (ref 1.2–2.2)
Albumin: 3.5 g/dL — ABNORMAL LOW (ref 3.7–4.7)
Alkaline Phosphatase: 138 IU/L — ABNORMAL HIGH (ref 44–121)
BUN/Creatinine Ratio: 12 (ref 10–24)
BUN: 22 mg/dL (ref 8–27)
Bilirubin Total: 0.5 mg/dL (ref 0.0–1.2)
CO2: 23 mmol/L (ref 20–29)
Calcium: 9.3 mg/dL (ref 8.6–10.2)
Chloride: 102 mmol/L (ref 96–106)
Creatinine, Ser: 1.86 mg/dL — ABNORMAL HIGH (ref 0.76–1.27)
GFR calc Af Amer: 41 mL/min/{1.73_m2} — ABNORMAL LOW (ref >59)
GFR calc non Af Amer: 35 mL/min/{1.73_m2} — ABNORMAL LOW (ref >59)
Globulin, Total: 4.9 g/dL — ABNORMAL HIGH (ref 1.5–4.5)
Glucose: 109 mg/dL — ABNORMAL HIGH (ref 65–99)
Potassium: 4.4 mmol/L (ref 3.5–5.2)
Sodium: 138 mmol/L (ref 134–144)
Total Protein: 8.4 g/dL (ref 6.0–8.5)

## 2020-08-24 LAB — LIPID PANEL
Chol/HDL Ratio: 3.5 ratio (ref 0.0–5.0)
Cholesterol, Total: 142 mg/dL (ref 100–199)
HDL: 41 mg/dL (ref >39)
LDL Chol Calc (NIH): 88 mg/dL (ref 0–99)
Triglycerides: 62 mg/dL (ref 0–149)
VLDL Cholesterol Cal: 13 mg/dL (ref 5–40)

## 2020-08-24 LAB — CBC
Hematocrit: 41.2 % (ref 37.5–51.0)
Hemoglobin: 13.3 g/dL (ref 13.0–17.7)
MCH: 27 pg (ref 26.6–33.0)
MCHC: 32.3 g/dL (ref 31.5–35.7)
MCV: 84 fL (ref 79–97)
Platelets: 250 10*3/uL (ref 150–450)
RBC: 4.92 x10E6/uL (ref 4.14–5.80)
RDW: 13.9 % (ref 11.6–15.4)
WBC: 6.6 10*3/uL (ref 3.4–10.8)

## 2020-08-24 LAB — BRAIN NATRIURETIC PEPTIDE: BNP: 377.8 pg/mL — ABNORMAL HIGH (ref 0.0–100.0)

## 2020-08-31 ENCOUNTER — Other Ambulatory Visit: Payer: Self-pay

## 2020-09-04 ENCOUNTER — Other Ambulatory Visit: Payer: Self-pay

## 2020-09-05 ENCOUNTER — Telehealth: Payer: Self-pay | Admitting: Internal Medicine

## 2020-09-05 NOTE — Telephone Encounter (Signed)
Oh yes, these can be done together with no problems

## 2020-09-05 NOTE — Telephone Encounter (Signed)
Patient wanted to ask about some different shots and if it would be safe to be taken together. He wants the pneumonia shot and shingles shot and wants to know if he can get those and if he can get them together.

## 2020-09-07 ENCOUNTER — Telehealth: Payer: Self-pay

## 2020-09-07 NOTE — Telephone Encounter (Signed)
Pt notified they can have pneumonia and shingles shot together per Dr. Jenny Reichmann

## 2020-09-08 ENCOUNTER — Encounter: Payer: Self-pay | Admitting: Cardiology

## 2020-09-08 NOTE — Assessment & Plan Note (Signed)
No recurrent episodes on current dose beta-blocker.

## 2020-09-08 NOTE — Assessment & Plan Note (Signed)
Last LDL was 80.  Continuing on pravastatin. Labs rechecked on clinic today.  Addendum: Recheck LDL is 88 -> we discussed dietary modifications, may need to be more aggressive and consider Zetia.  However still reasonably within goal.

## 2020-09-08 NOTE — Assessment & Plan Note (Addendum)
His weight is definitely up now.  We restarted 3 days a week Lasix and he has recently been taking it more frequently.  I think he does have the stenosis Corlanor daily with additional doses as needed.  He previously been on spironolactone, but with renal sufficiency, this is being held.  He has a hard time bending down to put support stockings on.  I think the Unaboot may be a great option for him, for now I would recommend Ace wraps.

## 2020-09-08 NOTE — Assessment & Plan Note (Addendum)
He had been doing really well with weight loss, now his weight is significant up.  Not sure if this is all fluid related. We are increasing his diuretic, but I think it also probably just him.

## 2020-09-08 NOTE — Assessment & Plan Note (Signed)
Rechecking chemistry panel today.  I think we may have to tolerate slightly worsened creatinine in order to make sure that he is not volume overloaded.  Unfortunately, not able to use ARB or ACE inhibitor for afterload reduction/blood pressure.

## 2020-09-08 NOTE — Assessment & Plan Note (Signed)
Last A1c was 6.2.  Not currently on medication.  If he does [facility], would consider SGLT2 inhibitor for additional diuretic benefit

## 2020-09-08 NOTE — Assessment & Plan Note (Signed)
Combination of diastolic dysfunction with A. fib along with his extreme deconditioning.

## 2020-09-08 NOTE — Assessment & Plan Note (Addendum)
Blood pressure looks pretty good and stable day, but I think any more fluid reduction.  Interestingly, his weight is up  Plan:   Increase amlodipine to 10 mg daily.  We are also increasing his diuretic dose to daily-furosemide 40 mg daily with additional 1 tablet PRN weight gain versus edema. ==> Would like nephrology to please address diuretic as well  With renal insufficiency, not on ARB/ACEI.  Stable dose beta-blocker

## 2020-09-08 NOTE — Assessment & Plan Note (Signed)
Permanent A. fib with metoprolol for rate control.  Baseline rate today is 55 bpm.  He is taking Lopressor 50 mg twice daily and tolerating it

## 2020-09-14 ENCOUNTER — Telehealth: Payer: Self-pay | Admitting: Internal Medicine

## 2020-09-14 ENCOUNTER — Ambulatory Visit: Payer: Medicare Other | Admitting: Internal Medicine

## 2020-09-14 NOTE — Telephone Encounter (Signed)
Patient called and said that he has lost his lancet pen. It was difficult to understand the patient. On patients med list he has Lancets (ONETOUCH DELICA PLUS [id number]) [place]. Please call him back at [phone].

## 2020-09-17 NOTE — Telephone Encounter (Signed)
Return pt call to gain some clarification however pt voicemail was full.

## 2020-09-19 ENCOUNTER — Other Ambulatory Visit: Payer: Self-pay

## 2020-09-19 NOTE — Telephone Encounter (Signed)
Clarification given to pharmacist.

## 2020-09-19 NOTE — Telephone Encounter (Signed)
[facility] calling to clarify instructions One Touch device Please call [phone] Ref# [id number]

## 2020-09-26 ENCOUNTER — Ambulatory Visit: Payer: Medicare Other | Admitting: Cardiology

## 2020-10-22 ENCOUNTER — Ambulatory Visit (INDEPENDENT_AMBULATORY_CARE_PROVIDER_SITE_OTHER): Payer: Medicare Other | Admitting: Internal Medicine

## 2020-10-22 ENCOUNTER — Observation Stay (HOSPITAL_COMMUNITY): Payer: Medicare Other

## 2020-10-22 ENCOUNTER — Ambulatory Visit: Payer: Medicare Other | Admitting: Internal Medicine

## 2020-10-22 ENCOUNTER — Other Ambulatory Visit: Payer: Self-pay

## 2020-10-22 ENCOUNTER — Inpatient Hospital Stay (HOSPITAL_COMMUNITY)
Admission: EM | Admit: 2020-10-22 | Discharge: 2020-10-26 | DRG: 872 | Disposition: A | Discharge status: Skilled Nursing Facility | Payer: Medicare Other | Attending: Internal Medicine | Admitting: Internal Medicine

## 2020-10-22 ENCOUNTER — Emergency Department (HOSPITAL_COMMUNITY): Payer: Medicare Other

## 2020-10-22 ENCOUNTER — Encounter: Payer: Self-pay | Admitting: Internal Medicine

## 2020-10-22 VITALS — BP 122/70 | HR 75 | Temp 98.3°F | Ht 73.0 in

## 2020-10-22 DIAGNOSIS — Z6835 Body mass index (BMI) 35.0-35.9, adult: Secondary | ICD-10-CM | POA: Diagnosis not present

## 2020-10-22 DIAGNOSIS — Z8546 Personal history of malignant neoplasm of prostate: Secondary | ICD-10-CM

## 2020-10-22 DIAGNOSIS — N390 Urinary tract infection, site not specified: Secondary | ICD-10-CM | POA: Diagnosis not present

## 2020-10-22 DIAGNOSIS — E871 Hypo-osmolality and hyponatremia: Secondary | ICD-10-CM | POA: Diagnosis present

## 2020-10-22 DIAGNOSIS — N1832 Chronic kidney disease, stage 3b: Secondary | ICD-10-CM | POA: Diagnosis present

## 2020-10-22 DIAGNOSIS — E1142 Type 2 diabetes mellitus with diabetic polyneuropathy: Secondary | ICD-10-CM | POA: Diagnosis not present

## 2020-10-22 DIAGNOSIS — Z833 Family history of diabetes mellitus: Secondary | ICD-10-CM | POA: Diagnosis not present

## 2020-10-22 DIAGNOSIS — I4821 Permanent atrial fibrillation: Secondary | ICD-10-CM | POA: Diagnosis not present

## 2020-10-22 DIAGNOSIS — Z79899 Other long term (current) drug therapy: Secondary | ICD-10-CM | POA: Diagnosis not present

## 2020-10-22 DIAGNOSIS — R5381 Other malaise: Secondary | ICD-10-CM | POA: Diagnosis not present

## 2020-10-22 DIAGNOSIS — R609 Edema, unspecified: Secondary | ICD-10-CM | POA: Diagnosis not present

## 2020-10-22 DIAGNOSIS — R300 Dysuria: Secondary | ICD-10-CM | POA: Diagnosis not present

## 2020-10-22 DIAGNOSIS — K5792 Diverticulitis of intestine, part unspecified, without perforation or abscess without bleeding: Secondary | ICD-10-CM

## 2020-10-22 DIAGNOSIS — E669 Obesity, unspecified: Secondary | ICD-10-CM | POA: Diagnosis present

## 2020-10-22 DIAGNOSIS — R6520 Severe sepsis without septic shock: Secondary | ICD-10-CM | POA: Diagnosis not present

## 2020-10-22 DIAGNOSIS — Z20822 Contact with and (suspected) exposure to covid-19: Secondary | ICD-10-CM | POA: Diagnosis not present

## 2020-10-22 DIAGNOSIS — R0602 Shortness of breath: Secondary | ICD-10-CM | POA: Diagnosis not present

## 2020-10-22 DIAGNOSIS — I517 Cardiomegaly: Secondary | ICD-10-CM | POA: Diagnosis not present

## 2020-10-22 DIAGNOSIS — R21 Rash and other nonspecific skin eruption: Secondary | ICD-10-CM | POA: Diagnosis not present

## 2020-10-22 DIAGNOSIS — A419 Sepsis, unspecified organism: Secondary | ICD-10-CM | POA: Diagnosis not present

## 2020-10-22 DIAGNOSIS — E119 Type 2 diabetes mellitus without complications: Secondary | ICD-10-CM | POA: Diagnosis not present

## 2020-10-22 DIAGNOSIS — S0990XA Unspecified injury of head, initial encounter: Secondary | ICD-10-CM | POA: Diagnosis not present

## 2020-10-22 DIAGNOSIS — I5032 Chronic diastolic (congestive) heart failure: Secondary | ICD-10-CM | POA: Diagnosis present

## 2020-10-22 DIAGNOSIS — E876 Hypokalemia: Secondary | ICD-10-CM | POA: Diagnosis not present

## 2020-10-22 DIAGNOSIS — J449 Chronic obstructive pulmonary disease, unspecified: Secondary | ICD-10-CM | POA: Diagnosis present

## 2020-10-22 DIAGNOSIS — E1122 Type 2 diabetes mellitus with diabetic chronic kidney disease: Secondary | ICD-10-CM | POA: Diagnosis present

## 2020-10-22 DIAGNOSIS — N179 Acute kidney failure, unspecified: Secondary | ICD-10-CM | POA: Diagnosis present

## 2020-10-22 DIAGNOSIS — I872 Venous insufficiency (chronic) (peripheral): Secondary | ICD-10-CM | POA: Diagnosis not present

## 2020-10-22 DIAGNOSIS — E538 Deficiency of other specified B group vitamins: Secondary | ICD-10-CM

## 2020-10-22 DIAGNOSIS — I130 Hypertensive heart and chronic kidney disease with heart failure and stage 1 through stage 4 chronic kidney disease, or unspecified chronic kidney disease: Secondary | ICD-10-CM | POA: Diagnosis present

## 2020-10-22 DIAGNOSIS — W19XXXA Unspecified fall, initial encounter: Secondary | ICD-10-CM

## 2020-10-22 DIAGNOSIS — R2689 Other abnormalities of gait and mobility: Secondary | ICD-10-CM | POA: Diagnosis not present

## 2020-10-22 DIAGNOSIS — S3991XA Unspecified injury of abdomen, initial encounter: Secondary | ICD-10-CM | POA: Diagnosis not present

## 2020-10-22 DIAGNOSIS — A4159 Other Gram-negative sepsis: Secondary | ICD-10-CM | POA: Diagnosis not present

## 2020-10-22 DIAGNOSIS — Z7901 Long term (current) use of anticoagulants: Secondary | ICD-10-CM

## 2020-10-22 DIAGNOSIS — N183 Chronic kidney disease, stage 3 unspecified: Secondary | ICD-10-CM | POA: Diagnosis present

## 2020-10-22 DIAGNOSIS — R2681 Unsteadiness on feet: Secondary | ICD-10-CM | POA: Diagnosis not present

## 2020-10-22 DIAGNOSIS — E1165 Type 2 diabetes mellitus with hyperglycemia: Secondary | ICD-10-CM | POA: Diagnosis present

## 2020-10-22 DIAGNOSIS — E559 Vitamin D deficiency, unspecified: Secondary | ICD-10-CM | POA: Diagnosis not present

## 2020-10-22 DIAGNOSIS — Z043 Encounter for examination and observation following other accident: Secondary | ICD-10-CM | POA: Diagnosis not present

## 2020-10-22 DIAGNOSIS — E785 Hyperlipidemia, unspecified: Secondary | ICD-10-CM | POA: Diagnosis present

## 2020-10-22 DIAGNOSIS — N401 Enlarged prostate with lower urinary tract symptoms: Secondary | ICD-10-CM | POA: Diagnosis present

## 2020-10-22 DIAGNOSIS — E872 Acidosis: Secondary | ICD-10-CM | POA: Diagnosis present

## 2020-10-22 DIAGNOSIS — Z87891 Personal history of nicotine dependence: Secondary | ICD-10-CM | POA: Diagnosis not present

## 2020-10-22 DIAGNOSIS — R531 Weakness: Secondary | ICD-10-CM | POA: Diagnosis not present

## 2020-10-22 DIAGNOSIS — N12 Tubulo-interstitial nephritis, not specified as acute or chronic: Secondary | ICD-10-CM

## 2020-10-22 DIAGNOSIS — E1140 Type 2 diabetes mellitus with diabetic neuropathy, unspecified: Secondary | ICD-10-CM

## 2020-10-22 DIAGNOSIS — Z743 Need for continuous supervision: Secondary | ICD-10-CM | POA: Diagnosis not present

## 2020-10-22 DIAGNOSIS — I10 Essential (primary) hypertension: Secondary | ICD-10-CM | POA: Diagnosis not present

## 2020-10-22 DIAGNOSIS — R279 Unspecified lack of coordination: Secondary | ICD-10-CM | POA: Diagnosis not present

## 2020-10-22 DIAGNOSIS — J9811 Atelectasis: Secondary | ICD-10-CM | POA: Diagnosis not present

## 2020-10-22 DIAGNOSIS — M6281 Muscle weakness (generalized): Secondary | ICD-10-CM | POA: Diagnosis not present

## 2020-10-22 DIAGNOSIS — C9000 Multiple myeloma not having achieved remission: Secondary | ICD-10-CM | POA: Diagnosis present

## 2020-10-22 DIAGNOSIS — R1312 Dysphagia, oropharyngeal phase: Secondary | ICD-10-CM | POA: Diagnosis not present

## 2020-10-22 DIAGNOSIS — F1021 Alcohol dependence, in remission: Secondary | ICD-10-CM | POA: Diagnosis present

## 2020-10-22 LAB — COMPREHENSIVE METABOLIC PANEL
ALT: 14 U/L (ref 0–44)
AST: 32 U/L (ref 15–41)
Albumin: 2.6 g/dL — ABNORMAL LOW (ref 3.5–5.0)
Alkaline Phosphatase: 101 U/L (ref 38–126)
Anion gap: 12 (ref 5–15)
BUN: 30 mg/dL — ABNORMAL HIGH (ref 8–23)
CO2: 22 mmol/L (ref 22–32)
Calcium: 8.6 mg/dL — ABNORMAL LOW (ref 8.9–10.3)
Chloride: 99 mmol/L (ref 98–111)
Creatinine, Ser: 2.64 mg/dL — ABNORMAL HIGH (ref 0.61–1.24)
GFR, Estimated: 25 mL/min — ABNORMAL LOW (ref >60)
Glucose, Bld: 110 mg/dL — ABNORMAL HIGH (ref 70–99)
Potassium: 3.3 mmol/L — ABNORMAL LOW (ref 3.5–5.1)
Sodium: 133 mmol/L — ABNORMAL LOW (ref 135–145)
Total Bilirubin: 1 mg/dL (ref 0.3–1.2)
Total Protein: 8.5 g/dL — ABNORMAL HIGH (ref 6.5–8.1)

## 2020-10-22 LAB — CBC
HCT: 40.9 % (ref 39.0–52.0)
Hemoglobin: 13 g/dL (ref 13.0–17.0)
MCH: 27.3 pg (ref 26.0–34.0)
MCHC: 31.8 g/dL (ref 30.0–36.0)
MCV: 85.7 fL (ref 80.0–100.0)
Platelets: 229 10*3/uL (ref 150–400)
RBC: 4.77 MIL/uL (ref 4.22–5.81)
RDW: 14.8 % (ref 11.5–15.5)
WBC: 16 10*3/uL — ABNORMAL HIGH (ref 4.0–10.5)
nRBC: 0 % (ref 0.0–0.2)

## 2020-10-22 LAB — BRAIN NATRIURETIC PEPTIDE: B Natriuretic Peptide: 749.3 pg/mL — ABNORMAL HIGH (ref 0.0–100.0)

## 2020-10-22 LAB — HEMOGLOBIN A1C
Hgb A1c MFr Bld: 6.7 % — ABNORMAL HIGH (ref 4.8–5.6)
Mean Plasma Glucose: 145.59 mg/dL

## 2020-10-22 LAB — CBG MONITORING, ED: Glucose-Capillary: 107 mg/dL — ABNORMAL HIGH (ref 70–99)

## 2020-10-22 LAB — URINALYSIS, ROUTINE W REFLEX MICROSCOPIC
Bilirubin Urine: NEGATIVE
Glucose, UA: NEGATIVE mg/dL
Ketones, ur: NEGATIVE mg/dL
Nitrite: NEGATIVE
Protein, ur: >=300 mg/dL — AB
Specific Gravity, Urine: 1.012 (ref 1.005–1.030)
WBC, UA: >50 WBC/hpf — ABNORMAL HIGH (ref 0–5)
pH: 5 (ref 5.0–8.0)

## 2020-10-22 LAB — OSMOLALITY, URINE: Osmolality, Ur: 338 mOsm/kg (ref 300–900)

## 2020-10-22 LAB — PROCALCITONIN: Procalcitonin: 24.62 ng/mL

## 2020-10-22 LAB — SODIUM, URINE, RANDOM: Sodium, Ur: 38 mmol/L

## 2020-10-22 LAB — HM DIABETES EYE EXAM

## 2020-10-22 LAB — CREATININE, URINE, RANDOM: Creatinine, Urine: 120.02 mg/dL

## 2020-10-22 LAB — LACTIC ACID, PLASMA: Lactic Acid, Venous: 2.2 mmol/L (ref 0.5–1.9)

## 2020-10-22 MED ORDER — APIXABAN 5 MG PO TABS
5.0000 mg | ORAL_TABLET | Freq: Two times a day (BID) | ORAL | Status: DC
  Administered 2020-10-23 – 2020-10-26 (×8): 5 mg via ORAL
  Filled 2020-10-22 (×8): qty 1

## 2020-10-22 MED ORDER — DOCUSATE SODIUM 100 MG PO CAPS
100.0000 mg | ORAL_CAPSULE | Freq: Two times a day (BID) | ORAL | Status: DC
  Administered 2020-10-23 – 2020-10-24 (×2): 100 mg via ORAL
  Filled 2020-10-22 (×4): qty 1

## 2020-10-22 MED ORDER — ACETAMINOPHEN 325 MG PO TABS
650.0000 mg | ORAL_TABLET | Freq: Four times a day (QID) | ORAL | Status: DC | PRN
  Administered 2020-10-25 – 2020-10-26 (×2): 650 mg via ORAL
  Filled 2020-10-22 (×2): qty 2

## 2020-10-22 MED ORDER — ALBUTEROL SULFATE HFA 108 (90 BASE) MCG/ACT IN AERS
2.0000 | INHALATION_SPRAY | Freq: Four times a day (QID) | RESPIRATORY_TRACT | Status: DC | PRN
  Filled 2020-10-22: qty 6.7

## 2020-10-22 MED ORDER — PRAVASTATIN SODIUM 40 MG PO TABS
40.0000 mg | ORAL_TABLET | Freq: Every day | ORAL | Status: DC
  Administered 2020-10-23 – 2020-10-25 (×3): 40 mg via ORAL
  Filled 2020-10-22 (×3): qty 1

## 2020-10-22 MED ORDER — ACETAMINOPHEN 650 MG RE SUPP: 650.0000 mg | Freq: Four times a day (QID) | RECTAL | Status: DC | PRN

## 2020-10-22 MED ORDER — LORATADINE 10 MG PO TABS: 10.0000 mg | ORAL_TABLET | Freq: Every day | ORAL | Status: DC | PRN

## 2020-10-22 MED ORDER — SODIUM CHLORIDE 0.9 % IV SOLN: 1.0000 g | INTRAVENOUS | Status: DC

## 2020-10-22 MED ORDER — HYDROCODONE-ACETAMINOPHEN 5-325 MG PO TABS
1.0000 | ORAL_TABLET | ORAL | Status: DC | PRN
  Administered 2020-10-24: 2 via ORAL
  Filled 2020-10-22: qty 2
  Filled 2020-10-22: qty 1

## 2020-10-22 MED ORDER — INSULIN ASPART 100 UNIT/ML ~~LOC~~ SOLN
0.0000 [IU] | SUBCUTANEOUS | Status: DC
  Administered 2020-10-23 – 2020-10-25 (×6): 1 [IU] via SUBCUTANEOUS

## 2020-10-22 MED ORDER — SODIUM CHLORIDE 0.9 % IV SOLN
1.0000 g | Freq: Once | INTRAVENOUS | Status: AC
  Administered 2020-10-22: 1 g via INTRAVENOUS
  Filled 2020-10-22: qty 10

## 2020-10-22 MED ORDER — SENNA 8.6 MG PO TABS
1.0000 | ORAL_TABLET | Freq: Two times a day (BID) | ORAL | Status: DC
  Administered 2020-10-23 – 2020-10-24 (×2): 8.6 mg via ORAL
  Filled 2020-10-22 (×4): qty 1

## 2020-10-22 MED ORDER — METOPROLOL TARTRATE 50 MG PO TABS
50.0000 mg | ORAL_TABLET | Freq: Two times a day (BID) | ORAL | Status: DC
  Administered 2020-10-23 – 2020-10-26 (×8): 50 mg via ORAL
  Filled 2020-10-22 (×5): qty 1
  Filled 2020-10-22: qty 2
  Filled 2020-10-22 (×2): qty 1

## 2020-10-22 NOTE — Progress Notes (Signed)
Patient ID: [name], male   DOB: [birth year], 75 y.o.   MRN: [id number]        Chief Complaint: fall today       HPI:  [name] is a 75 y.o. male here with daughter, who lives with him and noted some AM disorientation and needed help putting on clothes first thing this am, then has become weaker during day, had a fall where he just sort of slid down to the knees without serious hardness or injury, but had to call son by phone to come help pick him up, as he had no strength.   This is on top of several days lower appetite, reduced po intake of fluids over baseline, and dark orange urine today.  Pt is O x 3 now, denies fever, chills and Pt denies chest pain, increased sob or doe, wheezing, orthopnea, PND, palpitations, or syncope. Does have chronic bilateral leg swelling he thinks is about the same. Does not check weights at home.  Last labs feb 2022 with improved CKD 3 after prior mild worsening.  Daughter just trying to get him help today, kind of hoping for a UTI, and pt very reluctant to go to ED.  Denies urinary symptoms such as dysuria, frequency, urgency, flank pain, hematuria or n/v, fever, chills.  Has been some recent concern for myeloma kidney.         Wt Readings from Last 3 Encounters:  08/23/20 270 lb 12.8 oz (122.8 kg)  03/29/20 249 lb 4.8 oz (113.1 kg)  03/27/20 238 lb 9.6 oz (108.2 kg)   BP Readings from Last 3 Encounters:  10/22/20 122/70  08/23/20 130/80  05/18/20 131/72         Past Medical History:  Diagnosis Date  . Adenoma 05/2008  . Alcoholism in recovery ([place])   . BPH (benign prostatic hyperplasia)   . Cancer [facility])    Prostate  . Chronic LBP    Hip & Back -- Sees Dr. Maia Petties @ [place]  . Colon polyps   . Complex renal cyst 12/2010  . Controlled type 2 diabetes mellitus with neuropathy ([place]) 01/2007  . COPD (chronic obstructive pulmonary disease) ([place])   . Diabetes ([place])   . Diverticulitis of colon   . ED (erectile  dysfunction)    s/p Penile prosthesis (09/2010)  . History of prostate cancer    Dr. Karsten Ro  . History of sick sinus syndrome    reduced BB dose for Bradycardia  . HLD (hyperlipidemia)   . Hypertension   . Impaired glucose tolerance 12/21/2013  . Nephrolithiasis   . Osteoarthritis of both hips   . Osteoarthritis of lumbosacral spine    with Disc Disease  . PAF (paroxysmal atrial fibrillation) ([facility])    No longer on Amiodarone.  Not on Anticoaguation b/c no recurrence.  . Paresthesias/numbness    Bilateral LE  . Peripheral neuropathy 12/21/2013   Past Surgical History:  Procedure Laterality Date  . LEFT HEART CATH AND CORONARY ANGIOGRAPHY  2003   Normal Coronary Arteries.  Marland Kitchen NM MYOVIEW LTD  02/21/2020   EF 60%.  Medium sized mild severity defect in the basal inferior, mid inferior and apical inferior location-suggesting of ischemia.  Read as low-intermediate risk.  (On cardiology review this is felt to be a fixed defect either related to prior infarct versus diaphragmatic attenuation.  Felt to be low risk)  . PENILE PROSTHESIS IMPLANT  06/21/2012   Procedure: PENILE PROTHESIS INFLATABLE;  Surgeon: Thana Farr  Karsten Ro, MD;  Location: [facility];  Service: Urology;  Laterality: N/A;  REMOVAL AND REPLACEMENT OF SOME  OF PROSTHESIS ([facility])   . PENILE PROSTHESIS PLACEMENT  09/2010  . PROSTATECTOMY    . REMOVAL OF PENILE PROSTHESIS N/A 02/15/2018   Procedure: REMOVAL OF PENILE PROSTHESIS;  Surgeon: Kathie Rhodes, MD;  Location: [facility];  Service: Urology;  Laterality: N/A;  . TRANSTHORACIC ECHOCARDIOGRAM  04/2016   a) Relatively normal EF of 55-60%. No RWMA suggesting no prior MI. Gr 2 DD (pseudo-normal filling pattern) along with moderately dilated left atrium.;; b) 02/2018: Mod LVH. EF 65-70%. No RWMA.  Unable to assess DF 2/2 Afib. Mild MR. Severe BiAtrial Enlargement. High CVP.  Marland Kitchen TRANSTHORACIC ECHOCARDIOGRAM  02/17/2020   EF 60 to 65%.  No R WMA.  Unable to assess diastolic parameters  because of A. fib.  Mildly elevated PA pressures.  Mild LA dilation.  Mild aortic valve sclerosis but no stenosis.  Normal IVC.    reports that he quit smoking about 37 years ago. His smoking use included cigarettes. He has never used smokeless tobacco. He reports current alcohol use. He reports that he does not use drugs. family history includes Coronary artery disease in his father and mother; Diabetes in his father; Heart attack in his mother; Prostate cancer in his father. Allergies  Allergen Reactions  . Ciprofloxacin Other (See Comments)    All over weakness   Current Outpatient Medications on File Prior to Visit  Medication Sig Dispense Refill  . acetaminophen (TYLENOL) 325 MG tablet Take 2 tablets (650 mg total) by mouth every 6 (six) hours as needed for mild pain or moderate pain.    Marland Kitchen amLODipine (NORVASC) 10 MG tablet Take 1 tablet (10 mg total) by mouth daily. 180 tablet 3  . amLODipine (NORVASC) 10 MG tablet Take 1 tablet (10 mg total) by mouth daily. 30 tablet 1  . apixaban (ELIQUIS) 5 MG TABS tablet Take 1 tablet (5 mg total) by mouth 2 (two) times daily. 60 tablet 0  . furosemide (LASIX) 40 MG tablet Take 1 tablet (40 mg total) by mouth daily. May take an additional 40 mg if need for swelling 120 tablet 3  . Lancets (ONETOUCH DELICA PLUS [id number]) MISC Apply 1 each topically daily.    Marland Kitchen loratadine (CLARITIN) 10 MG tablet Take 10 mg by mouth daily as needed for allergies.    . metoprolol tartrate (LOPRESSOR) 50 MG tablet Take 1 tablet (50 mg total) by mouth 2 (two) times daily. 180 tablet 3  . polyethylene glycol (MIRALAX / GLYCOLAX) 17 g packet Take 17 g by mouth daily as needed for moderate constipation. 7 each 0  . pravastatin (PRAVACHOL) 40 MG tablet Take 1 tablet (40 mg total) by mouth daily. 90 tablet 3  . PROAIR HFA 108 (90 Base) MCG/ACT inhaler INHALE 2 PUFFS INTO THE  LUNGS EVERY 6 HOURS AS  NEEDED 34 g 3  . FLUZONE HIGH-DOSE QUADRIVALENT 0.7 ML SUSY     . Lancet  Devices (ONETOUCH DELICA PLUS LANCING) MISC      No current facility-administered medications on file prior to visit.        ROS:  All others reviewed and negative.  Objective        PE:  BP 122/70 (BP Location: Left Arm, Patient Position: Sitting, Cuff Size: Large)   Pulse 75   Temp 98.3 F (36.8 C) (Oral)   Ht 6\' 1"  (1.854 m)   SpO2 92%  BMI 35.73 kg/m                 Constitutional: Pt appears in NAD               HENT: Head: NCAT.                Right Ear: External ear normal.                 Left Ear: External ear normal.                Eyes: . Pupils are equal, round, and reactive to light. Conjunctivae and EOM are normal               Nose: without d/c or deformity               Neck: Neck supple. Gross normal ROM               Cardiovascular: Normal rate and regular rhythm.                 Pulmonary/Chest: Effort normal and breath sounds decreased without rales or wheezing.                Abd:  Soft, NT, ND, + BS, no organomegaly               Neurological: Pt is alert. At baseline orientation, motor grossly intact               Skin: Skin is warm. No rashes, no other new lesions, LE edema - 2-3+ RLE with ? Small abrasion mid medial leg, LLE 1-2+               Psychiatric: Pt behavior is normal without agitation , but sad and dejected when confronted with need to go to ED  Micro: none  Cardiac tracings I have personally interpreted today:  none  Pertinent Radiological findings (summarize): none   Lab Results  Component Value Date   WBC 6.6 08/23/2020   HGB 13.3 08/23/2020   HCT 41.2 08/23/2020   PLT 250 08/23/2020   GLUCOSE 109 (H) 08/23/2020   CHOL 142 08/23/2020   TRIG 62 08/23/2020   HDL 41 08/23/2020   LDLCALC 88 08/23/2020   ALT 11 08/23/2020   AST 21 08/23/2020   NA 138 08/23/2020   K 4.4 08/23/2020   CL 102 08/23/2020   CREATININE 1.86 (H) 08/23/2020   BUN 22 08/23/2020   CO2 23 08/23/2020   TSH 1.07 10/06/2019   PSA 0.00 (L) 10/06/2019   INR  1.63 (H) 08/26/2010   HGBA1C 6.2 (H) 02/15/2020   MICROALBUR 1.8 10/06/2019   Assessment/Plan:  Aarav Asay is a 75 y.o. Black or African American [2] male with  has a past medical history of Adenoma (05/2008), Alcoholism in recovery [facility]), BPH (benign prostatic hyperplasia), Cancer ([place]), Chronic LBP, Colon polyps, Complex renal cyst (12/2010), Controlled type 2 diabetes mellitus with neuropathy ([place]) (01/2007), COPD (chronic obstructive pulmonary disease) ([place]), Diabetes ([place]), Diverticulitis of colon, ED (erectile dysfunction), History of prostate cancer, History of sick sinus syndrome, HLD (hyperlipidemia), Hypertension, Impaired glucose tolerance (12/21/2013), Nephrolithiasis, Osteoarthritis of both hips, Osteoarthritis of lumbosacral spine, PAF (paroxysmal atrial fibrillation) ([place]), Paresthesias/numbness, and Peripheral neuropathy (12/21/2013).  Fall No injury but high risk for recurrence, pt needs to be seen at ED  General weakness Etiology unclear, may have some degree of intravascular low volume?  I dont get the sense for infectious  etiology, but cannot be completely ruled out.  Pt referred to ED though he is highly reluctant to accept this  CKD (chronic kidney disease) stage 3, GFR 30-59 ml/min ([place]) Recently improved feb 2022, will need further f/u  Followup: Return if symptoms worsen or fail to improve.  Cathlean Cower, MD 10/22/2020 5:20 PM [facility]

## 2020-10-22 NOTE — H&P (Addendum)
[name] MCR:[id number] DOB: 19-Jan-1946 DOA: 10/22/2020    PCP: Biagio Borg, MD   Outpatient Specialists:   CARDS:   Dr. Ellyn Hack    Oncology  Dr. Burr Medico was supposed to follow up for work up of MM    Patient arrived to ER on 10/22/20 at 91 Referred by Attending Tegeler, Gwenyth Allegra, *   Patient coming from: home Lives   With family    Chief Complaint:  Chief Complaint  Patient presents with  . Fall  . Weakness    HPI: [name] is a 75 y.o. male with medical history significant of DM2, COPD, HLD, HTN,      Presented with fall from standing he apparently slid down to his knees.  Today no head trauma, has had decreased appetite and leg edema. Pt had kidney failure before presenting same way so family called PCP who referred them to ER  He has been extra confused lately No recent fever or chills no chest pain or shortness of breath No syncope In the past had similar presentation with UTI  noted urine dark and cloudy Family felt he has been off all Weekend  last time he was admitted with AKI MM was on differential  Has  been vaccinated against COVID    Initial COVID TEST   in house  PCR testing  Pending  Lab Results  Component Value Date   [place] NEGATIVE 02/15/2020     Regarding pertinent Chronic problems:     Hyperlipidemia -  on statinsPraachol  Lipid Panel     Component Value Date/Time   CHOL 142 08/23/2020 1504   TRIG 62 08/23/2020 1504   HDL 41 08/23/2020 1504   CHOLHDL 3.5 08/23/2020 1504   CHOLHDL 4 10/06/2019 1636   VLDL 13.8 10/06/2019 1636   LDLCALC 88 08/23/2020 1504   LABVLDL 13 08/23/2020 1504     HTN on Norvasc, lasix, lopressor   chronic CHF diastolic  - last echo July 2021 Preserved EF Left ventricular diastolic  parameters are indeterminate due to underlying atrial fibrillation.   Venous insufficiency with chronic leg edema      DM 2 -  Lab Results  Component Value Date   HGBA1C 6.2 (H) 02/15/2020     diet controlled      obesity-   BMI Readings from Last 1 Encounters:  10/22/20 35.73 kg/m      COPD -  not  on baseline oxygen        A. Fib -  - CHA2DS2 vas score   3       current  on anticoagulation with  Eliquis,           -  Rate control:  Currently controlled with  Metoprolol         - Rhythm control: did not tolerate  Amiodarone with lung toxicity,      CKD stage IIIb- baseline Cr 1.9 CrCl cannot be calculated (Unknown ideal weight.).  Lab Results  Component Value Date   CREATININE 2.64 (H) 10/22/2020   CREATININE 1.86 (H) 08/23/2020   CREATININE 1.64 (H) 03/29/2020        While in ER: Creatinine noted to be slightly above baseline Chest x-ray showing stable cardiomegaly UA worrisome for UTI Started on rocephin   CT abd showed possible diverticulitis vs enteritis Changed to zosyn ED Triage Vitals  Enc Vitals Group     BP 10/22/20 1812 (!) 153/73     Pulse Rate 10/22/20 1812  84     Resp 10/22/20 1812 20     Temp 10/22/20 1812 99.4 F (37.4 C)     Temp Source 10/22/20 1812 Oral     SpO2 10/22/20 1812 99 %     Weight --      Height --      Head Circumference --      Peak Flow --      Pain Score 10/22/20 1810 0     Pain Loc --      Pain Edu? --      Excl. in [place]? --   TMAX(24)@     _________________________________________ Significant initial  Findings: Abnormal Labs Reviewed  CBC - Abnormal; Notable for the following components:      Result Value   WBC 16.0 (*)    All other components within normal limits  URINALYSIS, ROUTINE W REFLEX MICROSCOPIC - Abnormal; Notable for the following components:   APPearance CLOUDY (*)    Hgb urine dipstick LARGE (*)    Protein, ur >=300 (*)    Leukocytes,Ua SMALL (*)    WBC, UA >50 (*)    Bacteria, UA MANY (*)    All other components within normal limits  BRAIN NATRIURETIC PEPTIDE - Abnormal; Notable for the following components:   B Natriuretic Peptide 749.3 (*)    All other components within normal limits   COMPREHENSIVE METABOLIC PANEL - Abnormal; Notable for the following components:   Sodium 133 (*)    Potassium 3.3 (*)    Glucose, Bld 110 (*)    BUN 30 (*)    Creatinine, Ser 2.64 (*)    Calcium 8.6 (*)    Total Protein 8.5 (*)    Albumin 2.6 (*)    GFR, Estimated 25 (*)    All other components within normal limits  CBG MONITORING, ED - Abnormal; Notable for the following components:   Glucose-Capillary 107 (*)    All other components within normal limits   ____________________________________________ Ordered CT HEAD  Old Left occipital infarct  CXR - cardiomegaly  CTabd/pelvis - diverticulitis/enteritis    _________________________   ECG: Ordered Personally reviewed by me showing: HR : 75 Rhythm A.fib.   Ischemic changes*nonspecific changes, no evidence of ischemic changes QTC 484 ____________________ This patient meets SIRS Criteria  The recent clinical data is shown below. Vitals:   10/22/20 1812 10/22/20 1915 10/22/20 2153 10/22/20 2155  BP: (!) 153/73 (!) 128/96 (!) 146/73   Pulse: 84 69 83 83  Resp: 20 (!) 31 (!) 29 (!) 39  Temp: 99.4 F (37.4 C)     TempSrc: Oral     SpO2: 99% 97% 97% 97%     WBC     Component Value Date/Time   WBC 16.0 (H) 10/22/2020 1812   LYMPHSABS 1.2 03/29/2020 0919   MONOABS 0.5 03/29/2020 0919   EOSABS 0.1 03/29/2020 0919   BASOSABS 0.0 03/29/2020 0919    Lactic Acid, Venous    Component Value Date/Time   LATICACIDVEN 2.2 (HH) 10/22/2020 2214    Procalcitonin 24 Lactic Acid, Venous    Component Value Date/Time   LATICACIDVEN 1.3 10/23/2020 0010        UA  evidence of UTI    Urine analysis:    Component Value Date/Time   COLORURINE YELLOW 10/22/2020 2113   APPEARANCEUR CLOUDY (A) 10/22/2020 2113   LABSPEC 1.012 10/22/2020 2113   PHURINE 5.0 10/22/2020 2113   GLUCOSEU NEGATIVE 10/22/2020 2113   GLUCOSEU NEGATIVE 10/06/2019 1636  HGBUR LARGE (A) 10/22/2020 2113   BILIRUBINUR NEGATIVE 10/22/2020 2113    KETONESUR NEGATIVE 10/22/2020 2113   PROTEINUR >=300 (A) 10/22/2020 2113   UROBILINOGEN 0.2 10/06/2019 1636   NITRITE NEGATIVE 10/22/2020 2113   LEUKOCYTESUR SMALL (A) 10/22/2020 2113    _______________________________________________________   _______________________________________________ Hospitalist was called for admission for Sepsis due to UTI and diverticultis  The following Work up has been ordered so far:  Orders Placed This Encounter  Procedures  . CT Head Wo Contrast  . CT Cervical Spine Wo Contrast  . DG Chest Portable 1 View  . CBC  . Urinalysis, Routine w reflex microscopic  . Brain natriuretic peptide  . Comprehensive metabolic panel  . Document Height and Actual Weight  . Consult to hospitalist  [phone]  . CBG monitoring, ED  . ED EKG     Following Medications were ordered in ER: Medications  cefTRIAXone (ROCEPHIN) 1 g in sodium chloride 0.9 % 100 mL IVPB (1 g Intravenous New Bag/Given 10/22/20 2155)        Consult Orders  (From admission, onward)         Start     Ordered   10/22/20 2148  Consult to hospitalist  [phone] Paged, Pollie Friar  Once       Comments: [phone]  Provider:  (Not yet assigned)  Question Answer Comment  Place call to: [facility]   Reason for Consult Admit      10/22/20 2148          OTHER Significant initial  Findings:  labs showing:    Recent Labs  Lab 10/22/20 1822  NA 133*  K 3.3*  CO2 22  GLUCOSE 110*  BUN 30*  CREATININE 2.64*  CALCIUM 8.6*    Cr    Up from baseline see below Lab Results  Component Value Date   CREATININE 2.64 (H) 10/22/2020   CREATININE 1.86 (H) 08/23/2020   CREATININE 1.64 (H) 03/29/2020    Recent Labs  Lab 10/22/20 1822  AST 32  ALT 14  ALKPHOS 101  BILITOT 1.0  PROT 8.5*  ALBUMIN 2.6*   Lab Results  Component Value Date   CALCIUM 8.6 (L) 10/22/2020   PHOS 2.9 02/23/2020          Plt: Lab Results  Component Value Date   PLT 229 10/22/2020     Venous   Blood Gas result:  pH 7.426  pCO2 35.7        Recent Labs  Lab 10/22/20 1812  WBC 16.0*  HGB 13.0  HCT 40.9  MCV 85.7  PLT 229    HG/HCT   Stable     Component Value Date/Time   HGB 13.0 10/22/2020 1812   HGB 13.3 08/23/2020 1504   HCT 40.9 10/22/2020 1812   HCT 41.2 08/23/2020 1504   MCV 85.7 10/22/2020 1812   MCV 84 08/23/2020 1504       Cardiac Panel (last 3 results) No results for input(s): CKTOTAL, CKMB, TROPONINI, RELINDX in the last 72 hours.   BNP (last 3 results) Recent Labs    08/23/20 1504 10/22/20 1813  BNP 377.8* 749.3*     DM  labs:  HbA1C: Recent Labs    02/15/20 2250  HGBA1C 6.2*       CBG (last 3)  Recent Labs    10/22/20 1941  GLUCAP 107*   Cultures:    Component Value Date/Time   SDES  02/15/2018 1020    WOUND INFECTED PROSTHESIS Performed  at [facility], [place] [address], [place], [place] [address]    SPECREQUEST  02/15/2018 1020    NONE Performed at [facility], [place] [address], [place], [place] [address]    CULT  02/15/2018 1020    NO GROWTH 2 DAYS Performed at [facility] [address], [place],  [address]    REPTSTATUS 02/17/2018 FINAL 02/15/2018 1020     Radiological Exams on Admission: CT Head Wo Contrast  Result Date: 10/22/2020 CLINICAL DATA:  Fall EXAM: CT HEAD WITHOUT CONTRAST TECHNIQUE: Contiguous axial images were obtained from the base of the skull through the vertex without intravenous contrast. COMPARISON:  02/15/2020 FINDINGS: Brain: Old left occipital infarct, stable. There is atrophy and chronic small vessel disease changes. No acute intracranial abnormality. Specifically, no hemorrhage, hydrocephalus, mass lesion, acute infarction, or significant intracranial injury. Vascular: No hyperdense vessel or unexpected calcification. Skull: No acute calvarial abnormality. Sinuses/Orbits: No acute findings. Mucous retention cyst in the right maxillary sinus. Other:  None IMPRESSION: Atrophy, chronic microvascular disease. No acute intracranial abnormality. Old left occipital infarct. Electronically Signed   By: Rolm Baptise M.D.   On: 10/22/2020 21:21   CT Cervical Spine Wo Contrast  Result Date: 10/22/2020 CLINICAL DATA:  Fall EXAM: CT CERVICAL SPINE WITHOUT CONTRAST TECHNIQUE: Multidetector CT imaging of the cervical spine was performed without intravenous contrast. Multiplanar CT image reconstructions were also generated. COMPARISON:  No subluxation. FINDINGS: Alignment: No subluxation. Skull base and vertebrae: No acute fracture. No primary bone lesion or focal pathologic process. Soft tissues and spinal canal: No prevertebral fluid or swelling. No visible canal hematoma. Disc levels: Diffuse advanced degenerative disc disease and facet disease. Upper chest: No acute findings Other: None IMPRESSION: Diffuse advanced spondylosis.  No acute bony abnormality. Electronically Signed   By: Rolm Baptise M.D.   On: 10/22/2020 21:25   DG Chest Portable 1 View  Result Date: 10/22/2020 CLINICAL DATA:  75 year old male with shortness of breath. EXAM: PORTABLE CHEST 1 VIEW COMPARISON:  Chest radiograph dated 02/18/2020 FINDINGS: Minimal left lung base atelectasis. No focal consolidation, pleural effusion, pneumothorax. Stable cardiomegaly. Atherosclerotic calcification of the aorta. No acute osseous pathology. IMPRESSION: 1. No acute cardiopulmonary process. 2. Stable cardiomegaly. Electronically Signed   By: Anner Crete M.D.   On: 10/22/2020 19:36   _______________________________________________________________________________________________________ Latest  Blood pressure (!) 146/73, pulse 83, temperature 99.4 F (37.4 C), temperature source Oral, resp. rate (!) 39, SpO2 97 %.   Review of Systems:    Pertinent positives include:   chills, fatigue  dysuria,   Constitutional:  No weight loss, night sweats, Fevers, weight loss  HEENT:  No headaches, Difficulty  swallowing,Tooth/dental problems,Sore throat,  No sneezing, itching, ear ache, nasal congestion, post nasal drip,  Cardio-vascular:  No chest pain, Orthopnea, PND, anasarca, dizziness, palpitations.no Bilateral lower extremity swelling  GI:  No heartburn, indigestion, abdominal pain, nausea, vomiting, diarrhea, change in bowel habits, loss of appetite, melena, blood in stool, hematemesis Resp:  no shortness of breath at rest. No dyspnea on exertion, No excess mucus, no productive cough, No non-productive cough, No coughing up of blood.No change in color of mucus.No wheezing. Skin:  no rash or lesions. No jaundice GU:  nochange in color of urine, no urgency or frequency. No straining to urinate.  No flank pain.  Musculoskeletal:  No joint pain or no joint swelling. No decreased range of motion. No back pain.  Psych:  No change in mood or affect. No depression  or anxiety. No memory loss.  Neuro: no localizing neurological complaints, no tingling, no weakness, no double vision, no gait abnormality, no slurred speech, no confusion  All systems reviewed and apart from [place] all are negative _______________________________________________________________________________________________ Past Medical History:   Past Medical History:  Diagnosis Date  . Adenoma 05/2008  . Alcoholism in recovery ([place])   . BPH (benign prostatic hyperplasia)   . Cancer [facility])    Prostate  . Chronic LBP    Hip & Back -- Sees Dr. Maia Petties @ [place]  . Colon polyps   . Complex renal cyst 12/2010  . Controlled type 2 diabetes mellitus with neuropathy ([place]) 01/2007  . COPD (chronic obstructive pulmonary disease) ([place])   . Diabetes ([place])   . Diverticulitis of colon   . ED (erectile dysfunction)    s/p Penile prosthesis (09/2010)  . History of prostate cancer    Dr. Karsten Ro  . History of sick sinus syndrome    reduced BB dose for Bradycardia  . HLD (hyperlipidemia)   . Hypertension   . Impaired  glucose tolerance 12/21/2013  . Nephrolithiasis   . Osteoarthritis of both hips   . Osteoarthritis of lumbosacral spine    with Disc Disease  . PAF (paroxysmal atrial fibrillation) (HCC)    No longer on Amiodarone.  Not on Anticoaguation b/c no recurrence.  . Paresthesias/numbness    Bilateral LE  . Peripheral neuropathy 12/21/2013      Past Surgical History:  Procedure Laterality Date  . LEFT HEART CATH AND CORONARY ANGIOGRAPHY  2003   Normal Coronary Arteries.  Marland Kitchen NM MYOVIEW LTD  02/21/2020   EF 60%.  Medium sized mild severity defect in the basal inferior, mid inferior and apical inferior location-suggesting of ischemia.  Read as low-intermediate risk.  (On cardiology review this is felt to be a fixed defect either related to prior infarct versus diaphragmatic attenuation.  Felt to be low risk)  . PENILE PROSTHESIS IMPLANT  06/21/2012   Procedure: PENILE PROTHESIS INFLATABLE;  Surgeon: Claybon Jabs, MD;  [facility]: [facility];  Service: Urology;  Laterality: N/A;  REMOVAL AND REPLACEMENT OF SOME  OF PROSTHESIS ([facility])   . PENILE PROSTHESIS PLACEMENT  09/2010  . PROSTATECTOMY    . REMOVAL OF PENILE PROSTHESIS N/A 02/15/2018   Procedure: REMOVAL OF PENILE PROSTHESIS;  Surgeon: Kathie Rhodes, MD;  Location: [facility];  Service: Urology;  Laterality: N/A;  . TRANSTHORACIC ECHOCARDIOGRAM  04/2016   a) Relatively normal EF of 55-60%. No RWMA suggesting no prior MI. Gr 2 DD (pseudo-normal filling pattern) along with moderately dilated left atrium.;; b) 02/2018: Mod LVH. EF 65-70%. No RWMA.  Unable to assess DF 2/2 Afib. Mild MR. Severe BiAtrial Enlargement. High CVP.  Marland Kitchen TRANSTHORACIC ECHOCARDIOGRAM  02/17/2020   EF 60 to 65%.  No R WMA.  Unable to assess diastolic parameters because of A. fib.  Mildly elevated PA pressures.  Mild LA dilation.  Mild aortic valve sclerosis but no stenosis.  Normal IVC.    Social History:  Ambulatory   walker       reports that he quit smoking about  37 years ago. His smoking use included cigarettes. He has never used smokeless tobacco. He reports current alcohol use. He reports that he does not use drugs.     Family History:   Family History  Problem Relation Age of Onset  . Coronary artery disease Mother   . Heart attack Mother   . Coronary  artery disease Father   . Prostate cancer Father   . Diabetes Father    ______________________________________________________________________________________________ Allergies: Allergies  Allergen Reactions  . Ciprofloxacin Other (See Comments)    All over weakness     Prior to Admission medications   Medication Sig Start Date End Date Taking? Authorizing Provider  acetaminophen (TYLENOL) 325 MG tablet Take 2 tablets (650 mg total) by mouth every 6 (six) hours as needed for mild pain or moderate pain. 02/23/20   Domenic Polite, MD  amLODipine (NORVASC) 10 MG tablet Take 1 tablet (10 mg total) by mouth daily. 08/23/20 11/21/20  Leonie Man, MD  amLODipine (NORVASC) 10 MG tablet Take 1 tablet (10 mg total) by mouth daily. 08/23/20 11/21/20  Leonie Man, MD  apixaban (ELIQUIS) 5 MG TABS tablet Take 1 tablet (5 mg total) by mouth 2 (two) times daily. 03/23/20   Biagio Borg, MD  FLUZONE HIGH-DOSE QUADRIVALENT 0.7 ML SUSY  05/15/20   [provider]  furosemide (LASIX) 40 MG tablet Take 1 tablet (40 mg total) by mouth daily. May take an additional 40 mg if need for swelling 08/23/20 11/21/20  Leonie Man, MD  Lancet Devices [facility] DELICA PLUS LANCING) [place]  09/19/20   [provider]  Lancets [facility] DELICA PLUS [id number]) [place] 1 each topically daily. 08/28/20   [provider]  loratadine (CLARITIN) 10 MG tablet Take 10 mg by mouth daily as needed for allergies.    [provider]  metoprolol tartrate (LOPRESSOR) 50 MG tablet Take 1 tablet (50 mg total) by mouth 2 (two) times daily. 03/05/20   Biagio Borg, MD  polyethylene glycol (MIRALAX /  GLYCOLAX) 17 g packet Take 17 g by mouth daily as needed for moderate constipation. 02/23/20   Domenic Polite, MD  pravastatin (PRAVACHOL) 40 MG tablet Take 1 tablet (40 mg total) by mouth daily. 06/26/20   Leonie Man, MD  PROAIR HFA 108 [phone] Base) MCG/ACT inhaler INHALE 2 PUFFS INTO THE  LUNGS EVERY 6 HOURS AS  NEEDED 06/01/20   Biagio Borg, MD    ___________________________________________________________________________________________________ Physical Exam: Vitals with BMI 10/22/2020 10/22/2020 10/22/2020  Height - - -  Weight - - -  BMI - - -  Systolic - 373 428  Diastolic - 73 96  Pulse 83 83 69     1. General:  in No  Acute distress RR in 30's  Chronically ill  -appearing 2. Psychological: Alert and  Oriented 3. Head/ENT:   Moist Mucous Membranes                          Head Non traumatic, neck supple                         Poor Dentition 4. SKIN: normal  kin turgor,  Skin clean Dry and abrasion/skin tear over right leg 5. Heart: Regular rate and rhythm systolic  Murmur, no Rub or gallop 6. Lungs:  no wheezes or crackles   7. Abdomen: Soft, non-tender,   distended  obese diminished bowel sounds present 8. Lower extremities: no clubbing, cyanosis, right >leftedema 9. Neurologically Grossly intact, moving all 4 extremities equally   10. MSK: Normal range of motion    Chart has been reviewed  ______________________________________________________________________________________________  Assessment/Plan   75 y.o. male with medical history significant of DM2, COPD, HLD, HTN,       Admitted for  UTI  Present on Admission: . Sepsis ([place]) -  -SIRS criteria met with  elevated white blood cell count,       Component Value Date/Time   WBC 16.0 (H) 10/22/2020 1812   LYMPHSABS 1.2 03/29/2020 0919     RR >20 Today's Vitals   10/22/20 2215 10/23/20 0000 10/23/20 0045 10/23/20 0100  BP: (!) 152/78 (!) 158/80 (!) 156/74 (!) 146/71  Pulse: 84 81 68 68  Resp: (!) 21 (!) 27  (!) 26 (!) 29  Temp:      TempSrc:      SpO2: 94% 96% 95% 97%  PainSc:       There is no height or weight on file to calculate BMI.  This patient meets SIRS      -Most likely source being:  Urinary,  intra-abdominal,  C    Patient meeting criteria for Severe sepsis with    evidence of end organ damage/organ dysfunction such as   Acute Kidney Injury with Cr > 2,  Lab Results  Component Value Date   CREATININE 2.64 (H) 10/22/2020   CREATININE 1.86 (H) 08/23/2020    elevated lactic acid >2      - Obtain serial lactic acid and procalcitonin level.  - Initiated IV antibiotics   - await results of blood and urine culture  - Rehydrate        1:25 AM   . Diverticulitis bowel rest and cover with Zosyn for now  UTI -cover with Zosyn await results of urine culture  . Venous insufficiency (chronic) (peripheral) with chronic lower extremity edema chronic worse on the right than the left wound care consult Given significant leg edema order Dopplers  . Permanent atrial fibrillation (HCC) - now off amiodarone; asymptomatic. CHA2DS2-VASc Score -5 continue anticoagulation currently rate controlled    . Fall PT OT assessment prior to discharge  . Diabetes mellitus with neuropathy (HCC) -  - Order Sensitive  SSI    -  check TSH and HgA1C     . COPD (chronic obstructive pulmonary disease) ([place]) chronic continue home medication Consult    . CKD (chronic kidney disease) stage 3, GFR 30-59 ml/min (HCC)-  -chronic avoid nephrotoxic medications such as NSAIDs, Vanco Zosyn combo,  avoid hypotension, continue to follow renal function  . AKI (acute kidney injury) ([place]) gentle fluids and follow renal function obtain urine electrolytes  Hypokalemia will replace check magnesium level   Other plan as per orders.  DVT prophylaxis:    Eliquis     Code Status:    Code Status: Prior FULL CODE care as per patient   I had personally discussed CODE STATUS with patient       Family  Communication:   Family  at  Bedside  plan of care was discussed    with   Daughter,   Disposition Plan:      To home once workup is complete and patient is stable   Following barriers for discharge:                            Electrolytes corrected                              white count improving able to transition to PO antibiotics  Will need to be able to tolerate PO                                 Would benefit from PT/OT eval prior to DC  Ordered                                     Consults called: none    Admission status:  ED Disposition    ED Disposition Condition [place]: [place] [100100]  Level of Care: Telemetry Medical [104]  I expect the patient will be discharged within 24 hours: No (not a candidate for 5C-Observation unit)  Covid Evaluation: Asymptomatic Screening Protocol (No Symptoms)  Diagnosis: Sepsis [facility]) [[id number]]  Admitting Physician: Toy Baker [[id number]]  Attending Physician: Toy Baker [[id number]]      Obs  Level of care     tele  For   24H       Precautions: admitted as asymptomatic screening protocol    PPE: Used by the provider:   N95  eye Goggles,  Gloves      Kikuye Korenek 10/23/2020, 1:30 AM    [facility]     after 2 AM please page floor coverage PA If 7AM-7PM, please contact the day team taking care of the patient using [email or web address]   Patient was evaluated in the context of the global COVID-19 pandemic, which necessitated consideration that the patient might be at risk for infection with the SARS-CoV-2 virus that causes COVID-19. Institutional protocols and algorithms that pertain to the evaluation of patients at risk for COVID-19 are in a state of rapid change based on information released by regulatory bodies including the CDC and federal and state organizations. These policies and algorithms were followed during the patient's care.

## 2020-10-22 NOTE — Assessment & Plan Note (Addendum)
Etiology unclear, may have some degree of intravascular low volume?  I dont get the sense for infectious etiology, but cannot be completely ruled out.  Pt referred to ED though he is highly reluctant to accept this

## 2020-10-22 NOTE — ED Triage Notes (Signed)
Emergency Medicine Provider Triage Evaluation Note  [name] , a 75 y.o. male  was evaluated in triage.  Pt complains of multiple things.  He had a fall from standing witnessed by grandson, patien with apixaban.  T is adamant that he did not strike his head, however he is anticoagulated.  He has also had swelling in his bilateral lower extremities and reports generally feeling unwell.    Physical Exam  BP (!) 153/73 (BP Location: Right Arm)   Pulse 84   Temp 99.4 F (37.4 C) (Oral)   Resp 20   SpO2 99%   Patient is awake and alert.  Answers questions appropriately.  Speech is not slurred.  He has edema in bilateral lower extremities, appears primarily symmetric.  Medical Decision Making  Medically screening exam initiated at 6:22 PM.  Appropriate orders placed.  [name] was informed that the remainder of the evaluation will be completed by another provider, this initial triage assessment does not replace that evaluation, and the importance of remaining in the ED until their evaluation is complete.  As patient denies striking his head, while he is on Eliquis he does not appear to meet level 2 trauma criteria since he states he did not hit his head.  Clinical Impression  Fall without loss of consciousness, anticoagulant use, feeling unwell.   Lorin Glass, [place] 10/22/20 1827

## 2020-10-22 NOTE — Assessment & Plan Note (Signed)
Recently improved feb 2022, will need further f/u

## 2020-10-22 NOTE — ED Triage Notes (Signed)
Pt sent from PCP for further eval of multiple things-pt had fall from standing witnessed by grandson; however, pt does not remember fall. Pt has decreased appetite over the weekend, swelling in BLE. Daughter states pt has been admitted for similar presentation with kidney failure before, so was advised by PCP to come to ED.

## 2020-10-22 NOTE — ED Provider Notes (Signed)
[facility] EMERGENCY DEPARTMENT Provider Note   CSN: [id number] Arrival date & time: 10/22/20  1759     History Chief Complaint  Patient presents with  . Fall  . Weakness    [name] is a 75 y.o. male.  HPI  Patient is a 20 YOM with a history of DM, HTN, HLD presenting with a chief complaint of AMS over the past week. He has had dark urine and leg weakness developing over the past 3 days. Most concerning, patient also with BLLE swelling over this week. They deny fevers or chills, nausea or vomiting, syncope or SOB. Patient has been ambulatory and well appearing at home until this event this week. Patient's daughter who escorts the patient to the ED says that he has had a similar episode in the past related to an infection.     Past Medical History:  Diagnosis Date  . Adenoma 05/2008  . Alcoholism in recovery ([place])   . BPH (benign prostatic hyperplasia)   . Cancer [facility])    Prostate  . Chronic LBP    Hip & Back -- Sees Dr. Maia Petties @ [place]  . Colon polyps   . Complex renal cyst 12/2010  . Controlled type 2 diabetes mellitus with neuropathy ([place]) 01/2007  . COPD (chronic obstructive pulmonary disease) ([place])   . Diabetes ([place])   . Diverticulitis of colon   . ED (erectile dysfunction)    s/p Penile prosthesis (09/2010)  . History of prostate cancer    Dr. Karsten Ro  . History of sick sinus syndrome    reduced BB dose for Bradycardia  . HLD (hyperlipidemia)   . Hypertension   . Impaired glucose tolerance 12/21/2013  . Nephrolithiasis   . Osteoarthritis of both hips   . Osteoarthritis of lumbosacral spine    with Disc Disease  . PAF (paroxysmal atrial fibrillation) (HCC)    No longer on Amiodarone.  Not on Anticoaguation b/c no recurrence.  . Paresthesias/numbness    Bilateral LE  . Peripheral neuropathy 12/21/2013    Patient Active Problem List   Diagnosis Date Noted  . Fall 10/22/2020  . Sepsis ([place]) 10/22/2020  . Acute  lower UTI 10/22/2020  . NSVT (nonsustained ventricular tachycardia) ([place])   . Multiple myeloma ([place])   . AKI (acute kidney injury) ([place]) 02/15/2020  . General weakness   . Diabetic leg ulcer ([place]) 10/06/2019  . CKD (chronic kidney disease) stage 3, GFR 30-59 ml/min (HCC) 10/06/2019  . Diabetic ulcer of right lower leg ([place]) 02/23/2019  . Abnormal LFTs 04/27/2018  . Open wound of scrotum 03/01/2018  . Acute on chronic diastolic CHF (congestive heart failure) ([place]) 02/25/2018  . Postoperative anemia due to acute blood loss 02/25/2018  . Occipital stroke ([place]), left 09/23/2017  . Diabetes mellitus type II, non insulin dependent ([place])   . Encounter for well adult exam with abnormal findings 07/31/2017  . Venous insufficiency (chronic) (peripheral) with chronic lower extremity edema 04/04/2016  . Junctional tachycardia ([place]) 03/18/2015  . Exertional dyspnea 07/05/2014  . Obesity (BMI 30-39.9) 05/04/2014  . Benign essential tremor 05/04/2014  . Peripheral neuropathy 12/21/2013  . Nonspecific abnormal finding in stool contents 09/06/2012  . Heme positive stool 08/09/2012  . Difficulty passing stool 08/09/2012  . Sebaceous cyst 04/15/2012  . Complex renal cyst 01/13/2011  . Back pain 01/02/2011  . Permanent atrial fibrillation (HCC) - now off amiodarone; asymptomatic. CHA2DS2-VASc Score -5 (on Xarelto) 01/02/2011  . KNEE PAIN, LEFT 08/27/2009  .  COLONIC POLYPS, HX OF 08/08/2008  . ERECTILE DYSFUNCTION 09/27/2007  . ULNAR NEUROPATHY 09/27/2007  . VENTRICULAR HYPERTROPHY, LEFT 09/27/2007  . ALLERGIC RHINITIS 09/27/2007  . Arthropathy 09/27/2007  . GANGLION CYST, WRIST, LEFT 09/27/2007  . HEMORRHOIDS, INTERNAL 06/10/2007  . DIVERTICULOSIS, COLON 06/10/2007  . [place] DISEASE, LUMBAR 06/03/2007  . NEPHROLITHIASIS, HX OF 06/03/2007  . Diabetes mellitus with neuropathy ([place]) 02/11/2007  . Hyperlipidemia with target LDL less than 100 02/11/2007  . ANXIETY 02/11/2007  . Nondependent Alcohol  Abuse, in Remission 02/11/2007  . Hypertensive heart disease with chronic diastolic congestive heart failure ([place]) 02/11/2007  . COPD (chronic obstructive pulmonary disease) ([place]) 02/11/2007  . BENIGN PROSTATIC HYPERTROPHY 02/11/2007  . LOW BACK PAIN 02/11/2007  . Disturbance of skin sensation 02/11/2007  . PROSTATE CANCER, HX OF 02/11/2007    Past Surgical History:  Procedure Laterality Date  . LEFT HEART CATH AND CORONARY ANGIOGRAPHY  2003   Normal Coronary Arteries.  Marland Kitchen NM MYOVIEW LTD  02/21/2020   EF 60%.  Medium sized mild severity defect in the basal inferior, mid inferior and apical inferior location-suggesting of ischemia.  Read as low-intermediate risk.  (On cardiology review this is felt to be a fixed defect either related to prior infarct versus diaphragmatic attenuation.  Felt to be low risk)  . PENILE PROSTHESIS IMPLANT  06/21/2012   Procedure: PENILE PROTHESIS INFLATABLE;  Surgeon: Claybon Jabs, MD;  Location: [facility];  Service: Urology;  Laterality: N/A;  REMOVAL AND REPLACEMENT OF SOME  OF PROSTHESIS (AMS)   . PENILE PROSTHESIS PLACEMENT  09/2010  . PROSTATECTOMY    . REMOVAL OF PENILE PROSTHESIS N/A 02/15/2018   Procedure: REMOVAL OF PENILE PROSTHESIS;  Surgeon: Kathie Rhodes, MD;  Location: [facility];  Service: Urology;  Laterality: N/A;  . TRANSTHORACIC ECHOCARDIOGRAM  04/2016   a) Relatively normal EF of 55-60%. No RWMA suggesting no prior MI. Gr 2 DD (pseudo-normal filling pattern) along with moderately dilated left atrium.;; b) 02/2018: Mod LVH. EF 65-70%. No RWMA.  Unable to assess DF 2/2 Afib. Mild MR. Severe BiAtrial Enlargement. High CVP.  Marland Kitchen TRANSTHORACIC ECHOCARDIOGRAM  02/17/2020   EF 60 to 65%.  No R WMA.  Unable to assess diastolic parameters because of A. fib.  Mildly elevated PA pressures.  Mild LA dilation.  Mild aortic valve sclerosis but no stenosis.  Normal IVC.       Family History  Problem Relation Age of Onset  . Coronary artery  disease Mother   . Heart attack Mother   . Coronary artery disease Father   . Prostate cancer Father   . Diabetes Father     Social History   Tobacco Use  . Smoking status: Former Smoker    Types: Cigarettes    Quit date: 11/04/1982    Years since quitting: 37.9  . Smokeless tobacco: Never Used  Vaping Use  . Vaping Use: Never used  Substance Use Topics  . Alcohol use: Yes    Comment: no alcohol x 1 week wants to stop  . Drug use: No    Types: Marijuana    Comment: use to smoke marijuana    Home Medications Prior to Admission medications   Medication Sig Start Date End Date Taking? Authorizing Provider  acetaminophen (TYLENOL) 325 MG tablet Take 2 tablets (650 mg total) by mouth every 6 (six) hours as needed for mild pain or moderate pain. 02/23/20  Yes Domenic Polite, MD  amLODipine (NORVASC) 10 MG tablet Take 1  tablet (10 mg total) by mouth daily. 08/23/20 11/21/20 Yes Leonie Man, MD  apixaban (ELIQUIS) 5 MG TABS tablet Take 1 tablet (5 mg total) by mouth 2 (two) times daily. 03/23/20  Yes Biagio Borg, MD  furosemide (LASIX) 40 MG tablet Take 1 tablet (40 mg total) by mouth daily. May take an additional 40 mg if need for swelling Patient taking differently: Take 40 mg by mouth See admin instructions. 80mg  Sunday,Monday,Tuesday. 40mg  Wednesday,Thursday, Friday and saturday 08/23/20 11/21/20 Yes Leonie Man, MD  Lancet Devices [facility] DELICA PLUS LANCING) [place]  09/19/20  Yes [provider]  Lancets [facility] DELICA PLUS [id number]) MISC Apply 1 each topically daily. 08/28/20  Yes [provider]  loratadine (CLARITIN) 10 MG tablet Take 10 mg by mouth daily as needed for allergies.   Yes [provider]  metoprolol tartrate (LOPRESSOR) 50 MG tablet Take 1 tablet (50 mg total) by mouth 2 (two) times daily. 03/05/20  Yes Biagio Borg, MD  polyethylene glycol (MIRALAX / GLYCOLAX) 17 g packet Take 17 g by mouth daily as needed for moderate constipation.  02/23/20  Yes Domenic Polite, MD  pravastatin (PRAVACHOL) 40 MG tablet Take 1 tablet (40 mg total) by mouth daily. 06/26/20  Yes Leonie Man, MD  PROAIR HFA 108 [phone] Base) MCG/ACT inhaler INHALE 2 PUFFS INTO THE  LUNGS EVERY 6 HOURS AS  NEEDED Patient taking differently: Inhale 2 puffs into the lungs every 6 (six) hours as needed for shortness of breath or wheezing. 06/01/20  Yes Biagio Borg, MD    Allergies    Ciprofloxacin  Review of Systems   Review of Systems  Constitutional: Negative for chills and fever.  HENT: Negative for ear pain and sore throat.   Eyes: Negative for pain and visual disturbance.  Respiratory: Negative for cough and shortness of breath.   Cardiovascular: Negative for chest pain and palpitations.  Gastrointestinal: Negative for abdominal pain and vomiting.  Genitourinary: Positive for dysuria. Negative for hematuria.  Musculoskeletal: Negative for arthralgias and back pain.  Skin: Negative for color change and rash.  Neurological: Positive for weakness. Negative for seizures and syncope.  All other systems reviewed and are negative.   Physical Exam Updated Vital Signs BP (!) 158/80   Pulse 81   Temp 99.4 F (37.4 C) (Oral)   Resp (!) 27   SpO2 96%   Physical Exam Vitals and nursing note reviewed.  Constitutional:      Appearance: He is well-developed.  HENT:     Head: Normocephalic and atraumatic.  Eyes:     Conjunctiva/sclera: Conjunctivae normal.  Cardiovascular:     Rate and Rhythm: Normal rate and regular rhythm.     Heart sounds: No murmur heard.   Pulmonary:     Effort: Pulmonary effort is normal. No respiratory distress.     Breath sounds: Normal breath sounds.  Abdominal:     Palpations: Abdomen is soft.     Tenderness: There is no abdominal tenderness.  Musculoskeletal:     Cervical back: Neck supple.  Skin:    General: Skin is warm and dry.  Neurological:     Mental Status: He is alert.     ED Results / Procedures /  Treatments   Labs (all labs ordered are listed, but only abnormal results are displayed) Labs Reviewed  CBC - Abnormal; Notable for the following components:      Result Value   WBC 16.0 (*)    All  other components within normal limits  URINALYSIS, ROUTINE W REFLEX MICROSCOPIC - Abnormal; Notable for the following components:   APPearance CLOUDY (*)    Hgb urine dipstick LARGE (*)    Protein, ur >=300 (*)    Leukocytes,Ua SMALL (*)    WBC, UA >50 (*)    Bacteria, UA MANY (*)    All other components within normal limits  BRAIN NATRIURETIC PEPTIDE - Abnormal; Notable for the following components:   B Natriuretic Peptide 749.3 (*)    All other components within normal limits  COMPREHENSIVE METABOLIC PANEL - Abnormal; Notable for the following components:   Sodium 133 (*)    Potassium 3.3 (*)    Glucose, Bld 110 (*)    BUN 30 (*)    Creatinine, Ser 2.64 (*)    Calcium 8.6 (*)    Total Protein 8.5 (*)    Albumin 2.6 (*)    GFR, Estimated 25 (*)    All other components within normal limits  LACTIC ACID, PLASMA - Abnormal; Notable for the following components:   Lactic Acid, Venous 2.2 (*)    All other components within normal limits  HEMOGLOBIN A1C - Abnormal; Notable for the following components:   Hgb A1c MFr Bld 6.7 (*)    All other components within normal limits  CBG MONITORING, ED - Abnormal; Notable for the following components:   Glucose-Capillary 107 (*)    All other components within normal limits  I-STAT VENOUS BLOOD GAS, ED - Abnormal; Notable for the following components:   pCO2, Ven 35.7 (*)    pO2, Ven 25.0 (*)    Calcium, Ion 1.06 (*)    HCT 37.0 (*)    Hemoglobin 12.6 (*)    All other components within normal limits  SARS CORONAVIRUS 2 (TAT 6-24 HRS)  CULTURE, BLOOD (ROUTINE X 2)  CULTURE, BLOOD (ROUTINE X 2)  URINE CULTURE  PROCALCITONIN  SODIUM, URINE, RANDOM  CREATININE, URINE, RANDOM  OSMOLALITY, URINE  LACTIC ACID, PLASMA  LACTIC ACID, PLASMA   BLOOD GAS, VENOUS  MAGNESIUM  PHOSPHORUS  CBC WITH DIFFERENTIAL/PLATELET  TSH  COMPREHENSIVE METABOLIC PANEL  TROPONIN I (HIGH SENSITIVITY)    ED Course  I have reviewed the triage vital signs and the nursing notes.  Pertinent labs & imaging results that were available during my care of the patient were reviewed by me and considered in my medical decision making (see chart for details).  Clinical Course as of 10/23/20 0029  Mon Oct 22, 2020  1955 DG Chest Portable 1 View [CC]    Clinical Course User Index [CC] Tretha Sciara, MD   MDM Rules/Calculators/A&P Patient is a 65 YOM with a extensive medical history presenting with a chief complaint of weakness and BLLE swelling rapidly over the past 2 days. Patient's vital signs reassuring and physical exam notable for significant BL edema. No CHF history and echo last July ok. Labs today with evidence of volume overload and UTI with kidney injury. Favor pyelonephritis and patient to be treated for infection with antibiotics (ceftriaxone). Patient with overall reassuring physical exam and stable for admission to the floor at this time.  Disposition: Based on the above findings, I believe patient is stable for admission.   Patient/family educated about specific findings on our evaluation and explained exact reasons for admission.  Patient/family educated about clinical situation and time was allowed to answer questions.   Admission team communicated with and agreed with need for admission. Patient admitted. Patient  ready to  move at this time.    Emergency Department Medication Summary: Medications  cefTRIAXone (ROCEPHIN) 1 g in sodium chloride 0.9 % 100 mL IVPB (has no administration in time range)  insulin aspart (novoLOG) injection 0-6 Units (has no administration in time range)  apixaban (ELIQUIS) tablet 5 mg (has no administration in time range)  loratadine (CLARITIN) tablet 10 mg (has no administration in time range)  metoprolol  tartrate (LOPRESSOR) tablet 50 mg (has no administration in time range)  pravastatin (PRAVACHOL) tablet 40 mg (40 mg Oral Not Given 10/23/20 0004)  albuterol (VENTOLIN HFA) 108 (90 Base) MCG/ACT inhaler 2 puff (has no administration in time range)  acetaminophen (TYLENOL) tablet 650 mg (has no administration in time range)    Or  acetaminophen (TYLENOL) suppository 650 mg (has no administration in time range)  HYDROcodone-acetaminophen (NORCO/VICODIN) 5-325 MG per tablet 1-2 tablet (has no administration in time range)  docusate sodium (COLACE) capsule 100 mg (has no administration in time range)  senna (SENOKOT) tablet 8.6 mg (has no administration in time range)  0.9 %  sodium chloride infusion (has no administration in time range)  cefTRIAXone (ROCEPHIN) 1 g in sodium chloride 0.9 % 100 mL IVPB (0 g Intravenous Stopped 10/22/20 2231)      Final Clinical Impression(s) / ED Diagnoses Final diagnoses:  Pyelonephritis    Rx / DC Orders ED Discharge Orders    None       Tretha Sciara, MD 10/23/20 0030    Tegeler, Gwenyth Allegra, MD 10/24/20 [phone]

## 2020-10-22 NOTE — Assessment & Plan Note (Signed)
No injury but high risk for recurrence, pt needs to be seen at ED

## 2020-10-22 NOTE — Patient Instructions (Signed)
You had a significant fall today with generalized weakness, recent lower appetite, lower fluid intake, and dark urine today, so you appear to be high risk for another fall, and we are not sure what else may be going on  Please go to [facility] Emergency for further evaluation

## 2020-10-23 ENCOUNTER — Observation Stay (HOSPITAL_COMMUNITY): Payer: Medicare Other

## 2020-10-23 ENCOUNTER — Encounter (HOSPITAL_COMMUNITY): Payer: Self-pay | Admitting: Internal Medicine

## 2020-10-23 DIAGNOSIS — N179 Acute kidney failure, unspecified: Secondary | ICD-10-CM | POA: Diagnosis present

## 2020-10-23 DIAGNOSIS — N390 Urinary tract infection, site not specified: Secondary | ICD-10-CM | POA: Diagnosis present

## 2020-10-23 DIAGNOSIS — Z7901 Long term (current) use of anticoagulants: Secondary | ICD-10-CM | POA: Diagnosis not present

## 2020-10-23 DIAGNOSIS — E876 Hypokalemia: Secondary | ICD-10-CM | POA: Diagnosis present

## 2020-10-23 DIAGNOSIS — C9000 Multiple myeloma not having achieved remission: Secondary | ICD-10-CM | POA: Diagnosis present

## 2020-10-23 DIAGNOSIS — I4821 Permanent atrial fibrillation: Secondary | ICD-10-CM | POA: Diagnosis present

## 2020-10-23 DIAGNOSIS — Z8546 Personal history of malignant neoplasm of prostate: Secondary | ICD-10-CM | POA: Diagnosis not present

## 2020-10-23 DIAGNOSIS — Z833 Family history of diabetes mellitus: Secondary | ICD-10-CM | POA: Diagnosis not present

## 2020-10-23 DIAGNOSIS — N12 Tubulo-interstitial nephritis, not specified as acute or chronic: Secondary | ICD-10-CM

## 2020-10-23 DIAGNOSIS — E1122 Type 2 diabetes mellitus with diabetic chronic kidney disease: Secondary | ICD-10-CM | POA: Diagnosis present

## 2020-10-23 DIAGNOSIS — I872 Venous insufficiency (chronic) (peripheral): Secondary | ICD-10-CM | POA: Diagnosis not present

## 2020-10-23 DIAGNOSIS — A4159 Other Gram-negative sepsis: Secondary | ICD-10-CM | POA: Diagnosis present

## 2020-10-23 DIAGNOSIS — Z20822 Contact with and (suspected) exposure to covid-19: Secondary | ICD-10-CM | POA: Diagnosis present

## 2020-10-23 DIAGNOSIS — I130 Hypertensive heart and chronic kidney disease with heart failure and stage 1 through stage 4 chronic kidney disease, or unspecified chronic kidney disease: Secondary | ICD-10-CM | POA: Diagnosis present

## 2020-10-23 DIAGNOSIS — N401 Enlarged prostate with lower urinary tract symptoms: Secondary | ICD-10-CM | POA: Diagnosis present

## 2020-10-23 DIAGNOSIS — Z6835 Body mass index (BMI) 35.0-35.9, adult: Secondary | ICD-10-CM | POA: Diagnosis not present

## 2020-10-23 DIAGNOSIS — Z87891 Personal history of nicotine dependence: Secondary | ICD-10-CM | POA: Diagnosis not present

## 2020-10-23 DIAGNOSIS — N1832 Chronic kidney disease, stage 3b: Secondary | ICD-10-CM | POA: Diagnosis present

## 2020-10-23 DIAGNOSIS — I5032 Chronic diastolic (congestive) heart failure: Secondary | ICD-10-CM | POA: Diagnosis present

## 2020-10-23 DIAGNOSIS — Z79899 Other long term (current) drug therapy: Secondary | ICD-10-CM | POA: Diagnosis not present

## 2020-10-23 DIAGNOSIS — E1142 Type 2 diabetes mellitus with diabetic polyneuropathy: Secondary | ICD-10-CM | POA: Diagnosis present

## 2020-10-23 DIAGNOSIS — E871 Hypo-osmolality and hyponatremia: Secondary | ICD-10-CM | POA: Diagnosis present

## 2020-10-23 DIAGNOSIS — E872 Acidosis: Secondary | ICD-10-CM | POA: Diagnosis present

## 2020-10-23 DIAGNOSIS — K5792 Diverticulitis of intestine, part unspecified, without perforation or abscess without bleeding: Secondary | ICD-10-CM | POA: Diagnosis present

## 2020-10-23 DIAGNOSIS — E669 Obesity, unspecified: Secondary | ICD-10-CM | POA: Diagnosis present

## 2020-10-23 DIAGNOSIS — J449 Chronic obstructive pulmonary disease, unspecified: Secondary | ICD-10-CM | POA: Diagnosis present

## 2020-10-23 DIAGNOSIS — E785 Hyperlipidemia, unspecified: Secondary | ICD-10-CM | POA: Diagnosis present

## 2020-10-23 DIAGNOSIS — R609 Edema, unspecified: Secondary | ICD-10-CM | POA: Diagnosis not present

## 2020-10-23 LAB — GLUCOSE, CAPILLARY
Glucose-Capillary: 106 mg/dL — ABNORMAL HIGH (ref 70–99)
Glucose-Capillary: 112 mg/dL — ABNORMAL HIGH (ref 70–99)
Glucose-Capillary: 120 mg/dL — ABNORMAL HIGH (ref 70–99)
Glucose-Capillary: 188 mg/dL — ABNORMAL HIGH (ref 70–99)
Glucose-Capillary: 120 mg/dL — ABNORMAL HIGH (ref 70–99)
Glucose-Capillary: 164 mg/dL — ABNORMAL HIGH (ref 70–99)
Glucose-Capillary: 173 mg/dL — ABNORMAL HIGH (ref 70–99)

## 2020-10-23 LAB — LACTIC ACID, PLASMA
Lactic Acid, Venous: 1.3 mmol/L (ref 0.5–1.9)
Lactic Acid, Venous: 1.5 mmol/L (ref 0.5–1.9)

## 2020-10-23 LAB — I-STAT VENOUS BLOOD GAS, ED
Acid-base deficit: 1 mmol/L (ref 0.0–2.0)
Bicarbonate: 23.5 mmol/L (ref 20.0–28.0)
Calcium, Ion: 1.06 mmol/L — ABNORMAL LOW (ref 1.15–1.40)
HCT: 37 % — ABNORMAL LOW (ref 39.0–52.0)
Hemoglobin: 12.6 g/dL — ABNORMAL LOW (ref 13.0–17.0)
O2 Saturation: 48 %
Potassium: 3.5 mmol/L (ref 3.5–5.1)
Sodium: 139 mmol/L (ref 135–145)
TCO2: 25 mmol/L (ref 22–32)
pCO2, Ven: 35.7 mmHg — ABNORMAL LOW (ref 44.0–60.0)
pH, Ven: 7.426 (ref 7.250–7.430)
pO2, Ven: 25 mmHg — CL (ref 32.0–45.0)

## 2020-10-23 LAB — CBC WITH DIFFERENTIAL/PLATELET
Abs Immature Granulocytes: 0.07 10*3/uL (ref 0.00–0.07)
Basophils Absolute: 0 10*3/uL (ref 0.0–0.1)
Basophils Relative: 0 %
Eosinophils Absolute: 0 10*3/uL (ref 0.0–0.5)
Eosinophils Relative: 0 %
HCT: 41.6 % (ref 39.0–52.0)
Hemoglobin: 13.6 g/dL (ref 13.0–17.0)
Immature Granulocytes: 1 %
Lymphocytes Relative: 7 %
Lymphs Abs: 1 10*3/uL (ref 0.7–4.0)
MCH: 27.4 pg (ref 26.0–34.0)
MCHC: 32.7 g/dL (ref 30.0–36.0)
MCV: 83.7 fL (ref 80.0–100.0)
Monocytes Absolute: 1.1 10*3/uL — ABNORMAL HIGH (ref 0.1–1.0)
Monocytes Relative: 8 %
Neutro Abs: 12 10*3/uL — ABNORMAL HIGH (ref 1.7–7.7)
Neutrophils Relative %: 84 %
Platelets: 216 10*3/uL (ref 150–400)
RBC: 4.97 MIL/uL (ref 4.22–5.81)
RDW: 14.9 % (ref 11.5–15.5)
WBC: 14.3 10*3/uL — ABNORMAL HIGH (ref 4.0–10.5)
nRBC: 0 % (ref 0.0–0.2)

## 2020-10-23 LAB — TROPONIN I (HIGH SENSITIVITY)
Troponin I (High Sensitivity): 129 ng/L (ref <18)
Troponin I (High Sensitivity): 103 ng/L (ref <18)

## 2020-10-23 LAB — PHOSPHORUS: Phosphorus: 3 mg/dL (ref 2.5–4.6)

## 2020-10-23 LAB — BLOOD GAS, VENOUS
Acid-base deficit: 2.2 mmol/L — ABNORMAL HIGH (ref 0.0–2.0)
Bicarbonate: 22.2 mmol/L (ref 20.0–28.0)
Drawn by: 2102
O2 Saturation: 70 %
Patient temperature: 37
pCO2, Ven: 38.9 mmHg — ABNORMAL LOW (ref 44.0–60.0)
pH, Ven: 7.376 (ref 7.250–7.430)
pO2, Ven: 40.6 mmHg (ref 32.0–45.0)

## 2020-10-23 LAB — COMPREHENSIVE METABOLIC PANEL
ALT: 14 U/L (ref 0–44)
AST: 40 U/L (ref 15–41)
Albumin: 2.7 g/dL — ABNORMAL LOW (ref 3.5–5.0)
Alkaline Phosphatase: 93 U/L (ref 38–126)
Anion gap: 11 (ref 5–15)
BUN: 29 mg/dL — ABNORMAL HIGH (ref 8–23)
CO2: 22 mmol/L (ref 22–32)
Calcium: 8.6 mg/dL — ABNORMAL LOW (ref 8.9–10.3)
Chloride: 101 mmol/L (ref 98–111)
Creatinine, Ser: 2.53 mg/dL — ABNORMAL HIGH (ref 0.61–1.24)
GFR, Estimated: 26 mL/min — ABNORMAL LOW (ref >60)
Glucose, Bld: 108 mg/dL — ABNORMAL HIGH (ref 70–99)
Potassium: 3.3 mmol/L — ABNORMAL LOW (ref 3.5–5.1)
Sodium: 134 mmol/L — ABNORMAL LOW (ref 135–145)
Total Bilirubin: 1.1 mg/dL (ref 0.3–1.2)
Total Protein: 8.3 g/dL — ABNORMAL HIGH (ref 6.5–8.1)

## 2020-10-23 LAB — SARS CORONAVIRUS 2 (TAT 6-24 HRS): SARS Coronavirus 2: NEGATIVE

## 2020-10-23 LAB — TSH: TSH: 1.374 u[IU]/mL (ref 0.350–4.500)

## 2020-10-23 LAB — CBG MONITORING, ED: Glucose-Capillary: 106 mg/dL — ABNORMAL HIGH (ref 70–99)

## 2020-10-23 LAB — MAGNESIUM: Magnesium: 1.7 mg/dL (ref 1.7–2.4)

## 2020-10-23 LAB — MRSA PCR SCREENING: MRSA by PCR: NEGATIVE

## 2020-10-23 LAB — PROCALCITONIN: Procalcitonin: 20.72 ng/mL

## 2020-10-23 MED ORDER — SODIUM CHLORIDE 0.9 % IV SOLN: INTRAVENOUS | Status: DC

## 2020-10-23 MED ORDER — PIPERACILLIN-TAZOBACTAM 3.375 G IVPB
3.3750 g | Freq: Three times a day (TID) | INTRAVENOUS | Status: DC
  Administered 2020-10-23 – 2020-10-25 (×7): 3.375 g via INTRAVENOUS
  Filled 2020-10-23 (×7): qty 50

## 2020-10-23 MED ORDER — FAMOTIDINE 20 MG PO TABS
10.0000 mg | ORAL_TABLET | Freq: Once | ORAL | Status: AC | PRN
  Administered 2020-10-23: 10 mg via ORAL
  Filled 2020-10-23: qty 1

## 2020-10-23 MED ORDER — ENSURE ENLIVE PO LIQD
237.0000 mL | Freq: Two times a day (BID) | ORAL | Status: DC
  Administered 2020-10-24 – 2020-10-26 (×2): 237 mL via ORAL

## 2020-10-23 MED ORDER — IPRATROPIUM-ALBUTEROL 0.5-2.5 (3) MG/3ML IN SOLN
3.0000 mL | Freq: Four times a day (QID) | RESPIRATORY_TRACT | Status: DC
  Administered 2020-10-23: 3 mL via RESPIRATORY_TRACT
  Filled 2020-10-23: qty 3

## 2020-10-23 MED ORDER — PIPERACILLIN-TAZOBACTAM 3.375 G IVPB 30 MIN
3.3750 g | Freq: Once | INTRAVENOUS | Status: AC
  Administered 2020-10-23: 3.375 g via INTRAVENOUS
  Filled 2020-10-23: qty 50

## 2020-10-23 MED ORDER — IPRATROPIUM-ALBUTEROL 0.5-2.5 (3) MG/3ML IN SOLN
3.0000 mL | Freq: Two times a day (BID) | RESPIRATORY_TRACT | Status: DC
  Administered 2020-10-23: 3 mL via RESPIRATORY_TRACT
  Filled 2020-10-23: qty 3

## 2020-10-23 NOTE — Progress Notes (Signed)
Initial Nutrition Assessment  DOCUMENTATION CODES:   Not applicable  INTERVENTION:  Provide Ensure Enlive po BID, each supplement provides 350 kcal and 20 grams of protein  Recommend obtaining new weight to fully assess weight trends.   NUTRITION DIAGNOSIS:   Increased nutrient needs related to chronic illness (CHF, COPD) as evidenced by estimated needs.  GOAL:   Patient will meet greater than or equal to 90% of their needs  MONITOR:   PO intake,Supplement acceptance,Skin,Weight trends,Labs,I & O's  REASON FOR ASSESSMENT:   Consult Assessment of nutrition requirement/status  ASSESSMENT:   75 year old male with history of type 2 diabetes mellitus, COPD, chronic diastolic CHF, hypertension, CKD3, dyslipidemia presents with sepsis, weakness, fall, and confusion.   Per MD, pt with sepsis etiology not clear. Urinalysis is abnormal and noncontrast CT abdomen raises concern for enteritis and diverticulitis. Meal completion 90% at breakfast this morning. Pt asleep during time of visit and did not awaken to RD assessment. RD unable to obtain pt nutrition history at this time. Noted, no new weight since admission. Recommend obtaining new weight to fully assess weight trends. RD to order nutritional supplements to aid in caloric and protein needs.   NUTRITION - FOCUSED PHYSICAL EXAM:  Flowsheet Row Most Recent Value  Orbital Region No depletion  Upper Arm Region No depletion  Thoracic and Lumbar Region No depletion  Buccal Region No depletion  Temple Region Unable to assess  Clavicle Bone Region No depletion  Clavicle and Acromion Bone Region No depletion  Scapular Bone Region Unable to assess  Dorsal Hand Unable to assess  Patellar Region No depletion  Anterior Thigh Region No depletion  Posterior Calf Region No depletion  Edema (RD Assessment) Moderate  Hair Reviewed  Eyes Unable to assess  Mouth Unable to assess  Skin Reviewed  Nails Unable to assess     Labs and  medications reviewed.   Diet Order:   Diet Order            DIET SOFT Room service appropriate? Yes; Fluid consistency: Thin  Diet effective now                 EDUCATION NEEDS:   Not appropriate for education at this time  Skin:  Skin Assessment: Reviewed RN Assessment  Last BM:  4/5  Height:   Ht Readings from Last 1 Encounters:  10/22/20 6\' 1"  (1.854 m)    Weight:   Wt Readings from Last 1 Encounters:  08/23/20 122.8 kg    Estimated Nutritional Needs:   Kcal:  2100-2400  Protein:  105-120 grams  Fluid:  2 L/day  Corrin Parker, MS, RD, LDN RD pager number/after hours weekend pager number on Amion.

## 2020-10-23 NOTE — Evaluation (Signed)
Physical Therapy Evaluation Patient Details Name: [name] MRN: [id number] DOB: 1946-01-15 Today's Date: 10/23/2020   History of Present Illness  Pt is a 75 y.o. M admitted 4/4 who presents with a fall and AKI on CKD. Significant PMH: DM, COPD. atrial fibrillation, CKD, venous insufficiency.  Clinical Impression  PTA, pt lives with his family, uses a walker for ambulation and is independent with ADL's. Pt presents with generalized weakness, balance deficits, and decreased endurance. Requiring up to moderate assist for transitions to standing (from a lower surface) and ambulating x 20 feet with a walker at a min guard assist level. Presents as a high fall risk in setting of decreased gait speed and history of falls. Would benefit from follow up HHPT to address deficits and maximize functional independence.     Follow Up Recommendations Home health PT;Supervision/Assistance - 24 hour    Equipment Recommendations  None recommended by PT    Recommendations for Other Services       Precautions / Restrictions Precautions Precautions: Fall Restrictions Weight Bearing Restrictions: No      Mobility  Bed Mobility Overal bed mobility: Needs Assistance Bed Mobility: Supine to Sit     Supine to sit: Supervision     General bed mobility comments: Increased time, cues provided for initiation and sequencing. No physical assist    Transfers Overall transfer level: Needs assistance Equipment used: Rolling walker (2 wheeled) Transfers: Sit to/from Stand Sit to Stand: Min guard;Mod assist         General transfer comment: Min guard to rise from elevated bed height, use of momentum. ModA to rise from lower toilet seat  Ambulation/Gait Ambulation/Gait assistance: Min guard Gait Distance (Feet): 20 Feet Assistive device: Rolling walker (2 wheeled) Gait Pattern/deviations: Step-through pattern;Decreased stride length;Trunk flexed Gait velocity: decreased Gait velocity  interpretation: <1.8 ft/sec, indicate of risk for recurrent falls General Gait Details: Slow and effortful gait with increased trunk flexion. Cues for upright posture, keeping feet on inside of walker, and walker proximity. Pt unable to significantly correct. Min guard for stability, no overt LOB with negotiating level and unlevel surfaces  Stairs            Wheelchair Mobility    Modified Rankin (Stroke Patients Only)       Balance Overall balance assessment: Needs assistance Sitting-balance support: Feet supported Sitting balance-Leahy Scale: Good     Standing balance support: Bilateral upper extremity supported Standing balance-Leahy Scale: Poor Standing balance comment: reliant on external support                             Pertinent Vitals/Pain Pain Assessment: Faces Faces Pain Scale: No hurt    Home Living Family/patient expects to be discharged to:: Private residence Living Arrangements: Children Available Help at Discharge: Family;Available 24 hours/day Type of Home: House Home Access: Stairs to enter   [employer] of Steps: 3 Home Layout: One level Home Equipment: Cane - single point;Walker - 2 wheels      Prior Function Level of Independence: Needs assistance   Gait / Transfers Assistance Needed: uses walker  ADL's / Homemaking Assistance Needed: independent ADL's, has been taking bird baths as he is "scared to get in the shower." Family assisting with IADL's  Comments: Denies other falls in past month other than one PTA     Hand Dominance        Extremity/Trunk Assessment   Upper Extremity Assessment Upper Extremity Assessment: Defer  to OT evaluation    Lower Extremity Assessment Lower Extremity Assessment: RLE deficits/detail;LLE deficits/detail;Generalized weakness RLE Deficits / Details: Wound distally 2x2 cm RLE Sensation: history of peripheral neuropathy LLE Sensation: history of peripheral neuropathy        Communication   Communication: No difficulties  Cognition Arousal/Alertness: Awake/alert Behavior During Therapy: WFL for tasks assessed/performed Overall Cognitive Status: Within Functional Limits for tasks assessed                                        General Comments      Exercises     Assessment/Plan    PT Assessment Patient needs continued PT services  PT Problem List Decreased strength;Decreased activity tolerance;Decreased balance;Decreased mobility;Obesity       PT Treatment Interventions DME instruction;Gait training;Stair training;Functional mobility training;Therapeutic activities;Therapeutic exercise;Balance training;Patient/family education    PT Goals (Current goals can be found in the Care Plan section)  Acute Rehab PT Goals Patient Stated Goal: improved strength PT Goal Formulation: With patient Time For Goal Achievement: 11/06/20 Potential to Achieve Goals: Good    Frequency Min 3X/week   Barriers to discharge        Co-evaluation               AM-PAC PT "6 Clicks" Mobility  Outcome Measure Help needed turning from your back to your side while in a flat bed without using bedrails?: None Help needed moving from lying on your back to sitting on the side of a flat bed without using bedrails?: A Little Help needed moving to and from a bed to a chair (including a wheelchair)?: A Little Help needed standing up from a chair using your arms (e.g., wheelchair or bedside chair)?: A Lot Help needed to walk in hospital room?: A Little Help needed climbing 3-5 steps with a railing? : A Lot 6 Click Score: 17    End of Session Equipment Utilized During Treatment: Gait belt Activity Tolerance: Patient tolerated treatment well Patient left: in chair;with call bell/phone within reach;with chair alarm set Nurse Communication: Mobility status PT Visit Diagnosis: Unsteadiness on feet (R26.81);Muscle weakness (generalized) (M62.81);History of  falling (Z91.81);Difficulty in walking, not elsewhere classified (R26.2)    Time: 1694-5038 PT Time Calculation (min) (ACUTE ONLY): 25 min   Charges:   PT Evaluation $PT Eval Moderate Complexity: 1 Mod PT Treatments $Gait Training: 8-22 mins        Wyona Almas, PT, DPT Acute Rehabilitation Services Pager [phone] Office [phone]   Deno Etienne 10/23/2020, 8:54 AM

## 2020-10-23 NOTE — Progress Notes (Signed)
Inpatient Diabetes Program Recommendations  AACE/ADA: New Consensus Statement on Inpatient Glycemic Control (2015)  Target Ranges:  Prepandial:   less than 140 mg/dL      Peak postprandial:   less than 180 mg/dL (1-2 hours)      Critically ill patients:  140 - 180 mg/dL   Lab Results  Component Value Date   GLUCAP 120 (H) 10/23/2020   HGBA1C 6.7 (H) 10/22/2020    Review of Glycemic Control Results for [name], [name] (MRN [id number]) as of 10/23/2020 12:36  Ref. Range 10/22/2020 19:41 10/23/2020 00:32 10/23/2020 02:04 10/23/2020 04:21 10/23/2020 08:48  Glucose-Capillary Latest Ref Range: 70 - 99 mg/dL 107 (H) 106 (H) 106 (H) 112 (H) 120 (H)   Diabetes history: DM 2 Outpatient Diabetes medications:  None-diet controlled Current orders for Inpatient glycemic control:  Novolog "very sensitive" q 4 hours  Inpatient Diabetes Program Recommendations:    A1C reflects good control of blood sugars prior to admit.  Agree with current orders.  Will follow.   Thanks,  Adah Perl, RN, BC-ADM Inpatient Diabetes Coordinator Pager [phone]

## 2020-10-23 NOTE — Evaluation (Signed)
Occupational Therapy Evaluation Patient Details Name: [name] MRN: [id number] DOB: [birth year] Today's Date: 10/23/2020    History of Present Illness Pt is a 75 y.o. M admitted 4/4 who presents with a fall and AKI on CKD.Sepsis, possible UTI-poa Significant PMH: DM, COPD. atrial fibrillation, CKD, venous insufficiency.   Clinical Impression   PT admitted with sepsis UTI (+) . Pt currently with functional limitiations due to the deficits listed below (see OT problem list). Pt decreased mobility and sit<>Stand from elevated bed surface. Pt with LOB with backing up with RW.  Pt will benefit from skilled OT to increase their independence and safety with adls and balance to allow discharge SNF.     Follow Up Recommendations  SNF (no family present to confirm home d/c)    Equipment Recommendations  Wheelchair (measurements OT);Hospital bed;Wheelchair cushion (measurements OT)    Recommendations for Other Services       Precautions / Restrictions Precautions Precautions: Fall Precaution Comments: foley      Mobility Bed Mobility Overal bed mobility: Needs Assistance Bed Mobility: Supine to Sit;Sit to Supine;Rolling Rolling: Min assist (max (A) without verbal cues)   Supine to sit: Mod assist Sit to supine: Min assist   General bed mobility comments: pt requires max cues to sequence task to exit bed on the L side of the bed. pt with lateral R lean and lacks awareness. pt needs cues to adjust. pt with posterior pelvic tilt. pt able to progress to supine and place bil LE back on bed surface    Transfers Overall transfer level: Needs assistance Equipment used: Rolling walker (2 wheeled) Transfers: Sit to/from Stand Sit to Stand: Max assist         General transfer comment: multiple attempts and elevated bed surface required to achieve sit<>stand x2. pt with foward steps x2 and then asked to back up to bed surface. pt with LOB and max (A) to sustain upright   posture.    Balance Overall balance assessment: Needs assistance Sitting-balance support: Bilateral upper extremity supported;Feet supported Sitting balance-Leahy Scale: Poor   Postural control: Right lateral lean Standing balance support: Bilateral upper extremity supported;During functional activity Standing balance-Leahy Scale: Poor Standing balance comment: reliance on RW                           ADL either performed or assessed with clinical judgement   ADL Overall ADL's : Needs assistance/impaired Eating/Feeding: Moderate assistance;Sitting Eating/Feeding Details (indicate cue type and reason): required (A) of daughter per RN staff. pt sleeping through lunch     Upper Body Bathing: Moderate assistance   Lower Body Bathing: Maximal assistance       Lower Body Dressing: Maximal assistance   Toilet Transfer: Maximal assistance   Toileting- Clothing Manipulation and Hygiene: Total assistance       Functional mobility during ADLs: Maximal assistance General ADL Comments: pt in bed and able to progress to EOB with cues to sequence task. pt with strong R lateral lean. pt required multiple attempts to complete sit<>Stand     Vision Baseline Vision/History: Wears glasses Wears Glasses: Reading only Additional Comments: reports the wrong pair of glasses present in room . that he likes a different pair better     Perception     Praxis      Pertinent Vitals/Pain Pain Assessment: No/denies pain     Hand Dominance Right   Extremity/Trunk Assessment Upper Extremity Assessment Upper Extremity Assessment: Generalized weakness  Lower Extremity Assessment Lower Extremity Assessment: Defer to PT evaluation   Cervical / Trunk Assessment Cervical / Trunk Assessment: Kyphotic   Communication Communication Communication: No difficulties   Cognition Arousal/Alertness: Awake/alert Behavior During Therapy: WFL for tasks assessed/performed Overall Cognitive  Status: Impaired/Different from baseline Area of Impairment: Memory;Safety/judgement;Awareness                     Memory: Decreased short-term memory;Decreased recall of precautions   Safety/Judgement: Decreased awareness of safety;Decreased awareness of deficits Awareness: Intellectual   General Comments: pt was unaware of reason for admission reports "my legs are weak" pt unaware of UTI. pt asking the same question multiple times and no recall of information later in session. pt stated he like to apologize now incase he says or does something that makes him seem angry. it is not staff fault he just doesnt like hospitals and it makes him not happy but its not staff and he does not mean it   General Comments  pt high risk for fall and skin break down due to decreased mobility. recommend frequent repositioning to help wiht skin integrity.    Exercises     Shoulder Instructions      Home Living Family/patient expects to be discharged to:: Private residence Living Arrangements: Children Available Help at Discharge: Family;Available 24 hours/day Type of Home: House Home Access: Stairs to enter [employer] of Steps: 3   Home Layout: One level     Bathroom Shower/Tub: Teacher, early years/pre: Standard     Home Equipment: Cane - single point;Walker - 2 wheels;Other (comment) (lfit chair)   Additional Comments: sleeps in lift chair most of the time      Prior Functioning/Environment Level of Independence: Needs assistance  Gait / Transfers Assistance Needed: uses walker ADL's / Homemaking Assistance Needed: independent ADL's, has been taking bird baths as he is "scared to get in the shower." Family assisting with IADL's            OT Problem List: Decreased strength;Decreased activity tolerance;Impaired balance (sitting and/or standing);Decreased cognition;Decreased safety awareness;Decreased knowledge of use of DME or AE;Decreased knowledge of  precautions;Obesity;Increased edema      OT Treatment/Interventions: Self-care/ADL training;Therapeutic exercise;Neuromuscular education;Energy conservation;DME and/or AE instruction;Manual therapy;Modalities;Therapeutic activities;Cognitive remediation/compensation;Patient/family education;Balance training    OT Goals(Current goals can be found in the care plan section) Acute Rehab OT Goals Patient Stated Goal: none stated OT Goal Formulation: Patient unable to participate in goal setting Time For Goal Achievement: 11/05/20 Potential to Achieve Goals: Good  OT Frequency: Min 2X/week   Barriers to D/C: Other (comment) (uncertain if family can help at current level)          Co-evaluation              AM-PAC OT "6 Clicks" Daily Activity     Outcome Measure Help from another person eating meals?: A Lot Help from another person taking care of personal grooming?: A Lot Help from another person toileting, which includes using toliet, bedpan, or urinal?: A Lot Help from another person bathing (including washing, rinsing, drying)?: A Lot Help from another person to put on and taking off regular upper body clothing?: A Lot Help from another person to put on and taking off regular lower body clothing?: A Lot 6 Click Score: 12   End of Session Equipment Utilized During Treatment: Gait belt;Rolling walker Nurse Communication: Mobility status;Precautions  Activity Tolerance: Patient tolerated treatment well Patient left: in bed;with call bell/phone  within reach;with bed alarm set  OT Visit Diagnosis: Unsteadiness on feet (R26.81);Muscle weakness (generalized) (M62.81)                Time: 1305-1330 OT Time Calculation (min): 25 min Charges:  OT General Charges $OT Visit: 1 Visit OT Evaluation $OT Eval Moderate Complexity: 1 Mod OT Treatments $Self Care/Home Management : 8-22 mins   Brynn, OTR/L  Acute Rehabilitation Services Pager: [phone] Office:  [phone] .   Jeri Modena 10/23/2020, 3:20 PM

## 2020-10-23 NOTE — Progress Notes (Signed)
Lower extremity venous study attempted. Patient in chair, eating. Will attempt again as schedule and patient availability permits.   Darlin Coco, RDMS, RVT

## 2020-10-23 NOTE — Progress Notes (Signed)
Lower extremity venous bilateral attempted x3. Called for patient to be moved to bed x2. Will attempt again as patient availability permits.

## 2020-10-23 NOTE — Consult Note (Signed)
WOC Nurse Consult Note: Patient receiving care in [facility] [address]. Reason for Consult: "venous insufficiency" Wound type: same Pressure Injury POA: Yes/No/NA Measurement: approximately 2 cm x 2 cm RLE only Wound bed: pink Drainage (amount, consistency, odor) none Periwound: changes associated with long term venous insufficiency Dressing procedure/placement/frequency: twice weekly application of unna boots. Patient states he has used unna boots in the past and has tolerated them well.  He wears "compression socks" when not in the hospital.  I am also adding bilateral prevalon heel lift boots. Thank you for the consult.  Discussed plan of care with the patient.  [place] nurse will not follow at this time.  Please re-consult the [place] team if needed.  Val Riles, RN, MSN, CWOCN, CNS-BC, pager [phone]

## 2020-10-23 NOTE — Progress Notes (Signed)
Orthopedic Tech Progress Note Patient Details:  [name] [birth year] [id number]  Ortho Devices Type of Ortho Device: Haematologist Ortho Device/Splint Location: BLE Ortho Device/Splint Interventions: Ordered,Application,Adjustment   Post Interventions Patient Tolerated: Well Instructions Provided: Care of device   Janit Pagan 10/23/2020, 2:18 PM

## 2020-10-23 NOTE — Progress Notes (Signed)
PROGRESS NOTE    [name]  DQQ:[id number] DOB: 27-Aug-1945 DOA: 10/22/2020 PCP: Biagio Borg, MD  Brief Narrative: Chronically ill 75 year old male with history of type 2 diabetes mellitus, COPD, chronic diastolic CHF, hypertension, CKD3, dyslipidemia was brought to the ED by his daughter yesterday with multiple complaints including weakness for the past 2-3 days, slight confusion, fall. -In the ED vital signs were stable, temp was 99.4, WBC was 16, procalcitonin was significantly elevated, urinalysis was abnormal chest x-ray noted cardiomegaly, labs noted creatinine of 2.5 up from baseline of 1.9, CT abdomen pelvis without contrast raised concern for small bowel enteritis versus diverticulitis versus SBO.  Patient denies any abdominal symptoms whatsoever   Assessment & Plan:   Sepsis, possible UTI-poa -Etiology is not clear, patient denies any localizing symptoms -Urinalysis is abnormal and noncontrast CT abdomen raises concern for enteritis and diverticulitis, patient denies any pain nausea vomiting or diarrhea and exam is benign -Chest x-ray is benign, no concerning wounds, only has a small skin tear on his left leg -Continue empiric Zosyn today, follow-up urine culture -CBC in a.m.  AKI on CKD 3a -Creatinine 2.5, Baseline around 1.8 -Holding diuretics, gentle IV fluids today -Monitor urine output, BMP in a.m.  Chronic diastolic CHF -See discussion above, diuretics on hold today  Chronic venous insufficiency -Leg elevation, holding diuretics today  Permanent atrial fibrillation -Rate controlled, continue Eliquis  COPD -No wheezing, continue nebs and home regimen  Type 2 diabetes mellitus -?  Diet controlled, check hemoglobin A1c  DVT prophylaxis: Apixaban Code Status: Full code Family Communication: Discussed with patient's daughter Disposition Plan:  Status is: Observation  The patient will require care spanning > 2 midnights and should be moved to inpatient  because: Inpatient level of care appropriate due to severity of illness  Dispo: The patient is from: Home              Anticipated d/c is to: Home              Patient currently is not medically stable to d/c.   Difficult to place patient No      Procedures:   Antimicrobials:    Subjective: -Feels okay, denies any specific complaints or symptoms, reports that his legs have been giving out and getting weaker for about a year now  Objective: Vitals:   10/23/20 0416 10/23/20 0810 10/23/20 0812 10/23/20 0815  BP: (!) 146/113 (!) 145/81    Pulse: 61 (!) 57  83  Resp: (!) 32 (!) 26    Temp: 99.1 F (37.3 C) 98.7 F (37.1 C)    TempSrc: Oral     SpO2: 95% 99% 96%     Intake/Output Summary (Last 24 hours) at 10/23/2020 1153 Last data filed at 10/23/2020 1941 Gross per 24 hour  Intake 246.74 ml  Output --  Net 246.74 ml   There were no vitals filed for this visit.  Examination:  General exam: Obese chronically ill male sitting up in bed, awake alert oriented to self, place and partly to time, no distress CVS: S1-S2, regular rhythm, systolic murmur Lungs: Poor air movement bilaterally otherwise clear Abdomen: Soft, obese, nontender, mildly distended, bowel sounds present Extremities: Trace edema with chronic skin changes, small skin tear mid left leg Psych: Mood and affect appropriate Neuro: Moves all extremities, no localizing signs  Data Reviewed:   CBC: Recent Labs  Lab 10/22/20 1812 10/23/20 0016 10/23/20 0230  WBC 16.0*  --  14.3*  NEUTROABS  --   --  12.0*  HGB 13.0 12.6* 13.6  HCT 40.9 37.0* 41.6  MCV 85.7  --  83.7  PLT 229  --  941   Basic Metabolic Panel: Recent Labs  Lab 10/22/20 1822 10/23/20 0016 10/23/20 0230  NA 133* 139 134*  K 3.3* 3.5 3.3*  CL 99  --  101  CO2 22  --  22  GLUCOSE 110*  --  108*  BUN 30*  --  29*  CREATININE 2.64*  --  2.53*  CALCIUM 8.6*  --  8.6*  MG  --   --  1.7  PHOS  --   --  3.0   GFR: CrCl cannot be  calculated (Unknown ideal weight.). Liver Function Tests: Recent Labs  Lab 10/22/20 1822 10/23/20 0230  AST 32 40  ALT 14 14  ALKPHOS 101 93  BILITOT 1.0 1.1  PROT 8.5* 8.3*  ALBUMIN 2.6* 2.7*   No results for input(s): LIPASE, AMYLASE in the last 168 hours. No results for input(s): AMMONIA in the last 168 hours. Coagulation Profile: No results for input(s): INR, PROTIME in the last 168 hours. Cardiac Enzymes: No results for input(s): CKTOTAL, CKMB, CKMBINDEX, TROPONINI in the last 168 hours. BNP (last 3 results) No results for input(s): PROBNP in the last 8760 hours. HbA1C: Recent Labs    10/22/20 2215  HGBA1C 6.7*   CBG: Recent Labs  Lab 10/22/20 1941 10/23/20 0032 10/23/20 0204 10/23/20 0421 10/23/20 0848  GLUCAP 107* 106* 106* 112* 120*   Lipid Profile: No results for input(s): CHOL, HDL, LDLCALC, TRIG, CHOLHDL, LDLDIRECT in the last 72 hours. Thyroid Function Tests: Recent Labs    10/23/20 0230  TSH 1.374   Anemia Panel: No results for input(s): VITAMINB12, FOLATE, FERRITIN, TIBC, IRON, RETICCTPCT in the last 72 hours. Urine analysis:    Component Value Date/Time   COLORURINE YELLOW 10/22/2020 2113   APPEARANCEUR CLOUDY (A) 10/22/2020 2113   LABSPEC 1.012 10/22/2020 2113   PHURINE 5.0 10/22/2020 2113   GLUCOSEU NEGATIVE 10/22/2020 2113   GLUCOSEU NEGATIVE 10/06/2019 1636   HGBUR LARGE (A) 10/22/2020 2113   BILIRUBINUR NEGATIVE 10/22/2020 2113   KETONESUR NEGATIVE 10/22/2020 2113   PROTEINUR >=300 (A) 10/22/2020 2113   UROBILINOGEN 0.2 10/06/2019 1636   NITRITE NEGATIVE 10/22/2020 2113   LEUKOCYTESUR SMALL (A) 10/22/2020 2113   Sepsis Labs: @LABRCNTIP (procalcitonin:4,lacticidven:4)  ) Recent Results (from the past 240 hour(s))  SARS CORONAVIRUS 2 (TAT 6-24 HRS) Nasopharyngeal Nasopharyngeal Swab     Status: None   Collection Time: 10/22/20 10:14 PM   Specimen: Nasopharyngeal Swab  Result Value Ref Range Status   SARS Coronavirus 2 NEGATIVE  NEGATIVE Final    Comment: (NOTE) SARS-CoV-2 target nucleic acids are NOT DETECTED.  The SARS-CoV-2 RNA is generally detectable in upper and lower respiratory specimens during the acute phase of infection. Negative results do not preclude SARS-CoV-2 infection, do not rule out co-infections with other pathogens, and should not be used as the sole basis for treatment or other patient management decisions. Negative results must be combined with clinical observations, patient history, and epidemiological information. The expected result is Negative.  Fact Sheet for Patients: SugarRoll.be  Fact Sheet for Healthcare Providers: https://www.woods-mathews.com/  This test is not yet approved or cleared by the [place] FDA and  has been authorized for detection and/or diagnosis of SARS-CoV-2 by FDA under an Emergency Use Authorization (EUA). This EUA will remain  in effect (meaning this test can be used) for the duration of the COVID-19 declaration  under Se ction 564(b)(1) of the Act, 21 U.S.C. section 360bbb-3(b)(1), unless the authorization is terminated or revoked sooner.  Performed at [facility], [place] [address], [place], [place] [address]   Culture, blood (x 2)     Status: None (Preliminary result)   Collection Time: 10/22/20 10:30 PM   Specimen: BLOOD LEFT HAND  Result Value Ref Range Status   Specimen Description BLOOD LEFT HAND  Final   Special Requests   Final    BOTTLES DRAWN AEROBIC AND ANAEROBIC Blood Culture results may not be optimal due to an inadequate volume of blood received in culture bottles   Culture   Final    NO GROWTH < 12 HOURS Performed at [facility], [place] [address] [place], [place] [address]    Report Status PENDING  Incomplete  Culture, blood (x 2)     Status: None (Preliminary result)   Collection Time: 10/22/20 10:35 PM   Specimen: BLOOD RIGHT HAND  Result Value Ref Range Status   Specimen  Description BLOOD RIGHT HAND  Final   Special Requests   Final    BOTTLES DRAWN AEROBIC AND ANAEROBIC Blood Culture results may not be optimal due to an inadequate volume of blood received in culture bottles   Culture   Final    NO GROWTH < 12 HOURS Performed at [facility], [place] [address], [place], [place] [address]    Report Status PENDING  Incomplete  MRSA PCR Screening     Status: None   Collection Time: 10/23/20  2:24 AM   Specimen: Nasal Mucosa; Nasopharyngeal  Result Value Ref Range Status   MRSA by PCR NEGATIVE NEGATIVE Final    Comment:        The GeneXpert MRSA Assay (FDA approved for NASAL specimens only), is one component of a comprehensive MRSA colonization surveillance program. It is not intended to diagnose MRSA infection nor to guide or monitor treatment for MRSA infections. Performed at [facility], [place] [address], [place], [place] [address]          Radiology Studies: CT Head Wo Contrast  Result Date: 10/22/2020 CLINICAL DATA:  Fall EXAM: CT HEAD WITHOUT CONTRAST TECHNIQUE: Contiguous axial images were obtained from the base of the skull through the vertex without intravenous contrast. COMPARISON:  02/15/2020 FINDINGS: Brain: Old left occipital infarct, stable. There is atrophy and chronic small vessel disease changes. No acute intracranial abnormality. Specifically, no hemorrhage, hydrocephalus, mass lesion, acute infarction, or significant intracranial injury. Vascular: No hyperdense vessel or unexpected calcification. Skull: No acute calvarial abnormality. Sinuses/Orbits: No acute findings. Mucous retention cyst in the right maxillary sinus. Other: None IMPRESSION: Atrophy, chronic microvascular disease. No acute intracranial abnormality. Old left occipital infarct. Electronically Signed   By: Rolm Baptise M.D.   On: 10/22/2020 21:21   CT Cervical Spine Wo Contrast  Result Date: 10/22/2020 CLINICAL DATA:  Fall EXAM: CT CERVICAL SPINE WITHOUT  CONTRAST TECHNIQUE: Multidetector CT imaging of the cervical spine was performed without intravenous contrast. Multiplanar CT image reconstructions were also generated. COMPARISON:  No subluxation. FINDINGS: Alignment: No subluxation. Skull base and vertebrae: No acute fracture. No primary bone lesion or focal pathologic process. Soft tissues and spinal canal: No prevertebral fluid or swelling. No visible canal hematoma. Disc levels: Diffuse advanced degenerative disc disease and facet disease. Upper chest: No acute findings Other: None IMPRESSION: Diffuse advanced spondylosis.  No acute bony abnormality. Electronically Signed   By: Rolm Baptise M.D.  On: 10/22/2020 21:25   DG Chest Portable 1 View  Result Date: 10/22/2020 CLINICAL DATA:  75 year old male with shortness of breath. EXAM: PORTABLE CHEST 1 VIEW COMPARISON:  Chest radiograph dated 02/18/2020 FINDINGS: Minimal left lung base atelectasis. No focal consolidation, pleural effusion, pneumothorax. Stable cardiomegaly. Atherosclerotic calcification of the aorta. No acute osseous pathology. IMPRESSION: 1. No acute cardiopulmonary process. 2. Stable cardiomegaly. Electronically Signed   By: Anner Crete M.D.   On: 10/22/2020 19:36   CT Renal Stone Study  Result Date: 10/22/2020 CLINICAL DATA:  Fall EXAM: CT ABDOMEN AND PELVIS WITHOUT CONTRAST TECHNIQUE: Multidetector CT imaging of the abdomen and pelvis was performed following the standard protocol without IV contrast. COMPARISON:  None. FINDINGS: Lower chest: Cardiomegaly.  Bibasilar atelectasis.  No effusions. Hepatobiliary: No focal hepatic abnormality. Gallbladder unremarkable. Pancreas: No focal abnormality or ductal dilatation. Spleen: No focal abnormality.  Normal size. Adrenals/Urinary Tract: 8.6 cm cyst in the upper pole of the left kidney. Numerous bilateral cysts. No hydronephrosis. Mild fullness of the left adrenal gland, stable since prior study, likely hyperplasia. Urinary bladder  unremarkable. Stomach/Bowel: Normal appendix. Left colonic diverticulosis. Inflammatory stranding seen in the left lower abdomen and upper pelvis surrounding the distal descending colon as well as adjacent small bowel loops. Some of the small bowel loops appear thick walled. There are mildly prominent proximal small bowel loops with air-fluid levels. This difficult to determine if the inflammatory process is diverticulitis with secondary inflammation of the adjacent small bowel or small bowel enteritis. Vascular/Lymphatic: Aortic atherosclerosis. No evidence of aneurysm or adenopathy. Reproductive: No visible focal abnormality. Other: No free fluid or free air. Musculoskeletal: No acute bony abnormality. IMPRESSION: Inflammatory process in the left lower abdomen and upper pelvis. There is diverticular disease in the adjacent descending colon and sigmoid colon as well as small bowel wall thickening and adjacent small bowel loops. It is difficult to determine if this is diverticulitis or small bowel enteritis. Resulting mildly dilated proximal small bowel loops with air-fluid levels could reflect focal ileus or a degree of small-bowel obstruction. Ancillary findings as above. Electronically Signed   By: Rolm Baptise M.D.   On: 10/22/2020 23:51        Scheduled Meds: . apixaban  5 mg Oral BID  . docusate sodium  100 mg Oral BID  . insulin aspart  0-6 Units Subcutaneous Q4H  . ipratropium-albuterol  3 mL Nebulization QID  . metoprolol tartrate  50 mg Oral BID  . pravastatin  40 mg Oral Daily  . senna  1 tablet Oral BID   Continuous Infusions: . sodium chloride Stopped (10/23/20 0529)  . piperacillin-tazobactam (ZOSYN)  IV 3.375 g (10/23/20 0527)     LOS: 0 days    Time spent: 37min    Domenic Polite, MD [facility]  10/23/2020, 11:53 AM

## 2020-10-24 ENCOUNTER — Inpatient Hospital Stay (HOSPITAL_COMMUNITY): Payer: Medicare Other

## 2020-10-24 DIAGNOSIS — I872 Venous insufficiency (chronic) (peripheral): Secondary | ICD-10-CM

## 2020-10-24 DIAGNOSIS — R609 Edema, unspecified: Secondary | ICD-10-CM

## 2020-10-24 LAB — CBC
HCT: 37.8 % — ABNORMAL LOW (ref 39.0–52.0)
Hemoglobin: 12.3 g/dL — ABNORMAL LOW (ref 13.0–17.0)
MCH: 27.9 pg (ref 26.0–34.0)
MCHC: 32.5 g/dL (ref 30.0–36.0)
MCV: 85.7 fL (ref 80.0–100.0)
Platelets: 219 10*3/uL (ref 150–400)
RBC: 4.41 MIL/uL (ref 4.22–5.81)
RDW: 14.9 % (ref 11.5–15.5)
WBC: 11.1 10*3/uL — ABNORMAL HIGH (ref 4.0–10.5)
nRBC: 0 % (ref 0.0–0.2)

## 2020-10-24 LAB — COMPREHENSIVE METABOLIC PANEL
ALT: 12 U/L (ref 0–44)
AST: 28 U/L (ref 15–41)
Albumin: 2.3 g/dL — ABNORMAL LOW (ref 3.5–5.0)
Alkaline Phosphatase: 78 U/L (ref 38–126)
Anion gap: 10 (ref 5–15)
BUN: 32 mg/dL — ABNORMAL HIGH (ref 8–23)
CO2: 23 mmol/L (ref 22–32)
Calcium: 8.3 mg/dL — ABNORMAL LOW (ref 8.9–10.3)
Chloride: 101 mmol/L (ref 98–111)
Creatinine, Ser: 2.28 mg/dL — ABNORMAL HIGH (ref 0.61–1.24)
GFR, Estimated: 30 mL/min — ABNORMAL LOW (ref >60)
Glucose, Bld: 127 mg/dL — ABNORMAL HIGH (ref 70–99)
Potassium: 3.2 mmol/L — ABNORMAL LOW (ref 3.5–5.1)
Sodium: 134 mmol/L — ABNORMAL LOW (ref 135–145)
Total Bilirubin: 0.9 mg/dL (ref 0.3–1.2)
Total Protein: 7.4 g/dL (ref 6.5–8.1)

## 2020-10-24 LAB — GLUCOSE, CAPILLARY
Glucose-Capillary: 128 mg/dL — ABNORMAL HIGH (ref 70–99)
Glucose-Capillary: 116 mg/dL — ABNORMAL HIGH (ref 70–99)
Glucose-Capillary: 166 mg/dL — ABNORMAL HIGH (ref 70–99)
Glucose-Capillary: 154 mg/dL — ABNORMAL HIGH (ref 70–99)
Glucose-Capillary: 142 mg/dL — ABNORMAL HIGH (ref 70–99)
Glucose-Capillary: 159 mg/dL — ABNORMAL HIGH (ref 70–99)

## 2020-10-24 NOTE — Progress Notes (Signed)
Lower extremity venous bilateral study completed.   Please see CV Proc for preliminary results.   Janella Rogala, RDMS, RVT  

## 2020-10-24 NOTE — Progress Notes (Signed)
[facility] Progress Note  Patient: [name]    ZYS:[id number]  DOA: 10/22/2020     Date of Service: the patient was seen and examined on 10/24/2020  Brief hospital course: Past medical history of type II DM, COPD, HFpEF, HTN, CKD 3A, HLD, PAF, multiple myeloma, prostate cancer. 4/4 presents with fall and weakness found to have UTI, diverticulitis and ileus. Currently plan is continue IV antibiotics and monitor for oral intake improvement.  Assessment and Plan: 1.  Sepsis due to UTI Diverticulitis SBO/ileus Lactic acidosis Continue with IV antibiotics. Monitor cultures. Gram-negative rods growing in the urine.  2.  Hypokalemia AKI on CKD 3 Mild hyponatremia-clinically not significant Currently improving. Continue to replace potassium as needed. Continue IV fluids.  3.  Chronic A. fib Chronic HFpEF HTN Monitor for volume overload. Blood pressure controlled.  Rate controlled.  Continue Eliquis.  4.  Type II DM, uncontrolled with hyperglycemia with diabetic neuropathy without long-term insulin use. Blood sugars currently controlled. Monitor.  5.  Smoldering multiple myeloma Follows up with oncology outpatient. Not on any therapy right now. Monitor.  6.  HLD Continue statin  7.  COPD No evidence of exacerbation. Continue inhalers.  Diet: Regular diet DVT Prophylaxis:   Place TED hose Start: 10/22/20 2346 apixaban (ELIQUIS) tablet 5 mg    Advance goals of care discussion: Full code  Family Communication: no family was present at bedside, at the time of interview.   Disposition:  Status is: Inpatient  Remains inpatient appropriate because:Inpatient level of care appropriate due to severity of illness   Dispo: The patient is from: Home              Anticipated d/c is to: SNF              Patient currently is not medically stable to d/c.   Difficult to place patient         Subjective: No nausea no vomiting.  No fever no chills.  No chest  pain.  No abdominal pain.  Reports fatigue and tiredness.  At his baseline ambulates without assistance.  Physical Exam:  General: Appear in mild distress, no Rash; Oral Mucosa Clear, moist. no Abnormal Neck Mass Or lumps, Conjunctiva normal  Cardiovascular: S1 and S2 Present, no Murmur, Respiratory: good respiratory effort, Bilateral Air entry present and CTA, no Crackles, no wheezes Abdomen: Bowel Sound present, Soft and no tenderness Extremities: no Pedal edema Neurology: alert and oriented to time, place, and person affect appropriate. no new focal deficit Gait not checked due to patient safety concerns    Vitals:   10/23/20 1955 10/23/20 2100 10/23/20 2301 10/24/20 0324  BP: (!) 144/73  140/77 135/79  Pulse: 63  67 68  Resp: (!) '21  16 20  ' Temp: 98.5 F (36.9 C)  99.1 F (37.3 C) 98.9 F (37.2 C)  TempSrc: Oral  Oral Axillary  SpO2: 98% 94% 96% 99%    Intake/Output Summary (Last 24 hours) at 10/24/2020 0707 Last data filed at 10/24/2020 0448 Gross per 24 hour  Intake 1113.72 ml  Output 700 ml  Net 413.72 ml   There were no vitals filed for this visit.  Data Reviewed: I have personally reviewed and interpreted daily labs, tele strips, imaging. I reviewed all nursing notes, pharmacy notes, vitals, pertinent old records I have discussed plan of care as described above with RN and patient/family.  CBC: Recent Labs  Lab 10/22/20 1812 10/23/20 0016 10/23/20 0230 10/24/20 0418  WBC 16.0*  --  14.3* 11.1*  NEUTROABS  --   --  12.0*  --   HGB 13.0 12.6* 13.6 12.3*  HCT 40.9 37.0* 41.6 37.8*  MCV 85.7  --  83.7 85.7  PLT 229  --  216 678   Basic Metabolic Panel: Recent Labs  Lab 10/22/20 1822 10/23/20 0016 10/23/20 0230 10/24/20 0418  NA 133* 139 134* 134*  K 3.3* 3.5 3.3* 3.2*  CL 99  --  101 101  CO2 22  --  22 23  GLUCOSE 110*  --  108* 127*  BUN 30*  --  29* 32*  CREATININE 2.64*  --  2.53* 2.28*  CALCIUM 8.6*  --  8.6* 8.3*  MG  --   --  1.7  --    PHOS  --   --  3.0  --     Studies: No results found.  Scheduled Meds: . apixaban  5 mg Oral BID  . docusate sodium  100 mg Oral BID  . feeding supplement  237 mL Oral BID BM  . insulin aspart  0-6 Units Subcutaneous Q4H  . ipratropium-albuterol  3 mL Nebulization BID  . metoprolol tartrate  50 mg Oral BID  . pravastatin  40 mg Oral Daily  . senna  1 tablet Oral BID   Continuous Infusions: . sodium chloride Stopped (10/23/20 0529)  . piperacillin-tazobactam (ZOSYN)  IV 3.375 g (10/24/20 0554)   PRN Meds: acetaminophen **OR** acetaminophen, albuterol, HYDROcodone-acetaminophen, loratadine  Time spent: 35 minutes  Author: Berle Mull, MD [facility] 10/24/2020 7:07 AM  To reach On-call, see care teams to locate the attending and reach out via [email or web address]. Between 7PM-7AM, please contact night-coverage If you still have difficulty reaching the attending provider, please page the [facility] (Director on Call) for [facility] on amion for assistance.

## 2020-10-24 NOTE — NC FL2 (Signed)
[place] MEDICAID FL2 LEVEL OF CARE SCREENING TOOL     IDENTIFICATION  Patient Name: [name] Birthdate: October 15, 1945 Sex: male Admission Date (Current Location): 10/22/2020  [facility] and [place] Number:  Herbalist and Address:  [facility], [place] [address], [place], [place] [address]      Provider Number: [id number]  Attending Physician Name and Address:  Lavina Hamman, MD  Relative Name and Phone Number:       Current Level of Care: Hospital Recommended Level of Care: [place] Prior Approval Number:    Date Approved/Denied:   PASRR Number: [id number]  Discharge Plan: SNF    Current Diagnoses: Patient Active Problem List   Diagnosis Date Noted  . Diverticulitis 10/23/2020  . Fall 10/22/2020  . Sepsis ([place]) 10/22/2020  . Acute lower UTI 10/22/2020  . NSVT (nonsustained ventricular tachycardia) ([place])   . Multiple myeloma ([place])   . AKI (acute kidney injury) ([place]) 02/15/2020  . General weakness   . Diabetic leg ulcer ([place]) 10/06/2019  . CKD (chronic kidney disease) stage 3, GFR 30-59 ml/min (HCC) 10/06/2019  . Diabetic ulcer of right lower leg ([place]) 02/23/2019  . Abnormal LFTs 04/27/2018  . Open wound of scrotum 03/01/2018  . Acute on chronic diastolic CHF (congestive heart failure) ([place]) 02/25/2018  . Postoperative anemia due to acute blood loss 02/25/2018  . Occipital stroke ([place]), left 09/23/2017  . Diabetes mellitus type II, non insulin dependent ([place])   . Encounter for well adult exam with abnormal findings 07/31/2017  . Venous insufficiency (chronic) (peripheral) with chronic lower extremity edema 04/04/2016  . Junctional tachycardia ([place]) 03/18/2015  . Exertional dyspnea 07/05/2014  . Obesity (BMI 30-39.9) 05/04/2014  . Benign essential tremor 05/04/2014  . Peripheral neuropathy 12/21/2013  . Nonspecific abnormal finding in stool contents 09/06/2012  . Heme positive stool 08/09/2012  .  Difficulty passing stool 08/09/2012  . Sebaceous cyst 04/15/2012  . Complex renal cyst 01/13/2011  . Back pain 01/02/2011  . Permanent atrial fibrillation (HCC) - now off amiodarone; asymptomatic. CHA2DS2-VASc Score -5 (on Xarelto) 01/02/2011  . KNEE PAIN, LEFT 08/27/2009  . COLONIC POLYPS, HX OF 08/08/2008  . ERECTILE DYSFUNCTION 09/27/2007  . ULNAR NEUROPATHY 09/27/2007  . VENTRICULAR HYPERTROPHY, LEFT 09/27/2007  . ALLERGIC RHINITIS 09/27/2007  . Arthropathy 09/27/2007  . GANGLION CYST, WRIST, LEFT 09/27/2007  . HEMORRHOIDS, INTERNAL 06/10/2007  . DIVERTICULOSIS, COLON 06/10/2007  . [place] DISEASE, LUMBAR 06/03/2007  . NEPHROLITHIASIS, HX OF 06/03/2007  . Diabetes mellitus with neuropathy ([place]) 02/11/2007  . Hyperlipidemia with target LDL less than 100 02/11/2007  . ANXIETY 02/11/2007  . Nondependent Alcohol Abuse, in Remission 02/11/2007  . Hypertensive heart disease with chronic diastolic congestive heart failure ([place]) 02/11/2007  . COPD (chronic obstructive pulmonary disease) ([place]) 02/11/2007  . BENIGN PROSTATIC HYPERTROPHY 02/11/2007  . LOW BACK PAIN 02/11/2007  . Disturbance of skin sensation 02/11/2007  . PROSTATE CANCER, HX OF 02/11/2007    Orientation RESPIRATION BLADDER Height & Weight     Self,Time,Situation,Place  Normal External catheter,Continent Weight:   Height:     BEHAVIORAL SYMPTOMS/MOOD NEUROLOGICAL BOWEL NUTRITION STATUS      Continent Diet (please see discharge summary)  AMBULATORY STATUS COMMUNICATION OF NEEDS Skin   Supervision Verbally Normal                       Personal Care Assistance Level of Assistance  Bathing,Feeding,Dressing Bathing Assistance: Limited assistance Feeding assistance: Independent Dressing  Assistance: Limited assistance     Functional Limitations Info  Sight,Hearing,Speech Sight Info: Adequate Hearing Info: Adequate Speech Info: Adequate    SPECIAL CARE FACTORS FREQUENCY  PT (By licensed PT),OT (By licensed OT)      PT Frequency: 5x per week OT Frequency: 5x per week            Contractures Contractures Info: Not present    Additional Factors Info  Code Status,Allergies Code Status Info: FULL Allergies Info: Ciprofloxacin           Current Medications (10/24/2020):  This is the current hospital active medication list Current Facility-Administered Medications  Medication Dose Route Frequency Provider Last Rate Last Admin  . 0.9 %  sodium chloride infusion   Intravenous Continuous Toy Baker, MD   Stopped at 10/23/20 0529  . acetaminophen (TYLENOL) tablet 650 mg  650 mg Oral Q6H PRN Toy Baker, MD       Or  . acetaminophen (TYLENOL) suppository 650 mg  650 mg Rectal Q6H PRN Doutova, Anastassia, MD      . albuterol (VENTOLIN HFA) 108 (90 Base) MCG/ACT inhaler 2 puff  2 puff Inhalation Q6H PRN Doutova, Anastassia, MD      . apixaban (ELIQUIS) tablet 5 mg  5 mg Oral BID Toy Baker, MD   5 mg at 10/24/20 0846  . docusate sodium (COLACE) capsule 100 mg  100 mg Oral BID Toy Baker, MD   100 mg at 10/23/20 2119  . feeding supplement (ENSURE ENLIVE / ENSURE PLUS) liquid 237 mL  237 mL Oral BID BM Domenic Polite, MD   237 mL at 10/24/20 0847  . HYDROcodone-acetaminophen (NORCO/VICODIN) 5-325 MG per tablet 1-2 tablet  1-2 tablet Oral Q4H PRN Toy Baker, MD   2 tablet at 10/24/20 0846  . insulin aspart (novoLOG) injection 0-6 Units  0-6 Units Subcutaneous Q4H Toy Baker, MD   1 Units at 10/24/20 1323  . loratadine (CLARITIN) tablet 10 mg  10 mg Oral Daily PRN Doutova, Anastassia, MD      . metoprolol tartrate (LOPRESSOR) tablet 50 mg  50 mg Oral BID Toy Baker, MD   50 mg at 10/24/20 0847  . piperacillin-tazobactam (ZOSYN) IVPB 3.375 g  3.375 g Intravenous Q8H Doutova, Anastassia, MD 12.5 mL/hr at 10/24/20 0554 3.375 g at 10/24/20 0554  . pravastatin (PRAVACHOL) tablet 40 mg  40 mg Oral Daily Doutova, Anastassia, MD   40 mg at 10/23/20  1643  . senna (SENOKOT) tablet 8.6 mg  1 tablet Oral BID Toy Baker, MD   8.6 mg at 10/23/20 2119     Discharge Medications: Please see discharge summary for a list of discharge medications.  Relevant Imaging Results:  Relevant Lab Results:   Additional Information [id number]    Moderna COVID-19 Vaccine 11/17/2019 , 10/20/2019 - patient states he has received the booster shot  Vinie Sill, LCSW

## 2020-10-24 NOTE — TOC Progression Note (Signed)
Transition of Care [facility]) - Progression Note    Patient Details  Name: [name] MRN: [id number] Date of Birth: 04-Jan-1946  Transition of [facility]) CM/SW [place], [place] Phone Number: 10/24/2020, 2:10 PM  Clinical Narrative:     CSW met with patient, patient's daughter and brother to discuss short term rehab at [facility]. After further discussion, patient was agreeable to short term rehab at [facility]. CSW explained the SNF process. All questions answered.   CSW will provide bed offers once available. CSW will continue to follow and assist with discharge planning.   Thurmond Butts, MSW, LCSW Clinical Social Worker        Expected Discharge Plan and Services   In-house Referral: Clinical Social Work                                             Social Determinants of Health (SDOH) Interventions    Readmission Risk Interventions No flowsheet data found.

## 2020-10-24 NOTE — Progress Notes (Signed)
Physical Therapy Treatment Patient Details Name: [name] MRN: [id number] DOB: 11-29-1945 Today's Date: 10/24/2020    History of Present Illness Pt is a 75 y.o. M admitted 4/4 who presents with a fall and AKI on CKD. Significant PMH: DM, COPD. atrial fibrillation, CKD, venous insufficiency.    PT Comments    Pt progressing slowly towards his physical therapy goals. Pt requires bed to be elevated significantly in order to successfully stand; ambulating x 30 feet with a walker and demonstrates slow and effortful gait with decreased bilateral foot clearance and increased trunk flexion. Presents as a high fall risk based on decreased gait speed and history of falls. Discussed with pt daughter, [name], who states she prefers pt d/c to short term SNF. Pt refusing per CSW.    Follow Up Recommendations  SNF; Supervision/Assistance - 24 hour (pt refusing; will need HHPT)     Equipment Recommendations  Wheelchair (measurements PT);Wheelchair cushion (measurements PT) (bariatric)    Recommendations for Other Services       Precautions / Restrictions Precautions Precautions: Fall Restrictions Weight Bearing Restrictions: No    Mobility  Bed Mobility Overal bed mobility: Needs Assistance Bed Mobility: Supine to Sit     Supine to sit: Supervision     General bed mobility comments: Increased time, cues provided for initiation and sequencing. No physical assist    Transfers Overall transfer level: Needs assistance Equipment used: Rolling walker (2 wheeled) Transfers: Sit to/from Stand Sit to Stand: Min assist         General transfer comment: MinA from elevated bed height. Increased time to rise  Ambulation/Gait Ambulation/Gait assistance: Min guard Gait Distance (Feet): 30 Feet Assistive device: Rolling walker (2 wheeled) Gait Pattern/deviations: Step-through pattern;Decreased stride length;Trunk flexed Gait velocity: decreased Gait velocity interpretation: <1.31  ft/sec, indicative of household ambulator General Gait Details: Slow and effortful gait with increased trunk flexion. Cues for keeping feet on inside of walker and walker proximity. Pt unable to significantly correct. Min guard for stabilit   Stairs             Wheelchair Mobility    Modified Rankin (Stroke Patients Only)       Balance Overall balance assessment: Needs assistance Sitting-balance support: Feet supported Sitting balance-Leahy Scale: Good     Standing balance support: Bilateral upper extremity supported Standing balance-Leahy Scale: Poor Standing balance comment: reliant on external support                            Cognition Arousal/Alertness: Awake/alert Behavior During Therapy: WFL for tasks assessed/performed Overall Cognitive Status: Impaired/Different from baseline Area of Impairment: Awareness;Memory;Safety/judgement                     Memory: Decreased short-term memory;Decreased recall of precautions   Safety/Judgement: Decreased awareness of safety;Decreased awareness of deficits Awareness: Emergent          Exercises      General Comments        Pertinent Vitals/Pain Pain Assessment: Faces Faces Pain Scale: Hurts little more Pain Location: back Pain Descriptors / Indicators: Grimacing;Guarding;Sore Pain Intervention(s): Limited activity within patient's tolerance;Monitored during session;Heat applied    Home Living                      Prior Function            PT Goals (current goals can now be found in the care plan  section) Acute Rehab PT Goals Patient Stated Goal: improved strength PT Goal Formulation: With patient Time For Goal Achievement: 11/06/20 Potential to Achieve Goals: Good    Frequency    Min 3X/week      PT Plan Discharge plan needs to be updated    Co-evaluation              AM-PAC PT "6 Clicks" Mobility   Outcome Measure  Help needed turning from your  back to your side while in a flat bed without using bedrails?: None Help needed moving from lying on your back to sitting on the side of a flat bed without using bedrails?: A Little Help needed moving to and from a bed to a chair (including a wheelchair)?: A Little Help needed standing up from a chair using your arms (e.g., wheelchair or bedside chair)?: A Lot Help needed to walk in hospital room?: A Little Help needed climbing 3-5 steps with a railing? : A Lot 6 Click Score: 17    End of Session Equipment Utilized During Treatment: Gait belt Activity Tolerance: Patient tolerated treatment well Patient left: in chair;with call bell/phone within reach;with chair alarm set Nurse Communication: Mobility status PT Visit Diagnosis: Unsteadiness on feet (R26.81);Muscle weakness (generalized) (M62.81);History of falling (Z91.81);Difficulty in walking, not elsewhere classified (R26.2)     Time: 1443-1540 PT Time Calculation (min) (ACUTE ONLY): 26 min  Charges:  $Gait Training: 8-22 mins $Therapeutic Activity: 8-22 mins                     Wyona Almas, PT, DPT Acute Rehabilitation Services Pager [phone] Office [phone]    Deno Etienne 10/24/2020, 12:15 PM

## 2020-10-24 NOTE — TOC Initial Note (Signed)
Transition of Care [facility]) - Initial/Assessment Note    Patient Details  Name: [name] MRN: [id number] Date of Birth: Apr 22, 1946  [facility]) CM/SW Contact:    Vinie Sill, LCSW Phone Number: 10/24/2020, 12:21 PM  Clinical Narrative:                  CSW visit with patient at bedside. CSW introduced self and explained role. CSW discussed PT recommendation of short term rehab. Patient states his daughter and grandson are in the home. Patient acknowledges he needs physical therapy and willing to engage with PT but is firm about not going to SNF for rehab. Patient expressed he is agreeable to [facility] PT. Patient gave CSW permission to call and update his daughter. Patient states he has received covid vaccines and booster shot.   CSW contacted patient's daughter,Karena, she shared, patient would be home alone during the day and was concerned about patient discharging home. She advised,. She will discuss concerns with the patient and follow up with CSW.    CSW will continue to follow and assist with discharge planning.  Thurmond Butts, MSW, LCSW Clinical Social Worker      Patient Goals and CMS Choice        Expected Discharge Plan and Services   In-house Referral: Clinical Social Work                                            Prior Living Arrangements/Services   Lives with:: [place] (adult grandchild) Patient language and need for interpreter reviewed:: No        Need for Family Participation in Patient Care: Yes (Comment) Care giver support system in place?: Yes (comment)   Criminal Activity/Legal Involvement Pertinent to Current Situation/Hospitalization: No - Comment as needed  Activities of Daily Living Home Assistive Devices/Equipment: None ADL Screening (condition at time of admission) Patient's cognitive ability adequate to safely complete daily activities?: No Is the patient deaf or have difficulty hearing?:  No Does the patient have difficulty seeing, even when wearing glasses/contacts?: No Does the patient have difficulty concentrating, remembering, or making decisions?: No Patient able to express need for assistance with ADLs?: Yes Does the patient have difficulty dressing or bathing?: Yes Independently performs ADLs?: No Communication: Independent Does the patient have difficulty walking or climbing stairs?: Yes Weakness of Legs: Both Weakness of Arms/Hands: Both  Permission Sought/Granted Permission sought to share information with : Family Supports Permission granted to share information with : Yes, Verbal Permission Granted  Share Information with NAME: [name] [place]     Permission granted to share info w Relationship: daughter  Permission granted to share info w Contact Information: [phone]  Emotional Assessment Appearance:: Appears stated age Attitude/Demeanor/Rapport: Engaged Affect (typically observed): Appropriate,Accepting,Calm,Pleasant Orientation: : Oriented to Self,Oriented to Place,Oriented to  Time,Oriented to Situation Alcohol / Substance Use: Not Applicable Psych Involvement: No (comment)  Admission diagnosis:  Pyelonephritis [N12] Sepsis ([place]) [A41.9] Patient Active Problem List   Diagnosis Date Noted  . Diverticulitis 10/23/2020  . Fall 10/22/2020  . Sepsis ([place]) 10/22/2020  . Acute lower UTI 10/22/2020  . NSVT (nonsustained ventricular tachycardia) ([place])   . Multiple myeloma ([place])   . AKI (acute kidney injury) ([place]) 02/15/2020  . General weakness   . Diabetic leg ulcer ([place]) 10/06/2019  . CKD (chronic kidney disease) stage 3, GFR 30-59 ml/min (HCC) 10/06/2019  .  Diabetic ulcer of right lower leg ([place]) 02/23/2019  . Abnormal LFTs 04/27/2018  . Open wound of scrotum 03/01/2018  . Acute on chronic diastolic CHF (congestive heart failure) ([place]) 02/25/2018  . Postoperative anemia due to acute blood loss 02/25/2018  . Occipital stroke ([place]), left  09/23/2017  . Diabetes mellitus type II, non insulin dependent ([place])   . Encounter for well adult exam with abnormal findings 07/31/2017  . Venous insufficiency (chronic) (peripheral) with chronic lower extremity edema 04/04/2016  . Junctional tachycardia ([place]) 03/18/2015  . Exertional dyspnea 07/05/2014  . Obesity (BMI 30-39.9) 05/04/2014  . Benign essential tremor 05/04/2014  . Peripheral neuropathy 12/21/2013  . Nonspecific abnormal finding in stool contents 09/06/2012  . Heme positive stool 08/09/2012  . Difficulty passing stool 08/09/2012  . Sebaceous cyst 04/15/2012  . Complex renal cyst 01/13/2011  . Back pain 01/02/2011  . Permanent atrial fibrillation (HCC) - now off amiodarone; asymptomatic. CHA2DS2-VASc Score -5 (on Xarelto) 01/02/2011  . KNEE PAIN, LEFT 08/27/2009  . COLONIC POLYPS, HX OF 08/08/2008  . ERECTILE DYSFUNCTION 09/27/2007  . ULNAR NEUROPATHY 09/27/2007  . VENTRICULAR HYPERTROPHY, LEFT 09/27/2007  . ALLERGIC RHINITIS 09/27/2007  . Arthropathy 09/27/2007  . GANGLION CYST, WRIST, LEFT 09/27/2007  . HEMORRHOIDS, INTERNAL 06/10/2007  . DIVERTICULOSIS, COLON 06/10/2007  . [place] DISEASE, LUMBAR 06/03/2007  . NEPHROLITHIASIS, HX OF 06/03/2007  . Diabetes mellitus with neuropathy ([place]) 02/11/2007  . Hyperlipidemia with target LDL less than 100 02/11/2007  . ANXIETY 02/11/2007  . Nondependent Alcohol Abuse, in Remission 02/11/2007  . Hypertensive heart disease with chronic diastolic congestive heart failure ([place]) 02/11/2007  . COPD (chronic obstructive pulmonary disease) ([place]) 02/11/2007  . BENIGN PROSTATIC HYPERTROPHY 02/11/2007  . LOW BACK PAIN 02/11/2007  . Disturbance of skin sensation 02/11/2007  . PROSTATE CANCER, HX OF 02/11/2007   PCP:  Biagio Borg, MD Pharmacy:   [facility] [address], [address] [place], [place] [address] Phone: [phone] Fax: [phone]  [facility] ([place] - [address] [place]) [place] [address] Phone: [phone] Fax: [phone]     Social Determinants of Health (SDOH) Interventions    Readmission Risk Interventions No flowsheet data found.

## 2020-10-25 ENCOUNTER — Telehealth: Payer: Self-pay | Admitting: Internal Medicine

## 2020-10-25 LAB — GLUCOSE, CAPILLARY
Glucose-Capillary: 153 mg/dL — ABNORMAL HIGH (ref 70–99)
Glucose-Capillary: 145 mg/dL — ABNORMAL HIGH (ref 70–99)
Glucose-Capillary: 175 mg/dL — ABNORMAL HIGH (ref 70–99)
Glucose-Capillary: 106 mg/dL — ABNORMAL HIGH (ref 70–99)
Glucose-Capillary: 113 mg/dL — ABNORMAL HIGH (ref 70–99)
Glucose-Capillary: 124 mg/dL — ABNORMAL HIGH (ref 70–99)

## 2020-10-25 LAB — URINE CULTURE: Culture: >=100000 — AB

## 2020-10-25 MED ORDER — INSULIN ASPART 100 UNIT/ML ~~LOC~~ SOLN: 0.0000 [IU] | Freq: Every day | SUBCUTANEOUS | Status: DC

## 2020-10-25 MED ORDER — INSULIN ASPART 100 UNIT/ML ~~LOC~~ SOLN
0.0000 [IU] | Freq: Three times a day (TID) | SUBCUTANEOUS | Status: DC
  Administered 2020-10-25: 2 [IU] via SUBCUTANEOUS
  Administered 2020-10-26: 1 [IU] via SUBCUTANEOUS

## 2020-10-25 MED ORDER — SODIUM CHLORIDE 0.9 % IV SOLN
1.0000 g | INTRAVENOUS | Status: DC
  Administered 2020-10-25: 1 g via INTRAVENOUS
  Filled 2020-10-25: qty 1

## 2020-10-25 MED ORDER — CEPHALEXIN 500 MG PO CAPS
500.0000 mg | ORAL_CAPSULE | Freq: Two times a day (BID) | ORAL | Status: DC
  Administered 2020-10-25 – 2020-10-26 (×3): 500 mg via ORAL
  Filled 2020-10-25 (×3): qty 1

## 2020-10-25 NOTE — Telephone Encounter (Signed)
Patients daughter calling, states that she has spoken to her fathers admitting doctors and they haven't been much help on getting clarity on her fathers situation. States they are starting him on medicine that he has never had and she would like to speak with Dr. Jenny Reichmann about this.

## 2020-10-25 NOTE — Progress Notes (Signed)
[facility] Progress Note  Patient: [name]    LHT:[id number]  DOA: 10/22/2020     Date of Service: the patient was seen and examined on 10/25/2020  Brief hospital course: Past medical history of type II DM, COPD, HFpEF, HTN, CKD 3A, HLD, PAF, multiple myeloma, prostate cancer. 4/4 presents with fall and weakness found to have UTI, diverticulitis and ileus. Currently plan is continue IV antibiotics and monitor for oral intake improvement.  Assessment and Plan: 1.  Sepsis due to UTI Klebsiella Diverticulitis SBO/ileus Lactic acidosis Continue with antibiotics.  Switch to p.o. Monitor cultures.  2.  Hypokalemia AKI on CKD 3 Mild hyponatremia-clinically not significant Currently improving. Continue to replace potassium as needed. Hold IV fluids.  3.  Chronic A. fib Chronic HFpEF HTN Monitor for volume overload. Blood pressure controlled.  Rate controlled.  Continue Eliquis.  4.  Type II DM, uncontrolled with hyperglycemia with diabetic neuropathy without long-term insulin use. Blood sugars currently controlled. Monitor.  5.  Smoldering multiple myeloma Follows up with oncology outpatient. Not on any therapy right now. Monitor.  6.  HLD Continue statin  7.  COPD No evidence of exacerbation. Continue inhalers.  Diet: Regular diet DVT Prophylaxis:   Place TED hose Start: 10/22/20 2346 apixaban (ELIQUIS) tablet 5 mg    Advance goals of care discussion: Full code  Family Communication: no family was present at bedside, at the time of interview.   Disposition:  Status is: Inpatient  Remains inpatient appropriate because:Inpatient level of care appropriate due to severity of illness   Dispo: The patient is from: Home              Anticipated d/c is to: SNF              Patient currently is not medically stable to d/c.   Difficult to place patient         Subjective: No nausea no vomiting or no fever no chills.  Fatigue remains.  Physical  Exam:  General: Appear in mild distress, no Rash; Oral Mucosa Clear, moist. no Abnormal Neck Mass Or lumps, Conjunctiva normal  Cardiovascular: S1 and S2 Present, no Murmur, Respiratory: good respiratory effort, Bilateral Air entry present and CTA, no Crackles, no wheezes Abdomen: Bowel Sound present, Soft and no tenderness Extremities: bilateral Pedal edema Neurology: alert and oriented to time, place, and person affect appropriate. no new focal deficit Gait not checked due to patient safety concerns  Vitals:   10/25/20 0400 10/25/20 0730 10/25/20 1121 10/25/20 1648  BP: 122/62 140/74 (!) 157/68 (!) 172/81  Pulse:  71 70 84  Resp: 20 (!) '25 14 16  ' Temp: 98.4 F (36.9 C) 98.9 F (37.2 C) 97.8 F (36.6 C) 97.7 F (36.5 C)  TempSrc: Oral Oral Oral Oral  SpO2:  99% 99% 92%    Intake/Output Summary (Last 24 hours) at 10/25/2020 2027 Last data filed at 10/25/2020 0830 Gross per 24 hour  Intake 200 ml  Output 2200 ml  Net -2000 ml   There were no vitals filed for this visit.  Data Reviewed: I have personally reviewed and interpreted daily labs, tele strips, imaging. I reviewed all nursing notes, pharmacy notes, vitals, pertinent old records I have discussed plan of care as described above with RN and patient/family.  CBC: Recent Labs  Lab 10/22/20 1812 10/23/20 0016 10/23/20 0230 10/24/20 0418  WBC 16.0*  --  14.3* 11.1*  NEUTROABS  --   --  12.0*  --   HGB  13.0 12.6* 13.6 12.3*  HCT 40.9 37.0* 41.6 37.8*  MCV 85.7  --  83.7 85.7  PLT 229  --  216 706   Basic Metabolic Panel: Recent Labs  Lab 10/22/20 1822 10/23/20 0016 10/23/20 0230 10/24/20 0418  NA 133* 139 134* 134*  K 3.3* 3.5 3.3* 3.2*  CL 99  --  101 101  CO2 22  --  22 23  GLUCOSE 110*  --  108* 127*  BUN 30*  --  29* 32*  CREATININE 2.64*  --  2.53* 2.28*  CALCIUM 8.6*  --  8.6* 8.3*  MG  --   --  1.7  --   PHOS  --   --  3.0  --     Studies: No results found.  Scheduled Meds: . apixaban  5 mg  Oral BID  . cephALEXin  500 mg Oral Q12H  . docusate sodium  100 mg Oral BID  . feeding supplement  237 mL Oral BID BM  . insulin aspart  0-5 Units Subcutaneous QHS  . insulin aspart  0-9 Units Subcutaneous TID WC  . metoprolol tartrate  50 mg Oral BID  . pravastatin  40 mg Oral Daily   Continuous Infusions:  PRN Meds: acetaminophen **OR** acetaminophen, albuterol, HYDROcodone-acetaminophen, loratadine  Time spent: 35 minutes  Author: Berle Mull, MD [facility] 10/25/2020 8:27 PM  To reach On-call, see care teams to locate the attending and reach out via [email or web address]. Between 7PM-7AM, please contact night-coverage If you still have difficulty reaching the attending provider, please page the [facility] (Director on Call) for [facility] on amion for assistance.

## 2020-10-25 NOTE — TOC Progression Note (Signed)
Transition of Care [facility]) - Progression Note    Patient Details  Name: [name] MRN: [id number] Date of Birth: 06/09/1946  Transition of Care [facility]) CM/SW Contact  Vinie Sill, [place] Phone Number: 10/25/2020, 12:38 PM  Clinical Narrative:     CSW visit with patient. CSW informed the patient of his bed offers. CSW called and advised his daughter of bed offers. She requested CSW inquire with [facility] for possible placement.  Thurmond Butts, MSW, LCSW Clinical Social Worker   Expected Discharge Plan: Skilled Nursing Facility Barriers to Discharge: Insurance Authorization,Continued Medical Work up  Expected Discharge Plan and Services Expected Discharge Plan: [place] In-house Referral: Clinical Social Work                                             Social Determinants of Health (SDOH) Interventions    Readmission Risk Interventions No flowsheet data found.

## 2020-10-25 NOTE — Telephone Encounter (Signed)
Returned daughter call to follow up with what questions she had.   Left a voicemail to call back with specific questions.

## 2020-10-25 NOTE — Progress Notes (Signed)
Physical Therapy Treatment Patient Details Name: [name] MRN: [id number] DOB: 01-03-1946 Today's Date: 10/25/2020    History of Present Illness Pt is a 75 y.o. M admitted 4/4 who presents with a fall and AKI on CKD. Significant PMH: DM, COPD. atrial fibrillation, CKD, venous insufficiency.    PT Comments    The pt was seen for progression of ambulation and strength/power for transfers this afternoon. The pt remains highly motivated to return home eventually, and presented with great engagement in session. The pt was able to demo good improvement in walking distance, but required min/modA to maintain stability at this time in addition to mod cues for posture, positioning in RW, and alignment. The pt has a tendency to drift to the R and ambulate with significant trunk flexion. The pt also completed a series of sit-stand transfers from elevated surface for blocked practice of transfer technique with goal of reducing cues needed for hand placement and technique. However, the pt continues to benefit from mod cues for had positioning each rep with limited carry over between each movement. Will continue to benefit from skilled PT to progress mobility and facilitate return to pt goal of living safely at home.    Follow Up Recommendations  Supervision/Assistance - 24 hour;SNF     Equipment Recommendations  Wheelchair (measurements PT);Wheelchair cushion (measurements PT) (bariatric)    Recommendations for Other Services       Precautions / Restrictions Precautions Precautions: Fall Restrictions Weight Bearing Restrictions: No    Mobility  Bed Mobility Overal bed mobility: Needs Assistance             General bed mobility comments: pt OOB in recliner at start and end of session    Transfers Overall transfer level: Needs assistance Equipment used: Rolling walker (2 wheeled) Transfers: Sit to/from [facility] Sit to Stand: Mod assist;Min assist Stand pivot  transfers: Min assist;Mod assist       General transfer comment: minA from elevated height, modA from recliner. max cues for hand position, control of lower, and not pulling on RW. Pt requried repeated cues each rep, no carryover between reps within session. x8 through session  Ambulation/Gait Ambulation/Gait assistance: Min assist;Mod assist Gait Distance (Feet): 45 Feet Assistive device: Rolling walker (2 wheeled) Gait Pattern/deviations: Step-through pattern;Decreased stride length;Trunk flexed;Wide base of support Gait velocity: decreased Gait velocity interpretation: <1.31 ft/sec, indicative of household ambulator General Gait Details: slow gait with small steps, multiple minor LOB requriring min/modA to steady, pt frequently stepping outside RW with RLE, max cues to maintain position and correct.      Balance Overall balance assessment: Needs assistance Sitting-balance support: Feet supported Sitting balance-Leahy Scale: Good     Standing balance support: Bilateral upper extremity supported Standing balance-Leahy Scale: Poor Standing balance comment: reliant on external support                            Cognition Arousal/Alertness: Awake/alert Behavior During Therapy: WFL for tasks assessed/performed Overall Cognitive Status: Impaired/Different from baseline Area of Impairment: Awareness;Memory;Safety/judgement                     Memory: Decreased short-term memory;Decreased recall of precautions   Safety/Judgement: Decreased awareness of safety;Decreased awareness of deficits Awareness: Emergent   General Comments: pt with decreased awareness, requiring repeated cues for safety and technique throughout session for all mobility. increased cues for posture, hand positioning, transfer technique with little carry over. pt very  apologetic about "not paying attention" or if anything he said made it seem like he was not appreciative of therapy       Exercises Other Exercises Other Exercises: sit-stand from EOB, minA to minG with repeated cues each rep for hand positioning, technique, use of RW    General Comments General comments (skin integrity, edema, etc.): VSS on RA, high risk for falls with decreased safety awareness and poor carryover of cues      Pertinent Vitals/Pain Pain Assessment: Faces Faces Pain Scale: No hurt Pain Intervention(s): Monitored during session           PT Goals (current goals can now be found in the care plan section) Acute Rehab PT Goals Patient Stated Goal: improved strength PT Goal Formulation: With patient Time For Goal Achievement: 11/06/20 Potential to Achieve Goals: Good Progress towards PT goals: Progressing toward goals    Frequency    Min 3X/week      PT Plan Current plan remains appropriate       AM-PAC PT "6 Clicks" Mobility   Outcome Measure  Help needed turning from your back to your side while in a flat bed without using bedrails?: None Help needed moving from lying on your back to sitting on the side of a flat bed without using bedrails?: A Little Help needed moving to and from a bed to a chair (including a wheelchair)?: A Lot Help needed standing up from a chair using your arms (e.g., wheelchair or bedside chair)?: A Lot Help needed to walk in hospital room?: A Lot Help needed climbing 3-5 steps with a railing? : A Lot 6 Click Score: 15    End of Session Equipment Utilized During Treatment: Gait belt Activity Tolerance: Patient tolerated treatment well Patient left: in chair;with call bell/phone within reach;with chair alarm set Nurse Communication: Mobility status PT Visit Diagnosis: Unsteadiness on feet (R26.81);Muscle weakness (generalized) (M62.81);History of falling (Z91.81);Difficulty in walking, not elsewhere classified (R26.2)     Time: 8185-6314 PT Time Calculation (min) (ACUTE ONLY): 36 min  Charges:  $Gait Training: 8-22 mins $Therapeutic  Activity: 8-22 mins                     Karma Ganja, PT, DPT   Acute Rehabilitation Department Pager #: [phone]   Otho Bellows 10/25/2020, 5:35 PM

## 2020-10-25 NOTE — TOC Progression Note (Signed)
Transition of Care [facility]) - Progression Note    Patient Details  Name: [name] MRN: [id number] Date of Birth: 1946-06-05  Transition of Care [facility]) CM/SW [place], [place] Phone Number: 10/25/2020, 5:22 PM  Clinical Narrative:     Family has accepted bed offer with [place] has confirmed bed offer. insurance authorization started   RN updated-covid test requested   Thurmond Butts, MSW, LCSW Clinical Social Worker   Expected Discharge Plan: Skilled Nursing Facility Barriers to Discharge: Insurance Authorization,Continued Medical Work up  Expected Discharge Plan and Services Expected Discharge Plan: [place] In-house Referral: Clinical Social Work                                             Social Determinants of Health (SDOH) Interventions    Readmission Risk Interventions No flowsheet data found.

## 2020-10-25 NOTE — TOC Progression Note (Addendum)
Transition of Care [facility]) - Progression Note    Patient Details  Name: Glennie Rodda MRN: [id number] Date of Birth: June 19, 1946  Transition of Care [facility]) CM/SW [place], [place] Phone Number: 10/25/2020, 12:41 PM  Clinical Narrative:     CSW sent message to [facility]- they have made and confirmed bed  offer- Patient's daughter wants to discuss placement options with family and will call CSW back with SNF choice today.  CSW will continue to follow and assist with discharge planning.  Thurmond Butts, MSW, LCSW Clinical Social Worker   Expected Discharge Plan: Skilled Nursing Facility Barriers to Discharge: Insurance Authorization,Continued Medical Work up  Expected Discharge Plan and Services Expected Discharge Plan: [place] In-house Referral: Clinical Social Work                                             Social Determinants of Health (SDOH) Interventions    Readmission Risk Interventions No flowsheet data found.

## 2020-10-26 DIAGNOSIS — I5032 Chronic diastolic (congestive) heart failure: Secondary | ICD-10-CM | POA: Diagnosis not present

## 2020-10-26 DIAGNOSIS — R2689 Other abnormalities of gait and mobility: Secondary | ICD-10-CM | POA: Diagnosis not present

## 2020-10-26 DIAGNOSIS — C9000 Multiple myeloma not having achieved remission: Secondary | ICD-10-CM | POA: Diagnosis not present

## 2020-10-26 DIAGNOSIS — E782 Mixed hyperlipidemia: Secondary | ICD-10-CM | POA: Diagnosis not present

## 2020-10-26 DIAGNOSIS — R6520 Severe sepsis without septic shock: Secondary | ICD-10-CM | POA: Diagnosis not present

## 2020-10-26 DIAGNOSIS — A4189 Other specified sepsis: Secondary | ICD-10-CM | POA: Diagnosis not present

## 2020-10-26 DIAGNOSIS — Z743 Need for continuous supervision: Secondary | ICD-10-CM | POA: Diagnosis not present

## 2020-10-26 DIAGNOSIS — Z9181 History of falling: Secondary | ICD-10-CM | POA: Diagnosis not present

## 2020-10-26 DIAGNOSIS — R531 Weakness: Secondary | ICD-10-CM | POA: Diagnosis not present

## 2020-10-26 DIAGNOSIS — N390 Urinary tract infection, site not specified: Secondary | ICD-10-CM | POA: Diagnosis not present

## 2020-10-26 DIAGNOSIS — I152 Hypertension secondary to endocrine disorders: Secondary | ICD-10-CM | POA: Diagnosis not present

## 2020-10-26 DIAGNOSIS — I679 Cerebrovascular disease, unspecified: Secondary | ICD-10-CM | POA: Diagnosis not present

## 2020-10-26 DIAGNOSIS — J449 Chronic obstructive pulmonary disease, unspecified: Secondary | ICD-10-CM | POA: Diagnosis not present

## 2020-10-26 DIAGNOSIS — M1710 Unilateral primary osteoarthritis, unspecified knee: Secondary | ICD-10-CM | POA: Diagnosis not present

## 2020-10-26 DIAGNOSIS — I4891 Unspecified atrial fibrillation: Secondary | ICD-10-CM | POA: Diagnosis not present

## 2020-10-26 DIAGNOSIS — R279 Unspecified lack of coordination: Secondary | ICD-10-CM | POA: Diagnosis not present

## 2020-10-26 DIAGNOSIS — R1312 Dysphagia, oropharyngeal phase: Secondary | ICD-10-CM | POA: Diagnosis not present

## 2020-10-26 DIAGNOSIS — N12 Tubulo-interstitial nephritis, not specified as acute or chronic: Secondary | ICD-10-CM | POA: Diagnosis not present

## 2020-10-26 DIAGNOSIS — E1159 Type 2 diabetes mellitus with other circulatory complications: Secondary | ICD-10-CM | POA: Diagnosis not present

## 2020-10-26 DIAGNOSIS — N183 Chronic kidney disease, stage 3 unspecified: Secondary | ICD-10-CM | POA: Diagnosis not present

## 2020-10-26 DIAGNOSIS — R5381 Other malaise: Secondary | ICD-10-CM | POA: Diagnosis not present

## 2020-10-26 DIAGNOSIS — B9689 Other specified bacterial agents as the cause of diseases classified elsewhere: Secondary | ICD-10-CM | POA: Diagnosis not present

## 2020-10-26 DIAGNOSIS — M6281 Muscle weakness (generalized): Secondary | ICD-10-CM | POA: Diagnosis not present

## 2020-10-26 DIAGNOSIS — A419 Sepsis, unspecified organism: Secondary | ICD-10-CM | POA: Diagnosis not present

## 2020-10-26 DIAGNOSIS — M1990 Unspecified osteoarthritis, unspecified site: Secondary | ICD-10-CM | POA: Diagnosis not present

## 2020-10-26 DIAGNOSIS — N179 Acute kidney failure, unspecified: Secondary | ICD-10-CM | POA: Diagnosis not present

## 2020-10-26 DIAGNOSIS — E119 Type 2 diabetes mellitus without complications: Secondary | ICD-10-CM | POA: Diagnosis not present

## 2020-10-26 DIAGNOSIS — R2681 Unsteadiness on feet: Secondary | ICD-10-CM | POA: Diagnosis not present

## 2020-10-26 DIAGNOSIS — R7881 Bacteremia: Secondary | ICD-10-CM | POA: Diagnosis not present

## 2020-10-26 LAB — GLUCOSE, CAPILLARY
Glucose-Capillary: 135 mg/dL — ABNORMAL HIGH (ref 70–99)
Glucose-Capillary: 112 mg/dL — ABNORMAL HIGH (ref 70–99)

## 2020-10-26 LAB — BASIC METABOLIC PANEL
Anion gap: 6 (ref 5–15)
BUN: 17 mg/dL (ref 8–23)
CO2: 24 mmol/L (ref 22–32)
Calcium: 8.6 mg/dL — ABNORMAL LOW (ref 8.9–10.3)
Chloride: 107 mmol/L (ref 98–111)
Creatinine, Ser: 1.69 mg/dL — ABNORMAL HIGH (ref 0.61–1.24)
GFR, Estimated: 42 mL/min — ABNORMAL LOW (ref >60)
Glucose, Bld: 164 mg/dL — ABNORMAL HIGH (ref 70–99)
Potassium: 3.2 mmol/L — ABNORMAL LOW (ref 3.5–5.1)
Sodium: 137 mmol/L (ref 135–145)

## 2020-10-26 LAB — SARS CORONAVIRUS 2 (TAT 6-24 HRS): SARS Coronavirus 2: NEGATIVE

## 2020-10-26 MED ORDER — DOCUSATE SODIUM 100 MG PO CAPS: 100.0000 mg | ORAL_CAPSULE | Freq: Two times a day (BID) | ORAL | 0 refills | Status: DC | PRN

## 2020-10-26 MED ORDER — FUROSEMIDE 40 MG PO TABS: 40.0000 mg | ORAL_TABLET | Freq: Every day | ORAL | 0 refills | Status: DC | PRN

## 2020-10-26 MED ORDER — ENSURE ENLIVE PO LIQD: 237.0000 mL | Freq: Two times a day (BID) | ORAL | 0 refills | Status: DC

## 2020-10-26 MED ORDER — CEPHALEXIN 500 MG PO CAPS: 500.0000 mg | ORAL_CAPSULE | Freq: Two times a day (BID) | ORAL | 0 refills | Status: AC

## 2020-10-26 MED ORDER — POTASSIUM CHLORIDE CRYS ER 20 MEQ PO TBCR
20.0000 meq | EXTENDED_RELEASE_TABLET | Freq: Once | ORAL | Status: DC
  Filled 2020-10-26: qty 1

## 2020-10-26 NOTE — TOC Transition Note (Signed)
Transition of Care [facility]) - CM/SW Discharge Note   Patient Details  Name: [name] MRN: [id number] Date of Birth: [birth year]  Transition of Care [facility]) CM/SW Contact:  Vinie Sill, LCSW Phone Number: 10/26/2020, 2:05 PM   Clinical Narrative:     Patient will Discharge to: [place]  Discharge Date: 10/26/2020 Family Notified: Karena,daughter Transport [id number]  Per MD patient is ready for discharge. RN, patient, and facility notified of discharge. Discharge Summary sent to facility. RN given number for report([phone], room 1107. Ambulance transport requested for patient.   Clinical Social Worker signing off.  Thurmond Butts, MSW, LCSW Clinical Social Worker   Final next level of care: Skilled Nursing Facility Barriers to Discharge: Barriers Resolved   Patient Goals and CMS Choice        Discharge Placement              Patient chooses bed at: [facility] Patient to be transferred to facility by: [place] Name of family member notified: daughter,Karena Patient and family notified of of transfer: 10/26/20  Discharge Plan and Services In-house Referral: Clinical Social Work                                   Social Determinants of Health (SDOH) Interventions     Readmission Risk Interventions No flowsheet data found.

## 2020-10-26 NOTE — Progress Notes (Signed)
Orthopedic Tech Progress Note Patient Details:  Danney Lall 04/16/1946 [id number]  Ortho Devices Type of Ortho Device: Haematologist Ortho Device/Splint Location: BLE Ortho Device/Splint Interventions: Ordered,Application,Adjustment   Post Interventions Patient Tolerated: Well Instructions Provided: Care of device,[place] ambulation with device   Jemila Camille 10/26/2020, 2:29 PM

## 2020-10-26 NOTE — TOC Progression Note (Signed)
Transition of Care [facility]) - Progression Note    Patient Details  Name: [name] MRN: [id number] Date of Birth: [birth year]  Transition of Care [facility]) CM/SW [place], [place] Phone Number: 10/26/2020, 1:41 PM  Clinical Narrative:     Received insurance authorization -reference # [id number] from  04/08-04/12  Thurmond Butts, MSW, LCSW Clinical Social Worker   Expected Discharge Plan: Skilled Nursing Facility Barriers to Discharge: Barriers Resolved  Expected Discharge Plan and Services Expected Discharge Plan: [place] In-house Referral: Clinical Social Work       Expected Discharge Date: 10/26/20                                     Social Determinants of Health (SDOH) Interventions    Readmission Risk Interventions No flowsheet data found.

## 2020-10-26 NOTE — Discharge Summary (Addendum)
[facility] Discharge Summary   Patient: [name] GGE:[id number]  PCP: Biagio Borg, MD  Date of admission: 10/22/2020   Date of discharge:  10/26/2020     Discharge Diagnoses:  Principal diagnosis UTI   Active Problems:   Diabetes mellitus with neuropathy ([place])   COPD (chronic obstructive pulmonary disease) ([place])   Permanent atrial fibrillation ([place]) - now off amiodarone; asymptomatic. CHA2DS2-VASc Score -5 (on Xarelto)   Venous insufficiency (chronic) (peripheral) with chronic lower extremity edema   CKD (chronic kidney disease) stage 3, GFR 30-59 ml/min (HCC)   AKI (acute kidney injury) ([place])   General weakness   Fall   Sepsis ([place])   Acute lower UTI   Diverticulitis   Admitted From: home Disposition:  SNF   Recommendations for Outpatient Follow-up:  1. PCP: please follow up in 1 week, may need sleep study 2. Follow up LABS/TEST:  Sleep study 3. New Meds: keflex 4. Changed meds: lasix PRN 5. Stopped meds: none   Follow-up Information    Biagio Borg, MD. Schedule an appointment as soon as possible for a visit in 1 week(s).   Specialties: Internal Medicine, Radiology Contact information: [place] [address] [phone]              Discharge Instructions    Diet - low sodium heart healthy   Complete by: As directed    Increase activity slowly   Complete by: As directed       Diet recommendation: Cardiac diet  Activity: The patient is advised to gradually reintroduce usual activities, as tolerated  Discharge Condition: stable  Code Status: Full code   History of present illness: As per the H and P dictated on admission, "[place] is a 75 y.o. male with medical history significant of DM2, COPD, HLD, HTN,    Presented with fall from standing he apparently slid down to his knees.  Today no head trauma, has had decreased appetite and leg edema. Pt had kidney failure before presenting same way so family  called PCP who referred them to ER  He has been extra confused lately No recent fever or chills no chest pain or shortness of breath No syncope In the past had similar presentation with UTI  noted urine dark and cloudy Family felt he has been off all [facility] Course:  Summary of his active problems in the hospital is as following. 1.  Sepsis due to UTI Klebsiella ruled in Suspected Diverticulitis, ruled out SBO/ileus ruled out Lactic acidosis resolved  Continue with antibiotics. Switch to p.o. Monitor cultures. Urine growing klebsiella. On keflex.  Tolerating PO diet without any pain.  No diarrhea or constipation   2.  Hypokalemia AKI on CKD 3b resolved  Mild hyponatremia-clinically not significant, resolved Renal function at baseline 1.6-1.8 On presentation creat was 2.5.  Lasix held, now creat is 1.69.  K low but pt and family has concern with h/o hyperkalemia so they are not comfortable with supplement.   3.  Chronic A. fib Chronic HFpEF HTN Monitor for volume overload. Blood pressure controlled without norvasc, so will hold. Continue lopressor.  Rate controlled.  Continue Eliquis. Change lasix to PRN given improving volume status and renal function despite not using lasix.  Family has concern with feel swelling which I think will improve with mobility and Unna boots.   4.  Type II DM, uncontrolled with hyperglycemia with diabetic neuropathy without long-term insulin use. Blood sugars currently controlled. Monitor.  5.  Smoldering multiple myeloma Follows up with oncology outpatient. Not on any therapy right now. Monitor.  6.  HLD Continue statin  7.  COPD No evidence of exacerbation. Continue inhalers.  8. Obesity BMI 35 on 08/23/2020 Excess daytime sleepiness.  Will benefit from outpatient sleep study.   9. Goals of care conversation  Pt wanted to make sure he makes his own medical decision. Recommended pt to d/w PCP for medical power of  attorney, living will documents. Provided a copy of MOST form to read through.  Patient was seen by physical therapy, who recommended SNF. On the day of the discharge the patient's vitals were stable, and no other acute medical condition were reported by patient. The patient was felt safe to be discharge at SNF with Therapy.  Consultants: none Procedures: none  DISCHARGE MEDICATION: Allergies as of 10/26/2020      Reactions   Ciprofloxacin Other (See Comments)   All over weakness      Medication List    STOP taking these medications   amLODipine 10 MG tablet Commonly known as: NORVASC     TAKE these medications   acetaminophen 325 MG tablet Commonly known as: TYLENOL Take 2 tablets (650 mg total) by mouth every 6 (six) hours as needed for mild pain or moderate pain.   apixaban 5 MG Tabs tablet Commonly known as: ELIQUIS Take 1 tablet (5 mg total) by mouth 2 (two) times daily.   cephALEXin 500 MG capsule Commonly known as: KEFLEX Take 1 capsule (500 mg total) by mouth 2 (two) times daily for 5 days.   docusate sodium 100 MG capsule Commonly known as: COLACE Take 1 capsule (100 mg total) by mouth 2 (two) times daily as needed for mild constipation.   feeding supplement Liqd Take 237 mLs by mouth 2 (two) times daily between meals.   furosemide 40 MG tablet Commonly known as: LASIX Take 1 tablet (40 mg total) by mouth daily as needed for fluid or edema. What changed:   when to take this  reasons to take this  additional instructions   loratadine 10 MG tablet Commonly known as: CLARITIN Take 10 mg by mouth daily as needed for allergies.   metoprolol tartrate 50 MG tablet Commonly known as: LOPRESSOR Take 1 tablet (50 mg total) by mouth 2 (two) times daily.   OneTouch Delica Plus [id number] Misc Apply 1 each topically daily.   OneTouch Delica Plus Lancing Misc   polyethylene glycol 17 g packet Commonly known as: MIRALAX / GLYCOLAX Take 17 g by mouth daily as  needed for moderate constipation.   pravastatin 40 MG tablet Commonly known as: PRAVACHOL Take 1 tablet (40 mg total) by mouth daily.   ProAir HFA 108 (90 Base) MCG/ACT inhaler Generic drug: albuterol INHALE 2 PUFFS INTO THE  LUNGS EVERY 6 HOURS AS  NEEDED What changed: reasons to take this       Discharge Exam: There were no vitals filed for this visit. Vitals:   10/26/20 0758 10/26/20 1156  BP: (!) 116/98 (!) 141/68  Pulse: 81 73  Resp: 15 13  Temp: 97.9 F (36.6 C) 97.6 F (36.4 C)  SpO2: 95% 99%   General: Appear in mild distress, no Rash; Oral Mucosa Clear, moist. no Abnormal Neck Mass Or lumps, Conjunctiva normal  Cardiovascular: S1 and S2 Present, no Murmur, Respiratory: good respiratory effort, Bilateral Air entry present and CTA, no Crackles, no wheezes Abdomen: Bowel Sound present, Soft and no tenderness Extremities: bilateral Pedal edema Neurology:  alert and oriented to time, place, and person affect appropriate. no new focal deficit Gait not checked due to patient safety concerns    The results of significant diagnostics from this hospitalization (including imaging, microbiology, ancillary and laboratory) are listed below for reference.    Significant Diagnostic Studies: CT Head Wo Contrast  Result Date: 10/22/2020 CLINICAL DATA:  Fall EXAM: CT HEAD WITHOUT CONTRAST TECHNIQUE: Contiguous axial images were obtained from the base of the skull through the vertex without intravenous contrast. COMPARISON:  02/15/2020 FINDINGS: Brain: Old left occipital infarct, stable. There is atrophy and chronic small vessel disease changes. No acute intracranial abnormality. Specifically, no hemorrhage, hydrocephalus, mass lesion, acute infarction, or significant intracranial injury. Vascular: No hyperdense vessel or unexpected calcification. Skull: No acute calvarial abnormality. Sinuses/Orbits: No acute findings. Mucous retention cyst in the right maxillary sinus. Other: None  IMPRESSION: Atrophy, chronic microvascular disease. No acute intracranial abnormality. Old left occipital infarct. Electronically Signed   By: Rolm Baptise M.D.   On: 10/22/2020 21:21   CT Cervical Spine Wo Contrast  Result Date: 10/22/2020 CLINICAL DATA:  Fall EXAM: CT CERVICAL SPINE WITHOUT CONTRAST TECHNIQUE: Multidetector CT imaging of the cervical spine was performed without intravenous contrast. Multiplanar CT image reconstructions were also generated. COMPARISON:  No subluxation. FINDINGS: Alignment: No subluxation. Skull base and vertebrae: No acute fracture. No primary bone lesion or focal pathologic process. Soft tissues and spinal canal: No prevertebral fluid or swelling. No visible canal hematoma. Disc levels: Diffuse advanced degenerative disc disease and facet disease. Upper chest: No acute findings Other: None IMPRESSION: Diffuse advanced spondylosis.  No acute bony abnormality. Electronically Signed   By: Rolm Baptise M.D.   On: 10/22/2020 21:25   DG Chest Portable 1 View  Result Date: 10/22/2020 CLINICAL DATA:  75 year old male with shortness of breath. EXAM: PORTABLE CHEST 1 VIEW COMPARISON:  Chest radiograph dated 02/18/2020 FINDINGS: Minimal left lung base atelectasis. No focal consolidation, pleural effusion, pneumothorax. Stable cardiomegaly. Atherosclerotic calcification of the aorta. No acute osseous pathology. IMPRESSION: 1. No acute cardiopulmonary process. 2. Stable cardiomegaly. Electronically Signed   By: Anner Crete M.D.   On: 10/22/2020 19:36   CT Renal Stone Study  Result Date: 10/22/2020 CLINICAL DATA:  Fall EXAM: CT ABDOMEN AND PELVIS WITHOUT CONTRAST TECHNIQUE: Multidetector CT imaging of the abdomen and pelvis was performed following the standard protocol without IV contrast. COMPARISON:  None. FINDINGS: Lower chest: Cardiomegaly.  Bibasilar atelectasis.  No effusions. Hepatobiliary: No focal hepatic abnormality. Gallbladder unremarkable. Pancreas: No focal  abnormality or ductal dilatation. Spleen: No focal abnormality.  Normal size. Adrenals/Urinary Tract: 8.6 cm cyst in the upper pole of the left kidney. Numerous bilateral cysts. No hydronephrosis. Mild fullness of the left adrenal gland, stable since prior study, likely hyperplasia. Urinary bladder unremarkable. Stomach/Bowel: Normal appendix. Left colonic diverticulosis. Inflammatory stranding seen in the left lower abdomen and upper pelvis surrounding the distal descending colon as well as adjacent small bowel loops. Some of the small bowel loops appear thick walled. There are mildly prominent proximal small bowel loops with air-fluid levels. This difficult to determine if the inflammatory process is diverticulitis with secondary inflammation of the adjacent small bowel or small bowel enteritis. Vascular/Lymphatic: Aortic atherosclerosis. No evidence of aneurysm or adenopathy. Reproductive: No visible focal abnormality. Other: No free fluid or free air. Musculoskeletal: No acute bony abnormality. IMPRESSION: Inflammatory process in the left lower abdomen and upper pelvis. There is diverticular disease in the adjacent descending colon and sigmoid colon as well as  small bowel wall thickening and adjacent small bowel loops. It is difficult to determine if this is diverticulitis or small bowel enteritis. Resulting mildly dilated proximal small bowel loops with air-fluid levels could reflect focal ileus or a degree of small-bowel obstruction. Ancillary findings as above. Electronically Signed   By: Rolm Baptise M.D.   On: 10/22/2020 23:51   VAS [place] LOWER EXTREMITY VENOUS (DVT)  Result Date: 10/24/2020  Lower Venous DVT Study Indications: Edema, and patient with venous insufficiency and CHF.  Limitations: Bandages. Comparison Study: 02-25-2018 Prior bilateral lower extremity venous was negative                   for DVT. Performing Technologist: Darlin Coco RDMS,RVT  Examination Guidelines: A complete evaluation  includes B-mode imaging, spectral Doppler, color Doppler, and power Doppler as needed of all accessible portions of each vessel. Bilateral testing is considered an integral part of a complete examination. Limited examinations for reoccurring indications may be performed as noted. The reflux portion of the exam is performed with the patient in reverse Trendelenburg.  +---------+---------------+---------+-----------+----------+-------------------+ RIGHT    CompressibilityPhasicitySpontaneityPropertiesThrombus Aging      +---------+---------------+---------+-----------+----------+-------------------+ CFV      Full           Yes      Yes                                      +---------+---------------+---------+-----------+----------+-------------------+ SFJ      Full                                                             +---------+---------------+---------+-----------+----------+-------------------+ FV Prox  Full                                                             +---------+---------------+---------+-----------+----------+-------------------+ FV Mid   Full                                                             +---------+---------------+---------+-----------+----------+-------------------+ FV DistalFull                                                             +---------+---------------+---------+-----------+----------+-------------------+ PFV      Full                                                             +---------+---------------+---------+-----------+----------+-------------------+ POP      Full  Yes      Yes                                      +---------+---------------+---------+-----------+----------+-------------------+ PTV                                                   Not visualized due                                                        to bandaging         +---------+---------------+---------+-----------+----------+-------------------+ PERO                                                  Not visualized due                                                        to bandaging        +---------+---------------+---------+-----------+----------+-------------------+   +---------+---------------+---------+-----------+----------+-------------------+ LEFT     CompressibilityPhasicitySpontaneityPropertiesThrombus Aging      +---------+---------------+---------+-----------+----------+-------------------+ CFV      Full           Yes      Yes                                      +---------+---------------+---------+-----------+----------+-------------------+ SFJ      Full                                                             +---------+---------------+---------+-----------+----------+-------------------+ FV Prox  Full                                                             +---------+---------------+---------+-----------+----------+-------------------+ FV Mid   Full                                                             +---------+---------------+---------+-----------+----------+-------------------+ FV DistalFull                                                             +---------+---------------+---------+-----------+----------+-------------------+  PFV      Full                                                             +---------+---------------+---------+-----------+----------+-------------------+ POP      Full           Yes      Yes                                      +---------+---------------+---------+-----------+----------+-------------------+ PTV                                                   Not visualized due                                                        to bandaging        +---------+---------------+---------+-----------+----------+-------------------+  PERO                                                  Not visualized due                                                        to bandaging        +---------+---------------+---------+-----------+----------+-------------------+     Summary: RIGHT: - There is no evidence of deep vein thrombosis in the lower extremity. However, portions of this examination were limited- see technologist comments above.  - No cystic structure found in the popliteal fossa.  LEFT: - There is no evidence of deep vein thrombosis in the lower extremity. However, portions of this examination were limited- see technologist comments above.  - No cystic structure found in the popliteal fossa.  *See table(s) above for measurements and observations. Electronically signed by Harold Barban MD on 10/24/2020 at 9:58:25 PM.    Final     Microbiology: Recent Results (from the past 240 hour(s))  SARS CORONAVIRUS 2 (TAT 6-24 HRS) Nasopharyngeal Nasopharyngeal Swab     Status: None   Collection Time: 10/22/20 10:14 PM   Specimen: Nasopharyngeal Swab  Result Value Ref Range Status   SARS Coronavirus 2 NEGATIVE NEGATIVE Final    Comment: (NOTE) SARS-CoV-2 target nucleic acids are NOT DETECTED.  The SARS-CoV-2 RNA is generally detectable in upper and lower respiratory specimens during the acute phase of infection. Negative results do not preclude SARS-CoV-2 infection, do not rule out co-infections with other pathogens, and should not be used as the sole basis for treatment or other patient management decisions. Negative results must be combined with clinical observations, patient history, and epidemiological information. The expected result is  Negative.  Fact Sheet for Patients: SugarRoll.be  Fact Sheet for Healthcare Providers: https://www.woods-mathews.com/  This test is not yet approved or cleared by the [place] FDA and  has been authorized for detection and/or  diagnosis of SARS-CoV-2 by FDA under an Emergency Use Authorization (EUA). This EUA will remain  in effect (meaning this test can be used) for the duration of the COVID-19 declaration under Se ction 564(b)(1) of the Act, 21 U.S.C. section 360bbb-3(b)(1), unless the authorization is terminated or revoked sooner.  Performed at [facility], [place] [address], [place], [place] [address]   Culture, blood (x 2)     Status: None (Preliminary result)   Collection Time: 10/22/20 10:30 PM   Specimen: BLOOD LEFT HAND  Result Value Ref Range Status   Specimen Description BLOOD LEFT HAND  Final   Special Requests   Final    BOTTLES DRAWN AEROBIC AND ANAEROBIC Blood Culture results may not be optimal due to an inadequate volume of blood received in culture bottles   Culture   Final    NO GROWTH 4 DAYS Performed at [facility], [place] [address], [place], [place] [address]    Report Status PENDING  Incomplete  Culture, blood (x 2)     Status: None (Preliminary result)   Collection Time: 10/22/20 10:35 PM   Specimen: BLOOD RIGHT HAND  Result Value Ref Range Status   Specimen Description BLOOD RIGHT HAND  Final   Special Requests   Final    BOTTLES DRAWN AEROBIC AND ANAEROBIC Blood Culture results may not be optimal due to an inadequate volume of blood received in culture bottles   Culture   Final    NO GROWTH 4 DAYS Performed at [facility], [place] [address] [place], [place] [address]    Report Status PENDING  Incomplete  Culture, Urine     Status: Abnormal   Collection Time: 10/22/20 10:41 PM   Specimen: Urine, Clean Catch  Result Value Ref Range Status   Specimen Description URINE, CLEAN CATCH  Final   Special Requests   Final    NONE Performed at [facility], [place] [address] [place], [place] [address]    Culture >=100,000 COLONIES/mL KLEBSIELLA PNEUMONIAE (A)  Final   Report Status 10/25/2020 FINAL  Final   Organism ID, Bacteria KLEBSIELLA PNEUMONIAE (A)  Final       Susceptibility   Klebsiella pneumoniae - MIC*    AMPICILLIN >=32 RESISTANT Resistant     CEFAZOLIN <=4 SENSITIVE Sensitive     CEFEPIME <=0.12 SENSITIVE Sensitive     CEFTRIAXONE <=0.25 SENSITIVE Sensitive     CIPROFLOXACIN <=0.25 SENSITIVE Sensitive     GENTAMICIN <=1 SENSITIVE Sensitive     IMIPENEM <=0.25 SENSITIVE Sensitive     NITROFURANTOIN 64 INTERMEDIATE Intermediate     TRIMETH/SULFA <=20 SENSITIVE Sensitive     AMPICILLIN/SULBACTAM 8 SENSITIVE Sensitive     PIP/TAZO <=4 SENSITIVE Sensitive     * >=100,000 COLONIES/mL KLEBSIELLA PNEUMONIAE  MRSA PCR Screening     Status: None   Collection Time: 10/23/20  2:24 AM   Specimen: Nasal Mucosa; Nasopharyngeal  Result Value Ref Range Status   MRSA by PCR NEGATIVE NEGATIVE Final    Comment:        The GeneXpert MRSA Assay (FDA approved for NASAL specimens only), is one component of a comprehensive MRSA colonization surveillance program. It is not intended to diagnose MRSA infection nor to guide or monitor treatment  for MRSA infections. Performed at [facility], [place] [address], [place], [place] [address]   SARS CORONAVIRUS 2 (TAT 6-24 HRS) Nasopharyngeal Nasopharyngeal Swab     Status: None   Collection Time: 10/25/20  7:09 PM   Specimen: Nasopharyngeal Swab  Result Value Ref Range Status   SARS Coronavirus 2 NEGATIVE NEGATIVE Final    Comment: (NOTE) SARS-CoV-2 target nucleic acids are NOT DETECTED.  The SARS-CoV-2 RNA is generally detectable in upper and lower respiratory specimens during the acute phase of infection. Negative results do not preclude SARS-CoV-2 infection, do not rule out co-infections with other pathogens, and should not be used as the sole basis for treatment or other patient management decisions. Negative results must be combined with clinical observations, patient history, and epidemiological information. The expected result is Negative.  Fact Sheet for  Patients: SugarRoll.be  Fact Sheet for Healthcare Providers: https://www.woods-mathews.com/  This test is not yet approved or cleared by the [place] FDA and  has been authorized for detection and/or diagnosis of SARS-CoV-2 by FDA under an Emergency Use Authorization (EUA). This EUA will remain  in effect (meaning this test can be used) for the duration of the COVID-19 declaration under Se ction 564(b)(1) of the Act, 21 U.S.C. section 360bbb-3(b)(1), unless the authorization is terminated or revoked sooner.  Performed at [facility], [place] [address], [place], [place] [address]      Labs: CBC: Recent Labs  Lab 10/22/20 1812 10/23/20 0016 10/23/20 0230 10/24/20 0418  WBC 16.0*  --  14.3* 11.1*  NEUTROABS  --   --  12.0*  --   HGB 13.0 12.6* 13.6 12.3*  HCT 40.9 37.0* 41.6 37.8*  MCV 85.7  --  83.7 85.7  PLT 229  --  216 241   Basic Metabolic Panel: Recent Labs  Lab 10/22/20 1822 10/23/20 0016 10/23/20 0230 10/24/20 0418 10/26/20 1010  NA 133* 139 134* 134* 137  K 3.3* 3.5 3.3* 3.2* 3.2*  CL 99  --  101 101 107  CO2 22  --  '22 23 24  ' GLUCOSE 110*  --  108* 127* 164*  BUN 30*  --  29* 32* 17  CREATININE 2.64*  --  2.53* 2.28* 1.69*  CALCIUM 8.6*  --  8.6* 8.3* 8.6*  MG  --   --  1.7  --   --   PHOS  --   --  3.0  --   --    Liver Function Tests: Recent Labs  Lab 10/22/20 1822 10/23/20 0230 10/24/20 0418  AST 32 40 28  ALT '14 14 12  ' ALKPHOS 101 93 78  BILITOT 1.0 1.1 0.9  PROT 8.5* 8.3* 7.4  ALBUMIN 2.6* 2.7* 2.3*   CBG: Recent Labs  Lab 10/25/20 1649 10/25/20 1927 10/25/20 2303 10/26/20 0756 10/26/20 1154  GLUCAP 106* 113* 124* 135* 112*    Time spent: 35 minutes  Signed:  Berle Mull  [facility]  10/26/2020 12:05 PM

## 2020-10-26 NOTE — Progress Notes (Signed)
Physical Therapy Treatment Patient Details Name: [name] MRN: [id number] DOB: 1945-08-15 Today's Date: 10/26/2020    History of Present Illness Pt is a 75 y.o. M admitted 4/4 who presents with a fall and AKI on CKD. Significant PMH: DM, COPD. atrial fibrillation, CKD, venous insufficiency.    PT Comments    The pt was seen for continued progression of LE strength, power, and stability to allow for improved independence with transfers. The pt was able to complete a significant number of sit-stands from various surfaces this session. He completed x3 sit-stands at a time from each height, with seated rest between each set. The pt progressed from needing minG to stand from elevated height to modA with increased time and use of momentum to power up from low surfaces, even with use of BUE to power up. Continue to recommend skilled therapy to progress functional independence and safety with transfers to allow for safe return home eventually.     Follow Up Recommendations  Supervision/Assistance - 24 hour;SNF     Equipment Recommendations  Wheelchair (measurements PT);Wheelchair cushion (measurements PT)    Recommendations for Other Services       Precautions / Restrictions Precautions Precautions: Fall Precaution Comments: foley Restrictions Weight Bearing Restrictions: No    Mobility  Bed Mobility Overal bed mobility: Needs Assistance             General bed mobility comments: pt OOB in recliner at start and end of session    Transfers Overall transfer level: Needs assistance Equipment used: Rolling walker (2 wheeled) Transfers: Sit to/from [facility] Sit to Stand: Mod assist;Min assist Stand pivot transfers: Min assist;Mod assist       General transfer comment: minA from elevated height, modA to maxA with fatigue and low surface. pt with increased time to power up, poor anterior wt shift , and increased trunk flexion on  standing  Ambulation/Gait Ambulation/Gait assistance: Min assist Gait Distance (Feet): 20 Feet Assistive device: Rolling walker (2 wheeled) Gait Pattern/deviations: Step-through pattern;Decreased stride length;Trunk flexed;Wide base of support Gait velocity: decreased   General Gait Details: slowed gait with small steps, significant trunk flexion, and continued cues for position in RW. multiple minor LOB requiring minA but more stable than last session         Balance Overall balance assessment: Needs assistance Sitting-balance support: Feet supported Sitting balance-Leahy Scale: Good   Postural control: Right lateral lean Standing balance support: Bilateral upper extremity supported Standing balance-Leahy Scale: Poor Standing balance comment: reliant on external support                            Cognition Arousal/Alertness: Awake/alert Behavior During Therapy: WFL for tasks assessed/performed Overall Cognitive Status: Impaired/Different from baseline Area of Impairment: Awareness;Memory;Safety/judgement                     Memory: Decreased short-term memory;Decreased recall of precautions   Safety/Judgement: Decreased awareness of safety;Decreased awareness of deficits Awareness: Emergent          Exercises Other Exercises Other Exercises: sit-stand from EOB and recliner with varying heights for increased challenge. minG with elevated surface to modA from recliner. x3 from 4 different heights total x15 through session    General Comments General comments (skin integrity, edema, etc.): VSS on RA with mobility. increased WOB with sit-stand, but SpO2 > 90% through session      Pertinent Vitals/Pain Pain Assessment: Faces Faces Pain Scale:  Hurts little more Pain Location: back Pain Descriptors / Indicators: Grimacing;Guarding;Sore Pain Intervention(s): Monitored during session;Repositioned           PT Goals (current goals can now be found  in the care plan section) Acute Rehab PT Goals Patient Stated Goal: improved strength PT Goal Formulation: With patient Time For Goal Achievement: 11/06/20 Potential to Achieve Goals: Good Progress towards PT goals: Progressing toward goals    Frequency    Min 3X/week      PT Plan Current plan remains appropriate       AM-PAC PT "6 Clicks" Mobility   Outcome Measure  Help needed turning from your back to your side while in a flat bed without using bedrails?: None Help needed moving from lying on your back to sitting on the side of a flat bed without using bedrails?: A Little Help needed moving to and from a bed to a chair (including a wheelchair)?: A Lot Help needed standing up from a chair using your arms (e.g., wheelchair or bedside chair)?: A Lot Help needed to walk in hospital room?: A Lot Help needed climbing 3-5 steps with a railing? : A Lot 6 Click Score: 15    End of Session Equipment Utilized During Treatment: Gait belt Activity Tolerance: Patient tolerated treatment well Patient left: in chair;with call bell/phone within reach;with chair alarm set Nurse Communication: Mobility status PT Visit Diagnosis: Unsteadiness on feet (R26.81);Muscle weakness (generalized) (M62.81);History of falling (Z91.81);Difficulty in walking, not elsewhere classified (R26.2)     Time: 7473-4037 PT Time Calculation (min) (ACUTE ONLY): 41 min  Charges:  $Gait Training: 8-22 mins $Therapeutic Exercise: 23-37 mins                     Karma Ganja, PT, DPT   Acute Rehabilitation Department Pager #: [phone]   Otho Bellows 10/26/2020, 1:40 PM

## 2020-10-26 NOTE — Care Management Important Message (Signed)
Important Message  Patient Details  Name: [name] MRN: [id number] Date of Birth: 1946/03/11   Medicare Important Message Given:  Yes     Icess Bertoni Montine Circle 10/26/2020, 2:35 PM

## 2020-10-27 LAB — CULTURE, BLOOD (ROUTINE X 2)
Culture: NO GROWTH
Culture: NO GROWTH

## 2020-10-29 ENCOUNTER — Other Ambulatory Visit: Payer: Self-pay | Admitting: Internal Medicine

## 2020-10-29 DIAGNOSIS — M1990 Unspecified osteoarthritis, unspecified site: Secondary | ICD-10-CM | POA: Diagnosis not present

## 2020-10-29 DIAGNOSIS — R7881 Bacteremia: Secondary | ICD-10-CM | POA: Diagnosis not present

## 2020-10-29 DIAGNOSIS — R2681 Unsteadiness on feet: Secondary | ICD-10-CM | POA: Diagnosis not present

## 2020-10-29 DIAGNOSIS — N183 Chronic kidney disease, stage 3 unspecified: Secondary | ICD-10-CM | POA: Diagnosis not present

## 2020-10-29 DIAGNOSIS — E119 Type 2 diabetes mellitus without complications: Secondary | ICD-10-CM | POA: Diagnosis not present

## 2020-10-29 DIAGNOSIS — Z9181 History of falling: Secondary | ICD-10-CM | POA: Diagnosis not present

## 2020-10-29 DIAGNOSIS — I4891 Unspecified atrial fibrillation: Secondary | ICD-10-CM | POA: Diagnosis not present

## 2020-10-29 DIAGNOSIS — M6281 Muscle weakness (generalized): Secondary | ICD-10-CM | POA: Diagnosis not present

## 2020-10-29 DIAGNOSIS — J449 Chronic obstructive pulmonary disease, unspecified: Secondary | ICD-10-CM | POA: Diagnosis not present

## 2020-10-29 DIAGNOSIS — E782 Mixed hyperlipidemia: Secondary | ICD-10-CM | POA: Diagnosis not present

## 2020-10-30 DIAGNOSIS — I5032 Chronic diastolic (congestive) heart failure: Secondary | ICD-10-CM | POA: Diagnosis not present

## 2020-10-30 DIAGNOSIS — I4891 Unspecified atrial fibrillation: Secondary | ICD-10-CM | POA: Diagnosis not present

## 2020-10-30 DIAGNOSIS — I679 Cerebrovascular disease, unspecified: Secondary | ICD-10-CM | POA: Diagnosis not present

## 2020-10-30 DIAGNOSIS — I152 Hypertension secondary to endocrine disorders: Secondary | ICD-10-CM | POA: Diagnosis not present

## 2020-10-30 DIAGNOSIS — N390 Urinary tract infection, site not specified: Secondary | ICD-10-CM | POA: Diagnosis not present

## 2020-10-30 DIAGNOSIS — B9689 Other specified bacterial agents as the cause of diseases classified elsewhere: Secondary | ICD-10-CM | POA: Diagnosis not present

## 2020-10-30 DIAGNOSIS — N183 Chronic kidney disease, stage 3 unspecified: Secondary | ICD-10-CM | POA: Diagnosis not present

## 2020-10-30 DIAGNOSIS — N179 Acute kidney failure, unspecified: Secondary | ICD-10-CM | POA: Diagnosis not present

## 2020-10-30 DIAGNOSIS — R6520 Severe sepsis without septic shock: Secondary | ICD-10-CM | POA: Diagnosis not present

## 2020-10-30 DIAGNOSIS — C9000 Multiple myeloma not having achieved remission: Secondary | ICD-10-CM | POA: Diagnosis not present

## 2020-10-30 DIAGNOSIS — E1159 Type 2 diabetes mellitus with other circulatory complications: Secondary | ICD-10-CM | POA: Diagnosis not present

## 2020-10-30 DIAGNOSIS — A4189 Other specified sepsis: Secondary | ICD-10-CM | POA: Diagnosis not present

## 2020-11-01 ENCOUNTER — Encounter: Payer: Self-pay | Admitting: Internal Medicine

## 2020-11-01 ENCOUNTER — Other Ambulatory Visit: Payer: Self-pay | Admitting: Internal Medicine

## 2020-11-01 ENCOUNTER — Ambulatory Visit (INDEPENDENT_AMBULATORY_CARE_PROVIDER_SITE_OTHER): Payer: Medicare Other | Admitting: Internal Medicine

## 2020-11-01 ENCOUNTER — Other Ambulatory Visit: Payer: Self-pay

## 2020-11-01 VITALS — BP 130/90 | HR 72 | Temp 97.6°F | Resp 18 | Ht 73.0 in

## 2020-11-01 DIAGNOSIS — R7881 Bacteremia: Secondary | ICD-10-CM | POA: Diagnosis not present

## 2020-11-01 DIAGNOSIS — J449 Chronic obstructive pulmonary disease, unspecified: Secondary | ICD-10-CM | POA: Diagnosis not present

## 2020-11-01 DIAGNOSIS — E782 Mixed hyperlipidemia: Secondary | ICD-10-CM | POA: Diagnosis not present

## 2020-11-01 DIAGNOSIS — N390 Urinary tract infection, site not specified: Secondary | ICD-10-CM

## 2020-11-01 DIAGNOSIS — I4891 Unspecified atrial fibrillation: Secondary | ICD-10-CM | POA: Diagnosis not present

## 2020-11-01 DIAGNOSIS — M1990 Unspecified osteoarthritis, unspecified site: Secondary | ICD-10-CM | POA: Diagnosis not present

## 2020-11-01 DIAGNOSIS — Z9181 History of falling: Secondary | ICD-10-CM | POA: Diagnosis not present

## 2020-11-01 DIAGNOSIS — R531 Weakness: Secondary | ICD-10-CM

## 2020-11-01 DIAGNOSIS — M6281 Muscle weakness (generalized): Secondary | ICD-10-CM | POA: Diagnosis not present

## 2020-11-01 DIAGNOSIS — N183 Chronic kidney disease, stage 3 unspecified: Secondary | ICD-10-CM | POA: Diagnosis not present

## 2020-11-01 DIAGNOSIS — N179 Acute kidney failure, unspecified: Secondary | ICD-10-CM

## 2020-11-01 DIAGNOSIS — E119 Type 2 diabetes mellitus without complications: Secondary | ICD-10-CM | POA: Diagnosis not present

## 2020-11-01 DIAGNOSIS — R2681 Unsteadiness on feet: Secondary | ICD-10-CM | POA: Diagnosis not present

## 2020-11-01 LAB — CBC WITH DIFFERENTIAL/PLATELET
Basophils Absolute: 0 10*3/uL (ref 0.0–0.1)
Basophils Relative: 0.6 % (ref 0.0–3.0)
Eosinophils Absolute: 0.1 10*3/uL (ref 0.0–0.7)
Eosinophils Relative: 1.2 % (ref 0.0–5.0)
HCT: 38.6 % — ABNORMAL LOW (ref 39.0–52.0)
Hemoglobin: 12.6 g/dL — ABNORMAL LOW (ref 13.0–17.0)
Lymphocytes Relative: 18.9 % (ref 12.0–46.0)
Lymphs Abs: 1.5 10*3/uL (ref 0.7–4.0)
MCHC: 32.7 g/dL (ref 30.0–36.0)
MCV: 84.3 fl (ref 78.0–100.0)
Monocytes Absolute: 0.7 10*3/uL (ref 0.1–1.0)
Monocytes Relative: 8.9 % (ref 3.0–12.0)
Neutro Abs: 5.6 10*3/uL (ref 1.4–7.7)
Neutrophils Relative %: 70.4 % (ref 43.0–77.0)
Platelets: 328 10*3/uL (ref 150.0–400.0)
RBC: 4.57 Mil/uL (ref 4.22–5.81)
RDW: 15.6 % — ABNORMAL HIGH (ref 11.5–15.5)
WBC: 8 10*3/uL (ref 4.0–10.5)

## 2020-11-01 LAB — HEPATIC FUNCTION PANEL
ALT: 12 U/L (ref 0–53)
AST: 20 U/L (ref 0–37)
Albumin: 3 g/dL — ABNORMAL LOW (ref 3.5–5.2)
Alkaline Phosphatase: 93 U/L (ref 39–117)
Bilirubin, Direct: 0.2 mg/dL (ref 0.0–0.3)
Total Bilirubin: 0.3 mg/dL (ref 0.2–1.2)
Total Protein: 8.4 g/dL — ABNORMAL HIGH (ref 6.0–8.3)

## 2020-11-01 LAB — URINALYSIS, ROUTINE W REFLEX MICROSCOPIC
Bilirubin Urine: NEGATIVE
Ketones, ur: NEGATIVE
Leukocytes,Ua: NEGATIVE
Nitrite: NEGATIVE
Specific Gravity, Urine: 1.015 (ref 1.000–1.030)
Total Protein, Urine: >=300 — AB
Urine Glucose: NEGATIVE
Urobilinogen, UA: 0.2 (ref 0.0–1.0)
pH: 6 (ref 5.0–8.0)

## 2020-11-01 LAB — BASIC METABOLIC PANEL
BUN: 16 mg/dL (ref 6–23)
CO2: 28 mEq/L (ref 19–32)
Calcium: 8.9 mg/dL (ref 8.4–10.5)
Chloride: 104 mEq/L (ref 96–112)
Creatinine, Ser: 1.63 mg/dL — ABNORMAL HIGH (ref 0.40–1.50)
GFR: 41.43 mL/min — ABNORMAL LOW (ref >60.00)
Glucose, Bld: 78 mg/dL (ref 70–99)
Potassium: 3 mEq/L — ABNORMAL LOW (ref 3.5–5.1)
Sodium: 138 mEq/L (ref 135–145)

## 2020-11-01 MED ORDER — POTASSIUM CHLORIDE ER 10 MEQ PO TBCR: 10.0000 meq | EXTENDED_RELEASE_TABLET | Freq: Every day | ORAL | 3 refills | Status: DC

## 2020-11-01 NOTE — Patient Instructions (Addendum)
Ok to stop the [employer] for [employer] for your daughter  Dennis Bast will be contacted regarding the referral for: Urology to see if there is any factor that can be improved so that you hopefully dont have any further Urinary Tract Infections - Dr Alinda Money  Please continue all other medications as before, and refills have been done if requested.  Please have the pharmacy call with any other refills you may need.  Please keep your appointments with your specialists as you may have planned  Please go to the LAB at the blood drawing area for the tests to be done  You will be contacted by phone if any changes need to be made immediately.  Otherwise, you will receive a letter about your results with an explanation, but please check with MyChart first.  Please remember to sign up for MyChart if you have not done so, as this will be important to you in the future with finding out test results, communicating by private email, and scheduling acute appointments online when needed.  Please make an Appointment to return in 6 weeks

## 2020-11-01 NOTE — Progress Notes (Signed)
Patient ID: [name], male   DOB: July 14, 1946, 75 y.o.   MRN: [id number]        Chief Complaint: follow up recent hospn with UTI sepsis, AKI on CKD, weakness and fall       HPI:  [name] is a 75 y.o. male here with daughter for above; overall much improved now at [facility] to finish his short rehab stay x 2 wks before going home; in wheelchair today but can stand and walk with walker over 50 ft; Denies urinary symptoms such as dysuria, frequency, urgency, flank pain, hematuria or n/v, fever, chills.  Pt denies chest pain, increased sob or doe, wheezing, orthopnea, PND, increased LE swelling, palpitations, dizziness or syncope.   Pt denies polydipsia, polyuria, Denies any new neuro s/s.      No further falls.  Denies daytime hypersomnolence though does snore, declines sleep study referral today.  Finished keflex x 2 days ago after total 10 days antibiotic.  NP at [facility] has started him on flomax 2 days ago for unclear reason and pt did not realize was taking, and pt is s/p prostatectomy for prostate cancer.  D/c summary mentions lasix prn, but pt has lasix 40 qd listed on med list from [facility] as he is taking scheduled.  Denies worsening weakness or orthostasis.  No other new complaints.   Wt Readings from Last 3 Encounters:  08/23/20 270 lb 12.8 oz (122.8 kg)  03/29/20 249 lb 4.8 oz (113.1 kg)  03/27/20 238 lb 9.6 oz (108.2 kg)   BP Readings from Last 3 Encounters:  11/01/20 130/90  10/26/20 (!) 141/68  10/22/20 122/70         Past Medical History:  Diagnosis Date  . Adenoma 05/2008  . Alcoholism in recovery ([place])   . BPH (benign prostatic hyperplasia)   . Cancer [facility])    Prostate  . Chronic LBP    Hip & Back -- Sees Dr. Maia Petties @ [place]  . Colon polyps   . Complex renal cyst 12/2010  . Controlled type 2 diabetes mellitus with neuropathy ([place]) 01/2007  . COPD (chronic obstructive pulmonary disease) ([place])   . Diabetes ([place])   .  Diverticulitis of colon   . ED (erectile dysfunction)    s/p Penile prosthesis (09/2010)  . History of prostate cancer    Dr. Karsten Ro  . History of sick sinus syndrome    reduced BB dose for Bradycardia  . HLD (hyperlipidemia)   . Hypertension   . Impaired glucose tolerance 12/21/2013  . Nephrolithiasis   . Osteoarthritis of both hips   . Osteoarthritis of lumbosacral spine    with Disc Disease  . PAF (paroxysmal atrial fibrillation) ([facility])    No longer on Amiodarone.  Not on Anticoaguation b/c no recurrence.  . Paresthesias/numbness    Bilateral LE  . Peripheral neuropathy 12/21/2013   Past Surgical History:  Procedure Laterality Date  . LEFT HEART CATH AND CORONARY ANGIOGRAPHY  2003   Normal Coronary Arteries.  Marland Kitchen NM MYOVIEW LTD  02/21/2020   EF 60%.  Medium sized mild severity defect in the basal inferior, mid inferior and apical inferior location-suggesting of ischemia.  Read as low-intermediate risk.  (On cardiology review this is felt to be a fixed defect either related to prior infarct versus diaphragmatic attenuation.  Felt to be low risk)  . PENILE PROSTHESIS IMPLANT  06/21/2012   Procedure: PENILE PROTHESIS INFLATABLE;  Surgeon: Claybon Jabs, MD;  Location: [facility];  Service: Urology;  Laterality: N/A;  REMOVAL AND REPLACEMENT OF SOME  OF PROSTHESIS ([facility])   . PENILE PROSTHESIS PLACEMENT  09/2010  . PROSTATECTOMY    . REMOVAL OF PENILE PROSTHESIS N/A 02/15/2018   Procedure: REMOVAL OF PENILE PROSTHESIS;  Surgeon: Kathie Rhodes, MD;  Location: [facility];  Service: Urology;  Laterality: N/A;  . TRANSTHORACIC ECHOCARDIOGRAM  04/2016   a) Relatively normal EF of 55-60%. No RWMA suggesting no prior MI. Gr 2 DD (pseudo-normal filling pattern) along with moderately dilated left atrium.;; b) 02/2018: Mod LVH. EF 65-70%. No RWMA.  Unable to assess DF 2/2 Afib. Mild MR. Severe BiAtrial Enlargement. High CVP.  Marland Kitchen TRANSTHORACIC ECHOCARDIOGRAM  02/17/2020   EF 60 to 65%.  No R  WMA.  Unable to assess diastolic parameters because of A. fib.  Mildly elevated PA pressures.  Mild LA dilation.  Mild aortic valve sclerosis but no stenosis.  Normal IVC.    reports that he quit smoking about 38 years ago. His smoking use included cigarettes. He has never used smokeless tobacco. He reports current alcohol use. He reports that he does not use drugs. family history includes Coronary artery disease in his father and mother; Diabetes in his father; Heart attack in his mother; Prostate cancer in his father. Allergies  Allergen Reactions  . Ciprofloxacin Other (See Comments)    All over weakness   Current Outpatient Medications on File Prior to Visit  Medication Sig Dispense Refill  . acetaminophen (TYLENOL) 325 MG tablet Take 2 tablets (650 mg total) by mouth every 6 (six) hours as needed for mild pain or moderate pain.    Marland Kitchen apixaban (ELIQUIS) 5 MG TABS tablet Take 1 tablet (5 mg total) by mouth 2 (two) times daily. 60 tablet 0  . docusate sodium (COLACE) 100 MG capsule Take 1 capsule (100 mg total) by mouth 2 (two) times daily as needed for mild constipation. 10 capsule 0  . furosemide (LASIX) 40 MG tablet Take 1 tablet (40 mg total) by mouth daily as needed for fluid or edema. 30 tablet 0  . Lancet Devices (ONETOUCH DELICA PLUS LANCING) MISC     . Lancets (ONETOUCH DELICA PLUS [id number]) MISC USE ONCE DAILY 100 each 3  . loratadine (CLARITIN) 10 MG tablet Take 10 mg by mouth daily as needed for allergies.    . metoprolol tartrate (LOPRESSOR) 50 MG tablet Take 1 tablet (50 mg total) by mouth 2 (two) times daily. 180 tablet 3  . polyethylene glycol (MIRALAX / GLYCOLAX) 17 g packet Take 17 g by mouth daily as needed for moderate constipation. 7 each 0  . pravastatin (PRAVACHOL) 40 MG tablet Take 1 tablet (40 mg total) by mouth daily. 90 tablet 3  . PROAIR HFA 108 (90 Base) MCG/ACT inhaler INHALE 2 PUFFS INTO THE  LUNGS EVERY 6 HOURS AS  NEEDED (Patient taking differently: Inhale 2  puffs into the lungs every 6 (six) hours as needed for shortness of breath or wheezing.) 34 g 3   No current facility-administered medications on file prior to visit.        ROS:  All others reviewed and negative.  Objective        PE:  BP 130/90   Pulse 72   Temp 97.6 F (36.4 C) (Oral)   Resp 18   Ht 6\' 1"  (1.854 m)   SpO2 97%   BMI 35.73 kg/m  Constitutional: Pt appears in NAD               HENT: Head: NCAT.                Right Ear: External ear normal.                 Left Ear: External ear normal.                Eyes: . Pupils are equal, round, and reactive to light. Conjunctivae and EOM are normal               Nose: without d/c or deformity               Neck: Neck supple. Gross normal ROM               Cardiovascular: Normal rate and regular rhythm.                 Pulmonary/Chest: Effort normal and breath sounds without rales or wheezing.                Abd:  Soft, NT, ND, + BS, no organomegaly               Neurological: Pt is alert. At baseline orientation, motor grossly intact               Skin: Skin is warm. No rashes, no other new lesions, LE edema - trace RLE               Psychiatric: Pt behavior is normal without agitation   Micro: none  Cardiac tracings I have personally interpreted today:  none  Pertinent Radiological findings (summarize): none   Lab Results  Component Value Date   WBC 8.0 11/01/2020   HGB 12.6 (L) 11/01/2020   HCT 38.6 (L) 11/01/2020   PLT 328.0 11/01/2020   GLUCOSE 78 11/01/2020   CHOL 142 08/23/2020   TRIG 62 08/23/2020   HDL 41 08/23/2020   LDLCALC 88 08/23/2020   ALT 12 11/01/2020   AST 20 11/01/2020   NA 138 11/01/2020   K 3.0 (L) 11/01/2020   CL 104 11/01/2020   CREATININE 1.63 (H) 11/01/2020   BUN 16 11/01/2020   CO2 28 11/01/2020   TSH 1.374 10/23/2020   PSA 0.00 (L) 10/06/2019   INR 1.63 (H) 08/26/2010   HGBA1C 6.7 (H) 10/22/2020   MICROALBUR 1.8 10/06/2019   Assessment/Plan:  Emmanuelle  Phifer is a 75 y.o. Black or African American [2] male with  has a past medical history of Adenoma (05/2008), Alcoholism in recovery [facility]), BPH (benign prostatic hyperplasia), Cancer ([place]), Chronic LBP, Colon polyps, Complex renal cyst (12/2010), Controlled type 2 diabetes mellitus with neuropathy ([place]) (01/2007), COPD (chronic obstructive pulmonary disease) ([place]), Diabetes ([place]), Diverticulitis of colon, ED (erectile dysfunction), History of prostate cancer, History of sick sinus syndrome, HLD (hyperlipidemia), Hypertension, Impaired glucose tolerance (12/21/2013), Nephrolithiasis, Osteoarthritis of both hips, Osteoarthritis of lumbosacral spine, PAF (paroxysmal atrial fibrillation) ([place]), Paresthesias/numbness, and Peripheral neuropathy (12/21/2013).  Acute lower UTI With sepsis initially now resolved symptoms, finished 10 days total antibx, pt requests f/u UA, also to d/c flomax, but refer urology to look for cause of UTI if possible  AKI (acute kidney injury) ([place]) Also will need f/u renal fxn though did seem back to baseline at d/c; pt has been on scheduled lasix instead of prn,  to f/u any worsening symptoms or concerns  General weakness For lab  f/u today, also cont PT at [facility], then home soon  Followup: Return in about 6 weeks (around 12/13/2020).  Cathlean Cower, MD 11/02/2020 2:54 PM [facility]

## 2020-11-02 ENCOUNTER — Encounter: Payer: Self-pay | Admitting: Internal Medicine

## 2020-11-02 LAB — URINE CULTURE: Result:: NO GROWTH

## 2020-11-02 NOTE — Assessment & Plan Note (Addendum)
With sepsis initially now resolved symptoms, finished 10 days total antibx, pt requests f/u UA, also to d/c flomax, but refer urology to look for cause of UTI if possible

## 2020-11-02 NOTE — Assessment & Plan Note (Signed)
Also will need f/u renal fxn though did seem back to baseline at d/c; pt has been on scheduled lasix instead of prn,  to f/u any worsening symptoms or concerns

## 2020-11-02 NOTE — Assessment & Plan Note (Signed)
For lab f/u today, also cont PT at [facility], then home soon

## 2020-11-05 DIAGNOSIS — R7881 Bacteremia: Secondary | ICD-10-CM | POA: Diagnosis not present

## 2020-11-05 DIAGNOSIS — M1990 Unspecified osteoarthritis, unspecified site: Secondary | ICD-10-CM | POA: Diagnosis not present

## 2020-11-05 DIAGNOSIS — I4891 Unspecified atrial fibrillation: Secondary | ICD-10-CM | POA: Diagnosis not present

## 2020-11-05 DIAGNOSIS — J449 Chronic obstructive pulmonary disease, unspecified: Secondary | ICD-10-CM | POA: Diagnosis not present

## 2020-11-05 DIAGNOSIS — R2681 Unsteadiness on feet: Secondary | ICD-10-CM | POA: Diagnosis not present

## 2020-11-05 DIAGNOSIS — M6281 Muscle weakness (generalized): Secondary | ICD-10-CM | POA: Diagnosis not present

## 2020-11-05 DIAGNOSIS — N183 Chronic kidney disease, stage 3 unspecified: Secondary | ICD-10-CM | POA: Diagnosis not present

## 2020-11-05 DIAGNOSIS — Z9181 History of falling: Secondary | ICD-10-CM | POA: Diagnosis not present

## 2020-11-05 DIAGNOSIS — E782 Mixed hyperlipidemia: Secondary | ICD-10-CM | POA: Diagnosis not present

## 2020-11-05 DIAGNOSIS — E119 Type 2 diabetes mellitus without complications: Secondary | ICD-10-CM | POA: Diagnosis not present

## 2020-11-05 NOTE — Telephone Encounter (Signed)
Forms have been completed & Placed in providers box to review and sign.  

## 2020-11-06 DIAGNOSIS — N390 Urinary tract infection, site not specified: Secondary | ICD-10-CM | POA: Diagnosis not present

## 2020-11-06 DIAGNOSIS — R6520 Severe sepsis without septic shock: Secondary | ICD-10-CM | POA: Diagnosis not present

## 2020-11-06 DIAGNOSIS — I4891 Unspecified atrial fibrillation: Secondary | ICD-10-CM | POA: Diagnosis not present

## 2020-11-06 DIAGNOSIS — M1710 Unilateral primary osteoarthritis, unspecified knee: Secondary | ICD-10-CM | POA: Diagnosis not present

## 2020-11-06 NOTE — Telephone Encounter (Signed)
Forms have been signed, Faxed to [employer] @[phone], Copy sent to scan &Charged for under [name] (Daughter).  [name] has been informed and will pick up original.

## 2020-11-08 DIAGNOSIS — E782 Mixed hyperlipidemia: Secondary | ICD-10-CM | POA: Diagnosis not present

## 2020-11-08 DIAGNOSIS — N183 Chronic kidney disease, stage 3 unspecified: Secondary | ICD-10-CM | POA: Diagnosis not present

## 2020-11-08 DIAGNOSIS — Z9181 History of falling: Secondary | ICD-10-CM | POA: Diagnosis not present

## 2020-11-08 DIAGNOSIS — M1710 Unilateral primary osteoarthritis, unspecified knee: Secondary | ICD-10-CM | POA: Diagnosis not present

## 2020-11-08 DIAGNOSIS — R6520 Severe sepsis without septic shock: Secondary | ICD-10-CM | POA: Diagnosis not present

## 2020-11-08 DIAGNOSIS — M6281 Muscle weakness (generalized): Secondary | ICD-10-CM | POA: Diagnosis not present

## 2020-11-08 DIAGNOSIS — E119 Type 2 diabetes mellitus without complications: Secondary | ICD-10-CM | POA: Diagnosis not present

## 2020-11-08 DIAGNOSIS — R2681 Unsteadiness on feet: Secondary | ICD-10-CM | POA: Diagnosis not present

## 2020-11-08 DIAGNOSIS — M1990 Unspecified osteoarthritis, unspecified site: Secondary | ICD-10-CM | POA: Diagnosis not present

## 2020-11-08 DIAGNOSIS — I4891 Unspecified atrial fibrillation: Secondary | ICD-10-CM | POA: Diagnosis not present

## 2020-11-08 DIAGNOSIS — R7881 Bacteremia: Secondary | ICD-10-CM | POA: Diagnosis not present

## 2020-11-08 DIAGNOSIS — J449 Chronic obstructive pulmonary disease, unspecified: Secondary | ICD-10-CM | POA: Diagnosis not present

## 2020-11-08 DIAGNOSIS — N390 Urinary tract infection, site not specified: Secondary | ICD-10-CM | POA: Diagnosis not present

## 2020-11-12 ENCOUNTER — Telehealth: Payer: Self-pay | Admitting: Internal Medicine

## 2020-11-12 DIAGNOSIS — E785 Hyperlipidemia, unspecified: Secondary | ICD-10-CM | POA: Diagnosis not present

## 2020-11-12 DIAGNOSIS — M1990 Unspecified osteoarthritis, unspecified site: Secondary | ICD-10-CM | POA: Diagnosis not present

## 2020-11-12 DIAGNOSIS — N183 Chronic kidney disease, stage 3 unspecified: Secondary | ICD-10-CM | POA: Diagnosis not present

## 2020-11-12 DIAGNOSIS — I480 Paroxysmal atrial fibrillation: Secondary | ICD-10-CM | POA: Diagnosis not present

## 2020-11-12 DIAGNOSIS — M179 Osteoarthritis of knee, unspecified: Secondary | ICD-10-CM | POA: Diagnosis not present

## 2020-11-12 MED ORDER — TRAZODONE HCL 50 MG PO TABS: 25.0000 mg | ORAL_TABLET | Freq: Every evening | ORAL | 3 refills | Status: DC | PRN

## 2020-11-12 NOTE — Telephone Encounter (Signed)
   Patient calling to report he is having trouble sleeping  He would like advice regarding sleeping pill

## 2020-11-12 NOTE — Telephone Encounter (Signed)
Ok I have sent trazodone to try at bedtime to [facility]

## 2020-11-13 NOTE — Telephone Encounter (Signed)
Patient notified and verbalized understanding. 

## 2020-11-14 DIAGNOSIS — I5032 Chronic diastolic (congestive) heart failure: Secondary | ICD-10-CM | POA: Diagnosis not present

## 2020-11-14 DIAGNOSIS — A4189 Other specified sepsis: Secondary | ICD-10-CM | POA: Diagnosis not present

## 2020-11-14 DIAGNOSIS — I130 Hypertensive heart and chronic kidney disease with heart failure and stage 1 through stage 4 chronic kidney disease, or unspecified chronic kidney disease: Secondary | ICD-10-CM | POA: Diagnosis not present

## 2020-11-14 DIAGNOSIS — M179 Osteoarthritis of knee, unspecified: Secondary | ICD-10-CM | POA: Diagnosis not present

## 2020-11-14 DIAGNOSIS — E1122 Type 2 diabetes mellitus with diabetic chronic kidney disease: Secondary | ICD-10-CM | POA: Diagnosis not present

## 2020-11-14 DIAGNOSIS — J449 Chronic obstructive pulmonary disease, unspecified: Secondary | ICD-10-CM | POA: Diagnosis not present

## 2020-11-14 DIAGNOSIS — Z96652 Presence of left artificial knee joint: Secondary | ICD-10-CM | POA: Diagnosis not present

## 2020-11-14 DIAGNOSIS — K5730 Diverticulosis of large intestine without perforation or abscess without bleeding: Secondary | ICD-10-CM | POA: Diagnosis not present

## 2020-11-14 DIAGNOSIS — Z87891 Personal history of nicotine dependence: Secondary | ICD-10-CM | POA: Diagnosis not present

## 2020-11-14 DIAGNOSIS — E1140 Type 2 diabetes mellitus with diabetic neuropathy, unspecified: Secondary | ICD-10-CM | POA: Diagnosis not present

## 2020-11-14 DIAGNOSIS — C9000 Multiple myeloma not having achieved remission: Secondary | ICD-10-CM | POA: Diagnosis not present

## 2020-11-14 DIAGNOSIS — I872 Venous insufficiency (chronic) (peripheral): Secondary | ICD-10-CM | POA: Diagnosis not present

## 2020-11-14 DIAGNOSIS — I480 Paroxysmal atrial fibrillation: Secondary | ICD-10-CM | POA: Diagnosis not present

## 2020-11-14 DIAGNOSIS — M47812 Spondylosis without myelopathy or radiculopathy, cervical region: Secondary | ICD-10-CM | POA: Diagnosis not present

## 2020-11-14 DIAGNOSIS — N200 Calculus of kidney: Secondary | ICD-10-CM | POA: Diagnosis not present

## 2020-11-14 DIAGNOSIS — N183 Chronic kidney disease, stage 3 unspecified: Secondary | ICD-10-CM | POA: Diagnosis not present

## 2020-11-14 DIAGNOSIS — Z9181 History of falling: Secondary | ICD-10-CM | POA: Diagnosis not present

## 2020-11-14 DIAGNOSIS — M519 Unspecified thoracic, thoracolumbar and lumbosacral intervertebral disc disorder: Secondary | ICD-10-CM | POA: Diagnosis not present

## 2020-11-14 DIAGNOSIS — Z7901 Long term (current) use of anticoagulants: Secondary | ICD-10-CM | POA: Diagnosis not present

## 2020-11-14 DIAGNOSIS — E785 Hyperlipidemia, unspecified: Secondary | ICD-10-CM | POA: Diagnosis not present

## 2020-11-14 DIAGNOSIS — Z8673 Personal history of transient ischemic attack (TIA), and cerebral infarction without residual deficits: Secondary | ICD-10-CM | POA: Diagnosis not present

## 2020-11-14 DIAGNOSIS — N390 Urinary tract infection, site not specified: Secondary | ICD-10-CM | POA: Diagnosis not present

## 2020-11-15 DIAGNOSIS — N281 Cyst of kidney, acquired: Secondary | ICD-10-CM | POA: Diagnosis not present

## 2020-11-15 DIAGNOSIS — K5730 Diverticulosis of large intestine without perforation or abscess without bleeding: Secondary | ICD-10-CM | POA: Diagnosis not present

## 2020-11-15 DIAGNOSIS — M47812 Spondylosis without myelopathy or radiculopathy, cervical region: Secondary | ICD-10-CM | POA: Diagnosis not present

## 2020-11-15 DIAGNOSIS — E1140 Type 2 diabetes mellitus with diabetic neuropathy, unspecified: Secondary | ICD-10-CM | POA: Diagnosis not present

## 2020-11-15 DIAGNOSIS — M179 Osteoarthritis of knee, unspecified: Secondary | ICD-10-CM | POA: Diagnosis not present

## 2020-11-15 DIAGNOSIS — E1122 Type 2 diabetes mellitus with diabetic chronic kidney disease: Secondary | ICD-10-CM | POA: Diagnosis not present

## 2020-11-15 DIAGNOSIS — Z9181 History of falling: Secondary | ICD-10-CM | POA: Diagnosis not present

## 2020-11-15 DIAGNOSIS — J449 Chronic obstructive pulmonary disease, unspecified: Secondary | ICD-10-CM | POA: Diagnosis not present

## 2020-11-15 DIAGNOSIS — Z8673 Personal history of transient ischemic attack (TIA), and cerebral infarction without residual deficits: Secondary | ICD-10-CM | POA: Diagnosis not present

## 2020-11-15 DIAGNOSIS — I872 Venous insufficiency (chronic) (peripheral): Secondary | ICD-10-CM | POA: Diagnosis not present

## 2020-11-15 DIAGNOSIS — E785 Hyperlipidemia, unspecified: Secondary | ICD-10-CM | POA: Diagnosis not present

## 2020-11-15 DIAGNOSIS — I480 Paroxysmal atrial fibrillation: Secondary | ICD-10-CM | POA: Diagnosis not present

## 2020-11-15 DIAGNOSIS — N200 Calculus of kidney: Secondary | ICD-10-CM | POA: Diagnosis not present

## 2020-11-15 DIAGNOSIS — Z87891 Personal history of nicotine dependence: Secondary | ICD-10-CM | POA: Diagnosis not present

## 2020-11-15 DIAGNOSIS — M519 Unspecified thoracic, thoracolumbar and lumbosacral intervertebral disc disorder: Secondary | ICD-10-CM | POA: Diagnosis not present

## 2020-11-15 DIAGNOSIS — N3000 Acute cystitis without hematuria: Secondary | ICD-10-CM | POA: Diagnosis not present

## 2020-11-15 DIAGNOSIS — Z7901 Long term (current) use of anticoagulants: Secondary | ICD-10-CM | POA: Diagnosis not present

## 2020-11-15 DIAGNOSIS — I130 Hypertensive heart and chronic kidney disease with heart failure and stage 1 through stage 4 chronic kidney disease, or unspecified chronic kidney disease: Secondary | ICD-10-CM | POA: Diagnosis not present

## 2020-11-15 DIAGNOSIS — Z96652 Presence of left artificial knee joint: Secondary | ICD-10-CM | POA: Diagnosis not present

## 2020-11-15 DIAGNOSIS — I5032 Chronic diastolic (congestive) heart failure: Secondary | ICD-10-CM | POA: Diagnosis not present

## 2020-11-15 DIAGNOSIS — C9000 Multiple myeloma not having achieved remission: Secondary | ICD-10-CM | POA: Diagnosis not present

## 2020-11-15 DIAGNOSIS — N390 Urinary tract infection, site not specified: Secondary | ICD-10-CM | POA: Diagnosis not present

## 2020-11-15 DIAGNOSIS — N183 Chronic kidney disease, stage 3 unspecified: Secondary | ICD-10-CM | POA: Diagnosis not present

## 2020-11-15 DIAGNOSIS — A4189 Other specified sepsis: Secondary | ICD-10-CM | POA: Diagnosis not present

## 2020-11-19 ENCOUNTER — Encounter: Payer: Self-pay | Admitting: Internal Medicine

## 2020-11-19 DIAGNOSIS — E785 Hyperlipidemia, unspecified: Secondary | ICD-10-CM | POA: Diagnosis not present

## 2020-11-19 DIAGNOSIS — A4189 Other specified sepsis: Secondary | ICD-10-CM | POA: Diagnosis not present

## 2020-11-19 DIAGNOSIS — C9000 Multiple myeloma not having achieved remission: Secondary | ICD-10-CM | POA: Diagnosis not present

## 2020-11-19 DIAGNOSIS — N183 Chronic kidney disease, stage 3 unspecified: Secondary | ICD-10-CM | POA: Diagnosis not present

## 2020-11-19 DIAGNOSIS — J449 Chronic obstructive pulmonary disease, unspecified: Secondary | ICD-10-CM | POA: Diagnosis not present

## 2020-11-19 DIAGNOSIS — N200 Calculus of kidney: Secondary | ICD-10-CM | POA: Diagnosis not present

## 2020-11-19 DIAGNOSIS — E1140 Type 2 diabetes mellitus with diabetic neuropathy, unspecified: Secondary | ICD-10-CM | POA: Diagnosis not present

## 2020-11-19 DIAGNOSIS — N390 Urinary tract infection, site not specified: Secondary | ICD-10-CM | POA: Diagnosis not present

## 2020-11-19 DIAGNOSIS — I872 Venous insufficiency (chronic) (peripheral): Secondary | ICD-10-CM | POA: Diagnosis not present

## 2020-11-19 DIAGNOSIS — K5730 Diverticulosis of large intestine without perforation or abscess without bleeding: Secondary | ICD-10-CM | POA: Diagnosis not present

## 2020-11-19 DIAGNOSIS — Z7901 Long term (current) use of anticoagulants: Secondary | ICD-10-CM | POA: Diagnosis not present

## 2020-11-19 DIAGNOSIS — I5032 Chronic diastolic (congestive) heart failure: Secondary | ICD-10-CM | POA: Diagnosis not present

## 2020-11-19 DIAGNOSIS — E1122 Type 2 diabetes mellitus with diabetic chronic kidney disease: Secondary | ICD-10-CM | POA: Diagnosis not present

## 2020-11-19 DIAGNOSIS — I480 Paroxysmal atrial fibrillation: Secondary | ICD-10-CM | POA: Diagnosis not present

## 2020-11-19 DIAGNOSIS — Z8673 Personal history of transient ischemic attack (TIA), and cerebral infarction without residual deficits: Secondary | ICD-10-CM | POA: Diagnosis not present

## 2020-11-19 DIAGNOSIS — M179 Osteoarthritis of knee, unspecified: Secondary | ICD-10-CM | POA: Diagnosis not present

## 2020-11-19 DIAGNOSIS — Z9181 History of falling: Secondary | ICD-10-CM | POA: Diagnosis not present

## 2020-11-19 DIAGNOSIS — I130 Hypertensive heart and chronic kidney disease with heart failure and stage 1 through stage 4 chronic kidney disease, or unspecified chronic kidney disease: Secondary | ICD-10-CM | POA: Diagnosis not present

## 2020-11-19 DIAGNOSIS — Z87891 Personal history of nicotine dependence: Secondary | ICD-10-CM | POA: Diagnosis not present

## 2020-11-19 DIAGNOSIS — M519 Unspecified thoracic, thoracolumbar and lumbosacral intervertebral disc disorder: Secondary | ICD-10-CM | POA: Diagnosis not present

## 2020-11-19 DIAGNOSIS — Z96652 Presence of left artificial knee joint: Secondary | ICD-10-CM | POA: Diagnosis not present

## 2020-11-19 DIAGNOSIS — M47812 Spondylosis without myelopathy or radiculopathy, cervical region: Secondary | ICD-10-CM | POA: Diagnosis not present

## 2020-11-22 DIAGNOSIS — M179 Osteoarthritis of knee, unspecified: Secondary | ICD-10-CM | POA: Diagnosis not present

## 2020-11-22 DIAGNOSIS — E1140 Type 2 diabetes mellitus with diabetic neuropathy, unspecified: Secondary | ICD-10-CM | POA: Diagnosis not present

## 2020-11-22 DIAGNOSIS — Z7901 Long term (current) use of anticoagulants: Secondary | ICD-10-CM | POA: Diagnosis not present

## 2020-11-22 DIAGNOSIS — Z87891 Personal history of nicotine dependence: Secondary | ICD-10-CM | POA: Diagnosis not present

## 2020-11-22 DIAGNOSIS — I5032 Chronic diastolic (congestive) heart failure: Secondary | ICD-10-CM | POA: Diagnosis not present

## 2020-11-22 DIAGNOSIS — I130 Hypertensive heart and chronic kidney disease with heart failure and stage 1 through stage 4 chronic kidney disease, or unspecified chronic kidney disease: Secondary | ICD-10-CM | POA: Diagnosis not present

## 2020-11-22 DIAGNOSIS — N183 Chronic kidney disease, stage 3 unspecified: Secondary | ICD-10-CM | POA: Diagnosis not present

## 2020-11-22 DIAGNOSIS — J449 Chronic obstructive pulmonary disease, unspecified: Secondary | ICD-10-CM | POA: Diagnosis not present

## 2020-11-22 DIAGNOSIS — E785 Hyperlipidemia, unspecified: Secondary | ICD-10-CM | POA: Diagnosis not present

## 2020-11-22 DIAGNOSIS — N390 Urinary tract infection, site not specified: Secondary | ICD-10-CM | POA: Diagnosis not present

## 2020-11-22 DIAGNOSIS — Z8673 Personal history of transient ischemic attack (TIA), and cerebral infarction without residual deficits: Secondary | ICD-10-CM | POA: Diagnosis not present

## 2020-11-22 DIAGNOSIS — M47812 Spondylosis without myelopathy or radiculopathy, cervical region: Secondary | ICD-10-CM | POA: Diagnosis not present

## 2020-11-22 DIAGNOSIS — A4189 Other specified sepsis: Secondary | ICD-10-CM | POA: Diagnosis not present

## 2020-11-22 DIAGNOSIS — Z96652 Presence of left artificial knee joint: Secondary | ICD-10-CM | POA: Diagnosis not present

## 2020-11-22 DIAGNOSIS — I872 Venous insufficiency (chronic) (peripheral): Secondary | ICD-10-CM | POA: Diagnosis not present

## 2020-11-22 DIAGNOSIS — N200 Calculus of kidney: Secondary | ICD-10-CM | POA: Diagnosis not present

## 2020-11-22 DIAGNOSIS — M519 Unspecified thoracic, thoracolumbar and lumbosacral intervertebral disc disorder: Secondary | ICD-10-CM | POA: Diagnosis not present

## 2020-11-22 DIAGNOSIS — E1122 Type 2 diabetes mellitus with diabetic chronic kidney disease: Secondary | ICD-10-CM | POA: Diagnosis not present

## 2020-11-22 DIAGNOSIS — C9000 Multiple myeloma not having achieved remission: Secondary | ICD-10-CM | POA: Diagnosis not present

## 2020-11-22 DIAGNOSIS — I480 Paroxysmal atrial fibrillation: Secondary | ICD-10-CM | POA: Diagnosis not present

## 2020-11-22 DIAGNOSIS — K5730 Diverticulosis of large intestine without perforation or abscess without bleeding: Secondary | ICD-10-CM | POA: Diagnosis not present

## 2020-11-22 DIAGNOSIS — Z9181 History of falling: Secondary | ICD-10-CM | POA: Diagnosis not present

## 2020-11-23 DIAGNOSIS — J449 Chronic obstructive pulmonary disease, unspecified: Secondary | ICD-10-CM | POA: Diagnosis not present

## 2020-11-23 DIAGNOSIS — Z8673 Personal history of transient ischemic attack (TIA), and cerebral infarction without residual deficits: Secondary | ICD-10-CM | POA: Diagnosis not present

## 2020-11-23 DIAGNOSIS — Z9181 History of falling: Secondary | ICD-10-CM | POA: Diagnosis not present

## 2020-11-23 DIAGNOSIS — I872 Venous insufficiency (chronic) (peripheral): Secondary | ICD-10-CM | POA: Diagnosis not present

## 2020-11-23 DIAGNOSIS — E785 Hyperlipidemia, unspecified: Secondary | ICD-10-CM | POA: Diagnosis not present

## 2020-11-23 DIAGNOSIS — E1140 Type 2 diabetes mellitus with diabetic neuropathy, unspecified: Secondary | ICD-10-CM | POA: Diagnosis not present

## 2020-11-23 DIAGNOSIS — K5730 Diverticulosis of large intestine without perforation or abscess without bleeding: Secondary | ICD-10-CM | POA: Diagnosis not present

## 2020-11-23 DIAGNOSIS — I130 Hypertensive heart and chronic kidney disease with heart failure and stage 1 through stage 4 chronic kidney disease, or unspecified chronic kidney disease: Secondary | ICD-10-CM | POA: Diagnosis not present

## 2020-11-23 DIAGNOSIS — Z87891 Personal history of nicotine dependence: Secondary | ICD-10-CM | POA: Diagnosis not present

## 2020-11-23 DIAGNOSIS — E1122 Type 2 diabetes mellitus with diabetic chronic kidney disease: Secondary | ICD-10-CM | POA: Diagnosis not present

## 2020-11-23 DIAGNOSIS — A4189 Other specified sepsis: Secondary | ICD-10-CM | POA: Diagnosis not present

## 2020-11-23 DIAGNOSIS — C9000 Multiple myeloma not having achieved remission: Secondary | ICD-10-CM | POA: Diagnosis not present

## 2020-11-23 DIAGNOSIS — N390 Urinary tract infection, site not specified: Secondary | ICD-10-CM | POA: Diagnosis not present

## 2020-11-23 DIAGNOSIS — M519 Unspecified thoracic, thoracolumbar and lumbosacral intervertebral disc disorder: Secondary | ICD-10-CM | POA: Diagnosis not present

## 2020-11-23 DIAGNOSIS — M47812 Spondylosis without myelopathy or radiculopathy, cervical region: Secondary | ICD-10-CM | POA: Diagnosis not present

## 2020-11-23 DIAGNOSIS — I5032 Chronic diastolic (congestive) heart failure: Secondary | ICD-10-CM | POA: Diagnosis not present

## 2020-11-23 DIAGNOSIS — I480 Paroxysmal atrial fibrillation: Secondary | ICD-10-CM | POA: Diagnosis not present

## 2020-11-23 DIAGNOSIS — Z7901 Long term (current) use of anticoagulants: Secondary | ICD-10-CM | POA: Diagnosis not present

## 2020-11-23 DIAGNOSIS — M179 Osteoarthritis of knee, unspecified: Secondary | ICD-10-CM | POA: Diagnosis not present

## 2020-11-23 DIAGNOSIS — N183 Chronic kidney disease, stage 3 unspecified: Secondary | ICD-10-CM | POA: Diagnosis not present

## 2020-11-23 DIAGNOSIS — Z96652 Presence of left artificial knee joint: Secondary | ICD-10-CM | POA: Diagnosis not present

## 2020-11-23 DIAGNOSIS — N200 Calculus of kidney: Secondary | ICD-10-CM | POA: Diagnosis not present

## 2020-11-27 DIAGNOSIS — N200 Calculus of kidney: Secondary | ICD-10-CM | POA: Diagnosis not present

## 2020-11-27 DIAGNOSIS — Z9181 History of falling: Secondary | ICD-10-CM | POA: Diagnosis not present

## 2020-11-27 DIAGNOSIS — I130 Hypertensive heart and chronic kidney disease with heart failure and stage 1 through stage 4 chronic kidney disease, or unspecified chronic kidney disease: Secondary | ICD-10-CM | POA: Diagnosis not present

## 2020-11-27 DIAGNOSIS — M179 Osteoarthritis of knee, unspecified: Secondary | ICD-10-CM | POA: Diagnosis not present

## 2020-11-27 DIAGNOSIS — I480 Paroxysmal atrial fibrillation: Secondary | ICD-10-CM | POA: Diagnosis not present

## 2020-11-27 DIAGNOSIS — M519 Unspecified thoracic, thoracolumbar and lumbosacral intervertebral disc disorder: Secondary | ICD-10-CM | POA: Diagnosis not present

## 2020-11-27 DIAGNOSIS — Z87891 Personal history of nicotine dependence: Secondary | ICD-10-CM | POA: Diagnosis not present

## 2020-11-27 DIAGNOSIS — E1140 Type 2 diabetes mellitus with diabetic neuropathy, unspecified: Secondary | ICD-10-CM | POA: Diagnosis not present

## 2020-11-27 DIAGNOSIS — E1122 Type 2 diabetes mellitus with diabetic chronic kidney disease: Secondary | ICD-10-CM | POA: Diagnosis not present

## 2020-11-27 DIAGNOSIS — Z7901 Long term (current) use of anticoagulants: Secondary | ICD-10-CM | POA: Diagnosis not present

## 2020-11-27 DIAGNOSIS — E785 Hyperlipidemia, unspecified: Secondary | ICD-10-CM | POA: Diagnosis not present

## 2020-11-27 DIAGNOSIS — M1990 Unspecified osteoarthritis, unspecified site: Secondary | ICD-10-CM | POA: Diagnosis not present

## 2020-11-27 DIAGNOSIS — K5730 Diverticulosis of large intestine without perforation or abscess without bleeding: Secondary | ICD-10-CM | POA: Diagnosis not present

## 2020-11-27 DIAGNOSIS — Z96652 Presence of left artificial knee joint: Secondary | ICD-10-CM | POA: Diagnosis not present

## 2020-11-27 DIAGNOSIS — N390 Urinary tract infection, site not specified: Secondary | ICD-10-CM | POA: Diagnosis not present

## 2020-11-27 DIAGNOSIS — A4189 Other specified sepsis: Secondary | ICD-10-CM | POA: Diagnosis not present

## 2020-11-27 DIAGNOSIS — J449 Chronic obstructive pulmonary disease, unspecified: Secondary | ICD-10-CM | POA: Diagnosis not present

## 2020-11-27 DIAGNOSIS — C9000 Multiple myeloma not having achieved remission: Secondary | ICD-10-CM | POA: Diagnosis not present

## 2020-11-27 DIAGNOSIS — I872 Venous insufficiency (chronic) (peripheral): Secondary | ICD-10-CM | POA: Diagnosis not present

## 2020-11-27 DIAGNOSIS — I5032 Chronic diastolic (congestive) heart failure: Secondary | ICD-10-CM | POA: Diagnosis not present

## 2020-11-27 DIAGNOSIS — M47812 Spondylosis without myelopathy or radiculopathy, cervical region: Secondary | ICD-10-CM | POA: Diagnosis not present

## 2020-11-27 DIAGNOSIS — Z8673 Personal history of transient ischemic attack (TIA), and cerebral infarction without residual deficits: Secondary | ICD-10-CM | POA: Diagnosis not present

## 2020-11-27 DIAGNOSIS — N183 Chronic kidney disease, stage 3 unspecified: Secondary | ICD-10-CM | POA: Diagnosis not present

## 2020-11-28 DIAGNOSIS — M519 Unspecified thoracic, thoracolumbar and lumbosacral intervertebral disc disorder: Secondary | ICD-10-CM | POA: Diagnosis not present

## 2020-11-28 DIAGNOSIS — Z9181 History of falling: Secondary | ICD-10-CM | POA: Diagnosis not present

## 2020-11-28 DIAGNOSIS — E1140 Type 2 diabetes mellitus with diabetic neuropathy, unspecified: Secondary | ICD-10-CM | POA: Diagnosis not present

## 2020-11-28 DIAGNOSIS — I872 Venous insufficiency (chronic) (peripheral): Secondary | ICD-10-CM | POA: Diagnosis not present

## 2020-11-28 DIAGNOSIS — Z87891 Personal history of nicotine dependence: Secondary | ICD-10-CM | POA: Diagnosis not present

## 2020-11-28 DIAGNOSIS — I5032 Chronic diastolic (congestive) heart failure: Secondary | ICD-10-CM | POA: Diagnosis not present

## 2020-11-28 DIAGNOSIS — M179 Osteoarthritis of knee, unspecified: Secondary | ICD-10-CM | POA: Diagnosis not present

## 2020-11-28 DIAGNOSIS — N390 Urinary tract infection, site not specified: Secondary | ICD-10-CM | POA: Diagnosis not present

## 2020-11-28 DIAGNOSIS — I480 Paroxysmal atrial fibrillation: Secondary | ICD-10-CM | POA: Diagnosis not present

## 2020-11-28 DIAGNOSIS — J449 Chronic obstructive pulmonary disease, unspecified: Secondary | ICD-10-CM | POA: Diagnosis not present

## 2020-11-28 DIAGNOSIS — Z7901 Long term (current) use of anticoagulants: Secondary | ICD-10-CM | POA: Diagnosis not present

## 2020-11-28 DIAGNOSIS — M47812 Spondylosis without myelopathy or radiculopathy, cervical region: Secondary | ICD-10-CM | POA: Diagnosis not present

## 2020-11-28 DIAGNOSIS — K5730 Diverticulosis of large intestine without perforation or abscess without bleeding: Secondary | ICD-10-CM | POA: Diagnosis not present

## 2020-11-28 DIAGNOSIS — N183 Chronic kidney disease, stage 3 unspecified: Secondary | ICD-10-CM | POA: Diagnosis not present

## 2020-11-28 DIAGNOSIS — Z96652 Presence of left artificial knee joint: Secondary | ICD-10-CM | POA: Diagnosis not present

## 2020-11-28 DIAGNOSIS — E1122 Type 2 diabetes mellitus with diabetic chronic kidney disease: Secondary | ICD-10-CM | POA: Diagnosis not present

## 2020-11-28 DIAGNOSIS — N200 Calculus of kidney: Secondary | ICD-10-CM | POA: Diagnosis not present

## 2020-11-28 DIAGNOSIS — A4189 Other specified sepsis: Secondary | ICD-10-CM | POA: Diagnosis not present

## 2020-11-28 DIAGNOSIS — C9000 Multiple myeloma not having achieved remission: Secondary | ICD-10-CM | POA: Diagnosis not present

## 2020-11-28 DIAGNOSIS — E785 Hyperlipidemia, unspecified: Secondary | ICD-10-CM | POA: Diagnosis not present

## 2020-11-28 DIAGNOSIS — I130 Hypertensive heart and chronic kidney disease with heart failure and stage 1 through stage 4 chronic kidney disease, or unspecified chronic kidney disease: Secondary | ICD-10-CM | POA: Diagnosis not present

## 2020-11-28 DIAGNOSIS — Z8673 Personal history of transient ischemic attack (TIA), and cerebral infarction without residual deficits: Secondary | ICD-10-CM | POA: Diagnosis not present

## 2020-11-30 DIAGNOSIS — E1122 Type 2 diabetes mellitus with diabetic chronic kidney disease: Secondary | ICD-10-CM | POA: Diagnosis not present

## 2020-11-30 DIAGNOSIS — I5032 Chronic diastolic (congestive) heart failure: Secondary | ICD-10-CM | POA: Diagnosis not present

## 2020-11-30 DIAGNOSIS — Z87891 Personal history of nicotine dependence: Secondary | ICD-10-CM | POA: Diagnosis not present

## 2020-11-30 DIAGNOSIS — Z96652 Presence of left artificial knee joint: Secondary | ICD-10-CM | POA: Diagnosis not present

## 2020-11-30 DIAGNOSIS — N183 Chronic kidney disease, stage 3 unspecified: Secondary | ICD-10-CM | POA: Diagnosis not present

## 2020-11-30 DIAGNOSIS — Z9181 History of falling: Secondary | ICD-10-CM | POA: Diagnosis not present

## 2020-11-30 DIAGNOSIS — M47812 Spondylosis without myelopathy or radiculopathy, cervical region: Secondary | ICD-10-CM | POA: Diagnosis not present

## 2020-11-30 DIAGNOSIS — E1140 Type 2 diabetes mellitus with diabetic neuropathy, unspecified: Secondary | ICD-10-CM | POA: Diagnosis not present

## 2020-11-30 DIAGNOSIS — I480 Paroxysmal atrial fibrillation: Secondary | ICD-10-CM | POA: Diagnosis not present

## 2020-11-30 DIAGNOSIS — Z7901 Long term (current) use of anticoagulants: Secondary | ICD-10-CM | POA: Diagnosis not present

## 2020-11-30 DIAGNOSIS — J449 Chronic obstructive pulmonary disease, unspecified: Secondary | ICD-10-CM | POA: Diagnosis not present

## 2020-11-30 DIAGNOSIS — Z8673 Personal history of transient ischemic attack (TIA), and cerebral infarction without residual deficits: Secondary | ICD-10-CM | POA: Diagnosis not present

## 2020-11-30 DIAGNOSIS — E785 Hyperlipidemia, unspecified: Secondary | ICD-10-CM | POA: Diagnosis not present

## 2020-11-30 DIAGNOSIS — N390 Urinary tract infection, site not specified: Secondary | ICD-10-CM | POA: Diagnosis not present

## 2020-11-30 DIAGNOSIS — K5730 Diverticulosis of large intestine without perforation or abscess without bleeding: Secondary | ICD-10-CM | POA: Diagnosis not present

## 2020-11-30 DIAGNOSIS — M519 Unspecified thoracic, thoracolumbar and lumbosacral intervertebral disc disorder: Secondary | ICD-10-CM | POA: Diagnosis not present

## 2020-11-30 DIAGNOSIS — M179 Osteoarthritis of knee, unspecified: Secondary | ICD-10-CM | POA: Diagnosis not present

## 2020-11-30 DIAGNOSIS — C9000 Multiple myeloma not having achieved remission: Secondary | ICD-10-CM | POA: Diagnosis not present

## 2020-11-30 DIAGNOSIS — N200 Calculus of kidney: Secondary | ICD-10-CM | POA: Diagnosis not present

## 2020-11-30 DIAGNOSIS — I872 Venous insufficiency (chronic) (peripheral): Secondary | ICD-10-CM | POA: Diagnosis not present

## 2020-11-30 DIAGNOSIS — A4189 Other specified sepsis: Secondary | ICD-10-CM | POA: Diagnosis not present

## 2020-11-30 DIAGNOSIS — I130 Hypertensive heart and chronic kidney disease with heart failure and stage 1 through stage 4 chronic kidney disease, or unspecified chronic kidney disease: Secondary | ICD-10-CM | POA: Diagnosis not present

## 2020-12-03 DIAGNOSIS — I480 Paroxysmal atrial fibrillation: Secondary | ICD-10-CM | POA: Diagnosis not present

## 2020-12-03 DIAGNOSIS — N200 Calculus of kidney: Secondary | ICD-10-CM | POA: Diagnosis not present

## 2020-12-03 DIAGNOSIS — E1122 Type 2 diabetes mellitus with diabetic chronic kidney disease: Secondary | ICD-10-CM | POA: Diagnosis not present

## 2020-12-03 DIAGNOSIS — A4189 Other specified sepsis: Secondary | ICD-10-CM | POA: Diagnosis not present

## 2020-12-03 DIAGNOSIS — Z8673 Personal history of transient ischemic attack (TIA), and cerebral infarction without residual deficits: Secondary | ICD-10-CM | POA: Diagnosis not present

## 2020-12-03 DIAGNOSIS — I872 Venous insufficiency (chronic) (peripheral): Secondary | ICD-10-CM | POA: Diagnosis not present

## 2020-12-03 DIAGNOSIS — K5730 Diverticulosis of large intestine without perforation or abscess without bleeding: Secondary | ICD-10-CM | POA: Diagnosis not present

## 2020-12-03 DIAGNOSIS — I5032 Chronic diastolic (congestive) heart failure: Secondary | ICD-10-CM | POA: Diagnosis not present

## 2020-12-03 DIAGNOSIS — M179 Osteoarthritis of knee, unspecified: Secondary | ICD-10-CM | POA: Diagnosis not present

## 2020-12-03 DIAGNOSIS — Z87891 Personal history of nicotine dependence: Secondary | ICD-10-CM | POA: Diagnosis not present

## 2020-12-03 DIAGNOSIS — J449 Chronic obstructive pulmonary disease, unspecified: Secondary | ICD-10-CM | POA: Diagnosis not present

## 2020-12-03 DIAGNOSIS — Z7901 Long term (current) use of anticoagulants: Secondary | ICD-10-CM | POA: Diagnosis not present

## 2020-12-03 DIAGNOSIS — Z96652 Presence of left artificial knee joint: Secondary | ICD-10-CM | POA: Diagnosis not present

## 2020-12-03 DIAGNOSIS — E785 Hyperlipidemia, unspecified: Secondary | ICD-10-CM | POA: Diagnosis not present

## 2020-12-03 DIAGNOSIS — M519 Unspecified thoracic, thoracolumbar and lumbosacral intervertebral disc disorder: Secondary | ICD-10-CM | POA: Diagnosis not present

## 2020-12-03 DIAGNOSIS — Z9181 History of falling: Secondary | ICD-10-CM | POA: Diagnosis not present

## 2020-12-03 DIAGNOSIS — C9000 Multiple myeloma not having achieved remission: Secondary | ICD-10-CM | POA: Diagnosis not present

## 2020-12-03 DIAGNOSIS — N183 Chronic kidney disease, stage 3 unspecified: Secondary | ICD-10-CM | POA: Diagnosis not present

## 2020-12-03 DIAGNOSIS — E1140 Type 2 diabetes mellitus with diabetic neuropathy, unspecified: Secondary | ICD-10-CM | POA: Diagnosis not present

## 2020-12-03 DIAGNOSIS — N390 Urinary tract infection, site not specified: Secondary | ICD-10-CM | POA: Diagnosis not present

## 2020-12-03 DIAGNOSIS — M47812 Spondylosis without myelopathy or radiculopathy, cervical region: Secondary | ICD-10-CM | POA: Diagnosis not present

## 2020-12-03 DIAGNOSIS — I130 Hypertensive heart and chronic kidney disease with heart failure and stage 1 through stage 4 chronic kidney disease, or unspecified chronic kidney disease: Secondary | ICD-10-CM | POA: Diagnosis not present

## 2020-12-04 ENCOUNTER — Telehealth: Payer: Self-pay | Admitting: Internal Medicine

## 2020-12-04 DIAGNOSIS — I480 Paroxysmal atrial fibrillation: Secondary | ICD-10-CM | POA: Diagnosis not present

## 2020-12-04 DIAGNOSIS — E1122 Type 2 diabetes mellitus with diabetic chronic kidney disease: Secondary | ICD-10-CM | POA: Diagnosis not present

## 2020-12-04 DIAGNOSIS — N390 Urinary tract infection, site not specified: Secondary | ICD-10-CM | POA: Diagnosis not present

## 2020-12-04 DIAGNOSIS — Z87891 Personal history of nicotine dependence: Secondary | ICD-10-CM | POA: Diagnosis not present

## 2020-12-04 DIAGNOSIS — N200 Calculus of kidney: Secondary | ICD-10-CM | POA: Diagnosis not present

## 2020-12-04 DIAGNOSIS — Z96652 Presence of left artificial knee joint: Secondary | ICD-10-CM | POA: Diagnosis not present

## 2020-12-04 DIAGNOSIS — E785 Hyperlipidemia, unspecified: Secondary | ICD-10-CM | POA: Diagnosis not present

## 2020-12-04 DIAGNOSIS — I130 Hypertensive heart and chronic kidney disease with heart failure and stage 1 through stage 4 chronic kidney disease, or unspecified chronic kidney disease: Secondary | ICD-10-CM | POA: Diagnosis not present

## 2020-12-04 DIAGNOSIS — Z7901 Long term (current) use of anticoagulants: Secondary | ICD-10-CM | POA: Diagnosis not present

## 2020-12-04 DIAGNOSIS — N183 Chronic kidney disease, stage 3 unspecified: Secondary | ICD-10-CM | POA: Diagnosis not present

## 2020-12-04 DIAGNOSIS — C9000 Multiple myeloma not having achieved remission: Secondary | ICD-10-CM | POA: Diagnosis not present

## 2020-12-04 DIAGNOSIS — J449 Chronic obstructive pulmonary disease, unspecified: Secondary | ICD-10-CM | POA: Diagnosis not present

## 2020-12-04 DIAGNOSIS — M179 Osteoarthritis of knee, unspecified: Secondary | ICD-10-CM | POA: Diagnosis not present

## 2020-12-04 DIAGNOSIS — I5032 Chronic diastolic (congestive) heart failure: Secondary | ICD-10-CM | POA: Diagnosis not present

## 2020-12-04 DIAGNOSIS — Z9181 History of falling: Secondary | ICD-10-CM | POA: Diagnosis not present

## 2020-12-04 DIAGNOSIS — E1140 Type 2 diabetes mellitus with diabetic neuropathy, unspecified: Secondary | ICD-10-CM | POA: Diagnosis not present

## 2020-12-04 DIAGNOSIS — I872 Venous insufficiency (chronic) (peripheral): Secondary | ICD-10-CM | POA: Diagnosis not present

## 2020-12-04 DIAGNOSIS — M519 Unspecified thoracic, thoracolumbar and lumbosacral intervertebral disc disorder: Secondary | ICD-10-CM | POA: Diagnosis not present

## 2020-12-04 DIAGNOSIS — M47812 Spondylosis without myelopathy or radiculopathy, cervical region: Secondary | ICD-10-CM | POA: Diagnosis not present

## 2020-12-04 DIAGNOSIS — Z8673 Personal history of transient ischemic attack (TIA), and cerebral infarction without residual deficits: Secondary | ICD-10-CM | POA: Diagnosis not present

## 2020-12-04 DIAGNOSIS — A4189 Other specified sepsis: Secondary | ICD-10-CM | POA: Diagnosis not present

## 2020-12-04 DIAGNOSIS — K5730 Diverticulosis of large intestine without perforation or abscess without bleeding: Secondary | ICD-10-CM | POA: Diagnosis not present

## 2020-12-04 MED ORDER — METOPROLOL TARTRATE 50 MG PO TABS: 25.0000 mg | ORAL_TABLET | Freq: Two times a day (BID) | ORAL | 3 refills | Status: DC

## 2020-12-04 NOTE — Telephone Encounter (Signed)
[name] from [facility] home health is calling to report that the patients heart rate is consistently low (40-45 BPM) which is outside of parameter.  Suggests to change the parameter or change the dosage of metoprolol tartrate (LOPRESSOR) 50 MG tablet  [phone]

## 2020-12-04 NOTE — Telephone Encounter (Signed)
Ok to change the metoprolol 50 to HALF (25 mg) twice per day - done erx

## 2020-12-05 NOTE — Telephone Encounter (Signed)
[name] from [facility] has been notified of the Lopressor being changed to 25mg  twice a day.....  Also informed the patient of the change and that it has been sent to the pharmacy. Discussed an average heart rate range should be 60-100 bpm. Patient also made aware of upcoming appointment.

## 2020-12-12 ENCOUNTER — Encounter: Payer: Self-pay | Admitting: Internal Medicine

## 2020-12-12 ENCOUNTER — Ambulatory Visit (INDEPENDENT_AMBULATORY_CARE_PROVIDER_SITE_OTHER): Payer: Medicare Other | Admitting: Internal Medicine

## 2020-12-12 ENCOUNTER — Other Ambulatory Visit: Payer: Self-pay

## 2020-12-12 ENCOUNTER — Telehealth: Payer: Self-pay | Admitting: Internal Medicine

## 2020-12-12 VITALS — BP 148/74 | HR 54 | Temp 98.3°F | Ht 73.0 in | Wt 273.0 lb

## 2020-12-12 DIAGNOSIS — C9000 Multiple myeloma not having achieved remission: Secondary | ICD-10-CM | POA: Diagnosis not present

## 2020-12-12 DIAGNOSIS — J449 Chronic obstructive pulmonary disease, unspecified: Secondary | ICD-10-CM | POA: Diagnosis not present

## 2020-12-12 DIAGNOSIS — I10 Essential (primary) hypertension: Secondary | ICD-10-CM | POA: Diagnosis not present

## 2020-12-12 DIAGNOSIS — I130 Hypertensive heart and chronic kidney disease with heart failure and stage 1 through stage 4 chronic kidney disease, or unspecified chronic kidney disease: Secondary | ICD-10-CM | POA: Diagnosis not present

## 2020-12-12 DIAGNOSIS — N390 Urinary tract infection, site not specified: Secondary | ICD-10-CM

## 2020-12-12 DIAGNOSIS — A4189 Other specified sepsis: Secondary | ICD-10-CM | POA: Diagnosis not present

## 2020-12-12 DIAGNOSIS — R269 Unspecified abnormalities of gait and mobility: Secondary | ICD-10-CM | POA: Diagnosis not present

## 2020-12-12 DIAGNOSIS — I700 Atherosclerosis of aorta: Secondary | ICD-10-CM

## 2020-12-12 DIAGNOSIS — M47812 Spondylosis without myelopathy or radiculopathy, cervical region: Secondary | ICD-10-CM | POA: Diagnosis not present

## 2020-12-12 DIAGNOSIS — E1140 Type 2 diabetes mellitus with diabetic neuropathy, unspecified: Secondary | ICD-10-CM | POA: Diagnosis not present

## 2020-12-12 DIAGNOSIS — Z96652 Presence of left artificial knee joint: Secondary | ICD-10-CM | POA: Diagnosis not present

## 2020-12-12 DIAGNOSIS — E1122 Type 2 diabetes mellitus with diabetic chronic kidney disease: Secondary | ICD-10-CM | POA: Diagnosis not present

## 2020-12-12 DIAGNOSIS — Z8673 Personal history of transient ischemic attack (TIA), and cerebral infarction without residual deficits: Secondary | ICD-10-CM | POA: Diagnosis not present

## 2020-12-12 DIAGNOSIS — I480 Paroxysmal atrial fibrillation: Secondary | ICD-10-CM | POA: Diagnosis not present

## 2020-12-12 DIAGNOSIS — N1832 Chronic kidney disease, stage 3b: Secondary | ICD-10-CM | POA: Diagnosis not present

## 2020-12-12 DIAGNOSIS — N200 Calculus of kidney: Secondary | ICD-10-CM | POA: Diagnosis not present

## 2020-12-12 DIAGNOSIS — I5032 Chronic diastolic (congestive) heart failure: Secondary | ICD-10-CM | POA: Diagnosis not present

## 2020-12-12 DIAGNOSIS — I872 Venous insufficiency (chronic) (peripheral): Secondary | ICD-10-CM | POA: Diagnosis not present

## 2020-12-12 DIAGNOSIS — Z7901 Long term (current) use of anticoagulants: Secondary | ICD-10-CM | POA: Diagnosis not present

## 2020-12-12 DIAGNOSIS — M519 Unspecified thoracic, thoracolumbar and lumbosacral intervertebral disc disorder: Secondary | ICD-10-CM | POA: Diagnosis not present

## 2020-12-12 DIAGNOSIS — K5730 Diverticulosis of large intestine without perforation or abscess without bleeding: Secondary | ICD-10-CM | POA: Diagnosis not present

## 2020-12-12 DIAGNOSIS — N183 Chronic kidney disease, stage 3 unspecified: Secondary | ICD-10-CM | POA: Diagnosis not present

## 2020-12-12 DIAGNOSIS — Z87891 Personal history of nicotine dependence: Secondary | ICD-10-CM | POA: Diagnosis not present

## 2020-12-12 DIAGNOSIS — Z9181 History of falling: Secondary | ICD-10-CM | POA: Diagnosis not present

## 2020-12-12 DIAGNOSIS — E785 Hyperlipidemia, unspecified: Secondary | ICD-10-CM | POA: Diagnosis not present

## 2020-12-12 DIAGNOSIS — M179 Osteoarthritis of knee, unspecified: Secondary | ICD-10-CM | POA: Diagnosis not present

## 2020-12-12 LAB — URINALYSIS, ROUTINE W REFLEX MICROSCOPIC
Bilirubin Urine: NEGATIVE
Ketones, ur: NEGATIVE
Leukocytes,Ua: NEGATIVE
Nitrite: NEGATIVE
Specific Gravity, Urine: 1.02 (ref 1.000–1.030)
Total Protein, Urine: 100 — AB
Urine Glucose: NEGATIVE
Urobilinogen, UA: 0.2 (ref 0.0–1.0)
pH: 5.5 (ref 5.0–8.0)

## 2020-12-12 MED ORDER — METOPROLOL TARTRATE 25 MG PO TABS: 25.0000 mg | ORAL_TABLET | Freq: Two times a day (BID) | ORAL | 3 refills | Status: DC

## 2020-12-12 NOTE — Telephone Encounter (Signed)
Orders have been faxed and Caryl Pina from [facility] has been notified.

## 2020-12-12 NOTE — Progress Notes (Signed)
Patient ID: [name], male   DOB: 09/07/1945, 75 y.o.   MRN: [id number]        Chief Complaint: f/u bradycardia, htn, low K and UTI       HPI:  [name] is a 75 y.o. male here overall doing ok, Pt denies chest pain, increased sob or doe, wheezing, orthopnea, PND, increased LE swelling, palpitations, dizziness or syncope.   Pt denies polydipsia, polyuria, or new focal neuro s/s.   Pt denies polydipsia, polyuria, or new focal neuro s/s.  Denies urinary symptoms such as dysuria, frequency, urgency, flank pain, hematuria or n/v, fever, chills.  Pt is taking amlodipine 10 mg despite it having been stopped at last d/c.   Pt denies fever, wt loss, night sweats, loss of appetite, or other constitutional symptoms        Wt Readings from Last 3 Encounters:  12/12/20 273 lb (123.8 kg)  08/23/20 270 lb 12.8 oz (122.8 kg)  03/29/20 249 lb 4.8 oz (113.1 kg)   BP Readings from Last 3 Encounters:  12/12/20 (!) 148/74  11/01/20 130/90  10/26/20 (!) 141/68         Past Medical History:  Diagnosis Date  . Adenoma 05/2008  . Alcoholism in recovery ([place])   . BPH (benign prostatic hyperplasia)   . Cancer [facility])    Prostate  . Chronic LBP    Hip & Back -- Sees Dr. Maia Petties @ [place]  . Colon polyps   . Complex renal cyst 12/2010  . Controlled type 2 diabetes mellitus with neuropathy ([place]) 01/2007  . COPD (chronic obstructive pulmonary disease) ([place])   . Diabetes ([place])   . Diverticulitis of colon   . ED (erectile dysfunction)    s/p Penile prosthesis (09/2010)  . History of prostate cancer    Dr. Karsten Ro  . History of sick sinus syndrome    reduced BB dose for Bradycardia  . HLD (hyperlipidemia)   . Hypertension   . Impaired glucose tolerance 12/21/2013  . Nephrolithiasis   . Osteoarthritis of both hips   . Osteoarthritis of lumbosacral spine    with Disc Disease  . PAF (paroxysmal atrial fibrillation) (HCC)    No longer on Amiodarone.  Not on Anticoaguation b/c  no recurrence.  . Paresthesias/numbness    Bilateral LE  . Peripheral neuropathy 12/21/2013   Past Surgical History:  Procedure Laterality Date  . LEFT HEART CATH AND CORONARY ANGIOGRAPHY  2003   Normal Coronary Arteries.  Marland Kitchen NM MYOVIEW LTD  02/21/2020   EF 60%.  Medium sized mild severity defect in the basal inferior, mid inferior and apical inferior location-suggesting of ischemia.  Read as low-intermediate risk.  (On cardiology review this is felt to be a fixed defect either related to prior infarct versus diaphragmatic attenuation.  Felt to be low risk)  . PENILE PROSTHESIS IMPLANT  06/21/2012   Procedure: PENILE PROTHESIS INFLATABLE;  Surgeon: Claybon Jabs, MD;  Location: [facility];  Service: Urology;  Laterality: N/A;  REMOVAL AND REPLACEMENT OF SOME  OF PROSTHESIS ([facility])   . PENILE PROSTHESIS PLACEMENT  09/2010  . PROSTATECTOMY    . REMOVAL OF PENILE PROSTHESIS N/A 02/15/2018   Procedure: REMOVAL OF PENILE PROSTHESIS;  Surgeon: Kathie Rhodes, MD;  Location: [facility];  Service: Urology;  Laterality: N/A;  . TRANSTHORACIC ECHOCARDIOGRAM  04/2016   a) Relatively normal EF of 55-60%. No RWMA suggesting no prior MI. Gr 2 DD (pseudo-normal filling pattern) along with moderately  dilated left atrium.;; b) 02/2018: Mod LVH. EF 65-70%. No RWMA.  Unable to assess DF 2/2 Afib. Mild MR. Severe BiAtrial Enlargement. High CVP.  Marland Kitchen TRANSTHORACIC ECHOCARDIOGRAM  02/17/2020   EF 60 to 65%.  No R WMA.  Unable to assess diastolic parameters because of A. fib.  Mildly elevated PA pressures.  Mild LA dilation.  Mild aortic valve sclerosis but no stenosis.  Normal IVC.    reports that he quit smoking about 38 years ago. His smoking use included cigarettes. He has never used smokeless tobacco. He reports current alcohol use. He reports that he does not use drugs. family history includes Coronary artery disease in his father and mother; Diabetes in his father; Heart attack in his mother; Prostate cancer  in his father. Allergies  Allergen Reactions  . Ciprofloxacin Other (See Comments)    All over weakness   Current Outpatient Medications on File Prior to Visit  Medication Sig Dispense Refill  . acetaminophen (TYLENOL) 325 MG tablet Take 2 tablets (650 mg total) by mouth every 6 (six) hours as needed for mild pain or moderate pain.    Marland Kitchen apixaban (ELIQUIS) 5 MG TABS tablet Take 1 tablet (5 mg total) by mouth 2 (two) times daily. 60 tablet 0  . Lancet Devices (ONETOUCH DELICA PLUS LANCING) MISC     . Lancets (ONETOUCH DELICA PLUS [id number]) MISC USE ONCE DAILY 100 each 3  . loratadine (CLARITIN) 10 MG tablet Take 10 mg by mouth daily as needed for allergies.    . polyethylene glycol (MIRALAX / GLYCOLAX) 17 g packet Take 17 g by mouth daily as needed for moderate constipation. 7 each 0  . potassium chloride (KLOR-CON) 10 MEQ tablet Take 1 tablet (10 mEq total) by mouth daily. 90 tablet 3  . pravastatin (PRAVACHOL) 40 MG tablet Take 1 tablet (40 mg total) by mouth daily. 90 tablet 3  . PROAIR HFA 108 (90 Base) MCG/ACT inhaler INHALE 2 PUFFS INTO THE  LUNGS EVERY 6 HOURS AS  NEEDED (Patient taking differently: Inhale 2 puffs into the lungs every 6 (six) hours as needed for shortness of breath or wheezing.) 34 g 3  . traZODone (DESYREL) 50 MG tablet Take 0.5-1 tablets (25-50 mg total) by mouth at bedtime as needed for sleep. 30 tablet 3  . furosemide (LASIX) 40 MG tablet Take 1 tablet (40 mg total) by mouth daily as needed for fluid or edema. 30 tablet 0   No current facility-administered medications on file prior to visit.        ROS:  All others reviewed and negative.  Objective        PE:  BP (!) 148/74 (BP Location: Right Arm, Patient Position: Sitting, Cuff Size: Large)   Pulse (!) 54   Temp 98.3 F (36.8 C) (Oral)   Ht '6\' 1"'  (1.854 m)   Wt 273 lb (123.8 kg)   SpO2 99%   BMI 36.02 kg/m                 Constitutional: Pt appears in NAD               HENT: Head: NCAT.                 Right Ear: External ear normal.                 Left Ear: External ear normal.                Eyes: .  Pupils are equal, round, and reactive to light. Conjunctivae and EOM are normal               Nose: without d/c or deformity               Neck: Neck supple. Gross normal ROM               Cardiovascular: Normal rate and regular rhythm.                 Pulmonary/Chest: Effort normal and breath sounds without rales or wheezing.                Abd:  Soft, NT, ND, + BS, no organomegaly               Neurological: Pt is alert. At baseline orientation, motor grossly intact               Skin: Skin is warm. No rashes, no other new lesions, LE edema - none               Psychiatric: Pt behavior is normal without agitation   Micro: none  Cardiac tracings I have personally interpreted today:  none  Pertinent Radiological findings (summarize): none   Lab Results  Component Value Date   WBC 8.0 11/01/2020   HGB 12.6 (L) 11/01/2020   HCT 38.6 (L) 11/01/2020   PLT 328.0 11/01/2020   GLUCOSE 78 11/01/2020   CHOL 142 08/23/2020   TRIG 62 08/23/2020   HDL 41 08/23/2020   LDLCALC 88 08/23/2020   ALT 12 11/01/2020   AST 20 11/01/2020   NA 138 11/01/2020   K 3.0 (L) 11/01/2020   CL 104 11/01/2020   CREATININE 1.63 (H) 11/01/2020   BUN 16 11/01/2020   CO2 28 11/01/2020   TSH 1.374 10/23/2020   PSA 0.00 (L) 10/06/2019   INR 1.63 (H) 08/26/2010   HGBA1C 6.7 (H) 10/22/2020   MICROALBUR 1.8 10/06/2019   Assessment/Plan:  Vonnie Wenig is a 75 y.o. Black or African American [2] male with  has a past medical history of Adenoma (05/2008), Alcoholism in recovery [facility]), BPH (benign prostatic hyperplasia), Cancer ([place]), Chronic LBP, Colon polyps, Complex renal cyst (12/2010), Controlled type 2 diabetes mellitus with neuropathy ([place]) (01/2007), COPD (chronic obstructive pulmonary disease) ([place]), Diabetes ([place]), Diverticulitis of colon, ED (erectile dysfunction), History of prostate cancer, History  of sick sinus syndrome, HLD (hyperlipidemia), Hypertension, Impaired glucose tolerance (12/21/2013), Nephrolithiasis, Osteoarthritis of both hips, Osteoarthritis of lumbosacral spine, PAF (paroxysmal atrial fibrillation) ([place]), Paresthesias/numbness, and Peripheral neuropathy (12/21/2013).  Multiple myeloma ([place]) Continue oncology f/u  CKD (chronic kidney disease) stage 3, GFR 30-59 ml/min (HCC) Lab Results  Component Value Date   CREATININE 1.63 (H) 11/01/2020   Stable overall, cont to avoid nephrotoxins   Aortic atherosclerosis (HCC) To cont diet, pravachol, exercise as able   Gait disorder Also for hosp bed, shower chair  HTN (hypertension) Overall stable, to continue the amlodipine 10 mg though was was stopped at last d/c  Acute lower UTI Improved, for f/u UA  Followup: Return in about 4 months (around 04/14/2021).  Cathlean Cower, MD 12/16/2020 6:33 PM [facility]

## 2020-12-12 NOTE — Patient Instructions (Signed)
Please continue all other medications as before, including the amlodipine  Please have the pharmacy call with any other refills you may need.  Please continue your efforts at being more active, low cholesterol diet, and weight control.  You are otherwise up to date with prevention measures today.  Please keep your appointments with your specialists as you may have planned  [place] requests will be faxed to [facility]  Please go to the LAB at the blood drawing area for the tests to be done  You will be contacted by phone if any changes need to be made immediately.  Otherwise, you will receive a letter about your results with an explanation, but please check with MyChart first.  Please remember to sign up for MyChart if you have not done so, as this will be important to you in the future with finding out test results, communicating by private email, and scheduling acute appointments online when needed.  Please make an Appointment to return in 4 months

## 2020-12-12 NOTE — Telephone Encounter (Signed)
[name] called and was wondering if a DME order for a hospital bed was received. She said that she faxed it over today. Please advise   Phone: [phone] Ext. [phone]

## 2020-12-16 ENCOUNTER — Encounter: Payer: Self-pay | Admitting: Internal Medicine

## 2020-12-16 DIAGNOSIS — I10 Essential (primary) hypertension: Secondary | ICD-10-CM | POA: Insufficient documentation

## 2020-12-16 DIAGNOSIS — R269 Unspecified abnormalities of gait and mobility: Secondary | ICD-10-CM | POA: Insufficient documentation

## 2020-12-16 NOTE — Assessment & Plan Note (Signed)
Also for hosp bed, shower chair

## 2020-12-16 NOTE — Assessment & Plan Note (Signed)
To cont diet, pravachol, exercise as able

## 2020-12-16 NOTE — Assessment & Plan Note (Signed)
Overall stable, to continue the amlodipine 10 mg though was was stopped at last d/c

## 2020-12-16 NOTE — Assessment & Plan Note (Signed)
Improved, for f/u UA

## 2020-12-16 NOTE — Assessment & Plan Note (Signed)
Lab Results  Component Value Date   CREATININE 1.63 (H) 11/01/2020   Stable overall, cont to avoid nephrotoxins

## 2020-12-16 NOTE — Assessment & Plan Note (Signed)
Continue oncology f/u

## 2020-12-19 DIAGNOSIS — Z8673 Personal history of transient ischemic attack (TIA), and cerebral infarction without residual deficits: Secondary | ICD-10-CM | POA: Diagnosis not present

## 2020-12-19 DIAGNOSIS — E1140 Type 2 diabetes mellitus with diabetic neuropathy, unspecified: Secondary | ICD-10-CM | POA: Diagnosis not present

## 2020-12-19 DIAGNOSIS — E1122 Type 2 diabetes mellitus with diabetic chronic kidney disease: Secondary | ICD-10-CM | POA: Diagnosis not present

## 2020-12-19 DIAGNOSIS — J449 Chronic obstructive pulmonary disease, unspecified: Secondary | ICD-10-CM | POA: Diagnosis not present

## 2020-12-19 DIAGNOSIS — Z9181 History of falling: Secondary | ICD-10-CM | POA: Diagnosis not present

## 2020-12-28 ENCOUNTER — Other Ambulatory Visit: Payer: Self-pay | Admitting: Internal Medicine

## 2020-12-28 ENCOUNTER — Encounter: Payer: Self-pay | Admitting: Internal Medicine

## 2020-12-28 MED ORDER — SULFAMETHOXAZOLE-TRIMETHOPRIM 800-160 MG PO TABS: 1.0000 | ORAL_TABLET | Freq: Two times a day (BID) | ORAL | 0 refills | Status: DC

## 2021-01-08 ENCOUNTER — Telehealth: Payer: Self-pay | Admitting: Internal Medicine

## 2021-01-08 DIAGNOSIS — M1990 Unspecified osteoarthritis, unspecified site: Secondary | ICD-10-CM | POA: Diagnosis not present

## 2021-01-08 DIAGNOSIS — R0902 Hypoxemia: Secondary | ICD-10-CM

## 2021-01-08 DIAGNOSIS — E785 Hyperlipidemia, unspecified: Secondary | ICD-10-CM | POA: Diagnosis not present

## 2021-01-08 DIAGNOSIS — I480 Paroxysmal atrial fibrillation: Secondary | ICD-10-CM | POA: Diagnosis not present

## 2021-01-08 DIAGNOSIS — L97909 Non-pressure chronic ulcer of unspecified part of unspecified lower leg with unspecified severity: Secondary | ICD-10-CM

## 2021-01-08 DIAGNOSIS — C9000 Multiple myeloma not having achieved remission: Secondary | ICD-10-CM

## 2021-01-08 DIAGNOSIS — E11622 Type 2 diabetes mellitus with other skin ulcer: Secondary | ICD-10-CM

## 2021-01-08 DIAGNOSIS — N183 Chronic kidney disease, stage 3 unspecified: Secondary | ICD-10-CM | POA: Diagnosis not present

## 2021-01-08 DIAGNOSIS — M179 Osteoarthritis of knee, unspecified: Secondary | ICD-10-CM | POA: Diagnosis not present

## 2021-01-08 DIAGNOSIS — R269 Unspecified abnormalities of gait and mobility: Secondary | ICD-10-CM

## 2021-01-08 NOTE — Telephone Encounter (Signed)
[name] from [facility]) has called stating the patient and his daughter [name] discussed having a shower chair ordered with the provider during the 5.25.22 appt and they have never received any information or updates on that.  Please Advise.  Also, they are requesting home health again with shower aid once shower chair is received.   Patient's daughter [name] [phone]

## 2021-01-08 NOTE — Telephone Encounter (Signed)
Hosp bed and shower chair already addressed at last visit, so I should not have to do this again, and was supposed to be forwarded to [facility] as per request  Other Home Health was never discussed or orderded  Is [facility] not involved?  I should not have to order this as the discussion was apparently [facility] was already involved  [place] to clarify with family please

## 2021-01-09 DIAGNOSIS — M47812 Spondylosis without myelopathy or radiculopathy, cervical region: Secondary | ICD-10-CM | POA: Diagnosis not present

## 2021-01-09 DIAGNOSIS — Z87891 Personal history of nicotine dependence: Secondary | ICD-10-CM | POA: Diagnosis not present

## 2021-01-09 DIAGNOSIS — E1140 Type 2 diabetes mellitus with diabetic neuropathy, unspecified: Secondary | ICD-10-CM | POA: Diagnosis not present

## 2021-01-09 DIAGNOSIS — I5032 Chronic diastolic (congestive) heart failure: Secondary | ICD-10-CM | POA: Diagnosis not present

## 2021-01-09 DIAGNOSIS — Z9181 History of falling: Secondary | ICD-10-CM | POA: Diagnosis not present

## 2021-01-09 DIAGNOSIS — E785 Hyperlipidemia, unspecified: Secondary | ICD-10-CM | POA: Diagnosis not present

## 2021-01-09 DIAGNOSIS — Z96652 Presence of left artificial knee joint: Secondary | ICD-10-CM | POA: Diagnosis not present

## 2021-01-09 DIAGNOSIS — M179 Osteoarthritis of knee, unspecified: Secondary | ICD-10-CM | POA: Diagnosis not present

## 2021-01-09 DIAGNOSIS — N183 Chronic kidney disease, stage 3 unspecified: Secondary | ICD-10-CM | POA: Diagnosis not present

## 2021-01-09 DIAGNOSIS — N390 Urinary tract infection, site not specified: Secondary | ICD-10-CM | POA: Diagnosis not present

## 2021-01-09 DIAGNOSIS — N200 Calculus of kidney: Secondary | ICD-10-CM | POA: Diagnosis not present

## 2021-01-09 DIAGNOSIS — A4189 Other specified sepsis: Secondary | ICD-10-CM | POA: Diagnosis not present

## 2021-01-09 DIAGNOSIS — Z7901 Long term (current) use of anticoagulants: Secondary | ICD-10-CM | POA: Diagnosis not present

## 2021-01-09 DIAGNOSIS — C9000 Multiple myeloma not having achieved remission: Secondary | ICD-10-CM | POA: Diagnosis not present

## 2021-01-09 DIAGNOSIS — Z8673 Personal history of transient ischemic attack (TIA), and cerebral infarction without residual deficits: Secondary | ICD-10-CM | POA: Diagnosis not present

## 2021-01-09 DIAGNOSIS — E1122 Type 2 diabetes mellitus with diabetic chronic kidney disease: Secondary | ICD-10-CM | POA: Diagnosis not present

## 2021-01-09 DIAGNOSIS — J449 Chronic obstructive pulmonary disease, unspecified: Secondary | ICD-10-CM | POA: Diagnosis not present

## 2021-01-09 DIAGNOSIS — K5730 Diverticulosis of large intestine without perforation or abscess without bleeding: Secondary | ICD-10-CM | POA: Diagnosis not present

## 2021-01-09 DIAGNOSIS — I872 Venous insufficiency (chronic) (peripheral): Secondary | ICD-10-CM | POA: Diagnosis not present

## 2021-01-09 DIAGNOSIS — I480 Paroxysmal atrial fibrillation: Secondary | ICD-10-CM | POA: Diagnosis not present

## 2021-01-09 DIAGNOSIS — M519 Unspecified thoracic, thoracolumbar and lumbosacral intervertebral disc disorder: Secondary | ICD-10-CM | POA: Diagnosis not present

## 2021-01-09 DIAGNOSIS — I130 Hypertensive heart and chronic kidney disease with heart failure and stage 1 through stage 4 chronic kidney disease, or unspecified chronic kidney disease: Secondary | ICD-10-CM | POA: Diagnosis not present

## 2021-01-09 NOTE — Telephone Encounter (Signed)
Returned Karena(Daughter of Pt) call, phone went to voicemail which was full.

## 2021-01-18 DIAGNOSIS — E1122 Type 2 diabetes mellitus with diabetic chronic kidney disease: Secondary | ICD-10-CM | POA: Diagnosis not present

## 2021-01-18 DIAGNOSIS — J449 Chronic obstructive pulmonary disease, unspecified: Secondary | ICD-10-CM | POA: Diagnosis not present

## 2021-01-18 DIAGNOSIS — E1140 Type 2 diabetes mellitus with diabetic neuropathy, unspecified: Secondary | ICD-10-CM | POA: Diagnosis not present

## 2021-01-18 DIAGNOSIS — Z9181 History of falling: Secondary | ICD-10-CM | POA: Diagnosis not present

## 2021-01-18 DIAGNOSIS — Z8673 Personal history of transient ischemic attack (TIA), and cerebral infarction without residual deficits: Secondary | ICD-10-CM | POA: Diagnosis not present

## 2021-02-05 DIAGNOSIS — M179 Osteoarthritis of knee, unspecified: Secondary | ICD-10-CM | POA: Diagnosis not present

## 2021-02-05 DIAGNOSIS — E785 Hyperlipidemia, unspecified: Secondary | ICD-10-CM | POA: Diagnosis not present

## 2021-02-05 DIAGNOSIS — N183 Chronic kidney disease, stage 3 unspecified: Secondary | ICD-10-CM | POA: Diagnosis not present

## 2021-02-05 DIAGNOSIS — M1990 Unspecified osteoarthritis, unspecified site: Secondary | ICD-10-CM | POA: Diagnosis not present

## 2021-02-05 DIAGNOSIS — I480 Paroxysmal atrial fibrillation: Secondary | ICD-10-CM | POA: Diagnosis not present

## 2021-02-13 ENCOUNTER — Telehealth: Payer: Self-pay | Admitting: Internal Medicine

## 2021-02-13 NOTE — Chronic Care Management (AMB) (Signed)
  Chronic Care Management   Note  02/13/2021 Name: [name] MRN: [id number] DOB: January 23, 1946  [name] is a 75 y.o. year old male who is a primary care patient of Biagio Borg, MD. I reached out to [employer] by phone today in response to a referral sent by [name]'s PCP, Biagio Borg, MD.   [name] was given information about Chronic Care Management services today including:  CCM service includes personalized support from designated clinical staff supervised by his physician, including individualized plan of care and coordination with other care providers 24/7 contact phone numbers for assistance for urgent and routine care needs. Service will only be billed when office clinical staff spend 20 minutes or more in a month to coordinate care. Only one practitioner may furnish and bill the service in a calendar month. The patient may stop CCM services at any time (effective at the end of the month) by phone call to the office staff.   Patient agreed to services and verbal consent obtained.   Follow up plan:   Lauretta Grill [facility] Scheduler

## 2021-02-18 ENCOUNTER — Telehealth: Payer: Self-pay | Admitting: Internal Medicine

## 2021-02-18 DIAGNOSIS — J449 Chronic obstructive pulmonary disease, unspecified: Secondary | ICD-10-CM | POA: Diagnosis not present

## 2021-02-18 DIAGNOSIS — E1122 Type 2 diabetes mellitus with diabetic chronic kidney disease: Secondary | ICD-10-CM | POA: Diagnosis not present

## 2021-02-18 DIAGNOSIS — E1140 Type 2 diabetes mellitus with diabetic neuropathy, unspecified: Secondary | ICD-10-CM | POA: Diagnosis not present

## 2021-02-18 DIAGNOSIS — Z9181 History of falling: Secondary | ICD-10-CM | POA: Diagnosis not present

## 2021-02-18 DIAGNOSIS — Z8673 Personal history of transient ischemic attack (TIA), and cerebral infarction without residual deficits: Secondary | ICD-10-CM | POA: Diagnosis not present

## 2021-02-18 NOTE — Telephone Encounter (Signed)
Appt already made

## 2021-02-18 NOTE — Telephone Encounter (Signed)
Seeking advice  Daughter calling for advice for wound on leg that has foul smell and yellow puss. Appointment 8/3

## 2021-02-18 NOTE — Telephone Encounter (Signed)
Please to make ROV any provider asap, or to UC if worsening fever, pain, drainage, confusion, weakness, falls or other signficant unusual symptoms

## 2021-02-20 ENCOUNTER — Ambulatory Visit: Payer: Medicare Other | Admitting: Internal Medicine

## 2021-03-12 ENCOUNTER — Other Ambulatory Visit: Payer: Self-pay

## 2021-03-12 DIAGNOSIS — M179 Osteoarthritis of knee, unspecified: Secondary | ICD-10-CM | POA: Diagnosis not present

## 2021-03-12 DIAGNOSIS — E785 Hyperlipidemia, unspecified: Secondary | ICD-10-CM | POA: Diagnosis not present

## 2021-03-12 DIAGNOSIS — M1990 Unspecified osteoarthritis, unspecified site: Secondary | ICD-10-CM | POA: Diagnosis not present

## 2021-03-12 DIAGNOSIS — I480 Paroxysmal atrial fibrillation: Secondary | ICD-10-CM | POA: Diagnosis not present

## 2021-03-12 DIAGNOSIS — N183 Chronic kidney disease, stage 3 unspecified: Secondary | ICD-10-CM | POA: Diagnosis not present

## 2021-03-12 NOTE — Addendum Note (Signed)
Addended by: Biagio Borg on: 03/12/2021 04:30 PM   Modules accepted: Orders

## 2021-03-12 NOTE — Telephone Encounter (Signed)
Please call daughter to discuss getting HH and a shower chair ordered  Please call

## 2021-03-12 NOTE — Telephone Encounter (Signed)
Ok I re-did the shower chair order hardcopy to staff  Not sure what to say or do about the hospital bed - I suspect pt would need to f/u with the [facility] agency directly as we dont normally order particular size of bed

## 2021-03-20 ENCOUNTER — Ambulatory Visit: Payer: Medicare Other | Admitting: Internal Medicine

## 2021-03-21 DIAGNOSIS — E1140 Type 2 diabetes mellitus with diabetic neuropathy, unspecified: Secondary | ICD-10-CM | POA: Diagnosis not present

## 2021-03-21 DIAGNOSIS — J449 Chronic obstructive pulmonary disease, unspecified: Secondary | ICD-10-CM | POA: Diagnosis not present

## 2021-03-21 DIAGNOSIS — Z8673 Personal history of transient ischemic attack (TIA), and cerebral infarction without residual deficits: Secondary | ICD-10-CM | POA: Diagnosis not present

## 2021-03-21 DIAGNOSIS — E1122 Type 2 diabetes mellitus with diabetic chronic kidney disease: Secondary | ICD-10-CM | POA: Diagnosis not present

## 2021-03-21 DIAGNOSIS — Z9181 History of falling: Secondary | ICD-10-CM | POA: Diagnosis not present

## 2021-03-22 ENCOUNTER — Telehealth: Payer: Self-pay

## 2021-03-22 ENCOUNTER — Ambulatory Visit: Payer: Medicare Other | Admitting: Internal Medicine

## 2021-03-22 ENCOUNTER — Other Ambulatory Visit: Payer: Self-pay | Admitting: Internal Medicine

## 2021-03-22 NOTE — Chronic Care Management (AMB) (Signed)
Chronic Care Management Pharmacy Assistant   Name: [name]  MRN: [id number] DOB: 04-18-1946  [name] is an 75 y.o. year old male who presents for his initial CCM visit with the clinical pharmacist.   Recent office visits:  10/22/20 - Biagio Borg MD - Seen for Dysuria - Referred to ED - No medication changes and no follow up noted.  10/29/20 - Office visit - Seen for unsteadiness on feet - No other notes provided.  11/01/20 - Biagio Borg MD - Seen for hospital follow up - [place] ordered - Follow up in 6 weeks  11/05/20 - Office visit - Seen for unsteadiness on feet - No other notes provided.  11/08/20 - Office visit - Seen for unsteadiness on feet - No other notes provided.  11/15/20 Karen Kays NP- Office visit - Seen for unsteadiness on feet - No other notes provided.  12/12/20 Biagio Borg MD - Seen for General Assessment - Start back on Amlodipine 10 mg - Labs ordered - Follow up in 4 months   Recent consult visits:  None noted  Hospital visits:  Medication Reconciliation was completed by comparing discharge summary, patient's EMR and Pharmacy list, and upon discussion with patient.  Admitted to the hospital on 10/22/20 due to Pyelonephritis. Discharge date was 10/26/20. Discharged from [facility]?Medications Started at [facility] Discharge:?? None noted  Medication Changes at Hospital Discharge: None noted  Medications Discontinued at Hospital Discharge: -Stopped amLODipine 10 MG tablet due to unknown reason.   Medications that remain the same after Hospital Discharge:??  -All other medications will remain the same.    Medications: Outpatient Encounter Medications as of 03/22/2021  Medication Sig   acetaminophen (TYLENOL) 325 MG tablet Take 2 tablets (650 mg total) by mouth every 6 (six) hours as needed for mild pain or moderate pain.   apixaban (ELIQUIS) 5 MG TABS tablet TAKE 1 TABLET BY MOUTH  TWICE  DAILY   furosemide (LASIX) 40 MG tablet Take 1 tablet (40 mg total) by mouth daily as needed for fluid or edema.   Lancet Devices (ONETOUCH DELICA PLUS LANCING) MISC    Lancets (ONETOUCH DELICA PLUS [id number]) MISC USE ONCE DAILY   loratadine (CLARITIN) 10 MG tablet Take 10 mg by mouth daily as needed for allergies.   metoprolol tartrate (LOPRESSOR) 25 MG tablet Take 1 tablet (25 mg total) by mouth 2 (two) times daily.   polyethylene glycol (MIRALAX / GLYCOLAX) 17 g packet Take 17 g by mouth daily as needed for moderate constipation.   potassium chloride (KLOR-CON) 10 MEQ tablet Take 1 tablet (10 mEq total) by mouth daily.   pravastatin (PRAVACHOL) 40 MG tablet Take 1 tablet (40 mg total) by mouth daily.   PROAIR HFA 108 (90 Base) MCG/ACT inhaler INHALE 2 PUFFS INTO THE  LUNGS EVERY 6 HOURS AS  NEEDED (Patient taking differently: Inhale 2 puffs into the lungs every 6 (six) hours as needed for shortness of breath or wheezing.)   sulfamethoxazole-trimethoprim (BACTRIM DS) 800-160 MG tablet Take 1 tablet by mouth 2 (two) times daily.   traZODone (DESYREL) 50 MG tablet Take 0.5-1 tablets (25-50 mg total) by mouth at bedtime as needed for sleep.   No facility-administered encounter medications on file as of 03/22/2021.   apixaban (ELIQUIS) 5 MG TABS tablet Last filled:01/19/21 90 DS furosemide (LASIX) 40 MG tablet (Expired) Last filled: 11/10/20 30 DS loratadine (CLARITIN) 10 MG tablet Last fill  date unknown  metoprolol tartrate (LOPRESSOR) 25 MG tablet Last filled: 12/04/20 90 DS potassium chloride (KLOR-CON) 10 MEQ tablet Last filled: 02/09/21 90 DS pravastatin (PRAVACHOL) 40 MG tablet Last filled: 01/19/21 90 DS PROAIR HFA 108 (90 Base) MCG/ACT inhaler Last filled:06/01/20 traZODone (DESYREL) 50 MG tablet Last fill date unknown    Star Rating Drugs: pravastatin (PRAVACHOL) 40 MG tablet Last filled: 01/19/21 90 DS   Andee Poles, CMA

## 2021-03-27 ENCOUNTER — Telehealth: Payer: Medicare Other

## 2021-04-03 DIAGNOSIS — I480 Paroxysmal atrial fibrillation: Secondary | ICD-10-CM | POA: Diagnosis not present

## 2021-04-03 DIAGNOSIS — M1990 Unspecified osteoarthritis, unspecified site: Secondary | ICD-10-CM | POA: Diagnosis not present

## 2021-04-03 DIAGNOSIS — M179 Osteoarthritis of knee, unspecified: Secondary | ICD-10-CM | POA: Diagnosis not present

## 2021-04-03 DIAGNOSIS — N183 Chronic kidney disease, stage 3 unspecified: Secondary | ICD-10-CM | POA: Diagnosis not present

## 2021-04-19 DIAGNOSIS — I480 Paroxysmal atrial fibrillation: Secondary | ICD-10-CM | POA: Diagnosis not present

## 2021-04-19 DIAGNOSIS — N183 Chronic kidney disease, stage 3 unspecified: Secondary | ICD-10-CM | POA: Diagnosis not present

## 2021-04-19 DIAGNOSIS — M1990 Unspecified osteoarthritis, unspecified site: Secondary | ICD-10-CM | POA: Diagnosis not present

## 2021-04-19 DIAGNOSIS — M179 Osteoarthritis of knee, unspecified: Secondary | ICD-10-CM | POA: Diagnosis not present

## 2021-04-20 DIAGNOSIS — E1122 Type 2 diabetes mellitus with diabetic chronic kidney disease: Secondary | ICD-10-CM | POA: Diagnosis not present

## 2021-04-20 DIAGNOSIS — J449 Chronic obstructive pulmonary disease, unspecified: Secondary | ICD-10-CM | POA: Diagnosis not present

## 2021-04-20 DIAGNOSIS — E1140 Type 2 diabetes mellitus with diabetic neuropathy, unspecified: Secondary | ICD-10-CM | POA: Diagnosis not present

## 2021-04-20 DIAGNOSIS — Z9181 History of falling: Secondary | ICD-10-CM | POA: Diagnosis not present

## 2021-04-20 DIAGNOSIS — Z8673 Personal history of transient ischemic attack (TIA), and cerebral infarction without residual deficits: Secondary | ICD-10-CM | POA: Diagnosis not present

## 2021-04-24 ENCOUNTER — Ambulatory Visit: Payer: Medicare Other | Admitting: Internal Medicine

## 2021-05-08 DIAGNOSIS — M1990 Unspecified osteoarthritis, unspecified site: Secondary | ICD-10-CM | POA: Diagnosis not present

## 2021-05-08 DIAGNOSIS — I480 Paroxysmal atrial fibrillation: Secondary | ICD-10-CM | POA: Diagnosis not present

## 2021-05-08 DIAGNOSIS — M179 Osteoarthritis of knee, unspecified: Secondary | ICD-10-CM | POA: Diagnosis not present

## 2021-05-08 DIAGNOSIS — N183 Chronic kidney disease, stage 3 unspecified: Secondary | ICD-10-CM | POA: Diagnosis not present

## 2021-05-21 DIAGNOSIS — Z9181 History of falling: Secondary | ICD-10-CM | POA: Diagnosis not present

## 2021-05-21 DIAGNOSIS — Z8673 Personal history of transient ischemic attack (TIA), and cerebral infarction without residual deficits: Secondary | ICD-10-CM | POA: Diagnosis not present

## 2021-05-21 DIAGNOSIS — E1122 Type 2 diabetes mellitus with diabetic chronic kidney disease: Secondary | ICD-10-CM | POA: Diagnosis not present

## 2021-05-21 DIAGNOSIS — E1140 Type 2 diabetes mellitus with diabetic neuropathy, unspecified: Secondary | ICD-10-CM | POA: Diagnosis not present

## 2021-05-21 DIAGNOSIS — J449 Chronic obstructive pulmonary disease, unspecified: Secondary | ICD-10-CM | POA: Diagnosis not present

## 2021-06-10 DIAGNOSIS — M179 Osteoarthritis of knee, unspecified: Secondary | ICD-10-CM | POA: Diagnosis not present

## 2021-06-10 DIAGNOSIS — N183 Chronic kidney disease, stage 3 unspecified: Secondary | ICD-10-CM | POA: Diagnosis not present

## 2021-06-10 DIAGNOSIS — I480 Paroxysmal atrial fibrillation: Secondary | ICD-10-CM | POA: Diagnosis not present

## 2021-06-10 DIAGNOSIS — M1990 Unspecified osteoarthritis, unspecified site: Secondary | ICD-10-CM | POA: Diagnosis not present

## 2021-06-20 DIAGNOSIS — E1122 Type 2 diabetes mellitus with diabetic chronic kidney disease: Secondary | ICD-10-CM | POA: Diagnosis not present

## 2021-06-20 DIAGNOSIS — J449 Chronic obstructive pulmonary disease, unspecified: Secondary | ICD-10-CM | POA: Diagnosis not present

## 2021-06-20 DIAGNOSIS — Z9181 History of falling: Secondary | ICD-10-CM | POA: Diagnosis not present

## 2021-06-20 DIAGNOSIS — E1140 Type 2 diabetes mellitus with diabetic neuropathy, unspecified: Secondary | ICD-10-CM | POA: Diagnosis not present

## 2021-06-20 DIAGNOSIS — Z8673 Personal history of transient ischemic attack (TIA), and cerebral infarction without residual deficits: Secondary | ICD-10-CM | POA: Diagnosis not present

## 2021-07-03 ENCOUNTER — Ambulatory Visit: Payer: Medicare Other | Admitting: Internal Medicine

## 2021-07-08 ENCOUNTER — Encounter: Payer: Self-pay | Admitting: Internal Medicine

## 2021-07-08 ENCOUNTER — Other Ambulatory Visit: Payer: Self-pay

## 2021-07-08 ENCOUNTER — Ambulatory Visit (INDEPENDENT_AMBULATORY_CARE_PROVIDER_SITE_OTHER): Payer: Medicare Other | Admitting: Internal Medicine

## 2021-07-08 ENCOUNTER — Other Ambulatory Visit (INDEPENDENT_AMBULATORY_CARE_PROVIDER_SITE_OTHER): Payer: Medicare Other

## 2021-07-08 VITALS — BP 136/80 | HR 44 | Temp 98.8°F | Ht 73.0 in

## 2021-07-08 DIAGNOSIS — R600 Localized edema: Secondary | ICD-10-CM

## 2021-07-08 DIAGNOSIS — L98499 Non-pressure chronic ulcer of skin of other sites with unspecified severity: Secondary | ICD-10-CM

## 2021-07-08 DIAGNOSIS — L089 Local infection of the skin and subcutaneous tissue, unspecified: Secondary | ICD-10-CM

## 2021-07-08 DIAGNOSIS — M179 Osteoarthritis of knee, unspecified: Secondary | ICD-10-CM | POA: Diagnosis not present

## 2021-07-08 DIAGNOSIS — N183 Chronic kidney disease, stage 3 unspecified: Secondary | ICD-10-CM | POA: Diagnosis not present

## 2021-07-08 DIAGNOSIS — M1990 Unspecified osteoarthritis, unspecified site: Secondary | ICD-10-CM | POA: Diagnosis not present

## 2021-07-08 MED ORDER — DOXYCYCLINE HYCLATE 100 MG PO TABS: 100.0000 mg | ORAL_TABLET | Freq: Two times a day (BID) | ORAL | 0 refills | Status: AC

## 2021-07-08 NOTE — Patient Instructions (Addendum)
° ° °  Medications changes include :   doxycycline 100 mg twice a day for 10 days, increase the furosemide to 40 mg twice a day for 2 days.   Double up the potassium x 2 days.   Your prescription(s) have been submitted to your pharmacy. Please take as directed and contact our office if you believe you are having problem(s) with the medication(s).   Elevated your legs as much as possible.    Use the hydrocolloid dressing.    Schedule a follow up with Dr Jenny Reichmann for 2 weeks.

## 2021-07-08 NOTE — Progress Notes (Signed)
Subjective:    Patient ID: [name], male    DOB: 08/15/1945, 75 y.o.   MRN: [id number]  This visit occurred during the SARS-CoV-2 public health emergency.  Safety protocols were in place, including screening questions prior to the visit, additional usage of staff PPE, and extensive cleaning of exam room while observing appropriate contact time as indicated for disinfecting solutions.    HPI The patient is here for an acute visit.  He has chronic LE edema and when it is more severe she develops blisters and they can turn into wounds.    Right lower leg wound - started about 10 days ago.  His daughter put an antimicrobial patch on it initially and it was burning and stinging - then put hydrocolloid patch. She noticed today there was drainage that was not clear and had an odor.  He denies fever or chills.        Medications and allergies reviewed with patient and updated if appropriate.  Patient Active Problem List   Diagnosis Date Noted   Gait disorder 12/16/2020   HTN (hypertension) 12/16/2020   Aortic atherosclerosis ([place]) 12/12/2020   Diverticulitis 10/23/2020   Fall 10/22/2020   Sepsis ([place]) 10/22/2020   Acute lower UTI 10/22/2020   NSVT (nonsustained ventricular tachycardia)    Multiple myeloma ([place])    AKI (acute kidney injury) ([place]) 02/15/2020   General weakness    Diabetic leg ulcer ([place]) 10/06/2019   CKD (chronic kidney disease) stage 3, GFR 30-59 ml/min ([place]) 10/06/2019   Diabetic ulcer of right lower leg ([place]) 02/23/2019   Abnormal LFTs 04/27/2018   Open wound of scrotum 03/01/2018   Acute on chronic diastolic CHF (congestive heart failure) ([place]) 02/25/2018   Postoperative anemia due to acute blood loss 02/25/2018   Occipital stroke ([place]), left 09/23/2017   Diabetes mellitus type II, non insulin dependent ([place])    Encounter for well adult exam with abnormal findings 07/31/2017   Venous insufficiency (chronic) (peripheral) with chronic lower  extremity edema 04/04/2016   Junctional tachycardia ([place]) 03/18/2015   Exertional dyspnea 07/05/2014   Obesity (BMI 30-39.9) 05/04/2014   Benign essential tremor 05/04/2014   Peripheral neuropathy 12/21/2013   Nonspecific abnormal finding in stool contents 09/06/2012   Heme positive stool 08/09/2012   Difficulty passing stool 08/09/2012   Sebaceous cyst 04/15/2012   Complex renal cyst 01/13/2011   Back pain 01/02/2011   Permanent atrial fibrillation ([place]) - now off amiodarone; asymptomatic. CHA2DS2-VASc Score -5 (on Xarelto) 01/02/2011   KNEE PAIN, LEFT 08/27/2009   COLONIC POLYPS, HX OF 08/08/2008   ERECTILE DYSFUNCTION 09/27/2007   ULNAR NEUROPATHY 09/27/2007   VENTRICULAR HYPERTROPHY, LEFT 09/27/2007   ALLERGIC RHINITIS 09/27/2007   Arthropathy 09/27/2007   GANGLION CYST, WRIST, LEFT 09/27/2007   HEMORRHOIDS, INTERNAL 06/10/2007   DIVERTICULOSIS, COLON 06/10/2007   DISC DISEASE, LUMBAR 06/03/2007   NEPHROLITHIASIS, HX OF 06/03/2007   Diabetes mellitus with neuropathy ([place]) 02/11/2007   Hyperlipidemia with target LDL less than 100 02/11/2007   ANXIETY 02/11/2007   Nondependent Alcohol Abuse, in Remission 02/11/2007   Hypertensive heart disease with chronic diastolic congestive heart failure ([place]) 02/11/2007   COPD (chronic obstructive pulmonary disease) ([place]) 02/11/2007   BENIGN PROSTATIC HYPERTROPHY 02/11/2007   LOW BACK PAIN 02/11/2007   Disturbance of skin sensation 02/11/2007   PROSTATE CANCER, HX OF 02/11/2007    Current Outpatient Medications on File Prior to Visit  Medication Sig Dispense Refill   acetaminophen (TYLENOL) 325 MG tablet Take 2 tablets (  650 mg total) by mouth every 6 (six) hours as needed for mild pain or moderate pain.     amLODipine (NORVASC) 10 MG tablet Take 10 mg by mouth daily.     apixaban (ELIQUIS) 5 MG TABS tablet TAKE 1 TABLET BY MOUTH  TWICE DAILY 180 tablet 2   Lancet Devices (ONETOUCH DELICA PLUS LANCING) MISC      Lancets (ONETOUCH  DELICA PLUS [id number]) MISC USE ONCE DAILY 100 each 3   loratadine (CLARITIN) 10 MG tablet Take 10 mg by mouth daily as needed for allergies.     metoprolol tartrate (LOPRESSOR) 25 MG tablet Take 1 tablet (25 mg total) by mouth 2 (two) times daily. 180 tablet 3   polyethylene glycol (MIRALAX / GLYCOLAX) 17 g packet Take 17 g by mouth daily as needed for moderate constipation. 7 each 0   potassium chloride (KLOR-CON) 10 MEQ tablet Take 1 tablet (10 mEq total) by mouth daily. 90 tablet 3   pravastatin (PRAVACHOL) 40 MG tablet Take 1 tablet (40 mg total) by mouth daily. 90 tablet 3   PROAIR HFA 108 (90 Base) MCG/ACT inhaler INHALE 2 PUFFS INTO THE  LUNGS EVERY 6 HOURS AS  NEEDED (Patient taking differently: Inhale 2 puffs into the lungs every 6 (six) hours as needed for shortness of breath or wheezing.) 34 g 3   sulfamethoxazole-trimethoprim (BACTRIM DS) 800-160 MG tablet Take 1 tablet by mouth 2 (two) times daily. 20 tablet 0   furosemide (LASIX) 40 MG tablet Take 1 tablet (40 mg total) by mouth daily as needed for fluid or edema. 30 tablet 0   No current facility-administered medications on file prior to visit.    Past Medical History:  Diagnosis Date   Adenoma 05/2008   Alcoholism in recovery [facility])    BPH (benign prostatic hyperplasia)    Cancer [facility])    Prostate   Chronic LBP    Hip & Back -- Sees Dr. Maia Petties @ [facility]   Colon polyps    Complex renal cyst 12/2010   Controlled type 2 diabetes mellitus with neuropathy ([place]) 01/2007   COPD (chronic obstructive pulmonary disease) ([place])    Diabetes ([place])    Diverticulitis of colon    ED (erectile dysfunction)    s/p Penile prosthesis (09/2010)   History of prostate cancer    Dr. Karsten Ro   History of sick sinus syndrome    reduced BB dose for Bradycardia   HLD (hyperlipidemia)    Hypertension    Impaired glucose tolerance 12/21/2013   Nephrolithiasis    Osteoarthritis of both hips    Osteoarthritis of lumbosacral spine     with Disc Disease   PAF (paroxysmal atrial fibrillation) ([facility])    No longer on Amiodarone.  Not on Anticoaguation b/c no recurrence.   Paresthesias/numbness    Bilateral LE   Peripheral neuropathy 12/21/2013    Past Surgical History:  Procedure Laterality Date   LEFT HEART CATH AND CORONARY ANGIOGRAPHY  2003   Normal Coronary Arteries.   NM MYOVIEW LTD  02/21/2020   EF 60%.  Medium sized mild severity defect in the basal inferior, mid inferior and apical inferior location-suggesting of ischemia.  Read as low-intermediate risk.  (On cardiology review this is felt to be a fixed defect either related to prior infarct versus diaphragmatic attenuation.  Felt to be low risk)   PENILE PROSTHESIS IMPLANT  06/21/2012   Procedure: PENILE PROTHESIS INFLATABLE;  Surgeon: Claybon Jabs, MD;  Location:  [facility];  Service: Urology;  Laterality: N/A;  REMOVAL AND REPLACEMENT OF SOME  OF PROSTHESIS ([facility])    PENILE PROSTHESIS PLACEMENT  09/2010   PROSTATECTOMY     REMOVAL OF PENILE PROSTHESIS N/A 02/15/2018   Procedure: REMOVAL OF PENILE PROSTHESIS;  Surgeon: Kathie Rhodes, MD;  Location: [facility];  Service: Urology;  Laterality: N/A;   TRANSTHORACIC ECHOCARDIOGRAM  04/2016   a) Relatively normal EF of 55-60%. No RWMA suggesting no prior MI. Gr 2 DD (pseudo-normal filling pattern) along with moderately dilated left atrium.;; b) 02/2018: Mod LVH. EF 65-70%. No RWMA.  Unable to assess DF 2/2 Afib. Mild MR. Severe BiAtrial Enlargement. High CVP.   TRANSTHORACIC ECHOCARDIOGRAM  02/17/2020   EF 60 to 65%.  No R WMA.  Unable to assess diastolic parameters because of A. fib.  Mildly elevated PA pressures.  Mild LA dilation.  Mild aortic valve sclerosis but no stenosis.  Normal IVC.    Social History   Socioeconomic History   Marital status: Single    Spouse name: Not on file   Number of children: 3   Years of education: Not on file   Highest education level: Not on file  Occupational History    Occupation: Retired  Tobacco Use   Smoking status: Former    Types: Cigarettes    Quit date: 11/04/1982    Years since quitting: 38.7   Smokeless tobacco: Never  Vaping Use   Vaping Use: Never used  Substance and Sexual Activity   Alcohol use: Yes    Comment: no alcohol x 1 week wants to stop   Drug use: No    Types: Marijuana    Comment: use to smoke marijuana   Sexual activity: Not on file  Other Topics Concern   Not on file  Social History Narrative   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   Divorced. 3 grown children. 4 GC.     Quit smoking ~20+ yrs ago.    Social Determinants of Health   Financial Resource Strain: Not on file  Food Insecurity: Not on file  Transportation Needs: Not on file  Physical Activity: Not on file  Stress: Not on file  Social Connections: Not on file    Family History  Problem Relation Age of Onset   Coronary artery disease Mother    Heart attack Mother    Coronary artery disease Father    Prostate cancer Father    Diabetes Father     Review of Systems  Constitutional:  Negative for chills and fever.  Respiratory:  Negative for shortness of breath.   Cardiovascular:  Positive for leg swelling. Negative for chest pain and palpitations.  Skin:  Positive for wound.      Objective:   Vitals:   07/08/21 1547  BP: 136/80  Pulse: (!) 44  Temp: 98.8 F (37.1 C)  SpO2: 97%   BP Readings from Last 3 Encounters:  07/08/21 136/80  12/12/20 (!) 148/74  11/01/20 130/90   Wt Readings from Last 3 Encounters:  12/12/20 273 lb (123.8 kg)  08/23/20 270 lb 12.8 oz (122.8 kg)  03/29/20 249 lb 4.8 oz (113.1 kg)   Body mass index is 36.02 kg/m.   Physical Exam Constitutional:      General: He is not in acute distress.    Appearance: Normal appearance. He is not ill-appearing.  HENT:     Head: Normocephalic and atraumatic.  Musculoskeletal:  Right lower leg: Edema (2 +) present.     Left  lower leg: Edema (2+) present.  Skin:    General: Skin is warm and dry.     Comments: Ulcer right anterior mid lower leg w/o bleeding but there is some clearish discharge.  No surrounding fluctuance.  Wound area approximately 3x4 inches  Neurological:     Mental Status: He is alert.           Assessment & Plan:    B/l leg edema: Acute on chronic Swelling is worse than usual Take lasix 40 g bid x 2 days and then return to 40 mg daily He is not elevating his legs as much as he should  - - stressed the importance of elevating his legs    Infected right lower extremity ulcer: Acute Started 10 days ago Concern for infection Start doxycycline 100 gm bid x 10 days Hydrocolloid dressing May need home nurse for wound care if not improving.     BMP today

## 2021-07-09 ENCOUNTER — Other Ambulatory Visit: Payer: Self-pay | Admitting: Cardiology

## 2021-07-09 LAB — BASIC METABOLIC PANEL
BUN: 23 mg/dL (ref 6–23)
CO2: 26 mEq/L (ref 19–32)
Calcium: 9.1 mg/dL (ref 8.4–10.5)
Chloride: 102 mEq/L (ref 96–112)
Creatinine, Ser: 1.86 mg/dL — ABNORMAL HIGH (ref 0.40–1.50)
GFR: 35.19 mL/min — ABNORMAL LOW (ref >60.00)
Glucose, Bld: 105 mg/dL — ABNORMAL HIGH (ref 70–99)
Potassium: 3.8 mEq/L (ref 3.5–5.1)
Sodium: 135 mEq/L (ref 135–145)

## 2021-07-21 DIAGNOSIS — Z9181 History of falling: Secondary | ICD-10-CM | POA: Diagnosis not present

## 2021-07-21 DIAGNOSIS — Z8673 Personal history of transient ischemic attack (TIA), and cerebral infarction without residual deficits: Secondary | ICD-10-CM | POA: Diagnosis not present

## 2021-07-21 DIAGNOSIS — J449 Chronic obstructive pulmonary disease, unspecified: Secondary | ICD-10-CM | POA: Diagnosis not present

## 2021-07-21 DIAGNOSIS — E1122 Type 2 diabetes mellitus with diabetic chronic kidney disease: Secondary | ICD-10-CM | POA: Diagnosis not present

## 2021-07-21 DIAGNOSIS — E1140 Type 2 diabetes mellitus with diabetic neuropathy, unspecified: Secondary | ICD-10-CM | POA: Diagnosis not present

## 2021-07-23 ENCOUNTER — Ambulatory Visit: Payer: Medicare Other | Admitting: Internal Medicine

## 2021-07-26 DIAGNOSIS — N183 Chronic kidney disease, stage 3 unspecified: Secondary | ICD-10-CM | POA: Diagnosis not present

## 2021-07-26 DIAGNOSIS — I480 Paroxysmal atrial fibrillation: Secondary | ICD-10-CM | POA: Diagnosis not present

## 2021-07-26 DIAGNOSIS — M1990 Unspecified osteoarthritis, unspecified site: Secondary | ICD-10-CM | POA: Diagnosis not present

## 2021-07-26 DIAGNOSIS — M179 Osteoarthritis of knee, unspecified: Secondary | ICD-10-CM | POA: Diagnosis not present

## 2021-08-21 DIAGNOSIS — E1140 Type 2 diabetes mellitus with diabetic neuropathy, unspecified: Secondary | ICD-10-CM | POA: Diagnosis not present

## 2021-08-21 DIAGNOSIS — E1122 Type 2 diabetes mellitus with diabetic chronic kidney disease: Secondary | ICD-10-CM | POA: Diagnosis not present

## 2021-08-21 DIAGNOSIS — Z8673 Personal history of transient ischemic attack (TIA), and cerebral infarction without residual deficits: Secondary | ICD-10-CM | POA: Diagnosis not present

## 2021-08-21 DIAGNOSIS — J449 Chronic obstructive pulmonary disease, unspecified: Secondary | ICD-10-CM | POA: Diagnosis not present

## 2021-08-21 DIAGNOSIS — Z9181 History of falling: Secondary | ICD-10-CM | POA: Diagnosis not present

## 2021-09-17 DIAGNOSIS — M179 Osteoarthritis of knee, unspecified: Secondary | ICD-10-CM | POA: Diagnosis not present

## 2021-09-17 DIAGNOSIS — I480 Paroxysmal atrial fibrillation: Secondary | ICD-10-CM | POA: Diagnosis not present

## 2021-09-17 DIAGNOSIS — N183 Chronic kidney disease, stage 3 unspecified: Secondary | ICD-10-CM | POA: Diagnosis not present

## 2021-09-17 DIAGNOSIS — M1990 Unspecified osteoarthritis, unspecified site: Secondary | ICD-10-CM | POA: Diagnosis not present

## 2021-09-18 DIAGNOSIS — E1122 Type 2 diabetes mellitus with diabetic chronic kidney disease: Secondary | ICD-10-CM | POA: Diagnosis not present

## 2021-09-18 DIAGNOSIS — Z8673 Personal history of transient ischemic attack (TIA), and cerebral infarction without residual deficits: Secondary | ICD-10-CM | POA: Diagnosis not present

## 2021-09-18 DIAGNOSIS — E1140 Type 2 diabetes mellitus with diabetic neuropathy, unspecified: Secondary | ICD-10-CM | POA: Diagnosis not present

## 2021-09-18 DIAGNOSIS — J449 Chronic obstructive pulmonary disease, unspecified: Secondary | ICD-10-CM | POA: Diagnosis not present

## 2021-09-18 DIAGNOSIS — Z9181 History of falling: Secondary | ICD-10-CM | POA: Diagnosis not present

## 2021-09-22 ENCOUNTER — Other Ambulatory Visit: Payer: Self-pay | Admitting: Cardiology

## 2021-09-27 ENCOUNTER — Other Ambulatory Visit: Payer: Self-pay | Admitting: Cardiology

## 2021-10-01 DIAGNOSIS — I480 Paroxysmal atrial fibrillation: Secondary | ICD-10-CM | POA: Diagnosis not present

## 2021-10-01 DIAGNOSIS — M179 Osteoarthritis of knee, unspecified: Secondary | ICD-10-CM | POA: Diagnosis not present

## 2021-10-01 DIAGNOSIS — M1990 Unspecified osteoarthritis, unspecified site: Secondary | ICD-10-CM | POA: Diagnosis not present

## 2021-10-01 DIAGNOSIS — N183 Chronic kidney disease, stage 3 unspecified: Secondary | ICD-10-CM | POA: Diagnosis not present

## 2021-10-18 ENCOUNTER — Other Ambulatory Visit: Payer: Self-pay | Admitting: Internal Medicine

## 2021-10-18 ENCOUNTER — Other Ambulatory Visit: Payer: Self-pay | Admitting: Cardiology

## 2021-10-18 NOTE — Telephone Encounter (Signed)
Please refill as per office routine med refill policy (all routine meds to be refilled for 3 mo or monthly (per pt preference) up to one year from last visit, then month to month grace period for 3 mo, then further med refills will have to be denied) ? ?

## 2021-11-08 DIAGNOSIS — I480 Paroxysmal atrial fibrillation: Secondary | ICD-10-CM | POA: Diagnosis not present

## 2021-11-08 DIAGNOSIS — M179 Osteoarthritis of knee, unspecified: Secondary | ICD-10-CM | POA: Diagnosis not present

## 2021-11-08 DIAGNOSIS — N183 Chronic kidney disease, stage 3 unspecified: Secondary | ICD-10-CM | POA: Diagnosis not present

## 2021-11-08 DIAGNOSIS — M1990 Unspecified osteoarthritis, unspecified site: Secondary | ICD-10-CM | POA: Diagnosis not present

## 2021-11-11 ENCOUNTER — Ambulatory Visit: Payer: Medicare Other | Admitting: Internal Medicine

## 2021-11-18 ENCOUNTER — Encounter: Payer: Self-pay | Admitting: Internal Medicine

## 2021-11-18 ENCOUNTER — Ambulatory Visit (INDEPENDENT_AMBULATORY_CARE_PROVIDER_SITE_OTHER): Payer: Medicare Other | Admitting: Internal Medicine

## 2021-11-18 VITALS — BP 148/82 | HR 49 | Temp 97.6°F | Ht 73.0 in | Wt 274.0 lb

## 2021-11-18 DIAGNOSIS — I10 Essential (primary) hypertension: Secondary | ICD-10-CM | POA: Diagnosis not present

## 2021-11-18 DIAGNOSIS — R600 Localized edema: Secondary | ICD-10-CM | POA: Diagnosis not present

## 2021-11-18 DIAGNOSIS — N1832 Chronic kidney disease, stage 3b: Secondary | ICD-10-CM

## 2021-11-18 DIAGNOSIS — I700 Atherosclerosis of aorta: Secondary | ICD-10-CM | POA: Diagnosis not present

## 2021-11-18 DIAGNOSIS — E1140 Type 2 diabetes mellitus with diabetic neuropathy, unspecified: Secondary | ICD-10-CM | POA: Diagnosis not present

## 2021-11-18 DIAGNOSIS — Z0001 Encounter for general adult medical examination with abnormal findings: Secondary | ICD-10-CM | POA: Diagnosis not present

## 2021-11-18 DIAGNOSIS — E559 Vitamin D deficiency, unspecified: Secondary | ICD-10-CM | POA: Diagnosis not present

## 2021-11-18 DIAGNOSIS — R269 Unspecified abnormalities of gait and mobility: Secondary | ICD-10-CM

## 2021-11-18 DIAGNOSIS — Z1211 Encounter for screening for malignant neoplasm of colon: Secondary | ICD-10-CM

## 2021-11-18 DIAGNOSIS — R001 Bradycardia, unspecified: Secondary | ICD-10-CM | POA: Diagnosis not present

## 2021-11-18 DIAGNOSIS — R5383 Other fatigue: Secondary | ICD-10-CM

## 2021-11-18 DIAGNOSIS — E785 Hyperlipidemia, unspecified: Secondary | ICD-10-CM

## 2021-11-18 DIAGNOSIS — E538 Deficiency of other specified B group vitamins: Secondary | ICD-10-CM | POA: Diagnosis not present

## 2021-11-18 LAB — CBC WITH DIFFERENTIAL/PLATELET
Basophils Absolute: 0 10*3/uL (ref 0.0–0.1)
Basophils Relative: 0.5 % (ref 0.0–3.0)
Eosinophils Absolute: 0.1 10*3/uL (ref 0.0–0.7)
Eosinophils Relative: 1.4 % (ref 0.0–5.0)
HCT: 42.6 % (ref 39.0–52.0)
Hemoglobin: 13.7 g/dL (ref 13.0–17.0)
Lymphocytes Relative: 24.1 % (ref 12.0–46.0)
Lymphs Abs: 1.3 10*3/uL (ref 0.7–4.0)
MCHC: 32.1 g/dL (ref 30.0–36.0)
MCV: 86.1 fl (ref 78.0–100.0)
Monocytes Absolute: 0.5 10*3/uL (ref 0.1–1.0)
Monocytes Relative: 8.2 % (ref 3.0–12.0)
Neutro Abs: 3.7 10*3/uL (ref 1.4–7.7)
Neutrophils Relative %: 65.8 % (ref 43.0–77.0)
Platelets: 214 10*3/uL (ref 150.0–400.0)
RBC: 4.95 Mil/uL (ref 4.22–5.81)
RDW: 15.4 % (ref 11.5–15.5)
WBC: 5.6 10*3/uL (ref 4.0–10.5)

## 2021-11-18 LAB — BASIC METABOLIC PANEL
BUN: 19 mg/dL (ref 6–23)
CO2: 25 mEq/L (ref 19–32)
Calcium: 8.8 mg/dL (ref 8.4–10.5)
Chloride: 105 mEq/L (ref 96–112)
Creatinine, Ser: 1.58 mg/dL — ABNORMAL HIGH (ref 0.40–1.50)
GFR: 42.69 mL/min — ABNORMAL LOW (ref >60.00)
Glucose, Bld: 121 mg/dL — ABNORMAL HIGH (ref 70–99)
Potassium: 4.3 mEq/L (ref 3.5–5.1)
Sodium: 138 mEq/L (ref 135–145)

## 2021-11-18 LAB — URINALYSIS, ROUTINE W REFLEX MICROSCOPIC
Bilirubin Urine: NEGATIVE
Ketones, ur: NEGATIVE
Nitrite: NEGATIVE
Specific Gravity, Urine: 1.015 (ref 1.000–1.030)
Total Protein, Urine: >=300 — AB
Urine Glucose: NEGATIVE
Urobilinogen, UA: 0.2 (ref 0.0–1.0)
pH: 6 (ref 5.0–8.0)

## 2021-11-18 LAB — HEPATIC FUNCTION PANEL
ALT: 8 U/L (ref 0–53)
AST: 14 U/L (ref 0–37)
Albumin: 3.1 g/dL — ABNORMAL LOW (ref 3.5–5.2)
Alkaline Phosphatase: 141 U/L — ABNORMAL HIGH (ref 39–117)
Bilirubin, Direct: 0 mg/dL (ref 0.0–0.3)
Total Bilirubin: 0.4 mg/dL (ref 0.2–1.2)
Total Protein: 7.6 g/dL (ref 6.0–8.3)

## 2021-11-18 LAB — TSH: TSH: 1.52 u[IU]/mL (ref 0.35–5.50)

## 2021-11-18 LAB — LIPID PANEL
Cholesterol: 144 mg/dL (ref 0–200)
HDL: 39 mg/dL — ABNORMAL LOW (ref >39.00)
LDL Cholesterol: 93 mg/dL (ref 0–99)
NonHDL: 105.46
Total CHOL/HDL Ratio: 4
Triglycerides: 64 mg/dL (ref 0.0–149.0)
VLDL: 12.8 mg/dL (ref 0.0–40.0)

## 2021-11-18 LAB — MICROALBUMIN / CREATININE URINE RATIO
Creatinine,U: 37.5 mg/dL
Microalb Creat Ratio: 470.2 mg/g — ABNORMAL HIGH (ref 0.0–30.0)
Microalb, Ur: 176.5 mg/dL — ABNORMAL HIGH (ref 0.0–1.9)

## 2021-11-18 LAB — TESTOSTERONE: Testosterone: 275.29 ng/dL — ABNORMAL LOW (ref 300.00–890.00)

## 2021-11-18 LAB — VITAMIN D 25 HYDROXY (VIT D DEFICIENCY, FRACTURES): VITD: 10.1 ng/mL — ABNORMAL LOW (ref 30.00–100.00)

## 2021-11-18 LAB — HEMOGLOBIN A1C: Hgb A1c MFr Bld: 6.6 % — ABNORMAL HIGH (ref 4.6–6.5)

## 2021-11-18 LAB — PSA: PSA: 0 ng/mL — ABNORMAL LOW (ref 0.10–4.00)

## 2021-11-18 LAB — VITAMIN B12: Vitamin B-12: 273 pg/mL (ref 211–911)

## 2021-11-18 MED ORDER — METOPROLOL TARTRATE 25 MG PO TABS: 12.5000 mg | ORAL_TABLET | Freq: Two times a day (BID) | ORAL | 3 refills | Status: DC

## 2021-11-18 NOTE — Progress Notes (Signed)
Patient ID: [name], male   DOB: 03-05-1946, 76 y.o.   MRN: [id number] ? ? ? ?     Chief Complaint:: wellness exam and Office Visit (Discuss rehabilitation services) ? , dm, gait d/o, bradycardia ? ?     HPI:  [name] is a 76 y.o. male here for wellness exam; due for colonscopy, eye exam o/w up to date ?         ?              Also also asks for podiatry for DM and foot care.  Also with ongoing gait d/o, with daughter not satisifed with most recent home PT only coming once per wk, and no singificant progress seen.  Wants to try outpatient PT eval.  BP has been < 140/90 at home.  Pt denies chest pain, increased sob or doe, wheezing, orthopnea, PND, increased LE swelling, palpitations, dizziness or syncope.   Pt denies polydipsia, polyuria, or new focal neuro s/s.    Pt denies fever, wt loss, night sweats, loss of appetite, or other constitutional symptoms  No other new complaints except Does c/o ongoing fatigue, but denies signficant daytime hypersomnolence.  ?Wt Readings from Last 3 Encounters:  ?11/18/21 274 lb (124.3 kg)  ?12/12/20 273 lb (123.8 kg)  ?08/23/20 270 lb 12.8 oz (122.8 kg)  ? ?BP Readings from Last 3 Encounters:  ?11/18/21 (!) 148/82  ?07/08/21 136/80  ?12/12/20 (!) 148/74  ? ?Immunization History  ?Administered Date(s) Administered  ? H1N1 08/08/2008  ? Influenza Split 06/03/2011, 04/15/2012  ? Influenza Whole 04/20/2008, 04/20/2009  ? Influenza, High Dose Seasonal PF 06/01/2013, 04/27/2018, 05/11/2019  ? Influenza,inj,Quad PF,6+ Mos 04/28/2014  ? Influenza-Unspecified 05/31/2016, 06/20/2017  ? Moderna Sars-Covid-2 Vaccination 10/20/2019, 11/17/2019, 06/28/2020, 02/13/2021  ? Pneumococcal Conjugate-13 12/21/2013  ? Pneumococcal Polysaccharide-23 01/19/2008, 07/29/2017  ? Td 08/08/2008  ? Zoster Recombinat (Shingrix) 02/13/2021  ? Zoster, Live 05/31/2014  ? ?Health Maintenance Due  ?Topic Date Due  ? COLONOSCOPY (Pts 45-80yr Insurance coverage will need to be confirmed)   09/08/2017  ? OPHTHALMOLOGY EXAM  10/22/2021  ? ?  ? ?Past Medical History:  ?Diagnosis Date  ? Adenoma 05/2008  ? [facility]   ? BPH (benign prostatic hyperplasia)   ? Cancer ([facility]   ? Prostate  ? Chronic LBP   ? Hip & Back -- [facility] [email or web address] ? Colon polyps   ? Complex renal cyst 12/2010  ? Controlled type 2 diabetes mellitus with neuropathy [place] 01/2007  ? COPD (chronic obstructive [place] disease) [place]   ? [place]   ? Diverticulitis of colon   ? ED (erectile dysfunction)   ? s/p Penile prosthesis (09/2010)  ? History of prostate cancer   ? Dr. [place]sten Ro ? History of sick sinus syndrome   ? reduced BB dose for Bradycardia  ? HLD (hyperlipidemia)   ? Hypertension   ? Impaired glucose tolerance 12/21/2013  ? Nephrolithiasis   ? Osteoarthritis of both hips   ? Osteoarthritis of lumbosacral spine   ? with Disc Disease  ? PAF (paroxysmal atrial fibrillation) ([place]   ? No longer on Amiodarone.  Not on Anticoaguation b/c no recurrence.  ? Paresthesias/numbness   ? Bilateral LE  ? Peripheral neuropathy 12/21/2013  ? ?Past Surgical History:  ?Procedure Laterality Date  ? LEFT HEART CATH AND CORONARY ANGIOGRAPHY  2003  ? Normal Coronary Arteries.  ? NM MYOVIEW LTD  02/21/2020  ? EF  60%.  Medium sized mild severity defect in the basal inferior, mid inferior and apical inferior location-suggesting of ischemia.  Read as low-intermediate risk.  (On cardiology review this is felt to be a fixed defect either related to prior infarct versus diaphragmatic attenuation.  Felt to be low risk)  ? PENILE PROSTHESIS IMPLANT  06/21/2012  ? Procedure: PENILE PROTHESIS INFLATABLE;  Surgeon: Claybon Jabs, MD;  Location: [facility];  Service: Urology;  Laterality: N/A;  REMOVAL AND REPLACEMENT OF SOME  OF PROSTHESIS ([facility])   ? PENILE PROSTHESIS PLACEMENT  09/2010  ? PROSTATECTOMY    ? REMOVAL OF PENILE PROSTHESIS N/A 02/15/2018  ? Procedure: REMOVAL OF PENILE PROSTHESIS;   Surgeon: Kathie Rhodes, MD;  [facility];  Service: Urology;  Laterality: N/A;  ? TRANSTHORACIC ECHOCARDIOGRAM  04/2016  ? a) Relatively normal EF of 55-60%. No RWMA suggesting no prior MI. Gr 2 DD (pseudo-normal filling pattern) along with moderately dilated left atrium.;; b) 02/2018: Mod LVH. EF 65-70%. No RWMA.  Unable to assess DF 2/2 Afib. Mild MR. Severe BiAtrial Enlargement. High CVP.  ? TRANSTHORACIC ECHOCARDIOGRAM  02/17/2020  ? EF 60 to 65%.  No R WMA.  Unable to assess diastolic parameters because of A. fib.  Mildly elevated PA pressures.  Mild LA dilation.  Mild aortic valve sclerosis but no stenosis.  Normal IVC.  ? ? reports that he quit smoking about 39 years ago. His smoking use included cigarettes. He has never used smokeless tobacco. He reports current alcohol use. He reports that he does not use drugs. ?family history includes Coronary artery disease in his father and mother; Diabetes in his father; Heart attack in his mother; Prostate cancer in his father. ?Allergies  ?Allergen Reactions  ? Ciprofloxacin Other (See Comments)  ?  All over weakness  ? ?Current Outpatient Medications on File Prior to Visit  ?Medication Sig Dispense Refill  ? acetaminophen (TYLENOL) 325 MG tablet Take 2 tablets (650 mg total) by mouth every 6 (six) hours as needed for mild pain or moderate pain.    ? amLODipine (NORVASC) 10 MG tablet TAKE 1 TABLET BY MOUTH  DAILY 90 tablet 3  ? apixaban (ELIQUIS) 5 MG TABS tablet TAKE 1 TABLET BY MOUTH  TWICE DAILY 180 tablet 2  ? furosemide (LASIX) 40 MG tablet TAKE 1 TABLET BY MOUTH  DAILY MAY TAKE AN  ADDITIONAL 1 TABLET IF  NEEDED FOR SWELLING 120 tablet 1  ? Lancet Devices (ONETOUCH DELICA PLUS LANCING) MISC     ? Lancets (ONETOUCH DELICA PLUS [id number]) MISC USE ONCE DAILY 100 each 3  ? loratadine (CLARITIN) 10 MG tablet Take 10 mg by mouth daily as needed for allergies.    ? polyethylene glycol (MIRALAX / GLYCOLAX) 17 g packet Take 17 g by mouth daily as needed for  moderate constipation. 7 each 0  ? potassium chloride (KLOR-CON) 10 MEQ tablet TAKE 1 TABLET BY MOUTH  DAILY 90 tablet 0  ? pravastatin (PRAVACHOL) 40 MG tablet TAKE 1 TABLET BY MOUTH DAILY 30 tablet 0  ? PROAIR HFA 108 (90 Base) MCG/ACT inhaler INHALE 2 PUFFS INTO THE  LUNGS EVERY 6 HOURS AS  NEEDED (Patient taking differently: Inhale 2 puffs into the lungs every 6 (six) hours as needed for shortness of breath or wheezing.) 34 g 3  ? ?No current facility-administered medications on file prior to visit.  ? ?     ROS:  All others reviewed and negative. ? ?Objective  ? ?  PE:  BP (!) 148/82 (BP Location: Right Arm, Patient Position: Sitting, Cuff Size: Large)   Pulse (!) 49   Temp 97.6 ?F (36.4 ?C) (Oral)   Ht '6\' 1"'$  (1.854 m)   Wt 274 lb (124.3 kg)   SpO2 100%   BMI 36.15 kg/m?  ? ?              Constitutional: Pt appears in NAD ?              HENT: Head: NCAT.  ?              Right Ear: External ear normal.   ?              Left Ear: External ear normal.  ?              Eyes: . Pupils are equal, round, and reactive to light. Conjunctivae and EOM are normal ?              Nose: without d/c or deformity ?              Neck: Neck supple. Gross normal ROM ?              Cardiovascular: Normal rate and regular rhythm.   ?              Pulmonary/Chest: Effort normal and breath sounds without rales or wheezing.  ?              Abd:  Soft, NT, ND, + BS, no organomegaly ?              Neurological: Pt is alert. At baseline orientation, motor grossly intact ?              Skin: Skin is warm. No rashes, no other new lesions, LE edema - chronic 2+ right > left  ?              Psychiatric: Pt behavior is normal without agitation  ? ?Micro: none ? ?Cardiac tracings I have personally interpreted today:  none ? ?Pertinent Radiological findings (summarize): none  ? ?Lab Results  ?Component Value Date  ? WBC 5.6 11/18/2021  ? HGB 13.7 11/18/2021  ? HCT 42.6 11/18/2021  ? PLT 214.0 11/18/2021  ? GLUCOSE 121 (H) 11/18/2021  ?  CHOL 144 11/18/2021  ? TRIG 64.0 11/18/2021  ? HDL 39.00 (L) 11/18/2021  ? [place] 93 11/18/2021  ? ALT 8 11/18/2021  ? AST 14 11/18/2021  ? NA 138 11/18/2021  ? K 4.3 11/18/2021  ? CL 105 11/18/2021  ? CREATININE 1.58 (H) 05/01

## 2021-11-18 NOTE — Patient Instructions (Signed)
Ok to decrease the metoprolol to HALF of the 25 mg twice per day ? ?Please continue all other medications as before, and refills have been done if requested. ? ?Please have the pharmacy call with any other refills you may need. ? ?Please continue your efforts at being more active, low cholesterol diet, and weight control. ? ?You are otherwise up to date with prevention measures today. ? ?Please keep your appointments with your specialists as you may have planned ? ?You will be contacted regarding the referral for: colonoscopy, eye doctor, podiatry, and outpatient Physical Therapy ? ?Please go to the LAB at the blood drawing area for the tests to be done ? ?You will be contacted by phone if any changes need to be made immediately.  Otherwise, you will receive a letter about your results with an explanation, but please check with MyChart first. ? ?Please remember to sign up for MyChart if you have not done so, as this will be important to you in the future with finding out test results, communicating by private email, and scheduling acute appointments online when needed. ? ?Please make an Appointment to return in 6 months, or sooner if needed ?

## 2021-11-21 ENCOUNTER — Ambulatory Visit: Payer: Medicare Other | Admitting: Physical Therapy

## 2021-11-21 ENCOUNTER — Encounter: Payer: Self-pay | Admitting: Internal Medicine

## 2021-11-21 DIAGNOSIS — R001 Bradycardia, unspecified: Secondary | ICD-10-CM | POA: Insufficient documentation

## 2021-11-21 DIAGNOSIS — E559 Vitamin D deficiency, unspecified: Secondary | ICD-10-CM | POA: Insufficient documentation

## 2021-11-21 NOTE — Assessment & Plan Note (Signed)
Pt to continue pravachol, low chol diet, excericse ?

## 2021-11-21 NOTE — Assessment & Plan Note (Signed)
Age and sex appropriate education and counseling updated with regular exercise and diet ?Referrals for preventative services - for eye referral, and colonoscopy ?Immunizations addressed - none needed ?Smoking counseling  - none needed ?Evidence for depression or other mood disorder - none significant ?Most recent labs reviewed. ?I have personally reviewed and have noted: ?1) the patient's medical and social history ?2) The patient's current medications and supplements ?3) The patient's height, weight, and BMI have been recorded in the chart ? ?

## 2021-11-21 NOTE — Assessment & Plan Note (Signed)
Lab Results  ?Component Value Date  ? HGBA1C 6.6 (H) 11/18/2021  ? ?Stable, pt to continue current medical treatment  - diet, wt control ? ?

## 2021-11-21 NOTE — Assessment & Plan Note (Signed)
Ok for outpt PT eval ?

## 2021-11-21 NOTE — Assessment & Plan Note (Signed)
Pt also asks for testosterone level, to f/u any worsening symptoms or concerns ?

## 2021-11-21 NOTE — Assessment & Plan Note (Addendum)
overcontrolled due to BB, Pt to reduce the metoprolol to 12.5 bid ?

## 2021-11-21 NOTE — Assessment & Plan Note (Signed)
C/w chronic venous insufficiency and diast CHF, for compression stocking, leg elevated, low salt and lasix  ?

## 2021-11-21 NOTE — Assessment & Plan Note (Signed)
Lab Results  ?Component Value Date  ? [place] 93 11/18/2021  ? ?Uncontrolled, goal ldl < 70, pt to continue current statin pravachol 40 as declines change, to work on lower chol, DM diet ? ?

## 2021-11-21 NOTE — Assessment & Plan Note (Signed)
Last vitamin D ?Lab Results  ?Component Value Date  ? VD25OH 10.10 (L) 11/18/2021  ? ?Low, to start oral replacement ? ?

## 2021-11-21 NOTE — Assessment & Plan Note (Signed)
BP Readings from Last 3 Encounters:  ?11/18/21 (!) 148/82  ?07/08/21 136/80  ?12/12/20 (!) 148/74  ? ?unocntolled here, pt state < 140/90 at home,, pt to continue medical treatment norvasc lopressor ? ?

## 2021-11-21 NOTE — Assessment & Plan Note (Signed)
Lab Results  ?Component Value Date  ? CREATININE 1.58 (H) 11/18/2021  ? ?Stable overall, cont to avoid nephrotoxins ? ?

## 2021-11-22 ENCOUNTER — Ambulatory Visit: Payer: Medicare Other

## 2021-11-29 ENCOUNTER — Ambulatory Visit: Payer: Medicare Other | Admitting: Physical Therapy

## 2021-11-29 NOTE — Therapy (Incomplete)
?OUTPATIENT PHYSICAL THERAPY NEURO EVALUATION ? ? ?Patient Name: [name] ?MRN: [id number] ?DOB:[birth year], 76 y.o., male ?Today's Date: 11/29/2021 ? ?PCP: Leonie Man, MD, Biagio Borg, MD ?REFERRING PROVIDER: Biagio Borg, MD  ? ? ? ?Past Medical History:  ?Diagnosis Date  ? Adenoma 05/2008  ? Alcoholism in recovery [facility])   ? BPH (benign prostatic hyperplasia)   ? Cancer [facility])   ? Prostate  ? Chronic LBP   ? Hip & Back -- Sees Dr. Maia Petties @ [place]  ? Colon polyps   ? Complex renal cyst 12/2010  ? Controlled type 2 diabetes mellitus with neuropathy ([place]) 01/2007  ? COPD (chronic obstructive pulmonary disease) ([place])   ? Diabetes ([place])   ? Diverticulitis of colon   ? ED (erectile dysfunction)   ? s/p Penile prosthesis (09/2010)  ? History of prostate cancer   ? Dr. Karsten Ro  ? History of sick sinus syndrome   ? reduced BB dose for Bradycardia  ? HLD (hyperlipidemia)   ? Hypertension   ? Impaired glucose tolerance 12/21/2013  ? Nephrolithiasis   ? Osteoarthritis of both hips   ? Osteoarthritis of lumbosacral spine   ? with Disc Disease  ? PAF (paroxysmal atrial fibrillation) ([place])   ? No longer on Amiodarone.  Not on Anticoaguation b/c no recurrence.  ? Paresthesias/numbness   ? Bilateral LE  ? Peripheral neuropathy 12/21/2013  ? ?Past Surgical History:  ?Procedure Laterality Date  ? LEFT HEART CATH AND CORONARY ANGIOGRAPHY  2003  ? Normal Coronary Arteries.  ? NM MYOVIEW LTD  02/21/2020  ? EF 60%.  Medium sized mild severity defect in the basal inferior, mid inferior and apical inferior location-suggesting of ischemia.  Read as low-intermediate risk.  (On cardiology review this is felt to be a fixed defect either related to prior infarct versus diaphragmatic attenuation.  Felt to be low risk)  ? PENILE PROSTHESIS IMPLANT  06/21/2012  ? Procedure: PENILE PROTHESIS INFLATABLE;  Surgeon: Claybon Jabs, MD;  Location: [facility];  Service: Urology;  Laterality: N/A;  REMOVAL AND  REPLACEMENT OF SOME  OF PROSTHESIS (AMS)   ? PENILE PROSTHESIS PLACEMENT  09/2010  ? PROSTATECTOMY    ? REMOVAL OF PENILE PROSTHESIS N/A 02/15/2018  ? Procedure: REMOVAL OF PENILE PROSTHESIS;  Surgeon: Kathie Rhodes, MD;  [facility];  Service: Urology;  Laterality: N/A;  ? TRANSTHORACIC ECHOCARDIOGRAM  04/2016  ? a) Relatively normal EF of 55-60%. No RWMA suggesting no prior MI. Gr 2 DD (pseudo-normal filling pattern) along with moderately dilated left atrium.;; b) 02/2018: Mod LVH. EF 65-70%. No RWMA.  Unable to assess DF 2/2 Afib. Mild MR. Severe BiAtrial Enlargement. High CVP.  ? TRANSTHORACIC ECHOCARDIOGRAM  02/17/2020  ? EF 60 to 65%.  No R WMA.  Unable to assess diastolic parameters because of A. fib.  Mildly elevated PA pressures.  Mild LA dilation.  Mild aortic valve sclerosis but no stenosis.  Normal IVC.  ? ?Patient Active Problem List  ? Diagnosis Date Noted  ? Bradycardia 11/21/2021  ? Vitamin D deficiency 11/21/2021  ? Other fatigue 11/18/2021  ? Bilateral leg edema 07/08/2021  ? Gait disorder 12/16/2020  ? HTN (hypertension) 12/16/2020  ? Aortic atherosclerosis ([place]) 12/12/2020  ? Diverticulitis 10/23/2020  ? Fall 10/22/2020  ? Sepsis ([place]) 10/22/2020  ? Acute lower UTI 10/22/2020  ? NSVT (nonsustained ventricular tachycardia) ([place])   ? Multiple myeloma ([place])   ? AKI (acute kidney injury) ([place])  02/15/2020  ? General weakness   ? Diabetic leg ulcer ([place]) 10/06/2019  ? CKD (chronic kidney disease) stage 3, GFR 30-59 ml/min (HCC) 10/06/2019  ? Diabetic ulcer of right lower leg ([place]) 02/23/2019  ? Abnormal LFTs 04/27/2018  ? Open wound of scrotum 03/01/2018  ? Acute on chronic diastolic CHF (congestive heart failure) ([place]) 02/25/2018  ? Postoperative anemia due to acute blood loss 02/25/2018  ? Occipital stroke ([place]), left 09/23/2017  ? Diabetes mellitus type II, non insulin dependent ([place])   ? Encounter for well adult exam with abnormal findings 07/31/2017  ? Venous insufficiency (chronic) (peripheral)  with chronic lower extremity edema 04/04/2016  ? Junctional tachycardia ([place]) 03/18/2015  ? Exertional dyspnea 07/05/2014  ? Obesity (BMI 30-39.9) 05/04/2014  ? Benign essential tremor 05/04/2014  ? Peripheral neuropathy 12/21/2013  ? Nonspecific abnormal finding in stool contents 09/06/2012  ? Heme positive stool 08/09/2012  ? Difficulty passing stool 08/09/2012  ? Sebaceous cyst 04/15/2012  ? Complex renal cyst 01/13/2011  ? Back pain 01/02/2011  ? Permanent atrial fibrillation (HCC) - now off amiodarone; asymptomatic. CHA2DS2-VASc Score -5 (on Xarelto) 01/02/2011  ? KNEE PAIN, LEFT 08/27/2009  ? COLONIC POLYPS, HX OF 08/08/2008  ? ERECTILE DYSFUNCTION 09/27/2007  ? ULNAR NEUROPATHY 09/27/2007  ? VENTRICULAR HYPERTROPHY, LEFT 09/27/2007  ? ALLERGIC RHINITIS 09/27/2007  ? Arthropathy 09/27/2007  ? GANGLION CYST, WRIST, LEFT 09/27/2007  ? HEMORRHOIDS, INTERNAL 06/10/2007  ? DIVERTICULOSIS, COLON 06/10/2007  ? [place] DISEASE, LUMBAR 06/03/2007  ? NEPHROLITHIASIS, HX OF 06/03/2007  ? Diabetes mellitus with neuropathy ([place]) 02/11/2007  ? Hyperlipidemia with target LDL less than 100 02/11/2007  ? ANXIETY 02/11/2007  ? Nondependent Alcohol Abuse, in Remission 02/11/2007  ? Hypertensive heart disease with chronic diastolic congestive heart failure ([place]) 02/11/2007  ? COPD (chronic obstructive pulmonary disease) ([place]) 02/11/2007  ? BENIGN PROSTATIC HYPERTROPHY 02/11/2007  ? LOW BACK PAIN 02/11/2007  ? Disturbance of skin sensation 02/11/2007  ? PROSTATE CANCER, HX OF 02/11/2007  ? ? ?ONSET DATE: 11/18/2021 (referral) ? ?REFERRING DIAG: R26.9 (ICD-10-CM) - Gait disorder  ? ?THERAPY DIAG:  ?No diagnosis found. ? ?SUBJECTIVE:  ?                                                                                                                                                                                           ? ?SUBJECTIVE STATEMENT: ?*** ?Pt accompanied by: {accompnied:27141} ? ?PERTINENT HISTORY: Adenoma (05/2008),  Alcoholism in recovery [facility]), BPH (benign prostatic hyperplasia), Cancer ([place]), Chronic LBP, Colon polyps, Complex renal cyst (12/2010), Controlled type 2 diabetes mellitus with neuropathy ([place]) (01/2007), COPD (chronic obstructive pulmonary disease) ([place]), Diabetes (  [place]), Diverticulitis of colon, ED (erectile dysfunction), History of prostate cancer, History of sick sinus syndrome, HLD (hyperlipidemia), Hypertension, Impaired glucose tolerance (12/21/2013), Nephrolithiasis, Osteoarthritis of both hips, Osteoarthritis of lumbosacral spine, PAF (paroxysmal atrial fibrillation) ([place]), Paresthesias/numbness, and Peripheral neuropathy (12/21/2013).  ? ?PAIN:  ?Are you having pain? {OPRCPAIN:27236} ? ?PRECAUTIONS: {Therapy precautions:24002} ? ?WEIGHT BEARING RESTRICTIONS {Yes ***/No:24003} ? ?FALLS: Has patient fallen in last 6 months? {fallsyesno:27318} ? ?LIVING ENVIRONMENT: ?Lives with: {OPRC lives with:25569::"lives with their family"} ?Lives in: {Lives in:25570} ?Stairs: {opstairs:27293} ?Has following equipment at home: {Assistive devices:23999} ? ?PLOF: {PLOF:24004} ? ?PATIENT GOALS *** ? ?OBJECTIVE:  ? ?DIAGNOSTIC FINDINGS: *** ? ?COGNITION: ?Overall cognitive status: {cognition:24006} ?  ?SENSATION: ?{sensation:27233} ? ?COORDINATION: ?*** ? ?EDEMA:  ?{edema:24020} ? ?MUSCLE TONE: {LE tone:25568} ? ? ?MUSCLE LENGTH: ?Hamstrings: Right *** deg; Left *** deg ?Thomas test: Right *** deg; Left *** deg ? ?DTRs:  ?{DTR SITE:24025} ? ?POSTURE: {posture:25561} ? ?LE ROM:    ? ?{AROM/PROM:27142}  Right ?11/29/2021 Left ?11/29/2021  ?Hip flexion    ?Hip extension    ?Hip abduction    ?Hip adduction    ?Hip internal rotation    ?Hip external rotation    ?Knee flexion    ?Knee extension    ?Ankle dorsiflexion    ?Ankle plantarflexion    ?Ankle inversion    ?Ankle eversion    ? (Blank rows = not tested) ? ?MMT:   ? ?MMT Right ?11/29/2021 Left ?11/29/2021  ?Hip flexion    ?Hip extension    ?Hip abduction    ?Hip adduction    ?Hip  internal rotation    ?Hip external rotation    ?Knee flexion    ?Knee extension    ?Ankle dorsiflexion    ?Ankle plantarflexion    ?Ankle inversion    ?Ankle eversion    ?(Blank rows = not tested) ? ?BED MOBILI

## 2021-11-29 NOTE — Progress Notes (Incomplete)
Patient Name: [name] ?MRN: [id number] ?DOB:1946/04/15, 76 y.o., male ?Today's Date: 11/29/2021 ? ?Testing,.....testing...Marland KitchenMarland Kitchentesting..... ? ? ?Kerrie Pleasure, PT ?11/29/2021, 4:00 PM ? ?

## 2021-12-02 ENCOUNTER — Ambulatory Visit: Payer: Medicare Other | Attending: Internal Medicine | Admitting: Physical Therapy

## 2021-12-02 DIAGNOSIS — M6281 Muscle weakness (generalized): Secondary | ICD-10-CM | POA: Insufficient documentation

## 2021-12-02 DIAGNOSIS — R2681 Unsteadiness on feet: Secondary | ICD-10-CM | POA: Insufficient documentation

## 2021-12-02 DIAGNOSIS — R269 Unspecified abnormalities of gait and mobility: Secondary | ICD-10-CM | POA: Diagnosis not present

## 2021-12-02 DIAGNOSIS — R293 Abnormal posture: Secondary | ICD-10-CM | POA: Insufficient documentation

## 2021-12-02 NOTE — Therapy (Signed)
?OUTPATIENT PHYSICAL THERAPY NEURO EVALUATION ? ? ?Patient Name: [name] ?MRN: [id number] ?DOB:January 05, 1946, 76 y.o., male ?Today's Date: 12/02/2021 ? ?PCP: Leonie Man, MD, Biagio Borg, MD ?REFERRING PROVIDER: Biagio Borg, MD  ? ? PT End of Session - 12/02/21 1022   ? ? Visit Number 1   ? Number of Visits 17   plus eval  ? Date for PT Re-Evaluation 02/24/22   ? Authorization Type UHC mediciare   ? Progress Note Due on Visit 10   ? PT Start Time 1019   ? PT Stop Time 1101   ? PT Time Calculation (min) 42 min   ? Equipment Utilized During Treatment Gait belt   ? Activity Tolerance Patient limited by fatigue   COPD  ? Behavior During Therapy [facility] for tasks assessed/performed   ? ?  ?  ? ?  ? ? ?Past Medical History:  ?Diagnosis Date  ? Adenoma 05/2008  ? Alcoholism in recovery [facility])   ? BPH (benign prostatic hyperplasia)   ? Cancer [facility])   ? Prostate  ? Chronic LBP   ? Hip & Back -- Sees Dr. Maia Petties @ [place]  ? Colon polyps   ? Complex renal cyst 12/2010  ? Controlled type 2 diabetes mellitus with neuropathy ([place]) 01/2007  ? COPD (chronic obstructive pulmonary disease) ([place])   ? Diabetes ([place])   ? Diverticulitis of colon   ? ED (erectile dysfunction)   ? s/p Penile prosthesis (09/2010)  ? History of prostate cancer   ? Dr. Karsten Ro  ? History of sick sinus syndrome   ? reduced BB dose for Bradycardia  ? HLD (hyperlipidemia)   ? Hypertension   ? Impaired glucose tolerance 12/21/2013  ? Nephrolithiasis   ? Osteoarthritis of both hips   ? Osteoarthritis of lumbosacral spine   ? with Disc Disease  ? PAF (paroxysmal atrial fibrillation) ([place])   ? No longer on Amiodarone.  Not on Anticoaguation b/c no recurrence.  ? Paresthesias/numbness   ? Bilateral LE  ? Peripheral neuropathy 12/21/2013  ? ?Past Surgical History:  ?Procedure Laterality Date  ? LEFT HEART CATH AND CORONARY ANGIOGRAPHY  2003  ? Normal Coronary Arteries.  ? NM MYOVIEW LTD  02/21/2020  ? EF 60%.  Medium sized mild severity defect in  the basal inferior, mid inferior and apical inferior location-suggesting of ischemia.  Read as low-intermediate risk.  (On cardiology review this is felt to be a fixed defect either related to prior infarct versus diaphragmatic attenuation.  Felt to be low risk)  ? PENILE PROSTHESIS IMPLANT  06/21/2012  ? Procedure: PENILE PROTHESIS INFLATABLE;  Surgeon: Claybon Jabs, MD;  Location[facility];  Service: Urology;  Laterality: N/A;  REMOVAL AND REPLACEMENT OF SOME  OF PROSTHESIS (AMS)   ? PENILE PROSTHESIS PLACEMENT  09/2010  ? PROSTATECTOMY    ? REMOVAL OF PENILE PROSTHESIS N/A 02/15/2018  ? Procedure: REMOVAL OF PENILE PROSTHESIS;  Surgeon: Kathie Rhodes, MD;  [facility];  Service: Urology;  Laterality: N/A;  ? TRANSTHORACIC ECHOCARDIOGRAM  04/2016  ? a) Relatively normal EF of 55-60%. No RWMA suggesting no prior MI. Gr 2 DD (pseudo-normal filling pattern) along with moderately dilated left atrium.;; b) 02/2018: Mod LVH. EF 65-70%. No RWMA.  Unable to assess DF 2/2 Afib. Mild MR. Severe BiAtrial Enlargement. High CVP.  ? TRANSTHORACIC ECHOCARDIOGRAM  02/17/2020  ? EF 60 to 65%.  No R WMA.  Unable to assess diastolic parameters  because of A. fib.  Mildly elevated PA pressures.  Mild LA dilation.  Mild aortic valve sclerosis but no stenosis.  Normal IVC.  ? ?Patient Active Problem List  ? Diagnosis Date Noted  ? Bradycardia 11/21/2021  ? Vitamin D deficiency 11/21/2021  ? Other fatigue 11/18/2021  ? Bilateral leg edema 07/08/2021  ? Gait disorder 12/16/2020  ? HTN (hypertension) 12/16/2020  ? Aortic atherosclerosis ([place]) 12/12/2020  ? Diverticulitis 10/23/2020  ? Fall 10/22/2020  ? Sepsis ([place]) 10/22/2020  ? Acute lower UTI 10/22/2020  ? NSVT (nonsustained ventricular tachycardia) ([place])   ? Multiple myeloma ([place])   ? AKI (acute kidney injury) ([place]) 02/15/2020  ? General weakness   ? Diabetic leg ulcer ([place]) 10/06/2019  ? CKD (chronic kidney disease) stage 3, GFR 30-59 ml/min (HCC) 10/06/2019  ?  Diabetic ulcer of right lower leg ([place]) 02/23/2019  ? Abnormal LFTs 04/27/2018  ? Open wound of scrotum 03/01/2018  ? Acute on chronic diastolic CHF (congestive heart failure) ([place]) 02/25/2018  ? Postoperative anemia due to acute blood loss 02/25/2018  ? Occipital stroke ([place]), left 09/23/2017  ? Diabetes mellitus type II, non insulin dependent ([place])   ? Encounter for well adult exam with abnormal findings 07/31/2017  ? Venous insufficiency (chronic) (peripheral) with chronic lower extremity edema 04/04/2016  ? Junctional tachycardia ([place]) 03/18/2015  ? Exertional dyspnea 07/05/2014  ? Obesity (BMI 30-39.9) 05/04/2014  ? Benign essential tremor 05/04/2014  ? Peripheral neuropathy 12/21/2013  ? Nonspecific abnormal finding in stool contents 09/06/2012  ? Heme positive stool 08/09/2012  ? Difficulty passing stool 08/09/2012  ? Sebaceous cyst 04/15/2012  ? Complex renal cyst 01/13/2011  ? Back pain 01/02/2011  ? Permanent atrial fibrillation (HCC) - now off amiodarone; asymptomatic. CHA2DS2-VASc Score -5 (on Xarelto) 01/02/2011  ? KNEE PAIN, LEFT 08/27/2009  ? COLONIC POLYPS, HX OF 08/08/2008  ? ERECTILE DYSFUNCTION 09/27/2007  ? ULNAR NEUROPATHY 09/27/2007  ? VENTRICULAR HYPERTROPHY, LEFT 09/27/2007  ? ALLERGIC RHINITIS 09/27/2007  ? Arthropathy 09/27/2007  ? GANGLION CYST, WRIST, LEFT 09/27/2007  ? HEMORRHOIDS, INTERNAL 06/10/2007  ? DIVERTICULOSIS, COLON 06/10/2007  ? [place] DISEASE, LUMBAR 06/03/2007  ? NEPHROLITHIASIS, HX OF 06/03/2007  ? Diabetes mellitus with neuropathy ([place]) 02/11/2007  ? Hyperlipidemia with target LDL less than 100 02/11/2007  ? ANXIETY 02/11/2007  ? Nondependent Alcohol Abuse, in Remission 02/11/2007  ? Hypertensive heart disease with chronic diastolic congestive heart failure ([place]) 02/11/2007  ? COPD (chronic obstructive pulmonary disease) ([place]) 02/11/2007  ? BENIGN PROSTATIC HYPERTROPHY 02/11/2007  ? LOW BACK PAIN 02/11/2007  ? Disturbance of skin sensation 02/11/2007  ? PROSTATE CANCER, HX  OF 02/11/2007  ? ? ?ONSET DATE: 11/18/2021 (referral) ? ?REFERRING DIAG: R26.9 (ICD-10-CM) - Gait disorder  ? ?THERAPY DIAG:  ?Abnormal posture ? ?Unsteadiness on feet ? ?Muscle weakness (generalized) ? ?SUBJECTIVE:  ?                                                                                                                                                                                           ? ?  SUBJECTIVE STATEMENT: ?"I do not want to be here but I know I need to be". "I cannot move around by myself and that is frustrating". Pt's daughter reports pt has been sedentary since April 2022. Daughter states pt received rehab following hospital stay in April last year and then HHPT/HHOT but they quickly graduated him. Pt is currently sleeping in lift chair and sits there majority of day, walks w/RW to bathroom (very short distance) and that is it. Daughter hopes to get him up and moving. Pt hopes to be able to drive so he can "go where he wants" ? ?Pt accompanied by:  Daughter ? ?PERTINENT HISTORY: Adenoma (05/2008), Alcoholism in recovery [facility]), BPH (benign prostatic hyperplasia), Cancer ([place]), Chronic LBP, Colon polyps, Complex renal cyst (12/2010), Controlled type 2 diabetes mellitus with neuropathy ([place]) (01/2007), COPD (chronic obstructive pulmonary disease) ([place]), Diabetes ([place]), Diverticulitis of colon, ED (erectile dysfunction), History of prostate cancer, History of sick sinus syndrome, HLD (hyperlipidemia), Hypertension, Impaired glucose tolerance (12/21/2013), Nephrolithiasis, Osteoarthritis of both hips, Osteoarthritis of lumbosacral spine, PAF (paroxysmal atrial fibrillation) ([place]), Paresthesias/numbness, and Peripheral neuropathy (12/21/2013).  ? ?PAIN:  ?Are you having pain? No ? ?PRECAUTIONS: Fall ? ?VITALS: All BP readings taken in LUE while seated  ?-Pre-session: 157/72 mmHg, HR 65 BPM  ? ?WEIGHT BEARING RESTRICTIONS No ? ?FALLS: Has patient fallen in last 6 months? No ? ?LIVING ENVIRONMENT: ?Lives  with: lives with their daughter ?Lives in: House/apartment ?Stairs: Yes: External: 3 steps; on left going up ?Has following equipment at home: Gilford Rile - 2 wheeled ? ?PLOF: Requires assistive device for in

## 2021-12-04 ENCOUNTER — Telehealth: Payer: Self-pay

## 2021-12-04 MED ORDER — POTASSIUM CHLORIDE ER 10 MEQ PO TBCR
10.0000 meq | EXTENDED_RELEASE_TABLET | Freq: Every day | ORAL | 0 refills | Status: DC
Start: 2021-12-04 — End: 2022-03-10

## 2021-12-04 NOTE — Telephone Encounter (Signed)
Prescription sent to pharmacy and patient notified.  

## 2021-12-04 NOTE — Telephone Encounter (Signed)
Pt daugther is requesting a short supply for 7 days of potassium chloride (KLOR-CON) 10 MEQ tablet. Mail order has been delayed ? ?Pharmacy: ?[facility] ([place]), [place] - [address] ? ? ?

## 2021-12-05 ENCOUNTER — Ambulatory Visit: Payer: Medicare Other | Admitting: Podiatry

## 2021-12-08 ENCOUNTER — Other Ambulatory Visit: Payer: Self-pay | Admitting: Cardiology

## 2021-12-09 ENCOUNTER — Ambulatory Visit: Payer: Medicare Other | Admitting: Physical Therapy

## 2021-12-10 ENCOUNTER — Ambulatory Visit: Payer: Medicare Other | Admitting: Podiatry

## 2021-12-10 DIAGNOSIS — I480 Paroxysmal atrial fibrillation: Secondary | ICD-10-CM | POA: Diagnosis not present

## 2021-12-10 DIAGNOSIS — M1990 Unspecified osteoarthritis, unspecified site: Secondary | ICD-10-CM | POA: Diagnosis not present

## 2021-12-10 DIAGNOSIS — N183 Chronic kidney disease, stage 3 unspecified: Secondary | ICD-10-CM | POA: Diagnosis not present

## 2021-12-10 DIAGNOSIS — M179 Osteoarthritis of knee, unspecified: Secondary | ICD-10-CM | POA: Diagnosis not present

## 2021-12-12 ENCOUNTER — Ambulatory Visit: Payer: Medicare Other | Admitting: Physical Therapy

## 2021-12-19 ENCOUNTER — Ambulatory Visit: Payer: Medicare Other | Admitting: Physical Therapy

## 2021-12-23 ENCOUNTER — Encounter: Payer: Self-pay | Admitting: Physical Therapy

## 2021-12-23 ENCOUNTER — Ambulatory Visit: Payer: Medicare Other | Attending: Internal Medicine | Admitting: Physical Therapy

## 2021-12-23 NOTE — Therapy (Signed)
[facility] [address] [place], [place], [address] Phone: [phone]   Fax:  [phone]  Patient Details  Name: [name] MRN: [id number] Date of Birth: 1945/12/21 Referring Provider:  Biagio Borg, MD   Encounter Date: 12/23/2021  PHYSICAL THERAPY DISCHARGE SUMMARY  Visits from Start of Care: 1  Current functional level related to goals / functional outcomes: See evaluation   Remaining deficits: Decreased cardiovascular endurance, decreased strength, decreased mobility, fall risk    Education / Equipment: None provided due to pt not participating in therapy    Patient is being discharged due to not returning to therapy since initial evaluation. Patient goals were not met.  GOALS: NONE MET DUE TO PT NOT PARTICIPATING Goals reviewed with patient? Yes   SHORT TERM GOALS: Target date: 12/30/2021   (Remove Blue Hyperlink)   Pt will be independent with initial HEP for improved strength, balance, transfers and gait.   Baseline: not established on eval Goal status: INITIAL   2.  Gait velocity to be established w/LRAD and STG/LTG written  Baseline:  Goal status: INITIAL   3.  Pt will improve normal TUG to less than or equal to 25 seconds w/LRAD for improved functional mobility and decreased fall risk.   Baseline: 31.09s w/RW and min A Goal status: INITIAL   4.  Pt will ascend/descend four 6" steps using L rail and step-to pattern w/min A for imitation of home environment and improved functional mobility  Baseline: Per daughter on eval, it is very difficult for pt to ascend/descend steps at home. Will further assess next visit  Goal status: INITIAL   5.  2MWT to be performed and LTG written  Baseline:  Goal status: INITIAL     LONG TERM GOALS: Target date: 01/27/2022   (Remove Blue Hyperlink)   Pt will be independent with final HEP for improved strength, balance, transfers and gait.   Baseline:   Goal status: INITIAL   2.  2MWT goal  Baseline:  Goal status: INITIAL   3.  Gait speed goal  Baseline:  Goal status: INITIAL   4.  Pt will ascend/descend four 6" steps using L handrail and S* for improved independence and safety at home.  Baseline:  Goal status: INITIAL   5.  Pt will achieve >/= 6 repetitions during 30s STS w/use of BUEs and proper body mechanics for improved transfers and global strength  Baseline: 3 reps w/BUE support on 5/15  Goal status: INITIAL   6.  Pt will improve normal TUG to less than or equal to 20 seconds w/LRAD for improved functional mobility and decreased fall risk.   Baseline: 31.09s w/RW and min A Goal status: INITIAL     Cruzita Lederer Roxine Whittinghill, PT, DPT 12/23/2021, 12:49 PM  [facility] [address] [place], [place], [address] Phone: [phone]   Fax:  [phone]

## 2021-12-24 ENCOUNTER — Ambulatory Visit: Payer: Medicare Other | Admitting: Podiatry

## 2021-12-26 ENCOUNTER — Ambulatory Visit: Payer: Medicare Other | Admitting: Physical Therapy

## 2021-12-29 ENCOUNTER — Encounter: Payer: Self-pay | Admitting: Internal Medicine

## 2021-12-29 DIAGNOSIS — E1140 Type 2 diabetes mellitus with diabetic neuropathy, unspecified: Secondary | ICD-10-CM

## 2021-12-29 DIAGNOSIS — I10 Essential (primary) hypertension: Secondary | ICD-10-CM

## 2021-12-29 DIAGNOSIS — R269 Unspecified abnormalities of gait and mobility: Secondary | ICD-10-CM

## 2021-12-29 DIAGNOSIS — S81809A Unspecified open wound, unspecified lower leg, initial encounter: Secondary | ICD-10-CM

## 2021-12-30 ENCOUNTER — Ambulatory Visit: Payer: Medicare Other | Admitting: Physical Therapy

## 2022-01-02 ENCOUNTER — Ambulatory Visit: Payer: Medicare Other | Admitting: Physical Therapy

## 2022-01-06 ENCOUNTER — Ambulatory Visit: Payer: Medicare Other | Admitting: Physical Therapy

## 2022-01-09 ENCOUNTER — Ambulatory Visit: Payer: Medicare Other | Admitting: Physical Therapy

## 2022-01-10 ENCOUNTER — Telehealth: Payer: Self-pay | Admitting: Internal Medicine

## 2022-01-10 NOTE — Telephone Encounter (Signed)
[name] stated pt has 3 open wounds on legs,weak,requesting HH, and therapy.Stated care this has been going on for several weeks  Please Advise

## 2022-01-10 NOTE — Telephone Encounter (Signed)
Per 6/11 chart note, HH referral has been placed.   Called pt and asked if he would like to be seen in regards to the wounds. Pt states he will have his daughter call us back.

## 2022-01-13 ENCOUNTER — Ambulatory Visit: Payer: Medicare Other | Admitting: Physical Therapy

## 2022-01-13 DIAGNOSIS — N183 Chronic kidney disease, stage 3 unspecified: Secondary | ICD-10-CM | POA: Diagnosis not present

## 2022-01-13 DIAGNOSIS — M1990 Unspecified osteoarthritis, unspecified site: Secondary | ICD-10-CM | POA: Diagnosis not present

## 2022-01-13 DIAGNOSIS — M179 Osteoarthritis of knee, unspecified: Secondary | ICD-10-CM | POA: Diagnosis not present

## 2022-01-13 DIAGNOSIS — I480 Paroxysmal atrial fibrillation: Secondary | ICD-10-CM | POA: Diagnosis not present

## 2022-01-14 ENCOUNTER — Ambulatory Visit: Payer: Medicare Other | Admitting: Family Medicine

## 2022-01-16 ENCOUNTER — Ambulatory Visit: Payer: Medicare Other | Admitting: Internal Medicine

## 2022-01-16 ENCOUNTER — Ambulatory Visit: Payer: Medicare Other | Admitting: Physical Therapy

## 2022-01-17 ENCOUNTER — Ambulatory Visit: Payer: Medicare Other | Admitting: Internal Medicine

## 2022-01-18 HISTORY — PX: TRANSTHORACIC ECHOCARDIOGRAM: SHX275

## 2022-01-19 ENCOUNTER — Other Ambulatory Visit: Payer: Self-pay | Admitting: Internal Medicine

## 2022-01-20 ENCOUNTER — Ambulatory Visit: Payer: Medicare Other | Admitting: Physical Therapy

## 2022-01-23 ENCOUNTER — Ambulatory Visit: Payer: Medicare Other | Admitting: Physical Therapy

## 2022-01-24 ENCOUNTER — Emergency Department (HOSPITAL_COMMUNITY): Payer: Medicare Other

## 2022-01-24 ENCOUNTER — Other Ambulatory Visit: Payer: Self-pay

## 2022-01-24 ENCOUNTER — Inpatient Hospital Stay (HOSPITAL_COMMUNITY)
Admission: EM | Admit: 2022-01-24 | Discharge: 2022-01-30 | DRG: 871 | Disposition: A | Discharge status: Skilled Nursing Facility | Payer: Medicare Other | Attending: Family Medicine | Admitting: Family Medicine

## 2022-01-24 ENCOUNTER — Telehealth: Payer: Self-pay | Admitting: Internal Medicine

## 2022-01-24 ENCOUNTER — Encounter (HOSPITAL_COMMUNITY): Payer: Self-pay | Admitting: Internal Medicine

## 2022-01-24 DIAGNOSIS — I110 Hypertensive heart disease with heart failure: Secondary | ICD-10-CM | POA: Diagnosis present

## 2022-01-24 DIAGNOSIS — I472 Ventricular tachycardia, unspecified: Secondary | ICD-10-CM | POA: Diagnosis not present

## 2022-01-24 DIAGNOSIS — G9341 Metabolic encephalopathy: Secondary | ICD-10-CM | POA: Diagnosis not present

## 2022-01-24 DIAGNOSIS — E785 Hyperlipidemia, unspecified: Secondary | ICD-10-CM | POA: Diagnosis not present

## 2022-01-24 DIAGNOSIS — R414 Neurologic neglect syndrome: Secondary | ICD-10-CM | POA: Diagnosis present

## 2022-01-24 DIAGNOSIS — B351 Tinea unguium: Secondary | ICD-10-CM | POA: Diagnosis not present

## 2022-01-24 DIAGNOSIS — R6520 Severe sepsis without septic shock: Secondary | ICD-10-CM | POA: Diagnosis present

## 2022-01-24 DIAGNOSIS — E559 Vitamin D deficiency, unspecified: Secondary | ICD-10-CM | POA: Diagnosis not present

## 2022-01-24 DIAGNOSIS — L89152 Pressure ulcer of sacral region, stage 2: Secondary | ICD-10-CM | POA: Diagnosis not present

## 2022-01-24 DIAGNOSIS — R509 Fever, unspecified: Secondary | ICD-10-CM | POA: Diagnosis not present

## 2022-01-24 DIAGNOSIS — I890 Lymphedema, not elsewhere classified: Secondary | ICD-10-CM | POA: Diagnosis present

## 2022-01-24 DIAGNOSIS — I130 Hypertensive heart and chronic kidney disease with heart failure and stage 1 through stage 4 chronic kidney disease, or unspecified chronic kidney disease: Secondary | ICD-10-CM | POA: Diagnosis not present

## 2022-01-24 DIAGNOSIS — Z8042 Family history of malignant neoplasm of prostate: Secondary | ICD-10-CM

## 2022-01-24 DIAGNOSIS — Z833 Family history of diabetes mellitus: Secondary | ICD-10-CM

## 2022-01-24 DIAGNOSIS — Z8673 Personal history of transient ischemic attack (TIA), and cerebral infarction without residual deficits: Secondary | ICD-10-CM | POA: Diagnosis not present

## 2022-01-24 DIAGNOSIS — Z743 Need for continuous supervision: Secondary | ICD-10-CM | POA: Diagnosis not present

## 2022-01-24 DIAGNOSIS — L89312 Pressure ulcer of right buttock, stage 2: Secondary | ICD-10-CM | POA: Diagnosis present

## 2022-01-24 DIAGNOSIS — I639 Cerebral infarction, unspecified: Secondary | ICD-10-CM | POA: Diagnosis not present

## 2022-01-24 DIAGNOSIS — R32 Unspecified urinary incontinence: Secondary | ICD-10-CM | POA: Diagnosis not present

## 2022-01-24 DIAGNOSIS — R778 Other specified abnormalities of plasma proteins: Secondary | ICD-10-CM | POA: Diagnosis not present

## 2022-01-24 DIAGNOSIS — R059 Cough, unspecified: Secondary | ICD-10-CM

## 2022-01-24 DIAGNOSIS — F1021 Alcohol dependence, in remission: Secondary | ICD-10-CM | POA: Diagnosis present

## 2022-01-24 DIAGNOSIS — Z79899 Other long term (current) drug therapy: Secondary | ICD-10-CM

## 2022-01-24 DIAGNOSIS — E1140 Type 2 diabetes mellitus with diabetic neuropathy, unspecified: Secondary | ICD-10-CM | POA: Diagnosis present

## 2022-01-24 DIAGNOSIS — R29818 Other symptoms and signs involving the nervous system: Secondary | ICD-10-CM | POA: Diagnosis not present

## 2022-01-24 DIAGNOSIS — E1122 Type 2 diabetes mellitus with diabetic chronic kidney disease: Secondary | ICD-10-CM | POA: Diagnosis not present

## 2022-01-24 DIAGNOSIS — N400 Enlarged prostate without lower urinary tract symptoms: Secondary | ICD-10-CM | POA: Diagnosis present

## 2022-01-24 DIAGNOSIS — I6523 Occlusion and stenosis of bilateral carotid arteries: Secondary | ICD-10-CM | POA: Diagnosis not present

## 2022-01-24 DIAGNOSIS — I700 Atherosclerosis of aorta: Secondary | ICD-10-CM | POA: Diagnosis not present

## 2022-01-24 DIAGNOSIS — I6529 Occlusion and stenosis of unspecified carotid artery: Secondary | ICD-10-CM | POA: Diagnosis not present

## 2022-01-24 DIAGNOSIS — I6501 Occlusion and stenosis of right vertebral artery: Secondary | ICD-10-CM | POA: Diagnosis not present

## 2022-01-24 DIAGNOSIS — G8929 Other chronic pain: Secondary | ICD-10-CM | POA: Diagnosis present

## 2022-01-24 DIAGNOSIS — N1832 Chronic kidney disease, stage 3b: Secondary | ICD-10-CM | POA: Diagnosis present

## 2022-01-24 DIAGNOSIS — G319 Degenerative disease of nervous system, unspecified: Secondary | ICD-10-CM | POA: Diagnosis not present

## 2022-01-24 DIAGNOSIS — I6389 Other cerebral infarction: Secondary | ICD-10-CM | POA: Diagnosis not present

## 2022-01-24 DIAGNOSIS — I63532 Cerebral infarction due to unspecified occlusion or stenosis of left posterior cerebral artery: Secondary | ICD-10-CM | POA: Diagnosis not present

## 2022-01-24 DIAGNOSIS — I10 Essential (primary) hypertension: Secondary | ICD-10-CM | POA: Diagnosis not present

## 2022-01-24 DIAGNOSIS — I872 Venous insufficiency (chronic) (peripheral): Secondary | ICD-10-CM | POA: Diagnosis present

## 2022-01-24 DIAGNOSIS — I482 Chronic atrial fibrillation, unspecified: Secondary | ICD-10-CM | POA: Diagnosis not present

## 2022-01-24 DIAGNOSIS — U071 COVID-19: Secondary | ICD-10-CM | POA: Diagnosis not present

## 2022-01-24 DIAGNOSIS — J449 Chronic obstructive pulmonary disease, unspecified: Secondary | ICD-10-CM | POA: Diagnosis not present

## 2022-01-24 DIAGNOSIS — I5032 Chronic diastolic (congestive) heart failure: Secondary | ICD-10-CM | POA: Diagnosis not present

## 2022-01-24 DIAGNOSIS — I454 Nonspecific intraventricular block: Secondary | ICD-10-CM | POA: Diagnosis present

## 2022-01-24 DIAGNOSIS — I63332 Cerebral infarction due to thrombosis of left posterior cerebral artery: Secondary | ICD-10-CM | POA: Diagnosis not present

## 2022-01-24 DIAGNOSIS — Z87891 Personal history of nicotine dependence: Secondary | ICD-10-CM

## 2022-01-24 DIAGNOSIS — R6889 Other general symptoms and signs: Secondary | ICD-10-CM | POA: Diagnosis not present

## 2022-01-24 DIAGNOSIS — I4821 Permanent atrial fibrillation: Secondary | ICD-10-CM | POA: Diagnosis present

## 2022-01-24 DIAGNOSIS — I509 Heart failure, unspecified: Secondary | ICD-10-CM | POA: Diagnosis not present

## 2022-01-24 DIAGNOSIS — E119 Type 2 diabetes mellitus without complications: Secondary | ICD-10-CM | POA: Diagnosis not present

## 2022-01-24 DIAGNOSIS — I4892 Unspecified atrial flutter: Secondary | ICD-10-CM | POA: Diagnosis present

## 2022-01-24 DIAGNOSIS — Z8249 Family history of ischemic heart disease and other diseases of the circulatory system: Secondary | ICD-10-CM

## 2022-01-24 DIAGNOSIS — Z7401 Bed confinement status: Secondary | ICD-10-CM | POA: Diagnosis not present

## 2022-01-24 DIAGNOSIS — E872 Acidosis, unspecified: Secondary | ICD-10-CM | POA: Diagnosis not present

## 2022-01-24 DIAGNOSIS — A419 Sepsis, unspecified organism: Principal | ICD-10-CM | POA: Diagnosis present

## 2022-01-24 DIAGNOSIS — I495 Sick sinus syndrome: Secondary | ICD-10-CM | POA: Diagnosis present

## 2022-01-24 DIAGNOSIS — N390 Urinary tract infection, site not specified: Secondary | ICD-10-CM | POA: Diagnosis not present

## 2022-01-24 DIAGNOSIS — Z7901 Long term (current) use of anticoagulants: Secondary | ICD-10-CM

## 2022-01-24 DIAGNOSIS — Z881 Allergy status to other antibiotic agents status: Secondary | ICD-10-CM

## 2022-01-24 DIAGNOSIS — G459 Transient cerebral ischemic attack, unspecified: Secondary | ICD-10-CM | POA: Diagnosis present

## 2022-01-24 DIAGNOSIS — I248 Other forms of acute ischemic heart disease: Secondary | ICD-10-CM | POA: Diagnosis not present

## 2022-01-24 DIAGNOSIS — F05 Delirium due to known physiological condition: Secondary | ICD-10-CM | POA: Diagnosis not present

## 2022-01-24 DIAGNOSIS — R4781 Slurred speech: Secondary | ICD-10-CM | POA: Diagnosis not present

## 2022-01-24 DIAGNOSIS — I4891 Unspecified atrial fibrillation: Secondary | ICD-10-CM | POA: Diagnosis not present

## 2022-01-24 DIAGNOSIS — I4729 Other ventricular tachycardia: Secondary | ICD-10-CM | POA: Diagnosis not present

## 2022-01-24 DIAGNOSIS — R29810 Facial weakness: Secondary | ICD-10-CM | POA: Diagnosis not present

## 2022-01-24 DIAGNOSIS — E876 Hypokalemia: Secondary | ICD-10-CM | POA: Diagnosis not present

## 2022-01-24 DIAGNOSIS — Z8546 Personal history of malignant neoplasm of prostate: Secondary | ICD-10-CM

## 2022-01-24 DIAGNOSIS — I6782 Cerebral ischemia: Secondary | ICD-10-CM | POA: Diagnosis not present

## 2022-01-24 DIAGNOSIS — M545 Low back pain, unspecified: Secondary | ICD-10-CM | POA: Diagnosis present

## 2022-01-24 LAB — APTT: aPTT: 33 seconds (ref 24–36)

## 2022-01-24 LAB — GLUCOSE, CAPILLARY: Glucose-Capillary: 100 mg/dL — ABNORMAL HIGH (ref 70–99)

## 2022-01-24 LAB — LACTIC ACID, PLASMA
Lactic Acid, Venous: 3 mmol/L (ref 0.5–1.9)
Lactic Acid, Venous: 2.4 mmol/L (ref 0.5–1.9)

## 2022-01-24 LAB — URINALYSIS, ROUTINE W REFLEX MICROSCOPIC
Bilirubin Urine: NEGATIVE
Glucose, UA: NEGATIVE mg/dL
Ketones, ur: NEGATIVE mg/dL
Nitrite: NEGATIVE
Protein, ur: >=300 mg/dL — AB
Specific Gravity, Urine: 1.024 (ref 1.005–1.030)
pH: 6 (ref 5.0–8.0)

## 2022-01-24 LAB — PROTIME-INR
INR: 1.2 (ref 0.8–1.2)
Prothrombin Time: 15.3 seconds — ABNORMAL HIGH (ref 11.4–15.2)

## 2022-01-24 LAB — DIFFERENTIAL
Abs Immature Granulocytes: 0.04 10*3/uL (ref 0.00–0.07)
Basophils Absolute: 0 10*3/uL (ref 0.0–0.1)
Basophils Relative: 0 %
Eosinophils Absolute: 0 10*3/uL (ref 0.0–0.5)
Eosinophils Relative: 0 %
Immature Granulocytes: 1 %
Lymphocytes Relative: 15 %
Lymphs Abs: 1 10*3/uL (ref 0.7–4.0)
Monocytes Absolute: 1 10*3/uL (ref 0.1–1.0)
Monocytes Relative: 15 %
Neutro Abs: 4.8 10*3/uL (ref 1.7–7.7)
Neutrophils Relative %: 69 %

## 2022-01-24 LAB — COMPREHENSIVE METABOLIC PANEL
ALT: 12 U/L (ref 0–44)
AST: 28 U/L (ref 15–41)
Albumin: 2.6 g/dL — ABNORMAL LOW (ref 3.5–5.0)
Alkaline Phosphatase: 132 U/L — ABNORMAL HIGH (ref 38–126)
Anion gap: 13 (ref 5–15)
BUN: 20 mg/dL (ref 8–23)
CO2: 20 mmol/L — ABNORMAL LOW (ref 22–32)
Calcium: 8.9 mg/dL (ref 8.9–10.3)
Chloride: 102 mmol/L (ref 98–111)
Creatinine, Ser: 1.79 mg/dL — ABNORMAL HIGH (ref 0.61–1.24)
GFR, Estimated: 39 mL/min — ABNORMAL LOW (ref >60)
Glucose, Bld: 106 mg/dL — ABNORMAL HIGH (ref 70–99)
Potassium: 4.3 mmol/L (ref 3.5–5.1)
Sodium: 135 mmol/L (ref 135–145)
Total Bilirubin: 0.7 mg/dL (ref 0.3–1.2)
Total Protein: 8.8 g/dL — ABNORMAL HIGH (ref 6.5–8.1)

## 2022-01-24 LAB — CBC
HCT: 42.5 % (ref 39.0–52.0)
Hemoglobin: 13.3 g/dL (ref 13.0–17.0)
MCH: 27 pg (ref 26.0–34.0)
MCHC: 31.3 g/dL (ref 30.0–36.0)
MCV: 86.4 fL (ref 80.0–100.0)
Platelets: 181 10*3/uL (ref 150–400)
RBC: 4.92 MIL/uL (ref 4.22–5.81)
RDW: 15.8 % — ABNORMAL HIGH (ref 11.5–15.5)
WBC: 6.8 10*3/uL (ref 4.0–10.5)
nRBC: 0 % (ref 0.0–0.2)

## 2022-01-24 LAB — RAPID URINE DRUG SCREEN, HOSP PERFORMED
Amphetamines: NOT DETECTED
Barbiturates: NOT DETECTED
Benzodiazepines: NOT DETECTED
Cocaine: NOT DETECTED
Opiates: NOT DETECTED
Tetrahydrocannabinol: NOT DETECTED

## 2022-01-24 LAB — RESP PANEL BY RT-PCR (FLU A&B, COVID) ARPGX2
Influenza A by PCR: NEGATIVE
Influenza B by PCR: NEGATIVE
SARS Coronavirus 2 by RT PCR: POSITIVE — AB

## 2022-01-24 LAB — TROPONIN I (HIGH SENSITIVITY)
Troponin I (High Sensitivity): 94 ng/L — ABNORMAL HIGH (ref <18)
Troponin I (High Sensitivity): 138 ng/L (ref <18)
Troponin I (High Sensitivity): 140 ng/L (ref <18)

## 2022-01-24 LAB — TSH: TSH: 1.363 u[IU]/mL (ref 0.350–4.500)

## 2022-01-24 LAB — ETHANOL: Alcohol, Ethyl (B): <10 mg/dL (ref <10)

## 2022-01-24 LAB — AMMONIA: Ammonia: 21 umol/L (ref 9–35)

## 2022-01-24 LAB — CBG MONITORING, ED: Glucose-Capillary: 126 mg/dL — ABNORMAL HIGH (ref 70–99)

## 2022-01-24 MED ORDER — LORAZEPAM 1 MG PO TABS: 0.5000 mg | ORAL_TABLET | Freq: Once | ORAL | Status: DC

## 2022-01-24 MED ORDER — SODIUM CHLORIDE 0.9 % IV SOLN
1.0000 g | INTRAVENOUS | Status: AC
  Administered 2022-01-25 – 2022-01-28 (×4): 1 g via INTRAVENOUS
  Filled 2022-01-24 (×4): qty 10

## 2022-01-24 MED ORDER — PRAVASTATIN SODIUM 40 MG PO TABS
40.0000 mg | ORAL_TABLET | Freq: Every day | ORAL | Status: DC
  Administered 2022-01-24 – 2022-01-25 (×2): 40 mg via ORAL
  Filled 2022-01-24 (×2): qty 1

## 2022-01-24 MED ORDER — HYDRALAZINE HCL 20 MG/ML IJ SOLN
5.0000 mg | Freq: Four times a day (QID) | INTRAMUSCULAR | Status: DC | PRN
Start: 2022-01-24 — End: 2022-01-30
  Administered 2022-01-26 – 2022-01-27 (×2): 5 mg via INTRAVENOUS
  Filled 2022-01-24 (×2): qty 1

## 2022-01-24 MED ORDER — AMLODIPINE BESYLATE 10 MG PO TABS
10.0000 mg | ORAL_TABLET | Freq: Every day | ORAL | Status: DC
  Administered 2022-01-24 – 2022-01-25 (×2): 10 mg via ORAL
  Filled 2022-01-24 (×2): qty 1

## 2022-01-24 MED ORDER — ACETAMINOPHEN 325 MG PO TABS: 650.0000 mg | ORAL_TABLET | Freq: Four times a day (QID) | ORAL | Status: DC | PRN

## 2022-01-24 MED ORDER — ACETAMINOPHEN 325 MG PO TABS
650.0000 mg | ORAL_TABLET | Freq: Once | ORAL | Status: AC
Start: 2022-01-24 — End: 2022-01-24
  Administered 2022-01-24: 650 mg via ORAL
  Filled 2022-01-24: qty 2

## 2022-01-24 MED ORDER — LACTATED RINGERS IV BOLUS
1000.0000 mL | Freq: Once | INTRAVENOUS | Status: AC
  Administered 2022-01-24: 1000 mL via INTRAVENOUS

## 2022-01-24 MED ORDER — POLYETHYLENE GLYCOL 3350 17 G PO PACK: 17.0000 g | PACK | Freq: Every day | ORAL | Status: DC | PRN

## 2022-01-24 MED ORDER — POTASSIUM CHLORIDE CRYS ER 10 MEQ PO TBCR
10.0000 meq | EXTENDED_RELEASE_TABLET | Freq: Every day | ORAL | Status: DC
Start: 2022-01-25 — End: 2022-01-30
  Administered 2022-01-25 – 2022-01-30 (×6): 10 meq via ORAL
  Filled 2022-01-24 (×11): qty 1

## 2022-01-24 MED ORDER — STROKE: EARLY STAGES OF RECOVERY BOOK
Freq: Once | Status: AC
  Filled 2022-01-24: qty 1

## 2022-01-24 MED ORDER — INSULIN ASPART 100 UNIT/ML IJ SOLN
0.0000 [IU] | Freq: Three times a day (TID) | INTRAMUSCULAR | Status: DC
  Administered 2022-01-26 – 2022-01-29 (×2): 3 [IU] via SUBCUTANEOUS
  Administered 2022-01-29: 4 [IU] via SUBCUTANEOUS

## 2022-01-24 MED ORDER — METOPROLOL TARTRATE 12.5 MG HALF TABLET
12.5000 mg | ORAL_TABLET | Freq: Two times a day (BID) | ORAL | Status: DC
  Administered 2022-01-24 – 2022-01-27 (×7): 12.5 mg via ORAL
  Filled 2022-01-24 (×7): qty 1

## 2022-01-24 MED ORDER — SENNOSIDES-DOCUSATE SODIUM 8.6-50 MG PO TABS: 1.0000 | ORAL_TABLET | Freq: Every evening | ORAL | Status: DC | PRN

## 2022-01-24 MED ORDER — SODIUM CHLORIDE 0.9 % IV SOLN: INTRAVENOUS | Status: DC

## 2022-01-24 MED ORDER — FUROSEMIDE 20 MG PO TABS
40.0000 mg | ORAL_TABLET | Freq: Every day | ORAL | Status: DC
  Administered 2022-01-25 – 2022-01-30 (×6): 40 mg via ORAL
  Filled 2022-01-24 (×6): qty 2

## 2022-01-24 MED ORDER — APIXABAN 5 MG PO TABS
5.0000 mg | ORAL_TABLET | Freq: Two times a day (BID) | ORAL | Status: DC
  Administered 2022-01-24 – 2022-01-30 (×12): 5 mg via ORAL
  Filled 2022-01-24 (×12): qty 1

## 2022-01-24 MED ORDER — ACETAMINOPHEN 325 MG PO TABS
650.0000 mg | ORAL_TABLET | ORAL | Status: DC | PRN
  Administered 2022-01-27 – 2022-01-30 (×6): 650 mg via ORAL
  Filled 2022-01-24 (×6): qty 2

## 2022-01-24 MED ORDER — LORAZEPAM 2 MG/ML IJ SOLN
1.0000 mg | Freq: Once | INTRAMUSCULAR | Status: AC
  Administered 2022-01-24: 1 mg via INTRAVENOUS
  Filled 2022-01-24: qty 1

## 2022-01-24 MED ORDER — ACETAMINOPHEN 650 MG RE SUPP: 650.0000 mg | RECTAL | Status: DC | PRN

## 2022-01-24 MED ORDER — HYDROCODONE BIT-HOMATROP MBR 5-1.5 MG/5ML PO SOLN
5.0000 mL | ORAL | Status: DC | PRN
  Administered 2022-01-24 – 2022-01-26 (×2): 5 mL via ORAL
  Filled 2022-01-24 (×2): qty 5

## 2022-01-24 MED ORDER — ALBUTEROL SULFATE (2.5 MG/3ML) 0.083% IN NEBU: 2.5000 mg | INHALATION_SOLUTION | Freq: Four times a day (QID) | RESPIRATORY_TRACT | Status: DC | PRN

## 2022-01-24 MED ORDER — ACETAMINOPHEN 160 MG/5ML PO SOLN
650.0000 mg | ORAL | Status: DC | PRN
  Administered 2022-01-29: 650 mg
  Filled 2022-01-24: qty 20.3

## 2022-01-24 MED ORDER — SODIUM CHLORIDE 0.9 % IV SOLN
1.0000 g | Freq: Once | INTRAVENOUS | Status: AC
  Administered 2022-01-24: 1 g via INTRAVENOUS
  Filled 2022-01-24: qty 10

## 2022-01-24 NOTE — Assessment & Plan Note (Signed)
Patient had fever to 101.2 at admission. CBCD nl. CXR w/o active disease. U/A mildly positive with bacteria but only 6-10 WBC/hpf. Possibly aspirated as source of fever. Given 1 g rocephin in ED   Plan Continue rocephin while workup in progress.  F/u CXR in AM

## 2022-01-24 NOTE — ED Notes (Signed)
Patient transported to MRI 

## 2022-01-24 NOTE — Assessment & Plan Note (Signed)
Patient with multiple cardiac risk factors, DM, HTN< HLD, obesity, h/o CVA. Troponin mildly elevated to 94. EKG w/o definite acute changes but question of inferior injury.  Plan Cycle troponins  EKG in AM or as needed if he develops cardiac symptoms.

## 2022-01-24 NOTE — ED Triage Notes (Signed)
Pt BIB [facility] from home as a code stroke. Per EMS,pt's daughter got home noticing pt was exhibiting L facial droop along w slurred speech. Pt has also be incontinent since last night. Pt's LSN was last night. Pt came in w BP in the 200s. A&O X4.

## 2022-01-24 NOTE — Assessment & Plan Note (Signed)
Patient with multiple risk factors including DM, HTN,HLD, obesity. Symptoms were transient. Neurology exam per Dr. Leonie Man was non-focal. Patient on Eliquis  Plan Continue Eliquis  Continue risk factor modification  MRI brain, MRA brain and neck  Echo  PT/OT/SLP evaluation

## 2022-01-24 NOTE — Subjective & Objective (Signed)
[name] is a 76 year old African-American male with prior history of diabetes, hypertension, hyperlipidemia, COPD, paroxysmal A-fib on anticoagulation with Eliquis and history of left occipital infarct 2 years ago.  Per his daughters report he wasn't himself 01/23/22, had a fall and has had urinary incontinence. He was last seen at [facility] 01/23/22. This AM his daughter reports he was dysarthric and had right sided weakness. EMS activated. His symptoms seemed to improve in transit t [facility].

## 2022-01-24 NOTE — ED Provider Notes (Signed)
[facility] Provider Note   CSN: [id number] Arrival date & time: 01/24/22  1223     History  No chief complaint on file.   [name] is a 76 y.o. male.  Patient brought in by EMS as code stroke.  He has a history of hypertension, atrial fibrillation on Eliquis, diabetes, prostate cancer.  His family found him to have difficulty speaking as well as right-sided weakness.  Last normal was 10 PM last night.  On arrival patient has no appreciable facial droop and is moving all extremities equally.  Denies any pain.  Denies any headache, neck pain, chest pain, abdominal pain, shortness of breath.  No fever. Denies any pain with urination or blood in the urine.  Does have some cough which is chronic and unchanged  Patient's daughter at bedside reports he had a fall 2 days ago but denies any injury from the fall.  He seemed "out of it" yesterday but she noticed slurred speech this morning with increased confusion and EMS was called.  He is also incontinent for the past several days which is new. Daughter reports that speech has improved but he still seems confused and generally weak overall.  No fever, chills, nausea vomiting, chest pain or shortness of breath  The history is provided by the patient and the EMS personnel. The history is limited by the condition of the patient.       Home Medications Prior to Admission medications   Medication Sig Start Date End Date Taking? Authorizing Provider  acetaminophen (TYLENOL) 325 MG tablet Take 2 tablets (650 mg total) by mouth every 6 (six) hours as needed for mild pain or moderate pain. 02/23/20   Domenic Polite, MD  amLODipine (NORVASC) 10 MG tablet TAKE 1 TABLET BY MOUTH  DAILY 09/23/21   Leonie Man, MD  ELIQUIS 5 MG TABS tablet TAKE 1 TABLET BY MOUTH  TWICE DAILY 01/19/22   Biagio Borg, MD  furosemide (LASIX) 40 MG tablet TAKE 1 TABLET BY MOUTH  DAILY MAY TAKE AN  ADDITIONAL 1 TABLET IF  NEEDED  FOR SWELLING 09/27/21   Leonie Man, MD  Lancet Devices [facility] PLUS LANCING) [place]  09/19/20   [provider]  Lancets (ONETOUCH DELICA PLUS [id number]) [place] USE ONCE DAILY 10/29/20   Biagio Borg, MD  loratadine (CLARITIN) 10 MG tablet Take 10 mg by mouth daily as needed for allergies.    [provider]  metoprolol tartrate (LOPRESSOR) 25 MG tablet Take 0.5 tablets (12.5 mg total) by mouth 2 (two) times daily. 11/18/21   Biagio Borg, MD  polyethylene glycol (MIRALAX / GLYCOLAX) 17 g packet Take 17 g by mouth daily as needed for moderate constipation. 02/23/20   Domenic Polite, MD  potassium chloride (KLOR-CON) 10 MEQ tablet Take 1 tablet (10 mEq total) by mouth daily. 12/04/21   Biagio Borg, MD  pravastatin (PRAVACHOL) 40 MG tablet Take 1 tablet (40 mg total) by mouth daily. Please make an appointment with cardiologist for further refills 12/09/21   Leonie Man, MD  [facility] HFA [phone] Base) MCG/ACT inhaler INHALE 2 PUFFS INTO THE  LUNGS EVERY 6 HOURS AS  NEEDED Patient taking differently: Inhale 2 puffs into the lungs every 6 (six) hours as needed for shortness of breath or wheezing. 06/01/20   Biagio Borg, MD      Allergies    Ciprofloxacin    Review of Systems   Review of  Systems  Unable to perform ROS: Mental status change    Physical Exam Updated Vital Signs BP (!) 157/99 (BP Location: Right Arm)   Pulse 81   Temp (!) 101.2 F (38.4 C) (Oral)   Resp (!) 31   SpO2 97%  Physical Exam Vitals and nursing note reviewed.  Constitutional:      General: He is not in acute distress.    Appearance: He is well-developed. He is obese.     Comments: Oriented to person and place.  Believes the month is May  HENT:     Head: Normocephalic and atraumatic.     Mouth/Throat:     Pharynx: No oropharyngeal exudate.  Eyes:     Conjunctiva/sclera: Conjunctivae normal.     Pupils: Pupils are equal, round, and reactive to light.  Neck:     Comments: No  meningismus. Cardiovascular:     Rate and Rhythm: Normal rate. Rhythm irregular.     Heart sounds: Murmur heard.  Pulmonary:     Effort: Pulmonary effort is normal. No respiratory distress.     Breath sounds: Normal breath sounds.  Abdominal:     Palpations: Abdomen is soft.     Tenderness: There is no abdominal tenderness. There is no guarding or rebound.  Genitourinary:    Comments: Incontinent of urine Musculoskeletal:        General: Swelling present. No tenderness. Normal range of motion.     Cervical back: Normal range of motion and neck supple.     Comments: Swelling to legs bilaterally.  Chronic appearing wound to right shin without surrounding erythema or drainage  Skin:    General: Skin is warm.  Neurological:     Mental Status: He is alert.     Cranial Nerves: No cranial nerve deficit.     Motor: No abnormal muscle tone.     Coordination: Coordination normal.     Comments: Cranial nerves II to XII intact, 5/5 strength throughout, able to raise arms and legs off the bed bilaterally R hemianopsia.  Psychiatric:        Behavior: Behavior normal.    ED Results / Procedures / Treatments   Labs (all labs ordered are listed, but only abnormal results are displayed) Labs Reviewed  CBC - Abnormal; Notable for the following components:      Result Value   RDW 15.8 (*)    All other components within normal limits  LACTIC ACID, PLASMA - Abnormal; Notable for the following components:   Lactic Acid, Venous 3.0 (*)    All other components within normal limits  PROTIME-INR - Abnormal; Notable for the following components:   Prothrombin Time 15.3 (*)    All other components within normal limits  COMPREHENSIVE METABOLIC PANEL - Abnormal; Notable for the following components:   CO2 20 (*)    Glucose, Bld 106 (*)    Creatinine, Ser 1.79 (*)    Total Protein 8.8 (*)    Albumin 2.6 (*)    Alkaline Phosphatase 132 (*)    GFR, Estimated 39 (*)    All other components within  normal limits  CBG MONITORING, ED - Abnormal; Notable for the following components:   Glucose-Capillary 126 (*)    All other components within normal limits  TROPONIN I (HIGH SENSITIVITY) - Abnormal; Notable for the following components:   Troponin I (High Sensitivity) 94 (*)    All other components within normal limits  RESP PANEL BY RT-PCR (FLU A&B, COVID) ARPGX2  CULTURE,  BLOOD (ROUTINE X 2)  CULTURE, BLOOD (ROUTINE X 2)  DIFFERENTIAL  TSH  APTT  ETHANOL  AMMONIA  RAPID URINE DRUG SCREEN, HOSP PERFORMED  URINALYSIS, ROUTINE W REFLEX MICROSCOPIC  LACTIC ACID, PLASMA  I-STAT CHEM 8, ED  TROPONIN I (HIGH SENSITIVITY)    EKG EKG Interpretation  Date/Time:  Friday January 24 2022 13:00:55 EDT Ventricular Rate:  85 PR Interval:    QRS Duration: 110 QT Interval:  393 QTC Calculation: 451 R Axis:   -88 Text Interpretation: Atrial fibrillation Ventricular premature complex LVH with secondary repolarization abnormality Probable inferior infarct, recent Anterior infarct, old No significant change was found Confirmed by Ezequiel Essex [id number]) on 01/24/2022 1:12:48 PM  Radiology DG Chest Portable 1 View  Result Date: 01/24/2022 CLINICAL DATA:  Altered mental status.  Cough. EXAM: PORTABLE CHEST 1 VIEW COMPARISON:  10/22/2020 FINDINGS: 1310 hours. Low volume film. The cardio pericardial silhouette is enlarged. The lungs are clear without focal pneumonia, edema, pneumothorax or pleural effusion. Bones are diffusely demineralized. Telemetry leads overlie the chest. IMPRESSION: Low volume film without acute cardiopulmonary findings. Electronically Signed   By: Misty Stanley M.D.   On: 01/24/2022 13:25   CT HEAD CODE STROKE WO CONTRAST  Result Date: 01/24/2022 CLINICAL DATA:  Code stroke. Provided history: Neuro deficit, acute, stroke suspected. EXAM: CT HEAD WITHOUT CONTRAST TECHNIQUE: Contiguous axial images were obtained from the base of the skull through the vertex without intravenous  contrast. RADIATION DOSE REDUCTION: This exam was performed according to the departmental dose-optimization program which includes automated exposure control, adjustment of the mA and/or kV according to patient size and/or use of iterative reconstruction technique. COMPARISON:  Prior head CT examinations 10/22/2020 and earlier. FINDINGS: Significantly motion degraded examination, limiting evaluation. Additionally, portions of the anteroinferior frontal lobes are excluded from the field of view (left greater than right). Brain: Moderate cerebral atrophy. Comparatively mild cerebellar atrophy. Moderate patchy and ill-defined hypoattenuation within the cerebral white matter, nonspecific but compatible with chronic small vessel disease. Known chronic cortical/subcortical left occipital lobe infarct (PCA vascular territory). Within the limitations of significant motion degradation, no definite intracranial hemorrhage or acute demarcated cortical infarct is identified. No appreciable intracranial mass or extra-axial fluid collection. No midline shift. Vascular: No hyperdense vessel. Atherosclerotic calcifications. Skull: No fracture or aggressive osseous lesion. Sinuses/Orbits: The orbits and the majority of the paranasal sinuses are excluded from the field of view. No appreciable significant paranasal sinus disease. ASPECTS ([place] Stroke Program Early CT Score) - Ganglionic level infarction (caudate, lentiform nuclei, internal capsule, insula, M1-M3 cortex): 7 - Supraganglionic infarction (M4-M6 cortex): 3 Total score (0-10 with 10 being normal): 10 Impressions #1 and #2 communicated to Dr. Leonie Man at 12:49 pmon 01/24/2022 by text page via the [facility] messaging system. IMPRESSION: 1. Examination significantly limited by motion degradation. Additionally, portions of the anteroinferior frontal lobes are excluded from the field of view (left greater than right). 2. Within this limitation, no definite acute intracranial  abnormality is identified. 3. Known chronic left occipital lobe PCA territory infarct. 4. Moderate chronic small ischemic changes within the cerebral white matter. 5. Moderate cerebral atrophy. 6. Comparatively mild cerebellar atrophy. Electronically Signed   By: Kellie Simmering D.O.   On: 01/24/2022 12:52    Procedures .Critical Care  Performed by: Ezequiel Essex, MD Authorized by: Ezequiel Essex, MD   Critical care provider statement:    Critical care time (minutes):  60   Critical care time was exclusive of:  Separately billable procedures and treating other  patients   Critical care was necessary to treat or prevent imminent or life-threatening deterioration of the following conditions:  Sepsis and CNS failure or compromise   Critical care was time spent personally by me on the following activities:  Development of treatment plan with patient or surrogate, discussions with consultants, evaluation of patient's response to treatment, examination of patient, ordering and review of laboratory studies, ordering and review of radiographic studies, ordering and performing treatments and interventions, pulse oximetry, re-evaluation of patient's condition and review of old charts   I assumed direction of critical care for this patient from another provider in my specialty: no     Care discussed with: admitting provider       Medications Ordered in ED Medications - No data to display  ED Course/ Medical Decision Making/ A&P                           Medical Decision Making Amount and/or Complexity of Data Reviewed Labs: ordered. Radiology: ordered.  Risk OTC drugs.  Code stroke on arrival.  Seen with Dr. Leonie Man with neurology team.  CT head is negative for hemorrhage. Results reviewed and interpreted by me.  Deficits are improving.  Patient not a candidate for thrombolytics given improving deficits as well as anticoagulation use.  Febrile on arrival to 101.2.  Nonfocal neurological exam  with improving deficits.  No indication for thrombolytics or intervention per neurology.  Initial labs appears to be hemolyzed with potassium greater than 8.5, creatinine 1.8.  Labs to be repeated.  Urinalysis is pending.  May explain patient's episode of incontinence if he has a UTI.  Daughter reports he did have a fall several days ago but no injury from the fall.  Need MRI of his lumbar spine given his incontinence when MRI brain is done as well.  Neurology recommendations: "Patient has presented outside time window for thrombolysis and new neurological deficits have resolved and clinical presentation is not suggestive of LVO  Recommend ; admit to medical team for further evaluation.  Close neurological monitoring with vital checks and neurochecks for the first 24 hours.  Check MRI scan of the brain, CT angiogram brain and neck if serum creatinine is okay otherwise MR angiogram of the brain and neck without contrast.  Check echocardiogram, lipid profile, hemoglobin A1c. Continue Eliquis for stroke prevention for A-fib after his swallow eval by bedside RN. Physical occupational and speech therapy consults."  Lactate is 3.  Patient given broad-spectrum antibiotics and IV fluids after cultures are obtained.  Suspect likely urinary source given his incontinence. Chest x-ray negative for pneumonia.  Creatinine slightly elevated compared to baseline 1.7.  Precludes use of IV contrast.  Will obtain MRA of head and neck.  Continue IV fluids and IV antibiotics.  Suspect likely UTI.  Urinalysis is pending.  Lumbar spine MRI is obtained as well given his new onset incontinence.  Will require admission for further stroke work-up and evaluation.  Discussed with Dr. Linda Hedges.         Final Clinical Impression(s) / ED Diagnoses Final diagnoses:  TIA (transient ischemic attack)  Fever, unspecified fever cause  Urinary incontinence, unspecified type    Rx / DC Orders ED Discharge Orders     None          Bess Saltzman, Annie Main, MD 01/24/22 1558

## 2022-01-24 NOTE — Assessment & Plan Note (Signed)
Stable rate.  Plan Continue home meds  Continue Eliquis

## 2022-01-24 NOTE — ED Notes (Signed)
Pt cleaned. Clean linen and gown applied. Pt on promofit.

## 2022-01-24 NOTE — Assessment & Plan Note (Signed)
Patient reports cough at home but no sputum production, no SOB, no wheezing. His coughing prevented MRI from being performed. Question of aspiration.  Plan Hycodan cough syrup as needed.  SLP evaluation

## 2022-01-24 NOTE — Progress Notes (Addendum)
Ninetta Lights MD notified overnight of elevated troponins & increased NIH - confusion, fam notes baseline confusion  PRN hycodan given for cough  On call contacted re: elevated BP, nonsustained 4 beat vtach '@0036'$   0255: On call V. Rathore MD notified via page neuro change, R neglect, sensory deficit, worsening aphasia

## 2022-01-24 NOTE — Telephone Encounter (Signed)
Pt daughter [name] called and stated pt had a fall. No visit available with Dr. Jenny Reichmann today; daughter will be taking pt to ER.   Fyi

## 2022-01-24 NOTE — Code Documentation (Signed)
Stroke Response Nurse Documentation Code Documentation  Tyrie Arnone is a 76 y.o. male arriving to [facility]  via [facility] on 01/24/22 with past medical hx of DM, COPD, HLD, HTN, PAF on eliquis, CHF, Prostate CA. On Eliquis (apixaban) daily. Code stroke was activated by EMS.   Patient from home with daughter where he was LKW 2200 last night 7/6. She left to run errands this morning.  And then returned to find him with slurred speech and right sided weakness.  She does report that yesterday 7/6 patient urinated on himself which is abnormal. He had another accident this morning just as EMS was called.   Stroke team at the bedside on patient arrival. Labs drawn and patient cleared for CT by Dr. Tomi Bamberger. Patient to CT with team. NIHSS 4, see documentation for details and code stroke times. Patient with disoriented, right hemianopia, and dysarthria  on exam.   The following imaging was completed:  CT Head. Patient is not a candidate for IV Thrombolytic due to outside window. Patient is not a candidate for IR due to symptoms mild, improving.   Care Plan: q2h mNIHSS and vitals; permissive HTN.   Bedside handoff with ED RN Chloe.    Candace Cruise K  Stroke Response RN

## 2022-01-24 NOTE — Assessment & Plan Note (Signed)
Patient with chronic venous stasis dermatitis w/o open lesions. Also mycotic nails. No need for additional therapy at this time.

## 2022-01-24 NOTE — ED Notes (Signed)
Pt's temp is 101.5. MD made aware. Tylenol ordered.

## 2022-01-24 NOTE — ED Notes (Signed)
Was only able to get 1 set of blood culture on pt. EDP Rancour made aware.

## 2022-01-24 NOTE — Consult Note (Signed)
Reason for Consult: Code stroke Referring Physician: Annie Main Rancour  [name] is an 76 y.o. male.  HPI: [name] is a 76 year old African-American male with prior history of diabetes, hypertension, hyperlipidemia, COPD, paroxysmal A-fib on anticoagulation with Eliquis and history of left occipital infarct 2 years ago.  Patient failed to provide meaningful history which is provided by EMS who stated that the patient apparently was fine last night and went to sleep.  This morning when the daughter arrived home back from the he noticed that he was having some slurred speech and right facial droop.  EMS confirmed that this appears to have improved by the time the patient reached the ER and was evaluated by me at the bridge speech was clear without facial weakness.  Neurological exam is significant for right homonymous hemianopsia which apparently is old since this stat CT scan of the head showed old left occipital cerebral infarct.  No abnormalities.  Patient apparently also had a fall few days ago and or called primary care physician and needed to be seen.  I spoke to his daughter [name] over the phone who informed me that she first noticed the patient was not doing well yesterday and was slightly more his usual self and had urinary incontinence.  Today she noticed that he had some slurred speech as well and more urinary incontinence so she called 911. At baseline the patient ambulates with a walker.  His daughter is responsible for his medicines.  Patient states he had history of stroke 2 years ago denies significant residual deficits.  IV tPA considered no resolving symptoms and possibly outside window Mechanical thrombectomy no presentation not consistent with LVO NIH stroke scale 3 Premorbid modified Rankin score 2 Past Medical History:  Diagnosis Date   Adenoma 05/2008   Alcoholism in recovery [facility])    BPH (benign prostatic hyperplasia)    Cancer (HCC)    Prostate   Chronic LBP     Hip & Back -- Sees Dr. Maia Petties @ [place]   Colon polyps    Complex renal cyst 12/2010   Controlled type 2 diabetes mellitus with neuropathy ([place]) 01/2007   COPD (chronic obstructive pulmonary disease) ([place])    Diabetes ([place])    Diverticulitis of colon    ED (erectile dysfunction)    s/p Penile prosthesis (09/2010)   History of prostate cancer    Dr. Karsten Ro   History of sick sinus syndrome    reduced BB dose for Bradycardia   HLD (hyperlipidemia)    Hypertension    Impaired glucose tolerance 12/21/2013   Nephrolithiasis    Osteoarthritis of both hips    Osteoarthritis of lumbosacral spine    with Disc Disease   PAF (paroxysmal atrial fibrillation) (HCC)    No longer on Amiodarone.  Not on Anticoaguation b/c no recurrence.   Paresthesias/numbness    Bilateral LE   Peripheral neuropathy 12/21/2013    Past Surgical History:  Procedure Laterality Date   LEFT HEART CATH AND CORONARY ANGIOGRAPHY  2003   Normal Coronary Arteries.   NM MYOVIEW LTD  02/21/2020   EF 60%.  Medium sized mild severity defect in the basal inferior, mid inferior and apical inferior location-suggesting of ischemia.  Read as low-intermediate risk.  (On cardiology review this is felt to be a fixed defect either related to prior infarct versus diaphragmatic attenuation.  Felt to be low risk)   PENILE PROSTHESIS IMPLANT  06/21/2012   Procedure: PENILE PROTHESIS INFLATABLE;  Surgeon: Elta Guadeloupe  Nedra Hai, MD;  Location: [facility];  Service: Urology;  Laterality: N/A;  REMOVAL AND REPLACEMENT OF SOME  OF PROSTHESIS ([facility])    PENILE PROSTHESIS PLACEMENT  09/2010   PROSTATECTOMY     REMOVAL OF PENILE PROSTHESIS N/A 02/15/2018   Procedure: REMOVAL OF PENILE PROSTHESIS;  Surgeon: Kathie Rhodes, MD;  Location: [facility];  Service: Urology;  Laterality: N/A;   TRANSTHORACIC ECHOCARDIOGRAM  04/2016   a) Relatively normal EF of 55-60%. No RWMA suggesting no prior MI. Gr 2 DD (pseudo-normal filling pattern)  along with moderately dilated left atrium.;; b) 02/2018: Mod LVH. EF 65-70%. No RWMA.  Unable to assess DF 2/2 Afib. Mild MR. Severe BiAtrial Enlargement. High CVP.   TRANSTHORACIC ECHOCARDIOGRAM  02/17/2020   EF 60 to 65%.  No R WMA.  Unable to assess diastolic parameters because of A. fib.  Mildly elevated PA pressures.  Mild LA dilation.  Mild aortic valve sclerosis but no stenosis.  Normal IVC.    Family History  Problem Relation Age of Onset   Coronary artery disease Mother    Heart attack Mother    Coronary artery disease Father    Prostate cancer Father    Diabetes Father     Social History:  reports that he quit smoking about 39 years ago. His smoking use included cigarettes. He has never used smokeless tobacco. He reports current alcohol use. He reports that he does not use drugs.  Allergies:  Allergies  Allergen Reactions   Ciprofloxacin Other (See Comments)    All over weakness    Medications: I have reviewed the patient's current medications.  Results for orders placed or performed during the hospital encounter of 01/24/22 (from the past 48 hour(s))  CBG monitoring, ED     Status: Abnormal   Collection Time: 01/24/22 12:25 PM  Result Value Ref Range   Glucose-Capillary 126 (H) 70 - 99 mg/dL    Comment: Glucose reference range applies only to samples taken after fasting for at least 8 hours.    CT HEAD CODE STROKE WO CONTRAST  Result Date: 01/24/2022 CLINICAL DATA:  Code stroke. Provided history: Neuro deficit, acute, stroke suspected. EXAM: CT HEAD WITHOUT CONTRAST TECHNIQUE: Contiguous axial images were obtained from the base of the skull through the vertex without intravenous contrast. RADIATION DOSE REDUCTION: This exam was performed according to the departmental dose-optimization program which includes automated exposure control, adjustment of the mA and/or kV according to patient size and/or use of iterative reconstruction technique. COMPARISON:  Prior head CT  examinations 10/22/2020 and earlier. FINDINGS: Significantly motion degraded examination, limiting evaluation. Additionally, portions of the anteroinferior frontal lobes are excluded from the field of view (left greater than right). Brain: Moderate cerebral atrophy. Comparatively mild cerebellar atrophy. Moderate patchy and ill-defined hypoattenuation within the cerebral white matter, nonspecific but compatible with chronic small vessel disease. Known chronic cortical/subcortical left occipital lobe infarct (PCA vascular territory). Within the limitations of significant motion degradation, no definite intracranial hemorrhage or acute demarcated cortical infarct is identified. No appreciable intracranial mass or extra-axial fluid collection. No midline shift. Vascular: No hyperdense vessel. Atherosclerotic calcifications. Skull: No fracture or aggressive osseous lesion. Sinuses/Orbits: The orbits and the majority of the paranasal sinuses are excluded from the field of view. No appreciable significant paranasal sinus disease. ASPECTS [facility] Stroke Program Early CT Score) - Ganglionic level infarction (caudate, lentiform nuclei, internal capsule, insula, M1-M3 cortex): 7 - Supraganglionic infarction (M4-M6 cortex): 3 Total score (0-10 with 10 being normal): 10  Impressions #1 and #2 communicated to Dr. Leonie Man at 12:49 pmon 01/24/2022 by text page via the [facility] messaging system. IMPRESSION: 1. Examination significantly limited by motion degradation. Additionally, portions of the anteroinferior frontal lobes are excluded from the field of view (left greater than right). 2. Within this limitation, no definite acute intracranial abnormality is identified. 3. Known chronic left occipital lobe PCA territory infarct. 4. Moderate chronic small ischemic changes within the cerebral white matter. 5. Moderate cerebral atrophy. 6. Comparatively mild cerebellar atrophy. Electronically Signed   By: Kellie Simmering D.O.   On: 01/24/2022  12:52   CT-scan of the brain: No acute finding.  Old left occipital PCA territory infarct.  Moderate cerebral atrophy.  Moderate white matter changes. CBG 126 mg percent.   ROS There were no vitals taken for this visit.  Physical Exam Obese elderly African-American male not in distress.  He is urinary incontinent with soiled trousers.  He has bilateral lymphedema of the legs lateral swelling.  There is a bandage on his right foot.  Distal pulses are felt.  NIHSS 1a Level of Conscious.: 0 1b LOC Questions: 1  1c LOC Commands0: 0 2 Best Gaze: 0 3 Visual: 2 4 Facial Palsy: 0 5a Motor Arm - left: 0 5b Motor Arm - Right: 0 6a Motor Leg - Left: 0 6b Motor Leg - Right: 0 7 Limb Ataxia: 0 8 Sensory: 0 9 Best Language: 0 10 Dysarthria: 0 11 Extinct. and Inatten.: 0 TOTAL: 3 Neurological exam.  Patient is awake alert oriented x2.  No dysarthria aphasia or apraxia.  Follows commands well.  Extraocular movements are full range without nystagmus.  Dense right homonymous hemianopsia.  Pupils equal reactive.  Face is symmetric without weakness.  Tongue midline.  Motor system exam no upper or lower extremity drift with symmetric and equal strength with mild proximal symmetric weakness in lower extremities.  Sensation intact bilaterally.  Coordination slow but accurate.  Deep tendon reflexes are depressed.  Both plantars downgoing.  Gait not tested. Assessment/Plan: 76 year old African-American male with subacute mild confusion and some transient dysarthria and facial droop today possibly left hemispheric TIA in a patient with atrial fibrillation on anticoagulation with Eliquis.  He also has what looks like old left hemispheric infarct with residual right hemianopsia. Multiple vascular risk factors of diabetes, hypertension, hyperlipidemia, obesity, atrial fibrillation and prior stroke. Patient has presented outside time window for thrombolysis and new neurological deficits have resolved and clinical  presentation is not suggestive of LVO  Recommend ; admit to medical team for further evaluation.  Close neurological monitoring with vital checks and neurochecks for the first 24 hours.  Check MRI scan of the brain, CT angiogram brain and neck if serum creatinine is okay otherwise MR angiogram of the brain and neck without contrast.  Check echocardiogram, lipid profile, hemoglobin A1c. Continue Eliquis for stroke prevention for A-fib after his swallow eval by bedside RN. Physical occupational and speech therapy consults. Check UA for infection Chest x-ray Medical team for medical management. Stroke team will follow on consults.  Kindly call for questions. Long discussion with patient and his daughter over the phone with Dr. Wyvonnia Dusky Greater than 50% time during this 80-minute consultation visit was spent on counseling and coordination of care about his TIA and discussion about stroke evaluation and treatment and answering questions. Antony Contras 01/24/2022, 12:55 PM    Note: This document was prepared with digital dictation and possible smart phrase technology. Any transcriptional errors that result from this process  are unintentional.

## 2022-01-24 NOTE — Assessment & Plan Note (Signed)
Continue home meds. Lipid panel in AM

## 2022-01-24 NOTE — Assessment & Plan Note (Signed)
Patient appears well compensated  Plan Continue home regimen  2D echo

## 2022-01-24 NOTE — H&P (Signed)
History and Physical    [name] YYT:[id number] DOB: 1946/06/12 DOA: 01/24/2022  DOS: the patient was seen and examined on 01/24/2022  PCP: Biagio Borg, MD   Patient coming from: Home  I have personally briefly reviewed patient's old medical records in [facility]  [name] is a 76 year old African-American male with prior history of diabetes, hypertension, hyperlipidemia, COPD, paroxysmal A-fib on anticoagulation with Eliquis and history of left occipital infarct 2 years ago.  Per his daughters report he wasn't himself 01/23/22, had a fall and has had urinary incontinence. He was last seen at [facility] 01/23/22. This AM his daughter reports he was dysarthric and had right sided weakness. EMS activated. His symptoms seemed to improve in transit t [facility].   ED Course: Code Stroke called by EMS in transit. Pt seen at the bridge by Dr. Leonie Man for neurology who reported that symptoms seemed to have cleared. He was probable outside the window for thrombolytics and symtoms were not c/w LVO event. Emergent CT head was negative. Vitals T 101.2  BP 158/106  HR 81  RR 22. Lab notable for glucose 106, Cr 1.79 (chronic CKD), CBCD nl, TSH nl, Troponin 94. CXR NAD, EKG w/ a. Fib at rate of 85, LVH, old anterior injury, question of recent inferior injury.   Neuro recommeds [facility] admit to complete workup with MRI brain, MRA brain and neck, ECHO. In ED due to fever and abnl U/A 1G Rocephin administered. Appropriate cultures done.   Review of Systems:  Review of Systems  Constitutional:  Positive for weight loss. Negative for chills and fever.  HENT: Negative.    Eyes: Negative.   Respiratory:  Positive for cough. Negative for hemoptysis, sputum production and shortness of breath.   Cardiovascular:  Positive for leg swelling. Negative for chest pain and palpitations.  Gastrointestinal:  Negative for abdominal pain, nausea and vomiting.  Genitourinary:  Negative for dysuria and flank pain.       Report  by daughter of incontinence   Musculoskeletal:  Negative for back pain, joint pain and myalgias.  Skin: Negative.   Neurological:  Positive for speech change and weakness. Negative for dizziness and headaches.       Report by daughter of right sided weakness  Psychiatric/Behavioral: Negative.      Past Medical History:  Diagnosis Date   Adenoma 05/2008   Alcoholism in recovery [facility])    BPH (benign prostatic hyperplasia)    Cancer [facility])    Prostate   Chronic LBP    Hip & Back -- Sees Dr. Maia Petties @ [place]   Colon polyps    Complex renal cyst 12/2010   Controlled type 2 diabetes mellitus with neuropathy ([place]) 01/2007   COPD (chronic obstructive pulmonary disease) ([place])    Diabetes ([place])    Diverticulitis of colon    ED (erectile dysfunction)    s/p Penile prosthesis (09/2010)   History of prostate cancer    Dr. Karsten Ro   History of sick sinus syndrome    reduced BB dose for Bradycardia   HLD (hyperlipidemia)    Hypertension    Impaired glucose tolerance 12/21/2013   Nephrolithiasis    Osteoarthritis of both hips    Osteoarthritis of lumbosacral spine    with Disc Disease   PAF (paroxysmal atrial fibrillation) (HCC)    No longer on Amiodarone.  Not on Anticoaguation b/c no recurrence.   Paresthesias/numbness    Bilateral LE   Peripheral neuropathy 12/21/2013  Past Surgical History:  Procedure Laterality Date   LEFT HEART CATH AND CORONARY ANGIOGRAPHY  2003   Normal Coronary Arteries.   NM MYOVIEW LTD  02/21/2020   EF 60%.  Medium sized mild severity defect in the basal inferior, mid inferior and apical inferior location-suggesting of ischemia.  Read as low-intermediate risk.  (On cardiology review this is felt to be a fixed defect either related to prior infarct versus diaphragmatic attenuation.  Felt to be low risk)   PENILE PROSTHESIS IMPLANT  06/21/2012   Procedure: PENILE PROTHESIS INFLATABLE;  Surgeon: Claybon Jabs, MD;  Location: [facility];  Service: Urology;  Laterality: N/A;  REMOVAL AND REPLACEMENT OF SOME  OF PROSTHESIS ([facility])    PENILE PROSTHESIS PLACEMENT  09/2010   PROSTATECTOMY     REMOVAL OF PENILE PROSTHESIS N/A 02/15/2018   Procedure: REMOVAL OF PENILE PROSTHESIS;  Surgeon: Kathie Rhodes, MD;  Location: [facility];  Service: Urology;  Laterality: N/A;   TRANSTHORACIC ECHOCARDIOGRAM  04/2016   a) Relatively normal EF of 55-60%. No RWMA suggesting no prior MI. Gr 2 DD (pseudo-normal filling pattern) along with moderately dilated left atrium.;; b) 02/2018: Mod LVH. EF 65-70%. No RWMA.  Unable to assess DF 2/2 Afib. Mild MR. Severe BiAtrial Enlargement. High CVP.   TRANSTHORACIC ECHOCARDIOGRAM  02/17/2020   EF 60 to 65%.  No R WMA.  Unable to assess diastolic parameters because of A. fib.  Mildly elevated PA pressures.  Mild LA dilation.  Mild aortic valve sclerosis but no stenosis.  Normal IVC.    Soc Hx - patient's daughter lives with him. He has two daughters, 1 son and grandchildren. He is retired. Manages self-care at home.    reports that he quit smoking about 39 years ago. His smoking use included cigarettes. He has never used smokeless tobacco. He reports current alcohol use. He reports that he does not use drugs.  Allergies  Allergen Reactions   Ciprofloxacin Other (See Comments)    All over weakness    Family History  Problem Relation Age of Onset   Coronary artery disease Mother    Heart attack Mother    Coronary artery disease Father    Prostate cancer Father    Diabetes Father     Prior to Admission medications   Medication Sig Start Date End Date Taking? Authorizing Provider  acetaminophen (TYLENOL) 325 MG tablet Take 2 tablets (650 mg total) by mouth every 6 (six) hours as needed for mild pain or moderate pain. 02/23/20   Domenic Polite, MD  amLODipine (NORVASC) 10 MG tablet TAKE 1 TABLET BY MOUTH  DAILY 09/23/21   Leonie Man, MD  ELIQUIS 5 MG TABS tablet TAKE 1 TABLET BY MOUTH  TWICE DAILY  01/19/22   Biagio Borg, MD  furosemide (LASIX) 40 MG tablet TAKE 1 TABLET BY MOUTH  DAILY MAY TAKE AN  ADDITIONAL 1 TABLET IF  NEEDED FOR SWELLING 09/27/21   Leonie Man, MD  Lancet Devices [facility] PLUS LANCING) [place]  09/19/20   [provider]  Lancets (ONETOUCH DELICA PLUS [id number]) [place] USE ONCE DAILY 10/29/20   Biagio Borg, MD  loratadine (CLARITIN) 10 MG tablet Take 10 mg by mouth daily as needed for allergies.    [provider]  metoprolol tartrate (LOPRESSOR) 25 MG tablet Take 0.5 tablets (12.5 mg total) by mouth 2 (two) times daily. 11/18/21   Biagio Borg, MD  polyethylene glycol (MIRALAX / GLYCOLAX) 17 g  packet Take 17 g by mouth daily as needed for moderate constipation. 02/23/20   Domenic Polite, MD  potassium chloride (KLOR-CON) 10 MEQ tablet Take 1 tablet (10 mEq total) by mouth daily. 12/04/21   Biagio Borg, MD  pravastatin (PRAVACHOL) 40 MG tablet Take 1 tablet (40 mg total) by mouth daily. Please make an appointment with cardiologist for further refills 12/09/21   Leonie Man, MD  [facility] HFA [phone] Base) MCG/ACT inhaler INHALE 2 PUFFS INTO THE  LUNGS EVERY 6 HOURS AS  NEEDED Patient taking differently: Inhale 2 puffs into the lungs every 6 (six) hours as needed for shortness of breath or wheezing. 06/01/20   Biagio Borg, MD    Physical Exam: Vitals:   01/24/22 1645 01/24/22 1830 01/24/22 1845 01/24/22 1904  BP: (!) 158/126 (!) 158/69 (!) 168/97   Pulse:  74 72   Resp: (!) 22 (!) 32 (!) 29   Temp:    99.4 F (37.4 C)  TempSrc:    Oral  SpO2:  93% 93%     Physical Exam Vitals and nursing note reviewed.  Constitutional:      General: He is not in acute distress.    Appearance: He is obese.     Comments: Patient somnolent - had received 1 mg ativan IV within the last hour. Was able to answer questions.  HENT:     Head: Normocephalic and atraumatic.     Nose: Nose normal.     Mouth/Throat:     Mouth: Mucous membranes are dry.      Pharynx: No oropharyngeal exudate.     Comments: edentulous Eyes:     Extraocular Movements: Extraocular movements intact.     Pupils: Pupils are equal, round, and reactive to light.     Comments: Muddy sclera  Neck:     Vascular: No carotid bruit.  Cardiovascular:     Rate and Rhythm: Normal rate. Rhythm irregular.     Comments: Trace pedal pulse, 2+ radial pulse Pulmonary:     Effort: Pulmonary effort is normal. No respiratory distress.     Breath sounds: Normal breath sounds. No wheezing or rales.     Comments: Patient with frequent cough - nonproductive Abdominal:     General: Bowel sounds are normal.     Palpations: Abdomen is soft.     Tenderness: There is no abdominal tenderness. There is no guarding or rebound.  Musculoskeletal:     Cervical back: No rigidity.     Right lower leg: Edema present.     Left lower leg: Edema present.  Lymphadenopathy:     Cervical: No cervical adenopathy.  Skin:    General: Skin is warm.     Comments: Thickened skin LE, scaling skin, no open lesions. Mycotic, long deformed toe nails. Plantar aspect feet w/o lesions.   Neurological:     Mental Status: He is oriented to person, place, and time. He is lethargic.     Cranial Nerves: No cranial nerve deficit or facial asymmetry.     Sensory: Sensation is intact.     Motor: Motor function is intact. No weakness or tremor.     Comments: Patient lethargic after ativan. See Dr. Clydene Fake more complete neuro exam-no focal deficits.   Psychiatric:        Mood and Affect: Mood and affect normal.        Speech: Speech is delayed.        Behavior: Behavior is slowed. Behavior is  cooperative.      Labs on Admission: I have personally reviewed following labs and imaging studies  CBC: Recent Labs  Lab 01/24/22 1300  WBC 6.8  NEUTROABS 4.8  HGB 13.3  HCT 42.5  MCV 86.4  PLT 371   Basic Metabolic Panel: Recent Labs  Lab 01/24/22 1440  NA 135  K 4.3  CL 102  CO2 20*  GLUCOSE 106*  BUN 20   CREATININE 1.79*  CALCIUM 8.9   GFR: CrCl cannot be calculated (Unknown ideal weight.). Liver Function Tests: Recent Labs  Lab 01/24/22 1440  AST 28  ALT 12  ALKPHOS 132*  BILITOT 0.7  PROT 8.8*  ALBUMIN 2.6*   No results for input(s): "LIPASE", "AMYLASE" in the last 168 hours. Recent Labs  Lab 01/24/22 1440  AMMONIA 21   Coagulation Profile: Recent Labs  Lab 01/24/22 1433  INR 1.2   Cardiac Enzymes: No results for input(s): "CKTOTAL", "CKMB", "CKMBINDEX", "TROPONINI" in the last 168 hours. BNP (last 3 results) No results for input(s): "PROBNP" in the last 8760 hours. HbA1C: No results for input(s): "HGBA1C" in the last 72 hours. CBG: Recent Labs  Lab 01/24/22 1225  GLUCAP 126*   Lipid Profile: No results for input(s): "CHOL", "HDL", "LDLCALC", "TRIG", "CHOLHDL", "LDLDIRECT" in the last 72 hours. Thyroid Function Tests: Recent Labs    01/24/22 1249  TSH 1.363   Anemia Panel: No results for input(s): "VITAMINB12", "FOLATE", "FERRITIN", "TIBC", "IRON", "RETICCTPCT" in the last 72 hours. Urine analysis:    Component Value Date/Time   COLORURINE AMBER (A) 01/24/2022 1547   APPEARANCEUR CLOUDY (A) 01/24/2022 1547   LABSPEC 1.024 01/24/2022 1547   PHURINE 6.0 01/24/2022 1547   GLUCOSEU NEGATIVE 01/24/2022 1547   GLUCOSEU NEGATIVE 11/18/2021 1227   HGBUR SMALL (A) 01/24/2022 1547   BILIRUBINUR NEGATIVE 01/24/2022 1547   KETONESUR NEGATIVE 01/24/2022 1547   PROTEINUR >=300 (A) 01/24/2022 1547   UROBILINOGEN 0.2 11/18/2021 1227   NITRITE NEGATIVE 01/24/2022 1547   LEUKOCYTESUR TRACE (A) 01/24/2022 1547    Radiological Exams on Admission: I have personally reviewed images DG Chest Portable 1 View  Result Date: 01/24/2022 CLINICAL DATA:  Altered mental status.  Cough. EXAM: PORTABLE CHEST 1 VIEW COMPARISON:  10/22/2020 FINDINGS: 1310 hours. Low volume film. The cardio pericardial silhouette is enlarged. The lungs are clear without focal pneumonia, edema,  pneumothorax or pleural effusion. Bones are diffusely demineralized. Telemetry leads overlie the chest. IMPRESSION: Low volume film without acute cardiopulmonary findings. Electronically Signed   By: Misty Stanley M.D.   On: 01/24/2022 13:25   CT HEAD CODE STROKE WO CONTRAST  Result Date: 01/24/2022 CLINICAL DATA:  Code stroke. Provided history: Neuro deficit, acute, stroke suspected. EXAM: CT HEAD WITHOUT CONTRAST TECHNIQUE: Contiguous axial images were obtained from the base of the skull through the vertex without intravenous contrast. RADIATION DOSE REDUCTION: This exam was performed according to the departmental dose-optimization program which includes automated exposure control, adjustment of the mA and/or kV according to patient size and/or use of iterative reconstruction technique. COMPARISON:  Prior head CT examinations 10/22/2020 and earlier. FINDINGS: Significantly motion degraded examination, limiting evaluation. Additionally, portions of the anteroinferior frontal lobes are excluded from the field of view (left greater than right). Brain: Moderate cerebral atrophy. Comparatively mild cerebellar atrophy. Moderate patchy and ill-defined hypoattenuation within the cerebral white matter, nonspecific but compatible with chronic small vessel disease. Known chronic cortical/subcortical left occipital lobe infarct (PCA vascular territory). Within the limitations of significant motion degradation, no definite intracranial  hemorrhage or acute demarcated cortical infarct is identified. No appreciable intracranial mass or extra-axial fluid collection. No midline shift. Vascular: No hyperdense vessel. Atherosclerotic calcifications. Skull: No fracture or aggressive osseous lesion. Sinuses/Orbits: The orbits and the majority of the paranasal sinuses are excluded from the field of view. No appreciable significant paranasal sinus disease. ASPECTS ([place] Stroke Program Early CT Score) - Ganglionic level infarction  (caudate, lentiform nuclei, internal capsule, insula, M1-M3 cortex): 7 - Supraganglionic infarction (M4-M6 cortex): 3 Total score (0-10 with 10 being normal): 10 Impressions #1 and #2 communicated to Dr. Leonie Man at 12:49 pmon 01/24/2022 by text page via the [facility] messaging system. IMPRESSION: 1. Examination significantly limited by motion degradation. Additionally, portions of the anteroinferior frontal lobes are excluded from the field of view (left greater than right). 2. Within this limitation, no definite acute intracranial abnormality is identified. 3. Known chronic left occipital lobe PCA territory infarct. 4. Moderate chronic small ischemic changes within the cerebral white matter. 5. Moderate cerebral atrophy. 6. Comparatively mild cerebellar atrophy. Electronically Signed   By: Kellie Simmering D.O.   On: 01/24/2022 12:52    EKG: I have personally reviewed EKG: a. Fib at 85, LVH, old anterior injury, question of inferior injury  Assessment/Plan Principal Problem:   CVA (cerebral vascular accident) ([place]) Active Problems:   Fever   Cough   Elevated troponin   Diabetes mellitus with neuropathy ([place])   Hyperlipidemia with target LDL less than 100   Hypertensive heart disease with chronic diastolic congestive heart failure ([place])   Permanent atrial fibrillation ([place]) - now off amiodarone; asymptomatic. CHA2DS2-VASc Score -5 (on Xarelto)   Venous insufficiency (chronic) (peripheral) with chronic lower extremity edema   HTN (hypertension)    Assessment and Plan: * CVA (cerebral vascular accident) [facility]) Patient with multiple risk factors including DM, HTN,HLD, obesity. Symptoms were transient. Neurology exam per Dr. Leonie Man was non-focal. Patient on Eliquis  Plan Continue Eliquis  Continue risk factor modification  MRI brain, MRA brain and neck  Echo  PT/OT/SLP evaluation  Fever Patient had fever to 101.2 at admission. CBCD nl. CXR w/o active disease. U/A mildly positive with bacteria but only  6-10 WBC/hpf. Possibly aspirated as source of fever. Given 1 g rocephin in ED   Plan Continue rocephin while workup in progress.  F/u CXR in AM  Elevated troponin Patient with multiple cardiac risk factors, DM, HTN< HLD, obesity, h/o CVA. Troponin mildly elevated to 94. EKG w/o definite acute changes but question of inferior injury.  Plan Cycle troponins  EKG in AM or as needed if he develops cardiac symptoms.   Cough Patient reports cough at home but no sputum production, no SOB, no wheezing. His coughing prevented MRI from being performed. Question of aspiration.  Plan Hycodan cough syrup as needed.  SLP evaluation  HTN (hypertension) BP elevated to 158/126 at admission but improved to 150's/100's. At this point will be permissive re: control  Plan Continue home meds  Hydralazine for SBP > 180  Venous insufficiency (chronic) (peripheral) with chronic lower extremity edema Patient with chronic venous stasis dermatitis w/o open lesions. Also mycotic nails. No need for additional therapy at this time.   Permanent atrial fibrillation (HCC) - now off amiodarone; asymptomatic. CHA2DS2-VASc Score -5 (on Xarelto) Stable rate.  Plan Continue home meds  Continue Eliquis  Hypertensive heart disease with chronic diastolic congestive heart failure ([place]) Patient appears well compensated  Plan Continue home regimen  2D echo  Hyperlipidemia with target LDL less than 100  Continue home meds. Lipid panel in AM  Diabetes mellitus with neuropathy (HCC) Last A1C 2 months ago 6.6%. No antiglycemic meds listed  Plan Low carb diet  Ss coverage       DVT prophylaxis: Eliquis Code Status: Full Code Family Communication: spoke with daughter [name]  Disposition Plan: home when stable  Consults called: Neuro - Dr. Leonie Man  Admission status: Observation, Telemetry bed   Adella Hare, MD [facility] 01/24/2022, 7:10 PM

## 2022-01-24 NOTE — ED Notes (Signed)
Lab results reported to Nurse. 

## 2022-01-24 NOTE — Assessment & Plan Note (Signed)
BP elevated to 158/126 at admission but improved to 150's/100's. At this point will be permissive re: control  Plan Continue home meds  Hydralazine for SBP > 180

## 2022-01-24 NOTE — Assessment & Plan Note (Addendum)
Last A1C 2 months ago 6.6%. No antiglycemic meds listed  Plan Low carb diet  Ss coverage

## 2022-01-25 ENCOUNTER — Observation Stay (HOSPITAL_COMMUNITY): Payer: Medicare Other

## 2022-01-25 DIAGNOSIS — R059 Cough, unspecified: Secondary | ICD-10-CM | POA: Diagnosis not present

## 2022-01-25 DIAGNOSIS — I639 Cerebral infarction, unspecified: Secondary | ICD-10-CM | POA: Diagnosis not present

## 2022-01-25 DIAGNOSIS — I10 Essential (primary) hypertension: Secondary | ICD-10-CM

## 2022-01-25 DIAGNOSIS — I5032 Chronic diastolic (congestive) heart failure: Secondary | ICD-10-CM

## 2022-01-25 DIAGNOSIS — N1832 Chronic kidney disease, stage 3b: Secondary | ICD-10-CM

## 2022-01-25 DIAGNOSIS — I110 Hypertensive heart disease with heart failure: Secondary | ICD-10-CM

## 2022-01-25 DIAGNOSIS — I6389 Other cerebral infarction: Secondary | ICD-10-CM

## 2022-01-25 DIAGNOSIS — I4821 Permanent atrial fibrillation: Secondary | ICD-10-CM

## 2022-01-25 DIAGNOSIS — I6501 Occlusion and stenosis of right vertebral artery: Secondary | ICD-10-CM | POA: Diagnosis not present

## 2022-01-25 DIAGNOSIS — E1140 Type 2 diabetes mellitus with diabetic neuropathy, unspecified: Secondary | ICD-10-CM | POA: Diagnosis not present

## 2022-01-25 DIAGNOSIS — G9341 Metabolic encephalopathy: Secondary | ICD-10-CM | POA: Diagnosis not present

## 2022-01-25 DIAGNOSIS — I6523 Occlusion and stenosis of bilateral carotid arteries: Secondary | ICD-10-CM | POA: Diagnosis not present

## 2022-01-25 LAB — TROPONIN I (HIGH SENSITIVITY): Troponin I (High Sensitivity): 152 ng/L (ref <18)

## 2022-01-25 LAB — ECHOCARDIOGRAM COMPLETE
Calc EF: 58 %
Height: 74 in
S' Lateral: 2.4 cm
Single Plane A2C EF: 60.2 %
Single Plane A4C EF: 57.9 %
Weight: 2366.86 oz

## 2022-01-25 LAB — LIPID PANEL
Cholesterol: 145 mg/dL (ref 0–200)
HDL: 39 mg/dL — ABNORMAL LOW (ref >40)
LDL Cholesterol: 94 mg/dL (ref 0–99)
Total CHOL/HDL Ratio: 3.7 RATIO
Triglycerides: 61 mg/dL (ref <150)
VLDL: 12 mg/dL (ref 0–40)

## 2022-01-25 LAB — GLUCOSE, CAPILLARY
Glucose-Capillary: 100 mg/dL — ABNORMAL HIGH (ref 70–99)
Glucose-Capillary: 98 mg/dL (ref 70–99)
Glucose-Capillary: 90 mg/dL (ref 70–99)
Glucose-Capillary: 92 mg/dL (ref 70–99)

## 2022-01-25 LAB — MAGNESIUM: Magnesium: 1.5 mg/dL — ABNORMAL LOW (ref 1.7–2.4)

## 2022-01-25 MED ORDER — MOLNUPIRAVIR EUA 200MG CAPSULE
4.0000 | ORAL_CAPSULE | Freq: Two times a day (BID) | ORAL | Status: AC
  Administered 2022-01-25 – 2022-01-29 (×9): 800 mg via ORAL
  Filled 2022-01-25 (×3): qty 4

## 2022-01-25 MED ORDER — IOHEXOL 350 MG/ML SOLN
100.0000 mL | Freq: Once | INTRAVENOUS | Status: AC | PRN
  Administered 2022-01-25: 100 mL via INTRAVENOUS

## 2022-01-25 MED ORDER — ROSUVASTATIN CALCIUM 20 MG PO TABS
20.0000 mg | ORAL_TABLET | Freq: Every day | ORAL | Status: DC
  Administered 2022-01-25 – 2022-01-30 (×6): 20 mg via ORAL
  Filled 2022-01-25 (×6): qty 1

## 2022-01-25 MED ORDER — MAGNESIUM SULFATE 2 GM/50ML IV SOLN
2.0000 g | Freq: Once | INTRAVENOUS | Status: DC
Start: 2022-01-25 — End: 2022-01-25

## 2022-01-25 MED ORDER — MAGNESIUM SULFATE 2 GM/50ML IV SOLN
2.0000 g | Freq: Once | INTRAVENOUS | Status: AC
  Administered 2022-01-25: 2 g via INTRAVENOUS
  Filled 2022-01-25: qty 50

## 2022-01-25 MED ORDER — LORAZEPAM 2 MG/ML IJ SOLN
1.0000 mg | Freq: Once | INTRAMUSCULAR | Status: AC
Start: 2022-01-25 — End: 2022-01-25
  Administered 2022-01-25: 1 mg via INTRAVENOUS
  Filled 2022-01-25: qty 1

## 2022-01-25 MED ORDER — GUAIFENESIN 100 MG/5ML PO LIQD
5.0000 mL | ORAL | Status: DC | PRN
Start: 2022-01-25 — End: 2022-01-30
  Administered 2022-01-25 – 2022-01-30 (×10): 5 mL via ORAL
  Filled 2022-01-25 (×10): qty 5

## 2022-01-25 MED ORDER — NIRMATRELVIR/RITONAVIR (PAXLOVID) TABLET (RENAL DOSING): 2.0000 | ORAL_TABLET | Freq: Two times a day (BID) | ORAL | Status: DC

## 2022-01-25 NOTE — Evaluation (Deleted)
SLP Cancellation Note  Patient Details Name: [name] MRN: [id number] DOB: [birth year]   Cancelled treatment:       Reason Eval/Treat Not Completed: Other (comment);Medical issues which prohibited therapy (per neurology note, pt's mental status concerning for delirium/sundowning rather than progression of CVA; will hold on SLE until reversible cog changes resolve)  If need evaluation before then, please secure chat this SLP. Thanks.   Kathleen Lime, MS [facility] SLP Acute Rehab Services Office [phone] Pager [phone]   Macario Golds 01/25/2022, 10:24 AM

## 2022-01-25 NOTE — Progress Notes (Signed)
Pt with waxing/waning mental status, prompting change in NIH, thinks he is at home. Suspect sundowning/delirium rather than stroke progression. CT/CTA without LVO. Will continue to monitor.   Roland Rack, MD [facility] [phone]  If 7pm- 7am, please page neurology on call as listed in [place].

## 2022-01-25 NOTE — Evaluation (Signed)
Speech Language Pathology Evaluation Patient Details Name: [name] MRN: [id number] DOB: 1946-01-12 Today's Date: 01/25/2022 Time: 1110-1130 SLP Time Calculation (min) (ACUTE ONLY): 20 min  Problem List:  Patient Active Problem List   Diagnosis Date Noted   CVA (cerebral vascular accident) ([place]) 01/24/2022   Cough 01/24/2022   Elevated troponin 01/24/2022   Fever 01/24/2022   Bradycardia 11/21/2021   Vitamin D deficiency 11/21/2021   Other fatigue 11/18/2021   Bilateral leg edema 07/08/2021   Gait disorder 12/16/2020   HTN (hypertension) 12/16/2020   Aortic atherosclerosis ([place]) 12/12/2020   Diverticulitis 10/23/2020   Fall 10/22/2020   Sepsis ([place]) 10/22/2020   Acute lower UTI 10/22/2020   NSVT (nonsustained ventricular tachycardia) ([place])    Multiple myeloma ([place])    AKI (acute kidney injury) ([place]) 02/15/2020   General weakness    Diabetic leg ulcer ([place]) 10/06/2019   CKD (chronic kidney disease) stage 3, GFR 30-59 ml/min ([place]) 10/06/2019   Diabetic ulcer of right lower leg ([place]) 02/23/2019   Abnormal LFTs 04/27/2018   Open wound of scrotum 03/01/2018   Acute on chronic diastolic CHF (congestive heart failure) ([place]) 02/25/2018   Postoperative anemia due to acute blood loss 02/25/2018   Occipital stroke ([place]), left 09/23/2017   Diabetes mellitus type II, non insulin dependent ([place])    Encounter for well adult exam with abnormal findings 07/31/2017   Venous insufficiency (chronic) (peripheral) with chronic lower extremity edema 04/04/2016   Junctional tachycardia ([place]) 03/18/2015   Exertional dyspnea 07/05/2014   Obesity (BMI 30-39.9) 05/04/2014   Benign essential tremor 05/04/2014   Peripheral neuropathy 12/21/2013   Nonspecific abnormal finding in stool contents 09/06/2012   Heme positive stool 08/09/2012   Difficulty passing stool 08/09/2012   Sebaceous cyst 04/15/2012   Complex renal cyst 01/13/2011   Back pain 01/02/2011   Permanent atrial fibrillation  ([place]) - now off amiodarone; asymptomatic. CHA2DS2-VASc Score -5 (on Xarelto) 01/02/2011   KNEE PAIN, LEFT 08/27/2009   COLONIC POLYPS, HX OF 08/08/2008   ERECTILE DYSFUNCTION 09/27/2007   ULNAR NEUROPATHY 09/27/2007   VENTRICULAR HYPERTROPHY, LEFT 09/27/2007   ALLERGIC RHINITIS 09/27/2007   Arthropathy 09/27/2007   GANGLION CYST, WRIST, LEFT 09/27/2007   HEMORRHOIDS, INTERNAL 06/10/2007   DIVERTICULOSIS, COLON 06/10/2007   DISC DISEASE, LUMBAR 06/03/2007   NEPHROLITHIASIS, HX OF 06/03/2007   Diabetes mellitus with neuropathy ([place]) 02/11/2007   Hyperlipidemia with target LDL less than 100 02/11/2007   ANXIETY 02/11/2007   Nondependent Alcohol Abuse, in Remission 02/11/2007   Hypertensive heart disease with chronic diastolic congestive heart failure ([place]) 02/11/2007   COPD (chronic obstructive pulmonary disease) ([place]) 02/11/2007   BENIGN PROSTATIC HYPERTROPHY 02/11/2007   LOW BACK PAIN 02/11/2007   Disturbance of skin sensation 02/11/2007   PROSTATE CANCER, HX OF 02/11/2007   Past Medical History:  Past Medical History:  Diagnosis Date   Adenoma 05/2008   Alcoholism in recovery [facility])    BPH (benign prostatic hyperplasia)    Cancer (HCC)    Prostate   Chronic LBP    Hip & Back -- Sees Dr. Maia Petties @ [facility]   Colon polyps    Complex renal cyst 12/2010   Controlled type 2 diabetes mellitus with neuropathy ([place]) 01/2007   COPD (chronic obstructive pulmonary disease) ([place])    Diabetes ([place])    Diverticulitis of colon    ED (erectile dysfunction)    s/p Penile prosthesis (09/2010)   History of prostate cancer    Dr. Karsten Ro  History of sick sinus syndrome    reduced BB dose for Bradycardia   HLD (hyperlipidemia)    Hypertension    Impaired glucose tolerance 12/21/2013   Nephrolithiasis    Osteoarthritis of both hips    Osteoarthritis of lumbosacral spine    with Disc Disease   PAF (paroxysmal atrial fibrillation) (HCC)    No longer on Amiodarone.  Not on  Anticoaguation b/c no recurrence.   Paresthesias/numbness    Bilateral LE   Peripheral neuropathy 12/21/2013   Past Surgical History:  Past Surgical History:  Procedure Laterality Date   LEFT HEART CATH AND CORONARY ANGIOGRAPHY  2003   Normal Coronary Arteries.   NM MYOVIEW LTD  02/21/2020   EF 60%.  Medium sized mild severity defect in the basal inferior, mid inferior and apical inferior location-suggesting of ischemia.  Read as low-intermediate risk.  (On cardiology review this is felt to be a fixed defect either related to prior infarct versus diaphragmatic attenuation.  Felt to be low risk)   PENILE PROSTHESIS IMPLANT  06/21/2012   Procedure: PENILE PROTHESIS INFLATABLE;  Surgeon: Claybon Jabs, MD;  Location: [facility];  Service: Urology;  Laterality: N/A;  REMOVAL AND REPLACEMENT OF SOME  OF PROSTHESIS ([facility])    PENILE PROSTHESIS PLACEMENT  09/2010   PROSTATECTOMY     REMOVAL OF PENILE PROSTHESIS N/A 02/15/2018   Procedure: REMOVAL OF PENILE PROSTHESIS;  Surgeon: Kathie Rhodes, MD;  Location: [facility];  Service: Urology;  Laterality: N/A;   TRANSTHORACIC ECHOCARDIOGRAM  04/2016   a) Relatively normal EF of 55-60%. No RWMA suggesting no prior MI. Gr 2 DD (pseudo-normal filling pattern) along with moderately dilated left atrium.;; b) 02/2018: Mod LVH. EF 65-70%. No RWMA.  Unable to assess DF 2/2 Afib. Mild MR. Severe BiAtrial Enlargement. High CVP.   TRANSTHORACIC ECHOCARDIOGRAM  02/17/2020   EF 60 to 65%.  No R WMA.  Unable to assess diastolic parameters because of A. fib.  Mildly elevated PA pressures.  Mild LA dilation.  Mild aortic valve sclerosis but no stenosis.  Normal IVC.   HPI:  Per MD note "[name] is a 76 year old African-American male with prior history of diabetes, hypertension, hyperlipidemia, COPD, paroxysmal A-fib on anticoagulation with Eliquis and history of left occipital infarct 2 years ago.  Per his daughters report he wasn't himself 01/23/22, had a fall  and has had urinary incontinence. He was last seen at [facility] 01/23/22. This AM his daughter reports he was dysarthric and had right sided weakness."  Pt found to have left PCA occiptal lobe CVA, cerebellar atrophy - motion degraded exam.  On early morning of 7/8, pt with mental status changes - neuro suspected delirum or sundowning.  Pt resides with his daughter *who works.  Daughter reports to manage all home duties.  Speech eval ordered - daughter reports speech  improved but not baseline yet.   Assessment / Plan / Recommendation Clinical Impression  Limited exam due to ECHO staff arriving and unable to guarantee return - daughter wanted ECHO done.  Portion of [place] Mental Status* completed.  P's was oriented to year with verbal choice of 2, not oriented to current DOW with verbal choice of 2, able to complete functional math question with delay.  Recall of 5 objects impaired with pt recalling one independently and 2 with multiple choice cue.  His articulation is functional - daughter admits to improvement but not normal.  Pt noted during exam to have impaired  sustained attention - ? if due to fatigue and/or cognitive difficulties.  He benefited from verbal/tactile cues to return to task after closing his eyes.  Appearance of apnea present.  Will follow up for furhter diagnostic treatment as pt admits to changes with current event.    SLP Assessment  SLP Recommendation/Assessment: Patient needs continued Speech [place] [facility] SLP Visit Diagnosis: Cognitive communication deficit (R41.841)    Recommendations for follow up therapy are one component of a multi-disciplinary discharge planning process, led by the attending physician.  Recommendations may be updated based on patient status, additional functional criteria and insurance authorization.    Follow Up Recommendations  Skilled nursing-short term rehab (<3 hours/day)    Assistance Recommended at Discharge  Frequent  or constant Supervision/Assistance  Functional Status Assessment Patient has had a recent decline in their functional status and demonstrates the ability to make significant improvements in function in a reasonable and predictable amount of time.  Frequency and Duration min 1 x/week  1 week      SLP Evaluation Cognition  Overall Cognitive Status: Impaired/Different from baseline Arousal/Alertness:  (sleepy during session requiring several cues to stay alert/participte- ? some delayed responses vs mentation or both) Orientation Level: Oriented to person;Oriented to place;Oriented to situation;Disoriented to time Year: Other (Comment) (2014 stated but then he knew not correct, correct with verbal choice of 2) Day of Week: Incorrect (not correct with verbal choice of 2) Attention: Focused Focused Attention: Impaired Focused Attention Impairment: Verbal complex Memory: Impaired Memory Impairment: Storage deficit;Retrieval deficit (recalled one word of 5 independently, identified 2 words correct with multiple choice, did not correctly id 2 words) Problem Solving: Appears intact (for basic math) Safety/Judgment: Impaired       Comprehension  Auditory Comprehension Overall Auditory Comprehension: Appears within functional limits for tasks assessed Yes/No Questions: Not tested Commands: Within Functional Limits Conversation: Simple Interfering Components: Processing speed EffectiveTechniques: Extra processing time;Repetition Visual Recognition/Discrimination Discrimination: Not tested Reading Comprehension Reading Status: Not tested    Expression Expression Primary Mode of Expression: Verbal Verbal Expression Overall Verbal Expression: Appears within functional limits for tasks assessed Initiation: No impairment Level of Generative/Spontaneous Verbalization:  (dnt) Repetition:  (dNT) Pragmatics: No impairment Interfering Components: Attention Non-Verbal Means of Communication: Not  applicable Other Verbal Expression Comments: Pt answered SLP questions in single sentences with intact syntax and and semantics. Written Expression Dominant Hand: Right Written Expression: Not tested   Oral / Motor  Oral Motor/Sensory Function Overall Oral Motor/Sensory Function: Other (comment) (pt able to seal lips on straw) Motor Speech Overall Motor Speech: Impaired Respiration: Impaired Level of Impairment: Sentence Phonation: Normal Resonance: Within functional limits Articulation: Impaired Intelligibility: Intelligible Motor Planning: Witnin functional limits Motor Speech Errors: Not applicable            Macario Golds 01/25/2022, 11:57 AM Kathleen Lime, MS [facility] SLP Acute Rehab Services Office [phone] Pager [phone]

## 2022-01-25 NOTE — Evaluation (Signed)
Physical Therapy Evaluation Patient Details Name: [name] MRN: [id number] DOB: 01-May-1946 Today's Date: 01/25/2022  History of Present Illness  76 y/o male presented to ED on 01/24/22 as code stroke for L facial droop and slurred speech. CT head limited by motion but no definite acute abnormality. PMH: hx of L occipital PCA CVA, hx of prostate cancer, T2DM with neuropathy, COPD, Afib, CKD, HTN  Clinical Impression  Patient admitted with the above. PTA, patient lives with daughter and modI for limited mobility with RW and is alone majority of the day while daughter works. Patient presents with R>L weakness, impaired balance, decreased activity tolerance, and impaired cognition. Patient required max-totalA+2 for bed mobility and up to maxA to maintain sitting balance due to heavy R and posterior lean. Dyspnea with activity but unable to get good reading for spO2 due to poor pleth. Patient will benefit from skilled PT services during acute stay to address listed deficits. Recommend SNF at discharge to maximize functional mobility and decrease burden of care.       Recommendations for follow up therapy are one component of a multi-disciplinary discharge planning process, led by the attending physician.  Recommendations may be updated based on patient status, additional functional criteria and insurance authorization.  Follow Up Recommendations Skilled nursing-short term rehab (<3 hours/day) Can patient physically be transported by private vehicle: No    Assistance Recommended at Discharge Frequent or constant Supervision/Assistance  Patient can return home with the following  Two people to help with walking and/or transfers;A lot of help with bathing/dressing/bathroom    Equipment Recommendations Other (comment) (TBD)  Recommendations for Other Services       Functional Status Assessment Patient has had a recent decline in their functional status and/or demonstrates limited ability to  make significant improvements in function in a reasonable and predictable amount of time     Precautions / Restrictions Precautions Precautions: Fall Precaution Comments: Covid Restrictions Weight Bearing Restrictions: No      Mobility  Bed Mobility Overal bed mobility: Needs Assistance Bed Mobility: Supine to Sit, Sit to Supine     Supine to sit: Max assist, +2 for physical assistance Sit to supine: Total assist, +2 for physical assistance   General bed mobility comments: pt "falling postierorly". unable to return to bed independently safely as he was attempting to lie sideways on the bed; used bed pad to help mobilize back to bed    Transfers Overall transfer level: Needs assistance                 General transfer comment: unable; bed also not staying elevated; unable to safely attempt; uses a lift chair at baseline    Ambulation/Gait                  Stairs            Wheelchair Mobility    Modified Rankin (Stroke Patients Only)       Balance Overall balance assessment: Needs assistance   Sitting balance-Leahy Scale: Poor                                       Pertinent Vitals/Pain Pain Assessment Pain Assessment: Faces Faces Pain Scale: Hurts little more Pain Location: back Pain Descriptors / Indicators: Discomfort, Grimacing Pain Intervention(s): Limited activity within patient's tolerance    Home Living Family/patient expects to be discharged to:: Private residence Living  Arrangements: Children Available Help at Discharge: Family;Available PRN/intermittently (daughter works. 57 y/o granddaughter is home during the summer) Type of Home: House Home Access: Stairs to enter Entrance Stairs-Rails: Left Entrance Stairs-Number of Steps: 2   Home Layout: One level Home Equipment: Conservation officer, nature (2 wheels);Toilet riser;Shower seat;Hospital bed (Lift chair) Additional Comments: has hospital bed but too small so sleeps  in lift chair    Prior Function Prior Level of Function : Needs assist;History of Falls (last six months)       Physical Assist : ADLs (physical)   ADLs (physical): Bathing;Dressing Mobility Comments: ambulates to/from bathroom occasionally. uses urinal primarily. Reports 1 fall in past 6 months ADLs Comments: needs help with set up for bathing and dressing; sleeps in lift chair     Hand Dominance   Dominant Hand: Right    Extremity/Trunk Assessment   Upper Extremity Assessment Upper Extremity Assessment: Defer to OT evaluation RUE Deficits / Details: RUE appeasr weaker than L with decreased fine motor/coordination; able to use to pick up cup however unablet o bring cup completely to mouth to sip from straw RUE Coordination: decreased fine motor;decreased gross motor    Lower Extremity Assessment Lower Extremity Assessment: Generalized weakness (R>L)    Cervical / Trunk Assessment Cervical / Trunk Assessment: Kyphotic;Other exceptions (forward head; R/posterior bias)  Communication   Communication: No difficulties  Cognition Arousal/Alertness: Awake/alert Behavior During Therapy: Impulsive Overall Cognitive Status: Impaired/Different from baseline Area of Impairment: Orientation, Attention, Memory, Following commands, Safety/judgement, Awareness, Problem solving                 Orientation Level: Disoriented to, Time, Situation Current Attention Level: Sustained Memory: Decreased short-term memory Following Commands: Follows one step commands with increased time Safety/Judgement: Decreased awareness of safety, Decreased awareness of deficits Awareness: Intellectual Problem Solving: Slow processing, Decreased initiation, Difficulty sequencing, Requires verbal cues, Requires tactile cues General Comments: falling posterioroly and to the R and states this is "normal"; unable to stand and states that he is "normal"; dtr present and states cognition is impaired         General Comments General comments (skin integrity, edema, etc.): skin issues BLE    Exercises     Assessment/Plan    PT Assessment Patient needs continued PT services  PT Problem List Decreased strength;Decreased balance;Decreased activity tolerance;Decreased mobility;Decreased cognition;Decreased safety awareness       PT Treatment Interventions DME instruction;Gait training;Functional mobility training;Therapeutic activities;Therapeutic exercise;Balance training;Patient/family education    PT Goals (Current goals can be found in the Care Plan section)  Acute Rehab PT Goals Patient Stated Goal: did not state PT Goal Formulation: With patient/family Time For Goal Achievement: 02/08/22 Potential to Achieve Goals: Fair    Frequency Min 2X/week     Co-evaluation PT/OT/SLP Co-Evaluation/Treatment: Yes Reason for Co-Treatment: For patient/therapist safety;To address functional/ADL transfers PT goals addressed during session: Mobility/safety with mobility;Balance OT goals addressed during session: ADL's and self-care       AM-PAC PT "6 Clicks" Mobility  Outcome Measure Help needed turning from your back to your side while in a flat bed without using bedrails?: Total Help needed moving from lying on your back to sitting on the side of a flat bed without using bedrails?: Total Help needed moving to and from a bed to a chair (including a wheelchair)?: Total Help needed standing up from a chair using your arms (e.g., wheelchair or bedside chair)?: Total Help needed to walk in hospital room?: Total Help needed climbing 3-5 steps with a  railing? : Total 6 Click Score: 6    End of Session   Activity Tolerance: Patient tolerated treatment well Patient left: in bed;with call bell/phone within reach;with bed alarm set;with family/visitor present Nurse Communication: Mobility status;Need for lift equipment PT Visit Diagnosis: Unsteadiness on feet (R26.81);Muscle weakness  (generalized) (M62.81);Difficulty in walking, not elsewhere classified (R26.2)    Time: 2446-2863 PT Time Calculation (min) (ACUTE ONLY): 35 min   Charges:   PT Evaluation $PT Eval Moderate Complexity: 1 Mod          Lillias Difrancesco A. Gilford Rile PT, DPT Acute Rehabilitation Services Office [phone]   Linna Hoff 01/25/2022, 12:50 PM

## 2022-01-25 NOTE — Progress Notes (Signed)
Pt extremely kyphotic and having still experiencing coughing spells, even though meds were given. Pt arrived with head elevated, attempted to lay down flat and pt had difficulty breathing. Unable to do MRI due to pt condition.

## 2022-01-25 NOTE — Progress Notes (Addendum)
PROGRESS NOTE    [name]  QMG:[id number] DOB: 09-17-1945 DOA: 01/24/2022 PCP: No primary care provider on file.   Brief Narrative:  [name] is a 76 year old African-American male with prior history of diabetes, hypertension, hyperlipidemia, COPD, paroxysmal A-fib on anticoagulation with Eliquis and history of left occipital infarct 2 years ago.  Per his daughters report he wasn't himself 01/23/22, had a fall and has had urinary incontinence. He was last seen at [facility] 01/23/22. This AM his daughter reports he was dysarthric and had right sided weakness. EMS activated. His symptoms seemed to improve in transit t [facility].    ED Course: Code Stroke called by EMS in transit. Pt seen at the bridge by Dr. Leonie Man for neurology who reported that symptoms seemed to have cleared. He was probable outside the window for thrombolytics and symtoms were not c/w LVO event. Emergent CT head was negative. Vitals T 101.2  BP 158/106  HR 81  RR 22. Lab notable for glucose 106, Cr 1.79 (chronic CKD), CBCD nl, TSH nl, Troponin 94. CXR NAD, EKG w/ a. Fib at rate of 85, LVH, old anterior injury, question of recent inferior injury.    Neuro recommeds TRH admit to complete workup with MRI brain, MRA brain and neck, ECHO. In ED due to fever and abnl U/A 1G Rocephin administered. Appropriate cultures done.   Assessment & Plan:   Principal Problem:   CVA (cerebral vascular accident) ([place]) Active Problems:   Fever   Cough   Elevated troponin   Diabetes mellitus with neuropathy (HCC)   Hyperlipidemia with target LDL less than 100   Hypertensive heart disease with chronic diastolic congestive heart failure ([place])   Permanent atrial fibrillation ([place]) - now off amiodarone; asymptomatic. CHA2DS2-VASc Score -5 (on Xarelto)   Venous insufficiency (chronic) (peripheral) with chronic lower extremity edema   Chronic kidney disease, stage 3b (HCC)   HTN (hypertension)   Hypomagnesemia  Dysarthria/right-sided  weakness: Patient with multiple risk factors including DM, HTN,HLD, obesity. Symptoms were transient and improved by the time he arrived to the ED.  He was out of the window for tPA.  Admitted to hospital service.  Patient has no complaints and no focal deficit.  CT head negative.  MRI pending.  Echo completed.  PT OT evaluated patient and recommends SNF.  SLP on board as well.  Continue Eliquis.   Sepsis secondary to UTI, POA: Patient meets criteria for sepsis due to fever of 101.2 as well as tachypnea and UTI.  Continue Rocephin and follow culture.   Elevated troponin/demand ischemia: Troponin elevated but essentially flat, echo ruled out wall motion abnormality.  Cough/incidental COVID-19 positive: Patient has been having some cough.  He was tested positive for COVID-19.  Per daughter, patient has not had any exposure other than the family members and all the family numbers are tested negative.  Chest x-ray negative and he is not hypoxic.  Discussed with the daughter and will start on molnupiravir.   HTN (hypertension): Blood pressure elevated.  Allowing permissive hypertension until stroke is ruled out.  Hold home medications, as needed hydralazine.   Venous insufficiency (chronic) (peripheral) with chronic lower extremity edema/lymphedema Patient with chronic venous stasis dermatitis w/o open lesions. Also mycotic nails. No need for additional therapy at this time.    Permanent atrial fibrillation (HCC) - now off amiodarone; asymptomatic. CHA2DS2-VASc Score -5 (on Xarelto)   Hypertensive heart disease with chronic diastolic congestive heart failure ([place]) Patient appears well compensated.  Echo shows LVH consistent  with uncontrolled hypertension.  Hyperlipidemia: Continue pravastatin  Diabetes mellitus with neuropathy (HCC) Last A1C 2 months ago 6.6%. No antiglycemic meds listed.  Blood sugar controlled on SSI.   CKD stage IIIb: At baseline.  Hypomagnesemia: Replace.  DVT prophylaxis:     Code Status: Full Code  Family Communication: Daughter present at bedside.  Plan of care discussed with patient in length and he/she verbalized understanding and agreed with it.  Status is: Observation The patient will require care spanning > 2 midnights and should be moved to inpatient because: Needs MRI to rule out a stroke and PT OT recommend SNF.   Estimated body mass index is 18.99 kg/m as calculated from the following:   Height as of this encounter: '6\' 2"'$  (1.88 m).   Weight as of this encounter: 67.1 kg.  Pressure Injury 02/16/20 Sacrum Bilateral Stage 2 -  Partial thickness loss of dermis presenting as a shallow open injury with a red, pink wound bed without slough. (Active)  02/16/20 0400  Location: Sacrum  Location Orientation: Bilateral  Staging: Stage 2 -  Partial thickness loss of dermis presenting as a shallow open injury with a red, pink wound bed without slough.  Wound Description (Comments):   Present on Admission: Yes     Pressure Injury 02/16/20 Right Stage 2 -  Partial thickness loss of dermis presenting as a shallow open injury with a red, pink wound bed without slough. (Active)  02/16/20 2200  Location:   Location Orientation: Right  Staging: Stage 2 -  Partial thickness loss of dermis presenting as a shallow open injury with a red, pink wound bed without slough.  Wound Description (Comments):   Present on Admission: Yes     Pressure Injury 02/16/20 Buttocks Right;Lateral Stage 2 -  Partial thickness loss of dermis presenting as a shallow open injury with a red, pink wound bed without slough. (Active)  02/16/20 2200  Location: Buttocks  Location Orientation: Right;Lateral  Staging: Stage 2 -  Partial thickness loss of dermis presenting as a shallow open injury with a red, pink wound bed without slough.  Wound Description (Comments):   Present on Admission: Yes   Nutritional Assessment: Body mass index is 18.99 kg/m.Marland Kitchen Seen by dietician.  I agree with the  assessment and plan as outlined below: Nutrition Status:        . Skin Assessment: I have examined the patient's skin and I agree with the wound assessment as performed by the wound care RN as outlined below: Pressure Injury 02/16/20 Sacrum Bilateral Stage 2 -  Partial thickness loss of dermis presenting as a shallow open injury with a red, pink wound bed without slough. (Active)  02/16/20 0400  Location: Sacrum  Location Orientation: Bilateral  Staging: Stage 2 -  Partial thickness loss of dermis presenting as a shallow open injury with a red, pink wound bed without slough.  Wound Description (Comments):   Present on Admission: Yes     Pressure Injury 02/16/20 Right Stage 2 -  Partial thickness loss of dermis presenting as a shallow open injury with a red, pink wound bed without slough. (Active)  02/16/20 2200  Location:   Location Orientation: Right  Staging: Stage 2 -  Partial thickness loss of dermis presenting as a shallow open injury with a red, pink wound bed without slough.  Wound Description (Comments):   Present on Admission: Yes     Pressure Injury 02/16/20 Buttocks Right;Lateral Stage 2 -  Partial thickness loss of dermis presenting  as a shallow open injury with a red, pink wound bed without slough. (Active)  02/16/20 2200  Location: Buttocks  Location Orientation: Right;Lateral  Staging: Stage 2 -  Partial thickness loss of dermis presenting as a shallow open injury with a red, pink wound bed without slough.  Wound Description (Comments):   Present on Admission: Yes    Consultants:  Neurology  Procedures:  None  Antimicrobials:  Anti-infectives (From admission, onward)    Start     Dose/Rate Route Frequency Ordered Stop   01/25/22 1700  cefTRIAXone (ROCEPHIN) 1 g in sodium chloride 0.9 % 100 mL IVPB        1 g 200 mL/hr over 30 Minutes Intravenous Every 24 hours 01/24/22 1829     01/25/22 1415  molnupiravir EUA (LAGEVRIO) capsule 800 mg        4 capsule  Oral 2 times daily 01/25/22 1322 01/30/22 0959   01/25/22 1300  nirmatrelvir/ritonavir EUA (renal dosing) (PAXLOVID) 2 tablet  Status:  Discontinued        2 tablet Oral 2 times daily 01/25/22 1205 01/25/22 1321   01/24/22 1545  cefTRIAXone (ROCEPHIN) 1 g in sodium chloride 0.9 % 100 mL IVPB        1 g 200 mL/hr over 30 Minutes Intravenous  Once 01/24/22 1530 01/24/22 1717         Subjective: Patient seen and examined.  Daughter at the bedside.  Patient fully alert and oriented.  He has no complaints.  He believes that his daughter overreacted and is overly cautious and that is why she brought him to the emergency department.  He says that he has a lot of things wrong but not to the intensity that he needed to come to the hospital.  Objective: Vitals:   01/25/22 0252 01/25/22 0320 01/25/22 0456 01/25/22 0855  BP: (!) 180/71 (!) 163/84 (!) 187/67 (!) 174/78  Pulse: 81 77 79 67  Resp: 16 (!) 34 20 20  Temp: 99.4 F (37.4 C)   99.6 F (37.6 C)  TempSrc: Oral   Oral  SpO2: 95% 93% 92% 94%  Weight:      Height:        Intake/Output Summary (Last 24 hours) at 01/25/2022 1327 Last data filed at 01/25/2022 1100 Gross per 24 hour  Intake 926.18 ml  Output --  Net 926.18 ml   Filed Weights   01/24/22 2102  Weight: 67.1 kg    Examination:  General exam: Appears calm and comfortable  Respiratory system: Clear to auscultation. Respiratory effort normal. Cardiovascular system: S1 & S2 heard, RRR. No JVD, murmurs, rubs, gallops or clicks.  +2-3 pitting edema bilateral lower extremity. Gastrointestinal system: Abdomen is nondistended, soft and nontender. No organomegaly or masses felt. Normal bowel sounds heard. Central nervous system: Alert and oriented. No focal neurological deficits. Extremities: Symmetric 5 x 5 power. Skin: No rashes, lesions or ulcers Psychiatry: Judgement and insight appear normal. Mood & affect appropriate.    Data Reviewed: I have personally reviewed  following labs and imaging studies  CBC: Recent Labs  Lab 01/24/22 1300  WBC 6.8  NEUTROABS 4.8  HGB 13.3  HCT 42.5  MCV 86.4  PLT 956   Basic Metabolic Panel: Recent Labs  Lab 01/24/22 1440 01/25/22 0224  NA 135  --   K 4.3  --   CL 102  --   CO2 20*  --   GLUCOSE 106*  --   BUN 20  --  CREATININE 1.79*  --   CALCIUM 8.9  --   MG  --  1.5*   GFR: Estimated Creatinine Clearance: 33.8 mL/min (A) (by C-G formula based on SCr of 1.79 mg/dL (H)). Liver Function Tests: Recent Labs  Lab 01/24/22 1440  AST 28  ALT 12  ALKPHOS 132*  BILITOT 0.7  PROT 8.8*  ALBUMIN 2.6*   No results for input(s): "LIPASE", "AMYLASE" in the last 168 hours. Recent Labs  Lab 01/24/22 1440  AMMONIA 21   Coagulation Profile: Recent Labs  Lab 01/24/22 1433  INR 1.2   Cardiac Enzymes: No results for input(s): "CKTOTAL", "CKMB", "CKMBINDEX", "TROPONINI" in the last 168 hours. BNP (last 3 results) No results for input(s): "PROBNP" in the last 8760 hours. HbA1C: No results for input(s): "HGBA1C" in the last 72 hours. CBG: Recent Labs  Lab 01/24/22 1225 01/24/22 2155 01/25/22 0916 01/25/22 1239  GLUCAP 126* 100* 100* 98   Lipid Profile: Recent Labs    01/25/22 0224  CHOL 145  HDL 39*  LDLCALC 94  TRIG 61  CHOLHDL 3.7   Thyroid Function Tests: Recent Labs    01/24/22 1249  TSH 1.363   Anemia Panel: No results for input(s): "VITAMINB12", "FOLATE", "FERRITIN", "TIBC", "IRON", "RETICCTPCT" in the last 72 hours. Sepsis Labs: Recent Labs  Lab 01/24/22 1440 01/24/22 1825  LATICACIDVEN 3.0* 2.4*    Recent Results (from the past 240 hour(s))  Resp Panel by RT-PCR (Flu A&B, Covid) Anterior Nasal Swab     Status: Abnormal   Collection Time: 01/24/22 12:26 PM   Specimen: Anterior Nasal Swab  Result Value Ref Range Status   SARS Coronavirus 2 by RT PCR POSITIVE (A) NEGATIVE Final    Comment: (NOTE) SARS-CoV-2 target nucleic acids are DETECTED.  The SARS-CoV-2 RNA  is generally detectable in upper respiratory specimens during the acute phase of infection. Positive results are indicative of the presence of the identified virus, but do not rule out bacterial infection or co-infection with other pathogens not detected by the test. Clinical correlation with patient history and other diagnostic information is necessary to determine patient infection status. The expected result is Negative.  Fact Sheet for Patients: EntrepreneurPulse.com.au  Fact Sheet for Healthcare Providers: IncredibleEmployment.be  This test is not yet approved or cleared by the [place] FDA and  has been authorized for detection and/or diagnosis of SARS-CoV-2 by FDA under an Emergency Use Authorization (EUA).  This EUA will remain in effect (meaning this test can be used) for the duration of  the COVID-19 declaration under Section 564(b)(1) of the A ct, 21 U.S.C. section 360bbb-3(b)(1), unless the authorization is terminated or revoked sooner.     Influenza A by PCR NEGATIVE NEGATIVE Final   Influenza B by PCR NEGATIVE NEGATIVE Final    Comment: (NOTE) The Xpert Xpress SARS-CoV-2/FLU/RSV plus assay is intended as an aid in the diagnosis of influenza from Nasopharyngeal swab specimens and should not be used as a sole basis for treatment. Nasal washings and aspirates are unacceptable for Xpert Xpress SARS-CoV-2/FLU/RSV testing.  Fact Sheet for Patients: EntrepreneurPulse.com.au  Fact Sheet for Healthcare Providers: IncredibleEmployment.be  This test is not yet approved or cleared by the [place] FDA and has been authorized for detection and/or diagnosis of SARS-CoV-2 by FDA under an Emergency Use Authorization (EUA). This EUA will remain in effect (meaning this test can be used) for the duration of the COVID-19 declaration under Section 564(b)(1) of the Act, 21 U.S.C. section 360bbb-3(b)(1),  unless the authorization  is terminated or revoked.  Performed at [facility], [place] [address] [place], [place] [address]   Blood culture (routine x 2)     Status: None (Preliminary result)   Collection Time: 01/24/22  2:33 PM   Specimen: BLOOD RIGHT HAND  Result Value Ref Range Status   Specimen Description BLOOD RIGHT HAND  Final   Special Requests   Final    BOTTLES DRAWN AEROBIC AND ANAEROBIC Blood Culture results may not be optimal due to an inadequate volume of blood received in culture bottles   Culture   Final    NO GROWTH < 24 HOURS Performed at [facility], [place] [address], [place],  [address]    Report Status PENDING  Incomplete     Radiology Studies: ECHOCARDIOGRAM COMPLETE  Result Date: 01/25/2022    ECHOCARDIOGRAM REPORT   Patient Name:   [name] Date of Exam: 01/25/2022 Medical Rec #:  [id number]           Height:       74.0 in Accession #:    [id number]          Weight:       147.9 lb Date of Birth:  July 27, 1945           BSA:          1.912 m Patient Age:    21 years            BP:           174/78 mmHg Patient Gender: M                   HR:           68 bpm. Exam Location:  Inpatient Procedure: 2D Echo, Cardiac Doppler and Color Doppler Indications:    Stroke I63.9  History:        Patient has prior history of Echocardiogram examinations, most                 recent 02/17/2020. COPD, Arrythmias:Atrial Fibrillation; Risk                 Factors:Hypertension, Dyslipidemia, Diabetes and ETOH.  Sonographer:    Bernadene Person RDCS Referring Phys: [place]  1. Left ventricular ejection fraction, by estimation, is 70 to 75%. The left ventricle has hyperdynamic function. The left ventricle has no regional wall motion abnormalities. There is mild left ventricular hypertrophy. Left ventricular diastolic parameters are indeterminate.  2. Right ventricular systolic function is normal. The right ventricular size is mildly enlarged. There  is normal pulmonary artery systolic pressure. The estimated right ventricular systolic pressure is 59.9 mmHg.  3. Left atrial size was severely dilated.  4. Right atrial size was moderately dilated.  5. The mitral valve is normal in structure. No evidence of mitral valve regurgitation. No evidence of mitral stenosis.  6. The aortic valve is normal in structure. Aortic valve regurgitation is not visualized. No aortic stenosis is present.  7. Aortic dilatation noted. There is mild dilatation of the aortic root, measuring 42 mm. There is mild dilatation of the ascending aorta, measuring 41 mm.  8. The inferior vena cava is dilated in size with >50% respiratory variability, suggesting right atrial pressure of 8 mmHg. Comparison(s): Prior images reviewed side by side. Conclusion(s)/Recommendation(s): No intracardiac source of embolism detected on this transthoracic study. Consider a transesophageal echocardiogram to exclude cardiac source of embolism if clinically indicated. FINDINGS  Left Ventricle: Left ventricular ejection fraction, by estimation, is 70 to 75%. The left ventricle has hyperdynamic function. The left ventricle has no regional wall motion abnormalities. The left ventricular internal cavity size was normal in size. There is mild left ventricular hypertrophy. Left ventricular diastolic parameters are indeterminate. Right Ventricle: The right ventricular size is mildly enlarged. No increase in right ventricular wall thickness. Right ventricular systolic function is normal. There is normal pulmonary artery systolic pressure. The tricuspid regurgitant velocity is 1.54  m/s, and with an assumed right atrial pressure of 8 mmHg, the estimated right ventricular systolic pressure is 40.0 mmHg. Left Atrium: Left atrial size was severely dilated. Right Atrium: Right atrial size was moderately dilated. Pericardium: There is no evidence of pericardial effusion. Mitral Valve: The mitral valve is normal in structure. No  evidence of mitral valve regurgitation. No evidence of mitral valve stenosis. Tricuspid Valve: The tricuspid valve is normal in structure. Tricuspid valve regurgitation is trivial. No evidence of tricuspid stenosis. Aortic Valve: The aortic valve is normal in structure. Aortic valve regurgitation is not visualized. No aortic stenosis is present. Pulmonic Valve: The pulmonic valve was normal in structure. Pulmonic valve regurgitation is trivial. No evidence of pulmonic stenosis. Aorta: Aortic dilatation noted. There is mild dilatation of the aortic root, measuring 42 mm. There is mild dilatation of the ascending aorta, measuring 41 mm. Venous: The inferior vena cava is dilated in size with greater than 50% respiratory variability, suggesting right atrial pressure of 8 mmHg. IAS/Shunts: No atrial level shunt detected by color flow Doppler.  LEFT VENTRICLE PLAX 2D LVIDd:         4.10 cm LVIDs:         2.40 cm LV PW:         1.30 cm LV IVS:        1.40 cm LVOT diam:     2.30 cm LV SV:         70 LV SV Index:   37 LVOT Area:     4.15 cm  LV Volumes (MOD) LV vol d, MOD A2C: 82.7 ml LV vol d, MOD A4C: 83.2 ml LV vol s, MOD A2C: 32.9 ml LV vol s, MOD A4C: 35.0 ml LV SV MOD A2C:     49.8 ml LV SV MOD A4C:     83.2 ml LV SV MOD BP:      48.3 ml RIGHT VENTRICLE RV S prime:     7.93 cm/s TAPSE (M-mode): 1.9 cm LEFT ATRIUM              Index        RIGHT ATRIUM           Index LA diam:        4.50 cm  2.35 cm/m   RA Area:     28.60 cm LA Vol (A2C):   108.0 ml 56.49 ml/m  RA Volume:   91.00 ml  47.59 ml/m LA Vol (A4C):   121.0 ml 63.28 ml/m LA Biplane Vol: 120.0 ml 62.76 ml/m  AORTIC VALVE LVOT Vmax:   106.33 cm/s LVOT Vmean:  64.500 cm/s LVOT VTI:    0.168 m  AORTA Ao Root diam: 4.20 cm Ao Asc diam:  4.10 cm TRICUSPID VALVE TR Peak grad:   9.5 mmHg TR Vmax:        154.00 cm/s  SHUNTS Systemic VTI:  0.17 m Systemic Diam: 2.30 cm Candee Furbish MD Electronically signed by Candee Furbish MD Signature Date/Time: 01/25/2022/12:52:11  PM    Final    CT ANGIO HEAD NECK W WO CM W PERF  Result Date: 01/25/2022 CLINICAL DATA:  Neuro deficit with acute stroke suspected. EXAM: CT ANGIOGRAPHY HEAD AND NECK CT PERFUSION BRAIN TECHNIQUE: Multidetector CT imaging of the head and neck was performed using the standard protocol during bolus administration of intravenous contrast. Multiplanar CT image reconstructions and MIPs were obtained to evaluate the vascular anatomy. Carotid stenosis measurements (when applicable) are obtained utilizing NASCET criteria, using the distal internal carotid diameter as the denominator. Multiphase CT imaging of the brain was performed following IV bolus contrast injection. Subsequent parametric perfusion maps were calculated using RAPID software. RADIATION DOSE REDUCTION: This exam was performed according to the departmental dose-optimization program which includes automated exposure control, adjustment of the mA and/or kV according to patient size and/or use of iterative reconstruction technique. CONTRAST:  150m OMNIPAQUE IOHEXOL 350 MG/ML SOLN COMPARISON:  Head CT from yesterday FINDINGS: CT HEAD FINDINGS Brain: Chronic small vessel ischemia with confluent biparietal white matter low-density and remote left occipital cortex infarct. Generalized brain atrophy with ventriculomegaly. Vascular: See below Skull: No acute finding Sinuses: Large retention cyst in the right maxillary sinus. Orbits: Negative Review of the MIP images confirms the above findings CTA NECK FINDINGS Aortic arch: Partial coverage is negative for acute finding or dilatation. Three vessel branching. Right carotid system: Atheromatous calcification at the bifurcation. No stenosis or ulceration. ICA tortuosity. Left carotid system: Mild atheromatous plaque for age. ICA tortuosity. No stenosis or ulceration Vertebral arteries: No proximal subclavian stenosis. Atheromatous plaque at the right vertebral origin. The vertebral arteries are smoothly contoured  and widely patent when allowing for tortuosity accentuated by vertebral spurring. Skeleton: Advanced cervical spine degeneration with reversal of cervical lordosis. Other neck: No acute finding Upper chest: No acute finding Review of the MIP images confirms the above findings CTA HEAD FINDINGS Anterior circulation: Atheromatous calcification affecting the carotid siphons. Mild atheromatous irregularity of branch vessels. No branch occlusion, beading, or flow limiting stenosis. Negative for aneurysm. Posterior circulation: Vertebral and basilar tortuosity without stenosis. Fetal type right PCA. No branch occlusion, beading, or aneurysm. Venous sinuses: Unremarkable for the contrast phase Anatomic variants: As above CT Brain Perfusion Findings: Nondiagnostic due to motion artifact. No superimposed gross perfusion deficits. IMPRESSION: 1. No emergent finding. 2. Atherosclerosis without significant stenosis of major arteries in the head and neck. 3. Nondiagnostic CT perfusion due to motion. No evidence of superimposed large territory deficit. Electronically Signed   By: JJorje GuildM.D.   On: 01/25/2022 04:44   DG Chest Portable 1 View  Result Date: 01/24/2022 CLINICAL DATA:  Altered mental status.  Cough. EXAM: PORTABLE CHEST 1 VIEW COMPARISON:  10/22/2020 FINDINGS: 1310 hours. Low volume film. The cardio pericardial silhouette is enlarged. The lungs are clear without focal pneumonia, edema, pneumothorax or pleural effusion. Bones are diffusely demineralized. Telemetry leads overlie the chest. IMPRESSION: Low volume film without acute cardiopulmonary findings. Electronically Signed   By: EMisty StanleyM.D.   On: 01/24/2022 13:25   CT HEAD CODE STROKE WO CONTRAST  Result Date: 01/24/2022 CLINICAL DATA:  Code stroke. Provided history: Neuro deficit, acute, stroke suspected. EXAM: CT HEAD WITHOUT CONTRAST TECHNIQUE: Contiguous axial images were obtained from the base of the skull through the vertex without  intravenous contrast. RADIATION DOSE REDUCTION: This exam was performed according to the departmental dose-optimization program which includes automated exposure control, adjustment of the mA and/or kV according to patient size and/or use of iterative reconstruction  technique. COMPARISON:  Prior head CT examinations 10/22/2020 and earlier. FINDINGS: Significantly motion degraded examination, limiting evaluation. Additionally, portions of the anteroinferior frontal lobes are excluded from the field of view (left greater than right). Brain: Moderate cerebral atrophy. Comparatively mild cerebellar atrophy. Moderate patchy and ill-defined hypoattenuation within the cerebral white matter, nonspecific but compatible with chronic small vessel disease. Known chronic cortical/subcortical left occipital lobe infarct (PCA vascular territory). Within the limitations of significant motion degradation, no definite intracranial hemorrhage or acute demarcated cortical infarct is identified. No appreciable intracranial mass or extra-axial fluid collection. No midline shift. Vascular: No hyperdense vessel. Atherosclerotic calcifications. Skull: No fracture or aggressive osseous lesion. Sinuses/Orbits: The orbits and the majority of the paranasal sinuses are excluded from the field of view. No appreciable significant paranasal sinus disease. ASPECTS ([place] Stroke Program Early CT Score) - Ganglionic level infarction (caudate, lentiform nuclei, internal capsule, insula, M1-M3 cortex): 7 - Supraganglionic infarction (M4-M6 cortex): 3 Total score (0-10 with 10 being normal): 10 Impressions #1 and #2 communicated to Dr. Leonie Man at 12:49 pmon 01/24/2022 by text page via the [facility] messaging system. IMPRESSION: 1. Examination significantly limited by motion degradation. Additionally, portions of the anteroinferior frontal lobes are excluded from the field of view (left greater than right). 2. Within this limitation, no definite acute  intracranial abnormality is identified. 3. Known chronic left occipital lobe PCA territory infarct. 4. Moderate chronic small ischemic changes within the cerebral white matter. 5. Moderate cerebral atrophy. 6. Comparatively mild cerebellar atrophy. Electronically Signed   By: Kellie Simmering D.O.   On: 01/24/2022 12:52    Scheduled Meds:  apixaban  5 mg Oral BID   furosemide  40 mg Oral Daily   insulin aspart  0-20 Units Subcutaneous TID WC   LORazepam  1 mg Intravenous Once   metoprolol tartrate  12.5 mg Oral BID   molnupiravir EUA  4 capsule Oral BID   potassium chloride  10 mEq Oral Daily   pravastatin  40 mg Oral Daily   Continuous Infusions:  sodium chloride 75 mL/hr at 01/25/22 1243   cefTRIAXone (ROCEPHIN)  IV       LOS: 0 days   Darliss Cheney, MD [facility]  01/25/2022, 1:27 PM   *Please note that this is a verbal dictation therefore any spelling or grammatical errors are due to the "[employer]" system interpretation.  Please page via [place] and do not message via secure chat for urgent patient care matters. Secure chat can be used for non urgent patient care matters.  How to contact the [facility] Attending or Consulting provider [place] or covering provider during after hours [place], for this patient?  Check the care team in [facility] and look for a) attending/consulting [facility] provider listed and b) the [facility] team listed. Page or secure chat [phone]. Log into [email or web address] and use [place]'s universal password to access. If you do not have the password, please contact the hospital operator. Locate the [facility] provider you are looking for under [facility] and page to a number that you can be directly reached. If you still have difficulty reaching the provider, please page the [facility] (Director on Call) for the Hospitalists listed on amion for assistance.

## 2022-01-25 NOTE — Progress Notes (Signed)
EEG complete - results pending 

## 2022-01-25 NOTE — Progress Notes (Addendum)
STROKE TEAM PROGRESS NOTE   SUBJECTIVE (INTERVAL HISTORY) His RN is at the bedside. Pt reclining in bed, not able to lay flat due to coughing and SOB. He is quite lethargic after ativan for MRI attempt. However, he still not able to tolerate MRI. He was found to have positive COVID on PCR but not sure about exposure. Overnight, he had wax and waning mental status change, CT/CTA negative, EEG pending.    OBJECTIVE Temp:  [99.2 F (37.3 C)-99.6 F (37.6 C)] 99.6 F (37.6 C) (07/08 0855) Pulse Rate:  [66-88] 67 (07/08 0855) Cardiac Rhythm: Atrial fibrillation;Atrial flutter (07/08 0855) Resp:  [16-34] 20 (07/08 0855) BP: (145-187)/(67-97) 174/78 (07/08 0855) SpO2:  [92 %-100 %] 94 % (07/08 0855) Weight:  [67.1 kg] 67.1 kg (07/07 2102)  Recent Labs  Lab 01/24/22 1225 01/24/22 2155 01/25/22 0916 01/25/22 1239  GLUCAP 126* 100* 100* 98   Recent Labs  Lab 01/24/22 1440 01/25/22 0224  NA 135  --   K 4.3  --   CL 102  --   CO2 20*  --   GLUCOSE 106*  --   BUN 20  --   CREATININE 1.79*  --   CALCIUM 8.9  --   MG  --  1.5*   Recent Labs  Lab 01/24/22 1440  AST 28  ALT 12  ALKPHOS 132*  BILITOT 0.7  PROT 8.8*  ALBUMIN 2.6*   Recent Labs  Lab 01/24/22 1300  WBC 6.8  NEUTROABS 4.8  HGB 13.3  HCT 42.5  MCV 86.4  PLT 181   No results for input(s): "CKTOTAL", "CKMB", "CKMBINDEX", "TROPONINI" in the last 168 hours. Recent Labs    01/24/22 1433  LABPROT 15.3*  INR 1.2   Recent Labs    01/24/22 1547  COLORURINE AMBER*  LABSPEC 1.024  PHURINE 6.0  GLUCOSEU NEGATIVE  HGBUR SMALL*  BILIRUBINUR NEGATIVE  KETONESUR NEGATIVE  PROTEINUR >=300*  NITRITE NEGATIVE  LEUKOCYTESUR TRACE*       Component Value Date/Time   CHOL 145 01/25/2022 0224   CHOL 142 08/23/2020 1504   TRIG 61 01/25/2022 0224   HDL 39 (L) 01/25/2022 0224   HDL 41 08/23/2020 1504   CHOLHDL 3.7 01/25/2022 0224   VLDL 12 01/25/2022 0224   LDLCALC 94 01/25/2022 0224   LDLCALC 88 08/23/2020  1504   Lab Results  Component Value Date   HGBA1C 6.6 (H) 11/18/2021      Component Value Date/Time   LABOPIA NONE DETECTED 01/24/2022 1547   COCAINSCRNUR NONE DETECTED 01/24/2022 1547   LABBENZ NONE DETECTED 01/24/2022 1547   AMPHETMU NONE DETECTED 01/24/2022 1547   THCU NONE DETECTED 01/24/2022 1547   LABBARB NONE DETECTED 01/24/2022 1547    Recent Labs  Lab 01/24/22 1440  ETH <10    I have personally reviewed the radiological images below and agree with the radiology interpretations.  ECHOCARDIOGRAM COMPLETE  Result Date: 01/25/2022    ECHOCARDIOGRAM REPORT   Patient Name:   [name] Date of Exam: 01/25/2022 Medical Rec #:  [id number]           Height:       74.0 in Accession #:    [id number]          Weight:       147.9 lb Date of Birth:  25-Jul-1945           BSA:          1.912 m Patient Age:    76 years  BP:           174/78 mmHg Patient Gender: M                   HR:           68 bpm. Exam Location:  Inpatient Procedure: 2D Echo, Cardiac Doppler and Color Doppler Indications:    Stroke I63.9  History:        Patient has prior history of Echocardiogram examinations, most                 recent 02/17/2020. COPD, Arrythmias:Atrial Fibrillation; Risk                 Factors:Hypertension, Dyslipidemia, Diabetes and ETOH.  Sonographer:    Bernadene Person RDCS Referring Phys: [place]  1. Left ventricular ejection fraction, by estimation, is 70 to 75%. The left ventricle has hyperdynamic function. The left ventricle has no regional wall motion abnormalities. There is mild left ventricular hypertrophy. Left ventricular diastolic parameters are indeterminate.  2. Right ventricular systolic function is normal. The right ventricular size is mildly enlarged. There is normal pulmonary artery systolic pressure. The estimated right ventricular systolic pressure is 41.9 mmHg.  3. Left atrial size was severely dilated.  4. Right atrial size was moderately  dilated.  5. The mitral valve is normal in structure. No evidence of mitral valve regurgitation. No evidence of mitral stenosis.  6. The aortic valve is normal in structure. Aortic valve regurgitation is not visualized. No aortic stenosis is present.  7. Aortic dilatation noted. There is mild dilatation of the aortic root, measuring 42 mm. There is mild dilatation of the ascending aorta, measuring 41 mm.  8. The inferior vena cava is dilated in size with >50% respiratory variability, suggesting right atrial pressure of 8 mmHg. Comparison(s): Prior images reviewed side by side. Conclusion(s)/Recommendation(s): No intracardiac source of embolism detected on this transthoracic study. Consider a transesophageal echocardiogram to exclude cardiac source of embolism if clinically indicated. FINDINGS  Left Ventricle: Left ventricular ejection fraction, by estimation, is 70 to 75%. The left ventricle has hyperdynamic function. The left ventricle has no regional wall motion abnormalities. The left ventricular internal cavity size was normal in size. There is mild left ventricular hypertrophy. Left ventricular diastolic parameters are indeterminate. Right Ventricle: The right ventricular size is mildly enlarged. No increase in right ventricular wall thickness. Right ventricular systolic function is normal. There is normal pulmonary artery systolic pressure. The tricuspid regurgitant velocity is 1.54  m/s, and with an assumed right atrial pressure of 8 mmHg, the estimated right ventricular systolic pressure is 62.2 mmHg. Left Atrium: Left atrial size was severely dilated. Right Atrium: Right atrial size was moderately dilated. Pericardium: There is no evidence of pericardial effusion. Mitral Valve: The mitral valve is normal in structure. No evidence of mitral valve regurgitation. No evidence of mitral valve stenosis. Tricuspid Valve: The tricuspid valve is normal in structure. Tricuspid valve regurgitation is trivial. No  evidence of tricuspid stenosis. Aortic Valve: The aortic valve is normal in structure. Aortic valve regurgitation is not visualized. No aortic stenosis is present. Pulmonic Valve: The pulmonic valve was normal in structure. Pulmonic valve regurgitation is trivial. No evidence of pulmonic stenosis. Aorta: Aortic dilatation noted. There is mild dilatation of the aortic root, measuring 42 mm. There is mild dilatation of the ascending aorta, measuring 41 mm. Venous: The inferior vena cava is dilated in size with greater than 50% respiratory variability, suggesting right  atrial pressure of 8 mmHg. IAS/Shunts: No atrial level shunt detected by color flow Doppler.  LEFT VENTRICLE PLAX 2D LVIDd:         4.10 cm LVIDs:         2.40 cm LV PW:         1.30 cm LV IVS:        1.40 cm LVOT diam:     2.30 cm LV SV:         70 LV SV Index:   37 LVOT Area:     4.15 cm  LV Volumes (MOD) LV vol d, MOD A2C: 82.7 ml LV vol d, MOD A4C: 83.2 ml LV vol s, MOD A2C: 32.9 ml LV vol s, MOD A4C: 35.0 ml LV SV MOD A2C:     49.8 ml LV SV MOD A4C:     83.2 ml LV SV MOD BP:      48.3 ml RIGHT VENTRICLE RV S prime:     7.93 cm/s TAPSE (M-mode): 1.9 cm LEFT ATRIUM              Index        RIGHT ATRIUM           Index LA diam:        4.50 cm  2.35 cm/m   RA Area:     28.60 cm LA Vol (A2C):   108.0 ml 56.49 ml/m  RA Volume:   91.00 ml  47.59 ml/m LA Vol (A4C):   121.0 ml 63.28 ml/m LA Biplane Vol: 120.0 ml 62.76 ml/m  AORTIC VALVE LVOT Vmax:   106.33 cm/s LVOT Vmean:  64.500 cm/s LVOT VTI:    0.168 m  AORTA Ao Root diam: 4.20 cm Ao Asc diam:  4.10 cm TRICUSPID VALVE TR Peak grad:   9.5 mmHg TR Vmax:        154.00 cm/s  SHUNTS Systemic VTI:  0.17 m Systemic Diam: 2.30 cm Candee Furbish MD Electronically signed by Candee Furbish MD Signature Date/Time: 01/25/2022/12:52:11 PM    Final    CT ANGIO HEAD NECK W WO CM W PERF  Result Date: 01/25/2022 CLINICAL DATA:  Neuro deficit with acute stroke suspected. EXAM: CT ANGIOGRAPHY HEAD AND NECK CT PERFUSION  BRAIN TECHNIQUE: Multidetector CT imaging of the head and neck was performed using the standard protocol during bolus administration of intravenous contrast. Multiplanar CT image reconstructions and MIPs were obtained to evaluate the vascular anatomy. Carotid stenosis measurements (when applicable) are obtained utilizing NASCET criteria, using the distal internal carotid diameter as the denominator. Multiphase CT imaging of the brain was performed following IV bolus contrast injection. Subsequent parametric perfusion maps were calculated using RAPID software. RADIATION DOSE REDUCTION: This exam was performed according to the departmental dose-optimization program which includes automated exposure control, adjustment of the mA and/or kV according to patient size and/or use of iterative reconstruction technique. CONTRAST:  146m OMNIPAQUE IOHEXOL 350 MG/ML SOLN COMPARISON:  Head CT from yesterday FINDINGS: CT HEAD FINDINGS Brain: Chronic small vessel ischemia with confluent biparietal white matter low-density and remote left occipital cortex infarct. Generalized brain atrophy with ventriculomegaly. Vascular: See below Skull: No acute finding Sinuses: Large retention cyst in the right maxillary sinus. Orbits: Negative Review of the MIP images confirms the above findings CTA NECK FINDINGS Aortic arch: Partial coverage is negative for acute finding or dilatation. Three vessel branching. Right carotid system: Atheromatous calcification at the bifurcation. No stenosis or ulceration. ICA tortuosity. Left carotid system: Mild atheromatous  plaque for age. ICA tortuosity. No stenosis or ulceration Vertebral arteries: No proximal subclavian stenosis. Atheromatous plaque at the right vertebral origin. The vertebral arteries are smoothly contoured and widely patent when allowing for tortuosity accentuated by vertebral spurring. Skeleton: Advanced cervical spine degeneration with reversal of cervical lordosis. Other neck: No acute  finding Upper chest: No acute finding Review of the MIP images confirms the above findings CTA HEAD FINDINGS Anterior circulation: Atheromatous calcification affecting the carotid siphons. Mild atheromatous irregularity of branch vessels. No branch occlusion, beading, or flow limiting stenosis. Negative for aneurysm. Posterior circulation: Vertebral and basilar tortuosity without stenosis. Fetal type right PCA. No branch occlusion, beading, or aneurysm. Venous sinuses: Unremarkable for the contrast phase Anatomic variants: As above CT Brain Perfusion Findings: Nondiagnostic due to motion artifact. No superimposed gross perfusion deficits. IMPRESSION: 1. No emergent finding. 2. Atherosclerosis without significant stenosis of major arteries in the head and neck. 3. Nondiagnostic CT perfusion due to motion. No evidence of superimposed large territory deficit. Electronically Signed   By: Jorje Guild M.D.   On: 01/25/2022 04:44   DG Chest Portable 1 View  Result Date: 01/24/2022 CLINICAL DATA:  Altered mental status.  Cough. EXAM: PORTABLE CHEST 1 VIEW COMPARISON:  10/22/2020 FINDINGS: 1310 hours. Low volume film. The cardio pericardial silhouette is enlarged. The lungs are clear without focal pneumonia, edema, pneumothorax or pleural effusion. Bones are diffusely demineralized. Telemetry leads overlie the chest. IMPRESSION: Low volume film without acute cardiopulmonary findings. Electronically Signed   By: Misty Stanley M.D.   On: 01/24/2022 13:25   CT HEAD CODE STROKE WO CONTRAST  Result Date: 01/24/2022 CLINICAL DATA:  Code stroke. Provided history: Neuro deficit, acute, stroke suspected. EXAM: CT HEAD WITHOUT CONTRAST TECHNIQUE: Contiguous axial images were obtained from the base of the skull through the vertex without intravenous contrast. RADIATION DOSE REDUCTION: This exam was performed according to the departmental dose-optimization program which includes automated exposure control, adjustment of the mA  and/or kV according to patient size and/or use of iterative reconstruction technique. COMPARISON:  Prior head CT examinations 10/22/2020 and earlier. FINDINGS: Significantly motion degraded examination, limiting evaluation. Additionally, portions of the anteroinferior frontal lobes are excluded from the field of view (left greater than right). Brain: Moderate cerebral atrophy. Comparatively mild cerebellar atrophy. Moderate patchy and ill-defined hypoattenuation within the cerebral white matter, nonspecific but compatible with chronic small vessel disease. Known chronic cortical/subcortical left occipital lobe infarct (PCA vascular territory). Within the limitations of significant motion degradation, no definite intracranial hemorrhage or acute demarcated cortical infarct is identified. No appreciable intracranial mass or extra-axial fluid collection. No midline shift. Vascular: No hyperdense vessel. Atherosclerotic calcifications. Skull: No fracture or aggressive osseous lesion. Sinuses/Orbits: The orbits and the majority of the paranasal sinuses are excluded from the field of view. No appreciable significant paranasal sinus disease. ASPECTS ([place] Stroke Program Early CT Score) - Ganglionic level infarction (caudate, lentiform nuclei, internal capsule, insula, M1-M3 cortex): 7 - Supraganglionic infarction (M4-M6 cortex): 3 Total score (0-10 with 10 being normal): 10 Impressions #1 and #2 communicated to Dr. Leonie Man at 12:49 pmon 01/24/2022 by text page via the [facility] messaging system. IMPRESSION: 1. Examination significantly limited by motion degradation. Additionally, portions of the anteroinferior frontal lobes are excluded from the field of view (left greater than right). 2. Within this limitation, no definite acute intracranial abnormality is identified. 3. Known chronic left occipital lobe PCA territory infarct. 4. Moderate chronic small ischemic changes within the cerebral white matter. 5. Moderate cerebral  atrophy. 6. Comparatively mild cerebellar atrophy. Electronically Signed   By: Kellie Simmering D.O.   On: 01/24/2022 12:52     PHYSICAL EXAM  Temp:  [99.2 F (37.3 C)-99.6 F (37.6 C)] 99.6 F (37.6 C) (07/08 0855) Pulse Rate:  [66-88] 67 (07/08 0855) Resp:  [16-34] 20 (07/08 0855) BP: (145-187)/(67-97) 174/78 (07/08 0855) SpO2:  [92 %-100 %] 94 % (07/08 0855) Weight:  [67.1 kg] 67.1 kg (07/07 2102)  General - Well nourished, well developed, drowsy and lethargic  Ophthalmologic - fundi not visualized due to noncooperation.  Cardiovascular - irregularly irregular heart rate and rhythm.  Neuro - drowsy and lethargic, eyes open on voice, orientated to age, place, time. No aphasia, however paucity of speech with SOB, moderate dysarthria, following all simple commands. Able to name and repeat in dysarthric voice. No gaze palsy, tracking bilaterally, blinking to visual threat bilaterally. Seems to have mild left facial droop. Tongue midline. Bilateral UEs 3/5, drift bilaterally. Bilaterally LE3-/5, toe movement 3/5. Sensation symmetrical bilaterally subjectively, b/l FTN difficulty to complete but no obvious ataxia, gait not tested.    ASSESSMENT/PLAN [name] is a 76 y.o. male with history of hypertension, hyperlipidemia, diabetes, A-fib on Eliquis, left occipital stroke 2 years ago admitted for right facial droop and slurred speech. No tPA given due to outside window.    Encephalopathy  Delirium  Waxing and waning mental status overnight Likely multifactorial - respiratory distress, COVID with coughing, UTI, ? Stroke, deconditioning, ? transient fever, and hypertensive  On rocephin  Ammonia level negative Continue monitoring Treat underlying condition  COVID infection Cough Frequent coughing, not able to have MRI done COVID PCR positive Intermittent slight hypoxia Management per primary team  ? Stroke - MRI pending Initially reported right facial droop and slurry,  now seems to have mild left facial droop CT head old left PCA infarct CTA head and neck unremarkable MRI  pending 2D Echo  70-75% LDL 94 HgbA1c pending UDS negative Eliquis  for VTE prophylaxis Eliquis (apixaban) daily prior to admission, now on Eliquis (apixaban) daily.  Ongoing aggressive stroke risk factor management Therapy recommendations:  SNF Disposition:  pending  Chronic A-fib Rate controlled On home Eliquis On home metoprolol  Diabetes HgbA1c pending goal < 7.0 Controlled CBG monitoring SSI DM education and close PCP follow up  Hypertension Unstable, on high and On Norvasc 10, Lasix, metoprolol Long term BP goal normotensive  Hyperlipidemia Home meds: Pravastatin 40 LDL 94, goal < 70 Now on Crestor 20 Continue statin at discharge  Other Stroke Risk Factors Advanced age Hx stroke/TIA - CT old left PCA infarct  Other Active Problems CKD 3A, creatinine 1.79 UTI, UA WBC 6-10, on Rocephin. Venous insufficiency, bilateral lower extremity swelling, on [facility] day # 0   discussed with Dr. Doristine Bosworth. I spent extensive time with the patient, more than 50% of which was spent in counseling and coordination of care, reviewing test results, images and medication, and discussing the diagnosis, treatment plan and potential prognosis. This patient's care requiresreview of multiple databases, neurological assessment, discussion with family, other specialists and medical decision making of high complexity.   Rosalin Hawking, MD PhD Stroke Neurology 01/25/2022 4:49 PM    To contact Stroke Continuity provider, please refer to [email or web address] After hours, contact General Neurology

## 2022-01-25 NOTE — Progress Notes (Signed)
  Echocardiogram 2D Echocardiogram has been performed.  [name] 01/25/2022, 12:10 PM

## 2022-01-25 NOTE — Procedures (Signed)
Routine EEG Report  [name] is a 76 y.o. male with a history of prior stroke and waxing/waning mental status who is undergoing an EEG to evaluate for seizures.  Report: This EEG was acquired with electrodes placed according to the International 10-20 electrode system (including Fp1, Fp2, F3, F4, C3, C4, P3, P4, O1, O2, T3, T4, T5, T6, A1, A2, Fz, Cz, Pz). The following electrodes were missing or displaced: none.  The occipital dominant rhythm was 5-6 Hz with intermittent more severe slowing diffusely. This activity is reactive to stimulation. Drowsiness was manifested by background fragmentation; deeper stages of sleep were not identified. There was no focal slowing. There were no interictal epileptiform discharges. There were no electrographic seizures identified. Photic stimulation and hyperventilation were not performed.  Impression and clinical correlation: This EEG was obtained while awake and drowsy and is abnormal due to moderate diffuse slowing indicative of global cerebral dysfunction. Epileptiform abnormalities were not seen during this recording.  Su Monks, MD [facility] [phone]  If 7pm- 7am, please page neurology on call as listed in [place].

## 2022-01-25 NOTE — Evaluation (Signed)
Occupational Therapy Evaluation Patient Details Name: [name] MRN: [id number] DOB: 1946/07/13 Today's Date: 01/25/2022   History of Present Illness 76 y/o male presented to ED on 01/24/22 as code stroke for L facial droop and slurred speech. CT head limited by motion but no definite acute abnormality. Stroke work up underway. 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 Pt is alone during the day while daughter works. Pt demonstrates a significant change in functional status due to deficits listed below. Currently requires Max A for ADL tasks and Max A to total A +2 for bed mobility; unable to sit unsupported EOB  - R and posterior lean, unable to stand at this time. Increased WOB with minimal activity - poor pleath however SpO2 appeared to drop with increased activity. Recommend rehab at  SNF. Acute OT to follow.      Recommendations for follow up therapy are one component of a multi-disciplinary discharge planning process, led by the attending physician.  Recommendations may be updated based on patient status, additional functional criteria and insurance authorization.   Follow Up Recommendations  Skilled nursing-short term rehab (<3 hours/day)    Assistance Recommended at Discharge Frequent or constant Supervision/Assistance  Patient can return home with the following Two people to help with walking and/or transfers;Two people to help with bathing/dressing/bathroom;Assistance with cooking/housework;Assistance with feeding;Direct supervision/assist for medications management;Direct supervision/assist for financial management;Assist for transportation;Help with stairs or ramp for entrance    Functional Status Assessment  Patient has had a recent decline in their  functional status and demonstrates the ability to make significant improvements in function in a reasonable and predictable amount of time.  Equipment Recommendations  BSC/3in1;Wheelchair (measurements OT);Wheelchair cushion (measurements OT)    Recommendations for Other Services       Precautions / Restrictions Precautions Precautions: Fall Precaution Comments: Covid Restrictions Weight Bearing Restrictions: No      Mobility Bed Mobility Overal bed mobility: Needs Assistance Bed Mobility: Supine to Sit, Sit to Supine     Supine to sit: Max assist, +2 for physical assistance Sit to supine: Total assist, +2 for physical assistance   General bed mobility comments: pt "falling postierorly". unable to return to bed independently safely as he was attempting to lie sideways on the bed; used bed pad to help mobilize back to bed    Transfers Overall transfer level: Needs assistance                 General transfer comment: unable; bed also not staying elevated; unable to safely attempt; uses a lift chair at baseline      Balance Overall balance assessment: Needs assistance   Sitting balance-Leahy Scale: Poor                                     ADL either performed or assessed with clinical judgement   ADL Overall ADL's : Needs assistance/impaired Eating/Feeding: Minimal assistance   Grooming: Moderate assistance   Upper Body Bathing: Moderate assistance   Lower Body Bathing: Maximal assistance   Upper Body Dressing : Moderate assistance   Lower Body Dressing: Total assistance  Functional mobility during ADLs:  (unable to stand this date)       Vision Patient Visual Report: Peripheral vision impairment;Other (comment) ("film" over his eyes; hx of R HH) Additional Comments: r field cut     Perception     Praxis      Pertinent Vitals/Pain Pain Assessment Pain Assessment: Faces Faces Pain Scale: Hurts little more Pain  Location: back Pain Descriptors / Indicators: Discomfort, Grimacing Pain Intervention(s): Limited activity within patient's tolerance     Hand Dominance Right   Extremity/Trunk Assessment Upper Extremity Assessment Upper Extremity Assessment: RUE deficits/detail RUE Deficits / Details: RUE appeasr weaker than L with decreased fine motor/coordination; able to use to pick up cup however unablet o bring cup completely to mouth to sip from straw RUE Coordination: decreased fine motor;decreased gross motor   Lower Extremity Assessment Lower Extremity Assessment: Defer to PT evaluation   Cervical / Trunk Assessment Cervical / Trunk Assessment: Kyphotic;Other exceptions (forward head; R/posterior bias)   Communication Communication Communication: No difficulties   Cognition Arousal/Alertness: Awake/alert Behavior During Therapy: Impulsive Overall Cognitive Status: Impaired/Different from baseline Area of Impairment: Orientation, Attention, Memory, Following commands, Safety/judgement, Awareness, Problem solving                 Orientation Level: Disoriented to, Time, Situation Current Attention Level: Sustained Memory: Decreased short-term memory Following Commands: Follows one step commands with increased time Safety/Judgement: Decreased awareness of safety, Decreased awareness of deficits Awareness: Intellectual Problem Solving: Slow processing, Decreased initiation, Difficulty sequencing, Requires verbal cues, Requires tactile cues General Comments: falling posterioroly adn to the R and states this is "normla"; unable to stand and states that he is "normal"; dtr present adn states cognition is impaired     General Comments  skin issues BLE    Exercises     Shoulder Instructions      Home Living Family/patient expects to be discharged to:: Private residence Living Arrangements: Children Available Help at Discharge: Family;Available PRN/intermittently (daughter works.  50 y/o granddaughter is home during the summer) Type of Home: House Home Access: Stairs to enter [employer] of Steps: 2 Entrance Stairs-Rails: Left Home Layout: One level     Bathroom Shower/Tub: Tub/shower unit;Curtain;Sponge bathes at baseline   [employer]: Standard     Home Equipment: Conservation officer, nature (2 wheels);Toilet riser;Shower seat;Hospital bed (Lift chair)   Additional Comments: has hospital bed but too small so sleeps in lift chair      Prior Functioning/Environment Prior Level of Function : Needs assist;History of Falls (last six months)       Physical Assist : ADLs (physical)   ADLs (physical): Bathing;Dressing Mobility Comments: ambulates to/from bathroom occasionally. uses urinal primarily. Reports 1 fall in past 6 months ADLs Comments: needs help with set up for bathing and dressing; sleeps in lift chair        OT Problem List: Decreased strength;Decreased range of motion;Decreased activity tolerance;Impaired balance (sitting and/or standing);Impaired vision/perception;Decreased coordination;Decreased cognition;Decreased safety awareness;Decreased knowledge of use of DME or AE;Cardiopulmonary status limiting activity;Obesity;Impaired UE functional use;Pain;Increased edema      OT Treatment/Interventions: Self-care/ADL training;Therapeutic exercise;Neuromuscular education;Energy conservation;DME and/or AE instruction;Therapeutic activities;Cognitive remediation/compensation;Visual/perceptual remediation/compensation;Patient/family education;Balance training    OT Goals(Current goals can be found in the care plan section) Acute Rehab OT Goals Patient Stated Goal: to get stronger and return home OT Goal Formulation: With patient/family Time For Goal Achievement: 02/08/22 Potential to Achieve Goals: Good  OT Frequency: Min 2X/week    Co-evaluation PT/OT/SLP Co-Evaluation/Treatment: Yes Reason for Co-Treatment:  To address functional/ADL  transfers;For patient/therapist safety   OT goals addressed during session: ADL's and self-care      AM-PAC OT "6 Clicks" Daily Activity     Outcome Measure Help from another person eating meals?: A Little Help from another person taking care of personal grooming?: A Lot Help from another person toileting, which includes using toliet, bedpan, or urinal?: Total Help from another person bathing (including washing, rinsing, drying)?: A Lot Help from another person to put on and taking off regular upper body clothing?: A Lot Help from another person to put on and taking off regular lower body clothing?: Total 6 Click Score: 11   End of Session Equipment Utilized During Treatment: Gait belt;Rolling walker (2 wheels) Nurse Communication: Mobility status;Need for lift equipment  Activity Tolerance: Patient tolerated treatment well Patient left: in bed;with call bell/phone within reach;with bed alarm set;with family/visitor present (chair position)  OT Visit Diagnosis: Unsteadiness on feet (R26.81);Other abnormalities of gait and mobility (R26.89);Muscle weakness (generalized) (M62.81);History of falling (Z91.81);Other symptoms and signs involving cognitive function;Pain Pain - part of body:  (back)                Time: 9532-0233 OT Time Calculation (min): 35 min Charges:  OT General Charges $OT Visit: 1 Visit OT Evaluation $OT Eval Moderate Complexity: [place], OT/L   Acute OT Clinical Specialist [place] Pager [phone] Office [phone]   [facility] 01/25/2022, 11:59 AM

## 2022-01-25 NOTE — Progress Notes (Signed)
Overnight event  Patient's nurse reporting worsening NIH, aphasia and right sided neglect.  -Stat CT head ordered and neurology notified

## 2022-01-25 NOTE — Progress Notes (Addendum)
Per hospitalist, need MRI as soon as possible, spoke to MRI via phone - pt was unable to previously get MRI d/t inability to fit d/t kyphosis & coughing - will need to get pt to lie flat on floor. Hospitalist updated via page.   [id number]: updated via page re: new o2 need 2L

## 2022-01-26 ENCOUNTER — Other Ambulatory Visit: Payer: Self-pay | Admitting: Cardiology

## 2022-01-26 DIAGNOSIS — G9341 Metabolic encephalopathy: Secondary | ICD-10-CM | POA: Diagnosis present

## 2022-01-26 DIAGNOSIS — Z8673 Personal history of transient ischemic attack (TIA), and cerebral infarction without residual deficits: Secondary | ICD-10-CM | POA: Diagnosis not present

## 2022-01-26 DIAGNOSIS — L89152 Pressure ulcer of sacral region, stage 2: Secondary | ICD-10-CM | POA: Diagnosis present

## 2022-01-26 DIAGNOSIS — N1832 Chronic kidney disease, stage 3b: Secondary | ICD-10-CM | POA: Diagnosis present

## 2022-01-26 DIAGNOSIS — E785 Hyperlipidemia, unspecified: Secondary | ICD-10-CM | POA: Diagnosis present

## 2022-01-26 DIAGNOSIS — B351 Tinea unguium: Secondary | ICD-10-CM | POA: Diagnosis present

## 2022-01-26 DIAGNOSIS — I872 Venous insufficiency (chronic) (peripheral): Secondary | ICD-10-CM

## 2022-01-26 DIAGNOSIS — F1021 Alcohol dependence, in remission: Secondary | ICD-10-CM | POA: Diagnosis present

## 2022-01-26 DIAGNOSIS — U071 COVID-19: Secondary | ICD-10-CM | POA: Diagnosis present

## 2022-01-26 DIAGNOSIS — E872 Acidosis, unspecified: Secondary | ICD-10-CM | POA: Diagnosis present

## 2022-01-26 DIAGNOSIS — I639 Cerebral infarction, unspecified: Secondary | ICD-10-CM | POA: Diagnosis not present

## 2022-01-26 DIAGNOSIS — I472 Ventricular tachycardia, unspecified: Secondary | ICD-10-CM | POA: Diagnosis not present

## 2022-01-26 DIAGNOSIS — I4821 Permanent atrial fibrillation: Secondary | ICD-10-CM | POA: Diagnosis present

## 2022-01-26 DIAGNOSIS — J449 Chronic obstructive pulmonary disease, unspecified: Secondary | ICD-10-CM | POA: Diagnosis present

## 2022-01-26 DIAGNOSIS — R059 Cough, unspecified: Secondary | ICD-10-CM | POA: Diagnosis not present

## 2022-01-26 DIAGNOSIS — A419 Sepsis, unspecified organism: Secondary | ICD-10-CM | POA: Diagnosis present

## 2022-01-26 DIAGNOSIS — I4892 Unspecified atrial flutter: Secondary | ICD-10-CM | POA: Diagnosis present

## 2022-01-26 DIAGNOSIS — R414 Neurologic neglect syndrome: Secondary | ICD-10-CM | POA: Diagnosis present

## 2022-01-26 DIAGNOSIS — E1140 Type 2 diabetes mellitus with diabetic neuropathy, unspecified: Secondary | ICD-10-CM | POA: Diagnosis present

## 2022-01-26 DIAGNOSIS — I5032 Chronic diastolic (congestive) heart failure: Secondary | ICD-10-CM | POA: Diagnosis present

## 2022-01-26 DIAGNOSIS — E1122 Type 2 diabetes mellitus with diabetic chronic kidney disease: Secondary | ICD-10-CM | POA: Diagnosis present

## 2022-01-26 DIAGNOSIS — N390 Urinary tract infection, site not specified: Secondary | ICD-10-CM | POA: Diagnosis present

## 2022-01-26 DIAGNOSIS — G459 Transient cerebral ischemic attack, unspecified: Secondary | ICD-10-CM | POA: Diagnosis present

## 2022-01-26 DIAGNOSIS — I248 Other forms of acute ischemic heart disease: Secondary | ICD-10-CM | POA: Diagnosis present

## 2022-01-26 DIAGNOSIS — R32 Unspecified urinary incontinence: Secondary | ICD-10-CM

## 2022-01-26 DIAGNOSIS — I130 Hypertensive heart and chronic kidney disease with heart failure and stage 1 through stage 4 chronic kidney disease, or unspecified chronic kidney disease: Secondary | ICD-10-CM | POA: Diagnosis present

## 2022-01-26 DIAGNOSIS — F05 Delirium due to known physiological condition: Secondary | ICD-10-CM | POA: Diagnosis not present

## 2022-01-26 LAB — GLUCOSE, CAPILLARY
Glucose-Capillary: 98 mg/dL (ref 70–99)
Glucose-Capillary: 141 mg/dL — ABNORMAL HIGH (ref 70–99)
Glucose-Capillary: 88 mg/dL (ref 70–99)
Glucose-Capillary: 124 mg/dL — ABNORMAL HIGH (ref 70–99)

## 2022-01-26 LAB — BASIC METABOLIC PANEL
Anion gap: 9 (ref 5–15)
BUN: 19 mg/dL (ref 8–23)
CO2: 21 mmol/L — ABNORMAL LOW (ref 22–32)
Calcium: 7.9 mg/dL — ABNORMAL LOW (ref 8.9–10.3)
Chloride: 105 mmol/L (ref 98–111)
Creatinine, Ser: 1.75 mg/dL — ABNORMAL HIGH (ref 0.61–1.24)
GFR, Estimated: 40 mL/min — ABNORMAL LOW (ref >60)
Glucose, Bld: 95 mg/dL (ref 70–99)
Potassium: 3.8 mmol/L (ref 3.5–5.1)
Sodium: 135 mmol/L (ref 135–145)

## 2022-01-26 LAB — MAGNESIUM: Magnesium: 1.9 mg/dL (ref 1.7–2.4)

## 2022-01-26 LAB — HEMOGLOBIN A1C
Hgb A1c MFr Bld: 6.5 % — ABNORMAL HIGH (ref 4.8–5.6)
Mean Plasma Glucose: 139.85 mg/dL

## 2022-01-26 MED ORDER — AMLODIPINE BESYLATE 10 MG PO TABS
10.0000 mg | ORAL_TABLET | Freq: Every day | ORAL | Status: DC
  Administered 2022-01-26 – 2022-01-30 (×5): 10 mg via ORAL
  Filled 2022-01-26 (×5): qty 1

## 2022-01-26 NOTE — Progress Notes (Signed)
Patient requesting to have BM.  Offered patient bedpan and he refused.  Patient states he will only have BM if nurse gets walker and he walks to restroom.  Explained to patient that he was to weak to walk to bathroom, but nurse offered to try to attempt to get to bedside commode.  Patient became very angry and refused BSC.  He states he will wait for his daughter to get to hospital and she will take him to bathroom.  Tried many times to explain to patient why BSC or bedpan was best option.

## 2022-01-26 NOTE — Progress Notes (Signed)
STROKE TEAM PROGRESS NOTE   SUBJECTIVE (INTERVAL HISTORY) No family is at the bedside. Pt reclining in bed, eyes open, more awake alert than yesterday. No SOB today. He is in agreement for re-attempting MRI.    OBJECTIVE Temp:  [98 F (36.7 C)-99.4 F (37.4 C)] 98.2 F (36.8 C) (07/09 0800) Pulse Rate:  [63-87] 87 (07/09 0800) Cardiac Rhythm: Atrial flutter (07/09 0701) Resp:  [20-36] 36 (07/09 0800) BP: (123-190)/(74-107) 178/107 (07/09 0800) SpO2:  [90 %-97 %] 97 % (07/09 0800)  Recent Labs  Lab 01/25/22 1239 01/25/22 1710 01/25/22 2211 01/26/22 0615 01/26/22 1248  GLUCAP 98 90 92 98 141*   Recent Labs  Lab 01/24/22 1440 01/25/22 0224 01/26/22 0057  NA 135  --  135  K 4.3  --  3.8  CL 102  --  105  CO2 20*  --  21*  GLUCOSE 106*  --  95  BUN 20  --  19  CREATININE 1.79*  --  1.75*  CALCIUM 8.9  --  7.9*  MG  --  1.5* 1.9   Recent Labs  Lab 01/24/22 1440  AST 28  ALT 12  ALKPHOS 132*  BILITOT 0.7  PROT 8.8*  ALBUMIN 2.6*   Recent Labs  Lab 01/24/22 1300  WBC 6.8  NEUTROABS 4.8  HGB 13.3  HCT 42.5  MCV 86.4  PLT 181   No results for input(s): "CKTOTAL", "CKMB", "CKMBINDEX", "TROPONINI" in the last 168 hours. Recent Labs    01/24/22 1433  LABPROT 15.3*  INR 1.2   Recent Labs    01/24/22 1547  COLORURINE AMBER*  LABSPEC 1.024  PHURINE 6.0  GLUCOSEU NEGATIVE  HGBUR SMALL*  BILIRUBINUR NEGATIVE  KETONESUR NEGATIVE  PROTEINUR >=300*  NITRITE NEGATIVE  LEUKOCYTESUR TRACE*       Component Value Date/Time   CHOL 145 01/25/2022 0224   CHOL 142 08/23/2020 1504   TRIG 61 01/25/2022 0224   HDL 39 (L) 01/25/2022 0224   HDL 41 08/23/2020 1504   CHOLHDL 3.7 01/25/2022 0224   VLDL 12 01/25/2022 0224   LDLCALC 94 01/25/2022 0224   LDLCALC 88 08/23/2020 1504   Lab Results  Component Value Date   HGBA1C 6.5 (H) 01/26/2022      Component Value Date/Time   LABOPIA NONE DETECTED 01/24/2022 1547   COCAINSCRNUR NONE DETECTED 01/24/2022 1547    LABBENZ NONE DETECTED 01/24/2022 1547   AMPHETMU NONE DETECTED 01/24/2022 1547   THCU NONE DETECTED 01/24/2022 1547   LABBARB NONE DETECTED 01/24/2022 1547    Recent Labs  Lab 01/24/22 1440  ETH <10    I have personally reviewed the radiological images below and agree with the radiology interpretations.  EEG adult  Result Date: 01/25/2022 Derek Jack, MD     01/25/2022  7:02 PM Routine EEG Report Breyer Lawniczak is a 76 y.o. male with a history of prior stroke and waxing/waning mental status who is undergoing an EEG to evaluate for seizures. Report: This EEG was acquired with electrodes placed according to the International 10-20 electrode system (including Fp1, Fp2, F3, F4, C3, C4, P3, P4, O1, O2, T3, T4, T5, T6, A1, A2, Fz, Cz, Pz). The following electrodes were missing or displaced: none. The occipital dominant rhythm was 5-6 Hz with intermittent more severe slowing diffusely. This activity is reactive to stimulation. Drowsiness was manifested by background fragmentation; deeper stages of sleep were not identified. There was no focal slowing. There were no interictal epileptiform discharges. There were no electrographic  seizures identified. Photic stimulation and hyperventilation were not performed. Impression and clinical correlation: This EEG was obtained while awake and drowsy and is abnormal due to moderate diffuse slowing indicative of global cerebral dysfunction. Epileptiform abnormalities were not seen during this recording. Su Monks, MD [facility] [phone] If 7pm- 7am, please page neurology on call as listed in [place].   ECHOCARDIOGRAM COMPLETE  Result Date: 01/25/2022    ECHOCARDIOGRAM REPORT   Patient Name:   QUINNTON BURY Date of Exam: 01/25/2022 Medical Rec #:  [id number]           Height:       74.0 in Accession #:    [id number]          Weight:       147.9 lb Date of Birth:  [birth year]           BSA:          1.912 m Patient Age:    45 years             BP:           174/78 mmHg Patient Gender: M                   HR:           68 bpm. Exam Location:  Inpatient Procedure: 2D Echo, Cardiac Doppler and Color Doppler Indications:    Stroke I63.9  History:        Patient has prior history of Echocardiogram examinations, most                 recent 02/17/2020. COPD, Arrythmias:Atrial Fibrillation; Risk                 Factors:Hypertension, Dyslipidemia, Diabetes and ETOH.  Sonographer:    Bernadene Person RDCS Referring Phys: [place]  1. Left ventricular ejection fraction, by estimation, is 70 to 75%. The left ventricle has hyperdynamic function. The left ventricle has no regional wall motion abnormalities. There is mild left ventricular hypertrophy. Left ventricular diastolic parameters are indeterminate.  2. Right ventricular systolic function is normal. The right ventricular size is mildly enlarged. There is normal pulmonary artery systolic pressure. The estimated right ventricular systolic pressure is 65.7 mmHg.  3. Left atrial size was severely dilated.  4. Right atrial size was moderately dilated.  5. The mitral valve is normal in structure. No evidence of mitral valve regurgitation. No evidence of mitral stenosis.  6. The aortic valve is normal in structure. Aortic valve regurgitation is not visualized. No aortic stenosis is present.  7. Aortic dilatation noted. There is mild dilatation of the aortic root, measuring 42 mm. There is mild dilatation of the ascending aorta, measuring 41 mm.  8. The inferior vena cava is dilated in size with >50% respiratory variability, suggesting right atrial pressure of 8 mmHg. Comparison(s): Prior images reviewed side by side. Conclusion(s)/Recommendation(s): No intracardiac source of embolism detected on this transthoracic study. Consider a transesophageal echocardiogram to exclude cardiac source of embolism if clinically indicated. FINDINGS  Left Ventricle: Left ventricular ejection fraction, by  estimation, is 70 to 75%. The left ventricle has hyperdynamic function. The left ventricle has no regional wall motion abnormalities. The left ventricular internal cavity size was normal in size. There is mild left ventricular hypertrophy. Left ventricular diastolic parameters are indeterminate. Right Ventricle: The right ventricular size is mildly enlarged. No increase in right ventricular wall thickness. Right ventricular systolic function is normal. There is  normal pulmonary artery systolic pressure. The tricuspid regurgitant velocity is 1.54  m/s, and with an assumed right atrial pressure of 8 mmHg, the estimated right ventricular systolic pressure is 09.3 mmHg. Left Atrium: Left atrial size was severely dilated. Right Atrium: Right atrial size was moderately dilated. Pericardium: There is no evidence of pericardial effusion. Mitral Valve: The mitral valve is normal in structure. No evidence of mitral valve regurgitation. No evidence of mitral valve stenosis. Tricuspid Valve: The tricuspid valve is normal in structure. Tricuspid valve regurgitation is trivial. No evidence of tricuspid stenosis. Aortic Valve: The aortic valve is normal in structure. Aortic valve regurgitation is not visualized. No aortic stenosis is present. Pulmonic Valve: The pulmonic valve was normal in structure. Pulmonic valve regurgitation is trivial. No evidence of pulmonic stenosis. Aorta: Aortic dilatation noted. There is mild dilatation of the aortic root, measuring 42 mm. There is mild dilatation of the ascending aorta, measuring 41 mm. Venous: The inferior vena cava is dilated in size with greater than 50% respiratory variability, suggesting right atrial pressure of 8 mmHg. IAS/Shunts: No atrial level shunt detected by color flow Doppler.  LEFT VENTRICLE PLAX 2D LVIDd:         4.10 cm LVIDs:         2.40 cm LV PW:         1.30 cm LV IVS:        1.40 cm LVOT diam:     2.30 cm LV SV:         70 LV SV Index:   37 LVOT Area:     4.15 cm   LV Volumes (MOD) LV vol d, MOD A2C: 82.7 ml LV vol d, MOD A4C: 83.2 ml LV vol s, MOD A2C: 32.9 ml LV vol s, MOD A4C: 35.0 ml LV SV MOD A2C:     49.8 ml LV SV MOD A4C:     83.2 ml LV SV MOD BP:      48.3 ml RIGHT VENTRICLE RV S prime:     7.93 cm/s TAPSE (M-mode): 1.9 cm LEFT ATRIUM              Index        RIGHT ATRIUM           Index LA diam:        4.50 cm  2.35 cm/m   RA Area:     28.60 cm LA Vol (A2C):   108.0 ml 56.49 ml/m  RA Volume:   91.00 ml  47.59 ml/m LA Vol (A4C):   121.0 ml 63.28 ml/m LA Biplane Vol: 120.0 ml 62.76 ml/m  AORTIC VALVE LVOT Vmax:   106.33 cm/s LVOT Vmean:  64.500 cm/s LVOT VTI:    0.168 m  AORTA Ao Root diam: 4.20 cm Ao Asc diam:  4.10 cm TRICUSPID VALVE TR Peak grad:   9.5 mmHg TR Vmax:        154.00 cm/s  SHUNTS Systemic VTI:  0.17 m Systemic Diam: 2.30 cm Candee Furbish MD Electronically signed by Candee Furbish MD Signature Date/Time: 01/25/2022/12:52:11 PM    Final    CT ANGIO HEAD NECK W WO CM W PERF  Result Date: 01/25/2022 CLINICAL DATA:  Neuro deficit with acute stroke suspected. EXAM: CT ANGIOGRAPHY HEAD AND NECK CT PERFUSION BRAIN TECHNIQUE: Multidetector CT imaging of the head and neck was performed using the standard protocol during bolus administration of intravenous contrast. Multiplanar CT image reconstructions and MIPs were obtained to evaluate the vascular anatomy.  Carotid stenosis measurements (when applicable) are obtained utilizing NASCET criteria, using the distal internal carotid diameter as the denominator. Multiphase CT imaging of the brain was performed following IV bolus contrast injection. Subsequent parametric perfusion maps were calculated using RAPID software. RADIATION DOSE REDUCTION: This exam was performed according to the departmental dose-optimization program which includes automated exposure control, adjustment of the mA and/or kV according to patient size and/or use of iterative reconstruction technique. CONTRAST:  133m OMNIPAQUE IOHEXOL 350 MG/ML  SOLN COMPARISON:  Head CT from yesterday FINDINGS: CT HEAD FINDINGS Brain: Chronic small vessel ischemia with confluent biparietal white matter low-density and remote left occipital cortex infarct. Generalized brain atrophy with ventriculomegaly. Vascular: See below Skull: No acute finding Sinuses: Large retention cyst in the right maxillary sinus. Orbits: Negative Review of the MIP images confirms the above findings CTA NECK FINDINGS Aortic arch: Partial coverage is negative for acute finding or dilatation. Three vessel branching. Right carotid system: Atheromatous calcification at the bifurcation. No stenosis or ulceration. ICA tortuosity. Left carotid system: Mild atheromatous plaque for age. ICA tortuosity. No stenosis or ulceration Vertebral arteries: No proximal subclavian stenosis. Atheromatous plaque at the right vertebral origin. The vertebral arteries are smoothly contoured and widely patent when allowing for tortuosity accentuated by vertebral spurring. Skeleton: Advanced cervical spine degeneration with reversal of cervical lordosis. Other neck: No acute finding Upper chest: No acute finding Review of the MIP images confirms the above findings CTA HEAD FINDINGS Anterior circulation: Atheromatous calcification affecting the carotid siphons. Mild atheromatous irregularity of branch vessels. No branch occlusion, beading, or flow limiting stenosis. Negative for aneurysm. Posterior circulation: Vertebral and basilar tortuosity without stenosis. Fetal type right PCA. No branch occlusion, beading, or aneurysm. Venous sinuses: Unremarkable for the contrast phase Anatomic variants: As above CT Brain Perfusion Findings: Nondiagnostic due to motion artifact. No superimposed gross perfusion deficits. IMPRESSION: 1. No emergent finding. 2. Atherosclerosis without significant stenosis of major arteries in the head and neck. 3. Nondiagnostic CT perfusion due to motion. No evidence of superimposed large territory  deficit. Electronically Signed   By: JJorje GuildM.D.   On: 01/25/2022 04:44   DG Chest Portable 1 View  Result Date: 01/24/2022 CLINICAL DATA:  Altered mental status.  Cough. EXAM: PORTABLE CHEST 1 VIEW COMPARISON:  10/22/2020 FINDINGS: 1310 hours. Low volume film. The cardio pericardial silhouette is enlarged. The lungs are clear without focal pneumonia, edema, pneumothorax or pleural effusion. Bones are diffusely demineralized. Telemetry leads overlie the chest. IMPRESSION: Low volume film without acute cardiopulmonary findings. Electronically Signed   By: EMisty StanleyM.D.   On: 01/24/2022 13:25   CT HEAD CODE STROKE WO CONTRAST  Result Date: 01/24/2022 CLINICAL DATA:  Code stroke. Provided history: Neuro deficit, acute, stroke suspected. EXAM: CT HEAD WITHOUT CONTRAST TECHNIQUE: Contiguous axial images were obtained from the base of the skull through the vertex without intravenous contrast. RADIATION DOSE REDUCTION: This exam was performed according to the departmental dose-optimization program which includes automated exposure control, adjustment of the mA and/or kV according to patient size and/or use of iterative reconstruction technique. COMPARISON:  Prior head CT examinations 10/22/2020 and earlier. FINDINGS: Significantly motion degraded examination, limiting evaluation. Additionally, portions of the anteroinferior frontal lobes are excluded from the field of view (left greater than right). Brain: Moderate cerebral atrophy. Comparatively mild cerebellar atrophy. Moderate patchy and ill-defined hypoattenuation within the cerebral white matter, nonspecific but compatible with chronic small vessel disease. Known chronic cortical/subcortical left occipital lobe infarct (PCA vascular territory). Within the  limitations of significant motion degradation, no definite intracranial hemorrhage or acute demarcated cortical infarct is identified. No appreciable intracranial mass or extra-axial fluid  collection. No midline shift. Vascular: No hyperdense vessel. Atherosclerotic calcifications. Skull: No fracture or aggressive osseous lesion. Sinuses/Orbits: The orbits and the majority of the paranasal sinuses are excluded from the field of view. No appreciable significant paranasal sinus disease. ASPECTS ([place] Stroke Program Early CT Score) - Ganglionic level infarction (caudate, lentiform nuclei, internal capsule, insula, M1-M3 cortex): 7 - Supraganglionic infarction (M4-M6 cortex): 3 Total score (0-10 with 10 being normal): 10 Impressions #1 and #2 communicated to Dr. Leonie Man at 12:49 pmon 01/24/2022 by text page via the [facility] messaging system. IMPRESSION: 1. Examination significantly limited by motion degradation. Additionally, portions of the anteroinferior frontal lobes are excluded from the field of view (left greater than right). 2. Within this limitation, no definite acute intracranial abnormality is identified. 3. Known chronic left occipital lobe PCA territory infarct. 4. Moderate chronic small ischemic changes within the cerebral white matter. 5. Moderate cerebral atrophy. 6. Comparatively mild cerebellar atrophy. Electronically Signed   By: Kellie Simmering D.O.   On: 01/24/2022 12:52     PHYSICAL EXAM  Temp:  [98 F (36.7 C)-99.4 F (37.4 C)] 98.2 F (36.8 C) (07/09 0800) Pulse Rate:  [63-87] 87 (07/09 0800) Resp:  [20-36] 36 (07/09 0800) BP: (123-190)/(74-107) 178/107 (07/09 0800) SpO2:  [90 %-97 %] 97 % (07/09 0800)  General - Well nourished, well developed, not in acute distress  Ophthalmologic - fundi not visualized due to noncooperation.  Cardiovascular - irregularly irregular heart rate and rhythm.  Neuro - awake alert, eyes open, orientated to age, place, time. No aphasia, mild to dysarthria, following all simple commands. Able to name and repeat in dysarthric voice. No gaze palsy, tracking bilaterally, questionable right hemianopia. Mild left nasolabial fold flattening. Tongue  midline. Bilateral UEs 3/5, drift bilaterally. Bilaterally LE3-/5, toe movement 3/5. Sensation symmetrical bilaterally subjectively, b/l FTN difficulty to complete but no obvious ataxia, gait not tested.    ASSESSMENT/PLAN Mr. Lenard Kampf is a 76 y.o. male with history of hypertension, hyperlipidemia, diabetes, A-fib on Eliquis, left occipital stroke 2 years ago admitted for right facial droop and slurred speech. No tPA given due to outside window.    Encephalopathy, improved Waxing and waning mental status overnight Likely multifactorial - respiratory distress, COVID with coughing, UTI, ? Stroke, deconditioning, ? transient fever, and hypertensive  On rocephin  Ammonia level negative EEG moderate diffuse slowing, no seizure Continue monitoring Treat underlying condition  COVID infection Cough Frequent coughing, not able to have MRI done COVID PCR positive Intermittent slight hypoxia Management per primary team  ? Stroke - MRI pending Initially reported right facial droop and slurry, now seems to have mild left facial droop CT head old left PCA infarct CTA head and neck unremarkable MRI  pending 2D Echo  70-75% LDL 94 HgbA1c 6.5 UDS negative Eliquis  for VTE prophylaxis Eliquis (apixaban) daily prior to admission, now on Eliquis (apixaban) daily.  Ongoing aggressive stroke risk factor management Therapy recommendations:  SNF Disposition:  pending  Chronic A-fib Rate controlled On home Eliquis On home metoprolol  Diabetes HgbA1c 6.5, goal < 7.0 Controlled CBG monitoring SSI DM education and close PCP follow up  Hypertension Unstable, on high and On Norvasc 10, Lasix, metoprolol Long term BP goal normotensive  Hyperlipidemia Home meds: Pravastatin 40 LDL 94, goal < 70 Now on Crestor 20 Continue statin at discharge  Other Stroke Risk  Factors Advanced age Hx stroke/TIA - CT old left PCA infarct  Other Active Problems CKD 3A, creatinine  1.79->1.75 UTI, UA WBC 6-10, on Rocephin. Venous insufficiency, bilateral lower extremity swelling, on [facility] day # 0   Rosalin Hawking, MD PhD Stroke Neurology 01/26/2022 1:11 PM    To contact Stroke Continuity provider, please refer to [email or web address] After hours, contact General Neurology

## 2022-01-26 NOTE — Progress Notes (Signed)
PROGRESS NOTE    [name]  XQJ:[id number] DOB: 25-Dec-1945 DOA: 01/24/2022 PCP: No primary care provider on file.   Brief Narrative:  [name] is a 76 year old African-American male with prior history of diabetes, hypertension, hyperlipidemia, COPD, paroxysmal A-fib on anticoagulation with Eliquis and history of left occipital infarct 2 years ago.  Per his daughters report he wasn't himself 01/23/22, had a fall and has had urinary incontinence. He was last seen at [facility] 01/23/22. This AM his daughter reports he was dysarthric and had right sided weakness. EMS activated. His symptoms seemed to improve in transit t [facility].    ED Course: Code Stroke called by EMS in transit. Pt seen at the bridge by Dr. Leonie Man for neurology who reported that symptoms seemed to have cleared. He was probable outside the window for thrombolytics and symtoms were not c/w LVO event. Emergent CT head was negative. Vitals T 101.2  BP 158/106  HR 81  RR 22. Lab notable for glucose 106, Cr 1.79 (chronic CKD), CBCD nl, TSH nl, Troponin 94. CXR NAD, EKG w/ a. Fib at rate of 85, LVH, old anterior injury, question of recent inferior injury.    Neuro recommeds TRH admit to complete workup with MRI brain, MRA brain and neck, ECHO. In ED due to fever and abnl U/A 1G Rocephin administered. Appropriate cultures done.   Assessment & Plan:   Principal Problem:   CVA (cerebral vascular accident) ([place]) Active Problems:   Fever   Cough   Elevated troponin   Diabetes mellitus with neuropathy (HCC)   Hyperlipidemia with target LDL less than 100   Hypertensive heart disease with chronic diastolic congestive heart failure ([place])   Permanent atrial fibrillation ([place]) - now off amiodarone; asymptomatic. CHA2DS2-VASc Score -5 (on Xarelto)   Venous insufficiency (chronic) (peripheral) with chronic lower extremity edema   Chronic kidney disease, stage 3b (HCC)   HTN (hypertension)   Hypomagnesemia   COVID-19  Dysarthria/right-sided  weakness: Patient with multiple risk factors including DM, HTN,HLD, obesity. Symptoms were transient and improved by the time he arrived to the ED.  He was out of the window for tPA.  Admitted to hospital service.  Patient has no complaints and no focal deficit.  CT head negative.  MRI pending.  Echo shows 88 to 75% ejection fraction with no wall motion normality and no PFO..  PT OT evaluated patient and recommends SNF.  SLP on board as well.  Continue Eliquis.  MRI was ordered and 3 attempts were made to complete the MRI but reportedly patient had different issues each time which led to unsuccessful MRI completion, the issues included coughing spells or inability to lay flat due to kyphosis.  This happened despite of giving him Ativan as well as cough medications.  Neurology plans to attempt 1 more time at some point in time.   Sepsis secondary to UTI, POA: Patient meets criteria for sepsis due to fever of 101.2 as well as tachypnea and UTI.  Patient has remained afebrile for more than 24 hours now.  Continue Rocephin .  Apparently urine was never sent for culture.  It is too late to send now.   Elevated troponin/demand ischemia: Troponin elevated but essentially flat, echo ruled out wall motion abnormality.  Cough/incidental COVID-19 positive: Patient has been having some cough.  He was tested positive for COVID-19.  Per daughter, patient has not had any exposure other than the family members and all the family numbers are tested negative.  Chest x-ray negative  and he is not hypoxic.  Discussed with the daughter and started on molnupiravir.   HTN (hypertension): Blood pressure elevated.  Has been 2 days since his presenting symptoms.  No more permissive hypertension needed.  Will resume amlodipine continue as needed hydralazine.  Continue metoprolol.   Venous insufficiency (chronic) (peripheral) with chronic lower extremity edema/lymphedema Patient with chronic venous stasis dermatitis w/o open  lesions. Also mycotic nails. No need for additional therapy at this time.    Permanent atrial fibrillation (HCC) - now off amiodarone; asymptomatic. CHA2DS2-VASc Score -5 (on Xarelto).  On metoprolol.   Hypertensive heart disease with chronic diastolic congestive heart failure ([place]) Patient appears well compensated.  Echo shows LVH consistent with uncontrolled hypertension.  Hyperlipidemia: Now on Crestor.  Diabetes mellitus with neuropathy (HCC) Last A1C 2 months ago 6.6% and currently 6.5. No antiglycemic meds listed.  Blood sugar controlled on SSI.   CKD stage IIIb: At baseline.  Hypomagnesemia: Resolved.  Discharge planning: PT OT recommended SNF however patient is declining SNF.  He is fully alert and oriented.  I had a lengthy discussion with him.  I asked him to discuss with his daughters and also offered him that I will call them as well.  To that he says " iron them, they do not run me, it is my decision".  Despite of this, I encouraged him to have a discussion with the daughters.  I will also call them later.  DVT prophylaxis:    Code Status: Full Code  Family Communication: None present at bedside.  Plan of care discussed with patient in length and he/she verbalized understanding and agreed with it.  Status is: Inpatient Remains inpatient appropriate because: Needs MRI and placement to SNF.     Estimated body mass index is 18.99 kg/m as calculated from the following:   Height as of this encounter: '6\' 2"'$  (1.88 m).   Weight as of this encounter: 67.1 kg.  Pressure Injury 02/16/20 Sacrum Bilateral Stage 2 -  Partial thickness loss of dermis presenting as a shallow open injury with a red, pink wound bed without slough. (Active)  02/16/20 0400  Location: Sacrum  Location Orientation: Bilateral  Staging: Stage 2 -  Partial thickness loss of dermis presenting as a shallow open injury with a red, pink wound bed without slough.  Wound Description (Comments):   Present on  Admission: Yes     Pressure Injury 02/16/20 Right Stage 2 -  Partial thickness loss of dermis presenting as a shallow open injury with a red, pink wound bed without slough. (Active)  02/16/20 2200  Location:   Location Orientation: Right  Staging: Stage 2 -  Partial thickness loss of dermis presenting as a shallow open injury with a red, pink wound bed without slough.  Wound Description (Comments):   Present on Admission: Yes     Pressure Injury 02/16/20 Buttocks Right;Lateral Stage 2 -  Partial thickness loss of dermis presenting as a shallow open injury with a red, pink wound bed without slough. (Active)  02/16/20 2200  Location: Buttocks  Location Orientation: Right;Lateral  Staging: Stage 2 -  Partial thickness loss of dermis presenting as a shallow open injury with a red, pink wound bed without slough.  Wound Description (Comments):   Present on Admission: Yes   Nutritional Assessment: Body mass index is 18.99 kg/m.Marland Kitchen Seen by dietician.  I agree with the assessment and plan as outlined below: Nutrition Status:        . Skin Assessment: I  have examined the patient's skin and I agree with the wound assessment as performed by the wound care RN as outlined below: Pressure Injury 02/16/20 Sacrum Bilateral Stage 2 -  Partial thickness loss of dermis presenting as a shallow open injury with a red, pink wound bed without slough. (Active)  02/16/20 0400  Location: Sacrum  Location Orientation: Bilateral  Staging: Stage 2 -  Partial thickness loss of dermis presenting as a shallow open injury with a red, pink wound bed without slough.  Wound Description (Comments):   Present on Admission: Yes     Pressure Injury 02/16/20 Right Stage 2 -  Partial thickness loss of dermis presenting as a shallow open injury with a red, pink wound bed without slough. (Active)  02/16/20 2200  Location:   Location Orientation: Right  Staging: Stage 2 -  Partial thickness loss of dermis presenting as a  shallow open injury with a red, pink wound bed without slough.  Wound Description (Comments):   Present on Admission: Yes     Pressure Injury 02/16/20 Buttocks Right;Lateral Stage 2 -  Partial thickness loss of dermis presenting as a shallow open injury with a red, pink wound bed without slough. (Active)  02/16/20 2200  Location: Buttocks  Location Orientation: Right;Lateral  Staging: Stage 2 -  Partial thickness loss of dermis presenting as a shallow open injury with a red, pink wound bed without slough.  Wound Description (Comments):   Present on Admission: Yes    Consultants:  Neurology  Procedures:  None  Antimicrobials:  Anti-infectives (From admission, onward)    Start     Dose/Rate Route Frequency Ordered Stop   01/25/22 1700  cefTRIAXone (ROCEPHIN) 1 g in sodium chloride 0.9 % 100 mL IVPB        1 g 200 mL/hr over 30 Minutes Intravenous Every 24 hours 01/24/22 1829     01/25/22 1415  molnupiravir EUA (LAGEVRIO) capsule 800 mg        4 capsule Oral 2 times daily 01/25/22 1322 01/30/22 0959   01/25/22 1300  nirmatrelvir/ritonavir EUA (renal dosing) (PAXLOVID) 2 tablet  Status:  Discontinued        2 tablet Oral 2 times daily 01/25/22 1205 01/25/22 1321   01/24/22 1545  cefTRIAXone (ROCEPHIN) 1 g in sodium chloride 0.9 % 100 mL IVPB        1 g 200 mL/hr over 30 Minutes Intravenous  Once 01/24/22 1530 01/24/22 1717         Subjective: Patient seen and examined.  Daughter at the bedside.  Patient fully alert and oriented.  He has no complaints.  He believes that his daughter overreacted and is overly cautious and that is why she brought him to the emergency department.  He says that he has a lot of things wrong but not to the intensity that he needed to come to the hospital.  Objective: Vitals:   01/26/22 0355 01/26/22 0404 01/26/22 0522 01/26/22 0800  BP:  (!) 190/74 123/82 (!) 178/107  Pulse:      Resp:      Temp: 98.9 F (37.2 C)   98.2 F (36.8 C)  TempSrc:  Oral   Oral  SpO2:      Weight:      Height:        Intake/Output Summary (Last 24 hours) at 01/26/2022 1015 Last data filed at 01/26/2022 0620 Gross per 24 hour  Intake 729.48 ml  Output 800 ml  Net -70.52 ml  Filed Weights   01/24/22 2102  Weight: 67.1 kg    Examination:  General exam: Appears calm and comfortable, obese Respiratory system: Decreased breath sounds due to poor inspiratory effort. Cardiovascular system: S1 & S2 heard, RRR. No JVD, murmurs, rubs, gallops or clicks.  +3 pitting edema/lymphedema bilateral lower extremities Gastrointestinal system: Abdomen is nondistended, soft and nontender. No organomegaly or masses felt. Normal bowel sounds heard. Central nervous system: Alert and oriented.  Decreased strength in all 4 extremities. Extremities: Symmetric 5 x 5 power. Skin: No rashes, lesions or ulcers.   Data Reviewed: I have personally reviewed following labs and imaging studies  CBC: Recent Labs  Lab 01/24/22 1300  WBC 6.8  NEUTROABS 4.8  HGB 13.3  HCT 42.5  MCV 86.4  PLT 458    Basic Metabolic Panel: Recent Labs  Lab 01/24/22 1440 01/25/22 0224 01/26/22 0057  NA 135  --  135  K 4.3  --  3.8  CL 102  --  105  CO2 20*  --  21*  GLUCOSE 106*  --  95  BUN 20  --  19  CREATININE 1.79*  --  1.75*  CALCIUM 8.9  --  7.9*  MG  --  1.5* 1.9    GFR: Estimated Creatinine Clearance: 34.6 mL/min (A) (by C-G formula based on SCr of 1.75 mg/dL (H)). Liver Function Tests: Recent Labs  Lab 01/24/22 1440  AST 28  ALT 12  ALKPHOS 132*  BILITOT 0.7  PROT 8.8*  ALBUMIN 2.6*    No results for input(s): "LIPASE", "AMYLASE" in the last 168 hours. Recent Labs  Lab 01/24/22 1440  AMMONIA 21    Coagulation Profile: Recent Labs  Lab 01/24/22 1433  INR 1.2    Cardiac Enzymes: No results for input(s): "CKTOTAL", "CKMB", "CKMBINDEX", "TROPONINI" in the last 168 hours. BNP (last 3 results) No results for input(s): "PROBNP" in the last 8760  hours. HbA1C: Recent Labs    01/26/22 0057  HGBA1C 6.5*   CBG: Recent Labs  Lab 01/25/22 0916 01/25/22 1239 01/25/22 1710 01/25/22 2211 01/26/22 0615  GLUCAP 100* 98 90 92 98    Lipid Profile: Recent Labs    01/25/22 0224  CHOL 145  HDL 39*  LDLCALC 94  TRIG 61  CHOLHDL 3.7    Thyroid Function Tests: Recent Labs    01/24/22 1249  TSH 1.363    Anemia Panel: No results for input(s): "VITAMINB12", "FOLATE", "FERRITIN", "TIBC", "IRON", "RETICCTPCT" in the last 72 hours. Sepsis Labs: Recent Labs  Lab 01/24/22 1440 01/24/22 1825  LATICACIDVEN 3.0* 2.4*     Recent Results (from the past 240 hour(s))  Resp Panel by RT-PCR (Flu A&B, Covid) Anterior Nasal Swab     Status: Abnormal   Collection Time: 01/24/22 12:26 PM   Specimen: Anterior Nasal Swab  Result Value Ref Range Status   SARS Coronavirus 2 by RT PCR POSITIVE (A) NEGATIVE Final    Comment: (NOTE) SARS-CoV-2 target nucleic acids are DETECTED.  The SARS-CoV-2 RNA is generally detectable in upper respiratory specimens during the acute phase of infection. Positive results are indicative of the presence of the identified virus, but do not rule out bacterial infection or co-infection with other pathogens not detected by the test. Clinical correlation with patient history and other diagnostic information is necessary to determine patient infection status. The expected result is Negative.  Fact Sheet for Patients: EntrepreneurPulse.com.au  Fact Sheet for Healthcare Providers: IncredibleEmployment.be  This test is not yet approved or cleared  by the [place] and  has been authorized for detection and/or diagnosis of SARS-CoV-2 by FDA under an Emergency Use Authorization (EUA).  This EUA will remain in effect (meaning this test can be used) for the duration of  the COVID-19 declaration under Section 564(b)(1) of the A ct, 21 U.S.C. section 360bbb-3(b)(1),  unless the authorization is terminated or revoked sooner.     Influenza A by PCR NEGATIVE NEGATIVE Final   Influenza B by PCR NEGATIVE NEGATIVE Final    Comment: (NOTE) The Xpert Xpress SARS-CoV-2/FLU/RSV plus assay is intended as an aid in the diagnosis of influenza from Nasopharyngeal swab specimens and should not be used as a sole basis for treatment. Nasal washings and aspirates are unacceptable for Xpert Xpress SARS-CoV-2/FLU/RSV testing.  Fact Sheet for Patients: EntrepreneurPulse.com.au  Fact Sheet for Healthcare Providers: IncredibleEmployment.be  This test is not yet approved or cleared by the [place] FDA and has been authorized for detection and/or diagnosis of SARS-CoV-2 by FDA under an Emergency Use Authorization (EUA). This EUA will remain in effect (meaning this test can be used) for the duration of the COVID-19 declaration under Section 564(b)(1) of the Act, 21 U.S.C. section 360bbb-3(b)(1), unless the authorization is terminated or revoked.  Performed at [facility], [place] [address] [place], [place] [address]   Blood culture (routine x 2)     Status: None (Preliminary result)   Collection Time: 01/24/22  2:33 PM   Specimen: BLOOD RIGHT HAND  Result Value Ref Range Status   Specimen Description BLOOD RIGHT HAND  Final   Special Requests   Final    BOTTLES DRAWN AEROBIC AND ANAEROBIC Blood Culture results may not be optimal due to an inadequate volume of blood received in culture bottles   Culture   Final    NO GROWTH 2 DAYS Performed at [facility], [place] [address] [place], [place] [address]    Report Status PENDING  Incomplete  Blood culture (routine x 2)     Status: None (Preliminary result)   Collection Time: 2022-01-29  2:38 AM   Specimen: BLOOD RIGHT FOREARM  Result Value Ref Range Status   Specimen Description BLOOD RIGHT FOREARM  Final   Special Requests   Final    BOTTLES DRAWN AEROBIC ONLY  Blood Culture adequate volume   Culture   Final    NO GROWTH 1 DAY Performed at [facility], [place] [address] [place],  [address]    Report Status PENDING  Incomplete     Radiology Studies: EEG adult  Result Date: 01-29-22 Parker Jack, MD     01-29-2022  7:02 PM Routine EEG Report [name] is a 76 y.o. male with a history of prior stroke and waxing/waning mental status who is undergoing an EEG to evaluate for seizures. Report: This EEG was acquired with electrodes placed according to the International 10-20 electrode system (including Fp1, Fp2, F3, F4, C3, C4, P3, P4, O1, O2, T3, T4, T5, T6, A1, A2, Fz, Cz, Pz). The following electrodes were missing or displaced: none. The occipital dominant rhythm was 5-6 Hz with intermittent more severe slowing diffusely. This activity is reactive to stimulation. Drowsiness was manifested by background fragmentation; deeper stages of sleep were not identified. There was no focal slowing. There were no interictal epileptiform discharges. There were no electrographic seizures identified. Photic stimulation and hyperventilation were not performed. Impression and clinical correlation: This EEG was obtained while awake and drowsy and is  abnormal due to moderate diffuse slowing indicative of global cerebral dysfunction. Epileptiform abnormalities were not seen during this recording. Parker Monks, MD [facility] [phone] If 7pm- 7am, please page neurology on call as listed in [place].   ECHOCARDIOGRAM COMPLETE  Result Date: 01/25/2022    ECHOCARDIOGRAM REPORT   Patient Name:   [name] Date of Exam: 01/25/2022 Medical Rec #:  [id number]           Height:       74.0 in Accession #:    [id number]          Weight:       147.9 lb Date of Birth:  1946/05/11           BSA:          1.912 m Patient Age:    47 years            BP:           174/78 mmHg Patient Gender: M                   HR:           68 bpm. Exam Location:   Inpatient Procedure: 2D Echo, Cardiac Doppler and Color Doppler Indications:    Stroke I63.9  History:        Patient has prior history of Echocardiogram examinations, most                 recent 02/17/2020. COPD, Arrythmias:Atrial Fibrillation; Risk                 Factors:Hypertension, Dyslipidemia, Diabetes and ETOH.  Sonographer:    Bernadene Person RDCS Referring Phys: [place]  1. Left ventricular ejection fraction, by estimation, is 70 to 75%. The left ventricle has hyperdynamic function. The left ventricle has no regional wall motion abnormalities. There is mild left ventricular hypertrophy. Left ventricular diastolic parameters are indeterminate.  2. Right ventricular systolic function is normal. The right ventricular size is mildly enlarged. There is normal pulmonary artery systolic pressure. The estimated right ventricular systolic pressure is 29.5 mmHg.  3. Left atrial size was severely dilated.  4. Right atrial size was moderately dilated.  5. The mitral valve is normal in structure. No evidence of mitral valve regurgitation. No evidence of mitral stenosis.  6. The aortic valve is normal in structure. Aortic valve regurgitation is not visualized. No aortic stenosis is present.  7. Aortic dilatation noted. There is mild dilatation of the aortic root, measuring 42 mm. There is mild dilatation of the ascending aorta, measuring 41 mm.  8. The inferior vena cava is dilated in size with >50% respiratory variability, suggesting right atrial pressure of 8 mmHg. Comparison(s): Prior images reviewed side by side. Conclusion(s)/Recommendation(s): No intracardiac source of embolism detected on this transthoracic study. Consider a transesophageal echocardiogram to exclude cardiac source of embolism if clinically indicated. FINDINGS  Left Ventricle: Left ventricular ejection fraction, by estimation, is 70 to 75%. The left ventricle has hyperdynamic function. The left ventricle has no regional  wall motion abnormalities. The left ventricular internal cavity size was normal in size. There is mild left ventricular hypertrophy. Left ventricular diastolic parameters are indeterminate. Right Ventricle: The right ventricular size is mildly enlarged. No increase in right ventricular wall thickness. Right ventricular systolic function is normal. There is normal pulmonary artery systolic pressure. The tricuspid regurgitant velocity is 1.54  m/s, and with an assumed right atrial pressure of 8 mmHg,  the estimated right ventricular systolic pressure is 68.1 mmHg. Left Atrium: Left atrial size was severely dilated. Right Atrium: Right atrial size was moderately dilated. Pericardium: There is no evidence of pericardial effusion. Mitral Valve: The mitral valve is normal in structure. No evidence of mitral valve regurgitation. No evidence of mitral valve stenosis. Tricuspid Valve: The tricuspid valve is normal in structure. Tricuspid valve regurgitation is trivial. No evidence of tricuspid stenosis. Aortic Valve: The aortic valve is normal in structure. Aortic valve regurgitation is not visualized. No aortic stenosis is present. Pulmonic Valve: The pulmonic valve was normal in structure. Pulmonic valve regurgitation is trivial. No evidence of pulmonic stenosis. Aorta: Aortic dilatation noted. There is mild dilatation of the aortic root, measuring 42 mm. There is mild dilatation of the ascending aorta, measuring 41 mm. Venous: The inferior vena cava is dilated in size with greater than 50% respiratory variability, suggesting right atrial pressure of 8 mmHg. IAS/Shunts: No atrial level shunt detected by color flow Doppler.  LEFT VENTRICLE PLAX 2D LVIDd:         4.10 cm LVIDs:         2.40 cm LV PW:         1.30 cm LV IVS:        1.40 cm LVOT diam:     2.30 cm LV SV:         70 LV SV Index:   37 LVOT Area:     4.15 cm  LV Volumes (MOD) LV vol d, MOD A2C: 82.7 ml LV vol d, MOD A4C: 83.2 ml LV vol s, MOD A2C: 32.9 ml LV vol  s, MOD A4C: 35.0 ml LV SV MOD A2C:     49.8 ml LV SV MOD A4C:     83.2 ml LV SV MOD BP:      48.3 ml RIGHT VENTRICLE RV S prime:     7.93 cm/s TAPSE (M-mode): 1.9 cm LEFT ATRIUM              Index        RIGHT ATRIUM           Index LA diam:        4.50 cm  2.35 cm/m   RA Area:     28.60 cm LA Vol (A2C):   108.0 ml 56.49 ml/m  RA Volume:   91.00 ml  47.59 ml/m LA Vol (A4C):   121.0 ml 63.28 ml/m LA Biplane Vol: 120.0 ml 62.76 ml/m  AORTIC VALVE LVOT Vmax:   106.33 cm/s LVOT Vmean:  64.500 cm/s LVOT VTI:    0.168 m  AORTA Ao Root diam: 4.20 cm Ao Asc diam:  4.10 cm TRICUSPID VALVE TR Peak grad:   9.5 mmHg TR Vmax:        154.00 cm/s  SHUNTS Systemic VTI:  0.17 m Systemic Diam: 2.30 cm Parker Furbish MD Electronically signed by Parker Furbish MD Signature Date/Time: 01/25/2022/12:52:11 PM    Final    CT ANGIO HEAD NECK W WO CM W PERF  Result Date: 01/25/2022 CLINICAL DATA:  Neuro deficit with acute stroke suspected. EXAM: CT ANGIOGRAPHY HEAD AND NECK CT PERFUSION BRAIN TECHNIQUE: Multidetector CT imaging of the head and neck was performed using the standard protocol during bolus administration of intravenous contrast. Multiplanar CT image reconstructions and MIPs were obtained to evaluate the vascular anatomy. Carotid stenosis measurements (when applicable) are obtained utilizing NASCET criteria, using the distal internal carotid diameter as the denominator. Multiphase CT imaging of  the brain was performed following IV bolus contrast injection. Subsequent parametric perfusion maps were calculated using RAPID software. RADIATION DOSE REDUCTION: This exam was performed according to the departmental dose-optimization program which includes automated exposure control, adjustment of the mA and/or kV according to patient size and/or use of iterative reconstruction technique. CONTRAST:  198m OMNIPAQUE IOHEXOL 350 MG/ML SOLN COMPARISON:  Head CT from yesterday FINDINGS: CT HEAD FINDINGS Brain: Chronic small vessel ischemia  with confluent biparietal white matter low-density and remote left occipital cortex infarct. Generalized brain atrophy with ventriculomegaly. Vascular: See below Skull: No acute finding Sinuses: Large retention cyst in the right maxillary sinus. Orbits: Negative Review of the MIP images confirms the above findings CTA NECK FINDINGS Aortic arch: Partial coverage is negative for acute finding or dilatation. Three vessel branching. Right carotid system: Atheromatous calcification at the bifurcation. No stenosis or ulceration. ICA tortuosity. Left carotid system: Mild atheromatous plaque for age. ICA tortuosity. No stenosis or ulceration Vertebral arteries: No proximal subclavian stenosis. Atheromatous plaque at the right vertebral origin. The vertebral arteries are smoothly contoured and widely patent when allowing for tortuosity accentuated by vertebral spurring. Skeleton: Advanced cervical spine degeneration with reversal of cervical lordosis. Other neck: No acute finding Upper chest: No acute finding Review of the MIP images confirms the above findings CTA HEAD FINDINGS Anterior circulation: Atheromatous calcification affecting the carotid siphons. Mild atheromatous irregularity of branch vessels. No branch occlusion, beading, or flow limiting stenosis. Negative for aneurysm. Posterior circulation: Vertebral and basilar tortuosity without stenosis. Fetal type right PCA. No branch occlusion, beading, or aneurysm. Venous sinuses: Unremarkable for the contrast phase Anatomic variants: As above CT Brain Perfusion Findings: Nondiagnostic due to motion artifact. No superimposed gross perfusion deficits. IMPRESSION: 1. No emergent finding. 2. Atherosclerosis without significant stenosis of major arteries in the head and neck. 3. Nondiagnostic CT perfusion due to motion. No evidence of superimposed large territory deficit. Electronically Signed   By: Parker GuildM.D.   On: 01/25/2022 04:44   DG Chest Portable 1  View  Result Date: 01/24/2022 CLINICAL DATA:  Altered mental status.  Cough. EXAM: PORTABLE CHEST 1 VIEW COMPARISON:  10/22/2020 FINDINGS: 1310 hours. Low volume film. The cardio pericardial silhouette is enlarged. The lungs are clear without focal pneumonia, edema, pneumothorax or pleural effusion. Bones are diffusely demineralized. Telemetry leads overlie the chest. IMPRESSION: Low volume film without acute cardiopulmonary findings. Electronically Signed   By: Parker StanleyM.D.   On: 01/24/2022 13:25   CT HEAD CODE STROKE WO CONTRAST  Result Date: 01/24/2022 CLINICAL DATA:  Code stroke. Provided history: Neuro deficit, acute, stroke suspected. EXAM: CT HEAD WITHOUT CONTRAST TECHNIQUE: Contiguous axial images were obtained from the base of the skull through the vertex without intravenous contrast. RADIATION DOSE REDUCTION: This exam was performed according to the departmental dose-optimization program which includes automated exposure control, adjustment of the mA and/or kV according to patient size and/or use of iterative reconstruction technique. COMPARISON:  Prior head CT examinations 10/22/2020 and earlier. FINDINGS: Significantly motion degraded examination, limiting evaluation. Additionally, portions of the anteroinferior frontal lobes are excluded from the field of view (left greater than right). Brain: Moderate cerebral atrophy. Comparatively mild cerebellar atrophy. Moderate patchy and ill-defined hypoattenuation within the cerebral white matter, nonspecific but compatible with chronic small vessel disease. Known chronic cortical/subcortical left occipital lobe infarct (PCA vascular territory). Within the limitations of significant motion degradation, no definite intracranial hemorrhage or acute demarcated cortical infarct is identified. No appreciable intracranial mass or extra-axial fluid  collection. No midline shift. Vascular: No hyperdense vessel. Atherosclerotic calcifications. Skull: No fracture  or aggressive osseous lesion. Sinuses/Orbits: The orbits and the majority of the paranasal sinuses are excluded from the field of view. No appreciable significant paranasal sinus disease. ASPECTS ([place] Stroke Program Early CT Score) - Ganglionic level infarction (caudate, lentiform nuclei, internal capsule, insula, M1-M3 cortex): 7 - Supraganglionic infarction (M4-M6 cortex): 3 Total score (0-10 with 10 being normal): 10 Impressions #1 and #2 communicated to Dr. Leonie Man at 12:49 pmon 01/24/2022 by text page via the [facility] messaging system. IMPRESSION: 1. Examination significantly limited by motion degradation. Additionally, portions of the anteroinferior frontal lobes are excluded from the field of view (left greater than right). 2. Within this limitation, no definite acute intracranial abnormality is identified. 3. Known chronic left occipital lobe PCA territory infarct. 4. Moderate chronic small ischemic changes within the cerebral white matter. 5. Moderate cerebral atrophy. 6. Comparatively mild cerebellar atrophy. Electronically Signed   By: [name] D.O.   On: 01/24/2022 12:52    Scheduled Meds:  apixaban  5 mg Oral BID   furosemide  40 mg Oral Daily   insulin aspart  0-20 Units Subcutaneous TID WC   metoprolol tartrate  12.5 mg Oral BID   molnupiravir EUA  4 capsule Oral BID   potassium chloride  10 mEq Oral Daily   rosuvastatin  20 mg Oral Daily   Continuous Infusions:  sodium chloride 75 mL/hr at 01/26/22 0517   cefTRIAXone (ROCEPHIN)  IV 1 g (01/25/22 1751)     LOS: 0 days   Darliss Cheney, MD [facility]  01/26/2022, 10:15 AM   *Please note that this is a verbal dictation therefore any spelling or grammatical errors are due to the "[place] [facility]" system interpretation.  Please page via [place] and do not message via secure chat for urgent patient care matters. Secure chat can be used for non urgent patient care matters.  How to contact the [facility] Attending or Consulting  provider [place] or covering provider during after hours [place], for this patient?  Check the care team in [facility] and look for a) attending/consulting [facility] provider listed and b) the [facility] team listed. Page or secure chat [phone]. Log into [email or web address] and use 's universal password to access. If you do not have the password, please contact the hospital operator. Locate the [facility] provider you are looking for under [facility] and page to a number that you can be directly reached. If you still have difficulty reaching the provider, please page the [facility] (Director on Call) for the Hospitalists listed on amion for assistance.

## 2022-01-27 ENCOUNTER — Ambulatory Visit: Payer: Medicare Other | Admitting: Physical Therapy

## 2022-01-27 ENCOUNTER — Inpatient Hospital Stay (HOSPITAL_COMMUNITY): Payer: Medicare Other

## 2022-01-27 DIAGNOSIS — N1832 Chronic kidney disease, stage 3b: Secondary | ICD-10-CM | POA: Diagnosis not present

## 2022-01-27 DIAGNOSIS — U071 COVID-19: Secondary | ICD-10-CM | POA: Diagnosis not present

## 2022-01-27 DIAGNOSIS — E785 Hyperlipidemia, unspecified: Secondary | ICD-10-CM

## 2022-01-27 DIAGNOSIS — I639 Cerebral infarction, unspecified: Secondary | ICD-10-CM | POA: Diagnosis not present

## 2022-01-27 DIAGNOSIS — R059 Cough, unspecified: Secondary | ICD-10-CM | POA: Diagnosis not present

## 2022-01-27 LAB — BASIC METABOLIC PANEL
Anion gap: 8 (ref 5–15)
BUN: 14 mg/dL (ref 8–23)
CO2: 21 mmol/L — ABNORMAL LOW (ref 22–32)
Calcium: 8 mg/dL — ABNORMAL LOW (ref 8.9–10.3)
Chloride: 105 mmol/L (ref 98–111)
Creatinine, Ser: 1.51 mg/dL — ABNORMAL HIGH (ref 0.61–1.24)
GFR, Estimated: 48 mL/min — ABNORMAL LOW (ref >60)
Glucose, Bld: 106 mg/dL — ABNORMAL HIGH (ref 70–99)
Potassium: 3.5 mmol/L (ref 3.5–5.1)
Sodium: 134 mmol/L — ABNORMAL LOW (ref 135–145)

## 2022-01-27 LAB — GLUCOSE, CAPILLARY
Glucose-Capillary: 106 mg/dL — ABNORMAL HIGH (ref 70–99)
Glucose-Capillary: 117 mg/dL — ABNORMAL HIGH (ref 70–99)
Glucose-Capillary: 102 mg/dL — ABNORMAL HIGH (ref 70–99)
Glucose-Capillary: 109 mg/dL — ABNORMAL HIGH (ref 70–99)

## 2022-01-27 LAB — MAGNESIUM: Magnesium: 1.7 mg/dL (ref 1.7–2.4)

## 2022-01-27 MED ORDER — MAGNESIUM SULFATE 2 GM/50ML IV SOLN
2.0000 g | Freq: Once | INTRAVENOUS | Status: AC
Start: 2022-01-27 — End: 2022-01-27
  Administered 2022-01-27: 2 g via INTRAVENOUS
  Filled 2022-01-27: qty 50

## 2022-01-27 MED ORDER — POTASSIUM CHLORIDE CRYS ER 20 MEQ PO TBCR
40.0000 meq | EXTENDED_RELEASE_TABLET | Freq: Once | ORAL | Status: AC
  Administered 2022-01-27: 40 meq via ORAL
  Filled 2022-01-27: qty 2

## 2022-01-27 NOTE — Progress Notes (Signed)
STROKE TEAM PROGRESS NOTE   SUBJECTIVE (INTERVAL HISTORY) NT is at the bedside. Pt lying in bed, eyes open, orientated. But RN told me that he had some hallucination this morning and stated somebody was trying to break in the window. Attempted MRI last night but not tolerating, will do CT repeat.    OBJECTIVE Temp:  [97.9 F (36.6 C)-99.1 F (37.3 C)] 98 F (36.7 C) (07/10 1302) Pulse Rate:  [62-105] 62 (07/10 1302) Cardiac Rhythm: Atrial fibrillation;Bundle branch block (07/10 0900) Resp:  [14-22] 21 (07/10 1302) BP: (135-181)/(83-121) 135/95 (07/10 1302) SpO2:  [95 %-99 %] 99 % (07/10 1302)  Recent Labs  Lab 01/26/22 0615 01/26/22 1248 01/26/22 1634 01/26/22 2032 01/27/22 0619  GLUCAP 98 141* 88 124* 106*   Recent Labs  Lab 01/24/22 1440 01/25/22 0224 01/26/22 0057  NA 135  --  135  K 4.3  --  3.8  CL 102  --  105  CO2 20*  --  21*  GLUCOSE 106*  --  95  BUN 20  --  19  CREATININE 1.79*  --  1.75*  CALCIUM 8.9  --  7.9*  MG  --  1.5* 1.9   Recent Labs  Lab 01/24/22 1440  AST 28  ALT 12  ALKPHOS 132*  BILITOT 0.7  PROT 8.8*  ALBUMIN 2.6*   Recent Labs  Lab 01/24/22 1300  WBC 6.8  NEUTROABS 4.8  HGB 13.3  HCT 42.5  MCV 86.4  PLT 181   No results for input(s): "CKTOTAL", "CKMB", "CKMBINDEX", "TROPONINI" in the last 168 hours. Recent Labs    01/24/22 1433  LABPROT 15.3*  INR 1.2   Recent Labs    01/24/22 1547  COLORURINE AMBER*  LABSPEC 1.024  PHURINE 6.0  GLUCOSEU NEGATIVE  HGBUR SMALL*  BILIRUBINUR NEGATIVE  KETONESUR NEGATIVE  PROTEINUR >=300*  NITRITE NEGATIVE  LEUKOCYTESUR TRACE*       Component Value Date/Time   CHOL 145 01/25/2022 0224   CHOL 142 08/23/2020 1504   TRIG 61 01/25/2022 0224   HDL 39 (L) 01/25/2022 0224   HDL 41 08/23/2020 1504   CHOLHDL 3.7 01/25/2022 0224   VLDL 12 01/25/2022 0224   LDLCALC 94 01/25/2022 0224   LDLCALC 88 08/23/2020 1504   Lab Results  Component Value Date   HGBA1C 6.5 (H) 01/26/2022       Component Value Date/Time   LABOPIA NONE DETECTED 01/24/2022 1547   COCAINSCRNUR NONE DETECTED 01/24/2022 1547   LABBENZ NONE DETECTED 01/24/2022 1547   AMPHETMU NONE DETECTED 01/24/2022 1547   THCU NONE DETECTED 01/24/2022 1547   LABBARB NONE DETECTED 01/24/2022 1547    Recent Labs  Lab 01/24/22 1440  ETH <10    I have personally reviewed the radiological images below and agree with the radiology interpretations.  EEG adult  Result Date: 01/25/2022 Derek Jack, MD     01/25/2022  7:02 PM Routine EEG Report Kelvis Saputo is a 76 y.o. male with a history of prior stroke and waxing/waning mental status who is undergoing an EEG to evaluate for seizures. Report: This EEG was acquired with electrodes placed according to the International 10-20 electrode system (including Fp1, Fp2, F3, F4, C3, C4, P3, P4, O1, O2, T3, T4, T5, T6, A1, A2, Fz, Cz, Pz). The following electrodes were missing or displaced: none. The occipital dominant rhythm was 5-6 Hz with intermittent more severe slowing diffusely. This activity is reactive to stimulation. Drowsiness was manifested by background fragmentation; deeper stages of  sleep were not identified. There was no focal slowing. There were no interictal epileptiform discharges. There were no electrographic seizures identified. Photic stimulation and hyperventilation were not performed. Impression and clinical correlation: This EEG was obtained while awake and drowsy and is abnormal due to moderate diffuse slowing indicative of global cerebral dysfunction. Epileptiform abnormalities were not seen during this recording. Su Monks, MD [facility] [phone] If 7pm- 7am, please page neurology on call as listed in [place].   ECHOCARDIOGRAM COMPLETE  Result Date: 01/25/2022    ECHOCARDIOGRAM REPORT   Patient Name:   KENDALE REMBOLD Date of Exam: 01/25/2022 Medical Rec #:  [id number]           Height:       74.0 in Accession #:    [id number]           Weight:       147.9 lb Date of Birth:  10/02/1945           BSA:          1.912 m Patient Age:    72 years            BP:           174/78 mmHg Patient Gender: M                   HR:           68 bpm. Exam Location:  Inpatient Procedure: 2D Echo, Cardiac Doppler and Color Doppler Indications:    Stroke I63.9  History:        Patient has prior history of Echocardiogram examinations, most                 recent 02/17/2020. COPD, Arrythmias:Atrial Fibrillation; Risk                 Factors:Hypertension, Dyslipidemia, Diabetes and ETOH.  Sonographer:    Bernadene Person RDCS Referring Phys: [place]  1. Left ventricular ejection fraction, by estimation, is 70 to 75%. The left ventricle has hyperdynamic function. The left ventricle has no regional wall motion abnormalities. There is mild left ventricular hypertrophy. Left ventricular diastolic parameters are indeterminate.  2. Right ventricular systolic function is normal. The right ventricular size is mildly enlarged. There is normal pulmonary artery systolic pressure. The estimated right ventricular systolic pressure is 53.6 mmHg.  3. Left atrial size was severely dilated.  4. Right atrial size was moderately dilated.  5. The mitral valve is normal in structure. No evidence of mitral valve regurgitation. No evidence of mitral stenosis.  6. The aortic valve is normal in structure. Aortic valve regurgitation is not visualized. No aortic stenosis is present.  7. Aortic dilatation noted. There is mild dilatation of the aortic root, measuring 42 mm. There is mild dilatation of the ascending aorta, measuring 41 mm.  8. The inferior vena cava is dilated in size with >50% respiratory variability, suggesting right atrial pressure of 8 mmHg. Comparison(s): Prior images reviewed side by side. Conclusion(s)/Recommendation(s): No intracardiac source of embolism detected on this transthoracic study. Consider a transesophageal echocardiogram to exclude  cardiac source of embolism if clinically indicated. FINDINGS  Left Ventricle: Left ventricular ejection fraction, by estimation, is 70 to 75%. The left ventricle has hyperdynamic function. The left ventricle has no regional wall motion abnormalities. The left ventricular internal cavity size was normal in size. There is mild left ventricular hypertrophy. Left ventricular diastolic parameters are indeterminate. Right Ventricle: The right ventricular  size is mildly enlarged. No increase in right ventricular wall thickness. Right ventricular systolic function is normal. There is normal pulmonary artery systolic pressure. The tricuspid regurgitant velocity is 1.54  m/s, and with an assumed right atrial pressure of 8 mmHg, the estimated right ventricular systolic pressure is 31.5 mmHg. Left Atrium: Left atrial size was severely dilated. Right Atrium: Right atrial size was moderately dilated. Pericardium: There is no evidence of pericardial effusion. Mitral Valve: The mitral valve is normal in structure. No evidence of mitral valve regurgitation. No evidence of mitral valve stenosis. Tricuspid Valve: The tricuspid valve is normal in structure. Tricuspid valve regurgitation is trivial. No evidence of tricuspid stenosis. Aortic Valve: The aortic valve is normal in structure. Aortic valve regurgitation is not visualized. No aortic stenosis is present. Pulmonic Valve: The pulmonic valve was normal in structure. Pulmonic valve regurgitation is trivial. No evidence of pulmonic stenosis. Aorta: Aortic dilatation noted. There is mild dilatation of the aortic root, measuring 42 mm. There is mild dilatation of the ascending aorta, measuring 41 mm. Venous: The inferior vena cava is dilated in size with greater than 50% respiratory variability, suggesting right atrial pressure of 8 mmHg. IAS/Shunts: No atrial level shunt detected by color flow Doppler.  LEFT VENTRICLE PLAX 2D LVIDd:         4.10 cm LVIDs:         2.40 cm LV PW:          1.30 cm LV IVS:        1.40 cm LVOT diam:     2.30 cm LV SV:         70 LV SV Index:   37 LVOT Area:     4.15 cm  LV Volumes (MOD) LV vol d, MOD A2C: 82.7 ml LV vol d, MOD A4C: 83.2 ml LV vol s, MOD A2C: 32.9 ml LV vol s, MOD A4C: 35.0 ml LV SV MOD A2C:     49.8 ml LV SV MOD A4C:     83.2 ml LV SV MOD BP:      48.3 ml RIGHT VENTRICLE RV S prime:     7.93 cm/s TAPSE (M-mode): 1.9 cm LEFT ATRIUM              Index        RIGHT ATRIUM           Index LA diam:        4.50 cm  2.35 cm/m   RA Area:     28.60 cm LA Vol (A2C):   108.0 ml 56.49 ml/m  RA Volume:   91.00 ml  47.59 ml/m LA Vol (A4C):   121.0 ml 63.28 ml/m LA Biplane Vol: 120.0 ml 62.76 ml/m  AORTIC VALVE LVOT Vmax:   106.33 cm/s LVOT Vmean:  64.500 cm/s LVOT VTI:    0.168 m  AORTA Ao Root diam: 4.20 cm Ao Asc diam:  4.10 cm TRICUSPID VALVE TR Peak grad:   9.5 mmHg TR Vmax:        154.00 cm/s  SHUNTS Systemic VTI:  0.17 m Systemic Diam: 2.30 cm Candee Furbish MD Electronically signed by Candee Furbish MD Signature Date/Time: 01/25/2022/12:52:11 PM    Final    CT ANGIO HEAD NECK W WO CM W PERF  Result Date: 01/25/2022 CLINICAL DATA:  Neuro deficit with acute stroke suspected. EXAM: CT ANGIOGRAPHY HEAD AND NECK CT PERFUSION BRAIN TECHNIQUE: Multidetector CT imaging of the head and neck was performed using the standard protocol  during bolus administration of intravenous contrast. Multiplanar CT image reconstructions and MIPs were obtained to evaluate the vascular anatomy. Carotid stenosis measurements (when applicable) are obtained utilizing NASCET criteria, using the distal internal carotid diameter as the denominator. Multiphase CT imaging of the brain was performed following IV bolus contrast injection. Subsequent parametric perfusion maps were calculated using RAPID software. RADIATION DOSE REDUCTION: This exam was performed according to the departmental dose-optimization program which includes automated exposure control, adjustment of the mA and/or kV  according to patient size and/or use of iterative reconstruction technique. CONTRAST:  119m OMNIPAQUE IOHEXOL 350 MG/ML SOLN COMPARISON:  Head CT from yesterday FINDINGS: CT HEAD FINDINGS Brain: Chronic small vessel ischemia with confluent biparietal white matter low-density and remote left occipital cortex infarct. Generalized brain atrophy with ventriculomegaly. Vascular: See below Skull: No acute finding Sinuses: Large retention cyst in the right maxillary sinus. Orbits: Negative Review of the MIP images confirms the above findings CTA NECK FINDINGS Aortic arch: Partial coverage is negative for acute finding or dilatation. Three vessel branching. Right carotid system: Atheromatous calcification at the bifurcation. No stenosis or ulceration. ICA tortuosity. Left carotid system: Mild atheromatous plaque for age. ICA tortuosity. No stenosis or ulceration Vertebral arteries: No proximal subclavian stenosis. Atheromatous plaque at the right vertebral origin. The vertebral arteries are smoothly contoured and widely patent when allowing for tortuosity accentuated by vertebral spurring. Skeleton: Advanced cervical spine degeneration with reversal of cervical lordosis. Other neck: No acute finding Upper chest: No acute finding Review of the MIP images confirms the above findings CTA HEAD FINDINGS Anterior circulation: Atheromatous calcification affecting the carotid siphons. Mild atheromatous irregularity of branch vessels. No branch occlusion, beading, or flow limiting stenosis. Negative for aneurysm. Posterior circulation: Vertebral and basilar tortuosity without stenosis. Fetal type right PCA. No branch occlusion, beading, or aneurysm. Venous sinuses: Unremarkable for the contrast phase Anatomic variants: As above CT Brain Perfusion Findings: Nondiagnostic due to motion artifact. No superimposed gross perfusion deficits. IMPRESSION: 1. No emergent finding. 2. Atherosclerosis without significant stenosis of major  arteries in the head and neck. 3. Nondiagnostic CT perfusion due to motion. No evidence of superimposed large territory deficit. Electronically Signed   By: JJorje GuildM.D.   On: 01/25/2022 04:44   DG Chest Portable 1 View  Result Date: 01/24/2022 CLINICAL DATA:  Altered mental status.  Cough. EXAM: PORTABLE CHEST 1 VIEW COMPARISON:  10/22/2020 FINDINGS: 1310 hours. Low volume film. The cardio pericardial silhouette is enlarged. The lungs are clear without focal pneumonia, edema, pneumothorax or pleural effusion. Bones are diffusely demineralized. Telemetry leads overlie the chest. IMPRESSION: Low volume film without acute cardiopulmonary findings. Electronically Signed   By: EMisty StanleyM.D.   On: 01/24/2022 13:25   CT HEAD CODE STROKE WO CONTRAST  Result Date: 01/24/2022 CLINICAL DATA:  Code stroke. Provided history: Neuro deficit, acute, stroke suspected. EXAM: CT HEAD WITHOUT CONTRAST TECHNIQUE: Contiguous axial images were obtained from the base of the skull through the vertex without intravenous contrast. RADIATION DOSE REDUCTION: This exam was performed according to the departmental dose-optimization program which includes automated exposure control, adjustment of the mA and/or kV according to patient size and/or use of iterative reconstruction technique. COMPARISON:  Prior head CT examinations 10/22/2020 and earlier. FINDINGS: Significantly motion degraded examination, limiting evaluation. Additionally, portions of the anteroinferior frontal lobes are excluded from the field of view (left greater than right). Brain: Moderate cerebral atrophy. Comparatively mild cerebellar atrophy. Moderate patchy and ill-defined hypoattenuation within the cerebral white matter, nonspecific  but compatible with chronic small vessel disease. Known chronic cortical/subcortical left occipital lobe infarct (PCA vascular territory). Within the limitations of significant motion degradation, no definite intracranial  hemorrhage or acute demarcated cortical infarct is identified. No appreciable intracranial mass or extra-axial fluid collection. No midline shift. Vascular: No hyperdense vessel. Atherosclerotic calcifications. Skull: No fracture or aggressive osseous lesion. Sinuses/Orbits: The orbits and the majority of the paranasal sinuses are excluded from the field of view. No appreciable significant paranasal sinus disease. ASPECTS ([place] Stroke Program Early CT Score) - Ganglionic level infarction (caudate, lentiform nuclei, internal capsule, insula, M1-M3 cortex): 7 - Supraganglionic infarction (M4-M6 cortex): 3 Total score (0-10 with 10 being normal): 10 Impressions #1 and #2 communicated to Dr. Leonie Man at 12:49 pmon 01/24/2022 by text page via the [facility] messaging system. IMPRESSION: 1. Examination significantly limited by motion degradation. Additionally, portions of the anteroinferior frontal lobes are excluded from the field of view (left greater than right). 2. Within this limitation, no definite acute intracranial abnormality is identified. 3. Known chronic left occipital lobe PCA territory infarct. 4. Moderate chronic small ischemic changes within the cerebral white matter. 5. Moderate cerebral atrophy. 6. Comparatively mild cerebellar atrophy. Electronically Signed   By: Kellie Simmering D.O.   On: 01/24/2022 12:52     PHYSICAL EXAM  Temp:  [97.9 F (36.6 C)-99.1 F (37.3 C)] 98 F (36.7 C) (07/10 1302) Pulse Rate:  [62-105] 62 (07/10 1302) Resp:  [14-22] 21 (07/10 1302) BP: (135-181)/(83-121) 135/95 (07/10 1302) SpO2:  [95 %-99 %] 99 % (07/10 1302)  General - Well nourished, well developed, not in acute distress  Ophthalmologic - fundi not visualized due to noncooperation.  Cardiovascular - irregularly irregular heart rate and rhythm.  Neuro - awake alert, eyes open, orientated to age, place, time. No aphasia, mild to dysarthria, following all simple commands. Able to name and repeat in dysarthric  voice. No gaze palsy, tracking bilaterally, visual field full, no hemianopia seen. Mild left nasolabial fold flattening. Tongue midline. Bilateral UEs 3/5, drift bilaterally. Bilaterally LE3-/5, toe movement 3/5. Sensation symmetrical bilaterally subjectively, b/l FTN difficulty to complete but no obvious ataxia, gait not tested.    ASSESSMENT/PLAN [name] is a 76 y.o. male with history of hypertension, hyperlipidemia, diabetes, A-fib on Eliquis, left occipital stroke 2 years ago admitted for right facial droop and slurred speech. No tPA given due to outside window.    Encephalopathy, improved Waxing and waning mental status overnight Likely multifactorial - respiratory distress, COVID with coughing, UTI, ? Stroke, deconditioning, ? transient fever, and hypertensive  On rocephin  Ammonia level negative EEG moderate diffuse slowing, no seizure Continue monitoring Treat underlying condition  COVID infection Cough Frequent coughing, not able to have MRI done COVID PCR positive Intermittent slight hypoxia Management per primary team  ? Stroke - MRI pending Initially reported right facial droop and slurry, now seems to have mild left facial droop CT head old left PCA infarct CTA head and neck unremarkable MRI not able to tolerate due to kyphosis  Repeat CT pending 2D Echo  70-75% LDL 94 HgbA1c 6.5 UDS negative Eliquis  for VTE prophylaxis Eliquis (apixaban) daily prior to admission, now on Eliquis (apixaban) daily.  Ongoing aggressive stroke risk factor management Therapy recommendations:  SNF Disposition:  pending  Chronic A-fib Rate controlled On home Eliquis On home metoprolol  Diabetes HgbA1c 6.5, goal < 7.0 Controlled CBG monitoring SSI DM education and close PCP follow up  Hypertension Unstable, on high and On Norvasc  10, Lasix, metoprolol Long term BP goal normotensive  Hyperlipidemia Home meds: Pravastatin 40 LDL 94, goal < 70 Now on  Crestor 20 Continue statin at discharge  Other Stroke Risk Factors Advanced age Hx stroke/TIA - CT old left PCA infarct - no right HH on my exam  Other Active Problems CKD 3A, creatinine 1.79->1.75 UTI, UA WBC 6-10, on Rocephin. Venous insufficiency, bilateral lower extremity swelling, on [facility] day # 1   Rosalin Hawking, MD PhD Stroke Neurology 01/27/2022 1:18 PM    To contact Stroke Continuity provider, please refer to [email or web address] After hours, contact General Neurology

## 2022-01-27 NOTE — Progress Notes (Signed)
PROGRESS NOTE    Saqib Hamman  RKY:[id number] DOB: March 30, 1946 DOA: 01/24/2022 PCP: Biagio Borg, MD   Brief Narrative:  [name] is a 76 year old African-American male with prior history of diabetes, hypertension, hyperlipidemia, COPD, paroxysmal A-fib on anticoagulation with Eliquis and history of left occipital infarct 2 years ago.  Per his daughters report he wasn't himself 01/23/22, had a fall and has had urinary incontinence. He was last seen at [facility] 01/23/22. This AM his daughter reports he was dysarthric and had right sided weakness. EMS activated. His symptoms seemed to improve in transit t [facility].    ED Course: Code Stroke called by EMS in transit. Pt seen at the bridge by Dr. Leonie Man for neurology who reported that symptoms seemed to have cleared. He was probable outside the window for thrombolytics and symtoms were not c/w LVO event. Emergent CT head was negative. Vitals T 101.2  BP 158/106  HR 81  RR 22. Lab notable for glucose 106, Cr 1.79 (chronic CKD), CBCD nl, TSH nl, Troponin 94. CXR NAD, EKG w/ a. Fib at rate of 85, LVH, old anterior injury, question of recent inferior injury.    Neuro recommeds TRH admit to complete workup with MRI brain, MRA brain and neck, ECHO. In ED due to fever and abnl U/A 1G Rocephin administered. Appropriate cultures done.   Assessment & Plan:   Principal Problem:   CVA (cerebral vascular accident) ([place]) Active Problems:   Fever   Cough   Elevated troponin   Diabetes mellitus with neuropathy (HCC)   Hyperlipidemia with target LDL less than 100   Hypertensive heart disease with chronic diastolic congestive heart failure ([place])   Permanent atrial fibrillation ([place]) - now off amiodarone; asymptomatic. CHA2DS2-VASc Score -5 (on Xarelto)   Venous insufficiency (chronic) (peripheral) with chronic lower extremity edema   Chronic kidney disease, stage 3b (HCC)   HTN (hypertension)   Hypomagnesemia   COVID-19  Dysarthria/right-sided  weakness: Patient with multiple risk factors including DM, HTN,HLD, obesity. Symptoms were transient and improved by the time he arrived to the ED.  He was out of the window for tPA.  Admitted to hospital service.  Patient has no complaints and no focal deficit.  CT head negative.  MRI pending.  Echo shows 41 to 75% ejection fraction with no wall motion normality and no PFO..  PT OT evaluated patient and recommends SNF.  SLP on board as well.  Continue Eliquis.  MRI was ordered and 4 attempts were made to complete the MRI but reportedly patient had different issues each time which led to unsuccessful MRI completion, the issues included coughing spells or inability to lay flat due to kyphosis.  This happened despite of giving him Ativan as well as cough medications.  Neurology was planning to repeat CT head, will defer to them.   Sepsis secondary to UTI, POA: Patient meets criteria for sepsis due to fever of 101.2 as well as tachypnea and UTI.  Patient has remained afebrile for more than 24 hours now.  Continue Rocephin .  Apparently urine was never sent for culture.  It is too late to send now.   Elevated troponin/demand ischemia: Troponin elevated but essentially flat, echo ruled out wall motion abnormality.  Cough/incidental COVID-19 positive: Patient has been having some cough.  He was tested positive for COVID-19.  Per daughter, patient has not had any exposure other than the family members and all the family numbers are tested negative.  Chest x-ray negative and he is  not hypoxic.  Discussed with the daughter and started on molnupiravir.   HTN (hypertension): Blood pressure slightly elevated, continue metoprolol, amlodipine as well as as needed hydralazine.   Venous insufficiency (chronic) (peripheral) with chronic lower extremity edema/lymphedema Patient with chronic venous stasis dermatitis w/o open lesions. Also mycotic nails. No need for additional therapy at this time.    Permanent atrial  fibrillation (HCC) - now off amiodarone; asymptomatic. CHA2DS2-VASc Score -5 (on Xarelto).  On metoprolol.   Hypertensive heart disease with chronic diastolic congestive heart failure ([place]) Patient appears well compensated.  Echo shows LVH consistent with uncontrolled hypertension.  Hyperlipidemia: Now on Crestor.  Diabetes mellitus with neuropathy (HCC) Last A1C 2 months ago 6.6% and currently 6.5. No antiglycemic meds listed.  Blood sugar controlled on SSI.   CKD stage IIIb: At baseline.  Hypomagnesemia: Resolved.  Discharge planning: PT OT recommended SNF however patient is declining SNF.  He is fully alert and oriented.  I had a lengthy discussion with him yesterday as well as today while talking to his daughter over the speaker phone who agreed with our recommendations.  She asked for more time to convince him.  I clearly told him that he will be unsafe if he were to go home as he remains at high fall risk.   DVT prophylaxis:   Eliquis   Code Status: Full Code  Family Communication: None present at bedside but discussed with his daughter over the speaker phone in his presence.  Plan of care discussed with patient in length and he/she verbalized understanding and agreed with it.  Status is: Inpatient Remains inpatient appropriate because: Needs MRI and placement to SNF.     Estimated body mass index is 18.99 kg/m as calculated from the following:   Height as of this encounter: '6\' 2"'$  (1.88 m).   Weight as of this encounter: 67.1 kg.  Pressure Injury 02/16/20 Sacrum Bilateral Stage 2 -  Partial thickness loss of dermis presenting as a shallow open injury with a red, pink wound bed without slough. (Active)  02/16/20 0400  Location: Sacrum  Location Orientation: Bilateral  Staging: Stage 2 -  Partial thickness loss of dermis presenting as a shallow open injury with a red, pink wound bed without slough.  Wound Description (Comments):   Present on Admission: Yes     Pressure  Injury 02/16/20 Right Stage 2 -  Partial thickness loss of dermis presenting as a shallow open injury with a red, pink wound bed without slough. (Active)  02/16/20 2200  Location:   Location Orientation: Right  Staging: Stage 2 -  Partial thickness loss of dermis presenting as a shallow open injury with a red, pink wound bed without slough.  Wound Description (Comments):   Present on Admission: Yes     Pressure Injury 02/16/20 Buttocks Right;Lateral Stage 2 -  Partial thickness loss of dermis presenting as a shallow open injury with a red, pink wound bed without slough. (Active)  02/16/20 2200  Location: Buttocks  Location Orientation: Right;Lateral  Staging: Stage 2 -  Partial thickness loss of dermis presenting as a shallow open injury with a red, pink wound bed without slough.  Wound Description (Comments):   Present on Admission: Yes   Nutritional Assessment: Body mass index is 18.99 kg/m.Marland Kitchen Seen by dietician.  I agree with the assessment and plan as outlined below: Nutrition Status:        . Skin Assessment: I have examined the patient's skin and I agree with the wound  assessment as performed by the wound care RN as outlined below: Pressure Injury 02/16/20 Sacrum Bilateral Stage 2 -  Partial thickness loss of dermis presenting as a shallow open injury with a red, pink wound bed without slough. (Active)  02/16/20 0400  Location: Sacrum  Location Orientation: Bilateral  Staging: Stage 2 -  Partial thickness loss of dermis presenting as a shallow open injury with a red, pink wound bed without slough.  Wound Description (Comments):   Present on Admission: Yes     Pressure Injury 02/16/20 Right Stage 2 -  Partial thickness loss of dermis presenting as a shallow open injury with a red, pink wound bed without slough. (Active)  02/16/20 2200  Location:   Location Orientation: Right  Staging: Stage 2 -  Partial thickness loss of dermis presenting as a shallow open injury with a  red, pink wound bed without slough.  Wound Description (Comments):   Present on Admission: Yes     Pressure Injury 02/16/20 Buttocks Right;Lateral Stage 2 -  Partial thickness loss of dermis presenting as a shallow open injury with a red, pink wound bed without slough. (Active)  02/16/20 2200  Location: Buttocks  Location Orientation: Right;Lateral  Staging: Stage 2 -  Partial thickness loss of dermis presenting as a shallow open injury with a red, pink wound bed without slough.  Wound Description (Comments):   Present on Admission: Yes    Consultants:  Neurology  Procedures:  None  Antimicrobials:  Anti-infectives (From admission, onward)    Start     Dose/Rate Route Frequency Ordered Stop   01/25/22 1700  cefTRIAXone (ROCEPHIN) 1 g in sodium chloride 0.9 % 100 mL IVPB        1 g 200 mL/hr over 30 Minutes Intravenous Every 24 hours 01/24/22 1829     01/25/22 1415  molnupiravir EUA (LAGEVRIO) capsule 800 mg        4 capsule Oral 2 times daily 01/25/22 1322 01/30/22 0959   01/25/22 1300  nirmatrelvir/ritonavir EUA (renal dosing) (PAXLOVID) 2 tablet  Status:  Discontinued        2 tablet Oral 2 times daily 01/25/22 1205 01/25/22 1321   01/24/22 1545  cefTRIAXone (ROCEPHIN) 1 g in sodium chloride 0.9 % 100 mL IVPB        1 g 200 mL/hr over 30 Minutes Intravenous  Once 01/24/22 1530 01/24/22 1717         Subjective: Patient seen and examined.  He has no new complaint.   Objective: Vitals:   01/27/22 0345 01/27/22 0432 01/27/22 0924 01/27/22 1302  BP: (!) 181/83 (!) 167/94 (!) 171/83 (!) 135/95  Pulse: 84  (!) 105 62  Resp: 20  20 (!) 21  Temp: 98.3 F (36.8 C)   98 F (36.7 C)  TempSrc: Oral   Oral  SpO2: 95%  96% 99%  Weight:      Height:        Intake/Output Summary (Last 24 hours) at 01/27/2022 1324 Last data filed at 01/27/2022 1312 Gross per 24 hour  Intake 1688.6 ml  Output 3000 ml  Net -1311.4 ml    Filed Weights   01/24/22 2102  Weight: 67.1 kg     Examination:  General exam: Appears calm and comfortable, obese Respiratory system: Clear to auscultation. Respiratory effort normal. Cardiovascular system: S1 & S2 heard, RRR. No JVD, murmurs, rubs, gallops or clicks. No pedal edema. Gastrointestinal system: Abdomen is nondistended, soft and nontender. No organomegaly or masses felt. Normal  bowel sounds heard. Central nervous system: Alert and oriented. No focal neurological deficits. Extremities: Symmetric 5 x 5 power. Skin: No rashes, lesions or ulcers.  Psychiatry: Judgement and insight appear poor.   Data Reviewed: I have personally reviewed following labs and imaging studies  CBC: Recent Labs  Lab 01/24/22 1300  WBC 6.8  NEUTROABS 4.8  HGB 13.3  HCT 42.5  MCV 86.4  PLT 248    Basic Metabolic Panel: Recent Labs  Lab 01/24/22 1440 01/25/22 0224 01/26/22 0057  NA 135  --  135  K 4.3  --  3.8  CL 102  --  105  CO2 20*  --  21*  GLUCOSE 106*  --  95  BUN 20  --  19  CREATININE 1.79*  --  1.75*  CALCIUM 8.9  --  7.9*  MG  --  1.5* 1.9    GFR: Estimated Creatinine Clearance: 34.6 mL/min (A) (by C-G formula based on SCr of 1.75 mg/dL (H)). Liver Function Tests: Recent Labs  Lab 01/24/22 1440  AST 28  ALT 12  ALKPHOS 132*  BILITOT 0.7  PROT 8.8*  ALBUMIN 2.6*    No results for input(s): "LIPASE", "AMYLASE" in the last 168 hours. Recent Labs  Lab 01/24/22 1440  AMMONIA 21    Coagulation Profile: Recent Labs  Lab 01/24/22 1433  INR 1.2    Cardiac Enzymes: No results for input(s): "CKTOTAL", "CKMB", "CKMBINDEX", "TROPONINI" in the last 168 hours. BNP (last 3 results) No results for input(s): "PROBNP" in the last 8760 hours. HbA1C: Recent Labs    01/26/22 0057  HGBA1C 6.5*    CBG: Recent Labs  Lab 01/26/22 1248 01/26/22 1634 01/26/22 2032 01/27/22 0619 01/27/22 1307  GLUCAP 141* 88 124* 106* 117*    Lipid Profile: Recent Labs    01/25/22 0224  CHOL 145  HDL 39*  LDLCALC  94  TRIG 61  CHOLHDL 3.7    Thyroid Function Tests: No results for input(s): "TSH", "T4TOTAL", "FREET4", "T3FREE", "THYROIDAB" in the last 72 hours.  Anemia Panel: No results for input(s): "VITAMINB12", "FOLATE", "FERRITIN", "TIBC", "IRON", "RETICCTPCT" in the last 72 hours. Sepsis Labs: Recent Labs  Lab 01/24/22 1440 01/24/22 1825  LATICACIDVEN 3.0* 2.4*     Recent Results (from the past 240 hour(s))  Resp Panel by RT-PCR (Flu A&B, Covid) Anterior Nasal Swab     Status: Abnormal   Collection Time: 01/24/22 12:26 PM   Specimen: Anterior Nasal Swab  Result Value Ref Range Status   SARS Coronavirus 2 by RT PCR POSITIVE (A) NEGATIVE Final    Comment: (NOTE) SARS-CoV-2 target nucleic acids are DETECTED.  The SARS-CoV-2 RNA is generally detectable in upper respiratory specimens during the acute phase of infection. Positive results are indicative of the presence of the identified virus, but do not rule out bacterial infection or co-infection with other pathogens not detected by the test. Clinical correlation with patient history and other diagnostic information is necessary to determine patient infection status. The expected result is Negative.  Fact Sheet for Patients: EntrepreneurPulse.com.au  Fact Sheet for Healthcare Providers: IncredibleEmployment.be  This test is not yet approved or cleared by the [place] FDA and  has been authorized for detection and/or diagnosis of SARS-CoV-2 by FDA under an Emergency Use Authorization (EUA).  This EUA will remain in effect (meaning this test can be used) for the duration of  the COVID-19 declaration under Section 564(b)(1) of the A ct, 21 U.S.C. section 360bbb-3(b)(1), unless the authorization is  terminated or revoked sooner.     Influenza A by PCR NEGATIVE NEGATIVE Final   Influenza B by PCR NEGATIVE NEGATIVE Final    Comment: (NOTE) The Xpert Xpress SARS-CoV-2/FLU/RSV plus assay is  intended as an aid in the diagnosis of influenza from Nasopharyngeal swab specimens and should not be used as a sole basis for treatment. Nasal washings and aspirates are unacceptable for Xpert Xpress SARS-CoV-2/FLU/RSV testing.  Fact Sheet for Patients: EntrepreneurPulse.com.au  Fact Sheet for Healthcare Providers: IncredibleEmployment.be  This test is not yet approved or cleared by the [place] FDA and has been authorized for detection and/or diagnosis of SARS-CoV-2 by FDA under an Emergency Use Authorization (EUA). This EUA will remain in effect (meaning this test can be used) for the duration of the COVID-19 declaration under Section 564(b)(1) of the Act, 21 U.S.C. section 360bbb-3(b)(1), unless the authorization is terminated or revoked.  Performed at [facility], [address] [place], [place] [address]   Blood culture (routine x 2)     Status: None (Preliminary result)   Collection Time: 01/24/22  2:33 PM   Specimen: BLOOD RIGHT HAND  Result Value Ref Range Status   Specimen Description BLOOD RIGHT HAND  Final   Special Requests   Final    BOTTLES DRAWN AEROBIC AND ANAEROBIC Blood Culture results may not be optimal due to an inadequate volume of blood received in culture bottles   Culture   Final    NO GROWTH 3 DAYS Performed at [facility], [place] [address] [place], [place] [address]    Report Status PENDING  Incomplete  Blood culture (routine x 2)     Status: None (Preliminary result)   Collection Time: 01/25/22  2:38 AM   Specimen: BLOOD RIGHT FOREARM  Result Value Ref Range Status   Specimen Description BLOOD RIGHT FOREARM  Final   Special Requests   Final    BOTTLES DRAWN AEROBIC ONLY Blood Culture adequate volume   Culture   Final    NO GROWTH 2 DAYS Performed at [facility], [place] [address] [place], [place] [address]    Report Status PENDING  Incomplete     Radiology Studies: EEG  adult  Result Date: 01/25/2022 Derek Jack, MD     01/25/2022  7:02 PM Routine EEG Report Vikas Langseth is a 75 y.o. male with a history of prior stroke and waxing/waning mental status who is undergoing an EEG to evaluate for seizures. Report: This EEG was acquired with electrodes placed according to the International 10-20 electrode system (including Fp1, Fp2, F3, F4, C3, C4, P3, P4, O1, O2, T3, T4, T5, T6, A1, A2, Fz, Cz, Pz). The following electrodes were missing or displaced: none. The occipital dominant rhythm was 5-6 Hz with intermittent more severe slowing diffusely. This activity is reactive to stimulation. Drowsiness was manifested by background fragmentation; deeper stages of sleep were not identified. There was no focal slowing. There were no interictal epileptiform discharges. There were no electrographic seizures identified. Photic stimulation and hyperventilation were not performed. Impression and clinical correlation: This EEG was obtained while awake and drowsy and is abnormal due to moderate diffuse slowing indicative of global cerebral dysfunction. Epileptiform abnormalities were not seen during this recording. Su Monks, MD [facility] [phone] If 7pm- 7am, please page neurology on call as listed in [place].    Scheduled Meds:  amLODipine  10 mg Oral Daily   apixaban  5 mg Oral BID   furosemide  40 mg Oral Daily   insulin aspart  0-20 Units Subcutaneous TID WC   metoprolol tartrate  12.5 mg Oral BID   molnupiravir EUA  4 capsule Oral BID   potassium chloride  10 mEq Oral Daily   rosuvastatin  20 mg Oral Daily   Continuous Infusions:  sodium chloride 75 mL/hr at 01/27/22 0431   cefTRIAXone (ROCEPHIN)  IV Stopped (01/26/22 1706)     LOS: 1 day   Darliss Cheney, MD [facility]  01/27/2022, 1:24 PM   *Please note that this is a verbal dictation therefore any spelling or grammatical errors are due to the "[place]" system  interpretation.  Please page via [place] and do not message via secure chat for urgent patient care matters. Secure chat can be used for non urgent patient care matters.  How to contact the [facility] Attending or Consulting provider [place] or covering provider during after hours [place], for this patient?  Check the care team in [facility] and look for a) attending/consulting [facility] provider listed and b) the [facility] team listed. Page or secure chat [phone]. Log into [email or web address] and use [place]'s universal password to access. If you do not have the password, please contact the hospital operator. Locate the [facility] provider you are looking for under [facility] and page to a number that you can be directly reached. If you still have difficulty reaching the provider, please page the [facility] (Director on Call) for the Hospitalists listed on amion for assistance.

## 2022-01-28 ENCOUNTER — Encounter (HOSPITAL_COMMUNITY): Payer: Self-pay | Admitting: Family Medicine

## 2022-01-28 DIAGNOSIS — E1140 Type 2 diabetes mellitus with diabetic neuropathy, unspecified: Secondary | ICD-10-CM | POA: Diagnosis not present

## 2022-01-28 DIAGNOSIS — U071 COVID-19: Secondary | ICD-10-CM | POA: Diagnosis not present

## 2022-01-28 DIAGNOSIS — N1832 Chronic kidney disease, stage 3b: Secondary | ICD-10-CM | POA: Diagnosis not present

## 2022-01-28 DIAGNOSIS — R059 Cough, unspecified: Secondary | ICD-10-CM | POA: Diagnosis not present

## 2022-01-28 DIAGNOSIS — I639 Cerebral infarction, unspecified: Secondary | ICD-10-CM | POA: Diagnosis not present

## 2022-01-28 LAB — GLUCOSE, CAPILLARY
Glucose-Capillary: 102 mg/dL — ABNORMAL HIGH (ref 70–99)
Glucose-Capillary: 96 mg/dL (ref 70–99)
Glucose-Capillary: 107 mg/dL — ABNORMAL HIGH (ref 70–99)
Glucose-Capillary: 100 mg/dL — ABNORMAL HIGH (ref 70–99)
Glucose-Capillary: 129 mg/dL — ABNORMAL HIGH (ref 70–99)

## 2022-01-28 LAB — POTASSIUM: Potassium: 3.7 mmol/L (ref 3.5–5.1)

## 2022-01-28 LAB — MAGNESIUM: Magnesium: 1.9 mg/dL (ref 1.7–2.4)

## 2022-01-28 MED ORDER — CARVEDILOL 3.125 MG PO TABS
3.1250 mg | ORAL_TABLET | Freq: Two times a day (BID) | ORAL | Status: DC
  Administered 2022-01-28 – 2022-01-30 (×5): 3.125 mg via ORAL
  Filled 2022-01-28 (×5): qty 1

## 2022-01-28 NOTE — TOC Progression Note (Addendum)
Transition of Care [facility]) - Progression Note    Patient Details  Name: Raymund Manrique MRN: [id number] Date of Birth: 14-Sep-1945  Transition of Care [facility]) CM/SW [place], [place] Phone Number: 01/28/2022, 2:09 PM  Clinical Narrative:     CSW called pt's daughter, [name], and provided SNF bed offers. [facility] and [facility] are the facilities that can accept with with covid+ test result from 7/7. [name] is not satisfied with these offers. CSW explained that other facilities would not be able to accept pt until day 11 after covid positive test. [name] explains she would not want [facility] as she had a family member who had a negative experienced and passed while there. [name] states she will have to discuss decision further with her sister and call CSW back.   Shortly after this phone call [facility] and [facility] made offers and can accept covid+ patients with minimal symptoms. CSW texted [name] this information.   The following facilities cannot accept covid+ patients until pt is more than 10 days past positive test which would be Monday 7/17(these are not offers): [facility] does accept covid+ patients but they do not have any bed availability.   1540: [facility] can offer bed as well 6 days post covid+. CSW notified daughter [name]. [name] called CSW and explained she is going to call [facility] before making decision. CSW provided phone number.   1650: [facility] Oz; no answer, left message notifying her that different [facility] staff will be following up with her tomorrow.     Expected Discharge Plan: Skilled Nursing Facility Barriers to Discharge: SNF Pending bed offer, Insurance Authorization, SNF Covid  Expected Discharge Plan and Services Expected Discharge Plan: [place] arrangements for the past 2 months:  Single Family Home                                       Social Determinants of Health (SDOH) Interventions    Readmission Risk Interventions     No data to display

## 2022-01-28 NOTE — [place] FL2 (Signed)
[place] MEDICAID FL2 LEVEL OF CARE SCREENING TOOL     IDENTIFICATION  Patient Name: [name] Birthdate: [birth year] Sex: male Admission Date (Current Location): 01/24/2022  [facility] and [place] Number:  Herbalist and Address:  [facility], [place] [address], [place], [place] [address]      Provider Number: [id number]  Attending Physician Name and Address:  Darliss Cheney, MD  Relative Name and Phone Number:  Aizen, Duval (Daughter)   [phone] (Home Phone)    Current Level of Care: Hospital Recommended Level of Care: [place] Prior Approval Number:    Date Approved/Denied:   PASRR Number: [id number]  Discharge Plan: SNF    Current Diagnoses: Patient Active Problem List   Diagnosis Date Noted   COVID-19 01/26/2022   Hypomagnesemia 01/25/2022   CVA (cerebral vascular accident) ([place]) 01/24/2022   Cough 01/24/2022   Elevated troponin 01/24/2022   Fever 01/24/2022   Bradycardia 11/21/2021   Vitamin D deficiency 11/21/2021   Other fatigue 11/18/2021   Bilateral leg edema 07/08/2021   Gait disorder 12/16/2020   HTN (hypertension) 12/16/2020   Aortic atherosclerosis ([place]) 12/12/2020   Diverticulitis 10/23/2020   Fall 10/22/2020   Sepsis ([place]) 10/22/2020   Acute lower UTI 10/22/2020   NSVT (nonsustained ventricular tachycardia) ([place])    Multiple myeloma ([place])    AKI (acute kidney injury) ([place]) 02/15/2020   General weakness    Diabetic leg ulcer ([place]) 10/06/2019   Chronic kidney disease, stage 3b ([place]) 10/06/2019   Diabetic ulcer of right lower leg ([place]) 02/23/2019   Abnormal LFTs 04/27/2018   Open wound of scrotum 03/01/2018   Acute on chronic diastolic CHF (congestive heart failure) ([place]) 02/25/2018   Postoperative anemia due to acute blood loss 02/25/2018   Occipital stroke ([place]), left 09/23/2017   Diabetes mellitus type II, non insulin dependent ([place])    Encounter for well adult exam with  abnormal findings 07/31/2017   Venous insufficiency (chronic) (peripheral) with chronic lower extremity edema 04/04/2016   Junctional tachycardia ([place]) 03/18/2015   Exertional dyspnea 07/05/2014   Obesity (BMI 30-39.9) 05/04/2014   Benign essential tremor 05/04/2014   Peripheral neuropathy 12/21/2013   Nonspecific abnormal finding in stool contents 09/06/2012   Heme positive stool 08/09/2012   Difficulty passing stool 08/09/2012   Sebaceous cyst 04/15/2012   Complex renal cyst 01/13/2011   Back pain 01/02/2011   Permanent atrial fibrillation ([place]) - now off amiodarone; asymptomatic. CHA2DS2-VASc Score -5 (on Xarelto) 01/02/2011   KNEE PAIN, LEFT 08/27/2009   COLONIC POLYPS, HX OF 08/08/2008   ERECTILE DYSFUNCTION 09/27/2007   ULNAR NEUROPATHY 09/27/2007   VENTRICULAR HYPERTROPHY, LEFT 09/27/2007   ALLERGIC RHINITIS 09/27/2007   Arthropathy 09/27/2007   GANGLION CYST, WRIST, LEFT 09/27/2007   HEMORRHOIDS, INTERNAL 06/10/2007   DIVERTICULOSIS, COLON 06/10/2007   DISC DISEASE, LUMBAR 06/03/2007   NEPHROLITHIASIS, HX OF 06/03/2007   Diabetes mellitus with neuropathy ([place]) 02/11/2007   Hyperlipidemia with target LDL less than 100 02/11/2007   ANXIETY 02/11/2007   Nondependent Alcohol Abuse, in Remission 02/11/2007   Hypertensive heart disease with chronic diastolic congestive heart failure ([place]) 02/11/2007   COPD (chronic obstructive pulmonary disease) ([place]) 02/11/2007   BENIGN PROSTATIC HYPERTROPHY 02/11/2007   LOW BACK PAIN 02/11/2007   Disturbance of skin sensation 02/11/2007   PROSTATE CANCER, HX OF 02/11/2007    Orientation RESPIRATION BLADDER Height & Weight     Self, Place  Normal External catheter Weight: 147 lb 14.9 oz (67.1 kg) Height:  '6\' 2"'  (188 cm)  BEHAVIORAL SYMPTOMS/MOOD NEUROLOGICAL BOWEL NUTRITION STATUS      Continent Diet (See d/c summary)  AMBULATORY STATUS COMMUNICATION OF NEEDS Skin   Extensive Assist Verbally Normal                        Personal Care Assistance Level of Assistance  Bathing, Feeding, Dressing Bathing Assistance: Maximum assistance Feeding assistance: Independent Dressing Assistance: Maximum assistance     Functional Limitations Info  Sight, Hearing, Speech Sight Info: Adequate Hearing Info: Adequate      SPECIAL CARE FACTORS FREQUENCY  PT (By licensed PT), OT (By licensed OT)     PT Frequency: 5x/week OT Frequency: 5x/week            Contractures Contractures Info: Not present    Additional Factors Info  Code Status, Allergies Code Status Info: Full code Allergies Info: Ciprofloxacin           Current Medications (01/28/2022):  This is the current hospital active medication list Current Facility-Administered Medications  Medication Dose Route Frequency Provider Last Rate Last Admin   0.9 %  sodium chloride infusion   Intravenous Continuous Norins, Heinz Knuckles, MD 75 mL/hr at 01/28/22 0716 New Bag at 01/28/22 0716   acetaminophen (TYLENOL) tablet 650 mg  650 mg Oral Q4H PRN Neena Rhymes, MD   650 mg at 01/28/22 0845   Or   acetaminophen (TYLENOL) 160 MG/5ML solution 650 mg  650 mg Per Tube Q4H PRN Norins, Heinz Knuckles, MD       Or   acetaminophen (TYLENOL) suppository 650 mg  650 mg Rectal Q4H PRN Norins, Heinz Knuckles, MD       albuterol (PROVENTIL) (2.5 MG/3ML) 0.083% nebulizer solution 2.5 mg  2.5 mg Inhalation Q6H PRN Norins, Heinz Knuckles, MD       amLODipine (NORVASC) tablet 10 mg  10 mg Oral Daily Darliss Cheney, MD   10 mg at 01/28/22 0845   apixaban (ELIQUIS) tablet 5 mg  5 mg Oral BID Norins, Heinz Knuckles, MD   5 mg at 01/28/22 0845   carvedilol (COREG) tablet 3.125 mg  3.125 mg Oral BID WC Pahwani, Einar Grad, MD   3.125 mg at 01/28/22 0845   cefTRIAXone (ROCEPHIN) 1 g in sodium chloride 0.9 % 100 mL IVPB  1 g Intravenous Q24H Pahwani, Ravi, MD 200 mL/hr at 01/27/22 1629 1 g at 01/27/22 1629   furosemide (LASIX) tablet 40 mg  40 mg Oral Daily Norins, Heinz Knuckles, MD   40 mg at 01/28/22 0845    guaiFENesin (ROBITUSSIN) 100 MG/5ML liquid 5 mL  5 mL Oral Q4H PRN Darliss Cheney, MD   5 mL at 01/28/22 1033   hydrALAZINE (APRESOLINE) injection 5 mg  5 mg Intravenous Q6H PRN Norins, Heinz Knuckles, MD   5 mg at 01/27/22 0346   HYDROcodone bit-homatropine (HYCODAN) 5-1.5 MG/5ML syrup 5 mL  5 mL Oral Q4H PRN Norins, Heinz Knuckles, MD   5 mL at 01/26/22 1644   insulin aspart (novoLOG) injection 0-20 Units  0-20 Units Subcutaneous TID WC Norins, Heinz Knuckles, MD   3 Units at 01/26/22 1331   molnupiravir EUA (LAGEVRIO) capsule 800 mg  4 capsule Oral BID Darliss Cheney, MD   800 mg at 01/28/22 0852   polyethylene glycol (MIRALAX / GLYCOLAX) packet 17 g  17 g Oral Daily PRN Norins, Heinz Knuckles, MD       potassium chloride (KLOR-CON) CR tablet 10 mEq  10 mEq Oral Daily Norins, Heinz Knuckles, MD   10 mEq at 01/28/22 0845   rosuvastatin (CRESTOR) tablet 20 mg  20 mg Oral Daily Rosalin Hawking, MD   20 mg at 01/28/22 0845   senna-docusate (Senokot-S) tablet 1 tablet  1 tablet Oral QHS PRN Norins, Heinz Knuckles, MD         Discharge Medications: Please see discharge summary for a list of discharge medications.  Relevant Imaging Results:  Relevant Lab Results:   Additional Information [id number]   Incidental covid positive on 01/24/2022. Pt is vaccinated x3  [place], LCSW

## 2022-01-28 NOTE — Progress Notes (Signed)
Physical Therapy Treatment Patient Details Name: Neema Fluegge MRN: [id number] DOB: [birth year] Today's Date: 01/28/2022   History of Present Illness 76 y/o male presented to ED on 01/24/22 as code stroke for L facial droop and slurred speech. CT head limited by motion but no definite acute abnormality. PMH: hx of L occipital PCA CVA, hx of prostate cancer, T2DM with neuropathy, COPD, Afib, CKD, HTN    PT Comments    After assisting pt off the bedpan unable to attempt getting EOB or OOB due to bed malfunctioning. Bed constantly sinking at the foot to the bed into reverse trendelenburg despite constantly leveling bed and even putting it in trendelenburg it would return to sinking into reverse trendelenburg.    Recommendations for follow up therapy are one component of a multi-disciplinary discharge planning process, led by the attending physician.  Recommendations may be updated based on patient status, additional functional criteria and insurance authorization.  Follow Up Recommendations  Skilled nursing-short term rehab (<3 hours/day) Can patient physically be transported by private vehicle: No   Assistance Recommended at Discharge Frequent or constant Supervision/Assistance  Patient can return home with the following Two people to help with walking and/or transfers;A lot of help with bathing/dressing/bathroom   Equipment Recommendations  Other (comment) (TBD)    Recommendations for Other Services       Precautions / Restrictions Precautions Precautions: Fall;Other (comment) Precaution Comments: Covid     Mobility  Bed Mobility Overal bed mobility: Needs Assistance Bed Mobility: Rolling Rolling: Min assist         General bed mobility comments: Rolled for removal of bedpan. Pt's bed malfunctioning and constantly sinking at the foot into a reverse trendelenburg position. Unable to attempt sitting EOB safely due to this.    Transfers                         Ambulation/Gait                   Stairs             Wheelchair Mobility    Modified Rankin (Stroke Patients Only)       Balance                                            Cognition Arousal/Alertness: Awake/alert Behavior During Therapy: WFL for tasks assessed/performed Overall Cognitive Status: Impaired/Different from baseline Area of Impairment: Attention, Memory, Following commands, Safety/judgement, Awareness, Problem solving                   Current Attention Level: Sustained Memory: Decreased short-term memory Following Commands: Follows one step commands with increased time Safety/Judgement: Decreased awareness of safety, Decreased awareness of deficits Awareness: Intellectual Problem Solving: Slow processing, Decreased initiation, Difficulty sequencing, Requires verbal cues, Requires tactile cues          Exercises      General Comments        Pertinent Vitals/Pain Pain Assessment Pain Assessment: No/denies pain    Home Living                          Prior Function            PT Goals (current goals can now be found in the care plan section) Progress towards PT goals: Not  progressing toward goals - comment (bed malfunctioning)    Frequency    Min 2X/week      PT Plan Current plan remains appropriate    Co-evaluation              AM-PAC PT "6 Clicks" Mobility   Outcome Measure  Help needed turning from your back to your side while in a flat bed without using bedrails?: A Little Help needed moving from lying on your back to sitting on the side of a flat bed without using bedrails?: Total Help needed moving to and from a bed to a chair (including a wheelchair)?: Total Help needed standing up from a chair using your arms (e.g., wheelchair or bedside chair)?: Total Help needed to walk in hospital room?: Total Help needed climbing 3-5 steps with a railing? : Total 6 Click Score:  8    End of Session   Activity Tolerance: Other (comment) (Limited by malfunctioning bed) Patient left: in bed;with call bell/phone within reach;with bed alarm set;Other (comment) (bed malfunctioning) Nurse Communication: Mobility status;Other (comment) PT Visit Diagnosis: Unsteadiness on feet (R26.81);Muscle weakness (generalized) (M62.81);Difficulty in walking, not elsewhere classified (R26.2)     Time: 1638-1700 PT Time Calculation (min) (ACUTE ONLY): 22 min  Charges:  $Therapeutic Activity: 8-22 mins                     [place] [facility] [place] 01/28/2022, 5:49 PM

## 2022-01-28 NOTE — Progress Notes (Signed)
STROKE TEAM PROGRESS NOTE   SUBJECTIVE (INTERVAL HISTORY) [name] is at the bedside. No event overnight, neuro stable. Repeat CT showed no acute abnormality. Pt now agreeable to SNF. Pending placement. Continue eliquis.    OBJECTIVE Temp:  [97.5 F (36.4 C)-98.2 F (36.8 C)] 97.5 F (36.4 C) (07/11 1309) Pulse Rate:  [64-97] 81 (07/11 1309) Cardiac Rhythm: Atrial fibrillation (07/11 0748) Resp:  [16-20] 20 (07/11 1309) BP: (113-177)/(69-103) 113/86 (07/11 1309) SpO2:  [91 %-100 %] 99 % (07/11 1309)  Recent Labs  Lab 01/27/22 1307 01/27/22 1622 01/27/22 2143 01/28/22 0652 01/28/22 1320  GLUCAP 117* 102* 109* 102* 96   Recent Labs  Lab 01/24/22 1440 01/25/22 0224 01/26/22 0057 01/27/22 1410 01/28/22 0633  NA 135  --  135 134*  --   K 4.3  --  3.8 3.5 3.7  CL 102  --  105 105  --   CO2 20*  --  21* 21*  --   GLUCOSE 106*  --  95 106*  --   BUN 20  --  19 14  --   CREATININE 1.79*  --  1.75* 1.51*  --   CALCIUM 8.9  --  7.9* 8.0*  --   MG  --  1.5* 1.9 1.7 1.9   Recent Labs  Lab 01/24/22 1440  AST 28  ALT 12  ALKPHOS 132*  BILITOT 0.7  PROT 8.8*  ALBUMIN 2.6*   Recent Labs  Lab 01/24/22 1300  WBC 6.8  NEUTROABS 4.8  HGB 13.3  HCT 42.5  MCV 86.4  PLT 181   No results for input(s): "CKTOTAL", "CKMB", "CKMBINDEX", "TROPONINI" in the last 168 hours. No results for input(s): "LABPROT", "INR" in the last 72 hours.  No results for input(s): "COLORURINE", "LABSPEC", "PHURINE", "GLUCOSEU", "HGBUR", "BILIRUBINUR", "KETONESUR", "PROTEINUR", "UROBILINOGEN", "NITRITE", "LEUKOCYTESUR" in the last 72 hours.  Invalid input(s): "APPERANCEUR"      Component Value Date/Time   CHOL 145 01/25/2022 0224   CHOL 142 08/23/2020 1504   TRIG 61 01/25/2022 0224   HDL 39 (L) 01/25/2022 0224   HDL 41 08/23/2020 1504   CHOLHDL 3.7 01/25/2022 0224   VLDL 12 01/25/2022 0224   LDLCALC 94 01/25/2022 0224   LDLCALC 88 08/23/2020 1504   Lab Results  Component Value Date   HGBA1C  6.5 (H) 01/26/2022      Component Value Date/Time   LABOPIA NONE DETECTED 01/24/2022 1547   COCAINSCRNUR NONE DETECTED 01/24/2022 1547   LABBENZ NONE DETECTED 01/24/2022 1547   AMPHETMU NONE DETECTED 01/24/2022 1547   THCU NONE DETECTED 01/24/2022 1547   LABBARB NONE DETECTED 01/24/2022 1547    Recent Labs  Lab 01/24/22 1440  ETH <10    I have personally reviewed the radiological images below and agree with the radiology interpretations.  CT HEAD WO CONTRAST (5MM)  Result Date: 01/27/2022 CLINICAL DATA:  Follow-up examination for possible stroke. EXAM: CT HEAD WITHOUT CONTRAST TECHNIQUE: Contiguous axial images were obtained from the base of the skull through the vertex without intravenous contrast. RADIATION DOSE REDUCTION: This exam was performed according to the departmental dose-optimization program which includes automated exposure control, adjustment of the mA and/or kV according to patient size and/or use of iterative reconstruction technique. COMPARISON:  Prior CT from 01/25/2022. FINDINGS: Brain: Age-related cerebral atrophy with moderate chronic small vessel ischemic disease. Chronic left PCA distribution infarct noted. No acute intracranial hemorrhage. No visible acute or evolving large vessel territory infarct. No mass lesion, midline shift or mass effect. No  hydrocephalus or extra-axial fluid collection. Vascular: No hyperdense vessel. Scattered vascular calcifications noted within the carotid siphons. Skull: Scalp soft tissues and calvarium within normal limits. Sinuses/Orbits: Globes and orbital soft tissues within normal limits. Moderate mucosal thickening in opacity present throughout the sphenoid ethmoidal and maxillary sinuses. Mastoid air cells remain clear. Other: None. IMPRESSION: 1. Stable head CT.  No acute intracranial abnormality. 2. Underlying atrophy with chronic small vessel ischemic disease with remote left PCA distribution infarct. Electronically Signed   By:  Jeannine Boga M.D.   On: 01/27/2022 19:30   EEG adult  Result Date: 01/25/2022 Derek Jack, MD     01/25/2022  7:02 PM Routine EEG Report [name] is a 76 y.o. male with a history of prior stroke and waxing/waning mental status who is undergoing an EEG to evaluate for seizures. Report: This EEG was acquired with electrodes placed according to the International 10-20 electrode system (including Fp1, Fp2, F3, F4, C3, C4, P3, P4, O1, O2, T3, T4, T5, T6, A1, A2, Fz, Cz, Pz). The following electrodes were missing or displaced: none. The occipital dominant rhythm was 5-6 Hz with intermittent more severe slowing diffusely. This activity is reactive to stimulation. Drowsiness was manifested by background fragmentation; deeper stages of sleep were not identified. There was no focal slowing. There were no interictal epileptiform discharges. There were no electrographic seizures identified. Photic stimulation and hyperventilation were not performed. Impression and clinical correlation: This EEG was obtained while awake and drowsy and is abnormal due to moderate diffuse slowing indicative of global cerebral dysfunction. Epileptiform abnormalities were not seen during this recording. Su Monks, MD [facility] [phone] If 7pm- 7am, please page neurology on call as listed in [place].   ECHOCARDIOGRAM COMPLETE  Result Date: 01/25/2022    ECHOCARDIOGRAM REPORT   Patient Name:   [name] Date of Exam: 01/25/2022 Medical Rec #:  [id number]           Height:       74.0 in Accession #:    [id number]          Weight:       147.9 lb Date of Birth:  08-17-1945           BSA:          1.912 m Patient Age:    76 years            BP:           174/78 mmHg Patient Gender: M                   HR:           68 bpm. Exam Location:  Inpatient Procedure: 2D Echo, Cardiac Doppler and Color Doppler Indications:    Stroke I63.9  History:        Patient has prior history of Echocardiogram  examinations, most                 recent 02/17/2020. COPD, Arrythmias:Atrial Fibrillation; Risk                 Factors:Hypertension, Dyslipidemia, Diabetes and ETOH.  Sonographer:    Bernadene Person RDCS Referring Phys: [place]  1. Left ventricular ejection fraction, by estimation, is 70 to 75%. The left ventricle has hyperdynamic function. The left ventricle has no regional wall motion abnormalities. There is mild left ventricular hypertrophy. Left ventricular diastolic parameters are indeterminate.  2. Right ventricular systolic function is normal.  The right ventricular size is mildly enlarged. There is normal pulmonary artery systolic pressure. The estimated right ventricular systolic pressure is 50.5 mmHg.  3. Left atrial size was severely dilated.  4. Right atrial size was moderately dilated.  5. The mitral valve is normal in structure. No evidence of mitral valve regurgitation. No evidence of mitral stenosis.  6. The aortic valve is normal in structure. Aortic valve regurgitation is not visualized. No aortic stenosis is present.  7. Aortic dilatation noted. There is mild dilatation of the aortic root, measuring 42 mm. There is mild dilatation of the ascending aorta, measuring 41 mm.  8. The inferior vena cava is dilated in size with >50% respiratory variability, suggesting right atrial pressure of 8 mmHg. Comparison(s): Prior images reviewed side by side. Conclusion(s)/Recommendation(s): No intracardiac source of embolism detected on this transthoracic study. Consider a transesophageal echocardiogram to exclude cardiac source of embolism if clinically indicated. FINDINGS  Left Ventricle: Left ventricular ejection fraction, by estimation, is 70 to 75%. The left ventricle has hyperdynamic function. The left ventricle has no regional wall motion abnormalities. The left ventricular internal cavity size was normal in size. There is mild left ventricular hypertrophy. Left ventricular  diastolic parameters are indeterminate. Right Ventricle: The right ventricular size is mildly enlarged. No increase in right ventricular wall thickness. Right ventricular systolic function is normal. There is normal pulmonary artery systolic pressure. The tricuspid regurgitant velocity is 1.54  m/s, and with an assumed right atrial pressure of 8 mmHg, the estimated right ventricular systolic pressure is 39.7 mmHg. Left Atrium: Left atrial size was severely dilated. Right Atrium: Right atrial size was moderately dilated. Pericardium: There is no evidence of pericardial effusion. Mitral Valve: The mitral valve is normal in structure. No evidence of mitral valve regurgitation. No evidence of mitral valve stenosis. Tricuspid Valve: The tricuspid valve is normal in structure. Tricuspid valve regurgitation is trivial. No evidence of tricuspid stenosis. Aortic Valve: The aortic valve is normal in structure. Aortic valve regurgitation is not visualized. No aortic stenosis is present. Pulmonic Valve: The pulmonic valve was normal in structure. Pulmonic valve regurgitation is trivial. No evidence of pulmonic stenosis. Aorta: Aortic dilatation noted. There is mild dilatation of the aortic root, measuring 42 mm. There is mild dilatation of the ascending aorta, measuring 41 mm. Venous: The inferior vena cava is dilated in size with greater than 50% respiratory variability, suggesting right atrial pressure of 8 mmHg. IAS/Shunts: No atrial level shunt detected by color flow Doppler.  LEFT VENTRICLE PLAX 2D LVIDd:         4.10 cm LVIDs:         2.40 cm LV PW:         1.30 cm LV IVS:        1.40 cm LVOT diam:     2.30 cm LV SV:         70 LV SV Index:   37 LVOT Area:     4.15 cm  LV Volumes (MOD) LV vol d, MOD A2C: 82.7 ml LV vol d, MOD A4C: 83.2 ml LV vol s, MOD A2C: 32.9 ml LV vol s, MOD A4C: 35.0 ml LV SV MOD A2C:     49.8 ml LV SV MOD A4C:     83.2 ml LV SV MOD BP:      48.3 ml RIGHT VENTRICLE RV S prime:     7.93 cm/s TAPSE  (M-mode): 1.9 cm LEFT ATRIUM  Index        RIGHT ATRIUM           Index LA diam:        4.50 cm  2.35 cm/m   RA Area:     28.60 cm LA Vol (A2C):   108.0 ml 56.49 ml/m  RA Volume:   91.00 ml  47.59 ml/m LA Vol (A4C):   121.0 ml 63.28 ml/m LA Biplane Vol: 120.0 ml 62.76 ml/m  AORTIC VALVE LVOT Vmax:   106.33 cm/s LVOT Vmean:  64.500 cm/s LVOT VTI:    0.168 m  AORTA Ao Root diam: 4.20 cm Ao Asc diam:  4.10 cm TRICUSPID VALVE TR Peak grad:   9.5 mmHg TR Vmax:        154.00 cm/s  SHUNTS Systemic VTI:  0.17 m Systemic Diam: 2.30 cm Candee Furbish MD Electronically signed by Candee Furbish MD Signature Date/Time: 01/25/2022/12:52:11 PM    Final    CT ANGIO HEAD NECK W WO CM W PERF  Result Date: 01/25/2022 CLINICAL DATA:  Neuro deficit with acute stroke suspected. EXAM: CT ANGIOGRAPHY HEAD AND NECK CT PERFUSION BRAIN TECHNIQUE: Multidetector CT imaging of the head and neck was performed using the standard protocol during bolus administration of intravenous contrast. Multiplanar CT image reconstructions and MIPs were obtained to evaluate the vascular anatomy. Carotid stenosis measurements (when applicable) are obtained utilizing NASCET criteria, using the distal internal carotid diameter as the denominator. Multiphase CT imaging of the brain was performed following IV bolus contrast injection. Subsequent parametric perfusion maps were calculated using RAPID software. RADIATION DOSE REDUCTION: This exam was performed according to the departmental dose-optimization program which includes automated exposure control, adjustment of the mA and/or kV according to patient size and/or use of iterative reconstruction technique. CONTRAST:  129m OMNIPAQUE IOHEXOL 350 MG/ML SOLN COMPARISON:  Head CT from yesterday FINDINGS: CT HEAD FINDINGS Brain: Chronic small vessel ischemia with confluent biparietal white matter low-density and remote left occipital cortex infarct. Generalized brain atrophy with ventriculomegaly.  Vascular: See below Skull: No acute finding Sinuses: Large retention cyst in the right maxillary sinus. Orbits: Negative Review of the MIP images confirms the above findings CTA NECK FINDINGS Aortic arch: Partial coverage is negative for acute finding or dilatation. Three vessel branching. Right carotid system: Atheromatous calcification at the bifurcation. No stenosis or ulceration. ICA tortuosity. Left carotid system: Mild atheromatous plaque for age. ICA tortuosity. No stenosis or ulceration Vertebral arteries: No proximal subclavian stenosis. Atheromatous plaque at the right vertebral origin. The vertebral arteries are smoothly contoured and widely patent when allowing for tortuosity accentuated by vertebral spurring. Skeleton: Advanced cervical spine degeneration with reversal of cervical lordosis. Other neck: No acute finding Upper chest: No acute finding Review of the MIP images confirms the above findings CTA HEAD FINDINGS Anterior circulation: Atheromatous calcification affecting the carotid siphons. Mild atheromatous irregularity of branch vessels. No branch occlusion, beading, or flow limiting stenosis. Negative for aneurysm. Posterior circulation: Vertebral and basilar tortuosity without stenosis. Fetal type right PCA. No branch occlusion, beading, or aneurysm. Venous sinuses: Unremarkable for the contrast phase Anatomic variants: As above CT Brain Perfusion Findings: Nondiagnostic due to motion artifact. No superimposed gross perfusion deficits. IMPRESSION: 1. No emergent finding. 2. Atherosclerosis without significant stenosis of major arteries in the head and neck. 3. Nondiagnostic CT perfusion due to motion. No evidence of superimposed large territory deficit. Electronically Signed   By: JJorje GuildM.D.   On: 01/25/2022 04:44   DG Chest Portable 1  View  Result Date: 01/24/2022 CLINICAL DATA:  Altered mental status.  Cough. EXAM: PORTABLE CHEST 1 VIEW COMPARISON:  10/22/2020 FINDINGS: 1310  hours. Low volume film. The cardio pericardial silhouette is enlarged. The lungs are clear without focal pneumonia, edema, pneumothorax or pleural effusion. Bones are diffusely demineralized. Telemetry leads overlie the chest. IMPRESSION: Low volume film without acute cardiopulmonary findings. Electronically Signed   By: Misty Stanley M.D.   On: 01/24/2022 13:25   CT HEAD CODE STROKE WO CONTRAST  Result Date: 01/24/2022 CLINICAL DATA:  Code stroke. Provided history: Neuro deficit, acute, stroke suspected. EXAM: CT HEAD WITHOUT CONTRAST TECHNIQUE: Contiguous axial images were obtained from the base of the skull through the vertex without intravenous contrast. RADIATION DOSE REDUCTION: This exam was performed according to the departmental dose-optimization program which includes automated exposure control, adjustment of the mA and/or kV according to patient size and/or use of iterative reconstruction technique. COMPARISON:  Prior head CT examinations 10/22/2020 and earlier. FINDINGS: Significantly motion degraded examination, limiting evaluation. Additionally, portions of the anteroinferior frontal lobes are excluded from the field of view (left greater than right). Brain: Moderate cerebral atrophy. Comparatively mild cerebellar atrophy. Moderate patchy and ill-defined hypoattenuation within the cerebral white matter, nonspecific but compatible with chronic small vessel disease. Known chronic cortical/subcortical left occipital lobe infarct (PCA vascular territory). Within the limitations of significant motion degradation, no definite intracranial hemorrhage or acute demarcated cortical infarct is identified. No appreciable intracranial mass or extra-axial fluid collection. No midline shift. Vascular: No hyperdense vessel. Atherosclerotic calcifications. Skull: No fracture or aggressive osseous lesion. Sinuses/Orbits: The orbits and the majority of the paranasal sinuses are excluded from the field of view. No  appreciable significant paranasal sinus disease. ASPECTS ([place] Stroke Program Early CT Score) - Ganglionic level infarction (caudate, lentiform nuclei, internal capsule, insula, M1-M3 cortex): 7 - Supraganglionic infarction (M4-M6 cortex): 3 Total score (0-10 with 10 being normal): 10 Impressions #1 and #2 communicated to Dr. Leonie Man at 12:49 pmon 01/24/2022 by text page via the [facility] messaging system. IMPRESSION: 1. Examination significantly limited by motion degradation. Additionally, portions of the anteroinferior frontal lobes are excluded from the field of view (left greater than right). 2. Within this limitation, no definite acute intracranial abnormality is identified. 3. Known chronic left occipital lobe PCA territory infarct. 4. Moderate chronic small ischemic changes within the cerebral white matter. 5. Moderate cerebral atrophy. 6. Comparatively mild cerebellar atrophy. Electronically Signed   By: Kellie Simmering D.O.   On: 01/24/2022 12:52     PHYSICAL EXAM  Temp:  [97.5 F (36.4 C)-98.2 F (36.8 C)] 97.5 F (36.4 C) (07/11 1309) Pulse Rate:  [64-97] 81 (07/11 1309) Resp:  [16-20] 20 (07/11 1309) BP: (113-177)/(69-103) 113/86 (07/11 1309) SpO2:  [91 %-100 %] 99 % (07/11 1309)  General - Well nourished, well developed, not in acute distress  Ophthalmologic - fundi not visualized due to noncooperation.  Cardiovascular - irregularly irregular heart rate and rhythm.  Neuro - awake alert, eyes open, orientated to age, place, time. No aphasia, mild to dysarthria, following all simple commands. Able to name and repeat in dysarthric voice. No gaze palsy, tracking bilaterally, visual field full, no hemianopia seen. Mild left nasolabial fold flattening. Tongue midline. Bilateral UEs 3/5, drift bilaterally. Bilaterally LE3-/5, toe movement 3/5. Sensation symmetrical bilaterally subjectively, b/l FTN difficulty to complete but no obvious ataxia, gait not tested.    ASSESSMENT/PLAN [name] is a 76 y.o. male with history of hypertension, hyperlipidemia, diabetes, A-fib on  Eliquis, left occipital stroke 2 years ago admitted for right facial droop and slurred speech. No tPA given due to outside window.    Encephalopathy, improved Waxing and waning mental status overnight Likely multifactorial - respiratory distress, COVID with coughing, UTI, deconditioning, ? transient fever, and hypertensive  On rocephin  Ammonia level negative EEG moderate diffuse slowing, no seizure Continue monitoring Treat underlying condition  COVID infection Cough Frequent coughing, not able to have MRI done COVID PCR positive Intermittent slight hypoxia Management per primary team  ? TIA vs. Stroke  Initially reported right facial droop and slurry, now seems to have mild left facial droop CT head old left PCA infarct CTA head and neck unremarkable MRI not able to tolerate due to kyphosis  Repeat CT no acute abnormality 2D Echo  70-75% LDL 94 HgbA1c 6.5 UDS negative Eliquis  for VTE prophylaxis Eliquis (apixaban) daily prior to admission, now on Eliquis (apixaban) daily. Continue on discharge Ongoing aggressive stroke risk factor management Therapy recommendations:  SNF Disposition:  pending  Chronic A-fib Rate controlled On home Eliquis On home metoprolol  Diabetes HgbA1c 6.5, goal < 7.0 Controlled CBG monitoring SSI DM education and close PCP follow up  Hypertension Unstable, on high and On Norvasc 10, Lasix, metoprolol Long term BP goal normotensive  Hyperlipidemia Home meds: Pravastatin 40 LDL 94, goal < 70 Now on Crestor 20 Continue statin at discharge  Other Stroke Risk Factors Advanced age Hx stroke/TIA - CT old left PCA infarct - no right HH on my exam  Other Active Problems CKD 3A, creatinine 1.79->1.75 UTI, UA WBC 6-10, on Rocephin. Venous insufficiency, bilateral lower extremity swelling, on [facility] day # 2  Neurology will sign  off. Please call with questions. Pt will follow up with stroke clinic NP at [facility] in about 4 weeks. Thanks for the consult.   Rosalin Hawking, MD PhD Stroke Neurology 01/28/2022 1:46 PM    To contact Stroke Continuity provider, please refer to [email or web address] After hours, contact General Neurology

## 2022-01-28 NOTE — Progress Notes (Signed)
Physical Therapy Treatment Patient Details Name: [name] MRN: [id number] DOB: Apr 30, 1946 Today's Date: 01/28/2022   History of Present Illness 76 y/o male presented to ED on 01/24/22 as code stroke for L facial droop and slurred speech. CT head limited by motion but no definite acute abnormality. PMH: hx of L occipital PCA CVA, hx of prostate cancer, T2DM with neuropathy, COPD, Afib, CKD, HTN    PT Comments    Planned to work on progressing mobility and balance. Pt needed to use the bed pan. Worked on bed mobility for placement of bed pan.    Recommendations for follow up therapy are one component of a multi-disciplinary discharge planning process, led by the attending physician.  Recommendations may be updated based on patient status, additional functional criteria and insurance authorization.  Follow Up Recommendations  Skilled nursing-short term rehab (<3 hours/day) Can patient physically be transported by private vehicle: No   Assistance Recommended at Discharge Frequent or constant Supervision/Assistance  Patient can return home with the following Two people to help with walking and/or transfers;A lot of help with bathing/dressing/bathroom   Equipment Recommendations  Other (comment) (TBD)    Recommendations for Other Services       Precautions / Restrictions Precautions Precautions: Fall;Other (comment) Precaution Comments: Covid     Mobility  Bed Mobility Overal bed mobility: Needs Assistance Bed Mobility: Rolling Rolling: Min assist         General bed mobility comments: Rolled for placement of bed pan.    Transfers                        Ambulation/Gait                   Stairs             Wheelchair Mobility    Modified Rankin (Stroke Patients Only)       Balance                                            Cognition Arousal/Alertness: Awake/alert Behavior During Therapy: WFL for tasks  assessed/performed Overall Cognitive Status: Impaired/Different from baseline Area of Impairment: Attention, Memory, Following commands, Safety/judgement, Awareness, Problem solving                   Current Attention Level: Sustained Memory: Decreased short-term memory Following Commands: Follows one step commands with increased time Safety/Judgement: Decreased awareness of safety, Decreased awareness of deficits Awareness: Intellectual Problem Solving: Slow processing, Decreased initiation, Difficulty sequencing, Requires verbal cues, Requires tactile cues          Exercises      General Comments        Pertinent Vitals/Pain Pain Assessment Pain Assessment: No/denies pain    Home Living                          Prior Function            PT Goals (current goals can now be found in the care plan section) Progress towards PT goals: Not progressing toward goals - comment (limited by need for bed pan)    Frequency    Min 2X/week      PT Plan Current plan remains appropriate    Co-evaluation  AM-PAC PT "6 Clicks" Mobility   Outcome Measure  Help needed turning from your back to your side while in a flat bed without using bedrails?: A Little Help needed moving from lying on your back to sitting on the side of a flat bed without using bedrails?: Total Help needed moving to and from a bed to a chair (including a wheelchair)?: Total Help needed standing up from a chair using your arms (e.g., wheelchair or bedside chair)?: Total Help needed to walk in hospital room?: Total Help needed climbing 3-5 steps with a railing? : Total 6 Click Score: 8    End of Session   Activity Tolerance: Other (comment) (Limited by need for bedpan) Patient left: in bed;with call bell/phone within reach;with bed alarm set;Other (comment) (on bedpan) Nurse Communication: Mobility status;Other (comment) (on bedpan) PT Visit Diagnosis: Unsteadiness on  feet (R26.81);Muscle weakness (generalized) (M62.81);Difficulty in walking, not elsewhere classified (R26.2)     Time: 1555-1610 PT Time Calculation (min) (ACUTE ONLY): 15 min  Charges:  $Therapeutic Activity: 8-22 mins                     [place] [facility] [place] 01/28/2022, 5:40 PM

## 2022-01-28 NOTE — Progress Notes (Signed)
PROGRESS NOTE    [name]  DYJ:[id number] DOB: August 09, 1945 DOA: 01/24/2022 PCP: Biagio Borg, MD   Brief Narrative:  [name] is a 76 year old African-American male with prior history of diabetes, hypertension, hyperlipidemia, COPD, paroxysmal A-fib on anticoagulation with Eliquis and history of left occipital infarct 2 years ago.  Per his daughters report he wasn't himself 01/23/22, had a fall and has had urinary incontinence. He was last seen at [facility] 01/23/22. This AM his daughter reports he was dysarthric and had right sided weakness. EMS activated. His symptoms seemed to improve in transit t [facility].    ED Course: Code Stroke called by EMS in transit. Pt seen at the bridge by Dr. Leonie Man for neurology who reported that symptoms seemed to have cleared. He was probable outside the window for thrombolytics and symtoms were not c/w LVO event. Emergent CT head was negative. Vitals T 101.2  BP 158/106  HR 81  RR 22. Lab notable for glucose 106, Cr 1.79 (chronic CKD), CBCD nl, TSH nl, Troponin 94. CXR NAD, EKG w/ a. Fib at rate of 85, LVH, old anterior injury, question of recent inferior injury.    Neuro recommeds TRH admit to complete workup with MRI brain, MRA brain and neck, ECHO. In ED due to fever and abnl U/A 1G Rocephin administered. Appropriate cultures done.   Assessment & Plan:   Principal Problem:   CVA (cerebral vascular accident) ([place]) Active Problems:   Fever   Cough   Elevated troponin   Diabetes mellitus with neuropathy (HCC)   Hyperlipidemia with target LDL less than 100   Hypertensive heart disease with chronic diastolic congestive heart failure ([place])   Permanent atrial fibrillation ([place]) - now off amiodarone; asymptomatic. CHA2DS2-VASc Score -5 (on Xarelto)   Venous insufficiency (chronic) (peripheral) with chronic lower extremity edema   Chronic kidney disease, stage 3b (HCC)   HTN (hypertension)   Hypomagnesemia   COVID-19  Dysarthria/right-sided  weakness: Patient with multiple risk factors including DM, HTN,HLD, obesity. Symptoms were transient and improved by the time he arrived to the ED.  He was out of the window for tPA.  Admitted to hospital service.  Patient has no complaints and no focal deficit.  CT head negative.  MRI pending.  Echo shows 25 to 75% ejection fraction with no wall motion normality and no PFO..  PT OT evaluated patient and recommends SNF.  SLP on board as well.  Continue Eliquis.  MRI was ordered and 4 attempts were made to complete the MRI but reportedly patient had different issues each time which led to unsuccessful MRI completion, the issues included coughing spells or inability to lay flat due to kyphosis.  This happened despite of giving him Ativan as well as cough medications.  Neurology repeated CT head which was unremarkable.  They cleared him for discharge.  Patient has no focal deficit.   Sepsis secondary to UTI, POA: Patient meets criteria for sepsis due to fever of 101.2 as well as tachypnea and UTI.  Patient has remained afebrile for more than 24 hours now.  Continue Rocephin for total of 5 days.  Apparently urine was never sent for culture.  It is too late to send now.   Elevated troponin/demand ischemia: Troponin elevated but essentially flat, echo ruled out wall motion abnormality.  Cough/incidental COVID-19 positive: Patient has been having some cough.  He was tested positive for COVID-19.  Per daughter, patient has not had any exposure other than the family members and all the  family numbers are tested negative.  Chest x-ray negative and he is not hypoxic.  Discussed with the daughter and started on molnupiravir.   HTN (hypertension): Blood pressure slightly elevated, continue metoprolol, amlodipine as well as as needed hydralazine.   Venous insufficiency (chronic) (peripheral) with chronic lower extremity edema/lymphedema Patient with chronic venous stasis dermatitis w/o open lesions. Also mycotic  nails. No need for additional therapy at this time.    Permanent atrial fibrillation (HCC) - now off amiodarone; asymptomatic. CHA2DS2-VASc Score -5 (on Xarelto).  On metoprolol.   Hypertensive heart disease with chronic diastolic congestive heart failure ([place]) Patient appears well compensated.  Echo shows LVH consistent with uncontrolled hypertension.  Hyperlipidemia: Now on Crestor.  Diabetes mellitus with neuropathy (HCC) Last A1C 2 months ago 6.6% and currently 6.5. No antiglycemic meds listed.  Blood sugar controlled on SSI.   CKD stage IIIb: At baseline.  Hypomagnesemia: Resolved.  Discharge planning: PT OT recommended SNF however initially patient was declining and was adamant about going home but finally after a lot of counseling and talk, he has agreed to go to SNF but according to [facility], unfortunately, no SNF will accept him until he is 10 days past his COVID test positive which was on 01/24/2022 so he will be eligible for discharge on 02/03/2022.   DVT prophylaxis:   Eliquis   Code Status: Full Code  Family Communication: None present at bedside.  Plan of care discussed with patient in length and he/she verbalized understanding and agreed with it.  Status is: Inpatient Remains inpatient appropriate because: Medically stable but needs discharge to SNF and per [facility], SNF will not accept him until 02/03/2022 due to COVID test positive on 01/24/2022.  Estimated body mass index is 18.99 kg/m as calculated from the following:   Height as of this encounter: '6\' 2"'$  (1.88 m).   Weight as of this encounter: 67.1 kg.  Pressure Injury 02/16/20 Sacrum Bilateral Stage 2 -  Partial thickness loss of dermis presenting as a shallow open injury with a red, pink wound bed without slough. (Active)  02/16/20 0400  Location: Sacrum  Location Orientation: Bilateral  Staging: Stage 2 -  Partial thickness loss of dermis presenting as a shallow open injury with a red, pink wound bed without slough.  Wound  Description (Comments):   Present on Admission: Yes     Pressure Injury 02/16/20 Right Stage 2 -  Partial thickness loss of dermis presenting as a shallow open injury with a red, pink wound bed without slough. (Active)  02/16/20 2200  Location:   Location Orientation: Right  Staging: Stage 2 -  Partial thickness loss of dermis presenting as a shallow open injury with a red, pink wound bed without slough.  Wound Description (Comments):   Present on Admission: Yes     Pressure Injury 02/16/20 Buttocks Right;Lateral Stage 2 -  Partial thickness loss of dermis presenting as a shallow open injury with a red, pink wound bed without slough. (Active)  02/16/20 2200  Location: Buttocks  Location Orientation: Right;Lateral  Staging: Stage 2 -  Partial thickness loss of dermis presenting as a shallow open injury with a red, pink wound bed without slough.  Wound Description (Comments):   Present on Admission: Yes   Nutritional Assessment: Body mass index is 18.99 kg/m.Marland Kitchen Seen by dietician.  I agree with the assessment and plan as outlined below: Nutrition Status:        . Skin Assessment: I have examined the patient's skin and I  agree with the wound assessment as performed by the wound care RN as outlined below: Pressure Injury 02/16/20 Sacrum Bilateral Stage 2 -  Partial thickness loss of dermis presenting as a shallow open injury with a red, pink wound bed without slough. (Active)  02/16/20 0400  Location: Sacrum  Location Orientation: Bilateral  Staging: Stage 2 -  Partial thickness loss of dermis presenting as a shallow open injury with a red, pink wound bed without slough.  Wound Description (Comments):   Present on Admission: Yes     Pressure Injury 02/16/20 Right Stage 2 -  Partial thickness loss of dermis presenting as a shallow open injury with a red, pink wound bed without slough. (Active)  02/16/20 2200  Location:   Location Orientation: Right  Staging: Stage 2 -  Partial  thickness loss of dermis presenting as a shallow open injury with a red, pink wound bed without slough.  Wound Description (Comments):   Present on Admission: Yes     Pressure Injury 02/16/20 Buttocks Right;Lateral Stage 2 -  Partial thickness loss of dermis presenting as a shallow open injury with a red, pink wound bed without slough. (Active)  02/16/20 2200  Location: Buttocks  Location Orientation: Right;Lateral  Staging: Stage 2 -  Partial thickness loss of dermis presenting as a shallow open injury with a red, pink wound bed without slough.  Wound Description (Comments):   Present on Admission: Yes    Consultants:  Neurology  Procedures:  None  Antimicrobials:  Anti-infectives (From admission, onward)    Start     Dose/Rate Route Frequency Ordered Stop   01/25/22 1700  cefTRIAXone (ROCEPHIN) 1 g in sodium chloride 0.9 % 100 mL IVPB        1 g 200 mL/hr over 30 Minutes Intravenous Every 24 hours 01/24/22 1829 01/28/22 2359   01/25/22 1415  molnupiravir EUA (LAGEVRIO) capsule 800 mg        4 capsule Oral 2 times daily 01/25/22 1322 01/30/22 0959   01/25/22 1300  nirmatrelvir/ritonavir EUA (renal dosing) (PAXLOVID) 2 tablet  Status:  Discontinued        2 tablet Oral 2 times daily 01/25/22 1205 01/25/22 1321   01/24/22 1545  cefTRIAXone (ROCEPHIN) 1 g in sodium chloride 0.9 % 100 mL IVPB        1 g 200 mL/hr over 30 Minutes Intravenous  Once 01/24/22 1530 01/24/22 1717         Subjective: Seen and examined.  He is doing well.  He has no complaints.  He is finally agreeable to go to SNF.  Objective: Vitals:   01/27/22 2356 01/28/22 0334 01/28/22 0736 01/28/22 1309  BP: (!) 159/75 (!) 143/92 (!) 170/69 113/86  Pulse: 69 64 78 81  Resp: '16 19 20 20  '$ Temp: 97.7 F (36.5 C) 97.8 F (36.6 C) 97.6 F (36.4 C) (!) 97.5 F (36.4 C)  TempSrc: Axillary Oral Oral Oral  SpO2: 91% 100% 99% 99%  Weight:      Height:        Intake/Output Summary (Last 24 hours) at  01/28/2022 1338 Last data filed at 01/28/2022 0555 Gross per 24 hour  Intake --  Output 1550 ml  Net -1550 ml    Filed Weights   01/24/22 2102  Weight: 67.1 kg    Examination:  General exam: Appears calm and comfortable, obese Respiratory system: Clear to auscultation. Respiratory effort normal. Cardiovascular system: S1 & S2 heard, RRR. No JVD, murmurs, rubs, gallops or  clicks. No pedal edema. Gastrointestinal system: Abdomen is nondistended, soft and nontender. No organomegaly or masses felt. Normal bowel sounds heard. Central nervous system: Alert and oriented. No focal neurological deficits. Extremities: Symmetric 5 x 5 power. Skin: No rashes, lesions or ulcers.  Psychiatry: Judgement and insight appear normal. Mood & affect appropriate.   Data Reviewed: I have personally reviewed following labs and imaging studies  CBC: Recent Labs  Lab 01/24/22 1300  WBC 6.8  NEUTROABS 4.8  HGB 13.3  HCT 42.5  MCV 86.4  PLT 431    Basic Metabolic Panel: Recent Labs  Lab 01/24/22 1440 01/25/22 0224 01/26/22 0057 01/27/22 1410 01/28/22 0633  NA 135  --  135 134*  --   K 4.3  --  3.8 3.5 3.7  CL 102  --  105 105  --   CO2 20*  --  21* 21*  --   GLUCOSE 106*  --  95 106*  --   BUN 20  --  19 14  --   CREATININE 1.79*  --  1.75* 1.51*  --   CALCIUM 8.9  --  7.9* 8.0*  --   MG  --  1.5* 1.9 1.7 1.9    GFR: Estimated Creatinine Clearance: 40.1 mL/min (A) (by C-G formula based on SCr of 1.51 mg/dL (H)). Liver Function Tests: Recent Labs  Lab 01/24/22 1440  AST 28  ALT 12  ALKPHOS 132*  BILITOT 0.7  PROT 8.8*  ALBUMIN 2.6*    No results for input(s): "LIPASE", "AMYLASE" in the last 168 hours. Recent Labs  Lab 01/24/22 1440  AMMONIA 21    Coagulation Profile: Recent Labs  Lab 01/24/22 1433  INR 1.2    Cardiac Enzymes: No results for input(s): "CKTOTAL", "CKMB", "CKMBINDEX", "TROPONINI" in the last 168 hours. BNP (last 3 results) No results for  input(s): "PROBNP" in the last 8760 hours. HbA1C: Recent Labs    01/26/22 0057  HGBA1C 6.5*    CBG: Recent Labs  Lab 01/27/22 1307 01/27/22 1622 01/27/22 2143 01/28/22 0652 01/28/22 1320  GLUCAP 117* 102* 109* 102* 96    Lipid Profile: No results for input(s): "CHOL", "HDL", "LDLCALC", "TRIG", "CHOLHDL", "LDLDIRECT" in the last 72 hours.  Thyroid Function Tests: No results for input(s): "TSH", "T4TOTAL", "FREET4", "T3FREE", "THYROIDAB" in the last 72 hours.  Anemia Panel: No results for input(s): "VITAMINB12", "FOLATE", "FERRITIN", "TIBC", "IRON", "RETICCTPCT" in the last 72 hours. Sepsis Labs: Recent Labs  Lab 01/24/22 1440 01/24/22 1825  LATICACIDVEN 3.0* 2.4*     Recent Results (from the past 240 hour(s))  Resp Panel by RT-PCR (Flu A&B, Covid) Anterior Nasal Swab     Status: Abnormal   Collection Time: 01/24/22 12:26 PM   Specimen: Anterior Nasal Swab  Result Value Ref Range Status   SARS Coronavirus 2 by RT PCR POSITIVE (A) NEGATIVE Final    Comment: (NOTE) SARS-CoV-2 target nucleic acids are DETECTED.  The SARS-CoV-2 RNA is generally detectable in upper respiratory specimens during the acute phase of infection. Positive results are indicative of the presence of the identified virus, but do not rule out bacterial infection or co-infection with other pathogens not detected by the test. Clinical correlation with patient history and other diagnostic information is necessary to determine patient infection status. The expected result is Negative.  Fact Sheet for Patients: EntrepreneurPulse.com.au  Fact Sheet for Healthcare Providers: IncredibleEmployment.be  This test is not yet approved or cleared by the [place] and  has been authorized for  detection and/or diagnosis of SARS-CoV-2 by FDA under an Emergency Use Authorization (EUA).  This EUA will remain in effect (meaning this test can be used) for the duration  of  the COVID-19 declaration under Section 564(b)(1) of the A ct, 21 U.S.C. section 360bbb-3(b)(1), unless the authorization is terminated or revoked sooner.     Influenza A by PCR NEGATIVE NEGATIVE Final   Influenza B by PCR NEGATIVE NEGATIVE Final    Comment: (NOTE) The Xpert Xpress SARS-CoV-2/FLU/RSV plus assay is intended as an aid in the diagnosis of influenza from Nasopharyngeal swab specimens and should not be used as a sole basis for treatment. Nasal washings and aspirates are unacceptable for Xpert Xpress SARS-CoV-2/FLU/RSV testing.  Fact Sheet for Patients: EntrepreneurPulse.com.au  Fact Sheet for Healthcare Providers: IncredibleEmployment.be  This test is not yet approved or cleared by the [place] FDA and has been authorized for detection and/or diagnosis of SARS-CoV-2 by FDA under an Emergency Use Authorization (EUA). This EUA will remain in effect (meaning this test can be used) for the duration of the COVID-19 declaration under Section 564(b)(1) of the Act, 21 U.S.C. section 360bbb-3(b)(1), unless the authorization is terminated or revoked.  Performed at [facility], [place] [address], [place], [place] [address]   Blood culture (routine x 2)     Status: None (Preliminary result)   Collection Time: 01/24/22  2:33 PM   Specimen: BLOOD RIGHT HAND  Result Value Ref Range Status   Specimen Description BLOOD RIGHT HAND  Final   Special Requests   Final    BOTTLES DRAWN AEROBIC AND ANAEROBIC Blood Culture results may not be optimal due to an inadequate volume of blood received in culture bottles   Culture   Final    NO GROWTH 4 DAYS Performed at [facility], [place] [address], [place], [place] [address]    Report Status PENDING  Incomplete  Blood culture (routine x 2)     Status: None (Preliminary result)   Collection Time: 01/25/22  2:38 AM   Specimen: BLOOD RIGHT FOREARM  Result Value Ref Range Status   Specimen  Description BLOOD RIGHT FOREARM  Final   Special Requests   Final    BOTTLES DRAWN AEROBIC ONLY Blood Culture adequate volume   Culture   Final    NO GROWTH 3 DAYS Performed at [facility], [place] [address], [place], [place] [address]    Report Status PENDING  Incomplete     Radiology Studies: CT HEAD WO CONTRAST (5MM)  Result Date: 01/27/2022 CLINICAL DATA:  Follow-up examination for possible stroke. EXAM: CT HEAD WITHOUT CONTRAST TECHNIQUE: Contiguous axial images were obtained from the base of the skull through the vertex without intravenous contrast. RADIATION DOSE REDUCTION: This exam was performed according to the departmental dose-optimization program which includes automated exposure control, adjustment of the mA and/or kV according to patient size and/or use of iterative reconstruction technique. COMPARISON:  Prior CT from 01/25/2022. FINDINGS: Brain: Age-related cerebral atrophy with moderate chronic small vessel ischemic disease. Chronic left PCA distribution infarct noted. No acute intracranial hemorrhage. No visible acute or evolving large vessel territory infarct. No mass lesion, midline shift or mass effect. No hydrocephalus or extra-axial fluid collection. Vascular: No hyperdense vessel. Scattered vascular calcifications noted within the carotid siphons. Skull: Scalp soft tissues and calvarium within normal limits. Sinuses/Orbits: Globes and orbital soft tissues within normal limits. Moderate mucosal thickening in opacity present throughout the sphenoid ethmoidal and maxillary sinuses. Mastoid air cells  remain clear. Other: None. IMPRESSION: 1. Stable head CT.  No acute intracranial abnormality. 2. Underlying atrophy with chronic small vessel ischemic disease with remote left PCA distribution infarct. Electronically Signed   By: Jeannine Boga M.D.   On: 01/27/2022 19:30    Scheduled Meds:  amLODipine  10 mg Oral Daily   apixaban  5 mg Oral BID   carvedilol  3.125 mg Oral  BID WC   furosemide  40 mg Oral Daily   insulin aspart  0-20 Units Subcutaneous TID WC   molnupiravir EUA  4 capsule Oral BID   potassium chloride  10 mEq Oral Daily   rosuvastatin  20 mg Oral Daily   Continuous Infusions:  sodium chloride 75 mL/hr at 01/28/22 0716   cefTRIAXone (ROCEPHIN)  IV 1 g (01/27/22 1629)     LOS: 2 days   Darliss Cheney, MD [facility]  01/28/2022, 1:38 PM   *Please note that this is a verbal dictation therefore any spelling or grammatical errors are due to the "[place]" system interpretation.  Please page via [place] and do not message via secure chat for urgent patient care matters. Secure chat can be used for non urgent patient care matters.  How to contact the [facility] Attending or Consulting provider [place] or covering provider during after hours [place], for this patient?  Check the care team in [facility] and look for a) attending/consulting [facility] provider listed and b) the [facility] team listed. Page or secure chat [phone]. Log into [email or web address] and use [place]'s universal password to access. If you do not have the password, please contact the hospital operator. Locate the [facility] provider you are looking for under [facility] and page to a number that you can be directly reached. If you still have difficulty reaching the provider, please page the [facility] (Director on Call) for the Hospitalists listed on amion for assistance.

## 2022-01-28 NOTE — TOC Initial Note (Signed)
Transition of Care [facility]) - Initial/Assessment Note    Patient Details  Name: [name] MRN: [id number] Date of Birth: March 17, 1946  Transition of Care [facility]) CM/SW Contact:    Bethann Berkshire, [place] Phone Number: 01/28/2022, 11:40 AM  Clinical Narrative:                  CSW informed by MD pt and daughter are agreeable to SNF. CSW called pt's daughter, [name], who lives with pt. She is agreeable to SNF workup though explains that she was told pt would need to wait 10 days prior to DC to a SNF due to covid positive test on 01/24/22. CSW explained that some SNF's require 10 days and some do not; CSW explained that pt cannot be held at the hospital if medically ready. Daughter states that pt has been to [facility] in the past and would be interested in this facility. She also expresses interest in [facility]. CSW completed fl2 and faxed bed requests in hub.   Expected Discharge Plan: Skilled Nursing Facility Barriers to Discharge: SNF Pending bed offer, Insurance Authorization, SNF Covid   Patient Goals and CMS Choice        Expected Discharge Plan and Services Expected Discharge Plan: [place] arrangements for the past 2 months: Single Family Home                                      Prior Living Arrangements/Services Living arrangements for the past 2 months: Single Family Home Lives with:: Adult Children Patient language and need for interpreter reviewed:: Yes        Need for Family Participation in Patient Care: Yes (Comment) Care giver support system in place?: Yes (comment)   Criminal Activity/Legal Involvement Pertinent to Current Situation/Hospitalization: No - Comment as needed  Activities of Daily Living Home Assistive Devices/Equipment: Walker (specify type) ADL Screening (condition at time of admission) Patient's cognitive ability adequate to safely complete daily activities?: Yes Is the patient deaf or have difficulty  hearing?: No Does the patient have difficulty seeing, even when wearing glasses/contacts?: No Does the patient have difficulty concentrating, remembering, or making decisions?: Yes Patient able to express need for assistance with ADLs?: Yes Does the patient have difficulty dressing or bathing?: No Independently performs ADLs?: No Does the patient have difficulty walking or climbing stairs?: Yes Weakness of Legs: Both Weakness of Arms/Hands: Both  Permission Sought/Granted                  Emotional Assessment       Orientation: : Oriented to Self, Oriented to Place Alcohol / Substance Use: Not Applicable Psych Involvement: No (comment)  Admission diagnosis:  TIA (transient ischemic attack) [G45.9] CVA (cerebral vascular accident) ([place]) [I63.9] Fever, unspecified fever cause [R50.9] Urinary incontinence, unspecified type [R32] COVID-19 [U07.1] Patient Active Problem List   Diagnosis Date Noted   COVID-19 01/26/2022   Hypomagnesemia 01/25/2022   CVA (cerebral vascular accident) ([place]) 01/24/2022   Cough 01/24/2022   Elevated troponin 01/24/2022   Fever 01/24/2022   Bradycardia 11/21/2021   Vitamin D deficiency 11/21/2021   Other fatigue 11/18/2021   Bilateral leg edema 07/08/2021   Gait disorder 12/16/2020   HTN (hypertension) 12/16/2020   Aortic atherosclerosis ([place]) 12/12/2020   Diverticulitis 10/23/2020   Fall 10/22/2020   Sepsis ([place]) 10/22/2020   Acute lower UTI 10/22/2020  NSVT (nonsustained ventricular tachycardia) (HCC)    Multiple myeloma ([place])    AKI (acute kidney injury) ([place]) 02/15/2020   General weakness    Diabetic leg ulcer ([place]) 10/06/2019   Chronic kidney disease, stage 3b ([place]) 10/06/2019   Diabetic ulcer of right lower leg ([place]) 02/23/2019   Abnormal LFTs 04/27/2018   Open wound of scrotum 03/01/2018   Acute on chronic diastolic CHF (congestive heart failure) ([place]) 02/25/2018   Postoperative anemia due to acute blood loss 02/25/2018    Occipital stroke ([place]), left 09/23/2017   Diabetes mellitus type II, non insulin dependent ([place])    Encounter for well adult exam with abnormal findings 07/31/2017   Venous insufficiency (chronic) (peripheral) with chronic lower extremity edema 04/04/2016   Junctional tachycardia ([place]) 03/18/2015   Exertional dyspnea 07/05/2014   Obesity (BMI 30-39.9) 05/04/2014   Benign essential tremor 05/04/2014   Peripheral neuropathy 12/21/2013   Nonspecific abnormal finding in stool contents 09/06/2012   Heme positive stool 08/09/2012   Difficulty passing stool 08/09/2012   Sebaceous cyst 04/15/2012   Complex renal cyst 01/13/2011   Back pain 01/02/2011   Permanent atrial fibrillation ([place]) - now off amiodarone; asymptomatic. CHA2DS2-VASc Score -5 (on Xarelto) 01/02/2011   KNEE PAIN, LEFT 08/27/2009   COLONIC POLYPS, HX OF 08/08/2008   ERECTILE DYSFUNCTION 09/27/2007   ULNAR NEUROPATHY 09/27/2007   VENTRICULAR HYPERTROPHY, LEFT 09/27/2007   ALLERGIC RHINITIS 09/27/2007   Arthropathy 09/27/2007   GANGLION CYST, WRIST, LEFT 09/27/2007   HEMORRHOIDS, INTERNAL 06/10/2007   DIVERTICULOSIS, COLON 06/10/2007   DISC DISEASE, LUMBAR 06/03/2007   NEPHROLITHIASIS, HX OF 06/03/2007   Diabetes mellitus with neuropathy ([place]) 02/11/2007   Hyperlipidemia with target LDL less than 100 02/11/2007   ANXIETY 02/11/2007   Nondependent Alcohol Abuse, in Remission 02/11/2007   Hypertensive heart disease with chronic diastolic congestive heart failure ([place]) 02/11/2007   COPD (chronic obstructive pulmonary disease) ([place]) 02/11/2007   BENIGN PROSTATIC HYPERTROPHY 02/11/2007   LOW BACK PAIN 02/11/2007   Disturbance of skin sensation 02/11/2007   PROSTATE CANCER, HX OF 02/11/2007   PCP:  Biagio Borg, MD Pharmacy:   [facility] [address] ([place]), [place] - [address] [place] [address] Phone: [phone] Fax: [place] ([facility] ) - [place], [place] [address] Phone: [phone] Fax: [phone]     Social Determinants of Health (SDOH) Interventions    Readmission Risk Interventions     No data to display

## 2022-01-29 DIAGNOSIS — I639 Cerebral infarction, unspecified: Secondary | ICD-10-CM | POA: Diagnosis not present

## 2022-01-29 LAB — GLUCOSE, CAPILLARY
Glucose-Capillary: 89 mg/dL (ref 70–99)
Glucose-Capillary: 127 mg/dL — ABNORMAL HIGH (ref 70–99)
Glucose-Capillary: 157 mg/dL — ABNORMAL HIGH (ref 70–99)
Glucose-Capillary: 97 mg/dL (ref 70–99)

## 2022-01-29 LAB — COMPREHENSIVE METABOLIC PANEL
ALT: 15 U/L (ref 0–44)
AST: 36 U/L (ref 15–41)
Albumin: 2 g/dL — ABNORMAL LOW (ref 3.5–5.0)
Alkaline Phosphatase: 87 U/L (ref 38–126)
Anion gap: 8 (ref 5–15)
BUN: 12 mg/dL (ref 8–23)
CO2: 20 mmol/L — ABNORMAL LOW (ref 22–32)
Calcium: 8 mg/dL — ABNORMAL LOW (ref 8.9–10.3)
Chloride: 110 mmol/L (ref 98–111)
Creatinine, Ser: 1.44 mg/dL — ABNORMAL HIGH (ref 0.61–1.24)
GFR, Estimated: 51 mL/min — ABNORMAL LOW (ref >60)
Glucose, Bld: 96 mg/dL (ref 70–99)
Potassium: 3.5 mmol/L (ref 3.5–5.1)
Sodium: 138 mmol/L (ref 135–145)
Total Bilirubin: 0.4 mg/dL (ref 0.3–1.2)
Total Protein: 7 g/dL (ref 6.5–8.1)

## 2022-01-29 LAB — PHOSPHORUS: Phosphorus: 2.6 mg/dL (ref 2.5–4.6)

## 2022-01-29 LAB — CULTURE, BLOOD (ROUTINE X 2): Culture: NO GROWTH

## 2022-01-29 LAB — MAGNESIUM: Magnesium: 1.8 mg/dL (ref 1.7–2.4)

## 2022-01-29 NOTE — Progress Notes (Signed)
PROGRESS NOTE    [name]  OIN:[id number] DOB: May 11, 1946 DOA: 01/24/2022 PCP: Biagio Borg, MD   Brief Narrative:  [name] is a 76 year old African-American male with prior history of diabetes, hypertension, hyperlipidemia, COPD, paroxysmal A-fib on anticoagulation with Eliquis and history of left occipital infarct 2 years ago.  Per his daughters report he wasn't himself 01/23/22, had a fall and has had urinary incontinence. He was last seen at [facility] 01/23/22. This AM his daughter reports he was dysarthric and had right sided weakness. EMS activated. His symptoms seemed to improve in transit t [facility].    ED Course: Code Stroke called by EMS in transit. Pt seen at the bridge by Dr. Leonie Man for neurology who reported that symptoms seemed to have cleared. He was probable outside the window for thrombolytics and symtoms were not c/w LVO event. Emergent CT head was negative. Vitals T 101.2  BP 158/106  HR 81  RR 22. Lab notable for glucose 106, Cr 1.79 (chronic CKD), CBCD nl, TSH nl, Troponin 94. CXR NAD, EKG w/ a. Fib at rate of 85, LVH, old anterior injury, question of recent inferior injury.    Neuro recommeds TRH admit to complete workup with MRI brain, MRA brain and neck, ECHO. In ED due to fever and abnl U/A 1G Rocephin administered. Appropriate cultures done.   Assessment & Plan:   Principal Problem:   CVA (cerebral vascular accident) ([place]) Active Problems:   Fever   Cough   Elevated troponin   Diabetes mellitus with neuropathy (HCC)   Hyperlipidemia with target LDL less than 100   Hypertensive heart disease with chronic diastolic congestive heart failure ([place])   Permanent atrial fibrillation ([place]) - now off amiodarone; asymptomatic. CHA2DS2-VASc Score -5 (on Xarelto)   Venous insufficiency (chronic) (peripheral) with chronic lower extremity edema   Chronic kidney disease, stage 3b (HCC)   HTN (hypertension)   Hypomagnesemia   COVID-19  Dysarthria/right-sided  weakness: Patient with multiple risk factors including DM, HTN,HLD, obesity. Symptoms were transient and improved by the time he arrived to the ED.  He was out of the window for tPA.  Admitted to hospital service.  Patient has no complaints and no focal deficit.  CT head negative.  MRI pending.  Echo shows 60 to 75% ejection fraction with no wall motion normality and no PFO..  PT OT evaluated patient and recommends SNF.  SLP on board as well.  Continue Eliquis.  MRI was ordered and 4 attempts were made to complete the MRI but reportedly patient had different issues each time which led to unsuccessful MRI completion, the issues included coughing spells or inability to lay flat due to kyphosis.  This happened despite of giving him Ativan as well as cough medications.  Neurology repeated CT head which was unremarkable.  They cleared him for discharge.  Patient has no focal deficit.   Sepsis secondary to UTI, POA: Patient meets criteria for sepsis due to fever of 101.2 as well as tachypnea and UTI.  Patient has remained afebrile for several days now.  Completed 5 days of Rocephin.     Elevated troponin/demand ischemia: Troponin elevated but essentially flat, echo ruled out wall motion abnormality.  Cough/incidental COVID-19 positive: Patient has been having some cough.  He was tested positive for COVID-19.  Per daughter, patient has not had any exposure other than the family members and all the family numbers are tested negative.  Chest x-ray negative and he is not hypoxic.  Discussed with the  daughter and started on molnupiravir.   HTN (hypertension): Blood pressure slightly elevated, continue metoprolol, amlodipine as well as as needed hydralazine.   Venous insufficiency (chronic) (peripheral) with chronic lower extremity edema/lymphedema Patient with chronic venous stasis dermatitis w/o open lesions. Also mycotic nails. No need for additional therapy at this time.    Permanent atrial fibrillation (HCC)  - now off amiodarone; asymptomatic. CHA2DS2-VASc Score -5 (on Xarelto).  On metoprolol.   Hypertensive heart disease with chronic diastolic congestive heart failure ([place]) Patient appears well compensated.  Echo shows LVH consistent with uncontrolled hypertension.  Hyperlipidemia: Now on Crestor.  Diabetes mellitus with neuropathy (HCC) Last A1C 2 months ago 6.6% and currently 6.5. No antiglycemic meds listed.  Blood sugar controlled on SSI.   CKD stage IIIb: At baseline.  Hypomagnesemia: Resolved.  Discharge planning: PT OT recommended SNF however initially patient was declining and was adamant about going home but finally after a lot of counseling and talk, he has agreed to go to SNF .  Patient and family has been provided with at least 5 options for him who can accept him within 10 days of being COVID-positive.  Patient once again is trying to change his mind.  His family is trying to convince him.  Waiting for them to make a final decision.  This morning when he worked with PT OT, he was noted to require max assist with 2.  They highly recommended that he goes to SNF.   DVT prophylaxis:   Eliquis   Code Status: Full Code  Family Communication: None present at bedside.  Plan of care discussed with patient in length and he/she verbalized understanding and agreed with it.  Status is: Inpatient Remains inpatient appropriate because: Medically stable waiting for placement.  Estimated body mass index is 18.99 kg/m as calculated from the following:   Height as of this encounter: '6\' 2"'$  (1.88 m).   Weight as of this encounter: 67.1 kg.  Pressure Injury 02/16/20 Sacrum Bilateral Stage 2 -  Partial thickness loss of dermis presenting as a shallow open injury with a red, pink wound bed without slough. (Active)  02/16/20 0400  Location: Sacrum  Location Orientation: Bilateral  Staging: Stage 2 -  Partial thickness loss of dermis presenting as a shallow open injury with a red, pink wound bed  without slough.  Wound Description (Comments):   Present on Admission: Yes     Pressure Injury 02/16/20 Right Stage 2 -  Partial thickness loss of dermis presenting as a shallow open injury with a red, pink wound bed without slough. (Active)  02/16/20 2200  Location:   Location Orientation: Right  Staging: Stage 2 -  Partial thickness loss of dermis presenting as a shallow open injury with a red, pink wound bed without slough.  Wound Description (Comments):   Present on Admission: Yes     Pressure Injury 02/16/20 Buttocks Right;Lateral Stage 2 -  Partial thickness loss of dermis presenting as a shallow open injury with a red, pink wound bed without slough. (Active)  02/16/20 2200  Location: Buttocks  Location Orientation: Right;Lateral  Staging: Stage 2 -  Partial thickness loss of dermis presenting as a shallow open injury with a red, pink wound bed without slough.  Wound Description (Comments):   Present on Admission: Yes   Nutritional Assessment: Body mass index is 18.99 kg/m.Marland Kitchen Seen by dietician.  I agree with the assessment and plan as outlined below: Nutrition Status:        . Skin  Assessment: I have examined the patient's skin and I agree with the wound assessment as performed by the wound care RN as outlined below: Pressure Injury 02/16/20 Sacrum Bilateral Stage 2 -  Partial thickness loss of dermis presenting as a shallow open injury with a red, pink wound bed without slough. (Active)  02/16/20 0400  Location: Sacrum  Location Orientation: Bilateral  Staging: Stage 2 -  Partial thickness loss of dermis presenting as a shallow open injury with a red, pink wound bed without slough.  Wound Description (Comments):   Present on Admission: Yes     Pressure Injury 02/16/20 Right Stage 2 -  Partial thickness loss of dermis presenting as a shallow open injury with a red, pink wound bed without slough. (Active)  02/16/20 2200  Location:   Location Orientation: Right   Staging: Stage 2 -  Partial thickness loss of dermis presenting as a shallow open injury with a red, pink wound bed without slough.  Wound Description (Comments):   Present on Admission: Yes     Pressure Injury 02/16/20 Buttocks Right;Lateral Stage 2 -  Partial thickness loss of dermis presenting as a shallow open injury with a red, pink wound bed without slough. (Active)  02/16/20 2200  Location: Buttocks  Location Orientation: Right;Lateral  Staging: Stage 2 -  Partial thickness loss of dermis presenting as a shallow open injury with a red, pink wound bed without slough.  Wound Description (Comments):   Present on Admission: Yes    Consultants:  Neurology  Procedures:  None  Antimicrobials:  Anti-infectives (From admission, onward)    Start     Dose/Rate Route Frequency Ordered Stop   01/25/22 1700  cefTRIAXone (ROCEPHIN) 1 g in sodium chloride 0.9 % 100 mL IVPB        1 g 200 mL/hr over 30 Minutes Intravenous Every 24 hours 01/24/22 1829 01/28/22 1759   01/25/22 1415  molnupiravir EUA (LAGEVRIO) capsule 800 mg        4 capsule Oral 2 times daily 01/25/22 1322 01/30/22 0959   01/25/22 1300  nirmatrelvir/ritonavir EUA (renal dosing) (PAXLOVID) 2 tablet  Status:  Discontinued        2 tablet Oral 2 times daily 01/25/22 1205 01/25/22 1321   01/24/22 1545  cefTRIAXone (ROCEPHIN) 1 g in sodium chloride 0.9 % 100 mL IVPB        1 g 200 mL/hr over 30 Minutes Intravenous  Once 01/24/22 1530 01/24/22 1717         Subjective: Seen and examined.  He has no complaints.  Objective: Vitals:   01/28/22 2334 01/29/22 0334 01/29/22 0830 01/29/22 1141  BP: (!) 148/94 (!) 136/94 (!) 161/83   Pulse: 80 81 93   Resp: 11 (!) 22 20   Temp: 98.2 F (36.8 C) 98 F (36.7 C) 98.5 F (36.9 C) 98.5 F (36.9 C)  TempSrc: Oral Oral Oral Oral  SpO2: 98% 96% 100%   Weight:      Height:        Intake/Output Summary (Last 24 hours) at 01/29/2022 1422 Last data filed at 01/29/2022  0400 Gross per 24 hour  Intake 3402.9 ml  Output 2700 ml  Net 702.9 ml    Filed Weights   01/24/22 2102  Weight: 67.1 kg    Examination:  General exam: Appears calm and comfortable, obese Respiratory system: Clear to auscultation. Respiratory effort normal. Cardiovascular system: S1 & S2 heard, RRR. No JVD, murmurs, rubs, gallops or clicks. No pedal edema.  Gastrointestinal system: Abdomen is nondistended, soft and nontender. No organomegaly or masses felt. Normal bowel sounds heard. Central nervous system: Alert and oriented. No focal neurological deficits. Extremities: Symmetric 5 x 5 power. Skin: No rashes, lesions or ulcers.  Psychiatry: Judgement and insight appear normal. Mood & affect appropriate.    Data Reviewed: I have personally reviewed following labs and imaging studies  CBC: Recent Labs  Lab 01/24/22 1300  WBC 6.8  NEUTROABS 4.8  HGB 13.3  HCT 42.5  MCV 86.4  PLT 629    Basic Metabolic Panel: Recent Labs  Lab 01/24/22 1440 01/25/22 0224 01/26/22 0057 01/27/22 1410 01/28/22 0633 01/29/22 0710  NA 135  --  135 134*  --  138  K 4.3  --  3.8 3.5 3.7 3.5  CL 102  --  105 105  --  110  CO2 20*  --  21* 21*  --  20*  GLUCOSE 106*  --  95 106*  --  96  BUN 20  --  19 14  --  12  CREATININE 1.79*  --  1.75* 1.51*  --  1.44*  CALCIUM 8.9  --  7.9* 8.0*  --  8.0*  MG  --  1.5* 1.9 1.7 1.9 1.8  PHOS  --   --   --   --   --  2.6    GFR: Estimated Creatinine Clearance: 42.1 mL/min (A) (by C-G formula based on SCr of 1.44 mg/dL (H)). Liver Function Tests: Recent Labs  Lab 01/24/22 1440 01/29/22 0710  AST 28 36  ALT 12 15  ALKPHOS 132* 87  BILITOT 0.7 0.4  PROT 8.8* 7.0  ALBUMIN 2.6* 2.0*    No results for input(s): "LIPASE", "AMYLASE" in the last 168 hours. Recent Labs  Lab 01/24/22 1440  AMMONIA 21    Coagulation Profile: Recent Labs  Lab 01/24/22 1433  INR 1.2    Cardiac Enzymes: No results for input(s): "CKTOTAL", "CKMB",  "CKMBINDEX", "TROPONINI" in the last 168 hours. BNP (last 3 results) No results for input(s): "PROBNP" in the last 8760 hours. HbA1C: No results for input(s): "HGBA1C" in the last 72 hours.  CBG: Recent Labs  Lab 01/28/22 1612 01/28/22 1727 01/28/22 2205 01/29/22 0635 01/29/22 1105  GLUCAP 107* 100* 129* 89 127*    Lipid Profile: No results for input(s): "CHOL", "HDL", "LDLCALC", "TRIG", "CHOLHDL", "LDLDIRECT" in the last 72 hours.  Thyroid Function Tests: No results for input(s): "TSH", "T4TOTAL", "FREET4", "T3FREE", "THYROIDAB" in the last 72 hours.  Anemia Panel: No results for input(s): "VITAMINB12", "FOLATE", "FERRITIN", "TIBC", "IRON", "RETICCTPCT" in the last 72 hours. Sepsis Labs: Recent Labs  Lab 01/24/22 1440 01/24/22 1825  LATICACIDVEN 3.0* 2.4*     Recent Results (from the past 240 hour(s))  Resp Panel by RT-PCR (Flu A&B, Covid) Anterior Nasal Swab     Status: Abnormal   Collection Time: 01/24/22 12:26 PM   Specimen: Anterior Nasal Swab  Result Value Ref Range Status   SARS Coronavirus 2 by RT PCR POSITIVE (A) NEGATIVE Final    Comment: (NOTE) SARS-CoV-2 target nucleic acids are DETECTED.  The SARS-CoV-2 RNA is generally detectable in upper respiratory specimens during the acute phase of infection. Positive results are indicative of the presence of the identified virus, but do not rule out bacterial infection or co-infection with other pathogens not detected by the test. Clinical correlation with patient history and other diagnostic information is necessary to determine patient infection status. The expected result is Negative.  Fact Sheet for Patients: EntrepreneurPulse.com.au  Fact Sheet for Healthcare Providers: IncredibleEmployment.be  This test is not yet approved or cleared by the [place] FDA and  has been authorized for detection and/or diagnosis of SARS-CoV-2 by FDA under an Emergency Use  Authorization (EUA).  This EUA will remain in effect (meaning this test can be used) for the duration of  the COVID-19 declaration under Section 564(b)(1) of the A ct, 21 U.S.C. section 360bbb-3(b)(1), unless the authorization is terminated or revoked sooner.     Influenza A by PCR NEGATIVE NEGATIVE Final   Influenza B by PCR NEGATIVE NEGATIVE Final    Comment: (NOTE) The Xpert Xpress SARS-CoV-2/FLU/RSV plus assay is intended as an aid in the diagnosis of influenza from Nasopharyngeal swab specimens and should not be used as a sole basis for treatment. Nasal washings and aspirates are unacceptable for Xpert Xpress SARS-CoV-2/FLU/RSV testing.  Fact Sheet for Patients: EntrepreneurPulse.com.au  Fact Sheet for Healthcare Providers: IncredibleEmployment.be  This test is not yet approved or cleared by the [place] FDA and has been authorized for detection and/or diagnosis of SARS-CoV-2 by FDA under an Emergency Use Authorization (EUA). This EUA will remain in effect (meaning this test can be used) for the duration of the COVID-19 declaration under Section 564(b)(1) of the Act, 21 U.S.C. section 360bbb-3(b)(1), unless the authorization is terminated or revoked.  Performed at [facility], [place] [address], [place], [place] [address]   Blood culture (routine x 2)     Status: None   Collection Time: 01/24/22  2:33 PM   Specimen: BLOOD RIGHT HAND  Result Value Ref Range Status   Specimen Description BLOOD RIGHT HAND  Final   Special Requests   Final    BOTTLES DRAWN AEROBIC AND ANAEROBIC Blood Culture results may not be optimal due to an inadequate volume of blood received in culture bottles   Culture   Final    NO GROWTH 5 DAYS Performed at [facility], [place] [address], [place], [place] [address]    Report Status 01/29/2022 FINAL  Final  Blood culture (routine x 2)     Status: None (Preliminary result)   Collection Time: 01/25/22   2:38 AM   Specimen: BLOOD RIGHT FOREARM  Result Value Ref Range Status   Specimen Description BLOOD RIGHT FOREARM  Final   Special Requests   Final    BOTTLES DRAWN AEROBIC ONLY Blood Culture adequate volume   Culture   Final    NO GROWTH 4 DAYS Performed at [facility], [place] [address] [place], [place] [address]    Report Status PENDING  Incomplete     Radiology Studies: CT HEAD WO CONTRAST (5MM)  Result Date: 01/27/2022 CLINICAL DATA:  Follow-up examination for possible stroke. EXAM: CT HEAD WITHOUT CONTRAST TECHNIQUE: Contiguous axial images were obtained from the base of the skull through the vertex without intravenous contrast. RADIATION DOSE REDUCTION: This exam was performed according to the departmental dose-optimization program which includes automated exposure control, adjustment of the mA and/or kV according to patient size and/or use of iterative reconstruction technique. COMPARISON:  Prior CT from 01/25/2022. FINDINGS: Brain: Age-related cerebral atrophy with moderate chronic small vessel ischemic disease. Chronic left PCA distribution infarct noted. No acute intracranial hemorrhage. No visible acute or evolving large vessel territory infarct. No mass lesion, midline shift or mass effect. No hydrocephalus or extra-axial fluid collection. Vascular: No hyperdense vessel. Scattered vascular calcifications noted within the carotid siphons. Skull: Scalp soft  tissues and calvarium within normal limits. Sinuses/Orbits: Globes and orbital soft tissues within normal limits. Moderate mucosal thickening in opacity present throughout the sphenoid ethmoidal and maxillary sinuses. Mastoid air cells remain clear. Other: None. IMPRESSION: 1. Stable head CT.  No acute intracranial abnormality. 2. Underlying atrophy with chronic small vessel ischemic disease with remote left PCA distribution infarct. Electronically Signed   By: Jeannine Boga M.D.   On: 01/27/2022 19:30    Scheduled  Meds:  amLODipine  10 mg Oral Daily   apixaban  5 mg Oral BID   carvedilol  3.125 mg Oral BID WC   furosemide  40 mg Oral Daily   insulin aspart  0-20 Units Subcutaneous TID WC   molnupiravir EUA  4 capsule Oral BID   potassium chloride  10 mEq Oral Daily   rosuvastatin  20 mg Oral Daily   Continuous Infusions:  sodium chloride 75 mL/hr at 01/28/22 2305     LOS: 3 days   Darliss Cheney, MD [facility]  01/29/2022, 2:22 PM   *Please note that this is a verbal dictation therefore any spelling or grammatical errors are due to the "[employer]" system interpretation.  Please page via [place] and do not message via secure chat for urgent patient care matters. Secure chat can be used for non urgent patient care matters.  How to contact the [facility] Attending or Consulting provider [place] or covering provider during after hours [place], for this patient?  Check the care team in [facility] and look for a) attending/consulting TR[facility] provider listed and b) the [facility] team listed. Page or secure chat [phone]. Log into [email or web address] and use [place]'s universal password to access. If you do not have the password, please contact the hospital operator. Locate the [facility] provider you are looking for under [facility] and page to a number that you can be directly reached. If you still have difficulty reaching the provider, please page the [facility] (Director on Call) for the Hospitalists listed on amion for assistance.

## 2022-01-29 NOTE — TOC Progression Note (Addendum)
Transition of Care [facility]) - Progression Note    Patient Details  Name: [name] MRN: [id number] Date of Birth: 1946-01-16  Transition of Care [facility]) CM/SW [place], [place] Phone Number: 01/29/2022, 12:52 PM  Clinical Narrative:     CSW followed up with pt's daughter [name] to discuss SNF choice. [name] noted that she spoke with her father at length and he really wants to go home not SNF. CSW  went over most recent PT note with daughter and discussed seeing him be transferred to determine if taking him home would be a possibility. Daughter plans on visiting this afternoon, RN and PT notified, and requested their assistance and opinion, their input is appreciated.  If family decides to pursue SNF, they are interested in [facility], however, it is very far away for them, so they would like to avoid it. CSW will meet daughter and pt in the room to discuss disposition.   3:55 CSW spoke with [place] who noted family has decided to pursue [facility]. Facility notified and should have a bed tomorrow. Authorization started. Plan to DC tomorrow with authorization. TOC will continue to follow for DC needs. Expected Discharge Plan: Skilled Nursing Facility Barriers to Discharge: SNF Pending bed offer, Insurance Authorization, SNF Covid  Expected Discharge Plan and Services Expected Discharge Plan: [place] arrangements for the past 2 months: Single Family Home                                       Social Determinants of Health (SDOH) Interventions    Readmission Risk Interventions     No data to display

## 2022-01-29 NOTE — Progress Notes (Signed)
Overnight progress note  Notified by RN this morning that patient had a 29 beat run of V. tach on telemetry but remained asymptomatic and vital signs stable.    I have personally reveiwed telemetry: Multiple brief runs of NSVT, however, longest one lasting 28 beats.  Patient remains asymptomatic.  Stat EKG done:    Chart reviewed, patient with history of permanent A-fib and currently on Coreg 3.125 mg twice daily.  -Stat labs ordered to check potassium, magnesium, calcium, and phosphorus levels.  -Consult cardiology in the morning.

## 2022-01-29 NOTE — Progress Notes (Signed)
Occupational Therapy Treatment Patient Details Name: [name] MRN: [id number] DOB: 1945/09/07 Today's Date: 01/29/2022   History of present illness 76 y/o male presented to ED on 01/24/22 as code stroke for L facial droop and slurred speech. CT head limited by motion but no definite acute abnormality. PMH: hx of L occipital PCA CVA, hx of prostate cancer, T2DM with neuropathy, COPD, Afib, CKD, HTN   OT comments  Pt seen in conjunction with PT to maxmize activity tolerance and optimize particiption. Pt continues to present with imparied balance, decreased activity tolerance and R sided weakness. Pt currently requires MINA +2 for bed mobility with + time and effort as pt reports fatigue/SOB with minimal movement and MIN A + 2 to stand from EOB and recliner with use of stedy. Pt on RA during session with SpO2 > 97% HR max 125 bpm. Pt would continue to benefit from skilled occupational therapy while admitted and after d/c to address the below listed limitations in order to improve overall functional mobility and facilitate independence with BADL participation. DC plan remains appropriate, will follow acutely per POC.     Recommendations for follow up therapy are one component of a multi-disciplinary discharge planning process, led by the attending physician.  Recommendations may be updated based on patient status, additional functional criteria and insurance authorization.    Follow Up Recommendations  Skilled nursing-short term rehab (<3 hours/day)    Assistance Recommended at Discharge Frequent or constant Supervision/Assistance  Patient can return home with the following  Two people to help with walking and/or transfers;Two people to help with bathing/dressing/bathroom;Assistance with cooking/housework;Assistance with feeding;Direct supervision/assist for medications management;Direct supervision/assist for financial management;Assist for transportation;Help with stairs or ramp for  entrance   Equipment Recommendations  BSC/3in1;Wheelchair (measurements OT);Wheelchair cushion (measurements OT)    Recommendations for Other Services      Precautions / Restrictions Precautions Precautions: Fall;Other (comment) Precaution Comments: Covid Restrictions Weight Bearing Restrictions: No       Mobility Bed Mobility Overal bed mobility: Needs Assistance Bed Mobility: Supine to Sit     Supine to sit: Min assist, +2 for safety/equipment, HOB elevated     General bed mobility comments: Assist to elevate trunk into sitting and bring hips to EOB    Transfers Overall transfer level: Needs assistance Equipment used: Ambulation equipment used Transfers: Sit to/from Stand, Bed to chair/wheelchair/BSC Sit to Stand: +2 physical assistance, Min assist           General transfer comment: Stood from elevated bed with +2 min assist to bring hips up and for balance. Used Stedy for bed to chair. Stood from chair with Stedy with +2 min assist but a little heavier lift and pt with more effort Transfer via Lift Equipment: Stedy   Balance Overall balance assessment: Needs assistance Sitting-balance support: Bilateral upper extremity supported, Feet supported Sitting balance-Leahy Scale: Poor Sitting balance - Comments: UE suupport. One posterior loss of balance initially while pt trying to readjust   Standing balance support: Bilateral upper extremity supported Standing balance-Leahy Scale: Poor Standing balance comment: Stedy and +2 min assist for static standing                           ADL either performed or assessed with clinical judgement   ADL  Functional mobility during ADLs: Minimal assistance;+2 for physical assistance;+2 for safety/equipment (sit>stand to stedy from elevated EOB) General ADL Comments: session focus on OOB tolerance, increasing activity tolerance, and functional mobility     Extremity/Trunk Assessment Upper Extremity Assessment Upper Extremity Assessment: Generalized weakness (RUE weaker than LUE but overall WFL)   Lower Extremity Assessment Lower Extremity Assessment: Defer to PT evaluation        Vision Patient Visual Report: Peripheral vision impairment;Other (comment) (film" over his eyes; hx of R HH)     Perception Perception Perception: Not tested   Praxis Praxis Praxis: Not tested    Cognition Arousal/Alertness: Awake/alert Behavior During Therapy: WFL for tasks assessed/performed Overall Cognitive Status: Impaired/Different from baseline Area of Impairment: Attention, Memory, Following commands, Safety/judgement, Awareness, Problem solving                   Current Attention Level: Selective Memory: Decreased short-term memory Following Commands: Follows one step commands with increased time Safety/Judgement: Decreased awareness of safety, Decreased awareness of deficits Awareness: Intellectual Problem Solving: Slow processing, Requires verbal cues General Comments: impaired awareness into deficits reporting to MD that he feels like he could return home even though pt needed +2 assist to use stedy        Exercises Other Exercises Other Exercises: able to sit<>stand from low recliner with stedy and MINA +2    Shoulder Instructions       General Comments VSS on RA, Spo2 >97% HR max 125 bpm    Pertinent Vitals/ Pain       Pain Assessment Pain Assessment: Faces Faces Pain Scale: Hurts a little bit Pain Location: back Pain Descriptors / Indicators: Discomfort, Grimacing Pain Intervention(s): Monitored during session, Repositioned  Home Living                                          Prior Functioning/Environment              Frequency  Min 2X/week        Progress Toward Goals  OT Goals(current goals can now be found in the care plan section)  Progress towards OT goals: Progressing  toward goals  Acute Rehab OT Goals Patient Stated Goal: to go home OT Goal Formulation: With patient Time For Goal Achievement: 02/08/22 Potential to Achieve Goals: Good  Plan Discharge plan remains appropriate;Frequency remains appropriate    Co-evaluation      Reason for Co-Treatment: Complexity of the patient's impairments (multi-system involvement);For patient/therapist safety;To address functional/ADL transfers PT goals addressed during session: Mobility/safety with mobility        AM-PAC OT "6 Clicks" Daily Activity     Outcome Measure   Help from another person eating meals?: A Little Help from another person taking care of personal grooming?: A Lot Help from another person toileting, which includes using toliet, bedpan, or urinal?: A Lot Help from another person bathing (including washing, rinsing, drying)?: A Lot Help from another person to put on and taking off regular upper body clothing?: A Lot Help from another person to put on and taking off regular lower body clothing?: A Lot 6 Click Score: 13    End of Session Equipment Utilized During Treatment: Gait belt;Other (comment) (stedy)  OT Visit Diagnosis: Unsteadiness on feet (R26.81);Other abnormalities of gait and mobility (R26.89);Muscle weakness (generalized) (M62.81);History of falling (Z91.81);Other symptoms and signs involving cognitive function;Pain Pain -  part of body:  (back)   Activity Tolerance Patient tolerated treatment well   Patient Left in chair;with call bell/phone within reach;with chair alarm set   Nurse Communication Mobility status;Other (comment) (+ 2 with stedy back to bed)        Time: 0160-1093 OT Time Calculation (min): 25 min  Charges: OT General Charges $OT Visit: 1 Visit OT Treatments $Therapeutic Exercise: 8-22 mins  Harley Alto., COTA/L Acute Rehabilitation Services [phone]  Precious Haws 01/29/2022, 12:27 PM

## 2022-01-29 NOTE — Progress Notes (Signed)
Physical Therapy Treatment Patient Details Name: [name] MRN: [id number] DOB: May 29, 1946 Today's Date: 01/29/2022   History of Present Illness 76 y/o male presented to ED on 01/24/22 as code stroke for L facial droop and slurred speech. CT head limited by motion but no definite acute abnormality. PMH: hx of L occipital PCA CVA, hx of prostate cancer, T2DM with neuropathy, COPD, Afib, CKD, HTN    PT Comments    Pt making steady progress and was able to stand and get OOB for the first time with the [facility]. Continue to feel pt needs ST-SNF at DC to work toward more independence prior to return home.    Recommendations for follow up therapy are one component of a multi-disciplinary discharge planning process, led by the attending physician.  Recommendations may be updated based on patient status, additional functional criteria and insurance authorization.  Follow Up Recommendations  Skilled nursing-short term rehab (<3 hours/day) Can patient physically be transported by private vehicle: No   Assistance Recommended at Discharge Frequent or constant Supervision/Assistance  Patient can return home with the following Two people to help with walking and/or transfers;A lot of help with bathing/dressing/bathroom   Equipment Recommendations  Other (comment) (TBD)    Recommendations for Other Services       Precautions / Restrictions Precautions Precautions: Fall;Other (comment) Precaution Comments: Covid Restrictions Weight Bearing Restrictions: No     Mobility  Bed Mobility Overal bed mobility: Needs Assistance Bed Mobility: Supine to Sit     Supine to sit: Min assist, +2 for safety/equipment, HOB elevated     General bed mobility comments: Assist to elevate trunk into sitting and bring hips to EOB    Transfers Overall transfer level: Needs assistance Equipment used: Ambulation equipment used Transfers: Sit to/from Stand, Bed to chair/wheelchair/BSC Sit to Stand: +2  physical assistance, Min assist           General transfer comment: Stood from elevated bed with +2 min assist to bring hips up and for balance. Used Stedy for bed to chair. Stood from chair with Stedy with +2 min assist but a little heavier lift and pt with more effort Transfer via Lift Equipment: Stedy  Ambulation/Gait             Pre-gait activities: Stood in [employer] x 30 sec     Stairs             Wheelchair Mobility    Modified Rankin (Stroke Patients Only)       Balance Overall balance assessment: Needs assistance Sitting-balance support: Bilateral upper extremity supported, Feet supported Sitting balance-Leahy Scale: Poor Sitting balance - Comments: UE suupport. One posterior loss of balance initially while pt trying to readjust   Standing balance support: Bilateral upper extremity supported Standing balance-Leahy Scale: Poor Standing balance comment: Stedy and +2 min assist for static standing                            Cognition Arousal/Alertness: Awake/alert Behavior During Therapy: WFL for tasks assessed/performed Overall Cognitive Status: Impaired/Different from baseline Area of Impairment: Attention, Memory, Following commands, Safety/judgement, Awareness, Problem solving                   Current Attention Level: Selective Memory: Decreased short-term memory Following Commands: Follows one step commands with increased time Safety/Judgement: Decreased awareness of safety, Decreased awareness of deficits Awareness: Intellectual Problem Solving: Slow processing, Requires verbal cues  Exercises      General Comments General comments (skin integrity, edema, etc.): VSS on RA      Pertinent Vitals/Pain Pain Assessment Pain Assessment: Faces Faces Pain Scale: Hurts a little bit Pain Location: back Pain Descriptors / Indicators: Discomfort Pain Intervention(s): Limited activity within patient's tolerance,  Monitored during session    Home Living                          Prior Function            PT Goals (current goals can now be found in the care plan section) Progress towards PT goals: Progressing toward goals    Frequency    Min 2X/week      PT Plan Current plan remains appropriate    Co-evaluation PT/OT/SLP Co-Evaluation/Treatment: Yes Reason for Co-Treatment: For patient/therapist safety PT goals addressed during session: Mobility/safety with mobility        AM-PAC PT "6 Clicks" Mobility   Outcome Measure  Help needed turning from your back to your side while in a flat bed without using bedrails?: A Little Help needed moving from lying on your back to sitting on the side of a flat bed without using bedrails?: A Little Help needed moving to and from a bed to a chair (including a wheelchair)?: Total Help needed standing up from a chair using your arms (e.g., wheelchair or bedside chair)?: Total Help needed to walk in hospital room?: Total Help needed climbing 3-5 steps with a railing? : Total 6 Click Score: 10    End of Session   Activity Tolerance: Patient tolerated treatment well Patient left: with call bell/phone within reach;in chair;with chair alarm set Nurse Communication: Mobility status;Other (comment) (Bed switched out while pt in chair due to bed malfuntioning) PT Visit Diagnosis: Unsteadiness on feet (R26.81);Muscle weakness (generalized) (M62.81);Difficulty in walking, not elsewhere classified (R26.2)     Time: 1856-3149 PT Time Calculation (min) (ACUTE ONLY): 25 min  Charges:  $Therapeutic Activity: 8-22 mins                     [place] 01/29/2022, 10:57 AM

## 2022-01-29 NOTE — Progress Notes (Signed)
Pt had  29 beats of VTACH, asymptomatic, no complaints, notified Dr. Marlowe Sax. Will continue to monitor.

## 2022-01-30 ENCOUNTER — Ambulatory Visit: Payer: Medicare Other | Admitting: Physical Therapy

## 2022-01-30 DIAGNOSIS — R609 Edema, unspecified: Secondary | ICD-10-CM | POA: Diagnosis not present

## 2022-01-30 DIAGNOSIS — G8929 Other chronic pain: Secondary | ICD-10-CM | POA: Diagnosis not present

## 2022-01-30 DIAGNOSIS — I509 Heart failure, unspecified: Secondary | ICD-10-CM | POA: Diagnosis not present

## 2022-01-30 DIAGNOSIS — N183 Chronic kidney disease, stage 3 unspecified: Secondary | ICD-10-CM | POA: Diagnosis not present

## 2022-01-30 DIAGNOSIS — C9000 Multiple myeloma not having achieved remission: Secondary | ICD-10-CM | POA: Diagnosis not present

## 2022-01-30 DIAGNOSIS — I872 Venous insufficiency (chronic) (peripheral): Secondary | ICD-10-CM | POA: Diagnosis not present

## 2022-01-30 DIAGNOSIS — E559 Vitamin D deficiency, unspecified: Secondary | ICD-10-CM | POA: Diagnosis not present

## 2022-01-30 DIAGNOSIS — J449 Chronic obstructive pulmonary disease, unspecified: Secondary | ICD-10-CM | POA: Diagnosis not present

## 2022-01-30 DIAGNOSIS — Z743 Need for continuous supervision: Secondary | ICD-10-CM | POA: Diagnosis not present

## 2022-01-30 DIAGNOSIS — I10 Essential (primary) hypertension: Secondary | ICD-10-CM | POA: Diagnosis not present

## 2022-01-30 DIAGNOSIS — E119 Type 2 diabetes mellitus without complications: Secondary | ICD-10-CM | POA: Diagnosis not present

## 2022-01-30 DIAGNOSIS — I480 Paroxysmal atrial fibrillation: Secondary | ICD-10-CM | POA: Diagnosis not present

## 2022-01-30 DIAGNOSIS — Z7401 Bed confinement status: Secondary | ICD-10-CM | POA: Diagnosis not present

## 2022-01-30 DIAGNOSIS — M1990 Unspecified osteoarthritis, unspecified site: Secondary | ICD-10-CM | POA: Diagnosis not present

## 2022-01-30 DIAGNOSIS — E1159 Type 2 diabetes mellitus with other circulatory complications: Secondary | ICD-10-CM | POA: Diagnosis not present

## 2022-01-30 DIAGNOSIS — E785 Hyperlipidemia, unspecified: Secondary | ICD-10-CM | POA: Diagnosis not present

## 2022-01-30 DIAGNOSIS — G459 Transient cerebral ischemic attack, unspecified: Secondary | ICD-10-CM | POA: Diagnosis not present

## 2022-01-30 DIAGNOSIS — N1832 Chronic kidney disease, stage 3b: Secondary | ICD-10-CM | POA: Diagnosis not present

## 2022-01-30 DIAGNOSIS — E1140 Type 2 diabetes mellitus with diabetic neuropathy, unspecified: Secondary | ICD-10-CM | POA: Diagnosis not present

## 2022-01-30 DIAGNOSIS — I639 Cerebral infarction, unspecified: Secondary | ICD-10-CM | POA: Diagnosis not present

## 2022-01-30 DIAGNOSIS — M549 Dorsalgia, unspecified: Secondary | ICD-10-CM | POA: Diagnosis not present

## 2022-01-30 DIAGNOSIS — I700 Atherosclerosis of aorta: Secondary | ICD-10-CM | POA: Diagnosis not present

## 2022-01-30 DIAGNOSIS — I5032 Chronic diastolic (congestive) heart failure: Secondary | ICD-10-CM | POA: Diagnosis not present

## 2022-01-30 DIAGNOSIS — I4729 Other ventricular tachycardia: Secondary | ICD-10-CM | POA: Diagnosis not present

## 2022-01-30 DIAGNOSIS — I482 Chronic atrial fibrillation, unspecified: Secondary | ICD-10-CM | POA: Diagnosis not present

## 2022-01-30 DIAGNOSIS — I152 Hypertension secondary to endocrine disorders: Secondary | ICD-10-CM | POA: Diagnosis not present

## 2022-01-30 DIAGNOSIS — M179 Osteoarthritis of knee, unspecified: Secondary | ICD-10-CM | POA: Diagnosis not present

## 2022-01-30 DIAGNOSIS — I4891 Unspecified atrial fibrillation: Secondary | ICD-10-CM | POA: Diagnosis not present

## 2022-01-30 DIAGNOSIS — I679 Cerebrovascular disease, unspecified: Secondary | ICD-10-CM | POA: Diagnosis not present

## 2022-01-30 DIAGNOSIS — A419 Sepsis, unspecified organism: Secondary | ICD-10-CM | POA: Diagnosis not present

## 2022-01-30 LAB — GLUCOSE, CAPILLARY
Glucose-Capillary: 98 mg/dL (ref 70–99)
Glucose-Capillary: 112 mg/dL — ABNORMAL HIGH (ref 70–99)

## 2022-01-30 LAB — BASIC METABOLIC PANEL
Anion gap: 5 (ref 5–15)
BUN: 11 mg/dL (ref 8–23)
CO2: 20 mmol/L — ABNORMAL LOW (ref 22–32)
Calcium: 7.8 mg/dL — ABNORMAL LOW (ref 8.9–10.3)
Chloride: 113 mmol/L — ABNORMAL HIGH (ref 98–111)
Creatinine, Ser: 1.44 mg/dL — ABNORMAL HIGH (ref 0.61–1.24)
GFR, Estimated: 51 mL/min — ABNORMAL LOW (ref >60)
Glucose, Bld: 98 mg/dL (ref 70–99)
Potassium: 3.2 mmol/L — ABNORMAL LOW (ref 3.5–5.1)
Sodium: 138 mmol/L (ref 135–145)

## 2022-01-30 LAB — CULTURE, BLOOD (ROUTINE X 2)
Culture: NO GROWTH
Special Requests: ADEQUATE

## 2022-01-30 LAB — MAGNESIUM: Magnesium: 1.8 mg/dL (ref 1.7–2.4)

## 2022-01-30 MED ORDER — POTASSIUM CHLORIDE CRYS ER 20 MEQ PO TBCR
40.0000 meq | EXTENDED_RELEASE_TABLET | Freq: Once | ORAL | Status: AC
Start: 2022-01-30 — End: 2022-01-30
  Administered 2022-01-30: 40 meq via ORAL
  Filled 2022-01-30: qty 2

## 2022-01-30 MED ORDER — DILTIAZEM HCL ER 120 MG PO TB24: 120.0000 mg | ORAL_TABLET | Freq: Every day | ORAL | 0 refills | Status: DC

## 2022-01-30 MED ORDER — POTASSIUM CHLORIDE CRYS ER 20 MEQ PO TBCR: 40.0000 meq | EXTENDED_RELEASE_TABLET | Freq: Two times a day (BID) | ORAL | Status: DC

## 2022-01-30 MED ORDER — POTASSIUM CHLORIDE CRYS ER 20 MEQ PO TBCR
40.0000 meq | EXTENDED_RELEASE_TABLET | ORAL | Status: AC
  Administered 2022-01-30 (×2): 40 meq via ORAL
  Filled 2022-01-30 (×2): qty 2

## 2022-01-30 MED ORDER — CARVEDILOL 3.125 MG PO TABS: 3.1250 mg | ORAL_TABLET | Freq: Two times a day (BID) | ORAL | 0 refills | Status: DC

## 2022-01-30 MED ORDER — ROSUVASTATIN CALCIUM 20 MG PO TABS: 20.0000 mg | ORAL_TABLET | Freq: Every day | ORAL | 0 refills | Status: DC

## 2022-01-30 NOTE — Consult Note (Signed)
   [facility] CM Inpatient Consult   01/30/2022  Amil Brault 09-Jun-1946 [id number]  [employer] [ACO] Patient: [employer] Medicare  Primary Care Provider:  Biagio Borg, MD is with [facility], an Embedded provider that is listed to provide the [facility] follow up.  If the patient goes to a [facility] affiliated facility then, patient can be followed by [facility] PAC RN with traditional Medicare.    Plan:   [facility] PAC RN can follow for any known or needs for transitional care needs for returning to post facility care or complex disease management.  For questions or referrals, please contact:   Natividad Brood, RN BSN [facility] Liaison  [phone] business mobile phone Toll free office [phone]  Fax number: [phone] [email or web address]

## 2022-01-30 NOTE — TOC Transition Note (Signed)
Transition of Care [facility]) - CM/SW Discharge Note   Patient Details  Name: Onesimo Lingard MRN: [id number] Date of Birth: 1945-10-15  [facility] CM/SW Contact:  Geralynn Ochs, LCSW Phone Number: 01/30/2022, 2:00 PM   Clinical Narrative:   CSW confirmed that insurance authorization had been approved and [facility] had a bed available. CSW updated MD and confirmed medical stability. CSW sent discharge information to [facility]. CSW updated daughter [name] via phone who is in agreement. Transport scheduled with [facility] for next available.  Nurse to call report to [phone], Room 309B.    Final next level of care: Skilled Nursing Facility Barriers to Discharge: Barriers Resolved   Patient Goals and CMS Choice        Discharge Placement              Patient chooses bed at: [place] Patient to be transferred to facility by: [place] Name of family member notified: [name] Patient and family notified of of transfer: 01/30/22  Discharge Plan and Services                                     Social Determinants of Health (SDOH) Interventions     Readmission Risk Interventions     No data to display

## 2022-01-30 NOTE — Progress Notes (Signed)
Pt had a 12 beat run of vtach while sleeping then back to afib 60s-70s. Noted the night before he had a 29 beat run of vtach and labs and an EKG were done.   Night MD notified and labs ordered for today. After labs PO potassium given per order for potassium level of 3.2

## 2022-01-30 NOTE — Progress Notes (Signed)
Attempted report to [facility] x2.

## 2022-01-30 NOTE — Discharge Summary (Signed)
PatientPhysician Discharge Summary  [name] ZHY:[id number] DOB: 1946/05/02 DOA: 01/24/2022  PCP: Biagio Borg, MD  Admit date: 01/24/2022 Discharge date: 01/30/2022 30 Day Unplanned Readmission Risk Score    Flowsheet Row ED to Hosp-Admission (Current) from 01/24/2022 in [facility]  30 Day Unplanned Readmission Risk Score (%) 17.8 Filed at 01/30/2022 0801       This score is the patient's risk of an unplanned readmission within 30 days of being discharged (0 -100%). The score is based on dignosis, age, lab data, medications, orders, and past utilization.   Low:  0-14.9   Medium: 15-21.9   High: 22-29.9   Extreme: 30 and above          Admitted From: Home Disposition: SNF  Recommendations for Outpatient Follow-up:  Follow up with PCP in 1-2 weeks Please obtain BMP/CBC in one week Please follow up with your PCP on the following pending results: Unresulted Labs (From admission, onward)    None         Home Health: None Equipment/Devices: None  Discharge Condition: Stable CODE STATUS: Full code Diet recommendation: Cardiac  Subjective: Seen and examined.  No complaints.  He is ready and agreeable to go to SNF today.  Brief/Interim Summary: [name] is a 76 year old African-American male with prior history of diabetes, hypertension, hyperlipidemia, COPD, paroxysmal A-fib on anticoagulation with Eliquis and history of left occipital infarct 2 years ago.  Per his daughters report he wasn't himself 01/23/22, had a fall and has had urinary incontinence. He was last seen at [facility] 01/23/22. This AM his daughter reports he was dysarthric and had right sided weakness. EMS activated. His symptoms seemed to improve in transit t [facility].    ED Course: Code Stroke called by EMS in transit. Pt seen at the bridge by Dr. Leonie Man for neurology who reported that symptoms seemed to have cleared. He was probable outside the window for thrombolytics and symtoms were not c/w  LVO event. Emergent CT head was negative. Vitals T 101.2  BP 158/106  HR 81  RR 22. Lab notable for glucose 106, Cr 1.79 (chronic CKD), CBCD nl, TSH nl, Troponin 94. CXR NAD, EKG w/ a. Fib at rate of 85, LVH, old anterior injury, question of recent inferior injury.    Neuro recommeded [facility] admit to complete workup with MRI brain, MRA brain and neck, ECHO. In ED due to fever and abnl U/A 1G Rocephin administered. Appropriate cultures done.  He was admitted with the following and details as below.   Dysarthria/right-sided weakness/TIA: Patient with multiple risk factors including DM, HTN,HLD, obesity. Symptoms were transient and improved by the time he arrived to the ED.  He was out of the window for tPA.  Admitted to hospital service.  Patient has no complaints and no focal deficit.  CT head negative.  Echo shows 37 to 75% ejection fraction with no wall motion normality and no PFO..  PT OT evaluated patient and recommends SNF.  SLP on board as well.  Continue Eliquis.  MRI was ordered and 4 attempts were made to complete the MRI but reportedly patient had different issues each time which led to unsuccessful MRI completion, the issues included coughing spells or inability to lay flat due to kyphosis.  This happened despite of giving him Ativan as well as cough medications.  Neurology repeated CT head after few days which was unremarkable.  They cleared him for discharge.  Patient has no focal deficit.  He is already  on anticoagulation.   Sepsis secondary to UTI, POA: Patient meets criteria for sepsis due to fever of 101.2 as well as tachypnea and UTI.  Patient has remained afebrile for several days now.  Completed 5 days of Rocephin.     Elevated troponin/demand ischemia: Troponin elevated but essentially flat, echo ruled out wall motion abnormality.   Cough/incidental COVID-19 positive: Patient has been having some cough.  He was tested positive for COVID-19.  Per daughter, patient has not had any exposure  other than the family members and all the family numbers are tested negative.  Chest x-ray negative and he is not hypoxic.  Discussed with the daughter and started on molnupiravir.  The course was completed today.   HTN (hypertension): Blood pressure slightly elevated, for that reason, his metoprolol was switched to Coreg and his blood pressure is now fairly controlled.  Also, he is amlodipine is being transition to diltiazem for V. tach, see below.  Venous insufficiency (chronic) (peripheral) with chronic lower extremity edema/lymphedema Patient with chronic venous stasis dermatitis w/o open lesions. Also mycotic nails. No need for additional therapy at this time.    Permanent atrial fibrillation (HCC) - now off amiodarone; asymptomatic. CHA2DS2-VASc Score -5 (on Xarelto).  On Coreg now.   Hypertensive heart disease with chronic diastolic congestive heart failure ([place]) Patient appears well compensated.  Echo shows LVH consistent with uncontrolled hypertension.   Hyperlipidemia: Now on Crestor.   Diabetes mellitus with neuropathy (HCC) Last A1C 2 months ago 6.6% and currently 6.5. No antiglycemic meds listed.  Blood sugar controlled on SSI.   CKD stage IIIb: At baseline.   Hypomagnesemia: Resolved.  Hypokalemia: 3.2 today.  Will replace before discharge.  Bilateral lower extremity edema: For some reason, patient was on Lasix PTA, he has +2 pitting edema bilateral lower extremity, I am resuming Lasix at discharge.  Asymptomatic and nonsustained intermittent V. tach: Per reports from nurses, patient has had 2-3 episodes of nonsustained V. tach in the last 2 to 3 days and during all those episodes, patient has remained asymptomatic. I am discharging this patient on Cardizem CD 120 mg p.o. daily.   Discharge planning: PT OT recommended SNF however initially patient was declining and was adamant about going home but finally after a lot of counseling and talk, he has agreed to go to SNF .   Patient and family has been provided with at least 5 options for him who can accept him within 10 days of being COVID-positive.  Patient once again is trying to change his mind.  His family is trying to convince him.  Waiting for them to make a final decision.  This morning when he worked with PT OT, he was noted to require max assist with 2.  They highly recommended that he goes to SNF.Finally family and patient has agreed and he is going to be discharged to SNF today.  Discharge plan was discussed with patient and/or family member and they verbalized understanding and agreed with it.  Discharge Diagnoses:  Principal Problem:   CVA (cerebral vascular accident) ([place]) Active Problems:   Fever   Cough   Elevated troponin   Diabetes mellitus with neuropathy (HCC)   Hyperlipidemia with target LDL less than 100   Hypertensive heart disease with chronic diastolic congestive heart failure ([place])   Permanent atrial fibrillation ([place]) - now off amiodarone; asymptomatic. CHA2DS2-VASc Score -5 (on Xarelto)   Venous insufficiency (chronic) (peripheral) with chronic lower extremity edema   Chronic kidney disease, stage 3b (  [place])   HTN (hypertension)   Hypomagnesemia   COVID-19    Discharge Instructions   Allergies as of 01/30/2022       Reactions   Ciprofloxacin Other (See Comments)   All over weakness        Medication List     STOP taking these medications    amLODipine 10 MG tablet Commonly known as: NORVASC   metoprolol tartrate 25 MG tablet Commonly known as: LOPRESSOR   pravastatin 40 MG tablet Commonly known as: PRAVACHOL       TAKE these medications    acetaminophen 325 MG tablet Commonly known as: TYLENOL Take 2 tablets (650 mg total) by mouth every 6 (six) hours as needed for mild pain or moderate pain.   carvedilol 3.125 MG tablet Commonly known as: COREG Take 1 tablet (3.125 mg total) by mouth 2 (two) times daily with a meal.   diltiazem 120 MG 24 hr  tablet Commonly known as: CARDIZEM LA Take 1 tablet (120 mg total) by mouth daily.   Eliquis 5 MG Tabs tablet Generic drug: apixaban TAKE 1 TABLET BY MOUTH  TWICE DAILY What changed: how much to take   furosemide 40 MG tablet Commonly known as: LASIX TAKE 1 TABLET BY MOUTH DAILY .  MAY TAKE AN ADDITIONAL 1 TABLET  BY MOUTH IF NEEDED FOR SWELLING What changed: See the new instructions.   OneTouch Delica Plus [id number] Misc USE ONCE DAILY   OneTouch Delica Plus Lancing Misc   polyethylene glycol 17 g packet Commonly known as: MIRALAX / GLYCOLAX Take 17 g by mouth daily as needed for moderate constipation.   potassium chloride 10 MEQ tablet Commonly known as: KLOR-CON Take 1 tablet (10 mEq total) by mouth daily.   ProAir HFA 108 (90 Base) MCG/ACT inhaler Generic drug: albuterol INHALE 2 PUFFS INTO THE  LUNGS EVERY 6 HOURS AS  NEEDED What changed: reasons to take this   rosuvastatin 20 MG tablet Commonly known as: CRESTOR Take 1 tablet (20 mg total) by mouth daily. Start taking on: January 31, 2022        Contact information for follow-up providers     [facility]. Schedule an appointment as soon as possible for a visit in 1 month(s).   Specialty: Neurology Contact information: [address] [place] [phone]        Biagio Borg, MD Follow up in 1 week(s).   Specialties: Internal Medicine, Radiology Contact information: [place] [address] [phone]         Leonie Man, MD .   Specialty: Cardiology Contact information: [address] [place] [address] [phone]              Contact information for after-discharge care     [facility] .   Service: Skilled Nursing Contact information: [place] [phone]                    Allergies   Allergen Reactions   Ciprofloxacin Other (See Comments)    All over weakness    Consultations: Neurology   Procedures/Studies: CT HEAD WO CONTRAST (5MM)  Result Date: 01/27/2022 CLINICAL DATA:  Follow-up examination for possible stroke. EXAM: CT HEAD WITHOUT CONTRAST TECHNIQUE: Contiguous axial images were obtained from the base of the skull through the vertex without intravenous contrast. RADIATION DOSE REDUCTION:  This exam was performed according to the departmental dose-optimization program which includes automated exposure control, adjustment of the mA and/or kV according to patient size and/or use of iterative reconstruction technique. COMPARISON:  Prior CT from 01/25/2022. FINDINGS: Brain: Age-related cerebral atrophy with moderate chronic small vessel ischemic disease. Chronic left PCA distribution infarct noted. No acute intracranial hemorrhage. No visible acute or evolving large vessel territory infarct. No mass lesion, midline shift or mass effect. No hydrocephalus or extra-axial fluid collection. Vascular: No hyperdense vessel. Scattered vascular calcifications noted within the carotid siphons. Skull: Scalp soft tissues and calvarium within normal limits. Sinuses/Orbits: Globes and orbital soft tissues within normal limits. Moderate mucosal thickening in opacity present throughout the sphenoid ethmoidal and maxillary sinuses. Mastoid air cells remain clear. Other: None. IMPRESSION: 1. Stable head CT.  No acute intracranial abnormality. 2. Underlying atrophy with chronic small vessel ischemic disease with remote left PCA distribution infarct. Electronically Signed   By: Jeannine Boga M.D.   On: 01/27/2022 19:30   EEG adult  Result Date: 01/25/2022 Derek Jack, MD     01/25/2022  7:02 PM Routine EEG Report [name] is a 76 y.o. male with a history of prior stroke and waxing/waning mental status who is undergoing an EEG to evaluate for seizures. Report: This EEG was  acquired with electrodes placed according to the International 10-20 electrode system (including Fp1, Fp2, F3, F4, C3, C4, P3, P4, O1, O2, T3, T4, T5, T6, A1, A2, Fz, Cz, Pz). The following electrodes were missing or displaced: none. The occipital dominant rhythm was 5-6 Hz with intermittent more severe slowing diffusely. This activity is reactive to stimulation. Drowsiness was manifested by background fragmentation; deeper stages of sleep were not identified. There was no focal slowing. There were no interictal epileptiform discharges. There were no electrographic seizures identified. Photic stimulation and hyperventilation were not performed. Impression and clinical correlation: This EEG was obtained while awake and drowsy and is abnormal due to moderate diffuse slowing indicative of global cerebral dysfunction. Epileptiform abnormalities were not seen during this recording. Su Monks, MD [facility] [phone] If 7pm- 7am, please page neurology on call as listed in [place].   ECHOCARDIOGRAM COMPLETE  Result Date: 01/25/2022    ECHOCARDIOGRAM REPORT   Patient Name:   [name] Date of Exam: 01/25/2022 Medical Rec #:  [id number]           Height:       74.0 in Accession #:    [id number]          Weight:       147.9 lb Date of Birth:  Sep 09, 1945           BSA:          1.912 m Patient Age:    42 years            BP:           174/78 mmHg Patient Gender: M                   HR:           68 bpm. Exam Location:  Inpatient Procedure: 2D Echo, Cardiac Doppler and Color Doppler Indications:    Stroke I63.9  History:        Patient has prior history of Echocardiogram examinations, most                 recent 02/17/2020. COPD, Arrythmias:Atrial Fibrillation; Risk  Factors:Hypertension, Dyslipidemia, Diabetes and ETOH.  Sonographer:    Bernadene Person RDCS Referring Phys: [place]  1. Left ventricular ejection fraction, by estimation, is 70 to 75%. The left  ventricle has hyperdynamic function. The left ventricle has no regional wall motion abnormalities. There is mild left ventricular hypertrophy. Left ventricular diastolic parameters are indeterminate.  2. Right ventricular systolic function is normal. The right ventricular size is mildly enlarged. There is normal pulmonary artery systolic pressure. The estimated right ventricular systolic pressure is 29.5 mmHg.  3. Left atrial size was severely dilated.  4. Right atrial size was moderately dilated.  5. The mitral valve is normal in structure. No evidence of mitral valve regurgitation. No evidence of mitral stenosis.  6. The aortic valve is normal in structure. Aortic valve regurgitation is not visualized. No aortic stenosis is present.  7. Aortic dilatation noted. There is mild dilatation of the aortic root, measuring 42 mm. There is mild dilatation of the ascending aorta, measuring 41 mm.  8. The inferior vena cava is dilated in size with >50% respiratory variability, suggesting right atrial pressure of 8 mmHg. Comparison(s): Prior images reviewed side by side. Conclusion(s)/Recommendation(s): No intracardiac source of embolism detected on this transthoracic study. Consider a transesophageal echocardiogram to exclude cardiac source of embolism if clinically indicated. FINDINGS  Left Ventricle: Left ventricular ejection fraction, by estimation, is 70 to 75%. The left ventricle has hyperdynamic function. The left ventricle has no regional wall motion abnormalities. The left ventricular internal cavity size was normal in size. There is mild left ventricular hypertrophy. Left ventricular diastolic parameters are indeterminate. Right Ventricle: The right ventricular size is mildly enlarged. No increase in right ventricular wall thickness. Right ventricular systolic function is normal. There is normal pulmonary artery systolic pressure. The tricuspid regurgitant velocity is 1.54  m/s, and with an assumed right atrial  pressure of 8 mmHg, the estimated right ventricular systolic pressure is 18.8 mmHg. Left Atrium: Left atrial size was severely dilated. Right Atrium: Right atrial size was moderately dilated. Pericardium: There is no evidence of pericardial effusion. Mitral Valve: The mitral valve is normal in structure. No evidence of mitral valve regurgitation. No evidence of mitral valve stenosis. Tricuspid Valve: The tricuspid valve is normal in structure. Tricuspid valve regurgitation is trivial. No evidence of tricuspid stenosis. Aortic Valve: The aortic valve is normal in structure. Aortic valve regurgitation is not visualized. No aortic stenosis is present. Pulmonic Valve: The pulmonic valve was normal in structure. Pulmonic valve regurgitation is trivial. No evidence of pulmonic stenosis. Aorta: Aortic dilatation noted. There is mild dilatation of the aortic root, measuring 42 mm. There is mild dilatation of the ascending aorta, measuring 41 mm. Venous: The inferior vena cava is dilated in size with greater than 50% respiratory variability, suggesting right atrial pressure of 8 mmHg. IAS/Shunts: No atrial level shunt detected by color flow Doppler.  LEFT VENTRICLE PLAX 2D LVIDd:         4.10 cm LVIDs:         2.40 cm LV PW:         1.30 cm LV IVS:        1.40 cm LVOT diam:     2.30 cm LV SV:         70 LV SV Index:   37 LVOT Area:     4.15 cm  LV Volumes (MOD) LV vol d, MOD A2C: 82.7 ml LV vol d, MOD A4C: 83.2 ml LV vol s, MOD  A2C: 32.9 ml LV vol s, MOD A4C: 35.0 ml LV SV MOD A2C:     49.8 ml LV SV MOD A4C:     83.2 ml LV SV MOD BP:      48.3 ml RIGHT VENTRICLE RV S prime:     7.93 cm/s TAPSE (M-mode): 1.9 cm LEFT ATRIUM              Index        RIGHT ATRIUM           Index LA diam:        4.50 cm  2.35 cm/m   RA Area:     28.60 cm LA Vol (A2C):   108.0 ml 56.49 ml/m  RA Volume:   91.00 ml  47.59 ml/m LA Vol (A4C):   121.0 ml 63.28 ml/m LA Biplane Vol: 120.0 ml 62.76 ml/m  AORTIC VALVE LVOT Vmax:   106.33 cm/s LVOT  Vmean:  64.500 cm/s LVOT VTI:    0.168 m  AORTA Ao Root diam: 4.20 cm Ao Asc diam:  4.10 cm TRICUSPID VALVE TR Peak grad:   9.5 mmHg TR Vmax:        154.00 cm/s  SHUNTS Systemic VTI:  0.17 m Systemic Diam: 2.30 cm Candee Furbish MD Electronically signed by Candee Furbish MD Signature Date/Time: 01/25/2022/12:52:11 PM    Final    CT ANGIO HEAD NECK W WO CM W PERF  Result Date: 01/25/2022 CLINICAL DATA:  Neuro deficit with acute stroke suspected. EXAM: CT ANGIOGRAPHY HEAD AND NECK CT PERFUSION BRAIN TECHNIQUE: Multidetector CT imaging of the head and neck was performed using the standard protocol during bolus administration of intravenous contrast. Multiplanar CT image reconstructions and MIPs were obtained to evaluate the vascular anatomy. Carotid stenosis measurements (when applicable) are obtained utilizing NASCET criteria, using the distal internal carotid diameter as the denominator. Multiphase CT imaging of the brain was performed following IV bolus contrast injection. Subsequent parametric perfusion maps were calculated using RAPID software. RADIATION DOSE REDUCTION: This exam was performed according to the departmental dose-optimization program which includes automated exposure control, adjustment of the mA and/or kV according to patient size and/or use of iterative reconstruction technique. CONTRAST:  126m OMNIPAQUE IOHEXOL 350 MG/ML SOLN COMPARISON:  Head CT from yesterday FINDINGS: CT HEAD FINDINGS Brain: Chronic small vessel ischemia with confluent biparietal white matter low-density and remote left occipital cortex infarct. Generalized brain atrophy with ventriculomegaly. Vascular: See below Skull: No acute finding Sinuses: Large retention cyst in the right maxillary sinus. Orbits: Negative Review of the MIP images confirms the above findings CTA NECK FINDINGS Aortic arch: Partial coverage is negative for acute finding or dilatation. Three vessel branching. Right carotid system: Atheromatous calcification at  the bifurcation. No stenosis or ulceration. ICA tortuosity. Left carotid system: Mild atheromatous plaque for age. ICA tortuosity. No stenosis or ulceration Vertebral arteries: No proximal subclavian stenosis. Atheromatous plaque at the right vertebral origin. The vertebral arteries are smoothly contoured and widely patent when allowing for tortuosity accentuated by vertebral spurring. Skeleton: Advanced cervical spine degeneration with reversal of cervical lordosis. Other neck: No acute finding Upper chest: No acute finding Review of the MIP images confirms the above findings CTA HEAD FINDINGS Anterior circulation: Atheromatous calcification affecting the carotid siphons. Mild atheromatous irregularity of branch vessels. No branch occlusion, beading, or flow limiting stenosis. Negative for aneurysm. Posterior circulation: Vertebral and basilar tortuosity without stenosis. Fetal type right PCA. No branch occlusion, beading, or aneurysm. Venous sinuses: Unremarkable for the  contrast phase Anatomic variants: As above CT Brain Perfusion Findings: Nondiagnostic due to motion artifact. No superimposed gross perfusion deficits. IMPRESSION: 1. No emergent finding. 2. Atherosclerosis without significant stenosis of major arteries in the head and neck. 3. Nondiagnostic CT perfusion due to motion. No evidence of superimposed large territory deficit. Electronically Signed   By: Jorje Guild M.D.   On: 01/25/2022 04:44   DG Chest Portable 1 View  Result Date: 01/24/2022 CLINICAL DATA:  Altered mental status.  Cough. EXAM: PORTABLE CHEST 1 VIEW COMPARISON:  10/22/2020 FINDINGS: 1310 hours. Low volume film. The cardio pericardial silhouette is enlarged. The lungs are clear without focal pneumonia, edema, pneumothorax or pleural effusion. Bones are diffusely demineralized. Telemetry leads overlie the chest. IMPRESSION: Low volume film without acute cardiopulmonary findings. Electronically Signed   By: Misty Stanley M.D.    On: 01/24/2022 13:25   CT HEAD CODE STROKE WO CONTRAST  Result Date: 01/24/2022 CLINICAL DATA:  Code stroke. Provided history: Neuro deficit, acute, stroke suspected. EXAM: CT HEAD WITHOUT CONTRAST TECHNIQUE: Contiguous axial images were obtained from the base of the skull through the vertex without intravenous contrast. RADIATION DOSE REDUCTION: This exam was performed according to the departmental dose-optimization program which includes automated exposure control, adjustment of the mA and/or kV according to patient size and/or use of iterative reconstruction technique. COMPARISON:  Prior head CT examinations 10/22/2020 and earlier. FINDINGS: Significantly motion degraded examination, limiting evaluation. Additionally, portions of the anteroinferior frontal lobes are excluded from the field of view (left greater than right). Brain: Moderate cerebral atrophy. Comparatively mild cerebellar atrophy. Moderate patchy and ill-defined hypoattenuation within the cerebral white matter, nonspecific but compatible with chronic small vessel disease. Known chronic cortical/subcortical left occipital lobe infarct (PCA vascular territory). Within the limitations of significant motion degradation, no definite intracranial hemorrhage or acute demarcated cortical infarct is identified. No appreciable intracranial mass or extra-axial fluid collection. No midline shift. Vascular: No hyperdense vessel. Atherosclerotic calcifications. Skull: No fracture or aggressive osseous lesion. Sinuses/Orbits: The orbits and the majority of the paranasal sinuses are excluded from the field of view. No appreciable significant paranasal sinus disease. ASPECTS ([place] Stroke Program Early CT Score) - Ganglionic level infarction (caudate, lentiform nuclei, internal capsule, insula, M1-M3 cortex): 7 - Supraganglionic infarction (M4-M6 cortex): 3 Total score (0-10 with 10 being normal): 10 Impressions #1 and #2 communicated to Dr. Leonie Man at 12:49  pmon 01/24/2022 by text page via the [facility] messaging system. IMPRESSION: 1. Examination significantly limited by motion degradation. Additionally, portions of the anteroinferior frontal lobes are excluded from the field of view (left greater than right). 2. Within this limitation, no definite acute intracranial abnormality is identified. 3. Known chronic left occipital lobe PCA territory infarct. 4. Moderate chronic small ischemic changes within the cerebral white matter. 5. Moderate cerebral atrophy. 6. Comparatively mild cerebellar atrophy. Electronically Signed   By: Kellie Simmering D.O.   On: 01/24/2022 12:52     Discharge Exam: Vitals:   01/30/22 0424 01/30/22 0821  BP: 134/83 (!) 157/89  Pulse: 70 82  Resp: 19 18  Temp: 97.6 F (36.4 C) 97.7 F (36.5 C)  SpO2: 98% 94%   Vitals:   01/30/22 0022 01/30/22 0226 01/30/22 0424 01/30/22 0821  BP: 135/88 (!) 157/70 134/83 (!) 157/89  Pulse: 73 75 70 82  Resp: '20 20 19 18  '$ Temp: 97.6 F (36.4 C)  97.6 F (36.4 C) 97.7 F (36.5 C)  TempSrc: Oral  Oral Oral  SpO2: 98% 96% 98% 94%  Weight:      Height:        General: Pt is alert, awake, not in acute distress Cardiovascular: RRR, S1/S2 +, no rubs, no gallops Respiratory: CTA bilaterally, no wheezing, no rhonchi Abdominal: Soft, NT, ND, bowel sounds + Extremities: no edema, no cyanosis    The results of significant diagnostics from this hospitalization (including imaging, microbiology, ancillary and laboratory) are listed below for reference.     Microbiology: Recent Results (from the past 240 hour(s))  Resp Panel by RT-PCR (Flu A&B, Covid) Anterior Nasal Swab     Status: Abnormal   Collection Time: 01/24/22 12:26 PM   Specimen: Anterior Nasal Swab  Result Value Ref Range Status   SARS Coronavirus 2 by RT PCR POSITIVE (A) NEGATIVE Final    Comment: (NOTE) SARS-CoV-2 target nucleic acids are DETECTED.  The SARS-CoV-2 RNA is generally detectable in upper respiratory specimens  during the acute phase of infection. Positive results are indicative of the presence of the identified virus, but do not rule out bacterial infection or co-infection with other pathogens not detected by the test. Clinical correlation with patient history and other diagnostic information is necessary to determine patient infection status. The expected result is Negative.  Fact Sheet for Patients: EntrepreneurPulse.com.au  Fact Sheet for Healthcare Providers: IncredibleEmployment.be  This test is not yet approved or cleared by the [place] FDA and  has been authorized for detection and/or diagnosis of SARS-CoV-2 by FDA under an Emergency Use Authorization (EUA).  This EUA will remain in effect (meaning this test can be used) for the duration of  the COVID-19 declaration under Section 564(b)(1) of the A ct, 21 U.S.C. section 360bbb-3(b)(1), unless the authorization is terminated or revoked sooner.     Influenza A by PCR NEGATIVE NEGATIVE Final   Influenza B by PCR NEGATIVE NEGATIVE Final    Comment: (NOTE) The Xpert Xpress SARS-CoV-2/FLU/RSV plus assay is intended as an aid in the diagnosis of influenza from Nasopharyngeal swab specimens and should not be used as a sole basis for treatment. Nasal washings and aspirates are unacceptable for Xpert Xpress SARS-CoV-2/FLU/RSV testing.  Fact Sheet for Patients: EntrepreneurPulse.com.au  Fact Sheet for Healthcare Providers: IncredibleEmployment.be  This test is not yet approved or cleared by the [place] FDA and has been authorized for detection and/or diagnosis of SARS-CoV-2 by FDA under an Emergency Use Authorization (EUA). This EUA will remain in effect (meaning this test can be used) for the duration of the COVID-19 declaration under Section 564(b)(1) of the Act, 21 U.S.C. section 360bbb-3(b)(1), unless the authorization is terminated  or revoked.  Performed at [facility], [place] [address] [place], [place] [address]   Blood culture (routine x 2)     Status: None   Collection Time: 01/24/22  2:33 PM   Specimen: BLOOD RIGHT HAND  Result Value Ref Range Status   Specimen Description BLOOD RIGHT HAND  Final   Special Requests   Final    BOTTLES DRAWN AEROBIC AND ANAEROBIC Blood Culture results may not be optimal due to an inadequate volume of blood received in culture bottles   Culture   Final    NO GROWTH 5 DAYS Performed at [facility], [place] [address], [place], [place] [address]    Report Status 01/29/2022 FINAL  Final  Blood culture (routine x 2)     Status: None   Collection Time: 01/25/22  2:38 AM   Specimen: BLOOD RIGHT FOREARM  Result Value Ref Range  Status   Specimen Description BLOOD RIGHT FOREARM  Final   Special Requests   Final    BOTTLES DRAWN AEROBIC ONLY Blood Culture adequate volume   Culture   Final    NO GROWTH 5 DAYS Performed at [facility], [address] [place], [place] [address]    Report Status 01/30/2022 FINAL  Final     Labs: BNP (last 3 results) No results for input(s): "BNP" in the last 8760 hours. Basic Metabolic Panel: Recent Labs  Lab 01/24/22 1440 01/25/22 0224 01/26/22 0057 01/27/22 1410 01/28/22 0633 01/29/22 0710 01/30/22 0219  NA 135  --  135 134*  --  138 138  K 4.3  --  3.8 3.5 3.7 3.5 3.2*  CL 102  --  105 105  --  110 113*  CO2 20*  --  21* 21*  --  20* 20*  GLUCOSE 106*  --  95 106*  --  96 98  BUN 20  --  19 14  --  12 11  CREATININE 1.79*  --  1.75* 1.51*  --  1.44* 1.44*  CALCIUM 8.9  --  7.9* 8.0*  --  8.0* 7.8*  MG  --    < > 1.9 1.7 1.9 1.8 1.8  PHOS  --   --   --   --   --  2.6  --    < > = values in this interval not displayed.   Liver Function Tests: Recent Labs  Lab 01/24/22 1440 01/29/22 0710  AST 28 36  ALT 12 15  ALKPHOS 132* 87  BILITOT 0.7 0.4  PROT 8.8* 7.0  ALBUMIN 2.6* 2.0*   No results for input(s):  "LIPASE", "AMYLASE" in the last 168 hours. Recent Labs  Lab 01/24/22 1440  AMMONIA 21   CBC: Recent Labs  Lab 01/24/22 1300  WBC 6.8  NEUTROABS 4.8  HGB 13.3  HCT 42.5  MCV 86.4  PLT 181   Cardiac Enzymes: No results for input(s): "CKTOTAL", "CKMB", "CKMBINDEX", "TROPONINI" in the last 168 hours. BNP: Invalid input(s): "POCBNP" CBG: Recent Labs  Lab 01/29/22 0635 01/29/22 1105 01/29/22 1623 01/29/22 2147 01/30/22 0831  GLUCAP 89 127* 157* 97 98   D-Dimer No results for input(s): "DDIMER" in the last 72 hours. Hgb A1c No results for input(s): "HGBA1C" in the last 72 hours. Lipid Profile No results for input(s): "CHOL", "HDL", "LDLCALC", "TRIG", "CHOLHDL", "LDLDIRECT" in the last 72 hours. Thyroid function studies No results for input(s): "TSH", "T4TOTAL", "T3FREE", "THYROIDAB" in the last 72 hours.  Invalid input(s): "FREET3" Anemia work up No results for input(s): "VITAMINB12", "FOLATE", "FERRITIN", "TIBC", "IRON", "RETICCTPCT" in the last 72 hours. Urinalysis    Component Value Date/Time   COLORURINE AMBER (A) 01/24/2022 1547   APPEARANCEUR CLOUDY (A) 01/24/2022 1547   LABSPEC 1.024 01/24/2022 1547   PHURINE 6.0 01/24/2022 1547   GLUCOSEU NEGATIVE 01/24/2022 1547   GLUCOSEU NEGATIVE 11/18/2021 1227   HGBUR SMALL (A) 01/24/2022 1547   BILIRUBINUR NEGATIVE 01/24/2022 1547   KETONESUR NEGATIVE 01/24/2022 1547   PROTEINUR >=300 (A) 01/24/2022 1547   UROBILINOGEN 0.2 11/18/2021 1227   NITRITE NEGATIVE 01/24/2022 1547   LEUKOCYTESUR TRACE (A) 01/24/2022 1547   Sepsis Labs Recent Labs  Lab 01/24/22 1300  WBC 6.8   Microbiology Recent Results (from the past 240 hour(s))  Resp Panel by RT-PCR (Flu A&B, Covid) Anterior Nasal Swab     Status: Abnormal   Collection Time: 01/24/22 12:26 PM  Specimen: Anterior Nasal Swab  Result Value Ref Range Status   SARS Coronavirus 2 by RT PCR POSITIVE (A) NEGATIVE Final    Comment: (NOTE) SARS-CoV-2 target nucleic  acids are DETECTED.  The SARS-CoV-2 RNA is generally detectable in upper respiratory specimens during the acute phase of infection. Positive results are indicative of the presence of the identified virus, but do not rule out bacterial infection or co-infection with other pathogens not detected by the test. Clinical correlation with patient history and other diagnostic information is necessary to determine patient infection status. The expected result is Negative.  Fact Sheet for Patients: EntrepreneurPulse.com.au  Fact Sheet for Healthcare Providers: IncredibleEmployment.be  This test is not yet approved or cleared by the [place] FDA and  has been authorized for detection and/or diagnosis of SARS-CoV-2 by FDA under an Emergency Use Authorization (EUA).  This EUA will remain in effect (meaning this test can be used) for the duration of  the COVID-19 declaration under Section 564(b)(1) of the A ct, 21 U.S.C. section 360bbb-3(b)(1), unless the authorization is terminated or revoked sooner.     Influenza A by PCR NEGATIVE NEGATIVE Final   Influenza B by PCR NEGATIVE NEGATIVE Final    Comment: (NOTE) The Xpert Xpress SARS-CoV-2/FLU/RSV plus assay is intended as an aid in the diagnosis of influenza from Nasopharyngeal swab specimens and should not be used as a sole basis for treatment. Nasal washings and aspirates are unacceptable for Xpert Xpress SARS-CoV-2/FLU/RSV testing.  Fact Sheet for Patients: EntrepreneurPulse.com.au  Fact Sheet for Healthcare Providers: IncredibleEmployment.be  This test is not yet approved or cleared by the [place] FDA and has been authorized for detection and/or diagnosis of SARS-CoV-2 by FDA under an Emergency Use Authorization (EUA). This EUA will remain in effect (meaning this test can be used) for the duration of the COVID-19 declaration under Section 564(b)(1) of  the Act, 21 U.S.C. section 360bbb-3(b)(1), unless the authorization is terminated or revoked.  Performed at [facility], [place] [address] [place], [place] [address]   Blood culture (routine x 2)     Status: None   Collection Time: 01/24/22  2:33 PM   Specimen: BLOOD RIGHT HAND  Result Value Ref Range Status   Specimen Description BLOOD RIGHT HAND  Final   Special Requests   Final    BOTTLES DRAWN AEROBIC AND ANAEROBIC Blood Culture results may not be optimal due to an inadequate volume of blood received in culture bottles   Culture   Final    NO GROWTH 5 DAYS Performed at [facility], [place] [address], [place], [place] [address]    Report Status 01/29/2022 FINAL  Final  Blood culture (routine x 2)     Status: None   Collection Time: 01/25/22  2:38 AM   Specimen: BLOOD RIGHT FOREARM  Result Value Ref Range Status   Specimen Description BLOOD RIGHT FOREARM  Final   Special Requests   Final    BOTTLES DRAWN AEROBIC ONLY Blood Culture adequate volume   Culture   Final    NO GROWTH 5 DAYS Performed at [facility], [place] [address], [place], [place] [address]    Report Status 01/30/2022 FINAL  Final     Time coordinating discharge: Over 30 minutes  SIGNED:   Darliss Cheney, MD  [facility] 01/30/2022, 10:54 AM *Please note that this is a verbal dictation therefore any spelling or grammatical errors are due to the "[place] One" system interpretation. If  7PM-7AM, please contact night-coverage [email or web address]

## 2022-01-31 DIAGNOSIS — N183 Chronic kidney disease, stage 3 unspecified: Secondary | ICD-10-CM | POA: Diagnosis not present

## 2022-01-31 DIAGNOSIS — E785 Hyperlipidemia, unspecified: Secondary | ICD-10-CM | POA: Diagnosis not present

## 2022-01-31 DIAGNOSIS — I4729 Other ventricular tachycardia: Secondary | ICD-10-CM | POA: Diagnosis not present

## 2022-01-31 DIAGNOSIS — A419 Sepsis, unspecified organism: Secondary | ICD-10-CM | POA: Diagnosis not present

## 2022-01-31 DIAGNOSIS — E1159 Type 2 diabetes mellitus with other circulatory complications: Secondary | ICD-10-CM | POA: Diagnosis not present

## 2022-01-31 DIAGNOSIS — I5032 Chronic diastolic (congestive) heart failure: Secondary | ICD-10-CM | POA: Diagnosis not present

## 2022-01-31 DIAGNOSIS — J449 Chronic obstructive pulmonary disease, unspecified: Secondary | ICD-10-CM | POA: Diagnosis not present

## 2022-01-31 DIAGNOSIS — G8929 Other chronic pain: Secondary | ICD-10-CM | POA: Diagnosis not present

## 2022-01-31 DIAGNOSIS — I872 Venous insufficiency (chronic) (peripheral): Secondary | ICD-10-CM | POA: Diagnosis not present

## 2022-01-31 DIAGNOSIS — I4891 Unspecified atrial fibrillation: Secondary | ICD-10-CM | POA: Diagnosis not present

## 2022-01-31 DIAGNOSIS — M549 Dorsalgia, unspecified: Secondary | ICD-10-CM | POA: Diagnosis not present

## 2022-01-31 DIAGNOSIS — C9000 Multiple myeloma not having achieved remission: Secondary | ICD-10-CM | POA: Diagnosis not present

## 2022-01-31 DIAGNOSIS — I679 Cerebrovascular disease, unspecified: Secondary | ICD-10-CM | POA: Diagnosis not present

## 2022-01-31 DIAGNOSIS — I152 Hypertension secondary to endocrine disorders: Secondary | ICD-10-CM | POA: Diagnosis not present

## 2022-02-03 ENCOUNTER — Ambulatory Visit: Payer: Medicare Other | Admitting: Physical Therapy

## 2022-02-04 DIAGNOSIS — I872 Venous insufficiency (chronic) (peripheral): Secondary | ICD-10-CM | POA: Diagnosis not present

## 2022-02-04 DIAGNOSIS — M549 Dorsalgia, unspecified: Secondary | ICD-10-CM | POA: Diagnosis not present

## 2022-02-04 DIAGNOSIS — I152 Hypertension secondary to endocrine disorders: Secondary | ICD-10-CM | POA: Diagnosis not present

## 2022-02-05 DIAGNOSIS — E1140 Type 2 diabetes mellitus with diabetic neuropathy, unspecified: Secondary | ICD-10-CM | POA: Diagnosis not present

## 2022-02-05 DIAGNOSIS — E785 Hyperlipidemia, unspecified: Secondary | ICD-10-CM | POA: Diagnosis not present

## 2022-02-05 DIAGNOSIS — I10 Essential (primary) hypertension: Secondary | ICD-10-CM | POA: Diagnosis not present

## 2022-02-06 ENCOUNTER — Ambulatory Visit: Payer: Medicare Other | Admitting: Physical Therapy

## 2022-02-11 DIAGNOSIS — R609 Edema, unspecified: Secondary | ICD-10-CM | POA: Diagnosis not present

## 2022-02-11 DIAGNOSIS — I10 Essential (primary) hypertension: Secondary | ICD-10-CM | POA: Diagnosis not present

## 2022-02-11 DIAGNOSIS — N1832 Chronic kidney disease, stage 3b: Secondary | ICD-10-CM | POA: Diagnosis not present

## 2022-02-14 DIAGNOSIS — E785 Hyperlipidemia, unspecified: Secondary | ICD-10-CM | POA: Diagnosis not present

## 2022-02-14 DIAGNOSIS — I872 Venous insufficiency (chronic) (peripheral): Secondary | ICD-10-CM | POA: Diagnosis not present

## 2022-02-14 DIAGNOSIS — I152 Hypertension secondary to endocrine disorders: Secondary | ICD-10-CM | POA: Diagnosis not present

## 2022-02-14 DIAGNOSIS — I4891 Unspecified atrial fibrillation: Secondary | ICD-10-CM | POA: Diagnosis not present

## 2022-02-14 DIAGNOSIS — I5032 Chronic diastolic (congestive) heart failure: Secondary | ICD-10-CM | POA: Diagnosis not present

## 2022-02-14 DIAGNOSIS — M549 Dorsalgia, unspecified: Secondary | ICD-10-CM | POA: Diagnosis not present

## 2022-02-14 DIAGNOSIS — I679 Cerebrovascular disease, unspecified: Secondary | ICD-10-CM | POA: Diagnosis not present

## 2022-02-14 DIAGNOSIS — I4729 Other ventricular tachycardia: Secondary | ICD-10-CM | POA: Diagnosis not present

## 2022-02-14 DIAGNOSIS — A419 Sepsis, unspecified organism: Secondary | ICD-10-CM | POA: Diagnosis not present

## 2022-02-14 DIAGNOSIS — E1159 Type 2 diabetes mellitus with other circulatory complications: Secondary | ICD-10-CM | POA: Diagnosis not present

## 2022-02-14 DIAGNOSIS — C9000 Multiple myeloma not having achieved remission: Secondary | ICD-10-CM | POA: Diagnosis not present

## 2022-02-14 DIAGNOSIS — N183 Chronic kidney disease, stage 3 unspecified: Secondary | ICD-10-CM | POA: Diagnosis not present

## 2022-02-17 DIAGNOSIS — N183 Chronic kidney disease, stage 3 unspecified: Secondary | ICD-10-CM | POA: Diagnosis not present

## 2022-02-17 DIAGNOSIS — M179 Osteoarthritis of knee, unspecified: Secondary | ICD-10-CM | POA: Diagnosis not present

## 2022-02-17 DIAGNOSIS — I480 Paroxysmal atrial fibrillation: Secondary | ICD-10-CM | POA: Diagnosis not present

## 2022-02-17 DIAGNOSIS — M1990 Unspecified osteoarthritis, unspecified site: Secondary | ICD-10-CM | POA: Diagnosis not present

## 2022-02-26 DIAGNOSIS — L97812 Non-pressure chronic ulcer of other part of right lower leg with fat layer exposed: Secondary | ICD-10-CM | POA: Diagnosis not present

## 2022-02-26 DIAGNOSIS — Z7901 Long term (current) use of anticoagulants: Secondary | ICD-10-CM | POA: Diagnosis not present

## 2022-02-26 DIAGNOSIS — E1122 Type 2 diabetes mellitus with diabetic chronic kidney disease: Secondary | ICD-10-CM | POA: Diagnosis not present

## 2022-02-26 DIAGNOSIS — J449 Chronic obstructive pulmonary disease, unspecified: Secondary | ICD-10-CM | POA: Diagnosis not present

## 2022-02-26 DIAGNOSIS — C9000 Multiple myeloma not having achieved remission: Secondary | ICD-10-CM | POA: Diagnosis not present

## 2022-02-26 DIAGNOSIS — I130 Hypertensive heart and chronic kidney disease with heart failure and stage 1 through stage 4 chronic kidney disease, or unspecified chronic kidney disease: Secondary | ICD-10-CM | POA: Diagnosis not present

## 2022-02-26 DIAGNOSIS — G8929 Other chronic pain: Secondary | ICD-10-CM | POA: Diagnosis not present

## 2022-02-26 DIAGNOSIS — Z87440 Personal history of urinary (tract) infections: Secondary | ICD-10-CM | POA: Diagnosis not present

## 2022-02-26 DIAGNOSIS — I4729 Other ventricular tachycardia: Secondary | ICD-10-CM | POA: Diagnosis not present

## 2022-02-26 DIAGNOSIS — M160 Bilateral primary osteoarthritis of hip: Secondary | ICD-10-CM | POA: Diagnosis not present

## 2022-02-26 DIAGNOSIS — I69322 Dysarthria following cerebral infarction: Secondary | ICD-10-CM | POA: Diagnosis not present

## 2022-02-26 DIAGNOSIS — R634 Abnormal weight loss: Secondary | ICD-10-CM | POA: Diagnosis not present

## 2022-02-26 DIAGNOSIS — I872 Venous insufficiency (chronic) (peripheral): Secondary | ICD-10-CM | POA: Diagnosis not present

## 2022-02-26 DIAGNOSIS — I480 Paroxysmal atrial fibrillation: Secondary | ICD-10-CM | POA: Diagnosis not present

## 2022-02-26 DIAGNOSIS — Z8616 Personal history of COVID-19: Secondary | ICD-10-CM | POA: Diagnosis not present

## 2022-02-26 DIAGNOSIS — E785 Hyperlipidemia, unspecified: Secondary | ICD-10-CM | POA: Diagnosis not present

## 2022-02-26 DIAGNOSIS — N1832 Chronic kidney disease, stage 3b: Secondary | ICD-10-CM | POA: Diagnosis not present

## 2022-02-26 DIAGNOSIS — E1140 Type 2 diabetes mellitus with diabetic neuropathy, unspecified: Secondary | ICD-10-CM | POA: Diagnosis not present

## 2022-02-26 DIAGNOSIS — I5032 Chronic diastolic (congestive) heart failure: Secondary | ICD-10-CM | POA: Diagnosis not present

## 2022-02-26 DIAGNOSIS — E1151 Type 2 diabetes mellitus with diabetic peripheral angiopathy without gangrene: Secondary | ICD-10-CM | POA: Diagnosis not present

## 2022-02-26 DIAGNOSIS — I69351 Hemiplegia and hemiparesis following cerebral infarction affecting right dominant side: Secondary | ICD-10-CM | POA: Diagnosis not present

## 2022-02-26 DIAGNOSIS — M47812 Spondylosis without myelopathy or radiculopathy, cervical region: Secondary | ICD-10-CM | POA: Diagnosis not present

## 2022-02-26 DIAGNOSIS — K5730 Diverticulosis of large intestine without perforation or abscess without bleeding: Secondary | ICD-10-CM | POA: Diagnosis not present

## 2022-02-27 ENCOUNTER — Telehealth: Payer: Self-pay | Admitting: Internal Medicine

## 2022-02-27 DIAGNOSIS — I872 Venous insufficiency (chronic) (peripheral): Secondary | ICD-10-CM | POA: Diagnosis not present

## 2022-02-27 DIAGNOSIS — E1140 Type 2 diabetes mellitus with diabetic neuropathy, unspecified: Secondary | ICD-10-CM | POA: Diagnosis not present

## 2022-02-27 DIAGNOSIS — E785 Hyperlipidemia, unspecified: Secondary | ICD-10-CM | POA: Diagnosis not present

## 2022-02-27 DIAGNOSIS — I4729 Other ventricular tachycardia: Secondary | ICD-10-CM | POA: Diagnosis not present

## 2022-02-27 DIAGNOSIS — R634 Abnormal weight loss: Secondary | ICD-10-CM | POA: Diagnosis not present

## 2022-02-27 DIAGNOSIS — C9000 Multiple myeloma not having achieved remission: Secondary | ICD-10-CM | POA: Diagnosis not present

## 2022-02-27 DIAGNOSIS — E1122 Type 2 diabetes mellitus with diabetic chronic kidney disease: Secondary | ICD-10-CM | POA: Diagnosis not present

## 2022-02-27 DIAGNOSIS — N1832 Chronic kidney disease, stage 3b: Secondary | ICD-10-CM | POA: Diagnosis not present

## 2022-02-27 DIAGNOSIS — I69322 Dysarthria following cerebral infarction: Secondary | ICD-10-CM | POA: Diagnosis not present

## 2022-02-27 DIAGNOSIS — M160 Bilateral primary osteoarthritis of hip: Secondary | ICD-10-CM | POA: Diagnosis not present

## 2022-02-27 DIAGNOSIS — Z8616 Personal history of COVID-19: Secondary | ICD-10-CM | POA: Diagnosis not present

## 2022-02-27 DIAGNOSIS — K5730 Diverticulosis of large intestine without perforation or abscess without bleeding: Secondary | ICD-10-CM | POA: Diagnosis not present

## 2022-02-27 DIAGNOSIS — I5032 Chronic diastolic (congestive) heart failure: Secondary | ICD-10-CM | POA: Diagnosis not present

## 2022-02-27 DIAGNOSIS — Z87440 Personal history of urinary (tract) infections: Secondary | ICD-10-CM | POA: Diagnosis not present

## 2022-02-27 DIAGNOSIS — Z7901 Long term (current) use of anticoagulants: Secondary | ICD-10-CM | POA: Diagnosis not present

## 2022-02-27 DIAGNOSIS — L97812 Non-pressure chronic ulcer of other part of right lower leg with fat layer exposed: Secondary | ICD-10-CM | POA: Diagnosis not present

## 2022-02-27 DIAGNOSIS — M47812 Spondylosis without myelopathy or radiculopathy, cervical region: Secondary | ICD-10-CM | POA: Diagnosis not present

## 2022-02-27 DIAGNOSIS — J449 Chronic obstructive pulmonary disease, unspecified: Secondary | ICD-10-CM | POA: Diagnosis not present

## 2022-02-27 DIAGNOSIS — G8929 Other chronic pain: Secondary | ICD-10-CM | POA: Diagnosis not present

## 2022-02-27 DIAGNOSIS — I130 Hypertensive heart and chronic kidney disease with heart failure and stage 1 through stage 4 chronic kidney disease, or unspecified chronic kidney disease: Secondary | ICD-10-CM | POA: Diagnosis not present

## 2022-02-27 DIAGNOSIS — E1151 Type 2 diabetes mellitus with diabetic peripheral angiopathy without gangrene: Secondary | ICD-10-CM | POA: Diagnosis not present

## 2022-02-27 DIAGNOSIS — I69351 Hemiplegia and hemiparesis following cerebral infarction affecting right dominant side: Secondary | ICD-10-CM | POA: Diagnosis not present

## 2022-02-27 DIAGNOSIS — I480 Paroxysmal atrial fibrillation: Secondary | ICD-10-CM | POA: Diagnosis not present

## 2022-02-27 NOTE — Telephone Encounter (Signed)
Verbal orders needed for occupational therapy - 1 x week x 6 weeks

## 2022-02-27 NOTE — Telephone Encounter (Signed)
Called OT no answer LMOM w/MD response.Marland KitchenJohny Chess

## 2022-02-27 NOTE — Telephone Encounter (Signed)
Leah from [facility] is requesting verbal orders.  -Home Health Physical Therapy: 1 time a week for 8 weeks.   -Home Health Aid: 1 time a week for 8 weeks.  She is also requesting:  -Copywriter, advertising  -Occupational therapy evaluation  -Nursing Evaluation  CB: [phone] She it is ok to leave a message, her vm is password protected through her job.   Please advise

## 2022-02-27 NOTE — Telephone Encounter (Signed)
Ok for verbal 

## 2022-02-28 ENCOUNTER — Ambulatory Visit: Payer: Medicare Other | Admitting: Internal Medicine

## 2022-03-02 DIAGNOSIS — Z7901 Long term (current) use of anticoagulants: Secondary | ICD-10-CM | POA: Diagnosis not present

## 2022-03-02 DIAGNOSIS — I5032 Chronic diastolic (congestive) heart failure: Secondary | ICD-10-CM | POA: Diagnosis not present

## 2022-03-02 DIAGNOSIS — C9000 Multiple myeloma not having achieved remission: Secondary | ICD-10-CM | POA: Diagnosis not present

## 2022-03-02 DIAGNOSIS — N1832 Chronic kidney disease, stage 3b: Secondary | ICD-10-CM | POA: Diagnosis not present

## 2022-03-02 DIAGNOSIS — M47812 Spondylosis without myelopathy or radiculopathy, cervical region: Secondary | ICD-10-CM | POA: Diagnosis not present

## 2022-03-02 DIAGNOSIS — J449 Chronic obstructive pulmonary disease, unspecified: Secondary | ICD-10-CM | POA: Diagnosis not present

## 2022-03-02 DIAGNOSIS — R634 Abnormal weight loss: Secondary | ICD-10-CM | POA: Diagnosis not present

## 2022-03-02 DIAGNOSIS — K5730 Diverticulosis of large intestine without perforation or abscess without bleeding: Secondary | ICD-10-CM | POA: Diagnosis not present

## 2022-03-02 DIAGNOSIS — E785 Hyperlipidemia, unspecified: Secondary | ICD-10-CM | POA: Diagnosis not present

## 2022-03-02 DIAGNOSIS — I69351 Hemiplegia and hemiparesis following cerebral infarction affecting right dominant side: Secondary | ICD-10-CM | POA: Diagnosis not present

## 2022-03-02 DIAGNOSIS — E1122 Type 2 diabetes mellitus with diabetic chronic kidney disease: Secondary | ICD-10-CM | POA: Diagnosis not present

## 2022-03-02 DIAGNOSIS — G8929 Other chronic pain: Secondary | ICD-10-CM | POA: Diagnosis not present

## 2022-03-02 DIAGNOSIS — L97812 Non-pressure chronic ulcer of other part of right lower leg with fat layer exposed: Secondary | ICD-10-CM | POA: Diagnosis not present

## 2022-03-02 DIAGNOSIS — M160 Bilateral primary osteoarthritis of hip: Secondary | ICD-10-CM | POA: Diagnosis not present

## 2022-03-02 DIAGNOSIS — E1151 Type 2 diabetes mellitus with diabetic peripheral angiopathy without gangrene: Secondary | ICD-10-CM | POA: Diagnosis not present

## 2022-03-02 DIAGNOSIS — I69322 Dysarthria following cerebral infarction: Secondary | ICD-10-CM | POA: Diagnosis not present

## 2022-03-02 DIAGNOSIS — Z8616 Personal history of COVID-19: Secondary | ICD-10-CM | POA: Diagnosis not present

## 2022-03-02 DIAGNOSIS — Z87440 Personal history of urinary (tract) infections: Secondary | ICD-10-CM | POA: Diagnosis not present

## 2022-03-02 DIAGNOSIS — E1140 Type 2 diabetes mellitus with diabetic neuropathy, unspecified: Secondary | ICD-10-CM | POA: Diagnosis not present

## 2022-03-02 DIAGNOSIS — I872 Venous insufficiency (chronic) (peripheral): Secondary | ICD-10-CM | POA: Diagnosis not present

## 2022-03-02 DIAGNOSIS — I480 Paroxysmal atrial fibrillation: Secondary | ICD-10-CM | POA: Diagnosis not present

## 2022-03-02 DIAGNOSIS — I130 Hypertensive heart and chronic kidney disease with heart failure and stage 1 through stage 4 chronic kidney disease, or unspecified chronic kidney disease: Secondary | ICD-10-CM | POA: Diagnosis not present

## 2022-03-02 DIAGNOSIS — I4729 Other ventricular tachycardia: Secondary | ICD-10-CM | POA: Diagnosis not present

## 2022-03-03 DIAGNOSIS — N1832 Chronic kidney disease, stage 3b: Secondary | ICD-10-CM | POA: Diagnosis not present

## 2022-03-03 DIAGNOSIS — I69351 Hemiplegia and hemiparesis following cerebral infarction affecting right dominant side: Secondary | ICD-10-CM | POA: Diagnosis not present

## 2022-03-03 DIAGNOSIS — R634 Abnormal weight loss: Secondary | ICD-10-CM | POA: Diagnosis not present

## 2022-03-03 DIAGNOSIS — Z7901 Long term (current) use of anticoagulants: Secondary | ICD-10-CM | POA: Diagnosis not present

## 2022-03-03 DIAGNOSIS — I872 Venous insufficiency (chronic) (peripheral): Secondary | ICD-10-CM | POA: Diagnosis not present

## 2022-03-03 DIAGNOSIS — M179 Osteoarthritis of knee, unspecified: Secondary | ICD-10-CM | POA: Diagnosis not present

## 2022-03-03 DIAGNOSIS — M160 Bilateral primary osteoarthritis of hip: Secondary | ICD-10-CM | POA: Diagnosis not present

## 2022-03-03 DIAGNOSIS — G8929 Other chronic pain: Secondary | ICD-10-CM | POA: Diagnosis not present

## 2022-03-03 DIAGNOSIS — C9000 Multiple myeloma not having achieved remission: Secondary | ICD-10-CM | POA: Diagnosis not present

## 2022-03-03 DIAGNOSIS — Z8616 Personal history of COVID-19: Secondary | ICD-10-CM | POA: Diagnosis not present

## 2022-03-03 DIAGNOSIS — I4729 Other ventricular tachycardia: Secondary | ICD-10-CM | POA: Diagnosis not present

## 2022-03-03 DIAGNOSIS — Z87440 Personal history of urinary (tract) infections: Secondary | ICD-10-CM | POA: Diagnosis not present

## 2022-03-03 DIAGNOSIS — L97812 Non-pressure chronic ulcer of other part of right lower leg with fat layer exposed: Secondary | ICD-10-CM | POA: Diagnosis not present

## 2022-03-03 DIAGNOSIS — E1122 Type 2 diabetes mellitus with diabetic chronic kidney disease: Secondary | ICD-10-CM | POA: Diagnosis not present

## 2022-03-03 DIAGNOSIS — I5032 Chronic diastolic (congestive) heart failure: Secondary | ICD-10-CM | POA: Diagnosis not present

## 2022-03-03 DIAGNOSIS — E1140 Type 2 diabetes mellitus with diabetic neuropathy, unspecified: Secondary | ICD-10-CM | POA: Diagnosis not present

## 2022-03-03 DIAGNOSIS — M47812 Spondylosis without myelopathy or radiculopathy, cervical region: Secondary | ICD-10-CM | POA: Diagnosis not present

## 2022-03-03 DIAGNOSIS — E785 Hyperlipidemia, unspecified: Secondary | ICD-10-CM | POA: Diagnosis not present

## 2022-03-03 DIAGNOSIS — I69322 Dysarthria following cerebral infarction: Secondary | ICD-10-CM | POA: Diagnosis not present

## 2022-03-03 DIAGNOSIS — K5730 Diverticulosis of large intestine without perforation or abscess without bleeding: Secondary | ICD-10-CM | POA: Diagnosis not present

## 2022-03-03 DIAGNOSIS — N183 Chronic kidney disease, stage 3 unspecified: Secondary | ICD-10-CM | POA: Diagnosis not present

## 2022-03-03 DIAGNOSIS — J449 Chronic obstructive pulmonary disease, unspecified: Secondary | ICD-10-CM | POA: Diagnosis not present

## 2022-03-03 DIAGNOSIS — M1990 Unspecified osteoarthritis, unspecified site: Secondary | ICD-10-CM | POA: Diagnosis not present

## 2022-03-03 DIAGNOSIS — E1151 Type 2 diabetes mellitus with diabetic peripheral angiopathy without gangrene: Secondary | ICD-10-CM | POA: Diagnosis not present

## 2022-03-03 DIAGNOSIS — I480 Paroxysmal atrial fibrillation: Secondary | ICD-10-CM | POA: Diagnosis not present

## 2022-03-03 DIAGNOSIS — I130 Hypertensive heart and chronic kidney disease with heart failure and stage 1 through stage 4 chronic kidney disease, or unspecified chronic kidney disease: Secondary | ICD-10-CM | POA: Diagnosis not present

## 2022-03-03 NOTE — Telephone Encounter (Signed)
Called Leah no answer LMOM w/MD verbals.Marland KitchenJohny Chess

## 2022-03-03 NOTE — Telephone Encounter (Signed)
Ok for verbals 

## 2022-03-05 DIAGNOSIS — L97812 Non-pressure chronic ulcer of other part of right lower leg with fat layer exposed: Secondary | ICD-10-CM | POA: Diagnosis not present

## 2022-03-05 DIAGNOSIS — J449 Chronic obstructive pulmonary disease, unspecified: Secondary | ICD-10-CM | POA: Diagnosis not present

## 2022-03-05 DIAGNOSIS — E1151 Type 2 diabetes mellitus with diabetic peripheral angiopathy without gangrene: Secondary | ICD-10-CM | POA: Diagnosis not present

## 2022-03-05 DIAGNOSIS — E1122 Type 2 diabetes mellitus with diabetic chronic kidney disease: Secondary | ICD-10-CM | POA: Diagnosis not present

## 2022-03-05 DIAGNOSIS — M160 Bilateral primary osteoarthritis of hip: Secondary | ICD-10-CM | POA: Diagnosis not present

## 2022-03-05 DIAGNOSIS — G8929 Other chronic pain: Secondary | ICD-10-CM | POA: Diagnosis not present

## 2022-03-05 DIAGNOSIS — I69322 Dysarthria following cerebral infarction: Secondary | ICD-10-CM | POA: Diagnosis not present

## 2022-03-05 DIAGNOSIS — E1140 Type 2 diabetes mellitus with diabetic neuropathy, unspecified: Secondary | ICD-10-CM | POA: Diagnosis not present

## 2022-03-05 DIAGNOSIS — Z7901 Long term (current) use of anticoagulants: Secondary | ICD-10-CM | POA: Diagnosis not present

## 2022-03-05 DIAGNOSIS — C9000 Multiple myeloma not having achieved remission: Secondary | ICD-10-CM | POA: Diagnosis not present

## 2022-03-05 DIAGNOSIS — I480 Paroxysmal atrial fibrillation: Secondary | ICD-10-CM | POA: Diagnosis not present

## 2022-03-05 DIAGNOSIS — E785 Hyperlipidemia, unspecified: Secondary | ICD-10-CM | POA: Diagnosis not present

## 2022-03-05 DIAGNOSIS — R634 Abnormal weight loss: Secondary | ICD-10-CM | POA: Diagnosis not present

## 2022-03-05 DIAGNOSIS — I4729 Other ventricular tachycardia: Secondary | ICD-10-CM | POA: Diagnosis not present

## 2022-03-05 DIAGNOSIS — I5032 Chronic diastolic (congestive) heart failure: Secondary | ICD-10-CM | POA: Diagnosis not present

## 2022-03-05 DIAGNOSIS — K5730 Diverticulosis of large intestine without perforation or abscess without bleeding: Secondary | ICD-10-CM | POA: Diagnosis not present

## 2022-03-05 DIAGNOSIS — M47812 Spondylosis without myelopathy or radiculopathy, cervical region: Secondary | ICD-10-CM | POA: Diagnosis not present

## 2022-03-05 DIAGNOSIS — N1832 Chronic kidney disease, stage 3b: Secondary | ICD-10-CM | POA: Diagnosis not present

## 2022-03-05 DIAGNOSIS — Z87440 Personal history of urinary (tract) infections: Secondary | ICD-10-CM | POA: Diagnosis not present

## 2022-03-05 DIAGNOSIS — Z8616 Personal history of COVID-19: Secondary | ICD-10-CM | POA: Diagnosis not present

## 2022-03-05 DIAGNOSIS — I130 Hypertensive heart and chronic kidney disease with heart failure and stage 1 through stage 4 chronic kidney disease, or unspecified chronic kidney disease: Secondary | ICD-10-CM | POA: Diagnosis not present

## 2022-03-05 DIAGNOSIS — I872 Venous insufficiency (chronic) (peripheral): Secondary | ICD-10-CM | POA: Diagnosis not present

## 2022-03-05 DIAGNOSIS — I69351 Hemiplegia and hemiparesis following cerebral infarction affecting right dominant side: Secondary | ICD-10-CM | POA: Diagnosis not present

## 2022-03-06 ENCOUNTER — Ambulatory Visit: Payer: Medicare Other | Admitting: Internal Medicine

## 2022-03-06 ENCOUNTER — Ambulatory Visit: Payer: Self-pay | Admitting: Internal Medicine

## 2022-03-09 ENCOUNTER — Other Ambulatory Visit: Payer: Self-pay | Admitting: Internal Medicine

## 2022-03-09 NOTE — Telephone Encounter (Signed)
Please refill as per office routine med refill policy (all routine meds to be refilled for 3 mo or monthly (per pt preference) up to one year from last visit, then month to month grace period for 3 mo, then further med refills will have to be denied) ? ?

## 2022-03-10 ENCOUNTER — Other Ambulatory Visit: Payer: Self-pay

## 2022-03-10 ENCOUNTER — Ambulatory Visit (INDEPENDENT_AMBULATORY_CARE_PROVIDER_SITE_OTHER): Payer: Medicare Other | Admitting: Internal Medicine

## 2022-03-10 VITALS — BP 136/74 | HR 51 | Temp 98.4°F | Ht 74.0 in | Wt 261.0 lb

## 2022-03-10 DIAGNOSIS — E559 Vitamin D deficiency, unspecified: Secondary | ICD-10-CM | POA: Diagnosis not present

## 2022-03-10 DIAGNOSIS — Z87440 Personal history of urinary (tract) infections: Secondary | ICD-10-CM | POA: Diagnosis not present

## 2022-03-10 DIAGNOSIS — I10 Essential (primary) hypertension: Secondary | ICD-10-CM | POA: Diagnosis not present

## 2022-03-10 DIAGNOSIS — I872 Venous insufficiency (chronic) (peripheral): Secondary | ICD-10-CM | POA: Diagnosis not present

## 2022-03-10 DIAGNOSIS — I69322 Dysarthria following cerebral infarction: Secondary | ICD-10-CM | POA: Diagnosis not present

## 2022-03-10 DIAGNOSIS — I69351 Hemiplegia and hemiparesis following cerebral infarction affecting right dominant side: Secondary | ICD-10-CM | POA: Diagnosis not present

## 2022-03-10 DIAGNOSIS — C9000 Multiple myeloma not having achieved remission: Secondary | ICD-10-CM | POA: Diagnosis not present

## 2022-03-10 DIAGNOSIS — E785 Hyperlipidemia, unspecified: Secondary | ICD-10-CM

## 2022-03-10 DIAGNOSIS — E1122 Type 2 diabetes mellitus with diabetic chronic kidney disease: Secondary | ICD-10-CM | POA: Diagnosis not present

## 2022-03-10 DIAGNOSIS — I480 Paroxysmal atrial fibrillation: Secondary | ICD-10-CM | POA: Diagnosis not present

## 2022-03-10 DIAGNOSIS — I4729 Other ventricular tachycardia: Secondary | ICD-10-CM | POA: Diagnosis not present

## 2022-03-10 DIAGNOSIS — Z8616 Personal history of COVID-19: Secondary | ICD-10-CM | POA: Diagnosis not present

## 2022-03-10 DIAGNOSIS — K5730 Diverticulosis of large intestine without perforation or abscess without bleeding: Secondary | ICD-10-CM | POA: Diagnosis not present

## 2022-03-10 DIAGNOSIS — E1151 Type 2 diabetes mellitus with diabetic peripheral angiopathy without gangrene: Secondary | ICD-10-CM | POA: Diagnosis not present

## 2022-03-10 DIAGNOSIS — I130 Hypertensive heart and chronic kidney disease with heart failure and stage 1 through stage 4 chronic kidney disease, or unspecified chronic kidney disease: Secondary | ICD-10-CM | POA: Diagnosis not present

## 2022-03-10 DIAGNOSIS — E1140 Type 2 diabetes mellitus with diabetic neuropathy, unspecified: Secondary | ICD-10-CM

## 2022-03-10 DIAGNOSIS — M160 Bilateral primary osteoarthritis of hip: Secondary | ICD-10-CM | POA: Diagnosis not present

## 2022-03-10 DIAGNOSIS — R634 Abnormal weight loss: Secondary | ICD-10-CM | POA: Diagnosis not present

## 2022-03-10 DIAGNOSIS — I5032 Chronic diastolic (congestive) heart failure: Secondary | ICD-10-CM | POA: Diagnosis not present

## 2022-03-10 DIAGNOSIS — G8929 Other chronic pain: Secondary | ICD-10-CM | POA: Diagnosis not present

## 2022-03-10 DIAGNOSIS — Z7901 Long term (current) use of anticoagulants: Secondary | ICD-10-CM | POA: Diagnosis not present

## 2022-03-10 DIAGNOSIS — N1832 Chronic kidney disease, stage 3b: Secondary | ICD-10-CM | POA: Diagnosis not present

## 2022-03-10 DIAGNOSIS — M47812 Spondylosis without myelopathy or radiculopathy, cervical region: Secondary | ICD-10-CM | POA: Diagnosis not present

## 2022-03-10 DIAGNOSIS — L97812 Non-pressure chronic ulcer of other part of right lower leg with fat layer exposed: Secondary | ICD-10-CM | POA: Diagnosis not present

## 2022-03-10 DIAGNOSIS — J449 Chronic obstructive pulmonary disease, unspecified: Secondary | ICD-10-CM | POA: Diagnosis not present

## 2022-03-10 MED ORDER — CARVEDILOL 3.125 MG PO TABS: 3.1250 mg | ORAL_TABLET | Freq: Two times a day (BID) | ORAL | Status: DC

## 2022-03-10 MED ORDER — APIXABAN 5 MG PO TABS
5.0000 mg | ORAL_TABLET | Freq: Two times a day (BID) | ORAL | 3 refills | Status: DC
Start: 2022-03-10 — End: 2022-04-18

## 2022-03-10 MED ORDER — ROSUVASTATIN CALCIUM 20 MG PO TABS: 20.0000 mg | ORAL_TABLET | Freq: Every day | ORAL | 3 refills | Status: DC

## 2022-03-10 MED ORDER — DILTIAZEM HCL ER 120 MG PO TB24: 120.0000 mg | ORAL_TABLET | Freq: Every day | ORAL | 3 refills | Status: DC

## 2022-03-10 MED ORDER — POTASSIUM CHLORIDE ER 10 MEQ PO TBCR: 10.0000 meq | EXTENDED_RELEASE_TABLET | Freq: Every day | ORAL | 3 refills | Status: DC

## 2022-03-10 MED ORDER — FUROSEMIDE 40 MG PO TABS: ORAL_TABLET | ORAL | 1 refills | Status: DC

## 2022-03-10 NOTE — Patient Instructions (Signed)

## 2022-03-10 NOTE — Progress Notes (Unsigned)
**Note Parker-Identified via Obfuscation** Patient ID: [name], male   DOB: 1946-06-23, 76 y.o.   MRN: [id number]        Chief Complaint: follow up post hospn July 7 - 13       HPI:  [name] is a 76 y.o. male here with daughter, with recent fall with urinary incontinence, dysarthric and right weakness - code stroke called, neurology consulted who noted symptoms cleared, CT head neg,  Echo shows 70 to 75% ejection fraction with no wall motion normality and no PFO..  PT OT evaluated patient and recommends SNF.  SLP on board as well.  Continue Eliquis.  MRI was ordered and 4 attempts were made to complete the MRI but reportedly patient had different issues each time which led to unsuccessful MRI completion, the issues included coughing spells or inability to lay flat due to kyphosis.  This happened despite of giving him Ativan as well as cough medications.  Neurology repeated CT head after few days which was unremarkable.  They cleared him for discharge.  Patient has no focal deficit.  He is already on anticoagulation.  Sepsis secondary to UTI, POA: Patient meets criteria for sepsis due to fever of 101.2 as well as tachypnea and UTI.  Patient has remained afebrile  Completed 5 days of Rocephin.Asymptomatic and nonsustained intermittent V. tach: Per reports from nurses, patient has had 2-3 episodes of nonsustained V. tach in the last 2 to 3 days and during all those episodes, patient has remained asymptomatic. discharging this patient on Cardizem CD 120 mg p.o. daily.  PT OT, he was noted to require max assist with 2.  They highly recommended that he goes to SNF.Finally family and patient has agreed and he is going to be discharged to SNF .  Today at home, Pt denies chest pain, increased sob or doe, wheezing, orthopnea, PND, increased LE swelling, palpitations, dizziness or syncope.   Pt denies polydipsia, polyuria, or new focal neuro s/s. Denies urinary symptoms such as dysuria, frequency, urgency, flank pain, hematuria or n/v,  fever, chills.  Not taking Vit D       Wt Readings from Last 3 Encounters:  03/10/22 261 lb (118.4 kg)  01/24/22 147 lb 14.9 oz (67.1 kg)  11/18/21 274 lb (124.3 kg)   BP Readings from Last 3 Encounters:  03/10/22 136/74  01/30/22 (!) 148/92  11/18/21 (!) 148/82         Past Medical History:  Diagnosis Date   Adenoma 05/2008   Alcoholism in recovery [facility])    BPH (benign prostatic hyperplasia)    Cancer (HCC)    Prostate   Chronic LBP    Hip & Back -- Sees Dr. Maia Petties @ [facility]   Colon polyps    Complex renal cyst 12/2010   Controlled type 2 diabetes mellitus with neuropathy ([place]) 01/2007   COPD (chronic obstructive pulmonary disease) ([place])    Diabetes ([place])    Diverticulitis of colon    ED (erectile dysfunction)    s/p Penile prosthesis (09/2010)   History of prostate cancer    Dr. Karsten Ro   History of sick sinus syndrome    reduced BB dose for Bradycardia   HLD (hyperlipidemia)    Hypertension    Impaired glucose tolerance 12/21/2013   Nephrolithiasis    Osteoarthritis of both hips    Osteoarthritis of lumbosacral spine    with Disc Disease   PAF (paroxysmal atrial fibrillation) (HCC)    No longer on Amiodarone.  Not on  Anticoaguation b/c no recurrence.   Paresthesias/numbness    Bilateral LE   Peripheral neuropathy 12/21/2013   Past Surgical History:  Procedure Laterality Date   LEFT HEART CATH AND CORONARY ANGIOGRAPHY  2003   Normal Coronary Arteries.   NM MYOVIEW LTD  02/21/2020   EF 60%.  Medium sized mild severity defect in the basal inferior, mid inferior and apical inferior location-suggesting of ischemia.  Read as low-intermediate risk.  (On cardiology review this is felt to be a fixed defect either related to prior infarct versus diaphragmatic attenuation.  Felt to be low risk)   PENILE PROSTHESIS IMPLANT  06/21/2012   Procedure: PENILE PROTHESIS INFLATABLE;  Surgeon: Claybon Jabs, MD;  Location: [facility];  Service: Urology;   Laterality: N/A;  REMOVAL AND REPLACEMENT OF SOME  OF PROSTHESIS ([facility])    PENILE PROSTHESIS PLACEMENT  09/2010   PROSTATECTOMY     REMOVAL OF PENILE PROSTHESIS N/A 02/15/2018   Procedure: REMOVAL OF PENILE PROSTHESIS;  Surgeon: Kathie Rhodes, MD;  Location: [facility];  Service: Urology;  Laterality: N/A;   TRANSTHORACIC ECHOCARDIOGRAM  04/2016   a) Relatively normal EF of 55-60%. No RWMA suggesting no prior MI. Gr 2 DD (pseudo-normal filling pattern) along with moderately dilated left atrium.;; b) 02/2018: Mod LVH. EF 65-70%. No RWMA.  Unable to assess DF 2/2 Afib. Mild MR. Severe BiAtrial Enlargement. High CVP.   TRANSTHORACIC ECHOCARDIOGRAM  02/17/2020   EF 60 to 65%.  No R WMA.  Unable to assess diastolic parameters because of A. fib.  Mildly elevated PA pressures.  Mild LA dilation.  Mild aortic valve sclerosis but no stenosis.  Normal IVC.    reports that he quit smoking about 39 years ago. His smoking use included cigarettes. He has never used smokeless tobacco. He reports current alcohol use. He reports that he does not use drugs. family history includes Coronary artery disease in his father and mother; Diabetes in his father; Heart attack in his mother; Prostate cancer in his father. Allergies  Allergen Reactions   Ciprofloxacin Other (See Comments)    All over weakness   Current Outpatient Medications on File Prior to Visit  Medication Sig Dispense Refill   acetaminophen (TYLENOL) 325 MG tablet Take 2 tablets (650 mg total) by mouth every 6 (six) hours as needed for mild pain or moderate pain.     Lancet Devices (ONETOUCH DELICA PLUS LANCING) MISC      Lancets (ONETOUCH DELICA PLUS [id number]) MISC USE ONCE DAILY 100 each 3   polyethylene glycol (MIRALAX / GLYCOLAX) 17 g packet Take 17 g by mouth daily as needed for moderate constipation. (Patient not taking: Reported on 01/24/2022) 7 each 0   PROAIR HFA 108 (90 Base) MCG/ACT inhaler INHALE 2 PUFFS INTO THE  LUNGS EVERY 6 HOURS AS  NEEDED  (Patient taking differently: Inhale 2 puffs into the lungs every 6 (six) hours as needed for shortness of breath or wheezing.) 34 g 3   No current facility-administered medications on file prior to visit.        ROS:  All others reviewed and negative.  Objective        PE:  BP 136/74 (BP Location: Right Arm, Patient Position: Sitting, Cuff Size: Large)   Pulse (!) 51   Temp 98.4 F (36.9 C) (Oral)   Ht '6\' 2"'$  (1.88 m)   Wt 261 lb (118.4 kg)   SpO2 97%   BMI 33.51 kg/m  Constitutional: Pt appears in NAD               HENT: Head: NCAT.                Right Ear: External ear normal.                 Left Ear: External ear normal.                Eyes: . Pupils are equal, round, and reactive to light. Conjunctivae and EOM are normal               Nose: without d/c or deformity               Neck: Neck supple. Gross normal ROM               Cardiovascular: Normal rate and regular rhythm.                 Pulmonary/Chest: Effort normal and breath sounds without rales or wheezing.                Abd:  Soft, NT, ND, + BS, no organomegaly               Neurological: Pt is alert. At baseline orientation, motor grossly intact               Skin: Skin is warm. No rashes, no other new lesions, LE edema - trace bilateral                Psychiatric: Pt behavior is normal without agitation   Micro: none  Cardiac tracings I have personally interpreted today:  none  Pertinent Radiological findings (summarize): none   Lab Results  Component Value Date   WBC 6.1 03/10/2022   HGB 12.7 (L) 03/10/2022   HCT 40.0 03/10/2022   PLT 223.0 03/10/2022   GLUCOSE 111 (H) 03/10/2022   CHOL 102 03/10/2022   TRIG 77.0 03/10/2022   HDL 29.10 (L) 03/10/2022   LDLCALC 57 03/10/2022   ALT 9 03/10/2022   AST 17 03/10/2022   NA 137 03/10/2022   K 3.6 03/10/2022   CL 105 03/10/2022   CREATININE 1.61 (H) 03/10/2022   BUN 18 03/10/2022   CO2 26 03/10/2022   TSH 1.363 01/24/2022   PSA 0.00  (L) 11/18/2021   INR 1.2 01/24/2022   HGBA1C 7.0 (H) 03/10/2022   MICROALBUR 176.5 (H) 11/18/2021   Assessment/Plan:  [name] is a 76 y.o. Black or African American [2] male with  has a past medical history of Adenoma (05/2008), Alcoholism in recovery [facility]), BPH (benign prostatic hyperplasia), Cancer ([place]), Chronic LBP, Colon polyps, Complex renal cyst (12/2010), Controlled type 2 diabetes mellitus with neuropathy ([place]) (01/2007), COPD (chronic obstructive pulmonary disease) ([place]), Diabetes ([place]), Diverticulitis of colon, ED (erectile dysfunction), History of prostate cancer, History of sick sinus syndrome, HLD (hyperlipidemia), Hypertension, Impaired glucose tolerance (12/21/2013), Nephrolithiasis, Osteoarthritis of both hips, Osteoarthritis of lumbosacral spine, PAF (paroxysmal atrial fibrillation) ([place]), Paresthesias/numbness, and Peripheral neuropathy (12/21/2013).  Diabetes mellitus with neuropathy ([place]) Lab Results  Component Value Date   HGBA1C 7.0 (H) 03/10/2022   Stable, pt to continue current medical treatment  - diet, wt control, excercise   HTN (hypertension) BP Readings from Last 3 Encounters:  03/10/22 136/74  01/30/22 (!) 148/92  11/18/21 (!) 148/82   Stable, pt to continue medical treatment coreg 3.125 bid, card LA 120 qd, lasix 40 qd prn,  Hyperlipidemia  with target LDL less than 100 Lab Results  Component Value Date   LDLCALC 57 03/10/2022   Stable, pt to continue current statin crestor 20 mg qd   Vitamin D deficiency Last vitamin D Lab Results  Component Value Date   VD25OH 10.10 (L) 11/18/2021   Low reminded to start oral replacement  Followup: Return in about 6 months (around 09/10/2022).  Cathlean Cower, MD 03/12/2022 8:28 PM [facility]

## 2022-03-11 DIAGNOSIS — I4729 Other ventricular tachycardia: Secondary | ICD-10-CM | POA: Diagnosis not present

## 2022-03-11 DIAGNOSIS — L97812 Non-pressure chronic ulcer of other part of right lower leg with fat layer exposed: Secondary | ICD-10-CM | POA: Diagnosis not present

## 2022-03-11 DIAGNOSIS — I130 Hypertensive heart and chronic kidney disease with heart failure and stage 1 through stage 4 chronic kidney disease, or unspecified chronic kidney disease: Secondary | ICD-10-CM | POA: Diagnosis not present

## 2022-03-11 DIAGNOSIS — Z87440 Personal history of urinary (tract) infections: Secondary | ICD-10-CM | POA: Diagnosis not present

## 2022-03-11 DIAGNOSIS — Z8616 Personal history of COVID-19: Secondary | ICD-10-CM | POA: Diagnosis not present

## 2022-03-11 DIAGNOSIS — J449 Chronic obstructive pulmonary disease, unspecified: Secondary | ICD-10-CM | POA: Diagnosis not present

## 2022-03-11 DIAGNOSIS — R634 Abnormal weight loss: Secondary | ICD-10-CM | POA: Diagnosis not present

## 2022-03-11 DIAGNOSIS — E1140 Type 2 diabetes mellitus with diabetic neuropathy, unspecified: Secondary | ICD-10-CM | POA: Diagnosis not present

## 2022-03-11 DIAGNOSIS — E1122 Type 2 diabetes mellitus with diabetic chronic kidney disease: Secondary | ICD-10-CM | POA: Diagnosis not present

## 2022-03-11 DIAGNOSIS — N1832 Chronic kidney disease, stage 3b: Secondary | ICD-10-CM | POA: Diagnosis not present

## 2022-03-11 DIAGNOSIS — I69322 Dysarthria following cerebral infarction: Secondary | ICD-10-CM | POA: Diagnosis not present

## 2022-03-11 DIAGNOSIS — I872 Venous insufficiency (chronic) (peripheral): Secondary | ICD-10-CM | POA: Diagnosis not present

## 2022-03-11 DIAGNOSIS — I5032 Chronic diastolic (congestive) heart failure: Secondary | ICD-10-CM | POA: Diagnosis not present

## 2022-03-11 DIAGNOSIS — Z7901 Long term (current) use of anticoagulants: Secondary | ICD-10-CM | POA: Diagnosis not present

## 2022-03-11 DIAGNOSIS — K5730 Diverticulosis of large intestine without perforation or abscess without bleeding: Secondary | ICD-10-CM | POA: Diagnosis not present

## 2022-03-11 DIAGNOSIS — M47812 Spondylosis without myelopathy or radiculopathy, cervical region: Secondary | ICD-10-CM | POA: Diagnosis not present

## 2022-03-11 DIAGNOSIS — I480 Paroxysmal atrial fibrillation: Secondary | ICD-10-CM | POA: Diagnosis not present

## 2022-03-11 DIAGNOSIS — M160 Bilateral primary osteoarthritis of hip: Secondary | ICD-10-CM | POA: Diagnosis not present

## 2022-03-11 DIAGNOSIS — E1151 Type 2 diabetes mellitus with diabetic peripheral angiopathy without gangrene: Secondary | ICD-10-CM | POA: Diagnosis not present

## 2022-03-11 DIAGNOSIS — I69351 Hemiplegia and hemiparesis following cerebral infarction affecting right dominant side: Secondary | ICD-10-CM | POA: Diagnosis not present

## 2022-03-11 DIAGNOSIS — G8929 Other chronic pain: Secondary | ICD-10-CM | POA: Diagnosis not present

## 2022-03-11 DIAGNOSIS — C9000 Multiple myeloma not having achieved remission: Secondary | ICD-10-CM | POA: Diagnosis not present

## 2022-03-11 DIAGNOSIS — E785 Hyperlipidemia, unspecified: Secondary | ICD-10-CM | POA: Diagnosis not present

## 2022-03-11 LAB — BASIC METABOLIC PANEL
BUN: 18 mg/dL (ref 6–23)
CO2: 26 mEq/L (ref 19–32)
Calcium: 8.7 mg/dL (ref 8.4–10.5)
Chloride: 105 mEq/L (ref 96–112)
Creatinine, Ser: 1.61 mg/dL — ABNORMAL HIGH (ref 0.40–1.50)
GFR: 41.65 mL/min — ABNORMAL LOW (ref >60.00)
Glucose, Bld: 111 mg/dL — ABNORMAL HIGH (ref 70–99)
Potassium: 3.6 mEq/L (ref 3.5–5.1)
Sodium: 137 mEq/L (ref 135–145)

## 2022-03-11 LAB — LIPID PANEL
Cholesterol: 102 mg/dL (ref 0–200)
HDL: 29.1 mg/dL — ABNORMAL LOW (ref >39.00)
LDL Cholesterol: 57 mg/dL (ref 0–99)
NonHDL: 72.67
Total CHOL/HDL Ratio: 3
Triglycerides: 77 mg/dL (ref 0.0–149.0)
VLDL: 15.4 mg/dL (ref 0.0–40.0)

## 2022-03-11 LAB — CBC WITH DIFFERENTIAL/PLATELET
Basophils Absolute: 0 10*3/uL (ref 0.0–0.1)
Basophils Relative: 0.8 % (ref 0.0–3.0)
Eosinophils Absolute: 0.1 10*3/uL (ref 0.0–0.7)
Eosinophils Relative: 1.7 % (ref 0.0–5.0)
HCT: 40 % (ref 39.0–52.0)
Hemoglobin: 12.7 g/dL — ABNORMAL LOW (ref 13.0–17.0)
Lymphocytes Relative: 23.5 % (ref 12.0–46.0)
Lymphs Abs: 1.4 10*3/uL (ref 0.7–4.0)
MCHC: 31.9 g/dL (ref 30.0–36.0)
MCV: 84.8 fl (ref 78.0–100.0)
Monocytes Absolute: 0.6 10*3/uL (ref 0.1–1.0)
Monocytes Relative: 10.1 % (ref 3.0–12.0)
Neutro Abs: 3.9 10*3/uL (ref 1.4–7.7)
Neutrophils Relative %: 63.9 % (ref 43.0–77.0)
Platelets: 223 10*3/uL (ref 150.0–400.0)
RBC: 4.72 Mil/uL (ref 4.22–5.81)
RDW: 15.6 % — ABNORMAL HIGH (ref 11.5–15.5)
WBC: 6.1 10*3/uL (ref 4.0–10.5)

## 2022-03-11 LAB — HEPATIC FUNCTION PANEL
ALT: 9 U/L (ref 0–53)
AST: 17 U/L (ref 0–37)
Albumin: 2.7 g/dL — ABNORMAL LOW (ref 3.5–5.2)
Alkaline Phosphatase: 142 U/L — ABNORMAL HIGH (ref 39–117)
Bilirubin, Direct: 0.1 mg/dL (ref 0.0–0.3)
Total Bilirubin: 0.3 mg/dL (ref 0.2–1.2)
Total Protein: 7.7 g/dL (ref 6.0–8.3)

## 2022-03-11 LAB — HEMOGLOBIN A1C: Hgb A1c MFr Bld: 7 % — ABNORMAL HIGH (ref 4.6–6.5)

## 2022-03-12 ENCOUNTER — Encounter: Payer: Self-pay | Admitting: Internal Medicine

## 2022-03-12 DIAGNOSIS — I5032 Chronic diastolic (congestive) heart failure: Secondary | ICD-10-CM | POA: Diagnosis not present

## 2022-03-12 DIAGNOSIS — I872 Venous insufficiency (chronic) (peripheral): Secondary | ICD-10-CM | POA: Diagnosis not present

## 2022-03-12 DIAGNOSIS — N1832 Chronic kidney disease, stage 3b: Secondary | ICD-10-CM | POA: Diagnosis not present

## 2022-03-12 DIAGNOSIS — E785 Hyperlipidemia, unspecified: Secondary | ICD-10-CM | POA: Diagnosis not present

## 2022-03-12 DIAGNOSIS — M47812 Spondylosis without myelopathy or radiculopathy, cervical region: Secondary | ICD-10-CM | POA: Diagnosis not present

## 2022-03-12 DIAGNOSIS — G8929 Other chronic pain: Secondary | ICD-10-CM | POA: Diagnosis not present

## 2022-03-12 DIAGNOSIS — J449 Chronic obstructive pulmonary disease, unspecified: Secondary | ICD-10-CM | POA: Diagnosis not present

## 2022-03-12 DIAGNOSIS — Z8616 Personal history of COVID-19: Secondary | ICD-10-CM | POA: Diagnosis not present

## 2022-03-12 DIAGNOSIS — K5730 Diverticulosis of large intestine without perforation or abscess without bleeding: Secondary | ICD-10-CM | POA: Diagnosis not present

## 2022-03-12 DIAGNOSIS — R634 Abnormal weight loss: Secondary | ICD-10-CM | POA: Diagnosis not present

## 2022-03-12 DIAGNOSIS — M160 Bilateral primary osteoarthritis of hip: Secondary | ICD-10-CM | POA: Diagnosis not present

## 2022-03-12 DIAGNOSIS — E1122 Type 2 diabetes mellitus with diabetic chronic kidney disease: Secondary | ICD-10-CM | POA: Diagnosis not present

## 2022-03-12 DIAGNOSIS — Z87440 Personal history of urinary (tract) infections: Secondary | ICD-10-CM | POA: Diagnosis not present

## 2022-03-12 DIAGNOSIS — E1140 Type 2 diabetes mellitus with diabetic neuropathy, unspecified: Secondary | ICD-10-CM | POA: Diagnosis not present

## 2022-03-12 DIAGNOSIS — I480 Paroxysmal atrial fibrillation: Secondary | ICD-10-CM | POA: Diagnosis not present

## 2022-03-12 DIAGNOSIS — Z7901 Long term (current) use of anticoagulants: Secondary | ICD-10-CM | POA: Diagnosis not present

## 2022-03-12 DIAGNOSIS — I130 Hypertensive heart and chronic kidney disease with heart failure and stage 1 through stage 4 chronic kidney disease, or unspecified chronic kidney disease: Secondary | ICD-10-CM | POA: Diagnosis not present

## 2022-03-12 DIAGNOSIS — I69351 Hemiplegia and hemiparesis following cerebral infarction affecting right dominant side: Secondary | ICD-10-CM | POA: Diagnosis not present

## 2022-03-12 DIAGNOSIS — I69322 Dysarthria following cerebral infarction: Secondary | ICD-10-CM | POA: Diagnosis not present

## 2022-03-12 DIAGNOSIS — C9000 Multiple myeloma not having achieved remission: Secondary | ICD-10-CM | POA: Diagnosis not present

## 2022-03-12 DIAGNOSIS — E1151 Type 2 diabetes mellitus with diabetic peripheral angiopathy without gangrene: Secondary | ICD-10-CM | POA: Diagnosis not present

## 2022-03-12 DIAGNOSIS — I4729 Other ventricular tachycardia: Secondary | ICD-10-CM | POA: Diagnosis not present

## 2022-03-12 DIAGNOSIS — L97812 Non-pressure chronic ulcer of other part of right lower leg with fat layer exposed: Secondary | ICD-10-CM | POA: Diagnosis not present

## 2022-03-12 NOTE — Assessment & Plan Note (Signed)
Last vitamin D Lab Results  Component Value Date   VD25OH 10.10 (L) 11/18/2021   Low reminded to start oral replacement

## 2022-03-12 NOTE — Assessment & Plan Note (Signed)
Lab Results  Component Value Date   LDLCALC 57 03/10/2022   Stable, pt to continue current statin crestor 20 mg qd

## 2022-03-12 NOTE — Assessment & Plan Note (Addendum)
BP Readings from Last 3 Encounters:  03/10/22 136/74  01/30/22 (!) 148/92  11/18/21 (!) 148/82   Stable, pt to continue medical treatment coreg 3.125 bid, card LA 120 qd, lasix 40 qd prn,

## 2022-03-12 NOTE — Assessment & Plan Note (Signed)
Lab Results  Component Value Date   HGBA1C 7.0 (H) 03/10/2022   Stable, pt to continue current medical treatment  - diet, wt control, excercise 

## 2022-03-14 DIAGNOSIS — G8929 Other chronic pain: Secondary | ICD-10-CM | POA: Diagnosis not present

## 2022-03-14 DIAGNOSIS — I5032 Chronic diastolic (congestive) heart failure: Secondary | ICD-10-CM | POA: Diagnosis not present

## 2022-03-14 DIAGNOSIS — Z87440 Personal history of urinary (tract) infections: Secondary | ICD-10-CM | POA: Diagnosis not present

## 2022-03-14 DIAGNOSIS — E1140 Type 2 diabetes mellitus with diabetic neuropathy, unspecified: Secondary | ICD-10-CM | POA: Diagnosis not present

## 2022-03-14 DIAGNOSIS — I872 Venous insufficiency (chronic) (peripheral): Secondary | ICD-10-CM | POA: Diagnosis not present

## 2022-03-14 DIAGNOSIS — E1122 Type 2 diabetes mellitus with diabetic chronic kidney disease: Secondary | ICD-10-CM | POA: Diagnosis not present

## 2022-03-14 DIAGNOSIS — M160 Bilateral primary osteoarthritis of hip: Secondary | ICD-10-CM | POA: Diagnosis not present

## 2022-03-14 DIAGNOSIS — Z7901 Long term (current) use of anticoagulants: Secondary | ICD-10-CM | POA: Diagnosis not present

## 2022-03-14 DIAGNOSIS — Z8616 Personal history of COVID-19: Secondary | ICD-10-CM | POA: Diagnosis not present

## 2022-03-14 DIAGNOSIS — M47812 Spondylosis without myelopathy or radiculopathy, cervical region: Secondary | ICD-10-CM | POA: Diagnosis not present

## 2022-03-14 DIAGNOSIS — E1151 Type 2 diabetes mellitus with diabetic peripheral angiopathy without gangrene: Secondary | ICD-10-CM | POA: Diagnosis not present

## 2022-03-14 DIAGNOSIS — I4729 Other ventricular tachycardia: Secondary | ICD-10-CM | POA: Diagnosis not present

## 2022-03-14 DIAGNOSIS — J449 Chronic obstructive pulmonary disease, unspecified: Secondary | ICD-10-CM | POA: Diagnosis not present

## 2022-03-14 DIAGNOSIS — I69322 Dysarthria following cerebral infarction: Secondary | ICD-10-CM | POA: Diagnosis not present

## 2022-03-14 DIAGNOSIS — K5730 Diverticulosis of large intestine without perforation or abscess without bleeding: Secondary | ICD-10-CM | POA: Diagnosis not present

## 2022-03-14 DIAGNOSIS — I130 Hypertensive heart and chronic kidney disease with heart failure and stage 1 through stage 4 chronic kidney disease, or unspecified chronic kidney disease: Secondary | ICD-10-CM | POA: Diagnosis not present

## 2022-03-14 DIAGNOSIS — R634 Abnormal weight loss: Secondary | ICD-10-CM | POA: Diagnosis not present

## 2022-03-14 DIAGNOSIS — I480 Paroxysmal atrial fibrillation: Secondary | ICD-10-CM | POA: Diagnosis not present

## 2022-03-14 DIAGNOSIS — I69351 Hemiplegia and hemiparesis following cerebral infarction affecting right dominant side: Secondary | ICD-10-CM | POA: Diagnosis not present

## 2022-03-14 DIAGNOSIS — N1832 Chronic kidney disease, stage 3b: Secondary | ICD-10-CM | POA: Diagnosis not present

## 2022-03-14 DIAGNOSIS — C9000 Multiple myeloma not having achieved remission: Secondary | ICD-10-CM | POA: Diagnosis not present

## 2022-03-14 DIAGNOSIS — E785 Hyperlipidemia, unspecified: Secondary | ICD-10-CM | POA: Diagnosis not present

## 2022-03-14 DIAGNOSIS — L97812 Non-pressure chronic ulcer of other part of right lower leg with fat layer exposed: Secondary | ICD-10-CM | POA: Diagnosis not present

## 2022-03-17 DIAGNOSIS — M160 Bilateral primary osteoarthritis of hip: Secondary | ICD-10-CM | POA: Diagnosis not present

## 2022-03-17 DIAGNOSIS — E785 Hyperlipidemia, unspecified: Secondary | ICD-10-CM | POA: Diagnosis not present

## 2022-03-17 DIAGNOSIS — Z8616 Personal history of COVID-19: Secondary | ICD-10-CM | POA: Diagnosis not present

## 2022-03-17 DIAGNOSIS — I480 Paroxysmal atrial fibrillation: Secondary | ICD-10-CM | POA: Diagnosis not present

## 2022-03-17 DIAGNOSIS — M47812 Spondylosis without myelopathy or radiculopathy, cervical region: Secondary | ICD-10-CM | POA: Diagnosis not present

## 2022-03-17 DIAGNOSIS — E1140 Type 2 diabetes mellitus with diabetic neuropathy, unspecified: Secondary | ICD-10-CM | POA: Diagnosis not present

## 2022-03-17 DIAGNOSIS — I69351 Hemiplegia and hemiparesis following cerebral infarction affecting right dominant side: Secondary | ICD-10-CM | POA: Diagnosis not present

## 2022-03-17 DIAGNOSIS — I5032 Chronic diastolic (congestive) heart failure: Secondary | ICD-10-CM | POA: Diagnosis not present

## 2022-03-17 DIAGNOSIS — K5730 Diverticulosis of large intestine without perforation or abscess without bleeding: Secondary | ICD-10-CM | POA: Diagnosis not present

## 2022-03-17 DIAGNOSIS — C9000 Multiple myeloma not having achieved remission: Secondary | ICD-10-CM | POA: Diagnosis not present

## 2022-03-17 DIAGNOSIS — E1151 Type 2 diabetes mellitus with diabetic peripheral angiopathy without gangrene: Secondary | ICD-10-CM | POA: Diagnosis not present

## 2022-03-17 DIAGNOSIS — Z7901 Long term (current) use of anticoagulants: Secondary | ICD-10-CM | POA: Diagnosis not present

## 2022-03-17 DIAGNOSIS — Z87440 Personal history of urinary (tract) infections: Secondary | ICD-10-CM | POA: Diagnosis not present

## 2022-03-17 DIAGNOSIS — I69322 Dysarthria following cerebral infarction: Secondary | ICD-10-CM | POA: Diagnosis not present

## 2022-03-17 DIAGNOSIS — R634 Abnormal weight loss: Secondary | ICD-10-CM | POA: Diagnosis not present

## 2022-03-17 DIAGNOSIS — N1832 Chronic kidney disease, stage 3b: Secondary | ICD-10-CM | POA: Diagnosis not present

## 2022-03-17 DIAGNOSIS — I130 Hypertensive heart and chronic kidney disease with heart failure and stage 1 through stage 4 chronic kidney disease, or unspecified chronic kidney disease: Secondary | ICD-10-CM | POA: Diagnosis not present

## 2022-03-17 DIAGNOSIS — I4729 Other ventricular tachycardia: Secondary | ICD-10-CM | POA: Diagnosis not present

## 2022-03-17 DIAGNOSIS — E1122 Type 2 diabetes mellitus with diabetic chronic kidney disease: Secondary | ICD-10-CM | POA: Diagnosis not present

## 2022-03-17 DIAGNOSIS — G8929 Other chronic pain: Secondary | ICD-10-CM | POA: Diagnosis not present

## 2022-03-17 DIAGNOSIS — I872 Venous insufficiency (chronic) (peripheral): Secondary | ICD-10-CM | POA: Diagnosis not present

## 2022-03-17 DIAGNOSIS — J449 Chronic obstructive pulmonary disease, unspecified: Secondary | ICD-10-CM | POA: Diagnosis not present

## 2022-03-17 DIAGNOSIS — L97812 Non-pressure chronic ulcer of other part of right lower leg with fat layer exposed: Secondary | ICD-10-CM | POA: Diagnosis not present

## 2022-03-19 ENCOUNTER — Telehealth: Payer: Self-pay | Admitting: Internal Medicine

## 2022-03-19 DIAGNOSIS — L97812 Non-pressure chronic ulcer of other part of right lower leg with fat layer exposed: Secondary | ICD-10-CM | POA: Diagnosis not present

## 2022-03-19 DIAGNOSIS — I480 Paroxysmal atrial fibrillation: Secondary | ICD-10-CM | POA: Diagnosis not present

## 2022-03-19 DIAGNOSIS — I69351 Hemiplegia and hemiparesis following cerebral infarction affecting right dominant side: Secondary | ICD-10-CM | POA: Diagnosis not present

## 2022-03-19 DIAGNOSIS — I69322 Dysarthria following cerebral infarction: Secondary | ICD-10-CM | POA: Diagnosis not present

## 2022-03-19 DIAGNOSIS — C9000 Multiple myeloma not having achieved remission: Secondary | ICD-10-CM | POA: Diagnosis not present

## 2022-03-19 DIAGNOSIS — Z7901 Long term (current) use of anticoagulants: Secondary | ICD-10-CM | POA: Diagnosis not present

## 2022-03-19 DIAGNOSIS — I5032 Chronic diastolic (congestive) heart failure: Secondary | ICD-10-CM | POA: Diagnosis not present

## 2022-03-19 DIAGNOSIS — R634 Abnormal weight loss: Secondary | ICD-10-CM | POA: Diagnosis not present

## 2022-03-19 DIAGNOSIS — E1122 Type 2 diabetes mellitus with diabetic chronic kidney disease: Secondary | ICD-10-CM | POA: Diagnosis not present

## 2022-03-19 DIAGNOSIS — J449 Chronic obstructive pulmonary disease, unspecified: Secondary | ICD-10-CM | POA: Diagnosis not present

## 2022-03-19 DIAGNOSIS — M160 Bilateral primary osteoarthritis of hip: Secondary | ICD-10-CM | POA: Diagnosis not present

## 2022-03-19 DIAGNOSIS — M47812 Spondylosis without myelopathy or radiculopathy, cervical region: Secondary | ICD-10-CM | POA: Diagnosis not present

## 2022-03-19 DIAGNOSIS — I4729 Other ventricular tachycardia: Secondary | ICD-10-CM | POA: Diagnosis not present

## 2022-03-19 DIAGNOSIS — Z8616 Personal history of COVID-19: Secondary | ICD-10-CM | POA: Diagnosis not present

## 2022-03-19 DIAGNOSIS — E1151 Type 2 diabetes mellitus with diabetic peripheral angiopathy without gangrene: Secondary | ICD-10-CM | POA: Diagnosis not present

## 2022-03-19 DIAGNOSIS — I130 Hypertensive heart and chronic kidney disease with heart failure and stage 1 through stage 4 chronic kidney disease, or unspecified chronic kidney disease: Secondary | ICD-10-CM | POA: Diagnosis not present

## 2022-03-19 DIAGNOSIS — N1832 Chronic kidney disease, stage 3b: Secondary | ICD-10-CM | POA: Diagnosis not present

## 2022-03-19 DIAGNOSIS — I872 Venous insufficiency (chronic) (peripheral): Secondary | ICD-10-CM | POA: Diagnosis not present

## 2022-03-19 DIAGNOSIS — E785 Hyperlipidemia, unspecified: Secondary | ICD-10-CM | POA: Diagnosis not present

## 2022-03-19 DIAGNOSIS — G8929 Other chronic pain: Secondary | ICD-10-CM | POA: Diagnosis not present

## 2022-03-19 DIAGNOSIS — K5730 Diverticulosis of large intestine without perforation or abscess without bleeding: Secondary | ICD-10-CM | POA: Diagnosis not present

## 2022-03-19 DIAGNOSIS — Z87440 Personal history of urinary (tract) infections: Secondary | ICD-10-CM | POA: Diagnosis not present

## 2022-03-19 DIAGNOSIS — E1140 Type 2 diabetes mellitus with diabetic neuropathy, unspecified: Secondary | ICD-10-CM | POA: Diagnosis not present

## 2022-03-19 NOTE — Telephone Encounter (Signed)
Left message for Melissa with verbal orders.

## 2022-03-19 NOTE — Telephone Encounter (Signed)
Needs verbal orders - Home Health Occupational therapy 1 x a week for 6 weeks.  Messages can be left - confidential voicemail

## 2022-03-19 NOTE — Telephone Encounter (Signed)
Ok for verbals 

## 2022-03-21 DIAGNOSIS — G8929 Other chronic pain: Secondary | ICD-10-CM | POA: Diagnosis not present

## 2022-03-21 DIAGNOSIS — I872 Venous insufficiency (chronic) (peripheral): Secondary | ICD-10-CM | POA: Diagnosis not present

## 2022-03-21 DIAGNOSIS — L97812 Non-pressure chronic ulcer of other part of right lower leg with fat layer exposed: Secondary | ICD-10-CM | POA: Diagnosis not present

## 2022-03-21 DIAGNOSIS — N1832 Chronic kidney disease, stage 3b: Secondary | ICD-10-CM | POA: Diagnosis not present

## 2022-03-21 DIAGNOSIS — Z8616 Personal history of COVID-19: Secondary | ICD-10-CM | POA: Diagnosis not present

## 2022-03-21 DIAGNOSIS — I69351 Hemiplegia and hemiparesis following cerebral infarction affecting right dominant side: Secondary | ICD-10-CM | POA: Diagnosis not present

## 2022-03-21 DIAGNOSIS — I130 Hypertensive heart and chronic kidney disease with heart failure and stage 1 through stage 4 chronic kidney disease, or unspecified chronic kidney disease: Secondary | ICD-10-CM | POA: Diagnosis not present

## 2022-03-21 DIAGNOSIS — Z7901 Long term (current) use of anticoagulants: Secondary | ICD-10-CM | POA: Diagnosis not present

## 2022-03-21 DIAGNOSIS — I5032 Chronic diastolic (congestive) heart failure: Secondary | ICD-10-CM | POA: Diagnosis not present

## 2022-03-21 DIAGNOSIS — E785 Hyperlipidemia, unspecified: Secondary | ICD-10-CM | POA: Diagnosis not present

## 2022-03-21 DIAGNOSIS — Z87440 Personal history of urinary (tract) infections: Secondary | ICD-10-CM | POA: Diagnosis not present

## 2022-03-21 DIAGNOSIS — I4729 Other ventricular tachycardia: Secondary | ICD-10-CM | POA: Diagnosis not present

## 2022-03-21 DIAGNOSIS — M160 Bilateral primary osteoarthritis of hip: Secondary | ICD-10-CM | POA: Diagnosis not present

## 2022-03-21 DIAGNOSIS — E1151 Type 2 diabetes mellitus with diabetic peripheral angiopathy without gangrene: Secondary | ICD-10-CM | POA: Diagnosis not present

## 2022-03-21 DIAGNOSIS — I69322 Dysarthria following cerebral infarction: Secondary | ICD-10-CM | POA: Diagnosis not present

## 2022-03-21 DIAGNOSIS — M47812 Spondylosis without myelopathy or radiculopathy, cervical region: Secondary | ICD-10-CM | POA: Diagnosis not present

## 2022-03-21 DIAGNOSIS — I480 Paroxysmal atrial fibrillation: Secondary | ICD-10-CM | POA: Diagnosis not present

## 2022-03-21 DIAGNOSIS — C9000 Multiple myeloma not having achieved remission: Secondary | ICD-10-CM | POA: Diagnosis not present

## 2022-03-21 DIAGNOSIS — R634 Abnormal weight loss: Secondary | ICD-10-CM | POA: Diagnosis not present

## 2022-03-21 DIAGNOSIS — K5730 Diverticulosis of large intestine without perforation or abscess without bleeding: Secondary | ICD-10-CM | POA: Diagnosis not present

## 2022-03-21 DIAGNOSIS — E1140 Type 2 diabetes mellitus with diabetic neuropathy, unspecified: Secondary | ICD-10-CM | POA: Diagnosis not present

## 2022-03-21 DIAGNOSIS — E1122 Type 2 diabetes mellitus with diabetic chronic kidney disease: Secondary | ICD-10-CM | POA: Diagnosis not present

## 2022-03-21 DIAGNOSIS — J449 Chronic obstructive pulmonary disease, unspecified: Secondary | ICD-10-CM | POA: Diagnosis not present

## 2022-03-25 DIAGNOSIS — C9000 Multiple myeloma not having achieved remission: Secondary | ICD-10-CM | POA: Diagnosis not present

## 2022-03-25 DIAGNOSIS — M47812 Spondylosis without myelopathy or radiculopathy, cervical region: Secondary | ICD-10-CM | POA: Diagnosis not present

## 2022-03-25 DIAGNOSIS — I5032 Chronic diastolic (congestive) heart failure: Secondary | ICD-10-CM | POA: Diagnosis not present

## 2022-03-25 DIAGNOSIS — Z8616 Personal history of COVID-19: Secondary | ICD-10-CM | POA: Diagnosis not present

## 2022-03-25 DIAGNOSIS — M179 Osteoarthritis of knee, unspecified: Secondary | ICD-10-CM | POA: Diagnosis not present

## 2022-03-25 DIAGNOSIS — I69351 Hemiplegia and hemiparesis following cerebral infarction affecting right dominant side: Secondary | ICD-10-CM | POA: Diagnosis not present

## 2022-03-25 DIAGNOSIS — E785 Hyperlipidemia, unspecified: Secondary | ICD-10-CM | POA: Diagnosis not present

## 2022-03-25 DIAGNOSIS — N183 Chronic kidney disease, stage 3 unspecified: Secondary | ICD-10-CM | POA: Diagnosis not present

## 2022-03-25 DIAGNOSIS — I69322 Dysarthria following cerebral infarction: Secondary | ICD-10-CM | POA: Diagnosis not present

## 2022-03-25 DIAGNOSIS — Z7901 Long term (current) use of anticoagulants: Secondary | ICD-10-CM | POA: Diagnosis not present

## 2022-03-25 DIAGNOSIS — R634 Abnormal weight loss: Secondary | ICD-10-CM | POA: Diagnosis not present

## 2022-03-25 DIAGNOSIS — I480 Paroxysmal atrial fibrillation: Secondary | ICD-10-CM | POA: Diagnosis not present

## 2022-03-25 DIAGNOSIS — E1122 Type 2 diabetes mellitus with diabetic chronic kidney disease: Secondary | ICD-10-CM | POA: Diagnosis not present

## 2022-03-25 DIAGNOSIS — Z87440 Personal history of urinary (tract) infections: Secondary | ICD-10-CM | POA: Diagnosis not present

## 2022-03-25 DIAGNOSIS — I130 Hypertensive heart and chronic kidney disease with heart failure and stage 1 through stage 4 chronic kidney disease, or unspecified chronic kidney disease: Secondary | ICD-10-CM | POA: Diagnosis not present

## 2022-03-25 DIAGNOSIS — K5730 Diverticulosis of large intestine without perforation or abscess without bleeding: Secondary | ICD-10-CM | POA: Diagnosis not present

## 2022-03-25 DIAGNOSIS — E1140 Type 2 diabetes mellitus with diabetic neuropathy, unspecified: Secondary | ICD-10-CM | POA: Diagnosis not present

## 2022-03-25 DIAGNOSIS — I872 Venous insufficiency (chronic) (peripheral): Secondary | ICD-10-CM | POA: Diagnosis not present

## 2022-03-25 DIAGNOSIS — E1151 Type 2 diabetes mellitus with diabetic peripheral angiopathy without gangrene: Secondary | ICD-10-CM | POA: Diagnosis not present

## 2022-03-25 DIAGNOSIS — J449 Chronic obstructive pulmonary disease, unspecified: Secondary | ICD-10-CM | POA: Diagnosis not present

## 2022-03-25 DIAGNOSIS — G8929 Other chronic pain: Secondary | ICD-10-CM | POA: Diagnosis not present

## 2022-03-25 DIAGNOSIS — N1832 Chronic kidney disease, stage 3b: Secondary | ICD-10-CM | POA: Diagnosis not present

## 2022-03-25 DIAGNOSIS — I4729 Other ventricular tachycardia: Secondary | ICD-10-CM | POA: Diagnosis not present

## 2022-03-25 DIAGNOSIS — L97812 Non-pressure chronic ulcer of other part of right lower leg with fat layer exposed: Secondary | ICD-10-CM | POA: Diagnosis not present

## 2022-03-25 DIAGNOSIS — M1990 Unspecified osteoarthritis, unspecified site: Secondary | ICD-10-CM | POA: Diagnosis not present

## 2022-03-25 DIAGNOSIS — M160 Bilateral primary osteoarthritis of hip: Secondary | ICD-10-CM | POA: Diagnosis not present

## 2022-03-26 ENCOUNTER — Telehealth: Payer: Self-pay | Admitting: Internal Medicine

## 2022-03-26 MED ORDER — DILTIAZEM HCL ER 120 MG PO TB24
120.0000 mg | ORAL_TABLET | Freq: Every day | ORAL | 3 refills | Status: DC
Start: 2022-03-26 — End: 2022-06-03

## 2022-03-26 NOTE — Telephone Encounter (Signed)
Mail order pharmacy states they did not receive refill request for: diltiazem (CARDIZEM LA) 120 MG 24 hr tablet   Daughter is requesting that we send refill to local pharmacy because he only has one dose left. [place] ([place]), [employer] - [address]

## 2022-03-26 NOTE — Telephone Encounter (Signed)
done

## 2022-03-26 NOTE — Telephone Encounter (Signed)
Done erx 

## 2022-03-27 DIAGNOSIS — M160 Bilateral primary osteoarthritis of hip: Secondary | ICD-10-CM | POA: Diagnosis not present

## 2022-03-27 DIAGNOSIS — E1140 Type 2 diabetes mellitus with diabetic neuropathy, unspecified: Secondary | ICD-10-CM | POA: Diagnosis not present

## 2022-03-27 DIAGNOSIS — I5032 Chronic diastolic (congestive) heart failure: Secondary | ICD-10-CM | POA: Diagnosis not present

## 2022-03-27 DIAGNOSIS — J449 Chronic obstructive pulmonary disease, unspecified: Secondary | ICD-10-CM | POA: Diagnosis not present

## 2022-03-27 DIAGNOSIS — L97812 Non-pressure chronic ulcer of other part of right lower leg with fat layer exposed: Secondary | ICD-10-CM | POA: Diagnosis not present

## 2022-03-27 DIAGNOSIS — C9000 Multiple myeloma not having achieved remission: Secondary | ICD-10-CM | POA: Diagnosis not present

## 2022-03-27 DIAGNOSIS — E785 Hyperlipidemia, unspecified: Secondary | ICD-10-CM | POA: Diagnosis not present

## 2022-03-27 DIAGNOSIS — Z8616 Personal history of COVID-19: Secondary | ICD-10-CM | POA: Diagnosis not present

## 2022-03-27 DIAGNOSIS — Z87440 Personal history of urinary (tract) infections: Secondary | ICD-10-CM | POA: Diagnosis not present

## 2022-03-27 DIAGNOSIS — E1122 Type 2 diabetes mellitus with diabetic chronic kidney disease: Secondary | ICD-10-CM | POA: Diagnosis not present

## 2022-03-27 DIAGNOSIS — M47812 Spondylosis without myelopathy or radiculopathy, cervical region: Secondary | ICD-10-CM | POA: Diagnosis not present

## 2022-03-27 DIAGNOSIS — E1151 Type 2 diabetes mellitus with diabetic peripheral angiopathy without gangrene: Secondary | ICD-10-CM | POA: Diagnosis not present

## 2022-03-27 DIAGNOSIS — I872 Venous insufficiency (chronic) (peripheral): Secondary | ICD-10-CM | POA: Diagnosis not present

## 2022-03-27 DIAGNOSIS — I130 Hypertensive heart and chronic kidney disease with heart failure and stage 1 through stage 4 chronic kidney disease, or unspecified chronic kidney disease: Secondary | ICD-10-CM | POA: Diagnosis not present

## 2022-03-27 DIAGNOSIS — I69322 Dysarthria following cerebral infarction: Secondary | ICD-10-CM | POA: Diagnosis not present

## 2022-03-27 DIAGNOSIS — I4729 Other ventricular tachycardia: Secondary | ICD-10-CM | POA: Diagnosis not present

## 2022-03-27 DIAGNOSIS — K5730 Diverticulosis of large intestine without perforation or abscess without bleeding: Secondary | ICD-10-CM | POA: Diagnosis not present

## 2022-03-27 DIAGNOSIS — I480 Paroxysmal atrial fibrillation: Secondary | ICD-10-CM | POA: Diagnosis not present

## 2022-03-27 DIAGNOSIS — Z7901 Long term (current) use of anticoagulants: Secondary | ICD-10-CM | POA: Diagnosis not present

## 2022-03-27 DIAGNOSIS — R634 Abnormal weight loss: Secondary | ICD-10-CM | POA: Diagnosis not present

## 2022-03-27 DIAGNOSIS — N1832 Chronic kidney disease, stage 3b: Secondary | ICD-10-CM | POA: Diagnosis not present

## 2022-03-27 DIAGNOSIS — G8929 Other chronic pain: Secondary | ICD-10-CM | POA: Diagnosis not present

## 2022-03-27 DIAGNOSIS — I69351 Hemiplegia and hemiparesis following cerebral infarction affecting right dominant side: Secondary | ICD-10-CM | POA: Diagnosis not present

## 2022-04-01 DIAGNOSIS — I4729 Other ventricular tachycardia: Secondary | ICD-10-CM | POA: Diagnosis not present

## 2022-04-01 DIAGNOSIS — I5032 Chronic diastolic (congestive) heart failure: Secondary | ICD-10-CM | POA: Diagnosis not present

## 2022-04-01 DIAGNOSIS — E1151 Type 2 diabetes mellitus with diabetic peripheral angiopathy without gangrene: Secondary | ICD-10-CM | POA: Diagnosis not present

## 2022-04-01 DIAGNOSIS — E785 Hyperlipidemia, unspecified: Secondary | ICD-10-CM | POA: Diagnosis not present

## 2022-04-01 DIAGNOSIS — E1140 Type 2 diabetes mellitus with diabetic neuropathy, unspecified: Secondary | ICD-10-CM | POA: Diagnosis not present

## 2022-04-01 DIAGNOSIS — I69322 Dysarthria following cerebral infarction: Secondary | ICD-10-CM | POA: Diagnosis not present

## 2022-04-01 DIAGNOSIS — R634 Abnormal weight loss: Secondary | ICD-10-CM | POA: Diagnosis not present

## 2022-04-01 DIAGNOSIS — E1122 Type 2 diabetes mellitus with diabetic chronic kidney disease: Secondary | ICD-10-CM | POA: Diagnosis not present

## 2022-04-01 DIAGNOSIS — J449 Chronic obstructive pulmonary disease, unspecified: Secondary | ICD-10-CM | POA: Diagnosis not present

## 2022-04-01 DIAGNOSIS — G8929 Other chronic pain: Secondary | ICD-10-CM | POA: Diagnosis not present

## 2022-04-01 DIAGNOSIS — Z8616 Personal history of COVID-19: Secondary | ICD-10-CM | POA: Diagnosis not present

## 2022-04-01 DIAGNOSIS — I480 Paroxysmal atrial fibrillation: Secondary | ICD-10-CM | POA: Diagnosis not present

## 2022-04-01 DIAGNOSIS — L97812 Non-pressure chronic ulcer of other part of right lower leg with fat layer exposed: Secondary | ICD-10-CM | POA: Diagnosis not present

## 2022-04-01 DIAGNOSIS — I69351 Hemiplegia and hemiparesis following cerebral infarction affecting right dominant side: Secondary | ICD-10-CM | POA: Diagnosis not present

## 2022-04-01 DIAGNOSIS — N1832 Chronic kidney disease, stage 3b: Secondary | ICD-10-CM | POA: Diagnosis not present

## 2022-04-01 DIAGNOSIS — C9000 Multiple myeloma not having achieved remission: Secondary | ICD-10-CM | POA: Diagnosis not present

## 2022-04-01 DIAGNOSIS — Z87440 Personal history of urinary (tract) infections: Secondary | ICD-10-CM | POA: Diagnosis not present

## 2022-04-01 DIAGNOSIS — I130 Hypertensive heart and chronic kidney disease with heart failure and stage 1 through stage 4 chronic kidney disease, or unspecified chronic kidney disease: Secondary | ICD-10-CM | POA: Diagnosis not present

## 2022-04-01 DIAGNOSIS — I872 Venous insufficiency (chronic) (peripheral): Secondary | ICD-10-CM | POA: Diagnosis not present

## 2022-04-01 DIAGNOSIS — M47812 Spondylosis without myelopathy or radiculopathy, cervical region: Secondary | ICD-10-CM | POA: Diagnosis not present

## 2022-04-01 DIAGNOSIS — K5730 Diverticulosis of large intestine without perforation or abscess without bleeding: Secondary | ICD-10-CM | POA: Diagnosis not present

## 2022-04-01 DIAGNOSIS — Z7901 Long term (current) use of anticoagulants: Secondary | ICD-10-CM | POA: Diagnosis not present

## 2022-04-01 DIAGNOSIS — M160 Bilateral primary osteoarthritis of hip: Secondary | ICD-10-CM | POA: Diagnosis not present

## 2022-04-02 ENCOUNTER — Ambulatory Visit (INDEPENDENT_AMBULATORY_CARE_PROVIDER_SITE_OTHER): Payer: Medicare Other | Admitting: Internal Medicine

## 2022-04-02 VITALS — BP 146/88 | HR 82 | Temp 97.7°F | Ht 74.0 in | Wt 257.0 lb

## 2022-04-02 DIAGNOSIS — E559 Vitamin D deficiency, unspecified: Secondary | ICD-10-CM | POA: Diagnosis not present

## 2022-04-02 DIAGNOSIS — I10 Essential (primary) hypertension: Secondary | ICD-10-CM | POA: Diagnosis not present

## 2022-04-02 DIAGNOSIS — Z23 Encounter for immunization: Secondary | ICD-10-CM | POA: Diagnosis not present

## 2022-04-02 DIAGNOSIS — G4452 New daily persistent headache (NDPH): Secondary | ICD-10-CM | POA: Diagnosis not present

## 2022-04-02 DIAGNOSIS — E1140 Type 2 diabetes mellitus with diabetic neuropathy, unspecified: Secondary | ICD-10-CM | POA: Diagnosis not present

## 2022-04-02 MED ORDER — ACCU-CHEK GUIDE VI STRP: ORAL_STRIP | 12 refills | Status: DC

## 2022-04-02 MED ORDER — ACCU-CHEK GUIDE ME W/DEVICE KIT
PACK | 0 refills | Status: DC
Start: 2022-04-02 — End: 2023-12-04

## 2022-04-02 NOTE — Progress Notes (Signed)
Patient ID: [name], male   DOB: Dec 23, 1945, 76 y.o.   MRN: [id number]        Chief Complaint: follow up HTN, DM, headache       HPI:  [name] is a 76 y.o. male here with c/o persistent headache now mild but just not resolved after accidental fall backwards to back of head, thinks may have hit the crown area on furniture, had gooseegg now near resolved, but area still slight swelling, tender, headache persists, no other neuro changes except for increased gait difficutly recently. And remains on eliquis.  Due for flu shot.  Has PT/OT ongoing once per wk at home.       Wt Readings from Last 3 Encounters:  04/02/22 257 lb (116.6 kg)  03/10/22 261 lb (118.4 kg)  01/24/22 147 lb 14.9 oz (67.1 kg)   BP Readings from Last 3 Encounters:  04/04/22 (!) 159/95  04/02/22 (!) 146/88  03/10/22 136/74         Past Medical History:  Diagnosis Date   Adenoma 05/2008   Alcoholism in recovery [facility])    BPH (benign prostatic hyperplasia)    Cancer (HCC)    Prostate   Chronic LBP    Hip & Back -- Sees Dr. Maia Petties @ [place]   Colon polyps    Complex renal cyst 12/2010   Controlled type 2 diabetes mellitus with neuropathy ([place]) 01/2007   COPD (chronic obstructive pulmonary disease) ([place])    Diabetes ([place])    Diverticulitis of colon    ED (erectile dysfunction)    s/p Penile prosthesis (09/2010)   History of prostate cancer    Dr. Karsten Ro   History of sick sinus syndrome    reduced BB dose for Bradycardia   HLD (hyperlipidemia)    Hypertension    Impaired glucose tolerance 12/21/2013   Nephrolithiasis    Osteoarthritis of both hips    Osteoarthritis of lumbosacral spine    with Disc Disease   PAF (paroxysmal atrial fibrillation) (HCC)    No longer on Amiodarone.  Not on Anticoaguation b/c no recurrence.   Paresthesias/numbness    Bilateral LE   Peripheral neuropathy 12/21/2013   Past Surgical History:  Procedure Laterality Date   LEFT HEART CATH AND  CORONARY ANGIOGRAPHY  2003   Normal Coronary Arteries.   NM MYOVIEW LTD  02/21/2020   EF 60%.  Medium sized mild severity defect in the basal inferior, mid inferior and apical inferior location-suggesting of ischemia.  Read as low-intermediate risk.  (On cardiology review this is felt to be a fixed defect either related to prior infarct versus diaphragmatic attenuation.  Felt to be low risk)   PENILE PROSTHESIS IMPLANT  06/21/2012   Procedure: PENILE PROTHESIS INFLATABLE;  Surgeon: Claybon Jabs, MD;  [facility];  Service: Urology;  Laterality: N/A;  REMOVAL AND REPLACEMENT OF SOME  OF PROSTHESIS (AMS)    PENILE PROSTHESIS PLACEMENT  09/2010   PROSTATECTOMY     REMOVAL OF PENILE PROSTHESIS N/A 02/15/2018   Procedure: REMOVAL OF PENILE PROSTHESIS;  Surgeon: Kathie Rhodes, MD;  Location: [facility];  Service: Urology;  Laterality: N/A;   TRANSTHORACIC ECHOCARDIOGRAM  04/2016   a) Relatively normal EF of 55-60%. No RWMA suggesting no prior MI. Gr 2 DD (pseudo-normal filling pattern) along with moderately dilated left atrium.;; b) 02/2018: Mod LVH. EF 65-70%. No RWMA.  Unable to assess DF 2/2 Afib. Mild MR. Severe BiAtrial Enlargement. High CVP.  TRANSTHORACIC ECHOCARDIOGRAM  02/17/2020   EF 60 to 65%.  No R WMA.  Unable to assess diastolic parameters because of A. fib.  Mildly elevated PA pressures.  Mild LA dilation.  Mild aortic valve sclerosis but no stenosis.  Normal IVC.    reports that he quit smoking about 39 years ago. His smoking use included cigarettes. He has never used smokeless tobacco. He reports current alcohol use. He reports that he does not use drugs. family history includes Coronary artery disease in his father and mother; Diabetes in his father; Heart attack in his mother; Prostate cancer in his father. Allergies  Allergen Reactions   Ciprofloxacin Other (See Comments)    All over weakness   Current Outpatient Medications on File Prior to Visit  Medication  Sig Dispense Refill   acetaminophen (TYLENOL) 325 MG tablet Take 2 tablets (650 mg total) by mouth every 6 (six) hours as needed for mild pain or moderate pain.     apixaban (ELIQUIS) 5 MG TABS tablet Take 1 tablet (5 mg total) by mouth 2 (two) times daily. 180 tablet 3   carvedilol (COREG) 3.125 MG tablet Take 1 tablet (3.125 mg total) by mouth 2 (two) times daily with a meal. 180 tablet 03   diltiazem (CARDIZEM LA) 120 MG 24 hr tablet Take 1 tablet (120 mg total) by mouth daily. 90 tablet 3   furosemide (LASIX) 40 MG tablet TAKE 1 TABLET BY MOUTH DAILY .  MAY TAKE AN ADDITIONAL 1 TABLET  BY MOUTH IF NEEDED FOR SWELLING 200 tablet 1   Lancet Devices (ONETOUCH DELICA PLUS LANCING) MISC      Lancets (ONETOUCH DELICA PLUS ALPFXT02I) MISC USE ONCE DAILY 100 each 3   polyethylene glycol (MIRALAX / GLYCOLAX) 17 g packet Take 17 g by mouth daily as needed for moderate constipation. 7 each 0   potassium chloride (KLOR-CON) 10 MEQ tablet Take 1 tablet (10 mEq total) by mouth daily. 90 tablet 3   PROAIR HFA 108 (90 Base) MCG/ACT inhaler INHALE 2 PUFFS INTO THE  LUNGS EVERY 6 HOURS AS  NEEDED (Patient taking differently: Inhale 2 puffs into the lungs every 6 (six) hours as needed for shortness of breath or wheezing.) 34 g 3   rosuvastatin (CRESTOR) 20 MG tablet Take 1 tablet (20 mg total) by mouth daily. 90 tablet 3   No current facility-administered medications on file prior to visit.        ROS:  All others reviewed and negative.  Objective        PE:  BP (!) 146/88   Pulse 82   Temp 97.7 F (36.5 C) (Temporal)   Ht '6\' 2"'$  (1.88 m)   Wt 257 lb (116.6 kg)   BMI 33.00 kg/m                 Constitutional: Pt appears in NAD               HENT: Head: NCAT.                Right Ear: External ear normal.                 Left Ear: External ear normal.                Eyes: . Pupils are equal, round, and reactive to light. Conjunctivae and EOM are normal               Nose: without d/c or deformity  Neck: Neck supple. Gross normal ROM               Cardiovascular: Normal rate and regular rhythm.                 Pulmonary/Chest: Effort normal and breath sounds without rales or wheezing.                Abd:  Soft, NT, ND, + BS, no organomegaly               Neurological: Pt is alert. At baseline orientation, motor grossly intact cn 2-12 intact               Skin:  LE edema - none; crown with 1.5 cm oval area mild swelling tender, no laceration               Psychiatric: Pt behavior is normal without agitation   Micro: none  Cardiac tracings I have personally interpreted today:  none  Pertinent Radiological findings (summarize): none   Lab Results  Component Value Date   WBC 7.1 04/04/2022   HGB 14.0 04/04/2022   HCT 44.3 04/04/2022   PLT 262 04/04/2022   GLUCOSE 91 04/04/2022   CHOL 102 03/10/2022   TRIG 77.0 03/10/2022   HDL 29.10 (L) 03/10/2022   LDLCALC 57 03/10/2022   ALT 9 03/10/2022   AST 17 03/10/2022   NA 138 04/04/2022   K 3.6 04/04/2022   CL 105 04/04/2022   CREATININE 1.44 (H) 04/04/2022   BUN 19 04/04/2022   CO2 25 04/04/2022   TSH 1.363 01/24/2022   PSA 0.00 (L) 11/18/2021   INR 1.4 (H) 04/04/2022   HGBA1C 7.0 (H) 03/10/2022   MICROALBUR 176.5 (H) 11/18/2021   Assessment/Plan:  [name] is a 76 y.o. Black or African American [2] male with  has a past medical history of Adenoma (05/2008), Alcoholism in recovery [facility]), BPH (benign prostatic hyperplasia), Cancer ([place]), Chronic LBP, Colon polyps, Complex renal cyst (12/2010), Controlled type 2 diabetes mellitus with neuropathy ([place]) (01/2007), COPD (chronic obstructive pulmonary disease) ([place]), Diabetes ([place]), Diverticulitis of colon, ED (erectile dysfunction), History of prostate cancer, History of sick sinus syndrome, HLD (hyperlipidemia), Hypertension, Impaired glucose tolerance (12/21/2013), Nephrolithiasis, Osteoarthritis of both hips, Osteoarthritis of lumbosacral spine, PAF (paroxysmal  atrial fibrillation) ([place]), Paresthesias/numbness, and Peripheral neuropathy (12/21/2013).  New daily persistent headache New onset sept 1 after fall to post head/crown with goosegg now improved but persistent pain, also taking eliquis - r/o bleed - for Head CT  Vitamin D deficiency Last vitamin D Lab Results  Component Value Date   VD25OH 10.10 (L) 11/18/2021   Low, pt reminded to start oral replacement   HTN (hypertension) BP Readings from Last 3 Encounters:  04/04/22 (!) 159/95  04/02/22 (!) 146/88  03/10/22 136/74   Uncontrolled, possibly reactive, pt to continue medical treatment coreg 3.125 bid, cardizem la 120 qd, lasix 40 qd   Diabetes mellitus with neuropathy ([place]) Lab Results  Component Value Date   HGBA1C 7.0 (H) 03/10/2022   Stable, pt to continue current medical treatment  -diet, wt control, excercise Followup: Return in about 3 months (around 07/02/2022).  Cathlean Cower, MD 04/05/2022 8:29 PM [facility]

## 2022-04-02 NOTE — Patient Instructions (Addendum)
Your BP was rechecked  You had the flu shot today  Your accucheck Guide Me was sent to the pharmacy with the strips  Please continue all other medications as before, and refills have been done if requested.  Please have the pharmacy call with any other refills you may need.  Please continue your efforts at being more active, low cholesterol diet, and weight control.  Please keep your appointments with your specialists as you may have planned  You will be contacted regarding the referral for: Head CT - stat  Please make an Appointment to return in 3 months, or sooner if needed

## 2022-04-02 NOTE — Assessment & Plan Note (Signed)
New onset sept 1 after fall to post head/crown with goosegg now improved but persistent pain, also taking eliquis - r/o bleed - for Head CT

## 2022-04-03 ENCOUNTER — Ambulatory Visit (HOSPITAL_COMMUNITY): Payer: Medicare Other

## 2022-04-03 ENCOUNTER — Ambulatory Visit: Payer: Self-pay | Admitting: Licensed Clinical Social Worker

## 2022-04-03 DIAGNOSIS — E1151 Type 2 diabetes mellitus with diabetic peripheral angiopathy without gangrene: Secondary | ICD-10-CM | POA: Diagnosis not present

## 2022-04-03 DIAGNOSIS — E1140 Type 2 diabetes mellitus with diabetic neuropathy, unspecified: Secondary | ICD-10-CM | POA: Diagnosis not present

## 2022-04-03 DIAGNOSIS — M47812 Spondylosis without myelopathy or radiculopathy, cervical region: Secondary | ICD-10-CM | POA: Diagnosis not present

## 2022-04-03 DIAGNOSIS — K5730 Diverticulosis of large intestine without perforation or abscess without bleeding: Secondary | ICD-10-CM | POA: Diagnosis not present

## 2022-04-03 DIAGNOSIS — J449 Chronic obstructive pulmonary disease, unspecified: Secondary | ICD-10-CM | POA: Diagnosis not present

## 2022-04-03 DIAGNOSIS — Z7901 Long term (current) use of anticoagulants: Secondary | ICD-10-CM | POA: Diagnosis not present

## 2022-04-03 DIAGNOSIS — Z87440 Personal history of urinary (tract) infections: Secondary | ICD-10-CM | POA: Diagnosis not present

## 2022-04-03 DIAGNOSIS — Z8616 Personal history of COVID-19: Secondary | ICD-10-CM | POA: Diagnosis not present

## 2022-04-03 DIAGNOSIS — I872 Venous insufficiency (chronic) (peripheral): Secondary | ICD-10-CM | POA: Diagnosis not present

## 2022-04-03 DIAGNOSIS — I5032 Chronic diastolic (congestive) heart failure: Secondary | ICD-10-CM | POA: Diagnosis not present

## 2022-04-03 DIAGNOSIS — G8929 Other chronic pain: Secondary | ICD-10-CM | POA: Diagnosis not present

## 2022-04-03 DIAGNOSIS — L97812 Non-pressure chronic ulcer of other part of right lower leg with fat layer exposed: Secondary | ICD-10-CM | POA: Diagnosis not present

## 2022-04-03 DIAGNOSIS — M160 Bilateral primary osteoarthritis of hip: Secondary | ICD-10-CM | POA: Diagnosis not present

## 2022-04-03 DIAGNOSIS — I4729 Other ventricular tachycardia: Secondary | ICD-10-CM | POA: Diagnosis not present

## 2022-04-03 DIAGNOSIS — I480 Paroxysmal atrial fibrillation: Secondary | ICD-10-CM | POA: Diagnosis not present

## 2022-04-03 DIAGNOSIS — C9000 Multiple myeloma not having achieved remission: Secondary | ICD-10-CM | POA: Diagnosis not present

## 2022-04-03 DIAGNOSIS — E1122 Type 2 diabetes mellitus with diabetic chronic kidney disease: Secondary | ICD-10-CM | POA: Diagnosis not present

## 2022-04-03 DIAGNOSIS — E785 Hyperlipidemia, unspecified: Secondary | ICD-10-CM | POA: Diagnosis not present

## 2022-04-03 DIAGNOSIS — I69351 Hemiplegia and hemiparesis following cerebral infarction affecting right dominant side: Secondary | ICD-10-CM | POA: Diagnosis not present

## 2022-04-03 DIAGNOSIS — R634 Abnormal weight loss: Secondary | ICD-10-CM | POA: Diagnosis not present

## 2022-04-03 DIAGNOSIS — I69322 Dysarthria following cerebral infarction: Secondary | ICD-10-CM | POA: Diagnosis not present

## 2022-04-03 DIAGNOSIS — N1832 Chronic kidney disease, stage 3b: Secondary | ICD-10-CM | POA: Diagnosis not present

## 2022-04-03 DIAGNOSIS — I130 Hypertensive heart and chronic kidney disease with heart failure and stage 1 through stage 4 chronic kidney disease, or unspecified chronic kidney disease: Secondary | ICD-10-CM | POA: Diagnosis not present

## 2022-04-03 NOTE — Patient Instructions (Addendum)
   It was a pleasure speaking with you today. Per your request a Cared Coordination phone appointment is scheduled 04/07/22 at 1:30  Casimer Lanius, [place] [phone]

## 2022-04-03 NOTE — Patient Outreach (Signed)
  Care Coordination  Initial Visit Note   04/03/2022 Name: [name] MRN: [id number] DOB: August 11, 1945  [name] is a 76 y.o. year old male who sees Biagio Borg, MD for primary care. I spoke with  [name] by phone today.  What matters to the patients health and wellness today?    Patient was accompanied by his daughter who provided information during this encounter..     Goals Addressed             This Visit's Progress    Care Coordination Activities       Care Coordination Interventions: Reviewed Care Coordination Services:Agreed to schedule phone appointment            SDOH assessments and interventions completed:  No   Care Coordination Interventions Activated:  Yes  Care Coordination Interventions:  Yes, provided   Follow up plan: Follow up call scheduled for 04/07/2022    Encounter Outcome:  Pt. Visit Completed   Casimer Lanius, [place] [phone]

## 2022-04-04 ENCOUNTER — Other Ambulatory Visit: Payer: Self-pay

## 2022-04-04 ENCOUNTER — Emergency Department (HOSPITAL_BASED_OUTPATIENT_CLINIC_OR_DEPARTMENT_OTHER): Payer: Medicare Other

## 2022-04-04 ENCOUNTER — Emergency Department (HOSPITAL_BASED_OUTPATIENT_CLINIC_OR_DEPARTMENT_OTHER)
Admission: EM | Admit: 2022-04-04 | Discharge: 2022-04-04 | Disposition: A | Discharge status: Home / Self Care | Payer: Medicare Other | Attending: Emergency Medicine | Admitting: Emergency Medicine

## 2022-04-04 ENCOUNTER — Telehealth: Payer: Self-pay | Admitting: *Deleted

## 2022-04-04 ENCOUNTER — Encounter (HOSPITAL_BASED_OUTPATIENT_CLINIC_OR_DEPARTMENT_OTHER): Payer: Self-pay

## 2022-04-04 ENCOUNTER — Encounter (HOSPITAL_COMMUNITY): Payer: Self-pay

## 2022-04-04 ENCOUNTER — Ambulatory Visit (HOSPITAL_BASED_OUTPATIENT_CLINIC_OR_DEPARTMENT_OTHER)
Admission: RE | Admit: 2022-04-04 | Discharge: 2022-04-04 | Disposition: A | Discharge status: Home / Self Care | Payer: Medicare Other | Source: Ambulatory Visit | Attending: Internal Medicine | Admitting: Internal Medicine

## 2022-04-04 DIAGNOSIS — J449 Chronic obstructive pulmonary disease, unspecified: Secondary | ICD-10-CM | POA: Diagnosis not present

## 2022-04-04 DIAGNOSIS — E1151 Type 2 diabetes mellitus with diabetic peripheral angiopathy without gangrene: Secondary | ICD-10-CM | POA: Diagnosis not present

## 2022-04-04 DIAGNOSIS — N1832 Chronic kidney disease, stage 3b: Secondary | ICD-10-CM | POA: Diagnosis not present

## 2022-04-04 DIAGNOSIS — I10 Essential (primary) hypertension: Secondary | ICD-10-CM | POA: Insufficient documentation

## 2022-04-04 DIAGNOSIS — I69351 Hemiplegia and hemiparesis following cerebral infarction affecting right dominant side: Secondary | ICD-10-CM | POA: Diagnosis not present

## 2022-04-04 DIAGNOSIS — W19XXXA Unspecified fall, initial encounter: Secondary | ICD-10-CM | POA: Insufficient documentation

## 2022-04-04 DIAGNOSIS — I5032 Chronic diastolic (congestive) heart failure: Secondary | ICD-10-CM | POA: Diagnosis not present

## 2022-04-04 DIAGNOSIS — G4452 New daily persistent headache (NDPH): Secondary | ICD-10-CM

## 2022-04-04 DIAGNOSIS — I69322 Dysarthria following cerebral infarction: Secondary | ICD-10-CM | POA: Diagnosis not present

## 2022-04-04 DIAGNOSIS — E1140 Type 2 diabetes mellitus with diabetic neuropathy, unspecified: Secondary | ICD-10-CM | POA: Diagnosis not present

## 2022-04-04 DIAGNOSIS — I872 Venous insufficiency (chronic) (peripheral): Secondary | ICD-10-CM | POA: Diagnosis not present

## 2022-04-04 DIAGNOSIS — S0990XA Unspecified injury of head, initial encounter: Secondary | ICD-10-CM | POA: Diagnosis present

## 2022-04-04 DIAGNOSIS — Z87440 Personal history of urinary (tract) infections: Secondary | ICD-10-CM | POA: Diagnosis not present

## 2022-04-04 DIAGNOSIS — C9000 Multiple myeloma not having achieved remission: Secondary | ICD-10-CM | POA: Diagnosis not present

## 2022-04-04 DIAGNOSIS — G8929 Other chronic pain: Secondary | ICD-10-CM | POA: Diagnosis not present

## 2022-04-04 DIAGNOSIS — R519 Headache, unspecified: Secondary | ICD-10-CM | POA: Diagnosis not present

## 2022-04-04 DIAGNOSIS — Z7901 Long term (current) use of anticoagulants: Secondary | ICD-10-CM | POA: Insufficient documentation

## 2022-04-04 DIAGNOSIS — I130 Hypertensive heart and chronic kidney disease with heart failure and stage 1 through stage 4 chronic kidney disease, or unspecified chronic kidney disease: Secondary | ICD-10-CM | POA: Diagnosis not present

## 2022-04-04 DIAGNOSIS — E1122 Type 2 diabetes mellitus with diabetic chronic kidney disease: Secondary | ICD-10-CM | POA: Diagnosis not present

## 2022-04-04 DIAGNOSIS — S065XAA Traumatic subdural hemorrhage with loss of consciousness status unknown, initial encounter: Secondary | ICD-10-CM | POA: Diagnosis present

## 2022-04-04 DIAGNOSIS — M160 Bilateral primary osteoarthritis of hip: Secondary | ICD-10-CM | POA: Diagnosis not present

## 2022-04-04 DIAGNOSIS — S065X0A Traumatic subdural hemorrhage without loss of consciousness, initial encounter: Secondary | ICD-10-CM | POA: Insufficient documentation

## 2022-04-04 DIAGNOSIS — L97812 Non-pressure chronic ulcer of other part of right lower leg with fat layer exposed: Secondary | ICD-10-CM | POA: Diagnosis not present

## 2022-04-04 DIAGNOSIS — R634 Abnormal weight loss: Secondary | ICD-10-CM | POA: Diagnosis not present

## 2022-04-04 DIAGNOSIS — M47812 Spondylosis without myelopathy or radiculopathy, cervical region: Secondary | ICD-10-CM | POA: Diagnosis not present

## 2022-04-04 DIAGNOSIS — E785 Hyperlipidemia, unspecified: Secondary | ICD-10-CM | POA: Diagnosis not present

## 2022-04-04 DIAGNOSIS — Z8616 Personal history of COVID-19: Secondary | ICD-10-CM | POA: Diagnosis not present

## 2022-04-04 DIAGNOSIS — I4729 Other ventricular tachycardia: Secondary | ICD-10-CM | POA: Diagnosis not present

## 2022-04-04 DIAGNOSIS — I480 Paroxysmal atrial fibrillation: Secondary | ICD-10-CM | POA: Diagnosis not present

## 2022-04-04 DIAGNOSIS — K5730 Diverticulosis of large intestine without perforation or abscess without bleeding: Secondary | ICD-10-CM | POA: Diagnosis not present

## 2022-04-04 HISTORY — DX: Traumatic subdural hemorrhage with loss of consciousness status unknown, initial encounter: S06.5XAA

## 2022-04-04 LAB — PROTIME-INR
INR: 1.4 — ABNORMAL HIGH (ref 0.8–1.2)
Prothrombin Time: 16.7 seconds — ABNORMAL HIGH (ref 11.4–15.2)

## 2022-04-04 LAB — BASIC METABOLIC PANEL
Anion gap: 8 (ref 5–15)
BUN: 19 mg/dL (ref 8–23)
CO2: 25 mmol/L (ref 22–32)
Calcium: 9.7 mg/dL (ref 8.9–10.3)
Chloride: 105 mmol/L (ref 98–111)
Creatinine, Ser: 1.44 mg/dL — ABNORMAL HIGH (ref 0.61–1.24)
GFR, Estimated: 51 mL/min — ABNORMAL LOW (ref >60)
Glucose, Bld: 91 mg/dL (ref 70–99)
Potassium: 3.6 mmol/L (ref 3.5–5.1)
Sodium: 138 mmol/L (ref 135–145)

## 2022-04-04 LAB — CBC WITH DIFFERENTIAL/PLATELET
Abs Immature Granulocytes: 0.02 10*3/uL (ref 0.00–0.07)
Basophils Absolute: 0 10*3/uL (ref 0.0–0.1)
Basophils Relative: 0 %
Eosinophils Absolute: 0.1 10*3/uL (ref 0.0–0.5)
Eosinophils Relative: 1 %
HCT: 44.3 % (ref 39.0–52.0)
Hemoglobin: 14 g/dL (ref 13.0–17.0)
Immature Granulocytes: 0 %
Lymphocytes Relative: 27 %
Lymphs Abs: 1.9 10*3/uL (ref 0.7–4.0)
MCH: 26.7 pg (ref 26.0–34.0)
MCHC: 31.6 g/dL (ref 30.0–36.0)
MCV: 84.5 fL (ref 80.0–100.0)
Monocytes Absolute: 0.7 10*3/uL (ref 0.1–1.0)
Monocytes Relative: 10 %
Neutro Abs: 4.3 10*3/uL (ref 1.7–7.7)
Neutrophils Relative %: 62 %
Platelets: 262 10*3/uL (ref 150–400)
RBC: 5.24 MIL/uL (ref 4.22–5.81)
RDW: 15.8 % — ABNORMAL HIGH (ref 11.5–15.5)
WBC: 7.1 10*3/uL (ref 4.0–10.5)
nRBC: 0 % (ref 0.0–0.2)

## 2022-04-04 MED ORDER — ACETAMINOPHEN 650 MG RE SUPP: 650.0000 mg | Freq: Four times a day (QID) | RECTAL | Status: DC | PRN

## 2022-04-04 MED ORDER — DIPHENHYDRAMINE HCL 25 MG PO CAPS
25.0000 mg | ORAL_CAPSULE | Freq: Once | ORAL | Status: AC
  Administered 2022-04-04: 25 mg via ORAL
  Filled 2022-04-04: qty 1

## 2022-04-04 MED ORDER — ONDANSETRON HCL 4 MG/2ML IJ SOLN: 4.0000 mg | Freq: Four times a day (QID) | INTRAMUSCULAR | Status: DC | PRN

## 2022-04-04 MED ORDER — TRAZODONE HCL 50 MG PO TABS: 25.0000 mg | ORAL_TABLET | Freq: Every evening | ORAL | Status: DC | PRN

## 2022-04-04 MED ORDER — ACETAMINOPHEN 325 MG PO TABS: 650.0000 mg | ORAL_TABLET | Freq: Four times a day (QID) | ORAL | Status: DC | PRN

## 2022-04-04 MED ORDER — METOCLOPRAMIDE HCL 5 MG/ML IJ SOLN
10.0000 mg | Freq: Once | INTRAMUSCULAR | Status: AC
  Administered 2022-04-04: 10 mg via INTRAVENOUS
  Filled 2022-04-04: qty 2

## 2022-04-04 MED ORDER — ONDANSETRON HCL 4 MG PO TABS: 4.0000 mg | ORAL_TABLET | Freq: Four times a day (QID) | ORAL | Status: DC | PRN

## 2022-04-04 NOTE — Consult Note (Signed)
Reason for Consult: SDH Referring Physician: EDP  Donavan Crow is an 76 y.o. male.   HPI:  This is a very pleasant 76 year old gentleman who apparently fell and struck the back of his head about 2 weeks ago.  There was no loss of consciousness.  He has had some headache that is gotten somewhat more constant and more progressive over the last week or so.  He saw his primary care physician 2 days ago who ordered an outpatient CT scan of the head that showed a subdural hematoma today at about noon, and he was sent to the emergency department here for evaluation.  He denies headache.  He denies visual changes.  He denies numbness tingling or weakness.  He has no change in gait though he does have a gait disturbance and a left foot drop and uses a walker for quite a long time.  His daughter tells me he had a stroke in June.  He is on Eliquis.  This for atrial fibrillation.  Past Medical History:  Diagnosis Date   Adenoma 05/2008   Alcoholism in recovery [facility])    BPH (benign prostatic hyperplasia)    Cancer [facility])    Prostate   Chronic LBP    Hip & Back -- Sees Dr. Maia Petties @ [place]   Colon polyps    Complex renal cyst 12/2010   Controlled type 2 diabetes mellitus with neuropathy ([place]) 01/2007   COPD (chronic obstructive pulmonary disease) ([place])    Diabetes ([place])    Diverticulitis of colon    ED (erectile dysfunction)    s/p Penile prosthesis (09/2010)   History of prostate cancer    Dr. Karsten Ro   History of sick sinus syndrome    reduced BB dose for Bradycardia   HLD (hyperlipidemia)    Hypertension    Impaired glucose tolerance 12/21/2013   Nephrolithiasis    Osteoarthritis of both hips    Osteoarthritis of lumbosacral spine    with Disc Disease   PAF (paroxysmal atrial fibrillation) (HCC)    No longer on Amiodarone.  Not on Anticoaguation b/c no recurrence.   Paresthesias/numbness    Bilateral LE   Peripheral neuropathy 12/21/2013    Past Surgical History:   Procedure Laterality Date   LEFT HEART CATH AND CORONARY ANGIOGRAPHY  2003   Normal Coronary Arteries.   NM MYOVIEW LTD  02/21/2020   EF 60%.  Medium sized mild severity defect in the basal inferior, mid inferior and apical inferior location-suggesting of ischemia.  Read as low-intermediate risk.  (On cardiology review this is felt to be a fixed defect either related to prior infarct versus diaphragmatic attenuation.  Felt to be low risk)   PENILE PROSTHESIS IMPLANT  06/21/2012   Procedure: PENILE PROTHESIS INFLATABLE;  Surgeon: Claybon Jabs, MD;  Location: [facility];  Service: Urology;  Laterality: N/A;  REMOVAL AND REPLACEMENT OF SOME  OF PROSTHESIS (AMS)    PENILE PROSTHESIS PLACEMENT  09/2010   PROSTATECTOMY     REMOVAL OF PENILE PROSTHESIS N/A 02/15/2018   Procedure: REMOVAL OF PENILE PROSTHESIS;  Surgeon: Kathie Rhodes, MD;  [facility];  Service: Urology;  Laterality: N/A;   TRANSTHORACIC ECHOCARDIOGRAM  04/2016   a) Relatively normal EF of 55-60%. No RWMA suggesting no prior MI. Gr 2 DD (pseudo-normal filling pattern) along with moderately dilated left atrium.;; b) 02/2018: Mod LVH. EF 65-70%. No RWMA.  Unable to assess DF 2/2 Afib. Mild MR. Severe BiAtrial Enlargement. High CVP.  TRANSTHORACIC ECHOCARDIOGRAM  02/17/2020   EF 60 to 65%.  No R WMA.  Unable to assess diastolic parameters because of A. fib.  Mildly elevated PA pressures.  Mild LA dilation.  Mild aortic valve sclerosis but no stenosis.  Normal IVC.    Allergies  Allergen Reactions   Ciprofloxacin Other (See Comments)    All over weakness    Social History   Tobacco Use   Smoking status: Former    Types: Cigarettes    Quit date: 11/04/1982    Years since quitting: 39.4   Smokeless tobacco: Never  Substance Use Topics   Alcohol use: Yes    Comment: no alcohol x 1 week wants to stop    Family History  Problem Relation Age of Onset   Coronary artery disease Mother    Heart attack Mother     Coronary artery disease Father    Prostate cancer Father    Diabetes Father      Review of Systems  Positive ROS: neg  All other systems have been reviewed and were otherwise negative with the exception of those mentioned in the HPI and as above.  Objective: Vital signs in last 24 hours: Temp:  [98.8 F (37.1 C)] 98.8 F (37.1 C) (09/15 1256) Pulse Rate:  [42-57] 47 (09/15 1630) Resp:  [13-31] 16 (09/15 1630) BP: (156-207)/(60-81) 180/76 (09/15 1630) SpO2:  [98 %-100 %] 100 % (09/15 1630)  General Appearance: Alert, cooperative, no distress, appears stated age Head: Normocephalic, without obvious abnormality, atraumatic Eyes: PERRL, conjunctiva/corneas clear, EOM's intact  Throat: benign Neck: Supple, symmetrical, trachea midline Back: Symmetric, no curvature, ROM normal, no CVA tenderness Lungs: respirations unlabored Heart: Irregular rhythm, rate 53 Abdomen: Soft, Extremities: Extremities normal, atraumatic, no cyanosis or edema Pulses: 2+ and symmetric all extremities Skin: Skin color, texture, turgor normal, no rashes or lesions  NEUROLOGIC:   Mental status: A&O x4, no aphasia, good attention span, Memory and fund of knowledge appear appropriate Motor Exam - grossly normal, normal tone and bulk Sensory Exam - grossly normal Reflexes: symmetric, no pathologic reflexes, No Hoffman's, No clonus Coordination - grossly normal Gait - not tested Balance - not tested Cranial Nerves: I: smell Not tested  II: visual acuity  OS: na    OD: na  II: visual fields Full to confrontation  II: pupils Equal, round, reactive to light  III,VII: ptosis None  III,IV,VI: extraocular muscles  Full ROM  V: mastication Normal  V: facial light touch sensation  Normal  V,VII: corneal reflex  Present  VII: facial muscle function - upper  Normal  VII: facial muscle function - lower Normal  VIII: hearing Not tested  IX: soft palate elevation  Normal  IX,X: gag reflex Present  XI:  trapezius strength  5/5  XI: sternocleidomastoid strength 5/5  XI: neck flexion strength  5/5  XII: tongue strength  Normal    Data Review Lab Results  Component Value Date   WBC 7.1 04/04/2022   HGB 14.0 04/04/2022   HCT 44.3 04/04/2022   MCV 84.5 04/04/2022   PLT 262 04/04/2022   Lab Results  Component Value Date   NA 138 04/04/2022   K 3.6 04/04/2022   CL 105 04/04/2022   CO2 25 04/04/2022   BUN 19 04/04/2022   CREATININE 1.44 (H) 04/04/2022   GLUCOSE 91 04/04/2022   Lab Results  Component Value Date   INR 1.4 (H) 04/04/2022    Radiology: CT HEAD WO CONTRAST (5MM)  Result Date:  04/04/2022 CLINICAL DATA:  Worsening headaches EXAM: CT HEAD WITHOUT CONTRAST TECHNIQUE: Contiguous axial images were obtained from the base of the skull through the vertex without intravenous contrast. RADIATION DOSE REDUCTION: This exam was performed according to the departmental dose-optimization program which includes automated exposure control, adjustment of the mA and/or kV according to patient size and/or use of iterative reconstruction technique. COMPARISON:  01/24/2022 CT head FINDINGS: Brain: There is a new mixed density right cerebral convexity subdural hemorrhage measuring up to 1.1 cm along the right parietal convexity and 1.1 cm along the right frontal convexity. No evidence of midline shift. Redemonstrated chronic left occipital lobe infarct. Advanced volume loss for age. Findings of chronic microvascular ischemic change. There is chronic mineralization of the basal ganglia bilaterally. No hydrocephalus. Vascular: No hyperdense vessel or unexpected calcification. Skull: Normal. Negative for fracture or focal lesion. Sinuses/Orbits: No acute finding. Other: None. IMPRESSION: New mixed density right cerebral convexity subdural hematoma measuring up to 1.1 cm. No evidence of midline shift. This was performed as an outpatient exam, but the patient was transported to the ED at the time of  interpretation. Electronically Signed   By: Marin Roberts M.D.   On: 04/04/2022 12:30     Assessment/Plan: Estimated body mass index is 33 kg/m as calculated from the following:   Height as of 04/02/22: '6\' 2"'$  (1.88 m).   Weight as of 04/02/22: 116.6 kg.   Very pleasant 76 year old gentleman who was seen here in emergency department with a right subacute subdural hematoma, and he is fairly asymptomatic except for headache.  He is off of Eliquis.  I explained to the daughter that he needs to remain off of Eliquis.  She understands there is a risk of stroke being off of Eliquis, but the risk of deterioration from the subdural hematoma is higher.  Has been at home on Eliquis with the subdural hematoma for days, we need to watch what it would do over time with serial imaging and therefore we are going to release him from the emergency department to be watched by his daughter who takes care of him at home, follow-up with me in the office on Tuesday with a repeat head CT.  We have talked about typical treatment including sore waiting, MMA embolization, and bur holes versus craniotomy.  Understand that this lesion could regress or progress.  They know to watch for seizures.  We will not start seizure prophylaxis at this time.  The risk is low.  To watch for any visual changes, stiff changes, numbness, weakness, other neurologic changes.  I gave her, the daughter, my phone number to call should she need me.   Eustace Moore 04/04/2022 5:05 PM

## 2022-04-04 NOTE — ED Provider Notes (Cosign Needed Addendum)
[facility] Provider Note   CSN: [id number] Arrival date & time: 04/04/22  1228     History  Chief Complaint  Patient presents with   Headache    [name] is a 76 y.o. male.   Headache  Patient is a 76 year old male with a past medical history stemming for TIA, atrial fibrillation on Eliquis, COPD, DM 2, HTN, BPH, neuropathy  Patient uses a walker primarily to ambulate.  He had a mechanical fall 03/21/2022 that he states occurred when he walked down the hallway without his walker to empty his urine canister.  He fell to the ground.  Struck the back of his head and had a lump there.  He began having intermittent headaches throughout the day states that he currently does not have a headache.  Denies any loss of consciousness nausea or vomiting with his head injury.  He is with daughter who corroborates that patient is very resistant to going to the hospital in general.  He saw his primary care physician on the 13th and had a CT scan ordered as an outpatient study that was completed today and showed subdural hematoma.       Home Medications Prior to Admission medications   Medication Sig Start Date End Date Taking? Authorizing Provider  acetaminophen (TYLENOL) 325 MG tablet Take 2 tablets (650 mg total) by mouth every 6 (six) hours as needed for mild pain or moderate pain. 02/23/20   Domenic Polite, MD  apixaban (ELIQUIS) 5 MG TABS tablet Take 1 tablet (5 mg total) by mouth 2 (two) times daily. 03/10/22   Biagio Borg, MD  Blood Glucose Monitoring Suppl (ACCU-CHEK GUIDE ME) w/Device KIT Use as directed twice per day E11.9 04/02/22   Biagio Borg, MD  carvedilol (COREG) 3.125 MG tablet Take 1 tablet (3.125 mg total) by mouth 2 (two) times daily with a meal. 03/10/22 04/09/22  Biagio Borg, MD  diltiazem (CARDIZEM LA) 120 MG 24 hr tablet Take 1 tablet (120 mg total) by mouth daily. 03/26/22   Biagio Borg, MD  furosemide (LASIX) 40 MG tablet TAKE 1  TABLET BY MOUTH DAILY .  MAY TAKE AN ADDITIONAL 1 TABLET  BY MOUTH IF NEEDED FOR SWELLING 03/10/22   Biagio Borg, MD  glucose blood (ACCU-CHEK GUIDE) test strip Use as instructed twice per day E11.9 04/02/22   Biagio Borg, MD  Lancet Devices [facility] PLUS LANCING) [place]  09/19/20   [provider]  Lancets (ONETOUCH DELICA PLUS [id number]) [place] 10/29/20   Biagio Borg, MD  polyethylene glycol (MIRALAX / GLYCOLAX) 17 g packet Take 17 g by mouth daily as needed for moderate constipation. 02/23/20   Domenic Polite, MD  potassium chloride (KLOR-CON) 10 MEQ tablet Take 1 tablet (10 mEq total) by mouth daily. 03/10/22   Biagio Borg, MD  PROAIR HFA 108 [id number] Base) MCG/ACT inhaler INHALE 2 PUFFS INTO THE  LUNGS EVERY 6 HOURS AS  NEEDED Patient taking differently: Inhale 2 puffs into the lungs every 6 (six) hours as needed for shortness of breath or wheezing. 06/01/20   Biagio Borg, MD  rosuvastatin (CRESTOR) 20 MG tablet Take 1 tablet (20 mg total) by mouth daily. 03/10/22 04/09/22  Biagio Borg, MD      Allergies    Ciprofloxacin    Review of Systems   Review of Systems  Neurological:  Positive for headaches.    Physical Exam Updated Vital Signs BP Marland Kitchen)  164/71 (BP Location: Left Arm)   Pulse (!) 57   Temp 98.8 F (37.1 C) (Oral)   Resp 20   SpO2 100%  Physical Exam Vitals and nursing note reviewed.  Constitutional:      General: He is not in acute distress. HENT:     Head: Normocephalic and atraumatic.     Nose: Nose normal.  Eyes:     General: No scleral icterus. Cardiovascular:     Rate and Rhythm: Normal rate and regular rhythm.     Pulses: Normal pulses.     Heart sounds: Normal heart sounds.  Pulmonary:     Effort: Pulmonary effort is normal. No respiratory distress.     Breath sounds: No wheezing.  Abdominal:     Palpations: Abdomen is soft.     Tenderness: There is no abdominal tenderness.  Musculoskeletal:     Cervical back: Normal range of  motion.     Right lower leg: No edema.     Left lower leg: No edema.  Skin:    General: Skin is warm and dry.     Capillary Refill: Capillary refill takes less than 2 seconds.  Neurological:     Mental Status: He is alert. Mental status is at baseline.     Comments: Alert and oriented to self, place, time and event.   Speech is fluent, clear without dysarthria or dysphasia.   Grossly symmetric strength in upper and lower extremities.  Sensation intact in upper/lower extremities    Normal finger-to-nose and feet tapping.  CN I not tested  CN II grossly intact visual fields bilaterally. Did not visualize posterior eye.  CN III, IV, VI PERRLA and EOMs intact bilaterally  CN V Intact sensation to sharp and light touch to the face  CN VII facial movements symmetric  CN VIII not tested  CN IX, X no uvula deviation, symmetric rise of soft palate  CN XI 5/5 SCM and trapezius strength bilaterally  CN XII Midline tongue protrusion, symmetric L/R movements     Psychiatric:        Mood and Affect: Mood normal.        Behavior: Behavior normal.     ED Results / Procedures / Treatments   Labs (all labs ordered are listed, but only abnormal results are displayed) Labs Reviewed  CBC WITH DIFFERENTIAL/PLATELET - Abnormal; Notable for the following components:      Result Value   RDW 15.8 (*)    All other components within normal limits  PROTIME-INR - Abnormal; Notable for the following components:   Prothrombin Time 16.7 (*)    INR 1.4 (*)    All other components within normal limits  BASIC METABOLIC PANEL    EKG None  Radiology CT HEAD WO CONTRAST (5MM)  Result Date: 04/04/2022 CLINICAL DATA:  Worsening headaches EXAM: CT HEAD WITHOUT CONTRAST TECHNIQUE: Contiguous axial images were obtained from the base of the skull through the vertex without intravenous contrast. RADIATION DOSE REDUCTION: This exam was performed according to the departmental dose-optimization program which  includes automated exposure control, adjustment of the mA and/or kV according to patient size and/or use of iterative reconstruction technique. COMPARISON:  01/24/2022 CT head FINDINGS: Brain: There is a new mixed density right cerebral convexity subdural hemorrhage measuring up to 1.1 cm along the right parietal convexity and 1.1 cm along the right frontal convexity. No evidence of midline shift. Redemonstrated chronic left occipital lobe infarct. Advanced volume loss for age. Findings of chronic microvascular  ischemic change. There is chronic mineralization of the basal ganglia bilaterally. No hydrocephalus. Vascular: No hyperdense vessel or unexpected calcification. Skull: Normal. Negative for fracture or focal lesion. Sinuses/Orbits: No acute finding. Other: None. IMPRESSION: New mixed density right cerebral convexity subdural hematoma measuring up to 1.1 cm. No evidence of midline shift. This was performed as an outpatient exam, but the patient was transported to the ED at the time of interpretation. Electronically Signed   By: Marin Roberts M.D.   On: 04/04/2022 12:30    Procedures Procedures    Medications Ordered in ED Medications - No data to display  ED Course/ Medical Decision Making/ A&P                           Medical Decision Making Amount and/or Complexity of Data Reviewed Labs: ordered. Radiology: ordered.  Risk OTC drugs. Prescription drug management. Decision regarding hospitalization.   This patient presents to the ED for concern of headaches, this involves a number of treatment options, and is a complaint that carries with it a moderate to high risk of complications and morbidity.  There is a large differential considered for headaches however at this time patient has been found to have a subdural hematoma on imaging prior to arrival in the emergency room.   Co morbidities: Discussed in HPI   Brief History:  Patient is a 76 year old male with a past medical  history stemming for TIA, atrial fibrillation on Eliquis, COPD, DM 2, HTN, BPH, neuropathy  Patient uses a walker primarily to ambulate.  He had a mechanical fall 03/21/2022 that he states occurred when he walked down the hallway without his walker to empty his urine canister.  He fell to the ground.  Struck the back of his head and had a lump there.  He began having intermittent headaches throughout the day states that he currently does not have a headache.  Denies any loss of consciousness nausea or vomiting with his head injury.  He is with daughter who corroborates that patient is very resistant to going to the hospital in general.  He saw his primary care physician on the 13th and had a CT scan ordered as an outpatient study that was completed today and showed subdural hematoma.     EMR reviewed including pt PMHx, past surgical history and past visits to ER.   See HPI for more details   Lab Tests:   I ordered and independently interpreted labs. Labs notable for INR that is marginally above normal at 1.4.  CBC unremarkable BMP creatinine at baseline.  No electrolyte abnormalities   Imaging Studies:  Abnormal findings. I personally reviewed all imaging studies. Imaging notable for New mixed density right cerebral convexity subdural hematoma measuring up to 1.1 cm. No evidence of midline shift.   This was performed as an outpatient exam, but the patient was transported to the ED at the time of interpretation.    Cardiac Monitoring:  The patient was maintained on a cardiac monitor.  I personally viewed and interpreted the cardiac monitored which showed an underlying rhythm of: Appears to be NSR NA   Medicines ordered:  Currently no symptoms.   Critical Interventions:  Consultation with neurosurgery   Consults/Attending Physician   I requested consultation with NP Meyran,  and discussed lab and imaging findings as well as pertinent plan - they recommend: admission to  hospitalist service at [facility] and NS will see in consultation. Hold Eliquis  Discussed with Dr. Tamala Julian who will admit to [facility]  Reevaluation:  After the interventions noted above I re-evaluated patient and found that they have :stayed the same   Social Determinants of Health:      Problem List / ED Course:  Fall 9/1 found to have a subdural hematoma that is somewhat age-indeterminate.  Discussed with neurosurgery NP Meyran who recommended admission.  Patient has conveyed some frustration with requiring admission.  I did inform him that [facility] may be at the [facility] facility for an extended period of time before he has a room available at [facility]. Pt agrees to admission at this time.    Dispostion:  After consideration of the diagnostic results and the patients response to treatment, I feel that the patent would benefit from admission        ADDENDUM: Dr. Ronnald Ramp of neurosurgery assessed pt and recommends DC home -- patient prefers this plan.  HOLD ELIQUIS AND FOLLOW UP W NEUROSURGERY. Dr. Tamala Julian updated that we will DC home.   Final Clinical Impression(s) / ED Diagnoses Final diagnoses:  Subdural hematoma [facility])    Rx / DC Orders ED Discharge Orders     None         Tedd Sias, [place] 04/04/22 1627    Tedd Sias, [place] 04/04/22 1739    Lennice Sites, DO 04/08/22 0700

## 2022-04-04 NOTE — ED Notes (Signed)
ED PA Wylder at bedside.

## 2022-04-04 NOTE — ED Notes (Signed)
Bed req cancel with pt placement per Zenia Resides C 17:38  AOM

## 2022-04-04 NOTE — Discharge Instructions (Signed)
Please hold off on Eliquis.  Do not take this medication until you are instructed to start this again.  Take all of your other medications as prescribed.  It is important for you to maintain with your blood pressure medication regimen.  Return to the emergency room for any new or concerning symptoms such as weakness numbness slurred speech confusion or any other new or concerning symptoms.

## 2022-04-04 NOTE — Telephone Encounter (Signed)
Want to let MD know pt had CT Head results.Marland KitchenMarland KitchenNew mixed density right cerebral convexity subdural hematoma measuring up to 1.1 cm. No evidence of midline shift. This was performed as an outpatient exam, but the patient was transported to the ED at the time of interpretation at [facility]...Johny Chess

## 2022-04-04 NOTE — ED Notes (Signed)
Neurology Dr. Ronnald Ramp examined patient and spoke with daughter.  Patient to be discharged with follow up on Tuesday with Dr. Ronnald Ramp.  Admission cancelled and discharge instructions reviewed.  Patient and family understand to return to ER with any changes in patient statues

## 2022-04-04 NOTE — ED Triage Notes (Signed)
Pt presents POV from home. Pt was sent here by his PCP for a persistent headache since falling September 1st. Pt was seen on Wednesday at Dr. Gwynn Burly office. Pt takes Eliquis twice a day.  No other associated symptoms, per daughter no change in his behavior  or daily cares. Pt always needs assist with ambulating, requires a walker. Pt did not take his walker with him to the bathroom which is what led to his fall on September 1st

## 2022-04-05 ENCOUNTER — Encounter: Payer: Self-pay | Admitting: Internal Medicine

## 2022-04-05 NOTE — Assessment & Plan Note (Signed)
BP Readings from Last 3 Encounters:  04/04/22 (!) 159/95  04/02/22 (!) 146/88  03/10/22 136/74   Uncontrolled, possibly reactive, pt to continue medical treatment coreg 3.125 bid, cardizem la 120 qd, lasix 40 qd

## 2022-04-05 NOTE — Assessment & Plan Note (Signed)
Last vitamin D Lab Results  Component Value Date   VD25OH 10.10 (L) 11/18/2021   Low, pt reminded to start oral replacement

## 2022-04-05 NOTE — Assessment & Plan Note (Signed)
Lab Results  Component Value Date   HGBA1C 7.0 (H) 03/10/2022   Stable, pt to continue current medical treatment  - diet, wt control, excercise 

## 2022-04-07 ENCOUNTER — Ambulatory Visit (HOSPITAL_COMMUNITY)
Admission: RE | Admit: 2022-04-07 | Discharge: 2022-04-07 | Disposition: A | Discharge status: Home / Self Care | Payer: Medicare Other | Source: Ambulatory Visit | Attending: Neurological Surgery | Admitting: Neurological Surgery

## 2022-04-07 ENCOUNTER — Ambulatory Visit: Payer: Self-pay | Admitting: Licensed Clinical Social Worker

## 2022-04-07 ENCOUNTER — Other Ambulatory Visit: Payer: Self-pay | Admitting: Neurological Surgery

## 2022-04-07 DIAGNOSIS — S065XAA Traumatic subdural hemorrhage with loss of consciousness status unknown, initial encounter: Secondary | ICD-10-CM | POA: Insufficient documentation

## 2022-04-07 DIAGNOSIS — I6200 Nontraumatic subdural hemorrhage, unspecified: Secondary | ICD-10-CM | POA: Diagnosis not present

## 2022-04-07 DIAGNOSIS — I672 Cerebral atherosclerosis: Secondary | ICD-10-CM | POA: Diagnosis not present

## 2022-04-07 NOTE — Patient Instructions (Signed)
    I am sorry you were unable to keep your phone appointment today.   I will call back per your request  [name], [place] [phone]

## 2022-04-07 NOTE — Patient Outreach (Signed)
  Care Coordination   Initial Visit Note   04/07/2022 Name: [name] MRN: [id number] DOB: [birth year]  [name] is a 76 y.o. year old male who sees Biagio Borg, MD for primary care. I spoke with patient's daughter.  What matters to the patients health and wellness today?  Unable to keep phone appointment today.    SDOH assessments and interventions completed:  No     Care Coordination Interventions Activated:  No  Care Coordination Interventions:  No, not indicated   Follow up plan:  requested a call back     Encounter Outcome:  Pt. Visit Completed   Casimer Lanius, [place] [phone]

## 2022-04-08 DIAGNOSIS — S065XAA Traumatic subdural hemorrhage with loss of consciousness status unknown, initial encounter: Secondary | ICD-10-CM | POA: Diagnosis not present

## 2022-04-09 DIAGNOSIS — I69351 Hemiplegia and hemiparesis following cerebral infarction affecting right dominant side: Secondary | ICD-10-CM | POA: Diagnosis not present

## 2022-04-09 DIAGNOSIS — E785 Hyperlipidemia, unspecified: Secondary | ICD-10-CM | POA: Diagnosis not present

## 2022-04-09 DIAGNOSIS — E1122 Type 2 diabetes mellitus with diabetic chronic kidney disease: Secondary | ICD-10-CM | POA: Diagnosis not present

## 2022-04-09 DIAGNOSIS — I69322 Dysarthria following cerebral infarction: Secondary | ICD-10-CM | POA: Diagnosis not present

## 2022-04-09 DIAGNOSIS — M47812 Spondylosis without myelopathy or radiculopathy, cervical region: Secondary | ICD-10-CM | POA: Diagnosis not present

## 2022-04-09 DIAGNOSIS — N1832 Chronic kidney disease, stage 3b: Secondary | ICD-10-CM | POA: Diagnosis not present

## 2022-04-09 DIAGNOSIS — I480 Paroxysmal atrial fibrillation: Secondary | ICD-10-CM | POA: Diagnosis not present

## 2022-04-09 DIAGNOSIS — Z87440 Personal history of urinary (tract) infections: Secondary | ICD-10-CM | POA: Diagnosis not present

## 2022-04-09 DIAGNOSIS — I872 Venous insufficiency (chronic) (peripheral): Secondary | ICD-10-CM | POA: Diagnosis not present

## 2022-04-09 DIAGNOSIS — Z8616 Personal history of COVID-19: Secondary | ICD-10-CM | POA: Diagnosis not present

## 2022-04-09 DIAGNOSIS — K5730 Diverticulosis of large intestine without perforation or abscess without bleeding: Secondary | ICD-10-CM | POA: Diagnosis not present

## 2022-04-09 DIAGNOSIS — L97812 Non-pressure chronic ulcer of other part of right lower leg with fat layer exposed: Secondary | ICD-10-CM | POA: Diagnosis not present

## 2022-04-09 DIAGNOSIS — E1140 Type 2 diabetes mellitus with diabetic neuropathy, unspecified: Secondary | ICD-10-CM | POA: Diagnosis not present

## 2022-04-09 DIAGNOSIS — J449 Chronic obstructive pulmonary disease, unspecified: Secondary | ICD-10-CM | POA: Diagnosis not present

## 2022-04-09 DIAGNOSIS — M160 Bilateral primary osteoarthritis of hip: Secondary | ICD-10-CM | POA: Diagnosis not present

## 2022-04-09 DIAGNOSIS — R634 Abnormal weight loss: Secondary | ICD-10-CM | POA: Diagnosis not present

## 2022-04-09 DIAGNOSIS — G8929 Other chronic pain: Secondary | ICD-10-CM | POA: Diagnosis not present

## 2022-04-09 DIAGNOSIS — E1151 Type 2 diabetes mellitus with diabetic peripheral angiopathy without gangrene: Secondary | ICD-10-CM | POA: Diagnosis not present

## 2022-04-09 DIAGNOSIS — Z7901 Long term (current) use of anticoagulants: Secondary | ICD-10-CM | POA: Diagnosis not present

## 2022-04-09 DIAGNOSIS — I5032 Chronic diastolic (congestive) heart failure: Secondary | ICD-10-CM | POA: Diagnosis not present

## 2022-04-09 DIAGNOSIS — I4729 Other ventricular tachycardia: Secondary | ICD-10-CM | POA: Diagnosis not present

## 2022-04-09 DIAGNOSIS — I130 Hypertensive heart and chronic kidney disease with heart failure and stage 1 through stage 4 chronic kidney disease, or unspecified chronic kidney disease: Secondary | ICD-10-CM | POA: Diagnosis not present

## 2022-04-09 DIAGNOSIS — C9000 Multiple myeloma not having achieved remission: Secondary | ICD-10-CM | POA: Diagnosis not present

## 2022-04-10 ENCOUNTER — Emergency Department (HOSPITAL_COMMUNITY)
Admission: EM | Admit: 2022-04-10 | Discharge: 2022-04-11 | Disposition: A | Discharge status: Home / Self Care | Payer: Medicare Other | Attending: Emergency Medicine | Admitting: Emergency Medicine

## 2022-04-10 ENCOUNTER — Emergency Department (HOSPITAL_COMMUNITY): Payer: Medicare Other

## 2022-04-10 ENCOUNTER — Other Ambulatory Visit (HOSPITAL_COMMUNITY): Payer: Self-pay | Admitting: Neurosurgery

## 2022-04-10 ENCOUNTER — Encounter (HOSPITAL_COMMUNITY): Payer: Self-pay | Admitting: Emergency Medicine

## 2022-04-10 ENCOUNTER — Other Ambulatory Visit: Payer: Self-pay

## 2022-04-10 DIAGNOSIS — S0990XA Unspecified injury of head, initial encounter: Secondary | ICD-10-CM | POA: Diagnosis present

## 2022-04-10 DIAGNOSIS — G319 Degenerative disease of nervous system, unspecified: Secondary | ICD-10-CM | POA: Diagnosis not present

## 2022-04-10 DIAGNOSIS — I10 Essential (primary) hypertension: Secondary | ICD-10-CM | POA: Insufficient documentation

## 2022-04-10 DIAGNOSIS — E871 Hypo-osmolality and hyponatremia: Secondary | ICD-10-CM | POA: Diagnosis not present

## 2022-04-10 DIAGNOSIS — Z20822 Contact with and (suspected) exposure to covid-19: Secondary | ICD-10-CM | POA: Insufficient documentation

## 2022-04-10 DIAGNOSIS — Z7901 Long term (current) use of anticoagulants: Secondary | ICD-10-CM | POA: Insufficient documentation

## 2022-04-10 DIAGNOSIS — S065XAA Traumatic subdural hemorrhage with loss of consciousness status unknown, initial encounter: Secondary | ICD-10-CM

## 2022-04-10 DIAGNOSIS — Z79899 Other long term (current) drug therapy: Secondary | ICD-10-CM | POA: Diagnosis not present

## 2022-04-10 DIAGNOSIS — E119 Type 2 diabetes mellitus without complications: Secondary | ICD-10-CM | POA: Diagnosis not present

## 2022-04-10 DIAGNOSIS — S065X0A Traumatic subdural hemorrhage without loss of consciousness, initial encounter: Secondary | ICD-10-CM | POA: Insufficient documentation

## 2022-04-10 DIAGNOSIS — I6782 Cerebral ischemia: Secondary | ICD-10-CM | POA: Diagnosis not present

## 2022-04-10 DIAGNOSIS — N289 Disorder of kidney and ureter, unspecified: Secondary | ICD-10-CM | POA: Diagnosis not present

## 2022-04-10 DIAGNOSIS — R519 Headache, unspecified: Secondary | ICD-10-CM

## 2022-04-10 DIAGNOSIS — W19XXXA Unspecified fall, initial encounter: Secondary | ICD-10-CM | POA: Insufficient documentation

## 2022-04-10 LAB — COMPREHENSIVE METABOLIC PANEL
ALT: 9 U/L (ref 0–44)
AST: 16 U/L (ref 15–41)
Albumin: 2.3 g/dL — ABNORMAL LOW (ref 3.5–5.0)
Alkaline Phosphatase: 137 U/L — ABNORMAL HIGH (ref 38–126)
Anion gap: 11 (ref 5–15)
BUN: 13 mg/dL (ref 8–23)
CO2: 23 mmol/L (ref 22–32)
Calcium: 8.9 mg/dL (ref 8.9–10.3)
Chloride: 99 mmol/L (ref 98–111)
Creatinine, Ser: 1.33 mg/dL — ABNORMAL HIGH (ref 0.61–1.24)
GFR, Estimated: 56 mL/min — ABNORMAL LOW (ref >60)
Glucose, Bld: 133 mg/dL — ABNORMAL HIGH (ref 70–99)
Potassium: 4 mmol/L (ref 3.5–5.1)
Sodium: 133 mmol/L — ABNORMAL LOW (ref 135–145)
Total Bilirubin: 0.7 mg/dL (ref 0.3–1.2)
Total Protein: 8.7 g/dL — ABNORMAL HIGH (ref 6.5–8.1)

## 2022-04-10 LAB — CBC
HCT: 41.7 % (ref 39.0–52.0)
Hemoglobin: 13.8 g/dL (ref 13.0–17.0)
MCH: 27.9 pg (ref 26.0–34.0)
MCHC: 33.1 g/dL (ref 30.0–36.0)
MCV: 84.2 fL (ref 80.0–100.0)
Platelets: 264 10*3/uL (ref 150–400)
RBC: 4.95 MIL/uL (ref 4.22–5.81)
RDW: 15.2 % (ref 11.5–15.5)
WBC: 7.6 10*3/uL (ref 4.0–10.5)
nRBC: 0 % (ref 0.0–0.2)

## 2022-04-10 LAB — DIFFERENTIAL
Abs Immature Granulocytes: 0.05 10*3/uL (ref 0.00–0.07)
Basophils Absolute: 0 10*3/uL (ref 0.0–0.1)
Basophils Relative: 0 %
Eosinophils Absolute: 0 10*3/uL (ref 0.0–0.5)
Eosinophils Relative: 0 %
Immature Granulocytes: 1 %
Lymphocytes Relative: 20 %
Lymphs Abs: 1.5 10*3/uL (ref 0.7–4.0)
Monocytes Absolute: 0.8 10*3/uL (ref 0.1–1.0)
Monocytes Relative: 10 %
Neutro Abs: 5.2 10*3/uL (ref 1.7–7.7)
Neutrophils Relative %: 69 %

## 2022-04-10 LAB — I-STAT CHEM 8, ED
BUN: 15 mg/dL (ref 8–23)
Calcium, Ion: 1.08 mmol/L — ABNORMAL LOW (ref 1.15–1.40)
Chloride: 100 mmol/L (ref 98–111)
Creatinine, Ser: 1.3 mg/dL — ABNORMAL HIGH (ref 0.61–1.24)
Glucose, Bld: 128 mg/dL — ABNORMAL HIGH (ref 70–99)
HCT: 45 % (ref 39.0–52.0)
Hemoglobin: 15.3 g/dL (ref 13.0–17.0)
Potassium: 3.9 mmol/L (ref 3.5–5.1)
Sodium: 133 mmol/L — ABNORMAL LOW (ref 135–145)
TCO2: 22 mmol/L (ref 22–32)

## 2022-04-10 LAB — RESP PANEL BY RT-PCR (FLU A&B, COVID) ARPGX2
Influenza A by PCR: NEGATIVE
Influenza B by PCR: NEGATIVE
SARS Coronavirus 2 by RT PCR: NEGATIVE

## 2022-04-10 LAB — ETHANOL: Alcohol, Ethyl (B): <10 mg/dL (ref <10)

## 2022-04-10 LAB — APTT: aPTT: 34 seconds (ref 24–36)

## 2022-04-10 LAB — PROTIME-INR
INR: 1.1 (ref 0.8–1.2)
Prothrombin Time: 14.3 seconds (ref 11.4–15.2)

## 2022-04-10 MED ORDER — PROCHLORPERAZINE EDISYLATE 10 MG/2ML IJ SOLN
10.0000 mg | Freq: Once | INTRAMUSCULAR | Status: AC
  Administered 2022-04-11: 10 mg via INTRAVENOUS
  Filled 2022-04-10: qty 2

## 2022-04-10 MED ORDER — LORAZEPAM 1 MG PO TABS: 0.5000 mg | ORAL_TABLET | Freq: Once | ORAL | Status: DC

## 2022-04-10 MED ORDER — LACTATED RINGERS IV BOLUS
500.0000 mL | Freq: Once | INTRAVENOUS | Status: AC
  Administered 2022-04-11: 500 mL via INTRAVENOUS

## 2022-04-10 MED ORDER — OXYCODONE HCL 5 MG PO TABS
5.0000 mg | ORAL_TABLET | Freq: Once | ORAL | Status: AC
  Administered 2022-04-10: 5 mg via ORAL
  Filled 2022-04-10: qty 1

## 2022-04-10 NOTE — ED Triage Notes (Signed)
Pt brought t oED by daughter for further evaluation of headache. Pt has recently experienced subdural hematoma post fall earlier this month. Daughter states that pt has had transient event of confusion as well prior to arrival.

## 2022-04-10 NOTE — ED Provider Triage Note (Signed)
  Emergency Medicine Provider Triage Evaluation Note  MRN:  [id number]  Arrival date & time: 04/10/22    Medically screening exam initiated at 10:08 PM.   CC:   Headache   HPI:  [name] is a 76 y.o. year-old male presents to the ED with chief complaint of confusion.  Patient reports recent subdural hematoma.  Was evaluated last week for the same.  Has seen neurosurgery.  Has embolization scheduled for next week.  He has been off of his anticoagulant.  Family member reports increased confusion and worsening headache today.  History provided by patient. ROS:  -As included in HPI PE:   Vitals:   04/10/22 2201  BP: (!) 164/86  Pulse: 61  Resp: 16  Temp: 97.7 F (36.5 C)  SpO2: 100%    Non-toxic appearing No respiratory distress CN 3-12 grossly intact MDM:  Based on signs and symptoms, worsening subdural is highest on my differential, followed by polypharmacy. I've ordered labs and imaging in triage to expedite lab/diagnostic workup.  Patient was informed that the remainder of the evaluation will be completed by another provider, this initial triage assessment does not replace that evaluation, and the importance of remaining in the ED until their evaluation is complete.    Montine Circle, PA-C 04/10/22 2210

## 2022-04-10 NOTE — ED Provider Notes (Signed)
[place] [facility] EMERGENCY DEPARTMENT Provider Note   CSN: [id number] Arrival date & time: 04/10/22  2150     History  Chief Complaint  Patient presents with   Headache    [name] is a 76 y.o. male.  The history is provided by the patient and a relative.  Headache He has history of hypertension, diabetes, hyperlipidemia, atrial fibrillation, stroke comes in complaining of worsening headache.  Headache is global and has been present for about the last 10 days.  He has been taking acetaminophen without relief.  Family member has noted that his balance seems to be slightly off and he was having difficulty using a urinal and just did not seem like himself.  She is concerned that he may have had a new stroke, but there is no no weakness.  He has been taking acetaminophen for his headache without any benefit.  Of note, he did have a fall with a subdural hematoma, and is scheduled for a neurosurgical procedure next week.  He has been off of his anticoagulants since the subdural hematoma.   Home Medications Prior to Admission medications   Medication Sig Start Date End Date Taking? Authorizing Provider  acetaminophen (TYLENOL) 325 MG tablet Take 2 tablets (650 mg total) by mouth every 6 (six) hours as needed for mild pain or moderate pain. 02/23/20   Domenic Polite, MD  apixaban (ELIQUIS) 5 MG TABS tablet Take 1 tablet (5 mg total) by mouth 2 (two) times daily. 03/10/22   Biagio Borg, MD  Blood Glucose Monitoring Suppl (ACCU-CHEK GUIDE ME) w/Device KIT Use as directed twice per day E11.9 04/02/22   Biagio Borg, MD  carvedilol (COREG) 3.125 MG tablet Take 1 tablet (3.125 mg total) by mouth 2 (two) times daily with a meal. 03/10/22 04/09/22  Biagio Borg, MD  diltiazem (CARDIZEM LA) 120 MG 24 hr tablet Take 1 tablet (120 mg total) by mouth daily. 03/26/22   Biagio Borg, MD  furosemide (LASIX) 40 MG tablet TAKE 1 TABLET BY MOUTH DAILY .  MAY TAKE AN ADDITIONAL 1 TABLET  BY  MOUTH IF NEEDED FOR SWELLING 03/10/22   Biagio Borg, MD  glucose blood (ACCU-CHEK GUIDE) test strip Use as instructed twice per day E11.9 04/02/22   Biagio Borg, MD  Lancet Devices [facility] PLUS LANCING) [place]  09/19/20   [provider]  Lancets (ONETOUCH DELICA PLUS [id number]) [place] 10/29/20   Biagio Borg, MD  polyethylene glycol (MIRALAX / GLYCOLAX) 17 g packet Take 17 g by mouth daily as needed for moderate constipation. 02/23/20   Domenic Polite, MD  potassium chloride (KLOR-CON) 10 MEQ tablet Take 1 tablet (10 mEq total) by mouth daily. 03/10/22   Biagio Borg, MD  PROAIR HFA [phone] Base) MCG/ACT inhaler INHALE 2 PUFFS INTO THE  LUNGS EVERY 6 HOURS AS  NEEDED Patient taking differently: Inhale 2 puffs into the lungs every 6 (six) hours as needed for shortness of breath or wheezing. 06/01/20   Biagio Borg, MD  rosuvastatin (CRESTOR) 20 MG tablet Take 1 tablet (20 mg total) by mouth daily. 03/10/22 04/09/22  Biagio Borg, MD      Allergies    Ciprofloxacin    Review of Systems   Review of Systems  Neurological:  Positive for headaches.  All other systems reviewed and are negative.   Physical Exam Updated Vital Signs BP (!) 164/86   Pulse 61   Temp  97.7 F (36.5 C) (Oral)   Resp 16   SpO2 100%  Physical Exam Vitals and nursing note reviewed.  76 year old male, resting comfortably and in no acute distress. Vital signs are significant for elevated blood pressure. Oxygen saturation is 100%, which is normal. Head is normocephalic and atraumatic. PERRLA, EOMI. Oropharynx is clear.  There is no tenderness to palpation over frontalis, temporalis muscles and no tenderness to palpation over the insertion of the paracervical muscles. Neck is nontender and supple without adenopathy or JVD. Back is nontender and there is no CVA tenderness. Lungs are clear without rales, wheezes, or rhonchi. Chest is nontender. Heart has an irregular rhythm without  murmur. Abdomen is soft, flat, nontender. Extremities have 2-3+ lymphedema, full range of motion is present. Skin is warm and dry without rash. Neurologic: Awake and alert, oriented to person and place but only partly oriented to time (knows the year but not the day or month), cranial nerves are intact, strength is 5/5 in all 4 extremities, but there is very slight right sided pronator drift.  ED Results / Procedures / Treatments   Labs (all labs ordered are listed, but only abnormal results are displayed) Labs Reviewed  COMPREHENSIVE METABOLIC PANEL - Abnormal; Notable for the following components:      Result Value   Sodium 133 (*)    Glucose, Bld 133 (*)    Creatinine, Ser 1.33 (*)    Total Protein 8.7 (*)    Albumin 2.3 (*)    Alkaline Phosphatase 137 (*)    GFR, Estimated 56 (*)    All other components within normal limits  I-STAT CHEM 8, ED - Abnormal; Notable for the following components:   Sodium 133 (*)    Creatinine, Ser 1.30 (*)    Glucose, Bld 128 (*)    Calcium, Ion 1.08 (*)    All other components within normal limits  RESP PANEL BY RT-PCR (FLU A&B, COVID) ARPGX2  ETHANOL  PROTIME-INR  APTT  CBC  DIFFERENTIAL  RAPID URINE DRUG SCREEN, HOSP PERFORMED  URINALYSIS, ROUTINE W REFLEX MICROSCOPIC    EKG EKG Interpretation  Date/Time:  Thursday April 10 2022 23:05:20 EDT Ventricular Rate:  58 PR Interval:    QRS Duration: 156 QT Interval:  447 QTC Calculation: 428 R Axis:   185 Text Interpretation: Atrial fibrillation Nonspecific intraventricular conduction delay Anterolateral infarct, age indeterminate When compared with ECG of EARLIER SAME DATE No significant change was found Confirmed by Delora Fuel ([id number]) on 04/10/2022 11:17:59 PM  Radiology MR BRAIN WO CONTRAST  Result Date: 04/11/2022 CLINICAL DATA:  Initial evaluation for neuro deficit, stroke suspected. EXAM: MRI HEAD WITHOUT CONTRAST TECHNIQUE: Multiplanar, multiecho pulse sequences of the brain  and surrounding structures were obtained without intravenous contrast. COMPARISON:  Prior CT from 04/10/2022. FINDINGS: Brain: Of examination technically limited by motion and the patient's inability to tolerate the full length of the study. Age-related cerebral atrophy. Patchy and confluent T2/FLAIR hyperintensity involving the periventricular and deep white matter, most likely related chronic microvascular ischemic disease. Chronic infarct at the left occipital lobe noted. No foci of restricted diffusion to suggest acute or recent infarction. Gray-white matter differentiation maintained. Subacute subdural hematoma overlying the right cerebral convexity again seen. This measures up to 1.7 cm in maximal diameter at the level the right frontal lobe. Mild extension along the falx and right tentorium. Mild mass effect on the subjacent right cerebral hemisphere with trace 2-3 mm right-to-left shift. Mild ventricular prominence most likely related  to underlying atrophy. No hydrocephalus or ventricular trapping. Basilar cisterns remain patent. No other visible acute or chronic intracranial blood products. No mass lesion. Vascular: Major intracranial vascular flow voids are grossly maintained at the skull base on this limited exam. Skull and upper cervical spine: Grossly within normal limits. Sinuses/Orbits: Globes and orbital soft tissues demonstrate no obvious abnormality. Scattered mucosal thickening about the ethmoidal air cells. No significant mastoid effusion. Other: None. IMPRESSION: 1. Technically limited exam due to motion and the patient's inability to tolerate the full length of the study. 2. No acute intracranial infarct. 3. Subacute subdural hematoma overlying the right cerebral convexity with mild mass effect and trace 2-3 mm right-to-left shift. 4. Age-related cerebral atrophy with chronic microvascular ischemic disease, with chronic left occipital infarct. Electronically Signed   By: Jeannine Boga  M.D.   On: 04/11/2022 03:46   CT HEAD WO CONTRAST  Result Date: 04/10/2022 CLINICAL DATA:  Recent fall with known right-sided subdural hematoma EXAM: CT HEAD WITHOUT CONTRAST TECHNIQUE: Contiguous axial images were obtained from the base of the skull through the vertex without intravenous contrast. RADIATION DOSE REDUCTION: This exam was performed according to the departmental dose-optimization program which includes automated exposure control, adjustment of the mA and/or kV according to patient size and/or use of iterative reconstruction technique. COMPARISON:  04/07/2022 FINDINGS: Brain: Persistent mixed attenuation right-sided subdural hematoma is identified. This measures approximately 1 cm in greatest thickness and is overall stable in appearance from the prior exam. No new focal hemorrhage is noted. Very mild mass effect is noted with minimal midline shift from right to left measuring approximately 2 mm. Atrophic changes are noted. Chronic white matter ischemic changes are noted as well. Vascular: No hyperdense vessel or unexpected calcification. Skull: Normal. Negative for fracture or focal lesion. Sinuses/Orbits: No acute finding. Other: None. IMPRESSION: Stable right-sided mixed attenuation subdural hematoma. No new focal abnormality is noted. Electronically Signed   By: Inez Catalina M.D.   On: 04/10/2022 22:57    Procedures Procedures  Cardiac monitor shows atrial fibrillation, per my interpretation.  Medications Ordered in ED Medications  fentaNYL (SUBLIMAZE) injection 50 mcg (has no administration in time range)  midazolam (VERSED) injection 1 mg (has no administration in time range)  dexamethasone (DECADRON) injection 10 mg (has no administration in time range)  oxyCODONE (Oxy IR/ROXICODONE) immediate release tablet 5 mg (5 mg Oral Given 04/10/22 2213)  lactated ringers bolus 500 mL (0 mLs Intravenous Stopped 04/11/22 0143)  prochlorperazine (COMPAZINE) injection 10 mg (10 mg Intravenous  Given 04/11/22 0055)  fentaNYL (SUBLIMAZE) injection 50 mcg (50 mcg Intravenous Given 04/11/22 0155)  midazolam (VERSED) injection 1 mg (1 mg Intravenous Given 04/11/22 0154)    ED Course/ Medical Decision Making/ A&P                           Medical Decision Making Amount and/or Complexity of Data Reviewed Radiology: ordered.  Risk Prescription drug management.   Worsening headache following subdural hematoma.  I have reviewed his old records, and his original fall was on September 1, found when he had a CT scan on September 15.  He is scheduled for right middle meningeal artery embolization on 9/28.  He currently shows no sign of stroke with exception of minimal right-sided pronator drift.  On review of old records, it is noted that he was admitted on 01/24/2022 with TIA which did have right-sided weakness but neurology evaluation on admission says no pronator  drift present at that time.  No referral for, I do question whether he could have had a small stroke.  CT scan showed stable right-sided mixed density subdural hematoma.  I independently viewed the images, and agree with radiologist interpretation.  I have reviewed and interpreted his electrocardiogram, and my interpretation is atrial fibrillation which is pre-existing, with right axis deviation which is new but of uncertain significance.  I have reviewed and interpreted his laboratory tests, and my interpretation is stable renal insufficiency, mild hyponatremia which is not felt to be clinically significant, hypoalbuminemia which is likely nutritional, normal CBC.  Urinalysis is pending.  Given new neurologic finding, I am ordering an MRI of the head.  I have ordered a headache cocktail for his headache.  Headache cocktail consists of lactated Ringer's solution, prochlorperazine.    MRI shows presence of subdural hematoma but no evidence of stroke or other acute process.  I have independently viewed the images, and agree with radiologist's  interpretation.  Urinalysis shows no evidence of infection.  He had excellent relief of his headache with above-noted treatment.  I have ordered a dose of dexamethasone to try to prevent a rebound headache.  Urinalysis shows no evidence of infection.  At this point, I feel he is safe for discharge, no indication for hospitalization.  I have given a prescription for prochlorperazine for use at home for his headache and should it recur.  He is scheduled for embolization of his middle meningeal artery and is to keep that appointment.  Return precautions discussed.  Final Clinical Impression(s) / ED Diagnoses Final diagnoses:  Bad headache  Subdural hematoma (HCC)  Renal insufficiency  Hyponatremia  Elevated blood pressure reading with diagnosis of hypertension    Rx / DC Orders ED Discharge Orders          Ordered    prochlorperazine (COMPAZINE) 10 MG tablet  Every 6 hours PRN        04/11/22 0220              Delora Fuel, MD 26/69/16 [phone]

## 2022-04-11 ENCOUNTER — Emergency Department (HOSPITAL_COMMUNITY): Payer: Medicare Other

## 2022-04-11 DIAGNOSIS — G319 Degenerative disease of nervous system, unspecified: Secondary | ICD-10-CM | POA: Diagnosis not present

## 2022-04-11 LAB — URINALYSIS, ROUTINE W REFLEX MICROSCOPIC
Bilirubin Urine: NEGATIVE
Glucose, UA: NEGATIVE mg/dL
Ketones, ur: NEGATIVE mg/dL
Leukocytes,Ua: NEGATIVE
Nitrite: NEGATIVE
Protein, ur: >=300 mg/dL — AB
Specific Gravity, Urine: 1.012 (ref 1.005–1.030)
pH: 6 (ref 5.0–8.0)

## 2022-04-11 LAB — RAPID URINE DRUG SCREEN, HOSP PERFORMED
Amphetamines: NOT DETECTED
Barbiturates: NOT DETECTED
Benzodiazepines: POSITIVE — AB
Cocaine: NOT DETECTED
Opiates: POSITIVE — AB
Tetrahydrocannabinol: NOT DETECTED

## 2022-04-11 MED ORDER — PROCHLORPERAZINE MALEATE 10 MG PO TABS: 10.0000 mg | ORAL_TABLET | Freq: Four times a day (QID) | ORAL | 0 refills | Status: DC | PRN

## 2022-04-11 MED ORDER — MIDAZOLAM HCL 2 MG/2ML IJ SOLN
1.0000 mg | Freq: Once | INTRAMUSCULAR | Status: AC
  Administered 2022-04-11: 1 mg via INTRAVENOUS
  Filled 2022-04-11: qty 2

## 2022-04-11 MED ORDER — DEXAMETHASONE SODIUM PHOSPHATE 10 MG/ML IJ SOLN
10.0000 mg | Freq: Once | INTRAMUSCULAR | Status: AC
  Administered 2022-04-11: 10 mg via INTRAVENOUS
  Filled 2022-04-11: qty 1

## 2022-04-11 MED ORDER — FENTANYL CITRATE PF 50 MCG/ML IJ SOSY
50.0000 ug | PREFILLED_SYRINGE | Freq: Once | INTRAMUSCULAR | Status: AC
  Administered 2022-04-11: 50 ug via INTRAVENOUS
  Filled 2022-04-11: qty 1

## 2022-04-11 NOTE — Discharge Instructions (Signed)
You may continue giving acetaminophen as needed for headache.  You may also try applying ice to the areas which are painful.  Return to the emergency department for any new or concerning symptoms.

## 2022-04-11 NOTE — ED Notes (Signed)
Pt resting in bed, no s/s of distress, denies pain and nausea, Roxanne Mins, MD aware of current BP.  Family has been at bedside, will continue to monitor

## 2022-04-12 DIAGNOSIS — I130 Hypertensive heart and chronic kidney disease with heart failure and stage 1 through stage 4 chronic kidney disease, or unspecified chronic kidney disease: Secondary | ICD-10-CM | POA: Diagnosis not present

## 2022-04-12 DIAGNOSIS — I480 Paroxysmal atrial fibrillation: Secondary | ICD-10-CM | POA: Diagnosis not present

## 2022-04-12 DIAGNOSIS — I5032 Chronic diastolic (congestive) heart failure: Secondary | ICD-10-CM | POA: Diagnosis not present

## 2022-04-12 DIAGNOSIS — I69351 Hemiplegia and hemiparesis following cerebral infarction affecting right dominant side: Secondary | ICD-10-CM | POA: Diagnosis not present

## 2022-04-12 DIAGNOSIS — I4729 Other ventricular tachycardia: Secondary | ICD-10-CM | POA: Diagnosis not present

## 2022-04-12 DIAGNOSIS — E785 Hyperlipidemia, unspecified: Secondary | ICD-10-CM | POA: Diagnosis not present

## 2022-04-12 DIAGNOSIS — Z87440 Personal history of urinary (tract) infections: Secondary | ICD-10-CM | POA: Diagnosis not present

## 2022-04-12 DIAGNOSIS — M47812 Spondylosis without myelopathy or radiculopathy, cervical region: Secondary | ICD-10-CM | POA: Diagnosis not present

## 2022-04-12 DIAGNOSIS — I872 Venous insufficiency (chronic) (peripheral): Secondary | ICD-10-CM | POA: Diagnosis not present

## 2022-04-12 DIAGNOSIS — R634 Abnormal weight loss: Secondary | ICD-10-CM | POA: Diagnosis not present

## 2022-04-12 DIAGNOSIS — I69322 Dysarthria following cerebral infarction: Secondary | ICD-10-CM | POA: Diagnosis not present

## 2022-04-12 DIAGNOSIS — M160 Bilateral primary osteoarthritis of hip: Secondary | ICD-10-CM | POA: Diagnosis not present

## 2022-04-12 DIAGNOSIS — C9000 Multiple myeloma not having achieved remission: Secondary | ICD-10-CM | POA: Diagnosis not present

## 2022-04-12 DIAGNOSIS — J449 Chronic obstructive pulmonary disease, unspecified: Secondary | ICD-10-CM | POA: Diagnosis not present

## 2022-04-12 DIAGNOSIS — L97812 Non-pressure chronic ulcer of other part of right lower leg with fat layer exposed: Secondary | ICD-10-CM | POA: Diagnosis not present

## 2022-04-12 DIAGNOSIS — E1140 Type 2 diabetes mellitus with diabetic neuropathy, unspecified: Secondary | ICD-10-CM | POA: Diagnosis not present

## 2022-04-12 DIAGNOSIS — E1122 Type 2 diabetes mellitus with diabetic chronic kidney disease: Secondary | ICD-10-CM | POA: Diagnosis not present

## 2022-04-12 DIAGNOSIS — K5730 Diverticulosis of large intestine without perforation or abscess without bleeding: Secondary | ICD-10-CM | POA: Diagnosis not present

## 2022-04-12 DIAGNOSIS — N1832 Chronic kidney disease, stage 3b: Secondary | ICD-10-CM | POA: Diagnosis not present

## 2022-04-12 DIAGNOSIS — Z7901 Long term (current) use of anticoagulants: Secondary | ICD-10-CM | POA: Diagnosis not present

## 2022-04-12 DIAGNOSIS — E1151 Type 2 diabetes mellitus with diabetic peripheral angiopathy without gangrene: Secondary | ICD-10-CM | POA: Diagnosis not present

## 2022-04-12 DIAGNOSIS — G8929 Other chronic pain: Secondary | ICD-10-CM | POA: Diagnosis not present

## 2022-04-12 DIAGNOSIS — Z8616 Personal history of COVID-19: Secondary | ICD-10-CM | POA: Diagnosis not present

## 2022-04-14 ENCOUNTER — Other Ambulatory Visit: Payer: Self-pay | Admitting: Neurosurgery

## 2022-04-15 DIAGNOSIS — L97812 Non-pressure chronic ulcer of other part of right lower leg with fat layer exposed: Secondary | ICD-10-CM | POA: Diagnosis not present

## 2022-04-15 DIAGNOSIS — I5032 Chronic diastolic (congestive) heart failure: Secondary | ICD-10-CM | POA: Diagnosis not present

## 2022-04-15 DIAGNOSIS — G8929 Other chronic pain: Secondary | ICD-10-CM | POA: Diagnosis not present

## 2022-04-15 DIAGNOSIS — Z8616 Personal history of COVID-19: Secondary | ICD-10-CM | POA: Diagnosis not present

## 2022-04-15 DIAGNOSIS — N1832 Chronic kidney disease, stage 3b: Secondary | ICD-10-CM | POA: Diagnosis not present

## 2022-04-15 DIAGNOSIS — I4729 Other ventricular tachycardia: Secondary | ICD-10-CM | POA: Diagnosis not present

## 2022-04-15 DIAGNOSIS — M160 Bilateral primary osteoarthritis of hip: Secondary | ICD-10-CM | POA: Diagnosis not present

## 2022-04-15 DIAGNOSIS — I480 Paroxysmal atrial fibrillation: Secondary | ICD-10-CM | POA: Diagnosis not present

## 2022-04-15 DIAGNOSIS — I130 Hypertensive heart and chronic kidney disease with heart failure and stage 1 through stage 4 chronic kidney disease, or unspecified chronic kidney disease: Secondary | ICD-10-CM | POA: Diagnosis not present

## 2022-04-15 DIAGNOSIS — I69322 Dysarthria following cerebral infarction: Secondary | ICD-10-CM | POA: Diagnosis not present

## 2022-04-15 DIAGNOSIS — C9000 Multiple myeloma not having achieved remission: Secondary | ICD-10-CM | POA: Diagnosis not present

## 2022-04-15 DIAGNOSIS — I69351 Hemiplegia and hemiparesis following cerebral infarction affecting right dominant side: Secondary | ICD-10-CM | POA: Diagnosis not present

## 2022-04-15 DIAGNOSIS — Z7901 Long term (current) use of anticoagulants: Secondary | ICD-10-CM | POA: Diagnosis not present

## 2022-04-15 DIAGNOSIS — J449 Chronic obstructive pulmonary disease, unspecified: Secondary | ICD-10-CM | POA: Diagnosis not present

## 2022-04-15 DIAGNOSIS — I872 Venous insufficiency (chronic) (peripheral): Secondary | ICD-10-CM | POA: Diagnosis not present

## 2022-04-15 DIAGNOSIS — R634 Abnormal weight loss: Secondary | ICD-10-CM | POA: Diagnosis not present

## 2022-04-15 DIAGNOSIS — Z87440 Personal history of urinary (tract) infections: Secondary | ICD-10-CM | POA: Diagnosis not present

## 2022-04-15 DIAGNOSIS — E1122 Type 2 diabetes mellitus with diabetic chronic kidney disease: Secondary | ICD-10-CM | POA: Diagnosis not present

## 2022-04-15 DIAGNOSIS — E1140 Type 2 diabetes mellitus with diabetic neuropathy, unspecified: Secondary | ICD-10-CM | POA: Diagnosis not present

## 2022-04-15 DIAGNOSIS — E785 Hyperlipidemia, unspecified: Secondary | ICD-10-CM | POA: Diagnosis not present

## 2022-04-15 DIAGNOSIS — M47812 Spondylosis without myelopathy or radiculopathy, cervical region: Secondary | ICD-10-CM | POA: Diagnosis not present

## 2022-04-15 DIAGNOSIS — K5730 Diverticulosis of large intestine without perforation or abscess without bleeding: Secondary | ICD-10-CM | POA: Diagnosis not present

## 2022-04-15 DIAGNOSIS — E1151 Type 2 diabetes mellitus with diabetic peripheral angiopathy without gangrene: Secondary | ICD-10-CM | POA: Diagnosis not present

## 2022-04-16 ENCOUNTER — Other Ambulatory Visit: Payer: Self-pay | Admitting: Student

## 2022-04-16 ENCOUNTER — Other Ambulatory Visit: Payer: Self-pay

## 2022-04-16 ENCOUNTER — Encounter (HOSPITAL_COMMUNITY): Payer: Self-pay | Admitting: Neurosurgery

## 2022-04-16 DIAGNOSIS — C9000 Multiple myeloma not having achieved remission: Secondary | ICD-10-CM | POA: Diagnosis not present

## 2022-04-16 DIAGNOSIS — M47812 Spondylosis without myelopathy or radiculopathy, cervical region: Secondary | ICD-10-CM | POA: Diagnosis not present

## 2022-04-16 DIAGNOSIS — J449 Chronic obstructive pulmonary disease, unspecified: Secondary | ICD-10-CM | POA: Diagnosis not present

## 2022-04-16 DIAGNOSIS — Z87440 Personal history of urinary (tract) infections: Secondary | ICD-10-CM | POA: Diagnosis not present

## 2022-04-16 DIAGNOSIS — R634 Abnormal weight loss: Secondary | ICD-10-CM | POA: Diagnosis not present

## 2022-04-16 DIAGNOSIS — I4729 Other ventricular tachycardia: Secondary | ICD-10-CM | POA: Diagnosis not present

## 2022-04-16 DIAGNOSIS — G8929 Other chronic pain: Secondary | ICD-10-CM | POA: Diagnosis not present

## 2022-04-16 DIAGNOSIS — E1122 Type 2 diabetes mellitus with diabetic chronic kidney disease: Secondary | ICD-10-CM | POA: Diagnosis not present

## 2022-04-16 DIAGNOSIS — Z8616 Personal history of COVID-19: Secondary | ICD-10-CM | POA: Diagnosis not present

## 2022-04-16 DIAGNOSIS — N1832 Chronic kidney disease, stage 3b: Secondary | ICD-10-CM | POA: Diagnosis not present

## 2022-04-16 DIAGNOSIS — E1151 Type 2 diabetes mellitus with diabetic peripheral angiopathy without gangrene: Secondary | ICD-10-CM | POA: Diagnosis not present

## 2022-04-16 DIAGNOSIS — E1140 Type 2 diabetes mellitus with diabetic neuropathy, unspecified: Secondary | ICD-10-CM | POA: Diagnosis not present

## 2022-04-16 DIAGNOSIS — E785 Hyperlipidemia, unspecified: Secondary | ICD-10-CM | POA: Diagnosis not present

## 2022-04-16 DIAGNOSIS — Z7901 Long term (current) use of anticoagulants: Secondary | ICD-10-CM | POA: Diagnosis not present

## 2022-04-16 DIAGNOSIS — K5730 Diverticulosis of large intestine without perforation or abscess without bleeding: Secondary | ICD-10-CM | POA: Diagnosis not present

## 2022-04-16 DIAGNOSIS — I130 Hypertensive heart and chronic kidney disease with heart failure and stage 1 through stage 4 chronic kidney disease, or unspecified chronic kidney disease: Secondary | ICD-10-CM | POA: Diagnosis not present

## 2022-04-16 DIAGNOSIS — I5032 Chronic diastolic (congestive) heart failure: Secondary | ICD-10-CM | POA: Diagnosis not present

## 2022-04-16 DIAGNOSIS — L97812 Non-pressure chronic ulcer of other part of right lower leg with fat layer exposed: Secondary | ICD-10-CM | POA: Diagnosis not present

## 2022-04-16 DIAGNOSIS — I69351 Hemiplegia and hemiparesis following cerebral infarction affecting right dominant side: Secondary | ICD-10-CM | POA: Diagnosis not present

## 2022-04-16 DIAGNOSIS — I69322 Dysarthria following cerebral infarction: Secondary | ICD-10-CM | POA: Diagnosis not present

## 2022-04-16 DIAGNOSIS — I480 Paroxysmal atrial fibrillation: Secondary | ICD-10-CM | POA: Diagnosis not present

## 2022-04-16 DIAGNOSIS — M160 Bilateral primary osteoarthritis of hip: Secondary | ICD-10-CM | POA: Diagnosis not present

## 2022-04-16 DIAGNOSIS — I872 Venous insufficiency (chronic) (peripheral): Secondary | ICD-10-CM | POA: Diagnosis not present

## 2022-04-16 NOTE — Anesthesia Preprocedure Evaluation (Signed)
Anesthesia Evaluation  Patient identified by MRN, date of birth, ID band Patient awake    Reviewed: Allergy & Precautions, H&P , NPO status , Patient's Chart, lab work & pertinent test results  Airway Mallampati: II   Neck ROM: full    Dental   Pulmonary COPD, former smoker,    breath sounds clear to auscultation       Cardiovascular hypertension,  Rhythm:regular Rate:Normal  TTE (01/2022): EF 70%   Neuro/Psych  Headaches, PSYCHIATRIC DISORDERS Anxiety CVA, Residual Symptoms    GI/Hepatic   Endo/Other  diabetes, Type 2  Renal/GU Renal InsufficiencyRenal disease     Musculoskeletal  (+) Arthritis ,   Abdominal   Peds  Hematology   Anesthesia Other Findings   Reproductive/Obstetrics                            Anesthesia Physical Anesthesia Plan  ASA: 3  Anesthesia Plan: General   Post-op Pain Management:    Induction: Intravenous  PONV Risk Score and Plan: 2 and Ondansetron, Dexamethasone, Midazolam and Treatment may vary due to age or medical condition  Airway Management Planned: Oral ETT  Additional Equipment: Arterial line  Intra-op Plan:   Post-operative Plan: Extubation in OR  Informed Consent: I have reviewed the patients History and Physical, chart, labs and discussed the procedure including the risks, benefits and alternatives for the proposed anesthesia with the patient or authorized representative who has indicated his/her understanding and acceptance.     Dental advisory given  Plan Discussed with: CRNA, Anesthesiologist and Surgeon  Anesthesia Plan Comments: (PAT note written 04/16/2022 by Myra Gianotti, PA-C. )       Anesthesia Quick Evaluation

## 2022-04-16 NOTE — Progress Notes (Signed)
Anesthesia Chart Review: SAME DAY WORK-UP  Case: [id number] Date/Time: 04/17/22 1431   Procedure: Right middle meningeal artery embolization   Anesthesia type: General   Pre-op diagnosis: S06.5XAA Subdural hematoma   Location: [facility] OR ROOM 21 / [place] [facility]   Surgeons: Consuella Lose, MD       DISCUSSION: Patient is a 76 year old male scheduled for the above procedure. Diagnosed with a SDH on 04/04/22 following a fall 2 weeks prior. Eliquis (on for afib) held. Subsequent imaging showed some progression.    History includes former smoker (quit 11/04/82), COPD, HTN, HLD, DM2, PAF (diagnosed at least since 2012), bradycardia, NSVT (01/2020)CVA (left occipital CVA ~ 2018; ), CKD (with AKI s/p CRRT x 2 02/2020), venous insufficiency, chronic low back pain, peripheral neuropathy, prostate cancer (s/p robotic assisted laparoscopic radical prostatectomy 2/29/2007; penile prosthesis 10/15/10-02/15/18), BPH, recovering alcoholic.  [place] admission 01/24/22-01/30/22 for transient dysarthria and right sided weakness. He had a fall without know injury the day prior. Deficits improved in ED. Head CT negative for acute findings. CTA did not show any LVO event. He could not tolerate a MRI even after Ativan. Received Rocephin x 5 days for suspected UTI based on UA results. He also tested positive for COVID-19 with some coughing. CXR negative. Treated with molnupiravir. Neurology consulted.  Echo shows 38 to 75% ejection fraction with no wall motion normality and no PFO. EEG showed diffuse slowing, no seizure. Thought likely had TIA and also with encephalopathy due to COVID, UTI, deconditioning. Continue Eliquis (already on for PAF). PT/OT recommended SNF.   ED visit 04/04/22 for progressive headache. He had fallen and hit his head about two weeks prior on 03/21/22. No LOC or N/V. PCP had ordered an out-patient head CT and after 04/04/22 results showed right SDH, no midline shift. Neurosurgeon Dr. Ronnald Ramp consulted. Eliquis was held.  He was started on seizure prophylaxis. Out-patient head CT was stable on 04/07/22. He presented back to the ED on 04/10/22. A MRI brain was done and showed subacute SDH with mild mass effect and trace 2-3 mm right-to- left shift.   He has had progression in SDH size this month and is now scheduled for the above procedure.Marland Kitchen  He is a same day work-up, so anesthesia team to evaluate on the day of surgery.    VS:  BP Readings from Last 3 Encounters:  04/11/22 (!) 182/105  04/04/22 (!) 159/95  04/02/22 (!) 146/88   Pulse Readings from Last 3 Encounters:  04/11/22 (!) 57  04/04/22 (!) 51  04/02/22 82     PROVIDERS: Biagio Borg, MD is PCP  Glenetta Hew, MD is cardiologist. Last visit 08/23/20.  Next visit scheduled for 06/03/2022. Corliss Parish, MD nephrologist [phone]) Rosalin Hawking, MD is neurologist (in-patient 01/2022) Kathie Rhodes, MD is urologist   LABS: Labs as indicated on arrival for surgery.  Lab results in [facility] as of 04/10/22 include: Lab Results  Component Value Date   WBC 7.6 04/10/2022   HGB 15.3 04/10/2022   HCT 45.0 04/10/2022   PLT 264 04/10/2022   GLUCOSE 128 (H) 04/10/2022   ALT 9 04/10/2022   AST 16 04/10/2022   NA 133 (L) 04/10/2022   K 3.9 04/10/2022   CL 100 04/10/2022   CREATININE 1.30 (H) 04/10/2022   BUN 15 04/10/2022   CO2 23 04/10/2022   TSH 1.363 01/24/2022   PSA 0.00 (L) 11/18/2021   INR 1.1 04/10/2022   HGBA1C 7.0 (H) 03/10/2022    IMAGES: MRI  Brain 04/11/22: IMPRESSION: 1. Technically limited exam due to motion and the patient's inability to tolerate the full length of the study. 2. No acute intracranial infarct. 3. Subacute subdural hematoma overlying the right cerebral convexity with mild mass effect and trace 2-3 mm right-to-left shift. 4. Age-related cerebral atrophy with chronic microvascular ischemic disease, with chronic left occipital infarct.   CT Head/CTA Head/Neck 01/25/22: IMPRESSION: 1. No emergent finding. 2.  Atherosclerosis without significant stenosis of major arteries in the head and neck. 3. Nondiagnostic CT perfusion due to motion. No evidence of superimposed large territory deficit.   1V PCXR 01/24/22: IMPRESSION: Low volume film without acute cardiopulmonary findings.    EKG: 04/10/22: Atrial fibrillation at 58 bpm Nonspecific intraventricular conduction delay Anterolateral infarct, age indeterminate When compared with ECG of EARLIER SAME DATE No significant change was found Confirmed by Delora Fuel ([id number]) on 04/10/2022 11:17:59 PM   CV: Echo 01/25/22: IMPRESSIONS   1. Left ventricular ejection fraction, by estimation, is 70 to 75%. The  left ventricle has hyperdynamic function. The left ventricle has no  regional wall motion abnormalities. There is mild left ventricular  hypertrophy. Left ventricular diastolic  parameters are indeterminate.   2. Right ventricular systolic function is normal. The right ventricular  size is mildly enlarged. There is normal pulmonary artery systolic  pressure. The estimated right ventricular systolic pressure is 36.1 mmHg.   3. Left atrial size was severely dilated.   4. Right atrial size was moderately dilated.   5. The mitral valve is normal in structure. No evidence of mitral valve  regurgitation. No evidence of mitral stenosis.   6. The aortic valve is normal in structure. Aortic valve regurgitation is  not visualized. No aortic stenosis is present.   7. Aortic dilatation noted. There is mild dilatation of the aortic root,  measuring 42 mm. There is mild dilatation of the ascending aorta,  measuring 41 mm.   8. The inferior vena cava is dilated in size with >50% respiratory  variability, suggesting right atrial pressure of 8 mmHg.  - Comparison(s): Prior images reviewed side by side.  - Conclusion(s)/Recommendation(s): No intracardiac source of embolism  detected on this transthoracic study. Consider a transesophageal  echocardiogram to  exclude cardiac source of embolism if clinically  indicated.    Nuclear stress test 02/22/20 (results as outlined by Fransico Him, MD): " 04/17/16: " I have personally reviewed nuclear images.  Patient has a fixed basal inferior defect and basal inferolateral defect likely related to diaphragmatic attenuation artifact.  This is a low risk scan."   Cardiac cath 09/23/2001: IMPRESSION: 1. Hyperdynamic left ventricular function with concentric left ventricular    hypertrophy without wall motion abnormalities. 2. Essentially normal coronary arteries with evidence for mild mid systolic    muscle bridging of approximately 20% in the mid left anterior descending. 3. Normal aortoiliac system.  Past Medical History:  Diagnosis Date   Adenoma 05/2008   Alcoholism in recovery [facility])    BPH (benign prostatic hyperplasia)    Cancer [facility])    Prostate   Chronic LBP    Hip & Back -- Sees Dr. Maia Petties @ [place]   CKD (chronic kidney disease)    with AKI requiring brief CRRT 02/2020   Colon polyps    Complex renal cyst 12/2010   Controlled type 2 diabetes mellitus with neuropathy ([place]) 01/2007   COPD (chronic obstructive pulmonary disease) ([place])    Diabetes (HCC)    Diverticulitis of colon  ED (erectile dysfunction)    s/p Penile prosthesis (09/2010)   History of prostate cancer    Dr. Karsten Ro   History of sick sinus syndrome    reduced BB dose for Bradycardia   HLD (hyperlipidemia)    Hypertension    Impaired glucose tolerance 12/21/2013   Nephrolithiasis    Osteoarthritis of both hips    Osteoarthritis of lumbosacral spine    with Disc Disease   PAF (paroxysmal atrial fibrillation) (HCC)    No longer on Amiodarone.  Not on Anticoaguation b/c no recurrence.   Paresthesias/numbness    Bilateral LE   Peripheral neuropathy 12/21/2013   Stroke [facility])    left occipital CVA ~ 2018; possible TIA 01/24/22   Venous insufficiency     Past Surgical History:  Procedure Laterality  Date   LEFT HEART CATH AND CORONARY ANGIOGRAPHY  2003   Normal Coronary Arteries.   NM MYOVIEW LTD  02/21/2020   EF 60%.  Medium sized mild severity defect in the basal inferior, mid inferior and apical inferior location-suggesting of ischemia.  Read as low-intermediate risk.  (On cardiology review this is felt to be a fixed defect either related to prior infarct versus diaphragmatic attenuation.  Felt to be low risk)   PENILE PROSTHESIS IMPLANT  06/21/2012   Procedure: PENILE PROTHESIS INFLATABLE;  Surgeon: Claybon Jabs, MD;  Location: [facility];  Service: Urology;  Laterality: N/A;  REMOVAL AND REPLACEMENT OF SOME  OF PROSTHESIS (AMS)    PENILE PROSTHESIS PLACEMENT  09/2010   PROSTATECTOMY     REMOVAL OF PENILE PROSTHESIS N/A 02/15/2018   Procedure: REMOVAL OF PENILE PROSTHESIS;  Surgeon: Kathie Rhodes, MD;  Location: [facility];  Service: Urology;  Laterality: N/A;   TRANSTHORACIC ECHOCARDIOGRAM  04/2016   a) Relatively normal EF of 55-60%. No RWMA suggesting no prior MI. Gr 2 DD (pseudo-normal filling pattern) along with moderately dilated left atrium.;; b) 02/2018: Mod LVH. EF 65-70%. No RWMA.  Unable to assess DF 2/2 Afib. Mild MR. Severe BiAtrial Enlargement. High CVP.   TRANSTHORACIC ECHOCARDIOGRAM  02/17/2020   EF 60 to 65%.  No R WMA.  Unable to assess diastolic parameters because of A. fib.  Mildly elevated PA pressures.  Mild LA dilation.  Mild aortic valve sclerosis but no stenosis.  Normal IVC.    MEDICATIONS: No current facility-administered medications for this encounter.    acetaminophen (TYLENOL) 500 MG tablet   acetaminophen (TYLENOL) 650 MG CR tablet   carvedilol (COREG) 3.125 MG tablet   diltiazem (CARDIZEM LA) 120 MG 24 hr tablet   furosemide (LASIX) 40 MG tablet   potassium chloride (KLOR-CON) 10 MEQ tablet   PROAIR HFA 108 (90 Base) MCG/ACT inhaler   prochlorperazine (COMPAZINE) 10 MG tablet   rosuvastatin (CRESTOR) 20 MG tablet   apixaban (ELIQUIS) 5  MG TABS tablet   Blood Glucose Monitoring Suppl (ACCU-CHEK GUIDE ME) w/Device KIT   glucose blood (ACCU-CHEK GUIDE) test strip   Lancet Devices [facility] DELICA PLUS LANCING) MISC   Lancets (ONETOUCH DELICA PLUS [id number]) MISC    Myra Gianotti, PA-C Surgical Short Stay/Anesthesiology [facility] Phone [phone] [facility] Phone [phone] 04/16/2022 10:36 AM

## 2022-04-16 NOTE — Progress Notes (Signed)
PCP - Dr. Cathlean Cower Cardiologist - Dr Ellyn Hack EKG - 04/11/22 Chest x-ray - 01/24/22 ECHO - 01/25/22 Cardiac Cath - 09/23/01 CPAP - Denies DM controlled with diet   Checks Blood Sugar:  Intermittently Blood Thinner Instructions: Eliquis Dr. Ronnald Ramp, neuro doctor at [facility], recommended to hold on Eliquis until further instruction. Dr. Kathyrn Sheriff to let them know when to restart Aspirin Instructions: N/A ERAS Protcol - NO COVID TEST- had covid 01/24/22  Anesthesia review: Yes cardiac history and recent ED visits and admission in July 2023  -------------  SDW INSTRUCTIONS:  Your procedure is scheduled on 04/17/22 Thursday. Please report to [facility] Main Entrance "A" at 1215 P.M., and check in at the Admitting office. Call this number if you have problems the morning of surgery: [phone]   Remember: Do not eat or drink after midnight the night before your surgery    Medications to take morning of surgery with a sip of water include: Coreg, Cardizem IF NEEDED: Tyl, Proair inhaler, Compazine  As of today, STOP taking any Aspirin (unless otherwise instructed by your surgeon), Aleve, Naproxen, Ibuprofen, Motrin, Advil, Goody's, BC's, all herbal medications, fish oil, and all vitamins.    The Morning of Surgery Do not wear jewelry Do not wear lotions, powders, colognes, or deodorant Do not bring valuables to the hospital. [facility] is not responsible for any belongings or valuables.  If you are a smoker, DO NOT Smoke 24 hours prior to surgery  Please bring cases for contacts, glasses, hearing aids, dentures or bridgework because it cannot be worn into surgery.     Please shower the NIGHT BEFORE/MORNING OF SURGERY (use antibacterial soap like DIAL soap if possible). Wear comfortable clothes the morning of surgery. Oral Hygiene is also important to reduce your risk of infection.  Remember - BRUSH YOUR TEETH THE MORNING OF SURGERY WITH YOUR REGULAR TOOTHPASTE  Patient denies shortness of  breath, fever, cough and chest pain.

## 2022-04-17 ENCOUNTER — Inpatient Hospital Stay (HOSPITAL_COMMUNITY)
Admission: RE | Admit: 2022-04-17 | Discharge: 2022-04-18 | DRG: 084 | Disposition: A | Discharge status: Home / Self Care | Payer: Medicare Other | Attending: Neurosurgery | Admitting: Neurosurgery

## 2022-04-17 ENCOUNTER — Inpatient Hospital Stay (HOSPITAL_COMMUNITY): Payer: Medicare Other | Admitting: Vascular Surgery

## 2022-04-17 ENCOUNTER — Inpatient Hospital Stay (HOSPITAL_COMMUNITY)
Admission: RE | Admit: 2022-04-17 | Discharge: 2022-04-17 | Disposition: A | Discharge status: Home / Self Care | Payer: Medicare Other | Source: Ambulatory Visit | Attending: Neurosurgery | Admitting: Neurosurgery

## 2022-04-17 ENCOUNTER — Other Ambulatory Visit: Payer: Self-pay

## 2022-04-17 ENCOUNTER — Encounter (HOSPITAL_COMMUNITY): Payer: Self-pay | Admitting: Neurosurgery

## 2022-04-17 ENCOUNTER — Encounter (HOSPITAL_COMMUNITY)
Admission: RE | Disposition: A | Discharge status: Home / Self Care | Payer: Self-pay | Source: Home / Self Care | Attending: Neurosurgery

## 2022-04-17 DIAGNOSIS — Z87891 Personal history of nicotine dependence: Secondary | ICD-10-CM

## 2022-04-17 DIAGNOSIS — G8929 Other chronic pain: Secondary | ICD-10-CM | POA: Diagnosis not present

## 2022-04-17 DIAGNOSIS — F1021 Alcohol dependence, in remission: Secondary | ICD-10-CM | POA: Diagnosis present

## 2022-04-17 DIAGNOSIS — R296 Repeated falls: Secondary | ICD-10-CM | POA: Diagnosis present

## 2022-04-17 DIAGNOSIS — I6202 Nontraumatic subacute subdural hemorrhage: Secondary | ICD-10-CM | POA: Diagnosis not present

## 2022-04-17 DIAGNOSIS — E1142 Type 2 diabetes mellitus with diabetic polyneuropathy: Secondary | ICD-10-CM | POA: Diagnosis not present

## 2022-04-17 DIAGNOSIS — Z9079 Acquired absence of other genital organ(s): Secondary | ICD-10-CM | POA: Diagnosis not present

## 2022-04-17 DIAGNOSIS — Z79899 Other long term (current) drug therapy: Secondary | ICD-10-CM

## 2022-04-17 DIAGNOSIS — I77810 Thoracic aortic ectasia: Secondary | ICD-10-CM | POA: Diagnosis not present

## 2022-04-17 DIAGNOSIS — M545 Low back pain, unspecified: Secondary | ICD-10-CM | POA: Diagnosis not present

## 2022-04-17 DIAGNOSIS — E1122 Type 2 diabetes mellitus with diabetic chronic kidney disease: Secondary | ICD-10-CM | POA: Diagnosis present

## 2022-04-17 DIAGNOSIS — I480 Paroxysmal atrial fibrillation: Secondary | ICD-10-CM | POA: Diagnosis not present

## 2022-04-17 DIAGNOSIS — Z8616 Personal history of COVID-19: Secondary | ICD-10-CM | POA: Diagnosis not present

## 2022-04-17 DIAGNOSIS — N400 Enlarged prostate without lower urinary tract symptoms: Secondary | ICD-10-CM | POA: Diagnosis present

## 2022-04-17 DIAGNOSIS — I10 Essential (primary) hypertension: Secondary | ICD-10-CM

## 2022-04-17 DIAGNOSIS — Z7901 Long term (current) use of anticoagulants: Secondary | ICD-10-CM

## 2022-04-17 DIAGNOSIS — Z8546 Personal history of malignant neoplasm of prostate: Secondary | ICD-10-CM

## 2022-04-17 DIAGNOSIS — J449 Chronic obstructive pulmonary disease, unspecified: Secondary | ICD-10-CM | POA: Diagnosis present

## 2022-04-17 DIAGNOSIS — E785 Hyperlipidemia, unspecified: Secondary | ICD-10-CM | POA: Diagnosis not present

## 2022-04-17 DIAGNOSIS — R2689 Other abnormalities of gait and mobility: Secondary | ICD-10-CM | POA: Diagnosis present

## 2022-04-17 DIAGNOSIS — Z8673 Personal history of transient ischemic attack (TIA), and cerebral infarction without residual deficits: Secondary | ICD-10-CM

## 2022-04-17 DIAGNOSIS — Z538 Procedure and treatment not carried out for other reasons: Secondary | ICD-10-CM | POA: Diagnosis not present

## 2022-04-17 DIAGNOSIS — I872 Venous insufficiency (chronic) (peripheral): Secondary | ICD-10-CM | POA: Diagnosis present

## 2022-04-17 DIAGNOSIS — S065XAA Traumatic subdural hemorrhage with loss of consciousness status unknown, initial encounter: Secondary | ICD-10-CM

## 2022-04-17 DIAGNOSIS — Z881 Allergy status to other antibiotic agents status: Secondary | ICD-10-CM

## 2022-04-17 HISTORY — PX: RADIOLOGY WITH ANESTHESIA: SHX6223

## 2022-04-17 HISTORY — PX: IR ANGIO INTRA EXTRACRAN SEL COM CAROTID INNOMINATE UNI R MOD SED: IMG5359

## 2022-04-17 HISTORY — DX: Chronic kidney disease, unspecified: N18.9

## 2022-04-17 HISTORY — DX: Cerebral infarction, unspecified: I63.9

## 2022-04-17 HISTORY — DX: Venous insufficiency (chronic) (peripheral): I87.2

## 2022-04-17 LAB — CBC
HCT: 43.1 % (ref 39.0–52.0)
Hemoglobin: 13.8 g/dL (ref 13.0–17.0)
MCH: 27.1 pg (ref 26.0–34.0)
MCHC: 32 g/dL (ref 30.0–36.0)
MCV: 84.7 fL (ref 80.0–100.0)
Platelets: 239 10*3/uL (ref 150–400)
RBC: 5.09 MIL/uL (ref 4.22–5.81)
RDW: 15.5 % (ref 11.5–15.5)
WBC: 8.5 10*3/uL (ref 4.0–10.5)
nRBC: 0 % (ref 0.0–0.2)

## 2022-04-17 LAB — CREATININE, SERUM
Creatinine, Ser: 1.4 mg/dL — ABNORMAL HIGH (ref 0.61–1.24)
GFR, Estimated: 52 mL/min — ABNORMAL LOW (ref >60)

## 2022-04-17 LAB — GLUCOSE, CAPILLARY
Glucose-Capillary: 104 mg/dL — ABNORMAL HIGH (ref 70–99)
Glucose-Capillary: 104 mg/dL — ABNORMAL HIGH (ref 70–99)
Glucose-Capillary: 77 mg/dL (ref 70–99)
Glucose-Capillary: 89 mg/dL (ref 70–99)

## 2022-04-17 SURGERY — IR WITH ANESTHESIA
Anesthesia: General

## 2022-04-17 MED ORDER — ONDANSETRON HCL 4 MG PO TABS: 4.0000 mg | ORAL_TABLET | ORAL | Status: DC | PRN

## 2022-04-17 MED ORDER — PHENYLEPHRINE 80 MCG/ML (10ML) SYRINGE FOR IV PUSH (FOR BLOOD PRESSURE SUPPORT)
PREFILLED_SYRINGE | INTRAVENOUS | Status: DC | PRN
  Administered 2022-04-17: 160 ug via INTRAVENOUS
  Administered 2022-04-17: 80 ug via INTRAVENOUS
  Administered 2022-04-17: 160 ug via INTRAVENOUS

## 2022-04-17 MED ORDER — ONDANSETRON HCL 4 MG/2ML IJ SOLN: 4.0000 mg | INTRAMUSCULAR | Status: DC | PRN

## 2022-04-17 MED ORDER — ONDANSETRON HCL 4 MG/2ML IJ SOLN: 4.0000 mg | Freq: Four times a day (QID) | INTRAMUSCULAR | Status: DC | PRN

## 2022-04-17 MED ORDER — FENTANYL CITRATE (PF) 100 MCG/2ML IJ SOLN
INTRAMUSCULAR | Status: AC
  Filled 2022-04-17: qty 2

## 2022-04-17 MED ORDER — GLYCOPYRROLATE PF 0.2 MG/ML IJ SOSY
PREFILLED_SYRINGE | INTRAMUSCULAR | Status: DC | PRN
  Administered 2022-04-17: .2 mg via INTRAVENOUS

## 2022-04-17 MED ORDER — CARVEDILOL 3.125 MG PO TABS
3.1250 mg | ORAL_TABLET | Freq: Two times a day (BID) | ORAL | Status: DC
  Administered 2022-04-18: 3.125 mg via ORAL
  Filled 2022-04-17: qty 1

## 2022-04-17 MED ORDER — PHENYLEPHRINE HCL-NACL 20-0.9 MG/250ML-% IV SOLN
INTRAVENOUS | Status: DC | PRN
  Administered 2022-04-17: 25 ug/min via INTRAVENOUS

## 2022-04-17 MED ORDER — ROCURONIUM BROMIDE 10 MG/ML (PF) SYRINGE
PREFILLED_SYRINGE | INTRAVENOUS | Status: DC | PRN
  Administered 2022-04-17: 40 mg via INTRAVENOUS

## 2022-04-17 MED ORDER — FENTANYL CITRATE (PF) 100 MCG/2ML IJ SOLN
INTRAMUSCULAR | Status: DC | PRN
  Administered 2022-04-17: 100 ug via INTRAVENOUS

## 2022-04-17 MED ORDER — SUGAMMADEX SODIUM 200 MG/2ML IV SOLN
INTRAVENOUS | Status: DC | PRN
  Administered 2022-04-17: 200 mg via INTRAVENOUS

## 2022-04-17 MED ORDER — HEPARIN SODIUM (PORCINE) 5000 UNIT/ML IJ SOLN
5000.0000 [IU] | Freq: Three times a day (TID) | INTRAMUSCULAR | Status: DC
  Administered 2022-04-17 – 2022-04-18 (×2): 5000 [IU] via SUBCUTANEOUS
  Filled 2022-04-17 (×2): qty 1

## 2022-04-17 MED ORDER — INSULIN ASPART 100 UNIT/ML IJ SOLN: 0.0000 [IU] | Freq: Three times a day (TID) | INTRAMUSCULAR | Status: DC

## 2022-04-17 MED ORDER — CHLORHEXIDINE GLUCONATE 0.12 % MT SOLN
15.0000 mL | Freq: Once | OROMUCOSAL | Status: AC
  Administered 2022-04-17: 15 mL via OROMUCOSAL
  Filled 2022-04-17: qty 15

## 2022-04-17 MED ORDER — INSULIN ASPART 100 UNIT/ML IJ SOLN: 0.0000 [IU] | INTRAMUSCULAR | Status: DC | PRN

## 2022-04-17 MED ORDER — SODIUM CHLORIDE 0.9 % IV SOLN: INTRAVENOUS | Status: DC

## 2022-04-17 MED ORDER — FENTANYL CITRATE (PF) 100 MCG/2ML IJ SOLN: 25.0000 ug | INTRAMUSCULAR | Status: DC | PRN

## 2022-04-17 MED ORDER — HEPARIN SODIUM (PORCINE) 1000 UNIT/ML IJ SOLN
INTRAMUSCULAR | Status: DC | PRN
  Administered 2022-04-17: 2000 [IU] via INTRAVENOUS

## 2022-04-17 MED ORDER — LABETALOL HCL 5 MG/ML IV SOLN: 10.0000 mg | INTRAVENOUS | Status: DC | PRN

## 2022-04-17 MED ORDER — PROCHLORPERAZINE MALEATE 10 MG PO TABS: 10.0000 mg | ORAL_TABLET | Freq: Four times a day (QID) | ORAL | Status: DC | PRN

## 2022-04-17 MED ORDER — PROPOFOL 10 MG/ML IV BOLUS
INTRAVENOUS | Status: DC | PRN
  Administered 2022-04-17: 100 mg via INTRAVENOUS
  Administered 2022-04-17: 70 mg via INTRAVENOUS

## 2022-04-17 MED ORDER — FUROSEMIDE 40 MG PO TABS
40.0000 mg | ORAL_TABLET | Freq: Every day | ORAL | Status: DC
  Administered 2022-04-17 – 2022-04-18 (×2): 40 mg via ORAL
  Filled 2022-04-17 (×2): qty 1

## 2022-04-17 MED ORDER — OXYCODONE HCL 5 MG/5ML PO SOLN: 5.0000 mg | Freq: Once | ORAL | Status: DC | PRN

## 2022-04-17 MED ORDER — SUCCINYLCHOLINE CHLORIDE 200 MG/10ML IV SOSY
PREFILLED_SYRINGE | INTRAVENOUS | Status: DC | PRN
  Administered 2022-04-17: 120 mg via INTRAVENOUS

## 2022-04-17 MED ORDER — ORAL CARE MOUTH RINSE: 15.0000 mL | Freq: Once | OROMUCOSAL | Status: AC

## 2022-04-17 MED ORDER — EPHEDRINE SULFATE-NACL 50-0.9 MG/10ML-% IV SOSY
PREFILLED_SYRINGE | INTRAVENOUS | Status: DC | PRN
  Administered 2022-04-17 (×3): 5 mg via INTRAVENOUS

## 2022-04-17 MED ORDER — ALBUTEROL SULFATE HFA 108 (90 BASE) MCG/ACT IN AERS: 2.0000 | INHALATION_SPRAY | Freq: Four times a day (QID) | RESPIRATORY_TRACT | Status: DC | PRN

## 2022-04-17 MED ORDER — OXYCODONE HCL 5 MG PO TABS: 5.0000 mg | ORAL_TABLET | Freq: Once | ORAL | Status: DC | PRN

## 2022-04-17 MED ORDER — PANTOPRAZOLE SODIUM 40 MG IV SOLR
40.0000 mg | Freq: Every day | INTRAVENOUS | Status: DC
  Administered 2022-04-17: 40 mg via INTRAVENOUS
  Filled 2022-04-17: qty 10

## 2022-04-17 MED ORDER — ROSUVASTATIN CALCIUM 20 MG PO TABS
20.0000 mg | ORAL_TABLET | Freq: Every evening | ORAL | Status: DC
  Administered 2022-04-17: 20 mg via ORAL
  Filled 2022-04-17: qty 1

## 2022-04-17 MED ORDER — IOHEXOL 300 MG/ML  SOLN
150.0000 mL | Freq: Once | INTRAMUSCULAR | Status: AC | PRN
  Administered 2022-04-17: 25 mL via INTRA_ARTERIAL

## 2022-04-17 MED ORDER — ALBUTEROL SULFATE (2.5 MG/3ML) 0.083% IN NEBU: 2.5000 mg | INHALATION_SOLUTION | Freq: Four times a day (QID) | RESPIRATORY_TRACT | Status: DC | PRN

## 2022-04-17 MED ORDER — LIDOCAINE 2% (20 MG/ML) 5 ML SYRINGE
INTRAMUSCULAR | Status: DC | PRN
  Administered 2022-04-17: 60 mg via INTRAVENOUS

## 2022-04-17 MED ORDER — HYDROCODONE-ACETAMINOPHEN 5-325 MG PO TABS: 1.0000 | ORAL_TABLET | ORAL | Status: DC | PRN

## 2022-04-17 MED ORDER — DILTIAZEM HCL ER COATED BEADS 120 MG PO CP24
120.0000 mg | ORAL_CAPSULE | Freq: Every day | ORAL | Status: DC
  Administered 2022-04-17 – 2022-04-18 (×2): 120 mg via ORAL
  Filled 2022-04-17 (×4): qty 1

## 2022-04-17 MED ORDER — POTASSIUM CHLORIDE ER 10 MEQ PO TBCR
10.0000 meq | EXTENDED_RELEASE_TABLET | Freq: Every day | ORAL | Status: DC
  Administered 2022-04-17 – 2022-04-18 (×2): 10 meq via ORAL
  Filled 2022-04-17 (×4): qty 1

## 2022-04-17 MED ORDER — PROTAMINE SULFATE 10 MG/ML IV SOLN
INTRAVENOUS | Status: DC | PRN
  Administered 2022-04-17: 20 mg via INTRAVENOUS

## 2022-04-17 MED ORDER — ACETAMINOPHEN 500 MG PO TABS
1000.0000 mg | ORAL_TABLET | Freq: Three times a day (TID) | ORAL | Status: DC | PRN
  Administered 2022-04-18 (×2): 1000 mg via ORAL
  Filled 2022-04-17 (×2): qty 2

## 2022-04-17 NOTE — H&P (Signed)
Chief Complaint  Headache  History of Present Illness  [name] is a 76 y.o. male initially seen by my partner after suffering a fall at the beginning of September while on anticoagulation for atrial fibrillation.  He was found to have a small right convexity subdural hematoma.  Patient has complained of some headaches and repeat CT scan has demonstrated progressive enlargement of the hematoma.  He does not complain of any focal weakness or new numbness tingling in the extremities.  Past Medical History   Past Medical History:  Diagnosis Date   Adenoma 05/2008   Alcoholism in recovery [facility])    BPH (benign prostatic hyperplasia)    Cancer (HCC)    Prostate   Chronic LBP    Hip & Back -- Sees Dr. Maia Petties @ [place]   CKD (chronic kidney disease)    with AKI requiring brief CRRT 02/2020   Colon polyps    Complex renal cyst 12/2010   Controlled type 2 diabetes mellitus with neuropathy ([place]) 01/2007   COPD (chronic obstructive pulmonary disease) ([place])    Diabetes ([place])    Diverticulitis of colon    ED (erectile dysfunction)    s/p Penile prosthesis (09/2010)   History of prostate cancer    Dr. Karsten Ro   History of sick sinus syndrome    reduced BB dose for Bradycardia   HLD (hyperlipidemia)    Hypertension    Impaired glucose tolerance 12/21/2013   Nephrolithiasis    Osteoarthritis of both hips    Osteoarthritis of lumbosacral spine    with Disc Disease   PAF (paroxysmal atrial fibrillation) (HCC)    No longer on Amiodarone.  Not on Anticoaguation b/c no recurrence.   Paresthesias/numbness    Bilateral LE   Peripheral neuropathy 12/21/2013   Stroke [facility])    left occipital CVA ~ 2018; possible TIA 01/24/22   Venous insufficiency     Past Surgical History   Past Surgical History:  Procedure Laterality Date   LEFT HEART CATH AND CORONARY ANGIOGRAPHY  2003   Normal Coronary Arteries.   NM MYOVIEW LTD  02/21/2020   EF 60%.  Medium sized mild  severity defect in the basal inferior, mid inferior and apical inferior location-suggesting of ischemia.  Read as low-intermediate risk.  (On cardiology review this is felt to be a fixed defect either related to prior infarct versus diaphragmatic attenuation.  Felt to be low risk)   PENILE PROSTHESIS IMPLANT  06/21/2012   Procedure: PENILE PROTHESIS INFLATABLE;  Surgeon: Claybon Jabs, MD;  Location[facility];  Service: Urology;  Laterality: N/A;  REMOVAL AND REPLACEMENT OF SOME  OF PROSTHESIS (AMS)    PENILE PROSTHESIS PLACEMENT  09/2010   PROSTATECTOMY     REMOVAL OF PENILE PROSTHESIS N/A 02/15/2018   Procedure: REMOVAL OF PENILE PROSTHESIS;  Surgeon: Kathie Rhodes, MD;  Location[facility];  Service: Urology;  Laterality: N/A;   TRANSTHORACIC ECHOCARDIOGRAM  04/2016   a) Relatively normal EF of 55-60%. No RWMA suggesting no prior MI. Gr 2 DD (pseudo-normal filling pattern) along with moderately dilated left atrium.;; b) 02/2018: Mod LVH. EF 65-70%. No RWMA.  Unable to assess DF 2/2 Afib. Mild MR. Severe BiAtrial Enlargement. High CVP.   TRANSTHORACIC ECHOCARDIOGRAM  02/17/2020   EF 60 to 65%.  No R WMA.  Unable to assess diastolic parameters because of A. fib.  Mildly elevated PA pressures.  Mild LA dilation.  Mild aortic valve sclerosis but no stenosis.  Normal IVC.    Social History   Social History   Tobacco Use   Smoking status: Former    Types: Cigarettes    Quit date: 11/04/1982    Years since quitting: 39.4   Smokeless tobacco: Never  Vaping Use   Vaping Use: Never used  Substance Use Topics   Alcohol use: Not Currently   Drug use: No    Types: Marijuana    Comment: use to smoke marijuana    Medications   Prior to Admission medications   Medication Sig Start Date End Date Taking? Authorizing Provider  acetaminophen (TYLENOL) 650 MG CR tablet Take 1,300 mg by mouth every 8 (eight) hours as needed for pain.   Yes [provider]  carvedilol (COREG)  3.125 MG tablet Take 3.125 mg by mouth 2 (two) times daily with a meal.   Yes [provider]  diltiazem (CARDIZEM LA) 120 MG 24 hr tablet Take 1 tablet (120 mg total) by mouth daily. 03/26/22  Yes Biagio Borg, MD  furosemide (LASIX) 40 MG tablet TAKE 1 TABLET BY MOUTH DAILY .  MAY TAKE AN ADDITIONAL 1 TABLET  BY MOUTH IF NEEDED FOR SWELLING 03/10/22  Yes Biagio Borg, MD  potassium chloride (KLOR-CON) 10 MEQ tablet Take 1 tablet (10 mEq total) by mouth daily. 03/10/22  Yes Biagio Borg, MD  PROAIR HFA 108 [phone] Base) MCG/ACT inhaler INHALE 2 PUFFS INTO THE  LUNGS EVERY 6 HOURS AS  NEEDED Patient taking differently: Inhale 2 puffs into the lungs every 6 (six) hours as needed for shortness of breath or wheezing. 06/01/20  Yes Biagio Borg, MD  prochlorperazine (COMPAZINE) 10 MG tablet Take 1 tablet (10 mg total) by mouth every 6 (six) hours as needed for nausea or vomiting (or headache). 01/27/61  Yes Delora Fuel, MD  rosuvastatin (CRESTOR) 20 MG tablet Take 20 mg by mouth every evening.   Yes [provider]  acetaminophen (TYLENOL) 500 MG tablet Take 1,000 mg by mouth every 8 (eight) hours as needed for headache.    [provider]  apixaban (ELIQUIS) 5 MG TABS tablet Take 1 tablet (5 mg total) by mouth 2 (two) times daily. 03/10/22   Biagio Borg, MD  Blood Glucose Monitoring Suppl (ACCU-CHEK GUIDE ME) w/Device KIT Use as directed twice per day E11.9 04/02/22   Biagio Borg, MD  glucose blood (ACCU-CHEK GUIDE) test strip Use as instructed twice per day E11.9 04/02/22   Biagio Borg, MD  Lancet Devices ([place] LANCING) [place]  09/19/20   [provider]  Lancets (ONETOUCH DELICA PLUS [id number]) [place] 10/29/20   Biagio Borg, MD    Allergies   Allergies  Allergen Reactions   Ciprofloxacin Other (See Comments)    All over weakness    Review of Systems  ROS  Neurologic Exam  Awake, alert, oriented Memory and concentration grossly  intact Speech fluent, appropriate CN grossly intact Motor exam: Upper Extremities Deltoid Bicep Tricep Grip  Right 5/5 5/5 5/5 5/5  Left 5/5 5/5 5/5 5/5   Lower Extremities IP Quad PF DF EHL  Right 5/5 5/5 5/5 5/5 5/5  Left 5/5 5/5 5/5 5/5 5/5   Sensation grossly intact to LT  Imaging  CT scan of the head dated 04/07/2022 was personally reviewed and compared to prior CT of 04/04/2022.  This demonstrates enlargement of the right frontoparietal convexity subdural hematoma.  Overall thickness of the hematoma is approximately  1 cm.  There is mild local mass effect with minimal midline shift.  Impression  - 76 y.o. male with history of atrial fibrillation and associated previous stroke previously on anticoagulation.  He has an enlarging right-sided subdural hematoma but remains largely asymptomatic with the exception of headache.  In an effort to avoid craniotomy and prolonged time.  Off of anticoagulation I do feel that endovascular middle meningeal artery embolization is a reasonable treatment option.  Plan  -We will plan on proceeding with right middle meningeal artery embolization  I have reviewed the indications for the procedure as well as the details of the procedure and the expected postoperative course and recovery with the patient and his daughter at length in the office. We have also reviewed in detail the risks, benefits, and alternatives to the procedure. All questions were answered and [name] provided informed consent to proceed.  Consuella Lose, MD [facility]

## 2022-04-17 NOTE — Anesthesia Procedure Notes (Signed)
Procedure Name: Intubation Date/Time: 04/17/2022 4:09 PM  Performed by: Albertha Ghee, MDPre-anesthesia Checklist: Patient identified, Emergency Drugs available, Suction available and Patient being monitored Patient Re-evaluated:Patient Re-evaluated prior to induction Oxygen Delivery Method: Circle System Utilized Preoxygenation: Pre-oxygenation with 100% oxygen Induction Type: IV induction and Rapid sequence Laryngoscope Size: Glidescope and 4 Grade View: Grade I Tube type: Oral Tube size: 7.5 mm Number of attempts: 1 Airway Equipment and Method: Stylet and Oral airway Placement Confirmation: ETT inserted through vocal cords under direct vision, positive ETCO2 and breath sounds checked- equal and bilateral Secured at: 23 cm Tube secured with: Tape Dental Injury: Teeth and Oropharynx as per pre-operative assessment

## 2022-04-17 NOTE — Transfer of Care (Signed)
Immediate Anesthesia Transfer of Care Note  Patient: [name]  Procedure(s) Performed: Right middle meningeal artery embolization  Patient Location: PACU  Anesthesia Type:General  Level of Consciousness: drowsy  Airway & Oxygen Therapy: Patient Spontanous Breathing  Post-op Assessment: Report given to RN and Post -op Vital signs reviewed and stable  Post vital signs: Reviewed and stable  Last Vitals:  Vitals Value Taken Time  BP 141/73 04/17/22 1732  Temp    Pulse 61 04/17/22 1734  Resp 25 04/17/22 1734  SpO2 94 % 04/17/22 1734  Vitals shown include unvalidated device data.  Last Pain:  Vitals:   04/17/22 1316  TempSrc:   PainSc: 0-No pain         Complications: No notable events documented.

## 2022-04-17 NOTE — Anesthesia Procedure Notes (Signed)
Arterial Line Insertion Start/End9/28/2023 2:10 PM Performed by: Carolan Clines, CRNA, CRNA  Patient location: Pre-op. Preanesthetic checklist: patient identified, IV checked, site marked, risks and benefits discussed, surgical consent, monitors and equipment checked, pre-op evaluation, timeout performed and anesthesia consent Lidocaine 1% used for infiltration Left, radial was placed Catheter size: 20 G Hand hygiene performed  and maximum sterile barriers used   Attempts: 1 Procedure performed without using ultrasound guided technique. Following insertion, dressing applied and Biopatch. Post procedure assessment: normal and unchanged  Patient tolerated the procedure well with no immediate complications.

## 2022-04-17 NOTE — Sedation Documentation (Addendum)
Patient assisted from stretcher to IR table with assistance from staff using the transfer board. Patient states he can not lay his head flat and that he sleeps in a recliner at home because of lower back pain. Yellow foam cushions placed behind patient head for his comfort. Patient denies any pain or discomfort after cushions place behind head. Dr. Kathyrn Sheriff made aware that patient is unable to lay his head flat.

## 2022-04-17 NOTE — Brief Op Note (Signed)
  NEUROSURGERY BRIEF OPERATIVE  NOTE   PREOP DX: Subdural Hematoma  POSTOP DX: Same  PROCEDURE: Diagnostic cerebral angiogram, attempted right MMA embolization  SURGEON: Dr. Consuella Lose, MD  ANESTHESIA: GETA  EBL: Minimal  SPECIMENS: None  COMPLICATIONS: None  CONDITION: Stable to recovery  FINDINGS (Full report in [facility]): 1. Severe cervical spondylosis precluded use of the lateral plane while the patient was noted to have likely decreased EF with increased MTT and calcific plaque at the distal right common carotid/origin of the external. Procedure was therefore aborted due to likelihood of increased risk of stroke or other significant morbidity.   Consuella Lose, MD [facility]

## 2022-04-18 ENCOUNTER — Encounter (HOSPITAL_COMMUNITY): Payer: Self-pay | Admitting: Neurosurgery

## 2022-04-18 LAB — GLUCOSE, CAPILLARY
Glucose-Capillary: 105 mg/dL — ABNORMAL HIGH (ref 70–99)
Glucose-Capillary: 99 mg/dL (ref 70–99)

## 2022-04-18 MED ORDER — PANTOPRAZOLE SODIUM 40 MG PO TBEC
40.0000 mg | DELAYED_RELEASE_TABLET | Freq: Every day | ORAL | Status: DC
  Administered 2022-04-18: 40 mg via ORAL
  Filled 2022-04-18: qty 1

## 2022-04-18 NOTE — Anesthesia Postprocedure Evaluation (Signed)
Anesthesia Post Note  Patient: [name]  Procedure(s) Performed: Right middle meningeal artery embolization     Patient location during evaluation: PACU Anesthesia Type: General Level of consciousness: awake and alert Pain management: pain level controlled Vital Signs Assessment: post-procedure vital signs reviewed and stable Respiratory status: spontaneous breathing, nonlabored ventilation, respiratory function stable and patient connected to nasal cannula oxygen Cardiovascular status: blood pressure returned to baseline and stable Postop Assessment: no apparent nausea or vomiting Anesthetic complications: no   No notable events documented.  Last Vitals:  Vitals:   04/17/22 2358 04/18/22 0413  BP: (!) 147/101 (!) 153/67  Pulse: (!) 58 60  Resp: 18 17  Temp: 36.9 C 37 C  SpO2: 100% 94%    Last Pain:  Vitals:   04/18/22 0502  TempSrc:   PainSc: [place]

## 2022-04-18 NOTE — Discharge Summary (Signed)
Physician Discharge Summary  Patient ID: [name] MRN: [id number] DOB/AGE: 12-18-1945 76 y.o.  Admit date: 04/17/2022 Discharge date: 04/18/2022  Admission Diagnoses:  Subdural hematoma  Discharge Diagnoses:  Same Principal Problem:   SDH (subdural hematoma) (HCC) Active Problems:   Subdural hematoma [facility])   Discharged Condition: Stable  Hospital Course:  [name] is a 76 y.o. male admitted after attempted right middle meningeal artery embolization for chronic subdural hematoma.  Unfortunately the procedure was aborted without completion.  The patient was at neurologic baseline and was observed overnight.  He was discharged home in stable condition.  Treatments: Attempted endovascular right middle meningeal artery embolization, unsuccessful  Discharge Exam: Blood pressure 139/72, pulse (!) 43, temperature 98.6 F (37 C), temperature source Oral, resp. rate 17, height '6\' 2"'  (1.88 m), weight 116.6 kg, SpO2 100 %. Awake, alert, oriented Speech fluent, appropriate CN grossly intact 5/5 BUE/BLE Wound c/d/i  Disposition: Discharge disposition: 01-Home or Self Care       Discharge Instructions     Call MD for:  redness, tenderness, or signs of infection (pain, swelling, redness, odor or green/yellow discharge around incision site)   Complete by: As directed    Call MD for:  temperature >100.4   Complete by: As directed    Diet - low sodium heart healthy   Complete by: As directed    Discharge instructions   Complete by: As directed    Walk at home as much as possible, at least 4 times / day   Increase activity slowly   Complete by: As directed    Lifting restrictions   Complete by: As directed    No lifting > 10 lbs   May shower / Bathe   Complete by: As directed    48 hours after surgery   May walk up steps   Complete by: As directed    No dressing needed   Complete by: As directed    Other Restrictions   Complete by: As directed    No  bending/twisting at waist      Allergies as of 04/18/2022       Reactions   Ciprofloxacin Other (See Comments)   All over weakness        Medication List     STOP taking these medications    apixaban 5 MG Tabs tablet Commonly known as: Eliquis       TAKE these medications    Accu-Chek Guide Me w/Device Kit Use as directed twice per day E11.9   Accu-Chek Guide test strip Generic drug: glucose blood Use as instructed twice per day E11.9   acetaminophen 500 MG tablet Commonly known as: TYLENOL Take 1,000 mg by mouth every 8 (eight) hours as needed for headache.   acetaminophen 650 MG CR tablet Commonly known as: TYLENOL Take 1,300 mg by mouth every 8 (eight) hours as needed for pain.   carvedilol 3.125 MG tablet Commonly known as: COREG Take 3.125 mg by mouth 2 (two) times daily with a meal.   diltiazem 120 MG 24 hr tablet Commonly known as: CARDIZEM LA Take 1 tablet (120 mg total) by mouth daily.   furosemide 40 MG tablet Commonly known as: LASIX TAKE 1 TABLET BY MOUTH DAILY .  MAY TAKE AN ADDITIONAL 1 TABLET  BY MOUTH IF NEEDED FOR SWELLING   OneTouch Delica Plus [id number] Misc USE ONCE DAILY   OneTouch Delica Plus Lancing Misc   potassium chloride 10 MEQ tablet Commonly known as: KLOR-CON Take 1  tablet (10 mEq total) by mouth daily.   ProAir HFA 108 (90 Base) MCG/ACT inhaler Generic drug: albuterol INHALE 2 PUFFS INTO THE  LUNGS EVERY 6 HOURS AS  NEEDED What changed: reasons to take this   prochlorperazine 10 MG tablet Commonly known as: COMPAZINE Take 1 tablet (10 mg total) by mouth every 6 (six) hours as needed for nausea or vomiting (or headache).   rosuvastatin 20 MG tablet Commonly known as: CRESTOR Take 20 mg by mouth every evening.               Discharge Care Instructions  (From admission, onward)           Start     Ordered   04/18/22 0000  No dressing needed        04/18/22 1210            Follow-up  Information     Eustace Moore, MD Follow up in 2 week(s).   Specialty: Neurosurgery Contact information: [address] [place] [address] [phone]                 Signed: Jairo Ben 04/18/2022, 12:11 PM

## 2022-04-18 NOTE — Progress Notes (Signed)
  NEUROSURGERY PROGRESS NOTE   No issues overnight. No new complaints  EXAM:  BP 139/72 (BP Location: Right Arm)   Pulse (!) 43   Temp 98.6 F (37 C) (Oral)   Resp 17   Ht '6\' 2"'$  (1.88 m)   Wt 116.6 kg   SpO2 100%   BMI 33.00 kg/m   Awake, alert, oriented  Speech fluent, appropriate  CN grossly intact  5/5 BUE/BLE   IMPRESSION:  76 y.o. male POD#1 after aborted attempt at right MMA embolization, at baseline. Will plan on continued imaging surveilance of SDH  PLAN: - can d/c home today - Cont current home regimen for HA - Will order outpatient f/u CT scan and f/u with Dr. Ronnald Ramp for SDH.   Consuella Lose, MD [facility]

## 2022-04-18 NOTE — TOC Transition Note (Signed)
Transition of Care [facility]) - CM/SW Discharge Note   Patient Details  Name: [name] MRN: [id number] Date of Birth: 01-23-1946  Transition of [facility]) CM/SW Contact:  Pollie Friar, RN Phone Number: 04/18/2022, 12:41 PM   Clinical Narrative:    Pt is discharging home with self care. No needs per TOC.   Final next level of care: Home/Self Care Barriers to Discharge: No Barriers Identified   Patient Goals and CMS Choice        Discharge Placement                       Discharge Plan and Services                                     Social Determinants of Health (SDOH) Interventions     Readmission Risk Interventions     No data to display

## 2022-04-18 NOTE — Addendum Note (Signed)
Addendum  created 04/18/22 1022 by Moshe Salisbury, CRNA   Intraprocedure Event edited

## 2022-04-19 ENCOUNTER — Encounter (HOSPITAL_BASED_OUTPATIENT_CLINIC_OR_DEPARTMENT_OTHER): Payer: Self-pay | Admitting: *Deleted

## 2022-04-19 ENCOUNTER — Other Ambulatory Visit: Payer: Self-pay

## 2022-04-19 ENCOUNTER — Emergency Department (HOSPITAL_BASED_OUTPATIENT_CLINIC_OR_DEPARTMENT_OTHER): Payer: Medicare Other

## 2022-04-19 DIAGNOSIS — Z79899 Other long term (current) drug therapy: Secondary | ICD-10-CM | POA: Diagnosis not present

## 2022-04-19 DIAGNOSIS — W19XXXA Unspecified fall, initial encounter: Secondary | ICD-10-CM | POA: Insufficient documentation

## 2022-04-19 DIAGNOSIS — R202 Paresthesia of skin: Secondary | ICD-10-CM | POA: Insufficient documentation

## 2022-04-19 NOTE — ED Notes (Signed)
Call to CT regarding exam, they will get him as soon as they can

## 2022-04-19 NOTE — ED Notes (Signed)
C-collar applied, fit poorly due to pt anatomy

## 2022-04-19 NOTE — ED Provider Triage Note (Signed)
Emergency Medicine Provider Triage Evaluation Note  [name] , a 76 y.o. male  was evaluated in triage.  Pt complains unwitnessed fall today.  He has a recent subdural hematoma which was inoperable at the time.  His daughter is at bedside and gives history.  She states that since he fell he has been having upper extremity numbness in both of his hands and increased lower extremity weakness..  Review of Systems  Positive: fall Negative: headqache  Physical Exam  BP (!) 159/88 (BP Location: Left Arm)   Pulse (!) 49   Temp 97.9 F (36.6 C)   Resp 16   SpO2 100%  Gen:   Awake, no distress   Resp:  Normal effort  MSK:   Moves extremities without difficulty  Other:    Medical Decision Making  Medically screening exam initiated at 8:42 PM.  Appropriate orders placed.  [name] was informed that the remainder of the evaluation will be completed by another provider, this initial triage assessment does not replace that evaluation, and the importance of remaining in the ED until their evaluation is complete.  Work up initiated.   Parker Mail, PA-C 04/19/22 2309

## 2022-04-19 NOTE — ED Triage Notes (Signed)
Pt is here after unwitnessed fall around 8am which occurred earlier today.  Pt denies hitting head.  Pt was evaluated by ems post fall and ems advised for pt to be seen in ED if he felt worse.  Pt was discharged from hospital yesterday where they attempted a procedure after subdural hematoma.  Pt is reporting bilateral arm pain with numbness and tingling. Pt has been off elaquis since 9/15.  Pt is alert and oriented. No focal neuro deficits noted in triage but daughter does explain that pt has had a generalized decline since subdural hematoma and was more weak since fall this am.  Pt does not feel that he was weaker since fall but notes that he has numbness and tingling.  EDPA here to see pt.  Pt denies any neck pain

## 2022-04-20 ENCOUNTER — Emergency Department (HOSPITAL_BASED_OUTPATIENT_CLINIC_OR_DEPARTMENT_OTHER)
Admission: EM | Admit: 2022-04-20 | Discharge: 2022-04-20 | Disposition: A | Discharge status: Home / Self Care | Payer: Medicare Other | Attending: Emergency Medicine | Admitting: Emergency Medicine

## 2022-04-20 DIAGNOSIS — M5030 Other cervical disc degeneration, unspecified cervical region: Secondary | ICD-10-CM | POA: Diagnosis not present

## 2022-04-20 DIAGNOSIS — I63532 Cerebral infarction due to unspecified occlusion or stenosis of left posterior cerebral artery: Secondary | ICD-10-CM | POA: Diagnosis not present

## 2022-04-20 DIAGNOSIS — S065XAA Traumatic subdural hemorrhage with loss of consciousness status unknown, initial encounter: Secondary | ICD-10-CM

## 2022-04-20 DIAGNOSIS — R519 Headache, unspecified: Secondary | ICD-10-CM | POA: Diagnosis not present

## 2022-04-20 DIAGNOSIS — M79601 Pain in right arm: Secondary | ICD-10-CM

## 2022-04-20 DIAGNOSIS — M542 Cervicalgia: Secondary | ICD-10-CM | POA: Diagnosis not present

## 2022-04-20 DIAGNOSIS — W19XXXA Unspecified fall, initial encounter: Secondary | ICD-10-CM

## 2022-04-20 MED ORDER — ACETAMINOPHEN 500 MG PO TABS
1000.0000 mg | ORAL_TABLET | Freq: Once | ORAL | Status: AC
  Administered 2022-04-20: 1000 mg via ORAL
  Filled 2022-04-20: qty 2

## 2022-04-20 MED ORDER — LIDOCAINE 5 % EX PTCH: 1.0000 | MEDICATED_PATCH | CUTANEOUS | 0 refills | Status: DC

## 2022-04-20 NOTE — ED Notes (Signed)
[facility] called for consult to neurosurgery.   Spoke with Otila Kluver.

## 2022-04-20 NOTE — ED Notes (Signed)
Pt given a urinal.

## 2022-04-20 NOTE — ED Notes (Signed)
Pt was placed in a [place] collar as per MD orders. Pt was given instructions for the collar as well as family.

## 2022-04-20 NOTE — ED Provider Notes (Addendum)
[facility] EMERGENCY DEPT Provider Note   CSN: [id number] Arrival date & time: 04/19/22  2018     History  Chief Complaint  Patient presents with   Fall    [name] is a 76 y.o. male.  The history is provided by the patient and a relative.  Fall This is a recurrent problem. The current episode started 6 to 12 hours ago. The problem occurs constantly. The problem has not changed since onset.Pertinent negatives include no chest pain, no abdominal pain and no shortness of breath. Associated symptoms comments: Paresthesias from B elbows down. . Nothing aggravates the symptoms. Nothing relieves the symptoms. He has tried nothing for the symptoms. The treatment provided no relief.  Patient with subdural hematoma that was inoperable who fell and has had B forearm paresthesias and pain since fall.  Patient states he is not weak in any extremities since fall.       Home Medications Prior to Admission medications   Medication Sig Start Date End Date Taking? Authorizing Provider  acetaminophen (TYLENOL) 500 MG tablet Take 1,000 mg by mouth every 8 (eight) hours as needed for headache.    [provider]  acetaminophen (TYLENOL) 650 MG CR tablet Take 1,300 mg by mouth every 8 (eight) hours as needed for pain.    [provider]  Blood Glucose Monitoring Suppl (ACCU-CHEK GUIDE ME) w/Device KIT Use as directed twice per day E11.9 04/02/22   Biagio Borg, MD  carvedilol (COREG) 3.125 MG tablet Take 3.125 mg by mouth 2 (two) times daily with a meal.    [provider]  diltiazem (CARDIZEM LA) 120 MG 24 hr tablet Take 1 tablet (120 mg total) by mouth daily. 03/26/22   Biagio Borg, MD  furosemide (LASIX) 40 MG tablet TAKE 1 TABLET BY MOUTH DAILY .  MAY TAKE AN ADDITIONAL 1 TABLET  BY MOUTH IF NEEDED FOR SWELLING 03/10/22   Biagio Borg, MD  glucose blood (ACCU-CHEK GUIDE) test strip Use as instructed twice per day E11.9 04/02/22   Biagio Borg, MD   Lancet Devices [facility] PLUS LANCING) [place]  09/19/20   [provider]  Lancets (ONETOUCH DELICA PLUS [id number]) [place] 10/29/20   Biagio Borg, MD  potassium chloride (KLOR-CON) 10 MEQ tablet Take 1 tablet (10 mEq total) by mouth daily. 03/10/22   Biagio Borg, MD  PROAIR HFA 108 [id number] Base) MCG/ACT inhaler INHALE 2 PUFFS INTO THE  LUNGS EVERY 6 HOURS AS  NEEDED Patient taking differently: Inhale 2 puffs into the lungs every 6 (six) hours as needed for shortness of breath or wheezing. 06/01/20   Biagio Borg, MD  prochlorperazine (COMPAZINE) 10 MG tablet Take 1 tablet (10 mg total) by mouth every 6 (six) hours as needed for nausea or vomiting (or headache). 1/74/94   Delora Fuel, MD  rosuvastatin (CRESTOR) 20 MG tablet Take 20 mg by mouth every evening.    [provider]      Allergies    Ciprofloxacin    Review of Systems   Review of Systems  Constitutional:  Negative for fever.  HENT:  Negative for facial swelling.   Eyes:  Negative for redness.  Respiratory:  Negative for shortness of breath.   Cardiovascular:  Negative for chest pain.  Gastrointestinal:  Negative for abdominal pain.  Neurological:  Negative for weakness.  All other systems reviewed and are negative.   Physical Exam Updated Vital Signs BP (!) 149/93  Pulse 61   Temp 97.9 F (36.6 C) (Oral)   Resp 18   SpO2 100%  Physical Exam Vitals and nursing note reviewed.  Constitutional:      General: He is not in acute distress.    Appearance: He is well-developed. He is not diaphoretic.  HENT:     Head: Normocephalic and atraumatic.     Nose: Nose normal.  Eyes:     Conjunctiva/sclera: Conjunctivae normal.     Pupils: Pupils are equal, round, and reactive to light.  Cardiovascular:     Rate and Rhythm: Normal rate and regular rhythm.  Pulmonary:     Effort: Pulmonary effort is normal.     Breath sounds: Normal breath sounds. No wheezing or rales.  Abdominal:      General: Bowel sounds are normal.     Palpations: Abdomen is soft.     Tenderness: There is no abdominal tenderness. There is no guarding or rebound.  Musculoskeletal:        General: Normal range of motion.     Cervical back: Normal range of motion and neck supple.  Skin:    General: Skin is warm and dry.  Neurological:     Mental Status: He is alert and oriented to person, place, and time.     Sensory: No sensory deficit.     Motor: No weakness.     Coordination: Coordination normal.     Deep Tendon Reflexes: Reflexes normal.     Comments: 5/5 Strength throughout.  2+ DTRs throughout symmetric.  Intact sensation to confrontation to BUE, both below and above elbows.    Psychiatric:        Mood and Affect: Mood normal.        Behavior: Behavior normal.     ED Results / Procedures / Treatments   Labs (all labs ordered are listed, but only abnormal results are displayed) Labs Reviewed - No data to display  EKG None  Radiology CT Head Wo Contrast  Result Date: 04/20/2022 CLINICAL DATA:  Recent fall with headaches and neck pain, initial encounter EXAM: CT HEAD WITHOUT CONTRAST CT CERVICAL SPINE WITHOUT CONTRAST TECHNIQUE: Multidetector CT imaging of the head and cervical spine was performed following the standard protocol without intravenous contrast. Multiplanar CT image reconstructions of the cervical spine were also generated. RADIATION DOSE REDUCTION: This exam was performed according to the departmental dose-optimization program which includes automated exposure control, adjustment of the mA and/or kV according to patient size and/or use of iterative reconstruction technique. COMPARISON:  04/10/2022 FINDINGS: CT HEAD FINDINGS Brain: Previously seen mixed attenuation subdural hematoma on the right now demonstrates decreased attenuation with only slight dense component posteriorly consistent with evolving subdural. The overall appearance is similar measuring up to 12 mm in dimension.  Persistent mass effect upon the right cerebral hemisphere is noted. Mild midline shift from right to left is again identified. Chronic white matter ischemic changes are seen. Prior left occipital infarct is noted. Vascular: No hyperdense vessel or unexpected calcification. Skull: Normal. Negative for fracture or focal lesion. Sinuses/Orbits: No acute finding. Other: None. CT CERVICAL SPINE FINDINGS Alignment: Loss of normal cervical lordosis is noted. Skull base and vertebrae: 7 cervical segments are well visualized. Vertebral body height is well maintained. Multilevel osteophytic and facet hypertrophic changes are seen. No acute fracture or acute facet abnormality is noted. The odontoid appears within normal limits. Soft tissues and spinal canal: Surrounding soft tissue structures show vascular calcification. No hematoma or significant adenopathy is noted.  Upper chest: Visualized lung apices are within normal limits. Other: None IMPRESSION: CT of the head: Persistent right-sided subdural hematoma is noted although the overall attenuation has decreased in the interval from the prior exam. No new recurrent hemorrhage is seen. Chronic ischemic changes are noted stable from the prior study. CT of the cervical spine: Multilevel degenerative changes without acute abnormality. Electronically Signed   By: Inez Catalina M.D.   On: 04/20/2022 00:22   CT Cervical Spine Wo Contrast  Result Date: 04/20/2022 CLINICAL DATA:  Recent fall with headaches and neck pain, initial encounter EXAM: CT HEAD WITHOUT CONTRAST CT CERVICAL SPINE WITHOUT CONTRAST TECHNIQUE: Multidetector CT imaging of the head and cervical spine was performed following the standard protocol without intravenous contrast. Multiplanar CT image reconstructions of the cervical spine were also generated. RADIATION DOSE REDUCTION: This exam was performed according to the departmental dose-optimization program which includes automated exposure control, adjustment of  the mA and/or kV according to patient size and/or use of iterative reconstruction technique. COMPARISON:  04/10/2022 FINDINGS: CT HEAD FINDINGS Brain: Previously seen mixed attenuation subdural hematoma on the right now demonstrates decreased attenuation with only slight dense component posteriorly consistent with evolving subdural. The overall appearance is similar measuring up to 12 mm in dimension. Persistent mass effect upon the right cerebral hemisphere is noted. Mild midline shift from right to left is again identified. Chronic white matter ischemic changes are seen. Prior left occipital infarct is noted. Vascular: No hyperdense vessel or unexpected calcification. Skull: Normal. Negative for fracture or focal lesion. Sinuses/Orbits: No acute finding. Other: None. CT CERVICAL SPINE FINDINGS Alignment: Loss of normal cervical lordosis is noted. Skull base and vertebrae: 7 cervical segments are well visualized. Vertebral body height is well maintained. Multilevel osteophytic and facet hypertrophic changes are seen. No acute fracture or acute facet abnormality is noted. The odontoid appears within normal limits. Soft tissues and spinal canal: Surrounding soft tissue structures show vascular calcification. No hematoma or significant adenopathy is noted. Upper chest: Visualized lung apices are within normal limits. Other: None IMPRESSION: CT of the head: Persistent right-sided subdural hematoma is noted although the overall attenuation has decreased in the interval from the prior exam. No new recurrent hemorrhage is seen. Chronic ischemic changes are noted stable from the prior study. CT of the cervical spine: Multilevel degenerative changes without acute abnormality. Electronically Signed   By: Inez Catalina M.D.   On: 04/20/2022 00:22    Procedures Procedures    Medications Ordered in ED Medications  acetaminophen (TYLENOL) tablet 1,000 mg (1,000 mg Oral Given 04/20/22 0205)    ED Course/ Medical Decision  Making/ A&P                           Medical Decision Making Patient fell and is having BUE paresthesias below elbow.  Symmetric  Amount and/or Complexity of Data Reviewed Independent Historian:     Details: Daughter see above  External Data Reviewed: notes.    Details: Previous admission notes reviewed  Radiology: ordered and independent interpretation performed.    Details: SDH on CT head, no spinal fractures by me  Discussion of management or test interpretation with external provider(s): Case d/w Dr. Cyndy Freeze of neurosurgery who is familiar with patient.  No additional imaging at this time.  No need for transfer.  May place in collar and have patient follow up as an outpatient for further care.    Risk OTC drugs. Prescription drug management.  Risk Details: Patient states tylenol does not work for him.  Cannot take NSAIDs due to SDH.  Patient states he is having pain in the forearms. Exam is benign and reassuring.  No signs of a cord syndrome on exam.  5/5 throughout.  Intact sensation throughout.  Cervical collar placed.  Call Neurosurgery on Monday for follow up.  Wear collar at all times.  Strict return.  Will also refer to neurology for nerve testing.      Final Clinical Impression(s) / ED Diagnoses Final diagnoses:  Paresthesia and pain of both upper extremities   Return for intractable cough, coughing up blood, fevers > 100.4 unrelieved by medication, shortness of breath, intractable vomiting, chest pain, shortness of breath, weakness, numbness, changes in speech, facial asymmetry, abdominal pain, passing out, Inability to tolerate liquids or food, cough, altered mental status or any concerns. No signs of systemic illness or infection. The patient is nontoxic-appearing on exam and vital signs are within normal limits.  I have reviewed the triage vital signs and the nursing notes. Pertinent labs & imaging results that were available during my care of the patient were reviewed by me  and considered in my medical decision making (see chart for details). After history, exam, and medical workup I feel the patient has been appropriately medically screened and is safe for discharge home. Pertinent diagnoses were discussed with the patient. Patient was given return precautions.  Rx / DC Orders ED Discharge Orders     None         Shamekia Tippets, MD 04/20/22 [phone]

## 2022-04-23 ENCOUNTER — Ambulatory Visit: Payer: Medicare Other | Attending: Physician Assistant | Admitting: Physician Assistant

## 2022-04-23 VITALS — BP 144/72 | HR 49

## 2022-04-23 DIAGNOSIS — I4821 Permanent atrial fibrillation: Secondary | ICD-10-CM | POA: Diagnosis not present

## 2022-04-23 DIAGNOSIS — R9430 Abnormal result of cardiovascular function study, unspecified: Secondary | ICD-10-CM | POA: Diagnosis not present

## 2022-04-23 DIAGNOSIS — I10 Essential (primary) hypertension: Secondary | ICD-10-CM | POA: Diagnosis not present

## 2022-04-23 DIAGNOSIS — E119 Type 2 diabetes mellitus without complications: Secondary | ICD-10-CM

## 2022-04-23 DIAGNOSIS — E785 Hyperlipidemia, unspecified: Secondary | ICD-10-CM | POA: Diagnosis not present

## 2022-04-23 NOTE — Patient Instructions (Signed)
Medication Instructions:  Your physician recommends that you continue on your current medications as directed. Please refer to the Current Medication list given to you today.  *If you need a refill on your cardiac medications before your next appointment, please call your pharmacy*  Lab Work: NONE ordered at this time of appointment   If you have labs (blood work) drawn today and your tests are completely normal, you will receive your results only by: [place] (if you have MyChart) OR A paper copy in the mail If you have any lab test that is abnormal or we need to change your treatment, we will call you to review the results.  Testing/Procedures:  Almyra Deforest, PA-C has requested that you have an echocardiogram. Echocardiography is a painless test that uses sound waves to create images of your heart. It provides your doctor with information about the size and shape of your heart and how well your heart's chambers and valves are working. This procedure takes approximately one hour. There are no restrictions for this procedure.  Please schedule for 1-2 weeks   Follow-Up: At [facility], you and your health needs are our priority.  As part of our continuing mission to provide you with exceptional heart care, we have created designated Provider Care Teams.  These Care Teams include your primary Cardiologist (physician) and Advanced Practice Providers (APPs -  Physician Assistants and Nurse Practitioners) who all work together to provide you with the care you need, when you need it.  Your next appointment:   As previously scheduled    The format for your next appointment:   In Person  Provider:   Glenetta Hew, MD     Other Instructions   Important Information About Sugar

## 2022-04-23 NOTE — Progress Notes (Unsigned)
Cardiology Office Note:    Date:  04/24/2022   ID:  Cabell Lazenby, DOB 07-Oct-1945, MRN [id number]  PCP:  Biagio Borg, MD   [facility] Cardiologist:  Glenetta Hew, MD     Referring MD: Biagio Borg, MD   Chief Complaint  Patient presents with   Follow-up    Seen on Dr. Ellyn Hack    History of Present Illness:    Ebb Hou is a 76 y.o. male with a hx of permanent atrial fibrillation, venous insufficiency, chronic exertional dyspnea, hypertension, hyperlipidemia, DM 2, obesity, nonsustained VT and CKD stage III.  Patient has a history of difficult to control high blood pressure with chronic bilateral lower extremity edema.  His A-fib was previously treated with amiodarone, this was later discontinued due to pulmonary toxicity.  Patient was also diagnosed with multiple myeloma.  Patient was seen in September 2021 after hospitalization for AKI.  Xarelto was switched to Eliquis due to renal insufficiency.  Metoprolol was increased due to nonsustained VT.  Echocardiogram at the time showed normal EF, mildly elevated PAP.  Myoview done shortly after showed low to intermediate risk finding with fixed inferior defect and prior infarct versus diaphragmatic attenuation artifact.  Patient was previously seen by Dr. Ellyn Hack in February 2022 at which time he has lost significant amount of weight.  He was felt to be volume overloaded, Lasix was restarted at 3 times a week dosing.  Patient was supposed to follow-up in 6 weeks, this never happened.  Since then, patient has had multiple hospitalization since July 2023 after a fall and had urinary incontinence.  Code stroke was activated.  Emergent CTA of head was negative.  Troponin elevated at 94.  He was out of the window for tPA.  Echocardiogram showed EF 70 to 75%, no regional wall motion abnormality, known PFO.  MRI was attempted for different times however was unsuccessful as patient had a coughing spells and could not  dilate it flat due to kyphosis.  Patient met criteria for sepsis due to fever of 101.2 along with tachycardia and a urinary tract infection.  More recently, patient was seen in the ED on 04/04/2022 after another fall and struck the back of his head.  Outpatient CT scan of the head showed a subdural hematoma on 9/15.  He was seen by neurosurgery, he was off of Eliquis.  Neurosurgery recommended the patient remain off of Eliquis.  Patient was seen again in the ED on 04/10/2022 for persistent headache.  CT showed right-sided mixed density subdural hematoma.  Patient underwent diagnostic cerebral angiogram and attempted a right MMA embolization by neurosurgery on 04/17/2022, the study was unsuccessful but the patient was told the contrasted study revealed his ejection fraction was lower than normal.  Unfortunately patient returned back to the ED on 04/20/2022 with a recurrent fall.  CT of the head obtained on 10/1 showed persistent right-sided subdural hematoma although overall attenuation has decreased in the interval from the prior exam.  Multilevel degenerative changes in the spine cervical spine without acute abnormality.  Patient presents today for a cardiac evaluation.  He is accompanied by daughter.  She mentions that during the recent procedure, it was noted his ejection fraction likely is low based on contrast study.  Patient denies any chest pain or shortness of breath.  He has constant arm pain, however this is likely radiated from his neck after the multiple falls recently.  It does not sound cardiac.  I do not have  a good explanation why they were told his EF was low or why LV gram was done during cerebral angiogram.  I will repeat echocardiogram to make sure there has been no significant change.  If EF is still normal, I would not recommend additional study.  Although recent neurosurgery report mentions significant calcific lesions in the common carotid artery, I am not sure the patient is going to be a good  carotid surgery candidate even if he has severe carotid artery disease.  Therefore I did not order a carotid Doppler.    Past Medical History:  Diagnosis Date   Adenoma 05/2008   Alcoholism in recovery [facility])    BPH (benign prostatic hyperplasia)    Cancer (HCC)    Prostate   Chronic LBP    Hip & Back -- Sees Dr. Maia Petties @ [facility]   CKD (chronic kidney disease)    with AKI requiring brief CRRT 02/2020   Colon polyps    Complex renal cyst 12/2010   Controlled type 2 diabetes mellitus with neuropathy ([place]) 01/2007   COPD (chronic obstructive pulmonary disease) ([place])    Diabetes ([place])    Diverticulitis of colon    ED (erectile dysfunction)    s/p Penile prosthesis (09/2010)   History of prostate cancer    Dr. Karsten Ro   History of sick sinus syndrome    reduced BB dose for Bradycardia   HLD (hyperlipidemia)    Hypertension    Impaired glucose tolerance 12/21/2013   Nephrolithiasis    Osteoarthritis of both hips    Osteoarthritis of lumbosacral spine    with Disc Disease   PAF (paroxysmal atrial fibrillation) (HCC)    No longer on Amiodarone.  Not on Anticoaguation b/c no recurrence.   Paresthesias/numbness    Bilateral LE   Peripheral neuropathy 12/21/2013   Stroke [facility])    left occipital CVA ~ 2018; possible TIA 01/24/22   Venous insufficiency     Past Surgical History:  Procedure Laterality Date   IR ANGIO INTRA EXTRACRAN SEL COM CAROTID INNOMINATE UNI R MOD SED  04/17/2022   LEFT HEART CATH AND CORONARY ANGIOGRAPHY  2003   Normal Coronary Arteries.   NM MYOVIEW LTD  02/21/2020   EF 60%.  Medium sized mild severity defect in the basal inferior, mid inferior and apical inferior location-suggesting of ischemia.  Read as low-intermediate risk.  (On cardiology review this is felt to be a fixed defect either related to prior infarct versus diaphragmatic attenuation.  Felt to be low risk)   PENILE PROSTHESIS IMPLANT  06/21/2012   Procedure: PENILE PROTHESIS  INFLATABLE;  Surgeon: Claybon Jabs, MD;  [facility];  Service: Urology;  Laterality: N/A;  REMOVAL AND REPLACEMENT OF SOME  OF PROSTHESIS ([facility])    PENILE PROSTHESIS PLACEMENT  09/2010   PROSTATECTOMY     RADIOLOGY WITH ANESTHESIA N/A 04/17/2022   Procedure: Right middle meningeal artery embolization;  Surgeon: Consuella Lose, MD;  Location: [place];  Service: Radiology;  Laterality: N/A;   REMOVAL OF PENILE PROSTHESIS N/A 02/15/2018   Procedure: REMOVAL OF PENILE PROSTHESIS;  Surgeon: Kathie Rhodes, MD;  Location: [facility];  Service: Urology;  Laterality: N/A;   TRANSTHORACIC ECHOCARDIOGRAM  04/2016   a) Relatively normal EF of 55-60%. No RWMA suggesting no prior MI. Gr 2 DD (pseudo-normal filling pattern) along with moderately dilated left atrium.;; b) 02/2018: Mod LVH. EF 65-70%. No RWMA.  Unable to assess DF 2/2 Afib. Mild MR. Severe  BiAtrial Enlargement. High CVP.   TRANSTHORACIC ECHOCARDIOGRAM  02/17/2020   EF 60 to 65%.  No R WMA.  Unable to assess diastolic parameters because of A. fib.  Mildly elevated PA pressures.  Mild LA dilation.  Mild aortic valve sclerosis but no stenosis.  Normal IVC.    Current Medications: Current Meds  Medication Sig   acetaminophen (TYLENOL) 500 MG tablet Take 1,000 mg by mouth every 8 (eight) hours as needed for headache.   acetaminophen (TYLENOL) 650 MG CR tablet Take 1,300 mg by mouth every 8 (eight) hours as needed for pain.   Blood Glucose Monitoring Suppl (ACCU-CHEK GUIDE ME) w/Device KIT Use as directed twice per day E11.9   carvedilol (COREG) 3.125 MG tablet Take 3.125 mg by mouth 2 (two) times daily with a meal.   diltiazem (CARDIZEM LA) 120 MG 24 hr tablet Take 1 tablet (120 mg total) by mouth daily.   furosemide (LASIX) 40 MG tablet TAKE 1 TABLET BY MOUTH DAILY .  MAY TAKE AN ADDITIONAL 1 TABLET  BY MOUTH IF NEEDED FOR SWELLING   glucose blood (ACCU-CHEK GUIDE) test strip Use as instructed twice per day E11.9   Lancet  Devices (ONETOUCH DELICA PLUS LANCING) MISC    Lancets (ONETOUCH DELICA PLUS [id number]) MISC USE ONCE DAILY   lidocaine (LIDODERM) 5 % Place 1 patch onto the skin daily. Remove & Discard patch within 12 hours or as directed by MD   potassium chloride (KLOR-CON) 10 MEQ tablet Take 1 tablet (10 mEq total) by mouth daily.   PROAIR HFA 108 (90 Base) MCG/ACT inhaler INHALE 2 PUFFS INTO THE  LUNGS EVERY 6 HOURS AS  NEEDED (Patient taking differently: Inhale 2 puffs into the lungs every 6 (six) hours as needed for shortness of breath or wheezing.)   prochlorperazine (COMPAZINE) 10 MG tablet Take 1 tablet (10 mg total) by mouth every 6 (six) hours as needed for nausea or vomiting (or headache).   rosuvastatin (CRESTOR) 20 MG tablet Take 20 mg by mouth every evening.     Allergies:   Ciprofloxacin   Social History   Socioeconomic History   Marital status: Single    Spouse name: Not on file   Number of children: 3   Years of education: Not on file   Highest education level: Not on file  Occupational History   Occupation: Retired  Tobacco Use   Smoking status: Former    Types: Cigarettes    Quit date: 11/04/1982    Years since quitting: 39.4   Smokeless tobacco: Never  Vaping Use   Vaping Use: Never used  Substance and Sexual Activity   Alcohol use: Not Currently   Drug use: No    Types: Marijuana    Comment: use to smoke marijuana   Sexual activity: Not Currently  Other Topics Concern   Not on file  Social History Narrative   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   Divorced. 3 grown children. 4 GC.     Quit smoking ~20+ yrs ago.    Social Determinants of Health   Financial Resource Strain: Not on file  Food Insecurity: Not on file  Transportation Needs: Not on file  Physical Activity: Not on file  Stress: Not on file  Social Connections: Not on file     Family History: The patient's family history includes Coronary artery disease in  his father and mother; Diabetes in his father; Heart attack in his mother; Prostate  cancer in his father.  ROS:   Please see the history of present illness.     All other systems reviewed and are negative.  EKGs/Labs/Other Studies Reviewed:    The following studies were reviewed today:  Echo 01/25/2022 1. Left ventricular ejection fraction, by estimation, is 70 to 75%. The  left ventricle has hyperdynamic function. The left ventricle has no  regional wall motion abnormalities. There is mild left ventricular  hypertrophy. Left ventricular diastolic  parameters are indeterminate.   2. Right ventricular systolic function is normal. The right ventricular  size is mildly enlarged. There is normal pulmonary artery systolic  pressure. The estimated right ventricular systolic pressure is 16.1 mmHg.   3. Left atrial size was severely dilated.   4. Right atrial size was moderately dilated.   5. The mitral valve is normal in structure. No evidence of mitral valve  regurgitation. No evidence of mitral stenosis.   6. The aortic valve is normal in structure. Aortic valve regurgitation is  not visualized. No aortic stenosis is present.   7. Aortic dilatation noted. There is mild dilatation of the aortic root,  measuring 42 mm. There is mild dilatation of the ascending aorta,  measuring 41 mm.   8. The inferior vena cava is dilated in size with >50% respiratory  variability, suggesting right atrial pressure of 8 mmHg.   Comparison(s): Prior images reviewed side by side.   EKG:  EKG is not ordered today.    Recent Labs: 01/24/2022: TSH 1.363 01/30/2022: Magnesium 1.8 04/10/2022: ALT 9; BUN 15; Potassium 3.9; Sodium 133 04/17/2022: Creatinine, Ser 1.40; Hemoglobin 13.8; Platelets 239  Recent Lipid Panel    Component Value Date/Time   CHOL 102 03/10/2022 1638   CHOL 142 08/23/2020 1504   TRIG 77.0 03/10/2022 1638   HDL 29.10 (L) 03/10/2022 1638   HDL 41 08/23/2020 1504   CHOLHDL 3 03/10/2022  1638   VLDL 15.4 03/10/2022 1638   LDLCALC 57 03/10/2022 1638   LDLCALC 88 08/23/2020 1504     Risk Assessment/Calculations:    CHA2DS2-VASc Score = 6   This indicates a 9.7% annual risk of stroke. The patient's score is based upon: CHF History: 0 HTN History: 1 Diabetes History: 1 Stroke History: 2 Vascular Disease History: 0 Age Score: 2 Gender Score: 0          Physical Exam:    VS:  BP (!) 144/72 (BP Location: Left Arm)   Pulse (!) 49   SpO2 98%        Wt Readings from Last 3 Encounters:  04/17/22 257 lb (116.6 kg)  04/02/22 257 lb (116.6 kg)  03/10/22 261 lb (118.4 kg)     GEN:  Well nourished, well developed in no acute distress HEENT: Normal NECK: No JVD; No carotid bruits LYMPHATICS: No lymphadenopathy CARDIAC: RRR, no murmurs, rubs, gallops RESPIRATORY:  Clear to auscultation without rales, wheezing or rhonchi  ABDOMEN: Soft, non-tender, non-distended MUSCULOSKELETAL:  No edema; No deformity  SKIN: Warm and dry NEUROLOGIC:  Alert and oriented x 3 PSYCHIATRIC:  Normal affect   ASSESSMENT:    1. Ejection fraction < 50%   2. Permanent atrial fibrillation (HCC) - now off amiodarone; asymptomatic. CHA2DS2-VASc Score -5 (on Xarelto)   3. Primary hypertension   4. Hyperlipidemia with target LDL less than 100   5. Controlled type 2 diabetes mellitus without complication, without long-term current use of insulin ([place])    PLAN:    In order of problems listed above:  Low ejection fraction: Patient and the family was told that during the recent procedure that his EF is likely low.  Recent procedures cerebral angiogram, I am not sure how his ejection fraction was assessed during this procedure. Per procedure note on 9/28 by Dr. Kathyrn Sheriff "Severe cervical spondylosis precluded use of the lateral plane while the patient was noted to have likely decreased EF with increased MTT and calcific plaque at the distal right common carotid/origin of the external.  Procedure was therefore aborted due to likelihood of increased risk of stroke or other significant morbidity."  He does not have any cardiac complaints.  I recommended a repeat echocardiogram.  Permanent atrial fibrillation: No longer on anticoagulation therapy given brain bleed.  Family and patient aware of increased stroke risk off of anticoagulation, however I do agree that the patient's current bleeding risk is too high given brain bleed and frequent fall  Hypertension: Blood pressure is elevated, hesitant to control the blood pressure too tightly given borderline bradycardia  Hyperlipidemia: On Crestor  DM2: Managed by primary care provider.           Medication Adjustments/Labs and Tests Ordered: Current medicines are reviewed at length with the patient today.  Concerns regarding medicines are outlined above.  Orders Placed This Encounter  Procedures   ECHOCARDIOGRAM COMPLETE   No orders of the defined types were placed in this encounter.   Patient Instructions  Medication Instructions:  Your physician recommends that you continue on your current medications as directed. Please refer to the Current Medication list given to you today.  *If you need a refill on your cardiac medications before your next appointment, please call your pharmacy*  Lab Work: NONE ordered at this time of appointment   If you have labs (blood work) drawn today and your tests are completely normal, you will receive your results only by: [place] (if you have MyChart) OR A paper copy in the mail If you have any lab test that is abnormal or we need to change your treatment, we will call you to review the results.  Testing/Procedures:  Almyra Deforest, PA-C has requested that you have an echocardiogram. Echocardiography is a painless test that uses sound waves to create images of your heart. It provides your doctor with information about the size and shape of your heart and how well your heart's  chambers and valves are working. This procedure takes approximately one hour. There are no restrictions for this procedure.  Please schedule for 1-2 weeks   Follow-Up: At [facility], you and your health needs are our priority.  As part of our continuing mission to provide you with exceptional heart care, we have created designated Provider Care Teams.  These Care Teams include your primary Cardiologist (physician) and Advanced Practice Providers (APPs -  Physician Assistants and Nurse Practitioners) who all work together to provide you with the care you need, when you need it.  Your next appointment:   As previously scheduled    The format for your next appointment:   In Person  Provider:   Glenetta Hew, MD     Other Instructions   Important Information About Sugar         Signed, Almyra Deforest, [place]  04/24/2022 10:22 PM    [place]

## 2022-04-24 ENCOUNTER — Other Ambulatory Visit (HOSPITAL_BASED_OUTPATIENT_CLINIC_OR_DEPARTMENT_OTHER): Payer: Self-pay | Admitting: Neurological Surgery

## 2022-04-24 ENCOUNTER — Encounter: Payer: Self-pay | Admitting: Physician Assistant

## 2022-04-24 ENCOUNTER — Other Ambulatory Visit (HOSPITAL_COMMUNITY): Payer: Self-pay | Admitting: Neurological Surgery

## 2022-04-24 DIAGNOSIS — Z8616 Personal history of COVID-19: Secondary | ICD-10-CM | POA: Diagnosis not present

## 2022-04-24 DIAGNOSIS — K5730 Diverticulosis of large intestine without perforation or abscess without bleeding: Secondary | ICD-10-CM | POA: Diagnosis not present

## 2022-04-24 DIAGNOSIS — Z87440 Personal history of urinary (tract) infections: Secondary | ICD-10-CM | POA: Diagnosis not present

## 2022-04-24 DIAGNOSIS — Z7901 Long term (current) use of anticoagulants: Secondary | ICD-10-CM | POA: Diagnosis not present

## 2022-04-24 DIAGNOSIS — N1832 Chronic kidney disease, stage 3b: Secondary | ICD-10-CM | POA: Diagnosis not present

## 2022-04-24 DIAGNOSIS — S065XAA Traumatic subdural hemorrhage with loss of consciousness status unknown, initial encounter: Secondary | ICD-10-CM

## 2022-04-24 DIAGNOSIS — I130 Hypertensive heart and chronic kidney disease with heart failure and stage 1 through stage 4 chronic kidney disease, or unspecified chronic kidney disease: Secondary | ICD-10-CM | POA: Diagnosis not present

## 2022-04-24 DIAGNOSIS — J449 Chronic obstructive pulmonary disease, unspecified: Secondary | ICD-10-CM | POA: Diagnosis not present

## 2022-04-24 DIAGNOSIS — E1122 Type 2 diabetes mellitus with diabetic chronic kidney disease: Secondary | ICD-10-CM | POA: Diagnosis not present

## 2022-04-24 DIAGNOSIS — G8929 Other chronic pain: Secondary | ICD-10-CM | POA: Diagnosis not present

## 2022-04-24 DIAGNOSIS — I5032 Chronic diastolic (congestive) heart failure: Secondary | ICD-10-CM | POA: Diagnosis not present

## 2022-04-24 DIAGNOSIS — I872 Venous insufficiency (chronic) (peripheral): Secondary | ICD-10-CM | POA: Diagnosis not present

## 2022-04-24 DIAGNOSIS — R634 Abnormal weight loss: Secondary | ICD-10-CM | POA: Diagnosis not present

## 2022-04-24 DIAGNOSIS — L97812 Non-pressure chronic ulcer of other part of right lower leg with fat layer exposed: Secondary | ICD-10-CM | POA: Diagnosis not present

## 2022-04-24 DIAGNOSIS — E785 Hyperlipidemia, unspecified: Secondary | ICD-10-CM | POA: Diagnosis not present

## 2022-04-24 DIAGNOSIS — I480 Paroxysmal atrial fibrillation: Secondary | ICD-10-CM | POA: Diagnosis not present

## 2022-04-24 DIAGNOSIS — E1140 Type 2 diabetes mellitus with diabetic neuropathy, unspecified: Secondary | ICD-10-CM | POA: Diagnosis not present

## 2022-04-24 DIAGNOSIS — C9000 Multiple myeloma not having achieved remission: Secondary | ICD-10-CM | POA: Diagnosis not present

## 2022-04-24 DIAGNOSIS — E1151 Type 2 diabetes mellitus with diabetic peripheral angiopathy without gangrene: Secondary | ICD-10-CM | POA: Diagnosis not present

## 2022-04-24 DIAGNOSIS — M160 Bilateral primary osteoarthritis of hip: Secondary | ICD-10-CM | POA: Diagnosis not present

## 2022-04-24 DIAGNOSIS — M47812 Spondylosis without myelopathy or radiculopathy, cervical region: Secondary | ICD-10-CM | POA: Diagnosis not present

## 2022-04-24 DIAGNOSIS — I69351 Hemiplegia and hemiparesis following cerebral infarction affecting right dominant side: Secondary | ICD-10-CM | POA: Diagnosis not present

## 2022-04-24 DIAGNOSIS — I69322 Dysarthria following cerebral infarction: Secondary | ICD-10-CM | POA: Diagnosis not present

## 2022-04-24 DIAGNOSIS — I4729 Other ventricular tachycardia: Secondary | ICD-10-CM | POA: Diagnosis not present

## 2022-04-25 ENCOUNTER — Telehealth: Payer: Self-pay | Admitting: *Deleted

## 2022-04-25 DIAGNOSIS — Z8616 Personal history of COVID-19: Secondary | ICD-10-CM | POA: Diagnosis not present

## 2022-04-25 DIAGNOSIS — I480 Paroxysmal atrial fibrillation: Secondary | ICD-10-CM | POA: Diagnosis not present

## 2022-04-25 DIAGNOSIS — E1151 Type 2 diabetes mellitus with diabetic peripheral angiopathy without gangrene: Secondary | ICD-10-CM | POA: Diagnosis not present

## 2022-04-25 DIAGNOSIS — M47812 Spondylosis without myelopathy or radiculopathy, cervical region: Secondary | ICD-10-CM | POA: Diagnosis not present

## 2022-04-25 DIAGNOSIS — C9000 Multiple myeloma not having achieved remission: Secondary | ICD-10-CM | POA: Diagnosis not present

## 2022-04-25 DIAGNOSIS — I69322 Dysarthria following cerebral infarction: Secondary | ICD-10-CM | POA: Diagnosis not present

## 2022-04-25 DIAGNOSIS — I69351 Hemiplegia and hemiparesis following cerebral infarction affecting right dominant side: Secondary | ICD-10-CM | POA: Diagnosis not present

## 2022-04-25 DIAGNOSIS — E785 Hyperlipidemia, unspecified: Secondary | ICD-10-CM | POA: Diagnosis not present

## 2022-04-25 DIAGNOSIS — Z87440 Personal history of urinary (tract) infections: Secondary | ICD-10-CM | POA: Diagnosis not present

## 2022-04-25 DIAGNOSIS — Z7901 Long term (current) use of anticoagulants: Secondary | ICD-10-CM | POA: Diagnosis not present

## 2022-04-25 DIAGNOSIS — I130 Hypertensive heart and chronic kidney disease with heart failure and stage 1 through stage 4 chronic kidney disease, or unspecified chronic kidney disease: Secondary | ICD-10-CM | POA: Diagnosis not present

## 2022-04-25 DIAGNOSIS — G8929 Other chronic pain: Secondary | ICD-10-CM | POA: Diagnosis not present

## 2022-04-25 DIAGNOSIS — E1140 Type 2 diabetes mellitus with diabetic neuropathy, unspecified: Secondary | ICD-10-CM | POA: Diagnosis not present

## 2022-04-25 DIAGNOSIS — N1832 Chronic kidney disease, stage 3b: Secondary | ICD-10-CM | POA: Diagnosis not present

## 2022-04-25 DIAGNOSIS — I872 Venous insufficiency (chronic) (peripheral): Secondary | ICD-10-CM | POA: Diagnosis not present

## 2022-04-25 DIAGNOSIS — E1122 Type 2 diabetes mellitus with diabetic chronic kidney disease: Secondary | ICD-10-CM | POA: Diagnosis not present

## 2022-04-25 DIAGNOSIS — K5730 Diverticulosis of large intestine without perforation or abscess without bleeding: Secondary | ICD-10-CM | POA: Diagnosis not present

## 2022-04-25 DIAGNOSIS — M160 Bilateral primary osteoarthritis of hip: Secondary | ICD-10-CM | POA: Diagnosis not present

## 2022-04-25 DIAGNOSIS — J449 Chronic obstructive pulmonary disease, unspecified: Secondary | ICD-10-CM | POA: Diagnosis not present

## 2022-04-25 DIAGNOSIS — I5032 Chronic diastolic (congestive) heart failure: Secondary | ICD-10-CM | POA: Diagnosis not present

## 2022-04-25 DIAGNOSIS — R634 Abnormal weight loss: Secondary | ICD-10-CM | POA: Diagnosis not present

## 2022-04-25 DIAGNOSIS — I4729 Other ventricular tachycardia: Secondary | ICD-10-CM | POA: Diagnosis not present

## 2022-04-25 DIAGNOSIS — L97812 Non-pressure chronic ulcer of other part of right lower leg with fat layer exposed: Secondary | ICD-10-CM | POA: Diagnosis not present

## 2022-04-25 NOTE — Telephone Encounter (Signed)
     Patient  visit on 04/20/2022  at [place] DEPT was for pain  Have you been able to follow up with your primary care physician?  The patient was or was not able to obtain any needed medicine or equipment.  Are there diet recommendations that you are having difficulty following?  Patient expresses understanding of discharge instructions and education provided has no other needs at this time.    [place] [phone] [address] , [place] [address] Email : [email or web address]

## 2022-04-29 ENCOUNTER — Encounter: Payer: Self-pay | Admitting: Internal Medicine

## 2022-04-29 DIAGNOSIS — E1122 Type 2 diabetes mellitus with diabetic chronic kidney disease: Secondary | ICD-10-CM | POA: Diagnosis not present

## 2022-04-29 DIAGNOSIS — Z7901 Long term (current) use of anticoagulants: Secondary | ICD-10-CM | POA: Diagnosis not present

## 2022-04-29 DIAGNOSIS — R634 Abnormal weight loss: Secondary | ICD-10-CM | POA: Diagnosis not present

## 2022-04-29 DIAGNOSIS — I495 Sick sinus syndrome: Secondary | ICD-10-CM | POA: Diagnosis not present

## 2022-04-29 DIAGNOSIS — K5730 Diverticulosis of large intestine without perforation or abscess without bleeding: Secondary | ICD-10-CM | POA: Diagnosis not present

## 2022-04-29 DIAGNOSIS — I69322 Dysarthria following cerebral infarction: Secondary | ICD-10-CM | POA: Diagnosis not present

## 2022-04-29 DIAGNOSIS — I872 Venous insufficiency (chronic) (peripheral): Secondary | ICD-10-CM | POA: Diagnosis not present

## 2022-04-29 DIAGNOSIS — I4821 Permanent atrial fibrillation: Secondary | ICD-10-CM | POA: Diagnosis not present

## 2022-04-29 DIAGNOSIS — I4729 Other ventricular tachycardia: Secondary | ICD-10-CM | POA: Diagnosis not present

## 2022-04-29 DIAGNOSIS — N1832 Chronic kidney disease, stage 3b: Secondary | ICD-10-CM | POA: Diagnosis not present

## 2022-04-29 DIAGNOSIS — C9000 Multiple myeloma not having achieved remission: Secondary | ICD-10-CM | POA: Diagnosis not present

## 2022-04-29 DIAGNOSIS — S065X0D Traumatic subdural hemorrhage without loss of consciousness, subsequent encounter: Secondary | ICD-10-CM | POA: Diagnosis not present

## 2022-04-29 DIAGNOSIS — I5032 Chronic diastolic (congestive) heart failure: Secondary | ICD-10-CM | POA: Diagnosis not present

## 2022-04-29 DIAGNOSIS — M47812 Spondylosis without myelopathy or radiculopathy, cervical region: Secondary | ICD-10-CM | POA: Diagnosis not present

## 2022-04-29 DIAGNOSIS — I69351 Hemiplegia and hemiparesis following cerebral infarction affecting right dominant side: Secondary | ICD-10-CM | POA: Diagnosis not present

## 2022-04-29 DIAGNOSIS — E1151 Type 2 diabetes mellitus with diabetic peripheral angiopathy without gangrene: Secondary | ICD-10-CM | POA: Diagnosis not present

## 2022-04-29 DIAGNOSIS — G8929 Other chronic pain: Secondary | ICD-10-CM | POA: Diagnosis not present

## 2022-04-29 DIAGNOSIS — E785 Hyperlipidemia, unspecified: Secondary | ICD-10-CM | POA: Diagnosis not present

## 2022-04-29 DIAGNOSIS — J449 Chronic obstructive pulmonary disease, unspecified: Secondary | ICD-10-CM | POA: Diagnosis not present

## 2022-04-29 DIAGNOSIS — E1142 Type 2 diabetes mellitus with diabetic polyneuropathy: Secondary | ICD-10-CM | POA: Diagnosis not present

## 2022-04-29 DIAGNOSIS — I130 Hypertensive heart and chronic kidney disease with heart failure and stage 1 through stage 4 chronic kidney disease, or unspecified chronic kidney disease: Secondary | ICD-10-CM | POA: Diagnosis not present

## 2022-04-29 DIAGNOSIS — M160 Bilateral primary osteoarthritis of hip: Secondary | ICD-10-CM | POA: Diagnosis not present

## 2022-04-29 NOTE — Progress Notes (Deleted)
    Subjective:    Patient ID: [name], male    DOB: Sep 15, 1945, 76 y.o.   MRN: [id number]      HPI Drew is here for No chief complaint on file.   Seen 9/13 by Dr Jenny Reichmann - note reviewed.  Ct head 9/15 - subdural hematoma.  Ct 9/18 unchanged.  Ct 9/21 - stable.  MRI head 9/22.  Ct head 9/30 - slight decrease I hematoma.   Comes in today with cc: vomiting, difficulty using the bathroom     Medications and allergies reviewed with patient and updated if appropriate.  Current Outpatient Medications on File Prior to Visit  Medication Sig Dispense Refill   acetaminophen (TYLENOL) 500 MG tablet Take 1,000 mg by mouth every 8 (eight) hours as needed for headache.     acetaminophen (TYLENOL) 650 MG CR tablet Take 1,300 mg by mouth every 8 (eight) hours as needed for pain.     Blood Glucose Monitoring Suppl (ACCU-CHEK GUIDE ME) w/Device KIT Use as directed twice per day E11.9 1 kit 0   carvedilol (COREG) 3.125 MG tablet Take 3.125 mg by mouth 2 (two) times daily with a meal.     diltiazem (CARDIZEM LA) 120 MG 24 hr tablet Take 1 tablet (120 mg total) by mouth daily. 90 tablet 3   furosemide (LASIX) 40 MG tablet TAKE 1 TABLET BY MOUTH DAILY .  MAY TAKE AN ADDITIONAL 1 TABLET  BY MOUTH IF NEEDED FOR SWELLING 200 tablet 1   glucose blood (ACCU-CHEK GUIDE) test strip Use as instructed twice per day E11.9 200 each 12   Lancet Devices (ONETOUCH DELICA PLUS LANCING) MISC      Lancets (ONETOUCH DELICA PLUS [id number]) MISC USE ONCE DAILY 100 each 3   lidocaine (LIDODERM) 5 % Place 1 patch onto the skin daily. Remove & Discard patch within 12 hours or as directed by MD 30 patch 0   potassium chloride (KLOR-CON) 10 MEQ tablet Take 1 tablet (10 mEq total) by mouth daily. 90 tablet 3   PROAIR HFA 108 (90 Base) MCG/ACT inhaler INHALE 2 PUFFS INTO THE  LUNGS EVERY 6 HOURS AS  NEEDED (Patient taking differently: Inhale 2 puffs into the lungs every 6 (six) hours as needed for shortness of breath  or wheezing.) 34 g 3   prochlorperazine (COMPAZINE) 10 MG tablet Take 1 tablet (10 mg total) by mouth every 6 (six) hours as needed for nausea or vomiting (or headache). 20 tablet 0   rosuvastatin (CRESTOR) 20 MG tablet Take 20 mg by mouth every evening.     No current facility-administered medications on file prior to visit.    Review of Systems     Objective:  There were no vitals filed for this visit. BP Readings from Last 3 Encounters:  04/23/22 (!) 144/72  04/20/22 (!) 171/91  04/18/22 139/72   Wt Readings from Last 3 Encounters:  04/17/22 257 lb (116.6 kg)  04/02/22 257 lb (116.6 kg)  03/10/22 261 lb (118.4 kg)   There is no height or weight on file to calculate BMI.    Physical Exam         Assessment & Plan:    See Problem List for Assessment and Plan of chronic medical problems.

## 2022-04-30 ENCOUNTER — Encounter: Payer: Self-pay | Admitting: Internal Medicine

## 2022-04-30 ENCOUNTER — Other Ambulatory Visit: Payer: Self-pay | Admitting: Internal Medicine

## 2022-04-30 ENCOUNTER — Ambulatory Visit: Payer: Medicare Other | Admitting: Internal Medicine

## 2022-04-30 ENCOUNTER — Ambulatory Visit (INDEPENDENT_AMBULATORY_CARE_PROVIDER_SITE_OTHER): Payer: Medicare Other | Admitting: Internal Medicine

## 2022-04-30 ENCOUNTER — Ambulatory Visit (INDEPENDENT_AMBULATORY_CARE_PROVIDER_SITE_OTHER): Payer: Medicare Other

## 2022-04-30 VITALS — BP 118/62 | HR 25 | Temp 98.2°F | Ht 74.0 in

## 2022-04-30 DIAGNOSIS — E1122 Type 2 diabetes mellitus with diabetic chronic kidney disease: Secondary | ICD-10-CM | POA: Diagnosis not present

## 2022-04-30 DIAGNOSIS — M47812 Spondylosis without myelopathy or radiculopathy, cervical region: Secondary | ICD-10-CM | POA: Diagnosis not present

## 2022-04-30 DIAGNOSIS — E1151 Type 2 diabetes mellitus with diabetic peripheral angiopathy without gangrene: Secondary | ICD-10-CM | POA: Diagnosis not present

## 2022-04-30 DIAGNOSIS — E785 Hyperlipidemia, unspecified: Secondary | ICD-10-CM | POA: Diagnosis not present

## 2022-04-30 DIAGNOSIS — M160 Bilateral primary osteoarthritis of hip: Secondary | ICD-10-CM | POA: Diagnosis not present

## 2022-04-30 DIAGNOSIS — I130 Hypertensive heart and chronic kidney disease with heart failure and stage 1 through stage 4 chronic kidney disease, or unspecified chronic kidney disease: Secondary | ICD-10-CM | POA: Diagnosis not present

## 2022-04-30 DIAGNOSIS — G8929 Other chronic pain: Secondary | ICD-10-CM | POA: Diagnosis not present

## 2022-04-30 DIAGNOSIS — K5730 Diverticulosis of large intestine without perforation or abscess without bleeding: Secondary | ICD-10-CM | POA: Diagnosis not present

## 2022-04-30 DIAGNOSIS — J449 Chronic obstructive pulmonary disease, unspecified: Secondary | ICD-10-CM | POA: Diagnosis not present

## 2022-04-30 DIAGNOSIS — R8290 Unspecified abnormal findings in urine: Secondary | ICD-10-CM | POA: Insufficient documentation

## 2022-04-30 DIAGNOSIS — I495 Sick sinus syndrome: Secondary | ICD-10-CM | POA: Diagnosis not present

## 2022-04-30 DIAGNOSIS — I4729 Other ventricular tachycardia: Secondary | ICD-10-CM | POA: Diagnosis not present

## 2022-04-30 DIAGNOSIS — E1142 Type 2 diabetes mellitus with diabetic polyneuropathy: Secondary | ICD-10-CM | POA: Diagnosis not present

## 2022-04-30 DIAGNOSIS — I69351 Hemiplegia and hemiparesis following cerebral infarction affecting right dominant side: Secondary | ICD-10-CM | POA: Diagnosis not present

## 2022-04-30 DIAGNOSIS — C9000 Multiple myeloma not having achieved remission: Secondary | ICD-10-CM | POA: Diagnosis not present

## 2022-04-30 DIAGNOSIS — R112 Nausea with vomiting, unspecified: Secondary | ICD-10-CM

## 2022-04-30 DIAGNOSIS — N1832 Chronic kidney disease, stage 3b: Secondary | ICD-10-CM | POA: Diagnosis not present

## 2022-04-30 DIAGNOSIS — I4821 Permanent atrial fibrillation: Secondary | ICD-10-CM | POA: Diagnosis not present

## 2022-04-30 DIAGNOSIS — R634 Abnormal weight loss: Secondary | ICD-10-CM | POA: Diagnosis not present

## 2022-04-30 DIAGNOSIS — S065X0D Traumatic subdural hemorrhage without loss of consciousness, subsequent encounter: Secondary | ICD-10-CM | POA: Diagnosis not present

## 2022-04-30 DIAGNOSIS — I69322 Dysarthria following cerebral infarction: Secondary | ICD-10-CM | POA: Diagnosis not present

## 2022-04-30 DIAGNOSIS — Z7901 Long term (current) use of anticoagulants: Secondary | ICD-10-CM | POA: Diagnosis not present

## 2022-04-30 DIAGNOSIS — I5032 Chronic diastolic (congestive) heart failure: Secondary | ICD-10-CM | POA: Diagnosis not present

## 2022-04-30 DIAGNOSIS — I872 Venous insufficiency (chronic) (peripheral): Secondary | ICD-10-CM | POA: Diagnosis not present

## 2022-04-30 MED ORDER — CEFTRIAXONE SODIUM 1 G IJ SOLR
1.0000 g | Freq: Once | INTRAMUSCULAR | Status: AC
  Administered 2022-04-30: 1 g via INTRAMUSCULAR

## 2022-04-30 MED ORDER — ONDANSETRON 4 MG PO TBDP: 4.0000 mg | ORAL_TABLET | Freq: Three times a day (TID) | ORAL | 1 refills | Status: DC | PRN

## 2022-04-30 MED ORDER — SULFAMETHOXAZOLE-TRIMETHOPRIM 800-160 MG PO TABS: 1.0000 | ORAL_TABLET | Freq: Two times a day (BID) | ORAL | 0 refills | Status: DC

## 2022-04-30 NOTE — Progress Notes (Addendum)
Patient ID: [name], male   DOB: 26-Nov-1945, 76 y.o.   MRN: [id number]        Chief Complaint: follow up return of urinary symptoms, n/v, and bradycardia       HPI:  [name] is a 76 y.o. male with hx of UTI/pyelonephritis hospd apri 2022, now with 3 days onset urinary bad smell, decreased volume, darker and cloudy color per daughter, low appetite, and recurrent n/v the night before last, with only nausea since then.  Last BM x 2 days, but passing gas.  Denies high fever, chills, and Denies urinary symptoms such as dysuria, frequency, urgency, flank pain, hematuria.  Denies worsening reflux, dysphagia, or rectal bleeding.  Has recent borderline bradycardia per cardiology, today with initial HR 39.  Pt in wheelchair, difficult to stand.  Decliens ED referral for now.  Has cardiology f/u nov 14.        Wt Readings from Last 3 Encounters:  04/17/22 257 lb (116.6 kg)  04/02/22 257 lb (116.6 kg)  03/10/22 261 lb (118.4 kg)   BP Readings from Last 3 Encounters:  04/30/22 118/62  04/23/22 (!) 144/72  04/20/22 (!) 171/91         Past Medical History:  Diagnosis Date   Adenoma 05/2008   Alcoholism in recovery [facility])    BPH (benign prostatic hyperplasia)    Cancer (HCC)    Prostate   Chronic LBP    Hip & Back -- Sees Dr. Maia Petties @ [place]   CKD (chronic kidney disease)    with AKI requiring brief CRRT 02/2020   Colon polyps    Complex renal cyst 12/2010   Controlled type 2 diabetes mellitus with neuropathy ([place]) 01/2007   COPD (chronic obstructive pulmonary disease) ([place])    Diabetes ([place])    Diverticulitis of colon    ED (erectile dysfunction)    s/p Penile prosthesis (09/2010)   History of prostate cancer    Dr. Karsten Ro   History of sick sinus syndrome    reduced BB dose for Bradycardia   HLD (hyperlipidemia)    Hypertension    Impaired glucose tolerance 12/21/2013   Nephrolithiasis    Osteoarthritis of both hips    Osteoarthritis of  lumbosacral spine    with Disc Disease   PAF (paroxysmal atrial fibrillation) (HCC)    No longer on Amiodarone.  Not on Anticoaguation b/c no recurrence.   Paresthesias/numbness    Bilateral LE   Peripheral neuropathy 12/21/2013   Stroke [facility])    left occipital CVA ~ 2018; possible TIA 01/24/22   Venous insufficiency    Past Surgical History:  Procedure Laterality Date   IR ANGIO INTRA EXTRACRAN SEL COM CAROTID INNOMINATE UNI R MOD SED  04/17/2022   LEFT HEART CATH AND CORONARY ANGIOGRAPHY  2003   Normal Coronary Arteries.   NM MYOVIEW LTD  02/21/2020   EF 60%.  Medium sized mild severity defect in the basal inferior, mid inferior and apical inferior location-suggesting of ischemia.  Read as low-intermediate risk.  (On cardiology review this is felt to be a fixed defect either related to prior infarct versus diaphragmatic attenuation.  Felt to be low risk)   PENILE PROSTHESIS IMPLANT  06/21/2012   Procedure: PENILE PROTHESIS INFLATABLE;  Surgeon: Claybon Jabs, MD;  Location: [facility];  Service: Urology;  Laterality: N/A;  REMOVAL AND REPLACEMENT OF SOME  OF PROSTHESIS ([facility])    PENILE PROSTHESIS PLACEMENT  09/2010   PROSTATECTOMY  RADIOLOGY WITH ANESTHESIA N/A 04/17/2022   Procedure: Right middle meningeal artery embolization;  Surgeon: Consuella Lose, MD;  Location: [facility];  Service: Radiology;  Laterality: N/A;   REMOVAL OF PENILE PROSTHESIS N/A 02/15/2018   Procedure: REMOVAL OF PENILE PROSTHESIS;  Surgeon: Kathie Rhodes, MD;  Location: [facility];  Service: Urology;  Laterality: N/A;   TRANSTHORACIC ECHOCARDIOGRAM  04/2016   a) Relatively normal EF of 55-60%. No RWMA suggesting no prior MI. Gr 2 DD (pseudo-normal filling pattern) along with moderately dilated left atrium.;; b) 02/2018: Mod LVH. EF 65-70%. No RWMA.  Unable to assess DF 2/2 Afib. Mild MR. Severe BiAtrial Enlargement. High CVP.   TRANSTHORACIC ECHOCARDIOGRAM  02/17/2020   EF 60 to 65%.  No R WMA.  Unable to  assess diastolic parameters because of A. fib.  Mildly elevated PA pressures.  Mild LA dilation.  Mild aortic valve sclerosis but no stenosis.  Normal IVC.    reports that he quit smoking about 39 years ago. His smoking use included cigarettes. He has never used smokeless tobacco. He reports that he does not currently use alcohol. He reports that he does not use drugs. family history includes Coronary artery disease in his father and mother; Diabetes in his father; Heart attack in his mother; Prostate cancer in his father. Allergies  Allergen Reactions   Ciprofloxacin Other (See Comments)    All over weakness   Current Outpatient Medications on File Prior to Visit  Medication Sig Dispense Refill   acetaminophen (TYLENOL) 500 MG tablet Take 1,000 mg by mouth every 8 (eight) hours as needed for headache.     acetaminophen (TYLENOL) 650 MG CR tablet Take 1,300 mg by mouth every 8 (eight) hours as needed for pain.     Blood Glucose Monitoring Suppl (ACCU-CHEK GUIDE ME) w/Device KIT Use as directed twice per day E11.9 1 kit 0   carvedilol (COREG) 3.125 MG tablet Take 3.125 mg by mouth 2 (two) times daily with a meal.     diltiazem (CARDIZEM LA) 120 MG 24 hr tablet Take 1 tablet (120 mg total) by mouth daily. 90 tablet 3   furosemide (LASIX) 40 MG tablet TAKE 1 TABLET BY MOUTH DAILY .  MAY TAKE AN ADDITIONAL 1 TABLET  BY MOUTH IF NEEDED FOR SWELLING 200 tablet 1   glucose blood (ACCU-CHEK GUIDE) test strip Use as instructed twice per day E11.9 200 each 12   Lancet Devices (ONETOUCH DELICA PLUS LANCING) MISC      Lancets (ONETOUCH DELICA PLUS [id number]) MISC USE ONCE DAILY 100 each 3   lidocaine (LIDODERM) 5 % Place 1 patch onto the skin daily. Remove & Discard patch within 12 hours or as directed by MD 30 patch 0   potassium chloride (KLOR-CON) 10 MEQ tablet Take 1 tablet (10 mEq total) by mouth daily. 90 tablet 3   PROAIR HFA 108 (90 Base) MCG/ACT inhaler INHALE 2 PUFFS INTO THE  LUNGS EVERY 6 HOURS  AS  NEEDED (Patient taking differently: Inhale 2 puffs into the lungs every 6 (six) hours as needed for shortness of breath or wheezing.) 34 g 3   prochlorperazine (COMPAZINE) 10 MG tablet Take 1 tablet (10 mg total) by mouth every 6 (six) hours as needed for nausea or vomiting (or headache). 20 tablet 0   rosuvastatin (CRESTOR) 20 MG tablet Take 20 mg by mouth every evening.     No current facility-administered medications on file prior to visit.        ROS:  All others reviewed and negative.  Objective        PE:  BP 118/62 (BP Location: Left Arm, Patient Position: Sitting, Cuff Size: Normal)   Pulse (!) 25   Temp 98.2 F (36.8 C) (Oral)   Ht '6\' 2"'  (1.88 m)   SpO2 (!) 74%   BMI 33.00 kg/m                 Constitutional: Pt appears in NAD, mild ill, non toxic               HENT: Head: NCAT.                Right Ear: External ear normal.                 Left Ear: External ear normal.                Eyes: . Pupils are equal, round, and reactive to light. Conjunctivae and EOM are normal               Nose: without d/c or deformity               Neck: Neck supple. Gross normal ROM               Cardiovascular: slow rate and irregular rhythm.                 Pulmonary/Chest: Effort normal and breath sounds without rales or wheezing.                Abd:  Soft, NT, ND, + BS, no organomegaly               Neurological: Pt is alert. At baseline orientation, motor grossly intact               Skin: Skin is warm. No rashes, no other new lesions, LE edema -  none               Psychiatric: Pt behavior is normal without agitation   Micro: none  Cardiac tracings I have personally interpreted today:  ECG - afib 46  Pertinent Radiological findings (summarize): none   Lab Results  Component Value Date   WBC 8.5 04/17/2022   HGB 13.8 04/17/2022   HCT 43.1 04/17/2022   PLT 239 04/17/2022   GLUCOSE 128 (H) 04/10/2022   CHOL 102 03/10/2022   TRIG 77.0 03/10/2022   HDL 29.10 (L) 03/10/2022    LDLCALC 57 03/10/2022   ALT 9 04/10/2022   AST 16 04/10/2022   NA 133 (L) 04/10/2022   K 3.9 04/10/2022   CL 100 04/10/2022   CREATININE 1.40 (H) 04/17/2022   BUN 15 04/10/2022   CO2 23 04/10/2022   TSH 1.363 01/24/2022   PSA 0.00 (L) 11/18/2021   INR 1.1 04/10/2022   HGBA1C 7.0 (H) 03/10/2022   MICROALBUR 176.5 (H) 11/18/2021   Assessment/Plan:  [name] is a 76 y.o. Black or African American [2] male with  has a past medical history of Adenoma (05/2008), Alcoholism in recovery [facility]), BPH (benign prostatic hyperplasia), Cancer ([place]), Chronic LBP, CKD (chronic kidney disease), Colon polyps, Complex renal cyst (12/2010), Controlled type 2 diabetes mellitus with neuropathy ([place]) (01/2007), COPD (chronic obstructive pulmonary disease) ([place]), Diabetes ([place]), Diverticulitis of colon, ED (erectile dysfunction), History of prostate cancer, History of sick sinus syndrome, HLD (hyperlipidemia), Hypertension, Impaired glucose tolerance (12/21/2013), Nephrolithiasis, Osteoarthritis of both hips, Osteoarthritis of lumbosacral spine, PAF (  paroxysmal atrial fibrillation) ([place]), Paresthesias/numbness, Peripheral neuropathy (12/21/2013), Stroke [facility]), and Venous insufficiency.  Permanent atrial fibrillation (HCC) - now off amiodarone; asymptomatic. CHA2DS2-VASc Score -5 (on Xarelto) I suspect mild overcontrolled, will d/c coreg for now and f/u rate and volume  Foul smelling urine High suspicion for recurrent UTI - for rocephin 1 gm I'm now, urine culture, and septra DS bid course x 10 days  Nausea and vomiting I suspect related to uti, but for abd/chest films today r/o aspirate pna  Followup: Return in about 1 week (around 05/07/2022).  Cathlean Cower, MD 04/30/2022 7:49 PM [facility]

## 2022-04-30 NOTE — Assessment & Plan Note (Signed)
I suspect related to uti, but for abd/chest films today r/o aspirate pna

## 2022-04-30 NOTE — Patient Instructions (Addendum)
You had the antibiotic shot today (rocephin)  Please take all new medication as prescribed - the pill antibiotic, and nausea medication  Ok to STOP the coreg 3.125 mg   Please continue all other medications as before, and refills have been done if requested.  Please have the pharmacy call with any other refills you may need.  Please keep your appointments with your specialists as you may have planned  Please go to the XRAY Department in the first floor for the x-ray testing  Please go to the LAB at the blood drawing area for the tests to be done  You will be contacted by phone if any changes need to be made immediately.  Otherwise, you will receive a letter about your results with an explanation, but please check with MyChart first.  Please remember to sign up for MyChart if you have not done so, as this will be important to you in the future with finding out test results, communicating by private email, and scheduling acute appointments online when needed.  Please make an Appointment to return in 1 week, or sooner if needed

## 2022-04-30 NOTE — Assessment & Plan Note (Signed)
High suspicion for recurrent UTI - for rocephin 1 gm I'm now, urine culture, and septra DS bid course x 10 days

## 2022-04-30 NOTE — Assessment & Plan Note (Signed)
I suspect mild overcontrolled, will d/c coreg for now and f/u rate and volume

## 2022-05-01 ENCOUNTER — Encounter: Payer: Self-pay | Admitting: Internal Medicine

## 2022-05-01 LAB — BASIC METABOLIC PANEL
BUN: 22 mg/dL (ref 6–23)
CO2: 26 mEq/L (ref 19–32)
Calcium: 9.1 mg/dL (ref 8.4–10.5)
Chloride: 101 mEq/L (ref 96–112)
Creatinine, Ser: 1.56 mg/dL — ABNORMAL HIGH (ref 0.40–1.50)
GFR: 43.22 mL/min — ABNORMAL LOW (ref >60.00)
Glucose, Bld: 102 mg/dL — ABNORMAL HIGH (ref 70–99)
Potassium: 4.2 mEq/L (ref 3.5–5.1)
Sodium: 136 mEq/L (ref 135–145)

## 2022-05-01 LAB — URINALYSIS, ROUTINE W REFLEX MICROSCOPIC
Bilirubin Urine: NEGATIVE
Ketones, ur: NEGATIVE
Leukocytes,Ua: NEGATIVE
Nitrite: NEGATIVE
Specific Gravity, Urine: 1.015 (ref 1.000–1.030)
Total Protein, Urine: 100 — AB
Urine Glucose: NEGATIVE
Urobilinogen, UA: 0.2 (ref 0.0–1.0)
pH: 6 (ref 5.0–8.0)

## 2022-05-01 LAB — CBC WITH DIFFERENTIAL/PLATELET
Basophils Absolute: 0 10*3/uL (ref 0.0–0.1)
Basophils Relative: 0.4 % (ref 0.0–3.0)
Eosinophils Absolute: 0.1 10*3/uL (ref 0.0–0.7)
Eosinophils Relative: 0.8 % (ref 0.0–5.0)
HCT: 42.5 % (ref 39.0–52.0)
Hemoglobin: 13.9 g/dL (ref 13.0–17.0)
Lymphocytes Relative: 21.4 % (ref 12.0–46.0)
Lymphs Abs: 1.6 10*3/uL (ref 0.7–4.0)
MCHC: 32.8 g/dL (ref 30.0–36.0)
MCV: 84.8 fl (ref 78.0–100.0)
Monocytes Absolute: 0.7 10*3/uL (ref 0.1–1.0)
Monocytes Relative: 9.3 % (ref 3.0–12.0)
Neutro Abs: 5.1 10*3/uL (ref 1.4–7.7)
Neutrophils Relative %: 68.1 % (ref 43.0–77.0)
Platelets: 166 10*3/uL (ref 150.0–400.0)
RBC: 5.01 Mil/uL (ref 4.22–5.81)
RDW: 16.2 % — ABNORMAL HIGH (ref 11.5–15.5)
WBC: 7.4 10*3/uL (ref 4.0–10.5)

## 2022-05-01 LAB — HEPATIC FUNCTION PANEL
ALT: 12 U/L (ref 0–53)
AST: 20 U/L (ref 0–37)
Albumin: 3.2 g/dL — ABNORMAL LOW (ref 3.5–5.2)
Alkaline Phosphatase: 137 U/L — ABNORMAL HIGH (ref 39–117)
Bilirubin, Direct: 0.6 mg/dL — ABNORMAL HIGH (ref 0.0–0.3)
Total Bilirubin: 0.5 mg/dL (ref 0.2–1.2)
Total Protein: 8.3 g/dL (ref 6.0–8.3)

## 2022-05-01 LAB — URINE CULTURE: Result:: NO GROWTH

## 2022-05-07 ENCOUNTER — Ambulatory Visit (HOSPITAL_COMMUNITY)
Admission: RE | Admit: 2022-05-07 | Discharge: 2022-05-07 | Disposition: A | Discharge status: Home / Self Care | Payer: Medicare Other | Source: Ambulatory Visit | Attending: Neurological Surgery | Admitting: Neurological Surgery

## 2022-05-07 DIAGNOSIS — I872 Venous insufficiency (chronic) (peripheral): Secondary | ICD-10-CM | POA: Diagnosis not present

## 2022-05-07 DIAGNOSIS — I495 Sick sinus syndrome: Secondary | ICD-10-CM | POA: Diagnosis not present

## 2022-05-07 DIAGNOSIS — G8929 Other chronic pain: Secondary | ICD-10-CM | POA: Diagnosis not present

## 2022-05-07 DIAGNOSIS — S065XAA Traumatic subdural hemorrhage with loss of consciousness status unknown, initial encounter: Secondary | ICD-10-CM | POA: Insufficient documentation

## 2022-05-07 DIAGNOSIS — E1142 Type 2 diabetes mellitus with diabetic polyneuropathy: Secondary | ICD-10-CM | POA: Diagnosis not present

## 2022-05-07 DIAGNOSIS — M160 Bilateral primary osteoarthritis of hip: Secondary | ICD-10-CM | POA: Diagnosis not present

## 2022-05-07 DIAGNOSIS — Z7901 Long term (current) use of anticoagulants: Secondary | ICD-10-CM | POA: Diagnosis not present

## 2022-05-07 DIAGNOSIS — R634 Abnormal weight loss: Secondary | ICD-10-CM | POA: Diagnosis not present

## 2022-05-07 DIAGNOSIS — S065X0D Traumatic subdural hemorrhage without loss of consciousness, subsequent encounter: Secondary | ICD-10-CM | POA: Diagnosis not present

## 2022-05-07 DIAGNOSIS — I5032 Chronic diastolic (congestive) heart failure: Secondary | ICD-10-CM | POA: Diagnosis not present

## 2022-05-07 DIAGNOSIS — E785 Hyperlipidemia, unspecified: Secondary | ICD-10-CM | POA: Diagnosis not present

## 2022-05-07 DIAGNOSIS — I4729 Other ventricular tachycardia: Secondary | ICD-10-CM | POA: Diagnosis not present

## 2022-05-07 DIAGNOSIS — I4821 Permanent atrial fibrillation: Secondary | ICD-10-CM | POA: Diagnosis not present

## 2022-05-07 DIAGNOSIS — N1832 Chronic kidney disease, stage 3b: Secondary | ICD-10-CM | POA: Diagnosis not present

## 2022-05-07 DIAGNOSIS — I69351 Hemiplegia and hemiparesis following cerebral infarction affecting right dominant side: Secondary | ICD-10-CM | POA: Diagnosis not present

## 2022-05-07 DIAGNOSIS — C9000 Multiple myeloma not having achieved remission: Secondary | ICD-10-CM | POA: Diagnosis not present

## 2022-05-07 DIAGNOSIS — M47812 Spondylosis without myelopathy or radiculopathy, cervical region: Secondary | ICD-10-CM | POA: Diagnosis not present

## 2022-05-07 DIAGNOSIS — I130 Hypertensive heart and chronic kidney disease with heart failure and stage 1 through stage 4 chronic kidney disease, or unspecified chronic kidney disease: Secondary | ICD-10-CM | POA: Diagnosis not present

## 2022-05-07 DIAGNOSIS — K5730 Diverticulosis of large intestine without perforation or abscess without bleeding: Secondary | ICD-10-CM | POA: Diagnosis not present

## 2022-05-07 DIAGNOSIS — E1151 Type 2 diabetes mellitus with diabetic peripheral angiopathy without gangrene: Secondary | ICD-10-CM | POA: Diagnosis not present

## 2022-05-07 DIAGNOSIS — E1122 Type 2 diabetes mellitus with diabetic chronic kidney disease: Secondary | ICD-10-CM | POA: Diagnosis not present

## 2022-05-07 DIAGNOSIS — I69322 Dysarthria following cerebral infarction: Secondary | ICD-10-CM | POA: Diagnosis not present

## 2022-05-07 DIAGNOSIS — J449 Chronic obstructive pulmonary disease, unspecified: Secondary | ICD-10-CM | POA: Diagnosis not present

## 2022-05-07 DIAGNOSIS — I6200 Nontraumatic subdural hemorrhage, unspecified: Secondary | ICD-10-CM | POA: Diagnosis not present

## 2022-05-08 DIAGNOSIS — I69322 Dysarthria following cerebral infarction: Secondary | ICD-10-CM | POA: Diagnosis not present

## 2022-05-08 DIAGNOSIS — E785 Hyperlipidemia, unspecified: Secondary | ICD-10-CM | POA: Diagnosis not present

## 2022-05-08 DIAGNOSIS — I4821 Permanent atrial fibrillation: Secondary | ICD-10-CM | POA: Diagnosis not present

## 2022-05-08 DIAGNOSIS — S065X0D Traumatic subdural hemorrhage without loss of consciousness, subsequent encounter: Secondary | ICD-10-CM | POA: Diagnosis not present

## 2022-05-08 DIAGNOSIS — J449 Chronic obstructive pulmonary disease, unspecified: Secondary | ICD-10-CM | POA: Diagnosis not present

## 2022-05-08 DIAGNOSIS — K5730 Diverticulosis of large intestine without perforation or abscess without bleeding: Secondary | ICD-10-CM | POA: Diagnosis not present

## 2022-05-08 DIAGNOSIS — I4729 Other ventricular tachycardia: Secondary | ICD-10-CM | POA: Diagnosis not present

## 2022-05-08 DIAGNOSIS — E1151 Type 2 diabetes mellitus with diabetic peripheral angiopathy without gangrene: Secondary | ICD-10-CM | POA: Diagnosis not present

## 2022-05-08 DIAGNOSIS — R634 Abnormal weight loss: Secondary | ICD-10-CM | POA: Diagnosis not present

## 2022-05-08 DIAGNOSIS — I5032 Chronic diastolic (congestive) heart failure: Secondary | ICD-10-CM | POA: Diagnosis not present

## 2022-05-08 DIAGNOSIS — N1832 Chronic kidney disease, stage 3b: Secondary | ICD-10-CM | POA: Diagnosis not present

## 2022-05-08 DIAGNOSIS — M160 Bilateral primary osteoarthritis of hip: Secondary | ICD-10-CM | POA: Diagnosis not present

## 2022-05-08 DIAGNOSIS — I69351 Hemiplegia and hemiparesis following cerebral infarction affecting right dominant side: Secondary | ICD-10-CM | POA: Diagnosis not present

## 2022-05-08 DIAGNOSIS — I130 Hypertensive heart and chronic kidney disease with heart failure and stage 1 through stage 4 chronic kidney disease, or unspecified chronic kidney disease: Secondary | ICD-10-CM | POA: Diagnosis not present

## 2022-05-08 DIAGNOSIS — S065XAA Traumatic subdural hemorrhage with loss of consciousness status unknown, initial encounter: Secondary | ICD-10-CM | POA: Diagnosis not present

## 2022-05-08 DIAGNOSIS — I495 Sick sinus syndrome: Secondary | ICD-10-CM | POA: Diagnosis not present

## 2022-05-08 DIAGNOSIS — Z7901 Long term (current) use of anticoagulants: Secondary | ICD-10-CM | POA: Diagnosis not present

## 2022-05-08 DIAGNOSIS — E1142 Type 2 diabetes mellitus with diabetic polyneuropathy: Secondary | ICD-10-CM | POA: Diagnosis not present

## 2022-05-08 DIAGNOSIS — E1122 Type 2 diabetes mellitus with diabetic chronic kidney disease: Secondary | ICD-10-CM | POA: Diagnosis not present

## 2022-05-08 DIAGNOSIS — C9000 Multiple myeloma not having achieved remission: Secondary | ICD-10-CM | POA: Diagnosis not present

## 2022-05-08 DIAGNOSIS — M47812 Spondylosis without myelopathy or radiculopathy, cervical region: Secondary | ICD-10-CM | POA: Diagnosis not present

## 2022-05-08 DIAGNOSIS — I872 Venous insufficiency (chronic) (peripheral): Secondary | ICD-10-CM | POA: Diagnosis not present

## 2022-05-08 DIAGNOSIS — G8929 Other chronic pain: Secondary | ICD-10-CM | POA: Diagnosis not present

## 2022-05-09 ENCOUNTER — Ambulatory Visit (HOSPITAL_BASED_OUTPATIENT_CLINIC_OR_DEPARTMENT_OTHER): Payer: Medicare Other

## 2022-05-09 ENCOUNTER — Other Ambulatory Visit (HOSPITAL_BASED_OUTPATIENT_CLINIC_OR_DEPARTMENT_OTHER): Payer: Medicare Other

## 2022-05-12 ENCOUNTER — Other Ambulatory Visit (HOSPITAL_BASED_OUTPATIENT_CLINIC_OR_DEPARTMENT_OTHER): Payer: Medicare Other

## 2022-05-13 DIAGNOSIS — E1142 Type 2 diabetes mellitus with diabetic polyneuropathy: Secondary | ICD-10-CM | POA: Diagnosis not present

## 2022-05-13 DIAGNOSIS — G8929 Other chronic pain: Secondary | ICD-10-CM | POA: Diagnosis not present

## 2022-05-13 DIAGNOSIS — K5730 Diverticulosis of large intestine without perforation or abscess without bleeding: Secondary | ICD-10-CM | POA: Diagnosis not present

## 2022-05-13 DIAGNOSIS — I495 Sick sinus syndrome: Secondary | ICD-10-CM | POA: Diagnosis not present

## 2022-05-13 DIAGNOSIS — I69351 Hemiplegia and hemiparesis following cerebral infarction affecting right dominant side: Secondary | ICD-10-CM | POA: Diagnosis not present

## 2022-05-13 DIAGNOSIS — I872 Venous insufficiency (chronic) (peripheral): Secondary | ICD-10-CM | POA: Diagnosis not present

## 2022-05-13 DIAGNOSIS — M160 Bilateral primary osteoarthritis of hip: Secondary | ICD-10-CM | POA: Diagnosis not present

## 2022-05-13 DIAGNOSIS — M47812 Spondylosis without myelopathy or radiculopathy, cervical region: Secondary | ICD-10-CM | POA: Diagnosis not present

## 2022-05-13 DIAGNOSIS — E1122 Type 2 diabetes mellitus with diabetic chronic kidney disease: Secondary | ICD-10-CM | POA: Diagnosis not present

## 2022-05-13 DIAGNOSIS — N1832 Chronic kidney disease, stage 3b: Secondary | ICD-10-CM | POA: Diagnosis not present

## 2022-05-13 DIAGNOSIS — I69322 Dysarthria following cerebral infarction: Secondary | ICD-10-CM | POA: Diagnosis not present

## 2022-05-13 DIAGNOSIS — R634 Abnormal weight loss: Secondary | ICD-10-CM | POA: Diagnosis not present

## 2022-05-13 DIAGNOSIS — I4821 Permanent atrial fibrillation: Secondary | ICD-10-CM | POA: Diagnosis not present

## 2022-05-13 DIAGNOSIS — S065X0D Traumatic subdural hemorrhage without loss of consciousness, subsequent encounter: Secondary | ICD-10-CM | POA: Diagnosis not present

## 2022-05-13 DIAGNOSIS — Z7901 Long term (current) use of anticoagulants: Secondary | ICD-10-CM | POA: Diagnosis not present

## 2022-05-13 DIAGNOSIS — I130 Hypertensive heart and chronic kidney disease with heart failure and stage 1 through stage 4 chronic kidney disease, or unspecified chronic kidney disease: Secondary | ICD-10-CM | POA: Diagnosis not present

## 2022-05-13 DIAGNOSIS — I5032 Chronic diastolic (congestive) heart failure: Secondary | ICD-10-CM | POA: Diagnosis not present

## 2022-05-13 DIAGNOSIS — J449 Chronic obstructive pulmonary disease, unspecified: Secondary | ICD-10-CM | POA: Diagnosis not present

## 2022-05-13 DIAGNOSIS — C9000 Multiple myeloma not having achieved remission: Secondary | ICD-10-CM | POA: Diagnosis not present

## 2022-05-13 DIAGNOSIS — I4729 Other ventricular tachycardia: Secondary | ICD-10-CM | POA: Diagnosis not present

## 2022-05-13 DIAGNOSIS — E1151 Type 2 diabetes mellitus with diabetic peripheral angiopathy without gangrene: Secondary | ICD-10-CM | POA: Diagnosis not present

## 2022-05-13 DIAGNOSIS — E785 Hyperlipidemia, unspecified: Secondary | ICD-10-CM | POA: Diagnosis not present

## 2022-05-14 ENCOUNTER — Telehealth: Payer: Self-pay | Admitting: Internal Medicine

## 2022-05-14 DIAGNOSIS — S065X0D Traumatic subdural hemorrhage without loss of consciousness, subsequent encounter: Secondary | ICD-10-CM | POA: Diagnosis not present

## 2022-05-14 DIAGNOSIS — Z7901 Long term (current) use of anticoagulants: Secondary | ICD-10-CM | POA: Diagnosis not present

## 2022-05-14 DIAGNOSIS — N1832 Chronic kidney disease, stage 3b: Secondary | ICD-10-CM | POA: Diagnosis not present

## 2022-05-14 DIAGNOSIS — J449 Chronic obstructive pulmonary disease, unspecified: Secondary | ICD-10-CM | POA: Diagnosis not present

## 2022-05-14 DIAGNOSIS — R634 Abnormal weight loss: Secondary | ICD-10-CM | POA: Diagnosis not present

## 2022-05-14 DIAGNOSIS — M160 Bilateral primary osteoarthritis of hip: Secondary | ICD-10-CM | POA: Diagnosis not present

## 2022-05-14 DIAGNOSIS — I4729 Other ventricular tachycardia: Secondary | ICD-10-CM | POA: Diagnosis not present

## 2022-05-14 DIAGNOSIS — G8929 Other chronic pain: Secondary | ICD-10-CM | POA: Diagnosis not present

## 2022-05-14 DIAGNOSIS — E1151 Type 2 diabetes mellitus with diabetic peripheral angiopathy without gangrene: Secondary | ICD-10-CM | POA: Diagnosis not present

## 2022-05-14 DIAGNOSIS — I69351 Hemiplegia and hemiparesis following cerebral infarction affecting right dominant side: Secondary | ICD-10-CM | POA: Diagnosis not present

## 2022-05-14 DIAGNOSIS — M47812 Spondylosis without myelopathy or radiculopathy, cervical region: Secondary | ICD-10-CM | POA: Diagnosis not present

## 2022-05-14 DIAGNOSIS — I130 Hypertensive heart and chronic kidney disease with heart failure and stage 1 through stage 4 chronic kidney disease, or unspecified chronic kidney disease: Secondary | ICD-10-CM | POA: Diagnosis not present

## 2022-05-14 DIAGNOSIS — K5730 Diverticulosis of large intestine without perforation or abscess without bleeding: Secondary | ICD-10-CM | POA: Diagnosis not present

## 2022-05-14 DIAGNOSIS — I5032 Chronic diastolic (congestive) heart failure: Secondary | ICD-10-CM | POA: Diagnosis not present

## 2022-05-14 DIAGNOSIS — I872 Venous insufficiency (chronic) (peripheral): Secondary | ICD-10-CM | POA: Diagnosis not present

## 2022-05-14 DIAGNOSIS — E1122 Type 2 diabetes mellitus with diabetic chronic kidney disease: Secondary | ICD-10-CM | POA: Diagnosis not present

## 2022-05-14 DIAGNOSIS — I495 Sick sinus syndrome: Secondary | ICD-10-CM | POA: Diagnosis not present

## 2022-05-14 DIAGNOSIS — E1142 Type 2 diabetes mellitus with diabetic polyneuropathy: Secondary | ICD-10-CM | POA: Diagnosis not present

## 2022-05-14 DIAGNOSIS — E785 Hyperlipidemia, unspecified: Secondary | ICD-10-CM | POA: Diagnosis not present

## 2022-05-14 DIAGNOSIS — I69322 Dysarthria following cerebral infarction: Secondary | ICD-10-CM | POA: Diagnosis not present

## 2022-05-14 DIAGNOSIS — C9000 Multiple myeloma not having achieved remission: Secondary | ICD-10-CM | POA: Diagnosis not present

## 2022-05-14 DIAGNOSIS — I4821 Permanent atrial fibrillation: Secondary | ICD-10-CM | POA: Diagnosis not present

## 2022-05-14 NOTE — Telephone Encounter (Signed)
[name] with [facility] called to report she is providing OT and is requesting a VERBAL ORDER for PT Evaluation.. Please call [name] and leave secure voicemail "doctor approves physical therapy evaluation", with caller name and title.

## 2022-05-14 NOTE — Telephone Encounter (Signed)
Ok for verbal 

## 2022-05-14 NOTE — Telephone Encounter (Signed)
Please advise verbal orders for PT evaluation

## 2022-05-14 NOTE — Telephone Encounter (Signed)
Left message for Lysbeth Galas for verbal orders

## 2022-05-15 DIAGNOSIS — E119 Type 2 diabetes mellitus without complications: Secondary | ICD-10-CM | POA: Diagnosis not present

## 2022-05-15 DIAGNOSIS — J449 Chronic obstructive pulmonary disease, unspecified: Secondary | ICD-10-CM | POA: Diagnosis not present

## 2022-05-16 DIAGNOSIS — I69322 Dysarthria following cerebral infarction: Secondary | ICD-10-CM | POA: Diagnosis not present

## 2022-05-16 DIAGNOSIS — E1151 Type 2 diabetes mellitus with diabetic peripheral angiopathy without gangrene: Secondary | ICD-10-CM | POA: Diagnosis not present

## 2022-05-16 DIAGNOSIS — N1832 Chronic kidney disease, stage 3b: Secondary | ICD-10-CM | POA: Diagnosis not present

## 2022-05-16 DIAGNOSIS — I4821 Permanent atrial fibrillation: Secondary | ICD-10-CM | POA: Diagnosis not present

## 2022-05-16 DIAGNOSIS — Z7901 Long term (current) use of anticoagulants: Secondary | ICD-10-CM | POA: Diagnosis not present

## 2022-05-16 DIAGNOSIS — I872 Venous insufficiency (chronic) (peripheral): Secondary | ICD-10-CM | POA: Diagnosis not present

## 2022-05-16 DIAGNOSIS — E1122 Type 2 diabetes mellitus with diabetic chronic kidney disease: Secondary | ICD-10-CM | POA: Diagnosis not present

## 2022-05-16 DIAGNOSIS — I130 Hypertensive heart and chronic kidney disease with heart failure and stage 1 through stage 4 chronic kidney disease, or unspecified chronic kidney disease: Secondary | ICD-10-CM | POA: Diagnosis not present

## 2022-05-16 DIAGNOSIS — K5730 Diverticulosis of large intestine without perforation or abscess without bleeding: Secondary | ICD-10-CM | POA: Diagnosis not present

## 2022-05-16 DIAGNOSIS — I4729 Other ventricular tachycardia: Secondary | ICD-10-CM | POA: Diagnosis not present

## 2022-05-16 DIAGNOSIS — I5032 Chronic diastolic (congestive) heart failure: Secondary | ICD-10-CM | POA: Diagnosis not present

## 2022-05-16 DIAGNOSIS — E785 Hyperlipidemia, unspecified: Secondary | ICD-10-CM | POA: Diagnosis not present

## 2022-05-16 DIAGNOSIS — R634 Abnormal weight loss: Secondary | ICD-10-CM | POA: Diagnosis not present

## 2022-05-16 DIAGNOSIS — S065X0D Traumatic subdural hemorrhage without loss of consciousness, subsequent encounter: Secondary | ICD-10-CM | POA: Diagnosis not present

## 2022-05-16 DIAGNOSIS — I495 Sick sinus syndrome: Secondary | ICD-10-CM | POA: Diagnosis not present

## 2022-05-16 DIAGNOSIS — I69351 Hemiplegia and hemiparesis following cerebral infarction affecting right dominant side: Secondary | ICD-10-CM | POA: Diagnosis not present

## 2022-05-16 DIAGNOSIS — M160 Bilateral primary osteoarthritis of hip: Secondary | ICD-10-CM | POA: Diagnosis not present

## 2022-05-16 DIAGNOSIS — E1142 Type 2 diabetes mellitus with diabetic polyneuropathy: Secondary | ICD-10-CM | POA: Diagnosis not present

## 2022-05-16 DIAGNOSIS — G8929 Other chronic pain: Secondary | ICD-10-CM | POA: Diagnosis not present

## 2022-05-16 DIAGNOSIS — M47812 Spondylosis without myelopathy or radiculopathy, cervical region: Secondary | ICD-10-CM | POA: Diagnosis not present

## 2022-05-16 DIAGNOSIS — C9000 Multiple myeloma not having achieved remission: Secondary | ICD-10-CM | POA: Diagnosis not present

## 2022-05-16 DIAGNOSIS — J449 Chronic obstructive pulmonary disease, unspecified: Secondary | ICD-10-CM | POA: Diagnosis not present

## 2022-05-19 ENCOUNTER — Telehealth: Payer: Self-pay | Admitting: Internal Medicine

## 2022-05-19 NOTE — Telephone Encounter (Signed)
[name] at [facility] request LAST OV Note faxed to [phone] for medical supplies:  DRESSING  [name] [phone]

## 2022-05-20 DIAGNOSIS — I872 Venous insufficiency (chronic) (peripheral): Secondary | ICD-10-CM | POA: Diagnosis not present

## 2022-05-20 DIAGNOSIS — K5730 Diverticulosis of large intestine without perforation or abscess without bleeding: Secondary | ICD-10-CM | POA: Diagnosis not present

## 2022-05-20 DIAGNOSIS — R634 Abnormal weight loss: Secondary | ICD-10-CM | POA: Diagnosis not present

## 2022-05-20 DIAGNOSIS — M47812 Spondylosis without myelopathy or radiculopathy, cervical region: Secondary | ICD-10-CM | POA: Diagnosis not present

## 2022-05-20 DIAGNOSIS — I495 Sick sinus syndrome: Secondary | ICD-10-CM | POA: Diagnosis not present

## 2022-05-20 DIAGNOSIS — I69351 Hemiplegia and hemiparesis following cerebral infarction affecting right dominant side: Secondary | ICD-10-CM | POA: Diagnosis not present

## 2022-05-20 DIAGNOSIS — J449 Chronic obstructive pulmonary disease, unspecified: Secondary | ICD-10-CM | POA: Diagnosis not present

## 2022-05-20 DIAGNOSIS — E1142 Type 2 diabetes mellitus with diabetic polyneuropathy: Secondary | ICD-10-CM | POA: Diagnosis not present

## 2022-05-20 DIAGNOSIS — M160 Bilateral primary osteoarthritis of hip: Secondary | ICD-10-CM | POA: Diagnosis not present

## 2022-05-20 DIAGNOSIS — N1832 Chronic kidney disease, stage 3b: Secondary | ICD-10-CM | POA: Diagnosis not present

## 2022-05-20 DIAGNOSIS — I4729 Other ventricular tachycardia: Secondary | ICD-10-CM | POA: Diagnosis not present

## 2022-05-20 DIAGNOSIS — S065X0D Traumatic subdural hemorrhage without loss of consciousness, subsequent encounter: Secondary | ICD-10-CM | POA: Diagnosis not present

## 2022-05-20 DIAGNOSIS — C9000 Multiple myeloma not having achieved remission: Secondary | ICD-10-CM | POA: Diagnosis not present

## 2022-05-20 DIAGNOSIS — I130 Hypertensive heart and chronic kidney disease with heart failure and stage 1 through stage 4 chronic kidney disease, or unspecified chronic kidney disease: Secondary | ICD-10-CM | POA: Diagnosis not present

## 2022-05-20 DIAGNOSIS — E785 Hyperlipidemia, unspecified: Secondary | ICD-10-CM | POA: Diagnosis not present

## 2022-05-20 DIAGNOSIS — I4821 Permanent atrial fibrillation: Secondary | ICD-10-CM | POA: Diagnosis not present

## 2022-05-20 DIAGNOSIS — E1122 Type 2 diabetes mellitus with diabetic chronic kidney disease: Secondary | ICD-10-CM | POA: Diagnosis not present

## 2022-05-20 DIAGNOSIS — Z7901 Long term (current) use of anticoagulants: Secondary | ICD-10-CM | POA: Diagnosis not present

## 2022-05-20 DIAGNOSIS — G8929 Other chronic pain: Secondary | ICD-10-CM | POA: Diagnosis not present

## 2022-05-20 DIAGNOSIS — I69322 Dysarthria following cerebral infarction: Secondary | ICD-10-CM | POA: Diagnosis not present

## 2022-05-20 DIAGNOSIS — I5032 Chronic diastolic (congestive) heart failure: Secondary | ICD-10-CM | POA: Diagnosis not present

## 2022-05-20 DIAGNOSIS — E1151 Type 2 diabetes mellitus with diabetic peripheral angiopathy without gangrene: Secondary | ICD-10-CM | POA: Diagnosis not present

## 2022-05-20 NOTE — Telephone Encounter (Signed)
Ok for verbals 

## 2022-05-20 NOTE — Telephone Encounter (Signed)
Please advise for verbal orders 

## 2022-05-22 DIAGNOSIS — R634 Abnormal weight loss: Secondary | ICD-10-CM | POA: Diagnosis not present

## 2022-05-22 DIAGNOSIS — I4729 Other ventricular tachycardia: Secondary | ICD-10-CM | POA: Diagnosis not present

## 2022-05-22 DIAGNOSIS — J449 Chronic obstructive pulmonary disease, unspecified: Secondary | ICD-10-CM | POA: Diagnosis not present

## 2022-05-22 DIAGNOSIS — K5730 Diverticulosis of large intestine without perforation or abscess without bleeding: Secondary | ICD-10-CM | POA: Diagnosis not present

## 2022-05-22 DIAGNOSIS — I495 Sick sinus syndrome: Secondary | ICD-10-CM | POA: Diagnosis not present

## 2022-05-22 DIAGNOSIS — I69322 Dysarthria following cerebral infarction: Secondary | ICD-10-CM | POA: Diagnosis not present

## 2022-05-22 DIAGNOSIS — I5032 Chronic diastolic (congestive) heart failure: Secondary | ICD-10-CM | POA: Diagnosis not present

## 2022-05-22 DIAGNOSIS — M47812 Spondylosis without myelopathy or radiculopathy, cervical region: Secondary | ICD-10-CM | POA: Diagnosis not present

## 2022-05-22 DIAGNOSIS — M160 Bilateral primary osteoarthritis of hip: Secondary | ICD-10-CM | POA: Diagnosis not present

## 2022-05-22 DIAGNOSIS — I69351 Hemiplegia and hemiparesis following cerebral infarction affecting right dominant side: Secondary | ICD-10-CM | POA: Diagnosis not present

## 2022-05-22 DIAGNOSIS — N1832 Chronic kidney disease, stage 3b: Secondary | ICD-10-CM | POA: Diagnosis not present

## 2022-05-22 DIAGNOSIS — E1142 Type 2 diabetes mellitus with diabetic polyneuropathy: Secondary | ICD-10-CM | POA: Diagnosis not present

## 2022-05-22 DIAGNOSIS — G8929 Other chronic pain: Secondary | ICD-10-CM | POA: Diagnosis not present

## 2022-05-22 DIAGNOSIS — S065X0D Traumatic subdural hemorrhage without loss of consciousness, subsequent encounter: Secondary | ICD-10-CM | POA: Diagnosis not present

## 2022-05-22 DIAGNOSIS — Z7901 Long term (current) use of anticoagulants: Secondary | ICD-10-CM | POA: Diagnosis not present

## 2022-05-22 DIAGNOSIS — I130 Hypertensive heart and chronic kidney disease with heart failure and stage 1 through stage 4 chronic kidney disease, or unspecified chronic kidney disease: Secondary | ICD-10-CM | POA: Diagnosis not present

## 2022-05-22 DIAGNOSIS — I4821 Permanent atrial fibrillation: Secondary | ICD-10-CM | POA: Diagnosis not present

## 2022-05-22 DIAGNOSIS — E785 Hyperlipidemia, unspecified: Secondary | ICD-10-CM | POA: Diagnosis not present

## 2022-05-22 DIAGNOSIS — E1122 Type 2 diabetes mellitus with diabetic chronic kidney disease: Secondary | ICD-10-CM | POA: Diagnosis not present

## 2022-05-22 DIAGNOSIS — C9000 Multiple myeloma not having achieved remission: Secondary | ICD-10-CM | POA: Diagnosis not present

## 2022-05-22 DIAGNOSIS — E1151 Type 2 diabetes mellitus with diabetic peripheral angiopathy without gangrene: Secondary | ICD-10-CM | POA: Diagnosis not present

## 2022-05-22 DIAGNOSIS — I872 Venous insufficiency (chronic) (peripheral): Secondary | ICD-10-CM | POA: Diagnosis not present

## 2022-05-22 NOTE — Telephone Encounter (Signed)
Last OV note faxed to [place]

## 2022-05-23 DIAGNOSIS — E1122 Type 2 diabetes mellitus with diabetic chronic kidney disease: Secondary | ICD-10-CM | POA: Diagnosis not present

## 2022-05-23 DIAGNOSIS — K5730 Diverticulosis of large intestine without perforation or abscess without bleeding: Secondary | ICD-10-CM | POA: Diagnosis not present

## 2022-05-23 DIAGNOSIS — N1832 Chronic kidney disease, stage 3b: Secondary | ICD-10-CM | POA: Diagnosis not present

## 2022-05-23 DIAGNOSIS — C9000 Multiple myeloma not having achieved remission: Secondary | ICD-10-CM | POA: Diagnosis not present

## 2022-05-23 DIAGNOSIS — I4729 Other ventricular tachycardia: Secondary | ICD-10-CM | POA: Diagnosis not present

## 2022-05-23 DIAGNOSIS — M160 Bilateral primary osteoarthritis of hip: Secondary | ICD-10-CM | POA: Diagnosis not present

## 2022-05-23 DIAGNOSIS — I69322 Dysarthria following cerebral infarction: Secondary | ICD-10-CM | POA: Diagnosis not present

## 2022-05-23 DIAGNOSIS — G8929 Other chronic pain: Secondary | ICD-10-CM | POA: Diagnosis not present

## 2022-05-23 DIAGNOSIS — M47812 Spondylosis without myelopathy or radiculopathy, cervical region: Secondary | ICD-10-CM | POA: Diagnosis not present

## 2022-05-23 DIAGNOSIS — E1151 Type 2 diabetes mellitus with diabetic peripheral angiopathy without gangrene: Secondary | ICD-10-CM | POA: Diagnosis not present

## 2022-05-23 DIAGNOSIS — I69351 Hemiplegia and hemiparesis following cerebral infarction affecting right dominant side: Secondary | ICD-10-CM | POA: Diagnosis not present

## 2022-05-23 DIAGNOSIS — E785 Hyperlipidemia, unspecified: Secondary | ICD-10-CM | POA: Diagnosis not present

## 2022-05-23 DIAGNOSIS — I5032 Chronic diastolic (congestive) heart failure: Secondary | ICD-10-CM | POA: Diagnosis not present

## 2022-05-23 DIAGNOSIS — S065X0D Traumatic subdural hemorrhage without loss of consciousness, subsequent encounter: Secondary | ICD-10-CM | POA: Diagnosis not present

## 2022-05-23 DIAGNOSIS — I130 Hypertensive heart and chronic kidney disease with heart failure and stage 1 through stage 4 chronic kidney disease, or unspecified chronic kidney disease: Secondary | ICD-10-CM | POA: Diagnosis not present

## 2022-05-23 DIAGNOSIS — I4821 Permanent atrial fibrillation: Secondary | ICD-10-CM | POA: Diagnosis not present

## 2022-05-23 DIAGNOSIS — I495 Sick sinus syndrome: Secondary | ICD-10-CM | POA: Diagnosis not present

## 2022-05-23 DIAGNOSIS — J449 Chronic obstructive pulmonary disease, unspecified: Secondary | ICD-10-CM | POA: Diagnosis not present

## 2022-05-23 DIAGNOSIS — Z7901 Long term (current) use of anticoagulants: Secondary | ICD-10-CM | POA: Diagnosis not present

## 2022-05-23 DIAGNOSIS — R634 Abnormal weight loss: Secondary | ICD-10-CM | POA: Diagnosis not present

## 2022-05-23 DIAGNOSIS — E1142 Type 2 diabetes mellitus with diabetic polyneuropathy: Secondary | ICD-10-CM | POA: Diagnosis not present

## 2022-05-23 DIAGNOSIS — I872 Venous insufficiency (chronic) (peripheral): Secondary | ICD-10-CM | POA: Diagnosis not present

## 2022-05-26 DIAGNOSIS — S065X0D Traumatic subdural hemorrhage without loss of consciousness, subsequent encounter: Secondary | ICD-10-CM | POA: Diagnosis not present

## 2022-05-26 DIAGNOSIS — E1142 Type 2 diabetes mellitus with diabetic polyneuropathy: Secondary | ICD-10-CM | POA: Diagnosis not present

## 2022-05-26 DIAGNOSIS — I5032 Chronic diastolic (congestive) heart failure: Secondary | ICD-10-CM | POA: Diagnosis not present

## 2022-05-26 DIAGNOSIS — E1122 Type 2 diabetes mellitus with diabetic chronic kidney disease: Secondary | ICD-10-CM | POA: Diagnosis not present

## 2022-05-26 DIAGNOSIS — Z7901 Long term (current) use of anticoagulants: Secondary | ICD-10-CM | POA: Diagnosis not present

## 2022-05-26 DIAGNOSIS — M47812 Spondylosis without myelopathy or radiculopathy, cervical region: Secondary | ICD-10-CM | POA: Diagnosis not present

## 2022-05-26 DIAGNOSIS — C9000 Multiple myeloma not having achieved remission: Secondary | ICD-10-CM | POA: Diagnosis not present

## 2022-05-26 DIAGNOSIS — I4729 Other ventricular tachycardia: Secondary | ICD-10-CM | POA: Diagnosis not present

## 2022-05-26 DIAGNOSIS — I130 Hypertensive heart and chronic kidney disease with heart failure and stage 1 through stage 4 chronic kidney disease, or unspecified chronic kidney disease: Secondary | ICD-10-CM | POA: Diagnosis not present

## 2022-05-26 DIAGNOSIS — N1832 Chronic kidney disease, stage 3b: Secondary | ICD-10-CM | POA: Diagnosis not present

## 2022-05-26 DIAGNOSIS — G8929 Other chronic pain: Secondary | ICD-10-CM | POA: Diagnosis not present

## 2022-05-26 DIAGNOSIS — J449 Chronic obstructive pulmonary disease, unspecified: Secondary | ICD-10-CM | POA: Diagnosis not present

## 2022-05-26 DIAGNOSIS — R634 Abnormal weight loss: Secondary | ICD-10-CM | POA: Diagnosis not present

## 2022-05-26 DIAGNOSIS — E1151 Type 2 diabetes mellitus with diabetic peripheral angiopathy without gangrene: Secondary | ICD-10-CM | POA: Diagnosis not present

## 2022-05-26 DIAGNOSIS — M160 Bilateral primary osteoarthritis of hip: Secondary | ICD-10-CM | POA: Diagnosis not present

## 2022-05-26 DIAGNOSIS — I69351 Hemiplegia and hemiparesis following cerebral infarction affecting right dominant side: Secondary | ICD-10-CM | POA: Diagnosis not present

## 2022-05-26 DIAGNOSIS — I4821 Permanent atrial fibrillation: Secondary | ICD-10-CM | POA: Diagnosis not present

## 2022-05-26 DIAGNOSIS — E785 Hyperlipidemia, unspecified: Secondary | ICD-10-CM | POA: Diagnosis not present

## 2022-05-26 DIAGNOSIS — I872 Venous insufficiency (chronic) (peripheral): Secondary | ICD-10-CM | POA: Diagnosis not present

## 2022-05-26 DIAGNOSIS — I69322 Dysarthria following cerebral infarction: Secondary | ICD-10-CM | POA: Diagnosis not present

## 2022-05-26 DIAGNOSIS — I495 Sick sinus syndrome: Secondary | ICD-10-CM | POA: Diagnosis not present

## 2022-05-26 DIAGNOSIS — K5730 Diverticulosis of large intestine without perforation or abscess without bleeding: Secondary | ICD-10-CM | POA: Diagnosis not present

## 2022-05-27 ENCOUNTER — Other Ambulatory Visit: Payer: Self-pay | Admitting: Internal Medicine

## 2022-05-27 ENCOUNTER — Other Ambulatory Visit (HOSPITAL_BASED_OUTPATIENT_CLINIC_OR_DEPARTMENT_OTHER): Payer: Medicare Other

## 2022-05-27 DIAGNOSIS — R269 Unspecified abnormalities of gait and mobility: Secondary | ICD-10-CM

## 2022-05-27 DIAGNOSIS — R4189 Other symptoms and signs involving cognitive functions and awareness: Secondary | ICD-10-CM

## 2022-05-29 ENCOUNTER — Telehealth: Payer: Self-pay | Admitting: Internal Medicine

## 2022-05-29 ENCOUNTER — Ambulatory Visit: Payer: Medicare Other | Admitting: Podiatry

## 2022-05-29 DIAGNOSIS — E1151 Type 2 diabetes mellitus with diabetic peripheral angiopathy without gangrene: Secondary | ICD-10-CM | POA: Diagnosis not present

## 2022-05-29 DIAGNOSIS — R634 Abnormal weight loss: Secondary | ICD-10-CM | POA: Diagnosis not present

## 2022-05-29 DIAGNOSIS — M160 Bilateral primary osteoarthritis of hip: Secondary | ICD-10-CM | POA: Diagnosis not present

## 2022-05-29 DIAGNOSIS — C9000 Multiple myeloma not having achieved remission: Secondary | ICD-10-CM | POA: Diagnosis not present

## 2022-05-29 DIAGNOSIS — E1122 Type 2 diabetes mellitus with diabetic chronic kidney disease: Secondary | ICD-10-CM | POA: Diagnosis not present

## 2022-05-29 DIAGNOSIS — G8929 Other chronic pain: Secondary | ICD-10-CM | POA: Diagnosis not present

## 2022-05-29 DIAGNOSIS — I5032 Chronic diastolic (congestive) heart failure: Secondary | ICD-10-CM | POA: Diagnosis not present

## 2022-05-29 DIAGNOSIS — I872 Venous insufficiency (chronic) (peripheral): Secondary | ICD-10-CM | POA: Diagnosis not present

## 2022-05-29 DIAGNOSIS — Z7901 Long term (current) use of anticoagulants: Secondary | ICD-10-CM | POA: Diagnosis not present

## 2022-05-29 DIAGNOSIS — J449 Chronic obstructive pulmonary disease, unspecified: Secondary | ICD-10-CM | POA: Diagnosis not present

## 2022-05-29 DIAGNOSIS — I495 Sick sinus syndrome: Secondary | ICD-10-CM | POA: Diagnosis not present

## 2022-05-29 DIAGNOSIS — M47812 Spondylosis without myelopathy or radiculopathy, cervical region: Secondary | ICD-10-CM | POA: Diagnosis not present

## 2022-05-29 DIAGNOSIS — S065X0D Traumatic subdural hemorrhage without loss of consciousness, subsequent encounter: Secondary | ICD-10-CM | POA: Diagnosis not present

## 2022-05-29 DIAGNOSIS — E1142 Type 2 diabetes mellitus with diabetic polyneuropathy: Secondary | ICD-10-CM | POA: Diagnosis not present

## 2022-05-29 DIAGNOSIS — N1832 Chronic kidney disease, stage 3b: Secondary | ICD-10-CM | POA: Diagnosis not present

## 2022-05-29 DIAGNOSIS — K5730 Diverticulosis of large intestine without perforation or abscess without bleeding: Secondary | ICD-10-CM | POA: Diagnosis not present

## 2022-05-29 DIAGNOSIS — E785 Hyperlipidemia, unspecified: Secondary | ICD-10-CM | POA: Diagnosis not present

## 2022-05-29 DIAGNOSIS — I69322 Dysarthria following cerebral infarction: Secondary | ICD-10-CM | POA: Diagnosis not present

## 2022-05-29 DIAGNOSIS — I130 Hypertensive heart and chronic kidney disease with heart failure and stage 1 through stage 4 chronic kidney disease, or unspecified chronic kidney disease: Secondary | ICD-10-CM | POA: Diagnosis not present

## 2022-05-29 DIAGNOSIS — I4729 Other ventricular tachycardia: Secondary | ICD-10-CM | POA: Diagnosis not present

## 2022-05-29 DIAGNOSIS — I4821 Permanent atrial fibrillation: Secondary | ICD-10-CM | POA: Diagnosis not present

## 2022-05-29 DIAGNOSIS — I69351 Hemiplegia and hemiparesis following cerebral infarction affecting right dominant side: Secondary | ICD-10-CM | POA: Diagnosis not present

## 2022-05-29 NOTE — Telephone Encounter (Signed)
Tillie Rung from [facility] called to report that patients urine is the color of iced tea and has a smell to it.  Tillie Rung wants to know if they should get a urinalysis and do a culture.

## 2022-05-29 NOTE — Telephone Encounter (Signed)
Please advise for UA ans culture

## 2022-05-29 NOTE — Telephone Encounter (Signed)
Yes please ok to check UA and culture

## 2022-06-01 IMAGING — DX DG CHEST 1V PORT
1 series · 2 of 2 positions shown · non-contrast
Comparison: 02/15/2020.  09/08/2014.

CLINICAL DATA: Respiratory failure.

EXAM:
PORTABLE CHEST 1 VIEW

[Series 1: chest · 0.14mm/px · 2 of 2 slices shown]
[im 1/2]
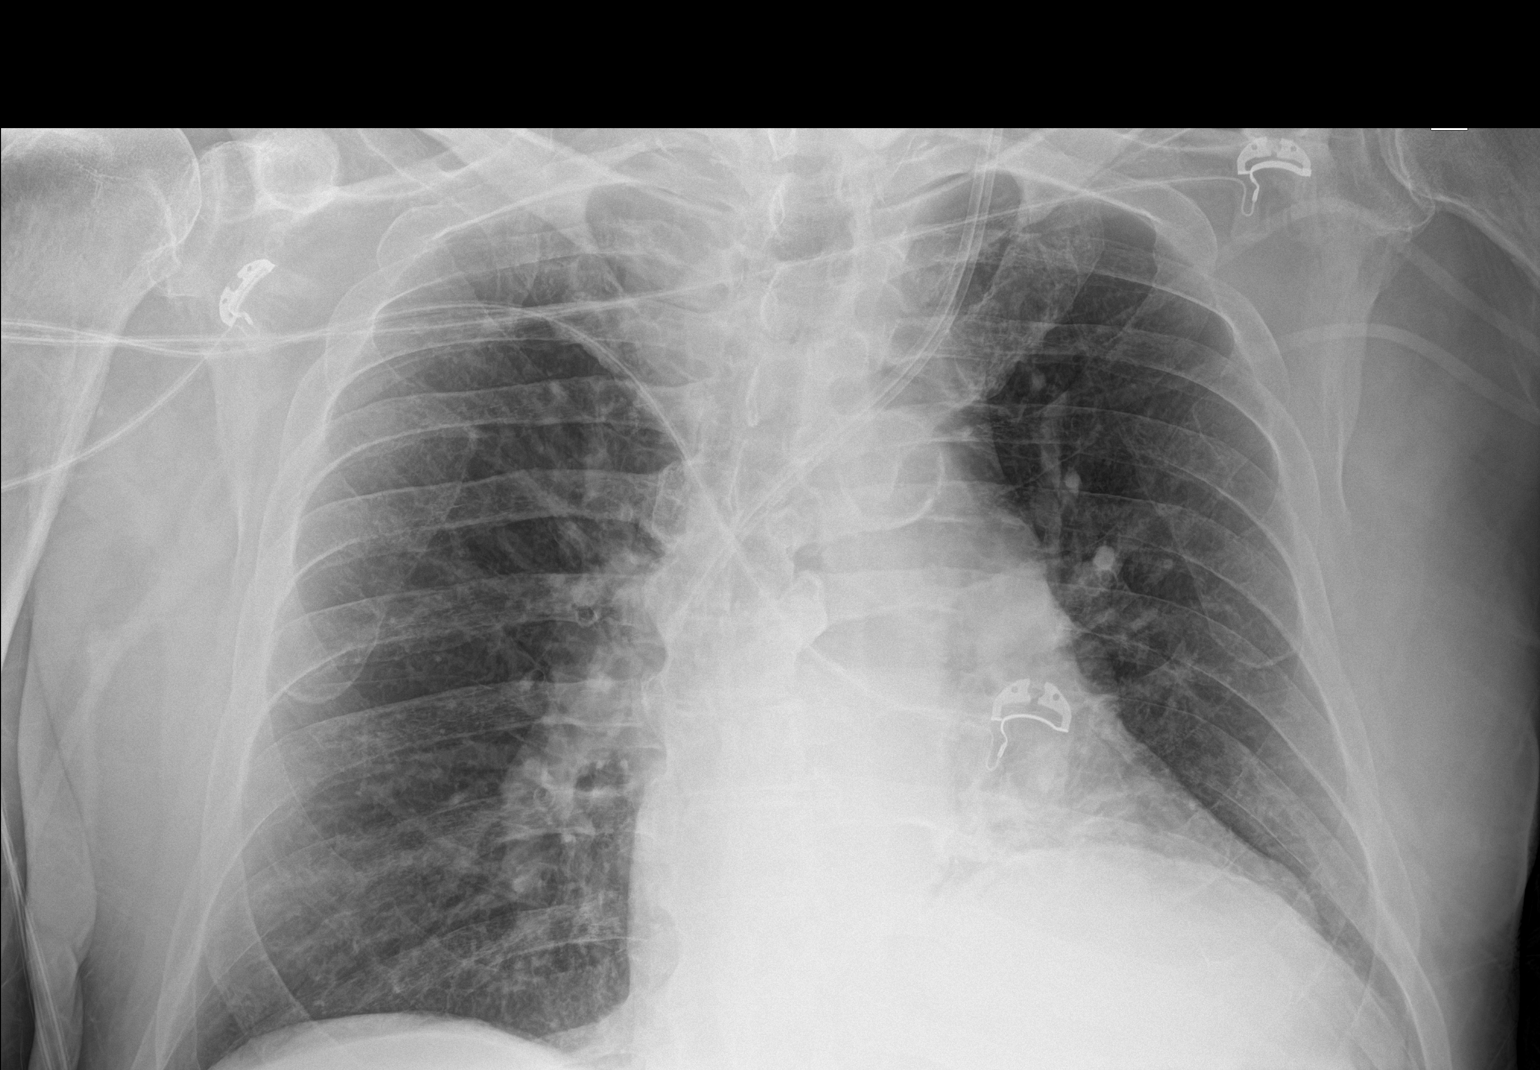
[im 2/2]
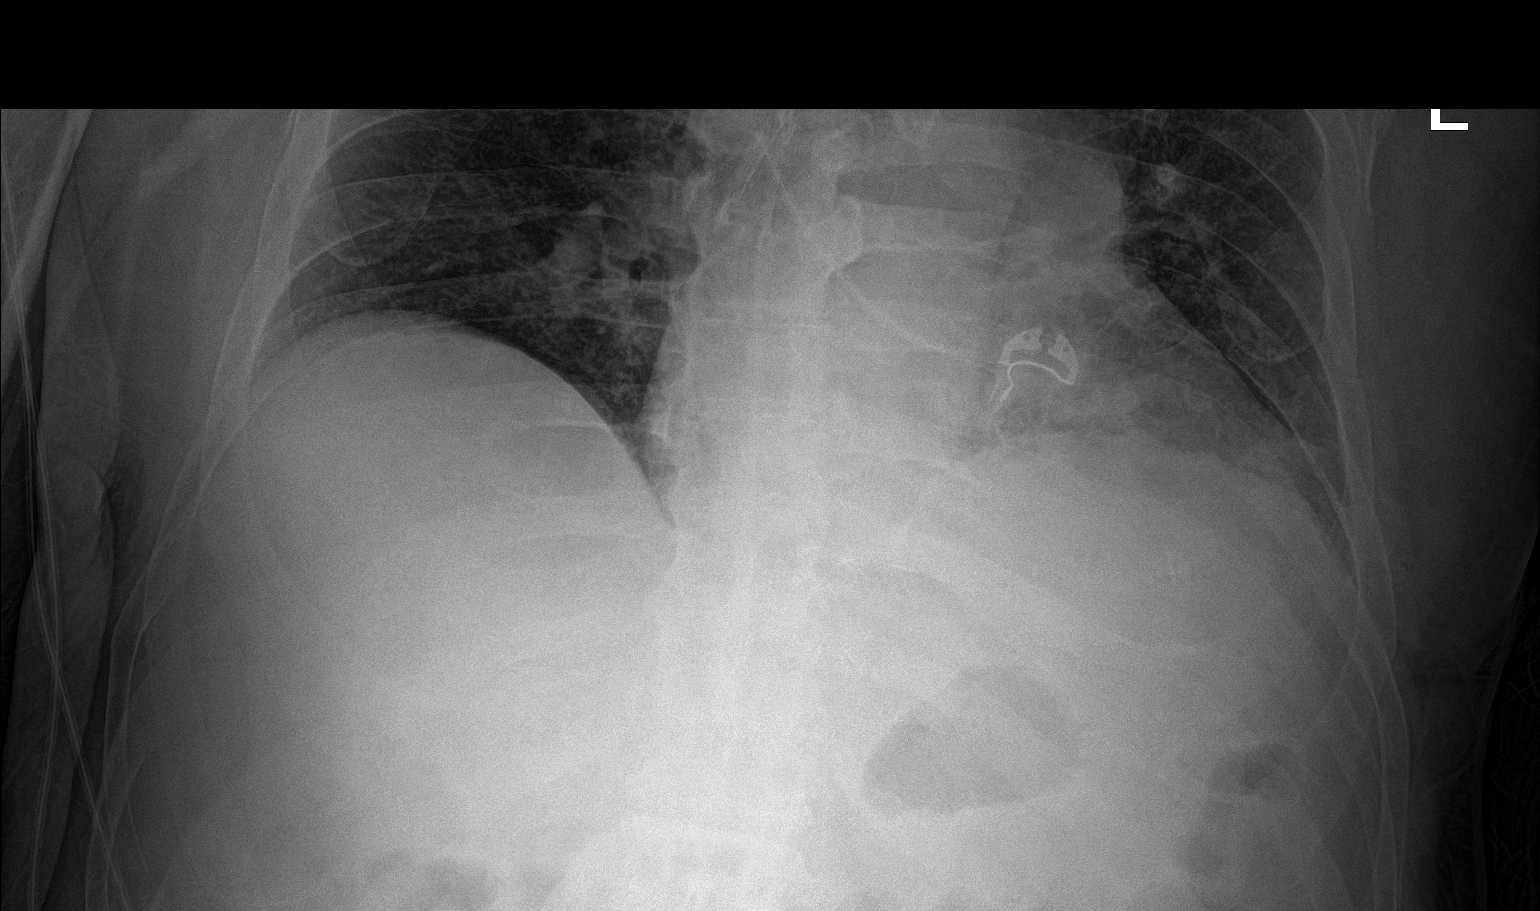

[2 of 2 positions shown; findings below may reference images not displayed]

FINDINGS: Left IJ line stable position. Heart size stable. Left base
atelectasis/scarring again noted. Mild left base infiltrate cannot
be excluded. Stable elevation left hemidiaphragm. No pleural
effusion or pneumothorax. And thoracic spine.
IMPRESSION: 1.  Left IJ line stable position.

2. Left base atelectasis/scarring again noted. Mild left base
infiltrate cannot be excluded. Stable elevation left hemidiaphragm.

## 2022-06-02 DIAGNOSIS — C9000 Multiple myeloma not having achieved remission: Secondary | ICD-10-CM | POA: Diagnosis not present

## 2022-06-02 DIAGNOSIS — I69322 Dysarthria following cerebral infarction: Secondary | ICD-10-CM | POA: Diagnosis not present

## 2022-06-02 DIAGNOSIS — J449 Chronic obstructive pulmonary disease, unspecified: Secondary | ICD-10-CM | POA: Diagnosis not present

## 2022-06-02 DIAGNOSIS — E1122 Type 2 diabetes mellitus with diabetic chronic kidney disease: Secondary | ICD-10-CM | POA: Diagnosis not present

## 2022-06-02 DIAGNOSIS — I69351 Hemiplegia and hemiparesis following cerebral infarction affecting right dominant side: Secondary | ICD-10-CM | POA: Diagnosis not present

## 2022-06-02 DIAGNOSIS — E785 Hyperlipidemia, unspecified: Secondary | ICD-10-CM | POA: Diagnosis not present

## 2022-06-02 DIAGNOSIS — M47812 Spondylosis without myelopathy or radiculopathy, cervical region: Secondary | ICD-10-CM | POA: Diagnosis not present

## 2022-06-02 DIAGNOSIS — E1142 Type 2 diabetes mellitus with diabetic polyneuropathy: Secondary | ICD-10-CM | POA: Diagnosis not present

## 2022-06-02 DIAGNOSIS — N1832 Chronic kidney disease, stage 3b: Secondary | ICD-10-CM | POA: Diagnosis not present

## 2022-06-02 DIAGNOSIS — E1151 Type 2 diabetes mellitus with diabetic peripheral angiopathy without gangrene: Secondary | ICD-10-CM | POA: Diagnosis not present

## 2022-06-02 DIAGNOSIS — S065X0D Traumatic subdural hemorrhage without loss of consciousness, subsequent encounter: Secondary | ICD-10-CM | POA: Diagnosis not present

## 2022-06-02 DIAGNOSIS — I495 Sick sinus syndrome: Secondary | ICD-10-CM | POA: Diagnosis not present

## 2022-06-02 DIAGNOSIS — R634 Abnormal weight loss: Secondary | ICD-10-CM | POA: Diagnosis not present

## 2022-06-02 DIAGNOSIS — I130 Hypertensive heart and chronic kidney disease with heart failure and stage 1 through stage 4 chronic kidney disease, or unspecified chronic kidney disease: Secondary | ICD-10-CM | POA: Diagnosis not present

## 2022-06-02 DIAGNOSIS — M160 Bilateral primary osteoarthritis of hip: Secondary | ICD-10-CM | POA: Diagnosis not present

## 2022-06-02 DIAGNOSIS — I5032 Chronic diastolic (congestive) heart failure: Secondary | ICD-10-CM | POA: Diagnosis not present

## 2022-06-02 DIAGNOSIS — I4729 Other ventricular tachycardia: Secondary | ICD-10-CM | POA: Diagnosis not present

## 2022-06-02 DIAGNOSIS — I872 Venous insufficiency (chronic) (peripheral): Secondary | ICD-10-CM | POA: Diagnosis not present

## 2022-06-02 DIAGNOSIS — I4821 Permanent atrial fibrillation: Secondary | ICD-10-CM | POA: Diagnosis not present

## 2022-06-02 DIAGNOSIS — Z7901 Long term (current) use of anticoagulants: Secondary | ICD-10-CM | POA: Diagnosis not present

## 2022-06-02 DIAGNOSIS — G8929 Other chronic pain: Secondary | ICD-10-CM | POA: Diagnosis not present

## 2022-06-02 DIAGNOSIS — K5730 Diverticulosis of large intestine without perforation or abscess without bleeding: Secondary | ICD-10-CM | POA: Diagnosis not present

## 2022-06-03 ENCOUNTER — Ambulatory Visit: Payer: Medicare Other

## 2022-06-03 ENCOUNTER — Ambulatory Visit: Payer: Medicare Other | Attending: Cardiology | Admitting: Cardiology

## 2022-06-03 ENCOUNTER — Encounter: Payer: Self-pay | Admitting: Cardiology

## 2022-06-03 VITALS — BP 139/97 | HR 81 | Ht 73.0 in | Wt 234.8 lb

## 2022-06-03 DIAGNOSIS — I872 Venous insufficiency (chronic) (peripheral): Secondary | ICD-10-CM | POA: Diagnosis not present

## 2022-06-03 DIAGNOSIS — E785 Hyperlipidemia, unspecified: Secondary | ICD-10-CM | POA: Diagnosis not present

## 2022-06-03 DIAGNOSIS — J449 Chronic obstructive pulmonary disease, unspecified: Secondary | ICD-10-CM | POA: Diagnosis not present

## 2022-06-03 DIAGNOSIS — I4729 Other ventricular tachycardia: Secondary | ICD-10-CM

## 2022-06-03 DIAGNOSIS — S065XAA Traumatic subdural hemorrhage with loss of consciousness status unknown, initial encounter: Secondary | ICD-10-CM | POA: Diagnosis not present

## 2022-06-03 DIAGNOSIS — Z7901 Long term (current) use of anticoagulants: Secondary | ICD-10-CM | POA: Diagnosis not present

## 2022-06-03 DIAGNOSIS — I5032 Chronic diastolic (congestive) heart failure: Secondary | ICD-10-CM

## 2022-06-03 DIAGNOSIS — I4811 Longstanding persistent atrial fibrillation: Secondary | ICD-10-CM

## 2022-06-03 DIAGNOSIS — I69322 Dysarthria following cerebral infarction: Secondary | ICD-10-CM | POA: Diagnosis not present

## 2022-06-03 DIAGNOSIS — D6869 Other thrombophilia: Secondary | ICD-10-CM | POA: Diagnosis not present

## 2022-06-03 DIAGNOSIS — I110 Hypertensive heart disease with heart failure: Secondary | ICD-10-CM | POA: Diagnosis not present

## 2022-06-03 DIAGNOSIS — C9000 Multiple myeloma not having achieved remission: Secondary | ICD-10-CM | POA: Diagnosis not present

## 2022-06-03 DIAGNOSIS — E1151 Type 2 diabetes mellitus with diabetic peripheral angiopathy without gangrene: Secondary | ICD-10-CM | POA: Diagnosis not present

## 2022-06-03 DIAGNOSIS — M160 Bilateral primary osteoarthritis of hip: Secondary | ICD-10-CM | POA: Diagnosis not present

## 2022-06-03 DIAGNOSIS — N1832 Chronic kidney disease, stage 3b: Secondary | ICD-10-CM | POA: Diagnosis not present

## 2022-06-03 DIAGNOSIS — I69351 Hemiplegia and hemiparesis following cerebral infarction affecting right dominant side: Secondary | ICD-10-CM | POA: Diagnosis not present

## 2022-06-03 DIAGNOSIS — I4821 Permanent atrial fibrillation: Secondary | ICD-10-CM

## 2022-06-03 DIAGNOSIS — K5730 Diverticulosis of large intestine without perforation or abscess without bleeding: Secondary | ICD-10-CM | POA: Diagnosis not present

## 2022-06-03 DIAGNOSIS — G8929 Other chronic pain: Secondary | ICD-10-CM | POA: Diagnosis not present

## 2022-06-03 DIAGNOSIS — S065X0D Traumatic subdural hemorrhage without loss of consciousness, subsequent encounter: Secondary | ICD-10-CM | POA: Diagnosis not present

## 2022-06-03 DIAGNOSIS — I130 Hypertensive heart and chronic kidney disease with heart failure and stage 1 through stage 4 chronic kidney disease, or unspecified chronic kidney disease: Secondary | ICD-10-CM | POA: Diagnosis not present

## 2022-06-03 DIAGNOSIS — M47812 Spondylosis without myelopathy or radiculopathy, cervical region: Secondary | ICD-10-CM | POA: Diagnosis not present

## 2022-06-03 DIAGNOSIS — I495 Sick sinus syndrome: Secondary | ICD-10-CM | POA: Diagnosis not present

## 2022-06-03 DIAGNOSIS — R634 Abnormal weight loss: Secondary | ICD-10-CM | POA: Diagnosis not present

## 2022-06-03 DIAGNOSIS — E1142 Type 2 diabetes mellitus with diabetic polyneuropathy: Secondary | ICD-10-CM | POA: Diagnosis not present

## 2022-06-03 DIAGNOSIS — E1122 Type 2 diabetes mellitus with diabetic chronic kidney disease: Secondary | ICD-10-CM | POA: Diagnosis not present

## 2022-06-03 MED ORDER — POTASSIUM CHLORIDE ER 10 MEQ PO TBCR: 10.0000 meq | EXTENDED_RELEASE_TABLET | Freq: Every day | ORAL | 3 refills | Status: DC

## 2022-06-03 MED ORDER — CARVEDILOL 3.125 MG PO TABS: 3.1250 mg | ORAL_TABLET | Freq: Two times a day (BID) | ORAL | 3 refills | Status: DC

## 2022-06-03 NOTE — Patient Instructions (Signed)
Medication Instructions:   Stop taking Diltiazem    Restart taking Carvedilol 3.125 mg twice a day    Discuss with Neuro doctor on when can you restart Eliqius    *If you need a refill on your cardiac medications before your next appointment, please call your pharmacy*   Lab Work: Not needed      Testing/Procedures:  Not needed  Follow-Up: At [facility], you and your health needs are our priority.  As part of our continuing mission to provide you with exceptional heart care, we have created designated Provider Care Teams.  These Care Teams include your primary Cardiologist (physician) and Advanced Practice Providers (APPs -  Physician Assistants and Nurse Practitioners) who all work together to provide you with the care you need, when you need it.     Your next appointment:   6 week(s)  The format for your next appointment:   In Person  Provider:   Almyra Deforest, PA-C Then, Glenetta Hew, MD will plan to see you again in 4 month(s).   Other Instructions  Let us know his blood pressure and heart rate when calling about  echo  results

## 2022-06-03 NOTE — Progress Notes (Signed)
Primary Care Provider: Biagio Borg, MD [place] Cardiologist: Glenetta Hew, MD Electrophysiologist: None  Clinic Note: Chief Complaint  Patient presents with   Follow-up    4-6 weeks.  Supposed to be follow-up echo that unfortunately has not yet been done. Multiple hospitalizations to review.  Patient accompanied by his daughter   ===================================  ASSESSMENT/PLAN   Problem List Items Addressed This Visit       Cardiology Problems   Hypertensive heart disease with chronic diastolic congestive heart failure (HCC) (Chronic)    Chronic hypertensive heart disease but not really having true heart failure symptoms.  The edema is chronic.  Well-controlled.  Apparently he has had his carvedilol discontinued -> I do think we need to restart carvedilol and even potential titrate the dose further.  This will also stop diltiazem. With his renal insufficiency he is not on ARB although this is also a possibility.  He had been on amlodipine and now stopped in lieu of diltiazem.  He is taking furosemide on a daily basis.      Relevant Medications   carvedilol (COREG) 3.125 MG tablet   Permanent atrial fibrillation (HCC) - now off amiodarone; asymptomatic. CHA2DS2-VASc Score -5 (on Xarelto) - Primary (Chronic)    He is in A-fib today although there was some reports in the hospital that he was not in A-fib.  If that the case this is more about persistent A-fib, but no longer trying to use antiarrhythmic agents as we have stopped his amiodarone in the past.  Not exactly sure why diltiazem was discontinued.  Currently off of anticoagulation.  He is on a combination of carvedilol and diltiazem.  Diltiazem apparently started for VT.  Plan: Discontinue diltiazem as he is having fatigue and worsening edema.  Restart carvedilol which we may be able to titrate up further for more blood pressure control as well.  We will need to find out from neurosurgery when they  think would be safe for him to potentially restart DOAC.  It may be that we simply have a good aspirin because of his frequent falls.  This conversation will need to have him follow-up  Tough philosophical question because he is also had a TIA this year as well as a subdural hematoma in succession.      Relevant Medications   carvedilol (COREG) 3.125 MG tablet   Venous insufficiency (chronic) (peripheral) with chronic lower extremity edema (Chronic)    Talk about the importance of leg elevation several times of the day.  Also needs to do pedal exercises.  Would also like to have him wear support stockings.  Very difficult to put them on.  Perhaps Unaboot wraps would be helpful.      Relevant Medications   carvedilol (COREG) 3.125 MG tablet   Hypercoagulable state due to longstanding persistent atrial fibrillation (HCC) (Chronic)    This patients CHA2DS2-VASc Score and unadjusted Ischemic Stroke Rate (% per year) is equal to 9.7 % stroke rate/year from a score of 6  Above score calculated as: 1 point each if present [CHF, HTN, DM, Vascular=MI/PAD/Aortic Plaque, Age if 11-74, or Male]; 2 points each if present [Age > 75, or Stroke/TIA/TE]  Clearly has an indication for anticoagulation.  However now with recent fall and subdural hematoma DOAC's been discontinued.  At this point I think we need to get clarification from neurosurgery and neurology as to the safety and timing of when to restart DOAC.  The other question would be is a too  high risk because of falls to restart DOAC.  This will be discussed in follow-up.  He is to check with neurology and neurosurgery.      Relevant Medications   carvedilol (COREG) 3.125 MG tablet   NSVT (nonsustained ventricular tachycardia) (HCC) (Chronic)    He has had a history of this.  Was put on diltiazem while in the hospital because they stopped his metoprolol.  My inclination would probably be to go back to metoprolol or simply restart and potentially  increase carvedilol dose.      Relevant Medications   carvedilol (COREG) 3.125 MG tablet     Other   Chronic kidney disease, stage 3b (HCC) (Chronic)    Creatinine level stable.  I do think we may actually better benefit him from putting him back on an ARB.  We will continue to monitor.      SDH (subdural hematoma) (HCC) (Chronic)    Fortunately, 2 months after having a TIA/stroke, he had a fall with subdural hematoma while on DOAC.  He has a very high CHA2DS2-VASc score and indication for using DOAC for stroke prophylaxis, however with recent subdural hematoma, his DOAC is currently on hold.  I would defer to neurosurgery when I think would potentially be safe.  The other question is could be if he is having frequent falls with to be actually restart DOAC.  This would mean understanding risk of stroke.      Stop taking Diltiazem    Restart taking Carvedilol 3.125 mg twice a day    Discuss with Neuro doctor on when can you restart Eliqius   ===================================  HPI:    [name] is a 76 y.o. male with a PMH below who presents today for 38-monthfollow-up.  Persistent A-fib ->  Difficult Control/Labile Hypertension Chronic HFpEF-Hypertensive Heart Disease Chronic Lower Extremity Edema/Venous Hypertension COPD HLD DM-2 with PN CKD 3A-B History of left occipital CVA 2021 followed by TIA July 2023 Subdural Hematoma History of NSVT  I have not seen [name] since February 2022.  He was noticing worsening edema-not going down his muscle was.  He I increase his furosemide dose along with amlodipine for more blood pressure control.  Always noting fatigue and exertional dyspnea but was 20 pounds up from previous.  No chest pain or pressure just exertional dyspnea fatigue and edema.  Significant limited by arthritis pains. => Plan was for him to increase his diuretic intake for several days and then 3 days a week.  He supposed to follow-up in 6 weeks but never  did follow-up.  Recent Hospitalizations:  July 7-13, 2023: Noted feeling poorly on July 6.  Noting having had a fall and urinary incontinence.  Last normal 10 PM July 6.  Found in the morning July 7 with right-sided weakness and dysarthria. => [facility]  In the ER [place] by Dr. SLeonie Mancode stroke team.  Symptoms end-systole cleared.  Likely out of the window for thrombolytics.  Mildly febrile at 101.2.  (UTI-treated with Rocephin)-=> Work-up with echo MRI/MRA brain head and neck.  Unable to perform MRI/MRA because of significant neck stiffness and inability to straighten the neck despite muscle relaxants.  Repeat head CT negative.  Cleared for discharge continue DOAC. Discharge to SNF.  On DOAC. Completed 5 days of Rocephin. Also tested positive for COVID started on Monday Provera. Metoprolol switched to Coreg for better BP control.  Amlodipine transitioned to diltiazem for ? V. Tach. April 04, 2022: Noted  to be mechanical fall on 03/21/2022 walking down the hallway without a walker to empty his urinary cancer and fell to the ground, struck the back of his head and had a lump there.  Started noticing intermittent headaches but no LOC.  No vomiting.  He was resistant to go to the hospital, but PCP saw him on the 13th that showed subdural hematoma. => Not admitted to the hospital.  Eliquis was discontinued Seen by Sherley Bounds, MD from neurosurgery on 04/08/2022.  Small right convexity SDH.  Felt to be subacute with tiny acute component.  Follow-up CT demonstrated progressive enlargement..  Noted constant mild to moderate headaches.  No vision numbness.  History of gait disturbance, using wheelchair. ->  Forwarded to Dr. Kathyrn Sheriff seen on 04/10/2022 => plan for medical meningeal artery embolization. He actually went to the emergency room on 04/10/2022 for significant headache.  MRI of the head/brain showed presence of subdural hematoma but no evidence of stroke or other process.   Headache treated with dexamethasone along with prochlorperazine Planned middle meningeal arterial embolization 04/17/2022: Aborted due to anatomy; additional comment that the patient was noted to have likely decreased EF with increased MTT and calcific plaque at the distal right common carotid/origin of the external carotid. Follow-up with Dr. Kathyrn Sheriff 05/08/2022: CT showed significant reduction in size of SDH.  Headaches improved.  Overall doing better. 04/20/20[facility]: Patient suffered a mechanical fall.  Bilateral forearm paresthesias and pain since fall.  Strength 5 out of 5.  Discharged home,  Sebastain Violet was last seen on 04/23/2022 by Almyra Deforest, PA.  Denies any chest pain or dyspnea.  Constant arm pain.  Radiating from the neck.  There was a question about reduced EF during cerebral angiogram.  Echo Ordered. = but not yet done.  Still off of anticoagulation because of SDH.  Felt to be too high risk to restart.   Reviewed  CV studies:    The following studies were reviewed today: (if available, images/films reviewed: From Epic Chart or Care Everywhere) Echo July 2023: EF 70-75%:Marland Kitchen  Hyperdynamic LV with no RWMA.  Mild LVH.  Severely dilated left atrium suggesting notable diastolic dysfunction, but unable to interpret.  Mildly enlarged RV with normal function.  Normal PAP despite moderate dilated RA and increased RAP/CVP.Marland Kitchen  No MS or MR.  No AAS or AI.  Aortic root measuring 42 mm with ascending aorta measuring 41 mm. CT head 01/24/2022-no acute findings.  Interval History:   Taray Hagberg returns to see me for the first time in in almost 2 years.  Not much happened until July of this year but since then has had multiple issues.  Spent quite a bit of time reviewing the chart with ER visits, H&P's, discharge summaries and clinic visits as well as procedures, etc.   He looks more disabled and debilitated than even the last time I saw him.  He is now pretty much in the  wheelchair and whenever he does get up to try to walk around he is extremely unsteady and has to use a walker.  He is always had his head leaning forward with his stiff neck. Thankfully his headache is improving.  His swelling is pretty much where it has been off and on.  He is taking some extra Lasix every other day.Marland Kitchen  Despite having edema, he still is not having signs or symptoms of heart failure.  He is in A-fib but does not notice it.  No change in his dyspnea.  He has always had some orthopnea sleeping in the recliner, but no change and no real PND symptoms.  No chest pain or pressure.  He notes that he is just got decreased energy level which probably has to do with he has not really do much at all since his TIA.  He is still doing PT and OT.  Still off of Eliquis, with no real plan is Roxy Manns is a restart.  There is several concerns 1 that he has had is SDH and has had frequent falls.  The other concern is that he just recently had a TIA or minor CVA.Marland Kitchen  CV Review of Symptoms (Summary): positive for - dyspnea on exertion, edema, orthopnea, and very limited exercise tolerance mostly because of poor unsteady gait, poor balance and extreme deconditioning. negative for - chest pain, irregular heartbeat, palpitations, paroxysmal nocturnal dyspnea, rapid heart rate, shortness of breath, or syncope or near syncope, TIA or amaurosis fugax, not walking enough to note claudication  He as usual he is accompanied by his daughter  REVIEWED OF SYSTEMS   Review of Systems  Constitutional:  Positive for malaise/fatigue (More so than usual) and weight loss (Weight is down about 23 pounds from September).  HENT:  Negative for congestion.   Respiratory:  Positive for shortness of breath (Per HPI). Negative for cough.   Cardiovascular:  Positive for leg swelling (Stable).  Gastrointestinal:  Negative for blood in stool and melena.  Genitourinary:  Negative for dysuria (Not currently).       Has had issues with  dysuria actually leading up to his TIA.  Musculoskeletal:  Positive for back pain, joint pain and neck pain.       With pain radiating from the neck down both arms.  Neurological:  Positive for dizziness. Negative for loss of consciousness and headaches (Improving).  Psychiatric/Behavioral:  Positive for memory loss (Poor historian). Negative for depression (Hard to tell his.  He is always seems to be somewhat down). The patient is not nervous/anxious.    I have reviewed and (if needed) personally updated the patient's problem list, medications, allergies, past medical and surgical history, social and family history.   PAST MEDICAL HISTORY   Past Medical History:  Diagnosis Date   Adenoma 05/2008   Alcoholism in recovery [facility])    BPH (benign prostatic hyperplasia)    Cancer (HCC)    Prostate   Chronic LBP    Hip & Back -- Sees Dr. Maia Petties @ [place]   CKD (chronic kidney disease)    with AKI requiring brief CRRT 02/2020   Colon polyps    Complex renal cyst 12/2010   Controlled type 2 diabetes mellitus with neuropathy ([place]) 01/2007   COPD (chronic obstructive pulmonary disease) ([place])    Diabetes ([place])    Diverticulitis of colon    ED (erectile dysfunction)    s/p Penile prosthesis (09/2010)   History of prostate cancer    Dr. Karsten Ro   History of sick sinus syndrome    reduced BB dose for Bradycardia   HLD (hyperlipidemia)    Hypertension    Impaired glucose tolerance 12/21/2013   Nephrolithiasis    Osteoarthritis of both hips    Osteoarthritis of lumbosacral spine    with Disc Disease   PAF (paroxysmal atrial fibrillation) (HCC)    No longer on Amiodarone.  Not on Anticoaguation b/c no recurrence.   Paresthesias/numbness    Bilateral LE   Peripheral neuropathy 12/21/2013   Stroke ([place])  left occipital CVA ~ 2018; possible TIA 01/24/22   Subdural hematoma ([place]) 04/04/2022   Anticoagulation stopped   Venous insufficiency     PAST SURGICAL HISTORY    Past Surgical History:  Procedure Laterality Date   IR ANGIO INTRA EXTRACRAN SEL COM CAROTID INNOMINATE UNI R MOD SED  04/17/2022   LEFT HEART CATH AND CORONARY ANGIOGRAPHY  2003   Normal Coronary Arteries.   NM MYOVIEW LTD  02/21/2020   EF 60%.  Medium sized mild severity defect in the basal inferior, mid inferior and apical inferior location-suggesting of ischemia.  Read as low-intermediate risk.  (On cardiology review this is felt to be a fixed defect either related to prior infarct versus diaphragmatic attenuation.  Felt to be low risk)   PENILE PROSTHESIS IMPLANT  06/21/2012   Procedure: PENILE PROTHESIS INFLATABLE;  Surgeon: Claybon Jabs, MD;  Location: [facility];  Service: Urology;  Laterality: N/A;  REMOVAL AND REPLACEMENT OF SOME  OF PROSTHESIS (AMS)    PENILE PROSTHESIS PLACEMENT  09/2010   PROSTATECTOMY     RADIOLOGY WITH ANESTHESIA N/A 04/17/2022   Procedure: Right middle meningeal artery embolization;  Surgeon: Consuella Lose, MD;  Location: [facility];  Service: Radiology;  Laterality: N/A;   REMOVAL OF PENILE PROSTHESIS N/A 02/15/2018   Procedure: REMOVAL OF PENILE PROSTHESIS;  Surgeon: Kathie Rhodes, MD;  Location: [facility];  Service: Urology;  Laterality: N/A;   TRANSTHORACIC ECHOCARDIOGRAM  01/2022   EF 70-75%:Marland Kitchen  Hyperdynamic LV with no RWMA.  Mild LVH.  Severely dilated left atrium suggesting notable diastolic dysfunction, but unable to interpret.  Mildly enlarged RV with normal function.  Normal PAP despite moderate dilated RA and increased RAP/CVP.Marland Kitchen  No MS or MR.  No AAS or AI.  Aortic root measuring 42 mm with ascending aorta measuring 41 mm.   TRANSTHORACIC ECHOCARDIOGRAM  02/17/2020   EF 60 to 65%.  No R WMA.  Unable to assess diastolic parameters because of A. fib.  Mildly elevated PA pressures.  Mild LA dilation.  Mild aortic valve sclerosis but no stenosis.  Normal IVC.    Immunization History  Administered Date(s) Administered   Fluad Quad(high  Dose 65+) 04/02/2022   H1N1 08/08/2008   Influenza Split 06/03/2011, 04/15/2012   Influenza Whole 04/20/2008, 04/20/2009   Influenza, High Dose Seasonal PF 06/01/2013, 04/27/2018, 05/11/2019   Influenza,inj,Quad PF,6+ Mos 04/28/2014   Influenza-Unspecified 05/31/2016, 06/20/2017   Moderna Sars-Covid-2 Vaccination 10/20/2019, 11/17/2019, 06/28/2020, 02/13/2021   Pneumococcal Conjugate-13 12/21/2013   Pneumococcal Polysaccharide-23 01/19/2008, 07/29/2017   Td 08/08/2008   Zoster Recombinat (Shingrix) 02/13/2021   Zoster, Live 05/31/2014    MEDICATIONS/ALLERGIES   Current Meds  Medication Sig   acetaminophen (TYLENOL) 500 MG tablet Take 1,000 mg by mouth every 8 (eight) hours as needed for headache.   acetaminophen (TYLENOL) 650 MG CR tablet Take 1,300 mg by mouth every 8 (eight) hours as needed for pain.   Blood Glucose Monitoring Suppl (ACCU-CHEK GUIDE ME) w/Device KIT Use as directed twice per day E11.9   carvedilol (COREG) 3.125 MG tablet Take 3.125 mg by mouth 2 (two) times daily with a meal.   diltiazem (CARDIZEM LA) 120 MG 24 hr tablet Take 1 tablet (120 mg total) by mouth daily.   furosemide (LASIX) 40 MG tablet TAKE 1 TABLET BY MOUTH DAILY .  MAY TAKE AN ADDITIONAL 1 TABLET  BY MOUTH IF NEEDED FOR SWELLING   glucose blood (ACCU-CHEK GUIDE) test strip Use as instructed twice per day  E11.9   Lancet Devices (ONETOUCH DELICA PLUS LANCING) MISC    Lancets (ONETOUCH DELICA PLUS [id number]) MISC USE ONCE DAILY   lidocaine (LIDODERM) 5 % Place 1 patch onto the skin daily. Remove & Discard patch within 12 hours or as directed by MD   ondansetron (ZOFRAN-ODT) 4 MG disintegrating tablet Take 1 tablet (4 mg total) by mouth every 8 (eight) hours as needed for nausea or vomiting.   potassium chloride (KLOR-CON) 10 MEQ tablet Take 1 tablet (10 mEq total) by mouth daily.   PROAIR HFA 108 (90 Base) MCG/ACT inhaler INHALE 2 PUFFS INTO THE  LUNGS EVERY 6 HOURS AS  NEEDED (Patient taking differently:  Inhale 2 puffs into the lungs every 6 (six) hours as needed for shortness of breath or wheezing.)   prochlorperazine (COMPAZINE) 10 MG tablet Take 1 tablet (10 mg total) by mouth every 6 (six) hours as needed for nausea or vomiting (or headache).   rosuvastatin (CRESTOR) 20 MG tablet Take 20 mg by mouth every evening.    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   Divorced. 3 grown children. 4 GC.     Quit smoking ~20+ yrs ago.     OBJCTIVE -PE, EKG, labs   Wt Readings from Last 3 Encounters:  06/03/22 234 lb 12.8 oz (106.5 kg)  04/17/22 257 lb (116.6 kg)  04/02/22 257 lb (116.6 kg)    Physical Exam: BP (!) 139/97 (BP Location: Left Arm, Patient Position: Sitting, Cuff Size: Large)   Pulse 81   Ht 6' 1" (1.854 m)   Wt 234 lb 12.8 oz (106.5 kg)   SpO2 95%   BMI 30.98 kg/m  Physical Exam Vitals reviewed.  Constitutional:      General: He is not in acute distress.    Appearance: He is obese. He is ill-appearing (Chronic ill, debilitated appearance.  Is in a wheelchair.  Unable to lift his legs up.  Have difficulty getting comfortable.  Significant cervical kyphosis with rigidity.). He is not toxic-appearing.     Comments: Definitely appears older and more worn out now.  Still somewhat cantankerous  HENT:     Head: Normocephalic and atraumatic.  Neck:     Vascular: No carotid bruit or JVD.  Cardiovascular:      Rate and Rhythm: Normal rate. Rhythm irregularly irregular.     Chest Wall: PMI is not displaced.     Pulses: Decreased pulses (Difficult to palpate pedal pulses because of edema.).     Heart sounds: S1 normal and S2 normal. Heart sounds are distant. Murmur heard.     Harsh crescendo-decrescendo early systolic murmur is present with a grade of 1/6 at the upper right sternal border radiating to the neck.     No friction rub.  Pulmonary:     Effort:  Pulmonary effort is normal. No respiratory distress.     Breath sounds: Normal breath sounds. No wheezing, rhonchi or rales.  Chest:     Chest wall: No tenderness.  Musculoskeletal:     Cervical back: Rigidity (Longstanding with kyphosis) present. Decreased range of motion.  Neurological:     Mental Status: He is alert and oriented to person, place, and time.     Motor: Weakness (Legs are profoundly weak has a hard time even picking them up) present.  Psychiatric:        Thought Content: Thought content normal.        Judgment: Judgment normal.     Comments: Somewhat grouchy mood.  Just seems uncomfortable showing signs of frustration.     Adult ECG Report  Rate: 81 ;  Rhythm: atrial fibrillation and LBBB with LVH.  Anterolateral, age-indeterminate. . ;   Narrative Interpretation: Stable  Recent Labs: Reviewed Lab Results  Component Value Date   CHOL 102 03/10/2022   HDL 29.10 (L) 03/10/2022   LDLCALC 57 03/10/2022   TRIG 77.0 03/10/2022   CHOLHDL 3 03/10/2022   Lab Results  Component Value Date   CREATININE 1.56 (H) 04/30/2022   BUN 22 04/30/2022   NA 136 04/30/2022   K 4.2 04/30/2022   CL 101 04/30/2022   CO2 26 04/30/2022      Latest Ref Rng & Units 04/30/2022    4:25 PM 04/17/2022    9:25 PM 04/10/2022   10:58 PM  CBC  WBC 4.0 - 10.5 K/uL 7.4  8.5    Hemoglobin 13.0 - 17.0 g/dL 13.9  13.8  15.3   Hematocrit 39.0 - 52.0 % 42.5  43.1  45.0   Platelets 150.0 - 400.0 K/uL 166.0  239      Lab Results  Component  Value Date   HGBA1C 7.0 (H) 03/10/2022   Lab Results  Component Value Date   TSH 1.363 01/24/2022    ================================================== I spent a total of 55 minutes with the patient spent in direct patient consultation.  Additional time spent with chart review  / charting (studies, outside notes, etc): 50 min Almost 2 years of history reviewed including several ER visits and hospital visits as well as procedures.  Clinic visit is also reviewed.  Multiple studies reviewed. Total Time: 105 min  Current medicines are reviewed at length with the patient today.  (+/- concerns) none  Notice: This dictation was prepared with Dragon dictation along with smart phrase technology. Any transcriptional errors that result from this process are unintentional and may not be corrected upon review.  Studies Ordered:   No orders of the defined types were placed in this encounter.  Meds ordered this encounter  Medications   potassium chloride (KLOR-CON) 10 MEQ tablet    Sig: Take 1 tablet (10 mEq total) by mouth daily.    Dispense:  100 tablet    Refill:  3    Please send a replace/new response with 100-Day Supply if appropriate to maximize member benefit. Requesting 1 year supply.   carvedilol (COREG) 3.125 MG tablet    Sig: Take 1 tablet (3.125 mg total) by mouth 2 (two) times daily with a meal.    Dispense:  180 tablet    Refill:  3    Patient Instructions / Medication Changes & Studies & Tests Ordered   Patient Instructions  Medication Instructions:   Stop taking Diltiazem    Restart taking Carvedilol 3.125 mg twice a day  Discuss with Neuro doctor on when can you restart Eliqius    *If you need a refill on your cardiac medications before your next appointment, please call your pharmacy*   Lab Work: Not needed      Testing/Procedures:  Not needed  Follow-Up: At [facility], you and your health needs are our priority.  As part of our continuing  mission to provide you with exceptional heart care, we have created designated Provider Care Teams.  These Care Teams include your primary Cardiologist (physician) and Advanced Practice Providers (APPs -  Physician Assistants and Nurse Practitioners) who all work together to provide you with the care you need, when you need it.     Your next appointment:   6 week(s)  The format for your next appointment:   In Person  Provider:   Almyra Deforest, PA-C Then, Glenetta Hew, MD will plan to see you again in 4 month(s).   Other Instructions  Let us know his blood pressure and heart rate when calling about  echo  results      Leonie Man, MD, MS Glenetta Hew, M.D., M.S. Interventional Cardiologist  [place]  Pager # [phone] Phone # [phone] [address] [place], [place] [address]   Thank you for choosing [facility] [place] [facility] [place]!!

## 2022-06-03 NOTE — Telephone Encounter (Signed)
Spoke with Tillie Rung and advised for UA and culture.

## 2022-06-04 ENCOUNTER — Telehealth: Payer: Self-pay | Admitting: Internal Medicine

## 2022-06-04 DIAGNOSIS — I872 Venous insufficiency (chronic) (peripheral): Secondary | ICD-10-CM | POA: Diagnosis not present

## 2022-06-04 DIAGNOSIS — M47812 Spondylosis without myelopathy or radiculopathy, cervical region: Secondary | ICD-10-CM | POA: Diagnosis not present

## 2022-06-04 DIAGNOSIS — K5730 Diverticulosis of large intestine without perforation or abscess without bleeding: Secondary | ICD-10-CM | POA: Diagnosis not present

## 2022-06-04 DIAGNOSIS — I69322 Dysarthria following cerebral infarction: Secondary | ICD-10-CM | POA: Diagnosis not present

## 2022-06-04 DIAGNOSIS — I130 Hypertensive heart and chronic kidney disease with heart failure and stage 1 through stage 4 chronic kidney disease, or unspecified chronic kidney disease: Secondary | ICD-10-CM | POA: Diagnosis not present

## 2022-06-04 DIAGNOSIS — E1151 Type 2 diabetes mellitus with diabetic peripheral angiopathy without gangrene: Secondary | ICD-10-CM | POA: Diagnosis not present

## 2022-06-04 DIAGNOSIS — G8929 Other chronic pain: Secondary | ICD-10-CM | POA: Diagnosis not present

## 2022-06-04 DIAGNOSIS — E1142 Type 2 diabetes mellitus with diabetic polyneuropathy: Secondary | ICD-10-CM | POA: Diagnosis not present

## 2022-06-04 DIAGNOSIS — I495 Sick sinus syndrome: Secondary | ICD-10-CM | POA: Diagnosis not present

## 2022-06-04 DIAGNOSIS — S065X0D Traumatic subdural hemorrhage without loss of consciousness, subsequent encounter: Secondary | ICD-10-CM | POA: Diagnosis not present

## 2022-06-04 DIAGNOSIS — I5032 Chronic diastolic (congestive) heart failure: Secondary | ICD-10-CM | POA: Diagnosis not present

## 2022-06-04 DIAGNOSIS — C9000 Multiple myeloma not having achieved remission: Secondary | ICD-10-CM | POA: Diagnosis not present

## 2022-06-04 DIAGNOSIS — N390 Urinary tract infection, site not specified: Secondary | ICD-10-CM | POA: Diagnosis not present

## 2022-06-04 DIAGNOSIS — E785 Hyperlipidemia, unspecified: Secondary | ICD-10-CM | POA: Diagnosis not present

## 2022-06-04 DIAGNOSIS — R634 Abnormal weight loss: Secondary | ICD-10-CM | POA: Diagnosis not present

## 2022-06-04 DIAGNOSIS — N1832 Chronic kidney disease, stage 3b: Secondary | ICD-10-CM | POA: Diagnosis not present

## 2022-06-04 DIAGNOSIS — E1122 Type 2 diabetes mellitus with diabetic chronic kidney disease: Secondary | ICD-10-CM | POA: Diagnosis not present

## 2022-06-04 DIAGNOSIS — I69351 Hemiplegia and hemiparesis following cerebral infarction affecting right dominant side: Secondary | ICD-10-CM | POA: Diagnosis not present

## 2022-06-04 DIAGNOSIS — M160 Bilateral primary osteoarthritis of hip: Secondary | ICD-10-CM | POA: Diagnosis not present

## 2022-06-04 DIAGNOSIS — Z7901 Long term (current) use of anticoagulants: Secondary | ICD-10-CM | POA: Diagnosis not present

## 2022-06-04 DIAGNOSIS — I4821 Permanent atrial fibrillation: Secondary | ICD-10-CM | POA: Diagnosis not present

## 2022-06-04 DIAGNOSIS — J449 Chronic obstructive pulmonary disease, unspecified: Secondary | ICD-10-CM | POA: Diagnosis not present

## 2022-06-04 DIAGNOSIS — I4729 Other ventricular tachycardia: Secondary | ICD-10-CM | POA: Diagnosis not present

## 2022-06-04 IMAGING — CR DG BONE SURVEY MET
9 of 10 series · 9 of 10 positions shown · non-contrast
Comparison: Head CT 02/15/2020.

CLINICAL DATA: Multiple myeloma.  Weakness.  Altered mental status.

EXAM:
METASTATIC BONE SURVEY

[skull lat]
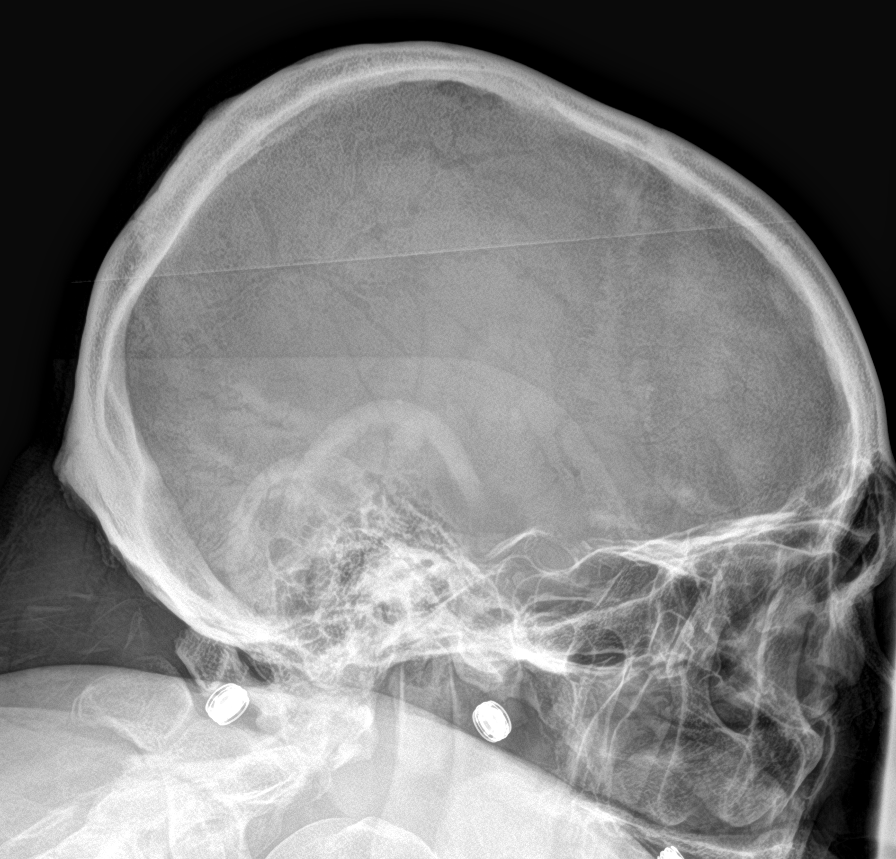

[shoulder ap (1 of 2)]
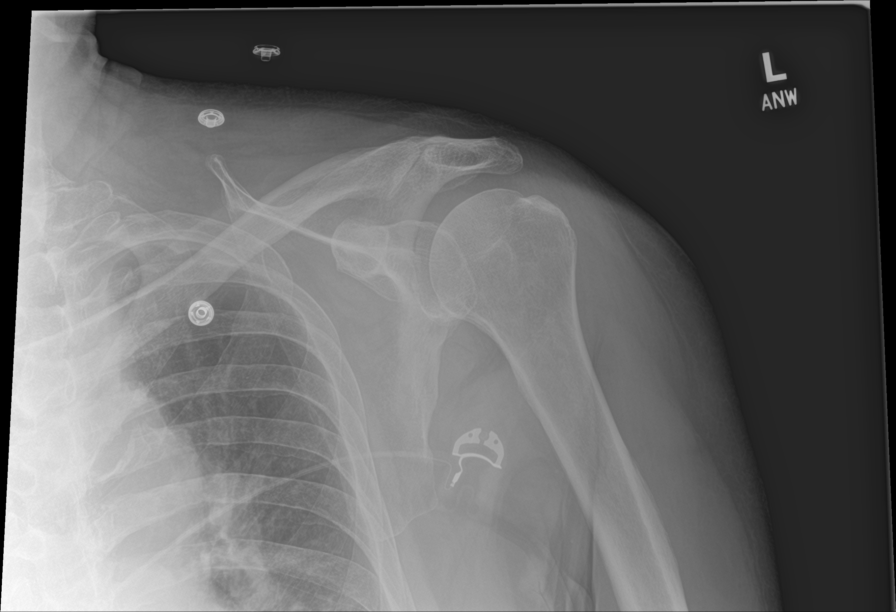

[shoulder ap (2 of 2)]
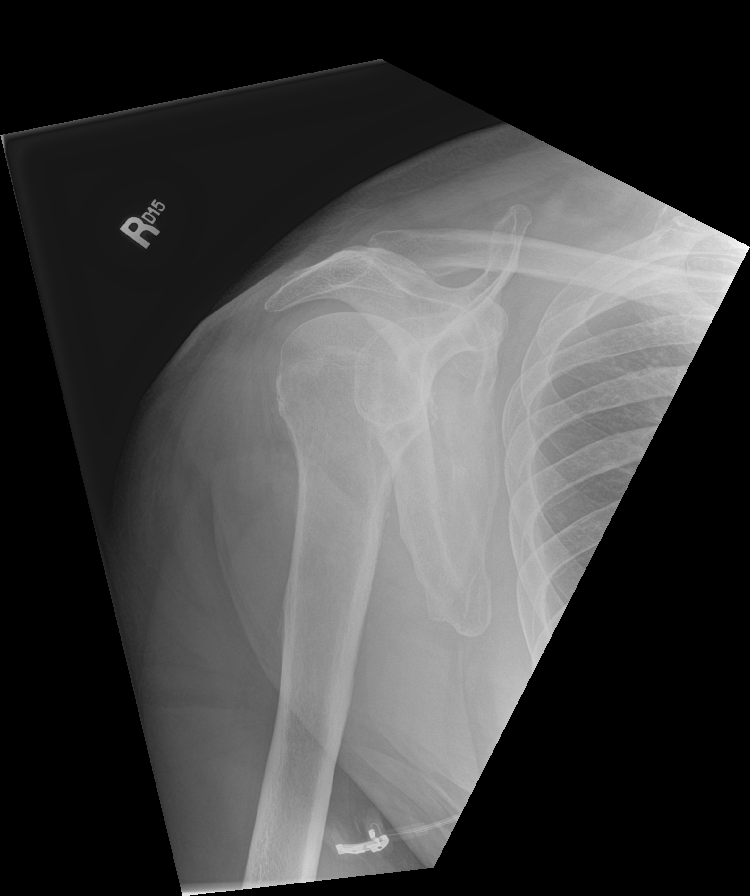

[humerus ap (1 of 2)]
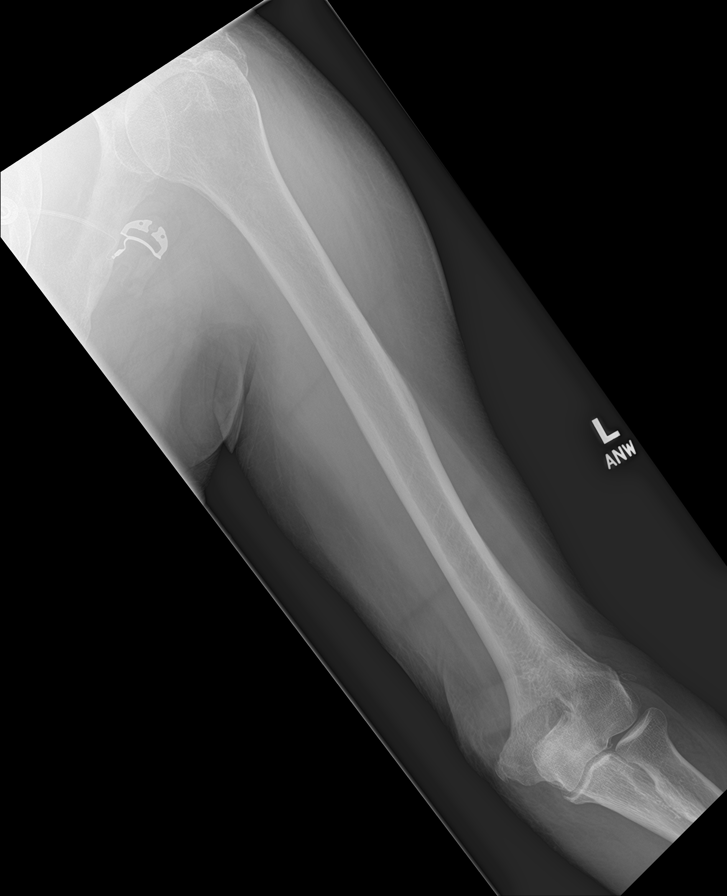

[humerus ap (2 of 2)]
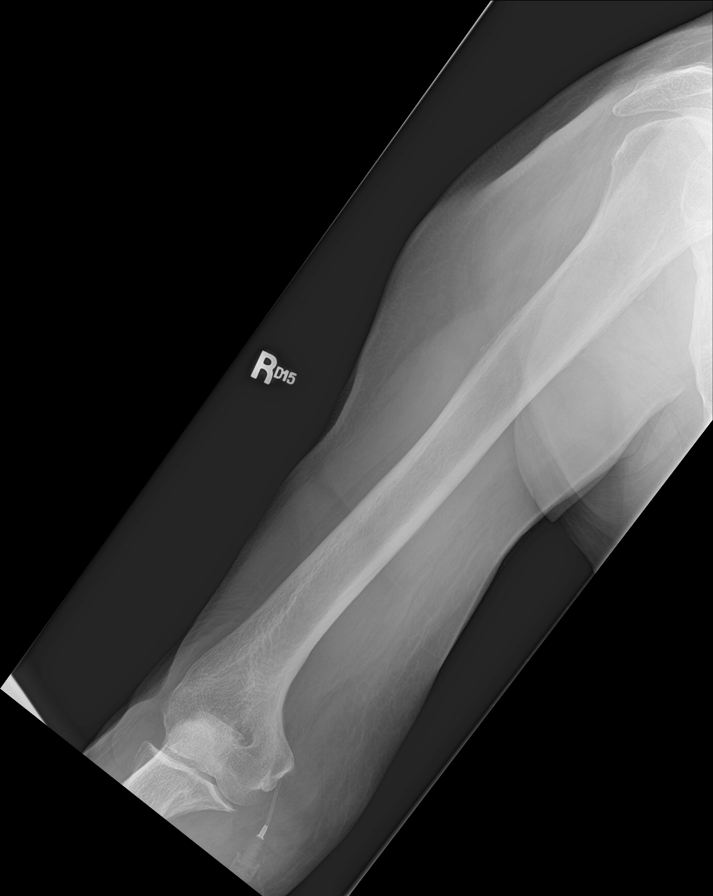

[forearm ap (1 of 2)]
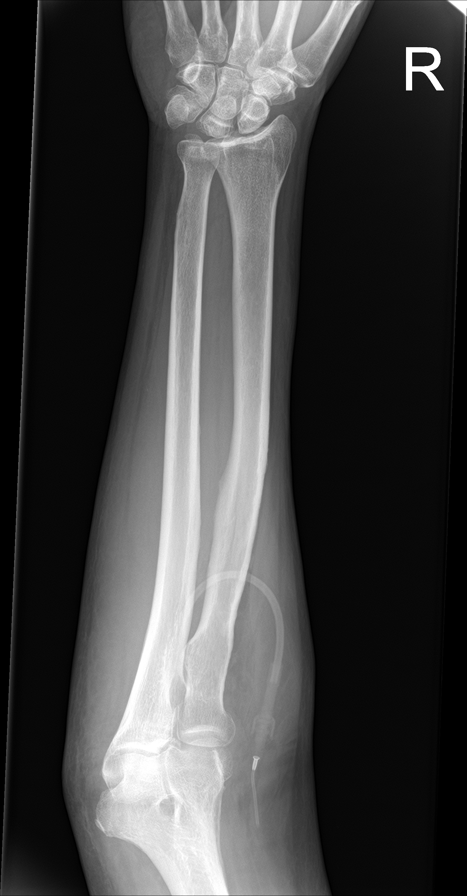

[forearm ap (2 of 2)]
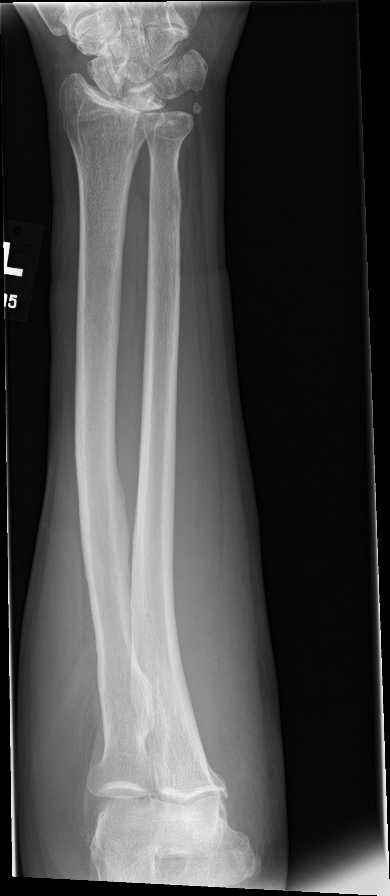

[c-spine lat]
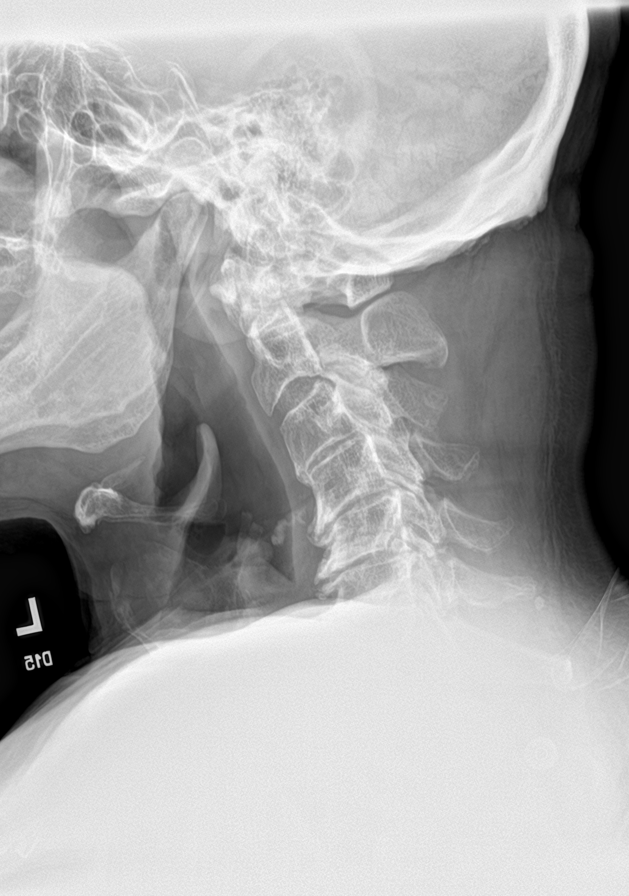

[t-spine ap]
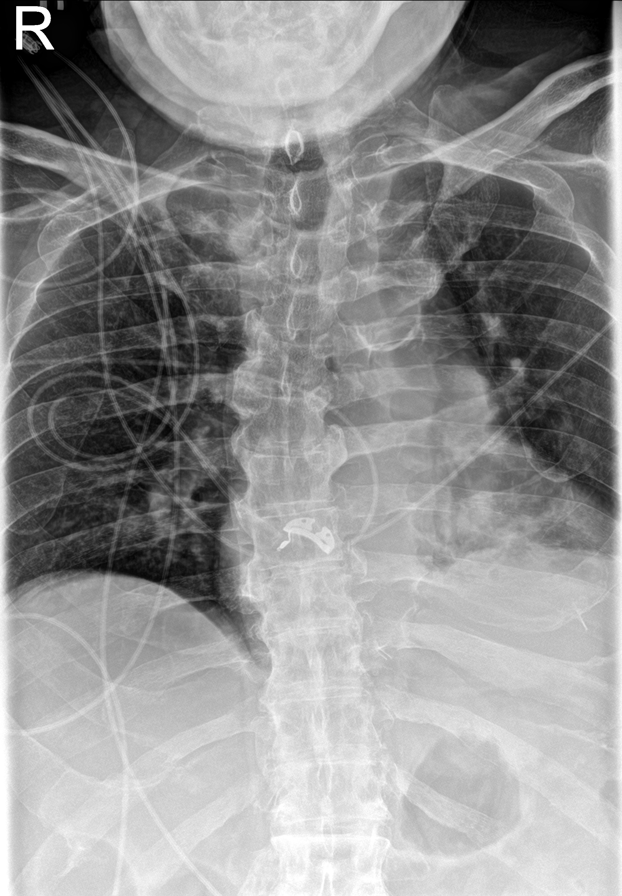

[9 of 10 positions shown; findings below may reference images not displayed]

FINDINGS: There are 2 small lucent lesions in the distal femoral shaft and 2
small lucent lesions in the mid tibial shaft. Left knee
arthroplasty. These lesions are typical of ghost tracks from prior
fixator pins. There is no other radiographic evidence of focal bone
lesion. Heterogeneous appearance in the lower lumbar spine that is
nonspecific and likely accentuated by multilevel degenerative
change. Diffuse cervical, thoracic, and lumbar degenerative change.
No evidence of compression fracture. Aortic atherosclerosis.
IMPRESSION: 1. Two small lucent lesions in the distal femoral shaft and 2 small
lucent lesions in the mid tibial shaft. Suspect this is related to
prior external fixator pin placement, recommend correlation with
clinical history. Left knee arthroplasty.
2. There is otherwise no radiographic evidence of focal bone lesion.

## 2022-06-04 NOTE — Telephone Encounter (Signed)
Melissa calls today in regards to getting verbal orders set for PT.   Occupational therapy plan of care Once a week for two weeks Twice a week for one week Once a week for one week with re-certification  Verbal orders can be left via protected VM at   [facility]: [place] also notes that PT is still not back to %100 after there head injury. PT is depressed/down, not progressing in how they transfer, having difficulty using their remote from day to day. Stated that even though the neuro surgeon had shut down an appointment with him, perhaps a visit with the neurologist could be good to check and make sure everything is alright.

## 2022-06-05 DIAGNOSIS — I69322 Dysarthria following cerebral infarction: Secondary | ICD-10-CM | POA: Diagnosis not present

## 2022-06-05 DIAGNOSIS — I4821 Permanent atrial fibrillation: Secondary | ICD-10-CM | POA: Diagnosis not present

## 2022-06-05 DIAGNOSIS — E1122 Type 2 diabetes mellitus with diabetic chronic kidney disease: Secondary | ICD-10-CM | POA: Diagnosis not present

## 2022-06-05 DIAGNOSIS — I130 Hypertensive heart and chronic kidney disease with heart failure and stage 1 through stage 4 chronic kidney disease, or unspecified chronic kidney disease: Secondary | ICD-10-CM | POA: Diagnosis not present

## 2022-06-05 DIAGNOSIS — S065X0D Traumatic subdural hemorrhage without loss of consciousness, subsequent encounter: Secondary | ICD-10-CM | POA: Diagnosis not present

## 2022-06-05 DIAGNOSIS — N1832 Chronic kidney disease, stage 3b: Secondary | ICD-10-CM | POA: Diagnosis not present

## 2022-06-05 DIAGNOSIS — J449 Chronic obstructive pulmonary disease, unspecified: Secondary | ICD-10-CM | POA: Diagnosis not present

## 2022-06-05 DIAGNOSIS — E1142 Type 2 diabetes mellitus with diabetic polyneuropathy: Secondary | ICD-10-CM | POA: Diagnosis not present

## 2022-06-05 DIAGNOSIS — K5730 Diverticulosis of large intestine without perforation or abscess without bleeding: Secondary | ICD-10-CM | POA: Diagnosis not present

## 2022-06-05 DIAGNOSIS — I4729 Other ventricular tachycardia: Secondary | ICD-10-CM | POA: Diagnosis not present

## 2022-06-05 DIAGNOSIS — I5032 Chronic diastolic (congestive) heart failure: Secondary | ICD-10-CM | POA: Diagnosis not present

## 2022-06-05 DIAGNOSIS — I872 Venous insufficiency (chronic) (peripheral): Secondary | ICD-10-CM | POA: Diagnosis not present

## 2022-06-05 DIAGNOSIS — G8929 Other chronic pain: Secondary | ICD-10-CM | POA: Diagnosis not present

## 2022-06-05 DIAGNOSIS — E785 Hyperlipidemia, unspecified: Secondary | ICD-10-CM | POA: Diagnosis not present

## 2022-06-05 DIAGNOSIS — C9000 Multiple myeloma not having achieved remission: Secondary | ICD-10-CM | POA: Diagnosis not present

## 2022-06-05 DIAGNOSIS — I69351 Hemiplegia and hemiparesis following cerebral infarction affecting right dominant side: Secondary | ICD-10-CM | POA: Diagnosis not present

## 2022-06-05 DIAGNOSIS — Z7901 Long term (current) use of anticoagulants: Secondary | ICD-10-CM | POA: Diagnosis not present

## 2022-06-05 DIAGNOSIS — E1151 Type 2 diabetes mellitus with diabetic peripheral angiopathy without gangrene: Secondary | ICD-10-CM | POA: Diagnosis not present

## 2022-06-05 DIAGNOSIS — M47812 Spondylosis without myelopathy or radiculopathy, cervical region: Secondary | ICD-10-CM | POA: Diagnosis not present

## 2022-06-05 DIAGNOSIS — R634 Abnormal weight loss: Secondary | ICD-10-CM | POA: Diagnosis not present

## 2022-06-05 DIAGNOSIS — M160 Bilateral primary osteoarthritis of hip: Secondary | ICD-10-CM | POA: Diagnosis not present

## 2022-06-05 DIAGNOSIS — I495 Sick sinus syndrome: Secondary | ICD-10-CM | POA: Diagnosis not present

## 2022-06-05 NOTE — Telephone Encounter (Signed)
Ok for verbals 

## 2022-06-05 NOTE — Telephone Encounter (Signed)
[name] given approval for verbal orders through confidential voicemail.

## 2022-06-06 ENCOUNTER — Other Ambulatory Visit (HOSPITAL_BASED_OUTPATIENT_CLINIC_OR_DEPARTMENT_OTHER): Payer: Medicare Other

## 2022-06-08 ENCOUNTER — Encounter: Payer: Self-pay | Admitting: Cardiology

## 2022-06-08 DIAGNOSIS — D6869 Other thrombophilia: Secondary | ICD-10-CM | POA: Insufficient documentation

## 2022-06-08 NOTE — Assessment & Plan Note (Addendum)
He has had a history of this.  Was put on diltiazem while in the hospital because they stopped his metoprolol.  My inclination would probably be to go back to metoprolol or simply restart and potentially increase carvedilol dose.

## 2022-06-08 NOTE — Assessment & Plan Note (Addendum)
He is in A-fib today although there was some reports in the hospital that he was not in A-fib.  If that the case this is more about persistent A-fib, but no longer trying to use antiarrhythmic agents as we have stopped his amiodarone in the past.  Not exactly sure why diltiazem was discontinued.  Currently off of anticoagulation.  He is on a combination of carvedilol and diltiazem.  Diltiazem apparently started for VT.  Plan: Discontinue diltiazem as he is having fatigue and worsening edema.  Restart carvedilol which we may be able to titrate up further for more blood pressure control as well.  We will need to find out from neurosurgery when they think would be safe for him to potentially restart DOAC.  It may be that we simply have a good aspirin because of his frequent falls.  This conversation will need to have him follow-up  Tough philosophical question because he is also had a TIA this year as well as a subdural hematoma in succession.

## 2022-06-08 NOTE — Assessment & Plan Note (Signed)
This patients CHA2DS2-VASc Score and unadjusted Ischemic Stroke Rate (% per year) is equal to 9.7 % stroke rate/year from a score of 6  Above score calculated as: 1 point each if present [CHF, HTN, DM, Vascular=MI/PAD/Aortic Plaque, Age if 65-74, or Male]; 2 points each if present [Age > 75, or Stroke/TIA/TE]  Clearly has an indication for anticoagulation.  However now with recent fall and subdural hematoma DOAC's been discontinued.  At this point I think we need to get clarification from neurosurgery and neurology as to the safety and timing of when to restart DOAC.  The other question would be is a too high risk because of falls to restart DOAC.  This will be discussed in follow-up.  He is to check with neurology and neurosurgery.

## 2022-06-08 NOTE — Assessment & Plan Note (Signed)
Talk about the importance of leg elevation several times of the day.  Also needs to do pedal exercises.  Would also like to have him wear support stockings.  Very difficult to put them on.  Perhaps Unaboot wraps would be helpful.

## 2022-06-08 NOTE — Assessment & Plan Note (Signed)
Fortunately, 2 months after having a TIA/stroke, he had a fall with subdural hematoma while on DOAC.  He has a very high CHA2DS2-VASc score and indication for using DOAC for stroke prophylaxis, however with recent subdural hematoma, his DOAC is currently on hold.  I would defer to neurosurgery when I think would potentially be safe.  The other question is could be if he is having frequent falls with to be actually restart DOAC.  This would mean understanding risk of stroke.

## 2022-06-08 NOTE — Assessment & Plan Note (Signed)
Creatinine level stable.  I do think we may actually better benefit him from putting him back on an ARB.  We will continue to monitor.

## 2022-06-08 NOTE — Assessment & Plan Note (Addendum)
Chronic hypertensive heart disease but not really having true heart failure symptoms.  The edema is chronic.  Well-controlled.  Apparently he has had his carvedilol discontinued -> I do think we need to restart carvedilol and even potential titrate the dose further.  This will also stop diltiazem. With his renal insufficiency he is not on ARB although this is also a possibility.  He had been on amlodipine and now stopped in lieu of diltiazem.  He is taking furosemide on a daily basis.

## 2022-06-09 DIAGNOSIS — I495 Sick sinus syndrome: Secondary | ICD-10-CM | POA: Diagnosis not present

## 2022-06-09 DIAGNOSIS — I69351 Hemiplegia and hemiparesis following cerebral infarction affecting right dominant side: Secondary | ICD-10-CM | POA: Diagnosis not present

## 2022-06-09 DIAGNOSIS — J449 Chronic obstructive pulmonary disease, unspecified: Secondary | ICD-10-CM | POA: Diagnosis not present

## 2022-06-09 DIAGNOSIS — I872 Venous insufficiency (chronic) (peripheral): Secondary | ICD-10-CM | POA: Diagnosis not present

## 2022-06-09 DIAGNOSIS — C9000 Multiple myeloma not having achieved remission: Secondary | ICD-10-CM | POA: Diagnosis not present

## 2022-06-09 DIAGNOSIS — I5032 Chronic diastolic (congestive) heart failure: Secondary | ICD-10-CM | POA: Diagnosis not present

## 2022-06-09 DIAGNOSIS — M160 Bilateral primary osteoarthritis of hip: Secondary | ICD-10-CM | POA: Diagnosis not present

## 2022-06-09 DIAGNOSIS — E1151 Type 2 diabetes mellitus with diabetic peripheral angiopathy without gangrene: Secondary | ICD-10-CM | POA: Diagnosis not present

## 2022-06-09 DIAGNOSIS — Z7901 Long term (current) use of anticoagulants: Secondary | ICD-10-CM | POA: Diagnosis not present

## 2022-06-09 DIAGNOSIS — I69322 Dysarthria following cerebral infarction: Secondary | ICD-10-CM | POA: Diagnosis not present

## 2022-06-09 DIAGNOSIS — I4821 Permanent atrial fibrillation: Secondary | ICD-10-CM | POA: Diagnosis not present

## 2022-06-09 DIAGNOSIS — N1832 Chronic kidney disease, stage 3b: Secondary | ICD-10-CM | POA: Diagnosis not present

## 2022-06-09 DIAGNOSIS — I4729 Other ventricular tachycardia: Secondary | ICD-10-CM | POA: Diagnosis not present

## 2022-06-09 DIAGNOSIS — M47812 Spondylosis without myelopathy or radiculopathy, cervical region: Secondary | ICD-10-CM | POA: Diagnosis not present

## 2022-06-09 DIAGNOSIS — I130 Hypertensive heart and chronic kidney disease with heart failure and stage 1 through stage 4 chronic kidney disease, or unspecified chronic kidney disease: Secondary | ICD-10-CM | POA: Diagnosis not present

## 2022-06-09 DIAGNOSIS — E1142 Type 2 diabetes mellitus with diabetic polyneuropathy: Secondary | ICD-10-CM | POA: Diagnosis not present

## 2022-06-09 DIAGNOSIS — K5730 Diverticulosis of large intestine without perforation or abscess without bleeding: Secondary | ICD-10-CM | POA: Diagnosis not present

## 2022-06-09 DIAGNOSIS — S065X0D Traumatic subdural hemorrhage without loss of consciousness, subsequent encounter: Secondary | ICD-10-CM | POA: Diagnosis not present

## 2022-06-09 DIAGNOSIS — G8929 Other chronic pain: Secondary | ICD-10-CM | POA: Diagnosis not present

## 2022-06-09 DIAGNOSIS — E785 Hyperlipidemia, unspecified: Secondary | ICD-10-CM | POA: Diagnosis not present

## 2022-06-09 DIAGNOSIS — R634 Abnormal weight loss: Secondary | ICD-10-CM | POA: Diagnosis not present

## 2022-06-09 DIAGNOSIS — E1122 Type 2 diabetes mellitus with diabetic chronic kidney disease: Secondary | ICD-10-CM | POA: Diagnosis not present

## 2022-06-10 DIAGNOSIS — E1142 Type 2 diabetes mellitus with diabetic polyneuropathy: Secondary | ICD-10-CM | POA: Diagnosis not present

## 2022-06-10 DIAGNOSIS — I872 Venous insufficiency (chronic) (peripheral): Secondary | ICD-10-CM | POA: Diagnosis not present

## 2022-06-10 DIAGNOSIS — S065X0D Traumatic subdural hemorrhage without loss of consciousness, subsequent encounter: Secondary | ICD-10-CM | POA: Diagnosis not present

## 2022-06-10 DIAGNOSIS — E1151 Type 2 diabetes mellitus with diabetic peripheral angiopathy without gangrene: Secondary | ICD-10-CM | POA: Diagnosis not present

## 2022-06-10 DIAGNOSIS — K5730 Diverticulosis of large intestine without perforation or abscess without bleeding: Secondary | ICD-10-CM | POA: Diagnosis not present

## 2022-06-10 DIAGNOSIS — M160 Bilateral primary osteoarthritis of hip: Secondary | ICD-10-CM | POA: Diagnosis not present

## 2022-06-10 DIAGNOSIS — I4821 Permanent atrial fibrillation: Secondary | ICD-10-CM | POA: Diagnosis not present

## 2022-06-10 DIAGNOSIS — R634 Abnormal weight loss: Secondary | ICD-10-CM | POA: Diagnosis not present

## 2022-06-10 DIAGNOSIS — Z7901 Long term (current) use of anticoagulants: Secondary | ICD-10-CM | POA: Diagnosis not present

## 2022-06-10 DIAGNOSIS — I69322 Dysarthria following cerebral infarction: Secondary | ICD-10-CM | POA: Diagnosis not present

## 2022-06-10 DIAGNOSIS — E785 Hyperlipidemia, unspecified: Secondary | ICD-10-CM | POA: Diagnosis not present

## 2022-06-10 DIAGNOSIS — I130 Hypertensive heart and chronic kidney disease with heart failure and stage 1 through stage 4 chronic kidney disease, or unspecified chronic kidney disease: Secondary | ICD-10-CM | POA: Diagnosis not present

## 2022-06-10 DIAGNOSIS — J449 Chronic obstructive pulmonary disease, unspecified: Secondary | ICD-10-CM | POA: Diagnosis not present

## 2022-06-10 DIAGNOSIS — G8929 Other chronic pain: Secondary | ICD-10-CM | POA: Diagnosis not present

## 2022-06-10 DIAGNOSIS — I69351 Hemiplegia and hemiparesis following cerebral infarction affecting right dominant side: Secondary | ICD-10-CM | POA: Diagnosis not present

## 2022-06-10 DIAGNOSIS — M47812 Spondylosis without myelopathy or radiculopathy, cervical region: Secondary | ICD-10-CM | POA: Diagnosis not present

## 2022-06-10 DIAGNOSIS — I4729 Other ventricular tachycardia: Secondary | ICD-10-CM | POA: Diagnosis not present

## 2022-06-10 DIAGNOSIS — N1832 Chronic kidney disease, stage 3b: Secondary | ICD-10-CM | POA: Diagnosis not present

## 2022-06-10 DIAGNOSIS — C9000 Multiple myeloma not having achieved remission: Secondary | ICD-10-CM | POA: Diagnosis not present

## 2022-06-10 DIAGNOSIS — E1122 Type 2 diabetes mellitus with diabetic chronic kidney disease: Secondary | ICD-10-CM | POA: Diagnosis not present

## 2022-06-10 DIAGNOSIS — I5032 Chronic diastolic (congestive) heart failure: Secondary | ICD-10-CM | POA: Diagnosis not present

## 2022-06-10 DIAGNOSIS — I495 Sick sinus syndrome: Secondary | ICD-10-CM | POA: Diagnosis not present

## 2022-06-10 NOTE — Addendum Note (Signed)
Addended by: Brantley Persons A on: 06/10/2022 07:36 AM   Modules accepted: Orders

## 2022-06-11 DIAGNOSIS — I872 Venous insufficiency (chronic) (peripheral): Secondary | ICD-10-CM | POA: Diagnosis not present

## 2022-06-11 DIAGNOSIS — I4821 Permanent atrial fibrillation: Secondary | ICD-10-CM | POA: Diagnosis not present

## 2022-06-11 DIAGNOSIS — I69322 Dysarthria following cerebral infarction: Secondary | ICD-10-CM | POA: Diagnosis not present

## 2022-06-11 DIAGNOSIS — I130 Hypertensive heart and chronic kidney disease with heart failure and stage 1 through stage 4 chronic kidney disease, or unspecified chronic kidney disease: Secondary | ICD-10-CM | POA: Diagnosis not present

## 2022-06-11 DIAGNOSIS — G8929 Other chronic pain: Secondary | ICD-10-CM | POA: Diagnosis not present

## 2022-06-11 DIAGNOSIS — E1142 Type 2 diabetes mellitus with diabetic polyneuropathy: Secondary | ICD-10-CM | POA: Diagnosis not present

## 2022-06-11 DIAGNOSIS — Z7901 Long term (current) use of anticoagulants: Secondary | ICD-10-CM | POA: Diagnosis not present

## 2022-06-11 DIAGNOSIS — S065X0D Traumatic subdural hemorrhage without loss of consciousness, subsequent encounter: Secondary | ICD-10-CM | POA: Diagnosis not present

## 2022-06-11 DIAGNOSIS — I495 Sick sinus syndrome: Secondary | ICD-10-CM | POA: Diagnosis not present

## 2022-06-11 DIAGNOSIS — M160 Bilateral primary osteoarthritis of hip: Secondary | ICD-10-CM | POA: Diagnosis not present

## 2022-06-11 DIAGNOSIS — I69351 Hemiplegia and hemiparesis following cerebral infarction affecting right dominant side: Secondary | ICD-10-CM | POA: Diagnosis not present

## 2022-06-11 DIAGNOSIS — J449 Chronic obstructive pulmonary disease, unspecified: Secondary | ICD-10-CM | POA: Diagnosis not present

## 2022-06-11 DIAGNOSIS — M47812 Spondylosis without myelopathy or radiculopathy, cervical region: Secondary | ICD-10-CM | POA: Diagnosis not present

## 2022-06-11 DIAGNOSIS — I5032 Chronic diastolic (congestive) heart failure: Secondary | ICD-10-CM | POA: Diagnosis not present

## 2022-06-11 DIAGNOSIS — N1832 Chronic kidney disease, stage 3b: Secondary | ICD-10-CM | POA: Diagnosis not present

## 2022-06-11 DIAGNOSIS — E1151 Type 2 diabetes mellitus with diabetic peripheral angiopathy without gangrene: Secondary | ICD-10-CM | POA: Diagnosis not present

## 2022-06-11 DIAGNOSIS — E1122 Type 2 diabetes mellitus with diabetic chronic kidney disease: Secondary | ICD-10-CM | POA: Diagnosis not present

## 2022-06-11 DIAGNOSIS — R634 Abnormal weight loss: Secondary | ICD-10-CM | POA: Diagnosis not present

## 2022-06-11 DIAGNOSIS — I4729 Other ventricular tachycardia: Secondary | ICD-10-CM | POA: Diagnosis not present

## 2022-06-11 DIAGNOSIS — E785 Hyperlipidemia, unspecified: Secondary | ICD-10-CM | POA: Diagnosis not present

## 2022-06-11 DIAGNOSIS — K5730 Diverticulosis of large intestine without perforation or abscess without bleeding: Secondary | ICD-10-CM | POA: Diagnosis not present

## 2022-06-11 DIAGNOSIS — C9000 Multiple myeloma not having achieved remission: Secondary | ICD-10-CM | POA: Diagnosis not present

## 2022-06-13 DIAGNOSIS — G8929 Other chronic pain: Secondary | ICD-10-CM | POA: Diagnosis not present

## 2022-06-13 DIAGNOSIS — I4729 Other ventricular tachycardia: Secondary | ICD-10-CM | POA: Diagnosis not present

## 2022-06-13 DIAGNOSIS — M47812 Spondylosis without myelopathy or radiculopathy, cervical region: Secondary | ICD-10-CM | POA: Diagnosis not present

## 2022-06-13 DIAGNOSIS — E1142 Type 2 diabetes mellitus with diabetic polyneuropathy: Secondary | ICD-10-CM | POA: Diagnosis not present

## 2022-06-13 DIAGNOSIS — E785 Hyperlipidemia, unspecified: Secondary | ICD-10-CM | POA: Diagnosis not present

## 2022-06-13 DIAGNOSIS — C9000 Multiple myeloma not having achieved remission: Secondary | ICD-10-CM | POA: Diagnosis not present

## 2022-06-13 DIAGNOSIS — K5730 Diverticulosis of large intestine without perforation or abscess without bleeding: Secondary | ICD-10-CM | POA: Diagnosis not present

## 2022-06-13 DIAGNOSIS — M160 Bilateral primary osteoarthritis of hip: Secondary | ICD-10-CM | POA: Diagnosis not present

## 2022-06-13 DIAGNOSIS — E1122 Type 2 diabetes mellitus with diabetic chronic kidney disease: Secondary | ICD-10-CM | POA: Diagnosis not present

## 2022-06-13 DIAGNOSIS — R634 Abnormal weight loss: Secondary | ICD-10-CM | POA: Diagnosis not present

## 2022-06-13 DIAGNOSIS — J449 Chronic obstructive pulmonary disease, unspecified: Secondary | ICD-10-CM | POA: Diagnosis not present

## 2022-06-13 DIAGNOSIS — I69322 Dysarthria following cerebral infarction: Secondary | ICD-10-CM | POA: Diagnosis not present

## 2022-06-13 DIAGNOSIS — I5032 Chronic diastolic (congestive) heart failure: Secondary | ICD-10-CM | POA: Diagnosis not present

## 2022-06-13 DIAGNOSIS — E1151 Type 2 diabetes mellitus with diabetic peripheral angiopathy without gangrene: Secondary | ICD-10-CM | POA: Diagnosis not present

## 2022-06-13 DIAGNOSIS — Z7901 Long term (current) use of anticoagulants: Secondary | ICD-10-CM | POA: Diagnosis not present

## 2022-06-13 DIAGNOSIS — S065X0D Traumatic subdural hemorrhage without loss of consciousness, subsequent encounter: Secondary | ICD-10-CM | POA: Diagnosis not present

## 2022-06-13 DIAGNOSIS — I4821 Permanent atrial fibrillation: Secondary | ICD-10-CM | POA: Diagnosis not present

## 2022-06-13 DIAGNOSIS — I69351 Hemiplegia and hemiparesis following cerebral infarction affecting right dominant side: Secondary | ICD-10-CM | POA: Diagnosis not present

## 2022-06-13 DIAGNOSIS — I495 Sick sinus syndrome: Secondary | ICD-10-CM | POA: Diagnosis not present

## 2022-06-13 DIAGNOSIS — N1832 Chronic kidney disease, stage 3b: Secondary | ICD-10-CM | POA: Diagnosis not present

## 2022-06-13 DIAGNOSIS — I872 Venous insufficiency (chronic) (peripheral): Secondary | ICD-10-CM | POA: Diagnosis not present

## 2022-06-13 DIAGNOSIS — I130 Hypertensive heart and chronic kidney disease with heart failure and stage 1 through stage 4 chronic kidney disease, or unspecified chronic kidney disease: Secondary | ICD-10-CM | POA: Diagnosis not present

## 2022-06-16 ENCOUNTER — Telehealth: Payer: Self-pay | Admitting: Internal Medicine

## 2022-06-16 DIAGNOSIS — E1151 Type 2 diabetes mellitus with diabetic peripheral angiopathy without gangrene: Secondary | ICD-10-CM | POA: Diagnosis not present

## 2022-06-16 DIAGNOSIS — I69351 Hemiplegia and hemiparesis following cerebral infarction affecting right dominant side: Secondary | ICD-10-CM | POA: Diagnosis not present

## 2022-06-16 DIAGNOSIS — I4821 Permanent atrial fibrillation: Secondary | ICD-10-CM | POA: Diagnosis not present

## 2022-06-16 DIAGNOSIS — S065X0D Traumatic subdural hemorrhage without loss of consciousness, subsequent encounter: Secondary | ICD-10-CM | POA: Diagnosis not present

## 2022-06-16 DIAGNOSIS — E785 Hyperlipidemia, unspecified: Secondary | ICD-10-CM | POA: Diagnosis not present

## 2022-06-16 DIAGNOSIS — I69322 Dysarthria following cerebral infarction: Secondary | ICD-10-CM | POA: Diagnosis not present

## 2022-06-16 DIAGNOSIS — C9000 Multiple myeloma not having achieved remission: Secondary | ICD-10-CM | POA: Diagnosis not present

## 2022-06-16 DIAGNOSIS — J449 Chronic obstructive pulmonary disease, unspecified: Secondary | ICD-10-CM | POA: Diagnosis not present

## 2022-06-16 DIAGNOSIS — R634 Abnormal weight loss: Secondary | ICD-10-CM | POA: Diagnosis not present

## 2022-06-16 DIAGNOSIS — N1832 Chronic kidney disease, stage 3b: Secondary | ICD-10-CM | POA: Diagnosis not present

## 2022-06-16 DIAGNOSIS — Z7901 Long term (current) use of anticoagulants: Secondary | ICD-10-CM | POA: Diagnosis not present

## 2022-06-16 DIAGNOSIS — E1142 Type 2 diabetes mellitus with diabetic polyneuropathy: Secondary | ICD-10-CM | POA: Diagnosis not present

## 2022-06-16 DIAGNOSIS — I4729 Other ventricular tachycardia: Secondary | ICD-10-CM | POA: Diagnosis not present

## 2022-06-16 DIAGNOSIS — K5730 Diverticulosis of large intestine without perforation or abscess without bleeding: Secondary | ICD-10-CM | POA: Diagnosis not present

## 2022-06-16 DIAGNOSIS — M160 Bilateral primary osteoarthritis of hip: Secondary | ICD-10-CM | POA: Diagnosis not present

## 2022-06-16 DIAGNOSIS — M47812 Spondylosis without myelopathy or radiculopathy, cervical region: Secondary | ICD-10-CM | POA: Diagnosis not present

## 2022-06-16 DIAGNOSIS — I5032 Chronic diastolic (congestive) heart failure: Secondary | ICD-10-CM | POA: Diagnosis not present

## 2022-06-16 DIAGNOSIS — I130 Hypertensive heart and chronic kidney disease with heart failure and stage 1 through stage 4 chronic kidney disease, or unspecified chronic kidney disease: Secondary | ICD-10-CM | POA: Diagnosis not present

## 2022-06-16 DIAGNOSIS — I495 Sick sinus syndrome: Secondary | ICD-10-CM | POA: Diagnosis not present

## 2022-06-16 DIAGNOSIS — I872 Venous insufficiency (chronic) (peripheral): Secondary | ICD-10-CM | POA: Diagnosis not present

## 2022-06-16 DIAGNOSIS — G8929 Other chronic pain: Secondary | ICD-10-CM | POA: Diagnosis not present

## 2022-06-16 DIAGNOSIS — E1122 Type 2 diabetes mellitus with diabetic chronic kidney disease: Secondary | ICD-10-CM | POA: Diagnosis not present

## 2022-06-16 MED ORDER — AMPICILLIN 500 MG PO CAPS: 500.0000 mg | ORAL_CAPSULE | Freq: Four times a day (QID) | ORAL | 0 refills | Status: DC

## 2022-06-16 NOTE — Telephone Encounter (Signed)
Please to let pt AND FAMILY know -   We received urine culture which showed an infection  I will sent antibx now.

## 2022-06-17 DIAGNOSIS — I4821 Permanent atrial fibrillation: Secondary | ICD-10-CM | POA: Diagnosis not present

## 2022-06-17 DIAGNOSIS — G8929 Other chronic pain: Secondary | ICD-10-CM | POA: Diagnosis not present

## 2022-06-17 DIAGNOSIS — M47812 Spondylosis without myelopathy or radiculopathy, cervical region: Secondary | ICD-10-CM | POA: Diagnosis not present

## 2022-06-17 DIAGNOSIS — I69351 Hemiplegia and hemiparesis following cerebral infarction affecting right dominant side: Secondary | ICD-10-CM | POA: Diagnosis not present

## 2022-06-17 DIAGNOSIS — I495 Sick sinus syndrome: Secondary | ICD-10-CM | POA: Diagnosis not present

## 2022-06-17 DIAGNOSIS — N1832 Chronic kidney disease, stage 3b: Secondary | ICD-10-CM | POA: Diagnosis not present

## 2022-06-17 DIAGNOSIS — J449 Chronic obstructive pulmonary disease, unspecified: Secondary | ICD-10-CM | POA: Diagnosis not present

## 2022-06-17 DIAGNOSIS — E1142 Type 2 diabetes mellitus with diabetic polyneuropathy: Secondary | ICD-10-CM | POA: Diagnosis not present

## 2022-06-17 DIAGNOSIS — I5032 Chronic diastolic (congestive) heart failure: Secondary | ICD-10-CM | POA: Diagnosis not present

## 2022-06-17 DIAGNOSIS — Z7901 Long term (current) use of anticoagulants: Secondary | ICD-10-CM | POA: Diagnosis not present

## 2022-06-17 DIAGNOSIS — I69322 Dysarthria following cerebral infarction: Secondary | ICD-10-CM | POA: Diagnosis not present

## 2022-06-17 DIAGNOSIS — S065X0D Traumatic subdural hemorrhage without loss of consciousness, subsequent encounter: Secondary | ICD-10-CM | POA: Diagnosis not present

## 2022-06-17 DIAGNOSIS — M160 Bilateral primary osteoarthritis of hip: Secondary | ICD-10-CM | POA: Diagnosis not present

## 2022-06-17 DIAGNOSIS — E785 Hyperlipidemia, unspecified: Secondary | ICD-10-CM | POA: Diagnosis not present

## 2022-06-17 DIAGNOSIS — K5730 Diverticulosis of large intestine without perforation or abscess without bleeding: Secondary | ICD-10-CM | POA: Diagnosis not present

## 2022-06-17 DIAGNOSIS — R634 Abnormal weight loss: Secondary | ICD-10-CM | POA: Diagnosis not present

## 2022-06-17 DIAGNOSIS — I4729 Other ventricular tachycardia: Secondary | ICD-10-CM | POA: Diagnosis not present

## 2022-06-17 DIAGNOSIS — I130 Hypertensive heart and chronic kidney disease with heart failure and stage 1 through stage 4 chronic kidney disease, or unspecified chronic kidney disease: Secondary | ICD-10-CM | POA: Diagnosis not present

## 2022-06-17 DIAGNOSIS — E1122 Type 2 diabetes mellitus with diabetic chronic kidney disease: Secondary | ICD-10-CM | POA: Diagnosis not present

## 2022-06-17 DIAGNOSIS — C9000 Multiple myeloma not having achieved remission: Secondary | ICD-10-CM | POA: Diagnosis not present

## 2022-06-17 DIAGNOSIS — E1151 Type 2 diabetes mellitus with diabetic peripheral angiopathy without gangrene: Secondary | ICD-10-CM | POA: Diagnosis not present

## 2022-06-17 DIAGNOSIS — I872 Venous insufficiency (chronic) (peripheral): Secondary | ICD-10-CM | POA: Diagnosis not present

## 2022-06-17 NOTE — Telephone Encounter (Signed)
Patient informed of urine culture results and abx that was sent to pharmacy.

## 2022-06-18 DIAGNOSIS — I130 Hypertensive heart and chronic kidney disease with heart failure and stage 1 through stage 4 chronic kidney disease, or unspecified chronic kidney disease: Secondary | ICD-10-CM | POA: Diagnosis not present

## 2022-06-18 DIAGNOSIS — M160 Bilateral primary osteoarthritis of hip: Secondary | ICD-10-CM | POA: Diagnosis not present

## 2022-06-18 DIAGNOSIS — G8929 Other chronic pain: Secondary | ICD-10-CM | POA: Diagnosis not present

## 2022-06-18 DIAGNOSIS — I872 Venous insufficiency (chronic) (peripheral): Secondary | ICD-10-CM | POA: Diagnosis not present

## 2022-06-18 DIAGNOSIS — I4729 Other ventricular tachycardia: Secondary | ICD-10-CM | POA: Diagnosis not present

## 2022-06-18 DIAGNOSIS — I495 Sick sinus syndrome: Secondary | ICD-10-CM | POA: Diagnosis not present

## 2022-06-18 DIAGNOSIS — E1122 Type 2 diabetes mellitus with diabetic chronic kidney disease: Secondary | ICD-10-CM | POA: Diagnosis not present

## 2022-06-18 DIAGNOSIS — S065X0D Traumatic subdural hemorrhage without loss of consciousness, subsequent encounter: Secondary | ICD-10-CM | POA: Diagnosis not present

## 2022-06-18 DIAGNOSIS — M47812 Spondylosis without myelopathy or radiculopathy, cervical region: Secondary | ICD-10-CM | POA: Diagnosis not present

## 2022-06-18 DIAGNOSIS — C9000 Multiple myeloma not having achieved remission: Secondary | ICD-10-CM | POA: Diagnosis not present

## 2022-06-18 DIAGNOSIS — Z7901 Long term (current) use of anticoagulants: Secondary | ICD-10-CM | POA: Diagnosis not present

## 2022-06-18 DIAGNOSIS — I5032 Chronic diastolic (congestive) heart failure: Secondary | ICD-10-CM | POA: Diagnosis not present

## 2022-06-18 DIAGNOSIS — I69351 Hemiplegia and hemiparesis following cerebral infarction affecting right dominant side: Secondary | ICD-10-CM | POA: Diagnosis not present

## 2022-06-18 DIAGNOSIS — E785 Hyperlipidemia, unspecified: Secondary | ICD-10-CM | POA: Diagnosis not present

## 2022-06-18 DIAGNOSIS — J449 Chronic obstructive pulmonary disease, unspecified: Secondary | ICD-10-CM | POA: Diagnosis not present

## 2022-06-18 DIAGNOSIS — R634 Abnormal weight loss: Secondary | ICD-10-CM | POA: Diagnosis not present

## 2022-06-18 DIAGNOSIS — E1151 Type 2 diabetes mellitus with diabetic peripheral angiopathy without gangrene: Secondary | ICD-10-CM | POA: Diagnosis not present

## 2022-06-18 DIAGNOSIS — K5730 Diverticulosis of large intestine without perforation or abscess without bleeding: Secondary | ICD-10-CM | POA: Diagnosis not present

## 2022-06-18 DIAGNOSIS — I69322 Dysarthria following cerebral infarction: Secondary | ICD-10-CM | POA: Diagnosis not present

## 2022-06-18 DIAGNOSIS — E1142 Type 2 diabetes mellitus with diabetic polyneuropathy: Secondary | ICD-10-CM | POA: Diagnosis not present

## 2022-06-18 DIAGNOSIS — I4821 Permanent atrial fibrillation: Secondary | ICD-10-CM | POA: Diagnosis not present

## 2022-06-18 DIAGNOSIS — N1832 Chronic kidney disease, stage 3b: Secondary | ICD-10-CM | POA: Diagnosis not present

## 2022-06-20 DIAGNOSIS — Z7901 Long term (current) use of anticoagulants: Secondary | ICD-10-CM | POA: Diagnosis not present

## 2022-06-20 DIAGNOSIS — J449 Chronic obstructive pulmonary disease, unspecified: Secondary | ICD-10-CM | POA: Diagnosis not present

## 2022-06-20 DIAGNOSIS — I495 Sick sinus syndrome: Secondary | ICD-10-CM | POA: Diagnosis not present

## 2022-06-20 DIAGNOSIS — I130 Hypertensive heart and chronic kidney disease with heart failure and stage 1 through stage 4 chronic kidney disease, or unspecified chronic kidney disease: Secondary | ICD-10-CM | POA: Diagnosis not present

## 2022-06-20 DIAGNOSIS — E1142 Type 2 diabetes mellitus with diabetic polyneuropathy: Secondary | ICD-10-CM | POA: Diagnosis not present

## 2022-06-20 DIAGNOSIS — I4821 Permanent atrial fibrillation: Secondary | ICD-10-CM | POA: Diagnosis not present

## 2022-06-20 DIAGNOSIS — M47812 Spondylosis without myelopathy or radiculopathy, cervical region: Secondary | ICD-10-CM | POA: Diagnosis not present

## 2022-06-20 DIAGNOSIS — M160 Bilateral primary osteoarthritis of hip: Secondary | ICD-10-CM | POA: Diagnosis not present

## 2022-06-20 DIAGNOSIS — N1832 Chronic kidney disease, stage 3b: Secondary | ICD-10-CM | POA: Diagnosis not present

## 2022-06-20 DIAGNOSIS — S065X0D Traumatic subdural hemorrhage without loss of consciousness, subsequent encounter: Secondary | ICD-10-CM | POA: Diagnosis not present

## 2022-06-20 DIAGNOSIS — I5032 Chronic diastolic (congestive) heart failure: Secondary | ICD-10-CM | POA: Diagnosis not present

## 2022-06-20 DIAGNOSIS — I69322 Dysarthria following cerebral infarction: Secondary | ICD-10-CM | POA: Diagnosis not present

## 2022-06-20 DIAGNOSIS — E1122 Type 2 diabetes mellitus with diabetic chronic kidney disease: Secondary | ICD-10-CM | POA: Diagnosis not present

## 2022-06-20 DIAGNOSIS — K5730 Diverticulosis of large intestine without perforation or abscess without bleeding: Secondary | ICD-10-CM | POA: Diagnosis not present

## 2022-06-20 DIAGNOSIS — G8929 Other chronic pain: Secondary | ICD-10-CM | POA: Diagnosis not present

## 2022-06-20 DIAGNOSIS — C9000 Multiple myeloma not having achieved remission: Secondary | ICD-10-CM | POA: Diagnosis not present

## 2022-06-20 DIAGNOSIS — E1151 Type 2 diabetes mellitus with diabetic peripheral angiopathy without gangrene: Secondary | ICD-10-CM | POA: Diagnosis not present

## 2022-06-20 DIAGNOSIS — I69351 Hemiplegia and hemiparesis following cerebral infarction affecting right dominant side: Secondary | ICD-10-CM | POA: Diagnosis not present

## 2022-06-20 DIAGNOSIS — I4729 Other ventricular tachycardia: Secondary | ICD-10-CM | POA: Diagnosis not present

## 2022-06-20 DIAGNOSIS — R634 Abnormal weight loss: Secondary | ICD-10-CM | POA: Diagnosis not present

## 2022-06-20 DIAGNOSIS — E785 Hyperlipidemia, unspecified: Secondary | ICD-10-CM | POA: Diagnosis not present

## 2022-06-20 DIAGNOSIS — I872 Venous insufficiency (chronic) (peripheral): Secondary | ICD-10-CM | POA: Diagnosis not present

## 2022-06-24 DIAGNOSIS — J449 Chronic obstructive pulmonary disease, unspecified: Secondary | ICD-10-CM | POA: Diagnosis not present

## 2022-06-24 DIAGNOSIS — M160 Bilateral primary osteoarthritis of hip: Secondary | ICD-10-CM | POA: Diagnosis not present

## 2022-06-24 DIAGNOSIS — I5032 Chronic diastolic (congestive) heart failure: Secondary | ICD-10-CM | POA: Diagnosis not present

## 2022-06-24 DIAGNOSIS — E1122 Type 2 diabetes mellitus with diabetic chronic kidney disease: Secondary | ICD-10-CM | POA: Diagnosis not present

## 2022-06-24 DIAGNOSIS — N1832 Chronic kidney disease, stage 3b: Secondary | ICD-10-CM | POA: Diagnosis not present

## 2022-06-24 DIAGNOSIS — S065X0D Traumatic subdural hemorrhage without loss of consciousness, subsequent encounter: Secondary | ICD-10-CM | POA: Diagnosis not present

## 2022-06-24 DIAGNOSIS — E1142 Type 2 diabetes mellitus with diabetic polyneuropathy: Secondary | ICD-10-CM | POA: Diagnosis not present

## 2022-06-24 DIAGNOSIS — I4821 Permanent atrial fibrillation: Secondary | ICD-10-CM | POA: Diagnosis not present

## 2022-06-24 DIAGNOSIS — I872 Venous insufficiency (chronic) (peripheral): Secondary | ICD-10-CM | POA: Diagnosis not present

## 2022-06-24 DIAGNOSIS — K5730 Diverticulosis of large intestine without perforation or abscess without bleeding: Secondary | ICD-10-CM | POA: Diagnosis not present

## 2022-06-24 DIAGNOSIS — Z7901 Long term (current) use of anticoagulants: Secondary | ICD-10-CM | POA: Diagnosis not present

## 2022-06-24 DIAGNOSIS — I69351 Hemiplegia and hemiparesis following cerebral infarction affecting right dominant side: Secondary | ICD-10-CM | POA: Diagnosis not present

## 2022-06-24 DIAGNOSIS — E785 Hyperlipidemia, unspecified: Secondary | ICD-10-CM | POA: Diagnosis not present

## 2022-06-24 DIAGNOSIS — I69322 Dysarthria following cerebral infarction: Secondary | ICD-10-CM | POA: Diagnosis not present

## 2022-06-24 DIAGNOSIS — G8929 Other chronic pain: Secondary | ICD-10-CM | POA: Diagnosis not present

## 2022-06-24 DIAGNOSIS — C9000 Multiple myeloma not having achieved remission: Secondary | ICD-10-CM | POA: Diagnosis not present

## 2022-06-24 DIAGNOSIS — M47812 Spondylosis without myelopathy or radiculopathy, cervical region: Secondary | ICD-10-CM | POA: Diagnosis not present

## 2022-06-24 DIAGNOSIS — I4729 Other ventricular tachycardia: Secondary | ICD-10-CM | POA: Diagnosis not present

## 2022-06-24 DIAGNOSIS — R634 Abnormal weight loss: Secondary | ICD-10-CM | POA: Diagnosis not present

## 2022-06-24 DIAGNOSIS — E1151 Type 2 diabetes mellitus with diabetic peripheral angiopathy without gangrene: Secondary | ICD-10-CM | POA: Diagnosis not present

## 2022-06-24 DIAGNOSIS — I130 Hypertensive heart and chronic kidney disease with heart failure and stage 1 through stage 4 chronic kidney disease, or unspecified chronic kidney disease: Secondary | ICD-10-CM | POA: Diagnosis not present

## 2022-06-24 DIAGNOSIS — I495 Sick sinus syndrome: Secondary | ICD-10-CM | POA: Diagnosis not present

## 2022-06-25 ENCOUNTER — Ambulatory Visit (INDEPENDENT_AMBULATORY_CARE_PROVIDER_SITE_OTHER): Payer: Medicare Other | Admitting: Internal Medicine

## 2022-06-25 ENCOUNTER — Telehealth: Payer: Self-pay | Admitting: Internal Medicine

## 2022-06-25 VITALS — BP 136/80 | HR 56 | Temp 97.8°F | Ht 73.0 in | Wt 225.0 lb

## 2022-06-25 DIAGNOSIS — I495 Sick sinus syndrome: Secondary | ICD-10-CM | POA: Diagnosis not present

## 2022-06-25 DIAGNOSIS — R410 Disorientation, unspecified: Secondary | ICD-10-CM | POA: Diagnosis not present

## 2022-06-25 DIAGNOSIS — R634 Abnormal weight loss: Secondary | ICD-10-CM | POA: Diagnosis not present

## 2022-06-25 DIAGNOSIS — C9000 Multiple myeloma not having achieved remission: Secondary | ICD-10-CM | POA: Diagnosis not present

## 2022-06-25 DIAGNOSIS — I872 Venous insufficiency (chronic) (peripheral): Secondary | ICD-10-CM | POA: Diagnosis not present

## 2022-06-25 DIAGNOSIS — N390 Urinary tract infection, site not specified: Secondary | ICD-10-CM

## 2022-06-25 DIAGNOSIS — M160 Bilateral primary osteoarthritis of hip: Secondary | ICD-10-CM | POA: Diagnosis not present

## 2022-06-25 DIAGNOSIS — I130 Hypertensive heart and chronic kidney disease with heart failure and stage 1 through stage 4 chronic kidney disease, or unspecified chronic kidney disease: Secondary | ICD-10-CM | POA: Diagnosis not present

## 2022-06-25 DIAGNOSIS — M47812 Spondylosis without myelopathy or radiculopathy, cervical region: Secondary | ICD-10-CM | POA: Diagnosis not present

## 2022-06-25 DIAGNOSIS — J449 Chronic obstructive pulmonary disease, unspecified: Secondary | ICD-10-CM | POA: Diagnosis not present

## 2022-06-25 DIAGNOSIS — E1142 Type 2 diabetes mellitus with diabetic polyneuropathy: Secondary | ICD-10-CM | POA: Diagnosis not present

## 2022-06-25 DIAGNOSIS — I69322 Dysarthria following cerebral infarction: Secondary | ICD-10-CM | POA: Diagnosis not present

## 2022-06-25 DIAGNOSIS — S065X0D Traumatic subdural hemorrhage without loss of consciousness, subsequent encounter: Secondary | ICD-10-CM | POA: Diagnosis not present

## 2022-06-25 DIAGNOSIS — I5032 Chronic diastolic (congestive) heart failure: Secondary | ICD-10-CM | POA: Diagnosis not present

## 2022-06-25 DIAGNOSIS — I4821 Permanent atrial fibrillation: Secondary | ICD-10-CM | POA: Diagnosis not present

## 2022-06-25 DIAGNOSIS — I10 Essential (primary) hypertension: Secondary | ICD-10-CM

## 2022-06-25 DIAGNOSIS — E1122 Type 2 diabetes mellitus with diabetic chronic kidney disease: Secondary | ICD-10-CM | POA: Diagnosis not present

## 2022-06-25 DIAGNOSIS — E1151 Type 2 diabetes mellitus with diabetic peripheral angiopathy without gangrene: Secondary | ICD-10-CM | POA: Diagnosis not present

## 2022-06-25 DIAGNOSIS — I4729 Other ventricular tachycardia: Secondary | ICD-10-CM | POA: Diagnosis not present

## 2022-06-25 DIAGNOSIS — E119 Type 2 diabetes mellitus without complications: Secondary | ICD-10-CM

## 2022-06-25 DIAGNOSIS — Z7901 Long term (current) use of anticoagulants: Secondary | ICD-10-CM | POA: Diagnosis not present

## 2022-06-25 DIAGNOSIS — K5730 Diverticulosis of large intestine without perforation or abscess without bleeding: Secondary | ICD-10-CM | POA: Diagnosis not present

## 2022-06-25 DIAGNOSIS — N1832 Chronic kidney disease, stage 3b: Secondary | ICD-10-CM | POA: Diagnosis not present

## 2022-06-25 DIAGNOSIS — E785 Hyperlipidemia, unspecified: Secondary | ICD-10-CM | POA: Diagnosis not present

## 2022-06-25 DIAGNOSIS — G8929 Other chronic pain: Secondary | ICD-10-CM | POA: Diagnosis not present

## 2022-06-25 DIAGNOSIS — I69351 Hemiplegia and hemiparesis following cerebral infarction affecting right dominant side: Secondary | ICD-10-CM | POA: Diagnosis not present

## 2022-06-25 LAB — URINALYSIS, ROUTINE W REFLEX MICROSCOPIC
Bilirubin Urine: NEGATIVE
Ketones, ur: NEGATIVE
Leukocytes,Ua: NEGATIVE
Nitrite: NEGATIVE
Specific Gravity, Urine: 1.015 (ref 1.000–1.030)
Total Protein, Urine: >=300 — AB
Urine Glucose: NEGATIVE
Urobilinogen, UA: 0.2 (ref 0.0–1.0)
pH: 7 (ref 5.0–8.0)

## 2022-06-25 NOTE — Telephone Encounter (Signed)
[place] for verbals, but also ok to give the antibiotic without food - no new or changed antibiotic is needed

## 2022-06-25 NOTE — Patient Instructions (Addendum)
Please continue all other medications as before, including the ampicillin  Please have the pharmacy call with any other refills you may need.  Please continue your efforts at being more active, low cholesterol diet, and weight control  Please keep your appointments with your specialists as you may have planned  Please go to the LAB at the blood drawing area for the tests to be done - just the urine testing today  You will be contacted by phone if any changes need to be made immediately.  Otherwise, you will receive a letter about your results with an explanation, but please check with MyChart first.  Please remember to sign up for MyChart if you have not done so, as this will be important to you in the future with finding out test results, communicating by private email, and scheduling acute appointments online when needed.  Please make an Appointment to return in 3 months, or sooner if needed

## 2022-06-25 NOTE — Telephone Encounter (Signed)
Tillie Rung from [facility] advised for verbal orders. Patient informed about abx and is scheduled for an office visit today.

## 2022-06-25 NOTE — Progress Notes (Signed)
Patient ID: [name], male   DOB: [birth year], 76 y.o.   MRN: [id number]        Chief Complaint: follow up uti, confusion, dm, htn       HPI:  [name] is a 76 y.o. male here after recent concern per PT regarding pt seeming to have impaired cognition, also found to have recent UTI - was tx with ampicillin given the sens's and has tolerated well, but daughter admits pt not always getting each dose in the past 3 days due to instructions about taking with food, and pt is not eating well.  Pt denies chest pain, increased sob or doe, wheezing, orthopnea, PND, increased LE swelling, palpitations, dizziness or syncope.   Pt denies polydipsia, polyuria, or new focal neuro s/s.    Pt denies fever, wt loss, night sweats, loss of appetite, or other constitutional symptoms  Denies urinary symptoms such as dysuria, frequency, urgency, flank pain, hematuria or n/v, fever, chills.  Daughter feels he is at baseline mentally today       Wt Readings from Last 3 Encounters:  06/25/22 225 lb (102.1 kg)  06/03/22 234 lb 12.8 oz (106.5 kg)  04/17/22 257 lb (116.6 kg)   BP Readings from Last 3 Encounters:  06/25/22 136/80  06/03/22 (!) 139/97  04/30/22 118/62         Past Medical History:  Diagnosis Date   Adenoma 05/2008   Alcoholism in recovery [facility])    BPH (benign prostatic hyperplasia)    Cancer (HCC)    Prostate   Chronic LBP    Hip & Back -- Sees Dr. Maia Petties @ [place]   CKD (chronic kidney disease)    with AKI requiring brief CRRT 02/2020   Colon polyps    Complex renal cyst 12/2010   Controlled type 2 diabetes mellitus with neuropathy ([place]) 01/2007   COPD (chronic obstructive pulmonary disease) ([place])    Diabetes ([place])    Diverticulitis of colon    ED (erectile dysfunction)    s/p Penile prosthesis (09/2010)   History of prostate cancer    Dr. Karsten Ro   History of sick sinus syndrome    reduced BB dose for Bradycardia   HLD (hyperlipidemia)    Hypertension     Impaired glucose tolerance 12/21/2013   Nephrolithiasis    Osteoarthritis of both hips    Osteoarthritis of lumbosacral spine    with Disc Disease   PAF (paroxysmal atrial fibrillation) (HCC)    No longer on Amiodarone.  Not on Anticoaguation b/c no recurrence.   Paresthesias/numbness    Bilateral LE   Peripheral neuropathy 12/21/2013   Stroke [facility])    left occipital CVA ~ 2018; possible TIA 01/24/22   Subdural hematoma ([place]) 04/04/2022   Anticoagulation stopped   Venous insufficiency    Past Surgical History:  Procedure Laterality Date   IR ANGIO INTRA EXTRACRAN SEL COM CAROTID INNOMINATE UNI R MOD SED  04/17/2022   LEFT HEART CATH AND CORONARY ANGIOGRAPHY  2003   Normal Coronary Arteries.   NM MYOVIEW LTD  02/21/2020   EF 60%.  Medium sized mild severity defect in the basal inferior, mid inferior and apical inferior location-suggesting of ischemia.  Read as low-intermediate risk.  (On cardiology review this is felt to be a fixed defect either related to prior infarct versus diaphragmatic attenuation.  Felt to be low risk)   PENILE PROSTHESIS IMPLANT  06/21/2012   Procedure: PENILE PROTHESIS INFLATABLE;  Surgeon: Claybon Jabs,  MD;  Location: [place];  Service: Urology;  Laterality: N/A;  REMOVAL AND REPLACEMENT OF SOME  OF PROSTHESIS ([facility])    PENILE PROSTHESIS PLACEMENT  09/2010   PROSTATECTOMY     RADIOLOGY WITH ANESTHESIA N/A 04/17/2022   Procedure: Right middle meningeal artery embolization;  Surgeon: Consuella Lose, MD;  Location: [place];  Service: Radiology;  Laterality: N/A;   REMOVAL OF PENILE PROSTHESIS N/A 02/15/2018   Procedure: REMOVAL OF PENILE PROSTHESIS;  Surgeon: Kathie Rhodes, MD;  Location: [facility];  Service: Urology;  Laterality: N/A;   TRANSTHORACIC ECHOCARDIOGRAM  01/2022   EF 70-75%:Marland Kitchen  Hyperdynamic LV with no RWMA.  Mild LVH.  Severely dilated left atrium suggesting notable diastolic dysfunction, but unable to interpret.  Mildly enlarged  RV with normal function.  Normal PAP despite moderate dilated RA and increased RAP/CVP.Marland Kitchen  No MS or MR.  No AAS or AI.  Aortic root measuring 42 mm with ascending aorta measuring 41 mm.   TRANSTHORACIC ECHOCARDIOGRAM  02/17/2020   EF 60 to 65%.  No R WMA.  Unable to assess diastolic parameters because of A. fib.  Mildly elevated PA pressures.  Mild LA dilation.  Mild aortic valve sclerosis but no stenosis.  Normal IVC.    reports that he quit smoking about 39 years ago. His smoking use included cigarettes. He has never used smokeless tobacco. He reports that he does not currently use alcohol. He reports that he does not use drugs. family history includes Coronary artery disease in his father and mother; Diabetes in his father; Heart attack in his mother; Prostate cancer in his father. Allergies  Allergen Reactions   Ciprofloxacin Other (See Comments)    All over weakness   Current Outpatient Medications on File Prior to Visit  Medication Sig Dispense Refill   acetaminophen (TYLENOL) 500 MG tablet Take 1,000 mg by mouth every 8 (eight) hours as needed for headache.     acetaminophen (TYLENOL) 650 MG CR tablet Take 1,300 mg by mouth every 8 (eight) hours as needed for pain.     ampicillin (PRINCIPEN) 500 MG capsule Take 1 capsule (500 mg total) by mouth 4 (four) times daily. 40 capsule 0   Blood Glucose Monitoring Suppl (ACCU-CHEK GUIDE ME) w/Device KIT Use as directed twice per day E11.9 1 kit 0   carvedilol (COREG) 3.125 MG tablet Take 1 tablet (3.125 mg total) by mouth 2 (two) times daily with a meal. 180 tablet 3   furosemide (LASIX) 40 MG tablet TAKE 1 TABLET BY MOUTH DAILY .  MAY TAKE AN ADDITIONAL 1 TABLET  BY MOUTH IF NEEDED FOR SWELLING 200 tablet 1   glucose blood (ACCU-CHEK GUIDE) test strip Use as instructed twice per day E11.9 200 each 12   Lancet Devices (ONETOUCH DELICA PLUS LANCING) MISC      Lancets (ONETOUCH DELICA PLUS [id number]) MISC USE ONCE DAILY 100 each 3   lidocaine  (LIDODERM) 5 % Place 1 patch onto the skin daily. Remove & Discard patch within 12 hours or as directed by MD 30 patch 0   ondansetron (ZOFRAN-ODT) 4 MG disintegrating tablet Take 1 tablet (4 mg total) by mouth every 8 (eight) hours as needed for nausea or vomiting. 24 tablet 1   potassium chloride (KLOR-CON) 10 MEQ tablet Take 1 tablet (10 mEq total) by mouth daily. 100 tablet 3   PROAIR HFA 108 (90 Base) MCG/ACT inhaler INHALE 2 PUFFS INTO THE  LUNGS EVERY 6 HOURS AS  NEEDED (Patient taking  differently: Inhale 2 puffs into the lungs every 6 (six) hours as needed for shortness of breath or wheezing.) 34 g 3   prochlorperazine (COMPAZINE) 10 MG tablet Take 1 tablet (10 mg total) by mouth every 6 (six) hours as needed for nausea or vomiting (or headache). 20 tablet 0   rosuvastatin (CRESTOR) 20 MG tablet Take 20 mg by mouth every evening.     sulfamethoxazole-trimethoprim (BACTRIM DS) 800-160 MG tablet Take 1 tablet by mouth 2 (two) times daily. (Patient not taking: Reported on 06/03/2022) 20 tablet 0   No current facility-administered medications on file prior to visit.        ROS:  All others reviewed and negative.  Objective        PE:  BP 136/80 (BP Location: Right Arm, Patient Position: Sitting, Cuff Size: Large)   Pulse (!) 56   Temp 97.8 F (36.6 C) (Oral)   Ht _0  (1.854 m)   Wt 225 lb (102.1 kg)   SpO2 99%   BMI 29.69 kg/m                 Constitutional: Pt appears in NAD but thinner, fatigued and slumped in wheelchair               HENT: Head: NCAT.                Right Ear: External ear normal.                 Left Ear: External ear normal.                Eyes: . Pupils are equal, round, and reactive to light. Conjunctivae and EOM are normal               Nose: without d/c or deformity               Neck: Neck supple. Gross normal ROM               Cardiovascular: Normal rate and regular rhythm.                 Pulmonary/Chest: Effort normal and breath sounds without rales  or wheezing.                Abd:  Soft, NT, ND, + BS, no organomegaly               Neurological: Pt is alert. At baseline orientation, motor grossly intact               Skin: Skin is warm. No rashes, no other new lesions, LE edema - none               Psychiatric: Pt behavior is normal without agitation , cognitioin appears to be intact in comprehension and expressing his answers  Micro: none  Cardiac tracings I have personally interpreted today:  none  Pertinent Radiological findings (summarize): none   Lab Results  Component Value Date   WBC 7.4 04/30/2022   HGB 13.9 04/30/2022   HCT 42.5 04/30/2022   PLT 166.0 04/30/2022   GLUCOSE 102 (H) 04/30/2022   CHOL 102 03/10/2022   TRIG 77.0 03/10/2022   HDL 29.10 (L) 03/10/2022   LDLCALC 57 03/10/2022   ALT 12 04/30/2022   AST 20 04/30/2022   NA 136 04/30/2022   K 4.2 04/30/2022   CL 101 04/30/2022   CREATININE 1.56 (H) 04/30/2022   BUN 22 04/30/2022  CO2 26 04/30/2022   TSH 1.363 01/24/2022   PSA 0.00 (L) 11/18/2021   INR 1.1 04/10/2022   HGBA1C 7.0 (H) 03/10/2022   MICROALBUR 176.5 (H) 11/18/2021   Assessment/Plan:  Laith Molinelli is a 77 y.o. Black or African American [2] male with  has a past medical history of Adenoma (05/2008), Alcoholism in recovery [facility]), BPH (benign prostatic hyperplasia), Cancer ([place]), Chronic LBP, CKD (chronic kidney disease), Colon polyps, Complex renal cyst (12/2010), Controlled type 2 diabetes mellitus with neuropathy ([place]) (01/2007), COPD (chronic obstructive pulmonary disease) ([place]), Diabetes ([place]), Diverticulitis of colon, ED (erectile dysfunction), History of prostate cancer, History of sick sinus syndrome, HLD (hyperlipidemia), Hypertension, Impaired glucose tolerance (12/21/2013), Nephrolithiasis, Osteoarthritis of both hips, Osteoarthritis of lumbosacral spine, PAF (paroxysmal atrial fibrillation) ([place]), Paresthesias/numbness, Peripheral neuropathy (12/21/2013), Stroke ([place]), Subdural  hematoma ([place]) (04/04/2022), and Venous insufficiency.  Diabetes mellitus type II, non insulin dependent ([place]) Lab Results  Component Value Date   HGBA1C 7.0 (H) 03/10/2022   Stable, pt to continue current medical treatment  - diet, wt control, excercise  HTN (hypertension) BP Readings from Last 3 Encounters:  06/25/22 136/80  06/03/22 (!) 139/97  04/30/22 118/62   Stable, pt to continue medical treatment coreg 3.125 bid    Urinary tract infection without hematuria Abnormal culture last wk (which I cannot find scanned in to the system yet) with several resistants but ampicillin symptomatic.  OK to finish ampicillin as rx, without regard for food.  For repeat ua and culture today but already seems improved   Confusion I suspect some evidence per PT of encephalopathy related to the UTI now improved, appears to be back to baseline today,  to f/u any worsening symptoms or concerns, cont PT  Followup: Return in about 3 months (around 09/24/2022).  Cathlean Cower, MD 06/28/2022 1:27 PM [facility]

## 2022-06-25 NOTE — Telephone Encounter (Signed)
[facility] requests another UA due to patient not taking abx because he is not eating regularly. Also, please advise for verbal orders for extending PT for 2X for 2 weeks 1X for 2 weeks  1X every other week for 4 weeks

## 2022-06-25 NOTE — Telephone Encounter (Signed)
Tillie Rung  a [facility] nurse states that pt's daughter has not been giving him the antibiotics for his UTI as prescribed because the pt has not been eating regularly, and the instructions say to take the medicine with food. Tillie Rung thinks the UTI is better than it was, but felt we may need to do another urinalysis.   Tillie Rung also wanted to extend the pt's physical therapy for: 2X for 2 weeks 1X for 2 weeks  1X every other week for 4 weeks  Please call Tillie Rung to confirm: [phone]

## 2022-06-26 LAB — URINE CULTURE

## 2022-06-27 ENCOUNTER — Other Ambulatory Visit (HOSPITAL_BASED_OUTPATIENT_CLINIC_OR_DEPARTMENT_OTHER): Payer: Medicare Other

## 2022-06-28 ENCOUNTER — Encounter: Payer: Self-pay | Admitting: Internal Medicine

## 2022-06-28 DIAGNOSIS — N390 Urinary tract infection, site not specified: Secondary | ICD-10-CM | POA: Insufficient documentation

## 2022-06-28 DIAGNOSIS — R410 Disorientation, unspecified: Secondary | ICD-10-CM | POA: Insufficient documentation

## 2022-06-28 NOTE — Assessment & Plan Note (Signed)
I suspect some evidence per PT of encephalopathy related to the UTI now improved, appears to be back to baseline today,  to f/u any worsening symptoms or concerns, cont PT

## 2022-06-28 NOTE — Assessment & Plan Note (Signed)
Lab Results  Component Value Date   HGBA1C 7.0 (H) 03/10/2022   Stable, pt to continue current medical treatment  - diet, wt control, excercise

## 2022-06-28 NOTE — Assessment & Plan Note (Signed)
Abnormal culture last wk (which I cannot find scanned in to the system yet) with several resistants but ampicillin symptomatic.  OK to finish ampicillin as rx, without regard for food.  For repeat ua and culture today but already seems improved

## 2022-06-28 NOTE — Assessment & Plan Note (Signed)
BP Readings from Last 3 Encounters:  06/25/22 136/80  06/03/22 (!) 139/97  04/30/22 118/62   Stable, pt to continue medical treatment coreg 3.125 bid

## 2022-06-30 DIAGNOSIS — I5032 Chronic diastolic (congestive) heart failure: Secondary | ICD-10-CM | POA: Diagnosis not present

## 2022-06-30 DIAGNOSIS — I69322 Dysarthria following cerebral infarction: Secondary | ICD-10-CM | POA: Diagnosis not present

## 2022-06-30 DIAGNOSIS — E1122 Type 2 diabetes mellitus with diabetic chronic kidney disease: Secondary | ICD-10-CM | POA: Diagnosis not present

## 2022-06-30 DIAGNOSIS — N390 Urinary tract infection, site not specified: Secondary | ICD-10-CM | POA: Diagnosis not present

## 2022-06-30 DIAGNOSIS — E1142 Type 2 diabetes mellitus with diabetic polyneuropathy: Secondary | ICD-10-CM | POA: Diagnosis not present

## 2022-06-30 DIAGNOSIS — G8929 Other chronic pain: Secondary | ICD-10-CM | POA: Diagnosis not present

## 2022-06-30 DIAGNOSIS — C9000 Multiple myeloma not having achieved remission: Secondary | ICD-10-CM | POA: Diagnosis not present

## 2022-06-30 DIAGNOSIS — S065X0D Traumatic subdural hemorrhage without loss of consciousness, subsequent encounter: Secondary | ICD-10-CM | POA: Diagnosis not present

## 2022-06-30 DIAGNOSIS — I130 Hypertensive heart and chronic kidney disease with heart failure and stage 1 through stage 4 chronic kidney disease, or unspecified chronic kidney disease: Secondary | ICD-10-CM | POA: Diagnosis not present

## 2022-06-30 DIAGNOSIS — I495 Sick sinus syndrome: Secondary | ICD-10-CM | POA: Diagnosis not present

## 2022-06-30 DIAGNOSIS — I4821 Permanent atrial fibrillation: Secondary | ICD-10-CM | POA: Diagnosis not present

## 2022-06-30 DIAGNOSIS — K5730 Diverticulosis of large intestine without perforation or abscess without bleeding: Secondary | ICD-10-CM | POA: Diagnosis not present

## 2022-06-30 DIAGNOSIS — J449 Chronic obstructive pulmonary disease, unspecified: Secondary | ICD-10-CM | POA: Diagnosis not present

## 2022-06-30 DIAGNOSIS — I4729 Other ventricular tachycardia: Secondary | ICD-10-CM | POA: Diagnosis not present

## 2022-06-30 DIAGNOSIS — M47812 Spondylosis without myelopathy or radiculopathy, cervical region: Secondary | ICD-10-CM | POA: Diagnosis not present

## 2022-06-30 DIAGNOSIS — E1151 Type 2 diabetes mellitus with diabetic peripheral angiopathy without gangrene: Secondary | ICD-10-CM | POA: Diagnosis not present

## 2022-06-30 DIAGNOSIS — I69351 Hemiplegia and hemiparesis following cerebral infarction affecting right dominant side: Secondary | ICD-10-CM | POA: Diagnosis not present

## 2022-06-30 DIAGNOSIS — N1832 Chronic kidney disease, stage 3b: Secondary | ICD-10-CM | POA: Diagnosis not present

## 2022-06-30 DIAGNOSIS — E785 Hyperlipidemia, unspecified: Secondary | ICD-10-CM | POA: Diagnosis not present

## 2022-06-30 DIAGNOSIS — I872 Venous insufficiency (chronic) (peripheral): Secondary | ICD-10-CM | POA: Diagnosis not present

## 2022-06-30 DIAGNOSIS — Z8616 Personal history of COVID-19: Secondary | ICD-10-CM | POA: Diagnosis not present

## 2022-06-30 DIAGNOSIS — M160 Bilateral primary osteoarthritis of hip: Secondary | ICD-10-CM | POA: Diagnosis not present

## 2022-07-02 ENCOUNTER — Telehealth (HOSPITAL_BASED_OUTPATIENT_CLINIC_OR_DEPARTMENT_OTHER): Payer: Self-pay | Admitting: Physician Assistant

## 2022-07-02 NOTE — Telephone Encounter (Signed)
Left message for patient to call and discuss rescheduling the Echocardiogram ordered by Almyra Deforest, PA

## 2022-07-03 ENCOUNTER — Ambulatory Visit: Payer: Medicare Other | Admitting: Podiatry

## 2022-07-03 DIAGNOSIS — N390 Urinary tract infection, site not specified: Secondary | ICD-10-CM | POA: Diagnosis not present

## 2022-07-03 DIAGNOSIS — I69351 Hemiplegia and hemiparesis following cerebral infarction affecting right dominant side: Secondary | ICD-10-CM | POA: Diagnosis not present

## 2022-07-03 DIAGNOSIS — M47812 Spondylosis without myelopathy or radiculopathy, cervical region: Secondary | ICD-10-CM | POA: Diagnosis not present

## 2022-07-03 DIAGNOSIS — Z8616 Personal history of COVID-19: Secondary | ICD-10-CM | POA: Diagnosis not present

## 2022-07-03 DIAGNOSIS — I4729 Other ventricular tachycardia: Secondary | ICD-10-CM | POA: Diagnosis not present

## 2022-07-03 DIAGNOSIS — E1151 Type 2 diabetes mellitus with diabetic peripheral angiopathy without gangrene: Secondary | ICD-10-CM | POA: Diagnosis not present

## 2022-07-03 DIAGNOSIS — C9000 Multiple myeloma not having achieved remission: Secondary | ICD-10-CM | POA: Diagnosis not present

## 2022-07-03 DIAGNOSIS — M79674 Pain in right toe(s): Secondary | ICD-10-CM | POA: Diagnosis not present

## 2022-07-03 DIAGNOSIS — B351 Tinea unguium: Secondary | ICD-10-CM

## 2022-07-03 DIAGNOSIS — M160 Bilateral primary osteoarthritis of hip: Secondary | ICD-10-CM | POA: Diagnosis not present

## 2022-07-03 DIAGNOSIS — S065X0D Traumatic subdural hemorrhage without loss of consciousness, subsequent encounter: Secondary | ICD-10-CM | POA: Diagnosis not present

## 2022-07-03 DIAGNOSIS — E119 Type 2 diabetes mellitus without complications: Secondary | ICD-10-CM | POA: Diagnosis not present

## 2022-07-03 DIAGNOSIS — I495 Sick sinus syndrome: Secondary | ICD-10-CM | POA: Diagnosis not present

## 2022-07-03 DIAGNOSIS — E1122 Type 2 diabetes mellitus with diabetic chronic kidney disease: Secondary | ICD-10-CM | POA: Diagnosis not present

## 2022-07-03 DIAGNOSIS — N1832 Chronic kidney disease, stage 3b: Secondary | ICD-10-CM | POA: Diagnosis not present

## 2022-07-03 DIAGNOSIS — I5032 Chronic diastolic (congestive) heart failure: Secondary | ICD-10-CM | POA: Diagnosis not present

## 2022-07-03 DIAGNOSIS — M79675 Pain in left toe(s): Secondary | ICD-10-CM | POA: Diagnosis not present

## 2022-07-03 DIAGNOSIS — I69322 Dysarthria following cerebral infarction: Secondary | ICD-10-CM | POA: Diagnosis not present

## 2022-07-03 DIAGNOSIS — I130 Hypertensive heart and chronic kidney disease with heart failure and stage 1 through stage 4 chronic kidney disease, or unspecified chronic kidney disease: Secondary | ICD-10-CM | POA: Diagnosis not present

## 2022-07-03 DIAGNOSIS — E1142 Type 2 diabetes mellitus with diabetic polyneuropathy: Secondary | ICD-10-CM | POA: Diagnosis not present

## 2022-07-03 DIAGNOSIS — E785 Hyperlipidemia, unspecified: Secondary | ICD-10-CM | POA: Diagnosis not present

## 2022-07-03 DIAGNOSIS — J449 Chronic obstructive pulmonary disease, unspecified: Secondary | ICD-10-CM | POA: Diagnosis not present

## 2022-07-03 DIAGNOSIS — G8929 Other chronic pain: Secondary | ICD-10-CM | POA: Diagnosis not present

## 2022-07-03 DIAGNOSIS — I872 Venous insufficiency (chronic) (peripheral): Secondary | ICD-10-CM | POA: Diagnosis not present

## 2022-07-03 DIAGNOSIS — I4821 Permanent atrial fibrillation: Secondary | ICD-10-CM | POA: Diagnosis not present

## 2022-07-03 DIAGNOSIS — K5730 Diverticulosis of large intestine without perforation or abscess without bleeding: Secondary | ICD-10-CM | POA: Diagnosis not present

## 2022-07-04 ENCOUNTER — Emergency Department (HOSPITAL_COMMUNITY): Payer: Medicare Other

## 2022-07-04 ENCOUNTER — Encounter (HOSPITAL_COMMUNITY): Payer: Self-pay

## 2022-07-04 ENCOUNTER — Observation Stay (HOSPITAL_COMMUNITY): Payer: Medicare Other

## 2022-07-04 ENCOUNTER — Inpatient Hospital Stay (HOSPITAL_COMMUNITY)
Admission: EM | Admit: 2022-07-04 | Discharge: 2022-07-08 | DRG: 689 | Disposition: A | Discharge status: Home Health | Payer: Medicare Other | Attending: Internal Medicine | Admitting: Internal Medicine

## 2022-07-04 ENCOUNTER — Other Ambulatory Visit: Payer: Self-pay

## 2022-07-04 ENCOUNTER — Telehealth: Payer: Self-pay | Admitting: Internal Medicine

## 2022-07-04 DIAGNOSIS — E876 Hypokalemia: Secondary | ICD-10-CM | POA: Diagnosis not present

## 2022-07-04 DIAGNOSIS — J449 Chronic obstructive pulmonary disease, unspecified: Secondary | ICD-10-CM | POA: Diagnosis present

## 2022-07-04 DIAGNOSIS — I10 Essential (primary) hypertension: Secondary | ICD-10-CM | POA: Diagnosis not present

## 2022-07-04 DIAGNOSIS — F1021 Alcohol dependence, in remission: Secondary | ICD-10-CM | POA: Diagnosis present

## 2022-07-04 DIAGNOSIS — N390 Urinary tract infection, site not specified: Secondary | ICD-10-CM | POA: Diagnosis present

## 2022-07-04 DIAGNOSIS — J9601 Acute respiratory failure with hypoxia: Secondary | ICD-10-CM | POA: Diagnosis present

## 2022-07-04 DIAGNOSIS — Z86010 Personal history of colonic polyps: Secondary | ICD-10-CM

## 2022-07-04 DIAGNOSIS — G9340 Encephalopathy, unspecified: Secondary | ICD-10-CM | POA: Diagnosis not present

## 2022-07-04 DIAGNOSIS — G9341 Metabolic encephalopathy: Secondary | ICD-10-CM | POA: Diagnosis not present

## 2022-07-04 DIAGNOSIS — M47812 Spondylosis without myelopathy or radiculopathy, cervical region: Secondary | ICD-10-CM | POA: Diagnosis not present

## 2022-07-04 DIAGNOSIS — E1140 Type 2 diabetes mellitus with diabetic neuropathy, unspecified: Secondary | ICD-10-CM | POA: Diagnosis present

## 2022-07-04 DIAGNOSIS — B9620 Unspecified Escherichia coli [E. coli] as the cause of diseases classified elsewhere: Secondary | ICD-10-CM | POA: Diagnosis present

## 2022-07-04 DIAGNOSIS — Z8616 Personal history of COVID-19: Secondary | ICD-10-CM | POA: Diagnosis not present

## 2022-07-04 DIAGNOSIS — M160 Bilateral primary osteoarthritis of hip: Secondary | ICD-10-CM | POA: Diagnosis not present

## 2022-07-04 DIAGNOSIS — G250 Essential tremor: Secondary | ICD-10-CM | POA: Diagnosis present

## 2022-07-04 DIAGNOSIS — G8929 Other chronic pain: Secondary | ICD-10-CM | POA: Diagnosis present

## 2022-07-04 DIAGNOSIS — R911 Solitary pulmonary nodule: Secondary | ICD-10-CM | POA: Diagnosis present

## 2022-07-04 DIAGNOSIS — I872 Venous insufficiency (chronic) (peripheral): Secondary | ICD-10-CM | POA: Diagnosis present

## 2022-07-04 DIAGNOSIS — I739 Peripheral vascular disease, unspecified: Secondary | ICD-10-CM | POA: Diagnosis not present

## 2022-07-04 DIAGNOSIS — N3001 Acute cystitis with hematuria: Secondary | ICD-10-CM | POA: Diagnosis not present

## 2022-07-04 DIAGNOSIS — Z87820 Personal history of traumatic brain injury: Secondary | ICD-10-CM

## 2022-07-04 DIAGNOSIS — Z8249 Family history of ischemic heart disease and other diseases of the circulatory system: Secondary | ICD-10-CM

## 2022-07-04 DIAGNOSIS — R2681 Unsteadiness on feet: Secondary | ICD-10-CM | POA: Diagnosis present

## 2022-07-04 DIAGNOSIS — I2699 Other pulmonary embolism without acute cor pulmonale: Secondary | ICD-10-CM | POA: Diagnosis not present

## 2022-07-04 DIAGNOSIS — Z87891 Personal history of nicotine dependence: Secondary | ICD-10-CM

## 2022-07-04 DIAGNOSIS — R9389 Abnormal findings on diagnostic imaging of other specified body structures: Secondary | ICD-10-CM

## 2022-07-04 DIAGNOSIS — E785 Hyperlipidemia, unspecified: Secondary | ICD-10-CM | POA: Diagnosis present

## 2022-07-04 DIAGNOSIS — I493 Ventricular premature depolarization: Secondary | ICD-10-CM | POA: Diagnosis present

## 2022-07-04 DIAGNOSIS — R410 Disorientation, unspecified: Secondary | ICD-10-CM | POA: Diagnosis not present

## 2022-07-04 DIAGNOSIS — I5032 Chronic diastolic (congestive) heart failure: Secondary | ICD-10-CM | POA: Diagnosis not present

## 2022-07-04 DIAGNOSIS — I4821 Permanent atrial fibrillation: Secondary | ICD-10-CM | POA: Diagnosis present

## 2022-07-04 DIAGNOSIS — N1832 Chronic kidney disease, stage 3b: Secondary | ICD-10-CM | POA: Diagnosis present

## 2022-07-04 DIAGNOSIS — I130 Hypertensive heart and chronic kidney disease with heart failure and stage 1 through stage 4 chronic kidney disease, or unspecified chronic kidney disease: Secondary | ICD-10-CM | POA: Diagnosis not present

## 2022-07-04 DIAGNOSIS — R9431 Abnormal electrocardiogram [ECG] [EKG]: Secondary | ICD-10-CM | POA: Diagnosis not present

## 2022-07-04 DIAGNOSIS — E1122 Type 2 diabetes mellitus with diabetic chronic kidney disease: Secondary | ICD-10-CM | POA: Diagnosis present

## 2022-07-04 DIAGNOSIS — I472 Ventricular tachycardia, unspecified: Secondary | ICD-10-CM | POA: Diagnosis present

## 2022-07-04 DIAGNOSIS — Z8042 Family history of malignant neoplasm of prostate: Secondary | ICD-10-CM

## 2022-07-04 DIAGNOSIS — Z79899 Other long term (current) drug therapy: Secondary | ICD-10-CM

## 2022-07-04 DIAGNOSIS — I4891 Unspecified atrial fibrillation: Secondary | ICD-10-CM | POA: Diagnosis not present

## 2022-07-04 DIAGNOSIS — C9000 Multiple myeloma not having achieved remission: Secondary | ICD-10-CM | POA: Diagnosis not present

## 2022-07-04 DIAGNOSIS — Z6829 Body mass index (BMI) 29.0-29.9, adult: Secondary | ICD-10-CM

## 2022-07-04 DIAGNOSIS — J90 Pleural effusion, not elsewhere classified: Secondary | ICD-10-CM | POA: Diagnosis not present

## 2022-07-04 DIAGNOSIS — M545 Low back pain, unspecified: Secondary | ICD-10-CM | POA: Diagnosis present

## 2022-07-04 DIAGNOSIS — R5383 Other fatigue: Secondary | ICD-10-CM | POA: Diagnosis present

## 2022-07-04 DIAGNOSIS — Z8546 Personal history of malignant neoplasm of prostate: Secondary | ICD-10-CM

## 2022-07-04 DIAGNOSIS — I358 Other nonrheumatic aortic valve disorders: Secondary | ICD-10-CM | POA: Diagnosis present

## 2022-07-04 DIAGNOSIS — R634 Abnormal weight loss: Secondary | ICD-10-CM | POA: Diagnosis present

## 2022-07-04 DIAGNOSIS — Z881 Allergy status to other antibiotic agents status: Secondary | ICD-10-CM

## 2022-07-04 DIAGNOSIS — R609 Edema, unspecified: Secondary | ICD-10-CM | POA: Diagnosis not present

## 2022-07-04 DIAGNOSIS — Z8673 Personal history of transient ischemic attack (TIA), and cerebral infarction without residual deficits: Secondary | ICD-10-CM

## 2022-07-04 DIAGNOSIS — J439 Emphysema, unspecified: Secondary | ICD-10-CM | POA: Diagnosis not present

## 2022-07-04 DIAGNOSIS — M1990 Unspecified osteoarthritis, unspecified site: Secondary | ICD-10-CM | POA: Diagnosis present

## 2022-07-04 DIAGNOSIS — I70203 Unspecified atherosclerosis of native arteries of extremities, bilateral legs: Secondary | ICD-10-CM | POA: Diagnosis not present

## 2022-07-04 DIAGNOSIS — I69351 Hemiplegia and hemiparesis following cerebral infarction affecting right dominant side: Secondary | ICD-10-CM | POA: Diagnosis not present

## 2022-07-04 DIAGNOSIS — I495 Sick sinus syndrome: Secondary | ICD-10-CM | POA: Diagnosis not present

## 2022-07-04 DIAGNOSIS — S065X0D Traumatic subdural hemorrhage without loss of consciousness, subsequent encounter: Secondary | ICD-10-CM | POA: Diagnosis not present

## 2022-07-04 DIAGNOSIS — I5033 Acute on chronic diastolic (congestive) heart failure: Secondary | ICD-10-CM | POA: Diagnosis present

## 2022-07-04 DIAGNOSIS — E1151 Type 2 diabetes mellitus with diabetic peripheral angiopathy without gangrene: Secondary | ICD-10-CM | POA: Diagnosis not present

## 2022-07-04 DIAGNOSIS — K5730 Diverticulosis of large intestine without perforation or abscess without bleeding: Secondary | ICD-10-CM | POA: Diagnosis not present

## 2022-07-04 DIAGNOSIS — I4729 Other ventricular tachycardia: Secondary | ICD-10-CM | POA: Diagnosis not present

## 2022-07-04 DIAGNOSIS — E1142 Type 2 diabetes mellitus with diabetic polyneuropathy: Secondary | ICD-10-CM | POA: Diagnosis not present

## 2022-07-04 DIAGNOSIS — Z9079 Acquired absence of other genital organ(s): Secondary | ICD-10-CM

## 2022-07-04 DIAGNOSIS — N400 Enlarged prostate without lower urinary tract symptoms: Secondary | ICD-10-CM | POA: Diagnosis present

## 2022-07-04 DIAGNOSIS — I69322 Dysarthria following cerebral infarction: Secondary | ICD-10-CM | POA: Diagnosis not present

## 2022-07-04 DIAGNOSIS — Z833 Family history of diabetes mellitus: Secondary | ICD-10-CM

## 2022-07-04 DIAGNOSIS — R296 Repeated falls: Secondary | ICD-10-CM | POA: Diagnosis present

## 2022-07-04 LAB — COMPREHENSIVE METABOLIC PANEL
ALT: 13 U/L (ref 0–44)
AST: 26 U/L (ref 15–41)
Albumin: 2.4 g/dL — ABNORMAL LOW (ref 3.5–5.0)
Alkaline Phosphatase: 132 U/L — ABNORMAL HIGH (ref 38–126)
Anion gap: 10 (ref 5–15)
BUN: 11 mg/dL (ref 8–23)
CO2: 26 mmol/L (ref 22–32)
Calcium: 8.5 mg/dL — ABNORMAL LOW (ref 8.9–10.3)
Chloride: 104 mmol/L (ref 98–111)
Creatinine, Ser: 1.41 mg/dL — ABNORMAL HIGH (ref 0.61–1.24)
GFR, Estimated: 52 mL/min — ABNORMAL LOW (ref >60)
Glucose, Bld: 114 mg/dL — ABNORMAL HIGH (ref 70–99)
Potassium: 3.3 mmol/L — ABNORMAL LOW (ref 3.5–5.1)
Sodium: 140 mmol/L (ref 135–145)
Total Bilirubin: 0.6 mg/dL (ref 0.3–1.2)
Total Protein: 7.6 g/dL (ref 6.5–8.1)

## 2022-07-04 LAB — CBC WITH DIFFERENTIAL/PLATELET
Abs Immature Granulocytes: 0.02 10*3/uL (ref 0.00–0.07)
Basophils Absolute: 0 10*3/uL (ref 0.0–0.1)
Basophils Relative: 1 %
Eosinophils Absolute: 0 10*3/uL (ref 0.0–0.5)
Eosinophils Relative: 0 %
HCT: 45.7 % (ref 39.0–52.0)
Hemoglobin: 14.3 g/dL (ref 13.0–17.0)
Immature Granulocytes: 0 %
Lymphocytes Relative: 22 %
Lymphs Abs: 1.4 10*3/uL (ref 0.7–4.0)
MCH: 26.4 pg (ref 26.0–34.0)
MCHC: 31.3 g/dL (ref 30.0–36.0)
MCV: 84.3 fL (ref 80.0–100.0)
Monocytes Absolute: 0.4 10*3/uL (ref 0.1–1.0)
Monocytes Relative: 7 %
Neutro Abs: 4.4 10*3/uL (ref 1.7–7.7)
Neutrophils Relative %: 70 %
Platelets: 258 10*3/uL (ref 150–400)
RBC: 5.42 MIL/uL (ref 4.22–5.81)
RDW: 14.6 % (ref 11.5–15.5)
WBC: 6.3 10*3/uL (ref 4.0–10.5)
nRBC: 0 % (ref 0.0–0.2)

## 2022-07-04 LAB — MAGNESIUM: Magnesium: 1.4 mg/dL — ABNORMAL LOW (ref 1.7–2.4)

## 2022-07-04 LAB — URINALYSIS, ROUTINE W REFLEX MICROSCOPIC

## 2022-07-04 LAB — URINALYSIS, MICROSCOPIC (REFLEX)
RBC / HPF: >50 RBC/hpf (ref 0–5)
WBC, UA: >50 WBC/hpf (ref 0–5)

## 2022-07-04 LAB — LACTIC ACID, PLASMA: Lactic Acid, Venous: 2.2 mmol/L (ref 0.5–1.9)

## 2022-07-04 MED ORDER — ACETAMINOPHEN 325 MG PO TABS: 650.0000 mg | ORAL_TABLET | Freq: Four times a day (QID) | ORAL | Status: DC | PRN

## 2022-07-04 MED ORDER — CARVEDILOL 3.125 MG PO TABS
3.1250 mg | ORAL_TABLET | Freq: Two times a day (BID) | ORAL | Status: DC
  Administered 2022-07-05 – 2022-07-06 (×4): 3.125 mg via ORAL
  Filled 2022-07-04 (×4): qty 1

## 2022-07-04 MED ORDER — SODIUM CHLORIDE 0.9 % IV SOLN
2.0000 g | INTRAVENOUS | Status: DC
  Administered 2022-07-05 – 2022-07-07 (×3): 2 g via INTRAVENOUS
  Filled 2022-07-04 (×3): qty 20

## 2022-07-04 MED ORDER — IOHEXOL 350 MG/ML SOLN
50.0000 mL | Freq: Once | INTRAVENOUS | Status: AC | PRN
  Administered 2022-07-04: 50 mL via INTRAVENOUS

## 2022-07-04 MED ORDER — POTASSIUM CHLORIDE CRYS ER 20 MEQ PO TBCR
40.0000 meq | EXTENDED_RELEASE_TABLET | Freq: Once | ORAL | Status: AC
  Administered 2022-07-04: 40 meq via ORAL
  Filled 2022-07-04: qty 2

## 2022-07-04 MED ORDER — ALBUTEROL SULFATE (2.5 MG/3ML) 0.083% IN NEBU: 2.5000 mg | INHALATION_SOLUTION | Freq: Four times a day (QID) | RESPIRATORY_TRACT | Status: DC | PRN

## 2022-07-04 MED ORDER — ROSUVASTATIN CALCIUM 20 MG PO TABS
20.0000 mg | ORAL_TABLET | Freq: Every evening | ORAL | Status: DC
  Administered 2022-07-04 – 2022-07-05 (×2): 20 mg via ORAL
  Filled 2022-07-04 (×2): qty 1

## 2022-07-04 MED ORDER — ASPIRIN 81 MG PO TBEC
81.0000 mg | DELAYED_RELEASE_TABLET | Freq: Every day | ORAL | Status: DC
  Filled 2022-07-04: qty 1

## 2022-07-04 MED ORDER — SODIUM CHLORIDE 0.9 % IV SOLN
2.0000 g | Freq: Once | INTRAVENOUS | Status: AC
  Administered 2022-07-04: 2 g via INTRAVENOUS
  Filled 2022-07-04: qty 20

## 2022-07-04 MED ORDER — CEPHALEXIN 500 MG PO CAPS: 500.0000 mg | ORAL_CAPSULE | Freq: Three times a day (TID) | ORAL | 0 refills | Status: DC

## 2022-07-04 MED ORDER — ACETAMINOPHEN 650 MG RE SUPP: 650.0000 mg | Freq: Four times a day (QID) | RECTAL | Status: DC | PRN

## 2022-07-04 MED ORDER — FUROSEMIDE 10 MG/ML IJ SOLN
40.0000 mg | Freq: Every day | INTRAMUSCULAR | Status: DC
  Administered 2022-07-04: 40 mg via INTRAVENOUS
  Filled 2022-07-04: qty 4

## 2022-07-04 MED ORDER — ONDANSETRON HCL 4 MG PO TABS: 4.0000 mg | ORAL_TABLET | Freq: Four times a day (QID) | ORAL | Status: DC | PRN

## 2022-07-04 MED ORDER — SODIUM CHLORIDE 0.9 % IV BOLUS
500.0000 mL | Freq: Once | INTRAVENOUS | Status: AC
  Administered 2022-07-04: 500 mL via INTRAVENOUS

## 2022-07-04 MED ORDER — ONDANSETRON HCL 4 MG/2ML IJ SOLN: 4.0000 mg | Freq: Four times a day (QID) | INTRAMUSCULAR | Status: DC | PRN

## 2022-07-04 MED ORDER — TAMSULOSIN HCL 0.4 MG PO CAPS
0.4000 mg | ORAL_CAPSULE | Freq: Every day | ORAL | Status: DC
  Administered 2022-07-04 – 2022-07-07 (×4): 0.4 mg via ORAL
  Filled 2022-07-04 (×4): qty 1

## 2022-07-04 MED ORDER — POTASSIUM CHLORIDE CRYS ER 20 MEQ PO TBCR: 20.0000 meq | EXTENDED_RELEASE_TABLET | Freq: Once | ORAL | Status: DC

## 2022-07-04 MED ORDER — HEPARIN SODIUM (PORCINE) 5000 UNIT/ML IJ SOLN: 5000.0000 [IU] | Freq: Two times a day (BID) | INTRAMUSCULAR | Status: DC

## 2022-07-04 NOTE — Telephone Encounter (Signed)
Please advise as patient has UTI and blood in urine and in underwear. No mention of pain but patient is having some confusion. I looked but there is no availability for the remaining part of the day

## 2022-07-04 NOTE — Telephone Encounter (Signed)
An Therapist, music with [facility] called and said that she is visiting the patient and that he has a UTI and that he has blood in his urine and it is bright red, also blood in his underwear. She didn't mention any pain but did say that he has some confusion. Because of the blood and how much she was describing I did transfer to the triage nurse for further eval

## 2022-07-04 NOTE — Telephone Encounter (Signed)
[place] for cephalexin course - I sent the antibiotic erx

## 2022-07-04 NOTE — ED Notes (Signed)
Dtr asked for update; states doctor thinks he may have uti and need admission . RN encouraged her to work with him to drink PO fluids in order to get urine sample. She will work towards that.

## 2022-07-04 NOTE — ED Provider Notes (Signed)
[facility] EMERGENCY DEPARTMENT Provider Note   CSN: [id number] Arrival date & time: 07/04/22  1055     History  Chief Complaint  Patient presents with   Altered Mental Status    [name] is a 76 y.o. male.  Pt is a 76 yo male with a pmhx significant for afib (not on thinners due to fall with SDH), htn, sick sinus syndrome, hld, copd, bph, prostate cancer, ED, kidney stones, arthritis, dm, ckd, and cva.  Pt's daughter reports that pt has been more weak and confused for the past 2 weeks.  He has been working with PT who have told her that he's much more unsteady with his walker than usual.  Pt has not been eating or drinking much.  Daughter said he's been more agitated than usual.  She said he had a uti for which he was given ampicillin.  Apparently a culture grew out e.coli, however, that culture is not in the computer.  He saw his pcp on the 6th which showed a negative culture, but he was told to finish the abx.  Pt's daughter said he is not taking it because it is supposed to be taken with food.  Today, she noticed some blood in his urine when he urinated today.         Home Medications Prior to Admission medications   Medication Sig Start Date End Date Taking? Authorizing Provider  carvedilol (COREG) 3.125 MG tablet Take 1 tablet (3.125 mg total) by mouth 2 (two) times daily with a meal. 06/03/22  Yes Leonie Man, MD  PROAIR HFA 108 [phone] Base) MCG/ACT inhaler INHALE 2 PUFFS INTO THE  LUNGS EVERY 6 HOURS AS  NEEDED Patient taking differently: Inhale 2 puffs into the lungs every 6 (six) hours as needed for shortness of breath or wheezing. 06/01/20  Yes Biagio Borg, MD  rosuvastatin (CRESTOR) 20 MG tablet Take 20 mg by mouth every evening.   Yes [provider]  acetaminophen (TYLENOL) 500 MG tablet Take 1,000 mg by mouth every 8 (eight) hours as needed for headache.    [provider]  acetaminophen (TYLENOL) 650 MG CR tablet Take  1,300 mg by mouth every 8 (eight) hours as needed for pain.    [provider]  ampicillin (PRINCIPEN) 500 MG capsule Take 1 capsule (500 mg total) by mouth 4 (four) times daily. 06/16/22   Biagio Borg, MD  Blood Glucose Monitoring Suppl (ACCU-CHEK GUIDE ME) w/Device KIT Use as directed twice per day E11.9 04/02/22   Biagio Borg, MD  cephALEXin (KEFLEX) 500 MG capsule Take 1 capsule (500 mg total) by mouth 3 (three) times daily. 07/04/22   Biagio Borg, MD  furosemide (LASIX) 40 MG tablet TAKE 1 TABLET BY MOUTH DAILY .  MAY TAKE AN ADDITIONAL 1 TABLET  BY MOUTH IF NEEDED FOR SWELLING 03/10/22   Biagio Borg, MD  glucose blood (ACCU-CHEK GUIDE) test strip Use as instructed twice per day E11.9 04/02/22   Biagio Borg, MD  [employer] ([place] LANCING) [place]  09/19/20   [provider]  Lancets (ONETOUCH DELICA PLUS NOMVEH20N) [place] 10/29/20   Biagio Borg, MD  lidocaine (LIDODERM) 5 % Place 1 patch onto the skin daily. Remove & Discard patch within 12 hours or as directed by MD 04/20/22   Palumbo, April, MD  ondansetron (ZOFRAN-ODT) 4 MG disintegrating tablet Take 1 tablet (4 mg total) by mouth every  8 (eight) hours as needed for nausea or vomiting. 04/30/22   Biagio Borg, MD  potassium chloride (KLOR-CON) 10 MEQ tablet Take 1 tablet (10 mEq total) by mouth daily. 06/03/22   Leonie Man, MD  prochlorperazine (COMPAZINE) 10 MG tablet Take 1 tablet (10 mg total) by mouth every 6 (six) hours as needed for nausea or vomiting (or headache). 2/84/13   Delora Fuel, MD      Allergies    Ciprofloxacin    Review of Systems   Review of Systems  Genitourinary:  Positive for hematuria.  Neurological:  Positive for weakness.       Confusion  All other systems reviewed and are negative.   Physical Exam Updated Vital Signs BP (!) 156/95   Pulse 72   Temp 97.8 F (36.6 C)   Resp 18   SpO2 96%  Physical Exam Vitals and nursing note reviewed.   Constitutional:      Appearance: Normal appearance. He is obese.  HENT:     Head: Normocephalic and atraumatic.     Right Ear: External ear normal.     Left Ear: External ear normal.     Nose: Nose normal.     Mouth/Throat:     Mouth: Mucous membranes are dry.  Eyes:     Extraocular Movements: Extraocular movements intact.     Conjunctiva/sclera: Conjunctivae normal.     Pupils: Pupils are equal, round, and reactive to light.  Cardiovascular:     Rate and Rhythm: Normal rate. Rhythm irregular.     Pulses: Normal pulses.     Heart sounds: Normal heart sounds.  Pulmonary:     Effort: Pulmonary effort is normal.     Breath sounds: Normal breath sounds.  Abdominal:     General: Abdomen is flat. Bowel sounds are normal.     Palpations: Abdomen is soft.  Musculoskeletal:     Cervical back: Normal range of motion and neck supple.     Right lower leg: Edema present.     Left lower leg: Edema present.  Skin:    General: Skin is warm.     Capillary Refill: Capillary refill takes less than 2 seconds.  Neurological:     Mental Status: He is alert and oriented to person, place, and time.     Comments: Weakness BLE     ED Results / Procedures / Treatments   Labs (all labs ordered are listed, but only abnormal results are displayed) Labs Reviewed  URINALYSIS, ROUTINE W REFLEX MICROSCOPIC - Abnormal; Notable for the following components:      Result Value   Color, Urine RED (*)    APPearance TURBID (*)    Glucose, UA   (*)    Value: TEST NOT REPORTED DUE TO COLOR INTERFERENCE OF URINE PIGMENT   Hgb urine dipstick   (*)    Value: TEST NOT REPORTED DUE TO COLOR INTERFERENCE OF URINE PIGMENT   Bilirubin Urine   (*)    Value: TEST NOT REPORTED DUE TO COLOR INTERFERENCE OF URINE PIGMENT   Ketones, ur   (*)    Value: TEST NOT REPORTED DUE TO COLOR INTERFERENCE OF URINE PIGMENT   Protein, ur   (*)    Value: TEST NOT REPORTED DUE TO COLOR INTERFERENCE OF URINE PIGMENT   Nitrite   (*)     Value: TEST NOT REPORTED DUE TO COLOR INTERFERENCE OF URINE PIGMENT   Leukocytes,Ua   (*)    Value: TEST NOT REPORTED  DUE TO COLOR INTERFERENCE OF URINE PIGMENT   All other components within normal limits  COMPREHENSIVE METABOLIC PANEL - Abnormal; Notable for the following components:   Potassium 3.3 (*)    Glucose, Bld 114 (*)    Creatinine, Ser 1.41 (*)    Calcium 8.5 (*)    Albumin 2.4 (*)    Alkaline Phosphatase 132 (*)    GFR, Estimated 52 (*)    All other components within normal limits  URINALYSIS, MICROSCOPIC (REFLEX) - Abnormal; Notable for the following components:   Bacteria, UA FEW (*)    All other components within normal limits  URINE CULTURE  CULTURE, BLOOD (ROUTINE X 2)  CULTURE, BLOOD (ROUTINE X 2)  CBC WITH DIFFERENTIAL/PLATELET  LACTIC ACID, PLASMA  LACTIC ACID, PLASMA  MAGNESIUM    EKG None  Radiology DG Chest Portable 1 View  Result Date: 07/04/2022 CLINICAL DATA:  Altered mental status EXAM: PORTABLE CHEST 1 VIEW COMPARISON:  Chest x-ray 04/30/2022.  Chest CT 01/10/2011 FINDINGS: There are 2 oval densities in the right mid lung with the largest measuring 3.8 x 1.9 cm, indeterminate. The lungs are otherwise clear. Costophrenic angles are clear. There is no pneumothorax. Cardiomediastinal silhouette is within normal limits. There is mild elevation of the left hemidiaphragm. No acute fractures are seen. IMPRESSION: There are 2 oval densities in the right mid lung with the largest measuring 3.8 x 1.9 cm, indeterminate. CT chest is recommended for further evaluation. Electronically Signed   By: Ronney Asters M.D.   On: 07/04/2022 18:19    Procedures Procedures    Medications Ordered in ED Medications  cefTRIAXone (ROCEPHIN) 2 g in sodium chloride 0.9 % 100 mL IVPB (2 g Intravenous New Bag/Given 07/04/22 1803)  potassium chloride SA (KLOR-CON M) CR tablet 20 mEq (has no administration in time range)  sodium chloride 0.9 % bolus 500 mL (500 mLs Intravenous  New Bag/Given 07/04/22 1800)    ED Course/ Medical Decision Making/ A&P                           Medical Decision Making Amount and/or Complexity of Data Reviewed Labs: ordered. Radiology: ordered.  Risk Prescription drug management. Decision regarding hospitalization.   This patient presents to the ED for concern of ams, this involves an extensive number of treatment options, and is a complaint that carries with it a high risk of complications and morbidity.  The differential diagnosis includes infection, electrolyte abn   Co morbidities that complicate the patient evaluation  afib (not on thinners due to fall with SDH), htn, sick sinus syndrome, hld, copd, bph, prostate cancer, ED, kidney stones, arthritis, dm, ckd, and cva   Additional history obtained:  Additional history obtained from epic chart review External records from outside source obtained and reviewed including daughter   Lab Tests:  I Ordered, and personally interpreted labs.  The pertinent results include:  cbc nl, cmp with k low at 3.3; cr elevated at 1.41 (chronic); ua with hematuria + >50 wbcs and rbcs   Imaging Studies ordered:  I ordered imaging studies including cxr  I independently visualized and interpreted imaging which showed  There are 2 oval densities in the right mid lung with the largest  measuring 3.8 x 1.9 cm, indeterminate. CT chest is recommended for  further evaluation.   I agree with the radiologist interpretation   Cardiac Monitoring:  The patient was maintained on a cardiac monitor.  I personally viewed  and interpreted the cardiac monitored which showed an underlying rhythm of: afib   Medicines ordered and prescription drug management:  I ordered medication including rocephin  for uti  Reevaluation of the patient after these medicines showed that the patient improved I have reviewed the patients home medicines and have made adjustments as needed  Critical  Interventions:  abx   Consultations Obtained:  I requested consultation with the hospitalist (Dr. Roosevelt Locks),  and discussed lab and imaging findings as well as pertinent plan - he will admit  Problem List / ED Course:  UTI with metabolic encephalopathy:  pt given iv abx and iv fluids.  Pt d/w Dr. Roosevelt Locks for admission.  Urine sent for culture.   Reevaluation:  After the interventions noted above, I reevaluated the patient and found that they have :improved   Social Determinants of Health:  Lives at home   Dispostion:  After consideration of the diagnostic results and the patients response to treatment, I feel that the patent would benefit from admission.          Final Clinical Impression(s) / ED Diagnoses Final diagnoses:  Acute metabolic encephalopathy  Acute cystitis with hematuria  Abnormal chest x-ray    Rx / DC Orders ED Discharge Orders     None         Isla Pence, MD 07/04/22 1824

## 2022-07-04 NOTE — H&P (Addendum)
History and Physical    [name] QIW:[id number] DOB: 1945/10/30 DOA: 07/04/2022  PCP: Biagio Borg, MD (Confirm with patient/family/NH records and if not entered, this has to be entered at [facility] point of entry) Patient coming from: Home  I have personally briefly reviewed patient's old medical records in [place]  Chief Complaint: Blood in urine  HPI: [name] is a 76 y.o. male with medical history significant of PAF not on anticoagulation secondary to frequent falls, subdural hematoma 2/2 fall, chronic ambulation dysfunction on roller walker, COPD, prostate cancer status post prostatectomy, BPH, COPD, chronic HFpEF, CKD stage II, CVA, brought in by family member for evaluation of urinary symptoms and confusion.  Daughter at bedside reporting patient developed generalized weakness and episode of confusion and agitation about 2 weeks ago, at the same time, family also noticed patient became even more unsteady and had 1 episode of 4 on his back last week.  No fever or chills.  On December 6, patient was seen by PCP who diagnosed patient with E. coli UTI and started him on ampicillin.  However due to the frequent episode of confusion and significant decrease of oral intake this week, patient has not started to take ABX. And yesterday, patient started to notice blood in the urine with significant burning sensation.  Patient had prostate cancer status post prostatectomy more than 10 years ago.  He admitted that he had 1 episode of UTI this year before today and he has had increasing nocturnal urination and weak stream sometimes intermittent stream of urination.  ED Course: Patient was found febrile, blood pressure elevated nonhypoxic.  UA showed WBC> 50, RBC> 50.  Chest x-ray showed incidental finding of lung nodules on the right side to oval density with a larger 1 measuring 3.8 x 1.9 cm, which is new compared to chest x-ray done 2 months ago.  CT chest ordered.  Patient  however denies any chest pain shortness of breath, but had a >20 lbs weight loss since July.  Review of Systems: As per HPI otherwise 14 point review of systems negative.    Past Medical History:  Diagnosis Date   Adenoma 05/2008   Alcoholism in recovery [facility])    BPH (benign prostatic hyperplasia)    Cancer (HCC)    Prostate   Chronic LBP    Hip & Back -- Sees Dr. Maia Petties @ [place]   CKD (chronic kidney disease)    with AKI requiring brief CRRT 02/2020   Colon polyps    Complex renal cyst 12/2010   Controlled type 2 diabetes mellitus with neuropathy ([place]) 01/2007   COPD (chronic obstructive pulmonary disease) ([place])    Diabetes ([place])    Diverticulitis of colon    ED (erectile dysfunction)    s/p Penile prosthesis (09/2010)   History of prostate cancer    Dr. Karsten Ro   History of sick sinus syndrome    reduced BB dose for Bradycardia   HLD (hyperlipidemia)    Hypertension    Impaired glucose tolerance 12/21/2013   Nephrolithiasis    Osteoarthritis of both hips    Osteoarthritis of lumbosacral spine    with Disc Disease   PAF (paroxysmal atrial fibrillation) (HCC)    No longer on Amiodarone.  Not on Anticoaguation b/c no recurrence.   Paresthesias/numbness    Bilateral LE   Peripheral neuropathy 12/21/2013   Stroke [facility])    left occipital CVA ~ 2018; possible TIA 01/24/22   Subdural hematoma (HCC)  04/04/2022   Anticoagulation stopped   Venous insufficiency     Past Surgical History:  Procedure Laterality Date   IR ANGIO INTRA EXTRACRAN SEL COM CAROTID INNOMINATE UNI R MOD SED  04/17/2022   LEFT HEART CATH AND CORONARY ANGIOGRAPHY  2003   Normal Coronary Arteries.   NM MYOVIEW LTD  02/21/2020   EF 60%.  Medium sized mild severity defect in the basal inferior, mid inferior and apical inferior location-suggesting of ischemia.  Read as low-intermediate risk.  (On cardiology review this is felt to be a fixed defect either related to prior infarct versus  diaphragmatic attenuation.  Felt to be low risk)   PENILE PROSTHESIS IMPLANT  06/21/2012   Procedure: PENILE PROTHESIS INFLATABLE;  Surgeon: Claybon Jabs, MD;  Location: [facility];  Service: Urology;  Laterality: N/A;  REMOVAL AND REPLACEMENT OF SOME  OF PROSTHESIS (AMS)    PENILE PROSTHESIS PLACEMENT  09/2010   PROSTATECTOMY     RADIOLOGY WITH ANESTHESIA N/A 04/17/2022   Procedure: Right middle meningeal artery embolization;  Surgeon: Consuella Lose, MD;  Location: [place];  Service: Radiology;  Laterality: N/A;   REMOVAL OF PENILE PROSTHESIS N/A 02/15/2018   Procedure: REMOVAL OF PENILE PROSTHESIS;  Surgeon: Kathie Rhodes, MD;  Location: [facility];  Service: Urology;  Laterality: N/A;   TRANSTHORACIC ECHOCARDIOGRAM  01/2022   EF 70-75%:Marland Kitchen  Hyperdynamic LV with no RWMA.  Mild LVH.  Severely dilated left atrium suggesting notable diastolic dysfunction, but unable to interpret.  Mildly enlarged RV with normal function.  Normal PAP despite moderate dilated RA and increased RAP/CVP.Marland Kitchen  No MS or MR.  No AAS or AI.  Aortic root measuring 42 mm with ascending aorta measuring 41 mm.   TRANSTHORACIC ECHOCARDIOGRAM  02/17/2020   EF 60 to 65%.  No R WMA.  Unable to assess diastolic parameters because of A. fib.  Mildly elevated PA pressures.  Mild LA dilation.  Mild aortic valve sclerosis but no stenosis.  Normal IVC.     reports that he quit smoking about 39 years ago. His smoking use included cigarettes. He has never used smokeless tobacco. He reports that he does not currently use alcohol. He reports that he does not use drugs.  Allergies  Allergen Reactions   Ciprofloxacin Other (See Comments)    All over weakness    Family History  Problem Relation Age of Onset   Coronary artery disease Mother    Heart attack Mother    Coronary artery disease Father    Prostate cancer Father    Diabetes Father      Prior to Admission medications   Medication Sig Start Date End Date  Taking? Authorizing Provider  ampicillin (PRINCIPEN) 500 MG capsule Take 1 capsule (500 mg total) by mouth 4 (four) times daily. 06/16/22  Yes Biagio Borg, MD  carvedilol (COREG) 3.125 MG tablet Take 1 tablet (3.125 mg total) by mouth 2 (two) times daily with a meal. 06/03/22  Yes Leonie Man, MD  furosemide (LASIX) 40 MG tablet TAKE 1 TABLET BY MOUTH DAILY .  MAY TAKE AN ADDITIONAL 1 TABLET  BY MOUTH IF NEEDED FOR SWELLING 03/10/22  Yes Biagio Borg, MD  potassium chloride (KLOR-CON) 10 MEQ tablet Take 1 tablet (10 mEq total) by mouth daily. 06/03/22  Yes Leonie Man, MD  PROAIR HFA 108 [phone] Base) MCG/ACT inhaler INHALE 2 PUFFS INTO THE  LUNGS EVERY 6 HOURS AS  NEEDED Patient taking differently: Inhale 2 puffs  into the lungs every 6 (six) hours as needed for shortness of breath or wheezing. 06/01/20  Yes Biagio Borg, MD  rosuvastatin (CRESTOR) 20 MG tablet Take 20 mg by mouth every evening.   Yes [provider]  Blood Glucose Monitoring Suppl (ACCU-CHEK GUIDE ME) w/Device KIT Use as directed twice per day E11.9 04/02/22   Biagio Borg, MD  cephALEXin (KEFLEX) 500 MG capsule Take 1 capsule (500 mg total) by mouth 3 (three) times daily. 07/04/22   Biagio Borg, MD  glucose blood (ACCU-CHEK GUIDE) test strip Use as instructed twice per day E11.9 04/02/22   Biagio Borg, MD  Lancet Devices ([place] LANCING) [place]  09/19/20   [provider]  Lancets (ONETOUCH DELICA PLUS [id number]) [place] 10/29/20   Biagio Borg, MD  lidocaine (LIDODERM) 5 % Place 1 patch onto the skin daily. Remove & Discard patch within 12 hours or as directed by MD Patient not taking: Reported on 07/04/2022 04/20/22   Palumbo, April, MD  ondansetron (ZOFRAN-ODT) 4 MG disintegrating tablet Take 1 tablet (4 mg total) by mouth every 8 (eight) hours as needed for nausea or vomiting. Patient not taking: Reported on 07/04/2022 04/30/22   Biagio Borg, MD  prochlorperazine (COMPAZINE)  10 MG tablet Take 1 tablet (10 mg total) by mouth every 6 (six) hours as needed for nausea or vomiting (or headache). Patient not taking: Reported on 83/33/8329 1/91/66   Delora Fuel, MD    Physical Exam: Vitals:   07/04/22 1125 07/04/22 1516 07/04/22 1730  BP: (!) 144/109 (!) 157/97 (!) 156/95  Pulse: 72 77 72  Resp: _0 Temp: 98 F (36.7 C) 97.8 F (36.6 C)   TempSrc: Oral    SpO2: 100% (!) 86% 96%    Constitutional: NAD, calm, comfortable Vitals:   07/04/22 1125 07/04/22 1516 07/04/22 1730  BP: (!) 144/109 (!) 157/97 (!) 156/95  Pulse: 72 77 72  Resp: _1 Temp: 98 F (36.7 C) 97.8 F (36.6 C)   TempSrc: Oral    SpO2: 100% (!) 86% 96%   Eyes: PERRL, lids and conjunctivae normal ENMT: Mucous membranes are moist. Posterior pharynx clear of any exudate or lesions.Normal dentition.  Neck: normal, supple, no masses, no thyromegaly Respiratory: clear to auscultation bilaterally, no wheezing, no crackles. Normal respiratory effort. No accessory muscle use.  Cardiovascular: Regular rate and rhythm, no murmurs / rubs / gallops. 2+ extremity edema. 2+ pedal pulses. No carotid bruits.  Abdomen: no tenderness, no masses palpated. No hepatosplenomegaly. Bowel sounds positive.  Musculoskeletal: no clubbing / cyanosis. No joint deformity upper and lower extremities. Good ROM, no contractures. Normal muscle tone.  Skin: no rashes, lesions, ulcers. No induration Neurologic: CN 2-12 grossly intact. Sensation intact, DTR normal. Strength 5/5 in all 4.  Psychiatric: Normal judgment and insight. Alert and oriented x 3. Normal mood.     Labs on Admission: I have personally reviewed following labs and imaging studies  CBC: Recent Labs  Lab 07/04/22 1149  WBC 6.3  NEUTROABS 4.4  HGB 14.3  HCT 45.7  MCV 84.3  PLT 060   Basic Metabolic Panel: Recent Labs  Lab 07/04/22 1149  NA 140  K 3.3*  CL 104  CO2 26  GLUCOSE 114*  BUN 11  CREATININE 1.41*  CALCIUM 8.5*    GFR: Estimated Creatinine Clearance: 56.9 mL/min (A) (by C-G formula based on SCr of 1.41 mg/dL (H)). Liver Function Tests:  Recent Labs  Lab 07/04/22 1149  AST 26  ALT 13  ALKPHOS 132*  BILITOT 0.6  PROT 7.6  ALBUMIN 2.4*   No results for input(s): "LIPASE", "AMYLASE" in the last 168 hours. No results for input(s): "AMMONIA" in the last 168 hours. Coagulation Profile: No results for input(s): "INR", "PROTIME" in the last 168 hours. Cardiac Enzymes: No results for input(s): "CKTOTAL", "CKMB", "CKMBINDEX", "TROPONINI" in the last 168 hours. BNP (last 3 results) No results for input(s): "PROBNP" in the last 8760 hours. HbA1C: No results for input(s): "HGBA1C" in the last 72 hours. CBG: No results for input(s): "GLUCAP" in the last 168 hours. Lipid Profile: No results for input(s): "CHOL", "HDL", "LDLCALC", "TRIG", "CHOLHDL", "LDLDIRECT" in the last 72 hours. Thyroid Function Tests: No results for input(s): "TSH", "T4TOTAL", "FREET4", "T3FREE", "THYROIDAB" in the last 72 hours. Anemia Panel: No results for input(s): "VITAMINB12", "FOLATE", "FERRITIN", "TIBC", "IRON", "RETICCTPCT" in the last 72 hours. Urine analysis:    Component Value Date/Time   COLORURINE RED (A) 07/04/2022 1411   APPEARANCEUR TURBID (A) 07/04/2022 1411   LABSPEC  07/04/2022 1411    TEST NOT REPORTED DUE TO COLOR INTERFERENCE OF URINE PIGMENT   PHURINE  07/04/2022 1411    TEST NOT REPORTED DUE TO COLOR INTERFERENCE OF URINE PIGMENT   GLUCOSEU (A) 07/04/2022 1411    TEST NOT REPORTED DUE TO COLOR INTERFERENCE OF URINE PIGMENT   GLUCOSEU NEGATIVE 06/25/2022 1432   HGBUR (A) 07/04/2022 1411    TEST NOT REPORTED DUE TO COLOR INTERFERENCE OF URINE PIGMENT   BILIRUBINUR (A) 07/04/2022 1411    TEST NOT REPORTED DUE TO COLOR INTERFERENCE OF URINE PIGMENT   KETONESUR (A) 07/04/2022 1411    TEST NOT REPORTED DUE TO COLOR INTERFERENCE OF URINE PIGMENT   PROTEINUR (A) 07/04/2022 1411    TEST NOT REPORTED DUE  TO COLOR INTERFERENCE OF URINE PIGMENT   UROBILINOGEN 0.2 06/25/2022 1432   NITRITE (A) 07/04/2022 1411    TEST NOT REPORTED DUE TO COLOR INTERFERENCE OF URINE PIGMENT   LEUKOCYTESUR (A) 07/04/2022 1411    TEST NOT REPORTED DUE TO COLOR INTERFERENCE OF URINE PIGMENT    Radiological Exams on Admission: DG Chest Portable 1 View  Result Date: 07/04/2022 CLINICAL DATA:  Altered mental status EXAM: PORTABLE CHEST 1 VIEW COMPARISON:  Chest x-ray 04/30/2022.  Chest CT 01/10/2011 FINDINGS: There are 2 oval densities in the right mid lung with the largest measuring 3.8 x 1.9 cm, indeterminate. The lungs are otherwise clear. Costophrenic angles are clear. There is no pneumothorax. Cardiomediastinal silhouette is within normal limits. There is mild elevation of the left hemidiaphragm. No acute fractures are seen. IMPRESSION: There are 2 oval densities in the right mid lung with the largest measuring 3.8 x 1.9 cm, indeterminate. CT chest is recommended for further evaluation. Electronically Signed   By: Ronney Asters M.D.   On: 07/04/2022 18:19    EKG: Independently reviewed.  A-fib, rate controlled.  Assessment/Plan Principal Problem:   UTI (urinary tract infection) Active Problems:   COPD (chronic obstructive pulmonary disease) (HCC)   Benign essential tremor   Chronic kidney disease, stage 3b (HCC)   Acute lower UTI  (please populate well all problems here in Problem List. (For example, if patient is on BP meds at home and you resume or decide to hold them, it is a problem that needs to be her. Same for CAD, COPD, HLD and so on)  Acute metabolic encephalopathy -Likely secondary to UTI with acute  hematuria -Treat UTI then reevaluate. -Has a recent subdural hematoma secondary to fall 2 months ago.  Since then, family has installed a in the house surveillance system with 24/7 B2 surveillance.  The only fall patient sustained was last week when the patient for on his back, but no head injury or loss  of consciousness ever noticed.  And patient not on thinners and neuroexam nonfocal, hold off head imaging -Check PVR  Acute on chronic HFpEF decompensation -Significant peripheral edema, past echo consistently showed increased LVEF with diastolic function indeterminate secondary to chronic A-fib, but clinically suspect patient has diastolic dysfunction as there is a LVH on his EKGs. -Start IV Lasix 40 mg twice daily -Check DVT study  Incidental lung nodules -Right midlung nodules, appears to be new compared to chest x-ray done 2 months ago. -Given recent weight loss suspicious for malignancy -CT chest ordered in the ED.  COPD -Stable, no symptoms or signs of acute exacerbation.  BPH -Check PVR -Start Flomax -Outpatient follow-up with urology for urodynamic study  Recent SDH -No head injury in recent weeks and hold off brain imaging  Chronic A-fib -Rate controlled, continue metoprolol -Add aspirin  DVT prophylaxis: Lovenox Code Status: Full code Family Communication: Daughter at bedside Disposition Plan: Expect less than 2 midnight hospital stay Consults called: None Admission status: Tele obs   Lequita Halt MD [facility] Pager [phone]  07/04/2022, 7:42 PM

## 2022-07-04 NOTE — ED Provider Triage Note (Signed)
Emergency Medicine Provider Triage Evaluation Note  [name] , a 76 y.o. male  was evaluated in triage.  Pt complains of recurrent UTIs, was supposed to take amoxicillin for UTI w/food 4x/day per daughter, but has been unable to 2/2 to poor appetite (per daughter new poor appetite). Daughter states blood in urine today.  Review of Systems  Positive: Hematuria,confusion Negative: Back pain, abd pain  Physical Exam  BP (!) 144/109 (BP Location: Right Arm)   Pulse 72   Temp 98 F (36.7 C) (Oral)   Resp 20   SpO2 100%  Gen:   Awake, no distress   Resp:  Normal effort  MSK:   Moves extremities without difficulty   Medical Decision Making  Medically screening exam initiated at 11:29 AM.  Appropriate orders placed.  Shahin Meggison was informed that the remainder of the evaluation will be completed by another provider, this initial triage assessment does not replace that evaluation, and the importance of remaining in the ED until their evaluation is complete    Osvaldo Shipper, [place] 07/04/22 1132

## 2022-07-04 NOTE — ED Triage Notes (Signed)
Pt arrived POV with family stating he is having confusion and hematuria. Pt is having recurrent UTIs. Pt has had a decreased appetite.

## 2022-07-04 NOTE — Telephone Encounter (Signed)
Spoke with patients daughter about abx sent to pharmacy. Patient is currently at ED, and will call to schedule a follow up appt.

## 2022-07-04 NOTE — ED Notes (Signed)
Daughter [name], requesting to be updated on care.

## 2022-07-05 ENCOUNTER — Inpatient Hospital Stay (HOSPITAL_COMMUNITY): Payer: Medicare Other

## 2022-07-05 ENCOUNTER — Observation Stay (HOSPITAL_COMMUNITY): Payer: Medicare Other

## 2022-07-05 ENCOUNTER — Other Ambulatory Visit (HOSPITAL_COMMUNITY): Payer: Medicare Other

## 2022-07-05 DIAGNOSIS — G9341 Metabolic encephalopathy: Secondary | ICD-10-CM

## 2022-07-05 DIAGNOSIS — N3001 Acute cystitis with hematuria: Secondary | ICD-10-CM | POA: Diagnosis not present

## 2022-07-05 DIAGNOSIS — R634 Abnormal weight loss: Secondary | ICD-10-CM | POA: Diagnosis present

## 2022-07-05 DIAGNOSIS — B9620 Unspecified Escherichia coli [E. coli] as the cause of diseases classified elsewhere: Secondary | ICD-10-CM | POA: Diagnosis not present

## 2022-07-05 DIAGNOSIS — I5033 Acute on chronic diastolic (congestive) heart failure: Secondary | ICD-10-CM | POA: Diagnosis not present

## 2022-07-05 DIAGNOSIS — J9601 Acute respiratory failure with hypoxia: Secondary | ICD-10-CM | POA: Diagnosis not present

## 2022-07-05 DIAGNOSIS — I739 Peripheral vascular disease, unspecified: Secondary | ICD-10-CM | POA: Diagnosis not present

## 2022-07-05 DIAGNOSIS — N1832 Chronic kidney disease, stage 3b: Secondary | ICD-10-CM | POA: Diagnosis not present

## 2022-07-05 DIAGNOSIS — Z79899 Other long term (current) drug therapy: Secondary | ICD-10-CM | POA: Diagnosis not present

## 2022-07-05 DIAGNOSIS — I4729 Other ventricular tachycardia: Secondary | ICD-10-CM | POA: Diagnosis not present

## 2022-07-05 DIAGNOSIS — I358 Other nonrheumatic aortic valve disorders: Secondary | ICD-10-CM | POA: Diagnosis present

## 2022-07-05 DIAGNOSIS — Z87820 Personal history of traumatic brain injury: Secondary | ICD-10-CM | POA: Diagnosis not present

## 2022-07-05 DIAGNOSIS — I2699 Other pulmonary embolism without acute cor pulmonale: Secondary | ICD-10-CM | POA: Diagnosis not present

## 2022-07-05 DIAGNOSIS — R609 Edema, unspecified: Secondary | ICD-10-CM | POA: Diagnosis not present

## 2022-07-05 DIAGNOSIS — G250 Essential tremor: Secondary | ICD-10-CM | POA: Diagnosis not present

## 2022-07-05 DIAGNOSIS — E785 Hyperlipidemia, unspecified: Secondary | ICD-10-CM | POA: Diagnosis present

## 2022-07-05 DIAGNOSIS — E1151 Type 2 diabetes mellitus with diabetic peripheral angiopathy without gangrene: Secondary | ICD-10-CM | POA: Diagnosis present

## 2022-07-05 DIAGNOSIS — N390 Urinary tract infection, site not specified: Secondary | ICD-10-CM | POA: Diagnosis present

## 2022-07-05 DIAGNOSIS — E876 Hypokalemia: Secondary | ICD-10-CM | POA: Diagnosis present

## 2022-07-05 DIAGNOSIS — I472 Ventricular tachycardia, unspecified: Secondary | ICD-10-CM | POA: Diagnosis not present

## 2022-07-05 DIAGNOSIS — E1122 Type 2 diabetes mellitus with diabetic chronic kidney disease: Secondary | ICD-10-CM | POA: Diagnosis not present

## 2022-07-05 DIAGNOSIS — F1021 Alcohol dependence, in remission: Secondary | ICD-10-CM | POA: Diagnosis not present

## 2022-07-05 DIAGNOSIS — E1140 Type 2 diabetes mellitus with diabetic neuropathy, unspecified: Secondary | ICD-10-CM | POA: Diagnosis present

## 2022-07-05 DIAGNOSIS — I4821 Permanent atrial fibrillation: Secondary | ICD-10-CM | POA: Diagnosis not present

## 2022-07-05 DIAGNOSIS — I493 Ventricular premature depolarization: Secondary | ICD-10-CM | POA: Diagnosis present

## 2022-07-05 DIAGNOSIS — R9431 Abnormal electrocardiogram [ECG] [EKG]: Secondary | ICD-10-CM | POA: Diagnosis not present

## 2022-07-05 DIAGNOSIS — J449 Chronic obstructive pulmonary disease, unspecified: Secondary | ICD-10-CM | POA: Diagnosis present

## 2022-07-05 DIAGNOSIS — I70203 Unspecified atherosclerosis of native arteries of extremities, bilateral legs: Secondary | ICD-10-CM | POA: Diagnosis not present

## 2022-07-05 DIAGNOSIS — I130 Hypertensive heart and chronic kidney disease with heart failure and stage 1 through stage 4 chronic kidney disease, or unspecified chronic kidney disease: Secondary | ICD-10-CM | POA: Diagnosis not present

## 2022-07-05 LAB — BASIC METABOLIC PANEL
Anion gap: 13 (ref 5–15)
Anion gap: 8 (ref 5–15)
BUN: 12 mg/dL (ref 8–23)
BUN: 14 mg/dL (ref 8–23)
CO2: 23 mmol/L (ref 22–32)
CO2: 27 mmol/L (ref 22–32)
Calcium: 8.3 mg/dL — ABNORMAL LOW (ref 8.9–10.3)
Calcium: 8 mg/dL — ABNORMAL LOW (ref 8.9–10.3)
Chloride: 103 mmol/L (ref 98–111)
Chloride: 104 mmol/L (ref 98–111)
Creatinine, Ser: 1.51 mg/dL — ABNORMAL HIGH (ref 0.61–1.24)
Creatinine, Ser: 1.58 mg/dL — ABNORMAL HIGH (ref 0.61–1.24)
GFR, Estimated: 48 mL/min — ABNORMAL LOW (ref >60)
GFR, Estimated: 45 mL/min — ABNORMAL LOW (ref >60)
Glucose, Bld: 105 mg/dL — ABNORMAL HIGH (ref 70–99)
Glucose, Bld: 111 mg/dL — ABNORMAL HIGH (ref 70–99)
Potassium: 3.2 mmol/L — ABNORMAL LOW (ref 3.5–5.1)
Potassium: 3.5 mmol/L (ref 3.5–5.1)
Sodium: 139 mmol/L (ref 135–145)
Sodium: 139 mmol/L (ref 135–145)

## 2022-07-05 LAB — HEPARIN LEVEL (UNFRACTIONATED)
Heparin Unfractionated: 0.38 IU/mL (ref 0.30–0.70)
Heparin Unfractionated: 0.66 IU/mL (ref 0.30–0.70)

## 2022-07-05 LAB — LACTIC ACID, PLASMA: Lactic Acid, Venous: 2.8 mmol/L (ref 0.5–1.9)

## 2022-07-05 LAB — MAGNESIUM: Magnesium: 1.4 mg/dL — ABNORMAL LOW (ref 1.7–2.4)

## 2022-07-05 MED ORDER — HEPARIN (PORCINE) 25000 UT/250ML-% IV SOLN
1500.0000 [IU]/h | INTRAVENOUS | Status: DC
  Administered 2022-07-05 (×2): 1500 [IU]/h via INTRAVENOUS
  Filled 2022-07-05 (×2): qty 250

## 2022-07-05 MED ORDER — ORAL CARE MOUTH RINSE: 15.0000 mL | OROMUCOSAL | Status: DC | PRN

## 2022-07-05 MED ORDER — POTASSIUM CHLORIDE CRYS ER 20 MEQ PO TBCR
20.0000 meq | EXTENDED_RELEASE_TABLET | Freq: Once | ORAL | Status: AC
  Administered 2022-07-05: 20 meq via ORAL
  Filled 2022-07-05: qty 1

## 2022-07-05 MED ORDER — MAGNESIUM SULFATE 2 GM/50ML IV SOLN
2.0000 g | Freq: Once | INTRAVENOUS | Status: AC
  Administered 2022-07-05: 2 g via INTRAVENOUS
  Filled 2022-07-05: qty 50

## 2022-07-05 MED ORDER — LORAZEPAM 0.5 MG PO TABS
0.2500 mg | ORAL_TABLET | Freq: Once | ORAL | Status: AC
  Administered 2022-07-05: 0.25 mg via ORAL
  Filled 2022-07-05: qty 1

## 2022-07-05 NOTE — Progress Notes (Signed)
BLE venous duplex and ABI have been completed.   Results can be found under chart review under CV PROC. 07/05/2022 5:36 PM Orestes Geiman RVT, RDMS

## 2022-07-05 NOTE — Progress Notes (Signed)
ANTICOAGULATION CONSULT NOTE - Initial Consult  Pharmacy Consult for Heparin  Indication: pulmonary embolus  Allergies  Allergen Reactions   Ciprofloxacin Other (See Comments)    All over weakness    Patient Measurements: Height: '6\' 1"'$  (185.4 cm) Weight: 103.1 kg (227 lb 4.7 oz) IBW/kg (Calculated) : 79.9 Heparin DW: 100.8 kg  Vital Signs: Temp: 97.9 F (36.6 C) (12/16 0844) Temp Source: Oral (12/16 0844) BP: 126/70 (12/16 0844) Pulse Rate: 63 (12/16 0844)  Labs: Recent Labs    07/04/22 1149 07/05/22 0216 07/05/22 1305  HGB 14.3  --   --   HCT 45.7  --   --   PLT 258  --   --   HEPARINUNFRC  --   --  0.38  CREATININE 1.41* 1.51*  --      Estimated Creatinine Clearance: 53.3 mL/min (A) (by C-G formula based on SCr of 1.51 mg/dL (H)).   Medical History: Past Medical History:  Diagnosis Date   Adenoma 05/2008   Alcoholism in recovery [facility])    BPH (benign prostatic hyperplasia)    Cancer (HCC)    Prostate   Chronic LBP    Hip & Back -- Sees Dr. Maia Petties @ [place]   CKD (chronic kidney disease)    with AKI requiring brief CRRT 02/2020   Colon polyps    Complex renal cyst 12/2010   Controlled type 2 diabetes mellitus with neuropathy ([place]) 01/2007   COPD (chronic obstructive pulmonary disease) ([place])    Diabetes ([place])    Diverticulitis of colon    ED (erectile dysfunction)    s/p Penile prosthesis (09/2010)   History of prostate cancer    Dr. Karsten Ro   History of sick sinus syndrome    reduced BB dose for Bradycardia   HLD (hyperlipidemia)    Hypertension    Impaired glucose tolerance 12/21/2013   Nephrolithiasis    Osteoarthritis of both hips    Osteoarthritis of lumbosacral spine    with Disc Disease   PAF (paroxysmal atrial fibrillation) (HCC)    No longer on Amiodarone.  Not on Anticoaguation b/c no recurrence.   Paresthesias/numbness    Bilateral LE   Peripheral neuropathy 12/21/2013   Stroke [facility])    left occipital CVA ~ 2018;  possible TIA 01/24/22   Subdural hematoma ([place]) 04/04/2022   Anticoagulation stopped   Venous insufficiency      Assessment: 76 y/o M here with UTI, found to have new onset pulmonary embolus on CT, had a SDH a few months ago, repeat CT head today with no issues. CBC within normal limits and stable. Scr 1.51. First heparin level is therapeutic at 0.38. No known issues with the line or s/sx of bleeding.  Goal of Therapy:  Heparin level 0.3-0.7 units/ml Monitor platelets by anticoagulation protocol: Yes   Plan:  Continue heparin drip at 1500 units/hr 2100 Heparin level Daily CBC/Heparin level Monitor for bleeding/mental status changes  Varney Daily, PharmD PGY2 Pharmacy Resident  Please check AMION for all [facility] pharmacy phone numbers After 10:00 PM call main pharmacy [phone]

## 2022-07-05 NOTE — Progress Notes (Signed)
ANTICOAGULATION CONSULT NOTE - Initial Consult  Pharmacy Consult for Heparin  Indication: pulmonary embolus  Allergies  Allergen Reactions   Ciprofloxacin Other (See Comments)    All over weakness    Patient Measurements: Height: '6\' 1"'$  (185.4 cm) Weight: 103.1 kg (227 lb 4.7 oz) IBW/kg (Calculated) : 79.9  Vital Signs: Temp: 97.8 F (36.6 C) (12/16 0117) Temp Source: Oral (12/16 0117) BP: 132/81 (12/16 0117) Pulse Rate: 79 (12/16 0117)  Labs: Recent Labs    07/04/22 1149 07/05/22 0216  HGB 14.3  --   HCT 45.7  --   PLT 258  --   CREATININE 1.41* 1.51*    Estimated Creatinine Clearance: 53.3 mL/min (A) (by C-G formula based on SCr of 1.51 mg/dL (H)).   Medical History: Past Medical History:  Diagnosis Date   Adenoma 05/2008   Alcoholism in recovery [facility])    BPH (benign prostatic hyperplasia)    Cancer (HCC)    Prostate   Chronic LBP    Hip & Back -- Sees Dr. Maia Petties @ [place]   CKD (chronic kidney disease)    with AKI requiring brief CRRT 02/2020   Colon polyps    Complex renal cyst 12/2010   Controlled type 2 diabetes mellitus with neuropathy ([place]) 01/2007   COPD (chronic obstructive pulmonary disease) ([place])    Diabetes ([place])    Diverticulitis of colon    ED (erectile dysfunction)    s/p Penile prosthesis (09/2010)   History of prostate cancer    Dr. Karsten Ro   History of sick sinus syndrome    reduced BB dose for Bradycardia   HLD (hyperlipidemia)    Hypertension    Impaired glucose tolerance 12/21/2013   Nephrolithiasis    Osteoarthritis of both hips    Osteoarthritis of lumbosacral spine    with Disc Disease   PAF (paroxysmal atrial fibrillation) (HCC)    No longer on Amiodarone.  Not on Anticoaguation b/c no recurrence.   Paresthesias/numbness    Bilateral LE   Peripheral neuropathy 12/21/2013   Stroke [facility])    left occipital CVA ~ 2018; possible TIA 01/24/22   Subdural hematoma ([place]) 04/04/2022   Anticoagulation stopped    Venous insufficiency      Assessment: 76 y/o M here with UTI, found to have new onset pulmonary embolus on CT, had a SDH a few months ago, repeat CT head today with no issues. CBC good. Scr 1.51.   Goal of Therapy:  Heparin level 0.3-0.7 units/ml Monitor platelets by anticoagulation protocol: Yes   Plan:  No bolus per MD Start heparin drip at 1500 units/hr 1300 Heparin level Daily CBC/Heparin level Monitor for bleeding/mental status changes  Narda Bonds, PharmD, BCPS Clinical Pharmacist Phone: [phone]

## 2022-07-05 NOTE — Progress Notes (Signed)
Bladder scan is 52-90 cc.

## 2022-07-05 NOTE — Progress Notes (Signed)
PROGRESS NOTE    [name]  BUL:[id number] DOB: [birth year] DOA: 07/04/2022 PCP: Biagio Borg, MD   Brief Narrative: 76 year old with past medical history significant for PAF not on anticoagulation secondary to frequent fall, subdural hematoma secondary to fall 04/20/2022, chronic ambulation dysfunction uses roller walker, COPD, prostate cancer status post prostatectomy, BPH, COPD, chronic heart failure preserved ejection fraction, CKD stage II, CVA brought by family due to worsening confusion and evaluation for urinary symptoms.  Patient presented with generalized weakness, confusion and agitation for the last 2 weeks.  He has become more unsteady on his feet.  Recent diagnosed with E. coli UTI on December 6 and started on ampicillin.  Patient has not been able to start taking antibiotics due to decreased oral intake and confusion.  Family noticed blood in the urine.  CT chest was done to evaluate pulmonary nodule, he was found to have Large volume pulmonary embolism in the right main pulmonary artery and lobar left upper and lower lobe pulmonary arteries. No findings to suggest right heart strain.  Repeated CT head showed resolution of prior subdural hematoma.  Dr. Claria Dice discussed with neurosurgery on-call and okay to proceed with heparin drip.    Assessment & Plan:   Principal Problem:   UTI (urinary tract infection) Active Problems:   COPD (chronic obstructive pulmonary disease) (HCC)   Benign essential tremor   Chronic kidney disease, stage 3b (HCC)   Acute lower UTI   1-Bilateral PE, acute hypoxic respiratory failure oxygen saturation on room air 86%. CT chest : Large volume pulmonary embolism in the right main pulmonary artery and lobar left upper and lower lobe pulmonary arteries. Continue with heparin gtt for 48 hours to make sure no new focal deficit.  ECHO order, doppler ordered.  98 % Saturation is stable on 2 L of oxygen  2-Subdural Hematoma;  -Recent  diagnosed with subdural hematoma due to fall on 9/15. -Progression of subdural hematoma, and Headaches 9/28; admitted to the hospital, he underwent diagnostic cerebral angiogram and attempt of right MMA embolization but procedure was aborted due to likelihood of increased risk of a stroke and other significant morbidity. -CT head done 10/18: Interval decrease in size of right cerebral convexity subdural hematoma, now measuring up to 5 mm, previously 10 mm.  -CT head 12/16: No acute intracranial abnormalities.  Case discussed with neurosurgeon on call, ok to start heparin.    3-Acute Metabolic Encephalopathy;  In setting of UTI.  Delirium precaution.    4-Incidental lungs nodule, CT chest showed PE.  Small right pleural effusion, partially loculated. Loculated fluid accounts for the radiographic appearance. No suspicious pulmonary nodules.   5-UTI, hematuria;  UA with more than 50 WBC,  Follow urine culture Continue with IV ceftriaxone.  Hematuria resolved.   6-Hypokalemia; replete orally.  7-5 runs VT ; replete k, continue with coreg. Check mg. Will give I g IV mg. Checking already ECHO 8-Cold extremities;  Daughter notice worsening cold feet. After removing stocking compression, cold extremities  improved. Patient denies feet pain. Plan to proceed with ABI.   9-Acute on chronic heart failure preserved ejection fraction: He has significant lower extremity edema.  Will hold Lasix due to increased creatinine.  He might need albumin and Lasix tomorrow. Cold extremity.  Will check echo  10-chronic A-fib continue with Coreg. BPH; Started on flomax.  Needs follow up with urology  PVR   CKD stage II;  1.3--1.5 Monitor  Estimated body mass index is 29.99 kg/m as calculated from  the following:   Height as of this encounter: '6\' 1"'$  (1.854 m).   Weight as of this encounter: 103.1 kg.   DVT prophylaxis: Heparin Code Status: Full code Family Communication: Discussed with  daughter Disposition Plan:  Status is: Observation The patient will require care spanning > 2 midnights and should be moved to inpatient because: Management of PE and respiratory failure    Consultants:  None  Procedures:  Echo Doppler ABI  Antimicrobials:    Subjective: He is alert, answer questions. Breathing ok. Denies feet pain.  Daughter notice cold extremities.    Objective: Vitals:   07/04/22 2100 07/05/22 0117 07/05/22 0120 07/05/22 0622  BP: 135/89 132/81  139/85  Pulse:  79  80  Resp:  18  17  Temp:  97.8 F (36.6 C)  98.2 F (36.8 C)  TempSrc:  Oral  Oral  SpO2:  100%  100%  Weight:   103.1 kg   Height:   '6\' 1"'$  (1.854 m)     Intake/Output Summary (Last 24 hours) at 07/05/2022 0801 Last data filed at 07/05/2022 0617 Gross per 24 hour  Intake 1104.48 ml  Output 400 ml  Net 704.48 ml   Filed Weights   07/05/22 0120  Weight: 103.1 kg    Examination:  General exam: Appears calm and comfortable  Respiratory system: Clear to auscultation. Respiratory effort normal. Cardiovascular system: S1 & S2 heard, RRR. No JVD, murmurs, rubs, gallops or clicks. No pedal edema. Gastrointestinal system: Abdomen is nondistended, soft and nontender. No organomegaly or masses felt. Normal bowel sounds heard. Central nervous system: Alert and oriented. No focal neurological deficits. Extremities: plus 2 edema    Data Reviewed: I have personally reviewed following labs and imaging studies  CBC: Recent Labs  Lab 07/04/22 1149  WBC 6.3  NEUTROABS 4.4  HGB 14.3  HCT 45.7  MCV 84.3  PLT 470   Basic Metabolic Panel: Recent Labs  Lab 07/04/22 1149 07/05/22 0216  NA 140 139  K 3.3* 3.2*  CL 104 103  CO2 26 23  GLUCOSE 114* 105*  BUN 11 12  CREATININE 1.41* 1.51*  CALCIUM 8.5* 8.3*  MG 1.4*  --    GFR: Estimated Creatinine Clearance: 53.3 mL/min (A) (by C-G formula based on SCr of 1.51 mg/dL (H)). Liver Function Tests: Recent Labs  Lab  07/04/22 1149  AST 26  ALT 13  ALKPHOS 132*  BILITOT 0.6  PROT 7.6  ALBUMIN 2.4*   No results for input(s): "LIPASE", "AMYLASE" in the last 168 hours. No results for input(s): "AMMONIA" in the last 168 hours. Coagulation Profile: No results for input(s): "INR", "PROTIME" in the last 168 hours. Cardiac Enzymes: No results for input(s): "CKTOTAL", "CKMB", "CKMBINDEX", "TROPONINI" in the last 168 hours. BNP (last 3 results) No results for input(s): "PROBNP" in the last 8760 hours. HbA1C: No results for input(s): "HGBA1C" in the last 72 hours. CBG: No results for input(s): "GLUCAP" in the last 168 hours. Lipid Profile: No results for input(s): "CHOL", "HDL", "LDLCALC", "TRIG", "CHOLHDL", "LDLDIRECT" in the last 72 hours. Thyroid Function Tests: No results for input(s): "TSH", "T4TOTAL", "FREET4", "T3FREE", "THYROIDAB" in the last 72 hours. Anemia Panel: No results for input(s): "VITAMINB12", "FOLATE", "FERRITIN", "TIBC", "IRON", "RETICCTPCT" in the last 72 hours. Sepsis Labs: Recent Labs  Lab 07/04/22 1758 07/05/22 0216  LATICACIDVEN 2.2* 2.8*    Recent Results (from the past 240 hour(s))  Urine Culture     Status: None   Collection Time: 06/25/22  2:32  PM   Specimen: Urine  Result Value Ref Range Status   Source: URINE  Final   Status: FINAL  Final   Result:   Final    Less than 10,000 CFU/mL of single Gram negative organism isolated. No further testing will be performed. If clinically indicated, recollection using a method to minimize contamination, with prompt transfer to Urine Culture Transport Tube, is recommended.         Radiology Studies: CT HEAD WO CONTRAST (5MM)  Result Date: 07/05/2022 CLINICAL DATA:  Altered mental status EXAM: CT HEAD WITHOUT CONTRAST TECHNIQUE: Contiguous axial images were obtained from the base of the skull through the vertex without intravenous contrast. RADIATION DOSE REDUCTION: This exam was performed according to the departmental  dose-optimization program which includes automated exposure control, adjustment of the mA and/or kV according to patient size and/or use of iterative reconstruction technique. COMPARISON:  05/07/2022 FINDINGS: Brain: There is no mass, hemorrhage or extra-axial collection. There is generalized atrophy without lobar predilection. Hypodensity of the white matter is most commonly associated with chronic microvascular disease. Previously seen right subdural hematoma has resolved. Vascular: No abnormal hyperdensity of the major intracranial arteries or dural venous sinuses. No intracranial atherosclerosis. Skull: The visualized skull base, calvarium and extracranial soft tissues are normal. Sinuses/Orbits: No fluid levels or advanced mucosal thickening of the visualized paranasal sinuses. No mastoid or middle ear effusion. The orbits are normal. IMPRESSION: 1. No acute intracranial abnormality. 2. Generalized atrophy and findings of chronic microvascular disease. 3. Previously seen right subdural hematoma has resolved. Electronically Signed   By: Ulyses Jarred M.D.   On: 07/05/2022 03:56   CT Chest W Contrast  Result Date: 07/04/2022 CLINICAL DATA:  Abnormal chest radiograph EXAM: CT CHEST WITH CONTRAST TECHNIQUE: Multidetector CT imaging of the chest was performed during intravenous contrast administration. RADIATION DOSE REDUCTION: This exam was performed according to the departmental dose-optimization program which includes automated exposure control, adjustment of the mA and/or kV according to patient size and/or use of iterative reconstruction technique. CONTRAST:  25m OMNIPAQUE IOHEXOL 350 MG/ML SOLN COMPARISON:  Chest radiographs dated 07/04/2022 and 04/30/2022 FINDINGS: Cardiovascular: Study is not tailored for evaluation of the pulmonary arteries. However, there are filling defects in the right main pulmonary artery in the lobar left upper and lower lobe pulmonary arteries (series 3/images 62 and 71),  reflecting large volume pulmonary embolism. The heart is top-normal in size. No pericardial effusion. No findings to suggest right heart strain. No evidence of thoracic aortic aneurysm or dissection. Atherosclerotic calcifications of the aortic arch. Mediastinum/Nodes: No suspicious mediastinal lymphadenopathy. Visualized thyroid is unremarkable. Lungs/Pleura: Small right pleural effusion with two foci of loculated fluid along the right major fissure (series 4/image 56), corresponding to the nodular opacities on chest radiograph. Mild linear scarring/atelectasis in the right upper lobe and left lower lobe. No focal consolidation. No suspicious pulmonary nodules. Mild centrilobular and paraseptal emphysematous changes, upper lung predominant. No pneumothorax. Upper Abdomen: Visualized upper abdomen is notable for scattered renal cysts measuring up to 8.4 cm in the left upper pole (series 3/image 117), benign. Vascular calcifications. Musculoskeletal: Degenerative changes of the visualized thoracolumbar spine. IMPRESSION: Large volume pulmonary embolism in the right main pulmonary artery and lobar left upper and lower lobe pulmonary arteries. No findings to suggest right heart strain. Critical Value/emergent results were called by telephone at the time of interpretation on 07/04/2022 at 11:47 pm to provider JULIE HAVILAND , who verbally acknowledged these results. Small right pleural effusion, partially loculated. Loculated fluid  accounts for the radiographic appearance. No suspicious pulmonary nodules. Aortic Atherosclerosis (ICD10-I70.0) and Emphysema (ICD10-J43.9). Electronically Signed   By: Julian Hy M.D.   On: 07/04/2022 23:49   DG Chest Portable 1 View  Result Date: 07/04/2022 CLINICAL DATA:  Altered mental status EXAM: PORTABLE CHEST 1 VIEW COMPARISON:  Chest x-ray 04/30/2022.  Chest CT 01/10/2011 FINDINGS: There are 2 oval densities in the right mid lung with the largest measuring 3.8 x 1.9 cm,  indeterminate. The lungs are otherwise clear. Costophrenic angles are clear. There is no pneumothorax. Cardiomediastinal silhouette is within normal limits. There is mild elevation of the left hemidiaphragm. No acute fractures are seen. IMPRESSION: There are 2 oval densities in the right mid lung with the largest measuring 3.8 x 1.9 cm, indeterminate. CT chest is recommended for further evaluation. Electronically Signed   By: Ronney Asters M.D.   On: 07/04/2022 18:19        Scheduled Meds:  aspirin EC  81 mg Oral Daily   carvedilol  3.125 mg Oral BID WC   rosuvastatin  20 mg Oral QPM   tamsulosin  0.4 mg Oral QPC supper   Continuous Infusions:  cefTRIAXone (ROCEPHIN)  IV     heparin 1,500 Units/hr (07/05/22 0427)     LOS: 0 days    Time spent: 35 minutes    Eragon Hammond A Philomina Leon, MD [facility]   If 7PM-7AM, please contact night-coverage [email or web address]  07/05/2022, 8:01 AM

## 2022-07-05 NOTE — Progress Notes (Signed)
[facility] for Heparin  Indication: pulmonary embolus  Allergies  Allergen Reactions   Ciprofloxacin Other (See Comments)    All over weakness    Patient Measurements: Height: '6\' 1"'$  (185.4 cm) Weight: 103.1 kg (227 lb 4.7 oz) IBW/kg (Calculated) : 79.9 Heparin DW: 100.8 kg  Vital Signs: Temp: 97.7 F (36.5 C) (12/16 1655) Temp Source: Oral (12/16 1655) BP: 127/74 (12/16 1655) Pulse Rate: 62 (12/16 1655)  Labs: Recent Labs    07/04/22 1149 07/05/22 0216 07/05/22 1305 07/05/22 2126  HGB 14.3  --   --   --   HCT 45.7  --   --   --   PLT 258  --   --   --   HEPARINUNFRC  --   --  0.38 0.66  CREATININE 1.41* 1.51*  --   --      Estimated Creatinine Clearance: 53.3 mL/min (A) (by C-G formula based on SCr of 1.51 mg/dL (H)).   Medical History: Past Medical History:  Diagnosis Date   Adenoma 05/2008   Alcoholism in recovery [facility])    BPH (benign prostatic hyperplasia)    Cancer (HCC)    Prostate   Chronic LBP    Hip & Back -- Sees Dr. Maia Petties @ [place]   CKD (chronic kidney disease)    with AKI requiring brief CRRT 02/2020   Colon polyps    Complex renal cyst 12/2010   Controlled type 2 diabetes mellitus with neuropathy ([place]) 01/2007   COPD (chronic obstructive pulmonary disease) ([place])    Diabetes ([place])    Diverticulitis of colon    ED (erectile dysfunction)    s/p Penile prosthesis (09/2010)   History of prostate cancer    Dr. Karsten Ro   History of sick sinus syndrome    reduced BB dose for Bradycardia   HLD (hyperlipidemia)    Hypertension    Impaired glucose tolerance 12/21/2013   Nephrolithiasis    Osteoarthritis of both hips    Osteoarthritis of lumbosacral spine    with Disc Disease   PAF (paroxysmal atrial fibrillation) (HCC)    No longer on Amiodarone.  Not on Anticoaguation b/c no recurrence.   Paresthesias/numbness    Bilateral LE   Peripheral neuropathy 12/21/2013   Stroke [facility])    left  occipital CVA ~ 2018; possible TIA 01/24/22   Subdural hematoma ([place]) 04/04/2022   Anticoagulation stopped   Venous insufficiency      Assessment: 76 y/o M here with UTI, found to have new onset pulmonary embolus on CT, had a SDH a few months ago, repeat CT head today with no issues. CBC within normal limits and stable. Scr 1.51.   Confirmatory heparin level remains therapeutic.  Goal of Therapy:  Heparin level 0.3-0.7 units/ml Monitor platelets by anticoagulation protocol: Yes   Plan:  Continue heparin drip at 1500 units/hr Daily heparin level and CBC  Arrie Senate, PharmD, [place], [facility] Clinical Pharmacist [phone] Please check AMION for all [facility] numbers 07/05/2022

## 2022-07-05 NOTE — Progress Notes (Addendum)
Contacted by ER physician Dr. Gilford Raid.  CT chest w contrast positive for large volume pulmonary embolism in the right main pulmonary artery and lobar left upper and lower lobe pulmonary arteries. No findings to suggest right heart strain. Patient with recent fall leading to subdural hematoma maximum size 12 mm noted on CT head 04/20/2022.  Most recent CT head 05/07/2022 showed interval decrease in size to 5 mm, no midline shift no mass effect.  Repeat CT head done, prior subdural hematoma resolved.  Discussed with neurosurgeon on-call, okay to anticoagulate.  Heparin drip started  Lower extremity Dopplers ordered

## 2022-07-05 NOTE — Progress Notes (Signed)
Pt back from echo

## 2022-07-06 ENCOUNTER — Inpatient Hospital Stay (HOSPITAL_COMMUNITY): Payer: Medicare Other

## 2022-07-06 DIAGNOSIS — R9431 Abnormal electrocardiogram [ECG] [EKG]: Secondary | ICD-10-CM | POA: Diagnosis not present

## 2022-07-06 DIAGNOSIS — I472 Ventricular tachycardia, unspecified: Secondary | ICD-10-CM

## 2022-07-06 DIAGNOSIS — G9341 Metabolic encephalopathy: Secondary | ICD-10-CM | POA: Diagnosis not present

## 2022-07-06 DIAGNOSIS — I70203 Unspecified atherosclerosis of native arteries of extremities, bilateral legs: Secondary | ICD-10-CM

## 2022-07-06 LAB — BASIC METABOLIC PANEL WITH GFR
Anion gap: 8 (ref 5–15)
BUN: 12 mg/dL (ref 8–23)
CO2: 25 mmol/L (ref 22–32)
Calcium: 8.2 mg/dL — ABNORMAL LOW (ref 8.9–10.3)
Chloride: 104 mmol/L (ref 98–111)
Creatinine, Ser: 1.59 mg/dL — ABNORMAL HIGH (ref 0.61–1.24)
GFR, Estimated: 45 mL/min — ABNORMAL LOW
Glucose, Bld: 119 mg/dL — ABNORMAL HIGH (ref 70–99)
Potassium: 3.8 mmol/L (ref 3.5–5.1)
Sodium: 137 mmol/L (ref 135–145)

## 2022-07-06 LAB — BASIC METABOLIC PANEL
Anion gap: 9 (ref 5–15)
BUN: 13 mg/dL (ref 8–23)
CO2: 25 mmol/L (ref 22–32)
Calcium: 7.9 mg/dL — ABNORMAL LOW (ref 8.9–10.3)
Chloride: 103 mmol/L (ref 98–111)
Creatinine, Ser: 1.46 mg/dL — ABNORMAL HIGH (ref 0.61–1.24)
GFR, Estimated: 50 mL/min — ABNORMAL LOW (ref >60)
Glucose, Bld: 102 mg/dL — ABNORMAL HIGH (ref 70–99)
Potassium: 2.9 mmol/L — ABNORMAL LOW (ref 3.5–5.1)
Sodium: 137 mmol/L (ref 135–145)

## 2022-07-06 LAB — PHOSPHORUS: Phosphorus: 2.8 mg/dL (ref 2.5–4.6)

## 2022-07-06 LAB — CBC
HCT: 36.4 % — ABNORMAL LOW (ref 39.0–52.0)
Hemoglobin: 12 g/dL — ABNORMAL LOW (ref 13.0–17.0)
MCH: 27 pg (ref 26.0–34.0)
MCHC: 33 g/dL (ref 30.0–36.0)
MCV: 82 fL (ref 80.0–100.0)
Platelets: 190 10*3/uL (ref 150–400)
RBC: 4.44 MIL/uL (ref 4.22–5.81)
RDW: 14.5 % (ref 11.5–15.5)
WBC: 6.1 10*3/uL (ref 4.0–10.5)
nRBC: 0 % (ref 0.0–0.2)

## 2022-07-06 LAB — HEPARIN LEVEL (UNFRACTIONATED)
Heparin Unfractionated: 0.7 [IU]/mL (ref 0.30–0.70)
Heparin Unfractionated: 1.09 [IU]/mL — ABNORMAL HIGH (ref 0.30–0.70)
Heparin Unfractionated: 0.75 IU/mL — ABNORMAL HIGH (ref 0.30–0.70)

## 2022-07-06 LAB — MAGNESIUM: Magnesium: 1.9 mg/dL (ref 1.7–2.4)

## 2022-07-06 LAB — ECHOCARDIOGRAM COMPLETE
Height: 73 in
S' Lateral: 2.3 cm
Weight: 3636.71 oz

## 2022-07-06 LAB — TROPONIN I (HIGH SENSITIVITY)
Troponin I (High Sensitivity): 37 ng/L — ABNORMAL HIGH (ref <18)
Troponin I (High Sensitivity): 31 ng/L — ABNORMAL HIGH (ref <18)

## 2022-07-06 MED ORDER — MAGNESIUM SULFATE 2 GM/50ML IV SOLN
2.0000 g | Freq: Once | INTRAVENOUS | Status: AC
  Administered 2022-07-06: 2 g via INTRAVENOUS
  Filled 2022-07-06: qty 50

## 2022-07-06 MED ORDER — HEPARIN (PORCINE) 25000 UT/250ML-% IV SOLN: 1100.0000 [IU]/h | INTRAVENOUS | Status: AC

## 2022-07-06 MED ORDER — HEPARIN (PORCINE) 25000 UT/250ML-% IV SOLN
1300.0000 [IU]/h | INTRAVENOUS | Status: DC
  Administered 2022-07-06 (×2): 1300 [IU]/h via INTRAVENOUS
  Filled 2022-07-06: qty 250

## 2022-07-06 MED ORDER — POTASSIUM CHLORIDE 10 MEQ/100ML IV SOLN
10.0000 meq | INTRAVENOUS | Status: AC
  Administered 2022-07-06 (×3): 10 meq via INTRAVENOUS
  Filled 2022-07-06 (×3): qty 100

## 2022-07-06 MED ORDER — POTASSIUM CHLORIDE 20 MEQ PO PACK
20.0000 meq | PACK | Freq: Once | ORAL | Status: AC
  Administered 2022-07-06: 20 meq via ORAL
  Filled 2022-07-06: qty 1

## 2022-07-06 MED ORDER — POTASSIUM CHLORIDE CRYS ER 20 MEQ PO TBCR
40.0000 meq | EXTENDED_RELEASE_TABLET | Freq: Once | ORAL | Status: AC
  Administered 2022-07-06: 40 meq via ORAL
  Filled 2022-07-06: qty 2

## 2022-07-06 MED ORDER — QUETIAPINE 12.5 MG HALF TABLET
12.5000 mg | ORAL_TABLET | Freq: Every day | ORAL | Status: DC
  Administered 2022-07-06: 12.5 mg via ORAL
  Filled 2022-07-06 (×3): qty 1

## 2022-07-06 MED ORDER — ROSUVASTATIN CALCIUM 20 MG PO TABS
40.0000 mg | ORAL_TABLET | Freq: Every evening | ORAL | Status: DC
  Administered 2022-07-06 – 2022-07-07 (×2): 40 mg via ORAL
  Filled 2022-07-06 (×2): qty 2

## 2022-07-06 NOTE — Progress Notes (Signed)
   07/06/22 0005  Pain Assessment  Pain Scale 0-10  Pain Score 0  Provider Notification  Provider Name/Title Raenette Rover  Date Provider Notified 07/06/22  Time Provider Notified 0005  Method of Notification Page (secure chat)  Notification Reason Other (Comment) (multiple Vtachs,patient refused vital signs)  Test performed and critical result EKG  Provider response See new orders  Date of Provider Response 07/06/22  Time of Provider Response [phone]

## 2022-07-06 NOTE — Progress Notes (Signed)
Patient  has Atrial fIb with  VTach,on call informed and orders given,will continue to monitor.

## 2022-07-06 NOTE — Consult Note (Signed)
Cardiology Consultation:   Patient ID: [name] MRN: [id number]; DOB: 1946/06/07  Admit date: 07/04/2022 Date of Consult: 07/06/2022  Primary Care Provider: Biagio Borg, MD Primary Cardiologist: Glenetta Hew, MD  Primary Electrophysiologist:  None    Patient Profile:   [name] is a 76 y.o. male with a hx of paroxysmal atrial fibrillation, HFpEF, CKD 2, COPD, BPH who is being seen today for the evaluation of nonsustained VT at the request of hospital medicine.  History of Present Illness:   [name] is a 76 year old man with a history of paroxysmal atrial fibrillation, HFpEF, CKD, BPH, COPD who is admitted to the hospital for management of urinary tract infection and pulmonary embolism.  Notably he had been having some generalized weakness and episodes of confusion for the last 2 weeks.  Was diagnosed with a urinary tract infection by his primary care on 12/6.  Was brought in due to persistent confusion.  Now found to have PE and urinary tract infection.  On telemetry entry, primary team noted that he has had several episodes of nonsustained VT.  All of the episodes have been less than 20 beat runs.  No associated symptoms during these episodes.  He is not having any chest pain.  Notes that his breathing is stable.  When I saw him, his mental status appeared to be intact as he was able to talk to me about his longstanding history of atrial fibrillation.  Not reporting any new symptoms.  Past Medical History:  Diagnosis Date   Adenoma 05/2008   Alcoholism in recovery [facility])    BPH (benign prostatic hyperplasia)    Cancer (HCC)    Prostate   Chronic LBP    Hip & Back -- Sees Dr. Maia Petties @ [facility]   CKD (chronic kidney disease)    with AKI requiring brief CRRT 02/2020   Colon polyps    Complex renal cyst 12/2010   Controlled type 2 diabetes mellitus with neuropathy ([place]) 01/2007   COPD (chronic obstructive pulmonary disease) ([facility])     Diabetes ([place])    Diverticulitis of colon    ED (erectile dysfunction)    s/p Penile prosthesis (09/2010)   History of prostate cancer    Dr. Karsten Ro   History of sick sinus syndrome    reduced BB dose for Bradycardia   HLD (hyperlipidemia)    Hypertension    Impaired glucose tolerance 12/21/2013   Nephrolithiasis    Osteoarthritis of both hips    Osteoarthritis of lumbosacral spine    with Disc Disease   PAF (paroxysmal atrial fibrillation) (HCC)    No longer on Amiodarone.  Not on Anticoaguation b/c no recurrence.   Paresthesias/numbness    Bilateral LE   Peripheral neuropathy 12/21/2013   Stroke [facility])    left occipital CVA ~ 2018; possible TIA 01/24/22   Subdural hematoma ([place]) 04/04/2022   Anticoagulation stopped   Venous insufficiency     Past Surgical History:  Procedure Laterality Date   IR ANGIO INTRA EXTRACRAN SEL COM CAROTID INNOMINATE UNI R MOD SED  04/17/2022   LEFT HEART CATH AND CORONARY ANGIOGRAPHY  2003   Normal Coronary Arteries.   NM MYOVIEW LTD  02/21/2020   EF 60%.  Medium sized mild severity defect in the basal inferior, mid inferior and apical inferior location-suggesting of ischemia.  Read as low-intermediate risk.  (On cardiology review this is felt to be a fixed defect either related to prior infarct versus diaphragmatic attenuation.  Felt  to be low risk)   PENILE PROSTHESIS IMPLANT  06/21/2012   Procedure: PENILE PROTHESIS INFLATABLE;  Surgeon: Claybon Jabs, MD;  Location: [facility];  Service: Urology;  Laterality: N/A;  REMOVAL AND REPLACEMENT OF SOME  OF PROSTHESIS ([facility])    PENILE PROSTHESIS PLACEMENT  09/2010   PROSTATECTOMY     RADIOLOGY WITH ANESTHESIA N/A 04/17/2022   Procedure: Right middle meningeal artery embolization;  Surgeon: Consuella Lose, MD;  Location: [place];  Service: Radiology;  Laterality: N/A;   REMOVAL OF PENILE PROSTHESIS N/A 02/15/2018   Procedure: REMOVAL OF PENILE PROSTHESIS;  Surgeon: Kathie Rhodes, MD;   Location: [facility];  Service: Urology;  Laterality: N/A;   TRANSTHORACIC ECHOCARDIOGRAM  01/2022   EF 70-75%:Marland Kitchen  Hyperdynamic LV with no RWMA.  Mild LVH.  Severely dilated left atrium suggesting notable diastolic dysfunction, but unable to interpret.  Mildly enlarged RV with normal function.  Normal PAP despite moderate dilated RA and increased RAP/CVP.Marland Kitchen  No MS or MR.  No AAS or AI.  Aortic root measuring 42 mm with ascending aorta measuring 41 mm.   TRANSTHORACIC ECHOCARDIOGRAM  02/17/2020   EF 60 to 65%.  No R WMA.  Unable to assess diastolic parameters because of A. fib.  Mildly elevated PA pressures.  Mild LA dilation.  Mild aortic valve sclerosis but no stenosis.  Normal IVC.     Home Medications:  Prior to Admission medications   Medication Sig Start Date End Date Taking? Authorizing Provider  ampicillin (PRINCIPEN) 500 MG capsule Take 1 capsule (500 mg total) by mouth 4 (four) times daily. 06/16/22  Yes Biagio Borg, MD  carvedilol (COREG) 3.125 MG tablet Take 1 tablet (3.125 mg total) by mouth 2 (two) times daily with a meal. 06/03/22  Yes Leonie Man, MD  furosemide (LASIX) 40 MG tablet TAKE 1 TABLET BY MOUTH DAILY .  MAY TAKE AN ADDITIONAL 1 TABLET  BY MOUTH IF NEEDED FOR SWELLING 03/10/22  Yes Biagio Borg, MD  potassium chloride (KLOR-CON) 10 MEQ tablet Take 1 tablet (10 mEq total) by mouth daily. 06/03/22  Yes Leonie Man, MD  PROAIR HFA 108 [phone] Base) MCG/ACT inhaler INHALE 2 PUFFS INTO THE  LUNGS EVERY 6 HOURS AS  NEEDED Patient taking differently: Inhale 2 puffs into the lungs every 6 (six) hours as needed for shortness of breath or wheezing. 06/01/20  Yes Biagio Borg, MD  rosuvastatin (CRESTOR) 20 MG tablet Take 20 mg by mouth every evening.   Yes [provider]  Blood Glucose Monitoring Suppl (ACCU-CHEK GUIDE ME) w/Device KIT Use as directed twice per day E11.9 04/02/22   Biagio Borg, MD  cephALEXin (KEFLEX) 500 MG capsule Take 1 capsule (500 mg total) by mouth  3 (three) times daily. 07/04/22   Biagio Borg, MD  glucose blood (ACCU-CHEK GUIDE) test strip Use as instructed twice per day E11.9 04/02/22   Biagio Borg, MD  Lancet Devices ([place] LANCING) [place]  09/19/20   [provider]  Lancets (ONETOUCH DELICA PLUS [id number]) [place] 10/29/20   Biagio Borg, MD  lidocaine (LIDODERM) 5 % Place 1 patch onto the skin daily. Remove & Discard patch within 12 hours or as directed by MD Patient not taking: Reported on 07/04/2022 04/20/22   Palumbo, April, MD    Inpatient Medications: Scheduled Meds:  carvedilol  3.125 mg Oral BID WC   QUEtiapine  12.5 mg Oral QHS  rosuvastatin  40 mg Oral QPM   tamsulosin  0.4 mg Oral QPC supper   Continuous Infusions:  cefTRIAXone (ROCEPHIN)  IV 2 g (07/06/22 1759)   heparin     PRN Meds: acetaminophen **OR** acetaminophen, albuterol, ondansetron **OR** ondansetron (ZOFRAN) IV, mouth rinse  Allergies:    Allergies  Allergen Reactions   Ciprofloxacin Other (See Comments)    All over weakness    Social History:   Social History   Socioeconomic History   Marital status: Single    Spouse name: Not on file   Number of children: 3   Years of education: Not on file   Highest education level: Not on file  Occupational History   Occupation: Retired  Tobacco Use   Smoking status: Former    Types: Cigarettes    Quit date: 11/04/1982    Years since quitting: 39.6   Smokeless tobacco: Never  Vaping Use   Vaping Use: Never used  Substance and Sexual Activity   Alcohol use: Not Currently   Drug use: No    Types: Marijuana    Comment: use to smoke marijuana   Sexual activity: Not Currently  Other Topics Concern   Not on file  Social History Narrative   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   Divorced. 3 grown children. 4 GC.     Quit smoking ~20+ yrs ago.    Social Determinants of Health   Financial Resource Strain: Not on file   Food Insecurity: No Food Insecurity (07/05/2022)   Hunger Vital Sign    Worried About Running Out of Food in the Last Year: Never true    Ran Out of Food in the Last Year: Never true  Transportation Needs: No Transportation Needs (07/05/2022)   PRAPARE - Hydrologist (Medical): No    Lack of Transportation (Non-Medical): No  Physical Activity: Not on file  Stress: Not on file  Social Connections: Not on file  Intimate Partner Violence: Not At Risk (07/05/2022)   Humiliation, Afraid, Rape, and Kick questionnaire    Fear of Current or Ex-Partner: No    Emotionally Abused: No    Physically Abused: No    Sexually Abused: No    Family History:    Family History  Problem Relation Age of Onset   Coronary artery disease Mother    Heart attack Mother    Coronary artery disease Father    Prostate cancer Father    Diabetes Father      Review of Systems: 12 point review systems negative as otherwise noted in the HPI  Physical Exam/Data:   Vitals:   07/06/22 0441 07/06/22 0853 07/06/22 1600 07/06/22 1933  BP: 132/73 115/66 120/80 134/87  Pulse: 69 (!) 53 (!) 58 66  Resp: _0 Temp: 97.6 F (36.4 C) 97.8 F (36.6 C) 98.3 F (36.8 C) 98.3 F (36.8 C)  TempSrc: Oral  Oral Oral  SpO2:  96% 98% 100%  Weight:      Height:        Intake/Output Summary (Last 24 hours) at 07/06/2022 1946 Last data filed at 07/06/2022 1748 Gross per 24 hour  Intake 914.35 ml  Output 300 ml  Net 614.35 ml   Filed Weights   07/05/22 0120  Weight: 103.1 kg   Body mass index is 29.99 kg/m.  General: Elderly chronically appearing male, in no apparent distress HEENT: normal Lymph: no adenopathy Neck:  no JVD Endocrine:  No thryomegaly Vascular: No carotid bruits; FA pulses 2+ bilaterally without bruits  Cardiac:  normal S1, S2; regular rate right, irregular rhythm; no murmur  Lungs:   decreased breath sounds throughout Abd: soft, nontender, no hepatomegaly   Ext: 1+ edema bilaterally, and chronic vascular changes in both feet Musculoskeletal:  No deformities, BUE and BLE strength normal and equal Skin: warm and dry  Neuro:  CNs 2-12 intact, no focal abnormalities noted Psych:  Normal affect   EKG:  The EKG was personally reviewed and demonstrates: Atrial fibrillation with PVCs Telemetry:  Telemetry was personally reviewed and demonstrates: Atrial fibrillation with regular rate.  Multiple episodes of PVC induced NSVT  Relevant CV Studies: Echo 07/06/2022: 1. Left ventricular ejection fraction, by estimation, is 65 to 70%. The  left ventricle has normal function. The left ventricle has no regional  wall motion abnormalities. There is severe asymmetric left ventricular  hypertrophy of the septal segment. Left   ventricular diastolic parameters are indeterminate.   2. Right ventricular systolic function is normal. The right ventricular  size is normal. Mildly increased right ventricular wall thickness.   3. Left atrial size was severely dilated.   4. Right atrial size was mildly dilated.   5. Large pleural effusion in the left lateral region.   6. The mitral valve is abnormal. No evidence of mitral valve  regurgitation. No evidence of mitral stenosis.   7. The aortic valve is tricuspid. Aortic valve regurgitation is not  visualized. No aortic stenosis is present.   8. The inferior vena cava is dilated in size with >50% respiratory  variability, suggesting right atrial pressure of 8 mmHg.   Comparison(s): No significant change from prior study.   Laboratory Data:  Chemistry Recent Labs  Lab 07/05/22 2126 07/06/22 0150 07/06/22 1543  NA 139 137 137  K 3.5 2.9* 3.8  CL 104 103 104  CO2 _0 GLUCOSE 111* 102* 119*  BUN _1 CREATININE 1.58* 1.46* 1.59*  CALCIUM 8.0* 7.9* 8.2*  GFRNONAA 45* 50* 45*  ANIONGAP _2 Recent Labs  Lab 07/04/22 1149  PROT 7.6  ALBUMIN 2.4*  AST 26  ALT 13  ALKPHOS 132*  BILITOT  0.6   Hematology Recent Labs  Lab 07/04/22 1149 07/06/22 0150  WBC 6.3 6.1  RBC 5.42 4.44  HGB 14.3 12.0*  HCT 45.7 36.4*  MCV 84.3 82.0  MCH 26.4 27.0  MCHC 31.3 33.0  RDW 14.6 14.5  PLT 258 190   Cardiac EnzymesNo results for input(s): "TROPONINI" in the last 168 hours. No results for input(s): "TROPIPOC" in the last 168 hours.  BNPNo results for input(s): "BNP", "PROBNP" in the last 168 hours.  DDimer No results for input(s): "DDIMER" in the last 168 hours.  Radiology/Studies:  ECHOCARDIOGRAM COMPLETE  Result Date: 07/06/2022    ECHOCARDIOGRAM REPORT   Patient Name:   [name] Date of Exam: 07/06/2022 Medical Rec #:  [id number]           Height:       73.0 in Accession #:    [id number]          Weight:       227.3 lb Date of Birth:  Oct 18, 1945           BSA:          2.272 m Patient Age:    58 years  BP:           115/66 mmHg Patient Gender: M                   HR:           60 bpm. Exam Location:  Inpatient Procedure: 2D Echo, Cardiac Doppler and Color Doppler Indications:    abnormal ecg  History:        Patient has prior history of Echocardiogram examinations, most                 recent 01/25/2022. COPD and chronic kidney disease,                 Arrythmias:Atrial Fibrillation; Risk Factors:Diabetes,                 Hypertension and Dyslipidemia.  Sonographer:    Johny Chess RDCS Referring Phys: [id number] BELKYS A REGALADO IMPRESSIONS  1. Left ventricular ejection fraction, by estimation, is 65 to 70%. The left ventricle has normal function. The left ventricle has no regional wall motion abnormalities. There is severe asymmetric left ventricular hypertrophy of the septal segment. Left  ventricular diastolic parameters are indeterminate.  2. Right ventricular systolic function is normal. The right ventricular size is normal. Mildly increased right ventricular wall thickness.  3. Left atrial size was severely dilated.  4. Right atrial size was mildly dilated.  5.  Large pleural effusion in the left lateral region.  6. The mitral valve is abnormal. No evidence of mitral valve regurgitation. No evidence of mitral stenosis.  7. The aortic valve is tricuspid. Aortic valve regurgitation is not visualized. No aortic stenosis is present.  8. The inferior vena cava is dilated in size with >50% respiratory variability, suggesting right atrial pressure of 8 mmHg. Comparison(s): No significant change from prior study. FINDINGS  Left Ventricle: Left ventricular ejection fraction, by estimation, is 65 to 70%. The left ventricle has normal function. The left ventricle has no regional wall motion abnormalities. The left ventricular internal cavity size was small. There is severe asymmetric left ventricular hypertrophy of the septal segment. Left ventricular diastolic parameters are indeterminate. Right Ventricle: The right ventricular size is normal. Mildly increased right ventricular wall thickness. Right ventricular systolic function is normal. Left Atrium: Left atrial size was severely dilated. Right Atrium: Right atrial size was mildly dilated. Pericardium: Trivial pericardial effusion is present. The pericardial effusion is lateral to the left ventricle. Mitral Valve: The mitral valve is abnormal. No evidence of mitral valve regurgitation. No evidence of mitral valve stenosis. Tricuspid Valve: The tricuspid valve is normal in structure. Tricuspid valve regurgitation is not demonstrated. No evidence of tricuspid stenosis. Aortic Valve: The aortic valve is tricuspid. Aortic valve regurgitation is not visualized. No aortic stenosis is present. Pulmonic Valve: The pulmonic valve was normal in structure. Pulmonic valve regurgitation is trivial. No evidence of pulmonic stenosis. Aorta: The aortic root and ascending aorta are structurally normal, with no evidence of dilitation. Venous: The inferior vena cava is dilated in size with greater than 50% respiratory variability, suggesting right  atrial pressure of 8 mmHg. IAS/Shunts: No atrial level shunt detected by color flow Doppler. Additional Comments: There is a large pleural effusion in the left lateral region.  LEFT VENTRICLE PLAX 2D LVIDd:         3.50 cm LVIDs:         2.30 cm LV PW:         1.40 cm LV IVS:  1.20 cm LVOT diam:     2.40 cm LVOT Area:     4.52 cm  RIGHT VENTRICLE          IVC RV Basal diam:  2.80 cm  IVC diam: 2.50 cm LEFT ATRIUM              Index        RIGHT ATRIUM           Index LA diam:        4.20 cm  1.85 cm/m   RA Area:     25.80 cm LA Vol (A2C):   106.0 ml 46.66 ml/m  RA Volume:   80.70 ml  35.52 ml/m LA Vol (A4C):   128.0 ml 56.34 ml/m LA Biplane Vol: 123.0 ml 54.14 ml/m                        PULMONIC VALVE AORTA                 PR End Diast Vel: 7.40 msec Ao Root diam: 3.80 cm Ao Asc diam:  3.30 cm  SHUNTS Systemic Diam: 2.40 cm Rudean Haskell MD Electronically signed by Rudean Haskell MD Signature Date/Time: 07/06/2022/5:42:32 PM    Final    VAS [place] ABI WITH/WO TBI  Result Date: 07/06/2022  LOWER EXTREMITY DOPPLER STUDY Patient Name:  [name]  Date of Exam:   07/05/2022 Medical Rec #: [id number]            Accession #:    [id number] Date of Birth: 1946/07/01            Patient Gender: M Patient Age:   76 years Exam Location[facility]  [facility] Procedure:      VAS [place] ABI WITH/WO TBI Referring Phys: Jerald Kief REGALADO --------------------------------------------------------------------------------  Indications: Peripheral artery disease. High Risk Factors: Hypertension, hyperlipidemia, Diabetes, prior CVA. Other Factors: CKD, CHF, Afib,.  Limitations: Today's exam was limited due to edema and thickened skin. Comparison Study: No previous exams Performing Technologist: Rogelia Rohrer RVT/RDMS  Examination Guidelines: A complete evaluation includes at minimum, Doppler waveform signals and systolic blood pressure reading at the level of bilateral brachial, anterior tibial, and posterior  tibial arteries, when vessel segments are accessible. Bilateral testing is considered an integral part of a complete examination. Photoelectric Plethysmograph (PPG) waveforms and toe systolic pressure readings are included as required and additional duplex testing as needed. Limited examinations for reoccurring indications may be performed as noted.  ABI Findings: +---------+------------------+-----+----------+--------+ Right    Rt Pressure (mmHg)IndexWaveform  Comment  +---------+------------------+-----+----------+--------+ PTA      87                0.70 monophasic         +---------+------------------+-----+----------+--------+ DP       83                0.67 monophasic         +---------+------------------+-----+----------+--------+ Great Toe                       Absent             +---------+------------------+-----+----------+--------+ +---------+------------------+-----+----------+-------+ Left     Lt Pressure (mmHg)IndexWaveform  Comment +---------+------------------+-----+----------+-------+ Brachial 124                                      +---------+------------------+-----+----------+-------+  PTA      99                0.80 monophasic        +---------+------------------+-----+----------+-------+ DP       120               0.97 monophasic        +---------+------------------+-----+----------+-------+ Great Toe                       Absent            +---------+------------------+-----+----------+-------+  Summary: Right: Resting right ankle-brachial index indicates moderate right lower extremity arterial disease. The right toe-brachial index is absent. Left: The left toe-brachial index is absent. Although ankle brachial indices are within normal limits (0.95-1.29), arterial Doppler waveforms at the ankle suggest some component of arterial occlusive disease. *See table(s) above for measurements and observations.  Electronically signed by Jamelle Haring on 07/06/2022 at 11:16:34 AM.    Final    VAS [place] LOWER EXTREMITY VENOUS (DVT)  Result Date: 07/06/2022  Lower Venous DVT Study Patient Name:  [name]  Date of Exam:   07/05/2022 Medical Rec #: [id number]            Accession #:    [id number] Date of Birth: [birth year]            Patient Gender: M Patient Age:   54 years Exam Location:  [facility] Procedure:      VAS [place] LOWER EXTREMITY VENOUS (DVT) Referring Phys: Wynetta Fines --------------------------------------------------------------------------------  Indications: Edema.  Risk Factors: Chronic venous insufficiency & edema. Limitations: Body habitus and poor ultrasound/tissue interface. Comparison Study: Previous exam on 10/24/20 was negative for DVT Performing Technologist: Rogelia Rohrer RVT, RDMS  Examination Guidelines: A complete evaluation includes B-mode imaging, spectral Doppler, color Doppler, and power Doppler as needed of all accessible portions of each vessel. Bilateral testing is considered an integral part of a complete examination. Limited examinations for reoccurring indications may be performed as noted. The reflux portion of the exam is performed with the patient in reverse Trendelenburg.  +---------+---------------+---------+-----------+----------+-------------------+ RIGHT    CompressibilityPhasicitySpontaneityPropertiesThrombus Aging      +---------+---------------+---------+-----------+----------+-------------------+ CFV      Full           Yes      Yes                                      +---------+---------------+---------+-----------+----------+-------------------+ SFJ      Full                                                             +---------+---------------+---------+-----------+----------+-------------------+ FV Prox  Full           Yes      Yes                                      +---------+---------------+---------+-----------+----------+-------------------+ FV Mid   Full            Yes      Yes                                      +---------+---------------+---------+-----------+----------+-------------------+  FV DistalFull           Yes      Yes                                      +---------+---------------+---------+-----------+----------+-------------------+ PFV      Full                                                             +---------+---------------+---------+-----------+----------+-------------------+ POP      Full           Yes      Yes                                      +---------+---------------+---------+-----------+----------+-------------------+ PTV      Full                                         Not well visualized +---------+---------------+---------+-----------+----------+-------------------+ PERO     Full                                         Not well visualized +---------+---------------+---------+-----------+----------+-------------------+   +---------+---------------+---------+-----------+----------+-------------------+ LEFT     CompressibilityPhasicitySpontaneityPropertiesThrombus Aging      +---------+---------------+---------+-----------+----------+-------------------+ CFV      Full           Yes      Yes                                      +---------+---------------+---------+-----------+----------+-------------------+ SFJ      Full                                                             +---------+---------------+---------+-----------+----------+-------------------+ FV Prox  Full           Yes      Yes                                      +---------+---------------+---------+-----------+----------+-------------------+ FV Mid   Full           Yes      Yes                                      +---------+---------------+---------+-----------+----------+-------------------+ FV DistalFull           Yes      Yes                                       +---------+---------------+---------+-----------+----------+-------------------+  PFV      Full                                                             +---------+---------------+---------+-----------+----------+-------------------+ POP      Full                                                             +---------+---------------+---------+-----------+----------+-------------------+ PTV      Full                                         Not well visualized +---------+---------------+---------+-----------+----------+-------------------+ PERO     Full                                         Not well visualized +---------+---------------+---------+-----------+----------+-------------------+     Summary: BILATERAL: - No evidence of deep vein thrombosis seen in the lower extremities, bilaterally. -No evidence of popliteal cyst, bilaterally.   *See table(s) above for measurements and observations. Electronically signed by Jamelle Haring on 07/06/2022 at 11:16:25 AM.    Final    CT HEAD WO CONTRAST (5MM)  Result Date: 07/05/2022 CLINICAL DATA:  Altered mental status EXAM: CT HEAD WITHOUT CONTRAST TECHNIQUE: Contiguous axial images were obtained from the base of the skull through the vertex without intravenous contrast. RADIATION DOSE REDUCTION: This exam was performed according to the departmental dose-optimization program which includes automated exposure control, adjustment of the mA and/or kV according to patient size and/or use of iterative reconstruction technique. COMPARISON:  05/07/2022 FINDINGS: Brain: There is no mass, hemorrhage or extra-axial collection. There is generalized atrophy without lobar predilection. Hypodensity of the white matter is most commonly associated with chronic microvascular disease. Previously seen right subdural hematoma has resolved. Vascular: No abnormal hyperdensity of the major intracranial arteries or dural venous sinuses. No intracranial  atherosclerosis. Skull: The visualized skull base, calvarium and extracranial soft tissues are normal. Sinuses/Orbits: No fluid levels or advanced mucosal thickening of the visualized paranasal sinuses. No mastoid or middle ear effusion. The orbits are normal. IMPRESSION: 1. No acute intracranial abnormality. 2. Generalized atrophy and findings of chronic microvascular disease. 3. Previously seen right subdural hematoma has resolved. Electronically Signed   By: Ulyses Jarred M.D.   On: 07/05/2022 03:56   CT Chest W Contrast  Result Date: 07/04/2022 CLINICAL DATA:  Abnormal chest radiograph EXAM: CT CHEST WITH CONTRAST TECHNIQUE: Multidetector CT imaging of the chest was performed during intravenous contrast administration. RADIATION DOSE REDUCTION: This exam was performed according to the departmental dose-optimization program which includes automated exposure control, adjustment of the mA and/or kV according to patient size and/or use of iterative reconstruction technique. CONTRAST:  45m OMNIPAQUE IOHEXOL 350 MG/ML SOLN COMPARISON:  Chest radiographs dated 07/04/2022 and 04/30/2022 FINDINGS: Cardiovascular: Study is not tailored for evaluation of the pulmonary arteries. However, there are filling defects in the right main pulmonary artery in the lobar left  upper and lower lobe pulmonary arteries (series 3/images 62 and 71), reflecting large volume pulmonary embolism. The heart is top-normal in size. No pericardial effusion. No findings to suggest right heart strain. No evidence of thoracic aortic aneurysm or dissection. Atherosclerotic calcifications of the aortic arch. Mediastinum/Nodes: No suspicious mediastinal lymphadenopathy. Visualized thyroid is unremarkable. Lungs/Pleura: Small right pleural effusion with two foci of loculated fluid along the right major fissure (series 4/image 56), corresponding to the nodular opacities on chest radiograph. Mild linear scarring/atelectasis in the right upper lobe and  left lower lobe. No focal consolidation. No suspicious pulmonary nodules. Mild centrilobular and paraseptal emphysematous changes, upper lung predominant. No pneumothorax. Upper Abdomen: Visualized upper abdomen is notable for scattered renal cysts measuring up to 8.4 cm in the left upper pole (series 3/image 117), benign. Vascular calcifications. Musculoskeletal: Degenerative changes of the visualized thoracolumbar spine. IMPRESSION: Large volume pulmonary embolism in the right main pulmonary artery and lobar left upper and lower lobe pulmonary arteries. No findings to suggest right heart strain. Critical Value/emergent results were called by telephone at the time of interpretation on 07/04/2022 at 11:47 pm to provider JULIE HAVILAND , who verbally acknowledged these results. Small right pleural effusion, partially loculated. Loculated fluid accounts for the radiographic appearance. No suspicious pulmonary nodules. Aortic Atherosclerosis (ICD10-I70.0) and Emphysema (ICD10-J43.9). Electronically Signed   By: Julian Hy M.D.   On: 07/04/2022 23:49   DG Chest Portable 1 View  Result Date: 07/04/2022 CLINICAL DATA:  Altered mental status EXAM: PORTABLE CHEST 1 VIEW COMPARISON:  Chest x-ray 04/30/2022.  Chest CT 01/10/2011 FINDINGS: There are 2 oval densities in the right mid lung with the largest measuring 3.8 x 1.9 cm, indeterminate. The lungs are otherwise clear. Costophrenic angles are clear. There is no pneumothorax. Cardiomediastinal silhouette is within normal limits. There is mild elevation of the left hemidiaphragm. No acute fractures are seen. IMPRESSION: There are 2 oval densities in the right mid lung with the largest measuring 3.8 x 1.9 cm, indeterminate. CT chest is recommended for further evaluation. Electronically Signed   By: Ronney Asters M.D.   On: 07/04/2022 18:19    Assessment and Plan:   Nonsustained VT.  On review of telemetry these are PVC mediated nonsustained runs of VT.  Most  likely this is in the setting of acute illness with both pulmonary embolism and urinary tract infection.  Electrolytes were initially imbalance, however these have been corrected and still having some nonsustained runs.  For family, no major changes on his echo performed today.  His beta-blocker may suppress some of the PVCs and thus prevent these episodes.  I would not subject him to amiodarone for these especially given that he is asymptomatic.  Does not appear to be ischemic or ischemia mediated.  He had 2 sets of high-sensitivity troponin collected that were unremarkable.  No need to further check troponins.  Recommended: - Would increase carvedilol to 6.25 mg twice daily. -At this time no further ischemic evaluation warranted -Do not recommend any other antiarrhythmics at this time, continue on telemetry and could reconsider if he has sustained runs.      For questions or updates, please contact [place] Please consult [email or web address] for contact info under     Signed, Doyne Keel, MD  07/06/2022 7:46 PM

## 2022-07-06 NOTE — Progress Notes (Signed)
  Echocardiogram 2D Echocardiogram has been performed.  [name] 07/06/2022, 12:12 PM

## 2022-07-06 NOTE — Consult Note (Addendum)
[facility] [place]  ASSESSMENT / PLAN: [name] is a 76 y.o. male with atherosclerosis of native arteries of bilateral lower extremities causing no symptoms. He also has chronic venous insufficiency in bilateral lower extremities.  Recommend the following which can slow the progression of atherosclerosis and reduce the risk of major adverse cardiac / limb events:  Complete cessation from all tobacco products. Blood glucose control with goal A1c < 7%. Blood pressure control with goal blood pressure < 140/90 mmHg. Lipid reduction therapy with goal LDL-C <100 mg/dL (<70 if symptomatic from PAD).  Aspirin '81mg'$  PO QD.  Atorvastatin 40-'80mg'$  PO QD (or other "high intensity" statin therapy).  No limb threatening symptoms identified. Recommended compression and elevation for lower extremity swelling. Please call for questions.  CHIEF COMPLAINT: confusion  HISTORY OF PRESENT ILLNESS: [name] is a 76 y.o. male admitted to the internal medicine service for confusion and agitation attributed to urinary tract infection.  He has multiple chronic medical comorbidities.  He was found to have pulmonary embolism.  He had a chronic subdural hematoma which has resolved.  On exam, his feet appeared edematous and cool.  This prompted noninvasive testing and vascular consultation.  On my exam, the patient reports no pain in his feet.  He does not walk far or fast enough to claudicate.  He does ambulate with a rollator.  He is not able to ambulate beyond short distances in the home.  He does not describe symptoms typical of ischemic rest pain.  He has no ulcers about his feet.    Past Medical History:  Diagnosis Date   Adenoma 05/2008   Alcoholism in recovery [facility])    BPH (benign prostatic hyperplasia)    Cancer (HCC)    Prostate   Chronic LBP    Hip & Back -- Sees Dr. Maia Petties @ [place]   CKD (chronic kidney disease)    with AKI requiring brief  CRRT 02/2020   Colon polyps    Complex renal cyst 12/2010   Controlled type 2 diabetes mellitus with neuropathy ([place]) 01/2007   COPD (chronic obstructive pulmonary disease) ([place])    Diabetes ([place])    Diverticulitis of colon    ED (erectile dysfunction)    s/p Penile prosthesis (09/2010)   History of prostate cancer    Dr. Karsten Ro   History of sick sinus syndrome    reduced BB dose for Bradycardia   HLD (hyperlipidemia)    Hypertension    Impaired glucose tolerance 12/21/2013   Nephrolithiasis    Osteoarthritis of both hips    Osteoarthritis of lumbosacral spine    with Disc Disease   PAF (paroxysmal atrial fibrillation) (HCC)    No longer on Amiodarone.  Not on Anticoaguation b/c no recurrence.   Paresthesias/numbness    Bilateral LE   Peripheral neuropathy 12/21/2013   Stroke [facility])    left occipital CVA ~ 2018; possible TIA 01/24/22   Subdural hematoma ([place]) 04/04/2022   Anticoagulation stopped   Venous insufficiency     Past Surgical History:  Procedure Laterality Date   IR ANGIO INTRA EXTRACRAN SEL COM CAROTID INNOMINATE UNI R MOD SED  04/17/2022   LEFT HEART CATH AND CORONARY ANGIOGRAPHY  2003   Normal Coronary Arteries.   NM MYOVIEW LTD  02/21/2020   EF 60%.  Medium sized mild severity defect in the basal inferior, mid inferior and apical inferior location-suggesting of ischemia.  Read as low-intermediate risk.  (On cardiology review this  is felt to be a fixed defect either related to prior infarct versus diaphragmatic attenuation.  Felt to be low risk)   PENILE PROSTHESIS IMPLANT  06/21/2012   Procedure: PENILE PROTHESIS INFLATABLE;  Surgeon: Claybon Jabs, MD;  Location: [facility];  Service: Urology;  Laterality: N/A;  REMOVAL AND REPLACEMENT OF SOME  OF PROSTHESIS ([facility])    PENILE PROSTHESIS PLACEMENT  09/2010   PROSTATECTOMY     RADIOLOGY WITH ANESTHESIA N/A 04/17/2022   Procedure: Right middle meningeal artery embolization;  Surgeon: Consuella Lose, MD;  Location: [facility];  Service: Radiology;  Laterality: N/A;   REMOVAL OF PENILE PROSTHESIS N/A 02/15/2018   Procedure: REMOVAL OF PENILE PROSTHESIS;  Surgeon: Kathie Rhodes, MD;  Location: [facility];  Service: Urology;  Laterality: N/A;   TRANSTHORACIC ECHOCARDIOGRAM  01/2022   EF 70-75%:Marland Kitchen  Hyperdynamic LV with no RWMA.  Mild LVH.  Severely dilated left atrium suggesting notable diastolic dysfunction, but unable to interpret.  Mildly enlarged RV with normal function.  Normal PAP despite moderate dilated RA and increased RAP/CVP.Marland Kitchen  No MS or MR.  No AAS or AI.  Aortic root measuring 42 mm with ascending aorta measuring 41 mm.   TRANSTHORACIC ECHOCARDIOGRAM  02/17/2020   EF 60 to 65%.  No R WMA.  Unable to assess diastolic parameters because of A. fib.  Mildly elevated PA pressures.  Mild LA dilation.  Mild aortic valve sclerosis but no stenosis.  Normal IVC.    Family History  Problem Relation Age of Onset   Coronary artery disease Mother    Heart attack Mother    Coronary artery disease Father    Prostate cancer Father    Diabetes Father     Social History   Socioeconomic History   Marital status: Single    Spouse name: Not on file   Number of children: 3   Years of education: Not on file   Highest education level: Not on file  Occupational History   Occupation: Retired  Tobacco Use   Smoking status: Former    Types: Cigarettes    Quit date: 11/04/1982    Years since quitting: 39.6   Smokeless tobacco: Never  Vaping Use   Vaping Use: Never used  Substance and Sexual Activity   Alcohol use: Not Currently   Drug use: No    Types: Marijuana    Comment: use to smoke marijuana   Sexual activity: Not Currently  Other Topics Concern   Not on file  Social History Narrative   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   Divorced. 3 grown children. 4 GC.     Quit smoking ~20+ yrs ago.    Social Determinants of Health   Financial  Resource Strain: Not on file  Food Insecurity: No Food Insecurity (07/05/2022)   Hunger Vital Sign    Worried About Running Out of Food in the Last Year: Never true    Ran Out of Food in the Last Year: Never true  Transportation Needs: No Transportation Needs (07/05/2022)   PRAPARE - Hydrologist (Medical): No    Lack of Transportation (Non-Medical): No  Physical Activity: Not on file  Stress: Not on file  Social Connections: Not on file  Intimate Partner Violence: Not At Risk (07/05/2022)   Humiliation, Afraid, Rape, and Kick questionnaire    Fear of Current or Ex-Partner: No    Emotionally Abused: No  Physically Abused: No    Sexually Abused: No    Allergies  Allergen Reactions   Ciprofloxacin Other (See Comments)    All over weakness    Current Facility-Administered Medications  Medication Dose Route Frequency Provider Last Rate Last Admin   acetaminophen (TYLENOL) tablet 650 mg  650 mg Oral Q6H PRN Lequita Halt, MD       Or   acetaminophen (TYLENOL) suppository 650 mg  650 mg Rectal Q6H PRN Wynetta Fines T, MD       albuterol (PROVENTIL) (2.5 MG/3ML) 0.083% nebulizer solution 2.5 mg  2.5 mg Inhalation Q6H PRN Wynetta Fines T, MD       carvedilol (COREG) tablet 3.125 mg  3.125 mg Oral BID WC Wynetta Fines T, MD   3.125 mg at 07/06/22 0908   cefTRIAXone (ROCEPHIN) 2 g in sodium chloride 0.9 % 100 mL IVPB  2 g Intravenous Q24H Wynetta Fines T, MD 200 mL/hr at 07/05/22 2113 2 g at 07/05/22 2113   heparin ADULT infusion 100 units/mL (25000 units/290m)  1,300 Units/hr Intravenous Continuous Regalado, Belkys A, MD       ondansetron (ZOFRAN) tablet 4 mg  4 mg Oral Q6H PRN ZLequita Halt MD       Or   ondansetron ([facility] injection 4 mg  4 mg Intravenous Q6H PRN ZLequita Halt MD       Oral care mouth rinse  15 mL Mouth Rinse PRN ZWynetta FinesT, MD       potassium chloride 10 mEq in 100 mL IVPB  10 mEq Intravenous Q1 Hr x 3 Regalado, Belkys A, MD 100 mL/hr at  07/06/22 0919 10 mEq at 07/06/22 0919   rosuvastatin (CRESTOR) tablet 20 mg  20 mg Oral QPM ZWynetta FinesT, MD   20 mg at 07/05/22 1655   tamsulosin (FLOMAX) capsule 0.4 mg  0.4 mg Oral QPC supper ZWynetta FinesT, MD   0.4 mg at 07/05/22 1655    PHYSICAL EXAM Vitals:   07/05/22 0844 07/05/22 1655 07/06/22 0441 07/06/22 0853  BP: 126/70 127/74 132/73 115/66  Pulse: 63 62 69 (!) 53  Resp: '17 17 18 17  '$ Temp: 97.9 F (36.6 C) 97.7 F (36.5 C) 97.6 F (36.4 C) 97.8 F (36.6 C)  TempSrc: Oral Oral Oral   SpO2: 100% 99%  96%  Weight:      Height:       Chronically ill elderly man in no acute distress Regular rate and rhythm Unlabored breathing Weakly palpable left dorsalis pedis pulse No palpable right pedal pulses.  The right foot is warm and well-perfused Edema right worse than left pitting involving the ankles and calves.  Evidence of chronic venous insufficiency skin changes.   PERTINENT LABORATORY AND RADIOLOGIC DATA  Most recent CBC    Latest Ref Rng & Units 07/06/2022    1:50 AM 07/04/2022   11:49 AM 04/30/2022    4:25 PM  CBC  WBC 4.0 - 10.5 K/uL 6.1  6.3  7.4   Hemoglobin 13.0 - 17.0 g/dL 12.0  14.3  13.9   Hematocrit 39.0 - 52.0 % 36.4  45.7  42.5   Platelets 150 - 400 K/uL 190  258  166.0      Most recent CMP    Latest Ref Rng & Units 07/06/2022    1:50 AM 07/05/2022    9:26 PM 07/05/2022    2:16 AM  CMP  Glucose 70 - 99 mg/dL 102  111  105   BUN 8 - 23 mg/dL '13  14  12   '$ Creatinine 0.61 - 1.24 mg/dL 1.46  1.58  1.51   Sodium 135 - 145 mmol/L 137  139  139   Potassium 3.5 - 5.1 mmol/L 2.9  3.5  3.2   Chloride 98 - 111 mmol/L 103  104  103   CO2 22 - 32 mmol/L '25  27  23   '$ Calcium 8.9 - 10.3 mg/dL 7.9  8.0  8.3     Renal function Estimated Creatinine Clearance: 55.2 mL/min (A) (by C-G formula based on SCr of 1.46 mg/dL (H)).  Hgb A1c MFr Bld (%)  Date Value  03/10/2022 7.0 (H)    LDL Chol Calc (NIH)  Date Value Ref Range Status  08/23/2020 88 0 -  99 mg/dL Final   LDL Cholesterol  Date Value Ref Range Status  03/10/2022 57 0 - 99 mg/dL Final     +---------+------------------+-----+----------+--------+ Right    Rt Pressure (mmHg)IndexWaveform  Comment  +---------+------------------+-----+----------+--------+ PTA      87                0.70 monophasic         +---------+------------------+-----+----------+--------+ DP       83                0.67 monophasic         +---------+------------------+-----+----------+--------+ Great Toe                       Absent             +---------+------------------+-----+----------+--------+  +---------+------------------+-----+----------+-------+ Left     Lt Pressure (mmHg)IndexWaveform  Comment +---------+------------------+-----+----------+-------+ Brachial 124                                      +---------+------------------+-----+----------+-------+ PTA      99                0.80 monophasic        +---------+------------------+-----+----------+-------+ DP       120               0.97 monophasic        +---------+------------------+-----+----------+-------+ Great Toe                       Absent            +---------+------------------+-----+----------+-------+  Bilateral lower extremity venous duplex - No evidence of deep vein thrombosis seen in the lower extremities,  bilaterally.  -No evidence of popliteal cyst, bilaterally.    Yevonne Aline. Stanford Breed, MD FACS [facility] Phone Number: [phone] 07/06/2022 10:28 AM   Total time spent on preparing this encounter including chart review, data review, collecting history, examining the patient, coordinating care for this new patient, 60 minutes.  Portions of this report may have been transcribed using voice recognition software.  Every effort has been made to ensure accuracy; however, inadvertent computerized transcription errors may still  be present.

## 2022-07-06 NOTE — Progress Notes (Signed)
Central Telemetry call and inform this nurse that pt is running 15 beats of Vtach. Pt is asystemic, this is not different than previous reported. MD is aware.

## 2022-07-06 NOTE — Progress Notes (Signed)
ANTICOAGULATION CONSULT NOTE - Follow Up Consult  Pharmacy Consult for Heparin Indication: pulmonary embolus  Allergies  Allergen Reactions   Ciprofloxacin Other (See Comments)    All over weakness    Patient Measurements: Height: '6\' 1"'$  (185.4 cm) Weight: 103.1 kg (227 lb 4.7 oz) IBW/kg (Calculated) : 79.9 Heparin Dosing Weight: 100.8 kg   Vital Signs: Temp: 97.8 F (36.6 C) (12/17 0853) Temp Source: Oral (12/17 0441) BP: 115/66 (12/17 0853) Pulse Rate: 53 (12/17 0853)  Labs: Recent Labs    07/04/22 1149 07/05/22 0216 07/05/22 1305 07/05/22 2126 07/06/22 0150 07/06/22 0803  HGB 14.3  --   --   --  12.0*  --   HCT 45.7  --   --   --  36.4*  --   PLT 258  --   --   --  190  --   HEPARINUNFRC  --   --    < > 0.66 0.70 1.09*  CREATININE 1.41* 1.51*  --  1.58* 1.46*  --    < > = values in this interval not displayed.    Estimated Creatinine Clearance: 55.2 mL/min (A) (by C-G formula based on SCr of 1.46 mg/dL (H)).   Medications:  Scheduled:   carvedilol  3.125 mg Oral BID WC   potassium chloride  40 mEq Oral Once   rosuvastatin  20 mg Oral QPM   tamsulosin  0.4 mg Oral QPC supper   Infusions:   cefTRIAXone (ROCEPHIN)  IV 2 g (07/05/22 2113)   heparin 1,500 Units/hr (07/05/22 1944)   potassium chloride      Assessment: 76 y/o M here with UTI, found to have new onset pulmonary embolus on CT, had a SDH a few months ago, repeat CT head on 12/16 with no issues. Baseline CBC ok. Pharmacy consulted for heparin dosing.    Heparin level overnight was therapeutic at 0.70, and now supratherapeutic at 1.09 this morning, on 1500 units/hr. Hgb 12, plt 190. No line issues or signs/symptoms of bleeding noted per RN.  Goal of Therapy:  Heparin level 0.3-0.7 units/ml Monitor platelets by anticoagulation protocol: Yes   Plan:  Hold heparin for ~1 hour, then decrease heparin infusion rate to 1300 units/hr. Check ~8 hr heparin level.  Daily CBC, heparin level. Monitor for  signs/symptoms of bleeding.   Vance Peper, PharmD PGY-2 Pharmacy Resident Phone [phone] 07/06/2022 9:13 AM   Please check AMION for all [place] phone numbers After 10:00 PM, call [place] [phone]

## 2022-07-06 NOTE — Progress Notes (Signed)
[place] for Heparin Indication: pulmonary embolus  Allergies  Allergen Reactions   Ciprofloxacin Other (See Comments)    All over weakness   Patient Measurements: Height: '6\' 1"'$  (185.4 cm) Weight: 103.1 kg (227 lb 4.7 oz) IBW/kg (Calculated) : 79.9  Heparin Dosing Weight: 101 kg   Vital Signs: Temp: 98.3 F (36.8 C) (12/17 1600) Temp Source: Oral (12/17 1600) BP: 120/80 (12/17 1600) Pulse Rate: 58 (12/17 1600)  Labs: Recent Labs    07/04/22 1149 07/05/22 0216 07/05/22 2126 07/06/22 0150 07/06/22 0803 07/06/22 1543 07/06/22 1825  HGB 14.3  --   --  12.0*  --   --   --   HCT 45.7  --   --  36.4*  --   --   --   PLT 258  --   --  190  --   --   --   HEPARINUNFRC  --    < > 0.66 0.70 1.09*  --  0.75*  CREATININE 1.41*   < > 1.58* 1.46*  --  1.59*  --    < > = values in this interval not displayed.   Estimated Creatinine Clearance: 50.6 mL/min (A) (by C-G formula based on SCr of 1.59 mg/dL (H)).  Assessment: 76 y/o M here with UTI, found to have new onset pulmonary embolus on CT, had a SDH a few months ago, repeat CT head on 12/16 with no issues. Baseline CBC ok. Pharmacy consulted for heparin dosing.    Hgb 12, plt 190. Heparin level supratherapeutic 1.09 this morning on 1500 units/hr. Heparin reduced to 1300 units/hr, repeat level still elevated at 0.75. Per RN, no issues with heparin infusion but she did note some bleeding from the other PIV site that has been stopped with pressure. RN will check on it during next shift and report any worsening of bleed.   Goal of Therapy:  Heparin level 0.3-0.7 units/ml Monitor platelets by anticoagulation protocol: Yes   Plan:  Decrease heparin infusion to 1100 units/hr Check heparin level in 8 hours and daily while on heparin Continue to monitor H&H and platelets    Thank you for allowing pharmacy to be a part of this patient's care.  Ardyth Harps, PharmD Clinical Pharmacist

## 2022-07-06 NOTE — Progress Notes (Addendum)
PROGRESS NOTE    [name]  MEB:[id number] DOB: 1946/02/11 DOA: 07/04/2022 PCP: Biagio Borg, MD   Brief Narrative: 76 year old with past medical history significant for PAF not on anticoagulation secondary to frequent fall, subdural hematoma secondary to fall 04/20/2022, chronic ambulation dysfunction uses roller walker, COPD, prostate cancer status post prostatectomy, BPH, COPD, chronic heart failure preserved ejection fraction, CKD stage II, CVA brought by family due to worsening confusion and evaluation for urinary symptoms.  Patient presented with generalized weakness, confusion and agitation for the last 2 weeks.  He has become more unsteady on his feet.  Recent diagnosed with E. coli UTI on December 6 and started on ampicillin.  Patient has not been able to start taking antibiotics due to decreased oral intake and confusion.  Family noticed blood in the urine.  CT chest was done to evaluate pulmonary nodule, he was found to have Large volume pulmonary embolism in the right main pulmonary artery and lobar left upper and lower lobe pulmonary arteries. No findings to suggest right heart strain.  Repeated CT head showed resolution of prior subdural hematoma.  Dr. Claria Dice discussed with neurosurgery on-call and okay to proceed with heparin drip.    Assessment & Plan:   Principal Problem:   UTI (urinary tract infection) Active Problems:   COPD (chronic obstructive pulmonary disease) (HCC)   Benign essential tremor   Chronic kidney disease, stage 3b (HCC)   Acute lower UTI   Bilateral pulmonary embolism (HCC)   1-Bilateral PE, acute hypoxic respiratory failure oxygen saturation on room air 86%. CT chest : Large volume pulmonary embolism in the right main pulmonary artery and lobar left upper and lower lobe pulmonary arteries. Continue with heparin gtt for 48 hours to make sure no new focal deficit.  ECHO order, Doppler : negative for DVT 98 % Saturation is stable on 2 L of  oxygen  2-Subdural Hematoma;  -Recent diagnosed with subdural hematoma due to fall on 9/15. -Progression of subdural hematoma, and Headaches 9/28; admitted to the hospital, he underwent diagnostic cerebral angiogram and attempt of right MMA embolization but procedure was aborted due to likelihood of increased risk of a stroke and other significant morbidity. -CT head done 10/18: Interval decrease in size of right cerebral convexity subdural hematoma, now measuring up to 5 mm, previously 10 mm.  -CT head 12/16: No acute intracranial abnormalities.  Case discussed with neurosurgeon on call, ok to start heparin.  No new deficit.    3-Acute Metabolic Encephalopathy;  In setting of UTI.  Delirium precaution.    4-Incidental lungs nodule, CT chest showed PE.  Small right pleural effusion, partially loculated. Loculated fluid accounts for the radiographic appearance. No suspicious pulmonary nodules.   5-UTI, hematuria;  UA with more than 50 WBC,  Urine culture: 50 K Proteous Penneri Continue with IV ceftriaxone.  Hematuria resolved.   6-Hypokalemia; Replete orally and IV>   7- Non sustain VT: K low this am, replete orally and IV. Repeat Bmet this afternoon. Mg 1.9 improved. Give another dose of IV mg. ECHO pending.  Asymptomatic.  On Coreg.  Addendum;  Another episode of VT asymptomatic. He has received potassium and mg supplement. Will give another 20 meq of Potasium.  Check troponin. Cardiology consulted.   8-Cold extremities; PVD;  Daughter notice worsening cold feet. After removing stocking compression, cold extremities  improved. Patient denies feet pain.  ABI: Resting right ankle-brachial index indicates moderate right lower  extremity arterial disease. The right toe-brachial index is absent.  Left: The left toe-brachial index is absent.  Although ankle brachial indices are within normal limits (0.95-1.29), arterial Doppler waveforms at the ankle suggest some component of arterial   occlusive disease.  Appreciate Vascular evaluation: recommend LDL less than 100, A1c less 7, aspirin and atorvastatin.   9-Acute on chronic heart failure preserved ejection fraction: He has significant lower extremity edema.  Will hold Lasix due to increased creatinine.  He might need albumin and Lasix tomorrow. Cold extremity.  Will check echo  10-Chronic A-fib Continue with Coreg. BPH; Started on flomax.  Needs follow up with urology  PVR   CKD stage II;  1.3--1.5 Monitor  Estimated body mass index is 29.99 kg/m as calculated from the following:   Height as of this encounter: '6\' 1"'$  (1.854 m).   Weight as of this encounter: 103.1 kg.   DVT prophylaxis: Heparin Code Status: Full code Family Communication: Discussed with daughter 12/16 Disposition Plan:  Status is: Observation The patient will require care spanning > 2 midnights and should be moved to inpatient because: Management of PE and respiratory failure    Consultants:  None  Procedures:  Echo Doppler ABI  Antimicrobials:    Subjective: He is breathing ok, arm pain , he is getting K  Denies LE pain.    Objective: Vitals:   07/05/22 0622 07/05/22 0844 07/05/22 1655 07/06/22 0441  BP: 139/85 126/70 127/74 132/73  Pulse: 80 63 62 69  Resp: '17 17 17 18  '$ Temp: 98.2 F (36.8 C) 97.9 F (36.6 C) 97.7 F (36.5 C) 97.6 F (36.4 C)  TempSrc: Oral Oral Oral Oral  SpO2: 100% 100% 99%   Weight:      Height:       No intake or output data in the 24 hours ending 07/06/22 0705  Filed Weights   07/05/22 0120  Weight: 103.1 kg    Examination:  General exam: NAD Respiratory system: CTA Cardiovascular system: S 1, S 2 RRR Gastrointestinal system: BS present, soft, nt Central nervous system: Alert, moves all 4 extremities Extremities: plus 2 edema    Data Reviewed: I have personally reviewed following labs and imaging studies  CBC: Recent Labs  Lab 07/04/22 1149 07/06/22 0150  WBC 6.3 6.1   NEUTROABS 4.4  --   HGB 14.3 12.0*  HCT 45.7 36.4*  MCV 84.3 82.0  PLT 258 867    Basic Metabolic Panel: Recent Labs  Lab 07/04/22 1149 07/05/22 0216 07/05/22 2126 07/06/22 0150  NA 140 139 139 137  K 3.3* 3.2* 3.5 2.9*  CL 104 103 104 103  CO2 '26 23 27 25  '$ GLUCOSE 114* 105* 111* 102*  BUN '11 12 14 13  '$ CREATININE 1.41* 1.51* 1.58* 1.46*  CALCIUM 8.5* 8.3* 8.0* 7.9*  MG 1.4*  --  1.4* 1.9  PHOS  --   --   --  2.8    GFR: Estimated Creatinine Clearance: 55.2 mL/min (A) (by C-G formula based on SCr of 1.46 mg/dL (H)). Liver Function Tests: Recent Labs  Lab 07/04/22 1149  AST 26  ALT 13  ALKPHOS 132*  BILITOT 0.6  PROT 7.6  ALBUMIN 2.4*    No results for input(s): "LIPASE", "AMYLASE" in the last 168 hours. No results for input(s): "AMMONIA" in the last 168 hours. Coagulation Profile: No results for input(s): "INR", "PROTIME" in the last 168 hours. Cardiac Enzymes: No results for input(s): "CKTOTAL", "CKMB", "CKMBINDEX", "TROPONINI" in the last 168 hours. BNP (last 3 results) No results for input(s): "  PROBNP" in the last 8760 hours. HbA1C: No results for input(s): "HGBA1C" in the last 72 hours. CBG: No results for input(s): "GLUCAP" in the last 168 hours. Lipid Profile: No results for input(s): "CHOL", "HDL", "LDLCALC", "TRIG", "CHOLHDL", "LDLDIRECT" in the last 72 hours. Thyroid Function Tests: No results for input(s): "TSH", "T4TOTAL", "FREET4", "T3FREE", "THYROIDAB" in the last 72 hours. Anemia Panel: No results for input(s): "VITAMINB12", "FOLATE", "FERRITIN", "TIBC", "IRON", "RETICCTPCT" in the last 72 hours. Sepsis Labs: Recent Labs  Lab 07/04/22 1758 07/05/22 0216  LATICACIDVEN 2.2* 2.8*     Recent Results (from the past 240 hour(s))  Culture, blood (routine x 2)     Status: None (Preliminary result)   Collection Time: 07/04/22  5:58 PM   Specimen: BLOOD  Result Value Ref Range Status   Specimen Description BLOOD RIGHT ANTECUBITAL  Final    Special Requests   Final    BOTTLES DRAWN AEROBIC AND ANAEROBIC Blood Culture adequate volume   Culture   Final    NO GROWTH < 24 HOURS Performed at [facility], [address] [place], [place] [address]    Report Status PENDING  Incomplete  Urine Culture     Status: Abnormal (Preliminary result)   Collection Time: 07/04/22  9:38 PM   Specimen: Urine, Clean Catch  Result Value Ref Range Status   Specimen Description URINE, CLEAN CATCH  Final   Special Requests NONE  Final   Culture (A)  Final    50,000 COLONIES/mL GRAM NEGATIVE RODS CULTURE REINCUBATED FOR BETTER GROWTH Performed at [facility], [place] [address] [place], [place] [address]    Report Status PENDING  Incomplete         Radiology Studies: VAS [place] ABI WITH/WO TBI  Result Date: 07/05/2022  LOWER EXTREMITY DOPPLER STUDY Patient Name:  ZUBIN PONTILLO  Date of Exam:   07/05/2022 Medical Rec #: [id number]            Accession #:    [id number] Date of Birth: 02-Oct-1945            Patient Gender: M Patient Age:   2 years Exam Location:  [facility] Procedure:      VAS [place] ABI WITH/WO TBI Referring Phys: Jerald Kief Hadasah Brugger --------------------------------------------------------------------------------  Indications: Peripheral artery disease. High Risk Factors: Hypertension, hyperlipidemia, Diabetes, prior CVA. Other Factors: CKD, CHF, Afib,.  Limitations: Today's exam was limited due to edema and thickened skin. Comparison Study: No previous exams Performing Technologist: Rogelia Rohrer RVT/RDMS  Examination Guidelines: A complete evaluation includes at minimum, Doppler waveform signals and systolic blood pressure reading at the level of bilateral brachial, anterior tibial, and posterior tibial arteries, when vessel segments are accessible. Bilateral testing is considered an integral part of a complete examination. Photoelectric Plethysmograph (PPG) waveforms and toe systolic pressure readings are included as  required and additional duplex testing as needed. Limited examinations for reoccurring indications may be performed as noted.  ABI Findings: +---------+------------------+-----+----------+--------+ Right    Rt Pressure (mmHg)IndexWaveform  Comment  +---------+------------------+-----+----------+--------+ PTA      87                0.70 monophasic         +---------+------------------+-----+----------+--------+ DP       83                0.67 monophasic         +---------+------------------+-----+----------+--------+ Great Toe  Absent             +---------+------------------+-----+----------+--------+ +---------+------------------+-----+----------+-------+ Left     Lt Pressure (mmHg)IndexWaveform  Comment +---------+------------------+-----+----------+-------+ Brachial 124                                      +---------+------------------+-----+----------+-------+ PTA      99                0.80 monophasic        +---------+------------------+-----+----------+-------+ DP       120               0.97 monophasic        +---------+------------------+-----+----------+-------+ Great Toe                       Absent            +---------+------------------+-----+----------+-------+  Summary: Right: Resting right ankle-brachial index indicates moderate right lower extremity arterial disease. The right toe-brachial index is absent. Left: The left toe-brachial index is absent. Although ankle brachial indices are within normal limits (0.95-1.29), arterial Doppler waveforms at the ankle suggest some component of arterial occlusive disease. *See table(s) above for measurements and observations.     Preliminary    VAS [place] LOWER EXTREMITY VENOUS (DVT)  Result Date: 07/05/2022  Lower Venous DVT Study Patient Name:  TARRELL DEBES  Date of Exam:   07/05/2022 Medical Rec #: [id number]            Accession #:    [id number] Date of Birth: 04-04-1946             Patient Gender: M Patient Age:   3 years Exam Location:  [facility] Procedure:      VAS [place] LOWER EXTREMITY VENOUS (DVT) Referring Phys: Wynetta Fines --------------------------------------------------------------------------------  Indications: Edema.  Risk Factors: Chronic venous insufficiency & edema. Limitations: Body habitus and poor ultrasound/tissue interface. Comparison Study: Previous exam on 10/24/20 was negative for DVT Performing Technologist: Rogelia Rohrer RVT, RDMS  Examination Guidelines: A complete evaluation includes B-mode imaging, spectral Doppler, color Doppler, and power Doppler as needed of all accessible portions of each vessel. Bilateral testing is considered an integral part of a complete examination. Limited examinations for reoccurring indications may be performed as noted. The reflux portion of the exam is performed with the patient in reverse Trendelenburg.  +---------+---------------+---------+-----------+----------+-------------------+ RIGHT    CompressibilityPhasicitySpontaneityPropertiesThrombus Aging      +---------+---------------+---------+-----------+----------+-------------------+ CFV      Full           Yes      Yes                                      +---------+---------------+---------+-----------+----------+-------------------+ SFJ      Full                                                             +---------+---------------+---------+-----------+----------+-------------------+ FV Prox  Full           Yes      Yes                                      +---------+---------------+---------+-----------+----------+-------------------+  FV Mid   Full           Yes      Yes                                      +---------+---------------+---------+-----------+----------+-------------------+ FV DistalFull           Yes      Yes                                       +---------+---------------+---------+-----------+----------+-------------------+ PFV      Full                                                             +---------+---------------+---------+-----------+----------+-------------------+ POP      Full           Yes      Yes                                      +---------+---------------+---------+-----------+----------+-------------------+ PTV      Full                                         Not well visualized +---------+---------------+---------+-----------+----------+-------------------+ PERO     Full                                         Not well visualized +---------+---------------+---------+-----------+----------+-------------------+   +---------+---------------+---------+-----------+----------+-------------------+ LEFT     CompressibilityPhasicitySpontaneityPropertiesThrombus Aging      +---------+---------------+---------+-----------+----------+-------------------+ CFV      Full           Yes      Yes                                      +---------+---------------+---------+-----------+----------+-------------------+ SFJ      Full                                                             +---------+---------------+---------+-----------+----------+-------------------+ FV Prox  Full           Yes      Yes                                      +---------+---------------+---------+-----------+----------+-------------------+ FV Mid   Full           Yes      Yes                                      +---------+---------------+---------+-----------+----------+-------------------+  FV DistalFull           Yes      Yes                                      +---------+---------------+---------+-----------+----------+-------------------+ PFV      Full                                                             +---------+---------------+---------+-----------+----------+-------------------+  POP      Full                                                             +---------+---------------+---------+-----------+----------+-------------------+ PTV      Full                                         Not well visualized +---------+---------------+---------+-----------+----------+-------------------+ PERO     Full                                         Not well visualized +---------+---------------+---------+-----------+----------+-------------------+     Summary: BILATERAL: - No evidence of deep vein thrombosis seen in the lower extremities, bilaterally. -No evidence of popliteal cyst, bilaterally.   *See table(s) above for measurements and observations.    Preliminary    CT HEAD WO CONTRAST (5MM)  Result Date: 07/05/2022 CLINICAL DATA:  Altered mental status EXAM: CT HEAD WITHOUT CONTRAST TECHNIQUE: Contiguous axial images were obtained from the base of the skull through the vertex without intravenous contrast. RADIATION DOSE REDUCTION: This exam was performed according to the departmental dose-optimization program which includes automated exposure control, adjustment of the mA and/or kV according to patient size and/or use of iterative reconstruction technique. COMPARISON:  05/07/2022 FINDINGS: Brain: There is no mass, hemorrhage or extra-axial collection. There is generalized atrophy without lobar predilection. Hypodensity of the white matter is most commonly associated with chronic microvascular disease. Previously seen right subdural hematoma has resolved. Vascular: No abnormal hyperdensity of the major intracranial arteries or dural venous sinuses. No intracranial atherosclerosis. Skull: The visualized skull base, calvarium and extracranial soft tissues are normal. Sinuses/Orbits: No fluid levels or advanced mucosal thickening of the visualized paranasal sinuses. No mastoid or middle ear effusion. The orbits are normal. IMPRESSION: 1. No acute intracranial abnormality. 2.  Generalized atrophy and findings of chronic microvascular disease. 3. Previously seen right subdural hematoma has resolved. Electronically Signed   By: Ulyses Jarred M.D.   On: 07/05/2022 03:56   CT Chest W Contrast  Result Date: 07/04/2022 CLINICAL DATA:  Abnormal chest radiograph EXAM: CT CHEST WITH CONTRAST TECHNIQUE: Multidetector CT imaging of the chest was performed during intravenous contrast administration. RADIATION DOSE REDUCTION: This exam was performed according to the departmental dose-optimization program which includes automated exposure control, adjustment of the mA and/or kV according to patient size and/or  use of iterative reconstruction technique. CONTRAST:  75m OMNIPAQUE IOHEXOL 350 MG/ML SOLN COMPARISON:  Chest radiographs dated 07/04/2022 and 04/30/2022 FINDINGS: Cardiovascular: Study is not tailored for evaluation of the pulmonary arteries. However, there are filling defects in the right main pulmonary artery in the lobar left upper and lower lobe pulmonary arteries (series 3/images 62 and 71), reflecting large volume pulmonary embolism. The heart is top-normal in size. No pericardial effusion. No findings to suggest right heart strain. No evidence of thoracic aortic aneurysm or dissection. Atherosclerotic calcifications of the aortic arch. Mediastinum/Nodes: No suspicious mediastinal lymphadenopathy. Visualized thyroid is unremarkable. Lungs/Pleura: Small right pleural effusion with two foci of loculated fluid along the right major fissure (series 4/image 56), corresponding to the nodular opacities on chest radiograph. Mild linear scarring/atelectasis in the right upper lobe and left lower lobe. No focal consolidation. No suspicious pulmonary nodules. Mild centrilobular and paraseptal emphysematous changes, upper lung predominant. No pneumothorax. Upper Abdomen: Visualized upper abdomen is notable for scattered renal cysts measuring up to 8.4 cm in the left upper pole (series 3/image  117), benign. Vascular calcifications. Musculoskeletal: Degenerative changes of the visualized thoracolumbar spine. IMPRESSION: Large volume pulmonary embolism in the right main pulmonary artery and lobar left upper and lower lobe pulmonary arteries. No findings to suggest right heart strain. Critical Value/emergent results were called by telephone at the time of interpretation on 07/04/2022 at 11:47 pm to provider JULIE HAVILAND , who verbally acknowledged these results. Small right pleural effusion, partially loculated. Loculated fluid accounts for the radiographic appearance. No suspicious pulmonary nodules. Aortic Atherosclerosis (ICD10-I70.0) and Emphysema (ICD10-J43.9). Electronically Signed   By: SJulian HyM.D.   On: 07/04/2022 23:49   DG Chest Portable 1 View  Result Date: 07/04/2022 CLINICAL DATA:  Altered mental status EXAM: PORTABLE CHEST 1 VIEW COMPARISON:  Chest x-ray 04/30/2022.  Chest CT 01/10/2011 FINDINGS: There are 2 oval densities in the right mid lung with the largest measuring 3.8 x 1.9 cm, indeterminate. The lungs are otherwise clear. Costophrenic angles are clear. There is no pneumothorax. Cardiomediastinal silhouette is within normal limits. There is mild elevation of the left hemidiaphragm. No acute fractures are seen. IMPRESSION: There are 2 oval densities in the right mid lung with the largest measuring 3.8 x 1.9 cm, indeterminate. CT chest is recommended for further evaluation. Electronically Signed   By: ARonney AstersM.D.   On: 07/04/2022 18:19        Scheduled Meds:  carvedilol  3.125 mg Oral BID WC   potassium chloride  40 mEq Oral Once   rosuvastatin  20 mg Oral QPM   tamsulosin  0.4 mg Oral QPC supper   Continuous Infusions:  cefTRIAXone (ROCEPHIN)  IV 2 g (07/05/22 2113)   heparin 1,500 Units/hr (07/05/22 1944)   potassium chloride       LOS: 1 day    Time spent: 35 minutes    Ronnetta Currington A Marquin Patino, MD [facility]   If 7PM-7AM, please  contact night-coverage [email or web address]  07/06/2022, 7:05 AM

## 2022-07-07 ENCOUNTER — Other Ambulatory Visit (HOSPITAL_COMMUNITY): Payer: Self-pay

## 2022-07-07 DIAGNOSIS — G9341 Metabolic encephalopathy: Secondary | ICD-10-CM | POA: Diagnosis not present

## 2022-07-07 DIAGNOSIS — I4729 Other ventricular tachycardia: Secondary | ICD-10-CM

## 2022-07-07 LAB — CBC
HCT: 45.1 % (ref 39.0–52.0)
Hemoglobin: 13.7 g/dL (ref 13.0–17.0)
MCH: 26.2 pg (ref 26.0–34.0)
MCHC: 30.4 g/dL (ref 30.0–36.0)
MCV: 86.2 fL (ref 80.0–100.0)
Platelets: 150 10*3/uL (ref 150–400)
RBC: 5.23 MIL/uL (ref 4.22–5.81)
RDW: 14.7 % (ref 11.5–15.5)
WBC: 4 10*3/uL (ref 4.0–10.5)
nRBC: 0 % (ref 0.0–0.2)

## 2022-07-07 LAB — BASIC METABOLIC PANEL
Anion gap: 10 (ref 5–15)
BUN: 12 mg/dL (ref 8–23)
CO2: 25 mmol/L (ref 22–32)
Calcium: 8.3 mg/dL — ABNORMAL LOW (ref 8.9–10.3)
Chloride: 103 mmol/L (ref 98–111)
Creatinine, Ser: 1.44 mg/dL — ABNORMAL HIGH (ref 0.61–1.24)
GFR, Estimated: 51 mL/min — ABNORMAL LOW (ref >60)
Glucose, Bld: 112 mg/dL — ABNORMAL HIGH (ref 70–99)
Potassium: 4.3 mmol/L (ref 3.5–5.1)
Sodium: 138 mmol/L (ref 135–145)

## 2022-07-07 MED ORDER — APIXABAN 5 MG PO TABS: 5.0000 mg | ORAL_TABLET | Freq: Two times a day (BID) | ORAL | Status: DC

## 2022-07-07 MED ORDER — FUROSEMIDE 40 MG PO TABS
40.0000 mg | ORAL_TABLET | Freq: Every day | ORAL | Status: DC
  Administered 2022-07-07 – 2022-07-08 (×2): 40 mg via ORAL
  Filled 2022-07-07 (×2): qty 1

## 2022-07-07 MED ORDER — POTASSIUM CHLORIDE CRYS ER 20 MEQ PO TBCR
20.0000 meq | EXTENDED_RELEASE_TABLET | Freq: Every day | ORAL | Status: DC
  Administered 2022-07-07 – 2022-07-08 (×2): 20 meq via ORAL
  Filled 2022-07-07 (×2): qty 1

## 2022-07-07 MED ORDER — APIXABAN 5 MG PO TABS
10.0000 mg | ORAL_TABLET | Freq: Two times a day (BID) | ORAL | Status: DC
  Administered 2022-07-07 – 2022-07-08 (×3): 10 mg via ORAL
  Filled 2022-07-07 (×3): qty 2

## 2022-07-07 MED ORDER — CARVEDILOL 6.25 MG PO TABS
6.2500 mg | ORAL_TABLET | Freq: Two times a day (BID) | ORAL | Status: DC
  Administered 2022-07-07 – 2022-07-08 (×3): 6.25 mg via ORAL
  Filled 2022-07-07 (×3): qty 1

## 2022-07-07 NOTE — Progress Notes (Signed)
Pt refusing labs, phlebotomy could not find a vein and next thing to do is a finger stick and he is refusing. MD sent a message to be made aware.

## 2022-07-07 NOTE — Discharge Instructions (Addendum)
Information on my medicine - ELIQUIS (apixaban)  Why was Eliquis prescribed for you? Eliquis was prescribed to treat blood clots that may have been found in the veins of your legs (deep vein thrombosis) or in your lungs (pulmonary embolism) and to reduce the risk of them occurring again.  What do You need to know about Eliquis ? The starting dose is 10 mg (two 5 mg tablets) taken TWICE daily for the FIRST SEVEN (7) DAYS, then on Monday July 14, 2022 (07/14/2022) the dose is reduced to ONE 5 mg tablet taken TWICE daily.  Eliquis may be taken with or without food.   Try to take the dose about the same time in the morning and in the evening. If you have difficulty swallowing the tablet whole please discuss with your pharmacist how to take the medication safely.  Take Eliquis exactly as prescribed and DO NOT stop taking Eliquis without talking to the doctor who prescribed the medication.  Stopping may increase your risk of developing a new blood clot.  Refill your prescription before you run out.  After discharge, you should have regular check-up appointments with your healthcare provider that is prescribing your Eliquis.    What do you do if you miss a dose? If a dose of ELIQUIS is not taken at the scheduled time, take it as soon as possible on the same day and twice-daily administration should be resumed. The dose should not be doubled to make up for a missed dose.  Important Safety Information A possible side effect of Eliquis is bleeding. You should call your healthcare provider right away if you experience any of the following: Bleeding from an injury or your nose that does not stop. Unusual colored urine (red or dark brown) or unusual colored stools (red or black). Unusual bruising for unknown reasons. A serious fall or if you hit your head (even if there is no bleeding).  Some medicines may interact with Eliquis and might increase your risk of bleeding or clotting while on  Eliquis. To help avoid this, consult your healthcare provider or pharmacist prior to using any new prescription or non-prescription medications, including herbals, vitamins, non-steroidal anti-inflammatory drugs (NSAIDs) and supplements.  This website has more information on Eliquis (apixaban): http://[email or web address].com/eliquis/home

## 2022-07-07 NOTE — TOC Benefit Eligibility Note (Signed)
Patient Teacher, English as a foreign language completed.    The patient is currently admitted and upon discharge could be taking Eliquis 5 mg.  The current 30 day co-pay is $149.97 due to being in Coverage Gap (donut hole).   The patient is insured through [place], [place] Patient Advocate Specialist [place] Patient Advocate Team Direct Number: [phone]  Fax: [phone]

## 2022-07-07 NOTE — Progress Notes (Signed)
  Subjective:  Patient ID: [name], male    DOB: 18-Nov-1945,  MRN: [id number]  Chief Complaint  Patient presents with   Nail Problem    Diabetic foot care. Toenails growing over    76 y.o. male presents with the above complaint. History confirmed with patient.  He is here today with his daughter, he has not had his nails cut in some time again they are causing pain in his shoes  Objective:  Physical Exam: Bilateral he has moderate to severe lymphedema with +3 pitting edema in the bilateral lower extremities.  There is interdigital maceration.  Severe onychomycosis and onychogryphosis of all 10 digits with curving of the nails around the tips of the toes.  No open wounds are noted.  Assessment:   1. Pain due to onychomycosis of toenails of both feet   2. Encounter for diabetic foot exam [facility])      Plan:  Patient was evaluated and treated and all questions answered.  Patient educated on diabetes. Discussed proper diabetic foot care and discussed risks and complications of disease. Educated patient in depth on reasons to return to the office immediately should he/she discover anything concerning or new on the feet. All questions answered. Discussed proper shoes as well.  Annual diabetic risk exam performed today, he is at moderate risk of complication due to his increasing hemoglobin A1c C and lymphedema  Discussed the etiology and treatment options for the condition in detail with the patient. Educated patient on the topical and oral treatment options for mycotic nails. Recommended debridement of the nails today. Sharp and mechanical debridement performed of all painful and mycotic nails today. Nails debrided in length and thickness using a nail nipper and a mechanical burr to level of comfort. Discussed treatment options including appropriate shoe gear. Follow up as needed for painful nails.   Return in about 3 months (around 10/02/2022) for painful nails .

## 2022-07-07 NOTE — Evaluation (Addendum)
Physical Therapy Evaluation Patient Details Name: [name] MRN: [id number] DOB: 03-03-1946 Today's Date: 07/07/2022  History of Present Illness  77 year old admitted 07/04/22 due to worsening confusion and evaluation for urinary symptoms; found to have Bilateral PE, acute hypoxic respiratory failure.PMHx: PAF not on anticoagulation secondary to frequent fall, subdural hematoma secondary to fall 04/20/2022, chronic ambulation dysfunction uses roller walker, COPD, prostate cancer status post prostatectomy, BPH, COPD, chronic heart failure , CKD stage II, CVA.  Clinical Impression  Pt admitted with above diagnosis. Mod assist for transfer from low recliner in room (uses lift chair independently at home PTA - anticipate he will be fine with transfer at home but will have 24/7 assist at home if needed stating son-in-law can help if required.) Min guard to ambulate 2 bouts of 30 feet with RW today. No buckling, pt easily fatigued however no DOE and SpO2 98% on RA during session. Pt currently with functional limitations due to the deficits listed below (see PT Problem List). Pt will benefit from skilled PT to increase their independence and safety with mobility to allow discharge to the venue listed below.    Addendum: Patient will benefit from w/c due to limited functional capacity and difficulty walking. This will make it much safer and easier for family to get him to and from appointments as well.        Recommendations for follow up therapy are one component of a multi-disciplinary discharge planning process, led by the attending physician.  Recommendations may be updated based on patient status, additional functional criteria and insurance authorization.  Follow Up Recommendations Home health PT      Assistance Recommended at Discharge Intermittent Supervision/Assistance  Patient can return home with the following  A little help with walking and/or transfers;A little help with  bathing/dressing/bathroom;Assistance with cooking/housework;Help with stairs or ramp for entrance;Assist for transportation    Equipment Recommendations Wheelchair (measurements PT);Wheelchair cushion (measurements PT)  Recommendations for Other Services       Functional Status Assessment Patient has had a recent decline in their functional status and demonstrates the ability to make significant improvements in function in a reasonable and predictable amount of time.     Precautions / Restrictions Precautions Precautions: Fall Precaution Comments: monitor O2 Restrictions Weight Bearing Restrictions: No      Mobility  Bed Mobility               General bed mobility comments: In recliner when PT entered room    Transfers Overall transfer level: Needs assistance Equipment used: Rolling walker (2 wheels) Transfers: Sit to/from Stand Sit to Stand: Mod assist           General transfer comment: Mod assist for boost to stand from recliner, cues for hand placement. likely difficult due to low chair and pt height (6'1") Practiced x2, good momentum to rock, once upright pt steady on RW for support. uses lift chair at home.    Ambulation/Gait Ambulation/Gait assistance: Min guard Gait Distance (Feet): 30 Feet (x2) Assistive device: Rolling walker (2 wheels) Gait Pattern/deviations: Step-through pattern, Decreased stride length, Decreased dorsiflexion - right, Decreased dorsiflexion - left, Trunk flexed Gait velocity: decreased Gait velocity interpretation: <1.31 ft/sec, indicative of household ambulator Pre-gait activities: Stood at edge of chair working on upright posture and foot placement prior to walking. General Gait Details: Cues for larger step length and upright posture. Tolerated 2 bouts throughout room with min guard assist for safety. Occasionally letting RW get too far ahead but corrects when cued.  No buckling noted but pt does feel weak and less confident from  [place]. SpO2 98% on RA during bout.  Stairs Stairs:  (declines trying today.)          Wheelchair Mobility    Modified Rankin (Stroke Patients Only)       Balance Overall balance assessment: Needs assistance Sitting-balance support: No upper extremity supported, Feet supported Sitting balance-Leahy Scale: Fair     Standing balance support: Bilateral upper extremity supported Standing balance-Leahy Scale: Poor Standing balance comment: BIL UE support on RW while standing.                             Pertinent Vitals/Pain Pain Assessment Pain Assessment: No/denies pain    Home Living Family/patient expects to be discharged to:: Private residence Living Arrangements: Children Available Help at Discharge: Family;Available PRN/intermittently Type of Home: House Home Access: Stairs to enter Entrance Stairs-Rails: Left Entrance Stairs-Number of Steps: 3   Home Layout: One level Home Equipment: Conservation officer, nature (2 wheels);Toilet riser;Shower seat;Hospital bed (Lift chair)      Prior Function Prior Level of Function : Needs assist             Mobility Comments: Reports he has been working with PT and OT at home. Walking with RW.       Hand Dominance   Dominant Hand: Right    Extremity/Trunk Assessment   Upper Extremity Assessment Upper Extremity Assessment: Defer to OT evaluation    Lower Extremity Assessment Lower Extremity Assessment: Generalized weakness       Communication   Communication: No difficulties  Cognition Arousal/Alertness: Awake/alert Behavior During Therapy: WFL for tasks assessed/performed Overall Cognitive Status: Impaired/Different from baseline Area of Impairment: Orientation, Following commands, Safety/judgement, Problem solving                 Orientation Level: Disoriented to, Time (correct year, thinks it is november, thought he had been in the hosptial for a very long period of time.)     Following  Commands: Follows one step commands with increased time, Follows one step commands consistently Safety/Judgement: Decreased awareness of deficits   Problem Solving: Slow processing          General Comments General comments (skin integrity, edema, etc.): SpO2 98% on RA during session, no SOB or DOE noted.Placed back on 2L at end of session as found. RN notified.    Exercises     Assessment/Plan    PT Assessment Patient needs continued PT services  PT Problem List Decreased strength;Decreased activity tolerance;Decreased balance;Decreased mobility;Decreased cognition;Decreased knowledge of use of DME;Decreased safety awareness;Cardiopulmonary status limiting activity;Obesity       PT Treatment Interventions DME instruction;Gait training;Stair training;Functional mobility training;Therapeutic activities;Therapeutic exercise;Balance training;Cognitive remediation;Neuromuscular re-education;Patient/family education    PT Goals (Current goals can be found in the Care Plan section)  Acute Rehab PT Goals Patient Stated Goal: Go home soon PT Goal Formulation: With patient Time For Goal Achievement: 07/21/22 Potential to Achieve Goals: Good    Frequency Min 3X/week     Co-evaluation               AM-PAC PT "6 Clicks" Mobility  Outcome Measure Help needed turning from your back to your side while in a flat bed without using bedrails?: A Little Help needed moving from lying on your back to sitting on the side of a flat bed without using bedrails?: A Little Help needed moving to and from  a bed to a chair (including a wheelchair)?: A Little Help needed standing up from a chair using your arms (e.g., wheelchair or bedside chair)?: A Lot Help needed to walk in hospital room?: A Little Help needed climbing 3-5 steps with a railing? : A Lot 6 Click Score: 16    End of Session Equipment Utilized During Treatment: Gait belt;Oxygen Activity Tolerance: Patient tolerated treatment  well Patient left: in chair;with call bell/phone within reach (Eating lunch; left as found.) Nurse Communication: Mobility status PT Visit Diagnosis: Unsteadiness on feet (R26.81);Other abnormalities of gait and mobility (R26.89);Muscle weakness (generalized) (M62.81);History of falling (Z91.81);Difficulty in walking, not elsewhere classified (R26.2)    Time: 9292-4462 PT Time Calculation (min) (ACUTE ONLY): 32 min   Charges:   PT Evaluation $PT Eval Moderate Complexity: 1 Mod PT Treatments $Gait Training: 8-22 mins        Candie Mile, PT, DPT Physical Therapist Acute Rehabilitation Services [place] 07/07/2022, 2:32 PM

## 2022-07-07 NOTE — Progress Notes (Signed)
ANTICOAGULATION CONSULT NOTE - Initial Consult  Pharmacy Consult for heparin gtt > eliquis  Indication: atrial fibrillation and pulmonary embolus  Allergies  Allergen Reactions   Ciprofloxacin Other (See Comments)    All over weakness    Patient Measurements: Height: '6\' 1"'$  (185.4 cm) Weight: 103.1 kg (227 lb 4.7 oz) IBW/kg (Calculated) : 79.9  Vital Signs: Temp: 97.5 F (36.4 C) (12/18 0847) Temp Source: Oral (12/18 0847) BP: 130/75 (12/18 0847) Pulse Rate: 65 (12/18 0847)  Labs: Recent Labs    07/04/22 1149 07/05/22 0216 07/05/22 2126 07/06/22 0150 07/06/22 0803 07/06/22 1543 07/06/22 1825 07/06/22 2003  HGB 14.3  --   --  12.0*  --   --   --   --   HCT 45.7  --   --  36.4*  --   --   --   --   PLT 258  --   --  190  --   --   --   --   HEPARINUNFRC  --    < > 0.66 0.70 1.09*  --  0.75*  --   CREATININE 1.41*   < > 1.58* 1.46*  --  1.59*  --   --   TROPONINIHS  --   --   --   --   --   --  37* 31*   < > = values in this interval not displayed.    Estimated Creatinine Clearance: 50.6 mL/min (A) (by C-G formula based on SCr of 1.59 mg/dL (H)).   Assessment: 76 y/o M here with UTI, found to have new onset pulmonary embolus on CT, had a SDH a few months ago, repeat CT head on 12/16 with no issues. Pharmacy consulted for heparin>eliquis transition since patient has refused lab draws this AM.     Last CBC (12/17 AM): Hgb 12/Plt 190  Goal of Therapy:   Monitor platelets by anticoagulation protocol: Yes   Plan:  Stop heparin gtt at time of first eliquis dose  Start eliquis '10mg'$  BID  x7 days followed by eliquis '5mg'$  BID thereafter -f/u CBC , s/sx bleeding -care coordination by CM for eliquis affordability    Wilson Singer, PharmD Clinical Pharmacist 07/07/2022 9:33 AM

## 2022-07-07 NOTE — TOC Initial Note (Addendum)
Transition of Care (TOC) - Initial/Assessment Note   Patient from home with daughter [name] Dunaj [phone].   Spoke to [place] via phone.   NCM received a secure chat from pharmacy :   eliquis copay for him to be pre-emptive and it looks like he's in the donut hole for coverage now (so copay currently would be ~$150/month and then once coverage resets it will likely be $47/mo) .   NCM discussed above with [place] . He has been on Eliquis in past. She is not sure if he has used the 30 day free card or not. Patient can only use 30 day free card one time. [name] states she can afford $150.00 if he has used card.    NCM will place 30 day free card at bedside.   Patient has walker at home already. Daughter would like a wheel chair for when he goes to appointments and has to walk long distances.   PT to see patient today. Will wait recommendations.   NCM sent secure chat to pharmacist and MD . PT not signed in as of yet.   Patient active with [facility] for HHPT,OT and aide . Calvin with [facility] will need orders and face to face . Daughter wants to continue services.  PT recommending wheel chair . Order placed and DME progress note entered for MD to sign both   Patient Details  Name: [name] MRN: [id number] Date of Birth: 10-31-1945  Transition of Care [facility] CM/SW Contact:    Marilu Favre, RN Phone Number: 07/07/2022, 10:07 AM  Clinical Narrative:                   Expected Discharge Plan: [place] Barriers to Discharge: Continued Medical Work up   Patient Goals and CMS Choice   CMS Medicare.gov Compare Post Acute Care list provided to:: Patient Represenative (must comment) (Daughter [name]) Choice offered to / list presented to : Adult Children  Expected Discharge Plan and Services Expected Discharge Plan: [place]   Discharge Planning Services: CM Consult Post Acute Care Choice: [place]  arrangements for the past 2 months: Single Family Home                 DME Arranged:  (await PT evaluation) DME Agency: NA       HH Arranged: PT, OT, Nurse's Aide HH Agency: [facility] Date HH Agency Contacted: 07/07/22 Time HH Agency Contacted: 22 Representative spoke with at [place] [facility]: [place] Arrangements/Services Living arrangements for the past 2 months: [place] with:: Adult Children Patient language and need for interpreter reviewed:: Yes        Need for Family Participation in Patient Care: Yes (Comment) Care giver support system in place?: Yes (comment) Current home services: DME Criminal Activity/Legal Involvement Pertinent to Current Situation/Hospitalization: No - Comment as needed  Activities of Daily Living Home Assistive Devices/Equipment: [place] Rile (specify type) ADL Screening (condition at time of admission) Patient's cognitive ability adequate to safely complete daily activities?: Yes Is the patient deaf or have difficulty hearing?: No Does the patient have difficulty seeing, even when wearing glasses/contacts?: No Does the patient have difficulty concentrating, remembering, or making decisions?: Yes Patient able to express need for assistance with ADLs?: Yes Does the patient have difficulty dressing or bathing?: No Independently performs ADLs?: Yes (appropriate for developmental age) Does the patient have difficulty walking or climbing stairs?: Yes Weakness of  Legs: Both Weakness of Arms/Hands: None  Permission Sought/Granted   Permission granted to share information with : Yes, Verbal Permission Granted  Share Information with NAME: daughter [name]  Permission granted to share info w AGENCY: [facility]        Emotional Assessment              Admission diagnosis:  UTI (urinary tract infection) [N39.0] Abnormal chest x-ray [R93.89] Acute cystitis with hematuria [T41.96] Acute metabolic  encephalopathy [Q22.97] Patient Active Problem List   Diagnosis Date Noted   Bilateral pulmonary embolism ([place]) 07/05/2022   UTI (urinary tract infection) 07/04/2022   Urinary tract infection without hematuria 06/28/2022   Confusion 06/28/2022   Hypercoagulable state due to longstanding persistent atrial fibrillation ([place]) 06/08/2022   Foul smelling urine 04/30/2022   Nausea and vomiting 04/30/2022   SDH (subdural hematoma) ([place]) 04/17/2022   Subdural hematoma ([place]) 04/04/2022   New daily persistent headache 04/02/2022   COVID-19 01/26/2022   Hypomagnesemia 01/25/2022   CVA (cerebral vascular accident) ([place]) 01/24/2022   Cough 01/24/2022   Elevated troponin 01/24/2022   Fever 01/24/2022   Bradycardia 11/21/2021   Vitamin D deficiency 11/21/2021   Other fatigue 11/18/2021   Bilateral leg edema 07/08/2021   Gait disorder 12/16/2020   HTN (hypertension) 12/16/2020   Aortic atherosclerosis ([place]) 12/12/2020   Diverticulitis 10/23/2020   Fall 10/22/2020   Sepsis ([place]) 10/22/2020   Acute lower UTI 10/22/2020   NSVT (nonsustained ventricular tachycardia) ([place])    Multiple myeloma ([place])    AKI (acute kidney injury) ([place]) 02/15/2020   General weakness    Diabetic leg ulcer ([place]) 10/06/2019   Chronic kidney disease, stage 3b ([place]) 10/06/2019   Diabetic ulcer of right lower leg ([place]) 02/23/2019   Abnormal LFTs 04/27/2018   Open wound of scrotum 03/01/2018   Acute on chronic diastolic CHF (congestive heart failure) ([place]) 02/25/2018   Postoperative anemia due to acute blood loss 02/25/2018   Occipital stroke ([place]), left 09/23/2017   Diabetes mellitus type II, non insulin dependent ([place])    Encounter for well adult exam with abnormal findings 07/31/2017   Venous insufficiency (chronic) (peripheral) with chronic lower extremity edema 04/04/2016   Junctional tachycardia 03/18/2015   Exertional dyspnea 07/05/2014   Obesity (BMI 30-39.9) 05/04/2014   Benign essential tremor 05/04/2014    Peripheral neuropathy 12/21/2013   Nonspecific abnormal finding in stool contents 09/06/2012   Heme positive stool 08/09/2012   Difficulty passing stool 08/09/2012   Sebaceous cyst 04/15/2012   Complex renal cyst 01/13/2011   Back pain 01/02/2011   Permanent atrial fibrillation ([place]) - now off amiodarone; asymptomatic. CHA2DS2-VASc Score -5 (on Xarelto) 01/02/2011   KNEE PAIN, LEFT 08/27/2009   COLONIC POLYPS, HX OF 08/08/2008   ERECTILE DYSFUNCTION 09/27/2007   ULNAR NEUROPATHY 09/27/2007   VENTRICULAR HYPERTROPHY, LEFT 09/27/2007   ALLERGIC RHINITIS 09/27/2007   Arthropathy 09/27/2007   GANGLION CYST, WRIST, LEFT 09/27/2007   HEMORRHOIDS, INTERNAL 06/10/2007   DIVERTICULOSIS, COLON 06/10/2007   DISC DISEASE, LUMBAR 06/03/2007   NEPHROLITHIASIS, HX OF 06/03/2007   Diabetes mellitus with neuropathy ([place]) 02/11/2007   Hyperlipidemia with target LDL less than 100 02/11/2007   ANXIETY 02/11/2007   Nondependent Alcohol Abuse, in Remission 02/11/2007   Hypertensive heart disease with chronic diastolic congestive heart failure ([place]) 02/11/2007   COPD (chronic obstructive pulmonary disease) ([place]) 02/11/2007   BENIGN PROSTATIC HYPERTROPHY 02/11/2007   LOW BACK PAIN 02/11/2007   Disturbance of skin sensation 02/11/2007   PROSTATE CANCER, HX  OF 02/11/2007   PCP:  Biagio Borg, MD Pharmacy:   [facility] [address] [place]), [place] - [address] [place] [address] Phone: [phone] Fax: [place], [place] [address] [place] [address] Phone: [phone] Fax: [phone]     Social Determinants of Health (SDOH) Interventions Housing Interventions: Intervention Not Indicated  Readmission Risk Interventions     No data to display

## 2022-07-07 NOTE — Progress Notes (Signed)
PROGRESS NOTE    [name]  TMH:[id number] DOB: 03-Feb-1946 DOA: 07/04/2022 PCP: Biagio Borg, MD   Brief Narrative: 76 year old with past medical history significant for PAF not on anticoagulation secondary to frequent fall, subdural hematoma secondary to fall 04/20/2022, chronic ambulation dysfunction uses roller walker, COPD, prostate cancer status post prostatectomy, BPH, COPD, chronic heart failure preserved ejection fraction, CKD stage II, CVA brought by family due to worsening confusion and evaluation for urinary symptoms.  Patient presented with generalized weakness, confusion and agitation for the last 2 weeks.  He has become more unsteady on his feet.  Recent diagnosed with E. coli UTI on December 6 and started on ampicillin.  Patient has not been able to start taking antibiotics due to decreased oral intake and confusion.  Family noticed blood in the urine.  CT chest was done to evaluate pulmonary nodule, he was found to have Large volume pulmonary embolism in the right main pulmonary artery and lobar left upper and lower lobe pulmonary arteries. No findings to suggest right heart strain.  Repeated CT head showed resolution of prior subdural hematoma.  Dr. Claria Dice discussed with neurosurgery on-call and okay to proceed with heparin drip.    Assessment & Plan:   Principal Problem:   UTI (urinary tract infection) Active Problems:   COPD (chronic obstructive pulmonary disease) (HCC)   Benign essential tremor   Chronic kidney disease, stage 3b (HCC)   Acute lower UTI   Bilateral pulmonary embolism (HCC)   1-Bilateral PE, acute hypoxic respiratory failure oxygen saturation on room air 86%. -CT chest : Large volume pulmonary embolism in the right main pulmonary artery and lobar left upper and lower lobe pulmonary arteries. -Continue with heparin gtt for 48 hours to make sure no new focal deficit.  -ECHO RV function.  Doppler: Negative for DVT.  -98 % Saturation is  stable on 2 L of oxygen -Plan to transition to Eliquis today.   2-Subdural Hematoma;  -Recent diagnosed with subdural hematoma due to fall on 9/15. -Progression of subdural hematoma, and Headaches 9/28; admitted to the hospital, he underwent diagnostic cerebral angiogram and attempt of right MMA embolization but procedure was aborted due to likelihood of increased risk of a stroke and other significant morbidity. -CT head done 10/18: Interval decrease in size of right cerebral convexity subdural hematoma, now measuring up to 5 mm, previously 10 mm.  -CT head 12/16: No acute intracranial abnormalities.  Case discussed with neurosurgeon on call, ok to start heparin.  No new deficit.    3-Acute Metabolic Encephalopathy;  In setting of UTI.  Delirium precaution.    4-Incidental lungs nodule, CT chest showed PE.  Small right pleural effusion, partially loculated. Loculated fluid accounts for the radiographic appearance. No suspicious pulmonary nodules.   5-UTI, hematuria;  UA with more than 50 WBC,  Urine culture: 50 K Proteous Penneri Continue with IV ceftriaxone.  Hematuria resolved.   6-Hypokalemia; Replaced.   7-Non sustain VT: K low this am, replete orally and IV. Repeat Bmet this afternoon. Mg 1.9 improved. Give another dose of IV mg. ECHO Normal EF.  Asymptomatic.  On Coreg.  Another episode of VT asymptomatic. He has received potassium and mg supplement. Will give another 20 meq of Potasium.  Cardiology consulted. Plan t increase coreg to 6.25 mg BID  8-Cold extremities; PVD:  Daughter notice worsening cold feet. After removing stocking compression, cold extremities  improved. Patient denies feet pain.  ABI: Resting right ankle-brachial index indicates moderate right lower  extremity arterial disease. The right toe-brachial index is absent. Left: The left toe-brachial index is absent.  Although ankle brachial indices are within normal limits (0.95-1.29), arterial Doppler  waveforms at the ankle suggest some component of arterial  occlusive disease.  Appreciate Vascular evaluation: recommend LDL less than 100, A1c less 7, aspirin and atorvastatin.   9-Acute on chronic heart failure preserved ejection fraction: He has significant lower extremity edema.  Will hold Lasix due to increased creatinine.  He might need albumin and Lasix tomorrow. ECHO Ef 65 %  10-Chronic A-fib Continue with Coreg. BPH; Started on flomax.  Needs follow up with urology  PVR   CKD stage II;  1.3--1.5 Monitor  Estimated body mass index is 29.99 kg/m as calculated from the following:   Height as of this encounter: '6\' 1"'$  (1.854 m).   Weight as of this encounter: 103.1 kg.   DVT prophylaxis: Heparin Code Status: Full code Family Communication: Discussed with daughter 12/16 Disposition Plan:  Status is: Observation The patient will require care spanning > 2 midnights and should be moved to inpatient because: Management of PE and respiratory failure. Home hopefully tomorrow.     Consultants:  None  Procedures:  Echo Doppler ABI  Antimicrobials:    Subjective: He is breathing ok, arm pain , he is getting K  Denies LE pain.    Objective: Vitals:   07/06/22 1600 07/06/22 1933 07/06/22 1948 07/07/22 0847  BP: 120/80 134/87 (!) 146/75 130/75  Pulse: (!) 58 66 72 65  Resp: '17 18 20 17  '$ Temp: 98.3 F (36.8 C) 98.3 F (36.8 C) 97.7 F (36.5 C) (!) 97.5 F (36.4 C)  TempSrc: Oral Oral Oral Oral  SpO2: 98% 100% 100% 100%  Weight:      Height:        Intake/Output Summary (Last 24 hours) at 07/07/2022 1515 Last data filed at 07/07/2022 0640 Gross per 24 hour  Intake 554.35 ml  Output 800 ml  Net -245.65 ml    Filed Weights   07/05/22 0120  Weight: 103.1 kg    Examination:  General exam: NAD Respiratory system: CTA Cardiovascular system: SS 1, S 2 RRR Gastrointestinal system: BS present, soft, nt Central near: Alert Extremities: Plus 2  edema    Data Reviewed: I have personally reviewed following labs and imaging studies  CBC: Recent Labs  Lab 07/04/22 1149 07/06/22 0150 07/07/22 1339  WBC 6.3 6.1 4.0  NEUTROABS 4.4  --   --   HGB 14.3 12.0* 13.7  HCT 45.7 36.4* 45.1  MCV 84.3 82.0 86.2  PLT 258 190 009    Basic Metabolic Panel: Recent Labs  Lab 07/04/22 1149 07/05/22 0216 07/05/22 2126 07/06/22 0150 07/06/22 1543 07/07/22 1339  NA 140 139 139 137 137 138  K 3.3* 3.2* 3.5 2.9* 3.8 4.3  CL 104 103 104 103 104 103  CO2 '26 23 27 25 25 25  '$ GLUCOSE 114* 105* 111* 102* 119* 112*  BUN '11 12 14 13 12 12  '$ CREATININE 1.41* 1.51* 1.58* 1.46* 1.59* 1.44*  CALCIUM 8.5* 8.3* 8.0* 7.9* 8.2* 8.3*  MG 1.4*  --  1.4* 1.9  --   --   PHOS  --   --   --  2.8  --   --     GFR: Estimated Creatinine Clearance: 55.9 mL/min (A) (by C-G formula based on SCr of 1.44 mg/dL (H)). Liver Function Tests: Recent Labs  Lab 07/04/22 1149  AST 26  ALT 13  ALKPHOS 132*  BILITOT 0.6  PROT 7.6  ALBUMIN 2.4*    No results for input(s): "LIPASE", "AMYLASE" in the last 168 hours. No results for input(s): "AMMONIA" in the last 168 hours. Coagulation Profile: No results for input(s): "INR", "PROTIME" in the last 168 hours. Cardiac Enzymes: No results for input(s): "CKTOTAL", "CKMB", "CKMBINDEX", "TROPONINI" in the last 168 hours. BNP (last 3 results) No results for input(s): "PROBNP" in the last 8760 hours. HbA1C: No results for input(s): "HGBA1C" in the last 72 hours. CBG: No results for input(s): "GLUCAP" in the last 168 hours. Lipid Profile: No results for input(s): "CHOL", "HDL", "LDLCALC", "TRIG", "CHOLHDL", "LDLDIRECT" in the last 72 hours. Thyroid Function Tests: No results for input(s): "TSH", "T4TOTAL", "FREET4", "T3FREE", "THYROIDAB" in the last 72 hours. Anemia Panel: No results for input(s): "VITAMINB12", "FOLATE", "FERRITIN", "TIBC", "IRON", "RETICCTPCT" in the last 72 hours. Sepsis Labs: Recent Labs  Lab  07/04/22 1758 07/05/22 0216  LATICACIDVEN 2.2* 2.8*     Recent Results (from the past 240 hour(s))  Culture, blood (routine x 2)     Status: None (Preliminary result)   Collection Time: 07/04/22  5:58 PM   Specimen: BLOOD  Result Value Ref Range Status   Specimen Description BLOOD RIGHT ANTECUBITAL  Final   Special Requests   Final    BOTTLES DRAWN AEROBIC AND ANAEROBIC Blood Culture adequate volume   Culture   Final    NO GROWTH 3 DAYS Performed at [facility], [address], [place], [place] [address]    Report Status PENDING  Incomplete  Urine Culture     Status: Abnormal (Preliminary result)   Collection Time: 07/04/22  9:38 PM   Specimen: Urine, Clean Catch  Result Value Ref Range Status   Specimen Description URINE, CLEAN CATCH  Final   Special Requests NONE  Final   Culture (A)  Final    50,000 COLONIES/mL PROTEUS PENNERI REPEATING SUSCEPTIBILITIES Performed at [facility], [place] [name], [place], [place] [address]    Report Status PENDING  Incomplete  Culture, blood (routine x 2)     Status: None (Preliminary result)   Collection Time: 07/05/22  2:16 AM   Specimen: BLOOD  Result Value Ref Range Status   Specimen Description BLOOD BLOOD LEFT HAND  Final   Special Requests   Final    BOTTLES DRAWN AEROBIC AND ANAEROBIC Blood Culture adequate volume   Culture   Final    NO GROWTH 2 DAYS Performed at [facility], [place] [address], [place],  [address]    Report Status PENDING  Incomplete         Radiology Studies: ECHOCARDIOGRAM COMPLETE  Result Date: 07/06/2022    ECHOCARDIOGRAM REPORT   Patient Name:   [name] Date of Exam: 07/06/2022 Medical Rec #:  [id number]           Height:       73.0 in Accession #:    [id number]          Weight:       227.3 lb Date of Birth:  [birth year]           BSA:          2.272 m Patient Age:    85 years            BP:           115/66 mmHg Patient Gender: M  HR:            60 bpm. Exam Location:  Inpatient Procedure: 2D Echo, Cardiac Doppler and Color Doppler Indications:    abnormal ecg  History:        Patient has prior history of Echocardiogram examinations, most                 recent 01/25/2022. COPD and chronic kidney disease,                 Arrythmias:Atrial Fibrillation; Risk Factors:Diabetes,                 Hypertension and Dyslipidemia.  Sonographer:    Johny Chess RDCS Referring Phys: [phone] Quantarius Genrich A Anjanette Gilkey IMPRESSIONS  1. Left ventricular ejection fraction, by estimation, is 65 to 70%. The left ventricle has normal function. The left ventricle has no regional wall motion abnormalities. There is severe asymmetric left ventricular hypertrophy of the septal segment. Left  ventricular diastolic parameters are indeterminate.  2. Right ventricular systolic function is normal. The right ventricular size is normal. Mildly increased right ventricular wall thickness.  3. Left atrial size was severely dilated.  4. Right atrial size was mildly dilated.  5. Large pleural effusion in the left lateral region.  6. The mitral valve is abnormal. No evidence of mitral valve regurgitation. No evidence of mitral stenosis.  7. The aortic valve is tricuspid. Aortic valve regurgitation is not visualized. No aortic stenosis is present.  8. The inferior vena cava is dilated in size with >50% respiratory variability, suggesting right atrial pressure of 8 mmHg. Comparison(s): No significant change from prior study. FINDINGS  Left Ventricle: Left ventricular ejection fraction, by estimation, is 65 to 70%. The left ventricle has normal function. The left ventricle has no regional wall motion abnormalities. The left ventricular internal cavity size was small. There is severe asymmetric left ventricular hypertrophy of the septal segment. Left ventricular diastolic parameters are indeterminate. Right Ventricle: The right ventricular size is normal. Mildly increased right ventricular wall thickness.  Right ventricular systolic function is normal. Left Atrium: Left atrial size was severely dilated. Right Atrium: Right atrial size was mildly dilated. Pericardium: Trivial pericardial effusion is present. The pericardial effusion is lateral to the left ventricle. Mitral Valve: The mitral valve is abnormal. No evidence of mitral valve regurgitation. No evidence of mitral valve stenosis. Tricuspid Valve: The tricuspid valve is normal in structure. Tricuspid valve regurgitation is not demonstrated. No evidence of tricuspid stenosis. Aortic Valve: The aortic valve is tricuspid. Aortic valve regurgitation is not visualized. No aortic stenosis is present. Pulmonic Valve: The pulmonic valve was normal in structure. Pulmonic valve regurgitation is trivial. No evidence of pulmonic stenosis. Aorta: The aortic root and ascending aorta are structurally normal, with no evidence of dilitation. Venous: The inferior vena cava is dilated in size with greater than 50% respiratory variability, suggesting right atrial pressure of 8 mmHg. IAS/Shunts: No atrial level shunt detected by color flow Doppler. Additional Comments: There is a large pleural effusion in the left lateral region.  LEFT VENTRICLE PLAX 2D LVIDd:         3.50 cm LVIDs:         2.30 cm LV PW:         1.40 cm LV IVS:        1.20 cm LVOT diam:     2.40 cm LVOT Area:     4.52 cm  RIGHT VENTRICLE  IVC RV Basal diam:  2.80 cm  IVC diam: 2.50 cm LEFT ATRIUM              Index        RIGHT ATRIUM           Index LA diam:        4.20 cm  1.85 cm/m   RA Area:     25.80 cm LA Vol (A2C):   106.0 ml 46.66 ml/m  RA Volume:   80.70 ml  35.52 ml/m LA Vol (A4C):   128.0 ml 56.34 ml/m LA Biplane Vol: 123.0 ml 54.14 ml/m                        PULMONIC VALVE AORTA                 PR End Diast Vel: 7.40 msec Ao Root diam: 3.80 cm Ao Asc diam:  3.30 cm  SHUNTS Systemic Diam: 2.40 cm Rudean Haskell MD Electronically signed by Rudean Haskell MD Signature  Date/Time: 07/06/2022/5:42:32 PM    Final    VAS [place] ABI WITH/WO TBI  Result Date: 07/06/2022  LOWER EXTREMITY DOPPLER STUDY Patient Name:  [name]  Date of Exam:   07/05/2022 Medical Rec #: [id number]            Accession #:    [id number] Date of Birth: 1946-03-26            Patient Gender: M Patient Age:   40 years Exam Location:  [facility] Procedure:      VAS [place] ABI WITH/WO TBI Referring Phys: Jerald Kief Miguel Medal --------------------------------------------------------------------------------  Indications: Peripheral artery disease. High Risk Factors: Hypertension, hyperlipidemia, Diabetes, prior CVA. Other Factors: CKD, CHF, Afib,.  Limitations: Today's exam was limited due to edema and thickened skin. Comparison Study: No previous exams Performing Technologist: Rogelia Rohrer RVT/RDMS  Examination Guidelines: A complete evaluation includes at minimum, Doppler waveform signals and systolic blood pressure reading at the level of bilateral brachial, anterior tibial, and posterior tibial arteries, when vessel segments are accessible. Bilateral testing is considered an integral part of a complete examination. Photoelectric Plethysmograph (PPG) waveforms and toe systolic pressure readings are included as required and additional duplex testing as needed. Limited examinations for reoccurring indications may be performed as noted.  ABI Findings: +---------+------------------+-----+----------+--------+ Right    Rt Pressure (mmHg)IndexWaveform  Comment  +---------+------------------+-----+----------+--------+ PTA      87                0.70 monophasic         +---------+------------------+-----+----------+--------+ DP       83                0.67 monophasic         +---------+------------------+-----+----------+--------+ Great Toe                       Absent             +---------+------------------+-----+----------+--------+ +---------+------------------+-----+----------+-------+  Left     Lt Pressure (mmHg)IndexWaveform  Comment +---------+------------------+-----+----------+-------+ Brachial 124                                      +---------+------------------+-----+----------+-------+ PTA      99                0.80 monophasic        +---------+------------------+-----+----------+-------+  DP       120               0.97 monophasic        +---------+------------------+-----+----------+-------+ Great Toe                       Absent            +---------+------------------+-----+----------+-------+  Summary: Right: Resting right ankle-brachial index indicates moderate right lower extremity arterial disease. The right toe-brachial index is absent. Left: The left toe-brachial index is absent. Although ankle brachial indices are within normal limits (0.95-1.29), arterial Doppler waveforms at the ankle suggest some component of arterial occlusive disease. *See table(s) above for measurements and observations.  Electronically signed by Jamelle Haring on 07/06/2022 at 11:16:34 AM.    Final    VAS [place] LOWER EXTREMITY VENOUS (DVT)  Result Date: 07/06/2022  Lower Venous DVT Study Patient Name:  [name]  Date of Exam:   07/05/2022 Medical Rec #: [id number]            Accession #:    [id number] Date of Birth: 25-Sep-1945            Patient Gender: M Patient Age:   70 years Exam Location:  [facility] Procedure:      VAS [place] LOWER EXTREMITY VENOUS (DVT) Referring Phys: Wynetta Fines --------------------------------------------------------------------------------  Indications: Edema.  Risk Factors: Chronic venous insufficiency & edema. Limitations: Body habitus and poor ultrasound/tissue interface. Comparison Study: Previous exam on 10/24/20 was negative for DVT Performing Technologist: Rogelia Rohrer RVT, RDMS  Examination Guidelines: A complete evaluation includes B-mode imaging, spectral Doppler, color Doppler, and power Doppler as needed of all accessible  portions of each vessel. Bilateral testing is considered an integral part of a complete examination. Limited examinations for reoccurring indications may be performed as noted. The reflux portion of the exam is performed with the patient in reverse Trendelenburg.  +---------+---------------+---------+-----------+----------+-------------------+ RIGHT    CompressibilityPhasicitySpontaneityPropertiesThrombus Aging      +---------+---------------+---------+-----------+----------+-------------------+ CFV      Full           Yes      Yes                                      +---------+---------------+---------+-----------+----------+-------------------+ SFJ      Full                                                             +---------+---------------+---------+-----------+----------+-------------------+ FV Prox  Full           Yes      Yes                                      +---------+---------------+---------+-----------+----------+-------------------+ FV Mid   Full           Yes      Yes                                      +---------+---------------+---------+-----------+----------+-------------------+ FV DistalFull  Yes      Yes                                      +---------+---------------+---------+-----------+----------+-------------------+ PFV      Full                                                             +---------+---------------+---------+-----------+----------+-------------------+ POP      Full           Yes      Yes                                      +---------+---------------+---------+-----------+----------+-------------------+ PTV      Full                                         Not well visualized +---------+---------------+---------+-----------+----------+-------------------+ PERO     Full                                         Not well visualized  +---------+---------------+---------+-----------+----------+-------------------+   +---------+---------------+---------+-----------+----------+-------------------+ LEFT     CompressibilityPhasicitySpontaneityPropertiesThrombus Aging      +---------+---------------+---------+-----------+----------+-------------------+ CFV      Full           Yes      Yes                                      +---------+---------------+---------+-----------+----------+-------------------+ SFJ      Full                                                             +---------+---------------+---------+-----------+----------+-------------------+ FV Prox  Full           Yes      Yes                                      +---------+---------------+---------+-----------+----------+-------------------+ FV Mid   Full           Yes      Yes                                      +---------+---------------+---------+-----------+----------+-------------------+ FV DistalFull           Yes      Yes                                      +---------+---------------+---------+-----------+----------+-------------------+ PFV  Full                                                             +---------+---------------+---------+-----------+----------+-------------------+ POP      Full                                                             +---------+---------------+---------+-----------+----------+-------------------+ PTV      Full                                         Not well visualized +---------+---------------+---------+-----------+----------+-------------------+ PERO     Full                                         Not well visualized +---------+---------------+---------+-----------+----------+-------------------+     Summary: BILATERAL: - No evidence of deep vein thrombosis seen in the lower extremities, bilaterally. -No evidence of popliteal cyst, bilaterally.   *See  table(s) above for measurements and observations. Electronically signed by Jamelle Haring on 07/06/2022 at 11:16:25 AM.    Final         Scheduled Meds:  apixaban  10 mg Oral BID   Followed by   Derrill Memo ON 07/14/2022] apixaban  5 mg Oral BID   carvedilol  6.25 mg Oral BID WC   QUEtiapine  12.5 mg Oral QHS   rosuvastatin  40 mg Oral QPM   tamsulosin  0.4 mg Oral QPC supper   Continuous Infusions:  cefTRIAXone (ROCEPHIN)  IV 2 g (07/06/22 1759)     LOS: 2 days    Time spent: 35 minutes    [name] [name] A Kelton Bultman, MD [facility]   If 7PM-7AM, please contact night-coverage [email or web address]  07/07/2022, 3:15 PM

## 2022-07-07 NOTE — Progress Notes (Addendum)
Rounding Note    Patient Name: [name] Date of Encounter: 07/07/2022  [place] Cardiologist: Glenetta Hew, MD   Subjective   Pt asymptomatic with NSVT and Afib. Found sitting up with [place] on top of his head.  Inpatient Medications    Scheduled Meds:  carvedilol  6.25 mg Oral BID WC   QUEtiapine  12.5 mg Oral QHS   rosuvastatin  40 mg Oral QPM   tamsulosin  0.4 mg Oral QPC supper   Continuous Infusions:  cefTRIAXone (ROCEPHIN)  IV 2 g (07/06/22 1759)   heparin 1,100 Units/hr (07/06/22 2003)   PRN Meds: acetaminophen **OR** acetaminophen, albuterol, ondansetron **OR** ondansetron (ZOFRAN) IV, mouth rinse   Vital Signs    Vitals:   07/06/22 0853 07/06/22 1600 07/06/22 1933 07/06/22 1948  BP: 115/66 120/80 134/87 (!) 146/75  Pulse: (!) 53 (!) 58 66 72  Resp: '17 17 18 20  '$ Temp: 97.8 F (36.6 C) 98.3 F (36.8 C) 98.3 F (36.8 C) 97.7 F (36.5 C)  TempSrc:  Oral Oral Oral  SpO2: 96% 98% 100% 100%  Weight:      Height:        Intake/Output Summary (Last 24 hours) at 07/07/2022 0743 Last data filed at 07/07/2022 0640 Gross per 24 hour  Intake 914.35 ml  Output 1100 ml  Net -185.65 ml      07/05/2022    1:20 AM 06/25/2022    1:35 PM 06/03/2022   12:09 PM  Last 3 Weights  Weight (lbs) 227 lb 4.7 oz 225 lb 234 lb 12.8 oz  Weight (kg) 103.1 kg 102.059 kg 106.505 kg      Telemetry    Afib rate controlled in the 60-80s, PVCs, couplets, triplets, NSVT with longest run 23 beats, but generally less than 14 beats - Personally Reviewed  ECG    No new tracings - Personally Reviewed  Physical Exam   GEN: No acute distress.   Neck: No JVD Cardiac: irregular rhythm , regular rate Respiratory: Clear to auscultation bilaterally. GI: Soft, nontender, non-distended  MS: B LE edema with compression socks in place Neuro:  Nonfocal  Psych: Normal affect   Labs    High Sensitivity Troponin:   Recent Labs  Lab 07/06/22 1825  07/06/22 2003  TROPONINIHS 37* 31*     Chemistry Recent Labs  Lab 07/04/22 1149 07/05/22 0216 07/05/22 2126 07/06/22 0150 07/06/22 1543  NA 140   < > 139 137 137  K 3.3*   < > 3.5 2.9* 3.8  CL 104   < > 104 103 104  CO2 26   < > '27 25 25  '$ GLUCOSE 114*   < > 111* 102* 119*  BUN 11   < > '14 13 12  '$ CREATININE 1.41*   < > 1.58* 1.46* 1.59*  CALCIUM 8.5*   < > 8.0* 7.9* 8.2*  MG 1.4*  --  1.4* 1.9  --   PROT 7.6  --   --   --   --   ALBUMIN 2.4*  --   --   --   --   AST 26  --   --   --   --   ALT 13  --   --   --   --   ALKPHOS 132*  --   --   --   --   BILITOT 0.6  --   --   --   --   GFRNONAA 52*   < >  45* 50* 45*  ANIONGAP 10   < > '8 9 8   '$ < > = values in this interval not displayed.    Lipids No results for input(s): "CHOL", "TRIG", "HDL", "LABVLDL", "LDLCALC", "CHOLHDL" in the last 168 hours.  Hematology Recent Labs  Lab 07/04/22 1149 07/06/22 0150  WBC 6.3 6.1  RBC 5.42 4.44  HGB 14.3 12.0*  HCT 45.7 36.4*  MCV 84.3 82.0  MCH 26.4 27.0  MCHC 31.3 33.0  RDW 14.6 14.5  PLT 258 190   Thyroid No results for input(s): "TSH", "FREET4" in the last 168 hours.  BNPNo results for input(s): "BNP", "PROBNP" in the last 168 hours.  DDimer No results for input(s): "DDIMER" in the last 168 hours.   Radiology    ECHOCARDIOGRAM COMPLETE  Result Date: 07/06/2022    ECHOCARDIOGRAM REPORT   Patient Name:   CHIDUBEM CHAIRES Date of Exam: 07/06/2022 Medical Rec #:  [id number]           Height:       73.0 in Accession #:    [id number]          Weight:       227.3 lb Date of Birth:  1946/01/14           BSA:          2.272 m Patient Age:    33 years            BP:           115/66 mmHg Patient Gender: M                   HR:           60 bpm. Exam Location:  Inpatient Procedure: 2D Echo, Cardiac Doppler and Color Doppler Indications:    abnormal ecg  History:        Patient has prior history of Echocardiogram examinations, most                 recent 01/25/2022. COPD and chronic  kidney disease,                 Arrythmias:Atrial Fibrillation; Risk Factors:Diabetes,                 Hypertension and Dyslipidemia.  Sonographer:    Johny Chess RDCS Referring Phys: [phone] BELKYS A REGALADO IMPRESSIONS  1. Left ventricular ejection fraction, by estimation, is 65 to 70%. The left ventricle has normal function. The left ventricle has no regional wall motion abnormalities. There is severe asymmetric left ventricular hypertrophy of the septal segment. Left  ventricular diastolic parameters are indeterminate.  2. Right ventricular systolic function is normal. The right ventricular size is normal. Mildly increased right ventricular wall thickness.  3. Left atrial size was severely dilated.  4. Right atrial size was mildly dilated.  5. Large pleural effusion in the left lateral region.  6. The mitral valve is abnormal. No evidence of mitral valve regurgitation. No evidence of mitral stenosis.  7. The aortic valve is tricuspid. Aortic valve regurgitation is not visualized. No aortic stenosis is present.  8. The inferior vena cava is dilated in size with >50% respiratory variability, suggesting right atrial pressure of 8 mmHg. Comparison(s): No significant change from prior study. FINDINGS  Left Ventricle: Left ventricular ejection fraction, by estimation, is 65 to 70%. The left ventricle has normal function. The left ventricle has no regional wall motion abnormalities. The left ventricular internal cavity size was  small. There is severe asymmetric left ventricular hypertrophy of the septal segment. Left ventricular diastolic parameters are indeterminate. Right Ventricle: The right ventricular size is normal. Mildly increased right ventricular wall thickness. Right ventricular systolic function is normal. Left Atrium: Left atrial size was severely dilated. Right Atrium: Right atrial size was mildly dilated. Pericardium: Trivial pericardial effusion is present. The pericardial effusion is lateral to the  left ventricle. Mitral Valve: The mitral valve is abnormal. No evidence of mitral valve regurgitation. No evidence of mitral valve stenosis. Tricuspid Valve: The tricuspid valve is normal in structure. Tricuspid valve regurgitation is not demonstrated. No evidence of tricuspid stenosis. Aortic Valve: The aortic valve is tricuspid. Aortic valve regurgitation is not visualized. No aortic stenosis is present. Pulmonic Valve: The pulmonic valve was normal in structure. Pulmonic valve regurgitation is trivial. No evidence of pulmonic stenosis. Aorta: The aortic root and ascending aorta are structurally normal, with no evidence of dilitation. Venous: The inferior vena cava is dilated in size with greater than 50% respiratory variability, suggesting right atrial pressure of 8 mmHg. IAS/Shunts: No atrial level shunt detected by color flow Doppler. Additional Comments: There is a large pleural effusion in the left lateral region.  LEFT VENTRICLE PLAX 2D LVIDd:         3.50 cm LVIDs:         2.30 cm LV PW:         1.40 cm LV IVS:        1.20 cm LVOT diam:     2.40 cm LVOT Area:     4.52 cm  RIGHT VENTRICLE          IVC RV Basal diam:  2.80 cm  IVC diam: 2.50 cm LEFT ATRIUM              Index        RIGHT ATRIUM           Index LA diam:        4.20 cm  1.85 cm/m   RA Area:     25.80 cm LA Vol (A2C):   106.0 ml 46.66 ml/m  RA Volume:   80.70 ml  35.52 ml/m LA Vol (A4C):   128.0 ml 56.34 ml/m LA Biplane Vol: 123.0 ml 54.14 ml/m                        PULMONIC VALVE AORTA                 PR End Diast Vel: 7.40 msec Ao Root diam: 3.80 cm Ao Asc diam:  3.30 cm  SHUNTS Systemic Diam: 2.40 cm Rudean Haskell MD Electronically signed by Rudean Haskell MD Signature Date/Time: 07/06/2022/5:42:32 PM    Final    VAS [place] ABI WITH/WO TBI  Result Date: 07/06/2022  LOWER EXTREMITY DOPPLER STUDY Patient Name:  DASHAUN ONSTOTT  Date of Exam:   07/05/2022 Medical Rec #: [id number]            Accession #:    [id number]  Date of Birth: 03-10-1946            Patient Gender: M Patient Age:   63 years Exam Location:  [facility] Procedure:      VAS [place] ABI WITH/WO TBI Referring Phys: Jerald Kief REGALADO --------------------------------------------------------------------------------  Indications: Peripheral artery disease. High Risk Factors: Hypertension, hyperlipidemia, Diabetes, prior CVA. Other Factors: CKD, CHF, Afib,.  Limitations: Today's exam was limited due to edema and thickened skin.  Comparison Study: No previous exams Performing Technologist: Rogelia Rohrer RVT/RDMS  Examination Guidelines: A complete evaluation includes at minimum, Doppler waveform signals and systolic blood pressure reading at the level of bilateral brachial, anterior tibial, and posterior tibial arteries, when vessel segments are accessible. Bilateral testing is considered an integral part of a complete examination. Photoelectric Plethysmograph (PPG) waveforms and toe systolic pressure readings are included as required and additional duplex testing as needed. Limited examinations for reoccurring indications may be performed as noted.  ABI Findings: +---------+------------------+-----+----------+--------+ Right    Rt Pressure (mmHg)IndexWaveform  Comment  +---------+------------------+-----+----------+--------+ PTA      87                0.70 monophasic         +---------+------------------+-----+----------+--------+ DP       83                0.67 monophasic         +---------+------------------+-----+----------+--------+ Great Toe                       Absent             +---------+------------------+-----+----------+--------+ +---------+------------------+-----+----------+-------+ Left     Lt Pressure (mmHg)IndexWaveform  Comment +---------+------------------+-----+----------+-------+ Brachial 124                                      +---------+------------------+-----+----------+-------+ PTA      99                 0.80 monophasic        +---------+------------------+-----+----------+-------+ DP       120               0.97 monophasic        +---------+------------------+-----+----------+-------+ Great Toe                       Absent            +---------+------------------+-----+----------+-------+  Summary: Right: Resting right ankle-brachial index indicates moderate right lower extremity arterial disease. The right toe-brachial index is absent. Left: The left toe-brachial index is absent. Although ankle brachial indices are within normal limits (0.95-1.29), arterial Doppler waveforms at the ankle suggest some component of arterial occlusive disease. *See table(s) above for measurements and observations.  Electronically signed by Jamelle Haring on 07/06/2022 at 11:16:34 AM.    Final    VAS [place] LOWER EXTREMITY VENOUS (DVT)  Result Date: 07/06/2022  Lower Venous DVT Study Patient Name:  YANKY VANDERBURG  Date of Exam:   07/05/2022 Medical Rec #: [id number]            Accession #:    [id number] Date of Birth: 1946-06-03            Patient Gender: M Patient Age:   51 years Exam Location:  [facility] Procedure:      VAS [place] LOWER EXTREMITY VENOUS (DVT) Referring Phys: Wynetta Fines --------------------------------------------------------------------------------  Indications: Edema.  Risk Factors: Chronic venous insufficiency & edema. Limitations: Body habitus and poor ultrasound/tissue interface. Comparison Study: Previous exam on 10/24/20 was negative for DVT Performing Technologist: Rogelia Rohrer RVT, RDMS  Examination Guidelines: A complete evaluation includes B-mode imaging, spectral Doppler, color Doppler, and power Doppler as needed of all accessible portions of each vessel. Bilateral testing is considered an integral part of a complete examination. Limited examinations for  reoccurring indications may be performed as noted. The reflux portion of the exam is performed with the patient in reverse  Trendelenburg.  +---------+---------------+---------+-----------+----------+-------------------+ RIGHT    CompressibilityPhasicitySpontaneityPropertiesThrombus Aging      +---------+---------------+---------+-----------+----------+-------------------+ CFV      Full           Yes      Yes                                      +---------+---------------+---------+-----------+----------+-------------------+ SFJ      Full                                                             +---------+---------------+---------+-----------+----------+-------------------+ FV Prox  Full           Yes      Yes                                      +---------+---------------+---------+-----------+----------+-------------------+ FV Mid   Full           Yes      Yes                                      +---------+---------------+---------+-----------+----------+-------------------+ FV DistalFull           Yes      Yes                                      +---------+---------------+---------+-----------+----------+-------------------+ PFV      Full                                                             +---------+---------------+---------+-----------+----------+-------------------+ POP      Full           Yes      Yes                                      +---------+---------------+---------+-----------+----------+-------------------+ PTV      Full                                         Not well visualized +---------+---------------+---------+-----------+----------+-------------------+ PERO     Full                                         Not well visualized +---------+---------------+---------+-----------+----------+-------------------+   +---------+---------------+---------+-----------+----------+-------------------+ LEFT     CompressibilityPhasicitySpontaneityPropertiesThrombus Aging       +---------+---------------+---------+-----------+----------+-------------------+ CFV      Full  Yes      Yes                                      +---------+---------------+---------+-----------+----------+-------------------+ SFJ      Full                                                             +---------+---------------+---------+-----------+----------+-------------------+ FV Prox  Full           Yes      Yes                                      +---------+---------------+---------+-----------+----------+-------------------+ FV Mid   Full           Yes      Yes                                      +---------+---------------+---------+-----------+----------+-------------------+ FV DistalFull           Yes      Yes                                      +---------+---------------+---------+-----------+----------+-------------------+ PFV      Full                                                             +---------+---------------+---------+-----------+----------+-------------------+ POP      Full                                                             +---------+---------------+---------+-----------+----------+-------------------+ PTV      Full                                         Not well visualized +---------+---------------+---------+-----------+----------+-------------------+ PERO     Full                                         Not well visualized +---------+---------------+---------+-----------+----------+-------------------+     Summary: BILATERAL: - No evidence of deep vein thrombosis seen in the lower extremities, bilaterally. -No evidence of popliteal cyst, bilaterally.   *See table(s) above for measurements and observations. Electronically signed by Jamelle Haring on 07/06/2022 at 11:16:25 AM.    Final     Cardiac Studies   Echo 07/06/22: 1. Left ventricular ejection fraction, by estimation, is 65 to 70%. The  left  ventricle  has normal function. The left ventricle has no regional  wall motion abnormalities. There is severe asymmetric left ventricular  hypertrophy of the septal segment. Left   ventricular diastolic parameters are indeterminate.   2. Right ventricular systolic function is normal. The right ventricular  size is normal. Mildly increased right ventricular wall thickness.   3. Left atrial size was severely dilated.   4. Right atrial size was mildly dilated.   5. Large pleural effusion in the left lateral region.   6. The mitral valve is abnormal. No evidence of mitral valve  regurgitation. No evidence of mitral stenosis.   7. The aortic valve is tricuspid. Aortic valve regurgitation is not  visualized. No aortic stenosis is present.   8. The inferior vena cava is dilated in size with >50% respiratory  variability, suggesting right atrial pressure of 8 mmHg.   Patient Profile     76 y.o. male with a hx of paroxysmal atrial fibrillation, HFpEF, CKD 2, COPD, BPH who is being seen today for the evaluation of nonsustained VT.   Assessment & Plan    Asymptomatic NSVT - PVC mediated NSVT - in the setting of PE and UTI - increased coreg to 6.25 mg BID - no antiarrhythmic recommended - hs troponin mild and flat, inconsistent with ACS - coreg recently D/C'd, unclear why, but restarted in clinic 06/03/22 with Dr. Ellyn Hack  - has not tolerated cardizem due to fatigue and worsening edema, amiodarone was previously stopped - no room to titrate BB given ON heart rates in the 50s - telemetry with PVCs, couplets, triplets, and NSVT generally 14 beats or less, one run of 23 beats   Hypokalemia  - K 2.9, Mg 1.4 - improved - reports of poor PO intake prior to admission   Permanent Afib PE - large volume PE in the right main PA and lobar LU and LL pulmonary arteries Need for Chronic anticoagulation Rate controlled with coreg, did not tolerate cardizem  Decisions regarding anticoagulation  historically have been difficult due to TIA hx but also subdural hematoma and frequent falls Appreciate neurosurgery input - repeat head CT with resolution of prior SDH - OK for heparin Heparin transitioning to eliquis today Echo negative for right heart strain - consider HH PT   Hypertensive heart disease CKD stage III Chronic diastolic heart failure Chronic venous insufficiency Not on ARB due to renal function  sCr stable and at baseline Will need to restart diuretic prior to discharge No SGLT2i given UTI/hematuria - monitor BP to decide on additional medications   Cardiology follow up has been arranged.      For questions or updates, please contact [place] Please consult [email or web address] for contact info under        Signed, Ledora Bottcher, PA  07/07/2022, 7:43 AM    Personally seen and examined. Agree with APP above with the following comments: 76 yo M with permanent AF with no AC due to SDH.  Started on AE for large PE  Patient notes no symptoms He has noted that he has had ectopy since before establish with Dr. Ellyn Hack; he used to care a EKG strip with hip so he would not get sent to to OR if he ended up in the hospital for non cardiac reasons.  Had had no issues with AC since start.  Tele:AF with PVCs; two runs greater that 5 beats. IRIR rhythm; otherwise as above  Would recommend  DOAC transition as above Coreg increase K goal 4,  Mg goal 2 He will like have at least some PVCs gven his prior history of PVCs  Rudean Haskell, MD [place]  [address] [place], [place] [address] [phone]  3:06 PM

## 2022-07-07 NOTE — TOC CM/SW Note (Signed)
    Durable Medical Equipment  (From admission, onward)           Start     Ordered   07/07/22 1434  For home use only DME standard manual wheelchair with seat cushion  Once       Comments: Patient suffers from recent decline in their functional status which impairs their ability to perform daily activities like ambulating  in the home.  A cane  will not resolve issue with performing activities of daily living. A wheelchair will allow patient to safely perform daily activities. Patient can safely propel the wheelchair in the home or has a caregiver who can provide assistance. Length of need lifetime . Accessories: elevating leg rests (ELRs), wheel locks, extensions and anti-tippers.  Seat and back cushions   07/07/22 1433

## 2022-07-07 NOTE — Progress Notes (Signed)
Patient  refused his lab work this AM,On call J Daniels,NP informed,will continue to monitor.

## 2022-07-08 DIAGNOSIS — I4821 Permanent atrial fibrillation: Secondary | ICD-10-CM

## 2022-07-08 DIAGNOSIS — G9341 Metabolic encephalopathy: Secondary | ICD-10-CM | POA: Diagnosis not present

## 2022-07-08 LAB — URINE CULTURE: Culture: 50000 — AB

## 2022-07-08 LAB — MAGNESIUM: Magnesium: 1.7 mg/dL (ref 1.7–2.4)

## 2022-07-08 LAB — BASIC METABOLIC PANEL
Anion gap: 9 (ref 5–15)
BUN: 9 mg/dL (ref 8–23)
CO2: 23 mmol/L (ref 22–32)
Calcium: 8.4 mg/dL — ABNORMAL LOW (ref 8.9–10.3)
Chloride: 107 mmol/L (ref 98–111)
Creatinine, Ser: 1.3 mg/dL — ABNORMAL HIGH (ref 0.61–1.24)
GFR, Estimated: 57 mL/min — ABNORMAL LOW (ref >60)
Glucose, Bld: 100 mg/dL — ABNORMAL HIGH (ref 70–99)
Potassium: 3.6 mmol/L (ref 3.5–5.1)
Sodium: 139 mmol/L (ref 135–145)

## 2022-07-08 MED ORDER — APIXABAN 5 MG PO TABS: 5.0000 mg | ORAL_TABLET | Freq: Two times a day (BID) | ORAL | 3 refills | Status: DC

## 2022-07-08 MED ORDER — POTASSIUM CHLORIDE ER 10 MEQ PO TBCR: 20.0000 meq | EXTENDED_RELEASE_TABLET | Freq: Every day | ORAL | 3 refills | Status: DC

## 2022-07-08 MED ORDER — APIXABAN 5 MG PO TABS: 10.0000 mg | ORAL_TABLET | Freq: Two times a day (BID) | ORAL | 0 refills | Status: DC

## 2022-07-08 MED ORDER — TAMSULOSIN HCL 0.4 MG PO CAPS: 0.4000 mg | ORAL_CAPSULE | Freq: Every day | ORAL | 1 refills | Status: DC

## 2022-07-08 MED ORDER — SULFAMETHOXAZOLE-TRIMETHOPRIM 800-160 MG PO TABS
1.0000 | ORAL_TABLET | Freq: Two times a day (BID) | ORAL | Status: DC
  Administered 2022-07-08: 1 via ORAL
  Filled 2022-07-08: qty 1

## 2022-07-08 MED ORDER — CARVEDILOL 6.25 MG PO TABS: 6.2500 mg | ORAL_TABLET | Freq: Two times a day (BID) | ORAL | 3 refills | Status: DC

## 2022-07-08 MED ORDER — SULFAMETHOXAZOLE-TRIMETHOPRIM 800-160 MG PO TABS: 1.0000 | ORAL_TABLET | Freq: Two times a day (BID) | ORAL | 0 refills | Status: AC

## 2022-07-08 NOTE — Progress Notes (Signed)
Removed IV-CDI. Reviewed d/c paperwork with patient and daughter. Answered all questions. Wheeled stable patient and belongings, including wheelchair, to main entrance where he was picked up by his daughter.

## 2022-07-08 NOTE — Evaluation (Signed)
Occupational Therapy Evaluation Patient Details Name: [name] MRN: [id number] DOB: 1946/06/28 Today's Date: 07/08/2022   History of Present Illness 76 year old admitted 07/04/22 due to worsening confusion and evaluation for urinary symptoms; found to have Bilateral PE, acute hypoxic respiratory failure.PMHx: PAF not on anticoagulation secondary to frequent fall, subdural hematoma secondary to fall 04/20/2022, chronic ambulation dysfunction uses roller walker, COPD, prostate cancer status post prostatectomy, BPH, COPD, chronic heart failure , CKD stage II, CVA.   Clinical Impression   PTA, pt lives with family, ambulatory with RW, and receives assist for showering tasks from [facility] aide. Pt currently working with HHPT/OT. Pt presents now with deficits in cognition, dynamic standing balance and endurance. Pt expresses frustration with HH therapies then acute therapies, reporting someone "always wants something from me and telling me how I feel". Difficult to redirect and build rapport to address pt concerns at this time. Overall, pt able to mobilize in room using RW at min guard (though poor posture and effortful), Setup for UB ADL and Min-Mod A for LB ADLs. Provided energy conservation handout for pt review later. If pt agreeable, will follow acutely and rec HHOT continuation at DC as pt would certainly benefit.   SpO2 97% on RA post activity     Recommendations for follow up therapy are one component of a multi-disciplinary discharge planning process, led by the attending physician.  Recommendations may be updated based on patient status, additional functional criteria and insurance authorization.   Follow Up Recommendations  Home health OT     Assistance Recommended at Discharge Intermittent Supervision/Assistance  Patient can return home with the following A little help with bathing/dressing/bathroom;Direct supervision/assist for medications management;Direct supervision/assist for  financial management    Functional Status Assessment  Patient has had a recent decline in their functional status and demonstrates the ability to make significant improvements in function in a reasonable and predictable amount of time.  Equipment Recommendations  None recommended by OT    Recommendations for Other Services       Precautions / Restrictions Precautions Precautions: Fall Precaution Comments: monitor O2 Restrictions Weight Bearing Restrictions: No      Mobility Bed Mobility Overal bed mobility: Modified Independent                  Transfers Overall transfer level: Needs assistance Equipment used: Rolling walker (2 wheels) Transfers: Sit to/from Stand, Bed to chair/wheelchair/BSC Sit to Stand: Min guard, From elevated surface     Step pivot transfers: Min guard     General transfer comment: significantly elevated bed      Balance Overall balance assessment: Needs assistance Sitting-balance support: No upper extremity supported, Feet supported Sitting balance-Leahy Scale: Fair     Standing balance support: Bilateral upper extremity supported Standing balance-Leahy Scale: Poor                             ADL either performed or assessed with clinical judgement   ADL Overall ADL's : Needs assistance/impaired Eating/Feeding: Independent   Grooming: Set up;Sitting   Upper Body Bathing: Set up   Lower Body Bathing: Minimal assistance;Sit to/from stand   Upper Body Dressing : Set up   Lower Body Dressing: Moderate assistance Lower Body Dressing Details (indicate cue type and reason): assist for socks, shoes Toilet Transfer: Min guard;Ambulation;Rolling walker (2 wheels)   Toileting- Clothing Manipulation and Hygiene: Minimal assistance;Sit to/from stand;Sitting/lateral lean       Functional mobility during  ADLs: Min guard;Rolling walker (2 wheels)       Vision Ability to See in Adequate Light: 0 Adequate Patient Visual  Report: No change from baseline Vision Assessment?: No apparent visual deficits     Perception     Praxis      Pertinent Vitals/Pain Pain Assessment Pain Assessment: No/denies pain     Hand Dominance Right   Extremity/Trunk Assessment Upper Extremity Assessment Upper Extremity Assessment: Overall WFL for tasks assessed   Lower Extremity Assessment Lower Extremity Assessment: Defer to PT evaluation   Cervical / Trunk Assessment Cervical / Trunk Assessment: Kyphotic   Communication Communication Communication: No difficulties   Cognition Arousal/Alertness: Awake/alert Behavior During Therapy: WFL for tasks assessed/performed Overall Cognitive Status: Impaired/Different from baseline Area of Impairment: Following commands, Safety/judgement, Problem solving                       Following Commands: Follows one step commands with increased time, Follows one step commands consistently Safety/Judgement: Decreased awareness of deficits   Problem Solving: Slow processing General Comments: flat affect, follows directions, contradictory statements throughout and endorses feeling frustrated by being in hospital (reports being here a week but has only been 3 days). initially reports frustration w/ HH therapies coming all the time, but then reports "not directed towards you because i dont know you" then later expressed frustration with hospital therapists coming in at all the wrong times but not clear when asked if he wanted acute therapy to stop, he reports "did I say that? why would you think that"; difficult to build rapport     General Comments       Exercises     Shoulder Instructions      Home Living Family/patient expects to be discharged to:: Private residence Living Arrangements: Children Available Help at Discharge: Family;Available PRN/intermittently Type of Home: House Home Access: Stairs to enter [employer] of Steps: 3 Entrance Stairs-Rails:  Left Home Layout: One level     Bathroom Shower/Tub: Tub/shower unit;Curtain;Sponge bathes at baseline   [employer]: Standard     Home Equipment: Conservation officer, nature (2 wheels);Toilet riser;Shower seat;Hospital bed (lift chair)          Prior Functioning/Environment Prior Level of Function : Needs assist             Mobility Comments: Reports he has been working with PT and OT at home. Walking with RW. ADLs Comments: reports assist w/ showering from aide        OT Problem List: Decreased activity tolerance;Impaired balance (sitting and/or standing);Decreased knowledge of use of DME or AE;Decreased cognition;Decreased safety awareness      OT Treatment/Interventions: Self-care/ADL training;Therapeutic exercise;Energy conservation;DME and/or AE instruction;Therapeutic activities;Patient/family education;Balance training    OT Goals(Current goals can be found in the care plan section) Acute Rehab OT Goals Patient Stated Goal: go home, get stronger OT Goal Formulation: With patient Time For Goal Achievement: 07/22/22 Potential to Achieve Goals: Fair  OT Frequency: Min 2X/week    Co-evaluation              AM-PAC OT "6 Clicks" Daily Activity     Outcome Measure Help from another person eating meals?: None Help from another person taking care of personal grooming?: A Little Help from another person toileting, which includes using toliet, bedpan, or urinal?: A Little Help from another person bathing (including washing, rinsing, drying)?: A Little Help from another person to put on and taking off regular upper body clothing?: A  Little Help from another person to put on and taking off regular lower body clothing?: A Lot 6 Click Score: 18   End of Session Equipment Utilized During Treatment: Rolling walker (2 wheels)  Activity Tolerance: Other (comment) (limited by pt hyperverbose regarding frustrations) Patient left: in chair;with call bell/phone within reach  OT  Visit Diagnosis: Other abnormalities of gait and mobility (R26.89);Unsteadiness on feet (R26.81)                Time: 9643-8381 OT Time Calculation (min): 39 min Charges:  OT General Charges $OT Visit: 1 Visit OT Evaluation $OT Eval Low Complexity: 1 Low OT Treatments $Self Care/Home Management : 8-22 mins $Therapeutic Activity: 8-22 mins  Malachy Chamber, OTR/L Acute Rehab Services Office: [phone]   Layla Maw 07/08/2022, 8:26 AM

## 2022-07-08 NOTE — Plan of Care (Signed)

## 2022-07-08 NOTE — TOC Progression Note (Signed)
Transition of Care (TOC) - Progression Note   Called [name]) for wheelchair.   Lillia Carmel with [facility]  Patient Details  Name: [name] MRN: [id number] Date of Birth: 23-Dec-1945  Transition of Care [facility]) CM/SW Contact  Kha Hari, Edson Snowball, RN Phone Number: 07/08/2022, 12:40 PM  Clinical Narrative:       Expected Discharge Plan: [place] Barriers to Discharge: Continued Medical Work up  Expected Discharge Plan and Services Expected Discharge Plan: [place]   Discharge Planning Services: CM Consult Post Acute Care Choice: [place] arrangements for the past 2 months: Single Family Home Expected Discharge Date: 07/08/22               DME Arranged:  (await PT evaluation) DME Agency: NA       HH Arranged: PT, OT, Nurse's Aide [place] Agency: [facility] Date [place] Agency Contacted: 07/07/22 Time [place]: 1006 Representative spoke with at [place]: [place] Determinants of Health ([place]) Interventions Housing Interventions: Intervention Not Indicated  Readmission Risk Interventions     No data to display

## 2022-07-08 NOTE — Care Management Important Message (Signed)
Important Message  Patient Details  Name: Demontray Franta MRN: [id number] Date of Birth: Feb 01, 1946   Medicare Important Message Given:  Yes     Shelda Altes 07/08/2022, 12:54 PM

## 2022-07-08 NOTE — Progress Notes (Addendum)
Rounding Note    Patient Name: [name] Date of Encounter: 07/08/2022  [place] Cardiologist: Glenetta Hew, MD   Subjective   No acute overnight events. He continues to have frequent PVC and short runs of NSVT but is asymptomatic with this. He denies any palpitations, chest pain, or shortness of breath.  Inpatient Medications    Scheduled Meds:  apixaban  10 mg Oral BID   Followed by   Derrill Memo ON 07/14/2022] apixaban  5 mg Oral BID   carvedilol  6.25 mg Oral BID WC   furosemide  40 mg Oral Daily   potassium chloride  20 mEq Oral Daily   QUEtiapine  12.5 mg Oral QHS   rosuvastatin  40 mg Oral QPM   tamsulosin  0.4 mg Oral QPC supper   Continuous Infusions:  cefTRIAXone (ROCEPHIN)  IV Stopped (07/07/22 1746)   PRN Meds: acetaminophen **OR** acetaminophen, albuterol, ondansetron **OR** ondansetron (ZOFRAN) IV, mouth rinse   Vital Signs    Vitals:   07/06/22 1948 07/07/22 0847 07/07/22 1827 07/07/22 2213  BP: (!) 146/75 130/75 133/73 134/82  Pulse: 72 65 (!) 56 62  Resp: '20 17 17 18  '$ Temp: 97.7 F (36.5 C) (!) 97.5 F (36.4 C) 97.8 F (36.6 C) 97.6 F (36.4 C)  TempSrc: Oral Oral Oral Oral  SpO2: 100% 100% 98% 100%  Weight:      Height:        Intake/Output Summary (Last 24 hours) at 07/08/2022 0727 Last data filed at 07/08/2022 0500 Gross per 24 hour  Intake 440.66 ml  Output 1500 ml  Net -1059.34 ml      07/05/2022    1:20 AM 06/25/2022    1:35 PM 06/03/2022   12:09 PM  Last 3 Weights  Weight (lbs) 227 lb 4.7 oz 225 lb 234 lb 12.8 oz  Weight (kg) 103.1 kg 102.059 kg 106.505 kg      Telemetry    Atrial fibrillation with frequent PVC as well as ventricular couplets and triplets. Also has occasional runs of NSVT (longest run 13 beats). Resting rates in the 60s. Rates will occasionally increase to the 80 (suspect this is when he is exerting himself more).  - Personally Reviewed  ECG    No new ECG tracing today. - Personally  Reviewed  Physical Exam   GEN: African-American male resting comfortably in no acute distress.   Neck: No JVD. Cardiac: Irregularly irregular rhythm with normal rate. No murmurs, rubs, or gallops.  Respiratory: No increased work of breathing. Clear to auscultation bilaterally. No wheezes, rhonchi, or rales. GI: Soft, non-distended, and non-tender. MS: 1+ pitting edema of bilateral lower extremities (left > right). No deformities. Skin: Warm and dry. Neuro:  No focal deficits. Psych: Normal affect. Responds appropriately.  Labs    High Sensitivity Troponin:   Recent Labs  Lab 07/06/22 1825 07/06/22 2003  TROPONINIHS 37* 31*     Chemistry Recent Labs  Lab 07/04/22 1149 07/05/22 0216 07/05/22 2126 07/06/22 0150 07/06/22 1543 07/07/22 1339  NA 140   < > 139 137 137 138  K 3.3*   < > 3.5 2.9* 3.8 4.3  CL 104   < > 104 103 104 103  CO2 26   < > '27 25 25 25  '$ GLUCOSE 114*   < > 111* 102* 119* 112*  BUN 11   < > '14 13 12 12  '$ CREATININE 1.41*   < > 1.58* 1.46* 1.59* 1.44*  CALCIUM 8.5*   < >  8.0* 7.9* 8.2* 8.3*  MG 1.4*  --  1.4* 1.9  --   --   PROT 7.6  --   --   --   --   --   ALBUMIN 2.4*  --   --   --   --   --   AST 26  --   --   --   --   --   ALT 13  --   --   --   --   --   ALKPHOS 132*  --   --   --   --   --   BILITOT 0.6  --   --   --   --   --   GFRNONAA 52*   < > 45* 50* 45* 51*  ANIONGAP 10   < > '8 9 8 10   '$ < > = values in this interval not displayed.    Lipids No results for input(s): "CHOL", "TRIG", "HDL", "LABVLDL", "LDLCALC", "CHOLHDL" in the last 168 hours.  Hematology Recent Labs  Lab 07/04/22 1149 07/06/22 0150 07/07/22 1339  WBC 6.3 6.1 4.0  RBC 5.42 4.44 5.23  HGB 14.3 12.0* 13.7  HCT 45.7 36.4* 45.1  MCV 84.3 82.0 86.2  MCH 26.4 27.0 26.2  MCHC 31.3 33.0 30.4  RDW 14.6 14.5 14.7  PLT 258 190 150   Thyroid No results for input(s): "TSH", "FREET4" in the last 168 hours.  BNPNo results for input(s): "BNP", "PROBNP" in the last 168 hours.   DDimer No results for input(s): "DDIMER" in the last 168 hours.   Radiology    ECHOCARDIOGRAM COMPLETE  Result Date: 07/06/2022    ECHOCARDIOGRAM REPORT   Patient Name:   [name] Date of Exam: 07/06/2022 Medical Rec #:  [id number]           Height:       73.0 in Accession #:    [id number]          Weight:       227.3 lb Date of Birth:  June 22, 1946           BSA:          2.272 m Patient Age:    76 years            BP:           115/66 mmHg Patient Gender: M                   HR:           60 bpm. Exam Location:  Inpatient Procedure: 2D Echo, Cardiac Doppler and Color Doppler Indications:    abnormal ecg  History:        Patient has prior history of Echocardiogram examinations, most                 recent 01/25/2022. COPD and chronic kidney disease,                 Arrythmias:Atrial Fibrillation; Risk Factors:Diabetes,                 Hypertension and Dyslipidemia.  Sonographer:    Johny Chess RDCS Referring Phys: [phone] BELKYS A REGALADO IMPRESSIONS  1. Left ventricular ejection fraction, by estimation, is 65 to 70%. The left ventricle has normal function. The left ventricle has no regional wall motion abnormalities. There is severe asymmetric left ventricular hypertrophy of the septal segment. Left  ventricular diastolic parameters are indeterminate.  2. Right ventricular systolic function is normal. The right ventricular size is normal. Mildly increased right ventricular wall thickness.  3. Left atrial size was severely dilated.  4. Right atrial size was mildly dilated.  5. Large pleural effusion in the left lateral region.  6. The mitral valve is abnormal. No evidence of mitral valve regurgitation. No evidence of mitral stenosis.  7. The aortic valve is tricuspid. Aortic valve regurgitation is not visualized. No aortic stenosis is present.  8. The inferior vena cava is dilated in size with >50% respiratory variability, suggesting right atrial pressure of 8 mmHg. Comparison(s): No significant  change from prior study. FINDINGS  Left Ventricle: Left ventricular ejection fraction, by estimation, is 65 to 70%. The left ventricle has normal function. The left ventricle has no regional wall motion abnormalities. The left ventricular internal cavity size was small. There is severe asymmetric left ventricular hypertrophy of the septal segment. Left ventricular diastolic parameters are indeterminate. Right Ventricle: The right ventricular size is normal. Mildly increased right ventricular wall thickness. Right ventricular systolic function is normal. Left Atrium: Left atrial size was severely dilated. Right Atrium: Right atrial size was mildly dilated. Pericardium: Trivial pericardial effusion is present. The pericardial effusion is lateral to the left ventricle. Mitral Valve: The mitral valve is abnormal. No evidence of mitral valve regurgitation. No evidence of mitral valve stenosis. Tricuspid Valve: The tricuspid valve is normal in structure. Tricuspid valve regurgitation is not demonstrated. No evidence of tricuspid stenosis. Aortic Valve: The aortic valve is tricuspid. Aortic valve regurgitation is not visualized. No aortic stenosis is present. Pulmonic Valve: The pulmonic valve was normal in structure. Pulmonic valve regurgitation is trivial. No evidence of pulmonic stenosis. Aorta: The aortic root and ascending aorta are structurally normal, with no evidence of dilitation. Venous: The inferior vena cava is dilated in size with greater than 50% respiratory variability, suggesting right atrial pressure of 8 mmHg. IAS/Shunts: No atrial level shunt detected by color flow Doppler. Additional Comments: There is a large pleural effusion in the left lateral region.  LEFT VENTRICLE PLAX 2D LVIDd:         3.50 cm LVIDs:         2.30 cm LV PW:         1.40 cm LV IVS:        1.20 cm LVOT diam:     2.40 cm LVOT Area:     4.52 cm  RIGHT VENTRICLE          IVC RV Basal diam:  2.80 cm  IVC diam: 2.50 cm LEFT ATRIUM               Index        RIGHT ATRIUM           Index LA diam:        4.20 cm  1.85 cm/m   RA Area:     25.80 cm LA Vol (A2C):   106.0 ml 46.66 ml/m  RA Volume:   80.70 ml  35.52 ml/m LA Vol (A4C):   128.0 ml 56.34 ml/m LA Biplane Vol: 123.0 ml 54.14 ml/m                        PULMONIC VALVE AORTA                 PR End Diast Vel: 7.40 msec Ao Root diam: 3.80 cm Ao Asc diam:  3.30 cm  SHUNTS Systemic Diam:  2.40 cm Rudean Haskell MD Electronically signed by Rudean Haskell MD Signature Date/Time: 07/06/2022/5:42:32 PM    Final     Cardiac Studies   Echocardiogram 07/06/2022: Impressions: 1. Left ventricular ejection fraction, by estimation, is 65 to 70%. The  left ventricle has normal function. The left ventricle has no regional  wall motion abnormalities. There is severe asymmetric left ventricular  hypertrophy of the septal segment. Left   ventricular diastolic parameters are indeterminate.   2. Right ventricular systolic function is normal. The right ventricular  size is normal. Mildly increased right ventricular wall thickness.   3. Left atrial size was severely dilated.   4. Right atrial size was mildly dilated.   5. Large pleural effusion in the left lateral region.   6. The mitral valve is abnormal. No evidence of mitral valve  regurgitation. No evidence of mitral stenosis.   7. The aortic valve is tricuspid. Aortic valve regurgitation is not  visualized. No aortic stenosis is present.   8. The inferior vena cava is dilated in size with >50% respiratory  variability, suggesting right atrial pressure of 8 mmHg.   Comparison(s): No significant change from prior study.   Patient Profile     76 y.o. male with a history of chronic HFpEF, permanent atrial fibrillation, hyperlipidemia, type 2 diabetes mellitus,  chronic venous insufficiency, COPD, CVA, subdural hematoma in 03/2022, and BPH who was admitted on 07/04/2022 for acute metabolic encephalopathy secondary to UTI after  presenting with generalized weakness and confusion. Work-up also revealed acute PE. Cardiology was consulted for further evaluation of non-sustained VT.  Assessment & Plan    Non-Sustained VT Frequent PVCs He has a history of PVC and NSVT but may be somewhat worse now in setting of acute PE and UTI. Initially noted to have hypokalemia (potassium as low as 2.9) and hypomagnesemia (magnesium as low as 1.4). Both supplemented with improvement. Echo showed LVEF of 65-70%. - Reviewed telemetry. Continues to have frequent PVCs as well as some ventricular couplets/ triplets, and occasional short runs of NSVT (longest run 13 beats). - Asymptomatic with this. - Continue Coreg 6.'25mg'$  twice daily for now. Unable to up-titrate this due to baseline heart rates in the 60s. Could consider transitioning to Metoprolol to see if this help suppress ectopy more. Will discuss with MD. He has not been able to tolerate Cardizem in the past due to fatigue and worsening edema.   Permanent Atrial Fibrillation Rates well controlled. - Continue Coreg 6.'25mg'$  twice daily as above. - Decisions regarding anticoagulation has been difficult. Previously on Eliquis but this was stopped in 03/2022 in setting of subdural hematoma. However, now admitted with acute PE. Head CT showed previously seen subdural hematoma had resolved. He was initially started on IV Heparin and tolerated this well so was restarted on Eliquis on 07/07/2022. Currently on treatment dose for PE.  Chronic HFpEF Chronic Venous Insufficiency Echo this admission showed LVEF of 65-70% with no regional wall motion abnormalities and severe asymmetric LVH, normal RV, biatrial enlargement (left > right), and a large left pleural effusion. He was noted to have significant lower extremity edema on presentation. Lower extremity dopplers were negative for DVT. Felt to be due to chronic venous insufficiency. He received a couple of doses of IV Lasix and then was started on PO  Lasix on 12/18. - He has chronic lower extremity edema but otherwise appears euvolemic on exam. - Continue PO Lasix '40mg'$  daily. - Vascular Surgery was consulted and recommended conservative management of lower  extremity edema and recommended compression and elevation of legs. Continue.  PAD Patient noted to have cold extremities this admission. ABI showed moderate right lower extremity arterial disease on the right with absent TBI and normal ABI on the left but some component of arterial occlusive disease noted on arterial doppler waveforms. - Vascular Surgery was consulted and recommended conservative management. Not on aspirin given need for DOAC. Continue Crestor '40mg'$  daily.  Hypertension BP well controlled.  - Continue Coreg as above.  CKD Stage III Creatinine stable at 1.30 today. Baseline around 1.4 to 1.6. - Continue to monitor.  Otherwise, per primary team: - Acute metabolic encephalopathy - UTI - Acute PE - History of subdural hematoma - Lung nodules - BPH  For questions or updates, please contact [place] Please consult [email or web address] for contact info under        Signed, Darreld Mclean, PA-C  07/08/2022, 7:27 AM     Personally seen and examined. Agree with APP above with the following comments: Patient is still asymptomatic from PVCs. He reminds me of his long history of this without having symptoms. No issues ambulating, asks why I am still seeing him Permanent A fib with Ectopy. Continue therapy as above. Has 07/18/22 f/u. Reasonable DC  Rudean Haskell, MD [facility] [address], [id number] [place],  [id number] [phone]  12:13 PM

## 2022-07-08 NOTE — Discharge Summary (Signed)
Physician Discharge Summary   Patient: [name] MRN: [id number] DOB: [birth year]  Admit date:     07/04/2022  Discharge date: 07/08/22  Discharge Physician: Elmarie Shiley   PCP: Biagio Borg, MD   Recommendations at discharge:   Follow up for resolution of UTI.  Follow up for PE.  Needs follow up with Urology   Discharge Diagnoses: Principal Problem:   UTI (urinary tract infection) Active Problems:   COPD (chronic obstructive pulmonary disease) (HCC)   Benign essential tremor   Chronic kidney disease, stage 3b (HCC)   Acute lower UTI   Bilateral pulmonary embolism (HCC)  Resolved Problems:   * No resolved hospital problems. *  Hospital Course: 76 year old with past medical history significant for PAF not on anticoagulation secondary to frequent fall, subdural hematoma secondary to fall 04/20/2022, chronic ambulation dysfunction uses roller walker, COPD, prostate cancer status post prostatectomy, BPH, COPD, chronic heart failure preserved ejection fraction, CKD stage II, CVA brought by family due to worsening confusion and evaluation for urinary symptoms.   Patient presented with generalized weakness, confusion and agitation for the last 2 weeks.  He has become more unsteady on his feet.  Recent diagnosed with E. coli UTI on December 6 and started on ampicillin.  Patient has not been able to start taking antibiotics due to decreased oral intake and confusion.  Family noticed blood in the urine.   CT chest was done to evaluate pulmonary nodule, he was found to have Large volume pulmonary embolism in the right main pulmonary artery and lobar left upper and lower lobe pulmonary arteries. No findings to suggest right heart strain.  Repeated CT head showed resolution of prior subdural hematoma.  Dr. Claria Dice discussed with neurosurgery on-call and okay to proceed with heparin drip.    Assessment and Plan: 1-Bilateral PE, acute hypoxic respiratory failure oxygen saturation  on room air 86%. -CT chest : Large volume pulmonary embolism in the right main pulmonary artery and lobar left upper and lower lobe pulmonary arteries. -Continue with heparin gtt for 48 hours to make sure no new focal deficit.  -ECHO RV function.  Doppler: Negative for DVT.  -98 % Saturation is stable on 2 L of oxygen -he has been tolerating Eliquis.  Risk benefit of anticoagulation discussed with daughter.    2-Subdural Hematoma;  -Recent diagnosed with subdural hematoma due to fall on 9/15. -Progression of subdural hematoma, and Headaches 9/28; admitted to the hospital, he underwent diagnostic cerebral angiogram and attempt of right MMA embolization but procedure was aborted due to likelihood of increased risk of a stroke and other significant morbidity. -CT head done 10/18: Interval decrease in size of right cerebral convexity subdural hematoma, now measuring up to 5 mm, previously 10 mm.  -CT head 12/16: No acute intracranial abnormalities.  Case discussed with neurosurgeon on call, ok to start heparin.  No new deficit.  Risk benefit of anticoagulation discussed with daughter    3-Acute Metabolic Encephalopathy;  In setting of UTI.  Delirium precaution.  Improved.    4-Incidental lungs nodule, CT chest showed PE.  Small right pleural effusion, partially loculated. Loculated fluid accounts for the radiographic appearance. No suspicious pulmonary nodules.     5-UTI, hematuria;  UA with more than 50 WBC,  Urine culture: 50 K Proteous Penneri Plan to discharge on Bactrim for 5 days. Proteous sensitive to Bactrim.  Hematuria resolved.    6-Hypokalemia; Replaced.    7-Non sustain VT: K low this am, replete orally and IV.  Repeat Bmet this afternoon. Mg 1.9 improved. Give another dose of IV mg. ECHO Normal EF.  Asymptomatic.  On Coreg.  Another episode of VT asymptomatic. He has received potassium and mg supplement. Will give another 20 meq of Potasium.  Cardiology consulted. Plan t  increase coreg to 6.25 mg BID Stable for discharge   8-Cold extremities; PVD:  Daughter notice worsening cold feet. After removing stocking compression, cold extremities  improved. Patient denies feet pain.  ABI: Resting right ankle-brachial index indicates moderate right lower  extremity arterial disease. The right toe-brachial index is absent. Left: The left toe-brachial index is absent.  Although ankle brachial indices are within normal limits (0.95-1.29), arterial Doppler waveforms at the ankle suggest some component of arterial  occlusive disease.  Appreciate Vascular evaluation: recommend LDL less than 100, A1c less 7, aspirin and atorvastatin.    9-Acute on chronic heart failure preserved ejection fraction: He has significant lower extremity edema.  Will hold Lasix due to increased creatinine.  He might need albumin and Lasix tomorrow. ECHO Ef 65 %   10-Chronic A-fib Continue with Coreg. BPH; Started on flomax.  Needs follow up with urology  Emptying bladder.    CKD stage II;  1.3--1.5 Monitor   Estimated body mass index is 29.99 kg/m as calculated from the following:   Height as of this encounter: _0  (1.854 m).   Weight as of this encounter: 103.1 kg.          Consultants: Cardiology, vascular.  Procedures performed: ECHO, doppler Disposition: Home Diet recommendation:  Discharge Diet Orders (From admission, onward)     Start     Ordered   07/08/22 0000  Diet - low sodium heart healthy        07/08/22 1134           Cardiac diet DISCHARGE MEDICATION: Allergies as of 07/08/2022       Reactions   Ciprofloxacin Other (See Comments)   All over weakness        Medication List     STOP taking these medications    ampicillin 500 MG capsule Commonly known as: PRINCIPEN   lidocaine 5 % Commonly known as: Lidoderm       TAKE these medications    Accu-Chek Guide Me w/Device Kit Use as directed twice per day E11.9   Accu-Chek Guide test  strip Generic drug: glucose blood Use as instructed twice per day E11.9   apixaban 5 MG Tabs tablet Commonly known as: ELIQUIS Take 2 tablets (10 mg total) by mouth 2 (two) times daily for 7 days.   apixaban 5 MG Tabs tablet Commonly known as: ELIQUIS Take 1 tablet (5 mg total) by mouth 2 (two) times daily. Start taking on: July 14, 2022   carvedilol 6.25 MG tablet Commonly known as: COREG Take 1 tablet (6.25 mg total) by mouth 2 (two) times daily with a meal. What changed:  medication strength how much to take   furosemide 40 MG tablet Commonly known as: LASIX TAKE 1 TABLET BY MOUTH DAILY .  MAY TAKE AN ADDITIONAL 1 TABLET  BY MOUTH IF NEEDED FOR SWELLING   OneTouch Delica Plus [id number] Misc USE ONCE DAILY   OneTouch Delica Plus Lancing Misc   potassium chloride 10 MEQ tablet Commonly known as: KLOR-CON Take 2 tablets (20 mEq total) by mouth daily. What changed: how much to take   ProAir HFA 108 (90 Base) MCG/ACT inhaler Generic drug: albuterol INHALE 2 PUFFS INTO THE  LUNGS  EVERY 6 HOURS AS  NEEDED What changed: reasons to take this   rosuvastatin 20 MG tablet Commonly known as: CRESTOR Take 20 mg by mouth every evening.   sulfamethoxazole-trimethoprim 800-160 MG tablet Commonly known as: BACTRIM DS Take 1 tablet by mouth every 12 (twelve) hours for 5 days.   tamsulosin 0.4 MG Caps capsule Commonly known as: FLOMAX Take 1 capsule (0.4 mg total) by mouth daily after supper.               Durable Medical Equipment  (From admission, onward)           Start     Ordered   07/07/22 1434  For home use only DME standard manual wheelchair with seat cushion  Once       Comments: Patient suffers from recent decline in their functional status which impairs their ability to perform daily activities like ambulating  in the home.  A cane  will not resolve issue with performing activities of daily living. A wheelchair will allow patient to safely perform  daily activities. Patient can safely propel the wheelchair in the home or has a caregiver who can provide assistance. Length of need lifetime . Accessories: elevating leg rests (ELRs), wheel locks, extensions and anti-tippers.  Seat and back cushions   07/07/22 1433            Follow-up [facility] Follow up.   Specialty: Home Health Services Contact information: [place] [address] [phone]         Cherre Robins, MD Follow up in 3 month(s).   Specialties: Vascular Surgery, Interventional Cardiology Contact information: [place] [address] [phone]                Discharge Exam: Danley Danker Weights   07/05/22 0120  Weight: 103.1 kg   General; NAD  Condition at discharge: stable  The results of significant diagnostics from this hospitalization (including imaging, microbiology, ancillary and laboratory) are listed below for reference.   Imaging Studies: ECHOCARDIOGRAM COMPLETE  Result Date: 07/06/2022    ECHOCARDIOGRAM REPORT   Patient Name:   HANDY MCLOUD Date of Exam: 07/06/2022 Medical Rec #:  [id number]           Height:       73.0 in Accession #:    [id number]          Weight:       227.3 lb Date of Birth:  Jan 27, 1946           BSA:          2.272 m Patient Age:    76 years            BP:           115/66 mmHg Patient Gender: M                   HR:           60 bpm. Exam Location:  Inpatient Procedure: 2D Echo, Cardiac Doppler and Color Doppler Indications:    abnormal ecg  History:        Patient has prior history of Echocardiogram examinations, most                 recent 01/25/2022. COPD and chronic kidney disease,                 Arrythmias:Atrial Fibrillation;  Risk Factors:Diabetes,                 Hypertension and Dyslipidemia.  Sonographer:    Johny Chess RDCS Referring Phys: [phone] Sayaka Hoeppner A Garald Rhew IMPRESSIONS  1. Left ventricular ejection fraction, by  estimation, is 65 to 70%. The left ventricle has normal function. The left ventricle has no regional wall motion abnormalities. There is severe asymmetric left ventricular hypertrophy of the septal segment. Left  ventricular diastolic parameters are indeterminate.  2. Right ventricular systolic function is normal. The right ventricular size is normal. Mildly increased right ventricular wall thickness.  3. Left atrial size was severely dilated.  4. Right atrial size was mildly dilated.  5. Large pleural effusion in the left lateral region.  6. The mitral valve is abnormal. No evidence of mitral valve regurgitation. No evidence of mitral stenosis.  7. The aortic valve is tricuspid. Aortic valve regurgitation is not visualized. No aortic stenosis is present.  8. The inferior vena cava is dilated in size with >50% respiratory variability, suggesting right atrial pressure of 8 mmHg. Comparison(s): No significant change from prior study. FINDINGS  Left Ventricle: Left ventricular ejection fraction, by estimation, is 65 to 70%. The left ventricle has normal function. The left ventricle has no regional wall motion abnormalities. The left ventricular internal cavity size was small. There is severe asymmetric left ventricular hypertrophy of the septal segment. Left ventricular diastolic parameters are indeterminate. Right Ventricle: The right ventricular size is normal. Mildly increased right ventricular wall thickness. Right ventricular systolic function is normal. Left Atrium: Left atrial size was severely dilated. Right Atrium: Right atrial size was mildly dilated. Pericardium: Trivial pericardial effusion is present. The pericardial effusion is lateral to the left ventricle. Mitral Valve: The mitral valve is abnormal. No evidence of mitral valve regurgitation. No evidence of mitral valve stenosis. Tricuspid Valve: The tricuspid valve is normal in structure. Tricuspid valve regurgitation is not demonstrated. No evidence of  tricuspid stenosis. Aortic Valve: The aortic valve is tricuspid. Aortic valve regurgitation is not visualized. No aortic stenosis is present. Pulmonic Valve: The pulmonic valve was normal in structure. Pulmonic valve regurgitation is trivial. No evidence of pulmonic stenosis. Aorta: The aortic root and ascending aorta are structurally normal, with no evidence of dilitation. Venous: The inferior vena cava is dilated in size with greater than 50% respiratory variability, suggesting right atrial pressure of 8 mmHg. IAS/Shunts: No atrial level shunt detected by color flow Doppler. Additional Comments: There is a large pleural effusion in the left lateral region.  LEFT VENTRICLE PLAX 2D LVIDd:         3.50 cm LVIDs:         2.30 cm LV PW:         1.40 cm LV IVS:        1.20 cm LVOT diam:     2.40 cm LVOT Area:     4.52 cm  RIGHT VENTRICLE          IVC RV Basal diam:  2.80 cm  IVC diam: 2.50 cm LEFT ATRIUM              Index        RIGHT ATRIUM           Index LA diam:        4.20 cm  1.85 cm/m   RA Area:     25.80 cm LA Vol (A2C):   106.0 ml 46.66 ml/m  RA Volume:   80.70 ml  35.52 ml/m LA Vol (A4C):   128.0 ml 56.34 ml/m LA Biplane Vol: 123.0 ml 54.14 ml/m                        PULMONIC VALVE AORTA                 PR End Diast Vel: 7.40 msec Ao Root diam: 3.80 cm Ao Asc diam:  3.30 cm  SHUNTS Systemic Diam: 2.40 cm Rudean Haskell MD Electronically signed by Rudean Haskell MD Signature Date/Time: 07/06/2022/5:42:32 PM    Final    VAS [place] ABI WITH/WO TBI  Result Date: 07/06/2022  LOWER EXTREMITY DOPPLER STUDY Patient Name:  AXTYN WOEHLER  Date of Exam:   07/05/2022 Medical Rec #: [id number]            Accession #:    [id number] Date of Birth: May 23, 1946            Patient Gender: M Patient Age:   59 years Exam Location:  [facility] Procedure:      VAS [place] ABI WITH/WO TBI Referring Phys: Jerald Kief Marshun Duva --------------------------------------------------------------------------------   Indications: Peripheral artery disease. High Risk Factors: Hypertension, hyperlipidemia, Diabetes, prior CVA. Other Factors: CKD, CHF, Afib,.  Limitations: Today's exam was limited due to edema and thickened skin. Comparison Study: No previous exams Performing Technologist: Rogelia Rohrer RVT/RDMS  Examination Guidelines: A complete evaluation includes at minimum, Doppler waveform signals and systolic blood pressure reading at the level of bilateral brachial, anterior tibial, and posterior tibial arteries, when vessel segments are accessible. Bilateral testing is considered an integral part of a complete examination. Photoelectric Plethysmograph (PPG) waveforms and toe systolic pressure readings are included as required and additional duplex testing as needed. Limited examinations for reoccurring indications may be performed as noted.  ABI Findings: +---------+------------------+-----+----------+--------+ Right    Rt Pressure (mmHg)IndexWaveform  Comment  +---------+------------------+-----+----------+--------+ PTA      87                0.70 monophasic         +---------+------------------+-----+----------+--------+ DP       83                0.67 monophasic         +---------+------------------+-----+----------+--------+ Great Toe                       Absent             +---------+------------------+-----+----------+--------+ +---------+------------------+-----+----------+-------+ Left     Lt Pressure (mmHg)IndexWaveform  Comment +---------+------------------+-----+----------+-------+ Brachial 124                                      +---------+------------------+-----+----------+-------+ PTA      99                0.80 monophasic        +---------+------------------+-----+----------+-------+ DP       120               0.97 monophasic        +---------+------------------+-----+----------+-------+ Great Toe                       Absent             +---------+------------------+-----+----------+-------+  Summary: Right: Resting right ankle-brachial index indicates moderate right lower extremity arterial disease. The  right toe-brachial index is absent. Left: The left toe-brachial index is absent. Although ankle brachial indices are within normal limits (0.95-1.29), arterial Doppler waveforms at the ankle suggest some component of arterial occlusive disease. *See table(s) above for measurements and observations.  Electronically signed by Jamelle Haring on 07/06/2022 at 11:16:34 AM.    Final    VAS [place] LOWER EXTREMITY VENOUS (DVT)  Result Date: 07/06/2022  Lower Venous DVT Study Patient Name:  XAYNE BRUMBAUGH  Date of Exam:   07/05/2022 Medical Rec #: [id number]            Accession #:    [id number] Date of Birth: 04/19/1946            Patient Gender: M Patient Age:   63 years Exam [facility]  [facility] Procedure:      VAS [place] LOWER EXTREMITY VENOUS (DVT) Referring Phys: Wynetta Fines --------------------------------------------------------------------------------  Indications: Edema.  Risk Factors: Chronic venous insufficiency & edema. Limitations: Body habitus and poor ultrasound/tissue interface. Comparison Study: Previous exam on 10/24/20 was negative for DVT Performing Technologist: Rogelia Rohrer RVT, RDMS  Examination Guidelines: A complete evaluation includes B-mode imaging, spectral Doppler, color Doppler, and power Doppler as needed of all accessible portions of each vessel. Bilateral testing is considered an integral part of a complete examination. Limited examinations for reoccurring indications may be performed as noted. The reflux portion of the exam is performed with the patient in reverse Trendelenburg.  +---------+---------------+---------+-----------+----------+-------------------+ RIGHT    CompressibilityPhasicitySpontaneityPropertiesThrombus Aging      +---------+---------------+---------+-----------+----------+-------------------+  CFV      Full           Yes      Yes                                      +---------+---------------+---------+-----------+----------+-------------------+ SFJ      Full                                                             +---------+---------------+---------+-----------+----------+-------------------+ FV Prox  Full           Yes      Yes                                      +---------+---------------+---------+-----------+----------+-------------------+ FV Mid   Full           Yes      Yes                                      +---------+---------------+---------+-----------+----------+-------------------+ FV DistalFull           Yes      Yes                                      +---------+---------------+---------+-----------+----------+-------------------+ PFV      Full                                                             +---------+---------------+---------+-----------+----------+-------------------+  POP      Full           Yes      Yes                                      +---------+---------------+---------+-----------+----------+-------------------+ PTV      Full                                         Not well visualized +---------+---------------+---------+-----------+----------+-------------------+ PERO     Full                                         Not well visualized +---------+---------------+---------+-----------+----------+-------------------+   +---------+---------------+---------+-----------+----------+-------------------+ LEFT     CompressibilityPhasicitySpontaneityPropertiesThrombus Aging      +---------+---------------+---------+-----------+----------+-------------------+ CFV      Full           Yes      Yes                                      +---------+---------------+---------+-----------+----------+-------------------+ SFJ      Full                                                              +---------+---------------+---------+-----------+----------+-------------------+ FV Prox  Full           Yes      Yes                                      +---------+---------------+---------+-----------+----------+-------------------+ FV Mid   Full           Yes      Yes                                      +---------+---------------+---------+-----------+----------+-------------------+ FV DistalFull           Yes      Yes                                      +---------+---------------+---------+-----------+----------+-------------------+ PFV      Full                                                             +---------+---------------+---------+-----------+----------+-------------------+ POP      Full                                                             +---------+---------------+---------+-----------+----------+-------------------+  PTV      Full                                         Not well visualized +---------+---------------+---------+-----------+----------+-------------------+ PERO     Full                                         Not well visualized +---------+---------------+---------+-----------+----------+-------------------+     Summary: BILATERAL: - No evidence of deep vein thrombosis seen in the lower extremities, bilaterally. -No evidence of popliteal cyst, bilaterally.   *See table(s) above for measurements and observations. Electronically signed by Jamelle Haring on 07/06/2022 at 11:16:25 AM.    Final    CT HEAD WO CONTRAST (5MM)  Result Date: 07/05/2022 CLINICAL DATA:  Altered mental status EXAM: CT HEAD WITHOUT CONTRAST TECHNIQUE: Contiguous axial images were obtained from the base of the skull through the vertex without intravenous contrast. RADIATION DOSE REDUCTION: This exam was performed according to the departmental dose-optimization program which includes automated exposure control, adjustment of the mA and/or kV according to  patient size and/or use of iterative reconstruction technique. COMPARISON:  05/07/2022 FINDINGS: Brain: There is no mass, hemorrhage or extra-axial collection. There is generalized atrophy without lobar predilection. Hypodensity of the white matter is most commonly associated with chronic microvascular disease. Previously seen right subdural hematoma has resolved. Vascular: No abnormal hyperdensity of the major intracranial arteries or dural venous sinuses. No intracranial atherosclerosis. Skull: The visualized skull base, calvarium and extracranial soft tissues are normal. Sinuses/Orbits: No fluid levels or advanced mucosal thickening of the visualized paranasal sinuses. No mastoid or middle ear effusion. The orbits are normal. IMPRESSION: 1. No acute intracranial abnormality. 2. Generalized atrophy and findings of chronic microvascular disease. 3. Previously seen right subdural hematoma has resolved. Electronically Signed   By: Ulyses Jarred M.D.   On: 07/05/2022 03:56   CT Chest W Contrast  Result Date: 07/04/2022 CLINICAL DATA:  Abnormal chest radiograph EXAM: CT CHEST WITH CONTRAST TECHNIQUE: Multidetector CT imaging of the chest was performed during intravenous contrast administration. RADIATION DOSE REDUCTION: This exam was performed according to the departmental dose-optimization program which includes automated exposure control, adjustment of the mA and/or kV according to patient size and/or use of iterative reconstruction technique. CONTRAST:  81m OMNIPAQUE IOHEXOL 350 MG/ML SOLN COMPARISON:  Chest radiographs dated 07/04/2022 and 04/30/2022 FINDINGS: Cardiovascular: Study is not tailored for evaluation of the pulmonary arteries. However, there are filling defects in the right main pulmonary artery in the lobar left upper and lower lobe pulmonary arteries (series 3/images 62 and 71), reflecting large volume pulmonary embolism. The heart is top-normal in size. No pericardial effusion. No findings to  suggest right heart strain. No evidence of thoracic aortic aneurysm or dissection. Atherosclerotic calcifications of the aortic arch. Mediastinum/Nodes: No suspicious mediastinal lymphadenopathy. Visualized thyroid is unremarkable. Lungs/Pleura: Small right pleural effusion with two foci of loculated fluid along the right major fissure (series 4/image 56), corresponding to the nodular opacities on chest radiograph. Mild linear scarring/atelectasis in the right upper lobe and left lower lobe. No focal consolidation. No suspicious pulmonary nodules. Mild centrilobular and paraseptal emphysematous changes, upper lung predominant. No pneumothorax. Upper Abdomen: Visualized upper abdomen is notable for scattered renal cysts measuring up to 8.4 cm in the left upper pole (series 3/image  117), benign. Vascular calcifications. Musculoskeletal: Degenerative changes of the visualized thoracolumbar spine. IMPRESSION: Large volume pulmonary embolism in the right main pulmonary artery and lobar left upper and lower lobe pulmonary arteries. No findings to suggest right heart strain. Critical Value/emergent results were called by telephone at the time of interpretation on 07/04/2022 at 11:47 pm to provider JULIE HAVILAND , who verbally acknowledged these results. Small right pleural effusion, partially loculated. Loculated fluid accounts for the radiographic appearance. No suspicious pulmonary nodules. Aortic Atherosclerosis (ICD10-I70.0) and Emphysema (ICD10-J43.9). Electronically Signed   By: Julian Hy M.D.   On: 07/04/2022 23:49   DG Chest Portable 1 View  Result Date: 07/04/2022 CLINICAL DATA:  Altered mental status EXAM: PORTABLE CHEST 1 VIEW COMPARISON:  Chest x-ray 04/30/2022.  Chest CT 01/10/2011 FINDINGS: There are 2 oval densities in the right mid lung with the largest measuring 3.8 x 1.9 cm, indeterminate. The lungs are otherwise clear. Costophrenic angles are clear. There is no pneumothorax.  Cardiomediastinal silhouette is within normal limits. There is mild elevation of the left hemidiaphragm. No acute fractures are seen. IMPRESSION: There are 2 oval densities in the right mid lung with the largest measuring 3.8 x 1.9 cm, indeterminate. CT chest is recommended for further evaluation. Electronically Signed   By: Ronney Asters M.D.   On: 07/04/2022 18:19    Microbiology: Results for orders placed or performed during the hospital encounter of 07/04/22  Culture, blood (routine x 2)     Status: None (Preliminary result)   Collection Time: 07/04/22  5:58 PM   Specimen: BLOOD  Result Value Ref Range Status   Specimen Description BLOOD RIGHT ANTECUBITAL  Final   Special Requests   Final    BOTTLES DRAWN AEROBIC AND ANAEROBIC Blood Culture adequate volume   Culture   Final    NO GROWTH 4 DAYS Performed at [facility], [place] [address] [place], [place] [address]    Report Status PENDING  Incomplete  Urine Culture     Status: Abnormal   Collection Time: 07/04/22  9:38 PM   Specimen: Urine, Clean Catch  Result Value Ref Range Status   Specimen Description URINE, CLEAN CATCH  Final   Special Requests   Final    NONE Performed at [facility], [place] [address] [place], [place] [address]    Culture 50,000 COLONIES/mL PROTEUS PENNERI (A)  Final   Report Status 07/08/2022 FINAL  Final   Organism ID, Bacteria PROTEUS PENNERI (A)  Final      Susceptibility   Proteus penneri - MIC*    AMPICILLIN >=32 RESISTANT Resistant     CEFAZOLIN >=64 RESISTANT Resistant     CEFEPIME 2 SENSITIVE Sensitive     CEFTRIAXONE >=64 RESISTANT Resistant     CIPROFLOXACIN <=0.25 SENSITIVE Sensitive     GENTAMICIN <=1 SENSITIVE Sensitive     IMIPENEM 2 SENSITIVE Sensitive     NITROFURANTOIN RESISTANT Resistant     TRIMETH/SULFA <=20 SENSITIVE Sensitive     AMPICILLIN/SULBACTAM >=32 RESISTANT Resistant     PIP/TAZO <=4 SENSITIVE Sensitive     * 50,000 COLONIES/mL PROTEUS PENNERI  Culture,  blood (routine x 2)     Status: None (Preliminary result)   Collection Time: 07/05/22  2:16 AM   Specimen: BLOOD  Result Value Ref Range Status   Specimen Description BLOOD BLOOD LEFT HAND  Final   Special Requests   Final    BOTTLES DRAWN AEROBIC AND ANAEROBIC Blood Culture adequate volume  Culture   Final    NO GROWTH 3 DAYS Performed at [facility], [place] [address] [place], [place] [address]    Report Status PENDING  Incomplete    Labs: CBC: Recent Labs  Lab 07/04/22 1149 07/06/22 0150 07/07/22 1339  WBC 6.3 6.1 4.0  NEUTROABS 4.4  --   --   HGB 14.3 12.0* 13.7  HCT 45.7 36.4* 45.1  MCV 84.3 82.0 86.2  PLT 258 190 824   Basic Metabolic Panel: Recent Labs  Lab 07/04/22 1149 07/05/22 0216 07/05/22 2126 07/06/22 0150 07/06/22 1543 07/07/22 1339 07/08/22 0735  NA 140   < > 139 137 137 138 139  K 3.3*   < > 3.5 2.9* 3.8 4.3 3.6  CL 104   < > 104 103 104 103 107  CO2 26   < > _0 GLUCOSE 114*   < > 111* 102* 119* 112* 100*  BUN 11   < > _1 CREATININE 1.41*   < > 1.58* 1.46* 1.59* 1.44* 1.30*  CALCIUM 8.5*   < > 8.0* 7.9* 8.2* 8.3* 8.4*  MG 1.4*  --  1.4* 1.9  --   --  1.7  PHOS  --   --   --  2.8  --   --   --    < > = values in this interval not displayed.   Liver Function Tests: Recent Labs  Lab 07/04/22 1149  AST 26  ALT 13  ALKPHOS 132*  BILITOT 0.6  PROT 7.6  ALBUMIN 2.4*   CBG: No results for input(s): "GLUCAP" in the last 168 hours.  Discharge time spent: greater than 30 minutes.  Signed: Elmarie Shiley, MD [facility] 07/08/2022

## 2022-07-08 NOTE — Consult Note (Addendum)
   [facility] CM Inpatient Consult   07/08/2022  Joshual Tasso [birth year] [id number]  [facility] [ACO] Patient: [employer]  Primary Care Provider:  Biagio Borg, MD is listed to provide the transition of care follow up   Patient screened for hospitalization with noted extreme high risk score for unplanned readmission risk to assess for potential [place] Management service needs for post hospital transition for care coordination.  Review of patient's electronic medical record reveals patient is for home with home health today. Patient with Stage 3 B Chronic Kidney disease  Plan:   Referral request for community care coordination: made for readmission prevention due to extreme high risk score  4:01 pm Spoke with daughter via phone number listed in electronic medical record, HIPAA verified, explained reason for post hospital follow up with nursing and she agrees.   Of note, [facility] Care Management/Population Health does not replace or interfere with any arrangements made by the Inpatient Transition of Care team.  For questions contact:   Natividad Brood, RN BSN [place]  [phone] business mobile phone Toll free office [phone]  *[place]  [phone] Fax number: [phone] [email or web address]

## 2022-07-09 ENCOUNTER — Encounter: Payer: Self-pay | Admitting: *Deleted

## 2022-07-09 ENCOUNTER — Telehealth: Payer: Self-pay | Admitting: *Deleted

## 2022-07-09 LAB — CULTURE, BLOOD (ROUTINE X 2)
Culture: NO GROWTH
Special Requests: ADEQUATE

## 2022-07-09 NOTE — Patient Outreach (Signed)
  Care Coordination [facility] Note Transition Care Management Unsuccessful Follow-up Telephone Call  Date of discharge and from where:  Tuesday, 07/08/22[facility]; UTI, generalized weakness; bilateral pulmonary embolism  Attempts:  1st Attempt  Reason for unsuccessful TCM follow-up call:  Left voice message  Oneta Rack, RN, BSN, CCRN Alumnus RN CM Care Coordination/ Transition of [place] Management [phone]: direct office

## 2022-07-09 NOTE — Progress Notes (Unsigned)
  Care Coordination  Outreach Note  07/09/2022 Name: [name] MRN: [id number] DOB: [birth year]   Care Coordination Outreach Attempts: An unsuccessful telephone outreach was attempted today to offer the patient information about available care coordination services as a benefit of their health plan.   Referral received   Follow Up Plan:  Additional outreach attempts will be made to offer the patient care coordination information and services.   Encounter Outcome:  No Answer  Julian Hy, [place] Direct Dial: [phone]

## 2022-07-10 ENCOUNTER — Encounter: Payer: Self-pay | Admitting: *Deleted

## 2022-07-10 ENCOUNTER — Telehealth: Payer: Self-pay | Admitting: *Deleted

## 2022-07-10 DIAGNOSIS — G8929 Other chronic pain: Secondary | ICD-10-CM | POA: Diagnosis not present

## 2022-07-10 DIAGNOSIS — S065XAA Traumatic subdural hemorrhage with loss of consciousness status unknown, initial encounter: Secondary | ICD-10-CM

## 2022-07-10 DIAGNOSIS — I4729 Other ventricular tachycardia: Secondary | ICD-10-CM | POA: Diagnosis not present

## 2022-07-10 DIAGNOSIS — I130 Hypertensive heart and chronic kidney disease with heart failure and stage 1 through stage 4 chronic kidney disease, or unspecified chronic kidney disease: Secondary | ICD-10-CM | POA: Diagnosis not present

## 2022-07-10 DIAGNOSIS — I4821 Permanent atrial fibrillation: Secondary | ICD-10-CM | POA: Diagnosis not present

## 2022-07-10 DIAGNOSIS — Z8616 Personal history of COVID-19: Secondary | ICD-10-CM | POA: Diagnosis not present

## 2022-07-10 DIAGNOSIS — M160 Bilateral primary osteoarthritis of hip: Secondary | ICD-10-CM | POA: Diagnosis not present

## 2022-07-10 DIAGNOSIS — I872 Venous insufficiency (chronic) (peripheral): Secondary | ICD-10-CM | POA: Diagnosis not present

## 2022-07-10 DIAGNOSIS — I5032 Chronic diastolic (congestive) heart failure: Secondary | ICD-10-CM | POA: Diagnosis not present

## 2022-07-10 DIAGNOSIS — N390 Urinary tract infection, site not specified: Secondary | ICD-10-CM | POA: Diagnosis not present

## 2022-07-10 DIAGNOSIS — E1122 Type 2 diabetes mellitus with diabetic chronic kidney disease: Secondary | ICD-10-CM | POA: Diagnosis not present

## 2022-07-10 DIAGNOSIS — E1151 Type 2 diabetes mellitus with diabetic peripheral angiopathy without gangrene: Secondary | ICD-10-CM | POA: Diagnosis not present

## 2022-07-10 DIAGNOSIS — M47812 Spondylosis without myelopathy or radiculopathy, cervical region: Secondary | ICD-10-CM | POA: Diagnosis not present

## 2022-07-10 DIAGNOSIS — K5730 Diverticulosis of large intestine without perforation or abscess without bleeding: Secondary | ICD-10-CM | POA: Diagnosis not present

## 2022-07-10 DIAGNOSIS — I495 Sick sinus syndrome: Secondary | ICD-10-CM | POA: Diagnosis not present

## 2022-07-10 DIAGNOSIS — C9000 Multiple myeloma not having achieved remission: Secondary | ICD-10-CM | POA: Diagnosis not present

## 2022-07-10 DIAGNOSIS — I69322 Dysarthria following cerebral infarction: Secondary | ICD-10-CM | POA: Diagnosis not present

## 2022-07-10 DIAGNOSIS — E1142 Type 2 diabetes mellitus with diabetic polyneuropathy: Secondary | ICD-10-CM | POA: Diagnosis not present

## 2022-07-10 DIAGNOSIS — E785 Hyperlipidemia, unspecified: Secondary | ICD-10-CM | POA: Diagnosis not present

## 2022-07-10 DIAGNOSIS — I69351 Hemiplegia and hemiparesis following cerebral infarction affecting right dominant side: Secondary | ICD-10-CM | POA: Diagnosis not present

## 2022-07-10 DIAGNOSIS — N1832 Chronic kidney disease, stage 3b: Secondary | ICD-10-CM | POA: Diagnosis not present

## 2022-07-10 DIAGNOSIS — S065X0D Traumatic subdural hemorrhage without loss of consciousness, subsequent encounter: Secondary | ICD-10-CM | POA: Diagnosis not present

## 2022-07-10 DIAGNOSIS — J449 Chronic obstructive pulmonary disease, unspecified: Secondary | ICD-10-CM | POA: Diagnosis not present

## 2022-07-10 LAB — CULTURE, BLOOD (ROUTINE X 2)
Culture: NO GROWTH
Special Requests: ADEQUATE

## 2022-07-10 NOTE — Progress Notes (Signed)
  Care Coordination   Note   07/10/2022 Name: [name] MRN: [id number] DOB: 12/25/1945  [name] is a 76 y.o. year old male who sees Biagio Borg, MD for primary care. I reached out to [employer] by phone today to offer care coordination services.  Mr. Pound was given information about Care Coordination services today including:   The Care Coordination services include support from the care team which includes your Nurse Coordinator, Clinical Social Worker, or Pharmacist.  The Care Coordination team is here to help remove barriers to the health concerns and goals most important to you. Care Coordination services are voluntary, and the patient may decline or stop services at any time by request to their care team member.   Care Coordination Consent Status: Patient did not agree to participate in care coordination services at this time.  Follow up plan:  pt daughter declined services at this time   Encounter Outcome:  Pt. Refused  Julian Hy, [employer] Direct Dial: [phone]

## 2022-07-10 NOTE — Patient Outreach (Signed)
Care Coordination [facility] Note Transition Care Management Follow-up Telephone Call Date of discharge and from where: [place], 07/08/22 [facility]; generalized weakness, UTI, confusion, agitation How have you been since you were released from the hospital? Per daughter/ caregiver [name], whom resides with patient; patient provided verbal consent and request to speak with daughter today and entirety of call completed with patient/ daughter while phone on speaker mode: "He is about where he was before he went to the hospital, he is very limited in what he can do, and I pretty much do everything for him, in addition to working full time at night as a Marine scientist at [employer]; my adult son looks after him when I am at work.  He is using his walker and seems fairly stable on his feet; I am a nurse I know about the precautions of him falling when he is on a blood thinner, we take every effort to make sure he does not fall.  Home Health PT is active, and they just now showed up.  I don't want any nurse care manager calling me on a regular basis, but you can have the care guide call me to give me resources for in-home care assistance options and resources for housing-- I have just started looking for a new home that can better accommodate his needs" Any questions or concerns? No  Items Reviewed: Did the pt receive and understand the discharge instructions provided? Yes  Medications obtained and verified? No - daughter adamantly declines medication review; states she is a Marine scientist and has reviewed all changes post-hospital discharge and is giving his medications as instructed at time of discharge; confirms she manages all aspects of medication administration Other? No  Any new allergies since your discharge? No  Dietary orders reviewed? No- daughter declined Do you have support at home? Yes  patient resides with daughter [name] who provides assistance with all care needs as indicated  Home Care and  Equipment/Supplies: Were home health services ordered? yes If so, what is the name of the agency? [facility]  Has the agency set up a time to come to the patient's home? yes Were any new equipment or medical supplies ordered?  Yes: wheelchair What is the name of the medical supply agency? "Not sure, I'll look at my phone call log and call them" Were you able to get the supplies/equipment? Reports obtained a wheelchair, however states it is "not the one he needs;" states she has plans to  contact company to return/ obtain different wheelchair; declines need for assistance with this Do you have any questions related to the use of the equipment or supplies? No  Functional Questionnaire: (I = Independent and D = Dependent) ADLs:  D  daughter [name] who provides assistance with all care needs as indicated  Bathing/Dressing- D  daughter [name] who provides assistance with all care needs as indicated  Meal Prep- D  daughter [name] who provides assistance with all care needs as indicated  Eating- I  Maintaining continence- I  daughter [name] who provides assistance with all care needs as indicated  Transferring/Ambulation- I  daughter [name] who provides assistance with all care needs as indicated  Managing Meds- D  daughter [name] manages all aspects of medication administration  Follow up appointments reviewed:  PCP Hospital f/u appt confirmed? No  Scheduled to see - on - @ [facility] f/u appt confirmed? Yes  Scheduled to see cardiology provider on Friday 07/18/22 @ 1:55 pm Are transportation arrangements needed? No  If their condition worsens, is the pt aware to call PCP or go to the Emergency Dept.? Yes Was the patient provided with contact information for the PCP's office or ED? No daughter declines-- reports already has contact information for all care providers Was to pt encouraged to call back with questions or concerns? Yes  SDOH assessments and interventions completed:    Yes SDOH Interventions Today    Flowsheet Row Most Recent Value  SDOH Interventions   Food Insecurity Interventions Intervention Not Indicated  Housing Interventions Other (Comment)  [daughter/ caregiver reports she is currently in process of obtaining new housing and is interested in housing resources that can accomodate handicapped needs of patient- [facility] [place] care Guide referral placed]  Transportation Interventions Intervention Not Indicated  [daughter/ caregiver provides all transportation]      Care Coordination Interventions:  Referred for Care Coordination Services:  Engineer, agricultural- for in-home care assistance and housing resources/ options Provided education around need for updated DPR to include caregiver; provided education around precautions to take/ fall prevention in setting of ACT    Encounter Outcome:  Pt. Visit Completed    Oneta Rack, RN, BSN, CCRN Alumnus RN CM Care Coordination/ Transition of [place] Management [phone]: direct office

## 2022-07-16 DIAGNOSIS — M47812 Spondylosis without myelopathy or radiculopathy, cervical region: Secondary | ICD-10-CM | POA: Diagnosis not present

## 2022-07-16 DIAGNOSIS — E1142 Type 2 diabetes mellitus with diabetic polyneuropathy: Secondary | ICD-10-CM | POA: Diagnosis not present

## 2022-07-16 DIAGNOSIS — J449 Chronic obstructive pulmonary disease, unspecified: Secondary | ICD-10-CM | POA: Diagnosis not present

## 2022-07-16 DIAGNOSIS — E785 Hyperlipidemia, unspecified: Secondary | ICD-10-CM | POA: Diagnosis not present

## 2022-07-16 DIAGNOSIS — I4821 Permanent atrial fibrillation: Secondary | ICD-10-CM | POA: Diagnosis not present

## 2022-07-16 DIAGNOSIS — N1832 Chronic kidney disease, stage 3b: Secondary | ICD-10-CM | POA: Diagnosis not present

## 2022-07-16 DIAGNOSIS — G8929 Other chronic pain: Secondary | ICD-10-CM | POA: Diagnosis not present

## 2022-07-16 DIAGNOSIS — E1151 Type 2 diabetes mellitus with diabetic peripheral angiopathy without gangrene: Secondary | ICD-10-CM | POA: Diagnosis not present

## 2022-07-16 DIAGNOSIS — Z8616 Personal history of COVID-19: Secondary | ICD-10-CM | POA: Diagnosis not present

## 2022-07-16 DIAGNOSIS — I5032 Chronic diastolic (congestive) heart failure: Secondary | ICD-10-CM | POA: Diagnosis not present

## 2022-07-16 DIAGNOSIS — I872 Venous insufficiency (chronic) (peripheral): Secondary | ICD-10-CM | POA: Diagnosis not present

## 2022-07-16 DIAGNOSIS — K5730 Diverticulosis of large intestine without perforation or abscess without bleeding: Secondary | ICD-10-CM | POA: Diagnosis not present

## 2022-07-16 DIAGNOSIS — S065X0D Traumatic subdural hemorrhage without loss of consciousness, subsequent encounter: Secondary | ICD-10-CM | POA: Diagnosis not present

## 2022-07-16 DIAGNOSIS — E1122 Type 2 diabetes mellitus with diabetic chronic kidney disease: Secondary | ICD-10-CM | POA: Diagnosis not present

## 2022-07-16 DIAGNOSIS — I130 Hypertensive heart and chronic kidney disease with heart failure and stage 1 through stage 4 chronic kidney disease, or unspecified chronic kidney disease: Secondary | ICD-10-CM | POA: Diagnosis not present

## 2022-07-16 DIAGNOSIS — I69322 Dysarthria following cerebral infarction: Secondary | ICD-10-CM | POA: Diagnosis not present

## 2022-07-16 DIAGNOSIS — I4729 Other ventricular tachycardia: Secondary | ICD-10-CM | POA: Diagnosis not present

## 2022-07-16 DIAGNOSIS — N390 Urinary tract infection, site not specified: Secondary | ICD-10-CM | POA: Diagnosis not present

## 2022-07-16 DIAGNOSIS — C9000 Multiple myeloma not having achieved remission: Secondary | ICD-10-CM | POA: Diagnosis not present

## 2022-07-16 DIAGNOSIS — I495 Sick sinus syndrome: Secondary | ICD-10-CM | POA: Diagnosis not present

## 2022-07-16 DIAGNOSIS — M160 Bilateral primary osteoarthritis of hip: Secondary | ICD-10-CM | POA: Diagnosis not present

## 2022-07-16 DIAGNOSIS — I69351 Hemiplegia and hemiparesis following cerebral infarction affecting right dominant side: Secondary | ICD-10-CM | POA: Diagnosis not present

## 2022-07-17 ENCOUNTER — Telehealth: Payer: Self-pay | Admitting: *Deleted

## 2022-07-17 DIAGNOSIS — M160 Bilateral primary osteoarthritis of hip: Secondary | ICD-10-CM | POA: Diagnosis not present

## 2022-07-17 DIAGNOSIS — E1142 Type 2 diabetes mellitus with diabetic polyneuropathy: Secondary | ICD-10-CM | POA: Diagnosis not present

## 2022-07-17 DIAGNOSIS — S065X0D Traumatic subdural hemorrhage without loss of consciousness, subsequent encounter: Secondary | ICD-10-CM | POA: Diagnosis not present

## 2022-07-17 DIAGNOSIS — M47812 Spondylosis without myelopathy or radiculopathy, cervical region: Secondary | ICD-10-CM | POA: Diagnosis not present

## 2022-07-17 DIAGNOSIS — I872 Venous insufficiency (chronic) (peripheral): Secondary | ICD-10-CM | POA: Diagnosis not present

## 2022-07-17 DIAGNOSIS — I5032 Chronic diastolic (congestive) heart failure: Secondary | ICD-10-CM | POA: Diagnosis not present

## 2022-07-17 DIAGNOSIS — I495 Sick sinus syndrome: Secondary | ICD-10-CM | POA: Diagnosis not present

## 2022-07-17 DIAGNOSIS — E1151 Type 2 diabetes mellitus with diabetic peripheral angiopathy without gangrene: Secondary | ICD-10-CM | POA: Diagnosis not present

## 2022-07-17 DIAGNOSIS — I69351 Hemiplegia and hemiparesis following cerebral infarction affecting right dominant side: Secondary | ICD-10-CM | POA: Diagnosis not present

## 2022-07-17 DIAGNOSIS — I4821 Permanent atrial fibrillation: Secondary | ICD-10-CM | POA: Diagnosis not present

## 2022-07-17 DIAGNOSIS — E785 Hyperlipidemia, unspecified: Secondary | ICD-10-CM | POA: Diagnosis not present

## 2022-07-17 DIAGNOSIS — I4729 Other ventricular tachycardia: Secondary | ICD-10-CM | POA: Diagnosis not present

## 2022-07-17 DIAGNOSIS — I69322 Dysarthria following cerebral infarction: Secondary | ICD-10-CM | POA: Diagnosis not present

## 2022-07-17 DIAGNOSIS — I130 Hypertensive heart and chronic kidney disease with heart failure and stage 1 through stage 4 chronic kidney disease, or unspecified chronic kidney disease: Secondary | ICD-10-CM | POA: Diagnosis not present

## 2022-07-17 DIAGNOSIS — Z8616 Personal history of COVID-19: Secondary | ICD-10-CM | POA: Diagnosis not present

## 2022-07-17 DIAGNOSIS — K5730 Diverticulosis of large intestine without perforation or abscess without bleeding: Secondary | ICD-10-CM | POA: Diagnosis not present

## 2022-07-17 DIAGNOSIS — G8929 Other chronic pain: Secondary | ICD-10-CM | POA: Diagnosis not present

## 2022-07-17 DIAGNOSIS — N390 Urinary tract infection, site not specified: Secondary | ICD-10-CM | POA: Diagnosis not present

## 2022-07-17 DIAGNOSIS — E1122 Type 2 diabetes mellitus with diabetic chronic kidney disease: Secondary | ICD-10-CM | POA: Diagnosis not present

## 2022-07-17 DIAGNOSIS — J449 Chronic obstructive pulmonary disease, unspecified: Secondary | ICD-10-CM | POA: Diagnosis not present

## 2022-07-17 DIAGNOSIS — N1832 Chronic kidney disease, stage 3b: Secondary | ICD-10-CM | POA: Diagnosis not present

## 2022-07-17 DIAGNOSIS — C9000 Multiple myeloma not having achieved remission: Secondary | ICD-10-CM | POA: Diagnosis not present

## 2022-07-17 NOTE — Telephone Encounter (Signed)
   Telephone encounter was:  Unsuccessful.  07/17/2022 Name: Elston Aldape MRN: [id number] DOB: 10-May-1946  Unsuccessful outbound call made today to assist with:  Caregiver Stress  Outreach Attempt:  1st Attempt  A HIPAA compliant voice message was left requesting a return call.  Instructed patient to call back at [phone].  [place] [phone] [address] , [place] [address] Email : [email or web address]

## 2022-07-18 ENCOUNTER — Telehealth: Payer: Self-pay | Admitting: *Deleted

## 2022-07-18 ENCOUNTER — Ambulatory Visit: Payer: Medicare Other | Admitting: Physician Assistant

## 2022-07-18 DIAGNOSIS — I69351 Hemiplegia and hemiparesis following cerebral infarction affecting right dominant side: Secondary | ICD-10-CM | POA: Diagnosis not present

## 2022-07-18 DIAGNOSIS — M47812 Spondylosis without myelopathy or radiculopathy, cervical region: Secondary | ICD-10-CM | POA: Diagnosis not present

## 2022-07-18 DIAGNOSIS — E1151 Type 2 diabetes mellitus with diabetic peripheral angiopathy without gangrene: Secondary | ICD-10-CM | POA: Diagnosis not present

## 2022-07-18 DIAGNOSIS — E1122 Type 2 diabetes mellitus with diabetic chronic kidney disease: Secondary | ICD-10-CM | POA: Diagnosis not present

## 2022-07-18 DIAGNOSIS — N1832 Chronic kidney disease, stage 3b: Secondary | ICD-10-CM | POA: Diagnosis not present

## 2022-07-18 DIAGNOSIS — G8929 Other chronic pain: Secondary | ICD-10-CM | POA: Diagnosis not present

## 2022-07-18 DIAGNOSIS — I495 Sick sinus syndrome: Secondary | ICD-10-CM | POA: Diagnosis not present

## 2022-07-18 DIAGNOSIS — I4821 Permanent atrial fibrillation: Secondary | ICD-10-CM | POA: Diagnosis not present

## 2022-07-18 DIAGNOSIS — S065X0D Traumatic subdural hemorrhage without loss of consciousness, subsequent encounter: Secondary | ICD-10-CM | POA: Diagnosis not present

## 2022-07-18 DIAGNOSIS — I4729 Other ventricular tachycardia: Secondary | ICD-10-CM | POA: Diagnosis not present

## 2022-07-18 DIAGNOSIS — Z8616 Personal history of COVID-19: Secondary | ICD-10-CM | POA: Diagnosis not present

## 2022-07-18 DIAGNOSIS — I872 Venous insufficiency (chronic) (peripheral): Secondary | ICD-10-CM | POA: Diagnosis not present

## 2022-07-18 DIAGNOSIS — E1142 Type 2 diabetes mellitus with diabetic polyneuropathy: Secondary | ICD-10-CM | POA: Diagnosis not present

## 2022-07-18 DIAGNOSIS — C9000 Multiple myeloma not having achieved remission: Secondary | ICD-10-CM | POA: Diagnosis not present

## 2022-07-18 DIAGNOSIS — I69322 Dysarthria following cerebral infarction: Secondary | ICD-10-CM | POA: Diagnosis not present

## 2022-07-18 DIAGNOSIS — M160 Bilateral primary osteoarthritis of hip: Secondary | ICD-10-CM | POA: Diagnosis not present

## 2022-07-18 DIAGNOSIS — I130 Hypertensive heart and chronic kidney disease with heart failure and stage 1 through stage 4 chronic kidney disease, or unspecified chronic kidney disease: Secondary | ICD-10-CM | POA: Diagnosis not present

## 2022-07-18 DIAGNOSIS — K5730 Diverticulosis of large intestine without perforation or abscess without bleeding: Secondary | ICD-10-CM | POA: Diagnosis not present

## 2022-07-18 DIAGNOSIS — E785 Hyperlipidemia, unspecified: Secondary | ICD-10-CM | POA: Diagnosis not present

## 2022-07-18 DIAGNOSIS — J449 Chronic obstructive pulmonary disease, unspecified: Secondary | ICD-10-CM | POA: Diagnosis not present

## 2022-07-18 DIAGNOSIS — N390 Urinary tract infection, site not specified: Secondary | ICD-10-CM | POA: Diagnosis not present

## 2022-07-18 DIAGNOSIS — I5032 Chronic diastolic (congestive) heart failure: Secondary | ICD-10-CM | POA: Diagnosis not present

## 2022-07-18 NOTE — Telephone Encounter (Signed)
   Telephone encounter was:  Unsuccessful.  07/18/2022 Name: [name] MRN: [id number] DOB: [birth year]  Unsuccessful outbound call made today to assist with:  Caregiver Stress  Outreach Attempt:  2nd Attempt  A HIPAA compliant voice message was left requesting a return call.  Instructed patient to call back at [phone].  [place] [phone] [address] , [place] [address] Email : [email or web address]

## 2022-07-22 DIAGNOSIS — I69351 Hemiplegia and hemiparesis following cerebral infarction affecting right dominant side: Secondary | ICD-10-CM | POA: Diagnosis not present

## 2022-07-22 DIAGNOSIS — C9000 Multiple myeloma not having achieved remission: Secondary | ICD-10-CM | POA: Diagnosis not present

## 2022-07-22 DIAGNOSIS — I130 Hypertensive heart and chronic kidney disease with heart failure and stage 1 through stage 4 chronic kidney disease, or unspecified chronic kidney disease: Secondary | ICD-10-CM | POA: Diagnosis not present

## 2022-07-22 DIAGNOSIS — I4821 Permanent atrial fibrillation: Secondary | ICD-10-CM | POA: Diagnosis not present

## 2022-07-22 DIAGNOSIS — N390 Urinary tract infection, site not specified: Secondary | ICD-10-CM | POA: Diagnosis not present

## 2022-07-22 DIAGNOSIS — J449 Chronic obstructive pulmonary disease, unspecified: Secondary | ICD-10-CM | POA: Diagnosis not present

## 2022-07-22 DIAGNOSIS — I4729 Other ventricular tachycardia: Secondary | ICD-10-CM | POA: Diagnosis not present

## 2022-07-22 DIAGNOSIS — I872 Venous insufficiency (chronic) (peripheral): Secondary | ICD-10-CM | POA: Diagnosis not present

## 2022-07-22 DIAGNOSIS — I5032 Chronic diastolic (congestive) heart failure: Secondary | ICD-10-CM | POA: Diagnosis not present

## 2022-07-22 DIAGNOSIS — I69322 Dysarthria following cerebral infarction: Secondary | ICD-10-CM | POA: Diagnosis not present

## 2022-07-22 DIAGNOSIS — M160 Bilateral primary osteoarthritis of hip: Secondary | ICD-10-CM | POA: Diagnosis not present

## 2022-07-22 DIAGNOSIS — I495 Sick sinus syndrome: Secondary | ICD-10-CM | POA: Diagnosis not present

## 2022-07-22 DIAGNOSIS — E1142 Type 2 diabetes mellitus with diabetic polyneuropathy: Secondary | ICD-10-CM | POA: Diagnosis not present

## 2022-07-22 DIAGNOSIS — M47812 Spondylosis without myelopathy or radiculopathy, cervical region: Secondary | ICD-10-CM | POA: Diagnosis not present

## 2022-07-22 DIAGNOSIS — Z8616 Personal history of COVID-19: Secondary | ICD-10-CM | POA: Diagnosis not present

## 2022-07-22 DIAGNOSIS — G8929 Other chronic pain: Secondary | ICD-10-CM | POA: Diagnosis not present

## 2022-07-22 DIAGNOSIS — K5730 Diverticulosis of large intestine without perforation or abscess without bleeding: Secondary | ICD-10-CM | POA: Diagnosis not present

## 2022-07-22 DIAGNOSIS — E1151 Type 2 diabetes mellitus with diabetic peripheral angiopathy without gangrene: Secondary | ICD-10-CM | POA: Diagnosis not present

## 2022-07-22 DIAGNOSIS — E785 Hyperlipidemia, unspecified: Secondary | ICD-10-CM | POA: Diagnosis not present

## 2022-07-22 DIAGNOSIS — N1832 Chronic kidney disease, stage 3b: Secondary | ICD-10-CM | POA: Diagnosis not present

## 2022-07-22 DIAGNOSIS — S065X0D Traumatic subdural hemorrhage without loss of consciousness, subsequent encounter: Secondary | ICD-10-CM | POA: Diagnosis not present

## 2022-07-22 DIAGNOSIS — E1122 Type 2 diabetes mellitus with diabetic chronic kidney disease: Secondary | ICD-10-CM | POA: Diagnosis not present

## 2022-07-23 ENCOUNTER — Telehealth: Payer: Self-pay | Admitting: *Deleted

## 2022-07-23 NOTE — Telephone Encounter (Signed)
   Telephone encounter was:  Successful.  07/23/2022 Name: [name] MRN: [id number] DOB: 04/24/1946  [name] is a 77 y.o. year old male who is a primary care patient of Jenny Reichmann, Hunt Oris, MD . The community resource team was consulted for assistance with Caregiver Stress Patient will be going to SNF or assisted living as he can no longer reside alone , daughter had no other needs  Care guide performed the following interventions: Patient provided with information about care guide support team and interviewed to confirm resource needs.  Follow Up Plan:  No further follow up planned at this time. The patient has been provided with needed resources.  [place] [phone] [address] , [place] [address] Email : [email or web address]

## 2022-07-24 DIAGNOSIS — I4821 Permanent atrial fibrillation: Secondary | ICD-10-CM | POA: Diagnosis not present

## 2022-07-24 DIAGNOSIS — E785 Hyperlipidemia, unspecified: Secondary | ICD-10-CM | POA: Diagnosis not present

## 2022-07-24 DIAGNOSIS — G8929 Other chronic pain: Secondary | ICD-10-CM | POA: Diagnosis not present

## 2022-07-24 DIAGNOSIS — I872 Venous insufficiency (chronic) (peripheral): Secondary | ICD-10-CM | POA: Diagnosis not present

## 2022-07-24 DIAGNOSIS — N1832 Chronic kidney disease, stage 3b: Secondary | ICD-10-CM | POA: Diagnosis not present

## 2022-07-24 DIAGNOSIS — J449 Chronic obstructive pulmonary disease, unspecified: Secondary | ICD-10-CM | POA: Diagnosis not present

## 2022-07-24 DIAGNOSIS — M160 Bilateral primary osteoarthritis of hip: Secondary | ICD-10-CM | POA: Diagnosis not present

## 2022-07-24 DIAGNOSIS — E1151 Type 2 diabetes mellitus with diabetic peripheral angiopathy without gangrene: Secondary | ICD-10-CM | POA: Diagnosis not present

## 2022-07-24 DIAGNOSIS — E1142 Type 2 diabetes mellitus with diabetic polyneuropathy: Secondary | ICD-10-CM | POA: Diagnosis not present

## 2022-07-24 DIAGNOSIS — M47812 Spondylosis without myelopathy or radiculopathy, cervical region: Secondary | ICD-10-CM | POA: Diagnosis not present

## 2022-07-24 DIAGNOSIS — I69351 Hemiplegia and hemiparesis following cerebral infarction affecting right dominant side: Secondary | ICD-10-CM | POA: Diagnosis not present

## 2022-07-24 DIAGNOSIS — I5032 Chronic diastolic (congestive) heart failure: Secondary | ICD-10-CM | POA: Diagnosis not present

## 2022-07-24 DIAGNOSIS — I69322 Dysarthria following cerebral infarction: Secondary | ICD-10-CM | POA: Diagnosis not present

## 2022-07-24 DIAGNOSIS — I495 Sick sinus syndrome: Secondary | ICD-10-CM | POA: Diagnosis not present

## 2022-07-24 DIAGNOSIS — I4729 Other ventricular tachycardia: Secondary | ICD-10-CM | POA: Diagnosis not present

## 2022-07-24 DIAGNOSIS — E1122 Type 2 diabetes mellitus with diabetic chronic kidney disease: Secondary | ICD-10-CM | POA: Diagnosis not present

## 2022-07-24 DIAGNOSIS — S065X0D Traumatic subdural hemorrhage without loss of consciousness, subsequent encounter: Secondary | ICD-10-CM | POA: Diagnosis not present

## 2022-07-24 DIAGNOSIS — Z8616 Personal history of COVID-19: Secondary | ICD-10-CM | POA: Diagnosis not present

## 2022-07-24 DIAGNOSIS — K5730 Diverticulosis of large intestine without perforation or abscess without bleeding: Secondary | ICD-10-CM | POA: Diagnosis not present

## 2022-07-24 DIAGNOSIS — N390 Urinary tract infection, site not specified: Secondary | ICD-10-CM | POA: Diagnosis not present

## 2022-07-24 DIAGNOSIS — C9000 Multiple myeloma not having achieved remission: Secondary | ICD-10-CM | POA: Diagnosis not present

## 2022-07-24 DIAGNOSIS — I130 Hypertensive heart and chronic kidney disease with heart failure and stage 1 through stage 4 chronic kidney disease, or unspecified chronic kidney disease: Secondary | ICD-10-CM | POA: Diagnosis not present

## 2022-07-28 DIAGNOSIS — N390 Urinary tract infection, site not specified: Secondary | ICD-10-CM | POA: Diagnosis not present

## 2022-07-28 DIAGNOSIS — M160 Bilateral primary osteoarthritis of hip: Secondary | ICD-10-CM | POA: Diagnosis not present

## 2022-07-28 DIAGNOSIS — E1142 Type 2 diabetes mellitus with diabetic polyneuropathy: Secondary | ICD-10-CM | POA: Diagnosis not present

## 2022-07-28 DIAGNOSIS — K5730 Diverticulosis of large intestine without perforation or abscess without bleeding: Secondary | ICD-10-CM | POA: Diagnosis not present

## 2022-07-28 DIAGNOSIS — I495 Sick sinus syndrome: Secondary | ICD-10-CM | POA: Diagnosis not present

## 2022-07-28 DIAGNOSIS — Z8616 Personal history of COVID-19: Secondary | ICD-10-CM | POA: Diagnosis not present

## 2022-07-28 DIAGNOSIS — I69322 Dysarthria following cerebral infarction: Secondary | ICD-10-CM | POA: Diagnosis not present

## 2022-07-28 DIAGNOSIS — M47812 Spondylosis without myelopathy or radiculopathy, cervical region: Secondary | ICD-10-CM | POA: Diagnosis not present

## 2022-07-28 DIAGNOSIS — I4821 Permanent atrial fibrillation: Secondary | ICD-10-CM | POA: Diagnosis not present

## 2022-07-28 DIAGNOSIS — E1122 Type 2 diabetes mellitus with diabetic chronic kidney disease: Secondary | ICD-10-CM | POA: Diagnosis not present

## 2022-07-28 DIAGNOSIS — I5032 Chronic diastolic (congestive) heart failure: Secondary | ICD-10-CM | POA: Diagnosis not present

## 2022-07-28 DIAGNOSIS — J449 Chronic obstructive pulmonary disease, unspecified: Secondary | ICD-10-CM | POA: Diagnosis not present

## 2022-07-28 DIAGNOSIS — I4729 Other ventricular tachycardia: Secondary | ICD-10-CM | POA: Diagnosis not present

## 2022-07-28 DIAGNOSIS — E1151 Type 2 diabetes mellitus with diabetic peripheral angiopathy without gangrene: Secondary | ICD-10-CM | POA: Diagnosis not present

## 2022-07-28 DIAGNOSIS — C9000 Multiple myeloma not having achieved remission: Secondary | ICD-10-CM | POA: Diagnosis not present

## 2022-07-28 DIAGNOSIS — N1832 Chronic kidney disease, stage 3b: Secondary | ICD-10-CM | POA: Diagnosis not present

## 2022-07-28 DIAGNOSIS — S065X0D Traumatic subdural hemorrhage without loss of consciousness, subsequent encounter: Secondary | ICD-10-CM | POA: Diagnosis not present

## 2022-07-28 DIAGNOSIS — I130 Hypertensive heart and chronic kidney disease with heart failure and stage 1 through stage 4 chronic kidney disease, or unspecified chronic kidney disease: Secondary | ICD-10-CM | POA: Diagnosis not present

## 2022-07-28 DIAGNOSIS — I69351 Hemiplegia and hemiparesis following cerebral infarction affecting right dominant side: Secondary | ICD-10-CM | POA: Diagnosis not present

## 2022-07-28 DIAGNOSIS — I872 Venous insufficiency (chronic) (peripheral): Secondary | ICD-10-CM | POA: Diagnosis not present

## 2022-07-28 DIAGNOSIS — G8929 Other chronic pain: Secondary | ICD-10-CM | POA: Diagnosis not present

## 2022-07-28 DIAGNOSIS — E785 Hyperlipidemia, unspecified: Secondary | ICD-10-CM | POA: Diagnosis not present

## 2022-07-31 ENCOUNTER — Telehealth: Payer: Self-pay | Admitting: Internal Medicine

## 2022-07-31 DIAGNOSIS — I495 Sick sinus syndrome: Secondary | ICD-10-CM | POA: Diagnosis not present

## 2022-07-31 DIAGNOSIS — I69322 Dysarthria following cerebral infarction: Secondary | ICD-10-CM | POA: Diagnosis not present

## 2022-07-31 DIAGNOSIS — S065X0D Traumatic subdural hemorrhage without loss of consciousness, subsequent encounter: Secondary | ICD-10-CM | POA: Diagnosis not present

## 2022-07-31 DIAGNOSIS — C9000 Multiple myeloma not having achieved remission: Secondary | ICD-10-CM | POA: Diagnosis not present

## 2022-07-31 DIAGNOSIS — I69351 Hemiplegia and hemiparesis following cerebral infarction affecting right dominant side: Secondary | ICD-10-CM | POA: Diagnosis not present

## 2022-07-31 DIAGNOSIS — J449 Chronic obstructive pulmonary disease, unspecified: Secondary | ICD-10-CM | POA: Diagnosis not present

## 2022-07-31 DIAGNOSIS — M160 Bilateral primary osteoarthritis of hip: Secondary | ICD-10-CM | POA: Diagnosis not present

## 2022-07-31 DIAGNOSIS — N1832 Chronic kidney disease, stage 3b: Secondary | ICD-10-CM | POA: Diagnosis not present

## 2022-07-31 DIAGNOSIS — I130 Hypertensive heart and chronic kidney disease with heart failure and stage 1 through stage 4 chronic kidney disease, or unspecified chronic kidney disease: Secondary | ICD-10-CM | POA: Diagnosis not present

## 2022-07-31 DIAGNOSIS — E1122 Type 2 diabetes mellitus with diabetic chronic kidney disease: Secondary | ICD-10-CM | POA: Diagnosis not present

## 2022-07-31 DIAGNOSIS — K5730 Diverticulosis of large intestine without perforation or abscess without bleeding: Secondary | ICD-10-CM | POA: Diagnosis not present

## 2022-07-31 DIAGNOSIS — E1151 Type 2 diabetes mellitus with diabetic peripheral angiopathy without gangrene: Secondary | ICD-10-CM | POA: Diagnosis not present

## 2022-07-31 DIAGNOSIS — I4729 Other ventricular tachycardia: Secondary | ICD-10-CM | POA: Diagnosis not present

## 2022-07-31 DIAGNOSIS — I5032 Chronic diastolic (congestive) heart failure: Secondary | ICD-10-CM | POA: Diagnosis not present

## 2022-07-31 DIAGNOSIS — N390 Urinary tract infection, site not specified: Secondary | ICD-10-CM | POA: Diagnosis not present

## 2022-07-31 DIAGNOSIS — E785 Hyperlipidemia, unspecified: Secondary | ICD-10-CM | POA: Diagnosis not present

## 2022-07-31 DIAGNOSIS — G8929 Other chronic pain: Secondary | ICD-10-CM | POA: Diagnosis not present

## 2022-07-31 DIAGNOSIS — M47812 Spondylosis without myelopathy or radiculopathy, cervical region: Secondary | ICD-10-CM | POA: Diagnosis not present

## 2022-07-31 DIAGNOSIS — Z8616 Personal history of COVID-19: Secondary | ICD-10-CM | POA: Diagnosis not present

## 2022-07-31 DIAGNOSIS — E1142 Type 2 diabetes mellitus with diabetic polyneuropathy: Secondary | ICD-10-CM | POA: Diagnosis not present

## 2022-07-31 DIAGNOSIS — I4821 Permanent atrial fibrillation: Secondary | ICD-10-CM | POA: Diagnosis not present

## 2022-07-31 DIAGNOSIS — I872 Venous insufficiency (chronic) (peripheral): Secondary | ICD-10-CM | POA: Diagnosis not present

## 2022-07-31 NOTE — Telephone Encounter (Signed)
Unable to contact OT at this time regarding verbal orders

## 2022-07-31 NOTE — Telephone Encounter (Signed)
Stephanie Occupational Therapist from [facility] called needing orders for the patient to receive Occupational Therapy: 1 time a week for 3 weeks and 2 times a week for 1 week. Best callback number for [place] [place] is [phone].

## 2022-07-31 NOTE — Telephone Encounter (Signed)
Ok for verbals 

## 2022-07-31 NOTE — Telephone Encounter (Signed)
Please advise of verbal orders for Occupational Therapy: 1 time a week for 3 weeks and 2 times a week for 1 week

## 2022-08-01 NOTE — Telephone Encounter (Signed)
Unable to contact OT from [facility] at number provided, line just keeps ringing with no voicemail.

## 2022-08-04 ENCOUNTER — Encounter: Payer: Self-pay | Admitting: Internal Medicine

## 2022-08-06 ENCOUNTER — Encounter: Payer: Self-pay | Admitting: Internal Medicine

## 2022-08-06 NOTE — Progress Notes (Signed)
Subjective:    Patient ID: [name], male    DOB: 23-Sep-1945, 77 y.o.   MRN: [id number]     HPI [name] is here for follow up from the hospital  Admitted 12/16-12/19 for UTI  H/o PAF not on a/c  due to falls, subdural hematoma, chronic ambulation dysfunction uses roller walker, COPD, prostate ca s/p prostatectomy, BPH, HFpEF, CKD, CVA - went ot ED with worsening confusion, weakness and agitation.  He has been more unsteady.  Had recent UTI on 12/6 and started on ampicillin.  He was not able to take abx due to dec intake/ confusion.  Ct chest showed PE in right main pulmonary artery and LUL and LLL pulm arteries.  No right heart strain.  Repeat CT showed resolution of prior subdural hematoma.  Placed on heparin drip.   B/l PE, acute hypoxic resp failure - O2 sat on RA 86%: Ct chest PE in right main pulmonary artery and LUL and LLL pulm arteries On heparin gtt x 48 hrs Echo RV function ok, Doppler neg DVT 98% O2 on 2L O2 Placed on eliquis  Subdural hematoma: Dx 9/15 - Ct showed resolution Neurosurgery ok'd heparin gtt  Acute metabolic encephalopathy: In setting of UTI Improved  Incidental lung nodule: Ct chest showed PE, small R pleural effusion, partially loculated.  No concerning nodules   UTI, hematuria: AU with UTI, U cx 50 K proteous Penneri D/c'd on bactrim x 5 days Hematuria resolved  Non-sustained VT: K, Mg low - repleted Echo normal EF On coreg - cardio inc'd coreg  PVD, cold extremities: Mod RLE PAD B/l toe- brachial index absent Vascular saw pt - advised LDL < 100, A1c < 7  Acute on chronic HFpEF: Lasix held due to inc Cr Echo EF 65%  CKD, 2: Cr 1.3-15   He was discharged home.    Many problems over past few months - very weak, deconditioned.  He has home PT, OT, HHA.  He is making minimal progress.  They have recommended a 21 day rehab.  Dr Jenny Reichmann is addressing this.   Prior to the hospital he was taking KCL once a day and after now  on it twice a day his daughter wonders if he still needs it twice a day    BP has been better at home.  PT checks it regularly.   No other concerns   Medications and allergies reviewed with patient and updated if appropriate.  Current Outpatient Medications on File Prior to Visit  Medication Sig Dispense Refill   apixaban (ELIQUIS) 5 MG TABS tablet Take 1 tablet (5 mg total) by mouth 2 (two) times daily. 60 tablet 3   Blood Glucose Monitoring Suppl (ACCU-CHEK GUIDE ME) w/Device KIT Use as directed twice per day E11.9 1 kit 0   carvedilol (COREG) 6.25 MG tablet Take 1 tablet (6.25 mg total) by mouth 2 (two) times daily with a meal. 60 tablet 3   furosemide (LASIX) 40 MG tablet TAKE 1 TABLET BY MOUTH DAILY .  MAY TAKE AN ADDITIONAL 1 TABLET  BY MOUTH IF NEEDED FOR SWELLING 200 tablet 1   glucose blood (ACCU-CHEK GUIDE) test strip Use as instructed twice per day E11.9 200 each 12   Lancet Devices (ONETOUCH DELICA PLUS LANCING) MISC      Lancets (ONETOUCH DELICA PLUS [id number]) MISC USE ONCE DAILY 100 each 3   potassium chloride (KLOR-CON) 10 MEQ tablet Take 2 tablets (20 mEq total) by mouth daily. 100 tablet  3   PROAIR HFA 108 (90 Base) MCG/ACT inhaler INHALE 2 PUFFS INTO THE  LUNGS EVERY 6 HOURS AS  NEEDED (Patient taking differently: Inhale 2 puffs into the lungs every 6 (six) hours as needed for shortness of breath or wheezing.) 34 g 3   rosuvastatin (CRESTOR) 20 MG tablet Take 20 mg by mouth every evening.     tamsulosin (FLOMAX) 0.4 MG CAPS capsule Take 1 capsule (0.4 mg total) by mouth daily after supper. 30 capsule 1   No current facility-administered medications on file prior to visit.     Review of Systems  Constitutional:  Negative for appetite change (appetite varies), chills and fever.  Respiratory:  Positive for shortness of breath (occ on exertion). Negative for cough and wheezing.   Cardiovascular:  Positive for leg swelling (chronic, baseline). Negative for chest pain and  palpitations.  Gastrointestinal:  Negative for abdominal pain, constipation, diarrhea and nausea.       No gerd  Genitourinary:  Negative for difficulty urinating, dysuria and hematuria.  Skin:  Negative for rash and wound.  Neurological:  Negative for dizziness, light-headedness and headaches.  Hematological:  Does not bruise/bleed easily.       Objective:   Vitals:   08/07/22 1019  BP: (!) 140/84  Pulse: 65  Temp: 98 F (36.7 C)  SpO2: 97%   BP Readings from Last 3 Encounters:  08/07/22 (!) 140/84  07/08/22 136/88  06/25/22 136/80   Wt Readings from Last 3 Encounters:  07/05/22 227 lb 4.7 oz (103.1 kg)  06/25/22 225 lb (102.1 kg)  06/03/22 234 lb 12.8 oz (106.5 kg)   Body mass index is 29.99 kg/m.    Physical Exam Constitutional:      General: He is not in acute distress.    Appearance: Normal appearance. He is not ill-appearing.  HENT:     Head: Normocephalic and atraumatic.  Eyes:     Conjunctiva/sclera: Conjunctivae normal.  Cardiovascular:     Rate and Rhythm: Normal rate. Rhythm irregular.     Heart sounds: Murmur (2/6 sys) heard.  Pulmonary:     Effort: Pulmonary effort is normal. No respiratory distress.     Breath sounds: Normal breath sounds. No wheezing or rales.  Musculoskeletal:     Cervical back: Neck supple.     Right lower leg: Edema (2+ pitting) present.     Left lower leg: Edema (2+ pitting) present.  Lymphadenopathy:     Cervical: No cervical adenopathy.  Skin:    General: Skin is warm and dry.     Findings: No rash.  Neurological:     Mental Status: He is alert. Mental status is at baseline.  Psychiatric:        Mood and Affect: Mood normal.        Lab Results  Component Value Date   WBC 5.8 08/07/2022   HGB 12.8 (L) 08/07/2022   HCT 39.7 08/07/2022   PLT 231.0 08/07/2022   GLUCOSE 120 (H) 08/07/2022   CHOL 102 03/10/2022   TRIG 77.0 03/10/2022   HDL 29.10 (L) 03/10/2022   LDLCALC 57 03/10/2022   ALT 10 08/07/2022   AST  16 08/07/2022   NA 137 08/07/2022   K 3.7 08/07/2022   CL 104 08/07/2022   CREATININE 1.32 08/07/2022   BUN 14 08/07/2022   CO2 27 08/07/2022   TSH 1.363 01/24/2022   PSA 0.00 (L) 11/18/2021   INR 1.1 04/10/2022   HGBA1C 7.0 (H) 03/10/2022  MICROALBUR 176.5 (H) 11/18/2021     Assessment & Plan:    Hypertension: Chronic Controlled at home - being checked by PT BP slightly elevated here today Since controlled at home will not make any changes Continue coreg 6.25 mg bid  Afib: Chronic Following with cardiology Now back on eliquis 5 mg bid since recent PE On coreg 6.25 mg bid Cmp, cbc  Hyperlipidemia: Chronic Continue crestor 20 mg daily  Chronic HF, Bilateral leg edema, hypokalemia: Chronic Leg swelling at baseline - 1 + pitting b/l Continue lasix 40 mg daily, additional tab if needed  Continue KCL 20 meq bid - will check cmp -- will adjust dose if needed  PE: New B/l pulmonary embolism Started back on eliquis 5 mg bid - doing ok on it - should be on it for afib anyway Needs to avoid falls which is his big risk at this time  Physical deconditioning: Related to his recent hospitalizations - has become very weak Doing home PT, OT now They and daughter agrees he needs rehab for 21 days -- PCP is working on this per daughter

## 2022-08-07 ENCOUNTER — Ambulatory Visit (INDEPENDENT_AMBULATORY_CARE_PROVIDER_SITE_OTHER): Payer: Medicare Other | Admitting: Internal Medicine

## 2022-08-07 VITALS — BP 140/84 | HR 65 | Temp 98.0°F | Ht 73.0 in

## 2022-08-07 DIAGNOSIS — I4821 Permanent atrial fibrillation: Secondary | ICD-10-CM | POA: Diagnosis not present

## 2022-08-07 DIAGNOSIS — R600 Localized edema: Secondary | ICD-10-CM | POA: Diagnosis not present

## 2022-08-07 DIAGNOSIS — E785 Hyperlipidemia, unspecified: Secondary | ICD-10-CM | POA: Diagnosis not present

## 2022-08-07 DIAGNOSIS — E876 Hypokalemia: Secondary | ICD-10-CM

## 2022-08-07 DIAGNOSIS — I10 Essential (primary) hypertension: Secondary | ICD-10-CM

## 2022-08-07 DIAGNOSIS — I2699 Other pulmonary embolism without acute cor pulmonale: Secondary | ICD-10-CM | POA: Diagnosis not present

## 2022-08-07 DIAGNOSIS — R5381 Other malaise: Secondary | ICD-10-CM | POA: Diagnosis not present

## 2022-08-07 DIAGNOSIS — I5033 Acute on chronic diastolic (congestive) heart failure: Secondary | ICD-10-CM

## 2022-08-07 LAB — CBC WITH DIFFERENTIAL/PLATELET
Basophils Absolute: 0 10*3/uL (ref 0.0–0.1)
Basophils Relative: 0.6 % (ref 0.0–3.0)
Eosinophils Absolute: 0 10*3/uL (ref 0.0–0.7)
Eosinophils Relative: 0.7 % (ref 0.0–5.0)
HCT: 39.7 % (ref 39.0–52.0)
Hemoglobin: 12.8 g/dL — ABNORMAL LOW (ref 13.0–17.0)
Lymphocytes Relative: 25.9 % (ref 12.0–46.0)
Lymphs Abs: 1.5 10*3/uL (ref 0.7–4.0)
MCHC: 32.2 g/dL (ref 30.0–36.0)
MCV: 84.6 fl (ref 78.0–100.0)
Monocytes Absolute: 0.5 10*3/uL (ref 0.1–1.0)
Monocytes Relative: 8.6 % (ref 3.0–12.0)
Neutro Abs: 3.7 10*3/uL (ref 1.4–7.7)
Neutrophils Relative %: 64.2 % (ref 43.0–77.0)
Platelets: 231 10*3/uL (ref 150.0–400.0)
RBC: 4.7 Mil/uL (ref 4.22–5.81)
RDW: 17.6 % — ABNORMAL HIGH (ref 11.5–15.5)
WBC: 5.8 10*3/uL (ref 4.0–10.5)

## 2022-08-07 LAB — COMPREHENSIVE METABOLIC PANEL
ALT: 10 U/L (ref 0–53)
AST: 16 U/L (ref 0–37)
Albumin: 3.1 g/dL — ABNORMAL LOW (ref 3.5–5.2)
Alkaline Phosphatase: 142 U/L — ABNORMAL HIGH (ref 39–117)
BUN: 14 mg/dL (ref 6–23)
CO2: 27 mEq/L (ref 19–32)
Calcium: 8.9 mg/dL (ref 8.4–10.5)
Chloride: 104 mEq/L (ref 96–112)
Creatinine, Ser: 1.32 mg/dL (ref 0.40–1.50)
GFR: 52.71 mL/min — ABNORMAL LOW (ref >60.00)
Glucose, Bld: 120 mg/dL — ABNORMAL HIGH (ref 70–99)
Potassium: 3.7 mEq/L (ref 3.5–5.1)
Sodium: 137 mEq/L (ref 135–145)
Total Bilirubin: 0.5 mg/dL (ref 0.2–1.2)
Total Protein: 7.9 g/dL (ref 6.0–8.3)

## 2022-08-07 NOTE — Patient Instructions (Addendum)
      Blood work was ordered.   The lab is on the first floor.    Medications changes include :   none      Return in about 4 months (around 12/06/2022) for follow up with Dr Jenny Reichmann.

## 2022-08-08 DIAGNOSIS — I2699 Other pulmonary embolism without acute cor pulmonale: Secondary | ICD-10-CM | POA: Diagnosis not present

## 2022-08-08 DIAGNOSIS — N390 Urinary tract infection, site not specified: Secondary | ICD-10-CM | POA: Diagnosis not present

## 2022-08-08 DIAGNOSIS — R410 Disorientation, unspecified: Secondary | ICD-10-CM | POA: Diagnosis not present

## 2022-08-09 DIAGNOSIS — G8929 Other chronic pain: Secondary | ICD-10-CM | POA: Diagnosis not present

## 2022-08-09 DIAGNOSIS — I495 Sick sinus syndrome: Secondary | ICD-10-CM | POA: Diagnosis not present

## 2022-08-09 DIAGNOSIS — C9000 Multiple myeloma not having achieved remission: Secondary | ICD-10-CM | POA: Diagnosis not present

## 2022-08-09 DIAGNOSIS — I69351 Hemiplegia and hemiparesis following cerebral infarction affecting right dominant side: Secondary | ICD-10-CM | POA: Diagnosis not present

## 2022-08-09 DIAGNOSIS — Z8616 Personal history of COVID-19: Secondary | ICD-10-CM | POA: Diagnosis not present

## 2022-08-09 DIAGNOSIS — I872 Venous insufficiency (chronic) (peripheral): Secondary | ICD-10-CM | POA: Diagnosis not present

## 2022-08-09 DIAGNOSIS — N1832 Chronic kidney disease, stage 3b: Secondary | ICD-10-CM | POA: Diagnosis not present

## 2022-08-09 DIAGNOSIS — I4821 Permanent atrial fibrillation: Secondary | ICD-10-CM | POA: Diagnosis not present

## 2022-08-09 DIAGNOSIS — E1151 Type 2 diabetes mellitus with diabetic peripheral angiopathy without gangrene: Secondary | ICD-10-CM | POA: Diagnosis not present

## 2022-08-09 DIAGNOSIS — K5730 Diverticulosis of large intestine without perforation or abscess without bleeding: Secondary | ICD-10-CM | POA: Diagnosis not present

## 2022-08-09 DIAGNOSIS — N390 Urinary tract infection, site not specified: Secondary | ICD-10-CM | POA: Diagnosis not present

## 2022-08-09 DIAGNOSIS — E785 Hyperlipidemia, unspecified: Secondary | ICD-10-CM | POA: Diagnosis not present

## 2022-08-09 DIAGNOSIS — I4729 Other ventricular tachycardia: Secondary | ICD-10-CM | POA: Diagnosis not present

## 2022-08-09 DIAGNOSIS — I5032 Chronic diastolic (congestive) heart failure: Secondary | ICD-10-CM | POA: Diagnosis not present

## 2022-08-09 DIAGNOSIS — I69322 Dysarthria following cerebral infarction: Secondary | ICD-10-CM | POA: Diagnosis not present

## 2022-08-09 DIAGNOSIS — M160 Bilateral primary osteoarthritis of hip: Secondary | ICD-10-CM | POA: Diagnosis not present

## 2022-08-09 DIAGNOSIS — J449 Chronic obstructive pulmonary disease, unspecified: Secondary | ICD-10-CM | POA: Diagnosis not present

## 2022-08-09 DIAGNOSIS — E1142 Type 2 diabetes mellitus with diabetic polyneuropathy: Secondary | ICD-10-CM | POA: Diagnosis not present

## 2022-08-09 DIAGNOSIS — I130 Hypertensive heart and chronic kidney disease with heart failure and stage 1 through stage 4 chronic kidney disease, or unspecified chronic kidney disease: Secondary | ICD-10-CM | POA: Diagnosis not present

## 2022-08-09 DIAGNOSIS — M47812 Spondylosis without myelopathy or radiculopathy, cervical region: Secondary | ICD-10-CM | POA: Diagnosis not present

## 2022-08-09 DIAGNOSIS — E1122 Type 2 diabetes mellitus with diabetic chronic kidney disease: Secondary | ICD-10-CM | POA: Diagnosis not present

## 2022-08-09 DIAGNOSIS — S065X0D Traumatic subdural hemorrhage without loss of consciousness, subsequent encounter: Secondary | ICD-10-CM | POA: Diagnosis not present

## 2022-08-12 DIAGNOSIS — I872 Venous insufficiency (chronic) (peripheral): Secondary | ICD-10-CM | POA: Diagnosis not present

## 2022-08-12 DIAGNOSIS — E1122 Type 2 diabetes mellitus with diabetic chronic kidney disease: Secondary | ICD-10-CM | POA: Diagnosis not present

## 2022-08-12 DIAGNOSIS — N1832 Chronic kidney disease, stage 3b: Secondary | ICD-10-CM | POA: Diagnosis not present

## 2022-08-12 DIAGNOSIS — I130 Hypertensive heart and chronic kidney disease with heart failure and stage 1 through stage 4 chronic kidney disease, or unspecified chronic kidney disease: Secondary | ICD-10-CM | POA: Diagnosis not present

## 2022-08-12 DIAGNOSIS — E1151 Type 2 diabetes mellitus with diabetic peripheral angiopathy without gangrene: Secondary | ICD-10-CM | POA: Diagnosis not present

## 2022-08-12 DIAGNOSIS — E785 Hyperlipidemia, unspecified: Secondary | ICD-10-CM | POA: Diagnosis not present

## 2022-08-12 DIAGNOSIS — I4729 Other ventricular tachycardia: Secondary | ICD-10-CM | POA: Diagnosis not present

## 2022-08-12 DIAGNOSIS — J449 Chronic obstructive pulmonary disease, unspecified: Secondary | ICD-10-CM | POA: Diagnosis not present

## 2022-08-12 DIAGNOSIS — I5032 Chronic diastolic (congestive) heart failure: Secondary | ICD-10-CM | POA: Diagnosis not present

## 2022-08-12 DIAGNOSIS — N390 Urinary tract infection, site not specified: Secondary | ICD-10-CM | POA: Diagnosis not present

## 2022-08-12 DIAGNOSIS — E1142 Type 2 diabetes mellitus with diabetic polyneuropathy: Secondary | ICD-10-CM | POA: Diagnosis not present

## 2022-08-12 DIAGNOSIS — I69322 Dysarthria following cerebral infarction: Secondary | ICD-10-CM | POA: Diagnosis not present

## 2022-08-12 DIAGNOSIS — C9000 Multiple myeloma not having achieved remission: Secondary | ICD-10-CM | POA: Diagnosis not present

## 2022-08-12 DIAGNOSIS — I495 Sick sinus syndrome: Secondary | ICD-10-CM | POA: Diagnosis not present

## 2022-08-12 DIAGNOSIS — Z8616 Personal history of COVID-19: Secondary | ICD-10-CM | POA: Diagnosis not present

## 2022-08-12 DIAGNOSIS — I4821 Permanent atrial fibrillation: Secondary | ICD-10-CM | POA: Diagnosis not present

## 2022-08-12 DIAGNOSIS — I69351 Hemiplegia and hemiparesis following cerebral infarction affecting right dominant side: Secondary | ICD-10-CM | POA: Diagnosis not present

## 2022-08-12 DIAGNOSIS — G8929 Other chronic pain: Secondary | ICD-10-CM | POA: Diagnosis not present

## 2022-08-12 DIAGNOSIS — M160 Bilateral primary osteoarthritis of hip: Secondary | ICD-10-CM | POA: Diagnosis not present

## 2022-08-12 DIAGNOSIS — M47812 Spondylosis without myelopathy or radiculopathy, cervical region: Secondary | ICD-10-CM | POA: Diagnosis not present

## 2022-08-12 DIAGNOSIS — S065X0D Traumatic subdural hemorrhage without loss of consciousness, subsequent encounter: Secondary | ICD-10-CM | POA: Diagnosis not present

## 2022-08-12 DIAGNOSIS — K5730 Diverticulosis of large intestine without perforation or abscess without bleeding: Secondary | ICD-10-CM | POA: Diagnosis not present

## 2022-08-14 DIAGNOSIS — N390 Urinary tract infection, site not specified: Secondary | ICD-10-CM | POA: Diagnosis not present

## 2022-08-14 DIAGNOSIS — I4729 Other ventricular tachycardia: Secondary | ICD-10-CM | POA: Diagnosis not present

## 2022-08-14 DIAGNOSIS — E1151 Type 2 diabetes mellitus with diabetic peripheral angiopathy without gangrene: Secondary | ICD-10-CM | POA: Diagnosis not present

## 2022-08-14 DIAGNOSIS — Z8616 Personal history of COVID-19: Secondary | ICD-10-CM | POA: Diagnosis not present

## 2022-08-14 DIAGNOSIS — I130 Hypertensive heart and chronic kidney disease with heart failure and stage 1 through stage 4 chronic kidney disease, or unspecified chronic kidney disease: Secondary | ICD-10-CM | POA: Diagnosis not present

## 2022-08-14 DIAGNOSIS — I5032 Chronic diastolic (congestive) heart failure: Secondary | ICD-10-CM | POA: Diagnosis not present

## 2022-08-14 DIAGNOSIS — M160 Bilateral primary osteoarthritis of hip: Secondary | ICD-10-CM | POA: Diagnosis not present

## 2022-08-14 DIAGNOSIS — M47812 Spondylosis without myelopathy or radiculopathy, cervical region: Secondary | ICD-10-CM | POA: Diagnosis not present

## 2022-08-14 DIAGNOSIS — N1832 Chronic kidney disease, stage 3b: Secondary | ICD-10-CM | POA: Diagnosis not present

## 2022-08-14 DIAGNOSIS — C9000 Multiple myeloma not having achieved remission: Secondary | ICD-10-CM | POA: Diagnosis not present

## 2022-08-14 DIAGNOSIS — E1142 Type 2 diabetes mellitus with diabetic polyneuropathy: Secondary | ICD-10-CM | POA: Diagnosis not present

## 2022-08-14 DIAGNOSIS — I69351 Hemiplegia and hemiparesis following cerebral infarction affecting right dominant side: Secondary | ICD-10-CM | POA: Diagnosis not present

## 2022-08-14 DIAGNOSIS — E785 Hyperlipidemia, unspecified: Secondary | ICD-10-CM | POA: Diagnosis not present

## 2022-08-14 DIAGNOSIS — K5730 Diverticulosis of large intestine without perforation or abscess without bleeding: Secondary | ICD-10-CM | POA: Diagnosis not present

## 2022-08-14 DIAGNOSIS — G8929 Other chronic pain: Secondary | ICD-10-CM | POA: Diagnosis not present

## 2022-08-14 DIAGNOSIS — I495 Sick sinus syndrome: Secondary | ICD-10-CM | POA: Diagnosis not present

## 2022-08-14 DIAGNOSIS — I4821 Permanent atrial fibrillation: Secondary | ICD-10-CM | POA: Diagnosis not present

## 2022-08-14 DIAGNOSIS — E1122 Type 2 diabetes mellitus with diabetic chronic kidney disease: Secondary | ICD-10-CM | POA: Diagnosis not present

## 2022-08-14 DIAGNOSIS — I69322 Dysarthria following cerebral infarction: Secondary | ICD-10-CM | POA: Diagnosis not present

## 2022-08-14 DIAGNOSIS — J449 Chronic obstructive pulmonary disease, unspecified: Secondary | ICD-10-CM | POA: Diagnosis not present

## 2022-08-14 DIAGNOSIS — I872 Venous insufficiency (chronic) (peripheral): Secondary | ICD-10-CM | POA: Diagnosis not present

## 2022-08-14 DIAGNOSIS — S065X0D Traumatic subdural hemorrhage without loss of consciousness, subsequent encounter: Secondary | ICD-10-CM | POA: Diagnosis not present

## 2022-08-16 ENCOUNTER — Emergency Department (HOSPITAL_COMMUNITY): Payer: Medicare Other

## 2022-08-16 ENCOUNTER — Encounter (HOSPITAL_COMMUNITY): Payer: Self-pay

## 2022-08-16 ENCOUNTER — Inpatient Hospital Stay (HOSPITAL_COMMUNITY)
Admission: EM | Admit: 2022-08-16 | Discharge: 2022-08-23 | DRG: 193 | Disposition: A | Discharge status: Skilled Nursing Facility | Payer: Medicare Other | Attending: Internal Medicine | Admitting: Internal Medicine

## 2022-08-16 DIAGNOSIS — J101 Influenza due to other identified influenza virus with other respiratory manifestations: Secondary | ICD-10-CM | POA: Diagnosis not present

## 2022-08-16 DIAGNOSIS — M549 Dorsalgia, unspecified: Secondary | ICD-10-CM | POA: Diagnosis present

## 2022-08-16 DIAGNOSIS — N400 Enlarged prostate without lower urinary tract symptoms: Secondary | ICD-10-CM | POA: Diagnosis present

## 2022-08-16 DIAGNOSIS — Z7901 Long term (current) use of anticoagulants: Secondary | ICD-10-CM

## 2022-08-16 DIAGNOSIS — E669 Obesity, unspecified: Secondary | ICD-10-CM | POA: Diagnosis present

## 2022-08-16 DIAGNOSIS — R410 Disorientation, unspecified: Secondary | ICD-10-CM | POA: Diagnosis not present

## 2022-08-16 DIAGNOSIS — R1312 Dysphagia, oropharyngeal phase: Secondary | ICD-10-CM | POA: Diagnosis not present

## 2022-08-16 DIAGNOSIS — E871 Hypo-osmolality and hyponatremia: Secondary | ICD-10-CM | POA: Diagnosis not present

## 2022-08-16 DIAGNOSIS — G9341 Metabolic encephalopathy: Secondary | ICD-10-CM | POA: Diagnosis present

## 2022-08-16 DIAGNOSIS — E872 Acidosis, unspecified: Secondary | ICD-10-CM | POA: Diagnosis not present

## 2022-08-16 DIAGNOSIS — I472 Ventricular tachycardia, unspecified: Secondary | ICD-10-CM | POA: Diagnosis not present

## 2022-08-16 DIAGNOSIS — I5032 Chronic diastolic (congestive) heart failure: Secondary | ICD-10-CM | POA: Diagnosis present

## 2022-08-16 DIAGNOSIS — M6281 Muscle weakness (generalized): Secondary | ICD-10-CM | POA: Diagnosis not present

## 2022-08-16 DIAGNOSIS — Z881 Allergy status to other antibiotic agents status: Secondary | ICD-10-CM

## 2022-08-16 DIAGNOSIS — G8929 Other chronic pain: Secondary | ICD-10-CM | POA: Diagnosis present

## 2022-08-16 DIAGNOSIS — Z1152 Encounter for screening for COVID-19: Secondary | ICD-10-CM

## 2022-08-16 DIAGNOSIS — Z86711 Personal history of pulmonary embolism: Secondary | ICD-10-CM | POA: Diagnosis present

## 2022-08-16 DIAGNOSIS — I482 Chronic atrial fibrillation, unspecified: Secondary | ICD-10-CM | POA: Diagnosis present

## 2022-08-16 DIAGNOSIS — Z87891 Personal history of nicotine dependence: Secondary | ICD-10-CM | POA: Diagnosis not present

## 2022-08-16 DIAGNOSIS — N390 Urinary tract infection, site not specified: Secondary | ICD-10-CM | POA: Diagnosis not present

## 2022-08-16 DIAGNOSIS — E119 Type 2 diabetes mellitus without complications: Secondary | ICD-10-CM

## 2022-08-16 DIAGNOSIS — R7402 Elevation of levels of lactic acid dehydrogenase (LDH): Secondary | ICD-10-CM

## 2022-08-16 DIAGNOSIS — J440 Chronic obstructive pulmonary disease with acute lower respiratory infection: Secondary | ICD-10-CM | POA: Diagnosis not present

## 2022-08-16 DIAGNOSIS — F1021 Alcohol dependence, in remission: Secondary | ICD-10-CM | POA: Diagnosis present

## 2022-08-16 DIAGNOSIS — G9340 Encephalopathy, unspecified: Secondary | ICD-10-CM

## 2022-08-16 DIAGNOSIS — I10 Essential (primary) hypertension: Secondary | ICD-10-CM | POA: Diagnosis not present

## 2022-08-16 DIAGNOSIS — G3184 Mild cognitive impairment, so stated: Secondary | ICD-10-CM | POA: Diagnosis present

## 2022-08-16 DIAGNOSIS — R1311 Dysphagia, oral phase: Secondary | ICD-10-CM | POA: Diagnosis not present

## 2022-08-16 DIAGNOSIS — R6889 Other general symptoms and signs: Secondary | ICD-10-CM | POA: Diagnosis not present

## 2022-08-16 DIAGNOSIS — Z8042 Family history of malignant neoplasm of prostate: Secondary | ICD-10-CM

## 2022-08-16 DIAGNOSIS — I499 Cardiac arrhythmia, unspecified: Secondary | ICD-10-CM | POA: Diagnosis not present

## 2022-08-16 DIAGNOSIS — Z833 Family history of diabetes mellitus: Secondary | ICD-10-CM

## 2022-08-16 DIAGNOSIS — R7401 Elevation of levels of liver transaminase levels: Secondary | ICD-10-CM | POA: Diagnosis not present

## 2022-08-16 DIAGNOSIS — Z6831 Body mass index (BMI) 31.0-31.9, adult: Secondary | ICD-10-CM

## 2022-08-16 DIAGNOSIS — R404 Transient alteration of awareness: Secondary | ICD-10-CM | POA: Diagnosis not present

## 2022-08-16 DIAGNOSIS — Z79899 Other long term (current) drug therapy: Secondary | ICD-10-CM

## 2022-08-16 DIAGNOSIS — E785 Hyperlipidemia, unspecified: Secondary | ICD-10-CM | POA: Diagnosis not present

## 2022-08-16 DIAGNOSIS — E1140 Type 2 diabetes mellitus with diabetic neuropathy, unspecified: Secondary | ICD-10-CM | POA: Diagnosis present

## 2022-08-16 DIAGNOSIS — R2689 Other abnormalities of gait and mobility: Secondary | ICD-10-CM | POA: Diagnosis not present

## 2022-08-16 DIAGNOSIS — E1122 Type 2 diabetes mellitus with diabetic chronic kidney disease: Secondary | ICD-10-CM | POA: Diagnosis not present

## 2022-08-16 DIAGNOSIS — J111 Influenza due to unidentified influenza virus with other respiratory manifestations: Secondary | ICD-10-CM | POA: Diagnosis present

## 2022-08-16 DIAGNOSIS — R531 Weakness: Secondary | ICD-10-CM | POA: Diagnosis not present

## 2022-08-16 DIAGNOSIS — I4729 Other ventricular tachycardia: Secondary | ICD-10-CM | POA: Diagnosis present

## 2022-08-16 DIAGNOSIS — Z8673 Personal history of transient ischemic attack (TIA), and cerebral infarction without residual deficits: Secondary | ICD-10-CM

## 2022-08-16 DIAGNOSIS — Z7401 Bed confinement status: Secondary | ICD-10-CM | POA: Diagnosis not present

## 2022-08-16 DIAGNOSIS — R2681 Unsteadiness on feet: Secondary | ICD-10-CM | POA: Diagnosis not present

## 2022-08-16 DIAGNOSIS — N1832 Chronic kidney disease, stage 3b: Secondary | ICD-10-CM | POA: Diagnosis not present

## 2022-08-16 DIAGNOSIS — I130 Hypertensive heart and chronic kidney disease with heart failure and stage 1 through stage 4 chronic kidney disease, or unspecified chronic kidney disease: Secondary | ICD-10-CM | POA: Diagnosis present

## 2022-08-16 DIAGNOSIS — Z8249 Family history of ischemic heart disease and other diseases of the circulatory system: Secondary | ICD-10-CM

## 2022-08-16 DIAGNOSIS — J449 Chronic obstructive pulmonary disease, unspecified: Secondary | ICD-10-CM | POA: Diagnosis present

## 2022-08-16 DIAGNOSIS — Z743 Need for continuous supervision: Secondary | ICD-10-CM | POA: Diagnosis not present

## 2022-08-16 DIAGNOSIS — Z8546 Personal history of malignant neoplasm of prostate: Secondary | ICD-10-CM | POA: Diagnosis not present

## 2022-08-16 DIAGNOSIS — E875 Hyperkalemia: Secondary | ICD-10-CM | POA: Diagnosis present

## 2022-08-16 DIAGNOSIS — I890 Lymphedema, not elsewhere classified: Secondary | ICD-10-CM | POA: Diagnosis present

## 2022-08-16 DIAGNOSIS — I493 Ventricular premature depolarization: Secondary | ICD-10-CM | POA: Diagnosis present

## 2022-08-16 LAB — GLUCOSE, CAPILLARY: Glucose-Capillary: 87 mg/dL (ref 70–99)

## 2022-08-16 LAB — MAGNESIUM: Magnesium: 1.3 mg/dL — ABNORMAL LOW (ref 1.7–2.4)

## 2022-08-16 LAB — URINALYSIS, ROUTINE W REFLEX MICROSCOPIC
Bilirubin Urine: NEGATIVE
Glucose, UA: NEGATIVE mg/dL
Ketones, ur: NEGATIVE mg/dL
Leukocytes,Ua: NEGATIVE
Nitrite: NEGATIVE
Protein, ur: >=300 mg/dL — AB
Specific Gravity, Urine: 1.012 (ref 1.005–1.030)
pH: 6 (ref 5.0–8.0)

## 2022-08-16 LAB — CBC
HCT: 40.5 % (ref 39.0–52.0)
Hemoglobin: 12.4 g/dL — ABNORMAL LOW (ref 13.0–17.0)
MCH: 26.8 pg (ref 26.0–34.0)
MCHC: 30.6 g/dL (ref 30.0–36.0)
MCV: 87.7 fL (ref 80.0–100.0)
Platelets: 176 10*3/uL (ref 150–400)
RBC: 4.62 MIL/uL (ref 4.22–5.81)
RDW: 16.9 % — ABNORMAL HIGH (ref 11.5–15.5)
WBC: 6.6 10*3/uL (ref 4.0–10.5)
nRBC: 0 % (ref 0.0–0.2)

## 2022-08-16 LAB — CBC WITH DIFFERENTIAL/PLATELET
Abs Immature Granulocytes: 0.03 10*3/uL (ref 0.00–0.07)
Basophils Absolute: 0 10*3/uL (ref 0.0–0.1)
Basophils Relative: 0 %
Eosinophils Absolute: 0 10*3/uL (ref 0.0–0.5)
Eosinophils Relative: 0 %
HCT: 40.2 % (ref 39.0–52.0)
Hemoglobin: 12.9 g/dL — ABNORMAL LOW (ref 13.0–17.0)
Immature Granulocytes: 0 %
Lymphocytes Relative: 13 %
Lymphs Abs: 0.9 10*3/uL (ref 0.7–4.0)
MCH: 27.7 pg (ref 26.0–34.0)
MCHC: 32.1 g/dL (ref 30.0–36.0)
MCV: 86.3 fL (ref 80.0–100.0)
Monocytes Absolute: 0.6 10*3/uL (ref 0.1–1.0)
Monocytes Relative: 8 %
Neutro Abs: 5.2 10*3/uL (ref 1.7–7.7)
Neutrophils Relative %: 79 %
Platelets: 188 10*3/uL (ref 150–400)
RBC: 4.66 MIL/uL (ref 4.22–5.81)
RDW: 16.8 % — ABNORMAL HIGH (ref 11.5–15.5)
WBC: 6.7 10*3/uL (ref 4.0–10.5)
nRBC: 0 % (ref 0.0–0.2)

## 2022-08-16 LAB — LACTIC ACID, PLASMA
Lactic Acid, Venous: 3.3 mmol/L (ref 0.5–1.9)
Lactic Acid, Venous: 2.5 mmol/L (ref 0.5–1.9)

## 2022-08-16 LAB — RESP PANEL BY RT-PCR (RSV, FLU A&B, COVID)  RVPGX2
Influenza A by PCR: POSITIVE — AB
Influenza B by PCR: NEGATIVE
Resp Syncytial Virus by PCR: NEGATIVE
SARS Coronavirus 2 by RT PCR: NEGATIVE

## 2022-08-16 LAB — COMPREHENSIVE METABOLIC PANEL
ALT: 12 U/L (ref 0–44)
AST: 32 U/L (ref 15–41)
Albumin: 2.4 g/dL — ABNORMAL LOW (ref 3.5–5.0)
Alkaline Phosphatase: 122 U/L (ref 38–126)
Anion gap: 9 (ref 5–15)
BUN: 12 mg/dL (ref 8–23)
CO2: 20 mmol/L — ABNORMAL LOW (ref 22–32)
Calcium: 7.7 mg/dL — ABNORMAL LOW (ref 8.9–10.3)
Chloride: 103 mmol/L (ref 98–111)
Creatinine, Ser: 1.24 mg/dL (ref 0.61–1.24)
GFR, Estimated: >60 mL/min (ref >60)
Glucose, Bld: 95 mg/dL (ref 70–99)
Potassium: 5.3 mmol/L — ABNORMAL HIGH (ref 3.5–5.1)
Sodium: 132 mmol/L — ABNORMAL LOW (ref 135–145)
Total Bilirubin: 0.8 mg/dL (ref 0.3–1.2)
Total Protein: 6.8 g/dL (ref 6.5–8.1)

## 2022-08-16 LAB — CBG MONITORING, ED
Glucose-Capillary: 108 mg/dL — ABNORMAL HIGH (ref 70–99)
Glucose-Capillary: 122 mg/dL — ABNORMAL HIGH (ref 70–99)

## 2022-08-16 LAB — PROTIME-INR
INR: 1.5 — ABNORMAL HIGH (ref 0.8–1.2)
Prothrombin Time: 17.5 seconds — ABNORMAL HIGH (ref 11.4–15.2)

## 2022-08-16 MED ORDER — SODIUM CHLORIDE 0.9% FLUSH
3.0000 mL | Freq: Two times a day (BID) | INTRAVENOUS | Status: DC
  Administered 2022-08-17 – 2022-08-23 (×12): 3 mL via INTRAVENOUS

## 2022-08-16 MED ORDER — IPRATROPIUM-ALBUTEROL 0.5-2.5 (3) MG/3ML IN SOLN
3.0000 mL | Freq: Once | RESPIRATORY_TRACT | Status: DC
  Filled 2022-08-16: qty 3

## 2022-08-16 MED ORDER — INSULIN ASPART 100 UNIT/ML IJ SOLN
0.0000 [IU] | Freq: Three times a day (TID) | INTRAMUSCULAR | Status: DC
  Administered 2022-08-16: 2 [IU] via SUBCUTANEOUS

## 2022-08-16 MED ORDER — OXYCODONE HCL 5 MG PO TABS
5.0000 mg | ORAL_TABLET | ORAL | Status: DC | PRN
  Administered 2022-08-19 – 2022-08-22 (×3): 5 mg via ORAL
  Filled 2022-08-16 (×3): qty 1

## 2022-08-16 MED ORDER — TAMSULOSIN HCL 0.4 MG PO CAPS
0.4000 mg | ORAL_CAPSULE | Freq: Every day | ORAL | Status: DC
  Administered 2022-08-17 – 2022-08-22 (×6): 0.4 mg via ORAL
  Filled 2022-08-16 (×6): qty 1

## 2022-08-16 MED ORDER — ACETAMINOPHEN 650 MG RE SUPP
650.0000 mg | Freq: Four times a day (QID) | RECTAL | Status: DC | PRN
  Administered 2022-08-16 – 2022-08-17 (×2): 650 mg via RECTAL
  Filled 2022-08-16 (×3): qty 1

## 2022-08-16 MED ORDER — ROSUVASTATIN CALCIUM 20 MG PO TABS
20.0000 mg | ORAL_TABLET | Freq: Every evening | ORAL | Status: DC
  Administered 2022-08-17 – 2022-08-22 (×6): 20 mg via ORAL
  Filled 2022-08-16 (×6): qty 1

## 2022-08-16 MED ORDER — ONDANSETRON HCL 4 MG/2ML IJ SOLN: 4.0000 mg | Freq: Four times a day (QID) | INTRAMUSCULAR | Status: DC | PRN

## 2022-08-16 MED ORDER — BISACODYL 5 MG PO TBEC: 5.0000 mg | DELAYED_RELEASE_TABLET | Freq: Every day | ORAL | Status: DC | PRN

## 2022-08-16 MED ORDER — POLYETHYLENE GLYCOL 3350 17 G PO PACK: 17.0000 g | PACK | Freq: Every day | ORAL | Status: DC | PRN

## 2022-08-16 MED ORDER — SODIUM CHLORIDE 0.9 % IV BOLUS
1000.0000 mL | Freq: Once | INTRAVENOUS | Status: AC
  Administered 2022-08-16: 1000 mL via INTRAVENOUS

## 2022-08-16 MED ORDER — ALBUTEROL SULFATE HFA 108 (90 BASE) MCG/ACT IN AERS
2.0000 | INHALATION_SPRAY | Freq: Once | RESPIRATORY_TRACT | Status: AC
  Administered 2022-08-16: 2 via RESPIRATORY_TRACT

## 2022-08-16 MED ORDER — APIXABAN 5 MG PO TABS: 5.0000 mg | ORAL_TABLET | Freq: Two times a day (BID) | ORAL | Status: DC

## 2022-08-16 MED ORDER — ONDANSETRON HCL 4 MG PO TABS: 4.0000 mg | ORAL_TABLET | Freq: Four times a day (QID) | ORAL | Status: DC | PRN

## 2022-08-16 MED ORDER — HEPARIN (PORCINE) 25000 UT/250ML-% IV SOLN
1450.0000 [IU]/h | INTRAVENOUS | Status: DC
  Administered 2022-08-17: 1450 [IU]/h via INTRAVENOUS
  Filled 2022-08-16: qty 250

## 2022-08-16 MED ORDER — DEXTROSE 50 % IV SOLN: 1.0000 | INTRAVENOUS | Status: DC | PRN

## 2022-08-16 MED ORDER — HYDRALAZINE HCL 20 MG/ML IJ SOLN: 5.0000 mg | INTRAMUSCULAR | Status: DC | PRN

## 2022-08-16 MED ORDER — DEXTROSE IN LACTATED RINGERS 5 % IV SOLN: INTRAVENOUS | Status: AC

## 2022-08-16 MED ORDER — ACETAMINOPHEN 325 MG PO TABS
650.0000 mg | ORAL_TABLET | Freq: Four times a day (QID) | ORAL | Status: DC | PRN
  Administered 2022-08-17: 650 mg via ORAL
  Filled 2022-08-16: qty 2

## 2022-08-16 MED ORDER — DOCUSATE SODIUM 100 MG PO CAPS
100.0000 mg | ORAL_CAPSULE | Freq: Two times a day (BID) | ORAL | Status: DC
  Administered 2022-08-17 – 2022-08-19 (×4): 100 mg via ORAL
  Filled 2022-08-16 (×4): qty 1

## 2022-08-16 MED ORDER — LACTATED RINGERS IV SOLN: INTRAVENOUS | Status: DC

## 2022-08-16 MED ORDER — ALBUTEROL SULFATE (2.5 MG/3ML) 0.083% IN NEBU
2.5000 mg | INHALATION_SOLUTION | RESPIRATORY_TRACT | Status: DC | PRN
  Administered 2022-08-16: 2.5 mg via RESPIRATORY_TRACT
  Filled 2022-08-16: qty 3

## 2022-08-16 MED ORDER — OSELTAMIVIR PHOSPHATE 75 MG PO CAPS
75.0000 mg | ORAL_CAPSULE | Freq: Two times a day (BID) | ORAL | Status: AC
  Administered 2022-08-17 – 2022-08-21 (×9): 75 mg via ORAL
  Filled 2022-08-16 (×9): qty 1

## 2022-08-16 MED ORDER — CARVEDILOL 6.25 MG PO TABS
6.2500 mg | ORAL_TABLET | Freq: Two times a day (BID) | ORAL | Status: DC
  Administered 2022-08-17 (×2): 6.25 mg via ORAL
  Filled 2022-08-16 (×3): qty 1

## 2022-08-16 NOTE — Care Management Obs Status (Signed)
[place] NOTIFICATION   Patient Details  Name: [name] MRN: [id number] Date of Birth: [birth year]   Medicare Observation Status Notification Given:  Yes    Verdell Carmine, RN 08/16/2022, 5:00 PM

## 2022-08-16 NOTE — ED Provider Notes (Cosign Needed Addendum)
[facility] Provider Note   CSN: [id number] Arrival date & time: 08/16/22  1116     History  Chief Complaint  Patient presents with   Altered Mental Status   *Daughter was present to provide history  [name] is a 77 y.o. male, history of CVA, PE on Eliquis, CHF, CKD 3B, diabetes, COPD, a fib, subdural hematoma, presented with altered mental status that began yesterday.  Daughter stated yesterday patient was not seeming right and today she was concerned when he appeared to be walking however looking like he needed to focus heavily to walk.  Daughter is also noticed patient has had a nonproductive cough which is baseline for his COPD that he takes an inhaler for.  Daughter denied any trauma to the head but did state he fell about a week ago on his butt.  Daughter denied any sick contacts at home or recent fevers, nausea/vomiting/diarrhea.  Home Medications Prior to Admission medications   Medication Sig Start Date End Date Taking? Authorizing Provider  apixaban (ELIQUIS) 5 MG TABS tablet Take 1 tablet (5 mg total) by mouth 2 (two) times daily. 07/14/22   Regalado, Belkys A, MD  Blood Glucose Monitoring Suppl (ACCU-CHEK GUIDE ME) w/Device KIT Use as directed twice per day E11.9 04/02/22   Biagio Borg, MD  carvedilol (COREG) 6.25 MG tablet Take 1 tablet (6.25 mg total) by mouth 2 (two) times daily with a meal. 07/08/22   Regalado, Belkys A, MD  furosemide (LASIX) 40 MG tablet TAKE 1 TABLET BY MOUTH DAILY .  MAY TAKE AN ADDITIONAL 1 TABLET  BY MOUTH IF NEEDED FOR SWELLING 03/10/22   Biagio Borg, MD  glucose blood (ACCU-CHEK GUIDE) test strip Use as instructed twice per day E11.9 04/02/22   Biagio Borg, MD  Lancet Devices [facility] PLUS LANCING) [place]  09/19/20   [provider]  Lancets (ONETOUCH DELICA PLUS [id number]) [place] 10/29/20   Biagio Borg, MD  potassium chloride (KLOR-CON) 10 MEQ tablet Take 2  tablets (20 mEq total) by mouth daily. 07/08/22   Regalado, Belkys A, MD  PROAIR HFA 108 (90 Base) MCG/ACT inhaler INHALE 2 PUFFS INTO THE  LUNGS EVERY 6 HOURS AS  NEEDED Patient taking differently: Inhale 2 puffs into the lungs every 6 (six) hours as needed for shortness of breath or wheezing. 06/01/20   Biagio Borg, MD  rosuvastatin (CRESTOR) 20 MG tablet Take 20 mg by mouth every evening.    [provider]  tamsulosin (FLOMAX) 0.4 MG CAPS capsule Take 1 capsule (0.4 mg total) by mouth daily after supper. 07/08/22   Regalado, Jerald Kief A, MD      Allergies    Ciprofloxacin    Review of Systems   Review of Systems Unable to obtain a review of systems as patient is altered Physical Exam Updated Vital Signs BP (!) 142/93 (BP Location: Right Arm)   Pulse 79   Temp 100.1 F (37.8 C) (Oral)   Resp 20   SpO2 95%  Physical Exam Vitals and nursing note reviewed.  Constitutional:      General: He is not in acute distress.    Appearance: He is well-developed.     Comments: Subgrade fever of 100.1 F  HENT:     Head: Normocephalic and atraumatic.  Eyes:     Conjunctiva/sclera: Conjunctivae normal.  Cardiovascular:     Rate and Rhythm: Normal rate and regular rhythm.  Pulses: Normal pulses.     Heart sounds: Normal heart sounds.  Pulmonary:     Effort: No respiratory distress.     Comments: Patient appeared tachypneic and had nonproductive cough throughout the encounter Abdominal:     Palpations: Abdomen is soft.     Tenderness: There is no abdominal tenderness.  Musculoskeletal:     Cervical back: Neck supple.     Comments: Bilateral 1+ pitting edema that is being managed by compression socks  Skin:    General: Skin is warm and dry.     Capillary Refill: Capillary refill takes less than 2 seconds.  Neurological:     Mental Status: He is alert. He is disoriented.     Sensory: Sensation is intact.     Motor: Motor function is intact.     Coordination: Coordination  is intact.     Comments: Patient is AO x 2: Place and name but does not know the year Patient was unable to follow commands to assess extraocular movements Eyes bilateral pleural Cranial nerve V, VII, VIII, IX, X, XI, XII are intact Gait was not assessed at this time as patient was altered  Psychiatric:        Mood and Affect: Mood normal.     ED Results / Procedures / Treatments   Labs (all labs ordered are listed, but only abnormal results are displayed) Labs Reviewed  RESP PANEL BY RT-PCR (RSV, FLU A&B, COVID)  RVPGX2 - Abnormal; Notable for the following components:      Result Value   Influenza A by PCR POSITIVE (*)    All other components within normal limits  CBC - Abnormal; Notable for the following components:   Hemoglobin 12.4 (*)    RDW 16.9 (*)    All other components within normal limits  PROTIME-INR - Abnormal; Notable for the following components:   Prothrombin Time 17.5 (*)    INR 1.5 (*)    All other components within normal limits  LACTIC ACID, PLASMA - Abnormal; Notable for the following components:   Lactic Acid, Venous 3.3 (*)    All other components within normal limits  COMPREHENSIVE METABOLIC PANEL - Abnormal; Notable for the following components:   Sodium 132 (*)    Potassium 5.3 (*)    CO2 20 (*)    Calcium 7.7 (*)    Albumin 2.4 (*)    All other components within normal limits  CBG MONITORING, ED - Abnormal; Notable for the following components:   Glucose-Capillary 108 (*)    All other components within normal limits  URINALYSIS, ROUTINE W REFLEX MICROSCOPIC  LACTIC ACID, PLASMA  CBC WITH DIFFERENTIAL/PLATELET  CBG MONITORING, ED    EKG EKG Interpretation  Date/Time:  Saturday August 16 2022 11:32:34 EST Ventricular Rate:  69 PR Interval:    QRS Duration: 94 QT Interval:  457 QTC Calculation: 490 R Axis:   -65 Text Interpretation: Atrial fibrillation LVH with secondary repolarization abnormality Inferior infarct, old Anterior  infarct, old No acute changes No significant change since last tracing Confirmed by Varney Biles [phone]) on 08/16/2022 12:20:08 PM  Radiology DG Chest 2 View  Result Date: 08/16/2022 CLINICAL DATA:  Altered mental status EXAM: CHEST - 2 VIEW COMPARISON:  Radiograph July 04, 2022 FINDINGS: Enlarged cardiac silhouette. Aortic atherosclerosis. Similar appearance of the 2 focal consolidative opacities in the right mid lung previously characterized as loculated fluid on chest CT July 04, 2022. No new focal airspace consolidation unchanged elevation of the left  hemidiaphragm. No pneumothorax. The visualized skeletal structures are unchanged. IMPRESSION: Similar appearance of the 2 focal consolidative opacities in the right mid lung previously characterized as loculated fluid on chest CT July 04, 2022. No new focal airspace consolidation. Electronically Signed   By: Dahlia Bailiff M.D.   On: 08/16/2022 13:19   CT Head Wo Contrast  Result Date: 08/16/2022 CLINICAL DATA:  Altered mental status.  Previous strokes. EXAM: CT HEAD WITHOUT CONTRAST TECHNIQUE: Contiguous axial images were obtained from the base of the skull through the vertex without intravenous contrast. RADIATION DOSE REDUCTION: This exam was performed according to the departmental dose-optimization program which includes automated exposure control, adjustment of the mA and/or kV according to patient size and/or use of iterative reconstruction technique. COMPARISON:  07/05/2022 FINDINGS: Brain: No evidence of intracranial hemorrhage, acute infarction, hydrocephalus, extra-axial collection, or mass lesion/mass effect. Old left parietooccipital infarct again seen. Severe chronic small vessel disease unchanged. Moderate cerebral atrophy and ventriculomegaly are also stable. Vascular:  No hyperdense vessel or other acute findings. Skull: No evidence of fracture or other significant bone abnormality. Sinuses/Orbits:  No acute findings. Other:  None. IMPRESSION: No acute intracranial abnormality. Old left parietooccipital infarct, severe chronic small vessel disease, and moderate cerebral atrophy. Electronically Signed   By: Marlaine Hind M.D.   On: 08/16/2022 13:11    Procedures .Critical Care  Performed by: Chuck Hint, PA-C Authorized by: Chuck Hint, PA-C   Critical care provider statement:    Critical care time (minutes):  40   Critical care time was exclusive of:  Separately billable procedures and treating other patients   Critical care was necessary to treat or prevent imminent or life-threatening deterioration of the following conditions: altered mental state with elevated lactate.   Critical care was time spent personally by me on the following activities:  Development of treatment plan with patient or surrogate, discussions with consultants, evaluation of patient's response to treatment, examination of patient, obtaining history from patient or surrogate, review of old charts, re-evaluation of patient's condition, pulse oximetry, ordering and review of radiographic studies, ordering and review of laboratory studies and ordering and performing treatments and interventions   I assumed direction of critical care for this patient from another provider in my specialty: yes     Care discussed with: admitting provider       Medications Ordered in ED Medications  albuterol (VENTOLIN HFA) 108 (90 Base) MCG/ACT inhaler 2 puff (2 puffs Inhalation Given 08/16/22 1425)  sodium chloride 0.9 % bolus 1,000 mL (1,000 mLs Intravenous New Bag/Given 08/16/22 1454)    ED Course/ Medical Decision Making/ A&P                             Medical Decision Making Amount and/or Complexity of Data Reviewed Labs: ordered. Radiology: ordered.  Risk Prescription drug management. Decision regarding hospitalization.   Ernesto Hoecker 77 y.o. presented today for altered mental status. Working DDx that I considered at this time  includes, but not limited to, CVA/TIA, sepsis, pneumonia, viral illness, intracranial hemorrhage, subdural hematoma.  Review of prior external notes: 07/06/22 Progress Notes  Unique Tests and My Interpretation:  Lactic acid: Elevated 3.3 Respiratory panel: Influenza A positive UA: Pending CMP: Hyperkalemic 5.3 and decreased albumin 2.4 CBC with differential: PT/INR: Slightly increased from base CBG: Unremarkable CT head without contrast: No acute changes Chest x-ray: 2 opacities from previous CT chest were present however no acute changes  Discussion with Independent Historian: Daughter  Discussion of Management of Tests: Thomasenia Bottoms, MD Hospitalist  Risk: High:  - hospitalization or escalation of hospital-level care  Risk Stratification Score: None  Staffed with Kathrynn Humble, MD  R/o DDx: PNA: Chest x-ray did not show any new consolidations CVA/TIA: No focal neurodeficits noted on exam Sepsis: Does not meet sepsis criteria at this time Intracranial hemorrhage/subdural hematoma: CT negative  Plan: Patient presented altered.  During encounter patient appeared to be puffing trying to catch his breath.  Patient's daughter also requested patient be given Ventolin HFA as she did not bring his inhaler and patient appeared to be trying to catch his breath and coughing.  Patient vitals right now are hypertension to 148/87 and heart rate of 56.  Patient's lactate came back elevated at 3.3 and so a liter of normal saline was ordered. Given patient's overall presentation and elevated lactate patient will most likely benefit from inpatient medicine.  I suspect the source of the elevated lactate is due to influenza A exacerbating his COPD causing lactate to go up and thereby causing the altered mental state.  Patient's vitals have improved to 142/93 and heart rate of 79 without signs of any distress.  Patient's temp is also 100.1 F orally which I suspect is due to the underlying influenza A.  Patient  is reasonable for admission at this time and hospitalist accepted.        Final Clinical Impression(s) / ED Diagnoses Final diagnoses:  Encephalopathy  Influenza A  Elevated serum lactate dehydrogenase    Rx / DC Orders ED Discharge Orders     None         Elvina Sidle 08/16/22 1524    Chuck Hint, PA-C 08/16/22 Doristine Bosworth, MD 08/17/22 1443

## 2022-08-16 NOTE — Progress Notes (Addendum)
TRH night cross cover note:   I was notified by RN that this patient, who was admitted earlier today for acute metabolic encephalopathy in the setting of influenza A infection, had an 18 related nonsustained V. tach on telemetry.  This was after documented 11 run of nonsustained V. tach earlier in the ED. now back to sinus rhythm, with stable blood pressures, and otherwise no interval change in patient's vital signs.  The patient reportedly conveyed to his family that the last run of V. tach was associated with some chest discomfort, but upon RNs questioning of the patient, he denies any associated or recent chest pain.   In light of the above, prn inquired about the patient's current level of care, admitted for observation to med telemetry.  I subsequently updated admission order to reflect observation to PCU.  Most recent potassium level noted to be 5.3 earlier today.  He has an existing order to check serum magnesium level this evening.  I will follow-up for this result and supplement on a as needed basis to maintain serum magnesium level of greater than or equal to 2.0.  In light of the additional run of nonsustained V. tach with questionable chest discomfort, the validity of which is unclear and complicated by the patient's confusion, I will also order an updated stat EKG.   Update: serum mag level has returned low (1.3). I have subsequently ordered magnesium sulfate 2 g IV over 2 hours x 1 dose now, and noted repeat serum magnesium level ordered for the morning.   Newton Pigg, DO Hospitalist

## 2022-08-16 NOTE — Progress Notes (Signed)
TRH night cross cover note:   I was notified by RN that this patient is currently strict n.p.o. after being unable to pass his bedside swallow screen in the setting of his altered mental status.  There is an existing order for formal SLP consult morning for swallow evaluation.   Additionally, the patient was recently diagnosed with acute bilateral pulmonary emboli in December 2023, for which she is on Eliquis, but has been unable to take either of today's doses as a consequence of his strict n.p.o. status.  Consequently, I have consulted pharmacy for initiation of heparin drip while the patient remains strict n.p.o. and unable to take his Eliquis.  Additionally, RN conveys concern over the patient's low blood sugars.  I subsequently discontinued his existing orders for sliding scale insulin and change the frequency of his CBG monitoring from before every meal/at bedtime to every 4 hours.  Additionally, changes existing IV fluids from LR at 50 cc/h to D5 LR at 50 cc/h and added prn amp of D50 for CBG less than 70.    Babs Bertin, DO Hospitalist

## 2022-08-16 NOTE — TOC Initial Note (Addendum)
Transition of Care [facility]) - Initial/Assessment Note    Patient Details  Name: [name] MRN: [id number] Date of Birth: [birth year]  [facility]) CM/SW Contact:    Verdell Carmine, RN Phone Number: 08/16/2022, 5:02 PM  Clinical Narrative:                 Patient presented with AMS. Spoke with daughter via phone regarding home and needs. Introduced self and role.  [name] was living with daughter [name] with [place] and [place] from [place]. Social work has been involved and actually is in the later stages of securing SNF convalescent stay, 21 days.  Will consult CSW for placement, place a PT consult in . OBS status explained and Paperwork with patients papers.  So far she has looked at and liked [facility] so these are preferred. I explained that we usually send out everywhere and see who accepts then she can make choices. Messaged CSW and provider regarding conversation. TOC will follow for probable placement   Expected Discharge Plan: [place] Barriers to Discharge: Continued Medical Work up   Patient Goals and CMS Choice            Expected Discharge Plan and Services     Post Acute Care Choice: [place] Living arrangements for the past 2 months: [place]:  (Is active with [facility])          Prior Living Arrangements/Services Living arrangements for the past 2 months: [place] Lives with:: Adult Children Patient language and need for interpreter reviewed:: Yes        Need for Family Participation in Patient Care: Yes (Comment) Care giver support system in place?: Yes (comment)   Criminal Activity/Legal Involvement Pertinent to Current Situation/Hospitalization: No - Comment as needed  Activities of Daily Living      Permission Sought/Granted                  Emotional Assessment       Orientation: : Fluctuating  Orientation (Suspected and/or reported [place]) Alcohol / Substance Use: Not Applicable Psych Involvement: No (comment)  Admission diagnosis:  Influenza A [J10.1] Patient Active Problem List   Diagnosis Date Noted   Influenza A 08/16/2022   History of pulmonary embolism 78/29/5621   Acute metabolic encephalopathy 30/86/5784   MCI (mild cognitive impairment) 08/16/2022   Hypokalemia 08/07/2022   Physical deconditioning 08/07/2022   Bilateral pulmonary embolism ([place]) 07/05/2022   UTI (urinary tract infection) 07/04/2022   Urinary tract infection without hematuria 06/28/2022   Confusion 06/28/2022   Hypercoagulable state due to longstanding persistent atrial fibrillation ([place]) 06/08/2022   Foul smelling urine 04/30/2022   Nausea and vomiting 04/30/2022   SDH (subdural hematoma) ([place]) 04/17/2022   Subdural hematoma ([place]) 04/04/2022   New daily persistent headache 04/02/2022   COVID-19 01/26/2022   Hypomagnesemia 01/25/2022   CVA (cerebral vascular accident) ([place]) 01/24/2022   Cough 01/24/2022   Elevated troponin 01/24/2022   Fever 01/24/2022   Bradycardia 11/21/2021   Vitamin D deficiency 11/21/2021   Other fatigue 11/18/2021   Bilateral leg edema 07/08/2021   Gait disorder 12/16/2020   HTN (hypertension) 12/16/2020   Aortic atherosclerosis ([place]) 12/12/2020   Diverticulitis 10/23/2020   Fall 10/22/2020   Sepsis ([place]) 10/22/2020   Acute lower  UTI 10/22/2020   NSVT (nonsustained ventricular tachycardia) (HCC)    Multiple myeloma ([place])    AKI (acute kidney injury) ([place]) 02/15/2020   General weakness    Diabetic leg ulcer ([place]) 10/06/2019   Chronic kidney disease, stage 3b ([place]) 10/06/2019   Diabetic ulcer of right lower leg ([place]) 02/23/2019   Abnormal LFTs 04/27/2018   Open wound of scrotum 03/01/2018   Acute on chronic diastolic CHF (congestive heart failure) ([place]) 02/25/2018   Postoperative anemia due to acute blood loss 02/25/2018   Occipital stroke ([place]), left  09/23/2017   Diabetes mellitus type II, non insulin dependent ([place])    Encounter for well adult exam with abnormal findings 07/31/2017   Venous insufficiency (chronic) (peripheral) with chronic lower extremity edema 04/04/2016   Junctional tachycardia 03/18/2015   Exertional dyspnea 07/05/2014   Obesity (BMI 30-39.9) 05/04/2014   Benign essential tremor 05/04/2014   Peripheral neuropathy 12/21/2013   Nonspecific abnormal finding in stool contents 09/06/2012   Heme positive stool 08/09/2012   Difficulty passing stool 08/09/2012   Sebaceous cyst 04/15/2012   Complex renal cyst 01/13/2011   Back pain 01/02/2011   Permanent atrial fibrillation ([place]) - now off amiodarone; asymptomatic. CHA2DS2-VASc Score -5 (on Xarelto) 01/02/2011   KNEE PAIN, LEFT 08/27/2009   COLONIC POLYPS, HX OF 08/08/2008   ERECTILE DYSFUNCTION 09/27/2007   ULNAR NEUROPATHY 09/27/2007   VENTRICULAR HYPERTROPHY, LEFT 09/27/2007   ALLERGIC RHINITIS 09/27/2007   Arthropathy 09/27/2007   GANGLION CYST, WRIST, LEFT 09/27/2007   HEMORRHOIDS, INTERNAL 06/10/2007   DIVERTICULOSIS, COLON 06/10/2007   DISC DISEASE, LUMBAR 06/03/2007   NEPHROLITHIASIS, HX OF 06/03/2007   Diabetes mellitus with neuropathy ([place]) 02/11/2007   Hyperlipidemia with target LDL less than 100 02/11/2007   ANXIETY 02/11/2007   Nondependent Alcohol Abuse, in Remission 02/11/2007   Hypertensive heart disease with chronic diastolic congestive heart failure ([place]) 02/11/2007   COPD (chronic obstructive pulmonary disease) ([place]) 02/11/2007   BPH (benign prostatic hyperplasia) 02/11/2007   LOW BACK PAIN 02/11/2007   Disturbance of skin sensation 02/11/2007   PROSTATE CANCER, HX OF 02/11/2007   PCP:  Biagio Borg, MD Pharmacy:   [facility] ([place]), [place] - [address] [place] [address] Phone: [phone] Fax: [phone]     Social Determinants of Health ([place]) Social  History: [place]: No Food Insecurity (07/10/2022)  Housing: Low Risk  (07/10/2022)  Transportation Needs: No Transportation Needs (07/10/2022)  Utilities: Not At Risk (07/05/2022)  Depression (PHQ2-9): Low Risk  (08/07/2022)  Tobacco Use: Medium Risk (08/16/2022)   SDOH Interventions:     Readmission Risk Interventions     No data to display

## 2022-08-16 NOTE — H&P (Signed)
History and Physical    PatientTyress [name] GNF:[id number] DOB: 08-May-1946 DOA: 08/16/2022 DOS: the patient was seen and examined on 08/16/2022 PCP: Biagio Borg, MD  Patient coming from: Home - lives with daughter; NOK: Daughter, [name], [phone]   Chief Complaint: AMS  HPI: [name] is a 77 y.o. male with medical history significant of ETOH dependence in  remission; BPH; prostate CA; chronic back pain; DM; COPD; HLD; HTN; transient afib; SDH (03/2022); recent PE on Eliquis; and CVA presenting with AMS.  His daughter reports that he was clearly altered today, was in the bathroom unable to pull on his clothes.  He was clearly altered.  This previously happened with UTI.  Since his head bleed, she has noticed progressive cognitive impairment.  +cough, thought it was related to his COPD.   He had a 10 beat run of vtach while in the ER.   ER Course:  AMS, elevated lactate.  Acting weird since yesterday.  +Influenza.  K+ 5.3.  Negative head CT.  CXR unchanged.     Review of Systems: As mentioned in the history of present illness. All other systems reviewed and are negative. Past Medical History:  Diagnosis Date   Adenoma 05/2008   Alcoholism in recovery [facility])    BPH (benign prostatic hyperplasia)    Chronic LBP    Hip & Back -- Sees Dr. Maia Petties @ [place]   CKD (chronic kidney disease)    with AKI requiring brief CRRT 02/2020   Colon polyps    Controlled type 2 diabetes mellitus with neuropathy ([place]) 01/2007   COPD (chronic obstructive pulmonary disease) ([place])    Diverticulitis of colon    ED (erectile dysfunction)    s/p Penile prosthesis (09/2010)   History of prostate cancer    Dr. Karsten Ro   History of sick sinus syndrome    reduced BB dose for Bradycardia   HLD (hyperlipidemia)    Hypertension    Impaired glucose tolerance 12/21/2013   Nephrolithiasis    Osteoarthritis of both hips    PAF (paroxysmal atrial fibrillation)  (HCC)    No longer on Amiodarone.  Not on Anticoaguation b/c no recurrence.   Peripheral neuropathy 12/21/2013   Stroke ([place])    left occipital CVA ~ 2018; possible TIA 01/24/22   Subdural hematoma ([place]) 04/04/2022   Anticoagulation stopped   Venous insufficiency    Past Surgical History:  Procedure Laterality Date   IR ANGIO INTRA EXTRACRAN SEL COM CAROTID INNOMINATE UNI R MOD SED  04/17/2022   LEFT HEART CATH AND CORONARY ANGIOGRAPHY  2003   Normal Coronary Arteries.   NM MYOVIEW LTD  02/21/2020   EF 60%.  Medium sized mild severity defect in the basal inferior, mid inferior and apical inferior location-suggesting of ischemia.  Read as low-intermediate risk.  (On cardiology review this is felt to be a fixed defect either related to prior infarct versus diaphragmatic attenuation.  Felt to be low risk)   PENILE PROSTHESIS IMPLANT  06/21/2012   Procedure: PENILE PROTHESIS INFLATABLE;  Surgeon: Claybon Jabs, MD;  Location: [facility];  Service: Urology;  Laterality: N/A;  REMOVAL AND REPLACEMENT OF SOME  OF PROSTHESIS (AMS)    PENILE PROSTHESIS PLACEMENT  09/2010   PROSTATECTOMY     RADIOLOGY WITH ANESTHESIA N/A 04/17/2022   Procedure: Right middle meningeal artery embolization;  Surgeon: Consuella Lose, MD;  Location: [place];  Service: Radiology;  Laterality: N/A;   REMOVAL OF PENILE  PROSTHESIS N/A 02/15/2018   Procedure: REMOVAL OF PENILE PROSTHESIS;  Surgeon: Kathie Rhodes, MD;  Location: [facility];  Service: Urology;  Laterality: N/A;   TRANSTHORACIC ECHOCARDIOGRAM  01/2022   EF 70-75%:Marland Kitchen  Hyperdynamic LV with no RWMA.  Mild LVH.  Severely dilated left atrium suggesting notable diastolic dysfunction, but unable to interpret.  Mildly enlarged RV with normal function.  Normal PAP despite moderate dilated RA and increased RAP/CVP.Marland Kitchen  No MS or MR.  No AAS or AI.  Aortic root measuring 42 mm with ascending aorta measuring 41 mm.   TRANSTHORACIC ECHOCARDIOGRAM  02/17/2020   EF 60  to 65%.  No R WMA.  Unable to assess diastolic parameters because of A. fib.  Mildly elevated PA pressures.  Mild LA dilation.  Mild aortic valve sclerosis but no stenosis.  Normal IVC.   Social History:  reports that he quit smoking about 39 years ago. His smoking use included cigarettes. He has never used smokeless tobacco. He reports that he does not currently use alcohol. He reports that he does not use drugs.  Allergies  Allergen Reactions   Ciprofloxacin Other (See Comments)    All over weakness    Family History  Problem Relation Age of Onset   Coronary artery disease Mother    Heart attack Mother    Coronary artery disease Father    Prostate cancer Father    Diabetes Father     Prior to Admission medications   Medication Sig Start Date End Date Taking? Authorizing Provider  apixaban (ELIQUIS) 5 MG TABS tablet Take 1 tablet (5 mg total) by mouth 2 (two) times daily. 07/14/22   Regalado, Belkys A, MD  Blood Glucose Monitoring Suppl (ACCU-CHEK GUIDE ME) w/Device KIT Use as directed twice per day E11.9 04/02/22   Biagio Borg, MD  carvedilol (COREG) 6.25 MG tablet Take 1 tablet (6.25 mg total) by mouth 2 (two) times daily with a meal. 07/08/22   Regalado, Belkys A, MD  furosemide (LASIX) 40 MG tablet TAKE 1 TABLET BY MOUTH DAILY .  MAY TAKE AN ADDITIONAL 1 TABLET  BY MOUTH IF NEEDED FOR SWELLING 03/10/22   Biagio Borg, MD  glucose blood (ACCU-CHEK GUIDE) test strip Use as instructed twice per day E11.9 04/02/22   Biagio Borg, MD  Lancet Devices [facility] PLUS LANCING) [place]  09/19/20   [provider]  Lancets (ONETOUCH DELICA PLUS [id number]) [place] 10/29/20   Biagio Borg, MD  potassium chloride (KLOR-CON) 10 MEQ tablet Take 2 tablets (20 mEq total) by mouth daily. 07/08/22   Regalado, Belkys A, MD  PROAIR HFA 108 (90 Base) MCG/ACT inhaler INHALE 2 PUFFS INTO THE  LUNGS EVERY 6 HOURS AS  NEEDED Patient taking differently: Inhale 2 puffs into the lungs  every 6 (six) hours as needed for shortness of breath or wheezing. 06/01/20   Biagio Borg, MD  rosuvastatin (CRESTOR) 20 MG tablet Take 20 mg by mouth every evening.    [provider]  tamsulosin (FLOMAX) 0.4 MG CAPS capsule Take 1 capsule (0.4 mg total) by mouth daily after supper. 07/08/22   Elmarie Shiley, MD    Physical Exam: Vitals:   08/16/22 1230 08/16/22 1400 08/16/22 1510 08/16/22 1545  BP: 137/76 (!) 148/87 (!) 142/93 (!) 150/74  Pulse:  (!) 56 79   Resp: '15 19 20 '$ (!) 30  Temp:    (!) 102.6 F (39.2 C)  TempSrc:    Oral  SpO2:  95% 95% 98%   General:  Appears calm and comfortable and is in NAD, periodic coarse cough Eyes:  EOMI, normal lids, iris ENT:  grossly normal hearing, lips & tongue, mmm Neck:  no LAD, masses or thyromegaly Cardiovascular:  RRR, no m/r/g. 2+ LE edema.  Respiratory:   CTA bilaterally with no wheezes/rales/rhonchi.  Normal respiratory effort. Abdomen:  soft, NT, ND Skin:  no rash or induration seen on limited exam Musculoskeletal:  grossly normal tone BUE/BLE, good ROM, no bony abnormality Psychiatric:  blunted mood and affect, speech sparse, AOx2 Neurologic:  CN 2-12 grossly intact, moves all extremities in coordinated fashion   Radiological Exams on Admission: Independently reviewed - see discussion in A/P where applicable  DG Chest 2 View  Result Date: 08/16/2022 CLINICAL DATA:  Altered mental status EXAM: CHEST - 2 VIEW COMPARISON:  Radiograph July 04, 2022 FINDINGS: Enlarged cardiac silhouette. Aortic atherosclerosis. Similar appearance of the 2 focal consolidative opacities in the right mid lung previously characterized as loculated fluid on chest CT July 04, 2022. No new focal airspace consolidation unchanged elevation of the left hemidiaphragm. No pneumothorax. The visualized skeletal structures are unchanged. IMPRESSION: Similar appearance of the 2 focal consolidative opacities in the right mid lung previously  characterized as loculated fluid on chest CT July 04, 2022. No new focal airspace consolidation. Electronically Signed   By: Dahlia Bailiff M.D.   On: 08/16/2022 13:19   CT Head Wo Contrast  Result Date: 08/16/2022 CLINICAL DATA:  Altered mental status.  Previous strokes. EXAM: CT HEAD WITHOUT CONTRAST TECHNIQUE: Contiguous axial images were obtained from the base of the skull through the vertex without intravenous contrast. RADIATION DOSE REDUCTION: This exam was performed according to the departmental dose-optimization program which includes automated exposure control, adjustment of the mA and/or kV according to patient size and/or use of iterative reconstruction technique. COMPARISON:  07/05/2022 FINDINGS: Brain: No evidence of intracranial hemorrhage, acute infarction, hydrocephalus, extra-axial collection, or mass lesion/mass effect. Old left parietooccipital infarct again seen. Severe chronic small vessel disease unchanged. Moderate cerebral atrophy and ventriculomegaly are also stable. Vascular:  No hyperdense vessel or other acute findings. Skull: No evidence of fracture or other significant bone abnormality. Sinuses/Orbits:  No acute findings. Other: None. IMPRESSION: No acute intracranial abnormality. Old left parietooccipital infarct, severe chronic small vessel disease, and moderate cerebral atrophy. Electronically Signed   By: Marlaine Hind M.D.   On: 08/16/2022 13:11    EKG: Independently reviewed.  Afib with rate 69; PVCs; nonspecific ST changes with no evidence of acute ischemia   Labs on Admission: I have personally reviewed the available labs and imaging studies at the time of the admission.  Pertinent labs:    Na++ 132 K+ 5.3 CO2 20 Albumin 2.4 Lactate 3.3 WBC 6.6 Hgb 12.4 INR 1.5 Influenza A positive   Assessment and Plan: Principal Problem:   Influenza A Active Problems:   Hyperlipidemia with target LDL less than 100   COPD (chronic obstructive pulmonary disease)  (HCC)   BPH (benign prostatic hyperplasia)   Diabetes mellitus type II, non insulin dependent (HCC)   NSVT (nonsustained ventricular tachycardia) (HCC)   HTN (hypertension)   History of pulmonary embolism   Acute metabolic encephalopathy   MCI (mild cognitive impairment)    Influenza A -Supportive care -Tamiflu BID -IVF at 50 cc/hr x 10 hours -Observation on telemetry -Anticipate resolution of AMS and improvement in symptoms as flu improves  -Lactic acidosis is thought to be related to this  issue, will trend without antibiotic treatment for sepsis at this time  Recent B PE -Hospitalized about 1 month ago (12/15-19) -Continue eliquis  Acute metabolic encephalopathy with underlying apparent cognitive impairment -Patient with SDH after a fall on 9/15 -Daughter has noticed mild progressive cognitive impairment since -This also seems to make him very susceptible to delirium, as this also happened last month with UTI; UA appears reassuring but will check culture -Will add delirium precautions -He does not appear to be alert enough to pass bedside swallow; will order speech therapy swallow evaluation  NSVT -Also happened during last hospitalization -Will monitor on tele -Keep K>4, Mag>2 -Carvedilol dose was increased during last admission for this reason, will continue  DM -Last A1c was 7.0, reasonable control -Not on home meds -Cover with moderate-scale SSI   COPD -Unable to use HFA currently so will change to nebs  HTN -Continue carvedilol  HLD -Continue rusuvastatin  BPH -Continue tamsulosin  Chronic diastolic CHF with lymphedema -Lymphedema is better than usual, per daughter -Hold Lasix for now  Afib -Rate controlled with Coreg -Continue Eliquis    Advance Care Planning:   Code Status: Full Code - Code status was discussed with the patient and/or family at the time of admission.  The patient would want to receive full resuscitative measures at this time.    Consults: TOC team; nutrition; PT/OT; SLP  DVT Prophylaxis: Eliquis  Family Communication: Daughter was present throughout evaluation  Severity of Illness: The appropriate patient status for this patient is OBSERVATION. Observation status is judged to be reasonable and necessary in order to provide the required intensity of service to ensure the patient's safety. The patient's presenting symptoms, physical exam findings, and initial radiographic and laboratory data in the context of their medical condition is felt to place them at decreased risk for further clinical deterioration. Furthermore, it is anticipated that the patient will be medically stable for discharge from the hospital within 2 midnights of admission.   Author: Karmen Bongo, MD 08/16/2022 4:54 PM  For on call review [email or web address].

## 2022-08-16 NOTE — ED Notes (Signed)
Potassium 7.0. PA made aware via secure chat.

## 2022-08-16 NOTE — ED Notes (Signed)
ED TO INPATIENT HANDOFF REPORT  ED Nurse Name and Phone #: Varney Biles. Owens Shark [phone]  S Name/Age/Gender Rylie Lascano 77 y.o. male Room/Bed: 037C/037C  Code Status   Code Status: Full Code  Home/SNF/Other Home Patient oriented to: self Is this baseline? No   Triage Complete: Triage complete  Chief Complaint Influenza A [J10.1]  Triage Note Pt BIB [facility] from home d/t AMS. Pt does have neuro deficits from previous CVA. Pt was found in the bathroom undressed in the bathroom which is very odd per daughter. Pt does his ADLs independently, but not today. VSS.   HR 60  AFIB baseline  BP 160/90   Allergies Allergies  Allergen Reactions   Ciprofloxacin Other (See Comments)    All over weakness    Level of Care/Admitting Diagnosis ED Disposition     ED Disposition  Admit   Condition  --   [place]: [place] [100100]  Level of Care: Telemetry Medical [104]  May place patient in observation at [facility] or [facility] if equivalent level of care is available:: Yes  Covid Evaluation: Confirmed COVID Negative  Diagnosis: Influenza A [614431]  Admitting Physician: Karmen Bongo [[id number]]  Attending Physician: Karmen Bongo [[id number]]          B Medical/Surgery History Past Medical History:  Diagnosis Date   Adenoma 05/2008   Alcoholism in recovery ([place])    BPH (benign prostatic hyperplasia)    Chronic LBP    Hip & Back -- Sees Dr. Maia Petties @ [facility]   CKD (chronic kidney disease)    with AKI requiring brief CRRT 02/2020   Colon polyps    Controlled type 2 diabetes mellitus with neuropathy ([place]) 01/2007   COPD (chronic obstructive pulmonary disease) ([place])    Diverticulitis of colon    ED (erectile dysfunction)    s/p Penile prosthesis (09/2010)   History of prostate cancer    Dr. Karsten Ro   History of sick sinus syndrome    reduced BB dose for Bradycardia   HLD (hyperlipidemia)    Hypertension    Impaired  glucose tolerance 12/21/2013   Nephrolithiasis    Osteoarthritis of both hips    PAF (paroxysmal atrial fibrillation) (HCC)    No longer on Amiodarone.  Not on Anticoaguation b/c no recurrence.   Peripheral neuropathy 12/21/2013   Stroke ([place])    left occipital CVA ~ 2018; possible TIA 01/24/22   Subdural hematoma ([place]) 04/04/2022   Anticoagulation stopped   Venous insufficiency    Past Surgical History:  Procedure Laterality Date   IR ANGIO INTRA EXTRACRAN SEL COM CAROTID INNOMINATE UNI R MOD SED  04/17/2022   LEFT HEART CATH AND CORONARY ANGIOGRAPHY  2003   Normal Coronary Arteries.   NM MYOVIEW LTD  02/21/2020   EF 60%.  Medium sized mild severity defect in the basal inferior, mid inferior and apical inferior location-suggesting of ischemia.  Read as low-intermediate risk.  (On cardiology review this is felt to be a fixed defect either related to prior infarct versus diaphragmatic attenuation.  Felt to be low risk)   PENILE PROSTHESIS IMPLANT  06/21/2012   Procedure: PENILE PROTHESIS INFLATABLE;  Surgeon: Claybon Jabs, MD;  Location: [facility];  Service: Urology;  Laterality: N/A;  REMOVAL AND REPLACEMENT OF SOME  OF PROSTHESIS ([facility])    PENILE PROSTHESIS PLACEMENT  09/2010   PROSTATECTOMY     RADIOLOGY WITH ANESTHESIA N/A 04/17/2022   Procedure: Right  middle meningeal artery embolization;  Surgeon: Consuella Lose, MD;  Location: [facility];  Service: Radiology;  Laterality: N/A;   REMOVAL OF PENILE PROSTHESIS N/A 02/15/2018   Procedure: REMOVAL OF PENILE PROSTHESIS;  Surgeon: Kathie Rhodes, MD;  Location: [facility];  Service: Urology;  Laterality: N/A;   TRANSTHORACIC ECHOCARDIOGRAM  01/2022   EF 70-75%:Marland Kitchen  Hyperdynamic LV with no RWMA.  Mild LVH.  Severely dilated left atrium suggesting notable diastolic dysfunction, but unable to interpret.  Mildly enlarged RV with normal function.  Normal PAP despite moderate dilated RA and increased RAP/CVP.Marland Kitchen  No MS or MR.  No AAS or  AI.  Aortic root measuring 42 mm with ascending aorta measuring 41 mm.   TRANSTHORACIC ECHOCARDIOGRAM  02/17/2020   EF 60 to 65%.  No R WMA.  Unable to assess diastolic parameters because of A. fib.  Mildly elevated PA pressures.  Mild LA dilation.  Mild aortic valve sclerosis but no stenosis.  Normal IVC.     A IV Location/Drains/Wounds Patient Lines/Drains/Airways Status     Active Line/Drains/Airways     Name Placement date Placement time Site Days   Peripheral IV 08/16/22 22 G Posterior;Left Hand 08/16/22  --  Hand  less than 1   Peripheral IV 08/16/22 20 G Anterior;Distal;Right;Upper Arm 08/16/22  --  Arm  less than 1   Peripheral IV 08/16/22 20 G Left;Posterior Wrist 08/16/22  --  Wrist  less than 1   Open Drain 1 Medial Other (Comment)  02/15/18  0938  Other (Comment)  1643   Open Drain 2 Medial Other (Comment)  02/15/18  0940  Other (Comment)  1643   Open Drain 3 Right Other (Comment)  02/15/18  0953  Other (Comment)  1643   Incision (Closed) 04/17/22 Thigh Anterior;Right;Proximal 04/17/22  2035  -- 121   Pressure Injury 02/16/20 Sacrum Bilateral Stage 2 -  Partial thickness loss of dermis presenting as a shallow open injury with a red, pink wound bed without slough. 02/16/20  0400  -- 912   Pressure Injury 02/16/20 Right Stage 2 -  Partial thickness loss of dermis presenting as a shallow open injury with a red, pink wound bed without slough. 02/16/20  2200  -- 912   Pressure Injury 02/16/20 Buttocks Right;Lateral Stage 2 -  Partial thickness loss of dermis presenting as a shallow open injury with a red, pink wound bed without slough. 02/16/20  2200  -- 912            Intake/Output Last 24 hours  Intake/Output Summary (Last 24 hours) at 08/16/2022 1857 Last data filed at 08/16/2022 1655 Gross per 24 hour  Intake 1000 ml  Output 400 ml  Net 600 ml    Labs/Imaging Results for orders placed or performed during the hospital encounter of 08/16/22 (from the past 48 hour(s))   CBG monitoring, ED     Status: Abnormal   Collection Time: 08/16/22 11:31 AM  Result Value Ref Range   Glucose-Capillary 108 (H) 70 - 99 mg/dL    Comment: Glucose reference range applies only to samples taken after fasting for at least 8 hours.  CBC     Status: Abnormal   Collection Time: 08/16/22 11:47 AM  Result Value Ref Range   WBC 6.6 4.0 - 10.5 K/uL   RBC 4.62 4.22 - 5.81 MIL/uL   Hemoglobin 12.4 (L) 13.0 - 17.0 g/dL   HCT 40.5 39.0 - 52.0 %   MCV 87.7 80.0 - 100.0 fL  MCH 26.8 26.0 - 34.0 pg   MCHC 30.6 30.0 - 36.0 g/dL   RDW 16.9 (H) 11.5 - 15.5 %   Platelets 176 150 - 400 K/uL   nRBC 0.0 0.0 - 0.2 %    Comment: Performed at [facility], [place] [address], [place], [place] [address]  Protime-INR - (order if patient is taking Coumadin / Warfarin)     Status: Abnormal   Collection Time: 08/16/22 11:47 AM  Result Value Ref Range   Prothrombin Time 17.5 (H) 11.4 - 15.2 seconds   INR 1.5 (H) 0.8 - 1.2    Comment: (NOTE) INR goal varies based on device and disease states. Performed at [facility], [place] [address], [place], [place] [address]   CBC with Differential/Platelet     Status: Abnormal   Collection Time: 08/16/22 12:28 PM  Result Value Ref Range   WBC 6.7 4.0 - 10.5 K/uL   RBC 4.66 4.22 - 5.81 MIL/uL   Hemoglobin 12.9 (L) 13.0 - 17.0 g/dL   HCT 40.2 39.0 - 52.0 %   MCV 86.3 80.0 - 100.0 fL   MCH 27.7 26.0 - 34.0 pg   MCHC 32.1 30.0 - 36.0 g/dL   RDW 16.8 (H) 11.5 - 15.5 %   Platelets 188 150 - 400 K/uL   nRBC 0.0 0.0 - 0.2 %   Neutrophils Relative % 79 %   Neutro Abs 5.2 1.7 - 7.7 K/uL   Lymphocytes Relative 13 %   Lymphs Abs 0.9 0.7 - 4.0 K/uL   Monocytes Relative 8 %   Monocytes Absolute 0.6 0.1 - 1.0 K/uL   Eosinophils Relative 0 %   Eosinophils Absolute 0.0 0.0 - 0.5 K/uL   Basophils Relative 0 %   Basophils Absolute 0.0 0.0 - 0.1 K/uL   Immature Granulocytes 0 %   Abs Immature Granulocytes 0.03 0.00 - 0.07 K/uL    Comment: Performed at  [facility] [address] [place], [place] [address]  Lactic acid, plasma     Status: Abnormal   Collection Time: 08/16/22  1:44 PM  Result Value Ref Range   Lactic Acid, Venous 3.3 (HH) 0.5 - 1.9 mmol/L    Comment: CRITICAL RESULT CALLED TO, READ BACK BY AND VERIFIED WITH C.YANG RN 08/16/22 '@1429'$  BY J.WHITE Performed at [facility] [address], [place], [place] [address]   Comprehensive metabolic panel     Status: Abnormal   Collection Time: 08/16/22  1:44 PM  Result Value Ref Range   Sodium 132 (L) 135 - 145 mmol/L   Potassium 5.3 (H) 3.5 - 5.1 mmol/L   Chloride 103 98 - 111 mmol/L   CO2 20 (L) 22 - 32 mmol/L   Glucose, Bld 95 70 - 99 mg/dL    Comment: Glucose reference range applies only to samples taken after fasting for at least 8 hours.   BUN 12 8 - 23 mg/dL   Creatinine, Ser 1.24 0.61 - 1.24 mg/dL   Calcium 7.7 (L) 8.9 - 10.3 mg/dL   Total Protein 6.8 6.5 - 8.1 g/dL   Albumin 2.4 (L) 3.5 - 5.0 g/dL   AST 32 15 - 41 U/L   ALT 12 0 - 44 U/L   Alkaline Phosphatase 122 38 - 126 U/L   Total Bilirubin 0.8 0.3 - 1.2 mg/dL   GFR, Estimated >60 >60 mL/min    Comment: (NOTE) Calculated using the CKD-EPI Creatinine Equation (2021)    Anion  gap 9 5 - 15    Comment: Performed at [facility], [place] [address], [place], [place] [address]  Resp panel by RT-PCR (RSV, Flu A&B, Covid) Anterior Nasal Swab     Status: Abnormal   Collection Time: 08/16/22  2:15 PM   Specimen: Anterior Nasal Swab  Result Value Ref Range   SARS Coronavirus 2 by RT PCR NEGATIVE NEGATIVE    Comment: (NOTE) SARS-CoV-2 target nucleic acids are NOT DETECTED.  The SARS-CoV-2 RNA is generally detectable in upper respiratory specimens during the acute phase of infection. The lowest concentration of SARS-CoV-2 viral copies this assay can detect is 138 copies/mL. A negative result does not preclude SARS-Cov-2 infection and should not be used as the sole basis for treatment or other patient  management decisions. A negative result may occur with  improper specimen collection/handling, submission of specimen other than nasopharyngeal swab, presence of viral mutation(s) within the areas targeted by this assay, and inadequate number of viral copies(<138 copies/mL). A negative result must be combined with clinical observations, patient history, and epidemiological information. The expected result is Negative.  Fact Sheet for Patients:  EntrepreneurPulse.com.au  Fact Sheet for Healthcare Providers:  IncredibleEmployment.be  This test is no t yet approved or cleared by the [place] FDA and  has been authorized for detection and/or diagnosis of SARS-CoV-2 by FDA under an Emergency Use Authorization (EUA). This EUA will remain  in effect (meaning this test can be used) for the duration of the COVID-19 declaration under Section 564(b)(1) of the Act, 21 U.S.C.section 360bbb-3(b)(1), unless the authorization is terminated  or revoked sooner.       Influenza A by PCR POSITIVE (A) NEGATIVE   Influenza B by PCR NEGATIVE NEGATIVE    Comment: (NOTE) The Xpert Xpress SARS-CoV-2/FLU/RSV plus assay is intended as an aid in the diagnosis of influenza from Nasopharyngeal swab specimens and should not be used as a sole basis for treatment. Nasal washings and aspirates are unacceptable for Xpert Xpress SARS-CoV-2/FLU/RSV testing.  Fact Sheet for Patients: EntrepreneurPulse.com.au  Fact Sheet for Healthcare Providers: IncredibleEmployment.be  This test is not yet approved or cleared by the [place] FDA and has been authorized for detection and/or diagnosis of SARS-CoV-2 by FDA under an Emergency Use Authorization (EUA). This EUA will remain in effect (meaning this test can be used) for the duration of the COVID-19 declaration under Section 564(b)(1) of the Act, 21 U.S.C. section 360bbb-3(b)(1), unless the  authorization is terminated or revoked.     Resp Syncytial Virus by PCR NEGATIVE NEGATIVE    Comment: (NOTE) Fact Sheet for Patients: EntrepreneurPulse.com.au  Fact Sheet for Healthcare Providers: IncredibleEmployment.be  This test is not yet approved or cleared by the [place] FDA and has been authorized for detection and/or diagnosis of SARS-CoV-2 by FDA under an Emergency Use Authorization (EUA). This EUA will remain in effect (meaning this test can be used) for the duration of the COVID-19 declaration under Section 564(b)(1) of the Act, 21 U.S.C. section 360bbb-3(b)(1), unless the authorization is terminated or revoked.  Performed at [facility], [place] [address] [place], [place] [address]   Urinalysis, Routine w reflex microscopic -Urine, Catheterized     Status: Abnormal   Collection Time: 08/16/22  3:23 PM  Result Value Ref Range   Color, Urine YELLOW YELLOW   APPearance CLEAR CLEAR   Specific Gravity, Urine 1.012 1.005 - 1.030   pH 6.0 5.0 - 8.0   Glucose, UA NEGATIVE NEGATIVE mg/dL  Hgb urine dipstick SMALL (A) NEGATIVE   Bilirubin Urine NEGATIVE NEGATIVE   Ketones, ur NEGATIVE NEGATIVE mg/dL   Protein, ur >=300 (A) NEGATIVE mg/dL   Nitrite NEGATIVE NEGATIVE   Leukocytes,Ua NEGATIVE NEGATIVE   RBC / HPF 6-10 0 - 5 RBC/hpf   WBC, UA 0-5 0 - 5 WBC/hpf   Bacteria, UA RARE (A) NONE SEEN   Squamous Epithelial / HPF 0-5 0 - 5 /HPF   Mucus PRESENT    Hyaline Casts, UA PRESENT     Comment: Performed at [facility] [address] [place], [place] [address]  CBG monitoring, ED     Status: Abnormal   Collection Time: 08/16/22  5:25 PM  Result Value Ref Range   Glucose-Capillary 122 (H) 70 - 99 mg/dL    Comment: Glucose reference range applies only to samples taken after fasting for at least 8 hours.  Lactic acid, plasma     Status: Abnormal   Collection Time: 08/16/22  5:26 PM  Result Value Ref Range   Lactic  Acid, Venous 2.5 (HH) 0.5 - 1.9 mmol/L    Comment: CRITICAL VALUE NOTED. VALUE IS CONSISTENT WITH PREVIOUSLY REPORTED/CALLED VALUE Performed at [facility], [place] [address], [place], [place] [address]    DG Chest 2 View  Result Date: 08/16/2022 CLINICAL DATA:  Altered mental status EXAM: CHEST - 2 VIEW COMPARISON:  Radiograph July 04, 2022 FINDINGS: Enlarged cardiac silhouette. Aortic atherosclerosis. Similar appearance of the 2 focal consolidative opacities in the right mid lung previously characterized as loculated fluid on chest CT July 04, 2022. No new focal airspace consolidation unchanged elevation of the left hemidiaphragm. No pneumothorax. The visualized skeletal structures are unchanged. IMPRESSION: Similar appearance of the 2 focal consolidative opacities in the right mid lung previously characterized as loculated fluid on chest CT July 04, 2022. No new focal airspace consolidation. Electronically Signed   By: Dahlia Bailiff M.D.   On: 08/16/2022 13:19   CT Head Wo Contrast  Result Date: 08/16/2022 CLINICAL DATA:  Altered mental status.  Previous strokes. EXAM: CT HEAD WITHOUT CONTRAST TECHNIQUE: Contiguous axial images were obtained from the base of the skull through the vertex without intravenous contrast. RADIATION DOSE REDUCTION: This exam was performed according to the departmental dose-optimization program which includes automated exposure control, adjustment of the mA and/or kV according to patient size and/or use of iterative reconstruction technique. COMPARISON:  07/05/2022 FINDINGS: Brain: No evidence of intracranial hemorrhage, acute infarction, hydrocephalus, extra-axial collection, or mass lesion/mass effect. Old left parietooccipital infarct again seen. Severe chronic small vessel disease unchanged. Moderate cerebral atrophy and ventriculomegaly are also stable. Vascular:  No hyperdense vessel or other acute findings. Skull: No evidence of fracture or other  significant bone abnormality. Sinuses/Orbits:  No acute findings. Other: None. IMPRESSION: No acute intracranial abnormality. Old left parietooccipital infarct, severe chronic small vessel disease, and moderate cerebral atrophy. Electronically Signed   By: Marlaine Hind M.D.   On: 08/16/2022 13:11    Pending Labs Unresulted Labs (From admission, onward)     Start     Ordered   08/17/22 5701  Basic metabolic panel  Tomorrow morning,   R        08/16/22 1643   08/17/22 0500  CBC  Tomorrow morning,   R        08/16/22 1643   08/17/22 0500  Magnesium  Tomorrow morning,   R        08/16/22 1646   08/16/22  1652  Urine Culture (for pregnant, neutropenic or urologic patients or patients with an indwelling urinary catheter)  (Urine Labs)  Add-on,   AD       Question:  Indication  Answer:  Altered mental status (if no other cause identified)   08/16/22 1652   08/16/22 1645  Magnesium  Once,   R        08/16/22 1646            Vitals/Pain Today's Vitals   08/16/22 1400 08/16/22 1510 08/16/22 1545 08/16/22 1830  BP: (!) 148/87 (!) 142/93 (!) 150/74 (!) 154/86  Pulse: (!) 56 79    Resp: 19 20 (!) 30 20  Temp:   (!) 102.6 F (39.2 C)   TempSrc:   Oral   SpO2: 95% 95% 98% 100%    Isolation Precautions No active isolations  Medications Medications  carvedilol (COREG) tablet 6.25 mg (0 mg Oral Hold 08/16/22 1752)  rosuvastatin (CRESTOR) tablet 20 mg (0 mg Oral Hold 08/16/22 1804)  tamsulosin (FLOMAX) capsule 0.4 mg (0 mg Oral Hold 08/16/22 1804)  apixaban (ELIQUIS) tablet 5 mg (has no administration in time range)  insulin aspart (novoLOG) injection 0-15 Units (2 Units Subcutaneous Given 08/16/22 1740)  sodium chloride flush (NS) 0.9 % injection 3 mL (has no administration in time range)  lactated ringers infusion ( Intravenous New Bag/Given 08/16/22 1750)  acetaminophen (TYLENOL) tablet 650 mg ( Oral See Alternative 08/16/22 1743)    Or  acetaminophen (TYLENOL) suppository 650 mg (650 mg  Rectal Given 08/16/22 1743)  oxyCODONE (Oxy IR/ROXICODONE) immediate release tablet 5 mg (has no administration in time range)  docusate sodium (COLACE) capsule 100 mg (has no administration in time range)  polyethylene glycol (MIRALAX / GLYCOLAX) packet 17 g (has no administration in time range)  bisacodyl (DULCOLAX) EC tablet 5 mg (has no administration in time range)  ondansetron (ZOFRAN) tablet 4 mg (has no administration in time range)    Or  ondansetron (ZOFRAN) injection 4 mg (has no administration in time range)  hydrALAZINE (APRESOLINE) injection 5 mg (has no administration in time range)  oseltamivir (TAMIFLU) capsule 75 mg (0 mg Oral Hold 08/16/22 1804)  albuterol (PROVENTIL) (2.5 MG/3ML) 0.083% nebulizer solution 2.5 mg (2.5 mg Nebulization Given 08/16/22 1728)  albuterol (VENTOLIN HFA) 108 (90 Base) MCG/ACT inhaler 2 puff (2 puffs Inhalation Given 08/16/22 1425)  sodium chloride 0.9 % bolus 1,000 mL (0 mLs Intravenous Stopped 08/16/22 1655)    Mobility non-ambulatory     Focused Assessments Pulmonary Assessment Handoff:  Lung sounds:   O2 Device: Room Air      R Recommendations: See Admitting Provider Note  Report given to:   Additional Notes: Pt has 10-20 beat runs of V-Tach. Asymptomatic with episodes. Dr. Lorin Mercy aware.

## 2022-08-16 NOTE — ED Notes (Signed)
Dr. Lorin Mercy at bedside assessing the pt. Pt had an short run of VT. Pt was asymptomatic at that time.

## 2022-08-16 NOTE — Progress Notes (Signed)
ANTICOAGULATION CONSULT NOTE - Initial Consult  Pharmacy Consult for Heparin Indication: atrial fibrillation  Allergies  Allergen Reactions   Ciprofloxacin Other (See Comments)    All over weakness    Patient Measurements: Weight: 102 kg (224 lb 13.9 oz) Heparin Dosing Weight: 100 kg  Vital Signs: Temp: 102.6 F (39.2 C) (01/27 2212) Temp Source: Oral (01/27 2212) BP: 147/64 (01/27 2212) Pulse Rate: 85 (01/27 2212)  Labs: Recent Labs    08/16/22 1147 08/16/22 1228 08/16/22 1344  HGB 12.4* 12.9*  --   HCT 40.5 40.2  --   PLT 176 188  --   LABPROT 17.5*  --   --   INR 1.5*  --   --   CREATININE  --   --  1.24    Estimated Creatinine Clearance: 64.6 mL/min (by C-G formula based on SCr of 1.24 mg/dL).   Medical History: Past Medical History:  Diagnosis Date   Adenoma 05/2008   Alcoholism in recovery [facility]    BPH (benign prostatic hyperplasia)    Chronic LBP    Hip & Back -- Sees Dr. Maia Petties @ [place]   CKD (chronic kidney disease)    with AKI requiring brief CRRT 02/2020   Colon polyps    Controlled type 2 diabetes mellitus with neuropathy ([place]) 01/2007   COPD (chronic obstructive pulmonary disease) ([place])    Diverticulitis of colon    ED (erectile dysfunction)    s/p Penile prosthesis (09/2010)   History of prostate cancer    Dr. Karsten Ro   History of sick sinus syndrome    reduced BB dose for Bradycardia   HLD (hyperlipidemia)    Hypertension    Impaired glucose tolerance 12/21/2013   Nephrolithiasis    Osteoarthritis of both hips    PAF (paroxysmal atrial fibrillation) (HCC)    No longer on Amiodarone.  Not on Anticoaguation b/c no recurrence.   Peripheral neuropathy 12/21/2013   Stroke [facility])    left occipital CVA ~ 2018; possible TIA 01/24/22   Subdural hematoma ([place]) 04/04/2022   Anticoagulation stopped   Venous insufficiency     Medications:  No current facility-administered medications on file prior to encounter.   Current  Outpatient Medications on File Prior to Encounter  Medication Sig Dispense Refill   apixaban (ELIQUIS) 5 MG TABS tablet Take 1 tablet (5 mg total) by mouth 2 (two) times daily. 60 tablet 3   carvedilol (COREG) 6.25 MG tablet Take 1 tablet (6.25 mg total) by mouth 2 (two) times daily with a meal. 60 tablet 3   furosemide (LASIX) 40 MG tablet TAKE 1 TABLET BY MOUTH DAILY .  MAY TAKE AN ADDITIONAL 1 TABLET  BY MOUTH IF NEEDED FOR SWELLING (Patient taking differently: Take 40 mg by mouth daily. TAKE 1 TABLET BY MOUTH DAILY .  MAY TAKE AN ADDITIONAL 1 TABLET  BY MOUTH IF NEEDED FOR SWELLING) 200 tablet 1   potassium chloride (KLOR-CON) 10 MEQ tablet Take 2 tablets (20 mEq total) by mouth daily. 100 tablet 3   PROAIR HFA 108 (90 Base) MCG/ACT inhaler INHALE 2 PUFFS INTO THE  LUNGS EVERY 6 HOURS AS  NEEDED (Patient taking differently: Inhale 2 puffs into the lungs every 6 (six) hours as needed for shortness of breath or wheezing.) 34 g 3   rosuvastatin (CRESTOR) 20 MG tablet Take 20 mg by mouth every evening.     tamsulosin (FLOMAX) 0.4 MG CAPS capsule Take 1 capsule (0.4 mg total) by mouth  daily after supper. 30 capsule 1   Blood Glucose Monitoring Suppl (ACCU-CHEK GUIDE ME) w/Device KIT Use as directed twice per day E11.9 1 kit 0   glucose blood (ACCU-CHEK GUIDE) test strip Use as instructed twice per day E11.9 200 each 12   Lancet Devices (ONETOUCH DELICA PLUS LANCING) MISC      Lancets (ONETOUCH DELICA PLUS [id number]) MISC USE ONCE DAILY 100 each 3     Assessment: 77 y.o. male with h/o Afib, Eliquis on hold, for heparin Goal of Therapy:  aPTT 66-102 sec Heparin level 0.3-0.7 units/ml Monitor platelets by anticoagulation protocol: Yes   Plan:  Start heparin 1450 units/hr Check aPTT in 8 hours   Caryl Pina 08/16/2022,11:26 PM

## 2022-08-16 NOTE — ED Triage Notes (Signed)
Pt BIB [facility] from home d/t AMS. Pt does have neuro deficits from previous CVA. Pt was found in the bathroom undressed in the bathroom which is very odd per daughter. Pt does his ADLs independently, but not today. VSS.   HR 60  AFIB baseline  BP 160/90

## 2022-08-16 NOTE — ED Notes (Signed)
Pt had another brief episode of V-tach that lasted approx 18 beats. Family stated that when it happened pt c/o chest pain. After the episode this RN assessed pt and pt now denying CP. Admitting MD paged.

## 2022-08-17 DIAGNOSIS — F1021 Alcohol dependence, in remission: Secondary | ICD-10-CM | POA: Diagnosis present

## 2022-08-17 DIAGNOSIS — I472 Ventricular tachycardia, unspecified: Secondary | ICD-10-CM | POA: Diagnosis present

## 2022-08-17 DIAGNOSIS — I130 Hypertensive heart and chronic kidney disease with heart failure and stage 1 through stage 4 chronic kidney disease, or unspecified chronic kidney disease: Secondary | ICD-10-CM | POA: Diagnosis present

## 2022-08-17 DIAGNOSIS — E785 Hyperlipidemia, unspecified: Secondary | ICD-10-CM | POA: Diagnosis present

## 2022-08-17 DIAGNOSIS — G9340 Encephalopathy, unspecified: Secondary | ICD-10-CM | POA: Diagnosis not present

## 2022-08-17 DIAGNOSIS — G8929 Other chronic pain: Secondary | ICD-10-CM | POA: Diagnosis present

## 2022-08-17 DIAGNOSIS — Z8546 Personal history of malignant neoplasm of prostate: Secondary | ICD-10-CM | POA: Diagnosis not present

## 2022-08-17 DIAGNOSIS — E872 Acidosis, unspecified: Secondary | ICD-10-CM | POA: Diagnosis present

## 2022-08-17 DIAGNOSIS — Z86711 Personal history of pulmonary embolism: Secondary | ICD-10-CM | POA: Diagnosis not present

## 2022-08-17 DIAGNOSIS — Z87891 Personal history of nicotine dependence: Secondary | ICD-10-CM | POA: Diagnosis not present

## 2022-08-17 DIAGNOSIS — E1122 Type 2 diabetes mellitus with diabetic chronic kidney disease: Secondary | ICD-10-CM | POA: Diagnosis present

## 2022-08-17 DIAGNOSIS — G3184 Mild cognitive impairment, so stated: Secondary | ICD-10-CM | POA: Diagnosis present

## 2022-08-17 DIAGNOSIS — J440 Chronic obstructive pulmonary disease with acute lower respiratory infection: Secondary | ICD-10-CM | POA: Diagnosis present

## 2022-08-17 DIAGNOSIS — I482 Chronic atrial fibrillation, unspecified: Secondary | ICD-10-CM | POA: Diagnosis present

## 2022-08-17 DIAGNOSIS — I10 Essential (primary) hypertension: Secondary | ICD-10-CM | POA: Diagnosis not present

## 2022-08-17 DIAGNOSIS — E1140 Type 2 diabetes mellitus with diabetic neuropathy, unspecified: Secondary | ICD-10-CM | POA: Diagnosis present

## 2022-08-17 DIAGNOSIS — J101 Influenza due to other identified influenza virus with other respiratory manifestations: Secondary | ICD-10-CM | POA: Diagnosis present

## 2022-08-17 DIAGNOSIS — E669 Obesity, unspecified: Secondary | ICD-10-CM | POA: Diagnosis present

## 2022-08-17 DIAGNOSIS — E871 Hypo-osmolality and hyponatremia: Secondary | ICD-10-CM | POA: Diagnosis present

## 2022-08-17 DIAGNOSIS — N1832 Chronic kidney disease, stage 3b: Secondary | ICD-10-CM | POA: Diagnosis present

## 2022-08-17 DIAGNOSIS — N400 Enlarged prostate without lower urinary tract symptoms: Secondary | ICD-10-CM | POA: Diagnosis present

## 2022-08-17 DIAGNOSIS — R7402 Elevation of levels of lactic acid dehydrogenase (LDH): Secondary | ICD-10-CM

## 2022-08-17 DIAGNOSIS — Z1152 Encounter for screening for COVID-19: Secondary | ICD-10-CM | POA: Diagnosis not present

## 2022-08-17 DIAGNOSIS — I4729 Other ventricular tachycardia: Secondary | ICD-10-CM | POA: Diagnosis not present

## 2022-08-17 DIAGNOSIS — G9341 Metabolic encephalopathy: Secondary | ICD-10-CM | POA: Diagnosis present

## 2022-08-17 DIAGNOSIS — I5032 Chronic diastolic (congestive) heart failure: Secondary | ICD-10-CM | POA: Diagnosis present

## 2022-08-17 DIAGNOSIS — M549 Dorsalgia, unspecified: Secondary | ICD-10-CM | POA: Diagnosis present

## 2022-08-17 LAB — BASIC METABOLIC PANEL
Anion gap: 6 (ref 5–15)
BUN: 11 mg/dL (ref 8–23)
CO2: 25 mmol/L (ref 22–32)
Calcium: 8.4 mg/dL — ABNORMAL LOW (ref 8.9–10.3)
Chloride: 105 mmol/L (ref 98–111)
Creatinine, Ser: 1.4 mg/dL — ABNORMAL HIGH (ref 0.61–1.24)
GFR, Estimated: 52 mL/min — ABNORMAL LOW (ref >60)
Glucose, Bld: 109 mg/dL — ABNORMAL HIGH (ref 70–99)
Potassium: 3.7 mmol/L (ref 3.5–5.1)
Sodium: 136 mmol/L (ref 135–145)

## 2022-08-17 LAB — GLUCOSE, CAPILLARY
Glucose-Capillary: 108 mg/dL — ABNORMAL HIGH (ref 70–99)
Glucose-Capillary: 117 mg/dL — ABNORMAL HIGH (ref 70–99)
Glucose-Capillary: 129 mg/dL — ABNORMAL HIGH (ref 70–99)
Glucose-Capillary: 136 mg/dL — ABNORMAL HIGH (ref 70–99)
Glucose-Capillary: 85 mg/dL (ref 70–99)

## 2022-08-17 LAB — CBC
HCT: 40 % (ref 39.0–52.0)
Hemoglobin: 12.9 g/dL — ABNORMAL LOW (ref 13.0–17.0)
MCH: 27.2 pg (ref 26.0–34.0)
MCHC: 32.3 g/dL (ref 30.0–36.0)
MCV: 84.2 fL (ref 80.0–100.0)
Platelets: 174 10*3/uL (ref 150–400)
RBC: 4.75 MIL/uL (ref 4.22–5.81)
RDW: 16.3 % — ABNORMAL HIGH (ref 11.5–15.5)
WBC: 6.6 10*3/uL (ref 4.0–10.5)
nRBC: 0 % (ref 0.0–0.2)

## 2022-08-17 LAB — APTT: aPTT: 133 seconds — ABNORMAL HIGH (ref 24–36)

## 2022-08-17 LAB — HEPARIN LEVEL (UNFRACTIONATED): Heparin Unfractionated: >1.1 IU/mL — ABNORMAL HIGH (ref 0.30–0.70)

## 2022-08-17 LAB — MAGNESIUM: Magnesium: 1.3 mg/dL — ABNORMAL LOW (ref 1.7–2.4)

## 2022-08-17 MED ORDER — MAGNESIUM SULFATE 2 GM/50ML IV SOLN
2.0000 g | Freq: Once | INTRAVENOUS | Status: AC
  Administered 2022-08-17: 2 g via INTRAVENOUS
  Filled 2022-08-17: qty 50

## 2022-08-17 MED ORDER — APIXABAN 5 MG PO TABS
5.0000 mg | ORAL_TABLET | Freq: Two times a day (BID) | ORAL | Status: DC
  Administered 2022-08-17 – 2022-08-23 (×13): 5 mg via ORAL
  Filled 2022-08-17 (×13): qty 1

## 2022-08-17 NOTE — Hospital Course (Signed)
78 y.o. male with medical history significant of ETOH dependence in  remission; BPH; prostate CA; chronic back pain; DM; COPD; HLD; HTN; transient afib; SDH (03/2022); recent PE on Eliquis; and CVA presenting with AMS.  His daughter reports that he was clearly altered today, was in the bathroom unable to pull on his clothes.  He was clearly altered.  This previously happened with UTI.  Since his head bleed, she has noticed progressive cognitive impairment.  +cough, thought it was related to his COPD.

## 2022-08-17 NOTE — Progress Notes (Signed)
  Progress Note   Patient: [name] WOE:[id number] DOB: 1946/03/15 DOA: 08/16/2022     0 DOS: the patient was seen and examined on 08/17/2022   Brief hospital course: 77 y.o. male with medical history significant of ETOH dependence in  remission; BPH; prostate CA; chronic back pain; DM; COPD; HLD; HTN; transient afib; SDH (03/2022); recent PE on Eliquis; and CVA presenting with AMS.  His daughter reports that he was clearly altered today, was in the bathroom unable to pull on his clothes.  He was clearly altered.  This previously happened with UTI.  Since his head bleed, she has noticed progressive cognitive impairment.  +cough, thought it was related to his COPD.   Assessment and Plan: Influenza A -Supportive care -Continue Tamiflu BID -continued on D5 IVF at 75cc/hr -Lactic acidosis is thought to be related to this issue, continue LA trends with IVF   Recent B PE -Hospitalized about 1 month ago (12/15-19) -Continue eliquis   Acute metabolic encephalopathy with underlying apparent cognitive impairment -Patient with SDH after a fall on 9/15 -Daughter has noticed mild progressive cognitive impairment since -UA reviewed, not suggestive of UTI -Passed dysphagia 3 diet -Head CT reviewed. No acute intracranial abnormality with old L parietoccipital infarct, severe chronic small vessel disease, moderate cerebral atrophy   NSVT -Also happened during last hospitalization -Keep K>4, Mag>2 -Continue coreg per home regimen   DM -Last A1c was 7.0, reasonable control -Not on home meds -Cover with moderate-scale SSI    COPD -Unable to use HFA currently so will change to nebs   HTN -Continue carvedilol   HLD -Continue rusuvastatin   BPH -Continue tamsulosin   Chronic diastolic CHF with lymphedema -Lymphedema is better than usual, per daughter -Hold Lasix for now   Afib -Rate controlled with Coreg -Continue Eliquis  Hypomagnesemia -Will replace      Subjective:  Difficult to assess given mentation  Physical Exam: Vitals:   08/17/22 0013 08/17/22 0428 08/17/22 0729 08/17/22 1104  BP: (!) 158/60 92/66 (!) 162/75 127/66  Pulse: 80  69 61  Resp:  '20 20 20  '$ Temp: (!) 102.4 F (39.1 C) 99.3 F (37.4 C) (!) 102.8 F (39.3 C) (!) 100.7 F (38.2 C)  TempSrc: Oral Oral Oral Oral  SpO2: 100%  91% 93%  Weight:  102.1 kg     General exam: asleep, arousable, laying in bed, in nad Respiratory system: Normal respiratory effort, no wheezing Cardiovascular system: regular rate, s1, s2 Gastrointestinal system: Soft, nondistended, positive BS Central nervous system: CN2-12 grossly intact, strength intact Extremities: Perfused, no clubbing Skin: Normal skin turgor, no notable skin lesions seen Psychiatry: unable to assess given menatation  Data Reviewed:  Labs reviewed: Na 136, k 3.7, Cr 1.40, Hgb 12.9  Family Communication: Pt in room, family at bedside  Disposition: Status is: Observation The patient will require care spanning > 2 midnights and should be moved to inpatient because: Severity of illness  Planned Discharge Destination:  Unclear at this time    Author: Marylu Lund, MD 08/17/2022 3:37 PM  For on call review [email or web address].

## 2022-08-17 NOTE — Progress Notes (Signed)
[place] for Heparin>>Eliquis  Indication:  hx of PE (12/23)  Allergies  Allergen Reactions   Ciprofloxacin Other (See Comments)    All over weakness    Patient Measurements: Weight: 102.1 kg (225 lb 1.4 oz) Heparin Dosing Weight: 100 kg  Vital Signs: Temp: 102.8 F (39.3 C) (01/28 0729) Temp Source: Oral (01/28 0729) BP: 162/75 (01/28 0729) Pulse Rate: 69 (01/28 0729)  Labs: Recent Labs    08/16/22 1147 08/16/22 1228 08/16/22 1344 08/17/22 0044 08/17/22 0751  HGB 12.4* 12.9*  --  12.9*  --   HCT 40.5 40.2  --  40.0  --   PLT 176 188  --  174  --   APTT  --   --   --   --  133*  LABPROT 17.5*  --   --   --   --   INR 1.5*  --   --   --   --   HEPARINUNFRC  --   --   --   --  >1.10*  CREATININE  --   --  1.24 1.40*  --      Estimated Creatinine Clearance: 57.3 mL/min (A) (by C-G formula based on SCr of 1.4 mg/dL (H)).   Medical History: Past Medical History:  Diagnosis Date   Adenoma 05/2008   Alcoholism in recovery [facility])    BPH (benign prostatic hyperplasia)    Chronic LBP    Hip & Back -- Sees Dr. Maia Petties @ [place]   CKD (chronic kidney disease)    with AKI requiring brief CRRT 02/2020   Colon polyps    Controlled type 2 diabetes mellitus with neuropathy ([place]) 01/2007   COPD (chronic obstructive pulmonary disease) ([place])    Diverticulitis of colon    ED (erectile dysfunction)    s/p Penile prosthesis (09/2010)   History of prostate cancer    Dr. Karsten Ro   History of sick sinus syndrome    reduced BB dose for Bradycardia   HLD (hyperlipidemia)    Hypertension    Impaired glucose tolerance 12/21/2013   Nephrolithiasis    Osteoarthritis of both hips    PAF (paroxysmal atrial fibrillation) (HCC)    No longer on Amiodarone.  Not on Anticoaguation b/c no recurrence.   Peripheral neuropathy 12/21/2013   Stroke [facility]    left occipital CVA ~ 2018; possible TIA 01/24/22   Subdural hematoma ([place])  04/04/2022   Anticoagulation stopped   Venous insufficiency     Medications:  No current facility-administered medications on file prior to encounter.   Current Outpatient Medications on File Prior to Encounter  Medication Sig Dispense Refill   apixaban (ELIQUIS) 5 MG TABS tablet Take 1 tablet (5 mg total) by mouth 2 (two) times daily. 60 tablet 3   carvedilol (COREG) 6.25 MG tablet Take 1 tablet (6.25 mg total) by mouth 2 (two) times daily with a meal. 60 tablet 3   furosemide (LASIX) 40 MG tablet TAKE 1 TABLET BY MOUTH DAILY .  MAY TAKE AN ADDITIONAL 1 TABLET  BY MOUTH IF NEEDED FOR SWELLING (Patient taking differently: Take 40 mg by mouth daily. TAKE 1 TABLET BY MOUTH DAILY .  MAY TAKE AN ADDITIONAL 1 TABLET  BY MOUTH IF NEEDED FOR SWELLING) 200 tablet 1   potassium chloride (KLOR-CON) 10 MEQ tablet Take 2 tablets (20 mEq total) by mouth daily. 100 tablet 3   PROAIR HFA 108 (90 Base) MCG/ACT inhaler INHALE 2 PUFFS  INTO THE  LUNGS EVERY 6 HOURS AS  NEEDED (Patient taking differently: Inhale 2 puffs into the lungs every 6 (six) hours as needed for shortness of breath or wheezing.) 34 g 3   rosuvastatin (CRESTOR) 20 MG tablet Take 20 mg by mouth every evening.     tamsulosin (FLOMAX) 0.4 MG CAPS capsule Take 1 capsule (0.4 mg total) by mouth daily after supper. 30 capsule 1   Blood Glucose Monitoring Suppl (ACCU-CHEK GUIDE ME) w/Device KIT Use as directed twice per day E11.9 1 kit 0   glucose blood (ACCU-CHEK GUIDE) test strip Use as instructed twice per day E11.9 200 each 12   Lancet Devices (ONETOUCH DELICA PLUS LANCING) MISC      Lancets (ONETOUCH DELICA PLUS [id number]) MISC USE ONCE DAILY 100 each 3     Assessment: 77 y.o. male with history of PE (12/23) on Eliquis PTA. Last dose of Eliquis was recorded as 1/26 '@2100'$ . Patient did not pass swallow test 1/27 and was determined NPO--pharmacy was consulted for heparin management as a result.   Patient passed swallow test this morning.  Pharmacy now consulted for transition from heparin gtt to Eliquis.  aPTT is supratherapeutic at 133, on 1450 units/hr. Heparin level is supratherapeutic at >1.10 due to previous Eliquis dose on 1/26. Hgb 12.9, plt 174--stable. No line issues or signs/symptoms of bleeding reported.  Goal of Therapy:  aPTT 66-102 sec Heparin level 0.3-0.7 units/ml Monitor platelets by anticoagulation protocol: Yes   Plan:  Stop heparin gtt Start apixaban '5mg'$  BID once heparin gtt stopped  Monitor for s/sx of bleeding daily   Billey Gosling, PharmD PGY1 Pharmacy Resident 1/28/202411:45 AM

## 2022-08-17 NOTE — Evaluation (Signed)
Clinical/Bedside Swallow Evaluation Patient Details  Name: Jermarcus Mcfadyen MRN: [id number] Date of Birth: 14-Mar-1946  Today's Date: 08/17/2022 Time: SLP Start Time (ACUTE ONLY): 0850 SLP Stop Time (ACUTE ONLY): 0920 SLP Time Calculation (min) (ACUTE ONLY): 30 min  Past Medical History:  Past Medical History:  Diagnosis Date   Adenoma 05/2008   Alcoholism in recovery [facility])    BPH (benign prostatic hyperplasia)    Chronic LBP    Hip & Back -- Sees Dr. Maia Petties @ [place]   CKD (chronic kidney disease)    with AKI requiring brief CRRT 02/2020   Colon polyps    Controlled type 2 diabetes mellitus with neuropathy ([place]) 01/2007   COPD (chronic obstructive pulmonary disease) ([place])    Diverticulitis of colon    ED (erectile dysfunction)    s/p Penile prosthesis (09/2010)   History of prostate cancer    Dr. Karsten Ro   History of sick sinus syndrome    reduced BB dose for Bradycardia   HLD (hyperlipidemia)    Hypertension    Impaired glucose tolerance 12/21/2013   Nephrolithiasis    Osteoarthritis of both hips    PAF (paroxysmal atrial fibrillation) (HCC)    No longer on Amiodarone.  Not on Anticoaguation b/c no recurrence.   Peripheral neuropathy 12/21/2013   Stroke [facility])    left occipital CVA ~ 2018; possible TIA 01/24/22   Subdural hematoma ([place]) 04/04/2022   Anticoagulation stopped   Venous insufficiency    Past Surgical History:  Past Surgical History:  Procedure Laterality Date   IR ANGIO INTRA EXTRACRAN SEL COM CAROTID INNOMINATE UNI R MOD SED  04/17/2022   LEFT HEART CATH AND CORONARY ANGIOGRAPHY  2003   Normal Coronary Arteries.   NM MYOVIEW LTD  02/21/2020   EF 60%.  Medium sized mild severity defect in the basal inferior, mid inferior and apical inferior location-suggesting of ischemia.  Read as low-intermediate risk.  (On cardiology review this is felt to be a fixed defect either related to prior infarct versus diaphragmatic attenuation.  Felt to be low  risk)   PENILE PROSTHESIS IMPLANT  06/21/2012   Procedure: PENILE PROTHESIS INFLATABLE;  Surgeon: Claybon Jabs, MD;  [facility];  Service: Urology;  Laterality: N/A;  REMOVAL AND REPLACEMENT OF SOME  OF PROSTHESIS (AMS)    PENILE PROSTHESIS PLACEMENT  09/2010   PROSTATECTOMY     RADIOLOGY WITH ANESTHESIA N/A 04/17/2022   Procedure: Right middle meningeal artery embolization;  Surgeon: Consuella Lose, MD;  Location: [facility];  Service: Radiology;  Laterality: N/A;   REMOVAL OF PENILE PROSTHESIS N/A 02/15/2018   Procedure: REMOVAL OF PENILE PROSTHESIS;  Surgeon: Kathie Rhodes, MD;  Location: [facility];  Service: Urology;  Laterality: N/A;   TRANSTHORACIC ECHOCARDIOGRAM  01/2022   EF 70-75%:Marland Kitchen  Hyperdynamic LV with no RWMA.  Mild LVH.  Severely dilated left atrium suggesting notable diastolic dysfunction, but unable to interpret.  Mildly enlarged RV with normal function.  Normal PAP despite moderate dilated RA and increased RAP/CVP.Marland Kitchen  No MS or MR.  No AAS or AI.  Aortic root measuring 42 mm with ascending aorta measuring 41 mm.   TRANSTHORACIC ECHOCARDIOGRAM  02/17/2020   EF 60 to 65%.  No R WMA.  Unable to assess diastolic parameters because of A. fib.  Mildly elevated PA pressures.  Mild LA dilation.  Mild aortic valve sclerosis but no stenosis.  Normal IVC.   HPI:  Patient is a 77 y.o. male  with PMH: ETOH dependence in remission, BPH, prostate CA, chronic back pain, DM, COPD, HLD, HTN, transient a-fib, SDH (03/2022), recent PE, CVA. He presented to the hospital from home (lives with daughter) on 08/16/22 with AMS after daughter observed him to be altered, in bathroom unable to pull on his clothes. (this has previously occured with UTI) Since his SDH, daughter has noticed progressive cognitive impairment.  In ER, CXR unchanged, head CT negative, postive for influenza. He was made NPO pending SLP swallow evaluation secondary to AMS.    Assessment / Plan / Recommendation   Clinical Impression  Patient presenting with clinical s/s of what appears to be a cognitive-based oral phase dysphagia but with pharyngeal phase appearing WFL. Patient required frequent verbal, tactile, visual cues to direct and redirect attention. (daughter in room reports this is not new) He was able to hold cup in hands and with some hand over hand assist, drank thin liquids (water) via straw sips. Swallow initiation appeared timely and no overt s/s aspiration or penetration observed. He did exhibit prolonged mastication and oral transit of solids (graham cracker) but with full clearance of oral cavity post swallow. SLP recommending initiate PO diet of Dys 3 (mechanical soft) solids and thin liquids with full supervision and feeding assist as needed. SLP will follow for diet toleration and ability to advance with solids. SLP Visit Diagnosis: Dysphagia, unspecified (R13.10)    Aspiration Risk  Mild aspiration risk    Diet Recommendation Dysphagia 3 (Mech soft);Thin liquid   Liquid Administration via: Cup;Straw Medication Administration: Whole meds with liquid Supervision: Patient able to self feed;Full supervision/cueing for compensatory strategies Compensations: Slow rate;Small sips/bites;Minimize environmental distractions Postural Changes: Seated upright at 90 degrees    Other  Recommendations Oral Care Recommendations: Oral care BID    Recommendations for follow up therapy are one component of a multi-disciplinary discharge planning process, led by the attending physician.  Recommendations may be updated based on patient status, additional functional criteria and insurance authorization.  Follow up Recommendations Skilled nursing-short term rehab (<3 hours/day)      Assistance Recommended at Discharge    Functional Status Assessment Patient has had a recent decline in their functional status and demonstrates the ability to make significant improvements in function in a reasonable and  predictable amount of time.  Frequency and Duration min 1 x/week  1 week       Prognosis Prognosis for Safe Diet Advancement: Good Barriers to Reach Goals: Cognitive deficits      Swallow Study   General Date of Onset: 08/16/22 HPI: Patient is a 77 y.o. male with PMH: ETOH dependence in remission, BPH, prostate CA, chronic back pain, DM, COPD, HLD, HTN, transient a-fib, SDH (03/2022), recent PE, CVA. He presented to the hospital from home (lives with daughter) on 08/16/22 with AMS after daughter observed him to be altered, in bathroom unable to pull on his clothes. (this has previously occured with UTI) Since his SDH, daughter has noticed progressive cognitive impairment.  In ER, CXR unchanged, head CT negative, postive for influenza. He was made NPO pending SLP swallow evaluation secondary to AMS. Type of Study: Bedside Swallow Evaluation Previous Swallow Assessment: none found Diet Prior to this Study: NPO Temperature Spikes Noted: Yes (102.8 1/28 0729) Respiratory Status: Room air History of Recent Intubation: No Behavior/Cognition: Alert;Confused;Requires cueing;Distractible Oral Cavity Assessment: Within Functional Limits Oral Care Completed by SLP: Yes Oral Cavity - Dentition: Dentures, top;Dentures, bottom Self-Feeding Abilities: Needs assist;Needs set up Patient Positioning: Upright in bed  Baseline Vocal Quality: Normal Volitional Cough: Cognitively unable to elicit Volitional Swallow: Unable to elicit    Oral/Motor/Sensory Function Overall Oral Motor/Sensory Function: Within functional limits   Ice Chips     Thin Liquid Thin Liquid: Within functional limits Presentation: Straw    Nectar Thick     Honey Thick     Puree Puree: Not tested   Solid     Solid: Impaired Oral Phase Impairments: Impaired mastication Oral Phase Functional Implications: Prolonged oral transit;Impaired mastication     Sonia Baller, MA, CCC-SLP Speech Therapy

## 2022-08-17 NOTE — Evaluation (Signed)
Physical Therapy Evaluation Patient Details Name: [name] MRN: [id number] DOB: 07/02/1946 Today's Date: 08/17/2022  History of Present Illness  Pt is a 77 y.o. M who presens 08/16/2022 with AMS. CT head negative, positive for influenza. Significant PMH:  ETOH dependence in remission, BPH, prostate CA, chronic back pain, DM, COPD, HLD, HTN, transient a-fib, SDH (03/2022), recent PE, CVA.  Clinical Impression  PTA, pt lives with his daughter, requires assist for ADL's, and is able to ambulate short distances with a walker. Pt presents with a gross change in baseline, with poor sitting balance, impaired cognition and command following, truncal weakness, and decreased activity tolerance. Pt requiring up to two person maximal assist for bed mobility and is unable to stand. Recommend ST SNF to address deficits, maximize functional mobility and decrease caregiver burden.     Recommendations for follow up therapy are one component of a multi-disciplinary discharge planning process, led by the attending physician.  Recommendations may be updated based on patient status, additional functional criteria and insurance authorization.  Follow Up Recommendations Skilled nursing-short term rehab (<3 hours/day) Can patient physically be transported by private vehicle: No    Assistance Recommended at Discharge Frequent or constant Supervision/Assistance  Patient can return home with the following  Two people to help with walking and/or transfers;A lot of help with bathing/dressing/bathroom    Equipment Recommendations Other (comment) (defer)  Recommendations for Other Services       Functional Status Assessment Patient has had a recent decline in their functional status and demonstrates the ability to make significant improvements in function in a reasonable and predictable amount of time.     Precautions / Restrictions Precautions Precautions: Fall Restrictions Weight Bearing Restrictions: No       Mobility  Bed Mobility Overal bed mobility: Needs Assistance Bed Mobility: Supine to Sit, Sit to Supine, Rolling Rolling: Max assist, +2 for safety/equipment   Supine to sit: Max assist Sit to supine: Max assist, +2 for physical assistance   General bed mobility comments: Heavy maxA to progress to sitting up on edge of bed. Pt resistive with heavy retropulsion; cannot correct with max multimodal cueing. MaxA + 2 to return to supine    Transfers                   General transfer comment: Attempted; able to clear hips minimally, heavy retropulsion    Ambulation/Gait                  Stairs            Wheelchair Mobility    Modified Rankin (Stroke Patients Only)       Balance Overall balance assessment: Needs assistance Sitting-balance support: Feet supported Sitting balance-Leahy Scale: Zero Sitting balance - Comments: requiring max-totalA, heavy retropulsion even with bilateral hand support                                     Pertinent Vitals/Pain Pain Assessment Pain Assessment: Faces Faces Pain Scale: Hurts little more Pain Location: back when working with patient sitting EOB Pain Descriptors / Indicators: Discomfort, Grimacing, Moaning Pain Intervention(s): Limited activity within patient's tolerance, Monitored during session    Home Living Family/patient expects to be discharged to:: Skilled nursing facility                   Additional Comments: Previously living with his daughter  Prior Function Prior Level of Function : Needs assist             Mobility Comments: using RW short, household distances ADLs Comments: Typically continent and able to take self to bathroom, assist with showering     Hand Dominance   Dominant Hand: Right    Extremity/Trunk Assessment   Upper Extremity Assessment Upper Extremity Assessment: Defer to OT evaluation    Lower Extremity Assessment Lower Extremity  Assessment: Generalized weakness       Communication   Communication: No difficulties  Cognition Arousal/Alertness: Awake/alert Behavior During Therapy: Flat affect Overall Cognitive Status: Impaired/Different from baseline Area of Impairment: Orientation, Memory, Following commands, Safety/judgement, Awareness, Problem solving, Attention                 Orientation Level: Disoriented to, Place, Time, Situation Current Attention Level: Sustained Memory: Decreased short-term memory Following Commands: Follows one step commands inconsistently Safety/Judgement: Decreased awareness of safety, Decreased awareness of deficits Awareness: Intellectual Problem Solving: Slow processing, Difficulty sequencing, Requires verbal cues General Comments: Pt oriented to self only and following 1 step commands inonsistently. Resistive to movement        General Comments      Exercises     Assessment/Plan    PT Assessment Patient needs continued PT services  PT Problem List Decreased strength;Decreased activity tolerance;Decreased balance;Decreased mobility;Decreased cognition       PT Treatment Interventions DME instruction;Gait training;Functional mobility training;Therapeutic activities;Therapeutic exercise;Balance training;Patient/family education    PT Goals (Current goals can be found in the Care Plan section)  Acute Rehab PT Goals Patient Stated Goal: pt daughter agreeable to rehab PT Goal Formulation: With patient/family Time For Goal Achievement: 08/31/22 Potential to Achieve Goals: Fair    Frequency Min 2X/week     Co-evaluation               AM-PAC PT "6 Clicks" Mobility  Outcome Measure Help needed turning from your back to your side while in a flat bed without using bedrails?: A Lot Help needed moving from lying on your back to sitting on the side of a flat bed without using bedrails?: A Lot Help needed moving to and from a bed to a chair (including a  wheelchair)?: Total Help needed standing up from a chair using your arms (e.g., wheelchair or bedside chair)?: Total Help needed to walk in hospital room?: Total Help needed climbing 3-5 steps with a railing? : Total 6 Click Score: 8    End of Session   Activity Tolerance: Other (comment) (limited by cognition and command following) Patient left: in bed;with call bell/phone within reach;with bed alarm set Nurse Communication: Mobility status PT Visit Diagnosis: Muscle weakness (generalized) (M62.81);Other abnormalities of gait and mobility (R26.89)    Time: 7591-6384 PT Time Calculation (min) (ACUTE ONLY): 43 min   Charges:   PT Evaluation $PT Eval Moderate Complexity: 1 Mod PT Treatments $Therapeutic Activity: 23-37 mins        Wyona Almas, PT, DPT Acute Rehabilitation Services Office [phone]   Deno Etienne 08/17/2022, 10:53 AM

## 2022-08-18 DIAGNOSIS — R7402 Elevation of levels of lactic acid dehydrogenase (LDH): Secondary | ICD-10-CM | POA: Diagnosis not present

## 2022-08-18 DIAGNOSIS — J101 Influenza due to other identified influenza virus with other respiratory manifestations: Secondary | ICD-10-CM | POA: Diagnosis not present

## 2022-08-18 DIAGNOSIS — G9340 Encephalopathy, unspecified: Secondary | ICD-10-CM | POA: Diagnosis not present

## 2022-08-18 LAB — MAGNESIUM: Magnesium: 2.2 mg/dL (ref 1.7–2.4)

## 2022-08-18 LAB — COMPREHENSIVE METABOLIC PANEL
ALT: 15 U/L (ref 0–44)
AST: 43 U/L — ABNORMAL HIGH (ref 15–41)
Albumin: 2.4 g/dL — ABNORMAL LOW (ref 3.5–5.0)
Alkaline Phosphatase: 122 U/L (ref 38–126)
Anion gap: 10 (ref 5–15)
BUN: 12 mg/dL (ref 8–23)
CO2: 21 mmol/L — ABNORMAL LOW (ref 22–32)
Calcium: 8.5 mg/dL — ABNORMAL LOW (ref 8.9–10.3)
Chloride: 104 mmol/L (ref 98–111)
Creatinine, Ser: 1.3 mg/dL — ABNORMAL HIGH (ref 0.61–1.24)
GFR, Estimated: 57 mL/min — ABNORMAL LOW (ref >60)
Glucose, Bld: 117 mg/dL — ABNORMAL HIGH (ref 70–99)
Potassium: 3.6 mmol/L (ref 3.5–5.1)
Sodium: 135 mmol/L (ref 135–145)
Total Bilirubin: 0.5 mg/dL (ref 0.3–1.2)
Total Protein: 7.6 g/dL (ref 6.5–8.1)

## 2022-08-18 LAB — CBC
HCT: 43.5 % (ref 39.0–52.0)
Hemoglobin: 13.6 g/dL (ref 13.0–17.0)
MCH: 26.7 pg (ref 26.0–34.0)
MCHC: 31.3 g/dL (ref 30.0–36.0)
MCV: 85.3 fL (ref 80.0–100.0)
Platelets: 178 10*3/uL (ref 150–400)
RBC: 5.1 MIL/uL (ref 4.22–5.81)
RDW: 16.6 % — ABNORMAL HIGH (ref 11.5–15.5)
WBC: 3.9 10*3/uL — ABNORMAL LOW (ref 4.0–10.5)
nRBC: 0 % (ref 0.0–0.2)

## 2022-08-18 LAB — HEPARIN LEVEL (UNFRACTIONATED): Heparin Unfractionated: >1.1 IU/mL — ABNORMAL HIGH (ref 0.30–0.70)

## 2022-08-18 LAB — URINE CULTURE: Culture: NO GROWTH

## 2022-08-18 LAB — APTT: aPTT: 38 seconds — ABNORMAL HIGH (ref 24–36)

## 2022-08-18 MED ORDER — CARVEDILOL 12.5 MG PO TABS
12.5000 mg | ORAL_TABLET | Freq: Two times a day (BID) | ORAL | Status: DC
  Administered 2022-08-18 – 2022-08-23 (×11): 12.5 mg via ORAL
  Filled 2022-08-18 (×11): qty 1

## 2022-08-18 MED ORDER — ENSURE ENLIVE PO LIQD
237.0000 mL | Freq: Three times a day (TID) | ORAL | Status: DC
  Administered 2022-08-18 – 2022-08-20 (×4): 237 mL via ORAL

## 2022-08-18 MED ORDER — ADULT MULTIVITAMIN W/MINERALS CH
1.0000 | ORAL_TABLET | Freq: Every day | ORAL | Status: DC
  Administered 2022-08-18 – 2022-08-23 (×6): 1 via ORAL
  Filled 2022-08-18 (×6): qty 1

## 2022-08-18 MED ORDER — POTASSIUM CHLORIDE CRYS ER 20 MEQ PO TBCR
60.0000 meq | EXTENDED_RELEASE_TABLET | Freq: Once | ORAL | Status: AC
  Administered 2022-08-18: 60 meq via ORAL
  Filled 2022-08-18: qty 3

## 2022-08-18 NOTE — Progress Notes (Signed)
TR[facility] night cross cover note:   I was notified by RN that the patient had a 17 beat run of nonsustained V. tach on telemetry, before returning to sinus rhythm with insignia rates in the 60s to 70s.  Systolic blood pressures in the 140s mmHg.  Patient asymptomatic at the time.   Per my chart review, the patient has had some similar runs of nonsustained V. tach over the course of this current hospitalization.  He has a CMP and serum magnesium level ordered for this morning, with results currently pending.     Babs Bertin, DO Hospitalist

## 2022-08-18 NOTE — Evaluation (Signed)
Occupational Therapy Evaluation Patient Details Name: [name] MRN: [id number] DOB: [birth year] Today's Date: 08/18/2022   History of Present Illness Pt is a 77 y.o. M who presens 08/16/2022 with AMS. CT head negative, positive for influenza. Significant PMH:  ETOH dependence in remission, BPH, prostate CA, chronic back pain, DM, COPD, HLD, HTN, transient a-fib, SDH (03/2022), recent PE, CVA.   Clinical Impression   Pt reports using RW and is ind with ADLs, reports daughter assists him with med mgmt. Pt currently needing min-max A for ADLs, max A +2 for bed mobility and sit to stand transfer x2 from EOB. Pt with strong posterior lean in sitting, keeps trunk flexed when standing. VSS throughout. Pt presenting with impairments listed below, will follow acutely. Recommend SNF at d/c.     Recommendations for follow up therapy are one component of a multi-disciplinary discharge planning process, led by the attending physician.  Recommendations may be updated based on patient status, additional functional criteria and insurance authorization.   Follow Up Recommendations  Skilled nursing-short term rehab (<3 hours/day)     Assistance Recommended at Discharge Frequent or constant Supervision/Assistance  Patient can return home with the following A lot of help with bathing/dressing/bathroom;Assistance with cooking/housework;Direct supervision/assist for medications management;Direct supervision/assist for financial management;Assist for transportation;Help with stairs or ramp for entrance;Two people to help with walking and/or transfers    Functional Status Assessment  Patient has had a recent decline in their functional status and demonstrates the ability to make significant improvements in function in a reasonable and predictable amount of time.  Equipment Recommendations  Other (comment) (defer)    Recommendations for Other Services PT consult     Precautions / Restrictions  Precautions Precautions: Fall Restrictions Weight Bearing Restrictions: No      Mobility Bed Mobility Overal bed mobility: Needs Assistance Bed Mobility: Supine to Sit, Sit to Supine, Rolling     Supine to sit: Max assist, +2 for physical assistance Sit to supine: Max assist, +2 for physical assistance        Transfers Overall transfer level: Needs assistance Equipment used: Rolling walker (2 wheels) Transfers: Sit to/from Stand Sit to Stand: Max assist, +2 physical assistance           General transfer comment: cues to stand upright posture, keeps trunk flexed      Balance Overall balance assessment: Needs assistance Sitting-balance support: Feet supported Sitting balance-Leahy Scale: Poor Sitting balance - Comments: min A at best to sit EOB, fatigues, leans posteriorly quickly   Standing balance support: During functional activity, Reliant on assistive device for balance Standing balance-Leahy Scale: Poor Standing balance comment: reliant on external and 2 person support                           ADL either performed or assessed with clinical judgement   ADL Overall ADL's : Needs assistance/impaired Eating/Feeding: Sitting;Minimal assistance   Grooming: Minimal assistance;Sitting   Upper Body Bathing: Moderate assistance;Sitting   Lower Body Bathing: Maximal assistance;Sitting/lateral leans   Upper Body Dressing : Moderate assistance;Sitting   Lower Body Dressing: Maximal assistance;Sitting/lateral leans;Sit to/from stand   Toilet Transfer: Maximal assistance;+2 for physical assistance           Functional mobility during ADLs: Maximal assistance;+2 for physical assistance       Vision   Additional Comments: will further assess, reaching out for IV pole when asked to place hand on RW     Perception  Perception Perception Tested?: No   Praxis Praxis Praxis tested?: Not tested    Pertinent Vitals/Pain Pain Assessment Pain  Assessment: No/denies pain     Hand Dominance Right   Extremity/Trunk Assessment Upper Extremity Assessment Upper Extremity Assessment: Generalized weakness   Lower Extremity Assessment Lower Extremity Assessment: Defer to PT evaluation   Cervical / Trunk Assessment Cervical / Trunk Assessment: Normal   Communication Communication Communication: No difficulties   Cognition Arousal/Alertness: Awake/alert Behavior During Therapy: Flat affect Overall Cognitive Status: Impaired/Different from baseline Area of Impairment: Orientation, Memory, Following commands, Safety/judgement, Awareness, Problem solving, Attention                 Orientation Level: Disoriented to, Place, Situation Current Attention Level: Sustained Memory: Decreased short-term memory Following Commands: Follows one step commands with increased time Safety/Judgement: Decreased awareness of safety, Decreased awareness of deficits Awareness: Intellectual Problem Solving: Slow processing, Difficulty sequencing, Requires verbal cues General Comments: A & O x2, increased time to follow commands     General Comments  VSS on RA    Exercises     Shoulder Instructions      Home Living Family/patient expects to be discharged to:: Skilled nursing facility                                 Additional Comments: Previously living with his daughters, grandchildren?      Prior Functioning/Environment Prior Level of Function : Needs assist             Mobility Comments: using RW short, household distances ADLs Comments: Typically continent and able to take self to bathroom, assist with showering        OT Problem List: Decreased strength;Decreased range of motion;Decreased activity tolerance;Impaired balance (sitting and/or standing);Decreased safety awareness;Impaired UE functional use      OT Treatment/Interventions: Self-care/ADL training;Therapeutic exercise;DME and/or AE  instruction;Energy conservation;Therapeutic activities;Patient/family education;Balance training;Cognitive remediation/compensation    OT Goals(Current goals can be found in the care plan section) Acute Rehab OT Goals Patient Stated Goal: none stated OT Goal Formulation: With patient Time For Goal Achievement: 09/01/22 Potential to Achieve Goals: Good ADL Goals Pt Will Perform Upper Body Dressing: with min assist;sitting Pt Will Perform Lower Body Dressing: with min assist;sitting/lateral leans;sit to/from stand Pt Will Transfer to Toilet: with min assist;ambulating;regular height toilet  OT Frequency: Min 2X/week    Co-evaluation              AM-PAC OT "6 Clicks" Daily Activity     Outcome Measure Help from another person eating meals?: A Little Help from another person taking care of personal grooming?: A Little Help from another person toileting, which includes using toliet, bedpan, or urinal?: A Lot Help from another person bathing (including washing, rinsing, drying)?: A Lot Help from another person to put on and taking off regular upper body clothing?: A Lot Help from another person to put on and taking off regular lower body clothing?: Total 6 Click Score: 13   End of Session Equipment Utilized During Treatment: Gait belt;Rolling walker (2 wheels) Nurse Communication: Mobility status  Activity Tolerance: Patient tolerated treatment well Patient left: in bed;with call bell/phone within reach;with bed alarm set  OT Visit Diagnosis: Unsteadiness on feet (R26.81);Other abnormalities of gait and mobility (R26.89);Muscle weakness (generalized) (M62.81)                Time: 9767-3419 OT Time Calculation (min): 28 min Charges:  OT General Charges $OT Visit: 1 Visit OT Evaluation $OT Eval Moderate Complexity: 1 Mod OT Treatments $Therapeutic Activity: 8-22 mins  Renaye Rakers, OTD, OTR/L SecureChat Preferred Acute Rehab [phone]   Ulla Gallo 08/18/2022,  4:31 PM

## 2022-08-18 NOTE — Progress Notes (Signed)
Speech Language Pathology Treatment: Dysphagia  Patient Details Name: [name] MRN: [id number] DOB: 12-20-1945 Today's Date: 08/18/2022 Time: 1000-1020 SLP Time Calculation (min) (ACUTE ONLY): 20 min  Assessment / Plan / Recommendation Clinical Impression  Pt seen at bedside for skilled ST intervention targeting goals for safe and adequate PO intake. RN reported pt refused breakfast this morning. Pt was unsure if he ate dinner last night or not.  Pt was willing to accept limited PO's this morning. Trials were limited to puree and glucerna. No oral leakage observed, and no overt s/s aspiration. Recommend dietician consult to assist with identifying appropriate supplements. SLP will follow for assessment of diet tolerance and continued education.    HPI HPI: Patient is a 77 y.o. male with PMH: ETOH dependence in remission, BPH, prostate CA, chronic back pain, DM, COPD, HLD, HTN, transient a-fib, SDH (03/2022), recent PE, CVA. He presented to the hospital from home (lives with daughter) on 08/16/22 with AMS after daughter observed him to be altered, in bathroom unable to pull on his clothes. (this has previously occured with UTI) Since his SDH, daughter has noticed progressive cognitive impairment.  In ER, CXR unchanged, head CT negative, postive for influenza. He was made NPO pending SLP swallow evaluation secondary to AMS.      SLP Plan  Continue with current plan of care      Recommendations for follow up therapy are one component of a multi-disciplinary discharge planning process, led by the attending physician.  Recommendations may be updated based on patient status, additional functional criteria and insurance authorization.    Recommendations  Diet recommendations: Dysphagia 3 (mechanical soft) Liquids provided via: Cup;Straw Medication Administration: Whole meds with liquid Compensations: Slow rate;Small sips/bites;Minimize environmental distractions Postural Changes and/or  Swallow Maneuvers: Seated upright 90 degrees;Upright 30-60 min after meal                Oral Care Recommendations: Oral care BID Follow Up Recommendations: Skilled nursing-short term rehab (<3 hours/day) Assistance recommended at discharge: Frequent or constant Supervision/Assistance SLP Visit Diagnosis: Dysphagia, unspecified (R13.10) Plan: Continue with current plan of care          Kenyata Napier B. Quentin Ore, [facility], [place] Speech Language Pathologist Office: [phone]  Shonna Chock 08/18/2022, 10:29 AM

## 2022-08-18 NOTE — TOC Initial Note (Signed)
Transition of Care [facility]) - Initial/Assessment Note    Patient Details  Name: [name] MRN: [id number] Date of Birth: May 29, 1946  Transition of Care [facility]) CM/SW Contact:    Bjorn Pippin, LCSW Phone Number: 08/18/2022, 4:35 PM  Clinical Narrative:   CSW spoke with pt daughter Daleen Snook in regards to recommendation for SNF. Daughter is agreeable  and believes pt will benefit for STR.      Daughter expressed that pt has gone to STR twice previously. Pt has gone to [facility] and [facility]. Daughter reports pt recently fell and has been extremely disoriented since and feels that dc to a SNF for STR could be promote pt wellness. Daughter reports that pt has [place] PT and RN set up through [facility].  CSW will complete fl2 and fax out. TOC will continue to follow.               4:45PM Pt daughter [name] reported to [place] that pt has a SW with [facility] and she is in the process of setting pt up for STR at [facility].   Expected Discharge Plan: Skilled Nursing Facility Barriers to Discharge: Continued Medical Work up   Patient Goals and CMS Choice            Expected Discharge Plan and Services     Post Acute Care Choice: [place] Living arrangements for the past 2 months: [place]:  (Is active with [facility])          Prior Living Arrangements/Services Living arrangements for the past 2 months: Single Family Home Lives with:: Adult Children Patient language and need for interpreter reviewed:: Yes        Need for Family Participation in Patient Care: Yes (Comment) Care giver support system in place?: Yes (comment) Current home services: DME, Home PT Criminal Activity/Legal Involvement Pertinent to Current Situation/Hospitalization: No - Comment as needed  Activities of Daily Living      Permission Sought/Granted                  Emotional Assessment       Orientation: : Oriented to  Self Alcohol / Substance Use: Not Applicable Psych Involvement: No (comment)  Admission diagnosis:  Influenza A [J10.1] Encephalopathy [G93.40] Elevated serum lactate dehydrogenase [R74.02] Influenza [J11.1] Patient Active Problem List   Diagnosis Date Noted   Influenza A 08/16/2022   History of pulmonary embolism 33/82/5053   Acute metabolic encephalopathy 97/67/3419   MCI (mild cognitive impairment) 08/16/2022   Influenza 08/16/2022   Hypokalemia 08/07/2022   Physical deconditioning 08/07/2022   Bilateral pulmonary embolism ([place]) 07/05/2022   UTI (urinary tract infection) 07/04/2022   Urinary tract infection without hematuria 06/28/2022   Confusion 06/28/2022   Hypercoagulable state due to longstanding persistent atrial fibrillation ([place]) 06/08/2022   Foul smelling urine 04/30/2022   Nausea and vomiting 04/30/2022   SDH (subdural hematoma) ([place]) 04/17/2022   Subdural hematoma ([place]) 04/04/2022   New daily persistent headache 04/02/2022   COVID-19 01/26/2022   Hypomagnesemia 01/25/2022   CVA (cerebral vascular accident) ([place]) 01/24/2022   Cough 01/24/2022   Elevated troponin 01/24/2022   Fever 01/24/2022   Bradycardia 11/21/2021   Vitamin D deficiency 11/21/2021   Other fatigue 11/18/2021   Bilateral leg edema 07/08/2021   Gait disorder 12/16/2020   HTN (hypertension)  12/16/2020   Aortic atherosclerosis ([place]) 12/12/2020   Diverticulitis 10/23/2020   Fall 10/22/2020   Sepsis ([place]) 10/22/2020   Acute lower UTI 10/22/2020   NSVT (nonsustained ventricular tachycardia) (HCC)    Multiple myeloma ([place])    AKI (acute kidney injury) ([place]) 02/15/2020   General weakness    Diabetic leg ulcer ([place]) 10/06/2019   Chronic kidney disease, stage 3b ([place]) 10/06/2019   Diabetic ulcer of right lower leg ([place]) 02/23/2019   Abnormal LFTs 04/27/2018   Open wound of scrotum 03/01/2018   Acute on chronic diastolic CHF (congestive heart failure) ([place]) 02/25/2018   Postoperative anemia due  to acute blood loss 02/25/2018   Occipital stroke ([place]), left 09/23/2017   Diabetes mellitus type II, non insulin dependent ([place])    Encounter for well adult exam with abnormal findings 07/31/2017   Venous insufficiency (chronic) (peripheral) with chronic lower extremity edema 04/04/2016   Junctional tachycardia 03/18/2015   Exertional dyspnea 07/05/2014   Obesity (BMI 30-39.9) 05/04/2014   Benign essential tremor 05/04/2014   Peripheral neuropathy 12/21/2013   Nonspecific abnormal finding in stool contents 09/06/2012   Heme positive stool 08/09/2012   Difficulty passing stool 08/09/2012   Sebaceous cyst 04/15/2012   Complex renal cyst 01/13/2011   Back pain 01/02/2011   Permanent atrial fibrillation ([place]) - now off amiodarone; asymptomatic. CHA2DS2-VASc Score -5 (on Xarelto) 01/02/2011   KNEE PAIN, LEFT 08/27/2009   COLONIC POLYPS, HX OF 08/08/2008   ERECTILE DYSFUNCTION 09/27/2007   ULNAR NEUROPATHY 09/27/2007   VENTRICULAR HYPERTROPHY, LEFT 09/27/2007   ALLERGIC RHINITIS 09/27/2007   Arthropathy 09/27/2007   GANGLION CYST, WRIST, LEFT 09/27/2007   HEMORRHOIDS, INTERNAL 06/10/2007   DIVERTICULOSIS, COLON 06/10/2007   DISC DISEASE, LUMBAR 06/03/2007   NEPHROLITHIASIS, HX OF 06/03/2007   Diabetes mellitus with neuropathy ([place]) 02/11/2007   Hyperlipidemia with target LDL less than 100 02/11/2007   ANXIETY 02/11/2007   Nondependent Alcohol Abuse, in Remission 02/11/2007   Hypertensive heart disease with chronic diastolic congestive heart failure ([place]) 02/11/2007   COPD (chronic obstructive pulmonary disease) ([place]) 02/11/2007   BPH (benign prostatic hyperplasia) 02/11/2007   LOW BACK PAIN 02/11/2007   Disturbance of skin sensation 02/11/2007   PROSTATE CANCER, HX OF 02/11/2007   PCP:  Biagio Borg, MD Pharmacy:   [address] ([place]), [place] - [address] [place] ([place]) [place] [address] Phone: [phone] Fax:  [phone]     Social Determinants of Health ([place]) Social History: [place]: No Food Insecurity (07/10/2022)  Housing: Low Risk  (07/10/2022)  Transportation Needs: No Transportation Needs (07/10/2022)  Utilities: Not At Risk (07/05/2022)  Depression (PHQ2-9): Low Risk  (08/07/2022)  Tobacco Use: Medium Risk (08/16/2022)   SDOH Interventions:     Readmission Risk Interventions     No data to display          Beckey Rutter, MSW, LCSWA, LCASA Transitions of Care  Clinical Social Worker I

## 2022-08-18 NOTE — Progress Notes (Signed)
Patient had 17 runs of vtachs. Checked the patient and he's lying comfortably on bed. MD notified. Will continue to monitor.

## 2022-08-18 NOTE — Progress Notes (Signed)
  Progress Note   Patient: [name] OZD:[id number] DOB: [birth year] DOA: 08/16/2022     1 DOS: the patient was seen and examined on 08/18/2022   Brief hospital course: 77 y.o. male with medical history significant of ETOH dependence in  remission; BPH; prostate CA; chronic back pain; DM; COPD; HLD; HTN; transient afib; SDH (03/2022); recent PE on Eliquis; and CVA presenting with AMS.  His daughter reports that he was clearly altered today, was in the bathroom unable to pull on his clothes.  He was clearly altered.  This previously happened with UTI.  Since his head bleed, she has noticed progressive cognitive impairment.  +cough, thought it was related to his COPD.   Assessment and Plan: Influenza A -Supportive care -Continue Tamiflu BID -continued on D5 IVF at 75cc/hr -Lactic acidosis is thought to be related to this issue -Seen by PT/OT with recs thus far for SNF   Recent B PE -Hospitalized about 1 month ago (12/15-19) -Continue eliquis, no evidence of acute blood loss   Acute metabolic encephalopathy with underlying apparent cognitive impairment -Patient with SDH after a fall on 9/15 -Daughter has noticed mild progressive cognitive impairment since -UA reviewed, not suggestive of UTI -Passed dysphagia 3 diet, consulted dietitian to eval nutritional status -Head CT reviewed. No acute intracranial abnormality with old L parietoccipital infarct, severe chronic small vessel disease, moderate cerebral atrophy -This AM, mentation seems much improved compared to yesterday. More oriented   NSVT -Recurrent episode this AM -Keep K>4, Mag>2 -K 3.6, will optimize. Mg 2.2 -Increased Coreg to 12.'5mg'$  bid -Will check limited echo   DM -Last A1c was 7.0, reasonable control -Not on home meds -Cover with moderate-scale SSI  -glycemic trends stable   COPD -Unable to use HFA currently so will change to nebs   HTN -Continue carvedilol   HLD -Continue rusuvastatin   BPH -Continue  tamsulosin   Chronic diastolic CHF with lymphedema -Lymphedema is better than usual, per daughter -Hold Lasix for now -Will hold further fluids later tonight, encourage PO -Wt up 1kg today. Likely resume lasix tomorrow   Afib -Rate controlled with Coreg -Continue Eliquis  Hypomagnesemia -Now corrected      Subjective: Reports feeling better today. Less confused. Did not remember events yesterday  Physical Exam: Vitals:   08/17/22 2007 08/18/22 0016 08/18/22 0750 08/18/22 1553  BP: (!) 153/64 (!) 144/70 (!) 157/98 (!) 151/90  Pulse: 68 61 70 61  Resp: '18 18 17 18  '$ Temp: 99.9 F (37.7 C) 98.9 F (37.2 C) 98.5 F (36.9 C) 98.4 F (36.9 C)  TempSrc: Oral Oral Oral Oral  SpO2: 98% 91% 98% 98%  Weight:  103.7 kg     General exam: Conversant, in no acute distress Respiratory system: normal chest rise, clear, no audible wheezing Cardiovascular system: regular rhythm, s1-s2 Gastrointestinal system: Nondistended, nontender, pos BS Central nervous system: No seizures, no tremors Extremities: No cyanosis, no joint deformities Skin: No rashes, no pallor Psychiatry: Affect normal // no auditory hallucinations   Data Reviewed:  Labs reviewed: Na 135, K 3.6, Cr 1.30  Family Communication: Pt in room, family at bedside  Disposition: Status is: Inpatient Continue inpatient stay because: Severity of illness  Planned Discharge Destination: Skilled nursing facility    Author: Marylu Lund, MD 08/18/2022 3:57 PM  For on call review [email or web address].

## 2022-08-18 NOTE — Progress Notes (Signed)
Initial Nutrition Assessment  DOCUMENTATION CODES:  Not applicable  INTERVENTION:  Continue current diet as ordered by SLP Ensure Enlive po TID, each supplement provides 350 kcal and 20 grams of protein. Magic cup TID with meals, each supplement provides 290 kcal and 9 grams of protein MVI with minerals daily  NUTRITION DIAGNOSIS:   Inadequate oral intake related to poor appetite as evidenced by meal completion < 25%.  GOAL:   Patient will meet greater than or equal to 90% of their needs  MONITOR:   PO intake, Supplement acceptance, Labs, Diet advancement  REASON FOR ASSESSMENT:   Consult  (nutritional goals)  ASSESSMENT:   Pt with hx of EtOH abuse (in remission), hx prostate cancer, DM type 2, CKD, COPD, atrial fibrillation, and hx CVA presented to ED with AMS. Found to be flu+  1/28 - SLP evaluation, DYS 3, thin liquids  Pt working with SLP at the time of assessment. Poor intake recorded so far this admission and pt has refused several meals. Will add PO supplements in hopes that they can assist in meeting needs, but may need to consider alternate meals of nutrition if pt continues to refuse PO. Requiring dextrose containing IVF to maintain glucose in desirable range.   Average Meal Intake: 1/28-1/29: 10% intake x 2 recorded meals  Nutritionally Relevant Medications: Scheduled Meds:  docusate sodium  100 mg Oral BID   rosuvastatin  20 mg Oral QPM   Continuous Infusions:  dextrose 5% lactated ringers 75 mL/hr at 08/18/22 0834   PRN Meds: bisacodyl, ondansetron, polyethylene glycol  Labs Reviewed: Creatinine 1.3 CBG ranges from 85-136 mg/dL over the last 24 hours  NUTRITION - FOCUSED PHYSICAL EXAM: Defer to in-person assessment.  Diet Order:   Diet Order             DIET DYS 3 Fluid consistency: Thin  Diet effective now                   EDUCATION NEEDS:   Not appropriate for education at this time  Skin:  Skin Assessment: Reviewed RN  Assessment  Last BM:  1/28 - type 6  Height:   Ht Readings from Last 1 Encounters:  08/07/22 '6\' 1"'$  (1.854 m)    Weight:   Wt Readings from Last 1 Encounters:  08/18/22 103.7 kg    Ideal Body Weight:  83.6 kg  BMI:  Body mass index is 30.16 kg/m.  Estimated Nutritional Needs:  Kcal:  2000-2200 kcal/d Protein:  100-115g/d Fluid:  2-2.2L/d    Ranell Patrick, RD, LDN Clinical Dietitian RD pager # available in [facility]  After hours/weekend pager # available in [facility]

## 2022-08-19 ENCOUNTER — Inpatient Hospital Stay (HOSPITAL_COMMUNITY): Payer: Medicare Other

## 2022-08-19 DIAGNOSIS — I10 Essential (primary) hypertension: Secondary | ICD-10-CM | POA: Diagnosis not present

## 2022-08-19 DIAGNOSIS — I4729 Other ventricular tachycardia: Secondary | ICD-10-CM

## 2022-08-19 DIAGNOSIS — R7402 Elevation of levels of lactic acid dehydrogenase (LDH): Secondary | ICD-10-CM | POA: Diagnosis not present

## 2022-08-19 DIAGNOSIS — G9340 Encephalopathy, unspecified: Secondary | ICD-10-CM | POA: Diagnosis not present

## 2022-08-19 DIAGNOSIS — J101 Influenza due to other identified influenza virus with other respiratory manifestations: Secondary | ICD-10-CM | POA: Diagnosis not present

## 2022-08-19 HISTORY — PX: TRANSTHORACIC ECHOCARDIOGRAM: SHX275

## 2022-08-19 LAB — CBC
HCT: 47.3 % (ref 39.0–52.0)
Hemoglobin: 15 g/dL (ref 13.0–17.0)
MCH: 26.9 pg (ref 26.0–34.0)
MCHC: 31.7 g/dL (ref 30.0–36.0)
MCV: 84.8 fL (ref 80.0–100.0)
Platelets: 116 10*3/uL — ABNORMAL LOW (ref 150–400)
RBC: 5.58 MIL/uL (ref 4.22–5.81)
RDW: 17.2 % — ABNORMAL HIGH (ref 11.5–15.5)
WBC: 3.5 10*3/uL — ABNORMAL LOW (ref 4.0–10.5)
nRBC: 0 % (ref 0.0–0.2)

## 2022-08-19 LAB — ECHOCARDIOGRAM LIMITED
S' Lateral: 2.3 cm
Weight: 3640.24 oz

## 2022-08-19 LAB — MAGNESIUM: Magnesium: 1.8 mg/dL (ref 1.7–2.4)

## 2022-08-19 LAB — COMPREHENSIVE METABOLIC PANEL
ALT: 18 U/L (ref 0–44)
AST: 50 U/L — ABNORMAL HIGH (ref 15–41)
Albumin: 2.2 g/dL — ABNORMAL LOW (ref 3.5–5.0)
Alkaline Phosphatase: 114 U/L (ref 38–126)
Anion gap: 7 (ref 5–15)
BUN: 12 mg/dL (ref 8–23)
CO2: 22 mmol/L (ref 22–32)
Calcium: 8.4 mg/dL — ABNORMAL LOW (ref 8.9–10.3)
Chloride: 108 mmol/L (ref 98–111)
Creatinine, Ser: 1.2 mg/dL (ref 0.61–1.24)
GFR, Estimated: >60 mL/min (ref >60)
Glucose, Bld: 111 mg/dL — ABNORMAL HIGH (ref 70–99)
Potassium: 4.4 mmol/L (ref 3.5–5.1)
Sodium: 137 mmol/L (ref 135–145)
Total Bilirubin: 0.7 mg/dL (ref 0.3–1.2)
Total Protein: 7 g/dL (ref 6.5–8.1)

## 2022-08-19 MED ORDER — LACTATED RINGERS IV SOLN: INTRAVENOUS | Status: DC

## 2022-08-19 MED ORDER — LACTATED RINGERS IV SOLN: INTRAVENOUS | Status: AC

## 2022-08-19 NOTE — Plan of Care (Signed)
  Problem: Clinical Measurements: Goal: Respiratory complications will improve Outcome: Progressing   Problem: Activity: Goal: Risk for activity intolerance will decrease Outcome: Progressing   Problem: Safety: Goal: Ability to remain free from injury will improve Outcome: Progressing

## 2022-08-19 NOTE — TOC Progression Note (Signed)
Transition of Care [facility]) - Progression Note    Patient Details  Name: [name] MRN: [id number] Date of Birth: 09-15-1945  Transition of Care [facility]) CM/SW [place], LCSW Phone Number: 08/19/2022, 2:21 PM  Clinical Narrative:    CSW spoke with pt [facility] SW. Jackquline Denmark SW reported that she has completed a fl2 and been in communication with [facility] regarding bed for pt. [facility] SW communicated with CSW that [facility] requested to have updated information regarding pt current hospital stay.   CSW will fax out referral to [facility].   TOC will continue to follow.    Expected Discharge Plan: [place] Barriers to Discharge: Continued Medical Work up  Expected Discharge Plan and [place] Choice: [place] Living arrangements for the past 2 months: [place]:  (Is active with [facility])           Social Determinants of Health (SDOH) Interventions [place]: No Food Insecurity (07/10/2022)  Housing: Low Risk  (07/10/2022)  Transportation Needs: No Transportation Needs (07/10/2022)  Utilities: Not At Risk (07/05/2022)  Depression (PHQ2-9): Low Risk  (08/07/2022)  Tobacco Use: Medium Risk (08/16/2022)    Readmission Risk Interventions     No data to display        Beckey Rutter, MSW, LCSWA, LCASA Transitions of Care  Clinical Social Worker I

## 2022-08-19 NOTE — Progress Notes (Signed)
Echocardiogram 2D Echocardiogram has been performed.  [name] 08/19/2022, 12:17 PM

## 2022-08-19 NOTE — Progress Notes (Signed)
Progress Note   Patient: [name] EQA:[id number] DOB: [birth year] DOA: 08/16/2022     2 DOS: the patient was seen and examined on 08/19/2022   Brief hospital course: 77 y.o. male with medical history significant of ETOH dependence in  remission; BPH; prostate CA; chronic back pain; DM; COPD; HLD; HTN; transient afib; SDH (03/2022); recent PE on Eliquis; and CVA presenting with AMS.  His daughter reports that he was clearly altered today, was in the bathroom unable to pull on his clothes.  He was clearly altered.  This previously happened with UTI.  Since his head bleed, she has noticed progressive cognitive impairment.  +cough, thought it was related to his COPD.   Assessment and Plan: Influenza A -Supportive care -Continue Tamiflu BID -Lactic acidosis is thought to be related to this issue -Seen by PT/OT with recs thus far for SNF, TOC following   Recent B PE -Hospitalized about 1 month ago (12/15-19) -Continue eliquis, no evidence of acute blood loss   Acute metabolic encephalopathy with underlying apparent cognitive impairment -Patient with SDH after a fall on 9/15 -Daughter has noticed mild progressive cognitive impairment since -UA reviewed, not suggestive of UTI -Passed dysphagia 3 diet, consulted dietitian to eval nutritional status -Head CT reviewed. No acute intracranial abnormality with old L parietoccipital infarct, severe chronic small vessel disease, moderate cerebral atrophy -Now more oriented after IVF   NSVT -Recurrent episode this AM -Keep K>4, Mag>2 -Increased Coreg to 12.'5mg'$  bid -Cont to monitor on tele -Ordered and reviewed repeat limited echo. EF 70-75% with severe LA dilation, moderate RA dilation, mildly dilated aortic root. No significant change from prior study   DM -Last A1c was 7.0, reasonable control -Not on home meds -Cover with moderate-scale SSI  -glycemic trends stable   COPD -No audible wheezing -on minimal O2 currently     HTN -Continue carvedilol -BP stable   HLD -Continue rusuvastatin   BPH -Continue tamsulosin   Chronic diastolic CHF with lymphedema -Pt with chronic LE lymphedema which is reportedly better than usual, per daughter -Concerns of dehydration with lasix still on hold -despite being 2kg higher since admit after gentle IVF, pt still appears with dry mucus membranes, increased thirst, concentrated appearing urine. Pt not eating well.  -Will resume LR at 75cc/hr x another 6hrs and reassess in AM   Afib -Rate controlled with Coreg -Continue Eliquis  Hypomagnesemia -Now corrected      Subjective: Feeling thirsty today. More alert this AM  Physical Exam: Vitals:   08/19/22 0025 08/19/22 0239 08/19/22 0315 08/19/22 1037  BP: (!) 143/73  (!) 158/81 (!) 142/67  Pulse:   70 (!) 58  Resp:   18 17  Temp:   98.2 F (36.8 C) 98.2 F (36.8 C)  TempSrc:   Oral Oral  SpO2:   99% 99%  Weight:  103.2 kg     General exam: Awake, laying in bed, in nad, dry mucus membranes Respiratory system: Normal respiratory effort, no wheezing Cardiovascular system: regular rate, s1, s2 Gastrointestinal system: Soft, nondistended, positive BS Central nervous system: CN2-12 grossly intact, strength intact Extremities: Perfused, no clubbing Skin: Normal skin turgor, no notable skin lesions seen Psychiatry: Mood normal // no visual hallucinations   Data Reviewed:  Labs reviewed: Na 137, K 4.4, Cr 1.20  Family Communication: Pt in room, family not at bedside  Disposition: Status is: Inpatient Continue inpatient stay because: Severity of illness  Planned Discharge Destination: Skilled nursing facility    Author: Marylu Lund, MD  08/19/2022 2:54 PM  For on call review [email or web address].

## 2022-08-19 NOTE — Progress Notes (Signed)
Physical Therapy Treatment Patient Details Name: [name] MRN: [id number] DOB: 26-Mar-1946 Today's Date: 08/19/2022   History of Present Illness Pt is a 77 y.o. M who presens 08/16/2022 with AMS. CT head negative, positive for influenza. Significant PMH:  ETOH dependence in remission, BPH, prostate CA, chronic back pain, DM, COPD, HLD, HTN, transient a-fib, SDH (03/2022), recent PE, CVA.    PT Comments    Pt with continued progress towards acute goals, however continues to be limited by impaired cognition, weakness, decreased coordination and impaired balance/postural reactions. Pt able to come to sitting EOB with up to mod assist, pt with good initiation of bed mobility this date however pt continues to have noted retropulsion in sitting needing cues and assist for anterior weight shift and finding midline sitting. Pt able to come to stand with mod a in stedy frame and maintain standing with min assist ~60 seconds x3 trials. Pt with decreased insight into deficits, stating throughout session that he was able to do things on won and reluctant to accept assistance to complete mobility. Current plan remains appropriate to address deficits and maximize functional independence and decrease caregiver burden. Pt continues to benefit from skilled PT services to progress toward functional mobility goals.     Recommendations for follow up therapy are one component of a multi-disciplinary discharge planning process, led by the attending physician.  Recommendations may be updated based on patient status, additional functional criteria and insurance authorization.  Follow Up Recommendations  Skilled nursing-short term rehab (<3 hours/day) Can patient physically be transported by private vehicle: No   Assistance Recommended at Discharge Frequent or constant Supervision/Assistance  Patient can return home with the following Two people to help with walking and/or transfers;A lot of help with  bathing/dressing/bathroom   Equipment Recommendations  Other (comment) (defer)    Recommendations for Other Services       Precautions / Restrictions Precautions Precautions: Fall Restrictions Weight Bearing Restrictions: No     Mobility  Bed Mobility Overal bed mobility: Needs Assistance Bed Mobility: Supine to Sit     Supine to sit: Min assist, Mod assist     General bed mobility comments: pt declining assist at start stating "i can do it myself" however once initiated pt unable to complete without assist, posterior lean and some retropulsion needing max cues to maintain midline    Transfers Overall transfer level: Needs assistance Equipment used: Ambulation equipment used Transfers: Bed to chair/wheelchair/BSC, Sit to/from Stand Sit to Stand: Mod assist, Min assist           General transfer comment: mod a to initiate stand as pt with retrolpulsion intitally, min a to reise from stedy pads x2 Transfer via Lift Equipment: Stedy  Ambulation/Gait             Pre-gait activities: standing marching in stedy frame x10     Stairs             Wheelchair Mobility    Modified Rankin (Stroke Patients Only)       Balance Overall balance assessment: Needs assistance Sitting-balance support: Feet supported Sitting balance-Leahy Scale: Poor Sitting balance - Comments: min A at best to sit EOB, fatigues, leans posteriorly quickly   Standing balance support: During functional activity, Reliant on assistive device for balance Standing balance-Leahy Scale: Poor Standing balance comment: reliant on external support  Cognition Arousal/Alertness: Awake/alert Behavior During Therapy: Flat affect, Agitated Overall Cognitive Status: Impaired/Different from baseline Area of Impairment: Orientation, Memory, Following commands, Safety/judgement, Awareness, Problem solving, Attention                 Orientation  Level: Disoriented to, Time (able to state cone and that situation as "people keep thinking I am disoriented") Current Attention Level: Sustained Memory: Decreased short-term memory Following Commands: Follows one step commands with increased time Safety/Judgement: Decreased awareness of safety, Decreased awareness of deficits Awareness: Intellectual Problem Solving: Slow processing, Difficulty sequencing, Requires verbal cues General Comments: able to follow commands with increased time, signifigantly decreased insight into deficits and deccreased safety awareness. pt intermittently aggigtated        Exercises Other Exercises Other Exercises: reaching outside BOS in sitting x20 with emphasis on anterior reaching for proprioceptive awarness    General Comments General comments (skin integrity, edema, etc.): VSS on RA      Pertinent Vitals/Pain Pain Assessment Pain Assessment: No/denies pain Pain Intervention(s): Monitored during session    Home Living                          Prior Function            PT Goals (current goals can now be found in the care plan section) Acute Rehab PT Goals PT Goal Formulation: With patient/family Time For Goal Achievement: 08/31/22 Progress towards PT goals: Progressing toward goals    Frequency    Min 2X/week      PT Plan      Co-evaluation              AM-PAC PT "6 Clicks" Mobility   Outcome Measure  Help needed turning from your back to your side while in a flat bed without using bedrails?: A Lot Help needed moving from lying on your back to sitting on the side of a flat bed without using bedrails?: A Lot Help needed moving to and from a bed to a chair (including a wheelchair)?: Total Help needed standing up from a chair using your arms (e.g., wheelchair or bedside chair)?: Total Help needed to walk in hospital room?: Total Help needed climbing 3-5 steps with a railing? : Total 6 Click Score: 8    End of  Session   Activity Tolerance: Patient tolerated treatment well Patient left: in chair;with call bell/phone within reach;with chair alarm set Nurse Communication: Mobility status;Need for lift equipment (stedy for back to bed) PT Visit Diagnosis: Muscle weakness (generalized) (M62.81);Other abnormalities of gait and mobility (R26.89)     Time: 8115-7262 PT Time Calculation (min) (ACUTE ONLY): 29 min  Charges:  $Therapeutic Activity: 23-37 mins                     Alivia Cimino R. PTA Acute Rehabilitation Services Office: [place] 08/19/2022, 1:28 PM

## 2022-08-19 NOTE — Discharge Instructions (Signed)

## 2022-08-19 NOTE — NC FL2 (Signed)
[place] MEDICAID FL2 LEVEL OF CARE FORM     IDENTIFICATION  Patient Name: [name] Birthdate: Jun 04, 1946 Sex: male Admission Date (Current Location): 08/16/2022  [facility] and [place] Number:  Herbalist and Address:  [facility] [employer], [place] [address] [place], [place] [address]      Provider Number: [id number]  Attending Physician Name and Address:  Donne Hazel, MD  Relative Name and Phone Number:  Campillo,Krista Daughter [phone]    Current Level of Care: Hospital Recommended Level of Care: [place] Prior Approval Number:    Date Approved/Denied:   PASRR Number: [id number]  Discharge Plan: SNF    Current Diagnoses: Patient Active Problem List   Diagnosis Date Noted   Influenza A 08/16/2022   History of pulmonary embolism 46/27/0350   Acute metabolic encephalopathy 09/38/1829   MCI (mild cognitive impairment) 08/16/2022   Influenza 08/16/2022   Hypokalemia 08/07/2022   Physical deconditioning 08/07/2022   Bilateral pulmonary embolism ([place]) 07/05/2022   UTI (urinary tract infection) 07/04/2022   Urinary tract infection without hematuria 06/28/2022   Confusion 06/28/2022   Hypercoagulable state due to longstanding persistent atrial fibrillation ([place]) 06/08/2022   Foul smelling urine 04/30/2022   Nausea and vomiting 04/30/2022   SDH (subdural hematoma) (HCC) 04/17/2022   Subdural hematoma (HCC) 04/04/2022   New daily persistent headache 04/02/2022   COVID-19 01/26/2022   Hypomagnesemia 01/25/2022   CVA (cerebral vascular accident) ([place]) 01/24/2022   Cough 01/24/2022   Elevated troponin 01/24/2022   Fever 01/24/2022   Bradycardia 11/21/2021   Vitamin D deficiency 11/21/2021   Other fatigue 11/18/2021   Bilateral leg edema 07/08/2021   Gait disorder 12/16/2020   HTN (hypertension) 12/16/2020   Aortic atherosclerosis ([place]) 12/12/2020   Diverticulitis 10/23/2020   Fall 10/22/2020   Sepsis  ([place]) 10/22/2020   Acute lower UTI 10/22/2020   NSVT (nonsustained ventricular tachycardia) (HCC)    Multiple myeloma ([place])    AKI (acute kidney injury) ([place]) 02/15/2020   General weakness    Diabetic leg ulcer ([place]) 10/06/2019   Chronic kidney disease, stage 3b ([place]) 10/06/2019   Diabetic ulcer of right lower leg ([place]) 02/23/2019   Abnormal LFTs 04/27/2018   Open wound of scrotum 03/01/2018   Acute on chronic diastolic CHF (congestive heart failure) ([place]) 02/25/2018   Postoperative anemia due to acute blood loss 02/25/2018   Occipital stroke ([place]), left 09/23/2017   Diabetes mellitus type II, non insulin dependent ([place])    Encounter for well adult exam with abnormal findings 07/31/2017   Venous insufficiency (chronic) (peripheral) with chronic lower extremity edema 04/04/2016   Junctional tachycardia 03/18/2015   Exertional dyspnea 07/05/2014   Obesity (BMI 30-39.9) 05/04/2014   Benign essential tremor 05/04/2014   Peripheral neuropathy 12/21/2013   Nonspecific abnormal finding in stool contents 09/06/2012   Heme positive stool 08/09/2012   Difficulty passing stool 08/09/2012   Sebaceous cyst 04/15/2012   Complex renal cyst 01/13/2011   Back pain 01/02/2011   Permanent atrial fibrillation ([place]) - now off amiodarone; asymptomatic. CHA2DS2-VASc Score -5 (on Xarelto) 01/02/2011   KNEE PAIN, LEFT 08/27/2009   COLONIC POLYPS, HX OF 08/08/2008   ERECTILE DYSFUNCTION 09/27/2007   ULNAR NEUROPATHY 09/27/2007   VENTRICULAR HYPERTROPHY, LEFT 09/27/2007   ALLERGIC RHINITIS 09/27/2007   Arthropathy 09/27/2007   GANGLION CYST, WRIST, LEFT 09/27/2007   HEMORRHOIDS, INTERNAL 06/10/2007   DIVERTICULOSIS, COLON 06/10/2007   DISC DISEASE, LUMBAR 06/03/2007   NEPHROLITHIASIS, HX OF 06/03/2007  Diabetes mellitus with neuropathy ([place]) 02/11/2007   Hyperlipidemia with target LDL less than 100 02/11/2007   ANXIETY 02/11/2007   Nondependent Alcohol Abuse, in Remission 02/11/2007   Hypertensive  heart disease with chronic diastolic congestive heart failure ([place]) 02/11/2007   COPD (chronic obstructive pulmonary disease) ([place]) 02/11/2007   BPH (benign prostatic hyperplasia) 02/11/2007   LOW BACK PAIN 02/11/2007   Disturbance of skin sensation 02/11/2007   PROSTATE CANCER, HX OF 02/11/2007    Orientation RESPIRATION BLADDER Height & Weight     Place, Self, Situation  Normal External catheter, Incontinent Weight: 227 lb 8.2 oz (103.2 kg) Height:     BEHAVIORAL SYMPTOMS/MOOD NEUROLOGICAL BOWEL NUTRITION STATUS      Incontinent Diet (see d/c summary)  AMBULATORY STATUS COMMUNICATION OF NEEDS Skin   Extensive Assist Verbally Normal                       Personal Care Assistance Level of Assistance  Bathing, Feeding, Dressing Bathing Assistance: Maximum assistance Feeding assistance: Limited assistance Dressing Assistance: Maximum assistance     Functional Limitations Info  Sight, Hearing, Speech Sight Info: Adequate Hearing Info: Adequate Speech Info: Adequate    SPECIAL CARE FACTORS FREQUENCY  PT (By licensed PT), OT (By licensed OT)     PT Frequency: 5x/week OT Frequency: 5x/wk            Contractures Contractures Info: Not present    Additional Factors Info  Code Status Code Status Info: Full             Current Medications (08/19/2022):  This is the current hospital active medication list Current Facility-Administered Medications  Medication Dose Route Frequency Provider Last Rate Last Admin   acetaminophen (TYLENOL) tablet 650 mg  650 mg Oral Q6H PRN Karmen Bongo, MD   650 mg at 08/17/22 1610   Or   acetaminophen (TYLENOL) suppository 650 mg  650 mg Rectal Q6H PRN Karmen Bongo, MD   650 mg at 08/17/22 0013   albuterol (PROVENTIL) (2.5 MG/3ML) 0.083% nebulizer solution 2.5 mg  2.5 mg Nebulization Q2H PRN Karmen Bongo, MD   2.5 mg at 08/16/22 1728   apixaban (ELIQUIS) tablet 5 mg  5 mg Oral BID Donne Hazel, MD   5 mg at 08/18/22 2045    bisacodyl (DULCOLAX) EC tablet 5 mg  5 mg Oral Daily PRN Karmen Bongo, MD       carvedilol (COREG) tablet 12.5 mg  12.5 mg Oral BID WC Donne Hazel, MD   12.5 mg at 08/19/22 0844   dextrose 50 % solution 50 mL  1 ampule Intravenous Q1H PRN Howerter, Justin B, DO       docusate sodium (COLACE) capsule 100 mg  100 mg Oral BID Karmen Bongo, MD   100 mg at 08/19/22 0844   feeding supplement (ENSURE ENLIVE / ENSURE PLUS) liquid 237 mL  237 mL Oral TID BM Donne Hazel, MD   237 mL at 08/19/22 0845   hydrALAZINE (APRESOLINE) injection 5 mg  5 mg Intravenous Q4H PRN Karmen Bongo, MD       multivitamin with minerals tablet 1 tablet  1 tablet Oral Daily Donne Hazel, MD   1 tablet at 08/19/22 0844   ondansetron [facility]) tablet 4 mg  4 mg Oral Q6H PRN Karmen Bongo, MD       Or   ondansetron [facility]) injection 4 mg  4 mg Intravenous Q6H PRN Karmen Bongo, MD  oseltamivir (TAMIFLU) capsule 75 mg  75 mg Oral BID Karmen Bongo, MD   75 mg at 08/19/22 0109   oxyCODONE (Oxy IR/ROXICODONE) immediate release tablet 5 mg  5 mg Oral Q4H PRN Karmen Bongo, MD       polyethylene glycol (MIRALAX / GLYCOLAX) packet 17 g  17 g Oral Daily PRN Karmen Bongo, MD       rosuvastatin (CRESTOR) tablet 20 mg  20 mg Oral QPM Karmen Bongo, MD   20 mg at 08/18/22 1642   sodium chloride flush (NS) 0.9 % injection 3 mL  3 mL Intravenous Lillia Mountain, MD   3 mL at 08/19/22 0844   tamsulosin (FLOMAX) capsule 0.4 mg  0.4 mg Oral QPC supper Karmen Bongo, MD   0.4 mg at 08/18/22 1642     Discharge Medications: Please see discharge summary for a list of discharge medications.  Relevant Imaging Results:  Relevant Lab Results:   Additional Information SS#: [id number]  Jinger Neighbors, LCSW

## 2022-08-19 NOTE — TOC Progression Note (Signed)
Transition of Care [facility]) - Progression Note    Patient Details  Name: [name] MRN: [id number] Date of Birth: 10/01/1945  Transition [facility]) CM/SW Contact  Jinger Neighbors, [place] Phone Number: 08/19/2022, 11:31 AM  Clinical Narrative:     CSW completed FL2 and fax out.   Expected Discharge Plan: [place] Barriers to Discharge: Continued Medical Work up  Expected Discharge Plan and [employer] Choice: [place] Living arrangements for the past 2 months: [place]:  (Is active with [facility])           Social Determinants of Health (SDOH) Interventions SDOH Screenings   Food Insecurity: No Food Insecurity (07/10/2022)  Housing: Low Risk  (07/10/2022)  Transportation Needs: No Transportation Needs (07/10/2022)  Utilities: Not At Risk (07/05/2022)  Depression (PHQ2-9): Low Risk  (08/07/2022)  Tobacco Use: Medium Risk (08/16/2022)    Readmission Risk Interventions     No data to display

## 2022-08-20 DIAGNOSIS — I4729 Other ventricular tachycardia: Secondary | ICD-10-CM | POA: Diagnosis not present

## 2022-08-20 DIAGNOSIS — J101 Influenza due to other identified influenza virus with other respiratory manifestations: Secondary | ICD-10-CM | POA: Diagnosis not present

## 2022-08-20 LAB — COMPREHENSIVE METABOLIC PANEL
ALT: 20 U/L (ref 0–44)
AST: 50 U/L — ABNORMAL HIGH (ref 15–41)
Albumin: 2.1 g/dL — ABNORMAL LOW (ref 3.5–5.0)
Alkaline Phosphatase: 110 U/L (ref 38–126)
Anion gap: 8 (ref 5–15)
BUN: 12 mg/dL (ref 8–23)
CO2: 21 mmol/L — ABNORMAL LOW (ref 22–32)
Calcium: 8.1 mg/dL — ABNORMAL LOW (ref 8.9–10.3)
Chloride: 107 mmol/L (ref 98–111)
Creatinine, Ser: 1.25 mg/dL — ABNORMAL HIGH (ref 0.61–1.24)
GFR, Estimated: >60 mL/min (ref >60)
Glucose, Bld: 101 mg/dL — ABNORMAL HIGH (ref 70–99)
Potassium: 4 mmol/L (ref 3.5–5.1)
Sodium: 136 mmol/L (ref 135–145)
Total Bilirubin: 1 mg/dL (ref 0.3–1.2)
Total Protein: 6.6 g/dL (ref 6.5–8.1)

## 2022-08-20 LAB — CBC
HCT: 41.3 % (ref 39.0–52.0)
Hemoglobin: 13.2 g/dL (ref 13.0–17.0)
MCH: 27.2 pg (ref 26.0–34.0)
MCHC: 32 g/dL (ref 30.0–36.0)
MCV: 85.2 fL (ref 80.0–100.0)
Platelets: 132 10*3/uL — ABNORMAL LOW (ref 150–400)
RBC: 4.85 MIL/uL (ref 4.22–5.81)
RDW: 16.7 % — ABNORMAL HIGH (ref 11.5–15.5)
WBC: 5.2 10*3/uL (ref 4.0–10.5)
nRBC: 0 % (ref 0.0–0.2)

## 2022-08-20 LAB — MAGNESIUM: Magnesium: 1.8 mg/dL (ref 1.7–2.4)

## 2022-08-20 MED ORDER — MAGNESIUM OXIDE -MG SUPPLEMENT 400 (240 MG) MG PO TABS
400.0000 mg | ORAL_TABLET | Freq: Two times a day (BID) | ORAL | Status: DC
  Administered 2022-08-21 – 2022-08-23 (×5): 400 mg via ORAL
  Filled 2022-08-20 (×5): qty 1

## 2022-08-20 MED ORDER — GUAIFENESIN 100 MG/5ML PO LIQD: 5.0000 mL | ORAL | Status: DC | PRN

## 2022-08-20 MED ORDER — GUAIFENESIN ER 600 MG PO TB12
600.0000 mg | ORAL_TABLET | Freq: Two times a day (BID) | ORAL | Status: DC
  Administered 2022-08-20 – 2022-08-23 (×7): 600 mg via ORAL
  Filled 2022-08-20 (×7): qty 1

## 2022-08-20 MED ORDER — MAGNESIUM SULFATE 2 GM/50ML IV SOLN
2.0000 g | Freq: Once | INTRAVENOUS | Status: AC
  Administered 2022-08-20: 2 g via INTRAVENOUS
  Filled 2022-08-20: qty 50

## 2022-08-20 MED ORDER — POTASSIUM CHLORIDE CRYS ER 20 MEQ PO TBCR
20.0000 meq | EXTENDED_RELEASE_TABLET | Freq: Once | ORAL | Status: AC
  Administered 2022-08-20: 20 meq via ORAL
  Filled 2022-08-20: qty 1

## 2022-08-20 NOTE — Progress Notes (Signed)
Pt. Had 37 beats of VTach while asleep. Pt. Denies any discomfort. On call for [facility] paged to make aware. VSS. Blood pressure (!) 118/91, pulse 67, temperature 98.3 F (36.8 C), temperature source Oral, resp. rate (!) 22, weight 103.2 kg, SpO2 93 %.

## 2022-08-20 NOTE — TOC Progression Note (Signed)
Transition of Care [facility]) - Progression Note    Patient Details  Name: Emmanual Gauthreaux MRN: [id number] Date of Birth: 07/18/1946  Transition of Care [facility]) CM/SW [place], LCSW Phone Number: 08/20/2022, 4:14 PM  Clinical Narrative:    [facility] informed CSW that they are unable to extend a bed offer to pt at this time. CSW communicated this with pt daughter [name]. CSW informed daughter on bed offers. Daughter requested [facility], as pt has previously gone there for STR.   CSW reached out to [facility] regarding bed offer. [facility] stated they can admit pt to SNF once he is off flu isolation (7 days from 1/27). CSW communicated this information with MD and daughter. TOC will continue to follow.    Expected Discharge Plan: [place] Barriers to Discharge: Continued Medical Work up  Expected Discharge Plan and [place] Choice: [place] Living arrangements for the past 2 months: [place]:  (Is active with [facility])           Social Determinants of Health (SDOH) Interventions [place]: No Food Insecurity (07/10/2022)  Housing: Low Risk  (07/10/2022)  Transportation Needs: No Transportation Needs (07/10/2022)  Utilities: Not At Risk (07/05/2022)  Depression (PHQ2-9): Low Risk  (08/07/2022)  Tobacco Use: Medium Risk (08/16/2022)    Readmission Risk Interventions     No data to display        Beckey Rutter, MSW, LCSWA, LCASA Transitions of Care  Clinical Social Worker I

## 2022-08-20 NOTE — Progress Notes (Signed)
SLP Cancellation Note  Patient Details Name: Palmer Fahrner MRN: [id number] DOB: 04/24/1946   Cancelled treatment:       Reason Eval/Treat Not Completed: Patient declined, no reason specified. Patient sitting up in bed, awake and alert with lunch meal tray in front of him, completely untouched. Patient declined all PO's offered and appeared somewhat irritated/suspicious of SLP's concern with his swallow function. SLP will attempt another date.   Sonia Baller, MA, CCC-SLP Speech Therapy

## 2022-08-20 NOTE — Progress Notes (Signed)
[facility] PROGRESS NOTE   Olsen Greenstreet QIW:[id number] DOB: 1946-01-15 DOA: 08/16/2022  PCP: Biagio Borg, MD  Brief History/Interval Summary: 77 y.o. male with medical history significant of ETOH dependence in  remission; BPH; prostate CA; chronic back pain; DM; COPD; HLD; HTN; transient afib; SDH (03/2022); recent PE on Eliquis; and CVA presenting with AMS.   Patient tested positive for influenza.  Hospitalized for further management.  Consultants: None  Procedures: None    Subjective/Interval History: Patient continues to have a cough.  Complains of foot pain in the bottom of his right foot.  Denies any recent injuries.    Assessment/Plan:  Influenza A Patient treated with Tamiflu.  To complete course tomorrow.  Continues to have a cough.  Will give Mucinex.  Incentive spirometry.  Mobilize as much as possible.     History of  Bilateral pulmonary embolism  -Hospitalized about 1 month ago (12/15-19) -Continue eliquis, no evidence of acute blood loss   Acute metabolic encephalopathy with underlying apparent cognitive impairment -Patient with SDH after a fall on 9/15 -Daughter has noticed mild progressive cognitive impairment since -UA reviewed, not suggestive of UTI On Dysphagia 3 diet -Head CT reviewed. No acute intracranial abnormality with old L parietoccipital infarct, severe chronic small vessel disease, moderate cerebral atrophy -Now more oriented after IVF   NSVT Patient with recurrent episodes of nonsustained ventricular tachycardia.  Recent echocardiogram showed EF to be 70 to 75%.  Patient was placed on carvedilol dose was increased recently.  Monitor electrolytes.  Keep potassium greater than 4 and magnesium greater than 2. Will supplement potassium and magnesium today.   Diabetes mellitus type II  -Last A1c was 7.0, reasonable control -Not on home meds -Cover with moderate-scale SSI  -glycemic trends stable   COPD Seems to be  stable.  Essential hypertension  -Continue carvedilol -BP stable   HLD -Continue rusuvastatin   BPH -Continue tamsulosin   Chronic diastolic CHF with lymphedema Had to be given IV fluids due to concern for hypovolemia during this admission.  Furosemide currently on hold.  Should be able to resume at discharge.  Currently off of IV fluids.  Right foot pain Possibly due to neuropathy.  Has good pulses.    Chronic atrial fibrillation -Rate controlled with Coreg -Continue Eliquis   Hypomagnesemia Will supplement   DVT Prophylaxis: On Eliquis Code Status: Full code Family Communication: Discussed with patient.  No family at bedside Disposition Plan: SNF when medically stable  Status is: Inpatient Remains inpatient appropriate because: Influenza      Medications: Scheduled:  apixaban  5 mg Oral BID   carvedilol  12.5 mg Oral BID WC   feeding supplement  237 mL Oral TID BM   multivitamin with minerals  1 tablet Oral Daily   oseltamivir  75 mg Oral BID   rosuvastatin  20 mg Oral QPM   sodium chloride flush  3 mL Intravenous Q12H   tamsulosin  0.4 mg Oral QPC supper   Continuous:  magnesium sulfate bolus IVPB     ERD:EYCXKGYJEHUDJ **OR** acetaminophen, albuterol, bisacodyl, dextrose, hydrALAZINE, ondansetron **OR** ondansetron (ZOFRAN) IV, oxyCODONE, polyethylene glycol  Antibiotics: Anti-infectives (From admission, onward)    Start     Dose/Rate Route Frequency Ordered Stop   08/16/22 1800  oseltamivir (TAMIFLU) capsule 75 mg        75 mg Oral 2 times daily 08/16/22 1643 08/21/22 2159       Objective:  Vital Signs  Vitals:   08/19/22 1718  08/19/22 2025 08/20/22 0034 08/20/22 0415  BP: (!) 165/113 134/84 (!) 118/91 135/71  Pulse:  66 67 68  Resp:  18 (!) 22 18  Temp:  98.2 F (36.8 C) 98.3 F (36.8 C) 98.2 F (36.8 C)  TempSrc:  Oral Oral Oral  SpO2:  98% 93% 94%  Weight:    102 kg    Intake/Output Summary (Last 24 hours) at 08/20/2022 1003 Last  data filed at 08/20/2022 0845 Gross per 24 hour  Intake 580 ml  Output 900 ml  Net -320 ml   Filed Weights   08/18/22 0016 08/19/22 0239 08/20/22 0415  Weight: 103.7 kg 103.2 kg 102 kg    General appearance: Awake alert.  In no distress Resp: Normal effort at rest.  Coarse breath sounds bilaterally.  No wheezing or rhonchi. Cardio: S1-S2 is normal regular.  No S3-S4.  No rubs murmurs or bruit GI: Abdomen is soft.  Nontender nondistended.  Bowel sounds are present normal.  No masses organomegaly Extremities: No edema.  Limited range of motion lateral lower extremities.  Chronic skin changes noted in the right lower extremity.  Good pedal pulses in the right foot.  No obvious wounds identified. Neurologic: No focal neurological deficits noted   Lab Results:  Data Reviewed: I have personally reviewed following labs and reports of the imaging studies  CBC: Recent Labs  Lab 08/16/22 1228 08/17/22 0044 08/18/22 0055 08/19/22 0136 08/20/22 0539  WBC 6.7 6.6 3.9* 3.5* 5.2  NEUTROABS 5.2  --   --   --   --   HGB 12.9* 12.9* 13.6 15.0 13.2  HCT 40.2 40.0 43.5 47.3 41.3  MCV 86.3 84.2 85.3 84.8 85.2  PLT 188 174 178 116* 132*    Basic Metabolic Panel: Recent Labs  Lab 08/16/22 1344 08/16/22 2110 08/17/22 0044 08/18/22 0055 08/19/22 0136 08/20/22 0539  NA 132*  --  136 135 137 136  K 5.3*  --  3.7 3.6 4.4 4.0  CL 103  --  105 104 108 107  CO2 20*  --  25 21* 22 21*  GLUCOSE 95  --  109* 117* 111* 101*  BUN 12  --  '11 12 12 12  '$ CREATININE 1.24  --  1.40* 1.30* 1.20 1.25*  CALCIUM 7.7*  --  8.4* 8.5* 8.4* 8.1*  MG  --  1.3* 1.3* 2.2 1.8 1.8    GFR: Estimated Creatinine Clearance: 64.1 mL/min (A) (by C-G formula based on SCr of 1.25 mg/dL (H)).  Liver Function Tests: Recent Labs  Lab 08/16/22 1344 08/18/22 0055 08/19/22 0136 08/20/22 0539  AST 32 43* 50* 50*  ALT '12 15 18 20  '$ ALKPHOS 122 122 114 110  BILITOT 0.8 0.5 0.7 1.0  PROT 6.8 7.6 7.0 6.6  ALBUMIN 2.4*  2.4* 2.2* 2.1*     Coagulation Profile: Recent Labs  Lab 08/16/22 1147  INR 1.5*      CBG: Recent Labs  Lab 08/17/22 0026 08/17/22 0427 08/17/22 0810 08/17/22 1101 08/17/22 1536  GLUCAP 85 117* 136* 129* 108*     Recent Results (from the past 240 hour(s))  Resp panel by RT-PCR (RSV, Flu A&B, Covid) Anterior Nasal Swab     Status: Abnormal   Collection Time: 08/16/22  2:15 PM   Specimen: Anterior Nasal Swab  Result Value Ref Range Status   SARS Coronavirus 2 by RT PCR NEGATIVE NEGATIVE Final    Comment: (NOTE) SARS-CoV-2 target nucleic acids are NOT DETECTED.  The SARS-CoV-2 RNA  is generally detectable in upper respiratory specimens during the acute phase of infection. The lowest concentration of SARS-CoV-2 viral copies this assay can detect is 138 copies/mL. A negative result does not preclude SARS-Cov-2 infection and should not be used as the sole basis for treatment or other patient management decisions. A negative result may occur with  improper specimen collection/handling, submission of specimen other than nasopharyngeal swab, presence of viral mutation(s) within the areas targeted by this assay, and inadequate number of viral copies(<138 copies/mL). A negative result must be combined with clinical observations, patient history, and epidemiological information. The expected result is Negative.  Fact Sheet for Patients:  EntrepreneurPulse.com.au  Fact Sheet for Healthcare Providers:  IncredibleEmployment.be  This test is no t yet approved or cleared by the [place] FDA and  has been authorized for detection and/or diagnosis of SARS-CoV-2 by FDA under an Emergency Use Authorization (EUA). This EUA will remain  in effect (meaning this test can be used) for the duration of the COVID-19 declaration under Section 564(b)(1) of the Act, 21 U.S.C.section 360bbb-3(b)(1), unless the authorization is terminated  or revoked  sooner.       Influenza A by PCR POSITIVE (A) NEGATIVE Final   Influenza B by PCR NEGATIVE NEGATIVE Final    Comment: (NOTE) The Xpert Xpress SARS-CoV-2/FLU/RSV plus assay is intended as an aid in the diagnosis of influenza from Nasopharyngeal swab specimens and should not be used as a sole basis for treatment. Nasal washings and aspirates are unacceptable for Xpert Xpress SARS-CoV-2/FLU/RSV testing.  Fact Sheet for Patients: EntrepreneurPulse.com.au  Fact Sheet for Healthcare Providers: IncredibleEmployment.be  This test is not yet approved or cleared by the [place] FDA and has been authorized for detection and/or diagnosis of SARS-CoV-2 by FDA under an Emergency Use Authorization (EUA). This EUA will remain in effect (meaning this test can be used) for the duration of the COVID-19 declaration under Section 564(b)(1) of the Act, 21 U.S.C. section 360bbb-3(b)(1), unless the authorization is terminated or revoked.     Resp Syncytial Virus by PCR NEGATIVE NEGATIVE Final    Comment: (NOTE) Fact Sheet for Patients: EntrepreneurPulse.com.au  Fact Sheet for Healthcare Providers: IncredibleEmployment.be  This test is not yet approved or cleared by the [place] FDA and has been authorized for detection and/or diagnosis of SARS-CoV-2 by FDA under an Emergency Use Authorization (EUA). This EUA will remain in effect (meaning this test can be used) for the duration of the COVID-19 declaration under Section 564(b)(1) of the Act, 21 U.S.C. section 360bbb-3(b)(1), unless the authorization is terminated or revoked.  Performed at [facility], [place] [address], [place], [place] [address]   Urine Culture (for pregnant, neutropenic or urologic patients or patients with an indwelling urinary catheter)     Status: None   Collection Time: 08/16/22  3:23 PM   Specimen: In/Out Cath Urine  Result Value Ref  Range Status   Specimen Description IN/OUT CATH URINE  Final   Special Requests NONE  Final   Culture   Final    NO GROWTH Performed at [facility], [place] [address], [place], [place] [address]    Report Status 08/18/2022 FINAL  Final      Radiology Studies: ECHOCARDIOGRAM LIMITED  Result Date: 08/19/2022    ECHOCARDIOGRAM LIMITED REPORT   Patient Name:   [name] Date of Exam: 08/19/2022 Medical Rec #:  [id number]           Height:  73.0 in Accession #:    [id number]          Weight:       227.5 lb Date of Birth:  Jan 21, 1946           BSA:          2.273 m Patient Age:    67 years            BP:           142/67 mmHg Patient Gender: M                   HR:           66 bpm. Exam Location:  Inpatient Procedure: Limited Echo and Limited Color Doppler Indications:    Ventricular Tachycardia I47.2  History:        Patient has prior history of Echocardiogram examinations, most                 recent 07/06/2022. COPD and Stroke, Arrythmias:Atrial                 Fibrillation and Tachycardia; Risk Factors:Hypertension,                 Diabetes and Dyslipidemia.  Sonographer:    Ronny Flurry Referring Phys: Klemme  1. Left ventricular ejection fraction, by estimation, is 70 to 75%. The left ventricle has hyperdynamic function. The left ventricle has no regional wall motion abnormalities. There is moderate concentric left ventricular hypertrophy.  2. Right ventricular systolic function is normal. The right ventricular size is normal. Tricuspid regurgitation signal is inadequate for assessing PA pressure.  3. Left atrial size was severely dilated.  4. Right atrial size was moderately dilated.  5. The mitral valve is grossly normal. No evidence of mitral valve regurgitation. No evidence of mitral stenosis.  6. Aortic dilatation noted. There is mild dilatation of the aortic root, measuring 41 mm.  7. The inferior vena cava is dilated in size with >50% respiratory  variability, suggesting right atrial pressure of 8 mmHg. Comparison(s): No significant change from prior study. FINDINGS  Left Ventricle: Left ventricular ejection fraction, by estimation, is 70 to 75%. The left ventricle has hyperdynamic function. The left ventricle has no regional wall motion abnormalities. The left ventricular internal cavity size was normal in size. There is moderate concentric left ventricular hypertrophy. Right Ventricle: The right ventricular size is normal. No increase in right ventricular wall thickness. Right ventricular systolic function is normal. Tricuspid regurgitation signal is inadequate for assessing PA pressure. Left Atrium: Left atrial size was severely dilated. Right Atrium: Right atrial size was moderately dilated. Mitral Valve: The mitral valve is grossly normal. No evidence of mitral valve stenosis. Tricuspid Valve: The tricuspid valve is grossly normal. Tricuspid valve regurgitation is trivial. No evidence of tricuspid stenosis. Aorta: Aortic dilatation noted. There is mild dilatation of the aortic root, measuring 41 mm. Venous: The inferior vena cava is dilated in size with greater than 50% respiratory variability, suggesting right atrial pressure of 8 mmHg. Additional Comments: There is a small pleural effusion in the left lateral region.  LEFT VENTRICLE PLAX 2D LVIDd:         3.60 cm LVIDs:         2.30 cm LV PW:         1.40 cm LV IVS:        1.20 cm LVOT diam:     2.60 cm LVOT Area:  5.31 cm  IVC IVC diam: 2.45 cm LEFT ATRIUM         Index LA diam:    4.90 cm 2.16 cm/m   AORTA Ao Root diam: 4.10 cm Ao Asc diam:  3.60 cm  SHUNTS Systemic Diam: 2.60 cm Eleonore Chiquito MD Electronically signed by Eleonore Chiquito MD Signature Date/Time: 08/19/2022/2:13:37 PM    Final        LOS: 3 days   [facility] Pager on [email or web address]  08/20/2022, 10:03 AM

## 2022-08-20 NOTE — Progress Notes (Signed)
[facility] night cross cover note:   I was notified by RN that the patient exhibited a 37 beat run of V. tach on telemetry before returning to sinus rhythm.  Other vital signs stable, including normotensive blood pressure, oxygen saturation is in the mid 90s on room air, and he remains afebrile.  Completely asymptomatic at the time of this run of V. tach, including no associated chest pain.  Per my chart review, it appears that he has been having intermittent runs of V. tach throughout this hospital course.  Will follow for results of ensuing potassium level via CMP ordered for the morning, as well as considering serum magnesium level, also ordered with morning labs. I subsequently renewed his order for telemetry, which was about to expire in the next 30 minutes.     Babs Bertin, DO Hospitalist

## 2022-08-21 DIAGNOSIS — J101 Influenza due to other identified influenza virus with other respiratory manifestations: Secondary | ICD-10-CM | POA: Diagnosis not present

## 2022-08-21 DIAGNOSIS — I4729 Other ventricular tachycardia: Secondary | ICD-10-CM | POA: Diagnosis not present

## 2022-08-21 NOTE — Progress Notes (Signed)
Physical Therapy Treatment Patient Details Name: [name] MRN: [id number] DOB: 08-29-1945 Today's Date: 08/21/2022   History of Present Illness Pt is a 77 y.o. M who presens 08/16/2022 with AMS. CT head negative, positive for influenza. Significant PMH:  ETOH dependence in remission, BPH, prostate CA, chronic back pain, DM, COPD, HLD, HTN, transient a-fib, SDH (03/2022), recent PE, CVA.    PT Comments    Pt initially very agreeable to OOB mobility, states he wants to get back to walking. Once PT started assisting pt to EOB, pt became fearful of falling with heavy posterior bias, max cues for anterior leaning to correct. Pt remains fearful of falling throughout session, overall requiring mod +2 assist for transfer into stand and pivot OOB to recliner. PT to continue to follow and will progress pt as tolerated.      Recommendations for follow up therapy are one component of a multi-disciplinary discharge planning process, led by the attending physician.  Recommendations may be updated based on patient status, additional functional criteria and insurance authorization.  Follow Up Recommendations  Skilled nursing-short term rehab (<3 hours/day) Can patient physically be transported by private vehicle: No   Assistance Recommended at Discharge Frequent or constant Supervision/Assistance  Patient can return home with the following Two people to help with walking and/or transfers;A lot of help with bathing/dressing/bathroom   Equipment Recommendations  Other (comment) (defer)    Recommendations for Other Services       Precautions / Restrictions Precautions Precautions: Fall Restrictions Weight Bearing Restrictions: No     Mobility  Bed Mobility Overal bed mobility: Needs Assistance Bed Mobility: Supine to Sit     Supine to sit: Mod assist, +2 for safety/equipment     General bed mobility comments: assist for trunk elevation, LE translation to EOB, scooting forward with  assist of hip boost with bed pads.    Transfers Overall transfer level: Needs assistance Equipment used: 2 person hand held assist Transfers: Bed to chair/wheelchair/BSC, Sit to/from Stand Sit to Stand: Mod assist, +2 physical assistance          Lateral/Scoot Transfers: Mod assist, +2 physical assistance General transfer comment: assist for power up, rise, steadying, and hip extension and pt still unable to come to full upright stand mostly limited by anxiety. opted for lateral scoot to recliner towards R, assist for hip translation and boosting    Ambulation/Gait                   Stairs             Wheelchair Mobility    Modified Rankin (Stroke Patients Only)       Balance Overall balance assessment: Needs assistance Sitting-balance support: Feet supported Sitting balance-Leahy Scale: Poor Sitting balance - Comments: heavy posterior bias Postural control: Posterior lean Standing balance support: During functional activity, Reliant on assistive device for balance Standing balance-Leahy Scale: Zero Standing balance comment: reliant on external support                            Cognition Arousal/Alertness: Awake/alert Behavior During Therapy: Flat affect, Agitated Overall Cognitive Status: Impaired/Different from baseline Area of Impairment: Memory, Following commands, Safety/judgement, Awareness, Problem solving, Attention                   Current Attention Level: Sustained Memory: Decreased short-term memory Following Commands: Follows one step commands with increased time Safety/Judgement: Decreased awareness of safety, Decreased awareness  of deficits Awareness: Intellectual Problem Solving: Slow processing, Difficulty sequencing, Requires verbal cues General Comments: pt anxious and fearful of falling throughout session, requires encouragement and cuing throughout to overcome        Exercises      General Comments         Pertinent Vitals/Pain Pain Assessment Pain Assessment: Faces Faces Pain Scale: Hurts a little bit Pain Location: generalized during mobility Pain Descriptors / Indicators: Discomfort, Guarding Pain Intervention(s): Limited activity within patient's tolerance, Monitored during session, Repositioned    Home Living                          Prior Function            PT Goals (current goals can now be found in the care plan section) Acute Rehab PT Goals PT Goal Formulation: With patient/family Time For Goal Achievement: 08/31/22 Potential to Achieve Goals: Good Progress towards PT goals: Progressing toward goals    Frequency    Min 2X/week      PT Plan Current plan remains appropriate    Co-evaluation              AM-PAC PT "6 Clicks" Mobility   Outcome Measure  Help needed turning from your back to your side while in a flat bed without using bedrails?: A Lot Help needed moving from lying on your back to sitting on the side of a flat bed without using bedrails?: A Lot Help needed moving to and from a bed to a chair (including a wheelchair)?: A Lot Help needed standing up from a chair using your arms (e.g., wheelchair or bedside chair)?: Total Help needed to walk in hospital room?: Total Help needed climbing 3-5 steps with a railing? : Total 6 Click Score: 9    End of Session   Activity Tolerance: Patient limited by fatigue Patient left: in chair;with call bell/phone within reach;with chair alarm set Nurse Communication: Mobility status;Need for lift equipment (maximove for back to bed) PT Visit Diagnosis: Muscle weakness (generalized) (M62.81);Other abnormalities of gait and mobility (R26.89)     Time: 6945-0388 PT Time Calculation (min) (ACUTE ONLY): 30 min  Charges:  $Therapeutic Activity: 8-22 mins $Neuromuscular Re-education: 8-22 mins                    Stacie Glaze, PT DPT Acute Rehabilitation Services Pager [phone]  Office  [phone]    Roxine Caddy E Ruffin Pyo 08/21/2022, 1:22 PM

## 2022-08-21 NOTE — Progress Notes (Signed)
Speech Language Pathology Treatment: Dysphagia  Patient Details Name: [name] MRN: [id number] DOB: May 12, 1946 Today's Date: 08/21/2022 Time: 3664-4034 SLP Time Calculation (min) (ACUTE ONLY): 15 min  Assessment / Plan / Recommendation Clinical Impression  Patient seen by SLP for skilled treatment focused on dysphagia goals. When SLP arrived, he was sitting in recliner, RN was in room and dinner meal tray on table beside him. He was holding cup and drinking liquids through straw without observed difficulties. Patient requesting assistance with TV and then c/o his buttocks hurting and asking SLP to help him call his daughter. He told daughter on phone that he wanted her to bring some "preparation H". SLP showed patient meal tray and asked if he wanted help getting setup. He then became irritated as he did previous session, "why you pushing me to eat?" SLP tried to explain that staff have been more frequently attempting to encourage him to eat because his PO intake has been extremely poor. Patient then stopped interacting with SLP. SLP left room and patient calling out "will you come help me eat?" SLP stood at doorway and about to enter room but patient getting irritated that SLP wasn't helping him get out of recliner. As patient has not been participating, has no h/o dysphagia and he seems to continue with very poor PO intake secondary to cognitive impairment, SLP to upgrade to regular textures solids and s/o at this time. Please reorder if patient exhibiting any coughing, throat clearing or other concerns regarding swallowing.   HPI HPI: Patient is a 77 y.o. male with PMH: ETOH dependence in remission, BPH, prostate CA, chronic back pain, DM, COPD, HLD, HTN, transient a-fib, SDH (03/2022), recent PE, CVA. He presented to the hospital from home (lives with daughter) on 08/16/22 with AMS after daughter observed him to be altered, in bathroom unable to pull on his clothes. (this has previously occured  with UTI) Since his SDH, daughter has noticed progressive cognitive impairment.  In ER, CXR unchanged, head CT negative, postive for influenza. He was made NPO pending SLP swallow evaluation secondary to AMS.      SLP Plan  Discharge SLP treatment due to (comment) (patient refusing majority of PO's)      Recommendations for follow up therapy are one component of a multi-disciplinary discharge planning process, led by the attending physician.  Recommendations may be updated based on patient status, additional functional criteria and insurance authorization.    Recommendations  Diet recommendations: Regular;Thin liquid Liquids provided via: Cup;Straw Medication Administration: Whole meds with liquid Supervision: Intermittent supervision to cue for compensatory strategies;Staff to assist with self feeding Compensations: Slow rate;Small sips/bites;Minimize environmental distractions Postural Changes and/or Swallow Maneuvers: Seated upright 90 degrees;Upright 30-60 min after meal                Oral Care Recommendations: Oral care BID Follow Up Recommendations: Skilled nursing-short term rehab (<3 hours/day) Assistance recommended at discharge: Frequent or constant Supervision/Assistance SLP Visit Diagnosis: Dysphagia, unspecified (R13.10) Plan: Discharge SLP treatment due to (comment) (patient refusing majority of PO's)           Sonia Baller, MA, CCC-SLP Speech Therapy

## 2022-08-21 NOTE — Care Management Important Message (Signed)
Important Message  Patient Details  Name: [name] MRN: [id number] Date of Birth: 05/22/1946   Medicare Important Message Given:  Yes     Shelda Altes 08/21/2022, 4:06 PM

## 2022-08-21 NOTE — Progress Notes (Signed)
[facility] PROGRESS NOTE   [name] VEL:[id number] DOB: 08-24-1945 DOA: 08/16/2022  PCP: Biagio Borg, MD  Brief History/Interval Summary: 77 y.o. male with medical history significant of ETOH dependence in  remission; BPH; prostate CA; chronic back pain; DM; COPD; HLD; HTN; transient afib; SDH (03/2022); recent PE on Eliquis; and CVA presenting with AMS.   Patient tested positive for influenza.  Hospitalized for further management.  Consultants: None  Procedures: None    Subjective/Interval History: Patient lying comfortably on the bed.  Denies any complaints this morning.  Cough appears to be improving.  No chest pain or shortness of breath.  No nausea or vomiting.    Assessment/Plan:  Influenza A Patient will complete course of Tamiflu today.  Respiratory status appears to have stabilized.  Continue with Mucinex.  Incentive spirometry.  Mobilize.     Bilateral pulmonary embolism  -Hospitalized about 1 month ago (12/15-19) -Continue eliquis, no evidence of acute blood loss   Acute metabolic encephalopathy with underlying apparent cognitive impairment -Patient with SDH after a fall on 9/15 -Daughter has noticed mild progressive cognitive impairment since -UA reviewed, not suggestive of UTI On Dysphagia 3 diet -Head CT reviewed. No acute intracranial abnormality with old L parietoccipital infarct, severe chronic small vessel disease, moderate cerebral atrophy -Now more oriented after IVF Neurological status is stable.   NSVT Patient with recurrent episodes of nonsustained ventricular tachycardia.  Recent echocardiogram showed EF to be 70 to 75%.  Patient was placed on carvedilol dose was increased recently.   Monitor electrolytes.  Keep potassium greater than 4 and magnesium greater than 2. Continue magnesium oxide Telemetry shows improvement in the burden of PVCs and NSVT's.   Diabetes mellitus type II  -Last A1c was 7.0, reasonable control -Not on  home meds -Cover with moderate-scale SSI  -glycemic trends stable   COPD Seems to be stable.  Essential hypertension  -Continue carvedilol -BP stable   HLD -Continue rusuvastatin   BPH -Continue tamsulosin   Chronic diastolic CHF with lymphedema Had to be given IV fluids due to concern for hypovolemia during this admission.  Furosemide currently on hold.  Should be able to resume at discharge.  Currently off of IV fluids.  Right foot pain Possibly due to neuropathy.  Has good pulses.    Chronic atrial fibrillation -Rate controlled with Coreg -Continue Eliquis   Hypomagnesemia Continue to monitor.  Continue magnesium oxide.   DVT Prophylaxis: On Eliquis Code Status: Full code Family Communication: Discussed with patient.  No family at bedside Disposition Plan: Medically stable for SNF.  Due to flu the skilled nursing facility can take him only on 2/3.  Status is: Inpatient Remains inpatient appropriate because: Influenza      Medications: Scheduled:  apixaban  5 mg Oral BID   carvedilol  12.5 mg Oral BID WC   feeding supplement  237 mL Oral TID BM   guaiFENesin  600 mg Oral BID   magnesium oxide  400 mg Oral BID   multivitamin with minerals  1 tablet Oral Daily   oseltamivir  75 mg Oral BID   rosuvastatin  20 mg Oral QPM   sodium chloride flush  3 mL Intravenous Q12H   tamsulosin  0.4 mg Oral QPC supper   Continuous:   CHE:NIDPOEUMPNTIR **OR** acetaminophen, albuterol, bisacodyl, dextrose, guaiFENesin, hydrALAZINE, ondansetron **OR** ondansetron (ZOFRAN) IV, oxyCODONE, polyethylene glycol  Antibiotics: Anti-infectives (From admission, onward)    Start     Dose/Rate Route Frequency Ordered Stop  08/16/22 1800  oseltamivir (TAMIFLU) capsule 75 mg        75 mg Oral 2 times daily 08/16/22 1643 08/21/22 2159       Objective:  Vital Signs  Vitals:   08/20/22 1100 08/20/22 2045 08/21/22 0019 08/21/22 0812  BP: 131/84 122/62 135/65 125/83  Pulse:  60 (!) 56 (!) 58 68  Resp: '19 20 20 20  '$ Temp:  98.7 F (37.1 C) 98.7 F (37.1 C) 97.7 F (36.5 C)  TempSrc:  Oral Oral Oral  SpO2: 97% 96% 98% 97%  Weight:   103.7 kg     Intake/Output Summary (Last 24 hours) at 08/21/2022 0942 Last data filed at 08/21/2022 0813 Gross per 24 hour  Intake 60 ml  Output 500 ml  Net -440 ml    Filed Weights   08/19/22 0239 08/20/22 0415 08/21/22 0019  Weight: 103.2 kg 102 kg 103.7 kg    General appearance: Awake alert.  In no distress Resp: Improved air entry bilaterally.  Few crackles at the bases.  No wheezing or rhonchi. Cardio: S1-S2 is normal regular.  No S3-S4.  No rubs murmurs or bruit GI: Abdomen is soft.  Nontender nondistended.  Bowel sounds are present normal.  No masses organomegaly Extremities: No edema.  Physical deconditioning noted.   Lab Results:  Data Reviewed: I have personally reviewed following labs and reports of the imaging studies  CBC: Recent Labs  Lab 08/16/22 1228 08/17/22 0044 08/18/22 0055 08/19/22 0136 08/20/22 0539  WBC 6.7 6.6 3.9* 3.5* 5.2  NEUTROABS 5.2  --   --   --   --   HGB 12.9* 12.9* 13.6 15.0 13.2  HCT 40.2 40.0 43.5 47.3 41.3  MCV 86.3 84.2 85.3 84.8 85.2  PLT 188 174 178 116* 132*     Basic Metabolic Panel: Recent Labs  Lab 08/16/22 1344 08/16/22 2110 08/17/22 0044 08/18/22 0055 08/19/22 0136 08/20/22 0539  NA 132*  --  136 135 137 136  K 5.3*  --  3.7 3.6 4.4 4.0  CL 103  --  105 104 108 107  CO2 20*  --  25 21* 22 21*  GLUCOSE 95  --  109* 117* 111* 101*  BUN 12  --  '11 12 12 12  '$ CREATININE 1.24  --  1.40* 1.30* 1.20 1.25*  CALCIUM 7.7*  --  8.4* 8.5* 8.4* 8.1*  MG  --  1.3* 1.3* 2.2 1.8 1.8     GFR: Estimated Creatinine Clearance: 64.6 mL/min (A) (by C-G formula based on SCr of 1.25 mg/dL (H)).  Liver Function Tests: Recent Labs  Lab 08/16/22 1344 08/18/22 0055 08/19/22 0136 08/20/22 0539  AST 32 43* 50* 50*  ALT '12 15 18 20  '$ ALKPHOS 122 122 114 110  BILITOT  0.8 0.5 0.7 1.0  PROT 6.8 7.6 7.0 6.6  ALBUMIN 2.4* 2.4* 2.2* 2.1*      Coagulation Profile: Recent Labs  Lab 08/16/22 1147  INR 1.5*       CBG: Recent Labs  Lab 08/17/22 0026 08/17/22 0427 08/17/22 0810 08/17/22 1101 08/17/22 1536  GLUCAP 85 117* 136* 129* 108*      Recent Results (from the past 240 hour(s))  Resp panel by RT-PCR (RSV, Flu A&B, Covid) Anterior Nasal Swab     Status: Abnormal   Collection Time: 08/16/22  2:15 PM   Specimen: Anterior Nasal Swab  Result Value Ref Range Status   SARS Coronavirus 2 by RT PCR NEGATIVE NEGATIVE Final  Comment: (NOTE) SARS-CoV-2 target nucleic acids are NOT DETECTED.  The SARS-CoV-2 RNA is generally detectable in upper respiratory specimens during the acute phase of infection. The lowest concentration of SARS-CoV-2 viral copies this assay can detect is 138 copies/mL. A negative result does not preclude SARS-Cov-2 infection and should not be used as the sole basis for treatment or other patient management decisions. A negative result may occur with  improper specimen collection/handling, submission of specimen other than nasopharyngeal swab, presence of viral mutation(s) within the areas targeted by this assay, and inadequate number of viral copies(<138 copies/mL). A negative result must be combined with clinical observations, patient history, and epidemiological information. The expected result is Negative.  Fact Sheet for Patients:  EntrepreneurPulse.com.au  Fact Sheet for Healthcare Providers:  IncredibleEmployment.be  This test is no t yet approved or cleared by the [place] FDA and  has been authorized for detection and/or diagnosis of SARS-CoV-2 by FDA under an Emergency Use Authorization (EUA). This EUA will remain  in effect (meaning this test can be used) for the duration of the COVID-19 declaration under Section 564(b)(1) of the Act, 21 U.S.C.section  360bbb-3(b)(1), unless the authorization is terminated  or revoked sooner.       Influenza A by PCR POSITIVE (A) NEGATIVE Final   Influenza B by PCR NEGATIVE NEGATIVE Final    Comment: (NOTE) The Xpert Xpress SARS-CoV-2/FLU/RSV plus assay is intended as an aid in the diagnosis of influenza from Nasopharyngeal swab specimens and should not be used as a sole basis for treatment. Nasal washings and aspirates are unacceptable for Xpert Xpress SARS-CoV-2/FLU/RSV testing.  Fact Sheet for Patients: EntrepreneurPulse.com.au  Fact Sheet for Healthcare Providers: IncredibleEmployment.be  This test is not yet approved or cleared by the [place] FDA and has been authorized for detection and/or diagnosis of SARS-CoV-2 by FDA under an Emergency Use Authorization (EUA). This EUA will remain in effect (meaning this test can be used) for the duration of the COVID-19 declaration under Section 564(b)(1) of the Act, 21 U.S.C. section 360bbb-3(b)(1), unless the authorization is terminated or revoked.     Resp Syncytial Virus by PCR NEGATIVE NEGATIVE Final    Comment: (NOTE) Fact Sheet for Patients: EntrepreneurPulse.com.au  Fact Sheet for Healthcare Providers: IncredibleEmployment.be  This test is not yet approved or cleared by the [place] FDA and has been authorized for detection and/or diagnosis of SARS-CoV-2 by FDA under an Emergency Use Authorization (EUA). This EUA will remain in effect (meaning this test can be used) for the duration of the COVID-19 declaration under Section 564(b)(1) of the Act, 21 U.S.C. section 360bbb-3(b)(1), unless the authorization is terminated or revoked.  Performed at [facility], [place] [address] [place], [place] [address]   Urine Culture (for pregnant, neutropenic or urologic patients or patients with an indwelling urinary catheter)     Status: None   Collection Time:  08/16/22  3:23 PM   Specimen: In/Out Cath Urine  Result Value Ref Range Status   Specimen Description IN/OUT CATH URINE  Final   Special Requests NONE  Final   Culture   Final    NO GROWTH Performed at [facility], [place] [address] [place], [place] [address]    Report Status 08/18/2022 FINAL  Final      Radiology Studies: ECHOCARDIOGRAM LIMITED  Result Date: 08/19/2022    ECHOCARDIOGRAM LIMITED REPORT   Patient Name:   [name] Date of Exam: 08/19/2022 Medical Rec #:  [id number]  Height:       73.0 in Accession #:    [id number]          Weight:       227.5 lb Date of Birth:  [birth year]           BSA:          2.273 m Patient Age:    73 years            BP:           142/67 mmHg Patient Gender: M                   HR:           66 bpm. Exam Location:  Inpatient Procedure: Limited Echo and Limited Color Doppler Indications:    Ventricular Tachycardia I47.2  History:        Patient has prior history of Echocardiogram examinations, most                 recent 07/06/2022. COPD and Stroke, Arrythmias:Atrial                 Fibrillation and Tachycardia; Risk Factors:Hypertension,                 Diabetes and Dyslipidemia.  Sonographer:    Ronny Flurry Referring Phys: Vayas  1. Left ventricular ejection fraction, by estimation, is 70 to 75%. The left ventricle has hyperdynamic function. The left ventricle has no regional wall motion abnormalities. There is moderate concentric left ventricular hypertrophy.  2. Right ventricular systolic function is normal. The right ventricular size is normal. Tricuspid regurgitation signal is inadequate for assessing PA pressure.  3. Left atrial size was severely dilated.  4. Right atrial size was moderately dilated.  5. The mitral valve is grossly normal. No evidence of mitral valve regurgitation. No evidence of mitral stenosis.  6. Aortic dilatation noted. There is mild dilatation of the aortic root, measuring 41 mm.   7. The inferior vena cava is dilated in size with >50% respiratory variability, suggesting right atrial pressure of 8 mmHg. Comparison(s): No significant change from prior study. FINDINGS  Left Ventricle: Left ventricular ejection fraction, by estimation, is 70 to 75%. The left ventricle has hyperdynamic function. The left ventricle has no regional wall motion abnormalities. The left ventricular internal cavity size was normal in size. There is moderate concentric left ventricular hypertrophy. Right Ventricle: The right ventricular size is normal. No increase in right ventricular wall thickness. Right ventricular systolic function is normal. Tricuspid regurgitation signal is inadequate for assessing PA pressure. Left Atrium: Left atrial size was severely dilated. Right Atrium: Right atrial size was moderately dilated. Mitral Valve: The mitral valve is grossly normal. No evidence of mitral valve stenosis. Tricuspid Valve: The tricuspid valve is grossly normal. Tricuspid valve regurgitation is trivial. No evidence of tricuspid stenosis. Aorta: Aortic dilatation noted. There is mild dilatation of the aortic root, measuring 41 mm. Venous: The inferior vena cava is dilated in size with greater than 50% respiratory variability, suggesting right atrial pressure of 8 mmHg. Additional Comments: There is a small pleural effusion in the left lateral region.  LEFT VENTRICLE PLAX 2D LVIDd:         3.60 cm LVIDs:         2.30 cm LV PW:         1.40 cm LV IVS:        1.20 cm LVOT diam:  2.60 cm LVOT Area:     5.31 cm  IVC IVC diam: 2.45 cm LEFT ATRIUM         Index LA diam:    4.90 cm 2.16 cm/m   AORTA Ao Root diam: 4.10 cm Ao Asc diam:  3.60 cm  SHUNTS Systemic Diam: 2.60 cm Eleonore Chiquito MD Electronically signed by Eleonore Chiquito MD Signature Date/Time: 08/19/2022/2:13:37 PM    Final        LOS: 4 days   [facility] Pager on [email or web address]  08/21/2022, 9:42 AM

## 2022-08-21 NOTE — Plan of Care (Signed)
  Problem: Health Behavior/Discharge Planning: Goal: Ability to manage health-related needs will improve Outcome: Not Progressing   Problem: Coping: Goal: Level of anxiety will decrease Outcome: Progressing   Problem: Pain Managment: Goal: General experience of comfort will improve Outcome: Progressing

## 2022-08-21 NOTE — TOC Progression Note (Signed)
Transition of Care [facility]) - Progression Note    Patient Details  Name: Vernell Townley MRN: [id number] Date of Birth: [birth year]  Transition of Care [facility]) CM/SW [place], LCSW Phone Number: 08/21/2022, 2:26 PM  Clinical Narrative:    CSW has started insurance auth for [facility].    Expected Discharge Plan: [place] Barriers to Discharge: Continued Medical Work up  Expected Discharge Plan and [place] Choice: [place] Living arrangements for the past 2 months: [place]:  (Is active with [facility])           Social Determinants of Health (SDOH) Interventions [place]: No Food Insecurity (07/10/2022)  Housing: Low Risk  (07/10/2022)  Transportation Needs: No Transportation Needs (07/10/2022)  Utilities: Not At Risk (07/05/2022)  Depression (PHQ2-9): Low Risk  (08/07/2022)  Tobacco Use: Medium Risk (08/16/2022)    Readmission Risk Interventions     No data to display         Beckey Rutter, MSW, LCSWA, LCASA Transitions of Care  Clinical Social Worker I

## 2022-08-22 DIAGNOSIS — I4729 Other ventricular tachycardia: Secondary | ICD-10-CM | POA: Diagnosis not present

## 2022-08-22 DIAGNOSIS — J101 Influenza due to other identified influenza virus with other respiratory manifestations: Secondary | ICD-10-CM | POA: Diagnosis not present

## 2022-08-22 LAB — BASIC METABOLIC PANEL
Anion gap: 6 (ref 5–15)
BUN: 11 mg/dL (ref 8–23)
CO2: 23 mmol/L (ref 22–32)
Calcium: 7.9 mg/dL — ABNORMAL LOW (ref 8.9–10.3)
Chloride: 108 mmol/L (ref 98–111)
Creatinine, Ser: 1.01 mg/dL (ref 0.61–1.24)
GFR, Estimated: >60 mL/min (ref >60)
Glucose, Bld: 91 mg/dL (ref 70–99)
Potassium: 3.7 mmol/L (ref 3.5–5.1)
Sodium: 137 mmol/L (ref 135–145)

## 2022-08-22 LAB — MAGNESIUM: Magnesium: 2 mg/dL (ref 1.7–2.4)

## 2022-08-22 NOTE — Progress Notes (Signed)
Assumed pt care at 0700. Aox4, tele monitored, no complaints of CP/SOB. Droplet precautions maintained. Full assessment per flowsheets, Meds given per [facility]. Safety maintained, call bell in reach, all needs made know.

## 2022-08-22 NOTE — TOC Progression Note (Signed)
Transition of Care [facility]) - Progression Note    Patient Details  Name: [name] MRN: [id number] Date of Birth: [birth year]  Transition of Care [facility]) CM/SW [place], LCSW Phone Number: 08/22/2022, 12:27 PM  Clinical Narrative:    Insurance Josem Kaufmann has been approved. CSW spoke with MD and the plan is to dc pt to [facility] tomorrow.   CSW called daughter to [place] to update on plans to dc. Daughter requested to be called tomorrow, before pt is transferred by [facility]. TOC will continue to follow.    Expected Discharge Plan: [place] Barriers to Discharge: Continued Medical Work up  Expected Discharge Plan and [place] Choice: [place] Living arrangements for the past 2 months: [place]:  (Is active with [facility])           Social Determinants of Health (SDOH) Interventions [place]: No Food Insecurity (07/10/2022)  Housing: Low Risk  (07/10/2022)  Transportation Needs: No Transportation Needs (07/10/2022)  Utilities: Not At Risk (07/05/2022)  Depression (PHQ2-9): Low Risk  (08/07/2022)  Tobacco Use: Medium Risk (08/16/2022)    Readmission Risk Interventions     No data to display         Beckey Rutter, MSW, LCSWA, LCASA Transitions of Care  Clinical Social Worker I

## 2022-08-22 NOTE — Progress Notes (Signed)
Mobility Specialist Progress Note    08/22/22 1140  Mobility  Activity Transferred from bed to chair  Level of Assistance +2 (takes two people)  Games developer wheel walker  Activity Response Tolerated poorly  Mobility Referral Yes  $Mobility charge 1 Mobility   Pre-Mobility: 71 HR, 99% SpO2  Pt received and agreeable. Pt minA w/ bed mobility and required a rest break while moving legs to EOB. Pt required verbal cueing for focus and safety cues for standing and RW positioning. Pt modA+2 to stand then up to maxA+2 to pivot to chair. Left with call bell in reach, chair alarm on and NT present.   Pt asked multiple times what day it was.   Hildred Alamin Mobility Specialist  Please Psychologist, sport and exercise or Rehab Office at [phone]

## 2022-08-22 NOTE — Progress Notes (Signed)
[facility] PROGRESS NOTE   [name] VHQ:[id number] DOB: 1945/08/11 DOA: 08/16/2022  PCP: Biagio Borg, MD  Brief History/Interval Summary: 77 y.o. male with medical history significant of ETOH dependence in  remission; BPH; prostate CA; chronic back pain; DM; COPD; HLD; HTN; transient afib; SDH (03/2022); recent PE on Eliquis; and CVA presenting with AMS.   Patient tested positive for influenza.  Hospitalized for further management.  Consultants: None  Procedures: None    Subjective/Interval History: Comfortably on the bed.  Denies any complaints.  Cough is improving.  No chest pain or shortness of breath.  No nausea or vomiting.      Assessment/Plan:  Influenza A Patient has completed course of Tamiflu.  Respiratory status has stabilized.  Saturating normal on room air.  Continue with Mucinex incentive spirometry.  Mobilize as tolerated.      Bilateral pulmonary embolism  -Hospitalized about 1 month ago (12/15-19) -Continue eliquis, no evidence of acute blood loss   Acute metabolic encephalopathy with underlying apparent cognitive impairment -Patient with SDH after a fall on 9/15 -Daughter has noticed mild progressive cognitive impairment since -UA reviewed, not suggestive of UTI On Dysphagia 3 diet -Head CT reviewed. No acute intracranial abnormality with old L parietoccipital infarct, severe chronic small vessel disease, moderate cerebral atrophy -Now more oriented after IVF Neurological status is stable.   NSVT Patient with recurrent episodes of nonsustained ventricular tachycardia.  Recent echocardiogram showed EF to be 70 to 75%.  Patient was placed on carvedilol dose was increased recently.   Monitor electrolytes.  Keep potassium greater than 4 and magnesium greater than 2. Continue magnesium oxide. Telemetry continues to show few episodes of PVCs and NSVT but burden appears to have decreased.   Diabetes mellitus type II  -Last A1c was 7.0,  reasonable control -Not on home meds -Cover with moderate-scale SSI  -glycemic trends stable   COPD Seems to be stable.  Essential hypertension  -Continue carvedilol -BP stable   HLD -Continue rusuvastatin   BPH -Continue tamsulosin   Chronic diastolic CHF with lymphedema Had to be given IV fluids due to concern for hypovolemia during this admission.  Furosemide currently on hold.  Should be able to resume at discharge.  Currently off of IV fluids.  Right foot pain Possibly due to neuropathy.  Has good pulses.    Chronic atrial fibrillation -Rate controlled with Coreg -Continue Eliquis   Hypomagnesemia Continue to monitor.  Continue magnesium oxide.   DVT Prophylaxis: On Eliquis Code Status: Full code Family Communication: Discussed with patient.  No family at bedside Disposition Plan: Medically stable for SNF.  Due to flu the skilled nursing facility can take him only on 2/3.  Status is: Inpatient Remains inpatient appropriate because: Influenza      Medications: Scheduled:  apixaban  5 mg Oral BID   carvedilol  12.5 mg Oral BID WC   feeding supplement  237 mL Oral TID BM   guaiFENesin  600 mg Oral BID   magnesium oxide  400 mg Oral BID   multivitamin with minerals  1 tablet Oral Daily   rosuvastatin  20 mg Oral QPM   sodium chloride flush  3 mL Intravenous Q12H   tamsulosin  0.4 mg Oral QPC supper   Continuous:   UXL:[id number] **OR** acetaminophen, albuterol, bisacodyl, dextrose, guaiFENesin, hydrALAZINE, ondansetron **OR** ondansetron (ZOFRAN) IV, oxyCODONE, polyethylene glycol  Antibiotics: Anti-infectives (From admission, onward)    Start     Dose/Rate Route Frequency Ordered Stop  08/16/22 1800  oseltamivir (TAMIFLU) capsule 75 mg        75 mg Oral 2 times daily 08/16/22 1643 08/21/22 2159       Objective:  Vital Signs  Vitals:   08/22/22 0400 08/22/22 0417 08/22/22 0600 08/22/22 0802  BP:  (!) 134/90  132/75  Pulse: 61  (!) 57  60  Resp:  18  18  Temp:  (!) 97.5 F (36.4 C)  (!) 97.5 F (36.4 C)  TempSrc:  Oral  Oral  SpO2: 91%  99% 100%  Weight:        Intake/Output Summary (Last 24 hours) at 08/22/2022 1007 Last data filed at 08/21/2022 1500 Gross per 24 hour  Intake --  Output 200 ml  Net -200 ml    Filed Weights   08/20/22 0415 08/21/22 0019 08/22/22 0026  Weight: 102 kg 103.7 kg 105.1 kg    General appearance: Awake alert.  In no distress Resp: Clear to auscultation bilaterally.  Normal effort Cardio: S1-S2 is normal regular.  No S3-S4.  No rubs murmurs or bruit GI: Abdomen is soft.  Nontender nondistended.  Bowel sounds are present normal.  No masses organomegaly Extremities: No edema.     Lab Results:  Data Reviewed: I have personally reviewed following labs and reports of the imaging studies  CBC: Recent Labs  Lab 08/16/22 1228 08/17/22 0044 08/18/22 0055 08/19/22 0136 08/20/22 0539  WBC 6.7 6.6 3.9* 3.5* 5.2  NEUTROABS 5.2  --   --   --   --   HGB 12.9* 12.9* 13.6 15.0 13.2  HCT 40.2 40.0 43.5 47.3 41.3  MCV 86.3 84.2 85.3 84.8 85.2  PLT 188 174 178 116* 132*     Basic Metabolic Panel: Recent Labs  Lab 08/17/22 0044 08/18/22 0055 08/19/22 0136 08/20/22 0539 08/22/22 0059  NA 136 135 137 136 137  K 3.7 3.6 4.4 4.0 3.7  CL 105 104 108 107 108  CO2 25 21* 22 21* 23  GLUCOSE 109* 117* 111* 101* 91  BUN '11 12 12 12 11  '$ CREATININE 1.40* 1.30* 1.20 1.25* 1.01  CALCIUM 8.4* 8.5* 8.4* 8.1* 7.9*  MG 1.3* 2.2 1.8 1.8 2.0     GFR: Estimated Creatinine Clearance: 80.4 mL/min (by C-G formula based on SCr of 1.01 mg/dL).  Liver Function Tests: Recent Labs  Lab 08/16/22 1344 08/18/22 0055 08/19/22 0136 08/20/22 0539  AST 32 43* 50* 50*  ALT '12 15 18 20  '$ ALKPHOS 122 122 114 110  BILITOT 0.8 0.5 0.7 1.0  PROT 6.8 7.6 7.0 6.6  ALBUMIN 2.4* 2.4* 2.2* 2.1*      Coagulation Profile: Recent Labs  Lab 08/16/22 1147  INR 1.5*       CBG: Recent Labs  Lab  08/17/22 0026 08/17/22 0427 08/17/22 0810 08/17/22 1101 08/17/22 1536  GLUCAP 85 117* 136* 129* 108*      Recent Results (from the past 240 hour(s))  Resp panel by RT-PCR (RSV, Flu A&B, Covid) Anterior Nasal Swab     Status: Abnormal   Collection Time: 08/16/22  2:15 PM   Specimen: Anterior Nasal Swab  Result Value Ref Range Status   SARS Coronavirus 2 by RT PCR NEGATIVE NEGATIVE Final    Comment: (NOTE) SARS-CoV-2 target nucleic acids are NOT DETECTED.  The SARS-CoV-2 RNA is generally detectable in upper respiratory specimens during the acute phase of infection. The lowest concentration of SARS-CoV-2 viral copies this assay can detect is 138 copies/mL. A negative result  does not preclude SARS-Cov-2 infection and should not be used as the sole basis for treatment or other patient management decisions. A negative result may occur with  improper specimen collection/handling, submission of specimen other than nasopharyngeal swab, presence of viral mutation(s) within the areas targeted by this assay, and inadequate number of viral copies(<138 copies/mL). A negative result must be combined with clinical observations, patient history, and epidemiological information. The expected result is Negative.  Fact Sheet for Patients:  EntrepreneurPulse.com.au  Fact Sheet for Healthcare Providers:  IncredibleEmployment.be  This test is no t yet approved or cleared by the [place] FDA and  has been authorized for detection and/or diagnosis of SARS-CoV-2 by FDA under an Emergency Use Authorization (EUA). This EUA will remain  in effect (meaning this test can be used) for the duration of the COVID-19 declaration under Section 564(b)(1) of the Act, 21 U.S.C.section 360bbb-3(b)(1), unless the authorization is terminated  or revoked sooner.       Influenza A by PCR POSITIVE (A) NEGATIVE Final   Influenza B by PCR NEGATIVE NEGATIVE Final    Comment:  (NOTE) The Xpert Xpress SARS-CoV-2/FLU/RSV plus assay is intended as an aid in the diagnosis of influenza from Nasopharyngeal swab specimens and should not be used as a sole basis for treatment. Nasal washings and aspirates are unacceptable for Xpert Xpress SARS-CoV-2/FLU/RSV testing.  Fact Sheet for Patients: EntrepreneurPulse.com.au  Fact Sheet for Healthcare Providers: IncredibleEmployment.be  This test is not yet approved or cleared by the [place] FDA and has been authorized for detection and/or diagnosis of SARS-CoV-2 by FDA under an Emergency Use Authorization (EUA). This EUA will remain in effect (meaning this test can be used) for the duration of the COVID-19 declaration under Section 564(b)(1) of the Act, 21 U.S.C. section 360bbb-3(b)(1), unless the authorization is terminated or revoked.     Resp Syncytial Virus by PCR NEGATIVE NEGATIVE Final    Comment: (NOTE) Fact Sheet for Patients: EntrepreneurPulse.com.au  Fact Sheet for Healthcare Providers: IncredibleEmployment.be  This test is not yet approved or cleared by the [place] FDA and has been authorized for detection and/or diagnosis of SARS-CoV-2 by FDA under an Emergency Use Authorization (EUA). This EUA will remain in effect (meaning this test can be used) for the duration of the COVID-19 declaration under Section 564(b)(1) of the Act, 21 U.S.C. section 360bbb-3(b)(1), unless the authorization is terminated or revoked.  Performed at [facility], [place] [address], [place], [place] [address]   Urine Culture (for pregnant, neutropenic or urologic patients or patients with an indwelling urinary catheter)     Status: None   Collection Time: 08/16/22  3:23 PM   Specimen: In/Out Cath Urine  Result Value Ref Range Status   Specimen Description IN/OUT CATH URINE  Final   Special Requests NONE  Final   Culture   Final    NO  GROWTH Performed at [facility], [place] [address], [place], [place] [address]    Report Status 08/18/2022 FINAL  Final      Radiology Studies: No results found.     LOS: 5 days   Casey Fye [employer] on [email or web address]  08/22/2022, 10:07 AM

## 2022-08-22 NOTE — Progress Notes (Signed)
Occupational Therapy Treatment Patient Details Name: [name] Sexson MRN: [id number] DOB: 07/08/1946 Today's Date: 08/22/2022   History of present illness Pt is a 77 y.o. M who presens 08/16/2022 with AMS. CT head negative, positive for influenza. Significant PMH:  ETOH dependence in remission, BPH, prostate CA, chronic back pain, DM, COPD, HLD, HTN, transient a-fib, SDH (03/2022), recent PE, CVA.   OT comments  Pt. Seen for skilled OT treatment session.  Focus of session was pt. Participation in bed mobility as pre curser for increasing mobility for further activities and functional tasks.  Pt. Able to push with bles supported by therapist asst. While using b ues to pull on bed rails with max a.  Self feeding with min a bed level.  Difficulty with locating items on tray and opening packages and containers.  Will continue with current acute ot poc with cont. Focus on above stated tasks also.     Recommendations for follow up therapy are one component of a multi-disciplinary discharge planning process, led by the attending physician.  Recommendations may be updated based on patient status, additional functional criteria and insurance authorization.    Follow Up Recommendations  Skilled nursing-short term rehab (<3 hours/day)     Assistance Recommended at Discharge Frequent or constant Supervision/Assistance  Patient can return home with the following  A lot of help with bathing/dressing/bathroom;Assistance with cooking/housework;Direct supervision/assist for medications management;Direct supervision/assist for financial management;Assist for transportation;Help with stairs or ramp for entrance;Two people to help with walking and/or transfers   Equipment Recommendations       Recommendations for Other Services      Precautions / Restrictions Precautions Precautions: Fall       Mobility Bed Mobility Overal bed mobility: Needs Assistance             General bed mobility comments:  pt. able to bend knees with assistance and support of BLES to use for pushing. hand over hand assistance to utilize bed rails to assist with pulling/pushing up in bed for better positioning    Transfers                         Balance                                           ADL either performed or assessed with clinical judgement   ADL Overall ADL's : Needs assistance/impaired Eating/Feeding: Sitting;Minimal assistance;Bed level Eating/Feeding Details (indicate cue type and reason): pt. required asssistance with opening/managing containers.  also with identification of items.                                   General ADL Comments: focus on bed mobility and self feeding during session.    Extremity/Trunk Assessment              Vision       Public librarian Instructions  General Comments      Pertinent Vitals/ Pain       Pain Assessment Pain Assessment: No/denies pain  Home Living                                          Prior Functioning/Environment              Frequency  Min 2X/week        Progress Toward Goals  OT Goals(current goals can now be found in the care plan section)  Progress towards OT goals: Progressing toward goals     Plan Discharge plan remains appropriate    Co-evaluation                 AM-PAC OT "6 Clicks" Daily Activity     Outcome Measure   Help from another person eating meals?: A Little Help from another person taking care of personal grooming?: A Little Help from another person toileting, which includes using toliet, bedpan, or urinal?: A Lot Help from another person bathing (including washing, rinsing, drying)?: A Lot Help from another person to put on and taking off regular upper body clothing?: A Lot Help from another  person to put on and taking off regular lower body clothing?: Total 6 Click Score: 13    End of Session    OT Visit Diagnosis: Unsteadiness on feet (R26.81);Other abnormalities of gait and mobility (R26.89);Muscle weakness (generalized) (M62.81)   Activity Tolerance Patient tolerated treatment well   Patient Left in bed;with call bell/phone within reach;with nursing/sitter in room   Nurse Communication Other (comment) (rn present at end of session giving morning meds)        Time: 1275-1700 OT Time Calculation (min): 16 min  Charges: OT General Charges $OT Visit: 1 Visit OT Treatments $Self Care/Home Management : 8-22 mins  Sonia Baller, COTA/L Acute Rehabilitation [phone]   Clearnce Sorrel Lorraine-COTA/L 08/22/2022, 12:29 PM

## 2022-08-23 DIAGNOSIS — N390 Urinary tract infection, site not specified: Secondary | ICD-10-CM | POA: Diagnosis not present

## 2022-08-23 DIAGNOSIS — R1312 Dysphagia, oropharyngeal phase: Secondary | ICD-10-CM | POA: Diagnosis not present

## 2022-08-23 DIAGNOSIS — M6281 Muscle weakness (generalized): Secondary | ICD-10-CM | POA: Diagnosis not present

## 2022-08-23 DIAGNOSIS — R2681 Unsteadiness on feet: Secondary | ICD-10-CM | POA: Diagnosis not present

## 2022-08-23 DIAGNOSIS — R2689 Other abnormalities of gait and mobility: Secondary | ICD-10-CM | POA: Diagnosis not present

## 2022-08-23 DIAGNOSIS — Z7401 Bed confinement status: Secondary | ICD-10-CM | POA: Diagnosis not present

## 2022-08-23 DIAGNOSIS — R531 Weakness: Secondary | ICD-10-CM | POA: Diagnosis not present

## 2022-08-23 DIAGNOSIS — R1311 Dysphagia, oral phase: Secondary | ICD-10-CM | POA: Diagnosis not present

## 2022-08-23 DIAGNOSIS — Z743 Need for continuous supervision: Secondary | ICD-10-CM | POA: Diagnosis not present

## 2022-08-23 DIAGNOSIS — J101 Influenza due to other identified influenza virus with other respiratory manifestations: Secondary | ICD-10-CM | POA: Diagnosis not present

## 2022-08-23 MED ORDER — POLYETHYLENE GLYCOL 3350 17 G PO PACK: 17.0000 g | PACK | Freq: Every day | ORAL | 0 refills | Status: DC | PRN

## 2022-08-23 MED ORDER — GUAIFENESIN ER 600 MG PO TB12: 600.0000 mg | ORAL_TABLET | Freq: Two times a day (BID) | ORAL | Status: DC

## 2022-08-23 MED ORDER — ENSURE ENLIVE PO LIQD: 237.0000 mL | Freq: Three times a day (TID) | ORAL | 12 refills | Status: DC

## 2022-08-23 MED ORDER — CARVEDILOL 12.5 MG PO TABS: 12.5000 mg | ORAL_TABLET | Freq: Two times a day (BID) | ORAL | Status: DC

## 2022-08-23 MED ORDER — ADULT MULTIVITAMIN W/MINERALS CH: 1.0000 | ORAL_TABLET | Freq: Every day | ORAL | Status: DC

## 2022-08-23 MED ORDER — MAGNESIUM OXIDE -MG SUPPLEMENT 400 (240 MG) MG PO TABS: 400.0000 mg | ORAL_TABLET | Freq: Two times a day (BID) | ORAL | Status: DC

## 2022-08-23 MED ORDER — GUAIFENESIN 100 MG/5ML PO LIQD: 5.0000 mL | ORAL | 0 refills | Status: DC | PRN

## 2022-08-23 NOTE — Progress Notes (Signed)
Approximately 12:00-- This RN called pt's daughter, [name], and notified her of pt discharge orders and transport arranged by [facility]. Daughter verbalized understanding and stated that she would be bringing clothes for pt to wear upon discharge, as he currently has no clothing with him in the hospital.

## 2022-08-23 NOTE — Discharge Summary (Signed)
[facility]  Physician Discharge Summary   Patient ID: [name] MRN: [id number] DOB/AGE: 1946/04/26 77 y.o.  Admit date: 08/16/2022 Discharge date:   08/23/2022   PCP: Biagio Borg, MD  DISCHARGE DIAGNOSES:    Influenza A   Hyperlipidemia with target LDL less than 100   COPD (chronic obstructive pulmonary disease) (HCC)   BPH (benign prostatic hyperplasia)   Diabetes mellitus type II, non insulin dependent (HCC)   NSVT (nonsustained ventricular tachycardia) (HCC)   HTN (hypertension)   History of pulmonary embolism   MCI (mild cognitive impairment)   RECOMMENDATIONS FOR OUTPATIENT FOLLOW UP: Please check CBC, basic metabolic panel and magnesium level in 1 week   Home Health: Going to SNF Equipment/Devices: None  CODE STATUS: Full code  DISCHARGE CONDITION: fair  Diet recommendation: Heart healthy  INITIAL HISTORY: 77 y.o. male with medical history significant of ETOH dependence in  remission; BPH; prostate CA; chronic back pain; DM; COPD; HLD; HTN; transient afib; SDH (03/2022); recent PE on Eliquis; and CVA presenting with AMS.   Patient tested positive for influenza.  Hospitalized for further management.    HOSPITAL COURSE:   Influenza A Patient has completed course of Tamiflu.  Respiratory status has stabilized.  Saturating normal on room air.  Continue with Mucinex incentive spirometry.  Mobilize as tolerated.      Bilateral pulmonary embolism  -Hospitalized about 1 month ago (12/15-19) -Continue eliquis, no evidence of acute blood loss   Acute metabolic encephalopathy with underlying apparent cognitive impairment -Patient with SDH after a fall on 9/15 -Daughter has noticed mild progressive cognitive impairment since -UA reviewed, not suggestive of UTI On Dysphagia 3 diet -Head CT reviewed. No acute intracranial abnormality with old L parietoccipital infarct, severe chronic small vessel disease, moderate cerebral atrophy -Now more oriented  after IVF Neurological status is stable.   NSVT Patient with recurrent episodes of nonsustained ventricular tachycardia.  Recent echocardiogram showed EF to be 70 to 75%.  Patient was placed on carvedilol and dose was increased recently.   Continue to monitor electrolytes.  Patient remains asymptomatic.   Diabetes mellitus type II  -Last A1c was 7.0, reasonable control   COPD Seems to be stable.   Essential hypertension  -Continue carvedilol -BP stable   HLD -Continue rusuvastatin   BPH -Continue tamsulosin   Chronic diastolic CHF with lymphedema Had to be given IV fluids due to concern for hypovolemia during this admission.  Furosemide currently on hold.  Should be able to resume at discharge.    Right foot pain Possibly due to neuropathy.  Has good pulses.    Chronic atrial fibrillation -Rate controlled with Coreg -Continue Eliquis   Hypomagnesemia Continue to monitor.  Continue magnesium oxide.   Obesity Estimated body mass index is 31.3 kg/m as calculated from the following:   Height as of 08/07/22: '6\' 1"'$  (1.854 m).   Weight as of this encounter: 107.6 kg.  Patient is stable.  Okay for discharge to SNF.  PERTINENT LABS:  The results of significant diagnostics from this hospitalization (including imaging, microbiology, ancillary and laboratory) are listed below for reference.    Microbiology: Recent Results (from the past 240 hour(s))  Resp panel by RT-PCR (RSV, Flu A&B, Covid) Anterior Nasal Swab     Status: Abnormal   Collection Time: 08/16/22  2:15 PM   Specimen: Anterior Nasal Swab  Result Value Ref Range Status   SARS Coronavirus 2 by RT PCR NEGATIVE NEGATIVE Final    Comment: (NOTE) SARS-CoV-2  target nucleic acids are NOT DETECTED.  The SARS-CoV-2 RNA is generally detectable in upper respiratory specimens during the acute phase of infection. The lowest concentration of SARS-CoV-2 viral copies this assay can detect is 138 copies/mL. A negative  result does not preclude SARS-Cov-2 infection and should not be used as the sole basis for treatment or other patient management decisions. A negative result may occur with  improper specimen collection/handling, submission of specimen other than nasopharyngeal swab, presence of viral mutation(s) within the areas targeted by this assay, and inadequate number of viral copies(<138 copies/mL). A negative result must be combined with clinical observations, patient history, and epidemiological information. The expected result is Negative.  Fact Sheet for Patients:  EntrepreneurPulse.com.au  Fact Sheet for Healthcare Providers:  IncredibleEmployment.be  This test is no t yet approved or cleared by the [place] FDA and  has been authorized for detection and/or diagnosis of SARS-CoV-2 by FDA under an Emergency Use Authorization (EUA). This EUA will remain  in effect (meaning this test can be used) for the duration of the COVID-19 declaration under Section 564(b)(1) of the Act, 21 U.S.C.section 360bbb-3(b)(1), unless the authorization is terminated  or revoked sooner.       Influenza A by PCR POSITIVE (A) NEGATIVE Final   Influenza B by PCR NEGATIVE NEGATIVE Final    Comment: (NOTE) The Xpert Xpress SARS-CoV-2/FLU/RSV plus assay is intended as an aid in the diagnosis of influenza from Nasopharyngeal swab specimens and should not be used as a sole basis for treatment. Nasal washings and aspirates are unacceptable for Xpert Xpress SARS-CoV-2/FLU/RSV testing.  Fact Sheet for Patients: EntrepreneurPulse.com.au  Fact Sheet for Healthcare Providers: IncredibleEmployment.be  This test is not yet approved or cleared by the [place] FDA and has been authorized for detection and/or diagnosis of SARS-CoV-2 by FDA under an Emergency Use Authorization (EUA). This EUA will remain in effect (meaning this test can be  used) for the duration of the COVID-19 declaration under Section 564(b)(1) of the Act, 21 U.S.C. section 360bbb-3(b)(1), unless the authorization is terminated or revoked.     Resp Syncytial Virus by PCR NEGATIVE NEGATIVE Final    Comment: (NOTE) Fact Sheet for Patients: EntrepreneurPulse.com.au  Fact Sheet for Healthcare Providers: IncredibleEmployment.be  This test is not yet approved or cleared by the [place] FDA and has been authorized for detection and/or diagnosis of SARS-CoV-2 by FDA under an Emergency Use Authorization (EUA). This EUA will remain in effect (meaning this test can be used) for the duration of the COVID-19 declaration under Section 564(b)(1) of the Act, 21 U.S.C. section 360bbb-3(b)(1), unless the authorization is terminated or revoked.  Performed at [facility], [place] [address], [place], [place] [address]   Urine Culture (for pregnant, neutropenic or urologic patients or patients with an indwelling urinary catheter)     Status: None   Collection Time: 08/16/22  3:23 PM   Specimen: In/Out Cath Urine  Result Value Ref Range Status   Specimen Description IN/OUT CATH URINE  Final   Special Requests NONE  Final   Culture   Final    NO GROWTH Performed at [facility], [place] [address], [place], [place],  [address]    Report Status 08/18/2022 FINAL  Final     Labs:   Basic Metabolic Panel: Recent Labs  Lab 08/17/22 0044 08/18/22 0055 08/19/22 0136 08/20/22 0539 08/22/22 0059  NA 136 135 137 136 137  K 3.7 3.6 4.4 4.0 3.7  CL 105 104  108 107 108  CO2 25 21* 22 21* 23  GLUCOSE 109* 117* 111* 101* 91  BUN '11 12 12 12 11  '$ CREATININE 1.40* 1.30* 1.20 1.25* 1.01  CALCIUM 8.4* 8.5* 8.4* 8.1* 7.9*  MG 1.3* 2.2 1.8 1.8 2.0   Liver Function Tests: Recent Labs  Lab 08/16/22 1344 08/18/22 0055 08/19/22 0136 08/20/22 0539  AST 32 43* 50* 50*  ALT '12 15 18 20  '$ ALKPHOS 122 122 114 110  BILITOT 0.8  0.5 0.7 1.0  PROT 6.8 7.6 7.0 6.6  ALBUMIN 2.4* 2.4* 2.2* 2.1*    CBC: Recent Labs  Lab 08/16/22 1228 08/17/22 0044 08/18/22 0055 08/19/22 0136 08/20/22 0539  WBC 6.7 6.6 3.9* 3.5* 5.2  NEUTROABS 5.2  --   --   --   --   HGB 12.9* 12.9* 13.6 15.0 13.2  HCT 40.2 40.0 43.5 47.3 41.3  MCV 86.3 84.2 85.3 84.8 85.2  PLT 188 174 178 116* 132*    CBG: Recent Labs  Lab 08/17/22 0026 08/17/22 0427 08/17/22 0810 08/17/22 1101 08/17/22 1536  GLUCAP 85 117* 136* 129* 108*     IMAGING STUDIES ECHOCARDIOGRAM LIMITED  Result Date: 08/19/2022    ECHOCARDIOGRAM LIMITED REPORT   Patient Name:   MAXIMILIEN HAYASHI Date of Exam: 08/19/2022 Medical Rec #:  [id number]           Height:       73.0 in Accession #:    [id number]          Weight:       227.5 lb Date of Birth:  08-Nov-1945           BSA:          2.273 m Patient Age:    18 years            BP:           142/67 mmHg Patient Gender: M                   HR:           66 bpm. Exam Location:  Inpatient Procedure: Limited Echo and Limited Color Doppler Indications:    Ventricular Tachycardia I47.2  History:        Patient has prior history of Echocardiogram examinations, most                 recent 07/06/2022. COPD and Stroke, Arrythmias:Atrial                 Fibrillation and Tachycardia; Risk Factors:Hypertension,                 Diabetes and Dyslipidemia.  Sonographer:    Ronny Flurry Referring Phys: [place]  1. Left ventricular ejection fraction, by estimation, is 70 to 75%. The left ventricle has hyperdynamic function. The left ventricle has no regional wall motion abnormalities. There is moderate concentric left ventricular hypertrophy.  2. Right ventricular systolic function is normal. The right ventricular size is normal. Tricuspid regurgitation signal is inadequate for assessing PA pressure.  3. Left atrial size was severely dilated.  4. Right atrial size was moderately dilated.  5. The mitral valve is grossly  normal. No evidence of mitral valve regurgitation. No evidence of mitral stenosis.  6. Aortic dilatation noted. There is mild dilatation of the aortic root, measuring 41 mm.  7. The inferior vena cava is dilated in size with >50% respiratory variability, suggesting right atrial pressure  of 8 mmHg. Comparison(s): No significant change from prior study. FINDINGS  Left Ventricle: Left ventricular ejection fraction, by estimation, is 70 to 75%. The left ventricle has hyperdynamic function. The left ventricle has no regional wall motion abnormalities. The left ventricular internal cavity size was normal in size. There is moderate concentric left ventricular hypertrophy. Right Ventricle: The right ventricular size is normal. No increase in right ventricular wall thickness. Right ventricular systolic function is normal. Tricuspid regurgitation signal is inadequate for assessing PA pressure. Left Atrium: Left atrial size was severely dilated. Right Atrium: Right atrial size was moderately dilated. Mitral Valve: The mitral valve is grossly normal. No evidence of mitral valve stenosis. Tricuspid Valve: The tricuspid valve is grossly normal. Tricuspid valve regurgitation is trivial. No evidence of tricuspid stenosis. Aorta: Aortic dilatation noted. There is mild dilatation of the aortic root, measuring 41 mm. Venous: The inferior vena cava is dilated in size with greater than 50% respiratory variability, suggesting right atrial pressure of 8 mmHg. Additional Comments: There is a small pleural effusion in the left lateral region.  LEFT VENTRICLE PLAX 2D LVIDd:         3.60 cm LVIDs:         2.30 cm LV PW:         1.40 cm LV IVS:        1.20 cm LVOT diam:     2.60 cm LVOT Area:     5.31 cm  IVC IVC diam: 2.45 cm LEFT ATRIUM         Index LA diam:    4.90 cm 2.16 cm/m   AORTA Ao Root diam: 4.10 cm Ao Asc diam:  3.60 cm  SHUNTS Systemic Diam: 2.60 cm Eleonore Chiquito MD Electronically signed by Eleonore Chiquito MD Signature Date/Time:  08/19/2022/2:13:37 PM    Final    DG Chest 2 View  Result Date: 08/16/2022 CLINICAL DATA:  Altered mental status EXAM: CHEST - 2 VIEW COMPARISON:  Radiograph July 04, 2022 FINDINGS: Enlarged cardiac silhouette. Aortic atherosclerosis. Similar appearance of the 2 focal consolidative opacities in the right mid lung previously characterized as loculated fluid on chest CT July 04, 2022. No new focal airspace consolidation unchanged elevation of the left hemidiaphragm. No pneumothorax. The visualized skeletal structures are unchanged. IMPRESSION: Similar appearance of the 2 focal consolidative opacities in the right mid lung previously characterized as loculated fluid on chest CT July 04, 2022. No new focal airspace consolidation. Electronically Signed   By: Dahlia Bailiff M.D.   On: 08/16/2022 13:19   CT Head Wo Contrast  Result Date: 08/16/2022 CLINICAL DATA:  Altered mental status.  Previous strokes. EXAM: CT HEAD WITHOUT CONTRAST TECHNIQUE: Contiguous axial images were obtained from the base of the skull through the vertex without intravenous contrast. RADIATION DOSE REDUCTION: This exam was performed according to the departmental dose-optimization program which includes automated exposure control, adjustment of the mA and/or kV according to patient size and/or use of iterative reconstruction technique. COMPARISON:  07/05/2022 FINDINGS: Brain: No evidence of intracranial hemorrhage, acute infarction, hydrocephalus, extra-axial collection, or mass lesion/mass effect. Old left parietooccipital infarct again seen. Severe chronic small vessel disease unchanged. Moderate cerebral atrophy and ventriculomegaly are also stable. Vascular:  No hyperdense vessel or other acute findings. Skull: No evidence of fracture or other significant bone abnormality. Sinuses/Orbits:  No acute findings. Other: None. IMPRESSION: No acute intracranial abnormality. Old left parietooccipital infarct, severe chronic small  vessel disease, and moderate cerebral atrophy. Electronically Signed   By:  Marlaine Hind M.D.   On: 08/16/2022 13:11    DISCHARGE EXAMINATION: Vitals:   08/23/22 0028 08/23/22 0518 08/23/22 0725 08/23/22 0739  BP: 129/61 126/76 (!) 141/60 135/76  Pulse: (!) 57 63 62 61  Resp: '18 18 18 16  '$ Temp: (!) 97.4 F (36.3 C) (!) 97 F (36.1 C) (!) 97.4 F (36.3 C) 97.6 F (36.4 C)  TempSrc: Oral Oral Oral Oral  SpO2: 95% 100% 100% 96%  Weight: 107.6 kg      General appearance: Awake alert.  In no distress Resp: Clear to auscultation bilaterally.  Normal effort Cardio: S1-S2 is normal regular.  No S3-S4.  No rubs murmurs or bruit GI: Abdomen is soft.  Nontender nondistended.  Bowel sounds are present normal.  No masses organomegaly   DISPOSITION: SNF  Discharge Instructions     Call MD for:  difficulty breathing, headache or visual disturbances   Complete by: As directed    Call MD for:  extreme fatigue   Complete by: As directed    Call MD for:  persistant dizziness or light-headedness   Complete by: As directed    Call MD for:  persistant nausea and vomiting   Complete by: As directed    Call MD for:  severe uncontrolled pain   Complete by: As directed    Call MD for:  temperature >100.4   Complete by: As directed    Diet - low sodium heart healthy   Complete by: As directed    Discharge instructions   Complete by: As directed    Please review instructions on the discharge summary.  You were cared for by a hospitalist during your hospital stay. If you have any questions about your discharge medications or the care you received while you were in the hospital after you are discharged, you can call the unit and asked to speak with the hospitalist on call if the hospitalist that took care of you is not available. Once you are discharged, your primary care physician will handle any further medical issues. Please note that NO REFILLS for any discharge medications will be authorized once  you are discharged, as it is imperative that you return to your primary care physician (or establish a relationship with a primary care physician if you do not have one) for your aftercare needs so that they can reassess your need for medications and monitor your lab values. If you do not have a primary care physician, you can call [phone] for a physician referral.   Increase activity slowly   Complete by: As directed           Allergies as of 08/23/2022       Reactions   Ciprofloxacin Other (See Comments)   All over weakness        Medication List     TAKE these medications    Accu-Chek Guide Me w/Device Kit Use as directed twice per day E11.9   Accu-Chek Guide test strip Generic drug: glucose blood Use as instructed twice per day E11.9   apixaban 5 MG Tabs tablet Commonly known as: ELIQUIS Take 1 tablet (5 mg total) by mouth 2 (two) times daily.   carvedilol 12.5 MG tablet Commonly known as: COREG Take 1 tablet (12.5 mg total) by mouth 2 (two) times daily with a meal. What changed:  medication strength how much to take   feeding supplement Liqd Take 237 mLs by mouth 3 (three) times daily between meals.   furosemide 40 MG  tablet Commonly known as: LASIX TAKE 1 TABLET BY MOUTH DAILY .  MAY TAKE AN ADDITIONAL 1 TABLET  BY MOUTH IF NEEDED FOR SWELLING What changed:  how much to take how to take this when to take this   guaiFENesin 600 MG 12 hr tablet Commonly known as: MUCINEX Take 1 tablet (600 mg total) by mouth 2 (two) times daily.   guaiFENesin 100 MG/5ML liquid Commonly known as: ROBITUSSIN Take 5 mLs by mouth every 4 (four) hours as needed for cough or to loosen phlegm.   magnesium oxide 400 (240 Mg) MG tablet Commonly known as: MAG-OX Take 1 tablet (400 mg total) by mouth 2 (two) times daily.   multivitamin with minerals Tabs tablet Take 1 tablet by mouth daily.   OneTouch Delica Plus [id number] Misc USE ONCE DAILY   OneTouch Delica Plus  Lancing Misc   polyethylene glycol 17 g packet Commonly known as: MIRALAX / GLYCOLAX Take 17 g by mouth daily as needed for mild constipation.   potassium chloride 10 MEQ tablet Commonly known as: KLOR-CON Take 2 tablets (20 mEq total) by mouth daily.   ProAir HFA 108 (90 Base) MCG/ACT inhaler Generic drug: albuterol INHALE 2 PUFFS INTO THE  LUNGS EVERY 6 HOURS AS  NEEDED What changed: reasons to take this   rosuvastatin 20 MG tablet Commonly known as: CRESTOR Take 20 mg by mouth every evening.   tamsulosin 0.4 MG Caps capsule Commonly known as: FLOMAX Take 1 capsule (0.4 mg total) by mouth daily after supper.          Follow-up Information     Biagio Borg, MD Follow up.   Specialties: Internal Medicine, Radiology Contact information: [place] [address] [phone]                 TOTAL DISCHARGE TIME: 15 minutes  [place]  [facility] Pager on [email or web address]  08/23/2022, 9:08 AM

## 2022-08-23 NOTE — TOC Progression Note (Signed)
Transition of Care [facility]) - Progression Note    Patient Details  Name: [name] MRN: [id number] Date of Birth: 1946/06/13  Transition of Care [facility]) CM/SW [place], LCSW Phone Number: 08/23/2022, 10:00 AM  Clinical Narrative:     CSW called [place] to discuss upcoming discharge. CSW had to leave message.  TOC team will continue to assist with discharge planning needs.   Expected Discharge Plan: [place] Barriers to Discharge: Continued Medical Work up  Expected Discharge Plan and [place] Choice: [place] Living arrangements for the past 2 months: [place] Expected Discharge Date: 08/23/22                         HH Arranged:  (Is active with [facility])           Social Determinants of Health (SDOH) Interventions SDOH Screenings   Food Insecurity: No Food Insecurity (07/10/2022)  Housing: Low Risk  (07/10/2022)  Transportation Needs: No Transportation Needs (07/10/2022)  Utilities: Not At Risk (07/05/2022)  Depression (PHQ2-9): Low Risk  (08/07/2022)  Tobacco Use: Medium Risk (08/16/2022)    Readmission Risk Interventions   No data to display

## 2022-08-23 NOTE — Plan of Care (Signed)

## 2022-08-23 NOTE — Progress Notes (Signed)
Report called to SNF spoke to [place]

## 2022-08-23 NOTE — TOC Transition Note (Signed)
Transition of Care [facility]) - CM/SW Discharge Note   Patient Details  Name: [name] MRN: [id number] Date of Birth: July 05, 1946  Transition of Care [facility]) CM/SW Contact:  Bary Castilla, LCSW Phone Number: 08/23/2022, 10:43 AM   Clinical Narrative:     Patient will DC to:?[facility] Anticipated DC date:?08/23/2022 Family notified:?Karena Transport by: Corey Harold   Per MD patient ready for DC to [facility]. RN, patient, patient's family, and facility notified of DC. Discharge Summary sent to facility. RN given number for report [phone] room 103 DC packet on chart. Ambulance transport requested for patient.   CSW signing off.   [place], [place] [phone]   Final next level of care: [place] Barriers to Discharge: Barriers Resolved   Patient Goals and CMS Choice      Discharge Placement                Patient chooses bed at: [facility] Patient to be transferred to facility by: [place] Name of family member notified: [name] Patient and family notified of of transfer: 08/23/22  Discharge Plan and Services Additional resources added to the After Visit Summary for       Post Acute Care Choice: [place] Arranged:  (Is active with [facility])          Social Determinants of Health ([place]) Interventions SDOH Screenings   Food Insecurity: No Food Insecurity (07/10/2022)  Housing: Low Risk  (07/10/2022)  Transportation Needs: No Transportation Needs (07/10/2022)  Utilities: Not At Risk (07/05/2022)  Depression (PHQ2-9): Low Risk  (08/07/2022)  Tobacco Use: Medium Risk (08/16/2022)     Readmission Risk Interventions   No data to display

## 2022-08-26 DIAGNOSIS — E1169 Type 2 diabetes mellitus with other specified complication: Secondary | ICD-10-CM | POA: Diagnosis not present

## 2022-08-26 DIAGNOSIS — Z86711 Personal history of pulmonary embolism: Secondary | ICD-10-CM | POA: Diagnosis not present

## 2022-08-26 DIAGNOSIS — J449 Chronic obstructive pulmonary disease, unspecified: Secondary | ICD-10-CM | POA: Diagnosis not present

## 2022-08-26 DIAGNOSIS — L89612 Pressure ulcer of right heel, stage 2: Secondary | ICD-10-CM | POA: Diagnosis not present

## 2022-08-26 DIAGNOSIS — F03A0 Unspecified dementia, mild, without behavioral disturbance, psychotic disturbance, mood disturbance, and anxiety: Secondary | ICD-10-CM | POA: Diagnosis not present

## 2022-08-26 DIAGNOSIS — K644 Residual hemorrhoidal skin tags: Secondary | ICD-10-CM | POA: Diagnosis not present

## 2022-08-26 DIAGNOSIS — E785 Hyperlipidemia, unspecified: Secondary | ICD-10-CM | POA: Diagnosis not present

## 2022-08-26 DIAGNOSIS — I890 Lymphedema, not elsewhere classified: Secondary | ICD-10-CM | POA: Diagnosis not present

## 2022-08-26 DIAGNOSIS — I4729 Other ventricular tachycardia: Secondary | ICD-10-CM | POA: Diagnosis not present

## 2022-08-26 DIAGNOSIS — I5032 Chronic diastolic (congestive) heart failure: Secondary | ICD-10-CM | POA: Diagnosis not present

## 2022-08-28 ENCOUNTER — Other Ambulatory Visit: Payer: Self-pay | Admitting: *Deleted

## 2022-08-28 NOTE — Patient Outreach (Signed)
[name] resides in [facility] Screening for potential [facility] care coordination services as benefit of health plan and Primary Care Provider.  Collaboration with [place], Glass blower/designer. [name]'s anticipated transition plan is to return home with daughter.   Will continue to follow and plan outreach to discuss potential [facility] care coordination needs.   Marthenia Rolling, MSN, RN,BSN [place] Acute Care Coordinator [phone] (Direct dial)

## 2022-08-30 DIAGNOSIS — I10 Essential (primary) hypertension: Secondary | ICD-10-CM | POA: Diagnosis not present

## 2022-09-01 DIAGNOSIS — R1312 Dysphagia, oropharyngeal phase: Secondary | ICD-10-CM | POA: Diagnosis not present

## 2022-09-01 DIAGNOSIS — R2681 Unsteadiness on feet: Secondary | ICD-10-CM | POA: Diagnosis not present

## 2022-09-01 DIAGNOSIS — R5381 Other malaise: Secondary | ICD-10-CM | POA: Diagnosis not present

## 2022-09-01 DIAGNOSIS — M6250 Muscle wasting and atrophy, not elsewhere classified, unspecified site: Secondary | ICD-10-CM | POA: Diagnosis not present

## 2022-09-03 DIAGNOSIS — Z86711 Personal history of pulmonary embolism: Secondary | ICD-10-CM | POA: Diagnosis not present

## 2022-09-03 DIAGNOSIS — R1312 Dysphagia, oropharyngeal phase: Secondary | ICD-10-CM | POA: Diagnosis not present

## 2022-09-03 DIAGNOSIS — M6250 Muscle wasting and atrophy, not elsewhere classified, unspecified site: Secondary | ICD-10-CM | POA: Diagnosis not present

## 2022-09-03 DIAGNOSIS — I5032 Chronic diastolic (congestive) heart failure: Secondary | ICD-10-CM | POA: Diagnosis not present

## 2022-09-03 DIAGNOSIS — R2681 Unsteadiness on feet: Secondary | ICD-10-CM | POA: Diagnosis not present

## 2022-09-03 DIAGNOSIS — Z7189 Other specified counseling: Secondary | ICD-10-CM | POA: Diagnosis not present

## 2022-09-03 DIAGNOSIS — J449 Chronic obstructive pulmonary disease, unspecified: Secondary | ICD-10-CM | POA: Diagnosis not present

## 2022-09-03 DIAGNOSIS — R5381 Other malaise: Secondary | ICD-10-CM | POA: Diagnosis not present

## 2022-09-05 DIAGNOSIS — I10 Essential (primary) hypertension: Secondary | ICD-10-CM | POA: Diagnosis not present

## 2022-09-05 DIAGNOSIS — N390 Urinary tract infection, site not specified: Secondary | ICD-10-CM | POA: Diagnosis not present

## 2022-09-06 DIAGNOSIS — I10 Essential (primary) hypertension: Secondary | ICD-10-CM | POA: Diagnosis not present

## 2022-09-08 DIAGNOSIS — M6250 Muscle wasting and atrophy, not elsewhere classified, unspecified site: Secondary | ICD-10-CM | POA: Diagnosis not present

## 2022-09-08 DIAGNOSIS — R1312 Dysphagia, oropharyngeal phase: Secondary | ICD-10-CM | POA: Diagnosis not present

## 2022-09-08 DIAGNOSIS — R410 Disorientation, unspecified: Secondary | ICD-10-CM | POA: Diagnosis not present

## 2022-09-08 DIAGNOSIS — R5381 Other malaise: Secondary | ICD-10-CM | POA: Diagnosis not present

## 2022-09-08 DIAGNOSIS — N390 Urinary tract infection, site not specified: Secondary | ICD-10-CM | POA: Diagnosis not present

## 2022-09-08 DIAGNOSIS — I2699 Other pulmonary embolism without acute cor pulmonale: Secondary | ICD-10-CM | POA: Diagnosis not present

## 2022-09-08 DIAGNOSIS — R2681 Unsteadiness on feet: Secondary | ICD-10-CM | POA: Diagnosis not present

## 2022-09-09 DIAGNOSIS — L97512 Non-pressure chronic ulcer of other part of right foot with fat layer exposed: Secondary | ICD-10-CM | POA: Diagnosis not present

## 2022-09-09 DIAGNOSIS — L97412 Non-pressure chronic ulcer of right heel and midfoot with fat layer exposed: Secondary | ICD-10-CM | POA: Diagnosis not present

## 2022-09-09 DIAGNOSIS — R8290 Unspecified abnormal findings in urine: Secondary | ICD-10-CM | POA: Diagnosis not present

## 2022-09-09 DIAGNOSIS — D649 Anemia, unspecified: Secondary | ICD-10-CM | POA: Diagnosis not present

## 2022-09-10 DIAGNOSIS — R1312 Dysphagia, oropharyngeal phase: Secondary | ICD-10-CM | POA: Diagnosis not present

## 2022-09-10 DIAGNOSIS — R52 Pain, unspecified: Secondary | ICD-10-CM | POA: Diagnosis not present

## 2022-09-10 DIAGNOSIS — M79604 Pain in right leg: Secondary | ICD-10-CM | POA: Diagnosis not present

## 2022-09-10 DIAGNOSIS — R5381 Other malaise: Secondary | ICD-10-CM | POA: Diagnosis not present

## 2022-09-10 DIAGNOSIS — M6250 Muscle wasting and atrophy, not elsewhere classified, unspecified site: Secondary | ICD-10-CM | POA: Diagnosis not present

## 2022-09-10 DIAGNOSIS — G8929 Other chronic pain: Secondary | ICD-10-CM | POA: Diagnosis not present

## 2022-09-10 DIAGNOSIS — L97509 Non-pressure chronic ulcer of other part of unspecified foot with unspecified severity: Secondary | ICD-10-CM | POA: Diagnosis not present

## 2022-09-10 DIAGNOSIS — R2681 Unsteadiness on feet: Secondary | ICD-10-CM | POA: Diagnosis not present

## 2022-09-10 DIAGNOSIS — I4891 Unspecified atrial fibrillation: Secondary | ICD-10-CM | POA: Diagnosis not present

## 2022-09-10 DIAGNOSIS — E11621 Type 2 diabetes mellitus with foot ulcer: Secondary | ICD-10-CM | POA: Diagnosis not present

## 2022-09-10 DIAGNOSIS — M549 Dorsalgia, unspecified: Secondary | ICD-10-CM | POA: Diagnosis not present

## 2022-09-11 DIAGNOSIS — F03B0 Unspecified dementia, moderate, without behavioral disturbance, psychotic disturbance, mood disturbance, and anxiety: Secondary | ICD-10-CM | POA: Diagnosis not present

## 2022-09-11 DIAGNOSIS — I10 Essential (primary) hypertension: Secondary | ICD-10-CM | POA: Diagnosis not present

## 2022-09-11 DIAGNOSIS — Z0189 Encounter for other specified special examinations: Secondary | ICD-10-CM | POA: Diagnosis not present

## 2022-09-15 ENCOUNTER — Ambulatory Visit: Payer: Medicare Other | Attending: Cardiology | Admitting: Cardiology

## 2022-09-15 ENCOUNTER — Encounter: Payer: Self-pay | Admitting: Cardiology

## 2022-09-15 VITALS — BP 120/70 | HR 75 | Ht 73.0 in

## 2022-09-15 DIAGNOSIS — I872 Venous insufficiency (chronic) (peripheral): Secondary | ICD-10-CM | POA: Diagnosis not present

## 2022-09-15 DIAGNOSIS — N1832 Chronic kidney disease, stage 3b: Secondary | ICD-10-CM | POA: Diagnosis not present

## 2022-09-15 DIAGNOSIS — Y92129 Unspecified place in nursing home as the place of occurrence of the external cause: Secondary | ICD-10-CM | POA: Diagnosis not present

## 2022-09-15 DIAGNOSIS — E1140 Type 2 diabetes mellitus with diabetic neuropathy, unspecified: Secondary | ICD-10-CM

## 2022-09-15 DIAGNOSIS — E785 Hyperlipidemia, unspecified: Secondary | ICD-10-CM | POA: Diagnosis not present

## 2022-09-15 DIAGNOSIS — I2699 Other pulmonary embolism without acute cor pulmonale: Secondary | ICD-10-CM

## 2022-09-15 DIAGNOSIS — S065XAA Traumatic subdural hemorrhage with loss of consciousness status unknown, initial encounter: Secondary | ICD-10-CM | POA: Diagnosis not present

## 2022-09-15 DIAGNOSIS — W19XXXA Unspecified fall, initial encounter: Secondary | ICD-10-CM | POA: Diagnosis not present

## 2022-09-15 DIAGNOSIS — I4821 Permanent atrial fibrillation: Secondary | ICD-10-CM | POA: Diagnosis not present

## 2022-09-15 DIAGNOSIS — I5032 Chronic diastolic (congestive) heart failure: Secondary | ICD-10-CM | POA: Diagnosis not present

## 2022-09-15 DIAGNOSIS — I110 Hypertensive heart disease with heart failure: Secondary | ICD-10-CM

## 2022-09-15 DIAGNOSIS — D6869 Other thrombophilia: Secondary | ICD-10-CM | POA: Diagnosis not present

## 2022-09-15 DIAGNOSIS — F03B0 Unspecified dementia, moderate, without behavioral disturbance, psychotic disturbance, mood disturbance, and anxiety: Secondary | ICD-10-CM | POA: Diagnosis not present

## 2022-09-15 NOTE — Patient Instructions (Addendum)
Medication Instructions:   No changes *If you need a refill on your cardiac medications before your next appointment, please call your pharmacy*   Lab Work:  Not needed   Testing/Procedures: Not needed   Follow-Up: At [facility], you and your health needs are our priority.  As part of our continuing mission to provide you with exceptional heart care, we have created designated Provider Care Teams.  These Care Teams include your primary Cardiologist (physician) and Advanced Practice Providers (APPs -  Physician Assistants and Nurse Practitioners) who all work together to provide you with the care you need, when you need it.     Your next appointment:   6 month(s)  The format for your next appointment:   In Person  Provider:   David [name], MD    

## 2022-09-15 NOTE — Progress Notes (Signed)
Primary Care Provider: Biagio Borg, MD [place] Cardiologist: Glenetta Hew, MD Electrophysiologist: None  Clinic Note: Chief Complaint  Patient presents with   Hospitalization Follow-up    82-monthfollow-up with has been in the hospital twice   Atrial Fibrillation    Pretty much asymptomatic but is back on DOAC because of recent PE   Leg Swelling    Mostly feet because he is sitting dependent.    ===================================  ASSESSMENT/PLAN   Problem List Items Addressed This Visit       Cardiology Problems   Venous insufficiency (chronic) (peripheral) with chronic lower extremity edema (Chronic)    Not been troubled by lower extremity wounds.  Still has pretty significant pedal edema.  Unfortunately he spends a lot of his day sitting in the wheelchair with his feet dependent.  Recommend foot elevation during the day when not working with PT or lying down.  Need to avoid prolonged periods of time when he is sitting with his feet dependent.  Once his wounds are fully healed, would consider Unaboot wraps versus assisting him to replace the support stockings provided the wounds are fully healed.      Permanent atrial fibrillation (HCC) - now off amiodarone; asymptomatic. CHA2DS2-VASc Score -5 (on Xarelto) (Chronic)    Essentially rate controlled A-fib with carvedilol.  Thankfully, he does not really notice being in A-fib as far as rapid irregular heartbeats.  It would be difficult to know whether the A-fib is contributing some to his dyspnea and edema since he has been in A-fib for several years now.  Elevated CHA2DS2-VASc score on Eliquis now safely following which had bleeding.      Relevant Orders   EKG 12-Lead (Completed)   Hypertensive heart disease with chronic diastolic congestive heart failure ([place] (Chronic)    His heart disease with the blood work to be.  Unfortunately with A-fib and unable to really tell [place] diastolic function.  Now he is  back on full dose carvedilol 12.5 mg twice daily and no longer on diltiazem.  Will defer to nephrology, but would like to see him potentially be have some afterload reduction with ARB if not to be safe. On stable dose of diuretic.  I think a lot of the edema is noncardiac/dependent edema.      Relevant Orders   EKG 12-Lead (Completed)   Hyperlipidemia with target LDL less than 100 (Chronic)    LDL target really is probably less than 100 but more closer to 70 or lower below because of his stroke.  Thankfully, on stable dose of rosuvastatin LDL is down to 57.  Well within goal.  He seems to be tolerating rosuvastatin.      Hypercoagulable state due to longstanding persistent atrial fibrillation (HCC) - Primary (Chronic)    CHA2DS2-VASc score now that he is age [age]is actually 546  6 if you include CHF but I do not think his edema is truly CHF related-more related to venous stasis.  He now also has a secondary reason for anticoagulation being recent PEs.  Difficult situation with recent SDH, but with now PE and A-fib and prior stroke, he definitely is safer on DOAC especially given how deconditioned he is with significant immobility..      Bilateral pulmonary embolism (HCC) (Chronic)    Very unfortunate situation but he certainly has the substrate for Virt-Caps triad to have a lower extremity DVT that would lead to PE especially if becomes more sedentary than he already  had been.  He had previously not had issues because he was on DOAC, but following his SDH, he was not on DOAC.  He has lower extremity venous stasis with an underlying hypercoagulability.  Now he is on Eliquis again for combination of PE and A-fib.  Likely, his breathing is improved        Other   SDH (subdural hematoma) (HCC) (Chronic)    Was reevaluated by neurosurgery when he was admitted for his UTI and PE.  Given clearance to restart DOAC.      Diabetes mellitus with neuropathy (HCC) (Chronic)   Chronic kidney  disease, stage 3b (HCC) (Chronic)    Most recent creatinine was just about 2.  He is due to follow-up with his nephrologist soon.      ===================================  HPI:    [name] is a 77 y.o. male with a PMH below who presents today for 95-monthhospital follow-up.  Chronic HFpEF, permanent atrial fibrillation, hyperlipidemia, type 2 diabetes mellitus, chronic venous insufficiency, COPD, CVA, subdural hematoma in 03/2022, and BPH who was admitted on 07/04/2022 for acute metabolic encephalopathy secondary to UTI after presenting with generalized weakness and confusion. Work-up also revealed acute PE.   Notable PMH: Persistent A-fib ->  Difficult Control/Labile Hypertension Chronic HFpEF-Hypertensive Heart Disease Chronic Lower Extremity Edema/Venous Hypertension COPD HLD DM-2 with PN CKD 3A-B History of left occipital CVA 2021 followed by TIA July 2023 Subdural Hematoma History of NSVT  [name] was last seen on June 03, 2022 for hospital follow-up of multiple hospitalizations and ER visits.  He had done relatively well up until July, then had multiple issues.  He appeared much more disabled and debilitated than previously.  Pretty much now wheelchair-bound.  Very unsteady and weak walking.  Barely able to use walker.  Swelling pretty much stable with good days and bad days occasionally taking extra Lasix about every other day.  No real signs of PND orthopnea.  Does not notice being in A-fib.  No change to baseline dyspnea.  No chest pain or pressure.  Notably reduced energy level.  But still working on PT and OT [facility] TIA.  Will still off Eliquis-pending reassessment by neurology and neurosurgery as to when they can restart it following SDH. CV Review of Symptoms (Summary): positive for - dyspnea on exertion, edema, orthopnea, and very limited exercise tolerance mostly because of poor unsteady gait, poor balance and extreme deconditioning. negative for -  chest pain, irregular heartbeat, palpitations, paroxysmal nocturnal dyspnea, rapid heart rate, shortness of breath, or syncope or near syncope, TIA or amaurosis fugax, not walking enough to note claudication Poor historian.  More so than usual malaise.  Weight loss. Chronic back joint and neck pain.   He as usual he is accompanied by his daughter Discontinue diltiazem.  Restarted carvedilol 3.125 mg twice daily.  Told him to discuss with neurology when he can restart Eliquis.  Recent Hospitalizations:  12/15-19/2023: Admitted for urinary symptoms and confusion.  Generalized weakness, confusion and agitation about 2 weeks prior.  Noted that he became more unsteady.  PCP diagnosed UTI on December 6.  Unfortunately, he had not started taking his antibiotics because of confusion and poor p.o. intake.  Was febrile on admission with urinalysis white blood cell count greater than 50,000.  Admitted for IV antibiotics and started on IV diuretic. = Found to have bilateral PE but no findings of right heart strain.  Neurosurgery indicated okay to start IV heparin.  Was restarted on Eliquis for discharge.  CT of the head on December 16 shows stable findings and no progression of hematoma.  Had intermittent episodes of asymptomatic VT likely related to hypokalemia.  Carvedilol increased to 6.25 mg twice daily by cardiology.  1/27-08/23/2022: Admitted for treatment of influenza A.  Treated with Tamiflu.  Daughter noted progressive cognitive impairment since the subdural hematoma.  Head CT was stable.  Dr. IV fluid treatment.  Still have recurrent episodes of SVT.  Was hypovolemic.  Furosemide held.  Reviewed  CV studies:    The following studies were reviewed today: (if available, images/films reviewed: From Epic Chart or Care Everywhere) Echocardiogram 07/06/2022> EF 65 to 70%.  Normal Fxn.  No RWMA.  Severe asymmetrical-septal LVH.  Unable to assess diastolic parameters.  Normal RV fxn, mildly increased thickness.   Severe LA and mild RA dilation with mildly elevated RAP.  (8 mmHg).  Relatively normal valves.  Echo 08/19/2022: LVEF 70 to 75%.  Hyperdynamic.  No RWMA.  Mild eccentric LVH.  Severe LA dilation.  Moderate RA dilation with normal RV size and function.  Normal valves.  RAP estimated 8 mmHg.  Interval History:   [name] returns today accompanied by his daughter who unfortunately is not as able to provide information because he has been at [facility] since his most recent discharge.   He is working on physical therapy quite a bit.  He has had lots of issues with leg and foot ulcers and currently has his feet in cushioning boots, and is essentially wheelchair-bound now.  He does get up and transfer some with PT but barely gets anywhere with a walker.  He now has mild swelling mostly in the feet more so than the lower legs.  But he basically is sitting dependent most days.  He likes to sleep in a recliner, but is still sleep in the bed as well.  No real true orthopnea or PND.  He is in A-fib but has no idea that he is in A-fib.  He denies any chest pain or pressure at rest or exertion but is not really exerting.  Edema seems to be stable.  He is taking 40 mg Lasix daily as well as the additional dose in the afternoon probably not as frequently since has been at the rehab center. Significant exercise tolerance, fatigue, generalized weakness.  As such, if he really exerts himself he will be short of breath. His breathing seems to be a lot better than it was when he had his initial PE evaluation.  He now is back on Eliquis and is not having any bleeding issues.  Was cleared by neurosurgery to be back on DOAC.  CV Review of Symptoms (Summary): positive for - dyspnea on exertion, edema, and generalized weakness and fatigue.  Very deconditioned and immobilized. negative for - chest pain, irregular heartbeat, orthopnea, palpitations, paroxysmal nocturnal dyspnea, rapid heart rate, shortness  of breath, or syncope or near syncope although he does sometimes get little bit wobbly from poor balance.  TIA or amaurosis fugax.  Claudication.-Does not walk enough for claudication.  REVIEWED OF SYSTEMS   Review of Systems  Constitutional:  Positive for malaise/fatigue. Negative for weight loss (Weight is stabilized now.  He is doing a better job with getting his nutrition and supplementation.).  HENT:  Negative for congestion and sinus pain.   Gastrointestinal:  Negative for blood in stool, diarrhea and melena.  Genitourinary:  Positive for dysuria (Not recently.  Not since discharge) and urgency. Negative for hematuria.  Musculoskeletal:  Positive for back pain, joint pain and neck pain. Negative for myalgias.  Neurological:  Positive for dizziness, focal weakness and weakness (Generalized).       Poor balance  Psychiatric/Behavioral:  Positive for memory loss. Negative for depression (He does not seem to be fully depressed, but certainly has down mood.). The patient is not nervous/anxious.     I have reviewed and (if needed) personally updated the patient's problem list, medications, allergies, past medical and surgical history, social and family history.   PAST MEDICAL HISTORY   Past Medical History:  Diagnosis Date   Adenoma 05/2008   Alcoholism in recovery [facility])    BPH (benign prostatic hyperplasia)    Chronic LBP    Hip & Back -- Sees Dr. Maia Petties @ [facility]   CKD (chronic kidney disease)    with AKI requiring brief CRRT 02/2020   Colon polyps    Controlled type 2 diabetes mellitus with neuropathy ([place]) 01/2007   COPD (chronic obstructive pulmonary disease) ([place])    Diverticulitis of colon    ED (erectile dysfunction)    s/p Penile prosthesis (09/2010)   History of prostate cancer    Dr. Karsten Ro   History of sick sinus syndrome    reduced BB dose for Bradycardia   HLD (hyperlipidemia)    Hypertension    Impaired glucose tolerance 12/21/2013    Nephrolithiasis    Osteoarthritis of both hips    PAF (paroxysmal atrial fibrillation) (HCC)    No longer on Amiodarone.  Not on Anticoaguation b/c no recurrence.   Peripheral neuropathy 12/21/2013   Stroke ([place])    left occipital CVA ~ 2018; possible TIA 01/24/22   Subdural hematoma ([place]) 04/04/2022   Anticoagulation stopped   Venous insufficiency    PAST SURGICAL HISTORY   Past Surgical History:  Procedure Laterality Date   IR ANGIO INTRA EXTRACRAN SEL COM CAROTID INNOMINATE UNI R MOD SED  04/17/2022   LEFT HEART CATH AND CORONARY ANGIOGRAPHY  2003   Normal Coronary Arteries.   NM MYOVIEW LTD  02/21/2020   EF 60%.  Medium sized mild severity defect in the basal inferior, mid inferior and apical inferior location-suggesting of ischemia.  Read as low-intermediate risk.  (On cardiology review this is felt to be a fixed defect either related to prior infarct versus diaphragmatic attenuation.  Felt to be low risk)   PENILE PROSTHESIS IMPLANT  06/21/2012   Procedure: PENILE PROTHESIS INFLATABLE;  Surgeon: Claybon Jabs, MD;  Location: [facility];  Service: Urology;  Laterality: N/A;  REMOVAL AND REPLACEMENT OF SOME  OF PROSTHESIS (AMS)    PENILE PROSTHESIS PLACEMENT  09/2010   PROSTATECTOMY     RADIOLOGY WITH ANESTHESIA N/A 04/17/2022   Procedure: Right middle meningeal artery embolization;  Surgeon: Consuella Lose, MD;  Location: [place];  Service: Radiology;  Laterality: N/A;   REMOVAL OF PENILE PROSTHESIS N/A 02/15/2018   Procedure: REMOVAL OF PENILE PROSTHESIS;  Surgeon: Kathie Rhodes, MD;  Location: [facility];  Service: Urology;  Laterality: N/A;   TRANSTHORACIC ECHOCARDIOGRAM  01/2022   EF 70-75%:Marland Kitchen  Hyperdynamic LV with no RWMA.  Mild LVH.  Severely dilated left atrium suggesting notable diastolic dysfunction, but unable to interpret.  Mildly enlarged RV with normal function.  Normal PAP despite moderate dilated RA and increased RAP/CVP.Marland Kitchen  No MS or MR.  No AAS or AI.   Aortic root measuring 42 mm with ascending aorta measuring 41 mm.   TRANSTHORACIC ECHOCARDIOGRAM  08/19/2022   a) 12/17/'23: EF 65 to 70%.  Normal Fxn.  No RWMA.  Severe asymmetrical-septal LVH.  Unable to assess diastolic parameters.  Normal RV fxn, mildly increased thickness.  Severe LA and mild RA dilation with mildly elevated RAP.  (8 mmHg).  Relatively normal valves.;; b) 1/'24: No change. EF 70-75%, Hyperdyamic. Severe LA & Mod RA dilation.  RAP 8 mmHg. Unable to assess LV CFxn    MEDICATIONS/ALLERGIES   Current Meds  Medication Sig   acetaminophen (TYLENOL) 500 MG tablet Take 500 mg by mouth in the morning and at bedtime.   apixaban (ELIQUIS) 5 MG TABS tablet Take 1 tablet (5 mg total) by mouth 2 (two) times daily.   carvedilol (COREG) 12.5 MG tablet Take 1 tablet (12.5 mg total) by mouth 2 (two) times daily with a meal.   furosemide (LASIX) 40 MG tablet TAKE 1 TABLET BY MOUTH DAILY .  MAY TAKE AN ADDITIONAL 1 TABLET  BY MOUTH IF NEEDED FOR SWELLING (Patient taking differently: Take 40 mg by mouth daily. TAKE 1 TABLET BY MOUTH DAILY .     guaiFENesin (ROBITUSSIN) 100 MG/5ML liquid Take 5 mLs by mouth every 4 (four) hours as needed for cough or to loosen phlegm.   magnesium oxide (MAG-OX) 400 (240 Mg) MG tablet Take 1 tablet (400 mg total) by mouth 2 (two) times daily. (Patient taking differently: Take 400 mg by mouth daily.)   Multiple Vitamin (MULTIVITAMIN WITH MINERALS) TABS tablet Take 1 tablet by mouth daily.   polyethylene glycol (MIRALAX / GLYCOLAX) 17 g packet Take 17 g by mouth daily as needed for mild constipation.   potassium chloride (KLOR-CON) 10 MEQ tablet Take 2 tablets (20 mEq total) by mouth daily.   rosuvastatin (CRESTOR) 20 MG tablet Take 20 mg by mouth every evening.   tamsulosin (FLOMAX) 0.4 MG CAPS capsule Take 1 capsule (0.4 mg total) by mouth daily after supper.   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 3 grown children. 4 GC.  Quit smoking ~20+ yrs ago.       Emergency Contact/Next of Kin/HPA is Nathanuel Jude -daughter    OBJCTIVE -PE, EKG, labs   Wt Readings from Last 3 Encounters:  08/23/22 237 lb 3.4 oz (107.6 kg)  07/05/22 227 lb 4.7 oz (103.1 kg)  06/25/22 225 lb (102.1 kg)    Physical Exam: BP 120/70 (BP Location: Left Arm, Patient Position: Sitting, Cuff Size: Normal)   Pulse 75   Ht '6\' 1"'$  (1.854 m)   BMI 31.30 kg/m  Physical Exam Constitutional:      General: He is not in acute distress.    Appearance: He is obese. He is ill-appearing (Chronically ill-appearing.  Much more weak and fatigued.  Wheelchair-bound). He is not toxic-appearing.  HENT:     Head:  Normocephalic and atraumatic.  Neck:     Vascular: No carotid bruit or JVD.  Cardiovascular:     Rate and Rhythm: Normal rate. Rhythm irregularly irregular.     Chest Wall: PMI is not displaced (Unable to palpate).     Pulses: Decreased pulses (Cannot palpate pedal pulses because of edema).     Heart sounds: S1 normal and S2 normal. Heart sounds are distant. Murmur (1/6 SEM LUSB) heard.     No friction rub.  Pulmonary:     Effort: Pulmonary effort is normal. No respiratory distress.     Breath sounds: Normal breath sounds. No wheezing, rhonchi or rales.  Musculoskeletal:        General: Swelling (Both feet are extremely swollen 3-4+, but the lower legs are only to 2+) present.     Cervical  back: Rigidity (Stable rigid neck with forward leaning kyphosis.  Unable side-to-side which is chronic) present.  Skin:    General: Skin is warm.  Neurological:     General: No focal deficit present.     Mental Status: He is oriented to person, place, and time.     Gait: Gait abnormal (Wheelchair-bound).  Psychiatric:        Thought Content: Thought content normal.     Comments: Seems little bit grouchy and frustrated but not to the extent that he was last visit.  Is frustrated about being so incapacitated.     Adult ECG Report  Rate: 75;  Rhythm: atrial fibrillation and LAFB/left axis deviation with possible inferior MI, age-indeterminate. ;   Narrative Interpretation: Stable  Recent Labs: Reviewed Lab Results  Component Value Date   CHOL 102 03/10/2022   HDL 29.10 (L) 03/10/2022   LDLCALC 57 03/10/2022   TRIG 77.0 03/10/2022   CHOLHDL 3 03/10/2022   Lab Results  Component Value Date   CREATININE 1.01 08/22/2022   BUN 11 08/22/2022   NA 137 08/22/2022   K 3.7 08/22/2022   CL 108 08/22/2022   CO2 23 08/22/2022      Latest Ref Rng & Units 08/20/2022    5:39 AM 08/19/2022    1:36 AM 08/18/2022   12:55 AM  CBC  WBC 4.0 - 10.5 K/uL 5.2  3.5  3.9   Hemoglobin 13.0 - 17.0 g/dL 13.2  15.0  13.6   Hematocrit 39.0 - 52.0 % 41.3  47.3  43.5   Platelets 150 - 400 K/uL 132  116  178     Lab Results  Component Value Date   HGBA1C 7.0 (H) 03/10/2022   Lab Results  Component Value Date   TSH 1.363 01/24/2022    ================================================== I spent a total of 34 minutes with the patient spent in direct patient consultation.  Additional time spent with chart review  / charting (studies, outside notes, etc): 29 min Total Time: 63 min  Current medicines are reviewed at length with the patient today.  (+/- concerns) N/A  Notice: This dictation was prepared with Dragon dictation along with smart phrase technology. Any transcriptional errors that result from  this process are unintentional and may not be corrected upon review.  Studies Ordered:   Orders Placed This Encounter  Procedures   EKG 12-Lead   No orders of the defined types were placed in this encounter.   Patient Instructions / Medication Changes & Studies & Tests Ordered   Patient Instructions  Medication Instructions:   No changes *If you need a refill on your cardiac medications before your next  appointment, please call your pharmacy*   Lab Work: Not needed    Testing/Procedures: Not needed   Follow-Up: At [facility], you and your health needs are our priority.  As part of our continuing mission to provide you with exceptional heart care, we have created designated Provider Care Teams.  These Care Teams include your primary Cardiologist (physician) and Advanced Practice Providers (APPs -  Physician Assistants and Nurse Practitioners) who all work together to provide you with the care you need, when you need it.     Your next appointment:   6 month(s)  The format for your next appointment:   In Person  Provider:   Glenetta Hew, MD      Leonie Man, MD, MS Glenetta Hew, M.D., M.S. Interventional Cardiologist  [place]  Pager # [phone] Phone # [phone] [address]. [place], [place] [address]   Thank you for choosing [facility]!!

## 2022-09-16 DIAGNOSIS — I4891 Unspecified atrial fibrillation: Secondary | ICD-10-CM | POA: Diagnosis not present

## 2022-09-16 DIAGNOSIS — E785 Hyperlipidemia, unspecified: Secondary | ICD-10-CM | POA: Diagnosis not present

## 2022-09-16 DIAGNOSIS — L97412 Non-pressure chronic ulcer of right heel and midfoot with fat layer exposed: Secondary | ICD-10-CM | POA: Diagnosis not present

## 2022-09-16 DIAGNOSIS — L89612 Pressure ulcer of right heel, stage 2: Secondary | ICD-10-CM | POA: Diagnosis not present

## 2022-09-16 DIAGNOSIS — I5032 Chronic diastolic (congestive) heart failure: Secondary | ICD-10-CM | POA: Diagnosis not present

## 2022-09-16 DIAGNOSIS — I152 Hypertension secondary to endocrine disorders: Secondary | ICD-10-CM | POA: Diagnosis not present

## 2022-09-16 DIAGNOSIS — L97512 Non-pressure chronic ulcer of other part of right foot with fat layer exposed: Secondary | ICD-10-CM | POA: Diagnosis not present

## 2022-09-16 DIAGNOSIS — I890 Lymphedema, not elsewhere classified: Secondary | ICD-10-CM | POA: Diagnosis not present

## 2022-09-16 DIAGNOSIS — E1140 Type 2 diabetes mellitus with diabetic neuropathy, unspecified: Secondary | ICD-10-CM | POA: Diagnosis not present

## 2022-09-16 DIAGNOSIS — J449 Chronic obstructive pulmonary disease, unspecified: Secondary | ICD-10-CM | POA: Diagnosis not present

## 2022-09-17 DIAGNOSIS — R5381 Other malaise: Secondary | ICD-10-CM | POA: Diagnosis not present

## 2022-09-17 DIAGNOSIS — R2681 Unsteadiness on feet: Secondary | ICD-10-CM | POA: Diagnosis not present

## 2022-09-17 DIAGNOSIS — M6250 Muscle wasting and atrophy, not elsewhere classified, unspecified site: Secondary | ICD-10-CM | POA: Diagnosis not present

## 2022-09-17 DIAGNOSIS — R1312 Dysphagia, oropharyngeal phase: Secondary | ICD-10-CM | POA: Diagnosis not present

## 2022-09-17 DIAGNOSIS — I10 Essential (primary) hypertension: Secondary | ICD-10-CM | POA: Diagnosis not present

## 2022-09-17 DIAGNOSIS — B9689 Other specified bacterial agents as the cause of diseases classified elsewhere: Secondary | ICD-10-CM | POA: Diagnosis not present

## 2022-09-17 DIAGNOSIS — R52 Pain, unspecified: Secondary | ICD-10-CM | POA: Diagnosis not present

## 2022-09-19 DIAGNOSIS — Z8631 Personal history of diabetic foot ulcer: Secondary | ICD-10-CM | POA: Diagnosis not present

## 2022-09-19 DIAGNOSIS — R52 Pain, unspecified: Secondary | ICD-10-CM | POA: Diagnosis not present

## 2022-09-19 DIAGNOSIS — R2681 Unsteadiness on feet: Secondary | ICD-10-CM | POA: Diagnosis not present

## 2022-09-19 DIAGNOSIS — R2689 Other abnormalities of gait and mobility: Secondary | ICD-10-CM | POA: Diagnosis not present

## 2022-09-19 DIAGNOSIS — L97412 Non-pressure chronic ulcer of right heel and midfoot with fat layer exposed: Secondary | ICD-10-CM | POA: Diagnosis not present

## 2022-09-19 DIAGNOSIS — M79671 Pain in right foot: Secondary | ICD-10-CM | POA: Diagnosis not present

## 2022-09-19 DIAGNOSIS — M6250 Muscle wasting and atrophy, not elsewhere classified, unspecified site: Secondary | ICD-10-CM | POA: Diagnosis not present

## 2022-09-19 DIAGNOSIS — L97512 Non-pressure chronic ulcer of other part of right foot with fat layer exposed: Secondary | ICD-10-CM | POA: Diagnosis not present

## 2022-09-19 DIAGNOSIS — M6281 Muscle weakness (generalized): Secondary | ICD-10-CM | POA: Diagnosis not present

## 2022-09-19 DIAGNOSIS — R5381 Other malaise: Secondary | ICD-10-CM | POA: Diagnosis not present

## 2022-09-19 DIAGNOSIS — R1311 Dysphagia, oral phase: Secondary | ICD-10-CM | POA: Diagnosis not present

## 2022-09-19 DIAGNOSIS — R1312 Dysphagia, oropharyngeal phase: Secondary | ICD-10-CM | POA: Diagnosis not present

## 2022-09-19 DIAGNOSIS — D649 Anemia, unspecified: Secondary | ICD-10-CM | POA: Diagnosis not present

## 2022-09-19 DIAGNOSIS — I2699 Other pulmonary embolism without acute cor pulmonale: Secondary | ICD-10-CM | POA: Diagnosis not present

## 2022-09-19 DIAGNOSIS — M79674 Pain in right toe(s): Secondary | ICD-10-CM | POA: Diagnosis not present

## 2022-09-19 DIAGNOSIS — E785 Hyperlipidemia, unspecified: Secondary | ICD-10-CM | POA: Diagnosis not present

## 2022-09-19 DIAGNOSIS — R410 Disorientation, unspecified: Secondary | ICD-10-CM | POA: Diagnosis not present

## 2022-09-19 DIAGNOSIS — N390 Urinary tract infection, site not specified: Secondary | ICD-10-CM | POA: Diagnosis not present

## 2022-09-19 DIAGNOSIS — B351 Tinea unguium: Secondary | ICD-10-CM | POA: Diagnosis not present

## 2022-09-19 DIAGNOSIS — M79675 Pain in left toe(s): Secondary | ICD-10-CM | POA: Diagnosis not present

## 2022-09-20 ENCOUNTER — Encounter: Payer: Self-pay | Admitting: Cardiology

## 2022-09-20 NOTE — Assessment & Plan Note (Signed)
Was reevaluated by neurosurgery when he was admitted for his UTI and PE.  Given clearance to restart DOAC.

## 2022-09-20 NOTE — Assessment & Plan Note (Signed)
Very unfortunate situation but he certainly has the substrate for Virt-Caps triad to have a lower extremity DVT that would lead to PE especially if becomes more sedentary than he already had been.  He had previously not had issues because he was on DOAC, but following his SDH, he was not on DOAC.  He has lower extremity venous stasis with an underlying hypercoagulability.  Now he is on Eliquis again for combination of PE and A-fib.  Likely, his breathing is improved

## 2022-09-20 NOTE — Assessment & Plan Note (Signed)
Not been troubled by lower extremity wounds.  Still has pretty significant pedal edema.  Unfortunately he spends a lot of his day sitting in the wheelchair with his feet dependent.  Recommend foot elevation during the day when not working with PT or lying down.  Need to avoid prolonged periods of time when he is sitting with his feet dependent.  Once his wounds are fully healed, would consider Unaboot wraps versus assisting him to replace the support stockings provided the wounds are fully healed.

## 2022-09-20 NOTE — Assessment & Plan Note (Signed)
Most recent creatinine was just about 2.  He is due to follow-up with his nephrologist soon.

## 2022-09-20 NOTE — Assessment & Plan Note (Signed)
His heart disease with the blood work to be.  Unfortunately with A-fib and unable to really tell us to diastolic function.  Now he is back on full dose carvedilol 12.5 mg twice daily and no longer on diltiazem.  Will defer to nephrology, but would like to see him potentially be have some afterload reduction with ARB if not to be safe. On stable dose of diuretic.  I think a lot of the edema is noncardiac/dependent edema.

## 2022-09-20 NOTE — Assessment & Plan Note (Signed)
CHA2DS2-VASc score now that he is age 77 is actually 5.  6 if you include CHF but I do not think his edema is truly CHF related-more related to venous stasis.  He now also has a secondary reason for anticoagulation being recent PEs.  Difficult situation with recent SDH, but with now PE and A-fib and prior stroke, he definitely is safer on DOAC especially given how deconditioned he is with significant immobility.Marland Kitchen

## 2022-09-20 NOTE — Assessment & Plan Note (Signed)
LDL target really is probably less than 100 but more closer to 70 or lower below because of his stroke.  Thankfully, on stable dose of rosuvastatin LDL is down to 57.  Well within goal.  He seems to be tolerating rosuvastatin.

## 2022-09-20 NOTE — Assessment & Plan Note (Signed)
Essentially rate controlled A-fib with carvedilol.  Thankfully, he does not really notice being in A-fib as far as rapid irregular heartbeats.  It would be difficult to know whether the A-fib is contributing some to his dyspnea and edema since he has been in A-fib for several years now.  Elevated CHA2DS2-VASc score on Eliquis now safely following which had bleeding.

## 2022-09-22 DIAGNOSIS — R2681 Unsteadiness on feet: Secondary | ICD-10-CM | POA: Diagnosis not present

## 2022-09-22 DIAGNOSIS — M6250 Muscle wasting and atrophy, not elsewhere classified, unspecified site: Secondary | ICD-10-CM | POA: Diagnosis not present

## 2022-09-22 DIAGNOSIS — R5381 Other malaise: Secondary | ICD-10-CM | POA: Diagnosis not present

## 2022-09-22 DIAGNOSIS — R1312 Dysphagia, oropharyngeal phase: Secondary | ICD-10-CM | POA: Diagnosis not present

## 2022-09-22 DIAGNOSIS — R52 Pain, unspecified: Secondary | ICD-10-CM | POA: Diagnosis not present

## 2022-09-24 DIAGNOSIS — R5381 Other malaise: Secondary | ICD-10-CM | POA: Diagnosis not present

## 2022-09-24 DIAGNOSIS — R52 Pain, unspecified: Secondary | ICD-10-CM | POA: Diagnosis not present

## 2022-09-24 DIAGNOSIS — R2681 Unsteadiness on feet: Secondary | ICD-10-CM | POA: Diagnosis not present

## 2022-09-24 DIAGNOSIS — R1312 Dysphagia, oropharyngeal phase: Secondary | ICD-10-CM | POA: Diagnosis not present

## 2022-09-24 DIAGNOSIS — M6250 Muscle wasting and atrophy, not elsewhere classified, unspecified site: Secondary | ICD-10-CM | POA: Diagnosis not present

## 2022-09-26 ENCOUNTER — Other Ambulatory Visit: Payer: Self-pay | Admitting: *Deleted

## 2022-09-26 NOTE — Patient Outreach (Signed)
Late entry for 09/25/22. Mr. Tredway resides in [facility]. Screening for potential [place] care coordination services as benefit of health plan and PCP.   Update received from [place], Glass blower/designer. Plan remains to return home with daughter. Currently in appeal process. Being treated for heel infection. Staff uses hoyer lift.   Will continue to follow and plan outreach as appropriate.    Marthenia Rolling, MSN, RN,BSN [place] Acute Care Coordinator [phone] (Direct dial)

## 2022-09-30 DIAGNOSIS — L97412 Non-pressure chronic ulcer of right heel and midfoot with fat layer exposed: Secondary | ICD-10-CM | POA: Diagnosis not present

## 2022-09-30 DIAGNOSIS — L97512 Non-pressure chronic ulcer of other part of right foot with fat layer exposed: Secondary | ICD-10-CM | POA: Diagnosis not present

## 2022-10-01 DIAGNOSIS — M6250 Muscle wasting and atrophy, not elsewhere classified, unspecified site: Secondary | ICD-10-CM | POA: Diagnosis not present

## 2022-10-01 DIAGNOSIS — R5381 Other malaise: Secondary | ICD-10-CM | POA: Diagnosis not present

## 2022-10-01 DIAGNOSIS — R52 Pain, unspecified: Secondary | ICD-10-CM | POA: Diagnosis not present

## 2022-10-01 DIAGNOSIS — R2681 Unsteadiness on feet: Secondary | ICD-10-CM | POA: Diagnosis not present

## 2022-10-01 DIAGNOSIS — R1312 Dysphagia, oropharyngeal phase: Secondary | ICD-10-CM | POA: Diagnosis not present

## 2022-10-06 ENCOUNTER — Ambulatory Visit (INDEPENDENT_AMBULATORY_CARE_PROVIDER_SITE_OTHER): Payer: Medicare Other | Admitting: Podiatry

## 2022-10-06 ENCOUNTER — Encounter: Payer: Self-pay | Admitting: Podiatry

## 2022-10-06 DIAGNOSIS — B351 Tinea unguium: Secondary | ICD-10-CM

## 2022-10-06 DIAGNOSIS — E1140 Type 2 diabetes mellitus with diabetic neuropathy, unspecified: Secondary | ICD-10-CM

## 2022-10-06 DIAGNOSIS — M79674 Pain in right toe(s): Secondary | ICD-10-CM

## 2022-10-06 DIAGNOSIS — M79675 Pain in left toe(s): Secondary | ICD-10-CM | POA: Diagnosis not present

## 2022-10-06 DIAGNOSIS — E119 Type 2 diabetes mellitus without complications: Secondary | ICD-10-CM

## 2022-10-06 NOTE — Progress Notes (Signed)
This patient returns to my office for at risk foot care.  This patient requires this care by a professional since this patient will be at risk due to having diabetic neuropathy.  This patient is unable to cut nails himself since the patient cannot reach his nails.These nails are painful walking and wearing shoes. This patient presents to the office with his daughter in a wheelchair. This patient presents for at risk foot care today.  General Appearance  Alert, conversant and in no acute stress.  Vascular  deferred  due to casts  Neurologic  defereed due to casts.  Nails Thick disfigured discolored nails with subungual debris  from hallux to fifth toes bilaterally. No evidence of bacterial infection or drainage bilaterally.  Orthopedic  No limitations of motion  feet .  No crepitus or effusions noted.  No bony pathology or digital deformities noted.  Skin  normotropic skin with no porokeratosis noted bilaterally.  No signs of infections or ulcers noted.     Onychomycosis  Pain in right toes  Pain in left toes  Consent was obtained for treatment procedures.   Mechanical debridement of nails 1-5  bilaterally performed with a nail nipper.  Filed with dremel without incident.    Return office visit     3 months                 Told patient to return for periodic foot care and evaluation due to potential at risk complications.   Gardiner Barefoot DPM

## 2022-10-07 DIAGNOSIS — I2699 Other pulmonary embolism without acute cor pulmonale: Secondary | ICD-10-CM | POA: Diagnosis not present

## 2022-10-07 DIAGNOSIS — L97512 Non-pressure chronic ulcer of other part of right foot with fat layer exposed: Secondary | ICD-10-CM | POA: Diagnosis not present

## 2022-10-07 DIAGNOSIS — L97412 Non-pressure chronic ulcer of right heel and midfoot with fat layer exposed: Secondary | ICD-10-CM | POA: Diagnosis not present

## 2022-10-07 DIAGNOSIS — N390 Urinary tract infection, site not specified: Secondary | ICD-10-CM | POA: Diagnosis not present

## 2022-10-07 DIAGNOSIS — R410 Disorientation, unspecified: Secondary | ICD-10-CM | POA: Diagnosis not present

## 2022-10-13 DIAGNOSIS — R2681 Unsteadiness on feet: Secondary | ICD-10-CM | POA: Diagnosis not present

## 2022-10-13 DIAGNOSIS — M6250 Muscle wasting and atrophy, not elsewhere classified, unspecified site: Secondary | ICD-10-CM | POA: Diagnosis not present

## 2022-10-13 DIAGNOSIS — R5381 Other malaise: Secondary | ICD-10-CM | POA: Diagnosis not present

## 2022-10-13 DIAGNOSIS — R1312 Dysphagia, oropharyngeal phase: Secondary | ICD-10-CM | POA: Diagnosis not present

## 2022-10-13 DIAGNOSIS — R52 Pain, unspecified: Secondary | ICD-10-CM | POA: Diagnosis not present

## 2022-10-15 DIAGNOSIS — Z8631 Personal history of diabetic foot ulcer: Secondary | ICD-10-CM | POA: Diagnosis not present

## 2022-10-15 DIAGNOSIS — R2681 Unsteadiness on feet: Secondary | ICD-10-CM | POA: Diagnosis not present

## 2022-10-15 DIAGNOSIS — R5381 Other malaise: Secondary | ICD-10-CM | POA: Diagnosis not present

## 2022-10-15 DIAGNOSIS — M6250 Muscle wasting and atrophy, not elsewhere classified, unspecified site: Secondary | ICD-10-CM | POA: Diagnosis not present

## 2022-10-15 DIAGNOSIS — R1312 Dysphagia, oropharyngeal phase: Secondary | ICD-10-CM | POA: Diagnosis not present

## 2022-10-15 DIAGNOSIS — R52 Pain, unspecified: Secondary | ICD-10-CM | POA: Diagnosis not present

## 2022-10-20 DIAGNOSIS — M6281 Muscle weakness (generalized): Secondary | ICD-10-CM | POA: Diagnosis not present

## 2022-10-20 DIAGNOSIS — R2689 Other abnormalities of gait and mobility: Secondary | ICD-10-CM | POA: Diagnosis not present

## 2022-10-21 DIAGNOSIS — R2689 Other abnormalities of gait and mobility: Secondary | ICD-10-CM | POA: Diagnosis not present

## 2022-10-21 DIAGNOSIS — E11621 Type 2 diabetes mellitus with foot ulcer: Secondary | ICD-10-CM | POA: Diagnosis not present

## 2022-10-21 DIAGNOSIS — L97509 Non-pressure chronic ulcer of other part of unspecified foot with unspecified severity: Secondary | ICD-10-CM | POA: Diagnosis not present

## 2022-10-21 DIAGNOSIS — L97412 Non-pressure chronic ulcer of right heel and midfoot with fat layer exposed: Secondary | ICD-10-CM | POA: Diagnosis not present

## 2022-10-21 DIAGNOSIS — L97512 Non-pressure chronic ulcer of other part of right foot with fat layer exposed: Secondary | ICD-10-CM | POA: Diagnosis not present

## 2022-10-21 DIAGNOSIS — M6281 Muscle weakness (generalized): Secondary | ICD-10-CM | POA: Diagnosis not present

## 2022-10-21 DIAGNOSIS — L89612 Pressure ulcer of right heel, stage 2: Secondary | ICD-10-CM | POA: Diagnosis not present

## 2022-10-22 DIAGNOSIS — M6250 Muscle wasting and atrophy, not elsewhere classified, unspecified site: Secondary | ICD-10-CM | POA: Diagnosis not present

## 2022-10-22 DIAGNOSIS — R5381 Other malaise: Secondary | ICD-10-CM | POA: Diagnosis not present

## 2022-10-22 DIAGNOSIS — H539 Unspecified visual disturbance: Secondary | ICD-10-CM | POA: Diagnosis not present

## 2022-10-22 DIAGNOSIS — Z8631 Personal history of diabetic foot ulcer: Secondary | ICD-10-CM | POA: Diagnosis not present

## 2022-10-22 DIAGNOSIS — K0889 Other specified disorders of teeth and supporting structures: Secondary | ICD-10-CM | POA: Diagnosis not present

## 2022-10-22 DIAGNOSIS — I890 Lymphedema, not elsewhere classified: Secondary | ICD-10-CM | POA: Diagnosis not present

## 2022-10-22 DIAGNOSIS — I4729 Other ventricular tachycardia: Secondary | ICD-10-CM | POA: Diagnosis not present

## 2022-10-22 DIAGNOSIS — R2689 Other abnormalities of gait and mobility: Secondary | ICD-10-CM | POA: Diagnosis not present

## 2022-10-22 DIAGNOSIS — M6281 Muscle weakness (generalized): Secondary | ICD-10-CM | POA: Diagnosis not present

## 2022-10-22 DIAGNOSIS — F03A0 Unspecified dementia, mild, without behavioral disturbance, psychotic disturbance, mood disturbance, and anxiety: Secondary | ICD-10-CM | POA: Diagnosis not present

## 2022-10-22 DIAGNOSIS — I5032 Chronic diastolic (congestive) heart failure: Secondary | ICD-10-CM | POA: Diagnosis not present

## 2022-10-22 DIAGNOSIS — E1159 Type 2 diabetes mellitus with other circulatory complications: Secondary | ICD-10-CM | POA: Diagnosis not present

## 2022-10-22 DIAGNOSIS — R2681 Unsteadiness on feet: Secondary | ICD-10-CM | POA: Diagnosis not present

## 2022-10-22 DIAGNOSIS — E785 Hyperlipidemia, unspecified: Secondary | ICD-10-CM | POA: Diagnosis not present

## 2022-10-22 DIAGNOSIS — J449 Chronic obstructive pulmonary disease, unspecified: Secondary | ICD-10-CM | POA: Diagnosis not present

## 2022-10-22 DIAGNOSIS — E11621 Type 2 diabetes mellitus with foot ulcer: Secondary | ICD-10-CM | POA: Diagnosis not present

## 2022-10-22 DIAGNOSIS — R52 Pain, unspecified: Secondary | ICD-10-CM | POA: Diagnosis not present

## 2022-10-22 DIAGNOSIS — R1312 Dysphagia, oropharyngeal phase: Secondary | ICD-10-CM | POA: Diagnosis not present

## 2022-10-23 ENCOUNTER — Other Ambulatory Visit: Payer: Self-pay | Admitting: *Deleted

## 2022-10-23 DIAGNOSIS — M6281 Muscle weakness (generalized): Secondary | ICD-10-CM | POA: Diagnosis not present

## 2022-10-23 DIAGNOSIS — R2689 Other abnormalities of gait and mobility: Secondary | ICD-10-CM | POA: Diagnosis not present

## 2022-10-23 NOTE — Patient Outreach (Signed)
[place] Coordinator follow up. Mr. Hoskinson resides in [facility]. Screening for potential [facility] care coordination services as benefit of health plan and PCP.   Collaboration with Systems analyst and therapy manger. Transition plan remains to be home. Currently in second level appeal. Wound care clinic will be arranged. Heel is healing since antibiotics. Lives with daughter.   Will continue to follow. Will plan outreach to daughter as appropriate.    Marthenia Rolling, MSN, RN,BSN [place] Acute Care Coordinator [phone] (Direct dial)

## 2022-10-24 DIAGNOSIS — R2689 Other abnormalities of gait and mobility: Secondary | ICD-10-CM | POA: Diagnosis not present

## 2022-10-24 DIAGNOSIS — M6281 Muscle weakness (generalized): Secondary | ICD-10-CM | POA: Diagnosis not present

## 2022-10-27 ENCOUNTER — Other Ambulatory Visit (HOSPITAL_COMMUNITY): Payer: Self-pay | Admitting: Family Medicine

## 2022-10-27 DIAGNOSIS — M86171 Other acute osteomyelitis, right ankle and foot: Secondary | ICD-10-CM

## 2022-10-27 DIAGNOSIS — R2689 Other abnormalities of gait and mobility: Secondary | ICD-10-CM | POA: Diagnosis not present

## 2022-10-27 DIAGNOSIS — M6281 Muscle weakness (generalized): Secondary | ICD-10-CM | POA: Diagnosis not present

## 2022-10-28 DIAGNOSIS — L97512 Non-pressure chronic ulcer of other part of right foot with fat layer exposed: Secondary | ICD-10-CM | POA: Diagnosis not present

## 2022-10-28 DIAGNOSIS — L97412 Non-pressure chronic ulcer of right heel and midfoot with fat layer exposed: Secondary | ICD-10-CM | POA: Diagnosis not present

## 2022-10-29 DIAGNOSIS — R2689 Other abnormalities of gait and mobility: Secondary | ICD-10-CM | POA: Diagnosis not present

## 2022-10-29 DIAGNOSIS — M6281 Muscle weakness (generalized): Secondary | ICD-10-CM | POA: Diagnosis not present

## 2022-10-30 DIAGNOSIS — R2689 Other abnormalities of gait and mobility: Secondary | ICD-10-CM | POA: Diagnosis not present

## 2022-10-30 DIAGNOSIS — N390 Urinary tract infection, site not specified: Secondary | ICD-10-CM | POA: Diagnosis not present

## 2022-10-30 DIAGNOSIS — M6281 Muscle weakness (generalized): Secondary | ICD-10-CM | POA: Diagnosis not present

## 2022-10-31 DIAGNOSIS — R2689 Other abnormalities of gait and mobility: Secondary | ICD-10-CM | POA: Diagnosis not present

## 2022-10-31 DIAGNOSIS — M6281 Muscle weakness (generalized): Secondary | ICD-10-CM | POA: Diagnosis not present

## 2022-11-03 DIAGNOSIS — R2689 Other abnormalities of gait and mobility: Secondary | ICD-10-CM | POA: Diagnosis not present

## 2022-11-03 DIAGNOSIS — M6281 Muscle weakness (generalized): Secondary | ICD-10-CM | POA: Diagnosis not present

## 2022-11-04 DIAGNOSIS — L97512 Non-pressure chronic ulcer of other part of right foot with fat layer exposed: Secondary | ICD-10-CM | POA: Diagnosis not present

## 2022-11-04 DIAGNOSIS — L97412 Non-pressure chronic ulcer of right heel and midfoot with fat layer exposed: Secondary | ICD-10-CM | POA: Diagnosis not present

## 2022-11-04 DIAGNOSIS — R2689 Other abnormalities of gait and mobility: Secondary | ICD-10-CM | POA: Diagnosis not present

## 2022-11-04 DIAGNOSIS — M6281 Muscle weakness (generalized): Secondary | ICD-10-CM | POA: Diagnosis not present

## 2022-11-05 ENCOUNTER — Encounter (HOSPITAL_COMMUNITY): Payer: Self-pay

## 2022-11-05 ENCOUNTER — Ambulatory Visit (HOSPITAL_COMMUNITY)
Admission: RE | Admit: 2022-11-05 | Discharge: 2022-11-05 | Disposition: A | Discharge status: Home / Self Care | Payer: Medicare Other | Source: Ambulatory Visit | Attending: Family Medicine | Admitting: Family Medicine

## 2022-11-05 DIAGNOSIS — M86171 Other acute osteomyelitis, right ankle and foot: Secondary | ICD-10-CM

## 2022-11-05 DIAGNOSIS — R2689 Other abnormalities of gait and mobility: Secondary | ICD-10-CM | POA: Diagnosis not present

## 2022-11-05 DIAGNOSIS — M6281 Muscle weakness (generalized): Secondary | ICD-10-CM | POA: Diagnosis not present

## 2022-11-05 NOTE — Progress Notes (Signed)
Attempted pt for MRI, pt is unable to lay flat enough , pt states he cannot tolerate, pt may need to be done at [facility] under [place] for MRI if MD still wants scan  MD office called and made aware

## 2022-11-06 DIAGNOSIS — R2689 Other abnormalities of gait and mobility: Secondary | ICD-10-CM | POA: Diagnosis not present

## 2022-11-06 DIAGNOSIS — M6281 Muscle weakness (generalized): Secondary | ICD-10-CM | POA: Diagnosis not present

## 2022-11-07 DIAGNOSIS — M6281 Muscle weakness (generalized): Secondary | ICD-10-CM | POA: Diagnosis not present

## 2022-11-07 DIAGNOSIS — N390 Urinary tract infection, site not specified: Secondary | ICD-10-CM | POA: Diagnosis not present

## 2022-11-07 DIAGNOSIS — R2689 Other abnormalities of gait and mobility: Secondary | ICD-10-CM | POA: Diagnosis not present

## 2022-11-07 DIAGNOSIS — R410 Disorientation, unspecified: Secondary | ICD-10-CM | POA: Diagnosis not present

## 2022-11-07 DIAGNOSIS — I2699 Other pulmonary embolism without acute cor pulmonale: Secondary | ICD-10-CM | POA: Diagnosis not present

## 2022-11-11 DIAGNOSIS — R2689 Other abnormalities of gait and mobility: Secondary | ICD-10-CM | POA: Diagnosis not present

## 2022-11-11 DIAGNOSIS — I152 Hypertension secondary to endocrine disorders: Secondary | ICD-10-CM | POA: Diagnosis not present

## 2022-11-11 DIAGNOSIS — M6281 Muscle weakness (generalized): Secondary | ICD-10-CM | POA: Diagnosis not present

## 2022-11-11 DIAGNOSIS — E1159 Type 2 diabetes mellitus with other circulatory complications: Secondary | ICD-10-CM | POA: Diagnosis not present

## 2022-11-11 DIAGNOSIS — B965 Pseudomonas (aeruginosa) (mallei) (pseudomallei) as the cause of diseases classified elsewhere: Secondary | ICD-10-CM | POA: Diagnosis not present

## 2022-11-11 DIAGNOSIS — R0981 Nasal congestion: Secondary | ICD-10-CM | POA: Diagnosis not present

## 2022-11-11 DIAGNOSIS — I4891 Unspecified atrial fibrillation: Secondary | ICD-10-CM | POA: Diagnosis not present

## 2022-11-11 DIAGNOSIS — L97412 Non-pressure chronic ulcer of right heel and midfoot with fat layer exposed: Secondary | ICD-10-CM | POA: Diagnosis not present

## 2022-11-11 DIAGNOSIS — I890 Lymphedema, not elsewhere classified: Secondary | ICD-10-CM | POA: Diagnosis not present

## 2022-11-11 DIAGNOSIS — L97512 Non-pressure chronic ulcer of other part of right foot with fat layer exposed: Secondary | ICD-10-CM | POA: Diagnosis not present

## 2022-11-12 DIAGNOSIS — I10 Essential (primary) hypertension: Secondary | ICD-10-CM | POA: Diagnosis not present

## 2022-11-12 DIAGNOSIS — M6281 Muscle weakness (generalized): Secondary | ICD-10-CM | POA: Diagnosis not present

## 2022-11-12 DIAGNOSIS — R2689 Other abnormalities of gait and mobility: Secondary | ICD-10-CM | POA: Diagnosis not present

## 2022-11-13 DIAGNOSIS — E11621 Type 2 diabetes mellitus with foot ulcer: Secondary | ICD-10-CM | POA: Diagnosis not present

## 2022-11-13 DIAGNOSIS — R2689 Other abnormalities of gait and mobility: Secondary | ICD-10-CM | POA: Diagnosis not present

## 2022-11-13 DIAGNOSIS — M6281 Muscle weakness (generalized): Secondary | ICD-10-CM | POA: Diagnosis not present

## 2022-11-14 DIAGNOSIS — R2689 Other abnormalities of gait and mobility: Secondary | ICD-10-CM | POA: Diagnosis not present

## 2022-11-14 DIAGNOSIS — M6281 Muscle weakness (generalized): Secondary | ICD-10-CM | POA: Diagnosis not present

## 2022-11-17 DIAGNOSIS — L97509 Non-pressure chronic ulcer of other part of unspecified foot with unspecified severity: Secondary | ICD-10-CM | POA: Diagnosis not present

## 2022-11-17 DIAGNOSIS — R2689 Other abnormalities of gait and mobility: Secondary | ICD-10-CM | POA: Diagnosis not present

## 2022-11-17 DIAGNOSIS — Z86711 Personal history of pulmonary embolism: Secondary | ICD-10-CM | POA: Diagnosis not present

## 2022-11-17 DIAGNOSIS — E11621 Type 2 diabetes mellitus with foot ulcer: Secondary | ICD-10-CM | POA: Diagnosis not present

## 2022-11-17 DIAGNOSIS — I5032 Chronic diastolic (congestive) heart failure: Secondary | ICD-10-CM | POA: Diagnosis not present

## 2022-11-17 DIAGNOSIS — M6281 Muscle weakness (generalized): Secondary | ICD-10-CM | POA: Diagnosis not present

## 2022-11-17 DIAGNOSIS — I4891 Unspecified atrial fibrillation: Secondary | ICD-10-CM | POA: Diagnosis not present

## 2022-11-17 DIAGNOSIS — J449 Chronic obstructive pulmonary disease, unspecified: Secondary | ICD-10-CM | POA: Diagnosis not present

## 2022-11-17 DIAGNOSIS — E785 Hyperlipidemia, unspecified: Secondary | ICD-10-CM | POA: Diagnosis not present

## 2022-11-17 DIAGNOSIS — F03B0 Unspecified dementia, moderate, without behavioral disturbance, psychotic disturbance, mood disturbance, and anxiety: Secondary | ICD-10-CM | POA: Diagnosis not present

## 2022-11-17 DIAGNOSIS — I4729 Other ventricular tachycardia: Secondary | ICD-10-CM | POA: Diagnosis not present

## 2022-11-17 DIAGNOSIS — I110 Hypertensive heart disease with heart failure: Secondary | ICD-10-CM | POA: Diagnosis not present

## 2022-11-17 DIAGNOSIS — I890 Lymphedema, not elsewhere classified: Secondary | ICD-10-CM | POA: Diagnosis not present

## 2022-11-18 DIAGNOSIS — M6281 Muscle weakness (generalized): Secondary | ICD-10-CM | POA: Diagnosis not present

## 2022-11-18 DIAGNOSIS — R2689 Other abnormalities of gait and mobility: Secondary | ICD-10-CM | POA: Diagnosis not present

## 2022-11-18 DIAGNOSIS — L97512 Non-pressure chronic ulcer of other part of right foot with fat layer exposed: Secondary | ICD-10-CM | POA: Diagnosis not present

## 2022-11-18 DIAGNOSIS — L97412 Non-pressure chronic ulcer of right heel and midfoot with fat layer exposed: Secondary | ICD-10-CM | POA: Diagnosis not present

## 2022-11-19 DIAGNOSIS — M6281 Muscle weakness (generalized): Secondary | ICD-10-CM | POA: Diagnosis not present

## 2022-11-20 ENCOUNTER — Other Ambulatory Visit (HOSPITAL_COMMUNITY): Payer: Self-pay | Admitting: Adult Health

## 2022-11-20 DIAGNOSIS — M86171 Other acute osteomyelitis, right ankle and foot: Secondary | ICD-10-CM

## 2022-11-20 DIAGNOSIS — M6281 Muscle weakness (generalized): Secondary | ICD-10-CM | POA: Diagnosis not present

## 2022-11-21 DIAGNOSIS — M6281 Muscle weakness (generalized): Secondary | ICD-10-CM | POA: Diagnosis not present

## 2022-11-24 DIAGNOSIS — N390 Urinary tract infection, site not specified: Secondary | ICD-10-CM | POA: Diagnosis not present

## 2022-11-24 DIAGNOSIS — E119 Type 2 diabetes mellitus without complications: Secondary | ICD-10-CM | POA: Diagnosis not present

## 2022-11-24 DIAGNOSIS — M6281 Muscle weakness (generalized): Secondary | ICD-10-CM | POA: Diagnosis not present

## 2022-11-25 DIAGNOSIS — E559 Vitamin D deficiency, unspecified: Secondary | ICD-10-CM | POA: Diagnosis not present

## 2022-11-25 DIAGNOSIS — I10 Essential (primary) hypertension: Secondary | ICD-10-CM | POA: Diagnosis not present

## 2022-11-25 DIAGNOSIS — L97412 Non-pressure chronic ulcer of right heel and midfoot with fat layer exposed: Secondary | ICD-10-CM | POA: Diagnosis not present

## 2022-11-25 DIAGNOSIS — M6281 Muscle weakness (generalized): Secondary | ICD-10-CM | POA: Diagnosis not present

## 2022-11-25 DIAGNOSIS — L97512 Non-pressure chronic ulcer of other part of right foot with fat layer exposed: Secondary | ICD-10-CM | POA: Diagnosis not present

## 2022-11-26 DIAGNOSIS — E876 Hypokalemia: Secondary | ICD-10-CM | POA: Diagnosis not present

## 2022-11-26 DIAGNOSIS — L89612 Pressure ulcer of right heel, stage 2: Secondary | ICD-10-CM | POA: Diagnosis not present

## 2022-11-26 DIAGNOSIS — D649 Anemia, unspecified: Secondary | ICD-10-CM | POA: Diagnosis not present

## 2022-11-26 DIAGNOSIS — E559 Vitamin D deficiency, unspecified: Secondary | ICD-10-CM | POA: Diagnosis not present

## 2022-11-26 DIAGNOSIS — E11621 Type 2 diabetes mellitus with foot ulcer: Secondary | ICD-10-CM | POA: Diagnosis not present

## 2022-11-27 DIAGNOSIS — D649 Anemia, unspecified: Secondary | ICD-10-CM | POA: Diagnosis not present

## 2022-12-02 DIAGNOSIS — L97412 Non-pressure chronic ulcer of right heel and midfoot with fat layer exposed: Secondary | ICD-10-CM | POA: Diagnosis not present

## 2022-12-02 DIAGNOSIS — L97512 Non-pressure chronic ulcer of other part of right foot with fat layer exposed: Secondary | ICD-10-CM | POA: Diagnosis not present

## 2022-12-04 DIAGNOSIS — I890 Lymphedema, not elsewhere classified: Secondary | ICD-10-CM | POA: Diagnosis not present

## 2022-12-04 DIAGNOSIS — I110 Hypertensive heart disease with heart failure: Secondary | ICD-10-CM | POA: Diagnosis not present

## 2022-12-04 DIAGNOSIS — I5032 Chronic diastolic (congestive) heart failure: Secondary | ICD-10-CM | POA: Diagnosis not present

## 2022-12-07 DIAGNOSIS — R410 Disorientation, unspecified: Secondary | ICD-10-CM | POA: Diagnosis not present

## 2022-12-07 DIAGNOSIS — I2699 Other pulmonary embolism without acute cor pulmonale: Secondary | ICD-10-CM | POA: Diagnosis not present

## 2022-12-07 DIAGNOSIS — N390 Urinary tract infection, site not specified: Secondary | ICD-10-CM | POA: Diagnosis not present

## 2022-12-09 DIAGNOSIS — L97412 Non-pressure chronic ulcer of right heel and midfoot with fat layer exposed: Secondary | ICD-10-CM | POA: Diagnosis not present

## 2022-12-09 DIAGNOSIS — L97512 Non-pressure chronic ulcer of other part of right foot with fat layer exposed: Secondary | ICD-10-CM | POA: Diagnosis not present

## 2022-12-11 ENCOUNTER — Ambulatory Visit (HOSPITAL_COMMUNITY)
Admission: RE | Admit: 2022-12-11 | Discharge: 2022-12-11 | Disposition: A | Discharge status: Home / Self Care | Payer: Medicare Other | Source: Ambulatory Visit | Attending: Adult Health | Admitting: Adult Health

## 2022-12-11 DIAGNOSIS — I10 Essential (primary) hypertension: Secondary | ICD-10-CM | POA: Diagnosis not present

## 2022-12-11 DIAGNOSIS — M86171 Other acute osteomyelitis, right ankle and foot: Secondary | ICD-10-CM | POA: Insufficient documentation

## 2022-12-11 DIAGNOSIS — I739 Peripheral vascular disease, unspecified: Secondary | ICD-10-CM | POA: Diagnosis not present

## 2022-12-11 DIAGNOSIS — L603 Nail dystrophy: Secondary | ICD-10-CM | POA: Diagnosis not present

## 2022-12-11 DIAGNOSIS — L97519 Non-pressure chronic ulcer of other part of right foot with unspecified severity: Secondary | ICD-10-CM | POA: Diagnosis not present

## 2022-12-11 MED ORDER — IOHEXOL 300 MG/ML  SOLN
100.0000 mL | Freq: Once | INTRAMUSCULAR | Status: AC | PRN
  Administered 2022-12-11: 100 mL via INTRAVENOUS

## 2022-12-16 DIAGNOSIS — L97412 Non-pressure chronic ulcer of right heel and midfoot with fat layer exposed: Secondary | ICD-10-CM | POA: Diagnosis not present

## 2022-12-16 DIAGNOSIS — L97512 Non-pressure chronic ulcer of other part of right foot with fat layer exposed: Secondary | ICD-10-CM | POA: Diagnosis not present

## 2022-12-16 LAB — POCT I-STAT CREATININE: Creatinine, Ser: 1.4 mg/dL — ABNORMAL HIGH (ref 0.61–1.24)

## 2022-12-17 DIAGNOSIS — E11621 Type 2 diabetes mellitus with foot ulcer: Secondary | ICD-10-CM | POA: Diagnosis not present

## 2022-12-23 DIAGNOSIS — L97412 Non-pressure chronic ulcer of right heel and midfoot with fat layer exposed: Secondary | ICD-10-CM | POA: Diagnosis not present

## 2022-12-23 DIAGNOSIS — L97512 Non-pressure chronic ulcer of other part of right foot with fat layer exposed: Secondary | ICD-10-CM | POA: Diagnosis not present

## 2022-12-29 DIAGNOSIS — L03031 Cellulitis of right toe: Secondary | ICD-10-CM | POA: Diagnosis not present

## 2022-12-29 DIAGNOSIS — I890 Lymphedema, not elsewhere classified: Secondary | ICD-10-CM | POA: Diagnosis not present

## 2022-12-29 DIAGNOSIS — E1140 Type 2 diabetes mellitus with diabetic neuropathy, unspecified: Secondary | ICD-10-CM | POA: Diagnosis not present

## 2023-01-06 DIAGNOSIS — E11621 Type 2 diabetes mellitus with foot ulcer: Secondary | ICD-10-CM | POA: Diagnosis not present

## 2023-01-06 DIAGNOSIS — E785 Hyperlipidemia, unspecified: Secondary | ICD-10-CM | POA: Diagnosis not present

## 2023-01-06 DIAGNOSIS — H25813 Combined forms of age-related cataract, bilateral: Secondary | ICD-10-CM | POA: Diagnosis not present

## 2023-01-06 DIAGNOSIS — I4891 Unspecified atrial fibrillation: Secondary | ICD-10-CM | POA: Diagnosis not present

## 2023-01-06 DIAGNOSIS — L97412 Non-pressure chronic ulcer of right heel and midfoot with fat layer exposed: Secondary | ICD-10-CM | POA: Diagnosis not present

## 2023-01-06 DIAGNOSIS — Z7901 Long term (current) use of anticoagulants: Secondary | ICD-10-CM | POA: Diagnosis not present

## 2023-01-06 DIAGNOSIS — K649 Unspecified hemorrhoids: Secondary | ICD-10-CM | POA: Diagnosis not present

## 2023-01-06 DIAGNOSIS — F03A0 Unspecified dementia, mild, without behavioral disturbance, psychotic disturbance, mood disturbance, and anxiety: Secondary | ICD-10-CM | POA: Diagnosis not present

## 2023-01-06 DIAGNOSIS — I152 Hypertension secondary to endocrine disorders: Secondary | ICD-10-CM | POA: Diagnosis not present

## 2023-01-06 DIAGNOSIS — L97512 Non-pressure chronic ulcer of other part of right foot with fat layer exposed: Secondary | ICD-10-CM | POA: Diagnosis not present

## 2023-01-06 DIAGNOSIS — E1159 Type 2 diabetes mellitus with other circulatory complications: Secondary | ICD-10-CM | POA: Diagnosis not present

## 2023-01-06 DIAGNOSIS — L97519 Non-pressure chronic ulcer of other part of right foot with unspecified severity: Secondary | ICD-10-CM | POA: Diagnosis not present

## 2023-01-06 DIAGNOSIS — I890 Lymphedema, not elsewhere classified: Secondary | ICD-10-CM | POA: Diagnosis not present

## 2023-01-07 DIAGNOSIS — N390 Urinary tract infection, site not specified: Secondary | ICD-10-CM | POA: Diagnosis not present

## 2023-01-07 DIAGNOSIS — R410 Disorientation, unspecified: Secondary | ICD-10-CM | POA: Diagnosis not present

## 2023-01-07 DIAGNOSIS — I2699 Other pulmonary embolism without acute cor pulmonale: Secondary | ICD-10-CM | POA: Diagnosis not present

## 2023-01-13 DIAGNOSIS — L97512 Non-pressure chronic ulcer of other part of right foot with fat layer exposed: Secondary | ICD-10-CM | POA: Diagnosis not present

## 2023-01-14 DIAGNOSIS — I890 Lymphedema, not elsewhere classified: Secondary | ICD-10-CM | POA: Diagnosis not present

## 2023-01-14 DIAGNOSIS — L97509 Non-pressure chronic ulcer of other part of unspecified foot with unspecified severity: Secondary | ICD-10-CM | POA: Diagnosis not present

## 2023-01-14 DIAGNOSIS — J449 Chronic obstructive pulmonary disease, unspecified: Secondary | ICD-10-CM | POA: Diagnosis not present

## 2023-01-14 DIAGNOSIS — K0889 Other specified disorders of teeth and supporting structures: Secondary | ICD-10-CM | POA: Diagnosis not present

## 2023-01-14 DIAGNOSIS — L97519 Non-pressure chronic ulcer of other part of right foot with unspecified severity: Secondary | ICD-10-CM | POA: Diagnosis not present

## 2023-01-14 DIAGNOSIS — E11621 Type 2 diabetes mellitus with foot ulcer: Secondary | ICD-10-CM | POA: Diagnosis not present

## 2023-01-14 DIAGNOSIS — Z8673 Personal history of transient ischemic attack (TIA), and cerebral infarction without residual deficits: Secondary | ICD-10-CM | POA: Diagnosis not present

## 2023-01-14 DIAGNOSIS — D6859 Other primary thrombophilia: Secondary | ICD-10-CM | POA: Diagnosis not present

## 2023-01-14 DIAGNOSIS — E785 Hyperlipidemia, unspecified: Secondary | ICD-10-CM | POA: Diagnosis not present

## 2023-01-14 DIAGNOSIS — I4729 Other ventricular tachycardia: Secondary | ICD-10-CM | POA: Diagnosis not present

## 2023-01-14 DIAGNOSIS — I482 Chronic atrial fibrillation, unspecified: Secondary | ICD-10-CM | POA: Diagnosis not present

## 2023-01-14 DIAGNOSIS — K644 Residual hemorrhoidal skin tags: Secondary | ICD-10-CM | POA: Diagnosis not present

## 2023-01-14 DIAGNOSIS — Z86711 Personal history of pulmonary embolism: Secondary | ICD-10-CM | POA: Diagnosis not present

## 2023-01-14 DIAGNOSIS — I5032 Chronic diastolic (congestive) heart failure: Secondary | ICD-10-CM | POA: Diagnosis not present

## 2023-01-14 DIAGNOSIS — Z7901 Long term (current) use of anticoagulants: Secondary | ICD-10-CM | POA: Diagnosis not present

## 2023-01-20 DIAGNOSIS — L97512 Non-pressure chronic ulcer of other part of right foot with fat layer exposed: Secondary | ICD-10-CM | POA: Diagnosis not present

## 2023-01-26 DIAGNOSIS — M6250 Muscle wasting and atrophy, not elsewhere classified, unspecified site: Secondary | ICD-10-CM | POA: Diagnosis not present

## 2023-01-26 DIAGNOSIS — M6281 Muscle weakness (generalized): Secondary | ICD-10-CM | POA: Diagnosis not present

## 2023-01-26 DIAGNOSIS — N390 Urinary tract infection, site not specified: Secondary | ICD-10-CM | POA: Diagnosis not present

## 2023-01-26 DIAGNOSIS — R2689 Other abnormalities of gait and mobility: Secondary | ICD-10-CM | POA: Diagnosis not present

## 2023-01-27 DIAGNOSIS — L97512 Non-pressure chronic ulcer of other part of right foot with fat layer exposed: Secondary | ICD-10-CM | POA: Diagnosis not present

## 2023-01-27 DIAGNOSIS — R2689 Other abnormalities of gait and mobility: Secondary | ICD-10-CM | POA: Diagnosis not present

## 2023-01-27 DIAGNOSIS — M6281 Muscle weakness (generalized): Secondary | ICD-10-CM | POA: Diagnosis not present

## 2023-01-27 DIAGNOSIS — M6250 Muscle wasting and atrophy, not elsewhere classified, unspecified site: Secondary | ICD-10-CM | POA: Diagnosis not present

## 2023-01-28 DIAGNOSIS — M6250 Muscle wasting and atrophy, not elsewhere classified, unspecified site: Secondary | ICD-10-CM | POA: Diagnosis not present

## 2023-01-28 DIAGNOSIS — N390 Urinary tract infection, site not specified: Secondary | ICD-10-CM | POA: Diagnosis not present

## 2023-01-28 DIAGNOSIS — M6281 Muscle weakness (generalized): Secondary | ICD-10-CM | POA: Diagnosis not present

## 2023-01-28 DIAGNOSIS — R2689 Other abnormalities of gait and mobility: Secondary | ICD-10-CM | POA: Diagnosis not present

## 2023-01-28 DIAGNOSIS — F03A11 Unspecified dementia, mild, with agitation: Secondary | ICD-10-CM | POA: Diagnosis not present

## 2023-01-28 DIAGNOSIS — Z881 Allergy status to other antibiotic agents status: Secondary | ICD-10-CM | POA: Diagnosis not present

## 2023-01-28 DIAGNOSIS — N183 Chronic kidney disease, stage 3 unspecified: Secondary | ICD-10-CM | POA: Diagnosis not present

## 2023-01-28 DIAGNOSIS — B952 Enterococcus as the cause of diseases classified elsewhere: Secondary | ICD-10-CM | POA: Diagnosis not present

## 2023-01-29 DIAGNOSIS — R2689 Other abnormalities of gait and mobility: Secondary | ICD-10-CM | POA: Diagnosis not present

## 2023-01-29 DIAGNOSIS — M6250 Muscle wasting and atrophy, not elsewhere classified, unspecified site: Secondary | ICD-10-CM | POA: Diagnosis not present

## 2023-01-29 DIAGNOSIS — M6281 Muscle weakness (generalized): Secondary | ICD-10-CM | POA: Diagnosis not present

## 2023-01-30 DIAGNOSIS — M6250 Muscle wasting and atrophy, not elsewhere classified, unspecified site: Secondary | ICD-10-CM | POA: Diagnosis not present

## 2023-01-30 DIAGNOSIS — R2689 Other abnormalities of gait and mobility: Secondary | ICD-10-CM | POA: Diagnosis not present

## 2023-01-30 DIAGNOSIS — M6281 Muscle weakness (generalized): Secondary | ICD-10-CM | POA: Diagnosis not present

## 2023-02-02 DIAGNOSIS — M6250 Muscle wasting and atrophy, not elsewhere classified, unspecified site: Secondary | ICD-10-CM | POA: Diagnosis not present

## 2023-02-02 DIAGNOSIS — R2689 Other abnormalities of gait and mobility: Secondary | ICD-10-CM | POA: Diagnosis not present

## 2023-02-02 DIAGNOSIS — M6281 Muscle weakness (generalized): Secondary | ICD-10-CM | POA: Diagnosis not present

## 2023-02-03 DIAGNOSIS — M6281 Muscle weakness (generalized): Secondary | ICD-10-CM | POA: Diagnosis not present

## 2023-02-03 DIAGNOSIS — R2689 Other abnormalities of gait and mobility: Secondary | ICD-10-CM | POA: Diagnosis not present

## 2023-02-03 DIAGNOSIS — M6250 Muscle wasting and atrophy, not elsewhere classified, unspecified site: Secondary | ICD-10-CM | POA: Diagnosis not present

## 2023-02-03 DIAGNOSIS — L97512 Non-pressure chronic ulcer of other part of right foot with fat layer exposed: Secondary | ICD-10-CM | POA: Diagnosis not present

## 2023-02-04 DIAGNOSIS — M6281 Muscle weakness (generalized): Secondary | ICD-10-CM | POA: Diagnosis not present

## 2023-02-04 DIAGNOSIS — R2689 Other abnormalities of gait and mobility: Secondary | ICD-10-CM | POA: Diagnosis not present

## 2023-02-04 DIAGNOSIS — M6250 Muscle wasting and atrophy, not elsewhere classified, unspecified site: Secondary | ICD-10-CM | POA: Diagnosis not present

## 2023-02-05 DIAGNOSIS — M6281 Muscle weakness (generalized): Secondary | ICD-10-CM | POA: Diagnosis not present

## 2023-02-05 DIAGNOSIS — R2689 Other abnormalities of gait and mobility: Secondary | ICD-10-CM | POA: Diagnosis not present

## 2023-02-05 DIAGNOSIS — M6250 Muscle wasting and atrophy, not elsewhere classified, unspecified site: Secondary | ICD-10-CM | POA: Diagnosis not present

## 2023-02-06 DIAGNOSIS — R410 Disorientation, unspecified: Secondary | ICD-10-CM | POA: Diagnosis not present

## 2023-02-06 DIAGNOSIS — I2699 Other pulmonary embolism without acute cor pulmonale: Secondary | ICD-10-CM | POA: Diagnosis not present

## 2023-02-06 DIAGNOSIS — R2689 Other abnormalities of gait and mobility: Secondary | ICD-10-CM | POA: Diagnosis not present

## 2023-02-06 DIAGNOSIS — M6250 Muscle wasting and atrophy, not elsewhere classified, unspecified site: Secondary | ICD-10-CM | POA: Diagnosis not present

## 2023-02-06 DIAGNOSIS — N390 Urinary tract infection, site not specified: Secondary | ICD-10-CM | POA: Diagnosis not present

## 2023-02-06 DIAGNOSIS — M6281 Muscle weakness (generalized): Secondary | ICD-10-CM | POA: Diagnosis not present

## 2023-02-07 DIAGNOSIS — M6250 Muscle wasting and atrophy, not elsewhere classified, unspecified site: Secondary | ICD-10-CM | POA: Diagnosis not present

## 2023-02-07 DIAGNOSIS — R2689 Other abnormalities of gait and mobility: Secondary | ICD-10-CM | POA: Diagnosis not present

## 2023-02-07 DIAGNOSIS — M6281 Muscle weakness (generalized): Secondary | ICD-10-CM | POA: Diagnosis not present

## 2023-02-09 DIAGNOSIS — M6281 Muscle weakness (generalized): Secondary | ICD-10-CM | POA: Diagnosis not present

## 2023-02-09 DIAGNOSIS — M6250 Muscle wasting and atrophy, not elsewhere classified, unspecified site: Secondary | ICD-10-CM | POA: Diagnosis not present

## 2023-02-09 DIAGNOSIS — R2689 Other abnormalities of gait and mobility: Secondary | ICD-10-CM | POA: Diagnosis not present

## 2023-02-10 DIAGNOSIS — R2689 Other abnormalities of gait and mobility: Secondary | ICD-10-CM | POA: Diagnosis not present

## 2023-02-10 DIAGNOSIS — M6250 Muscle wasting and atrophy, not elsewhere classified, unspecified site: Secondary | ICD-10-CM | POA: Diagnosis not present

## 2023-02-10 DIAGNOSIS — L97512 Non-pressure chronic ulcer of other part of right foot with fat layer exposed: Secondary | ICD-10-CM | POA: Diagnosis not present

## 2023-02-10 DIAGNOSIS — M6281 Muscle weakness (generalized): Secondary | ICD-10-CM | POA: Diagnosis not present

## 2023-02-11 DIAGNOSIS — M6250 Muscle wasting and atrophy, not elsewhere classified, unspecified site: Secondary | ICD-10-CM | POA: Diagnosis not present

## 2023-02-11 DIAGNOSIS — R2689 Other abnormalities of gait and mobility: Secondary | ICD-10-CM | POA: Diagnosis not present

## 2023-02-11 DIAGNOSIS — M6281 Muscle weakness (generalized): Secondary | ICD-10-CM | POA: Diagnosis not present

## 2023-02-13 DIAGNOSIS — R2689 Other abnormalities of gait and mobility: Secondary | ICD-10-CM | POA: Diagnosis not present

## 2023-02-13 DIAGNOSIS — M6250 Muscle wasting and atrophy, not elsewhere classified, unspecified site: Secondary | ICD-10-CM | POA: Diagnosis not present

## 2023-02-13 DIAGNOSIS — M6281 Muscle weakness (generalized): Secondary | ICD-10-CM | POA: Diagnosis not present

## 2023-02-14 DIAGNOSIS — M6281 Muscle weakness (generalized): Secondary | ICD-10-CM | POA: Diagnosis not present

## 2023-02-14 DIAGNOSIS — M6250 Muscle wasting and atrophy, not elsewhere classified, unspecified site: Secondary | ICD-10-CM | POA: Diagnosis not present

## 2023-02-14 DIAGNOSIS — R2689 Other abnormalities of gait and mobility: Secondary | ICD-10-CM | POA: Diagnosis not present

## 2023-02-15 DIAGNOSIS — R2689 Other abnormalities of gait and mobility: Secondary | ICD-10-CM | POA: Diagnosis not present

## 2023-02-15 DIAGNOSIS — M6281 Muscle weakness (generalized): Secondary | ICD-10-CM | POA: Diagnosis not present

## 2023-02-15 DIAGNOSIS — M6250 Muscle wasting and atrophy, not elsewhere classified, unspecified site: Secondary | ICD-10-CM | POA: Diagnosis not present

## 2023-02-17 DIAGNOSIS — L97512 Non-pressure chronic ulcer of other part of right foot with fat layer exposed: Secondary | ICD-10-CM | POA: Diagnosis not present

## 2023-02-18 DIAGNOSIS — R2689 Other abnormalities of gait and mobility: Secondary | ICD-10-CM | POA: Diagnosis not present

## 2023-02-18 DIAGNOSIS — M6281 Muscle weakness (generalized): Secondary | ICD-10-CM | POA: Diagnosis not present

## 2023-02-18 DIAGNOSIS — L97519 Non-pressure chronic ulcer of other part of right foot with unspecified severity: Secondary | ICD-10-CM | POA: Diagnosis not present

## 2023-02-18 DIAGNOSIS — M6250 Muscle wasting and atrophy, not elsewhere classified, unspecified site: Secondary | ICD-10-CM | POA: Diagnosis not present

## 2023-02-19 DIAGNOSIS — M6250 Muscle wasting and atrophy, not elsewhere classified, unspecified site: Secondary | ICD-10-CM | POA: Diagnosis not present

## 2023-02-19 DIAGNOSIS — R2689 Other abnormalities of gait and mobility: Secondary | ICD-10-CM | POA: Diagnosis not present

## 2023-02-19 DIAGNOSIS — M6281 Muscle weakness (generalized): Secondary | ICD-10-CM | POA: Diagnosis not present

## 2023-02-20 DIAGNOSIS — M6250 Muscle wasting and atrophy, not elsewhere classified, unspecified site: Secondary | ICD-10-CM | POA: Diagnosis not present

## 2023-02-20 DIAGNOSIS — R2689 Other abnormalities of gait and mobility: Secondary | ICD-10-CM | POA: Diagnosis not present

## 2023-02-20 DIAGNOSIS — M6281 Muscle weakness (generalized): Secondary | ICD-10-CM | POA: Diagnosis not present

## 2023-02-21 DIAGNOSIS — M6250 Muscle wasting and atrophy, not elsewhere classified, unspecified site: Secondary | ICD-10-CM | POA: Diagnosis not present

## 2023-02-21 DIAGNOSIS — M6281 Muscle weakness (generalized): Secondary | ICD-10-CM | POA: Diagnosis not present

## 2023-02-21 DIAGNOSIS — R2689 Other abnormalities of gait and mobility: Secondary | ICD-10-CM | POA: Diagnosis not present

## 2023-02-22 DIAGNOSIS — R2689 Other abnormalities of gait and mobility: Secondary | ICD-10-CM | POA: Diagnosis not present

## 2023-02-22 DIAGNOSIS — M6250 Muscle wasting and atrophy, not elsewhere classified, unspecified site: Secondary | ICD-10-CM | POA: Diagnosis not present

## 2023-02-22 DIAGNOSIS — M6281 Muscle weakness (generalized): Secondary | ICD-10-CM | POA: Diagnosis not present

## 2023-02-23 DIAGNOSIS — M6281 Muscle weakness (generalized): Secondary | ICD-10-CM | POA: Diagnosis not present

## 2023-02-23 DIAGNOSIS — M6250 Muscle wasting and atrophy, not elsewhere classified, unspecified site: Secondary | ICD-10-CM | POA: Diagnosis not present

## 2023-02-23 DIAGNOSIS — R2689 Other abnormalities of gait and mobility: Secondary | ICD-10-CM | POA: Diagnosis not present

## 2023-02-24 DIAGNOSIS — L97512 Non-pressure chronic ulcer of other part of right foot with fat layer exposed: Secondary | ICD-10-CM | POA: Diagnosis not present

## 2023-02-24 DIAGNOSIS — M6250 Muscle wasting and atrophy, not elsewhere classified, unspecified site: Secondary | ICD-10-CM | POA: Diagnosis not present

## 2023-02-24 DIAGNOSIS — M6281 Muscle weakness (generalized): Secondary | ICD-10-CM | POA: Diagnosis not present

## 2023-02-24 DIAGNOSIS — R2689 Other abnormalities of gait and mobility: Secondary | ICD-10-CM | POA: Diagnosis not present

## 2023-02-25 DIAGNOSIS — R2689 Other abnormalities of gait and mobility: Secondary | ICD-10-CM | POA: Diagnosis not present

## 2023-02-25 DIAGNOSIS — M6281 Muscle weakness (generalized): Secondary | ICD-10-CM | POA: Diagnosis not present

## 2023-02-25 DIAGNOSIS — M6250 Muscle wasting and atrophy, not elsewhere classified, unspecified site: Secondary | ICD-10-CM | POA: Diagnosis not present

## 2023-02-26 DIAGNOSIS — M6281 Muscle weakness (generalized): Secondary | ICD-10-CM | POA: Diagnosis not present

## 2023-02-26 DIAGNOSIS — R2689 Other abnormalities of gait and mobility: Secondary | ICD-10-CM | POA: Diagnosis not present

## 2023-02-26 DIAGNOSIS — M6250 Muscle wasting and atrophy, not elsewhere classified, unspecified site: Secondary | ICD-10-CM | POA: Diagnosis not present

## 2023-02-27 DIAGNOSIS — R2689 Other abnormalities of gait and mobility: Secondary | ICD-10-CM | POA: Diagnosis not present

## 2023-02-27 DIAGNOSIS — M6250 Muscle wasting and atrophy, not elsewhere classified, unspecified site: Secondary | ICD-10-CM | POA: Diagnosis not present

## 2023-02-27 DIAGNOSIS — M6281 Muscle weakness (generalized): Secondary | ICD-10-CM | POA: Diagnosis not present

## 2023-02-28 DIAGNOSIS — I10 Essential (primary) hypertension: Secondary | ICD-10-CM | POA: Diagnosis not present

## 2023-03-02 DIAGNOSIS — M6281 Muscle weakness (generalized): Secondary | ICD-10-CM | POA: Diagnosis not present

## 2023-03-02 DIAGNOSIS — M6250 Muscle wasting and atrophy, not elsewhere classified, unspecified site: Secondary | ICD-10-CM | POA: Diagnosis not present

## 2023-03-02 DIAGNOSIS — R2689 Other abnormalities of gait and mobility: Secondary | ICD-10-CM | POA: Diagnosis not present

## 2023-03-03 DIAGNOSIS — L97512 Non-pressure chronic ulcer of other part of right foot with fat layer exposed: Secondary | ICD-10-CM | POA: Diagnosis not present

## 2023-03-03 DIAGNOSIS — M25569 Pain in unspecified knee: Secondary | ICD-10-CM | POA: Diagnosis not present

## 2023-03-03 DIAGNOSIS — I482 Chronic atrial fibrillation, unspecified: Secondary | ICD-10-CM | POA: Diagnosis not present

## 2023-03-03 DIAGNOSIS — E11621 Type 2 diabetes mellitus with foot ulcer: Secondary | ICD-10-CM | POA: Diagnosis not present

## 2023-03-03 DIAGNOSIS — G8929 Other chronic pain: Secondary | ICD-10-CM | POA: Diagnosis not present

## 2023-03-03 DIAGNOSIS — R251 Tremor, unspecified: Secondary | ICD-10-CM | POA: Diagnosis not present

## 2023-03-03 DIAGNOSIS — M6281 Muscle weakness (generalized): Secondary | ICD-10-CM | POA: Diagnosis not present

## 2023-03-03 DIAGNOSIS — R2689 Other abnormalities of gait and mobility: Secondary | ICD-10-CM | POA: Diagnosis not present

## 2023-03-03 DIAGNOSIS — M545 Low back pain, unspecified: Secondary | ICD-10-CM | POA: Diagnosis not present

## 2023-03-03 DIAGNOSIS — L97509 Non-pressure chronic ulcer of other part of unspecified foot with unspecified severity: Secondary | ICD-10-CM | POA: Diagnosis not present

## 2023-03-03 DIAGNOSIS — M6250 Muscle wasting and atrophy, not elsewhere classified, unspecified site: Secondary | ICD-10-CM | POA: Diagnosis not present

## 2023-03-04 DIAGNOSIS — H2513 Age-related nuclear cataract, bilateral: Secondary | ICD-10-CM | POA: Diagnosis not present

## 2023-03-04 DIAGNOSIS — M6281 Muscle weakness (generalized): Secondary | ICD-10-CM | POA: Diagnosis not present

## 2023-03-04 DIAGNOSIS — R2689 Other abnormalities of gait and mobility: Secondary | ICD-10-CM | POA: Diagnosis not present

## 2023-03-04 DIAGNOSIS — M6250 Muscle wasting and atrophy, not elsewhere classified, unspecified site: Secondary | ICD-10-CM | POA: Diagnosis not present

## 2023-03-04 DIAGNOSIS — H401131 Primary open-angle glaucoma, bilateral, mild stage: Secondary | ICD-10-CM | POA: Diagnosis not present

## 2023-03-04 DIAGNOSIS — H53461 Homonymous bilateral field defects, right side: Secondary | ICD-10-CM | POA: Diagnosis not present

## 2023-03-05 DIAGNOSIS — M6250 Muscle wasting and atrophy, not elsewhere classified, unspecified site: Secondary | ICD-10-CM | POA: Diagnosis not present

## 2023-03-05 DIAGNOSIS — M6281 Muscle weakness (generalized): Secondary | ICD-10-CM | POA: Diagnosis not present

## 2023-03-05 DIAGNOSIS — R2689 Other abnormalities of gait and mobility: Secondary | ICD-10-CM | POA: Diagnosis not present

## 2023-03-06 DIAGNOSIS — R2689 Other abnormalities of gait and mobility: Secondary | ICD-10-CM | POA: Diagnosis not present

## 2023-03-06 DIAGNOSIS — M6250 Muscle wasting and atrophy, not elsewhere classified, unspecified site: Secondary | ICD-10-CM | POA: Diagnosis not present

## 2023-03-06 DIAGNOSIS — M6281 Muscle weakness (generalized): Secondary | ICD-10-CM | POA: Diagnosis not present

## 2023-03-09 DIAGNOSIS — M6281 Muscle weakness (generalized): Secondary | ICD-10-CM | POA: Diagnosis not present

## 2023-03-09 DIAGNOSIS — R2689 Other abnormalities of gait and mobility: Secondary | ICD-10-CM | POA: Diagnosis not present

## 2023-03-09 DIAGNOSIS — M6250 Muscle wasting and atrophy, not elsewhere classified, unspecified site: Secondary | ICD-10-CM | POA: Diagnosis not present

## 2023-03-09 DIAGNOSIS — I2699 Other pulmonary embolism without acute cor pulmonale: Secondary | ICD-10-CM | POA: Diagnosis not present

## 2023-03-09 DIAGNOSIS — N390 Urinary tract infection, site not specified: Secondary | ICD-10-CM | POA: Diagnosis not present

## 2023-03-09 DIAGNOSIS — R410 Disorientation, unspecified: Secondary | ICD-10-CM | POA: Diagnosis not present

## 2023-03-10 ENCOUNTER — Ambulatory Visit: Payer: Medicare Other | Admitting: Cardiology

## 2023-03-10 DIAGNOSIS — M6250 Muscle wasting and atrophy, not elsewhere classified, unspecified site: Secondary | ICD-10-CM | POA: Diagnosis not present

## 2023-03-10 DIAGNOSIS — M6281 Muscle weakness (generalized): Secondary | ICD-10-CM | POA: Diagnosis not present

## 2023-03-10 DIAGNOSIS — L97512 Non-pressure chronic ulcer of other part of right foot with fat layer exposed: Secondary | ICD-10-CM | POA: Diagnosis not present

## 2023-03-10 DIAGNOSIS — R2689 Other abnormalities of gait and mobility: Secondary | ICD-10-CM | POA: Diagnosis not present

## 2023-03-10 DIAGNOSIS — N3020 Other chronic cystitis without hematuria: Secondary | ICD-10-CM | POA: Diagnosis not present

## 2023-03-11 DIAGNOSIS — R2689 Other abnormalities of gait and mobility: Secondary | ICD-10-CM | POA: Diagnosis not present

## 2023-03-11 DIAGNOSIS — M6281 Muscle weakness (generalized): Secondary | ICD-10-CM | POA: Diagnosis not present

## 2023-03-11 DIAGNOSIS — M6250 Muscle wasting and atrophy, not elsewhere classified, unspecified site: Secondary | ICD-10-CM | POA: Diagnosis not present

## 2023-03-12 DIAGNOSIS — N390 Urinary tract infection, site not specified: Secondary | ICD-10-CM | POA: Diagnosis not present

## 2023-03-13 DIAGNOSIS — M6281 Muscle weakness (generalized): Secondary | ICD-10-CM | POA: Diagnosis not present

## 2023-03-13 DIAGNOSIS — M6250 Muscle wasting and atrophy, not elsewhere classified, unspecified site: Secondary | ICD-10-CM | POA: Diagnosis not present

## 2023-03-13 DIAGNOSIS — R2689 Other abnormalities of gait and mobility: Secondary | ICD-10-CM | POA: Diagnosis not present

## 2023-03-16 DIAGNOSIS — M6281 Muscle weakness (generalized): Secondary | ICD-10-CM | POA: Diagnosis not present

## 2023-03-16 DIAGNOSIS — M6250 Muscle wasting and atrophy, not elsewhere classified, unspecified site: Secondary | ICD-10-CM | POA: Diagnosis not present

## 2023-03-16 DIAGNOSIS — I10 Essential (primary) hypertension: Secondary | ICD-10-CM | POA: Diagnosis not present

## 2023-03-16 DIAGNOSIS — R2689 Other abnormalities of gait and mobility: Secondary | ICD-10-CM | POA: Diagnosis not present

## 2023-03-17 DIAGNOSIS — I10 Essential (primary) hypertension: Secondary | ICD-10-CM | POA: Diagnosis not present

## 2023-03-17 DIAGNOSIS — M6250 Muscle wasting and atrophy, not elsewhere classified, unspecified site: Secondary | ICD-10-CM | POA: Diagnosis not present

## 2023-03-17 DIAGNOSIS — R2689 Other abnormalities of gait and mobility: Secondary | ICD-10-CM | POA: Diagnosis not present

## 2023-03-17 DIAGNOSIS — M6281 Muscle weakness (generalized): Secondary | ICD-10-CM | POA: Diagnosis not present

## 2023-03-18 ENCOUNTER — Inpatient Hospital Stay (HOSPITAL_COMMUNITY)
Admission: EM | Admit: 2023-03-18 | Discharge: 2023-03-20 | DRG: 641 | Disposition: A | Discharge status: Skilled Nursing Facility | Payer: Medicare Other | Source: Skilled Nursing Facility | Attending: Internal Medicine | Admitting: Internal Medicine

## 2023-03-18 ENCOUNTER — Observation Stay (HOSPITAL_COMMUNITY): Payer: Medicare Other

## 2023-03-18 ENCOUNTER — Other Ambulatory Visit: Payer: Self-pay

## 2023-03-18 DIAGNOSIS — I4819 Other persistent atrial fibrillation: Secondary | ICD-10-CM | POA: Diagnosis present

## 2023-03-18 DIAGNOSIS — Z743 Need for continuous supervision: Secondary | ICD-10-CM | POA: Diagnosis not present

## 2023-03-18 DIAGNOSIS — I10 Essential (primary) hypertension: Secondary | ICD-10-CM | POA: Diagnosis not present

## 2023-03-18 DIAGNOSIS — N400 Enlarged prostate without lower urinary tract symptoms: Secondary | ICD-10-CM | POA: Diagnosis present

## 2023-03-18 DIAGNOSIS — E11622 Type 2 diabetes mellitus with other skin ulcer: Secondary | ICD-10-CM

## 2023-03-18 DIAGNOSIS — Z833 Family history of diabetes mellitus: Secondary | ICD-10-CM | POA: Diagnosis not present

## 2023-03-18 DIAGNOSIS — E785 Hyperlipidemia, unspecified: Secondary | ICD-10-CM | POA: Diagnosis not present

## 2023-03-18 DIAGNOSIS — J449 Chronic obstructive pulmonary disease, unspecified: Secondary | ICD-10-CM | POA: Diagnosis present

## 2023-03-18 DIAGNOSIS — L97919 Non-pressure chronic ulcer of unspecified part of right lower leg with unspecified severity: Secondary | ICD-10-CM | POA: Diagnosis not present

## 2023-03-18 DIAGNOSIS — E871 Hypo-osmolality and hyponatremia: Secondary | ICD-10-CM | POA: Diagnosis not present

## 2023-03-18 DIAGNOSIS — I110 Hypertensive heart disease with heart failure: Secondary | ICD-10-CM | POA: Diagnosis not present

## 2023-03-18 DIAGNOSIS — N179 Acute kidney failure, unspecified: Secondary | ICD-10-CM | POA: Diagnosis not present

## 2023-03-18 DIAGNOSIS — N529 Male erectile dysfunction, unspecified: Secondary | ICD-10-CM | POA: Diagnosis present

## 2023-03-18 DIAGNOSIS — Z8249 Family history of ischemic heart disease and other diseases of the circulatory system: Secondary | ICD-10-CM

## 2023-03-18 DIAGNOSIS — M160 Bilateral primary osteoarthritis of hip: Secondary | ICD-10-CM | POA: Diagnosis present

## 2023-03-18 DIAGNOSIS — I890 Lymphedema, not elsewhere classified: Secondary | ICD-10-CM | POA: Diagnosis not present

## 2023-03-18 DIAGNOSIS — Z8042 Family history of malignant neoplasm of prostate: Secondary | ICD-10-CM

## 2023-03-18 DIAGNOSIS — E1140 Type 2 diabetes mellitus with diabetic neuropathy, unspecified: Secondary | ICD-10-CM | POA: Diagnosis present

## 2023-03-18 DIAGNOSIS — Z87891 Personal history of nicotine dependence: Secondary | ICD-10-CM

## 2023-03-18 DIAGNOSIS — R5381 Other malaise: Secondary | ICD-10-CM | POA: Diagnosis not present

## 2023-03-18 DIAGNOSIS — E875 Hyperkalemia: Principal | ICD-10-CM | POA: Diagnosis present

## 2023-03-18 DIAGNOSIS — I5032 Chronic diastolic (congestive) heart failure: Secondary | ICD-10-CM | POA: Diagnosis present

## 2023-03-18 DIAGNOSIS — E861 Hypovolemia: Secondary | ICD-10-CM | POA: Diagnosis present

## 2023-03-18 DIAGNOSIS — Z8546 Personal history of malignant neoplasm of prostate: Secondary | ICD-10-CM

## 2023-03-18 DIAGNOSIS — Z6830 Body mass index (BMI) 30.0-30.9, adult: Secondary | ICD-10-CM

## 2023-03-18 DIAGNOSIS — Z86711 Personal history of pulmonary embolism: Secondary | ICD-10-CM

## 2023-03-18 DIAGNOSIS — Z8673 Personal history of transient ischemic attack (TIA), and cerebral infarction without residual deficits: Secondary | ICD-10-CM

## 2023-03-18 DIAGNOSIS — I517 Cardiomegaly: Secondary | ICD-10-CM | POA: Diagnosis not present

## 2023-03-18 DIAGNOSIS — D649 Anemia, unspecified: Secondary | ICD-10-CM | POA: Diagnosis not present

## 2023-03-18 DIAGNOSIS — R0989 Other specified symptoms and signs involving the circulatory and respiratory systems: Secondary | ICD-10-CM | POA: Diagnosis not present

## 2023-03-18 DIAGNOSIS — Z7901 Long term (current) use of anticoagulants: Secondary | ICD-10-CM | POA: Diagnosis not present

## 2023-03-18 DIAGNOSIS — Z79899 Other long term (current) drug therapy: Secondary | ICD-10-CM

## 2023-03-18 DIAGNOSIS — T502X5A Adverse effect of carbonic-anhydrase inhibitors, benzothiadiazides and other diuretics, initial encounter: Secondary | ICD-10-CM | POA: Diagnosis present

## 2023-03-18 DIAGNOSIS — Z888 Allergy status to other drugs, medicaments and biological substances status: Secondary | ICD-10-CM | POA: Diagnosis not present

## 2023-03-18 DIAGNOSIS — G8929 Other chronic pain: Secondary | ICD-10-CM | POA: Diagnosis present

## 2023-03-18 DIAGNOSIS — E669 Obesity, unspecified: Secondary | ICD-10-CM | POA: Diagnosis present

## 2023-03-18 LAB — CBC WITH DIFFERENTIAL/PLATELET
Abs Immature Granulocytes: 0.04 10*3/uL (ref 0.00–0.07)
Basophils Absolute: 0 10*3/uL (ref 0.0–0.1)
Basophils Relative: 0 %
Eosinophils Absolute: 0.1 10*3/uL (ref 0.0–0.5)
Eosinophils Relative: 1 %
HCT: 45.3 % (ref 39.0–52.0)
Hemoglobin: 13.7 g/dL (ref 13.0–17.0)
Immature Granulocytes: 0 %
Lymphocytes Relative: 16 %
Lymphs Abs: 1.7 10*3/uL (ref 0.7–4.0)
MCH: 26.6 pg (ref 26.0–34.0)
MCHC: 30.2 g/dL (ref 30.0–36.0)
MCV: 88 fL (ref 80.0–100.0)
Monocytes Absolute: 1.4 10*3/uL — ABNORMAL HIGH (ref 0.1–1.0)
Monocytes Relative: 13 %
Neutro Abs: 7.7 10*3/uL (ref 1.7–7.7)
Neutrophils Relative %: 70 %
Platelets: 232 10*3/uL (ref 150–400)
RBC: 5.15 MIL/uL (ref 4.22–5.81)
RDW: 17.4 % — ABNORMAL HIGH (ref 11.5–15.5)
WBC: 11 10*3/uL — ABNORMAL HIGH (ref 4.0–10.5)
nRBC: 0 % (ref 0.0–0.2)

## 2023-03-18 LAB — MAGNESIUM: Magnesium: 2.1 mg/dL (ref 1.7–2.4)

## 2023-03-18 LAB — BRAIN NATRIURETIC PEPTIDE: B Natriuretic Peptide: 311.1 pg/mL — ABNORMAL HIGH (ref 0.0–100.0)

## 2023-03-18 LAB — COMPREHENSIVE METABOLIC PANEL
ALT: 13 U/L (ref 0–44)
AST: 23 U/L (ref 15–41)
Albumin: 2.3 g/dL — ABNORMAL LOW (ref 3.5–5.0)
Alkaline Phosphatase: 137 U/L — ABNORMAL HIGH (ref 38–126)
Anion gap: 11 (ref 5–15)
BUN: 27 mg/dL — ABNORMAL HIGH (ref 8–23)
CO2: 16 mmol/L — ABNORMAL LOW (ref 22–32)
Calcium: 9.1 mg/dL (ref 8.9–10.3)
Chloride: 104 mmol/L (ref 98–111)
Creatinine, Ser: 1.66 mg/dL — ABNORMAL HIGH (ref 0.61–1.24)
GFR, Estimated: 42 mL/min — ABNORMAL LOW (ref >60)
Glucose, Bld: 98 mg/dL (ref 70–99)
Potassium: 6.2 mmol/L — ABNORMAL HIGH (ref 3.5–5.1)
Sodium: 131 mmol/L — ABNORMAL LOW (ref 135–145)
Total Bilirubin: 0.4 mg/dL (ref 0.3–1.2)
Total Protein: 8.9 g/dL — ABNORMAL HIGH (ref 6.5–8.1)

## 2023-03-18 LAB — GLUCOSE, CAPILLARY: Glucose-Capillary: 97 mg/dL (ref 70–99)

## 2023-03-18 LAB — POTASSIUM: Potassium: 6.7 mmol/L (ref 3.5–5.1)

## 2023-03-18 MED ORDER — CARVEDILOL 12.5 MG PO TABS
12.5000 mg | ORAL_TABLET | Freq: Two times a day (BID) | ORAL | Status: DC
  Administered 2023-03-18 – 2023-03-20 (×3): 12.5 mg via ORAL
  Filled 2023-03-18 (×4): qty 1

## 2023-03-18 MED ORDER — ROSUVASTATIN CALCIUM 20 MG PO TABS
20.0000 mg | ORAL_TABLET | Freq: Every evening | ORAL | Status: DC
  Administered 2023-03-18 – 2023-03-20 (×3): 20 mg via ORAL
  Filled 2023-03-18 (×4): qty 1

## 2023-03-18 MED ORDER — ONDANSETRON HCL 4 MG/2ML IJ SOLN: 4.0000 mg | Freq: Four times a day (QID) | INTRAMUSCULAR | Status: DC | PRN

## 2023-03-18 MED ORDER — DEXTROSE 50 % IV SOLN
1.0000 | Freq: Once | INTRAVENOUS | Status: AC
  Administered 2023-03-18: 50 mL via INTRAVENOUS
  Filled 2023-03-18: qty 50

## 2023-03-18 MED ORDER — INSULIN ASPART 100 UNIT/ML IJ SOLN: 0.0000 [IU] | Freq: Every day | INTRAMUSCULAR | Status: DC

## 2023-03-18 MED ORDER — SODIUM ZIRCONIUM CYCLOSILICATE 10 G PO PACK
10.0000 g | PACK | Freq: Once | ORAL | Status: AC
  Administered 2023-03-18: 10 g via ORAL
  Filled 2023-03-18: qty 1

## 2023-03-18 MED ORDER — SODIUM CHLORIDE 0.9 % IV SOLN: INTRAVENOUS | Status: DC

## 2023-03-18 MED ORDER — SODIUM ZIRCONIUM CYCLOSILICATE 10 G PO PACK
10.0000 g | PACK | Freq: Two times a day (BID) | ORAL | Status: DC
  Filled 2023-03-18: qty 1

## 2023-03-18 MED ORDER — INSULIN ASPART 100 UNIT/ML IV SOLN
5.0000 [IU] | Freq: Once | INTRAVENOUS | Status: AC
  Administered 2023-03-18: 5 [IU] via INTRAVENOUS

## 2023-03-18 MED ORDER — APIXABAN 5 MG PO TABS
5.0000 mg | ORAL_TABLET | Freq: Two times a day (BID) | ORAL | Status: DC
  Administered 2023-03-18 – 2023-03-20 (×4): 5 mg via ORAL
  Filled 2023-03-18 (×4): qty 1

## 2023-03-18 MED ORDER — ALBUTEROL SULFATE (2.5 MG/3ML) 0.083% IN NEBU
10.0000 mg | INHALATION_SOLUTION | Freq: Once | RESPIRATORY_TRACT | Status: AC
  Administered 2023-03-18: 10 mg via RESPIRATORY_TRACT
  Filled 2023-03-18: qty 12

## 2023-03-18 MED ORDER — INSULIN ASPART 100 UNIT/ML IJ SOLN: 0.0000 [IU] | Freq: Three times a day (TID) | INTRAMUSCULAR | Status: DC

## 2023-03-18 MED ORDER — IPRATROPIUM-ALBUTEROL 0.5-2.5 (3) MG/3ML IN SOLN: 3.0000 mL | RESPIRATORY_TRACT | Status: DC | PRN

## 2023-03-18 MED ORDER — SODIUM CHLORIDE 0.9 % IV BOLUS
500.0000 mL | Freq: Once | INTRAVENOUS | Status: AC
  Administered 2023-03-18: 500 mL via INTRAVENOUS

## 2023-03-18 MED ORDER — ENSURE ENLIVE PO LIQD
237.0000 mL | Freq: Three times a day (TID) | ORAL | Status: DC
  Administered 2023-03-20 (×2): 237 mL via ORAL

## 2023-03-18 MED ORDER — TAMSULOSIN HCL 0.4 MG PO CAPS
0.4000 mg | ORAL_CAPSULE | Freq: Every day | ORAL | Status: DC
  Administered 2023-03-18 – 2023-03-20 (×3): 0.4 mg via ORAL
  Filled 2023-03-18 (×3): qty 1

## 2023-03-18 MED ORDER — ACETAMINOPHEN 325 MG PO TABS
650.0000 mg | ORAL_TABLET | Freq: Four times a day (QID) | ORAL | Status: DC | PRN
  Administered 2023-03-18: 650 mg via ORAL
  Filled 2023-03-18: qty 2

## 2023-03-18 MED ORDER — POLYETHYLENE GLYCOL 3350 17 G PO PACK: 17.0000 g | PACK | Freq: Every day | ORAL | Status: DC | PRN

## 2023-03-18 MED ORDER — ADULT MULTIVITAMIN W/MINERALS CH
1.0000 | ORAL_TABLET | Freq: Every day | ORAL | Status: DC
  Administered 2023-03-19 – 2023-03-20 (×2): 1 via ORAL
  Filled 2023-03-18 (×2): qty 1

## 2023-03-18 NOTE — Significant Event (Signed)
K of 6.7 but noted: "HEMOLYSIS AT THIS LEVEL MAY AFFECT RESULT" on lab results.  Sounds like sample hemolyzed.  Will order stat repeat K.  Update: Actual repeat K was only 5.8.  leaving on NS for the moment.  Ordering repeat BMP this AM.

## 2023-03-18 NOTE — H&P (Signed)
4  History and Physical    Patient: [name] [id number] DOB: September 13, 1945 DOA: 03/18/2023 DOS: the patient was seen and examined on 03/18/2023 PCP: Corwin Levins, MD  Patient coming from: SNF  Chief Complaint: Abnormal labs  HPI: [name] is a 77 y.o. male with medical history significant of type 2 diabetes with neuropathy, hyperlipidemia, A-fib on Eliquis, hypertension, osteoarthritis of both hips, COPD, erectile dysfunction, history of prostate cancer, previous stroke with no residual weakness, peripheral neuropathy, lymphedema, who was brought in from skilled nursing facility after patient was found to have elevated potassium 6.2 on routine blood work.  Patient denied nausea vomiting chest pain cough shortness of breath palpitation abdominal pain.  ED course: Vitals: Temperature 98.7 respiratory rate 20 pulse 59 BP 08/21/1979  in the emergency room patient was found to have A-fib rate controlled no ST segment changes Labs were significant for potassium 6.2 sodium 131 creatinine 1.66 bicarb of 16 albumin 2.3 BNP 311 WBC 11.0  Review of Systems: As mentioned in the history of present illness. All other systems reviewed and are negative. Past Medical History:  Diagnosis Date   Adenoma 05/2008   Alcoholism in recovery [facility])    BPH (benign prostatic hyperplasia)    Chronic LBP    Hip & Back -- [facility]   CKD (chronic kidney disease)    with AKI requiring brief CRRT 02/2020   Colon polyps    Controlled type 2 diabetes mellitus with neuropathy (HCC) 01/2007   COPD (chronic obstructive pulmonary disease) (HCC)    Diverticulitis of colon    ED (erectile dysfunction)    s/p Penile prosthesis (09/2010)   History of prostate cancer    Dr. Vernie Ammons   History of sick sinus syndrome    reduced BB dose for Bradycardia   HLD (hyperlipidemia)    Hypertension    Impaired glucose tolerance 12/21/2013   Nephrolithiasis    Osteoarthritis of  both hips    PAF (paroxysmal atrial fibrillation) (HCC)    No longer on Amiodarone.  Not on Anticoaguation b/c no recurrence.   Peripheral neuropathy 12/21/2013   Stroke (HCC)    left occipital CVA ~ 2018; possible TIA 01/24/22   Subdural hematoma (HCC) 04/04/2022   Anticoagulation stopped   Venous insufficiency    Past Surgical History:  Procedure Laterality Date   IR ANGIO INTRA EXTRACRAN SEL COM CAROTID INNOMINATE UNI R MOD SED  04/17/2022   LEFT HEART CATH AND CORONARY ANGIOGRAPHY  2003   Normal Coronary Arteries.   NM MYOVIEW LTD  02/21/2020   EF 60%.  Medium sized mild severity defect in the basal inferior, mid inferior and apical inferior location-suggesting of ischemia.  Read as low-intermediate risk.  (On cardiology review this is felt to be a fixed defect either related to prior infarct versus diaphragmatic attenuation.  Felt to be low risk)   PENILE PROSTHESIS IMPLANT  06/21/2012   Procedure: PENILE PROTHESIS INFLATABLE;  Surgeon: Garnett Farm, MD;  Location: [facility];  Service: Urology;  Laterality: N/A;  REMOVAL AND REPLACEMENT OF SOME  OF PROSTHESIS (AMS)    PENILE PROSTHESIS PLACEMENT  09/2010   PROSTATECTOMY     RADIOLOGY WITH ANESTHESIA N/A 04/17/2022   Procedure: Right middle meningeal artery embolization;  Surgeon: Lisbeth Renshaw, MD;  Location: [facility] OR;  Service: Radiology;  Laterality: N/A;   REMOVAL OF PENILE PROSTHESIS N/A 02/15/2018   Procedure: REMOVAL OF PENILE PROSTHESIS;  Surgeon: Ihor Gully,  MD;  Location: [facility];  Service: Urology;  Laterality: N/A;   TRANSTHORACIC ECHOCARDIOGRAM  01/2022   EF 70-75%:Marland Kitchen  Hyperdynamic LV with no RWMA.  Mild LVH.  Severely dilated left atrium suggesting notable diastolic dysfunction, but unable to interpret.  Mildly enlarged RV with normal function.  Normal PAP despite moderate dilated RA and increased RAP/CVP.Marland Kitchen  No MS or MR.  No AAS or AI.  Aortic root measuring 42 mm with ascending aorta measuring 41 mm.    TRANSTHORACIC ECHOCARDIOGRAM  08/19/2022   a) 12/17/'23: EF 65 to 70%.  Normal Fxn.  No RWMA.  Severe asymmetrical-septal LVH.  Unable to assess diastolic parameters.  Normal RV fxn, mildly increased thickness.  Severe LA and mild RA dilation with mildly elevated RAP.  (8 mmHg).  Relatively normal valves.;; b) 1/'24: No change. EF 70-75%, Hyperdyamic. Severe LA & Mod RA dilation.  RAP 8 mmHg. Unable to assess LV CFxn   Social History:  reports that he quit smoking about 40 years ago. His smoking use included cigarettes. He has never used smokeless tobacco. He reports that he does not currently use alcohol. He reports that he does not use drugs.  Allergies  Allergen Reactions   Ciprofloxacin Other (See Comments)    All over weakness    Family History  Problem Relation Age of Onset   Coronary artery disease Mother    Heart attack Mother    Coronary artery disease Father    Prostate cancer Father    Diabetes Father     Prior to Admission medications   Medication Sig Start Date End Date Taking? Authorizing Provider  acetaminophen (TYLENOL) 500 MG tablet Take 500 mg by mouth in the morning and at bedtime.    [provider]  apixaban (ELIQUIS) 5 MG TABS tablet Take 1 tablet (5 mg total) by mouth 2 (two) times daily. 07/14/22   Regalado, Belkys A, MD  Blood Glucose Monitoring Suppl (ACCU-CHEK GUIDE ME) w/Device KIT Use as directed twice per day E11.9 04/02/22   Corwin Levins, MD  carvedilol (COREG) 12.5 MG tablet Take 1 tablet (12.5 mg total) by mouth 2 (two) times daily with a meal. 08/23/22   Osvaldo Shipper, MD  feeding supplement (ENSURE ENLIVE / ENSURE PLUS) LIQD Take 237 mLs by mouth 3 (three) times daily between meals. 08/23/22   Osvaldo Shipper, MD  furosemide (LASIX) 40 MG tablet TAKE 1 TABLET BY MOUTH DAILY .  MAY TAKE AN ADDITIONAL 1 TABLET  BY MOUTH IF NEEDED FOR SWELLING Patient taking differently: Take 40 mg by mouth daily. TAKE 1 TABLET BY MOUTH DAILY .  MAY TAKE AN  ADDITIONAL 1 TABLET  BY MOUTH IF NEEDED FOR SWELLING 03/10/22   Corwin Levins, MD  glucose blood (ACCU-CHEK GUIDE) test strip Use as instructed twice per day E11.9 04/02/22   Corwin Levins, MD  guaiFENesin (ROBITUSSIN) 100 MG/5ML liquid Take 5 mLs by mouth every 4 (four) hours as needed for cough or to loosen phlegm. 08/23/22   Osvaldo Shipper, MD  Lancet Devices [facility] PLUS LANCING) MISC  09/19/20   [provider]  Lancets [facility] DELICA PLUS [place]) MISC USE ONCE DAILY 10/29/20   Corwin Levins, MD  magnesium oxide (MAG-OX) 400 (240 Mg) MG tablet Take 1 tablet (400 mg total) by mouth 2 (two) times daily. Patient taking differently: Take 400 mg by mouth daily. 08/23/22   Osvaldo Shipper, MD  Multiple Vitamin (MULTIVITAMIN WITH MINERALS) TABS tablet Take 1 tablet by  mouth daily. 08/23/22   Osvaldo Shipper, MD  polyethylene glycol (MIRALAX / GLYCOLAX) 17 g packet Take 17 g by mouth daily as needed for mild constipation. 08/23/22   Osvaldo Shipper, MD  potassium chloride (KLOR-CON) 10 MEQ tablet Take 2 tablets (20 mEq total) by mouth daily. 07/08/22   Regalado, Belkys A, MD  PROAIR HFA 108 (90 Base) MCG/ACT inhaler INHALE 2 PUFFS INTO THE  LUNGS EVERY 6 HOURS AS  NEEDED Patient taking differently: Inhale 2 puffs into the lungs every 6 (six) hours as needed for shortness of breath or wheezing. 06/01/20   Corwin Levins, MD  rosuvastatin (CRESTOR) 20 MG tablet Take 20 mg by mouth every evening.    [provider]  tamsulosin (FLOMAX) 0.4 MG CAPS capsule Take 1 capsule (0.4 mg total) by mouth daily after supper. 07/08/22   Regalado, Prentiss Bells, MD    Physical Exam: General:Elderly male in no acute distress answers questions appropriately Cardiovascular: S1-S2 and A-fib Respiratory: Lungs clear to auscultation bilaterally Abdomen: Obese nondistended nontender Musculoskeletal: Bilateral lower extremity edema that is chronic Skin: Dressing noted to bilateral lower  extremities   Vitals:   03/18/23 1505  BP: 131/81  Pulse: (!) 59  Resp: 20  Temp: 98.7 F (37.1 C)  TempSrc: Oral  SpO2: 98%     Data Reviewed:  I have reviewed patient's previous documentation including previous echo showing EF 70 to 75%, I have reviewed ED physician documentation, reviewed patient's CBC as well as CMP, reviewed patient's labs as well as EKG  Assessment and Plan:  Severe hyperkalemia in the setting of acute kidney injury Patient received hyperkalemia cocktail in the emergency room including Albuterol nebulization, insulin, D50, as well as Lokelma Patient also take potassium tablets, needs to be discontinued at discharge I have placed an order to repeat patient's potassium I will keep on Lokelma maintenance dose for now Plan of care discussed with ED physician  Acute kidney injury likely secondary to diuretic use as well as possible dehydration Patient presented with creatinine of 1.66 however creatinine 6 months ago was 1.0 We will keep on maintenance IV fluid cautiously Monitor renal function closely Avoid nephrotoxic medications Renally dose all drugs  Hypovolemic hyponatremia Patient with sodium 131 Continue current maintenance fluid  Type 2 diabetes with neuropathy Placed on carb controlled diet Placed on insulin regimen Monitor glucose closely  Hyperlipidemia, Continue statin therapy   Chronic persistent A-fib on Eliquis, Continue Eliquis as well as metoprolol  Hypertension-continue metoprolol Continue BP monitoring  Osteoarthritis of both hips-placed on as needed Tylenol  COPD-not in acute exacerbation Continue as needed nebulization  BPH-continue tamsulosin  History of prostate cancer-no acute intervention  Previous stroke with no residual weakness-continue statin therapy  DVT prophylaxis: Patient is on Eliquis  Elevated BNP with no history of heart failure Patient's echocardiogram obtained January 2024 reviewed showing EF  70 to 75% Since patient is not having any symptoms we will not repeat echo We will continue to monitor closely Chest x-ray requested by me and pending   Advance Care Planning:   Code Status: Prior full code  Consults: None  Family Communication: Discussed with patient's daughter who is an Charity fundraiser present at bedside  Severity of Illness: The appropriate patient status for this patient is OBSERVATION. Observation status is judged to be reasonable and necessary in order to provide the required intensity of service to ensure the patient's safety. The patient's presenting symptoms, physical exam findings, and initial radiographic and laboratory data in the  context of their medical condition is felt to place them at decreased risk for further clinical deterioration. Furthermore, it is anticipated that the patient will be medically stable for discharge from the hospital within 2 midnights of admission.   Author: [name], MD 03/18/2023 5:07 PM  For on call review [email or web address].

## 2023-03-18 NOTE — ED Notes (Signed)
ED TO INPATIENT HANDOFF REPORT  ED Nurse Name and Phone #: [phone]  S Name/Age/Gender [name] 77 y.o. male Room/Bed: H015C/H015C  Code Status   Code Status: Full Code  Home/SNF/Other Home Patient oriented to: self, place, time, and situation Is this baseline? Yes   Triage Complete: Triage complete  Chief Complaint Acute hyperkalemia [E87.5]  Triage Note Pt from [facility] health a rehab per staff pt had  k 6.3. Pt denies cp, sob, dizziness. No complaints at this time.    Allergies Allergies  Allergen Reactions   Ciprofloxacin Other (See Comments)    All over weakness    Level of Care/Admitting Diagnosis ED Disposition     ED Disposition  Admit   Condition  --   Comment  Hospital Area: [facility] [[id number]]  Level of Care: Telemetry Medical [104]  May place patient in observation at [facility] or [facility] if equivalent level of care is available:: Yes  Covid Evaluation: Asymptomatic - no recent exposure (last 10 days) testing not required  Diagnosis: Acute hyperkalemia [172200]  Admitting Physician: [name] [[id number]]  Attending Physician: [name] [[id number]]          B Medical/Surgery History Past Medical History:  Diagnosis Date   Adenoma 05/2008   Alcoholism in recovery [facility])    BPH (benign prostatic hyperplasia)    Chronic LBP    Hip & Back -- Se[facility]. [facility]   CKD (chronic kidney disease)    with AKI requiring brief CRRT 02/2020   Colon polyps    Controlled type 2 diabetes mellitus with neuropathy (HCC) 01/2007   COPD (chronic obstructive pulmonary disease) (HCC)    Diverticulitis of colon    ED (erectile dysfunction)    s/p Penile prosthesis (09/2010)   History of prostate cancer    Dr. Vernie Ammons   History of sick sinus syndrome    reduced BB dose for Bradycardia   HLD (hyperlipidemia)    Hypertension    Impaired glucose tolerance 12/21/2013   Nephrolithiasis    Osteoarthritis  of both hips    PAF (paroxysmal atrial fibrillation) (HCC)    No longer on Amiodarone.  Not on Anticoaguation b/c no recurrence.   Peripheral neuropathy 12/21/2013   Stroke (HCC)    left occipital CVA ~ 2018; possible TIA 01/24/22   Subdural hematoma (HCC) 04/04/2022   Anticoagulation stopped   Venous insufficiency    Past Surgical History:  Procedure Laterality Date   IR ANGIO INTRA EXTRACRAN SEL COM CAROTID INNOMINATE UNI R MOD SED  04/17/2022   LEFT HEART CATH AND CORONARY ANGIOGRAPHY  2003   Normal Coronary Arteries.   NM MYOVIEW LTD  02/21/2020   EF 60%.  Medium sized mild severity defect in the basal inferior, mid inferior and apical inferior location-suggesting of ischemia.  Read as low-intermediate risk.  (On cardiology review this is felt to be a fixed defect either related to prior infarct versus diaphragmatic attenuation.  Felt to be low risk)   PENILE PROSTHESIS IMPLANT  06/21/2012   Procedure: PENILE PROTHESIS INFLATABLE;  Surgeon: Garnett Farm, MD;  Location: [facility];  Service: Urology;  Laterality: N/A;  REMOVAL AND REPLACEMENT OF SOME  OF PROSTHESIS (AMS)    PENILE PROSTHESIS PLACEMENT  09/2010   PROSTATECTOMY     RADIOLOGY WITH ANESTHESIA N/A 04/17/2022   Procedure: Right middle meningeal artery embolization;  Surgeon: Lisbeth Renshaw, MD;  Location: [facility] OR;  Service:  Radiology;  Laterality: N/A;   REMOVAL OF PENILE PROSTHESIS N/A 02/15/2018   Procedure: REMOVAL OF PENILE PROSTHESIS;  Surgeon: Ihor Gully, MD;  [facility];  Service: Urology;  Laterality: N/A;   TRANSTHORACIC ECHOCARDIOGRAM  01/2022   EF 70-75%:Marland Kitchen  Hyperdynamic LV with no RWMA.  Mild LVH.  Severely dilated left atrium suggesting notable diastolic dysfunction, but unable to interpret.  Mildly enlarged RV with normal function.  Normal PAP despite moderate dilated RA and increased RAP/CVP.Marland Kitchen  No MS or MR.  No AAS or AI.  Aortic root measuring 42 mm with ascending aorta measuring 41  mm.   TRANSTHORACIC ECHOCARDIOGRAM  08/19/2022   a) 12/17/'23: EF 65 to 70%.  Normal Fxn.  No RWMA.  Severe asymmetrical-septal LVH.  Unable to assess diastolic parameters.  Normal RV fxn, mildly increased thickness.  Severe LA and mild RA dilation with mildly elevated RAP.  (8 mmHg).  Relatively normal valves.;; b) 1/'24: No change. EF 70-75%, Hyperdyamic. Severe LA & Mod RA dilation.  RAP 8 mmHg. Unable to assess LV CFxn     A IV Location/Drains/Wounds Patient Lines/Drains/Airways Status     Active Line/Drains/Airways     None            Intake/Output Last 24 hours No intake or output data in the 24 hours ending 03/18/23 1820  Labs/Imaging Results for orders placed or performed during the hospital encounter of 03/18/23 (from the past 48 hour(s))  Comprehensive metabolic panel     Status: Abnormal   Collection Time: 03/18/23  3:49 PM  Result Value Ref Range   Sodium 131 (L) 135 - 145 mmol/L   Potassium 6.2 (H) 3.5 - 5.1 mmol/L   Chloride 104 98 - 111 mmol/L   CO2 16 (L) 22 - 32 mmol/L   Glucose, Bld 98 70 - 99 mg/dL    Comment: Glucose reference range applies only to samples taken after fasting for at least 8 hours.   BUN 27 (H) 8 - 23 mg/dL   Creatinine, Ser 1.61 (H) 0.61 - 1.24 mg/dL   Calcium 9.1 8.9 - 09.6 mg/dL   Total Protein 8.9 (H) 6.5 - 8.1 g/dL   Albumin 2.3 (L) 3.5 - 5.0 g/dL   AST 23 15 - 41 U/L   ALT 13 0 - 44 U/L   Alkaline Phosphatase 137 (H) 38 - 126 U/L   Total Bilirubin 0.4 0.3 - 1.2 mg/dL   GFR, Estimated 42 (L) >60 mL/min    Comment: (NOTE) Calculated using the CKD-EPI Creatinine Equation (2021)    Anion gap 11 5 - 15    Comment: Performed at [facility], [address] [place], [place] [address]  Brain natriuretic peptide     Status: Abnormal   Collection Time: 03/18/23  3:49 PM  Result Value Ref Range   B Natriuretic Peptide 311.1 (H) 0.0 - 100.0 pg/mL    Comment: Performed at [facility], [address], [place], [place]  [address]  CBC with Differential     Status: Abnormal   Collection Time: 03/18/23  3:49 PM  Result Value Ref Range   WBC 11.0 (H) 4.0 - 10.5 K/uL   RBC 5.15 4.22 - 5.81 MIL/uL   Hemoglobin 13.7 13.0 - 17.0 g/dL   HCT 14.7 82.9 - 56.2 %   MCV 88.0 80.0 - 100.0 fL   MCH 26.6 26.0 - 34.0 pg   MCHC 30.2 30.0 - 36.0 g/dL   RDW 13.0 (H)  11.5 - 15.5 %   Platelets 232 150 - 400 K/uL   nRBC 0.0 0.0 - 0.2 %   Neutrophils Relative % 70 %   Neutro Abs 7.7 1.7 - 7.7 K/uL   Lymphocytes Relative 16 %   Lymphs Abs 1.7 0.7 - 4.0 K/uL   Monocytes Relative 13 %   Monocytes Absolute 1.4 (H) 0.1 - 1.0 K/uL   Eosinophils Relative 1 %   Eosinophils Absolute 0.1 0.0 - 0.5 K/uL   Basophils Relative 0 %   Basophils Absolute 0.0 0.0 - 0.1 K/uL   Immature Granulocytes 0 %   Abs Immature Granulocytes 0.04 0.00 - 0.07 K/uL    Comment: Performed at [facility], [address], [place], [place] [address]  Magnesium     Status: None   Collection Time: 03/18/23  3:49 PM  Result Value Ref Range   Magnesium 2.1 1.7 - 2.4 mg/dL    Comment: Performed at [facility], [address] [place], [place] [address]   No results found.  Pending Labs Unresulted Labs (From admission, onward)     Start     Ordered   03/18/23 1737  Potassium  Once,   STAT        03/18/23 1736            Vitals/Pain Today's Vitals   03/18/23 1505  BP: 131/81  Pulse: (!) 59  Resp: 20  Temp: 98.7 F (37.1 C)  TempSrc: Oral  SpO2: 98%    Isolation Precautions No active isolations  Medications Medications  sodium chloride 0.9 % bolus 500 mL (has no administration in time range)  sodium zirconium cyclosilicate (LOKELMA) packet 10 g (has no administration in time range)  albuterol (PROVENTIL) (2.5 MG/3ML) 0.083% nebulizer solution 10 mg (has no administration in time range)  insulin aspart (novoLOG) injection 5 Units (has no administration in time range)    And  dextrose 50 % solution 50 mL (has no  administration in time range)  sodium zirconium cyclosilicate (LOKELMA) packet 10 g (has no administration in time range)  apixaban (ELIQUIS) tablet 5 mg (has no administration in time range)  carvedilol (COREG) tablet 12.5 mg (has no administration in time range)  polyethylene glycol (MIRALAX / GLYCOLAX) packet 17 g (has no administration in time range)  multivitamin with minerals tablet 1 tablet (has no administration in time range)  rosuvastatin (CRESTOR) tablet 20 mg (has no administration in time range)  tamsulosin (FLOMAX) capsule 0.4 mg (has no administration in time range)  feeding supplement (ENSURE ENLIVE / ENSURE PLUS) liquid 237 mL (has no administration in time range)  acetaminophen (TYLENOL) tablet 650 mg (has no administration in time range)  ondansetron (ZOFRAN) injection 4 mg (has no administration in time range)  insulin aspart (novoLOG) injection 0-9 Units (has no administration in time range)  insulin aspart (novoLOG) injection 0-5 Units (has no administration in time range)  ipratropium-albuterol (DUONEB) 0.5-2.5 (3) MG/3ML nebulizer solution 3 mL (has no administration in time range)  0.9 %  sodium chloride infusion (has no administration in time range)    Mobility power wheelchair     Focused Assessments Cardiac Assessment Handoff:    Lab Results  Component Value Date   CKTOTAL 230 02/15/2020   TROPONINI 0.03 (HH) 02/25/2018   Lab Results  Component Value Date   DDIMER 1.40 (H) 02/25/2018   Does the Patient currently have chest pain? No    R Recommendations: See Admitting Provider Note  Report given to:   Additional Notes:

## 2023-03-18 NOTE — ED Triage Notes (Signed)
Pt from [facility] per staff pt had  k 6.3. Pt denies cp, sob, dizziness. No complaints at this time.

## 2023-03-18 NOTE — ED Provider Notes (Signed)
[place] EMERGENCY DEPARTMENT AT [facility] Provider Note   CSN: [id number] Arrival date & time: 03/18/23  1449     History  No chief complaint on file.   [name] is a 77 y.o. male.  HPI Patient presents with his daughter who assists with the history. Patient is at a rehab facility.  He notes that he has had some difficulty with initiating bowel movements, had a last normal 1 3 days ago, today with minimal production.  Otherwise no physical complaints, no weakness. He notes ongoing treatment for bilateral lower extremity wounds, has no concerns about ongoing status of his wounds, recently received wound care. He presents due to lab at his facility concerning for potassium 6.3. History is notable for prior episode of hyperkalemia requiring dialysis.    Home Medications Prior to Admission medications   Medication Sig Start Date End Date Taking? Authorizing Provider  acetaminophen (TYLENOL) 500 MG tablet Take 500 mg by mouth in the morning and at bedtime.    [provider]  apixaban (ELIQUIS) 5 MG TABS tablet Take 1 tablet (5 mg total) by mouth 2 (two) times daily. 07/14/22   Regalado, Belkys A, MD  Blood Glucose Monitoring Suppl (ACCU-CHEK GUIDE ME) w/Device KIT Use as directed twice per day E11.9 04/02/22   Corwin Levins, MD  carvedilol (COREG) 12.5 MG tablet Take 1 tablet (12.5 mg total) by mouth 2 (two) times daily with a meal. 08/23/22   Osvaldo Shipper, MD  feeding supplement (ENSURE ENLIVE / ENSURE PLUS) LIQD Take 237 mLs by mouth 3 (three) times daily between meals. 08/23/22   Osvaldo Shipper, MD  furosemide (LASIX) 40 MG tablet TAKE 1 TABLET BY MOUTH DAILY .  MAY TAKE AN ADDITIONAL 1 TABLET  BY MOUTH IF NEEDED FOR SWELLING Patient taking differently: Take 40 mg by mouth daily. TAKE 1 TABLET BY MOUTH DAILY .  MAY TAKE AN ADDITIONAL 1 TABLET  BY MOUTH IF NEEDED FOR SWELLING 03/10/22   Corwin Levins, MD  glucose blood (ACCU-CHEK GUIDE) test strip Use  as instructed twice per day E11.9 04/02/22   Corwin Levins, MD  guaiFENesin (ROBITUSSIN) 100 MG/5ML liquid Take 5 mLs by mouth every 4 (four) hours as needed for cough or to loosen phlegm. 08/23/22   Osvaldo Shipper, MD  Lancet Devices [facility] PLUS LANCING) MISC  09/19/20   [provider]  Lancets [facility] DELICA PLUS [place]) MISC USE ONCE DAILY 10/29/20   Corwin Levins, MD  magnesium oxide (MAG-OX) 400 (240 Mg) MG tablet Take 1 tablet (400 mg total) by mouth 2 (two) times daily. Patient taking differently: Take 400 mg by mouth daily. 08/23/22   Osvaldo Shipper, MD  Multiple Vitamin (MULTIVITAMIN WITH MINERALS) TABS tablet Take 1 tablet by mouth daily. 08/23/22   Osvaldo Shipper, MD  polyethylene glycol (MIRALAX / GLYCOLAX) 17 g packet Take 17 g by mouth daily as needed for mild constipation. 08/23/22   Osvaldo Shipper, MD  potassium chloride (KLOR-CON) 10 MEQ tablet Take 2 tablets (20 mEq total) by mouth daily. 07/08/22   Regalado, Belkys A, MD  PROAIR HFA 108 (90 Base) MCG/ACT inhaler INHALE 2 PUFFS INTO THE  LUNGS EVERY 6 HOURS AS  NEEDED Patient taking differently: Inhale 2 puffs into the lungs every 6 (six) hours as needed for shortness of breath or wheezing. 06/01/20   Corwin Levins, MD  rosuvastatin (CRESTOR) 20 MG tablet Take 20 mg by mouth every evening.    [provider]  tamsulosin (FLOMAX) 0.4 MG CAPS capsule Take 1 capsule (0.4 mg total) by mouth daily after supper. 07/08/22   Regalado, Jon Billings A, MD      Allergies    Ciprofloxacin    Review of Systems   Review of Systems  All other systems reviewed and are negative.   Physical Exam Updated Vital Signs BP 131/81 (BP Location: Right Arm)   Pulse (!) 59   Temp 98.7 F (37.1 C) (Oral)   Resp 20   SpO2 98%  Physical Exam Vitals and nursing note reviewed.  Constitutional:      General: He is not in acute distress.    Appearance: He is well-developed.  HENT:     Head: Normocephalic and atraumatic.   Eyes:     Conjunctiva/sclera: Conjunctivae normal.  Cardiovascular:     Rate and Rhythm: Normal rate and regular rhythm.  Pulmonary:     Effort: Pulmonary effort is normal. No respiratory distress.     Breath sounds: No stridor.  Abdominal:     General: There is no distension.     Palpations: There is no mass.     Tenderness: There is no abdominal tenderness. There is no guarding or rebound.     Hernia: No hernia is present.  Musculoskeletal:     Comments: Both lower extremities have wound care dressings in place.  There is some edema, but no distention.  Patient denies complaints in either of these extremities.  Skin:    General: Skin is warm and dry.  Neurological:     Mental Status: He is alert and oriented to person, place, and time.  Psychiatric:        Mood and Affect: Mood normal.        Behavior: Behavior normal.     ED Results / Procedures / Treatments   Labs (all labs ordered are listed, but only abnormal results are displayed) Labs Reviewed  COMPREHENSIVE METABOLIC PANEL - Abnormal; Notable for the following components:      Result Value   Sodium 131 (*)    Potassium 6.2 (*)    CO2 16 (*)    BUN 27 (*)    Creatinine, Ser 1.66 (*)    Total Protein 8.9 (*)    Albumin 2.3 (*)    Alkaline Phosphatase 137 (*)    GFR, Estimated 42 (*)    All other components within normal limits  BRAIN NATRIURETIC PEPTIDE - Abnormal; Notable for the following components:   B Natriuretic Peptide 311.1 (*)    All other components within normal limits  CBC WITH DIFFERENTIAL/PLATELET - Abnormal; Notable for the following components:   WBC 11.0 (*)    RDW 17.4 (*)    Monocytes Absolute 1.4 (*)    All other components within normal limits  MAGNESIUM    EKG EKG Interpretation Date/Time:  Wednesday March 18 2023 15:45:17 EDT Ventricular Rate:  55 PR Interval:    QRS Duration:  100 QT Interval:  460 QTC Calculation: 440 R Axis:   -49  Text Interpretation: Atrial  fibrillation with slow ventricular response Left anterior fascicular block Artifact Minimal voltage criteria for LVH, may be normal variant ( [place] product ) Possible Inferior infarct , age undetermined Confirmed by Gerhard Munch [phone]) on 03/18/2023 4:05:59 PM  Radiology No results found.  Procedures Procedures    Medications Ordered in ED Medications  sodium chloride 0.9 % bolus 500 mL (has no administration in time range)  sodium zirconium cyclosilicate (LOKELMA) packet  10 g (has no administration in time range)  albuterol (PROVENTIL) (2.5 MG/3ML) 0.083% nebulizer solution 10 mg (has no administration in time range)  insulin aspart (novoLOG) injection 5 Units (has no administration in time range)    And  dextrose 50 % solution 50 mL (has no administration in time range)    ED Course/ Medical Decision Making/ A&P                                 Medical Decision Making Elderly male with multiple medical issues including current placement in a rehabilitation facility [facility] with concern for hyperkalemia as well as personal concern for decreased bowel movements.  Given his substantial history including prior episodes hyperkalemia requiring dialysis, with ongoing management of A-fib, hyperlipidemia, hypertension, electrolyte abnormalities are concerning, repeat labs sent, monitoring started. Cardiac 60 A-fib abnormal Pulse ox 100% room air normal  Amount and/or Complexity of Data Reviewed Independent Historian:     Details: Adult child who is a nurse at bedside. External Data Reviewed: notes. Labs: ordered. Decision-making details documented in ED Course. Radiology: ordered and independent interpretation performed. Decision-making details documented in ED Course. ECG/medicine tests: ordered and independent interpretation performed. Decision-making details documented in ED Course.  Risk OTC drugs. Prescription drug management. Decision regarding hospitalization. Diagnosis or  treatment significantly limited by social determinants of health.   5:07 PM On repeat exam patient in similar condition.  I discussed the pertinent labs with the patient's daughter.  Potassium 6.2, ECG grossly unchanged.  With critically abnormal potassium value patient will start calcium insulin dextrose albuterol Lokelma.  With an otherwise reassuring physical exam no additional advanced imaging currently indicated.  Given the need for interventions for his hyperkalemia, continuous monitoring, I discussed patient's case with our internal medicine colleagues, patient admitted for ongoing efforts.          Final Clinical Impression(s) / ED Diagnoses Final diagnoses:  Hyperkalemia  CRITICAL CARE Performed by: Gerhard Munch Total critical care time: 35 minutes Critical care time was exclusive of separately billable procedures and treating other patients. Critical care was necessary to treat or prevent imminent or life-threatening deterioration. Critical care was time spent personally by me on the following activities: development of treatment plan with patient and/or surrogate as well as nursing, discussions with consultants, evaluation of patient's response to treatment, examination of patient, obtaining history from patient or surrogate, ordering and performing treatments and interventions, ordering and review of laboratory studies, ordering and review of radiographic studies, pulse oximetry and re-evaluation of patient's condition.    Gerhard Munch, MD 03/18/23 [phone]

## 2023-03-19 DIAGNOSIS — Z87891 Personal history of nicotine dependence: Secondary | ICD-10-CM | POA: Diagnosis not present

## 2023-03-19 DIAGNOSIS — Z86711 Personal history of pulmonary embolism: Secondary | ICD-10-CM | POA: Diagnosis not present

## 2023-03-19 DIAGNOSIS — E669 Obesity, unspecified: Secondary | ICD-10-CM | POA: Diagnosis present

## 2023-03-19 DIAGNOSIS — E11622 Type 2 diabetes mellitus with other skin ulcer: Secondary | ICD-10-CM | POA: Diagnosis not present

## 2023-03-19 DIAGNOSIS — Z7401 Bed confinement status: Secondary | ICD-10-CM | POA: Diagnosis not present

## 2023-03-19 DIAGNOSIS — E875 Hyperkalemia: Secondary | ICD-10-CM | POA: Diagnosis not present

## 2023-03-19 DIAGNOSIS — J449 Chronic obstructive pulmonary disease, unspecified: Secondary | ICD-10-CM | POA: Diagnosis present

## 2023-03-19 DIAGNOSIS — I890 Lymphedema, not elsewhere classified: Secondary | ICD-10-CM | POA: Diagnosis not present

## 2023-03-19 DIAGNOSIS — Z833 Family history of diabetes mellitus: Secondary | ICD-10-CM | POA: Diagnosis not present

## 2023-03-19 DIAGNOSIS — E861 Hypovolemia: Secondary | ICD-10-CM | POA: Diagnosis present

## 2023-03-19 DIAGNOSIS — Z8249 Family history of ischemic heart disease and other diseases of the circulatory system: Secondary | ICD-10-CM | POA: Diagnosis not present

## 2023-03-19 DIAGNOSIS — I5032 Chronic diastolic (congestive) heart failure: Secondary | ICD-10-CM | POA: Diagnosis not present

## 2023-03-19 DIAGNOSIS — E1140 Type 2 diabetes mellitus with diabetic neuropathy, unspecified: Secondary | ICD-10-CM | POA: Diagnosis present

## 2023-03-19 DIAGNOSIS — I499 Cardiac arrhythmia, unspecified: Secondary | ICD-10-CM | POA: Diagnosis not present

## 2023-03-19 DIAGNOSIS — Z8042 Family history of malignant neoplasm of prostate: Secondary | ICD-10-CM | POA: Diagnosis not present

## 2023-03-19 DIAGNOSIS — I110 Hypertensive heart disease with heart failure: Secondary | ICD-10-CM | POA: Diagnosis present

## 2023-03-19 DIAGNOSIS — L97919 Non-pressure chronic ulcer of unspecified part of right lower leg with unspecified severity: Secondary | ICD-10-CM | POA: Diagnosis not present

## 2023-03-19 DIAGNOSIS — Z7901 Long term (current) use of anticoagulants: Secondary | ICD-10-CM | POA: Diagnosis not present

## 2023-03-19 DIAGNOSIS — R5381 Other malaise: Secondary | ICD-10-CM | POA: Diagnosis present

## 2023-03-19 DIAGNOSIS — E871 Hypo-osmolality and hyponatremia: Secondary | ICD-10-CM | POA: Diagnosis present

## 2023-03-19 DIAGNOSIS — I4819 Other persistent atrial fibrillation: Secondary | ICD-10-CM | POA: Diagnosis present

## 2023-03-19 DIAGNOSIS — Z6830 Body mass index (BMI) 30.0-30.9, adult: Secondary | ICD-10-CM | POA: Diagnosis not present

## 2023-03-19 DIAGNOSIS — T502X5A Adverse effect of carbonic-anhydrase inhibitors, benzothiadiazides and other diuretics, initial encounter: Secondary | ICD-10-CM | POA: Diagnosis present

## 2023-03-19 DIAGNOSIS — M160 Bilateral primary osteoarthritis of hip: Secondary | ICD-10-CM | POA: Diagnosis present

## 2023-03-19 DIAGNOSIS — Z8546 Personal history of malignant neoplasm of prostate: Secondary | ICD-10-CM | POA: Diagnosis not present

## 2023-03-19 DIAGNOSIS — N400 Enlarged prostate without lower urinary tract symptoms: Secondary | ICD-10-CM | POA: Diagnosis present

## 2023-03-19 DIAGNOSIS — E785 Hyperlipidemia, unspecified: Secondary | ICD-10-CM | POA: Diagnosis present

## 2023-03-19 DIAGNOSIS — Z888 Allergy status to other drugs, medicaments and biological substances status: Secondary | ICD-10-CM | POA: Diagnosis not present

## 2023-03-19 DIAGNOSIS — N179 Acute kidney failure, unspecified: Secondary | ICD-10-CM

## 2023-03-19 LAB — GLUCOSE, CAPILLARY
Glucose-Capillary: 94 mg/dL (ref 70–99)
Glucose-Capillary: 119 mg/dL — ABNORMAL HIGH (ref 70–99)
Glucose-Capillary: 178 mg/dL — ABNORMAL HIGH (ref 70–99)
Glucose-Capillary: 107 mg/dL — ABNORMAL HIGH (ref 70–99)

## 2023-03-19 LAB — BASIC METABOLIC PANEL
Anion gap: 6 (ref 5–15)
BUN: 24 mg/dL — ABNORMAL HIGH (ref 8–23)
CO2: 21 mmol/L — ABNORMAL LOW (ref 22–32)
Calcium: 8.5 mg/dL — ABNORMAL LOW (ref 8.9–10.3)
Chloride: 104 mmol/L (ref 98–111)
Creatinine, Ser: 2 mg/dL — ABNORMAL HIGH (ref 0.61–1.24)
GFR, Estimated: 34 mL/min — ABNORMAL LOW (ref >60)
Glucose, Bld: 125 mg/dL — ABNORMAL HIGH (ref 70–99)
Potassium: 4.9 mmol/L (ref 3.5–5.1)
Sodium: 131 mmol/L — ABNORMAL LOW (ref 135–145)

## 2023-03-19 LAB — POTASSIUM: Potassium: 5.8 mmol/L — ABNORMAL HIGH (ref 3.5–5.1)

## 2023-03-19 MED ORDER — HYDROCORTISONE (PERIANAL) 2.5 % EX CREA
TOPICAL_CREAM | Freq: Two times a day (BID) | CUTANEOUS | Status: DC
  Filled 2023-03-19: qty 28.35

## 2023-03-19 MED ORDER — GABAPENTIN 100 MG PO CAPS
100.0000 mg | ORAL_CAPSULE | Freq: Two times a day (BID) | ORAL | Status: DC
  Administered 2023-03-19 – 2023-03-20 (×3): 100 mg via ORAL
  Filled 2023-03-19 (×3): qty 1

## 2023-03-19 MED ORDER — SODIUM CHLORIDE 0.9 % IV SOLN: INTRAVENOUS | Status: AC

## 2023-03-19 NOTE — Plan of Care (Signed)

## 2023-03-19 NOTE — Consult Note (Addendum)
WOC Nurse Consult Note: patient with history of lymphedema; patient currently resides at [facility] and says he gets wound care there  Reason for Consult: lower leg wounds  Wound type: Full thickness R plantar foot; no other wounds found to either leg, no wound L foot; likely diabetic ulcer to R plantar foot, some discoloration to R lower anterior lower leg that may have been venous ulcer in past but not open today  Pressure Injury POA: NA  Measurement: R plantar foot 1.5 cm x 1 cm 100% dry brown eschar   Drainage (amount, consistency, odor) none  Periwound: intact  Dressing procedure/placement/frequency: Clean R plantar foot wound with NS, apply Betadine daily and allow to dry. May cover with silicone foam if desired.  Cover R anterior lower leg with single layer Xeroform gauze, ABD pad and wrap R leg beginning just above toes and ending right below knees with Kerlix roll gauze. May apply Ace bandage wrapped in same fashion as Kerlix for some light compression.    POC discussed with bedside nurse. WOC team will not follow at this time. Re-consult if further needs arise.   Thank you,     Priscella Mann MSN, RN-BC, [employer] [phone]

## 2023-03-19 NOTE — Progress Notes (Signed)
PROGRESS NOTE        PATIENT DETAILS Name: [name] Age: 77 y.o. Sex: male Date of Birth: 1945-12-05 Admit Date: 03/18/2023 Admitting Physician [name], MD CZY:SAYT, [name], MD  Brief Summary: Patient is a 77 y.o.  male with history of chronic HFpEF, A-fib on Eliquis, HTN, chronic bilateral lower extremity lymphedema-who was transferred from SNF for evaluation of hyperkalemia.  Significant events: 8/28>> admit to [facility]  Significant studies: 8/28>> x-ray chest: No PNA  Significant microbiology data: None  Procedures: None  Consults: None  Subjective: Lying comfortably in bed-denies any chest pain or shortness of breath.  Objective: Vitals: Blood pressure (!) 140/58, pulse (!) 48, temperature 98.3 F (36.8 C), temperature source Oral, resp. rate 16, weight 105 kg, SpO2 (!) 87%.   Exam: Gen Exam:Alert awake-not in any distress HEENT:atraumatic, normocephalic Chest: B/L clear to auscultation anteriorly CVS:S1S2 regular Abdomen:soft non tender, non distended Extremities:++ edema Neurology: Non focal Skin: no rash  Pertinent Labs/Radiology:    Latest Ref Rng & Units 03/18/2023    3:49 PM 08/20/2022    5:39 AM 08/19/2022    1:36 AM  CBC  WBC 4.0 - 10.5 K/uL 11.0  5.2  3.5   Hemoglobin 13.0 - 17.0 g/dL 01.6  01.0  93.2   Hematocrit 39.0 - 52.0 % 45.3  41.3  47.3   Platelets 150 - 400 K/uL 232  132  116     Lab Results  Component Value Date   NA 131 (L) 03/19/2023   K 4.9 03/19/2023   CL 104 03/19/2023   CO2 21 (L) 03/19/2023      Assessment/Plan: Hyperkalemia Likely secondary to mild AKI-Aldactone/potassium supplementation Has resolved with supportive care and Lokelma Recheck electrolytes tomorrow  AKI Likely hemodynamically mediated Unfortunate creatinine continues to increase Check bladder scans to ensure no urinary retention Gently hydrate with IVF for several hours today Avoid nephrotoxic agents Recheck  electrolytes tomorrow If no improvement-will need further workup.  Chronic HFpEF Chronic lower extremity lymphedema Stable volume status Holding diuretics Compressive stockings  Chronic atrial fibrillation Coreg/Eliquis Telemetry monitoring  History of pulmonary embolism  Eliquis  DM-2 CBGs stable with SSI  HLD Statin  BPH Flomax Frequent bladder scans  COPD Stable Bronchodilators  Peripheral neuropathy Presumably related to DM Neurontin  Debility/deconditioning SNF resident PT/OT eval  Obesity: Estimated body mass index is 30.54 kg/m as calculated from the following:   Height as of 09/15/22: 6\' 1"  (1.854 m).   Weight as of this encounter: 105 kg.   Code status:   Code Status: Full Code   DVT Prophylaxis: apixaban (ELIQUIS) tablet 5 mg    Family Communication: None at bedside   Disposition Plan: Status is: Observation The patient will require care spanning > 2 midnights and should be moved to inpatient because:    Planned Discharge Destination:SNF   Diet: Diet Order             Diet Carb Modified Fluid consistency: Thin; Room service appropriate? Yes  Diet effective now                     Antimicrobial agents: Anti-infectives (From admission, onward)    None        MEDICATIONS: Scheduled Meds:  apixaban  5 mg Oral BID   carvedilol  12.5 mg Oral BID WC  feeding supplement  237 mL Oral TID BM   gabapentin  100 mg Oral BID   insulin aspart  0-5 Units Subcutaneous QHS   insulin aspart  0-9 Units Subcutaneous TID WC   multivitamin with minerals  1 tablet Oral Daily   rosuvastatin  20 mg Oral QPM   tamsulosin  0.4 mg Oral QPC supper   Continuous Infusions:  sodium chloride 75 mL/hr at 03/18/23 2254   PRN Meds:.acetaminophen, ipratropium-albuterol, ondansetron (ZOFRAN) IV, polyethylene glycol   I have personally reviewed following labs and imaging studies  LABORATORY DATA: CBC: Recent Labs  Lab 03/18/23 1549  WBC  11.0*  NEUTROABS 7.7  HGB 13.7  HCT 45.3  MCV 88.0  PLT 232    Basic Metabolic Panel: Recent Labs  Lab 03/18/23 1549 03/18/23 2120 03/19/23 0012 03/19/23 0701  NA 131*  --   --  131*  K 6.2* 6.7* 5.8* 4.9  CL 104  --   --  104  CO2 16*  --   --  21*  GLUCOSE 98  --   --  125*  BUN 27*  --   --  24*  CREATININE 1.66*  --   --  2.00*  CALCIUM 9.1  --   --  8.5*  MG 2.1  --   --   --     GFR: CrCl cannot be calculated (Unknown ideal weight.).  Liver Function Tests: Recent Labs  Lab 03/18/23 1549  AST 23  ALT 13  ALKPHOS 137*  BILITOT 0.4  PROT 8.9*  ALBUMIN 2.3*   No results for input(s): "LIPASE", "AMYLASE" in the last 168 hours. No results for input(s): "AMMONIA" in the last 168 hours.  Coagulation Profile: No results for input(s): "INR", "PROTIME" in the last 168 hours.  Cardiac Enzymes: No results for input(s): "CKTOTAL", "CKMB", "CKMBINDEX", "TROPONINI" in the last 168 hours.  BNP (last 3 results) No results for input(s): "PROBNP" in the last 8760 hours.  Lipid Profile: No results for input(s): "CHOL", "HDL", "LDLCALC", "TRIG", "CHOLHDL", "LDLDIRECT" in the last 72 hours.  Thyroid Function Tests: No results for input(s): "TSH", "T4TOTAL", "FREET4", "T3FREE", "THYROIDAB" in the last 72 hours.  Anemia Panel: No results for input(s): "VITAMINB12", "FOLATE", "FERRITIN", "TIBC", "IRON", "RETICCTPCT" in the last 72 hours.  Urine analysis:    Component Value Date/Time   COLORURINE YELLOW 08/16/2022 1523   APPEARANCEUR CLEAR 08/16/2022 1523   LABSPEC 1.012 08/16/2022 1523   PHURINE 6.0 08/16/2022 1523   GLUCOSEU NEGATIVE 08/16/2022 1523   GLUCOSEU NEGATIVE 06/25/2022 1432   HGBUR SMALL (A) 08/16/2022 1523   BILIRUBINUR NEGATIVE 08/16/2022 1523   KETONESUR NEGATIVE 08/16/2022 1523   PROTEINUR >=300 (A) 08/16/2022 1523   UROBILINOGEN 0.2 06/25/2022 1432   NITRITE NEGATIVE 08/16/2022 1523   LEUKOCYTESUR NEGATIVE 08/16/2022 1523    Sepsis  Labs: Lactic Acid, Venous    Component Value Date/Time   LATICACIDVEN 2.5 (HH) 08/16/2022 1726    MICROBIOLOGY: No results found for this or any previous visit (from the past 240 hour(s)).  RADIOLOGY STUDIES/RESULTS: DG Chest 1 View  Result Date: 03/18/2023 CLINICAL DATA:  Bilateral lower extremity edema. EXAM: CHEST  1 VIEW COMPARISON:  Two-view chest x-ray 01/15/2023 FINDINGS: Heart is enlarged. No edema or effusion is present. Lung volumes are low. The visualized soft tissues and bony thorax are unremarkable. IMPRESSION: 1. Cardiomegaly without failure. 2. Low lung volumes. Electronically Signed   By: Marin Roberts M.D.   On: 03/18/2023 18:34     LOS:  0 days   Jeoffrey Massed, MD  [facility]    To contact the attending provider between 7A-7P or the covering provider during after hours 7P-7A, please log into the web site [email or web address] and access using universal  password for that web site. If you do not have the password, please call the hospital operator.  03/19/2023, 11:36 AM

## 2023-03-19 NOTE — Progress Notes (Signed)
Potassium level was redrawn and EKG done.

## 2023-03-19 NOTE — Progress Notes (Signed)
Dr. Julian Reil was made aware that pt K+ was 6.7. Pt had 12 beats of Vtach, but denied CP, SOB, and palpitation. VSS.

## 2023-03-20 DIAGNOSIS — E875 Hyperkalemia: Secondary | ICD-10-CM | POA: Diagnosis not present

## 2023-03-20 DIAGNOSIS — L97919 Non-pressure chronic ulcer of unspecified part of right lower leg with unspecified severity: Secondary | ICD-10-CM

## 2023-03-20 DIAGNOSIS — E11622 Type 2 diabetes mellitus with other skin ulcer: Secondary | ICD-10-CM | POA: Diagnosis not present

## 2023-03-20 LAB — GLUCOSE, CAPILLARY
Glucose-Capillary: 97 mg/dL (ref 70–99)
Glucose-Capillary: 95 mg/dL (ref 70–99)
Glucose-Capillary: 109 mg/dL — ABNORMAL HIGH (ref 70–99)

## 2023-03-20 LAB — BASIC METABOLIC PANEL
Anion gap: 9 (ref 5–15)
BUN: 20 mg/dL (ref 8–23)
CO2: 19 mmol/L — ABNORMAL LOW (ref 22–32)
Calcium: 8.8 mg/dL — ABNORMAL LOW (ref 8.9–10.3)
Chloride: 105 mmol/L (ref 98–111)
Creatinine, Ser: 1.44 mg/dL — ABNORMAL HIGH (ref 0.61–1.24)
GFR, Estimated: 50 mL/min — ABNORMAL LOW (ref >60)
Glucose, Bld: 96 mg/dL (ref 70–99)
Potassium: 5.1 mmol/L (ref 3.5–5.1)
Sodium: 133 mmol/L — ABNORMAL LOW (ref 135–145)

## 2023-03-20 MED ORDER — FUROSEMIDE 40 MG PO TABS
40.0000 mg | ORAL_TABLET | Freq: Every day | ORAL | Status: DC
  Administered 2023-03-20: 40 mg via ORAL
  Filled 2023-03-20: qty 1

## 2023-03-20 MED ORDER — NOVOLOG FLEXPEN 100 UNIT/ML ~~LOC~~ SOPN: PEN_INJECTOR | SUBCUTANEOUS | Status: DC

## 2023-03-20 MED ORDER — HYDROCORTISONE (PERIANAL) 2.5 % EX CREA: TOPICAL_CREAM | Freq: Two times a day (BID) | CUTANEOUS | Status: AC

## 2023-03-20 NOTE — Plan of Care (Signed)
  Problem: Education: Goal: Ability to describe self-care measures that may prevent or decrease complications (Diabetes Survival Skills Education) will improve Outcome: Progressing Goal: Individualized Educational Video(s) Outcome: Progressing   Problem: Coping: Goal: Ability to adjust to condition or change in health will improve Outcome: Progressing   Problem: Fluid Volume: Goal: Ability to maintain a balanced intake and output will improve Outcome: Progressing   Problem: Health Behavior/Discharge Planning: Goal: Ability to identify and utilize available resources and services will improve Outcome: Progressing Goal: Ability to manage health-related needs will improve Outcome: Progressing   Problem: Nutritional: Goal: Maintenance of adequate nutrition will improve Outcome: Progressing Goal: Progress toward achieving an optimal weight will improve Outcome: Progressing   Problem: Metabolic: Goal: Ability to maintain appropriate glucose levels will improve Outcome: Progressing

## 2023-03-20 NOTE — TOC Transition Note (Signed)
Transition of Care [facility]) - CM/SW Discharge Note   Patient Details  Name: [name] MRN: [id number] Date of Birth: 1946-06-29  [facility] CM/SW Contact:  Mearl Latin, LCSW Phone Number: 03/20/2023, 1:04 PM   Clinical Narrative:    Patient will DC to: [facility] Anticipated DC date: 03/20/23 Family notified: Daughters Transport by: Sharin Mons   Per MD patient ready for DC to [place]. RN to call report prior to discharge [phone] room 1103a). RN, patient, patient's family, and facility notified of DC. Discharge Summary and FL2 sent to facility. DC packet on chart. Ambulance transport requested for patient.   CSW will sign off for now as social work intervention is no longer needed. Please consult [place] again if new needs arise.     Final next level of care: Skilled Nursing Facility Barriers to Discharge: Barriers Resolved   Patient Goals and CMS Choice CMS Medicare.gov Compare Post Acute Care list provided to:: Patient Choice offered to / list presented to : Patient  Discharge Placement     Existing PASRR number confirmed : 03/20/23          Patient [facility] bed at[facility] Patient to be transferred to facility by: [facility] Name of family member notified: Daughters Patient and family notified of of transfer: 03/20/23  Discharge Plan and Services Additional resources added to the After Visit Summary for   In-house Referral: Clinical Social Work   Post Acute Care Choice: Skilled Nursing Facility                               Social Determinants of Health (SDOH) Interventions SDOH Screenings   Food Insecurity: No Food Insecurity (07/10/2022)  Housing: Low Risk  (07/10/2022)  Transportation Needs: No Transportation Needs (07/10/2022)  Utilities: Not At Risk (07/05/2022)  Depression (PHQ2-9): Low Risk  (08/07/2022)  Tobacco Use: Medium Risk (10/06/2022)     Readmission Risk Interventions     No data to display

## 2023-03-20 NOTE — TOC Initial Note (Addendum)
Transition of Care [facility]) - Initial/Assessment Note    Patient Details  Name: [name] MRN: [id number] Date of Birth: 25-Nov-1945  Transition of Care [facility]) CM/SW Contact:    Mearl Latin, LCSW Phone Number: 03/20/2023, 9:15 AM  Clinical Narrative:                 Patient was admitted from [facility] long term care. CSW sent [facility] Discharge paperwork and left voicemail for patient's daughter. He will require PTAR for transport.   Per RN, patient's daughter is bringing him clothes.   Update: Kasandra Knudsen contacted CSW and stated her sister is at bedside and brought clothes for the patient.   Expected Discharge Plan: Skilled Nursing Facility Barriers to Discharge: Barriers Resolved   Patient Goals and CMS Choice Patient states their goals for this hospitalization and ongoing recovery are:: Return to SNF CMS Medicare.gov Compare Post Acute Care list provided to:: Patient Choice offered to / list presented to : Patient [place] ownership interest in [facility].provided to:: Patient    Expected Discharge Plan and Services In-house Referral: Clinical Social Work   Post Acute Care Choice: Skilled Nursing Facility Living arrangements for the past 2 months: Skilled Nursing Facility Expected Discharge Date: 03/20/23                                    Prior Living Arrangements/Services Living arrangements for the past 2 months: Skilled Nursing Facility Lives with:: Facility Resident   Do you feel safe going back to the place where you live?: Yes      Need for Family Participation in Patient Care: No (Comment) Care giver support system in place?: Yes (comment)   Criminal Activity/Legal Involvement Pertinent to Current Situation/Hospitalization: No - Comment as needed  Activities of Daily Living      Permission Sought/Granted Permission sought to share information with : Facility Industrial/product designer granted to share information with : Yes,  Verbal Permission Granted  Share Information with NAME: Kasandra Knudsen  Permission granted to share info w AGENCY: [facility]  Permission granted to share info w Relationship: Daughter  Permission granted to share info w Contact Information: [phone]  Emotional Assessment Appearance:: Appears stated age Attitude/Demeanor/Rapport: Engaged Affect (typically observed): Accepting, Appropriate Orientation: : Oriented to Self, Oriented to  Time, Oriented to Place, Oriented to Situation Alcohol / Substance Use: Not Applicable Psych Involvement: No (comment)  Admission diagnosis:  Hyperkalemia [E87.5] Acute hyperkalemia [E87.5] Patient Active Problem List   Diagnosis Date Noted   Acute hyperkalemia 03/18/2023   Influenza A 08/16/2022   History of pulmonary embolism 08/16/2022   Acute metabolic encephalopathy 08/16/2022   MCI (mild cognitive impairment) 08/16/2022   Influenza 08/16/2022   Hypokalemia 08/07/2022   Physical deconditioning 08/07/2022   Bilateral pulmonary embolism (HCC) 07/05/2022   UTI (urinary tract infection) 07/04/2022   Urinary tract infection without hematuria 06/28/2022   Confusion 06/28/2022   Hypercoagulable state due to longstanding persistent atrial fibrillation (HCC) 06/08/2022   Foul smelling urine 04/30/2022   Nausea and vomiting 04/30/2022   SDH (subdural hematoma) (HCC) 04/17/2022   Subdural hematoma (HCC) 04/04/2022   New daily persistent headache 04/02/2022   COVID-19 01/26/2022   Hypomagnesemia 01/25/2022   CVA (cerebral vascular accident) (HCC) 01/24/2022   Cough 01/24/2022   Elevated troponin 01/24/2022   Fever 01/24/2022   Bradycardia 11/21/2021   Vitamin D deficiency 11/21/2021   Other fatigue 11/18/2021  Bilateral leg edema 07/08/2021   Gait disorder 12/16/2020   HTN (hypertension) 12/16/2020   Aortic atherosclerosis (HCC) 12/12/2020   Diverticulitis 10/23/2020   Fall 10/22/2020   Sepsis (HCC) 10/22/2020   Acute lower UTI 10/22/2020   NSVT  (nonsustained ventricular tachycardia) (HCC)    Multiple myeloma (HCC)    AKI (acute kidney injury) (HCC) 02/15/2020   General weakness    Diabetic leg ulcer (HCC) 10/06/2019   Chronic kidney disease, stage 3b (HCC) 10/06/2019   Diabetic ulcer of right lower leg (HCC) 02/23/2019   Abnormal LFTs 04/27/2018   Open wound of scrotum 03/01/2018   Acute on chronic diastolic CHF (congestive heart failure) (HCC) 02/25/2018   Postoperative anemia due to acute blood loss 02/25/2018   Occipital stroke (HCC), left 09/23/2017   Diabetes mellitus type II, non insulin dependent (HCC)    Encounter for well adult exam with abnormal findings 07/31/2017   Venous insufficiency (chronic) (peripheral) with chronic lower extremity edema 04/04/2016   Junctional tachycardia 03/18/2015   Exertional dyspnea 07/05/2014   Obesity (BMI 30-39.9) 05/04/2014   Benign essential tremor 05/04/2014   Peripheral neuropathy 12/21/2013   Nonspecific abnormal finding in stool contents 09/06/2012   Heme positive stool 08/09/2012   Difficulty passing stool 08/09/2012   Sebaceous cyst 04/15/2012   Complex renal cyst 01/13/2011   Back pain 01/02/2011   Permanent atrial fibrillation (HCC) - now off amiodarone; asymptomatic. CHA2DS2-VASc Score -5 (on Xarelto) 01/02/2011   KNEE PAIN, LEFT 08/27/2009   COLONIC POLYPS, HX OF 08/08/2008   ERECTILE DYSFUNCTION 09/27/2007   ULNAR NEUROPATHY 09/27/2007   VENTRICULAR HYPERTROPHY, LEFT 09/27/2007   ALLERGIC RHINITIS 09/27/2007   Arthropathy 09/27/2007   GANGLION CYST, WRIST, LEFT 09/27/2007   HEMORRHOIDS, INTERNAL 06/10/2007   DIVERTICULOSIS, COLON 06/10/2007   DISC DISEASE, LUMBAR 06/03/2007   NEPHROLITHIASIS, HX OF 06/03/2007   Diabetes mellitus with neuropathy (HCC) 02/11/2007   Hyperlipidemia with target LDL less than 100 02/11/2007   ANXIETY 02/11/2007   Nondependent Alcohol Abuse, in Remission 02/11/2007   Hypertensive heart disease with chronic diastolic congestive heart  failure (HCC) 02/11/2007   COPD (chronic obstructive pulmonary disease) (HCC) 02/11/2007   BPH (benign prostatic hyperplasia) 02/11/2007   LOW BACK PAIN 02/11/2007   Disturbance of skin sensation 02/11/2007   PROSTATE CANCER, HX OF 02/11/2007   PCP:  Corwin Levins, MD Pharmacy:   [id number] - [address] ([place]), [place] - [address] [place] ([place]) [place] [address] Phone: [phone] Fax: [phone]     Social Determinants of Health (SDOH) Social History: SDOH Screenings   Food Insecurity: No Food Insecurity (07/10/2022)  Housing: Low Risk  (07/10/2022)  Transportation Needs: No Transportation Needs (07/10/2022)  Utilities: Not At Risk (07/05/2022)  Depression (PHQ2-9): Low Risk  (08/07/2022)  Tobacco Use: Medium Risk (10/06/2022)   SDOH Interventions:     Readmission Risk Interventions     No data to display

## 2023-03-20 NOTE — Evaluation (Signed)
Occupational Therapy Evaluation Patient Details Name: [name] MRN: [id number] DOB: 1945-12-10 Today's Date: 03/20/2023   History of Present Illness 77 y.o. male admitted 8/28 from [facility] with hyperkalemia. PMHx: T2DM,  neuropathy, HLD, A-fib on Eliquis, HTN, OA of both hips, COPD, erectile dysfunction, prostate CA, CVA without residual weakness, peripheral neuropathy, lymphedema   Clinical Impression   Pt is long term care resident at [facility] and is at baseline PLOF. Pt to return to SNF once medically ready. No further acute OT services are indicated at this time, any further OT intervention is deferred to his SNF. OT will sign off      If plan is discharge home, recommend the following: Direct supervision/assist for medications management;A lot of help with bathing/dressing/bathroom;Other (comment) (mechanical lift)    Functional Status Assessment  Patient has not had a recent decline in their functional status  Equipment Recommendations  None recommended by OT    Recommendations for Other Services       Precautions / Restrictions Precautions Precautions: Fall Restrictions Weight Bearing Restrictions: No      Mobility Bed Mobility Overal bed mobility: Needs Assistance Bed Mobility: Rolling, Supine to Sit, Sit to Supine Rolling: Max assist   Supine to sit: Max assist Sit to supine: Max assist        Transfers                   General transfer comment: needs Stedy or hoyer lift for safety. hoyer is used at [employer] at [facility]      Balance Overall balance assessment: Needs assistance Sitting-balance support: Bilateral upper extremity supported Sitting balance-Leahy Scale: Fair                                     ADL either performed or assessed with clinical judgement   ADL Overall ADL's : At baseline;Needs assistance/impaired                                       General ADL Comments: pt at baseline  in total A with most ADLs. Pt able to feed himself, wash face and hands with set up     Vision Baseline Vision/History: 1 Wears glasses Ability to See in Adequate Light: 0 Adequate Patient Visual Report: No change from baseline       Perception         Praxis         Pertinent Vitals/Pain Pain Assessment Pain Assessment: Faces Faces Pain Scale: Hurts little more Pain Location: with bed mobility Pain Descriptors / Indicators: Grimacing, Moaning Pain Intervention(s): Monitored during session, Limited activity within patient's tolerance, Repositioned     Extremity/Trunk Assessment Upper Extremity Assessment Upper Extremity Assessment: Overall WFL for tasks assessed   Lower Extremity Assessment Lower Extremity Assessment: Defer to PT evaluation       Communication     Cognition Arousal: Alert Behavior During Therapy: [facility] for tasks assessed/performed Overall Cognitive Status: Within Functional Limits for tasks assessed                                       General Comments       Exercises     Shoulder Instructions  Home Living Family/patient expects to be discharged to:: Skilled nursing facility                                        Prior Functioning/Environment Prior Level of Function : Needs assist             Mobility Comments: pt reports SNF staff uses hoyer to transfer him to w/c ADLs Comments: pt reports that SNF staff provided total A with bathing, dressing and toileting but that he can groom and feed himself        OT Problem List: Decreased strength;Impaired balance (sitting and/or standing);Pain;Decreased activity tolerance      OT Treatment/Interventions:      OT Goals(Current goals can be found in the care plan section) Acute Rehab OT Goals Patient Stated Goal: " go back to nursing home"  OT Frequency:      Co-evaluation              AM-PAC OT "6 Clicks" Daily Activity     Outcome Measure  Help from another person eating meals?: None Help from another person taking care of personal grooming?: A Little Help from another person toileting, which includes using toliet, bedpan, or urinal?: Total Help from another person bathing (including washing, rinsing, drying)?: Total Help from another person to put on and taking off regular upper body clothing?: Total Help from another person to put on and taking off regular lower body clothing?: Total 6 Click Score: 11   End of Session    Activity Tolerance: Patient limited by fatigue Patient left: in bed;with bed alarm set  OT Visit Diagnosis: Muscle weakness (generalized) (M62.81);Other abnormalities of gait and mobility (R26.89);Pain Pain - part of body:  (generalized)                Time: 7829-5621 OT Time Calculation (min): 14 min Charges:  OT General Charges $OT Visit: 1 Visit OT Evaluation $OT Eval Moderate Complexity: 1 Mod    Galen Manila 03/20/2023, 1:17 PM

## 2023-03-20 NOTE — Plan of Care (Signed)
  Problem: Education: Goal: Ability to describe self-care measures that may prevent or decrease complications (Diabetes Survival Skills Education) will improve Outcome: Progressing Goal: Individualized Educational Video(s) Outcome: Progressing   Problem: Coping: Goal: Ability to adjust to condition or change in health will improve Outcome: Progressing   Problem: Health Behavior/Discharge Planning: Goal: Ability to identify and utilize available resources and services will improve Outcome: Progressing Goal: Ability to manage health-related needs will improve Outcome: Progressing   Problem: Nutritional: Goal: Maintenance of adequate nutrition will improve Outcome: Progressing Goal: Progress toward achieving an optimal weight will improve Outcome: Progressing   Problem: Skin Integrity: Goal: Risk for impaired skin integrity will decrease Outcome: Progressing   Problem: Tissue Perfusion: Goal: Adequacy of tissue perfusion will improve Outcome: Progressing   Problem: Health Behavior/Discharge Planning: Goal: Ability to manage health-related needs will improve Outcome: Progressing

## 2023-03-20 NOTE — [place] FL2 (Signed)
[place] MEDICAID FL2 LEVEL OF CARE FORM     IDENTIFICATION  Patient Name: [name] Birthdate: 1945-11-24 Sex: male Admission Date (Current Location): 03/18/2023  [facility] and [place] Number:  Producer, television/film/video and Address:  [facility], [address], [place], [place] [address]      Provider Number: [id number]  Attending Physician Name and Address:  Maretta Bees, MD  Relative Name and Phone Number:       Current Level of Care: Hospital Recommended Level of Care: Skilled Nursing Facility Prior Approval Number:    Date Approved/Denied:   PASRR Number: [id number]  Discharge Plan: SNF    Current Diagnoses: Patient Active Problem List   Diagnosis Date Noted   Acute hyperkalemia 03/18/2023   Influenza A 08/16/2022   History of pulmonary embolism 08/16/2022   Acute metabolic encephalopathy 08/16/2022   MCI (mild cognitive impairment) 08/16/2022   Influenza 08/16/2022   Hypokalemia 08/07/2022   Physical deconditioning 08/07/2022   Bilateral pulmonary embolism (HCC) 07/05/2022   UTI (urinary tract infection) 07/04/2022   Urinary tract infection without hematuria 06/28/2022   Confusion 06/28/2022   Hypercoagulable state due to longstanding persistent atrial fibrillation (HCC) 06/08/2022   Foul smelling urine 04/30/2022   Nausea and vomiting 04/30/2022   SDH (subdural hematoma) (HCC) 04/17/2022   Subdural hematoma (HCC) 04/04/2022   New daily persistent headache 04/02/2022   COVID-19 01/26/2022   Hypomagnesemia 01/25/2022   CVA (cerebral vascular accident) (HCC) 01/24/2022   Cough 01/24/2022   Elevated troponin 01/24/2022   Fever 01/24/2022   Bradycardia 11/21/2021   Vitamin D deficiency 11/21/2021   Other fatigue 11/18/2021   Bilateral leg edema 07/08/2021   Gait disorder 12/16/2020   HTN (hypertension) 12/16/2020   Aortic atherosclerosis (HCC) 12/12/2020   Diverticulitis 10/23/2020   Fall 10/22/2020   Sepsis (HCC)  10/22/2020   Acute lower UTI 10/22/2020   NSVT (nonsustained ventricular tachycardia) (HCC)    Multiple myeloma (HCC)    AKI (acute kidney injury) (HCC) 02/15/2020   General weakness    Diabetic leg ulcer (HCC) 10/06/2019   Chronic kidney disease, stage 3b (HCC) 10/06/2019   Diabetic ulcer of right lower leg (HCC) 02/23/2019   Abnormal LFTs 04/27/2018   Open wound of scrotum 03/01/2018   Acute on chronic diastolic CHF (congestive heart failure) (HCC) 02/25/2018   Postoperative anemia due to acute blood loss 02/25/2018   Occipital stroke (HCC), left 09/23/2017   Diabetes mellitus type II, non insulin dependent (HCC)    Encounter for well adult exam with abnormal findings 07/31/2017   Venous insufficiency (chronic) (peripheral) with chronic lower extremity edema 04/04/2016   Junctional tachycardia 03/18/2015   Exertional dyspnea 07/05/2014   Obesity (BMI 30-39.9) 05/04/2014   Benign essential tremor 05/04/2014   Peripheral neuropathy 12/21/2013   Nonspecific abnormal finding in stool contents 09/06/2012   Heme positive stool 08/09/2012   Difficulty passing stool 08/09/2012   Sebaceous cyst 04/15/2012   Complex renal cyst 01/13/2011   Back pain 01/02/2011   Permanent atrial fibrillation (HCC) - now off amiodarone; asymptomatic. CHA2DS2-VASc Score -5 (on Xarelto) 01/02/2011   KNEE PAIN, LEFT 08/27/2009   COLONIC POLYPS, HX OF 08/08/2008   ERECTILE DYSFUNCTION 09/27/2007   ULNAR NEUROPATHY 09/27/2007   VENTRICULAR HYPERTROPHY, LEFT 09/27/2007   ALLERGIC RHINITIS 09/27/2007   Arthropathy 09/27/2007   GANGLION CYST, WRIST, LEFT 09/27/2007   HEMORRHOIDS, INTERNAL 06/10/2007   DIVERTICULOSIS, COLON 06/10/2007   DISC DISEASE, LUMBAR 06/03/2007   NEPHROLITHIASIS, HX  OF 06/03/2007   Diabetes mellitus with neuropathy (HCC) 02/11/2007   Hyperlipidemia with target LDL less than 100 02/11/2007   ANXIETY 02/11/2007   Nondependent Alcohol Abuse, in Remission 02/11/2007   Hypertensive heart  disease with chronic diastolic congestive heart failure (HCC) 02/11/2007   COPD (chronic obstructive pulmonary disease) (HCC) 02/11/2007   BPH (benign prostatic hyperplasia) 02/11/2007   LOW BACK PAIN 02/11/2007   Disturbance of skin sensation 02/11/2007   PROSTATE CANCER, HX OF 02/11/2007    Orientation RESPIRATION BLADDER Height & Weight     Self, Time, Situation, Place  Normal Incontinent Weight: 231 lb 7.7 oz (105 kg) Height:     BEHAVIORAL SYMPTOMS/MOOD NEUROLOGICAL BOWEL NUTRITION STATUS      Continent Diet (See dc summary)  AMBULATORY STATUS COMMUNICATION OF NEEDS Skin   Extensive Assist Verbally                         Personal Care Assistance Level of Assistance  Bathing, Feeding, Dressing Bathing Assistance: Maximum assistance Feeding assistance: Limited assistance Dressing Assistance: Maximum assistance     Functional Limitations Info             SPECIAL CARE FACTORS FREQUENCY                       Contractures Contractures Info: Not present    Additional Factors Info  Code Status, Allergies Code Status Info: Full Allergies Info: Ciprofloxacin           Current Medications (03/20/2023):  This is the current hospital active medication list Current Facility-Administered Medications  Medication Dose Route Frequency Provider Last Rate Last Admin   acetaminophen (TYLENOL) tablet 650 mg  650 mg Oral Q6H PRN [name], MD   650 mg at 03/18/23 2322   apixaban (ELIQUIS) tablet 5 mg  5 mg Oral BID Rosezetta Schlatter T, MD   5 mg at 03/20/23 0825   carvedilol (COREG) tablet 12.5 mg  12.5 mg Oral BID WC Rosezetta Schlatter T, MD   12.5 mg at 03/20/23 0825   feeding supplement (ENSURE ENLIVE / ENSURE PLUS) liquid 237 mL  237 mL Oral TID BM Rosezetta Schlatter T, MD   237 mL at 03/20/23 0827   furosemide (LASIX) tablet 40 mg  40 mg Oral Daily Maretta Bees, MD   40 mg at 03/20/23 0825   gabapentin (NEURONTIN) capsule 100 mg  100 mg Oral BID Maretta Bees, MD    100 mg at 03/20/23 0825   hydrocortisone (ANUSOL-HC) 2.5 % rectal cream   Rectal BID Maretta Bees, MD   Given at 03/20/23 0827   insulin aspart (novoLOG) injection 0-5 Units  0-5 Units Subcutaneous QHS Rosezetta Schlatter T, MD       insulin aspart (novoLOG) injection 0-9 Units  0-9 Units Subcutaneous TID WC Djan, Scarlette Calico, MD       ipratropium-albuterol (DUONEB) 0.5-2.5 (3) MG/3ML nebulizer solution 3 mL  3 mL Nebulization Q4H PRN [name], MD       multivitamin with minerals tablet 1 tablet  1 tablet Oral Daily Rosezetta Schlatter T, MD   1 tablet at 03/20/23 0825   ondansetron (ZOFRAN) injection 4 mg  4 mg Intravenous Q6H PRN [name], MD       polyethylene glycol (MIRALAX / GLYCOLAX) packet 17 g  17 g Oral Daily PRN [name], MD       rosuvastatin (CRESTOR)  tablet 20 mg  20 mg Oral QPM [name], MD   20 mg at 03/19/23 1803   tamsulosin (FLOMAX) capsule 0.4 mg  0.4 mg Oral QPC supper [name], MD   0.4 mg at 03/19/23 1802     Discharge Medications: Please see discharge summary for a list of discharge medications.  Relevant Imaging Results:  Relevant Lab Results:   Additional Information SS#: [id number]  Mearl Latin, LCSW

## 2023-03-20 NOTE — Progress Notes (Signed)
PT Cancellation Note  Patient Details Name: [name] MRN: [id number] DOB: 09-28-1945   Cancelled Treatment:    Reason Eval/Treat Not Completed: PT screened, no needs identified, will sign off PT orders received, chart reviewed. Pt is long term care resident at [facility] prior to admission, no acute PT needs at this time. PT to complete current orders - MD aware & approved.  Aleda Grana, PT, DPT 03/20/23, 9:23 AM   Sandi Mariscal 03/20/2023, 9:22 AM

## 2023-03-20 NOTE — Discharge Summary (Signed)
PATIENT DETAILS Name: [name] Age: 77 y.o. Sex: male Date of Birth: 18-May-1946 MRN: [id number]. Admitting Physician: Parker Dys, MD [id number], Parker Blalock, MD  Admit Date: 03/18/2023 Discharge date: 03/20/2023  Recommendations for Outpatient Follow-up:  Follow up with PCP in 1-2 weeks Please obtain CMP/CBC in one week Avoid spironolactone in the future-hospitalization for severe hyperkalemia.  Admitted From:  SNF  Disposition: Skilled nursing facility   Discharge Condition: good  CODE STATUS:   Code Status: Full Code   Diet recommendation:  Diet Order             Diet - low sodium heart healthy           Diet Carb Modified           Diet Carb Modified Fluid consistency: Thin; Room service appropriate? Yes  Diet effective now                    Brief Summary: Patient is a 77 y.o.  male with history of chronic HFpEF, A-fib on Eliquis, HTN, chronic bilateral lower extremity lymphedema-who was transferred from SNF for evaluation of hyperkalemia.   Significant events: 8/28>> admit to [facility]   Significant studies: 8/28>> x-ray chest: No PNA   Significant microbiology data: None   Procedures: None   Consults: None    Brief Hospital Course: Hyperkalemia Likely secondary to mild AKI-Aldactone/potassium supplementation Has resolved with supportive care and Lokelma Continue to recheck electrolytes periodically at SNF.   AKI Likely hemodynamically mediated Hydrated with IVF-diuretics held Thankfully creatinine has improved and is close to baseline Continue to follow electrolytes periodically at SNF-suggest recheck in 1 week.   Chronic HFpEF Chronic lower extremity lymphedema Stable volume status Diuretics initially held-will resume furosemide on discharge.  Due to hyperkalemia-suggest avoiding Aldactone in the future. Continue compressive stockings as previous.   Chronic atrial fibrillation Coreg/Eliquis   History of pulmonary  embolism  Eliquis   DM-2 CBGs stable with SSI   HLD Statin   BPH Flomax   COPD Stable Bronchodilators   Peripheral neuropathy Presumably related to DM Neurontin   Debility/deconditioning SNF resident PT/OT eval   Obesity: Estimated body mass index is 30.54 kg/m as calculated from the following:   Height as of 09/15/22: 6\' 1"  (1.854 m).   Weight as of this encounter: 105 kg.   Discharge Diagnoses:  Principal Problem:   Acute hyperkalemia   Discharge Instructions:  Activity:  As tolerated with Full fall precautions use walker/cane & assistance as needed  Discharge Instructions     Call MD for:  difficulty breathing, headache or visual disturbances   Complete by: As directed    Call MD for:  persistant dizziness or light-headedness   Complete by: As directed    Diet - low sodium heart healthy   Complete by: As directed    Diet Carb Modified   Complete by: As directed    Discharge instructions   Complete by: As directed    Follow with Primary MD  Parker Levins, MD in 1-2 weeks  Please get a complete blood count and chemistry panel checked by your Primary MD at your next visit, and again as instructed by your Primary MD.  Get Medicines reviewed and adjusted: Please take all your medications with you for your next visit with your Primary MD  Laboratory/radiological data: Please request your Primary MD to go over all hospital tests and procedure/radiological results at the follow up, please ask your Primary MD to  get all Hospital records sent to his/her office.  In some cases, they will be blood work, cultures and biopsy results pending at the time of your discharge. Please request that your primary care M.D. follows up on these results.  Also Note the following: If you experience worsening of your admission symptoms, develop shortness of breath, life threatening emergency, suicidal or homicidal thoughts you must seek medical attention immediately by calling  911 or calling your MD immediately  if symptoms less severe.  You must read complete instructions/literature along with all the possible adverse reactions/side effects for all the Medicines you take and that have been prescribed to you. Take any new Medicines after you have completely understood and accpet all the possible adverse reactions/side effects.   Do not drive when taking Pain medications or sleeping medications (Benzodaizepines)  Do not take more than prescribed Pain, Sleep and Anxiety Medications. It is not advisable to combine anxiety,sleep and pain medications without talking with your primary care practitioner  Special Instructions: If you have smoked or chewed Tobacco  in the last 2 yrs please stop smoking, stop any regular Alcohol  and or any Recreational drug use.  Wear Seat belts while driving.  Please note: You were cared for by a hospitalist during your hospital stay. Once you are discharged, your primary care physician will handle any further medical issues. Please note that NO REFILLS for any discharge medications will be authorized once you are discharged, as it is imperative that you return to your primary care physician (or establish a relationship with a primary care physician if you do not have one) for your post hospital discharge needs so that they can reassess your need for medications and monitor your lab values.   Discharge wound care:   Complete by: As directed    Clean R plantar foot wound with NS, apply Betadine to area daily and allow to dry. May cover with silicone foam if desired.  Cover R anterior lower leg with single layer Xeroform gauze, ABD pad and wrap R leg beginning just above toes and ending right below knees with Kerlix roll gauze. May apply Ace bandage wrapped in same fashion as Kerlix for some light compression   Increase activity slowly   Complete by: As directed       Allergies as of 03/20/2023       Reactions   Ciprofloxacin Other (See  Comments)   All over weakness        Medication List     STOP taking these medications    cephALEXin 250 MG capsule Commonly known as: KEFLEX   potassium chloride SA 20 MEQ tablet Commonly known as: KLOR-CON M   spironolactone 50 MG tablet Commonly known as: ALDACTONE       TAKE these medications    Accu-Chek Guide Me w/Device Kit Use as directed twice per day E11.9   Accu-Chek Guide test strip Generic drug: glucose blood Use as instructed twice per day E11.9   acetaminophen 500 MG tablet Commonly known as: TYLENOL Take 500 mg by mouth in the morning and at bedtime.   apixaban 5 MG Tabs tablet Commonly known as: ELIQUIS Take 1 tablet (5 mg total) by mouth 2 (two) times daily.   carvedilol 12.5 MG tablet Commonly known as: COREG Take 1 tablet (12.5 mg total) by mouth 2 (two) times daily with a meal.   feeding supplement Liqd Take 237 mLs by mouth 3 (three) times daily between meals.   fluticasone 50 MCG/ACT  nasal spray Commonly known as: FLONASE Place 1 spray into both nostrils daily.   furosemide 40 MG tablet Commonly known as: LASIX TAKE 1 TABLET BY MOUTH DAILY .  MAY TAKE AN ADDITIONAL 1 TABLET  BY MOUTH IF NEEDED FOR SWELLING What changed:  how much to take how to take this when to take this additional instructions   gabapentin 100 MG capsule Commonly known as: NEURONTIN Take 100 mg by mouth 2 (two) times daily.   guaiFENesin 100 MG/5ML liquid Commonly known as: ROBITUSSIN Take 5 mLs by mouth every 4 (four) hours as needed for cough or to loosen phlegm.   hydrocortisone 2.5 % rectal cream Commonly known as: ANUSOL-HC Place rectally 2 (two) times daily for 2 days.   magnesium oxide 400 (240 Mg) MG tablet Commonly known as: MAG-OX Take 1 tablet (400 mg total) by mouth 2 (two) times daily. What changed: when to take this   multivitamin with minerals Tabs tablet Take 1 tablet by mouth daily.   NovoLOG FlexPen 100 UNIT/ML FlexPen Generic  drug: insulin aspart 0-9 Units, Subcutaneous, 3 times daily with meals CBG < 70: Implement Hypoglycemia measures CBG 70 - 120: 0 units CBG 121 - 150: 1 unit CBG 151 - 200: 2 units CBG 201 - 250: 3 units CBG 251 - 300: 5 units CBG 301 - 350: 7 units CBG 351 - 400: 9 units CBG > 400: call MD   OneTouch Delica Plus Lancet33G Misc USE ONCE DAILY   OneTouch Delica Plus Lancing Misc   polyethylene glycol 17 g packet Commonly known as: MIRALAX / GLYCOLAX Take 17 g by mouth daily as needed for mild constipation.   Preparation H 0.25-14-74.9 % rectal ointment Generic drug: phenylephrine-shark liver oil-mineral oil-petrolatum Place 1 Application rectally daily as needed for hemorrhoids.   ProAir HFA 108 (90 Base) MCG/ACT inhaler Generic drug: albuterol INHALE 2 PUFFS INTO THE  LUNGS EVERY 6 HOURS AS  NEEDED What changed: reasons to take this   rosuvastatin 20 MG tablet Commonly known as: CRESTOR Take 20 mg by mouth every evening.   tamsulosin 0.4 MG Caps capsule Commonly known as: FLOMAX Take 1 capsule (0.4 mg total) by mouth daily after supper.   Vitamin D (Ergocalciferol) 1.25 MG (50000 UNIT) Caps capsule Commonly known as: DRISDOL Take 50,000 Units by mouth once a week.               Discharge Care Instructions  (From admission, onward)           Start     Ordered   03/20/23 0000  Discharge wound care:       Comments: Clean R plantar foot wound with NS, apply Betadine to area daily and allow to dry. May cover with silicone foam if desired.  Cover R anterior lower leg with single layer Xeroform gauze, ABD pad and wrap R leg beginning just above toes and ending right below knees with Kerlix roll gauze. May apply Ace bandage wrapped in same fashion as Kerlix for some light compression   03/20/23 0856            Follow-up Information     Parker Levins, MD. Schedule an appointment as soon as possible for a visit in 1 week(s).   Specialties: Internal Medicine,  Radiology Contact information: [address] [place] [address] [phone]                Allergies  Allergen Reactions   Ciprofloxacin Other (See Comments)  All over weakness     Other Procedures/Studies: DG Chest 1 View  Result Date: 03/18/2023 CLINICAL DATA:  Bilateral lower extremity edema. EXAM: CHEST  1 VIEW COMPARISON:  Two-view chest x-ray 01/15/2023 FINDINGS: Heart is enlarged. No edema or effusion is present. Lung volumes are low. The visualized soft tissues and bony thorax are unremarkable. IMPRESSION: 1. Cardiomegaly without failure. 2. Low lung volumes. Electronically Signed   By: Marin Roberts M.D.   On: 03/18/2023 18:34     TODAY-DAY OF DISCHARGE:  Subjective:   [name] today has no headache,no chest abdominal pain,no new weakness tingling or numbness, feels much better wants to go home today.   Objective:   Blood pressure (!) 140/70, pulse 60, temperature 98.2 F (36.8 C), temperature source Oral, resp. rate 16, weight 105 kg, SpO2 (!) 87%.  Intake/Output Summary (Last 24 hours) at 03/20/2023 0857 Last data filed at 03/19/2023 2212 Gross per 24 hour  Intake --  Output 1200 ml  Net -1200 ml   Filed Weights   03/19/23 0449  Weight: 105 kg    Exam: Awake Alert, Oriented *3, No new F.N deficits, Normal affect [place].AT,PERRAL Supple Neck,No JVD, No cervical lymphadenopathy appriciated.  Symmetrical Chest wall movement, Good air movement bilaterally, CTAB RRR,No Gallops,Rubs or new Murmurs, No Parasternal Heave +ve B.Sounds, Abd Soft, Non tender, No organomegaly appriciated, No rebound -guarding or rigidity. No Cyanosis, Clubbing or edema, No new Rash or bruise   PERTINENT RADIOLOGIC STUDIES: DG Chest 1 View  Result Date: 03/18/2023 CLINICAL DATA:  Bilateral lower extremity edema. EXAM: CHEST  1 VIEW COMPARISON:  Two-view chest x-ray 01/15/2023 FINDINGS: Heart is enlarged. No edema or effusion is present. Lung volumes  are low. The visualized soft tissues and bony thorax are unremarkable. IMPRESSION: 1. Cardiomegaly without failure. 2. Low lung volumes. Electronically Signed   By: Marin Roberts M.D.   On: 03/18/2023 18:34     PERTINENT LAB RESULTS: CBC: Recent Labs    03/18/23 1549  WBC 11.0*  HGB 13.7  HCT 45.3  PLT 232   CMET CMP     Component Value Date/Time   NA 133 (L) 03/20/2023 0343   NA 138 08/23/2020 1504   K 5.1 03/20/2023 0343   CL 105 03/20/2023 0343   CO2 19 (L) 03/20/2023 0343   GLUCOSE 96 03/20/2023 0343   BUN 20 03/20/2023 0343   BUN 22 08/23/2020 1504   CREATININE 1.44 (H) 03/20/2023 0343   CREATININE 1.64 (H) 03/29/2020 0919   CREATININE 1.17 09/16/2016 1312   CALCIUM 8.8 (L) 03/20/2023 0343   PROT 8.9 (H) 03/18/2023 1549   PROT 8.4 08/23/2020 1504   ALBUMIN 2.3 (L) 03/18/2023 1549   ALBUMIN 3.5 (L) 08/23/2020 1504   AST 23 03/18/2023 1549   AST 16 03/29/2020 0919   ALT 13 03/18/2023 1549   ALT 8 03/29/2020 0919   ALKPHOS 137 (H) 03/18/2023 1549   BILITOT 0.4 03/18/2023 1549   BILITOT 0.5 08/23/2020 1504   BILITOT 0.4 03/29/2020 0919   GFR 52.71 (L) 08/07/2022 1104   GFRNONAA 50 (L) 03/20/2023 0343   GFRNONAA 41 (L) 03/29/2020 0919    GFR CrCl cannot be calculated (Unknown ideal weight.). No results for input(s): "LIPASE", "AMYLASE" in the last 72 hours. No results for input(s): "CKTOTAL", "CKMB", "CKMBINDEX", "TROPONINI" in the last 72 hours. Invalid input(s): "POCBNP" No results for input(s): "DDIMER" in the last 72 hours. No results for input(s): "HGBA1C" in the last 72 hours. No results for input(s): "  CHOL", "HDL", "LDLCALC", "TRIG", "CHOLHDL", "LDLDIRECT" in the last 72 hours. No results for input(s): "TSH", "T4TOTAL", "T3FREE", "THYROIDAB" in the last 72 hours.  Invalid input(s): "FREET3" No results for input(s): "VITAMINB12", "FOLATE", "FERRITIN", "TIBC", "IRON", "RETICCTPCT" in the last 72 hours. Coags: No results for input(s): "INR" in the  last 72 hours.  Invalid input(s): "PT" Microbiology: No results found for this or any previous visit (from the past 240 hour(s)).  FURTHER DISCHARGE INSTRUCTIONS:  Get Medicines reviewed and adjusted: Please take all your medications with you for your next visit with your Primary MD  Laboratory/radiological data: Please request your Primary MD to go over all hospital tests and procedure/radiological results at the follow up, please ask your Primary MD to get all Hospital records sent to his/her office.  In some cases, they will be blood work, cultures and biopsy results pending at the time of your discharge. Please request that your primary care M.D. goes through all the records of your hospital data and follows up on these results.  Also Note the following: If you experience worsening of your admission symptoms, develop shortness of breath, life threatening emergency, suicidal or homicidal thoughts you must seek medical attention immediately by calling 911 or calling your MD immediately  if symptoms less severe.  You must read complete instructions/literature along with all the possible adverse reactions/side effects for all the Medicines you take and that have been prescribed to you. Take any new Medicines after you have completely understood and accpet all the possible adverse reactions/side effects.   Do not drive when taking Pain medications or sleeping medications (Benzodaizepines)  Do not take more than prescribed Pain, Sleep and Anxiety Medications. It is not advisable to combine anxiety,sleep and pain medications without talking with your primary care practitioner  Special Instructions: If you have smoked or chewed Tobacco  in the last 2 yrs please stop smoking, stop any regular Alcohol  and or any Recreational drug use.  Wear Seat belts while driving.  Please note: You were cared for by a hospitalist during your hospital stay. Once you are discharged, your primary care  physician will handle any further medical issues. Please note that NO REFILLS for any discharge medications will be authorized once you are discharged, as it is imperative that you return to your primary care physician (or establish a relationship with a primary care physician if you do not have one) for your post hospital discharge needs so that they can reassess your need for medications and monitor your lab values.  Total Time spent coordinating discharge including counseling, education and face to face time equals greater than 30 minutes.  SignedJeoffrey Massed 03/20/2023 8:57 AM

## 2023-03-20 NOTE — Progress Notes (Signed)
Tried calling report, spoke to [place] who transferred the call but no one answered

## 2023-03-24 DIAGNOSIS — R410 Disorientation, unspecified: Secondary | ICD-10-CM | POA: Diagnosis not present

## 2023-03-24 DIAGNOSIS — E875 Hyperkalemia: Secondary | ICD-10-CM | POA: Diagnosis not present

## 2023-03-26 DIAGNOSIS — I890 Lymphedema, not elsewhere classified: Secondary | ICD-10-CM | POA: Diagnosis not present

## 2023-03-26 DIAGNOSIS — I5032 Chronic diastolic (congestive) heart failure: Secondary | ICD-10-CM | POA: Diagnosis not present

## 2023-03-26 DIAGNOSIS — E785 Hyperlipidemia, unspecified: Secondary | ICD-10-CM | POA: Diagnosis not present

## 2023-03-26 DIAGNOSIS — Z7901 Long term (current) use of anticoagulants: Secondary | ICD-10-CM | POA: Diagnosis not present

## 2023-03-26 DIAGNOSIS — J449 Chronic obstructive pulmonary disease, unspecified: Secondary | ICD-10-CM | POA: Diagnosis not present

## 2023-03-26 DIAGNOSIS — N179 Acute kidney failure, unspecified: Secondary | ICD-10-CM | POA: Diagnosis not present

## 2023-03-26 DIAGNOSIS — L97519 Non-pressure chronic ulcer of other part of right foot with unspecified severity: Secondary | ICD-10-CM | POA: Diagnosis not present

## 2023-03-26 DIAGNOSIS — E1122 Type 2 diabetes mellitus with diabetic chronic kidney disease: Secondary | ICD-10-CM | POA: Diagnosis not present

## 2023-03-26 DIAGNOSIS — E11621 Type 2 diabetes mellitus with foot ulcer: Secondary | ICD-10-CM | POA: Diagnosis not present

## 2023-03-26 DIAGNOSIS — Z86711 Personal history of pulmonary embolism: Secondary | ICD-10-CM | POA: Diagnosis not present

## 2023-03-26 DIAGNOSIS — E1151 Type 2 diabetes mellitus with diabetic peripheral angiopathy without gangrene: Secondary | ICD-10-CM | POA: Diagnosis not present

## 2023-03-26 DIAGNOSIS — E1142 Type 2 diabetes mellitus with diabetic polyneuropathy: Secondary | ICD-10-CM | POA: Diagnosis not present

## 2023-03-26 DIAGNOSIS — I482 Chronic atrial fibrillation, unspecified: Secondary | ICD-10-CM | POA: Diagnosis not present

## 2023-03-26 DIAGNOSIS — Z794 Long term (current) use of insulin: Secondary | ICD-10-CM | POA: Diagnosis not present

## 2023-03-26 DIAGNOSIS — E875 Hyperkalemia: Secondary | ICD-10-CM | POA: Diagnosis not present

## 2023-03-26 DIAGNOSIS — N183 Chronic kidney disease, stage 3 unspecified: Secondary | ICD-10-CM | POA: Diagnosis not present

## 2023-03-27 DIAGNOSIS — M6250 Muscle wasting and atrophy, not elsewhere classified, unspecified site: Secondary | ICD-10-CM | POA: Diagnosis not present

## 2023-03-27 DIAGNOSIS — R279 Unspecified lack of coordination: Secondary | ICD-10-CM | POA: Diagnosis not present

## 2023-03-27 DIAGNOSIS — M6281 Muscle weakness (generalized): Secondary | ICD-10-CM | POA: Diagnosis not present

## 2023-03-27 DIAGNOSIS — R2689 Other abnormalities of gait and mobility: Secondary | ICD-10-CM | POA: Diagnosis not present

## 2023-03-27 DIAGNOSIS — N390 Urinary tract infection, site not specified: Secondary | ICD-10-CM | POA: Diagnosis not present

## 2023-03-28 DIAGNOSIS — E875 Hyperkalemia: Secondary | ICD-10-CM | POA: Diagnosis not present

## 2023-03-30 DIAGNOSIS — R279 Unspecified lack of coordination: Secondary | ICD-10-CM | POA: Diagnosis not present

## 2023-03-30 DIAGNOSIS — M6281 Muscle weakness (generalized): Secondary | ICD-10-CM | POA: Diagnosis not present

## 2023-03-30 DIAGNOSIS — M6250 Muscle wasting and atrophy, not elsewhere classified, unspecified site: Secondary | ICD-10-CM | POA: Diagnosis not present

## 2023-03-30 DIAGNOSIS — R2689 Other abnormalities of gait and mobility: Secondary | ICD-10-CM | POA: Diagnosis not present

## 2023-03-31 DIAGNOSIS — M79604 Pain in right leg: Secondary | ICD-10-CM | POA: Diagnosis not present

## 2023-04-01 DIAGNOSIS — M6250 Muscle wasting and atrophy, not elsewhere classified, unspecified site: Secondary | ICD-10-CM | POA: Diagnosis not present

## 2023-04-01 DIAGNOSIS — R2689 Other abnormalities of gait and mobility: Secondary | ICD-10-CM | POA: Diagnosis not present

## 2023-04-01 DIAGNOSIS — M6281 Muscle weakness (generalized): Secondary | ICD-10-CM | POA: Diagnosis not present

## 2023-04-01 DIAGNOSIS — R279 Unspecified lack of coordination: Secondary | ICD-10-CM | POA: Diagnosis not present

## 2023-04-06 DIAGNOSIS — Z23 Encounter for immunization: Secondary | ICD-10-CM | POA: Diagnosis not present

## 2023-04-07 DIAGNOSIS — R279 Unspecified lack of coordination: Secondary | ICD-10-CM | POA: Diagnosis not present

## 2023-04-07 DIAGNOSIS — M6250 Muscle wasting and atrophy, not elsewhere classified, unspecified site: Secondary | ICD-10-CM | POA: Diagnosis not present

## 2023-04-07 DIAGNOSIS — M6281 Muscle weakness (generalized): Secondary | ICD-10-CM | POA: Diagnosis not present

## 2023-04-07 DIAGNOSIS — R2689 Other abnormalities of gait and mobility: Secondary | ICD-10-CM | POA: Diagnosis not present

## 2023-04-08 DIAGNOSIS — R2689 Other abnormalities of gait and mobility: Secondary | ICD-10-CM | POA: Diagnosis not present

## 2023-04-08 DIAGNOSIS — R279 Unspecified lack of coordination: Secondary | ICD-10-CM | POA: Diagnosis not present

## 2023-04-08 DIAGNOSIS — M6281 Muscle weakness (generalized): Secondary | ICD-10-CM | POA: Diagnosis not present

## 2023-04-08 DIAGNOSIS — M6250 Muscle wasting and atrophy, not elsewhere classified, unspecified site: Secondary | ICD-10-CM | POA: Diagnosis not present

## 2023-04-09 DIAGNOSIS — M6281 Muscle weakness (generalized): Secondary | ICD-10-CM | POA: Diagnosis not present

## 2023-04-09 DIAGNOSIS — R279 Unspecified lack of coordination: Secondary | ICD-10-CM | POA: Diagnosis not present

## 2023-04-09 DIAGNOSIS — M6250 Muscle wasting and atrophy, not elsewhere classified, unspecified site: Secondary | ICD-10-CM | POA: Diagnosis not present

## 2023-04-09 DIAGNOSIS — R2689 Other abnormalities of gait and mobility: Secondary | ICD-10-CM | POA: Diagnosis not present

## 2023-04-10 DIAGNOSIS — M6281 Muscle weakness (generalized): Secondary | ICD-10-CM | POA: Diagnosis not present

## 2023-04-10 DIAGNOSIS — R279 Unspecified lack of coordination: Secondary | ICD-10-CM | POA: Diagnosis not present

## 2023-04-10 DIAGNOSIS — N390 Urinary tract infection, site not specified: Secondary | ICD-10-CM | POA: Diagnosis not present

## 2023-04-10 DIAGNOSIS — R2689 Other abnormalities of gait and mobility: Secondary | ICD-10-CM | POA: Diagnosis not present

## 2023-04-10 DIAGNOSIS — M6250 Muscle wasting and atrophy, not elsewhere classified, unspecified site: Secondary | ICD-10-CM | POA: Diagnosis not present

## 2023-04-13 DIAGNOSIS — M6250 Muscle wasting and atrophy, not elsewhere classified, unspecified site: Secondary | ICD-10-CM | POA: Diagnosis not present

## 2023-04-13 DIAGNOSIS — R279 Unspecified lack of coordination: Secondary | ICD-10-CM | POA: Diagnosis not present

## 2023-04-13 DIAGNOSIS — M6281 Muscle weakness (generalized): Secondary | ICD-10-CM | POA: Diagnosis not present

## 2023-04-13 DIAGNOSIS — R2689 Other abnormalities of gait and mobility: Secondary | ICD-10-CM | POA: Diagnosis not present

## 2023-04-14 DIAGNOSIS — M6250 Muscle wasting and atrophy, not elsewhere classified, unspecified site: Secondary | ICD-10-CM | POA: Diagnosis not present

## 2023-04-14 DIAGNOSIS — R279 Unspecified lack of coordination: Secondary | ICD-10-CM | POA: Diagnosis not present

## 2023-04-14 DIAGNOSIS — M6281 Muscle weakness (generalized): Secondary | ICD-10-CM | POA: Diagnosis not present

## 2023-04-14 DIAGNOSIS — R2689 Other abnormalities of gait and mobility: Secondary | ICD-10-CM | POA: Diagnosis not present

## 2023-04-15 DIAGNOSIS — R279 Unspecified lack of coordination: Secondary | ICD-10-CM | POA: Diagnosis not present

## 2023-04-15 DIAGNOSIS — M6281 Muscle weakness (generalized): Secondary | ICD-10-CM | POA: Diagnosis not present

## 2023-04-15 DIAGNOSIS — M6250 Muscle wasting and atrophy, not elsewhere classified, unspecified site: Secondary | ICD-10-CM | POA: Diagnosis not present

## 2023-04-15 DIAGNOSIS — R2689 Other abnormalities of gait and mobility: Secondary | ICD-10-CM | POA: Diagnosis not present

## 2023-04-16 DIAGNOSIS — M6281 Muscle weakness (generalized): Secondary | ICD-10-CM | POA: Diagnosis not present

## 2023-04-16 DIAGNOSIS — M6250 Muscle wasting and atrophy, not elsewhere classified, unspecified site: Secondary | ICD-10-CM | POA: Diagnosis not present

## 2023-04-16 DIAGNOSIS — R279 Unspecified lack of coordination: Secondary | ICD-10-CM | POA: Diagnosis not present

## 2023-04-16 DIAGNOSIS — R2689 Other abnormalities of gait and mobility: Secondary | ICD-10-CM | POA: Diagnosis not present

## 2023-04-17 DIAGNOSIS — R2689 Other abnormalities of gait and mobility: Secondary | ICD-10-CM | POA: Diagnosis not present

## 2023-04-17 DIAGNOSIS — M6250 Muscle wasting and atrophy, not elsewhere classified, unspecified site: Secondary | ICD-10-CM | POA: Diagnosis not present

## 2023-04-17 DIAGNOSIS — M6281 Muscle weakness (generalized): Secondary | ICD-10-CM | POA: Diagnosis not present

## 2023-04-17 DIAGNOSIS — R279 Unspecified lack of coordination: Secondary | ICD-10-CM | POA: Diagnosis not present

## 2023-04-20 DIAGNOSIS — R2689 Other abnormalities of gait and mobility: Secondary | ICD-10-CM | POA: Diagnosis not present

## 2023-04-20 DIAGNOSIS — R279 Unspecified lack of coordination: Secondary | ICD-10-CM | POA: Diagnosis not present

## 2023-04-20 DIAGNOSIS — M6250 Muscle wasting and atrophy, not elsewhere classified, unspecified site: Secondary | ICD-10-CM | POA: Diagnosis not present

## 2023-04-20 DIAGNOSIS — M6281 Muscle weakness (generalized): Secondary | ICD-10-CM | POA: Diagnosis not present

## 2023-04-22 ENCOUNTER — Ambulatory Visit: Payer: Medicare Other | Admitting: Cardiology

## 2023-04-28 ENCOUNTER — Encounter: Payer: Self-pay | Admitting: Pharmacist

## 2023-04-28 NOTE — Progress Notes (Signed)
Pharmacy Quality Measure Review  This patient is appearing on a report for being at risk of failing the adherence measure for cholesterol (statin) medications this calendar year.   Medication: rosuvastatin 20 mg Last fill date: 04/24/2023 for 30 day supply  Insurance report was not up to date. No action needed at this time.   Arbutus Leas, PharmD, BCPS [facility] [phone]

## 2023-06-25 ENCOUNTER — Encounter: Payer: Self-pay | Admitting: Neurology

## 2023-06-25 ENCOUNTER — Ambulatory Visit (INDEPENDENT_AMBULATORY_CARE_PROVIDER_SITE_OTHER): Payer: Medicare Other | Admitting: Neurology

## 2023-06-25 VITALS — BP 155/76 | HR 42 | Resp 16 | Ht 74.0 in

## 2023-06-25 DIAGNOSIS — G301 Alzheimer's disease with late onset: Secondary | ICD-10-CM | POA: Diagnosis not present

## 2023-06-25 DIAGNOSIS — F02A18 Dementia in other diseases classified elsewhere, mild, with other behavioral disturbance: Secondary | ICD-10-CM | POA: Diagnosis not present

## 2023-06-25 DIAGNOSIS — F01A18 Vascular dementia, mild, with other behavioral disturbance: Secondary | ICD-10-CM

## 2023-06-25 NOTE — Patient Instructions (Signed)
Will check Dementia labs including ATN, B12 and TSH  Will daughter with results, will probably start Aricept  Follow up with Urology for treatment of recurrent UTIs  Return in a year or sooner if worse   There are well-accepted and sensible ways to reduce risk for Alzheimers disease and other degenerative brain disorders .  Exercise Daily Walk A daily 20 minute walk should be part of your routine. Disease related apathy can be a significant roadblock to exercise and the only way to overcome this is to make it a daily routine and perhaps have a reward at the end (something your loved one loves to eat or drink perhaps) or a personal trainer coming to the home can also be very useful. Most importantly, the patient is much more likely to exercise if the caregiver / spouse does it with him/her. In general a structured, repetitive schedule is best.  General Health: Any diseases which effect your body will effect your brain such as a pneumonia, urinary infection, blood clot, heart attack or stroke. Keep contact with your primary care doctor for regular follow ups.  Sleep. A good nights sleep is healthy for the brain. Seven hours is recommended. If you have insomnia or poor sleep habits we can give you some instructions. If you have sleep apnea wear your mask.  Diet: Eating a heart healthy diet is also a good idea; fish and poultry instead of red meat, nuts (mostly non-peanuts), vegetables, fruits, olive oil or canola oil (instead of butter), minimal salt (use other spices to flavor foods), whole grain rice, bread, cereal and pasta and wine in moderation.Research is now showing that the MIND diet, which is a combination of The Mediterranean diet and the DASH diet, is beneficial for cognitive processing and longevity. Information about this diet can be found in The MIND Diet, a book by Alonna Minium, MS, RDN, and online at [employer]  Finances, Power of [address] and Advance  Directives: You should consider putting legal safeguards in place with regard to financial and medical decision making. While the spouse always has power of attorney for medical and financial issues in the absence of any form, you should consider what you want in case the spouse / caregiver is no longer around or capable of making decisions.

## 2023-06-25 NOTE — Progress Notes (Signed)
[facility]  PATIENT: [name] DOB: [birth year]  REQUESTING CLINICIAN: Uvaldo Bristle, NP HISTORY FROM: Patient and daughter  REASON FOR VISIT: Memory loss, Agitation    HISTORICAL  CHIEF COMPLAINT:  Chief Complaint  Patient presents with   New Patient (Initial Visit)    Rm12, daughter present,  Increased confusion, memory changes: mmse was 17    HISTORY OF PRESENT ILLNESS:  This is 77 year old gentleman past medical history of atrial fibrillation, hypertension, hyperlipidemia, TBI's, lymphedema and recurrent UTIs who is presenting with daughter for memory deficit.  Currently patient does live at [facility] since January 2024.  Daughter tells me prior to that, patient was living at home but was having difficulty taking care of himself and was having multiple falls.  He did have a fall in October 2023 where he did have subdural hematoma. January 2024 he did have another fall and after that has been living at [facility] since then.  Prior to [facility], daughter reports that patient was having difficulty with activities of daily living, difficulty with taking his meds, paying his bills to the point that her other daughter took over the bills.   He was also having issues with self-care that family has to remind him.  He was not cooking as daughter was helping him with his food, and he did have limited mobility due to his lymphedema and was having recurrent falls.   Since being at [facility], they noted increased irritability and increased confusion, during this time also patient has been having multiple UTIs.  Daughter tells me that he is confused about the date, the time, does not remember recent conversation and also there are hallucinations described as seeing little children in his room.   TBI: Yes, with subdural hematoma  Stroke: Yes, seen on head CT  Seizures:  no past history of seizures Sleep:  no history of sleep apnea.  Mood: patient denies anxiety and  depression Family history of Dementia: Maternal aunt   Functional status: Dependent in some ADLs and IADLs Patient lives with [facility] but was living independently . Cooking: no Cleaning: no Shopping: no Bathing: yes Toileting: yes with help Driving: Last drove [id number] Bills: Yes, daughter took over Medications: Needs helps Ever left the stove on by accident?: N/A Forget how to use items around the house?: Denies Getting lost going to familiar places?: N/A Forgetting loved ones names?: no Word finding difficulty? yes Sleep: no issues    OTHER MEDICAL CONDITIONS: TBI, Lymphedema, Recurrent UTIs, Hypertension, Hyperlipidemia, Atrial fibrillation    REVIEW OF SYSTEMS: Full 14 system review of systems performed and negative with exception of: As noted in the HPI  ALLERGIES: Allergies  Allergen Reactions   Ciprofloxacin Other (See Comments)    All over weakness    HOME MEDICATIONS: Outpatient Medications Prior to Visit  Medication Sig Dispense Refill   acetaminophen (TYLENOL) 500 MG tablet Take 500 mg by mouth in the morning and at bedtime.     apixaban (ELIQUIS) 5 MG TABS tablet Take 1 tablet (5 mg total) by mouth 2 (two) times daily. 60 tablet 3   Blood Glucose Monitoring Suppl (ACCU-CHEK GUIDE ME) w/Device KIT Use as directed twice per day E11.9 1 kit 0   carvedilol (COREG) 12.5 MG tablet Take 1 tablet (12.5 mg total) by mouth 2 (two) times daily with a meal.     fluticasone (FLONASE) 50 MCG/ACT nasal spray Place 1 spray into both nostrils daily.     furosemide (LASIX) 40 MG  tablet TAKE 1 TABLET BY MOUTH DAILY .  MAY TAKE AN ADDITIONAL 1 TABLET  BY MOUTH IF NEEDED FOR SWELLING (Patient taking differently: Take 40 mg by mouth daily.) 200 tablet 1   gabapentin (NEURONTIN) 100 MG capsule Take 100 mg by mouth 2 (two) times daily.     glucose blood (ACCU-CHEK GUIDE) test strip Use as instructed twice per day E11.9 200 each 12   guaiFENesin (ROBITUSSIN) 100 MG/5ML liquid Take 5 mLs  by mouth every 4 (four) hours as needed for cough or to loosen phlegm. 120 mL 0   insulin aspart (NOVOLOG FLEXPEN) 100 UNIT/ML FlexPen 0-9 Units, Subcutaneous, 3 times daily with meals CBG < 70: Implement Hypoglycemia measures CBG 70 - 120: 0 units CBG 121 - 150: 1 unit CBG 151 - 200: 2 units CBG 201 - 250: 3 units CBG 251 - 300: 5 units CBG 301 - 350: 7 units CBG 351 - 400: 9 units CBG > 400: call MD     Lancet Devices [facility] DELICA PLUS LANCING) MISC      magnesium oxide (MAG-OX) 400 (240 Mg) MG tablet Take 1 tablet (400 mg total) by mouth 2 (two) times daily. (Patient taking differently: Take 400 mg by mouth daily.)     Multiple Vitamin (MULTIVITAMIN WITH MINERALS) TABS tablet Take 1 tablet by mouth daily.     phenylephrine-shark liver oil-mineral oil-petrolatum (PREPARATION H) 0.25-14-74.9 % rectal ointment Place 1 Application rectally daily as needed for hemorrhoids.     polyethylene glycol (MIRALAX / GLYCOLAX) 17 g packet Take 17 g by mouth daily as needed for mild constipation. 14 each 0   PROAIR HFA 108 (90 Base) MCG/ACT inhaler INHALE 2 PUFFS INTO THE  LUNGS EVERY 6 HOURS AS  NEEDED (Patient taking differently: Inhale 2 puffs into the lungs every 6 (six) hours as needed for shortness of breath or wheezing.) 34 g 3   rosuvastatin (CRESTOR) 20 MG tablet Take 20 mg by mouth every evening.     tamsulosin (FLOMAX) 0.4 MG CAPS capsule Take 1 capsule (0.4 mg total) by mouth daily after supper. 30 capsule 1   Vitamin D, Ergocalciferol, (DRISDOL) 1.25 MG (50000 UNIT) CAPS capsule Take 50,000 Units by mouth once a week.     feeding supplement (ENSURE ENLIVE / ENSURE PLUS) LIQD Take 237 mLs by mouth 3 (three) times daily between meals. (Patient not taking: Reported on 06/25/2023) 237 mL 12   Lancets (ONETOUCH DELICA PLUS LANCET33G) MISC USE ONCE DAILY 100 each 3   No facility-administered medications prior to visit.    PAST MEDICAL HISTORY: Past Medical History:  Diagnosis Date   Adenoma 05/2008    Alcoholism in recovery [facility])    BPH (benign prostatic hyperplasia)    Chronic LBP    Hip & Back -- [facility]. Retia [facility]   CKD (chronic kidney disease)    with AKI requiring brief CRRT 02/2020   Colon polyps    Controlled type 2 diabetes mellitus with neuropathy (HCC) 01/2007   COPD (chronic obstructive pulmonary disease) (HCC)    Diverticulitis of colon    ED (erectile dysfunction)    s/p Penile prosthesis (09/2010)   History of prostate cancer    Dr. Vernie Ammons   History of sick sinus syndrome    reduced BB dose for Bradycardia   HLD (hyperlipidemia)    Hypertension    Impaired glucose tolerance 12/21/2013   Nephrolithiasis    Osteoarthritis of both hips    PAF (  paroxysmal atrial fibrillation) (HCC)    No longer on Amiodarone.  Not on Anticoaguation b/c no recurrence.   Peripheral neuropathy 12/21/2013   Stroke (HCC)    left occipital CVA ~ 2018; possible TIA 01/24/22   Subdural hematoma (HCC) 04/04/2022   Anticoagulation stopped   Venous insufficiency     PAST SURGICAL HISTORY: Past Surgical History:  Procedure Laterality Date   IR ANGIO INTRA EXTRACRAN SEL COM CAROTID INNOMINATE UNI R MOD SED  04/17/2022   LEFT HEART CATH AND CORONARY ANGIOGRAPHY  2003   Normal Coronary Arteries.   NM MYOVIEW LTD  02/21/2020   EF 60%.  Medium sized mild severity defect in the basal inferior, mid inferior and apical inferior location-suggesting of ischemia.  Read as low-intermediate risk.  (On cardiology review this is felt to be a fixed defect either related to prior infarct versus diaphragmatic attenuation.  Felt to be low risk)   PENILE PROSTHESIS IMPLANT  06/21/2012   Procedure: PENILE PROTHESIS INFLATABLE;  Surgeon: Garnett Farm, MD;  Location: [facility];  Service: Urology;  Laterality: N/A;  REMOVAL AND REPLACEMENT OF SOME  OF PROSTHESIS (AMS)    PENILE PROSTHESIS PLACEMENT  09/2010   PROSTATECTOMY     RADIOLOGY WITH ANESTHESIA N/A 04/17/2022    Procedure: Right middle meningeal artery embolization;  Surgeon: Lisbeth Renshaw, MD;  Location: [facility] OR;  Service: Radiology;  Laterality: N/A;   REMOVAL OF PENILE PROSTHESIS N/A 02/15/2018   Procedure: REMOVAL OF PENILE PROSTHESIS;  Surgeon: Ihor Gully, MD;  Location: [facility] ORS;  Service: Urology;  Laterality: N/A;   TRANSTHORACIC ECHOCARDIOGRAM  01/2022   EF 70-75%:Marland Kitchen  Hyperdynamic LV with no RWMA.  Mild LVH.  Severely dilated left atrium suggesting notable diastolic dysfunction, but unable to interpret.  Mildly enlarged RV with normal function.  Normal PAP despite moderate dilated RA and increased RAP/CVP.Marland Kitchen  No MS or MR.  No AAS or AI.  Aortic root measuring 42 mm with ascending aorta measuring 41 mm.   TRANSTHORACIC ECHOCARDIOGRAM  08/19/2022   a) 12/17/'23: EF 65 to 70%.  Normal Fxn.  No RWMA.  Severe asymmetrical-septal LVH.  Unable to assess diastolic parameters.  Normal RV fxn, mildly increased thickness.  Severe LA and mild RA dilation with mildly elevated RAP.  (8 mmHg).  Relatively normal valves.;; b) 1/'24: No change. EF 70-75%, Hyperdyamic. Severe LA & Mod RA dilation.  RAP 8 mmHg. Unable to assess LV CFxn    FAMILY HISTORY: Family History  Problem Relation Age of Onset   Coronary artery disease Mother    Heart attack Mother    Coronary artery disease Father    Prostate cancer Father    Diabetes Father     SOCIAL HISTORY: Social History   Socioeconomic History   Marital status: Single    Spouse name: Not on file   Number of children: 3   Years of education: Not on file   Highest education level: Not on file  Occupational History   Occupation: Retired  Tobacco Use   Smoking status: Former    Current packs/day: 0.00    Types: Cigarettes    Quit date: 11/04/1982    Years since quitting: 40.6   Smokeless tobacco: Never  Vaping Use   Vaping status: Never Used  Substance and Sexual Activity   Alcohol use: Not Currently   Drug use: No    Types: Marijuana    Comment:  use to smoke marijuana   Sexual activity: Not Currently  Other Topics Concern   Not on file  Social History Narrative   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 3 grown children. 4 GC.  Quit smoking ~20+ yrs ago.       Emergency Contact/Next of Kin/HPA is Liberty Netherland -daughter   Social Determinants of Health   Financial Resource Strain: Not on file  Food Insecurity: No Food Insecurity (07/10/2022)   Hunger Vital Sign    Worried About Running Out of Food in the Last Year: Never true    Ran Out of Food in the Last Year: Never true  Transportation Needs: No Transportation Needs (07/10/2022)   PRAPARE - Administrator, Civil Service (Medical): No    Lack of Transportation (Non-Medical): No  Physical Activity: Not on file  Stress: Not on file  Social Connections: Not on file  Intimate Partner Violence: Not At Risk (07/05/2022)   Humiliation, Afraid, Rape, and Kick questionnaire    Fear of Current or Ex-Partner: No    Emotionally Abused: No    Physically Abused: No    Sexually Abused: No     PHYSICAL EXAM  GENERAL EXAM/CONSTITUTIONAL: Vitals:  Vitals:   06/25/23 1347  BP: (!) 155/76  Pulse: (!) 42  Resp: 16  Height: 6\' 2"  (1.88 m)   Body mass index is 29.72 kg/m. Wt Readings from Last 3 Encounters:  03/19/23 231 lb 7.7 oz (105 kg)  08/23/22 237 lb 3.4 oz (107.6 kg)  07/05/22 227 lb 4.7 oz (103.1 kg)   Patient is in no distress; well developed, nourished and groomed; neck is supple  MUSCULOSKELETAL: Gait, strength, tone, movements noted in Neurologic exam below  NEUROLOGIC: MENTAL STATUS:     06/25/2023    1:51 PM  MMSE - Mini Mental State Exam  Orientation to time 1  Orientation to Place 5  Registration 3  Attention/ Calculation 2  Recall 0  Language- name 2 objects 2  Language- repeat 1  Language- follow 3 step command 3  Language- read & follow direction 0  Write a sentence 0  Copy design  0  Total score 17    CRANIAL NERVE:  2nd, 3rd, 4th, 6th- Difficulty with visual fields due to bilateral cataracts 5th - facial sensation symmetric 7th - facial strength symmetric 8th - hearing intact 9th - palate elevates symmetrically, uvula midline 11th - shoulder shrug symmetric 12th - tongue protrusion midline  MOTOR:  At least antigravity in the BUE and BLE  COORDINATION:  finger-nose-finger, fine finger movements normal  GAIT/STATION:  Deferred, using a wheelchair      DIAGNOSTIC DATA (LABS, IMAGING, TESTING) - I reviewed patient records, labs, notes, testing and imaging myself where available.  Lab Results  Component Value Date   WBC 11.0 (H) 03/18/2023   HGB 13.7 03/18/2023   HCT 45.3 03/18/2023   MCV 88.0 03/18/2023   PLT 232 03/18/2023      Component Value Date/Time   NA 133 (L) 03/20/2023 0343   NA 138 08/23/2020 1504   K 5.1 03/20/2023 0343   CL 105 03/20/2023 0343   CO2 19 (L) 03/20/2023 0343   GLUCOSE 96 03/20/2023 0343   BUN 20 03/20/2023 0343   BUN 22 08/23/2020 1504   CREATININE 1.44 (H) 03/20/2023 0343   CREATININE 1.64 (H) 03/29/2020 0919   CREATININE 1.17 09/16/2016 1312   CALCIUM 8.8 (L) 03/20/2023 0343   PROT 8.9 (H) 03/18/2023 1549   PROT 8.4 08/23/2020 1504  ALBUMIN 2.3 (L) 03/18/2023 1549   ALBUMIN 3.5 (L) 08/23/2020 1504   AST 23 03/18/2023 1549   AST 16 03/29/2020 0919   ALT 13 03/18/2023 1549   ALT 8 03/29/2020 0919   ALKPHOS 137 (H) 03/18/2023 1549   BILITOT 0.4 03/18/2023 1549   BILITOT 0.5 08/23/2020 1504   BILITOT 0.4 03/29/2020 0919   GFRNONAA 50 (L) 03/20/2023 0343   GFRNONAA 41 (L) 03/29/2020 0919   GFRAA 41 (L) 08/23/2020 1504   GFRAA 47 (L) 03/29/2020 0919   Lab Results  Component Value Date   CHOL 102 03/10/2022   HDL 29.10 (L) 03/10/2022   LDLCALC 57 03/10/2022   TRIG 77.0 03/10/2022   CHOLHDL 3 03/10/2022   Lab Results  Component Value Date   HGBA1C 7.0 (H) 03/10/2022   Lab Results  Component  Value Date   VITAMINB12 273 11/18/2021   Lab Results  Component Value Date   TSH 1.363 01/24/2022    Head CT 08/16/2022 No acute intracranial abnormality. Old left parietooccipital infarct, severe chronic small vessel disease, and moderate cerebral atrophy.   ASSESSMENT AND PLAN  78 y.o. year old male with atrial fibrillation, hypertension, hyperlipidemia, TBI's, lymphedema and recurrent UTIs who is presenting with daughter for memory deficit and confusion.  He also had multiple TBI's, one resulting in subdural hematoma.  On exam today he scored 17 out of 30 on MMSE indicative of impairment.  I have informed patient and daughter that he likely has mild dementia, Alzheimer disease versus vascular dementia.  Will obtain ATN profile, B12 and TSH.  If ATN negative, patient likely has vascular dementia based on CT head.  We also discussed starting Aricept, 5 mg nightly, discussed the side effect of the medication including dizziness, vivid dreams and diarrhea.  I will await the lab results to initiate medication.  They are comfortable with plan.  Continue to follow with your doctors including urology and return in 1 year or sooner if worse or any side effect from the medicine.   1. 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  Exercise Daily Walk A daily 20 minute walk should be part of your routine. Disease related apathy can be a significant roadblock to exercise and the only way to overcome this is to make it a daily routine and perhaps have a reward at the end (something your loved one loves to eat or drink  perhaps) or a personal trainer coming to the home can also be very useful. Most importantly, the patient is much more likely to exercise if the caregiver / spouse does it with him/her. In general a structured, repetitive schedule is best.  General Health: Any diseases which effect your body will effect your brain such as a pneumonia, urinary infection, blood clot, heart attack or stroke. Keep contact with your primary care doctor for regular follow ups.  Sleep. A good nights sleep is healthy for the brain. Seven hours is recommended. If you have insomnia or poor sleep habits we can give you some instructions. If you have sleep apnea wear your mask.  Diet: Eating a heart  healthy diet is also a good idea; fish and poultry instead of red meat, nuts (mostly non-peanuts), vegetables, fruits, olive oil or canola oil (instead of butter), minimal salt (use other spices to flavor foods), whole grain rice, bread, cereal and pasta and wine in moderation.Research is now showing that the MIND diet, which is a combination of The Mediterranean diet and the DASH diet, is beneficial for cognitive processing and longevity. Information about this diet can be found in The MIND Diet, a book by Alonna Minium, MS, RDN, and online at [employer].es  Finances, Power of [address] and Advance Directives: You should consider putting legal safeguards in place with regard to financial and medical decision making. While the spouse always has power of attorney for medical and financial issues in the absence of any form, you should consider what you want in case the spouse / caregiver is no longer around or capable of making decisions.   Orders Placed This Encounter  Procedures   ATN PROFILE   TSH   Vitamin B12    No orders of the defined types were placed in this encounter.   Return in about 1 year (around 06/24/2024).  I have spent a total of 65 minutes dedicated to this patient today, preparing  to see patient, performing a medically appropriate examination and evaluation, ordering tests and/or medications and procedures, and counseling and educating the patient/family/caregiver; independently interpreting result and communicating results to the family/patient/caregiver; and documenting clinical information in the electronic medical record.   Windell Norfolk, MD 06/25/2023, 4:38 PM  [facility] [address], [address] [place], [place] [address] [phone]

## 2023-07-01 LAB — ATN PROFILE
A -- Beta-amyloid 42/40 Ratio: 0.14 (ref >0.102)
Beta-amyloid 40: 303.91 pg/mL
Beta-amyloid 42: 42.61 pg/mL
N -- NfL, Plasma: 8.69 pg/mL — ABNORMAL HIGH (ref 0.00–7.64)
T -- p-tau181: 1.95 pg/mL — ABNORMAL HIGH (ref 0.00–0.97)

## 2023-07-01 LAB — TSH: TSH: 1.88 u[IU]/mL (ref 0.450–4.500)

## 2023-07-01 LAB — VITAMIN B12: Vitamin B-12: 523 pg/mL (ref 232–1245)

## 2023-07-03 ENCOUNTER — Other Ambulatory Visit: Payer: Self-pay | Admitting: Neurology

## 2023-07-03 NOTE — Progress Notes (Signed)
Please contact [facility] and informed them that I have recommended Namenda 5 mg BID for his vascular dementia, and they can consider increasing the dose to 10 mg BID in 6 months if patient is able to tolerate it. Thanks

## 2023-07-30 ENCOUNTER — Telehealth: Payer: Self-pay | Admitting: Cardiology

## 2023-07-30 NOTE — Telephone Encounter (Signed)
 Received a call from Annabella Soho NP  at facility that patient is located. Patient has an appointment 08/06/22 with Dr Anner.   T. Muse,NP  states  patient heart rate  has been  in the range of 45- 50. Patient is taking Carvediolol 12.5 mg twice a day , torsemide  40 mg daily.  Muse,NP concerned over low heartrate  - wanted to know Dr Anner opinion about changing or stopping medication  Patient last BNP 571 on 06/23/23. Blood pressure today 156/70  Yesterday 162/80 Other pressures  07/28/23 ---163/76 07/23/23--- 181/81,  140/80  166/72  Low as 122/65.   Last set renal labs   Creat 1.18 Bun 20.7 GFR 64   She states she will decrease Carvedilol  to 6.25 mg twice a day  until further instructions.   RN  informed Muse, NP- will send this message to Dr Anner for review and response. RN instructed to send all documentation with patient to upcoming appointment. Once receive a response will contact  T. Muse, NP   She verbalized understanding

## 2023-08-01 NOTE — Telephone Encounter (Signed)
 That's fine - but if she drops the Coreg dose, she needs to compensate by increasing or adding an additional antihypertensive based on those BP levels.   Generally not a big deal to have HR in 45-50 range unless symptomatic.    Bryan Lemma, MD

## 2023-08-03 NOTE — Telephone Encounter (Signed)
RN called spoke to T. Muse NP . Information given per Dr Herbie Baltimore Verbalized understanding  stated patient 's information will be sent with him for upcoming appointment

## 2023-08-06 LAB — LAB REPORT - SCANNED: EGFR: 50

## 2023-08-07 ENCOUNTER — Encounter: Payer: Self-pay | Admitting: Cardiology

## 2023-08-07 ENCOUNTER — Ambulatory Visit (INDEPENDENT_AMBULATORY_CARE_PROVIDER_SITE_OTHER): Payer: Medicare Other

## 2023-08-07 ENCOUNTER — Ambulatory Visit: Payer: Medicare Other | Attending: Cardiology | Admitting: Cardiology

## 2023-08-07 VITALS — BP 136/76 | HR 48 | Ht 74.0 in | Wt 260.0 lb

## 2023-08-07 DIAGNOSIS — I110 Hypertensive heart disease with heart failure: Secondary | ICD-10-CM

## 2023-08-07 DIAGNOSIS — D6869 Other thrombophilia: Secondary | ICD-10-CM

## 2023-08-07 DIAGNOSIS — I4821 Permanent atrial fibrillation: Secondary | ICD-10-CM

## 2023-08-07 DIAGNOSIS — R5381 Other malaise: Secondary | ICD-10-CM | POA: Diagnosis not present

## 2023-08-07 DIAGNOSIS — I495 Sick sinus syndrome: Secondary | ICD-10-CM

## 2023-08-07 DIAGNOSIS — I4811 Longstanding persistent atrial fibrillation: Secondary | ICD-10-CM

## 2023-08-07 DIAGNOSIS — I872 Venous insufficiency (chronic) (peripheral): Secondary | ICD-10-CM

## 2023-08-07 DIAGNOSIS — I2699 Other pulmonary embolism without acute cor pulmonale: Secondary | ICD-10-CM

## 2023-08-07 DIAGNOSIS — I5032 Chronic diastolic (congestive) heart failure: Secondary | ICD-10-CM

## 2023-08-07 DIAGNOSIS — I10 Essential (primary) hypertension: Secondary | ICD-10-CM

## 2023-08-07 DIAGNOSIS — R001 Bradycardia, unspecified: Secondary | ICD-10-CM

## 2023-08-07 DIAGNOSIS — N1832 Chronic kidney disease, stage 3b: Secondary | ICD-10-CM

## 2023-08-07 DIAGNOSIS — R600 Localized edema: Secondary | ICD-10-CM

## 2023-08-07 MED ORDER — VALSARTAN 40 MG PO TABS: 40.0000 mg | ORAL_TABLET | Freq: Every day | ORAL | 3 refills | Status: DC

## 2023-08-07 NOTE — Progress Notes (Unsigned)
Enrolled patient for a 3 day Zio XT monitor to be mailed to patients daughters home  [address], [phone]

## 2023-08-07 NOTE — Progress Notes (Unsigned)
Cardiology Office Note:  .   Date:  08/10/2023  ID:  [name], DOB 02-03-1946, MRN [id number] PCP: Corwin Levins, MD ; Bethena Midget, NP [facility] Providers Cardiologist:  Bryan Lemma, MD     Chief Complaint  Patient presents with   Follow-up    Annual   Atrial Fibrillation    Permanent - - no Sx    Patient Profile: .     [name] is a obese 78 y.o. male with a PMH noted below who presents here for annual follow-up at the request of Corwin Levins, MD. & Bethena Midget, NP [facility]).   Notable PMH: Permanent yeah A-fib ->  History of left occipital CVA 2021 followed by TIA July 2023 H/o Subdural Hematoma Biateral PE while off of DOAC Difficult Control/Labile Hypertension Chronic HFpEF-Hypertensive Heart Disease Chronic Lower Extremity Edema/Venous Hypertension with Stasis Dermatitis related wounds COPD HLD DM-2 with PN CKD 3A-B History of NSVT    [name] was last seen on September 15, 2022 for routine follow-up but also hospital follow-up.  He was admitted for UTI and confusion as well as renal insufficiency.  He was actually found to have possible bilateral PE.  He was restarted on Eliquis, which have been discontinued because of a subdural hematoma noted in September 2023.  Following these recent hospitalizations he was admitted to [facility] as he has become essentially wheelchair-bound due to profound lower extremity weakness.  Lower extremity edema continue to be an issue with venous stasis ulcers.  He did note improvement in his breathing after being treated for PE.  No further neurologic issues, other than memory issues.  He was in the hospital back in March 2024 with issues of dysphagia and unsteady gait; he was admitted in the hospital again in August 2024 with diabetic ulcer and hyperkalemia. => Both spironolactone and potassium supplementation help.  Creatinine stable at  baseline.  He was referred back for reevaluation by the [facility] provider, who was concerned about his heart rates.  They backed down his carvedilol dose to half tablet twice daily.  My recommendation was to then consider adding a different antihypertensive agent.  His blood pressure recordings have been mostly anywhere from the 130s to 150s systolic over 60s and 70s diastolic occasional 90s.  Heart rates have been 50s to 70s.  Subjective  Discussed the use of AI scribe software for clinical note transcription with the patient, who gave verbal consent to proceed.  History of Present Illness   The patient, with a history of atrial fibrillation, Chronic HFpEF (Hypertensive Heart Disease), and chronic leg edema (with Venous Stasis ulcers), has been experiencing a persistently low heart rate, as reported by the nurse practitioner at his care facility. The patient's heart rate has been in the 40s, which has led to a reduction in his carvedilol dosage - from 12.5 mg BID to 6.25 mg BID. The patient denies any symptoms of dizziness, weakness, or feeling like he is going to pass out.  The patient's leg edema has been a long-standing issue, with the left leg being more affected than the right. Recently, the patient developed an infection on the right leg. The patient's leg edema is managed with torsemide, which was switched from furosemide at some point. The patient denies any abdominal swelling.  The patient also has a history of high blood pressure, which has been recorded in the range of 120s to 150s. The patient denies any chest  pain, pressure, or tightness, even during exertional activities such as getting dressed or bathed. The patient also denies any increase in shortness of breath.  The patient's mobility has been significantly reduced due to the leg edema and infection. The patient is unable to walk and uses a wheelchair for mobility.  He mostly is either in the wheelchair (reclining / padded chair)  or in bed.    The patient's kidney function has been stable, and he has not seen a kidney specialist since a single episode of dialysis for high potassium levels. The patient denies any stroke-like symptoms such as nausea, weakness, numbness on one side, slurred speech, or drooling.  The patient's weight has been relatively stable, with weights taken twice a week at the care facility. The patient denies any significant weight gain. The patient sleeps on three pillows and denies waking up due to breathlessness.  In summary, the patient with a history of atrial fibrillation and chronic leg edema presents with a persistently low heart rate and stable symptoms. The patient's leg edema is managed with torsemide, and he denies any new or worsening symptoms. The patient's mobility is significantly reduced due to the leg edema and infection. The patient's kidney function has been stable, and his blood pressure is slightly elevated.      ROS:  Review of Systems - Negative except Symptoms noted in HPI; pretty much wheelchair-bound, not ambulatory at all.  Generalized weakness.  Some confusion and memory loss.  Borderline urine incontinence.  No melena, hematochezia and hematuria or epistaxis.    Objective   Current Meds  Medication Sig   acetaminophen (TYLENOL) 500 MG tablet Take 500 mg by mouth in the morning and at bedtime.   apixaban (ELIQUIS) 5 MG TABS tablet Take 1 tablet (5 mg total) by mouth 2 (two) times daily.   Blood Glucose Monitoring Suppl (ACCU-CHEK GUIDE ME) w/Device KIT Use as directed twice per day E11.9   carvedilol (COREG) 12.5 MG tablet Take 1 tablet (12.5 mg total) by mouth 2 (two) times daily with a meal. => Recently reduced by PCP to 6.25 mg twice daily for bradycardia   fluticasone (FLONASE) 50 MCG/ACT nasal spray Place 1 spray into both nostrils daily.   gabapentin (NEURONTIN) 100 MG capsule Take 100 mg by mouth 2 (two) times daily.   glucose blood (ACCU-CHEK GUIDE) test strip  Use as instructed twice per day E11.9   insulin aspart (NOVOLOG FLEXPEN) 100 UNIT/ML FlexPen 0-9 Units, Subcutaneous, 3 times daily with meals CBG < 70: Implement Hypoglycemia measures CBG 70 - 120: 0 units CBG 121 - 150: 1 unit CBG 151 - 200: 2 units CBG 201 - 250: 3 units CBG 251 - 300: 5 units CBG 301 - 350: 7 units CBG 351 - 400: 9 units CBG > 400: call MD   Lancet Devices [facility] DELICA PLUS LANCING) MISC    magnesium oxide (MAG-OX) 400 (240 Mg) MG tablet Take 1 tablet (400 mg total) by mouth 2 (two) times daily. (Patient taking differently: Take 400 mg by mouth daily.)   Multiple Vitamin (MULTIVITAMIN WITH MINERALS) TABS tablet Take 1 tablet by mouth daily.   oxyCODONE (OXY IR/ROXICODONE) 5 MG immediate release tablet Take by mouth.   phenylephrine-shark liver oil-mineral oil-petrolatum (PREPARATION H) 0.25-14-74.9 % rectal ointment Place 1 Application rectally daily as needed for hemorrhoids.   polyethylene glycol (MIRALAX / GLYCOLAX) 17 g packet Take 17 g by mouth daily as needed for mild constipation.   prednisoLONE acetate (PRED FORTE) 1 %  ophthalmic suspension Apply to eye.   rosuvastatin (CRESTOR) 20 MG tablet Take 20 mg by mouth every evening.   tamsulosin (FLOMAX) 0.4 MG CAPS capsule Take 1 capsule (0.4 mg total) by mouth daily after supper.   torsemide (DEMADEX) 20 MG tablet Take 20 mg by mouth 2 (two) times daily.   Vitamin D, Ergocalciferol, (DRISDOL) 1.25 MG (50000 UNIT) CAPS capsule Take 50,000 Units by mouth once a week.   Pertinent cardiac medications: Carvedilol currently at 6.25 mg twice daily;  Studies Reviewed: Marland Kitchen   EKG Interpretation Date/Time:  Friday August 07 2023 08:52:04 EST Ventricular Rate:  48 PR Interval:    QRS Duration:  112 QT Interval:  548 QTC Calculation: 489 R Axis:   -53  Text Interpretation: Atrial fibrillation with slow ventricular response Left anterior fascicular block Moderate voltage criteria for LVH, may be normal variant ( R in aVL ,  Sequan product ) Septal infarct (cited on or before 18-Mar-2023) Possible Lateral infarct (cited on or before 18-Mar-2023) When compared with ECG of 18-Mar-2023 15:45, Borderline criteria for Inferior infarct are no longer Present T wave inversion no longer evident in Inferior leads QT has lengthened Confirmed by Bryan Lemma ([id number]) on 08/07/2023 9:07:24 AM    Echocardiogram 07/06/2022> EF 65 to 70%.  Normal Fxn.  No RWMA.  Severe asymmetrical-septal LVH.  Unable to assess diastolic parameters.  Normal RV fxn, mildly increased thickness.  Severe LA and mild RA dilation with mildly elevated RAP.  (8 mmHg).  Relatively normal valves. Echocardiogram 08/19/2022: Hyperdynamic LV with EF 70 to 75%.  Moderate concentric LVH.  No RWMA.  Unable to assess PAP (poor TR signal), however RAP estimated at 8 mmHg.  Severe LA and moderate RA dilation.  Aortic root measured at 41 mm. => No change  Labs from 08/06/2023: BNP 295, NA 139, K3.9, CL 103, CO2 26, BUN 21.2, CR 1.45, GLU 117, CA 8.9;   Lab Results  Component Value Date   NA 133 (L) 03/20/2023   K 5.1 03/20/2023   CREATININE 1.44 (H) 03/20/2023   GFRNONAA 50 (L) 03/20/2023   GLUCOSE 96 03/20/2023    Risk Assessment/Calculations:    CHA2DS2-VASc Score = 8   This indicates a 10.8% annual risk of stroke. The patient's score is based upon: CHF History: 1 HTN History: 1 Diabetes History: 1 Stroke History: 2 Vascular Disease History: 1 Age Score: 2 Gender Score: 0   On longstanding DOAC       Physical Exam:   VS:  BP 136/76   Pulse (!) 48   Ht 6\' 2"  (1.88 m)   Wt 260 lb (117.9 kg)   SpO2 98%   BMI 33.38 kg/m    Wt Readings from Last 3 Encounters:  08/07/23 260 lb (117.9 kg)  03/19/23 231 lb 7.7 oz (105 kg)  08/23/22 237 lb 3.4 oz (107.6 kg)    GEN: Chronically ill-appearing, older than stated age).  NAD chronic wheelchair (most part wheelchair-bound).  Seems to be significantly weak and deconditioned.  Bilateral edema with [employer] on.  Somewhat confused.  Poor memory. NECK: No JVD; No carotid bruits CARDIAC: Bradycardic with irregular rhythm; normal S1, S2; no murmurs, rubs, gallops RESPIRATORY:  Clear to auscultation without rales, wheezing or rhonchi ; nonlabored, good air movement. ABDOMEN: Soft, non-tender, non-distended EXTREMITIES:   Left leg more swollen than right. Right leg with wound. Swelling soft under wraps. Wraps changed twice a week, extending from feet to almost knees.  ASSESSMENT AND PLAN: .    Problem List Items Addressed This Visit       Cardiology Problems   Bilateral pulmonary embolism (HCC) (Chronic)   Unfortunate situation where he developed bilateral PE after stopping his DOAC for SDH. -Back on Eliquis      Relevant Medications   torsemide (DEMADEX) 20 MG tablet   valsartan (DIOVAN) 40 MG tablet   Chronic diastolic CHF (congestive heart failure), NYHA class 2 (HCC) (Chronic)   Difficult to assess, but it does not seem that he is CHF symptoms are the main focus.  He does have some orthopnea but mostly sleeps upright present comfort.  His lower EXTR edema is probably more related to venous stasis and potentially pulmonary hypertension. -Continue carvedilol, but with reduced dose and add low-dose valsartan 40 mg daily for afterload reduction -Continue current dosing of torsemide (40 mg daily) with additional dosing as needed for weight gain more than 3 pounds or worsening edema      Relevant Medications   torsemide (DEMADEX) 20 MG tablet   valsartan (DIOVAN) 40 MG tablet   Hypercoagulable state due to longstanding persistent atrial fibrillation (HCC) (Chronic)   CHA2DS2-VASc score now 8 with recent stroke. Also developed bilateral PE while off of DOAC for subdural hematoma.  Would not hold Eliquis for prolonged period of time unless there is notably an issue, but otherwise should be safe holding for 2 to 3 days preop for surgeries or procedures.      Relevant Medications    torsemide (DEMADEX) 20 MG tablet   valsartan (DIOVAN) 40 MG tablet   Hypertensive heart disease with chronic diastolic congestive heart failure (HCC) - Primary (Chronic)   Blood pressure readings range from high 120s to 150s.  Carvedilol dose recently reduced, potentially impacting blood pressure control. -Add Valsartan 40mg  for additional blood pressure control/afterload reduction -Recheck BMP in 2 weeks to monitor kidney function.      Relevant Medications   torsemide (DEMADEX) 20 MG tablet   valsartan (DIOVAN) 40 MG tablet   Other Relevant Orders   EKG 12-Lead (Completed)   LONG TERM MONITOR (3-14 DAYS)   Basic metabolic panel   Permanent atrial fibrillation (HCC) - now off amiodarone; asymptomatic. CHA2DS2-VASc Score -5 (on Xarelto) (Chronic)   Permanent condition, rate controlled with beta-blocker. Stable with a heart rate of 48. No symptoms of palpitations, chest pain, or shortness of breath. Carvedilol dose recently reduced due to low heart rate. -Continue Carvedilol at reduced dose. (6.25 mg BID) -Continue Eliquis (monitor renal function to adjust dosing accordingly) -Order 3-day Zio patch to evaluate heart rate trends.      Relevant Medications   torsemide (DEMADEX) 20 MG tablet   valsartan (DIOVAN) 40 MG tablet   Other Relevant Orders   EKG 12-Lead (Completed)   LONG TERM MONITOR (3-14 DAYS)   Tachycardia-bradycardia syndrome (HCC) (Chronic)   Most recently noted to have heart rates in the 40s and 50s with beta-blocker dose being reduced by PCP. Medically, he is not currently symptomatic with this reduced heart rate.  At this point no indication for pacemaker. -3-day Zio patch monitor to assess heart rate range -Continue with low-dose carvedilol at 6.25 mg twice daily      Relevant Medications   torsemide (DEMADEX) 20 MG tablet   valsartan (DIOVAN) 40 MG tablet   Other Relevant Orders   EKG 12-Lead (Completed)   LONG TERM MONITOR (3-14 DAYS)   Venous insufficiency  (chronic) (peripheral) with chronic lower extremity edema (Chronic)  Chronic issue, likely due to venous insufficiency. Recently switched from Furosemide to Torsemide for better response.  Left leg more swollen than right. Right leg with wound. Swelling soft under wraps. Wraps changed twice a week, extending from feet to almost knees. -Continue Torsemide 40mg  daily. -Recheck weight daily to monitor fluid status. -If weight increases by more than 3 pounds in a day, administer an additional half dose of Torsemide. -Encourage leg elevation and movement to promote venous return.      Relevant Medications   torsemide (DEMADEX) 20 MG tablet   valsartan (DIOVAN) 40 MG tablet     Other   Bradycardia   Previously had his beta-blocker dose reduced, now on carvedilol as opposed to metoprolol. Again reduced to 6.5 mg twice daily -3-day Zio patch monitor to assess heart rate      Chronic kidney disease, stage 3b (HCC) (Chronic)   Thankfully, his most recent creatinine is stable at roughly 1.4-1.6. -Reassess renal function after addition of ARB      Physical deconditioning (Chronic)   Limited mobility due to leg swelling and pain. -Encourage physical therapy and leg exercises to improve conditioning. -Encouraged foot elevation or leg exercises for edema as well.       Follow-Up: Return in about 2 months (around 10/05/2023) for Routine Follow-up after testing ~ 1-2 months, Routine follow up with me.  Total time spent: 31 min spent with patient + 15 min spent charting = 46 min I spent 46 minutes in the care of [name] today including reviewing outside labs from [facility] (2 minutes), reviewing studies (2 minutes), face to face time discussing treatment options (31 minutes), reviewing records from epic and [facility] (3 minutes), 8 minutes dictating, and documenting in the encounter.    Signed, Marykay Lex, MD, MS Bryan Lemma, M.D., M.S. Interventional Cardiologist   [facility]  Pager # [phone] Phone # [phone] [address] [place], [place] [address]

## 2023-08-07 NOTE — Patient Instructions (Signed)
Medication Instructions:    Start taking valsartan 40 mg daily   Continue all other medications  *If you need a refill on your cardiac medications before your next appointment, please call your pharmacy*   Lab Work: BMP  in 2 weeks after starting Valsartan 40 mg  daily   If you have labs (blood work) drawn today and your tests are completely normal, you will receive your results only by: MyChart Message (if you have MyChart) OR A paper copy in the mail If you have any lab test that is abnormal or we need to change your treatment, we will call you to review the results.   Testing/Procedures: Will be mailed to daughters home She will take to [facility] rehab Your physician has recommended that you wear a holter monitor 3 day ZIo . Holter monitors are medical devices that record the heart's electrical activity. Doctors most often use these monitors to diagnose arrhythmias. Arrhythmias are problems with the speed or rhythm of the heartbeat. The monitor is a small, portable device. You can wear one while you do your normal daily activities. This is usually used to diagnose what is causing palpitations/syncope (passing out).    Follow-Up: At [facility], you and your health needs are our priority.  As part of our continuing mission to provide you with exceptional heart care, we have created designated Provider Care Teams.  These Care Teams include your primary Cardiologist (physician) and Advanced Practice Providers (APPs -  Physician Assistants and Nurse Practitioners) who all work together to provide you with the care you need, when you need it.     Your next appointment:   3 month(s)  The format for your next appointment:   In Person  Provider:   Bryan Lemma, MD    Other Instructions   ZIO XT- Long Term Monitor Instructions  Your physician has requested you wear a ZIO patch monitor for 14 days.  This is a single patch monitor. [employer] supplies one patch monitor per  enrollment. Additional stickers are not available. Please do not apply patch if you will be having a Nuclear Stress Test,  Echocardiogram, Cardiac CT, MRI, or Chest Xray during the period you would be wearing the  monitor. The patch cannot be worn during these tests. You cannot remove and re-apply the  ZIO XT patch monitor.  Your ZIO patch monitor will be mailed 3 day USPS to your address on file. It may take 3-5 days  to receive your monitor after you have been enrolled.  Once you have received your monitor, please review the enclosed instructions. Your monitor  has already been registered assigning a specific monitor serial # to you.  Billing and Patient Assistance Program Information  We have supplied [employer] with any of your insurance information on file for billing purposes. [employer] offers a sliding scale Patient Assistance Program for patients that do not have  insurance, or whose insurance does not completely cover the cost of the ZIO monitor.  You must apply for the Patient Assistance Program to qualify for this discounted rate.  To apply, please call [employer] at [phone], select option 4, select option 2, ask to apply for  Patient Assistance Program. Meredeth Ide will ask your household income, and how many people  are in your household. They will quote your out-of-pocket cost based on that information.  [employer] will also be able to set up a 75-month, interest-free payment plan if needed.  Applying the monitor   Shave hair from upper left  chest.  Hold abrader disc by orange tab. Rub abrader in 40 strokes over the upper left chest as  indicated in your monitor instructions.  Clean area with 4 enclosed alcohol pads. Let dry.  Apply patch as indicated in monitor instructions. Patch will be placed under collarbone on left  side of chest with arrow pointing upward.  Rub patch adhesive wings for 2 minutes. Remove white label marked "1". Remove the white  label marked "2". Rub patch  adhesive wings for 2 additional minutes.  While looking in a mirror, press and release button in center of patch. A small green light will  flash 3-4 times. This will be your only indicator that the monitor has been turned on.  Do not shower for the first 24 hours. You may shower after the first 24 hours.  Press the button if you feel a symptom. You will hear a small click. Record Date, Time and  Symptom in the Patient Logbook.  When you are ready to remove the patch, follow instructions on the last 2 pages of Patient  Logbook. Stick patch monitor onto the last page of Patient Logbook.  Place Patient Logbook in the blue and white box. Use locking tab on box and tape box closed  securely. The blue and white box has prepaid postage on it. Please place it in the mailbox as  soon as possible. Your physician should have your test results approximately 7 days after the  monitor has been mailed back to [facility].  Call [facility] Customer Care at [phone] if you have questions regarding  your ZIO XT patch monitor. Call them immediately if you see an orange light blinking on your  monitor.  If your monitor falls off in less than 4 days, contact our Monitor department at [phone].  If your monitor becomes loose or falls off after 4 days call [employer] at [phone] for  suggestions on securing your monitor

## 2023-08-10 ENCOUNTER — Encounter: Payer: Self-pay | Admitting: Cardiology

## 2023-08-10 NOTE — Assessment & Plan Note (Signed)
Most recently noted to have heart rates in the 40s and 50s with beta-blocker dose being reduced by PCP. Medically, he is not currently symptomatic with this reduced heart rate.  At this point no indication for pacemaker. -3-day Zio patch monitor to assess heart rate range -Continue with low-dose carvedilol at 6.25 mg twice daily

## 2023-08-10 NOTE — Assessment & Plan Note (Signed)
Previously had his beta-blocker dose reduced, now on carvedilol as opposed to metoprolol. Again reduced to 6.5 mg twice daily -3-day Zio patch monitor to assess heart rate

## 2023-08-10 NOTE — Assessment & Plan Note (Signed)
Limited mobility due to leg swelling and pain. -Encourage physical therapy and leg exercises to improve conditioning. -Encouraged foot elevation or leg exercises for edema as well.

## 2023-08-10 NOTE — Assessment & Plan Note (Signed)
Permanent condition, rate controlled with beta-blocker. Stable with a heart rate of 48. No symptoms of palpitations, chest pain, or shortness of breath. Carvedilol dose recently reduced due to low heart rate. -Continue Carvedilol at reduced dose. (6.25 mg BID) -Continue Eliquis (monitor renal function to adjust dosing accordingly) -Order 3-day Zio patch to evaluate heart rate trends.

## 2023-08-10 NOTE — Assessment & Plan Note (Signed)
CHA2DS2-VASc score now 8 with recent stroke. Also developed bilateral PE while off of DOAC for subdural hematoma.  Would not hold Eliquis for prolonged period of time unless there is notably an issue, but otherwise should be safe holding for 2 to 3 days preop for surgeries or procedures.

## 2023-08-10 NOTE — Assessment & Plan Note (Signed)
Difficult to assess, but it does not seem that he is CHF symptoms are the main focus.  He does have some orthopnea but mostly sleeps upright present comfort.  His lower EXTR edema is probably more related to venous stasis and potentially pulmonary hypertension. -Continue carvedilol, but with reduced dose and add low-dose valsartan 40 mg daily for afterload reduction -Continue current dosing of torsemide (40 mg daily) with additional dosing as needed for weight gain more than 3 pounds or worsening edema

## 2023-08-10 NOTE — Assessment & Plan Note (Signed)
Thankfully, his most recent creatinine is stable at roughly 1.4-1.6. -Reassess renal function after addition of ARB

## 2023-08-10 NOTE — Assessment & Plan Note (Signed)
Blood pressure readings range from high 120s to 150s.  Carvedilol dose recently reduced, potentially impacting blood pressure control. -Add Valsartan 40mg  for additional blood pressure control/afterload reduction -Recheck BMP in 2 weeks to monitor kidney function.

## 2023-08-10 NOTE — Assessment & Plan Note (Signed)
Chronic issue, likely due to venous insufficiency. Recently switched from Furosemide to Torsemide for better response.  Left leg more swollen than right. Right leg with wound. Swelling soft under wraps. Wraps changed twice a week, extending from feet to almost knees. -Continue Torsemide 40mg  daily. -Recheck weight daily to monitor fluid status. -If weight increases by more than 3 pounds in a day, administer an additional half dose of Torsemide. -Encourage leg elevation and movement to promote venous return.

## 2023-08-10 NOTE — Assessment & Plan Note (Signed)
Unfortunate situation where he developed bilateral PE after stopping his DOAC for SDH. -Back on Eliquis

## 2023-08-21 ENCOUNTER — Telehealth: Payer: Self-pay | Admitting: Cardiology

## 2023-08-21 LAB — LAB REPORT - SCANNED: EGFR: 48

## 2023-08-21 NOTE — Telephone Encounter (Signed)
 Abnormal results   Please advise.

## 2023-08-21 NOTE — Telephone Encounter (Signed)
Received call from [place] with [name] states:   -abnormal Zio path result  -patient has a slow A. Fib 38BPM lasting 60 seconds, page 9 on strip #2   RN spoke to Dr. Herbie Baltimore  -No changes to treatment/plan at this time

## 2023-08-21 NOTE — Telephone Encounter (Signed)
 Attempted to call patient, no answer left message requesting a call back.

## 2023-08-25 NOTE — Telephone Encounter (Signed)
 Patient identification verified by 2 forms. Bertina Cooks, RN    Called and spoke to patients daughter Imelda Mis provider message below  Imelda states patient lives in nursing home, will relay message  Advised Karena to keep 4/2 OV with Dr. Anner Imelda has no questions or concerns at this time

## 2023-09-08 ENCOUNTER — Encounter: Payer: Self-pay | Admitting: Internal Medicine

## 2023-09-08 ENCOUNTER — Ambulatory Visit (INDEPENDENT_AMBULATORY_CARE_PROVIDER_SITE_OTHER): Payer: Medicare Other | Admitting: Internal Medicine

## 2023-09-08 VITALS — BP 124/64 | Temp 97.9°F | Wt 241.1 lb

## 2023-09-08 DIAGNOSIS — I517 Cardiomegaly: Secondary | ICD-10-CM | POA: Diagnosis not present

## 2023-09-08 DIAGNOSIS — J449 Chronic obstructive pulmonary disease, unspecified: Secondary | ICD-10-CM

## 2023-09-08 DIAGNOSIS — R9389 Abnormal findings on diagnostic imaging of other specified body structures: Secondary | ICD-10-CM | POA: Diagnosis not present

## 2023-09-08 DIAGNOSIS — J439 Emphysema, unspecified: Secondary | ICD-10-CM | POA: Diagnosis not present

## 2023-09-08 NOTE — Addendum Note (Signed)
Addended by: Delrae Rend on: 09/08/2023 12:43 PM   Modules accepted: Orders

## 2023-09-08 NOTE — Patient Instructions (Addendum)
It was a pleasure to see you today!  Please schedule follow up scheduled with myself as needed. I can do virtual visits if that is easier for you.   Please call [phone] if issues or concerns arise. You can also send [place] a message through MyChart, but but aware that this is not to be used for urgent issues and it may take up to 5-7 days to receive a reply. Please be aware that you will likely be able to view your results before I have a chance to respond to them. Please give [place] 5 business days to respond to any non-urgent results.    You had a chest xray in August 2024 which show an enlarged heart and low lung volumes from not taking in a deep breath. Nothing that needs further follow up at this time.   For the copd - continue the albuterol inhaler as needed. If you develop recurrent bronchitis or respiratory issues we can always revisit adding a maintenance inhaler.   Stay up to date with vaccinations like RSV, covid, influenza.

## 2023-09-08 NOTE — Progress Notes (Signed)
[name]    [id number]    April 14, 1946  Primary Care Physician:John, [name], MD  Referring Physician: Sharlet Salina, MD [address] [place],  [place] [address] Reason for Consultation: "bibasilar atelectasis" Date of Consultation: 09/08/2023  Chief complaint:   Chief Complaint  Patient presents with   Consult    No symptoms.  Lives in a facility.     HPI: [name] is a 78 y.o. man with past medical history of atrial fibrillation on DOAC, h/o bilateral PE while DOAC was held for SDH, congestive heart failure, CKD chronic venous insufficincy who currently resides in SNF due to significant debility and lower extremity weakness. Here with his daughter who is a Engineer, civil (consulting). They are unclear why the appointment was made. It appears he was having shortness of breath in December and a chest xray was done at [facility] and showed bibasilar atelectasis which may be why he was referred.  He is bed bound and does not ambulate much. He says his dyspnea has resolved.   Denies recurrent pneumonia or bronchitis.   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  Exposures: lives in nursing home Smoking history: former 33 pack years, quit in 1984  Social History   Occupational History   Occupation: Retired  Tobacco Use   Smoking status: Former    Current packs/day: 0.00    Average packs/day: 1.5 packs/day for 22.3 years (33.4 ttl pk-yrs)    Types: Cigarettes    Start date: 8    Quit date: 11/04/1982    Years since quitting: 40.8   Smokeless tobacco: Never  Vaping Use   Vaping status: Never Used  Substance and Sexual Activity   Alcohol use: Not Currently   Drug use: No    Types: Marijuana    Comment: use to smoke marijuana   Sexual activity: Not Currently    Relevant family history:  Family History  Problem Relation Age of Onset   Coronary artery disease Mother    Heart attack Mother    Coronary  artery disease Father    Prostate cancer Father    Diabetes Father     Past Medical History:  Diagnosis Date   Adenoma 05/2008   Alcoholism in recovery [facility])    BPH (benign prostatic hyperplasia)    Chronic LBP    Hip & Back -- Se[facility]. Retia Passe [facility]   CKD (chronic kidney disease)    with AKI requiring brief CRRT 02/2020   Colon polyps    Controlled type 2 diabetes mellitus with neuropathy (HCC) 01/2007   COPD (chronic obstructive pulmonary disease) (HCC)    Diverticulitis of colon    ED (erectile dysfunction)    s/p Penile prosthesis (09/2010)   History of prostate cancer    Dr. Vernie Ammons   History of sick sinus syndrome    reduced BB dose for Bradycardia   HLD (hyperlipidemia)    Hypertension    Impaired glucose tolerance 12/21/2013   Nephrolithiasis    Osteoarthritis of both hips    PAF (paroxysmal atrial fibrillation) (HCC)    No longer on Amiodarone.  Not on Anticoaguation b/c no recurrence.   Peripheral neuropathy 12/21/2013   Stroke [facility])    left occipital CVA ~ 2018; possible TIA 01/24/22   Subdural hematoma (HCC) 04/04/2022   Anticoagulation stopped   Venous insufficiency     Past Surgical History:  Procedure Laterality Date   IR ANGIO INTRA EXTRACRAN SEL COM CAROTID INNOMINATE UNI R MOD SED  04/17/2022   LEFT HEART CATH AND CORONARY ANGIOGRAPHY  2003   Normal Coronary Arteries.   NM MYOVIEW LTD  02/21/2020   EF 60%.  Medium sized mild severity defect in the basal inferior, mid inferior and apical inferior location-suggesting of ischemia.  Read as low-intermediate risk.  (On cardiology review this is felt to be a fixed defect either related to prior infarct versus diaphragmatic attenuation.  Felt to be low risk)   PENILE PROSTHESIS IMPLANT  06/21/2012   Procedure: PENILE PROTHESIS INFLATABLE;  Surgeon: Garnett Farm, MD;  Location: [facility];  Service: Urology;  Laterality: N/A;  REMOVAL AND REPLACEMENT OF SOME  OF PROSTHESIS  (AMS)    PENILE PROSTHESIS PLACEMENT  09/2010   PROSTATECTOMY     RADIOLOGY WITH ANESTHESIA N/A 04/17/2022   Procedure: Right middle meningeal artery embolization;  Surgeon: Lisbeth Renshaw, MD;  Location: [facility] OR;  Service: Radiology;  Laterality: N/A;   REMOVAL OF PENILE PROSTHESIS N/A 02/15/2018   Procedure: REMOVAL OF PENILE PROSTHESIS;  Surgeon: Ihor Gully, MD;  Location: [facility] ORS;  Service: Urology;  Laterality: N/A;   TRANSTHORACIC ECHOCARDIOGRAM  01/2022   EF 70-75%:Marland Kitchen  Hyperdynamic LV with no RWMA.  Mild LVH.  Severely dilated left atrium suggesting notable diastolic dysfunction, but unable to interpret.  Mildly enlarged RV with normal function.  Normal PAP despite moderate dilated RA and increased RAP/CVP.Marland Kitchen  No MS or MR.  No AAS or AI.  Aortic root measuring 42 mm with ascending aorta measuring 41 mm.   TRANSTHORACIC ECHOCARDIOGRAM  08/19/2022   a) 12/17/'23: EF 65 to 70%.  Normal Fxn.  No RWMA.  Severe asymmetrical-septal LVH.  Unable to assess diastolic parameters.  Normal RV fxn, mildly increased thickness.  Severe LA and mild RA dilation with mildly elevated RAP.  (8 mmHg).  Relatively normal valves.;; b) 1/'24: No change. EF 70-75%, Hyperdyamic. Severe LA & Mod RA dilation.  RAP 8 mmHg. Unable to assess LV CFxn     Physical Exam: Blood pressure 124/64, temperature 97.9 F (36.6 C), weight 241 lb 1.6 oz (109.4 kg). Gen:      No acute distress, chronically ill, in reclining wheelchair/stretcher ENT:  no nasal polyps, mucus membranes moist Lungs:    No increased respiratory effort, symmetric chest wall excursion, clear to auscultation bilaterally, bibasilar crackles CV:         Regular rate and rhythm; systolic murmur, no gallops or rubs. Abd:      + bowel sounds; soft, non-tender; no distension MSK: no acute synovitis of DIP or PIP joints, no mechanics hands.  Skin:      chronic lower extremity venous stassis Neuro: normal speech,delayed, no focal facial asymmetry Psych: alert  and oriented x3, normal mood and affect   Data Reviewed/Medical Decision Making:  Independent interpretation of tests: Imaging:  Review of patient's chest xray August 2024 images revealed cardiomegaly, low lung volumes. The patient's images have been independently reviewed by me.    PFTs: I have personally reviewed the patient's PFTs and normal spirometry, mild restriction to ventilation likely secondary to habitus.    Latest Ref Rng & Units 09/08/2014   11:24 AM  PFT Results  FVC-Pre L 3.26   FVC-Predicted Pre % 75   FVC-Post L 3.02   FVC-Predicted Post % 69   Pre FEV1/FVC % % 84   Post FEV1/FCV % % 88  FEV1-Pre L 2.74   FEV1-Predicted Pre % 82   FEV1-Post L 2.64   DLCO uncorrected ml/min/mmHg 31.28   DLCO UNC% % 86   DLVA Predicted % 123   TLC L 5.27   TLC % Predicted % 69   RV % Predicted % 81     Labs:  Lab Results  Component Value Date   NA 133 (L) 03/20/2023   K 5.1 03/20/2023   CO2 19 (L) 03/20/2023   GLUCOSE 96 03/20/2023   BUN 20 03/20/2023   CREATININE 1.44 (H) 03/20/2023   CALCIUM 8.8 (L) 03/20/2023   GFR 52.71 (L) 08/07/2022   EGFR 50.0 08/06/2023   GFRNONAA 50 (L) 03/20/2023   Lab Results  Component Value Date   WBC 11.0 (H) 03/18/2023   HGB 13.7 03/18/2023   HCT 45.3 03/18/2023   MCV 88.0 03/18/2023   PLT 232 03/18/2023     Immunization status:  Immunization History  Administered Date(s) Administered   Fluad Quad(high Dose 65+) 04/02/2022, 04/23/2023   H1N1 08/08/2008   Influenza Split 06/03/2011, 04/15/2012   Influenza Whole 04/20/2008, 04/20/2009   Influenza, High Dose Seasonal PF 06/01/2013, 04/27/2018, 05/11/2019   Influenza,inj,Quad PF,6+ Mos 04/28/2014   Influenza-Unspecified 05/31/2016, 06/20/2017   Moderna Sars-Covid-2 Vaccination 10/20/2019, 11/17/2019, 06/28/2020, 02/13/2021   Pneumococcal Conjugate-13 12/21/2013   Pneumococcal Polysaccharide-23 01/19/2008, 07/29/2017   Td 08/08/2008   Zoster Recombinant(Shingrix)  02/13/2021   Zoster, Live 05/31/2014     I reviewed prior external note(s) from cardiology  I reviewed the result(s) of the labs and imaging as noted above.   I have ordered  Assessment:  H/o tobacco use quit 1984 Mild COPD Abnormal chest xray - low lung volumes cardiomegaly  Plan/Recommendations: You had a chest xray in August 2024 which show an enlarged heart and low lung volumes from not taking in a deep breath. Nothing that needs further follow up at this time.   For the copd - continue the albuterol inhaler as needed. If you develop recurrent bronchitis or respiratory issues we can always revisit adding a maintenance inhaler.   Stay up to date with vaccinations like RSV, covid, influenza.    Return to Care: Return if symptoms worsen or fail to improve. Can do virtual visit since patient is in a nursing home.   Durel Salts, MD Pulmonary and Critical Care Medicine [facility] [phone]  CC: Menzer, Verta Ellen, MD

## 2023-09-09 ENCOUNTER — Telehealth: Payer: Self-pay | Admitting: Cardiology

## 2023-09-09 NOTE — Telephone Encounter (Signed)
Bethena Midget, NP at [facility] rehab recently completed a BMP to recheck kidney function after patient was started on valsartan back in January and at the same time checked BNP.  Kidney function has remained stable after being started on ARB therapy.  Unfortunately his BN P resulted at 3345.  Prior results have been anywhere from 311 up to 500.  His weight has been slowly trending up but has remained stable and he has chronic edema.  Unfortunately with being in rehab I's and O's are not as accurate.  To prevent any further symptoms she had opted to give him furosemide IM 40 mg today and after further discussion of the patient's symptoms and questionable fluid status he will receive furosemide x 3 days with repeat labs in 1 week.  At that time the determination will be made if he needs an increase in his torsemide or if he will continue on 40 mg daily.

## 2023-10-01 LAB — LAB REPORT - SCANNED: EGFR: 54

## 2023-10-02 ENCOUNTER — Telehealth: Payer: Self-pay | Admitting: Cardiology

## 2023-10-02 NOTE — Telephone Encounter (Signed)
 Called and spoke to [employer] states:   -She is NP with patients insurance   -Nurse from nursing home will be faxing recent BNP and daily weights   -his BNP is currently elevated   -patient is not in distress no complaints of SOB   -wanted information sent to Dr. Janus Molder there are additional recommendations  Informed Tiffany message sent

## 2023-10-02 NOTE — Telephone Encounter (Signed)
 Facility is sending over is lab work for the dr to elevate and his weight changes. Please advise

## 2023-10-19 ENCOUNTER — Encounter (HOSPITAL_BASED_OUTPATIENT_CLINIC_OR_DEPARTMENT_OTHER): Payer: Self-pay

## 2023-10-20 ENCOUNTER — Ambulatory Visit: Payer: Medicare Other | Admitting: Cardiology

## 2023-10-21 ENCOUNTER — Encounter: Payer: Self-pay | Admitting: Cardiology

## 2023-10-21 ENCOUNTER — Ambulatory Visit: Payer: Medicare Other | Attending: Cardiology | Admitting: Cardiology

## 2023-10-21 VITALS — BP 102/66 | HR 47 | Ht 74.0 in

## 2023-10-21 DIAGNOSIS — N1832 Chronic kidney disease, stage 3b: Secondary | ICD-10-CM

## 2023-10-21 DIAGNOSIS — I872 Venous insufficiency (chronic) (peripheral): Secondary | ICD-10-CM

## 2023-10-21 DIAGNOSIS — I4821 Permanent atrial fibrillation: Secondary | ICD-10-CM

## 2023-10-21 DIAGNOSIS — I4811 Longstanding persistent atrial fibrillation: Secondary | ICD-10-CM

## 2023-10-21 DIAGNOSIS — I5032 Chronic diastolic (congestive) heart failure: Secondary | ICD-10-CM

## 2023-10-21 DIAGNOSIS — I4729 Other ventricular tachycardia: Secondary | ICD-10-CM

## 2023-10-21 DIAGNOSIS — D6869 Other thrombophilia: Secondary | ICD-10-CM

## 2023-10-21 DIAGNOSIS — I110 Hypertensive heart disease with heart failure: Secondary | ICD-10-CM

## 2023-10-21 DIAGNOSIS — I495 Sick sinus syndrome: Secondary | ICD-10-CM | POA: Diagnosis not present

## 2023-10-21 DIAGNOSIS — R0609 Other forms of dyspnea: Secondary | ICD-10-CM

## 2023-10-21 DIAGNOSIS — E1140 Type 2 diabetes mellitus with diabetic neuropathy, unspecified: Secondary | ICD-10-CM

## 2023-10-21 NOTE — Progress Notes (Signed)
 Cardiology Office Note:  .   Date:  10/25/2023  ID:  [name], DOB 29-Nov-1945, MRN [id number] PCP: Corwin Levins, MD  [facility] Providers Cardiologist:  Bryan Lemma, MD     Chief Complaint  Patient presents with   Follow-up    77-month follow-up after Zio patch   Atrial Fibrillation    Concerns of bradycardia   Congestive Heart Failure    Concern from assisted living about labs and weights.    Patient Profile: .     [name] is an obese - now wheelchair bound 78 y.o. male with a PMH below who presents here for 64-month follow-up at the request of Corwin Levins, MD.  Also followed by  Bethena Midget, NP [facility]).   Notable PMH: Permanent yeah A-fib ->  History of left occipital CVA 2021 followed by TIA July 2023 H/o Subdural Hematoma Biateral PE while off of DOAC Difficult Control/Labile Hypertension Chronic HFpEF-Hypertensive Heart Disease Chronic Lower Extremity Edema/Venous Hypertension with Stasis Dermatitis related wounds COPD HLD DM-2 with PN CKD 3A-B History of NSVT     [name] was last seen on August 07, 2023 for routine follow-up of his HFpEF and A-fib with chronic venous stasis.  Chronic longstanding leg edema.  Unfortunately he has become more more sedentary and limited mobility basically wheelchair-bound.  Now on a long-term assisted living facility.  His weight is stable.  Edema managed by torsemide.  Edema felt to be more related to venous stasis than actual heart failure.  Valsartan dose was reduced to 40 mg daily.  Continued on torsemide 40 mg with additional dosing for weight gain more than 3 pounds in a day.  Remains on Eliquis and carvedilol 6.25 mg twice daily. => Zio patch monitor to assess heart rate as the carvedilol dose had been reduced.   Subjective  Discussed the use of AI scribe software for clinical note transcription with the patient, who gave verbal consent to proceed.  History of Present  Illness History of Present Illness [name] is a 78 year-old male with longstanding atrial fibrillation, and chronic HFpEF with chronic lower extremity venous insufficiency related edema who presents for a two-month follow-up after Zio patch monitor placement to assess heart rate response with concerns of significant bradycardia.  He has longstanding, essentially permanent atrial fibrillation. A Zio patch device was used to monitor his heart rate response, revealing continuous atrial fibrillation with heart rates ranging from 35 to 82 beats per minute and an average of 49 beats per minute. He experienced 26 episodes of ventricular runs, with the longest lasting 18 beats. No sensation of heart skipping or flipping, and no chest pain or pressure.  He experiences heavy breathing, particularly with exertion, such as during transfers. He is mostly in a wheelchair and does not engage in much physical activity. He feels capable of more activity than currently allowed but is uncertain about his capabilities.  He is on carvedilol at 6.25 mg twice daily, with a previous reduction in dose. He has no sensation of being in A-fib, nor has any symptoms of bradycardia. He is also on torsemide, with instructions to take an extra dose if his weight increases significantly. His weight has decreased from 260 pounds in January to 241 pounds recently, and is now back into the 2 50-60 range.  I reviewed his weights and they were all showing an average of about 2583 to 5 pounds.  There was concerned that he had elevated BNP but I  think this is probably not related to CHF since he is not having any worsening PND or orthopnea.  The edema seems to be related to his chronic venous stasis.    He has chronic venous stasis dermatitis and edema, which are stable. No new sores or wounds on his legs. Therapy wraps are applied twice a week to help with swelling and prevent infections. He enjoys regular baths and jacuzzi  sessions.  He has a history of COPD, hypertension, and hyperlipidemia. Blood pressure is well-controlled. No dizziness or wooziness, even when moved around, and no recent chest pain or pressure.    Objective   Current Cardiovascular Meds  apixaban (ELIQUIS) 5 MG TABS tablet Take 1 tablet (5 mg total) by mouth 2 (two) times daily.   carvedilol (COREG) 6.25 MG tablet Take 1 tablet (6.25 mg total) by mouth 2 (two) times daily with a meal.  (Had been reduced from 12.5 mg twice daily to 6.25 last visit)    rosuvastatin (CRESTOR) 20 MG tablet Take 20 mg by mouth every evening.    torsemide (DEMADEX) 20 MG tablet Take 20 mg by mouth 2 (two) times daily.   valsartan (DIOVAN) 40 MG tablet Take 1 tablet (40 mg total) by mouth daily.     Other Medications Sig   acetaminophen (TYLENOL) 500 MG tablet Take 500 mg by mouth in the morning and at bedtime.   albuterol (VENTOLIN HFA) 108 (90 Base) MCG/ACT inhaler Inhale 1-2 puffs into the lungs every 6 (six) hours as needed.   fluticasone (FLONASE) 50 MCG/ACT nasal spray Place 1 spray into both nostrils daily.   gabapentin (NEURONTIN) 100 MG capsule Take 100 mg by mouth 2 (two) times daily.   Magnesium Oxide -Mg Supplement 400 MG CAPS Take by mouth.   Multiple Vitamin (MULTIVITAMIN WITH MINERALS) TABS tablet Take 1 tablet by mouth daily.   oxyCODONE (OXY IR/ROXICODONE) 5 MG immediate release tablet Take by mouth.   phenylephrine-shark liver oil-mineral oil-petrolatum (PREPARATION H) 0.25-14-74.9 % rectal ointment Place 1 Application rectally daily as needed for hemorrhoids.   polyethylene glycol (MIRALAX / GLYCOLAX) 17 g packet Take 17 g by mouth daily as needed for mild constipation.   sertraline (ZOLOFT) 25 MG tablet Take 25 mg by mouth daily.   tamsulosin (FLOMAX) 0.4 MG CAPS capsule Take 1 capsule (0.4 mg total) by mouth daily after supper.   Vitamin D, Ergocalciferol, (DRISDOL) 1.25 MG (50000 UNIT) CAPS capsule Take 50,000 Units by mouth once a week.      Studies Reviewed: Marland Kitchen       LABS Cr: 1.36 BUN: 27 Cr: 1.62 (10/04/2023)  DIAGNOSTIC Zio patch heart rate monitor: Atrial fibrillation with heart rate range 35-82 bpm, average 49 bpm, 26 ventricular runs of 7-18 beats lasting 2.5-8 seconds  Risk Assessment/Calculations:    CHA2DS2-VASc Score = 8   This indicates a 10.8% annual risk of stroke. The patient's score is based upon: CHF History: 1 HTN History: 1 Diabetes History: 1 Stroke History: 2 Vascular Disease History: 1 Age Score: 2 Gender Score: 0   On DOAC      Physical Exam:   VS:  BP 102/66   Pulse (!) 47   Ht 6\' 2"  (1.88 m)   SpO2 94%   BMI 30.96 kg/m    Wt Readings from Last 3 Encounters:  09/08/23 241 lb 1.6 oz (109.4 kg)  08/07/23 260 lb (117.9 kg)  03/19/23 231 lb 7.7 oz (105 kg)  Unfortunately, weight not obtained today. GEN: Well  nourished, well developed in no acute distress; chronically ill - wheelchair bound; noted wgt loss. NECK: No JVD; No carotid bruits CARDIAC: Bradycardic rate with irregular rhythm; distant S1, S2; no murmurs, rubs, gallops RESPIRATORY:  Clear to auscultation without rales, wheezing or rhonchi ; nonlabored, good air movement. ABDOMEN: Soft, non-tender, non-distended EXTREMITIES: At least 2+ edema up with support wraps in place from toes to the knees.  No tenderness.  Profound leg weakness.  Essentially wheelchair-bound.    ASSESSMENT AND PLAN: .    Problem List Items Addressed This Visit       Cardiology Problems   Chronic diastolic CHF (congestive heart failure), NYHA class 2 (HCC) (Chronic)   No real PND orthopnea.  Very difficult to determine if his apnea is related to deconditioning and immobility.  All we have to do is low-dose carvedilol and valsartan at this point along with torsemide.  Limited by renal insufficiency.  Not sure how much help BNP or proBNP levels will be given his renal insufficiency and longstanding disease.  Would be more interested in weight  changes and level of dyspnea or edema.  The recorded daily weight range is actually been pretty consistent ensuring that he does take the extra doses of Lasix if he is above his average.  The average weight will need to be adjusted over the course of couple weeks to see if that he stays all stable weight.  He had put on some weight after Nishan losing weight back when he had a stroke.  I think his average weight range in the recordings I saw was between 256 to 288 pounds      Relevant Medications   carvedilol (COREG) 6.25 MG tablet   Hypercoagulable state due to longstanding persistent atrial fibrillation (HCC) (Chronic)   CHA2DS2-VASc score is now 8 following stroke.  Remains on stable dose of Eliquis 5 mg twice daily for combination of stroke prevention but also history of DVT. Would avoid holding Eliquis for prolonged period of time unless she is a significant bleeding issue.  Otherwise for procedures or surgeries would recommend bridging with Lovenox while holding for 2 days preop.      Relevant Medications   carvedilol (COREG) 6.25 MG tablet   Hypertensive heart disease with chronic diastolic congestive heart failure (HCC) (Chronic)   After longstanding issues with hypertension, he is now hypotensive on current meds. Blood pressure well-controlled.  Vasartan dose reduced previously because of low blood pressures.  Further carvedilol reduction planned to slightly increase blood pressure. - Reduce carvedilol to 3.125 mg twice daily and continue valsartan 40 mg daily -Continue current doses of torsemide with sliding scale as noted.      Relevant Medications   carvedilol (COREG) 6.25 MG tablet   NSVT (nonsustained ventricular tachycardia) (HCC) (Chronic)   26 episodes of ventricular runs, longest 18 beats. Not life-threatening, monitored. No immediate treatment changes needed unless episodes increase. Continue low-dose beta-blocker.      Relevant Medications   carvedilol (COREG) 6.25  MG tablet   Permanent atrial fibrillation (HCC) - now off amiodarone; asymptomatic. CHA2DS2-VASc Score -5 (on Xarelto) (Chronic)   Longstanding, essentially permanent Afib with bradycardia. Current heart rate slower than desired but not pacemaker level. Adjusting carvedilol to increase heart rate and blood pressure. - Carvedilol to 3.25 mg twice daily with low threshold to fully stop. -Continue 5 mg Eliquis twice daily      Relevant Medications   carvedilol (COREG) 6.25 MG tablet   Tachycardia-bradycardia syndrome (HCC) (Chronic)  3-day Zio patch monitor showed pretty significant bradycardia, but no pauses.  No indication for pacemaker at this point. Remains 100% in A-fib. -> Further reduce carvedilol to 3.125 mg twice daily and continue to monitor.       Relevant Medications   carvedilol (COREG) 6.25 MG tablet   Venous insufficiency (chronic) (peripheral) with chronic lower extremity edema - Primary (Chronic)   Chronic venous stasis dermatitis and edema well-managed on torsemide. Weight decreased from 260 to 241 pounds, indicating effective fluid management. - Continue current torsemide regimen and adjust based on daily weight measurements. - Administer extra doses of torsemide if weight increases by more than 3 pounds in a day or 5 pounds in a week.      Relevant Medications   carvedilol (COREG) 6.25 MG tablet     Other   Chronic kidney disease, stage 3b (HCC) (Chronic)   Kidney function monitored, creatinine levels at 1.36 and 1.62. Regular blood work emphasized. - Ensure regular blood work to monitor kidney function.      Diabetes mellitus with neuropathy (HCC) (Chronic)   Per PCP      Exertional dyspnea (Chronic)   Likely combination of physical deconditioning, COPD with some mild HFpEF.        Follow-Up: Return in about 6 months (around 04/21/2024).  Total time spent: 21 min spent with patient + 21 min spent charting = 42 min I spent 42 minutes in the care of  Rayquan Albro today including reviewing labs (1 min - no recent [facility] labs), reviewing outside labs from scanned from SNF (3 min -unfortunately, some of the labs that were supposed to send were not sent), face to face time discussing treatment options (21), reviewing records from nursing facility and previous notes (4 minutes), 14 minutes dictating, and documenting in the encounter.    Signed, Marykay Lex, MD, MS Bryan Lemma, M.D., M.S. Interventional Cardiologist  [facility]  Pager # [phone] Phone # [phone] [address] [place], [place] [address]

## 2023-10-21 NOTE — Telephone Encounter (Signed)
 Dr Herbie Baltimore reviewed  results at patient visit 10/21/23 .  Medication   Changes  was only decrease Carvedilol to 3.125 mg twice a day

## 2023-10-21 NOTE — Patient Instructions (Addendum)
 Medication Instructions:  Stop taking Carvedilol 6.25 mg twice a day    Start Carvedilol to 3.125 mg twice a day  *If you need a refill on your cardiac medications before your next appointment, please call your pharmacy*   Lab Work:  Not needed   Testing/Procedures:  Not needed  Follow-Up: At [facility], you and your health needs are our priority.  As part of our continuing mission to provide you with exceptional heart care, we have created designated Provider Care Teams.  These Care Teams include your primary Cardiologist (physician) and Advanced Practice Providers (APPs -  Physician Assistants and Nurse Practitioners) who all work together to provide you with the care you need, when you need it.     Your next appointment:   6 month(s)  The format for your next appointment:   In Person  Provider:   Bryan Lemma, MD    Other Instructions s

## 2023-10-22 NOTE — Telephone Encounter (Signed)
 I do not see BNP.  Daily weights seem to be pretty stable.  He is not having any signs of heart failure.  He probably has a chronically elevated BNP.  Would simply continue current plan with adjusting his diuretic dosing based on weight gain just as has previously been described.  Double dose for weight gain more than 3 pounds in 1 day or more than 5 pounds 1 week.  Based on his weight chart I think he was probably about 255 pounds.  Primus the average of his recent weights on the wait list.  I cannot manage day and day out weights-this is something that needs to be able to be done at the facility level.  Would only check BMP if he is having symptoms of dyspnea, PND or orthopnea.  Bryan Lemma, MD

## 2023-10-23 NOTE — Telephone Encounter (Signed)
 Called and spoke to [employer] provider message below  Tiffany verbalized understanding, no questions at this time

## 2023-10-25 ENCOUNTER — Encounter: Payer: Self-pay | Admitting: Cardiology

## 2023-10-25 DIAGNOSIS — I5032 Chronic diastolic (congestive) heart failure: Secondary | ICD-10-CM

## 2023-10-25 DIAGNOSIS — I110 Hypertensive heart disease with heart failure: Secondary | ICD-10-CM | POA: Diagnosis not present

## 2023-10-25 DIAGNOSIS — I495 Sick sinus syndrome: Secondary | ICD-10-CM

## 2023-10-25 DIAGNOSIS — I4821 Permanent atrial fibrillation: Secondary | ICD-10-CM

## 2023-10-25 NOTE — Assessment & Plan Note (Signed)
 3-day Zio patch monitor showed pretty significant bradycardia, but no pauses.  No indication for pacemaker at this point. Remains 100% in A-fib. -> Further reduce carvedilol to 3.125 mg twice daily and continue to monitor.

## 2023-10-25 NOTE — Assessment & Plan Note (Signed)
 Per PCP

## 2023-10-25 NOTE — Assessment & Plan Note (Signed)
 Kidney function monitored, creatinine levels at 1.36 and 1.62. Regular blood work emphasized. - Ensure regular blood work to monitor kidney function.

## 2023-10-25 NOTE — Assessment & Plan Note (Signed)
 CHA2DS2-VASc score is now 8 following stroke.  Remains on stable dose of Eliquis 5 mg twice daily for combination of stroke prevention but also history of DVT. Would avoid holding Eliquis for prolonged period of time unless she is a significant bleeding issue.  Otherwise for procedures or surgeries would recommend bridging with Lovenox while holding for 2 days preop.

## 2023-10-25 NOTE — Assessment & Plan Note (Signed)
 Longstanding, essentially permanent Afib with bradycardia. Current heart rate slower than desired but not pacemaker level. Adjusting carvedilol to increase heart rate and blood pressure. - Carvedilol to 3.25 mg twice daily with low threshold to fully stop. -Continue 5 mg Eliquis twice daily

## 2023-10-25 NOTE — Assessment & Plan Note (Signed)
 After longstanding issues with hypertension, he is now hypotensive on current meds. Blood pressure well-controlled.  Vasartan dose reduced previously because of low blood pressures.  Further carvedilol reduction planned to slightly increase blood pressure. - Reduce carvedilol to 3.125 mg twice daily and continue valsartan 40 mg daily -Continue current doses of torsemide with sliding scale as noted.

## 2023-10-25 NOTE — Assessment & Plan Note (Signed)
 Likely combination of physical deconditioning, COPD with some mild HFpEF.

## 2023-10-25 NOTE — Assessment & Plan Note (Addendum)
 No real PND orthopnea.  Very difficult to determine if his apnea is related to deconditioning and immobility.  All we have to do is low-dose carvedilol and valsartan at this point along with torsemide.  Limited by renal insufficiency.  Not sure how much help BNP or proBNP levels will be given his renal insufficiency and longstanding disease.  Would be more interested in weight changes and level of dyspnea or edema.  The recorded daily weight range is actually been pretty consistent ensuring that he does take the extra doses of Lasix if he is above his average.  The average weight will need to be adjusted over the course of couple weeks to see if that he stays all stable weight.  He had put on some weight after Nishan losing weight back when he had a stroke.  I think his average weight range in the recordings I saw was between 256 to 288 pounds

## 2023-10-25 NOTE — Assessment & Plan Note (Signed)
 Chronic venous stasis dermatitis and edema well-managed on torsemide. Weight decreased from 260 to 241 pounds, indicating effective fluid management. - Continue current torsemide regimen and adjust based on daily weight measurements. - Administer extra doses of torsemide if weight increases by more than 3 pounds in a day or 5 pounds in a week.

## 2023-10-25 NOTE — Assessment & Plan Note (Signed)
 26 episodes of ventricular runs, longest 18 beats. Not life-threatening, monitored. No immediate treatment changes needed unless episodes increase. Continue low-dose beta-blocker.

## 2023-11-20 ENCOUNTER — Telehealth: Payer: Self-pay | Admitting: Cardiology

## 2023-11-20 NOTE — Telephone Encounter (Signed)
Yes it is okay to stop.

## 2023-11-20 NOTE — Telephone Encounter (Signed)
 Spoke to [employer] NP  Would  like to discontinue carvedilol  3.125 mg twice a day .  Per  Jacqlyn Matas NP, patient has received only 18 dose of carvedilol  since last office visit.   ( About 30 %) . Heart rate has ben running in the 40's- 60's.  Per Jacqlyn Matas NP , Mr Seliga's daughter wanted her to reach out to Dr Addie Holstein before any medication is topped.    Jacqlyn Matas is aware will need to defer to Dr Addie Holstein for a response. He is not in the office until next week. Will contact once a response is given

## 2023-11-20 NOTE — Telephone Encounter (Signed)
 Pt c/o medication issue:  1. Name of Medication: carvedilol  (COREG ) 6.25 MG tablet   2. How are you currently taking this medication (dosage and times per day)?  Take 6.25 mg by mouth 2 (two) times daily with a meal.       3. Are you having a reaction (difficulty breathing--STAT)? No  4. What is your medication issue? She'd like a callback to see if it's ok for her to discontinue this medication. Please advise

## 2023-11-23 NOTE — Telephone Encounter (Addendum)
 RN spoke to [place] NP.  Verbally informed Megan of Dr  Elberta Grebe.   "Yes it is okay to stop"     Jacqlyn Matas voiced understanding

## 2023-12-04 ENCOUNTER — Telehealth: Payer: Self-pay | Admitting: Cardiology

## 2023-12-04 NOTE — Telephone Encounter (Signed)
 Daughter wanted to left Rn know patient has passed - last week\ The funeral was yesterday.

## 2023-12-04 NOTE — Telephone Encounter (Signed)
 Pt daughter called in asking that you call her at your earliest convenience about a personal matter.  She did not wish for me to put the reason in the note.

## 2023-12-20 DEATH — deceased

## 2024-06-30 ENCOUNTER — Ambulatory Visit: Payer: Medicare Other | Admitting: Neurology
